# Patient Record
Sex: Male | Born: 1940
Health system: Southern US, Community
[De-identification: ages and names within clinical notes are randomized; demographics above are authoritative.]

## PROBLEM LIST (undated history)

## (undated) DIAGNOSIS — I341 Nonrheumatic mitral (valve) prolapse: Secondary | ICD-10-CM

## (undated) DIAGNOSIS — Z87898 Personal history of other specified conditions: Secondary | ICD-10-CM

## (undated) DIAGNOSIS — F32A Depression, unspecified: Secondary | ICD-10-CM

## (undated) DIAGNOSIS — L732 Hidradenitis suppurativa: Secondary | ICD-10-CM

## (undated) DIAGNOSIS — Z973 Presence of spectacles and contact lenses: Secondary | ICD-10-CM

## (undated) DIAGNOSIS — K219 Gastro-esophageal reflux disease without esophagitis: Secondary | ICD-10-CM

## (undated) DIAGNOSIS — Z87438 Personal history of other diseases of male genital organs: Secondary | ICD-10-CM

## (undated) DIAGNOSIS — H269 Unspecified cataract: Secondary | ICD-10-CM

## (undated) DIAGNOSIS — D126 Benign neoplasm of colon, unspecified: Secondary | ICD-10-CM

## (undated) DIAGNOSIS — K589 Irritable bowel syndrome without diarrhea: Secondary | ICD-10-CM

## (undated) DIAGNOSIS — J189 Pneumonia, unspecified organism: Secondary | ICD-10-CM

## (undated) DIAGNOSIS — Z972 Presence of dental prosthetic device (complete) (partial): Secondary | ICD-10-CM

## (undated) DIAGNOSIS — I872 Venous insufficiency (chronic) (peripheral): Secondary | ICD-10-CM

## (undated) DIAGNOSIS — I050 Rheumatic mitral stenosis: Secondary | ICD-10-CM

## (undated) DIAGNOSIS — C801 Malignant (primary) neoplasm, unspecified: Secondary | ICD-10-CM

## (undated) DIAGNOSIS — L24A9 Irritant contact dermatitis due friction or contact with other specified body fluids: Secondary | ICD-10-CM

## (undated) DIAGNOSIS — E119 Type 2 diabetes mellitus without complications: Secondary | ICD-10-CM

## (undated) DIAGNOSIS — E669 Obesity, unspecified: Secondary | ICD-10-CM

## (undated) DIAGNOSIS — G4730 Sleep apnea, unspecified: Secondary | ICD-10-CM

## (undated) DIAGNOSIS — K5730 Diverticulosis of large intestine without perforation or abscess without bleeding: Secondary | ICD-10-CM

## (undated) DIAGNOSIS — R519 Headache, unspecified: Secondary | ICD-10-CM

## (undated) DIAGNOSIS — M869 Osteomyelitis, unspecified: Secondary | ICD-10-CM

## (undated) DIAGNOSIS — M1990 Unspecified osteoarthritis, unspecified site: Secondary | ICD-10-CM

## (undated) DIAGNOSIS — F419 Anxiety disorder, unspecified: Secondary | ICD-10-CM

## (undated) DIAGNOSIS — R413 Other amnesia: Secondary | ICD-10-CM

## (undated) DIAGNOSIS — F329 Major depressive disorder, single episode, unspecified: Secondary | ICD-10-CM

## (undated) DIAGNOSIS — J45909 Unspecified asthma, uncomplicated: Secondary | ICD-10-CM

## (undated) DIAGNOSIS — T148XXA Other injury of unspecified body region, initial encounter: Secondary | ICD-10-CM

## (undated) DIAGNOSIS — H409 Unspecified glaucoma: Secondary | ICD-10-CM

## (undated) DIAGNOSIS — E785 Hyperlipidemia, unspecified: Secondary | ICD-10-CM

## (undated) DIAGNOSIS — J449 Chronic obstructive pulmonary disease, unspecified: Secondary | ICD-10-CM

## (undated) HISTORY — DX: Chronic obstructive pulmonary disease, unspecified: J44.9

## (undated) HISTORY — DX: Anxiety disorder, unspecified: F41.9

## (undated) HISTORY — DX: Unspecified asthma, uncomplicated: J45.909

## (undated) HISTORY — DX: Diverticulosis of large intestine without perforation or abscess without bleeding: K57.30

## (undated) HISTORY — DX: Gastro-esophageal reflux disease without esophagitis: K21.9

## (undated) HISTORY — DX: Benign neoplasm of colon, unspecified: D12.6

## (undated) HISTORY — PX: CIRCUMCISION: SHX1350

## (undated) HISTORY — PX: COLON SURGERY: SHX602

## (undated) HISTORY — PX: TONSILLECTOMY: SUR1361

## (undated) HISTORY — DX: Other amnesia: R41.3

## (undated) HISTORY — DX: Hidradenitis suppurativa: L73.2

## (undated) HISTORY — DX: Hyperlipidemia, unspecified: E78.5

## (undated) HISTORY — DX: Venous insufficiency (chronic) (peripheral): I87.2

## (undated) HISTORY — DX: Unspecified glaucoma: H40.9

## (undated) HISTORY — DX: Personal history of other diseases of male genital organs: Z87.438

## (undated) HISTORY — PX: MULTIPLE TOOTH EXTRACTIONS: SHX2053

## (undated) HISTORY — DX: Personal history of other specified conditions: Z87.898

## (undated) HISTORY — DX: Irritable bowel syndrome, unspecified: K58.9

## (undated) HISTORY — PX: KNEE ARTHROSCOPY: SHX127

## (undated) HISTORY — DX: Type 2 diabetes mellitus without complications: E11.9

## (undated) HISTORY — DX: Unspecified osteoarthritis, unspecified site: M19.90

## (undated) HISTORY — DX: Obesity, unspecified: E66.9

---
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
## 2001-04-20 ENCOUNTER — Encounter: Payer: Self-pay | Admitting: Orthopedic Surgery

## 2001-04-20 ENCOUNTER — Ambulatory Visit (HOSPITAL_COMMUNITY): Admission: RE | Admit: 2001-04-20 | Discharge: 2001-04-20 | Payer: Self-pay | Admitting: Orthopedic Surgery

## 2003-11-29 ENCOUNTER — Emergency Department (HOSPITAL_COMMUNITY): Admission: AD | Admit: 2003-11-29 | Discharge: 2003-11-29 | Payer: Self-pay | Admitting: Family Medicine

## 2004-11-10 ENCOUNTER — Ambulatory Visit: Payer: Self-pay | Admitting: Pulmonary Disease

## 2004-12-07 ENCOUNTER — Ambulatory Visit: Payer: Self-pay | Admitting: Professional

## 2004-12-08 ENCOUNTER — Ambulatory Visit: Payer: Self-pay | Admitting: Pulmonary Disease

## 2004-12-14 ENCOUNTER — Ambulatory Visit: Payer: Self-pay | Admitting: Professional

## 2004-12-21 ENCOUNTER — Ambulatory Visit: Payer: Self-pay | Admitting: Professional

## 2004-12-28 ENCOUNTER — Ambulatory Visit: Payer: Self-pay | Admitting: Professional

## 2004-12-30 ENCOUNTER — Ambulatory Visit: Payer: Self-pay | Admitting: Gastroenterology

## 2005-01-04 ENCOUNTER — Ambulatory Visit: Payer: Self-pay | Admitting: Professional

## 2005-01-04 ENCOUNTER — Encounter: Admission: RE | Admit: 2005-01-04 | Discharge: 2005-04-04 | Payer: Self-pay | Admitting: Pulmonary Disease

## 2005-01-11 ENCOUNTER — Ambulatory Visit: Payer: Self-pay | Admitting: Professional

## 2005-01-13 ENCOUNTER — Ambulatory Visit: Payer: Self-pay | Admitting: Gastroenterology

## 2005-01-18 ENCOUNTER — Ambulatory Visit: Payer: Self-pay | Admitting: Professional

## 2005-01-25 ENCOUNTER — Ambulatory Visit: Payer: Self-pay | Admitting: Professional

## 2005-02-02 ENCOUNTER — Ambulatory Visit: Payer: Self-pay | Admitting: Pulmonary Disease

## 2005-02-08 ENCOUNTER — Ambulatory Visit: Payer: Self-pay | Admitting: Professional

## 2005-02-22 ENCOUNTER — Ambulatory Visit: Payer: Self-pay | Admitting: Professional

## 2005-03-08 ENCOUNTER — Ambulatory Visit: Payer: Self-pay | Admitting: Professional

## 2005-04-29 ENCOUNTER — Encounter: Admission: RE | Admit: 2005-04-29 | Discharge: 2005-07-28 | Payer: Self-pay | Admitting: Pulmonary Disease

## 2005-06-03 ENCOUNTER — Ambulatory Visit: Payer: Self-pay | Admitting: Pulmonary Disease

## 2005-09-06 ENCOUNTER — Ambulatory Visit: Payer: Self-pay | Admitting: Pulmonary Disease

## 2005-09-08 ENCOUNTER — Encounter: Admission: RE | Admit: 2005-09-08 | Discharge: 2005-09-08 | Payer: Self-pay | Admitting: Family Medicine

## 2005-12-16 ENCOUNTER — Ambulatory Visit: Payer: Self-pay | Admitting: Pulmonary Disease

## 2005-12-20 ENCOUNTER — Ambulatory Visit: Payer: Self-pay | Admitting: Pulmonary Disease

## 2006-06-10 ENCOUNTER — Ambulatory Visit: Payer: Self-pay | Admitting: Pulmonary Disease

## 2006-11-09 ENCOUNTER — Ambulatory Visit: Payer: Self-pay | Admitting: Pulmonary Disease

## 2007-08-02 ENCOUNTER — Ambulatory Visit: Payer: Self-pay | Admitting: Pulmonary Disease

## 2007-08-10 ENCOUNTER — Ambulatory Visit: Payer: Self-pay | Admitting: Pulmonary Disease

## 2007-08-10 LAB — CONVERTED CEMR LAB
ALT: 38 units/L (ref 0–53)
Alkaline Phosphatase: 36 units/L — ABNORMAL LOW (ref 39–117)
BUN: 14 mg/dL (ref 6–23)
Basophils Relative: 0.5 % (ref 0.0–1.0)
Bilirubin, Direct: 0.2 mg/dL (ref 0.0–0.3)
Calcium: 9.5 mg/dL (ref 8.4–10.5)
Cholesterol: 145 mg/dL (ref 0–200)
Eosinophils Absolute: 0.2 10*3/uL (ref 0.0–0.6)
Eosinophils Relative: 7.7 % — ABNORMAL HIGH (ref 0.0–5.0)
GFR calc Af Amer: 86 mL/min
GFR calc non Af Amer: 71 mL/min
HCT: 39.6 % (ref 39.0–52.0)
Hemoglobin: 13.6 g/dL (ref 13.0–17.0)
Hgb A1c MFr Bld: 6 % (ref 4.6–6.0)
LDL Cholesterol: 88 mg/dL (ref 0–99)
Microalb Creat Ratio: 8.1 mg/g (ref 0.0–30.0)
Microalb, Ur: 1 mg/dL (ref 0.0–1.9)
Neutro Abs: 2.2 10*3/uL (ref 1.4–7.7)
Neutrophils Relative %: 57.6 % (ref 43.0–77.0)
RBC: 4.19 M/uL — ABNORMAL LOW (ref 4.22–5.81)
RDW: 11.5 % (ref 11.5–14.6)
TSH: 1.06 microintl units/mL (ref 0.35–5.50)
Total CHOL/HDL Ratio: 3.1
WBC: 3.7 10*3/uL — ABNORMAL LOW (ref 4.5–10.5)

## 2007-10-24 ENCOUNTER — Ambulatory Visit: Payer: Self-pay | Admitting: Pulmonary Disease

## 2008-01-26 DIAGNOSIS — Z87898 Personal history of other specified conditions: Secondary | ICD-10-CM | POA: Insufficient documentation

## 2008-01-26 DIAGNOSIS — I70209 Unspecified atherosclerosis of native arteries of extremities, unspecified extremity: Secondary | ICD-10-CM

## 2008-01-26 DIAGNOSIS — E785 Hyperlipidemia, unspecified: Secondary | ICD-10-CM | POA: Insufficient documentation

## 2008-01-26 DIAGNOSIS — K589 Irritable bowel syndrome without diarrhea: Secondary | ICD-10-CM

## 2008-01-26 DIAGNOSIS — E1151 Type 2 diabetes mellitus with diabetic peripheral angiopathy without gangrene: Secondary | ICD-10-CM

## 2008-01-26 DIAGNOSIS — I059 Rheumatic mitral valve disease, unspecified: Secondary | ICD-10-CM

## 2008-01-26 DIAGNOSIS — D126 Benign neoplasm of colon, unspecified: Secondary | ICD-10-CM | POA: Insufficient documentation

## 2008-01-26 DIAGNOSIS — I872 Venous insufficiency (chronic) (peripheral): Secondary | ICD-10-CM | POA: Insufficient documentation

## 2008-01-26 DIAGNOSIS — K219 Gastro-esophageal reflux disease without esophagitis: Secondary | ICD-10-CM

## 2008-02-20 ENCOUNTER — Ambulatory Visit: Payer: Self-pay | Admitting: Pulmonary Disease

## 2008-02-20 DIAGNOSIS — M1990 Unspecified osteoarthritis, unspecified site: Secondary | ICD-10-CM | POA: Insufficient documentation

## 2008-02-20 DIAGNOSIS — J329 Chronic sinusitis, unspecified: Secondary | ICD-10-CM | POA: Insufficient documentation

## 2008-02-20 DIAGNOSIS — K5730 Diverticulosis of large intestine without perforation or abscess without bleeding: Secondary | ICD-10-CM | POA: Insufficient documentation

## 2008-04-26 ENCOUNTER — Telehealth: Payer: Self-pay | Admitting: Pulmonary Disease

## 2008-05-21 ENCOUNTER — Emergency Department (HOSPITAL_COMMUNITY): Admission: EM | Admit: 2008-05-21 | Discharge: 2008-05-21 | Payer: Self-pay | Admitting: Emergency Medicine

## 2008-05-23 ENCOUNTER — Telehealth (INDEPENDENT_AMBULATORY_CARE_PROVIDER_SITE_OTHER): Payer: Self-pay | Admitting: *Deleted

## 2008-05-24 ENCOUNTER — Ambulatory Visit: Payer: Self-pay | Admitting: Pulmonary Disease

## 2008-06-14 ENCOUNTER — Ambulatory Visit: Payer: Self-pay | Admitting: Pulmonary Disease

## 2008-08-06 ENCOUNTER — Ambulatory Visit: Payer: Self-pay | Admitting: Pulmonary Disease

## 2008-08-07 ENCOUNTER — Ambulatory Visit: Payer: Self-pay | Admitting: Pulmonary Disease

## 2008-08-07 ENCOUNTER — Encounter: Admission: RE | Admit: 2008-08-07 | Discharge: 2008-08-07 | Payer: Self-pay | Admitting: Pulmonary Disease

## 2008-08-09 ENCOUNTER — Telehealth (INDEPENDENT_AMBULATORY_CARE_PROVIDER_SITE_OTHER): Payer: Self-pay | Admitting: *Deleted

## 2008-08-13 LAB — CONVERTED CEMR LAB
ALT: 38 units/L (ref 0–53)
AST: 27 units/L (ref 0–37)
Albumin: 3.9 g/dL (ref 3.5–5.2)
BUN: 16 mg/dL (ref 6–23)
Basophils Absolute: 0 10*3/uL (ref 0.0–0.1)
Basophils Relative: 0.6 % (ref 0.0–3.0)
CO2: 30 meq/L (ref 19–32)
Calcium: 9.4 mg/dL (ref 8.4–10.5)
Chloride: 108 meq/L (ref 96–112)
Cholesterol: 144 mg/dL (ref 0–200)
Creatinine, Ser: 1.1 mg/dL (ref 0.4–1.5)
Eosinophils Relative: 8.4 % — ABNORMAL HIGH (ref 0.0–5.0)
Glucose, Bld: 144 mg/dL — ABNORMAL HIGH (ref 70–99)
Hemoglobin: 13.9 g/dL (ref 13.0–17.0)
Hgb A1c MFr Bld: 6.1 % — ABNORMAL HIGH (ref 4.6–6.0)
LDL Cholesterol: 83 mg/dL (ref 0–99)
Lymphocytes Relative: 28.5 % (ref 12.0–46.0)
MCHC: 35.6 g/dL (ref 30.0–36.0)
Neutro Abs: 2.3 10*3/uL (ref 1.4–7.7)
PSA: 0.33 ng/mL (ref 0.10–4.00)
RBC: 4.19 M/uL — ABNORMAL LOW (ref 4.22–5.81)
Total Bilirubin: 1.2 mg/dL (ref 0.3–1.2)
Total Protein: 7.2 g/dL (ref 6.0–8.3)
WBC: 4.3 10*3/uL — ABNORMAL LOW (ref 4.5–10.5)

## 2008-09-09 ENCOUNTER — Encounter: Payer: Self-pay | Admitting: Pulmonary Disease

## 2008-09-13 ENCOUNTER — Encounter: Payer: Self-pay | Admitting: Pulmonary Disease

## 2008-10-23 ENCOUNTER — Telehealth (INDEPENDENT_AMBULATORY_CARE_PROVIDER_SITE_OTHER): Payer: Self-pay | Admitting: *Deleted

## 2008-12-04 ENCOUNTER — Telehealth (INDEPENDENT_AMBULATORY_CARE_PROVIDER_SITE_OTHER): Payer: Self-pay | Admitting: *Deleted

## 2008-12-04 ENCOUNTER — Encounter: Payer: Self-pay | Admitting: Pulmonary Disease

## 2009-01-27 ENCOUNTER — Telehealth (INDEPENDENT_AMBULATORY_CARE_PROVIDER_SITE_OTHER): Payer: Self-pay | Admitting: *Deleted

## 2009-01-28 ENCOUNTER — Ambulatory Visit (HOSPITAL_COMMUNITY): Admission: RE | Admit: 2009-01-28 | Discharge: 2009-01-28 | Payer: Self-pay | Admitting: Pulmonary Disease

## 2009-01-28 ENCOUNTER — Ambulatory Visit: Payer: Self-pay | Admitting: Internal Medicine

## 2009-01-28 DIAGNOSIS — L03119 Cellulitis of unspecified part of limb: Secondary | ICD-10-CM

## 2009-01-28 DIAGNOSIS — L02419 Cutaneous abscess of limb, unspecified: Secondary | ICD-10-CM

## 2009-01-29 ENCOUNTER — Encounter: Payer: Self-pay | Admitting: Adult Health

## 2009-01-29 LAB — CONVERTED CEMR LAB
BUN: 11 mg/dL (ref 6–23)
Creatinine, Ser: 1.2 mg/dL (ref 0.4–1.5)
GFR calc non Af Amer: 77.7 mL/min (ref >60)
Glucose, Bld: 147 mg/dL — ABNORMAL HIGH (ref 70–99)
Hgb A1c MFr Bld: 6.9 % — ABNORMAL HIGH (ref 4.6–6.5)

## 2009-02-03 ENCOUNTER — Ambulatory Visit: Payer: Self-pay | Admitting: Pulmonary Disease

## 2009-02-09 DIAGNOSIS — J189 Pneumonia, unspecified organism: Secondary | ICD-10-CM | POA: Insufficient documentation

## 2009-02-09 DIAGNOSIS — N62 Hypertrophy of breast: Secondary | ICD-10-CM

## 2009-02-10 ENCOUNTER — Telehealth: Payer: Self-pay | Admitting: Pulmonary Disease

## 2009-02-25 ENCOUNTER — Encounter (HOSPITAL_BASED_OUTPATIENT_CLINIC_OR_DEPARTMENT_OTHER): Admission: RE | Admit: 2009-02-25 | Discharge: 2009-05-14 | Payer: Self-pay | Admitting: Internal Medicine

## 2009-03-24 ENCOUNTER — Emergency Department (HOSPITAL_COMMUNITY): Admission: EM | Admit: 2009-03-24 | Discharge: 2009-03-24 | Payer: Self-pay | Admitting: Family Medicine

## 2009-04-10 ENCOUNTER — Ambulatory Visit: Payer: Self-pay | Admitting: *Deleted

## 2009-04-16 ENCOUNTER — Ambulatory Visit (HOSPITAL_COMMUNITY): Admission: RE | Admit: 2009-04-16 | Discharge: 2009-04-16 | Payer: Self-pay | Admitting: Internal Medicine

## 2009-05-01 ENCOUNTER — Telehealth (INDEPENDENT_AMBULATORY_CARE_PROVIDER_SITE_OTHER): Payer: Self-pay | Admitting: *Deleted

## 2009-05-15 ENCOUNTER — Encounter (HOSPITAL_BASED_OUTPATIENT_CLINIC_OR_DEPARTMENT_OTHER): Admission: RE | Admit: 2009-05-15 | Discharge: 2009-06-17 | Payer: Self-pay | Admitting: Internal Medicine

## 2009-05-28 ENCOUNTER — Ambulatory Visit: Payer: Self-pay | Admitting: Vascular Surgery

## 2009-05-29 ENCOUNTER — Ambulatory Visit (HOSPITAL_COMMUNITY): Admission: RE | Admit: 2009-05-29 | Discharge: 2009-05-29 | Payer: Self-pay | Admitting: Internal Medicine

## 2009-06-11 ENCOUNTER — Telehealth: Payer: Self-pay | Admitting: Pulmonary Disease

## 2009-06-11 ENCOUNTER — Encounter: Payer: Self-pay | Admitting: Pulmonary Disease

## 2009-06-12 ENCOUNTER — Ambulatory Visit: Payer: Self-pay | Admitting: Pulmonary Disease

## 2009-06-15 HISTORY — PX: LASER ABLATION: SHX1947

## 2009-06-15 HISTORY — PX: TOE AMPUTATION: SHX809

## 2009-06-16 ENCOUNTER — Ambulatory Visit (HOSPITAL_COMMUNITY): Admission: RE | Admit: 2009-06-16 | Discharge: 2009-06-17 | Payer: Self-pay | Admitting: Orthopedic Surgery

## 2009-06-18 ENCOUNTER — Encounter (HOSPITAL_BASED_OUTPATIENT_CLINIC_OR_DEPARTMENT_OTHER): Admission: RE | Admit: 2009-06-18 | Discharge: 2009-09-16 | Payer: Self-pay | Admitting: Internal Medicine

## 2009-06-18 ENCOUNTER — Encounter: Payer: Self-pay | Admitting: Pulmonary Disease

## 2009-07-02 ENCOUNTER — Ambulatory Visit: Payer: Self-pay | Admitting: Vascular Surgery

## 2009-07-07 ENCOUNTER — Telehealth: Payer: Self-pay | Admitting: Pulmonary Disease

## 2009-07-09 ENCOUNTER — Ambulatory Visit: Payer: Self-pay | Admitting: Vascular Surgery

## 2009-07-10 ENCOUNTER — Ambulatory Visit (HOSPITAL_BASED_OUTPATIENT_CLINIC_OR_DEPARTMENT_OTHER): Admission: RE | Admit: 2009-07-10 | Discharge: 2009-07-10 | Payer: Self-pay | Admitting: Urology

## 2009-07-11 ENCOUNTER — Encounter: Payer: Self-pay | Admitting: Pulmonary Disease

## 2009-08-04 ENCOUNTER — Ambulatory Visit: Payer: Self-pay | Admitting: Pulmonary Disease

## 2009-08-05 LAB — CONVERTED CEMR LAB
AST: 25 units/L (ref 0–37)
Albumin: 3.9 g/dL (ref 3.5–5.2)
Alkaline Phosphatase: 46 units/L (ref 39–117)
Bilirubin, Direct: 0.2 mg/dL (ref 0.0–0.3)
CO2: 31 meq/L (ref 19–32)
Calcium: 9.3 mg/dL (ref 8.4–10.5)
GFR calc non Af Amer: 85.77 mL/min (ref >60)
Glucose, Bld: 154 mg/dL — ABNORMAL HIGH (ref 70–99)
Potassium: 4.4 meq/L (ref 3.5–5.1)
Sodium: 141 meq/L (ref 135–145)
Total CHOL/HDL Ratio: 3
Total Protein: 8.2 g/dL (ref 6.0–8.3)

## 2009-08-18 ENCOUNTER — Encounter: Payer: Self-pay | Admitting: Pulmonary Disease

## 2009-09-16 ENCOUNTER — Encounter: Payer: Self-pay | Admitting: Pulmonary Disease

## 2009-09-30 ENCOUNTER — Encounter: Payer: Self-pay | Admitting: Pulmonary Disease

## 2009-12-23 ENCOUNTER — Encounter (INDEPENDENT_AMBULATORY_CARE_PROVIDER_SITE_OTHER): Payer: Self-pay | Admitting: *Deleted

## 2010-01-15 ENCOUNTER — Encounter: Payer: Self-pay | Admitting: Pulmonary Disease

## 2010-01-26 ENCOUNTER — Encounter: Payer: Self-pay | Admitting: Pulmonary Disease

## 2010-01-27 ENCOUNTER — Telehealth: Payer: Self-pay | Admitting: Pulmonary Disease

## 2010-01-28 ENCOUNTER — Telehealth (INDEPENDENT_AMBULATORY_CARE_PROVIDER_SITE_OTHER): Payer: Self-pay | Admitting: *Deleted

## 2010-02-09 ENCOUNTER — Telehealth: Payer: Self-pay | Admitting: Pulmonary Disease

## 2010-02-10 ENCOUNTER — Encounter: Payer: Self-pay | Admitting: Pulmonary Disease

## 2010-02-11 ENCOUNTER — Ambulatory Visit: Payer: Self-pay | Admitting: Pulmonary Disease

## 2010-02-11 ENCOUNTER — Encounter (INDEPENDENT_AMBULATORY_CARE_PROVIDER_SITE_OTHER): Payer: Self-pay | Admitting: *Deleted

## 2010-02-18 LAB — CONVERTED CEMR LAB
AST: 27 units/L (ref 0–37)
Albumin: 4 g/dL (ref 3.5–5.2)
Alkaline Phosphatase: 44 units/L (ref 39–117)
BUN: 12 mg/dL (ref 6–23)
Basophils Absolute: 0 10*3/uL (ref 0.0–0.1)
CO2: 28 meq/L (ref 19–32)
Eosinophils Relative: 6.2 % — ABNORMAL HIGH (ref 0.0–5.0)
GFR calc non Af Amer: 107.95 mL/min (ref >60)
Glucose, Bld: 192 mg/dL — ABNORMAL HIGH (ref 70–99)
Hgb A1c MFr Bld: 7.5 % — ABNORMAL HIGH (ref 4.6–6.5)
Lymphocytes Relative: 24.5 % (ref 12.0–46.0)
Monocytes Relative: 8.5 % (ref 3.0–12.0)
Neutrophils Relative %: 59.9 % (ref 43.0–77.0)
Platelets: 149 10*3/uL — ABNORMAL LOW (ref 150.0–400.0)
Potassium: 3.9 meq/L (ref 3.5–5.1)
RDW: 11.7 % (ref 11.5–14.6)
Sodium: 140 meq/L (ref 135–145)
TSH: 1.31 microintl units/mL (ref 0.35–5.50)
Total Protein: 8 g/dL (ref 6.0–8.3)
WBC: 4.8 10*3/uL (ref 4.5–10.5)

## 2010-03-23 ENCOUNTER — Encounter (INDEPENDENT_AMBULATORY_CARE_PROVIDER_SITE_OTHER): Payer: Self-pay

## 2010-03-24 ENCOUNTER — Ambulatory Visit: Payer: Self-pay | Admitting: Gastroenterology

## 2010-03-24 ENCOUNTER — Encounter (INDEPENDENT_AMBULATORY_CARE_PROVIDER_SITE_OTHER): Payer: Self-pay

## 2010-04-07 ENCOUNTER — Ambulatory Visit: Payer: Self-pay | Admitting: Gastroenterology

## 2010-04-07 LAB — HM COLONOSCOPY

## 2010-04-09 ENCOUNTER — Encounter: Payer: Self-pay | Admitting: Gastroenterology

## 2010-04-30 ENCOUNTER — Telehealth (INDEPENDENT_AMBULATORY_CARE_PROVIDER_SITE_OTHER): Payer: Self-pay | Admitting: *Deleted

## 2010-06-18 ENCOUNTER — Encounter: Payer: Self-pay | Admitting: Pulmonary Disease

## 2010-07-23 ENCOUNTER — Telehealth: Payer: Self-pay | Admitting: Pulmonary Disease

## 2010-08-11 ENCOUNTER — Ambulatory Visit: Payer: Self-pay | Admitting: Pulmonary Disease

## 2010-08-16 LAB — CONVERTED CEMR LAB
AST: 27 units/L (ref 0–37)
Alkaline Phosphatase: 36 units/L — ABNORMAL LOW (ref 39–117)
BUN: 13 mg/dL (ref 6–23)
Bilirubin, Direct: 0.2 mg/dL (ref 0.0–0.3)
Chloride: 104 meq/L (ref 96–112)
GFR calc non Af Amer: 107.79 mL/min (ref >60)
Hgb A1c MFr Bld: 5.8 % (ref 4.6–6.5)
Potassium: 4.4 meq/L (ref 3.5–5.1)
Sodium: 140 meq/L (ref 135–145)
Total Bilirubin: 1.2 mg/dL (ref 0.3–1.2)

## 2010-08-19 ENCOUNTER — Encounter: Payer: Self-pay | Admitting: Pulmonary Disease

## 2010-08-19 ENCOUNTER — Ambulatory Visit: Payer: Self-pay

## 2010-08-19 ENCOUNTER — Ambulatory Visit: Payer: Self-pay | Admitting: Cardiovascular Disease

## 2010-08-19 ENCOUNTER — Ambulatory Visit (HOSPITAL_COMMUNITY): Admission: RE | Admit: 2010-08-19 | Discharge: 2010-08-19 | Payer: Self-pay | Admitting: Pulmonary Disease

## 2010-08-26 ENCOUNTER — Encounter: Payer: Self-pay | Admitting: Pulmonary Disease

## 2010-09-17 ENCOUNTER — Telehealth (INDEPENDENT_AMBULATORY_CARE_PROVIDER_SITE_OTHER): Payer: Self-pay | Admitting: *Deleted

## 2010-09-21 ENCOUNTER — Encounter: Payer: Self-pay | Admitting: Pulmonary Disease

## 2010-12-06 ENCOUNTER — Encounter: Payer: Self-pay | Admitting: Pulmonary Disease

## 2010-12-15 NOTE — Letter (Signed)
Summary: Previsit letter  [facility]  [address] [place], [place] [address]   Phone: [phone]  Fax: [phone]       02/11/2010 MRN: [id number]  [facility] [address] [place], [place]  [address]  Dear [name],  Welcome to the Gastroenterology Division at [facility].    You are scheduled to see a nurse for your pre-procedure visit on 03-24-10 at 11:00a.m. on the 3rd floor at [facility], [address]  We ask that you try to arrive at our office 15 minutes prior to your appointment time to allow for check-in.  Your nurse visit will consist of discussing your medical and surgical history, your immediate family medical history, and your medications.    Please bring a complete list of all your medications or, if you prefer, bring the medication bottles and we will list them.  We will need to be aware of both prescribed and over the counter drugs.  We will need to know exact dosage information as well.  If you are on blood thinners (Coumadin, Plavix, Aggrenox, Ticlid, etc.) please call our office today/prior to your appointment, as we need to consult with your physician about holding your medication.   Please be prepared to read and sign documents such as consent forms, a financial agreement, and acknowledgement forms.  If necessary, and with your consent, a friend or relative is welcome to sit-in on the nurse visit with you.  Please bring your insurance card so that we may make a copy of it.  If your insurance requires a referral to see a specialist, please bring your referral form from your primary care physician.  No co-pay is required for this nurse visit.     If you cannot keep your appointment, please call [phone] to cancel or reschedule prior to your appointment date.  This allows [place] the opportunity to schedule an appointment for another patient in need of care.    Thank you for choosing [facility] for your medical needs.   We appreciate the opportunity to care for you.  Please visit [place] at our website  to learn more about our practice.                     Sincerely.                                                                                                                   The Gastroenterology Division

## 2010-12-15 NOTE — Assessment & Plan Note (Signed)
Summary: rov ///kp   CC:  6 month ROV & review of mult medical problems....  History of Present Illness: 70 y/o BM here for a follow up visit... he has mult med problems as noted below...   ~  Mar10:  he was seen w/ cellulitis left leg recently and cult= MRSA... XRays of left Tib/Fib were neg... treated w/ Keflex & Doxy w/ improvement, but wound edges are undermined and he will need wound center attention... we will give him a Tetanus shot and refer to wound clinic.  ~  Aug10:  4+months of intensive Rx thru wound care clinic w/ hyperbaric O2 Rx etc (notes reviewed from [facility])... right foot second toe osteomyelitis- amputation by Volney Presser 8/2... and s/p laser ablation of left greater saphenous vein by DrEarly 8/25... also had phimosis w/ circ 8/26 by DrTannenbaum...  ~  Sep10:  we reviewed all of his surgeries in Aug- he notes much improved now... awaiting special DM shoes... feeling well now- no new complaints or concerns...   ~  February 11, 2010:  he reports a good 51mo- had f/u DrTannenbaum- s/p circ, BPH, ED, norm PSA;  and DrShapiro- no retinopathy...  still having trouble w/ diet, exercise, and weight...  BS not well controlled (BS=192, A1c=7.5) and we decided to add Glimep 1mg /d...    Current Problem List:  Hx of SINUSITIS (ICD-473.9)  Hx Pneumonia - seen in ER 7/09 w/ faint RLL infiltrate that cleared serially w/ antibiotic Rx...  MITRAL VALVE PROLAPSE (ICD-424.0) - on ASA 81mg /d... he denies CP, palpit, dyspnea... exam w/ MR murmur... we discussed f/u 2DEcho.  ~  2DEcho 5/01 showed mild ant leaflet MVP, mild MR, norm LVF...  VENOUS INSUFFICIENCY (ICD-459.81) - he knows to elim salt, elevate legs, wear TED's...  ~  neg ven dopplers in 1996...   ~  he saw DrLawson in 2000 for a ven stasis ulcer on his leg... rec for TED's, no salt, elevation, etc...   ~  he had normal ABI's in 10/06...  ~  3/10:  left leg wound w/ cellulitis & +MRSA... Rx'd and refer back to wound care clinic.  ~   7/10:  sched for right second toe amputation due to osteomyelitis despite hyperbaric O2 etc...  ~  8/10: s/p right second toe amp for osteomyelitis Lestine Box), and s/p laser ablation of left greater saphenous vein (DrEarly).  HYPERCHOLESTEROLEMIA (ICD-272.0) - changed to SIMVASTATIN 80mg /d last yr... due for f/u FLP.  ~  FLP 9/08 showed TChol 145, TG 54, HDL 46, LDL 88  ~  FLP 9/09 showed TChol 144, Tg 65, HDL 48, LDL 83  ~  FLP 9/10 showed TChol 146, TG 52, HDL 51, LDL 85  DIABETES MELLITUS (ICD-250.00) - on METFORMIN 500mg  Bid + diet...  ~  labs 9/08 showed BS=123, HgA1c=6.0.Marland Kitchen. urine microalb was neg...  ~  labs 9/09 showed BS= 144, HgA1c= 6.1.Marland Kitchen. rec> same med, better diet.  ~  labs 3/10 showed BS= 147, HgA1c= 6.9.Marland Kitchen. rec> incr the MetformBid, but he never did.  ~  7/10: he states the wound care clinic increased his glucose intake due to Hyperbaric O2 Rx... we increased his Metform500 Bid.  ~  labs 9/10 on Metform500Bid showed BS= 154, A1c= 6.4  ~  labs 3/11 on Metform500Bid showed BS= 192, A1c= 7.5.Marland Kitchen. rec> add Glimep 1mg /d.  OBESITY (ICD-278.00) - he is very large at 6\' 8"  tall and >300# for the last 10+ yrs... peak = 340#, currently 333#... he drinks beer daily... discussed diet +  exercise program (and no beer)...  ~  he had transient right gynecomastia 9/09, no discharge, neg mammogram & resolved on it's own...  GERD (ICD-530.81)  DIVERTICULOSIS OF COLON (ICD-562.10) / IRRITABLE BOWEL SYNDROME (ICD-564.1) & COLONIC POLYPS (ICD-211.3) -  - last colonoscopy was 3/06 by DrStark showing divertics, & 2-25mm polyps (adenomas)... f/u planned 73yrs, due now...  BENIGN PROSTATIC HYPERTROPHY, HX OF (ICD-V13.8) - eval by DrTannenbaum 7/08 and Rx w/ Cialis... he has tried the PEP shots... + fam hx prostate ca in his father...  ~  8/10:  DrTannenbaum did circ for phimosis, pt states that PSA check was OK...  DEGENERATIVE JOINT DISEASE (ICD-715.90) - he uses OTC meds Prn, and was rec to start Vit  supplements by DrTannenbaum- takes MVI, VitC, VitD... he has seen DrHilts in the past...  Hx of HIDRADENITIS (ICD-705.83)  Health Maintenance - up to date on colon & prostate screens... PNEUMOVAX 9/09... TETANUS booster 3/10... gets yearly Flu vaccines in Fall.   Allergies (verified): No Known Drug Allergies  Comments:  Nurse/Medical Assistant: The patient's medications and allergies were reviewed with the patient and were updated in the Medication and Allergy Lists.  Past History:  Past Medical History:  Hx of SINUSITIS (ICD-473.9) Hx of PNEUMONIA (ICD-486) MITRAL VALVE PROLAPSE (ICD-424.0) VENOUS INSUFFICIENCY (ICD-459.81) HYPERCHOLESTEROLEMIA (ICD-272.0) DIABETES MELLITUS (ICD-250.00) OBESITY (ICD-278.00) GERD (ICD-530.81) DIVERTICULOSIS OF COLON (ICD-562.10) IRRITABLE BOWEL SYNDROME (ICD-564.1) COLONIC POLYPS (ICD-211.3) BENIGN PROSTATIC HYPERTROPHY, HX OF (ICD-V13.8) Hx of GYNECOMASTIA, UNILATERAL (ICD-611.1) DEGENERATIVE JOINT DISEASE (ICD-715.90) Hx of HIDRADENITIS (ICD-705.83)  Past Surgical History: S/P knee arthroscopy S/P right foot second toe amputation by DrBednarz 8/10 S/P laser ablation of left greater saphenous vein by DrEarly 8/10 S/P circ for phimosis 8/10 by DrTannenbaum  Family History: Reviewed history from 06/12/2009 and no changes required. mother alive age 79 father deceased age 12 from stroke, DM  hx of prostate cancer 1 sibling deceased age 55 from brain tumor  Social History: Reviewed history from 06/12/2009 and no changes required. married 2 children quit smoking 1973  smoked for 12 years no caffeine use alcohol use---beer  Review of Systems      See HPI       The patient complains of dyspnea on exertion, peripheral edema, and difficulty walking.  The patient denies anorexia, fever, weight loss, weight gain, vision loss, decreased hearing, hoarseness, chest pain, syncope, prolonged cough, headaches, hemoptysis, abdominal pain,  melena, hematochezia, severe indigestion/heartburn, hematuria, incontinence, muscle weakness, suspicious skin lesions, transient blindness, depression, unusual weight change, abnormal bleeding, enlarged lymph nodes, and angioedema.    Vital Signs:  Patient profile:   71 year old male Height:      80 inches Weight:      333.25 pounds BMI:     36.74 O2 Sat:      96 % on Room air Temp:     96.7 degrees F oral Pulse rate:   75 / minute BP sitting:   110 / 72  (right arm) Cuff size:   regular  Vitals Entered By: Randell Loop CMA (February 11, 2010 10:45 AM)  O2 Sat at Rest %:  96 O2 Flow:  Room air CC: 6 month ROV & review of mult medical problems... Is Patient Diabetic? Yes Pain Assessment Patient in pain? no      Comments meds updated today   Physical Exam  Additional Exam:  WD, Overweight, very large 70 y/o BM in NAD... GENERAL:  Alert & oriented; pleasant & cooperative. HEENT:  [place]/AT, EOM-wnl, PERRLA, EACs-clear, TMs-wnl,  NOSE-clear, THROAT-clear & wnl. NECK:  Supple w/ fairROM; no JVD; normal carotid impulses w/o bruits; no thyromegaly or nodules palpated; no lymphadenopathy. CHEST:  Clear to P & A; without wheezes/ rales/ or rhonchi heard... HEART:  Regular Rhythm; gr 1/6 SEM without rubs or gallops detected... ABDOMEN:  Soft & nontender; normal bowel sounds; no organomegaly or masses detected. EXT: without deformities, mod arthritic changes; wearing TEDs +varicose veins/ +venous insuffic/ chr 1+ edema... NEURO:  CN's intact;  no focal neuro deficits...    MISC. Report  Procedure date:  02/11/2010  Findings:      BMP (METABOL)   Sodium                    140 mEq/L                   135-145   Potassium                 3.9 mEq/L                   3.5-5.1   Chloride                  103 mEq/L                   96-112   Carbon Dioxide            28 mEq/L                    19-32   Glucose              [H]  192 mg/dL                   16-10   BUN                       12  mg/dL                    9-60   Creatinine                0.9 mg/dL                   4.5-4.0   Calcium                   9.6 mg/dL                   9.8-11.9   GFR                       107.95 mL/min               >60  Hepatic/Liver Function Panel (HEPATIC)   Total Bilirubin           0.9 mg/dL                   1.4-7.8   Direct Bilirubin          0.1 mg/dL                   2.9-5.6   Alkaline Phosphatase      44 U/L                      39-117   AST  27 U/L                      0-37   ALT                       32 U/L                      0-53   Total Protein             8.0 g/dL                    1.6-1.0   Albumin                   4.0 g/dL                    9.6-0.4  CBC Platelet w/Diff (CBCD)   White Cell Count          4.8 K/uL                    4.5-10.5   Red Cell Count            4.27 Mil/uL                 4.22-5.81   Hemoglobin                13.4 g/dL                   54.0-98.1   Hematocrit                40.9 %                      39.0-52.0   MCV                       95.6 fl                     78.0-100.0   Platelet Count       [L]  149.0 K/uL                  150.0-400.0   Neutrophil %              59.9 %                      43.0-77.0   Lymphocyte %              24.5 %                      12.0-46.0   Monocyte %                8.5 %                       3.0-12.0   Eosinophils%         [H]  6.2 %                       0.0-5.0   Basophils %               0.9 %                       0.0-3.0  TSH (  TSH)   FastTSH                   1.31 uIU/mL                 0.35-5.50  Hemoglobin A1C (A1C)   Hemoglobin A1C       [H]  7.5 %      Impression & Recommendations:  Problem # 1:  CELLULITIS, LEG, LEFT (ICD-682.6) He has chr ven insuffic, hx cellulitis and osteomyelitis>>> s/p right 2nd toe amputation/ hyperbaric Rx/ etc...  Problem # 2:  DIABETES MELLITUS (ICD-250.00) A1c is up to 7.5 & we decided to add Glimep 1mg  Qam... His updated medication  list for this problem includes:    Adult Aspirin Low Strength 81 Mg Tbdp (Aspirin) .Marland Kitchen... Take 1 tablet by mouth once a day    Metformin Hcl 500 Mg Tabs (Metformin hcl) .Marland Kitchen... Take 1 tab by mouth two times a day    Glimepiride 1 Mg Tabs (Glimepiride) .Marland Kitchen... Take 1 tablet by mouth every morning  Orders: TLB-Hepatic/Liver Function Pnl (80076-HEPATIC) TLB-CBC Platelet - w/Differential (85025-CBCD) TLB-TSH (Thyroid Stimulating Hormone) (84443-TSH) TLB-A1C / Hgb A1C (Glycohemoglobin) (83036-A1C)  Problem # 3:  HYPERCHOLESTEROLEMIA (ICD-272.0) Not fasting today- but FLPs have looked OK, needs better diet & get weight down. His updated medication list for this problem includes:    Simvastatin 80 Mg Tabs (Simvastatin) .Marland Kitchen... Take 1 tab by mouth at bedtime  Problem # 4:  OBESITY (ICD-278.00) We discussed diet/ exercise/ weight reduction strategies...  Problem # 5:  GERD (ICD-530.81) GI is stable-  continue same meds.  Problem # 6:  BENIGN PROSTATIC HYPERTROPHY, HX OF (ICD-V13.8) GU stable-  followed by DrTannenbaum...  Problem # 7:  OTHER MEDICAL PROBLEMS AS NOTED>>>  Complete Medication List: 1)  Adult Aspirin Low Strength 81 Mg Tbdp (Aspirin) .... Take 1 tablet by mouth once a day 2)  Simvastatin 80 Mg Tabs (Simvastatin) .... Take 1 tab by mouth at bedtime 3)  Metformin Hcl 500 Mg Tabs (Metformin hcl) .... Take 1 tab by mouth two times a day 4)  Calcium 500 Mg Tabs (Calcium) .... Take 1 tablet by mouth once a day 5)  Fluocinonide-e 0.05 % Crea (Fluocinonide emulsified base) .... Apply to affected area as needed 6)  Vitamin D 400 Unit Tabs (Cholecalciferol) .... Take 1 tablet by mouth once a day 7)  Glimepiride 1 Mg Tabs (Glimepiride) .... Take 1 tablet by mouth every morning  Other Orders: Prescription Created Electronically [phone]) Gastroenterology Referral (GI) TLB-BMP (Basic Metabolic Panel-BMET) (80048-METABOL)  Patient Instructions: 1)  Today we updated your med list- see  below.... 2)  We refilled your meds for 2011... 3)  Today we also did your non-fasting blood work...  please call the "phone tree" in a few days for your lab results.Marland KitchenMarland Kitchen 4)  Good luck w/ the new diet & exercise program... 5)  Call for any problems.Marland KitchenMarland Kitchen 6)  Please schedule a follow-up appointment in 6 months. Prescriptions: FLUOCINONIDE-E 0.05 % CREA (FLUOCINONIDE EMULSIFIED BASE) apply to affected area as needed  #60gm x prn   Entered and Authorized by:   Michele Mcalpine MD   Signed by:   Michele Mcalpine MD on 02/11/2010   Method used:   Print then Give to Patient   RxID:   [id number] METFORMIN HCL 500 MG  TABS (METFORMIN HCL) take 1 tab by mouth two times a day  #180 x 4   Entered and Authorized by:   Lorin Picket  Elayne Snare MD   Signed by:   Michele Mcalpine MD on 02/11/2010   Method used:   Print then Give to Patient   RxID:   [id number] SIMVASTATIN 80 MG  TABS (SIMVASTATIN) take 1 tab by mouth at bedtime  #90 x 4   Entered and Authorized by:   Michele Mcalpine MD   Signed by:   Michele Mcalpine MD on 02/11/2010   Method used:   Print then Give to Patient   RxID:   [id number]    Immunization History:  Influenza Immunization History:    Influenza:  historical (08/26/2009)

## 2010-12-15 NOTE — Miscellaneous (Signed)
Summary: Lec previsit  Clinical Lists Changes  Medications: Added new medication of MOVIPREP 100 GM  SOLR (PEG-KCL-NACL-NASULF-NA ASC-C) As per prep instructions. - Signed Rx of MOVIPREP 100 GM  SOLR (PEG-KCL-NACL-NASULF-NA ASC-C) As per prep instructions.;  #1 x 0;  Signed;  Entered by: Ulis Rias RN;  Authorized by: Meryl Dare MD [facility];  Method used: Electronically to [facility] [address] [phone]*, [address] [place], [place]  [address], Ph: [phone], Fax: [phone] Observations: Added new observation of NKA: T (03/24/2010 10:46)    Prescriptions: MOVIPREP 100 GM  SOLR (PEG-KCL-NACL-NASULF-NA ASC-C) As per prep instructions.  #1 x 0   Entered by:   Ulis Rias RN   Authorized by:   Meryl Dare MD [facility]   Signed by:   Ulis Rias RN on 03/24/2010   Method used:   Electronically to        [facility] [address] [phone]* (retail)       [address]       [place], [place]  [address]       Ph: [phone]       Fax: [phone]   RxID:   [id number]

## 2010-12-15 NOTE — Medication Information (Signed)
Summary: Metformin/[facility]  Metformin/[facility]   Imported By: Sherian Rein 02/16/2010 11:02:15  _____________________________________________________________________  External Attachment:    Type:   Image     Comment:   External Document

## 2010-12-15 NOTE — Procedures (Signed)
Summary: Colonoscopy  Patient: Jason [name] Note: All result statuses are Final unless otherwise noted.  Tests: (1) Colonoscopy (COL)   COL Colonoscopy           DONE     [facility]     [address]     [place], [place]  [address]           COLONOSCOPY PROCEDURE REPORT           PATIENT:  [name], [name]  MR#:  [id number]     BIRTHDATE:  05-07-1941, 69 yrs. old  GENDER:  male     ENDOSCOPIST:  Judie Petit T. Russella Dar, MD, [facility]           PROCEDURE DATE:  04/07/2010     PROCEDURE:  Colonoscopy with biopsy     ASA CLASS:  Class II     INDICATIONS:  1) follow-up of polyp  2) surveillance and high-risk     screening, adenomatous polyp, 01/2005.     MEDICATIONS:   Fentanyl 75 mcg IV, Versed 6 mg IV     DESCRIPTION OF PROCEDURE:   After the risks benefits and     alternatives of the procedure were thoroughly explained, informed     consent was obtained.  Digital rectal exam was performed and     revealed no abnormalities.   The LB [id number] endoscope     was introduced through the anus and advanced to the cecum, which     was identified by both the appendix and ileocecal valve, without     limitations.  The quality of the prep was good, using MoviPrep.     The instrument was then slowly withdrawn as the colon was fully     examined.     <<PROCEDUREIMAGES>>     FINDINGS:  Mild diverticulosis was found in the sigmoid colon.  A     sessile polyp was found in the descending colon. It was 4 mm in     size. The polyp was removed using cold biopsy forceps.  A normal     appearing cecum, ileocecal valve, and appendiceal orifice were     identified. The ascending, hepatic flexure, transverse, splenic     flexure, and rectum appeared unremarkable. Retroflexed views in     the rectum revealed no abnormalities. The time to cecum =  3.25     minutes. The scope was then withdrawn (time =  10.75  min) from the     patient and the procedure completed.     COMPLICATIONS:  None         ENDOSCOPIC IMPRESSION:     1) Mild diverticulosis in the sigmoid colon     2) 4 mm sessile polyp in the descending colon     RECOMMENDATIONS:     1) Await pathology results     2) High fiber diet with liberal fluid intake.     3) Repeat Colonoscopy in 5 years pending pathology review           Jackelin Correia T. Russella Dar, MD, [facility]           CC: Michele Mcalpine, MD           n.     Rosalie DoctorVenita Lick. Elleni Mozingo at 04/07/2010 08:58 AM           Rayford Halsted, [id number]  Note: An exclamation mark (!) indicates a result that was not dispersed into the flowsheet. Document Creation Date:  04/07/2010 8:59 AM _______________________________________________________________________  (1) Order result status: Final Collection or observation date-time: 04/07/2010 08:54 Requested date-time:  Receipt date-time:  Reported date-time:  Referring Physician:   Ordering Physician: Claudette Head [phone]) Specimen Source:  Source: Launa Grill Order Number: [id number] Lab site:   Appended Document: Colonoscopy     Procedures Next Due Date:    Colonoscopy: 03/2015

## 2010-12-15 NOTE — Letter (Signed)
Summary: Colonoscopy Letter  [facility]  [address] [place], [place] [address]   Phone: [phone]  Fax: [phone]      December 23, 2009 MRN: [id number]   Jason [name] [address] [place], [place]  [address]   Dear Jason [name],   According to your medical record, it is time for you to schedule a Colonoscopy. The American Cancer Society recommends this procedure as a method to detect early colon cancer. Patients with a family history of colon cancer, or a personal history of colon polyps or inflammatory bowel disease are at increased risk.  This letter has beeen generated based on the recommendations made at the time of your procedure. If you feel that in your particular situation this may no longer apply, please contact our office.  Please call our office at [phone] to schedule this appointment or to update your records at your earliest convenience.  Thank you for cooperating with [place] to provide you with the very best care possible.   Sincerely,  Judie Petit T. Russella Dar, M.D.  [facility] [phone]

## 2010-12-15 NOTE — Progress Notes (Signed)
Summary: samples  Phone Note Call from Patient Call back at [phone] cell   Caller: Patient Call For: nadel Reason for Call: Talk to Nurse Summary of Call: pt out of his diabetic test strips - wants to know if we have any samples he can get?   Initial call taken by: Eugene Gavia,  July 23, 2010 9:43 AM  Follow-up for Phone Call        no samples of glucose test strips in the office.  called spoke with patient, advised of no samples.  pt requests a refill, with a greater quantity than what he has been getting stating that he sometimes check his BS more than once daily.  onetouch test strips and lancets sent [facility] per pt's request.  patient also states that he is taking simvastatin 80mg , and was informed that he should no longer take the 80mg .  would like to know if he may cut this in half.  please advise, thanks! Follow-up by: Boone Master CNA/MA,  July 23, 2010 10:23 AM  Additional Follow-up for Phone Call Additional follow up Details #1::        per SN---medicare will only cover for non insulin dependant pts to check BS once daily.  the rx that was sent to [facility] has been cancelled for #100 and changed to #30 for the test strips.  it is ok for him to decrease the simvastatin 80mg  to 1/2 tab daily.  this has been changed in the med list.  lmomtcb for pt Randell Loop CMA  July 23, 2010 3:31 PM     New/Updated Medications: SIMVASTATIN 80 MG  TABS (SIMVASTATIN) take 1/2  tab by mouth at bedtime ONETOUCH ULTRA BLUE  STRP (GLUCOSE BLOOD) use as directed to check blood sugar ONETOUCH LANCETS  MISC (LANCETS) use as directed to check blood sugar Prescriptions: ONETOUCH ULTRA BLUE  STRP (GLUCOSE BLOOD) use as directed to check blood sugar  #30 x 11   Entered by:   Randell Loop CMA   Authorized by:   Michele Mcalpine MD   Signed by:   Randell Loop CMA on 07/23/2010   Method used:   [facility] [id number]       [address]       [place], [place]  [address]       Ph: [phone] or [phone]       Fax: [phone]   RxID:   [id number] [facility] LANCETS  MISC (LANCETS) use as directed to check blood sugar  #100 x PRN   Entered by:   Boone Master CNA/MA   Authorized by:   Michele Mcalpine MD   Signed by:   Boone Master CNA/MA on 07/23/2010   Method used:   [facility]        [id number]       [address]       [place], [place]  [address]       Ph: [phone] or [phone]       Fax: [phone]   RxID:   [id number] ONETOUCH ULTRA BLUE  STRP (GLUCOSE BLOOD) use as directed to check blood sugar  #100 x PRN   Entered by:   Boone Master CNA/MA   Authorized by:   Michele Mcalpine MD   Signed by:  Boone Master CNA/MA on 07/23/2010   Method used:   Electronically to        [facility] [address] [phone]* (retail)       [address]       [place], [place]  [address]       Ph: [phone]       Fax: [phone]   RxID:   [id number]  rx for test strips that were sent to the [facility] [address] has been [facility] [address]  called and spoke with pt and he is aware of rx for #30 and that it is ok to take the 1/2 tab of simvastatin 80mg . Randell Loop CMA  July 23, 2010 3:38 PM

## 2010-12-15 NOTE — Progress Notes (Signed)
Summary: note    Phone Note Call from Patient Call back at [phone]   Caller: Patient Call For: nadel Reason for Call: Talk to Nurse Summary of Call: want to go thru a study program of Metformin thru [employer] ?  -  It's a study for people taking 500mg  a day of Metformin.  Need a statement from SN that it's ok. Initial call taken by: Eugene Gavia,  January 27, 2010 1:59 PM  Follow-up for Phone Call        [facility].  Just need to know if he needs a written letter vs typed letter. Does he want to pick it up or [place] mail it to him or to the study program?  Arman Filter LPN  January 27, 2010 2:19 PM   Pt states he wants to participate in a study by comapny named [employer] for pt on Metformin. He needs a letter of clearance from SN stating that it is ok for pt to participate in study. Pt will pick up letter once ready. please advise. Carron Curie CMA  January 27, 2010 2:27 PM   Additional Follow-up for Phone Call Additional follow up Details #1::        per SN---SN will not write the letter until he knows exactly what the study is.  he will need to bring the info for SN to review. thanks Randell Loop CMA  January 27, 2010 4:02 PM     Additional Follow-up for Phone Call Additional follow up Details #2::    [email or web address].  Aundra Millet Reynolds LPN  January 27, 2010 4:06 PM   pt advised need information on study. he staets he will have info packet dropped off here for SN to review. Carron Curie CMA  January 27, 2010 4:11 PM

## 2010-12-15 NOTE — Letter (Signed)
Summary: [facility]  [facility]   Imported By: Lester [place] 01/26/2010 09:30:11  _____________________________________________________________________  External Attachment:    Type:   Image     Comment:   External Document

## 2010-12-15 NOTE — Letter (Signed)
Summary: Patient Notice- Polyp Results  [facility]  [address] [place], [place] [address]   Phone: [phone]  Fax: [phone]        Apr 09, 2010 MRN: [id number]    JADIEN LEHIGH [address] [place], [place]  [address]    Dear Mr. GODSEY,  I am pleased to inform you that the colon polyp(s) removed during your recent colonoscopy was (were) found to be benign (no cancer detected) upon pathologic examination.  I recommend you have a repeat colonoscopy examination in 5 years to look for recurrent polyps, as having colon polyps increases your risk for having recurrent polyps or even colon cancer in the future.  Should you develop new or worsening symptoms of abdominal pain, bowel habit changes or bleeding from the rectum or bowels, please schedule an evaluation with either your primary care physician or with me.  Continue treatment plan as outlined the day of your exam.  Please call us if you are having persistent problems or have questions about your condition that have not been fully answered at this time.  Sincerely,  Meryl Dare MD [facility]  This letter has been electronically signed by your physician.  Appended Document: Patient Notice- Polyp Results letter mailed.

## 2010-12-15 NOTE — Letter (Signed)
Summary: Diabetic Instructions  [facility]  [address] [place], [place] [address]   Phone: [phone]  Fax: [phone]    SUEO CULLEN [birth year] MRN: [id number]   _  _   ORAL DIABETIC MEDICATION INSTRUCTIONS  The day before your procedure:   Take your diabetic pill as you do normally  The day of your procedure:   Do not take your diabetic pill    We will check your blood sugar levels during the admission process and again in Recovery before discharging you home  ________________________________________________________________________  _  _   INSULIN (LONG ACTING) MEDICATION INSTRUCTIONS (Lantus, NPH, 70/30, Humulin, Novolin-N)   The day before your procedure:   Take  your regular evening dose    The day of your procedure:   Do not take your morning dose    _  _   INSULIN (SHORT ACTING) MEDICATION INSTRUCTIONS (Regular, Humulog, Novolog)   The day before your procedure:   Do not take your evening dose   The day of your procedure:   Do not take your morning dose   _  _   INSULIN PUMP MEDICATION INSTRUCTIONS  We will contact the physician managing your diabetic care for written dosage instructions for the day before your procedure and the day of your procedure.  Once we have received the instructions, we will contact you.

## 2010-12-15 NOTE — Medication Information (Signed)
Summary: [facility]   Imported By: Lester [place] 09/25/2010 07:58:38  _____________________________________________________________________  External Attachment:    Type:   Image     Comment:   External Document

## 2010-12-15 NOTE — Progress Notes (Signed)
Summary: fax coming/ supplies  Phone Note From Pharmacy   Caller: mary w/ [facility] Call For: nadel  Summary of Call: caller is faxing a request for diabetics shoes for pt. mary# [phone] Initial call taken by: Tivis Ringer, CNA,  September 17, 2010 8:25 AM  Follow-up for Phone Call        OK- noted by triage.  Vernie Murders  September 17, 2010 9:28 AM

## 2010-12-15 NOTE — Medication Information (Signed)
Summary: Simvastatin/[facility]  Simvastatin/[facility]   Imported By: Sherian Rein 06/23/2010 14:21:00  _____________________________________________________________________  External Attachment:    Type:   Image     Comment:   External Document

## 2010-12-15 NOTE — Progress Notes (Signed)
Summary: study  Phone Note Call from Patient   Caller: Patient Call For: nadel Summary of Call: pt states he does not have info for the study but it can be obtain online. Initial call taken by: Rickard Patience,  January 28, 2010 9:36 AM  Follow-up for Phone Call        pt states he was not given any paperwork just web address for info about the study--the web address is [email or web address]  Philipp Deputy [facility]  January 28, 2010 10:28 AM   Additional Follow-up for Phone Call Additional follow up Details #1::        per SN---he will need some type of form and info to look over before he can say yes to this study.  if pt can get anything like this he will then need an ov with SN to discuss.  SN can not give his ok for this study any other way.  thanks Randell Loop CMA  January 28, 2010 12:20 PM     Additional Follow-up for Phone Call Additional follow up Details #2::    pt aware he will have to get some kind of paperwork so sn is aware of exactly what this program is and how it works--pt fine with this and will contact us once he gets this info Follow-up by: Philipp Deputy CMA,  January 28, 2010 1:41 PM

## 2010-12-15 NOTE — Letter (Signed)
Summary: Jason [name]  [facility]   Imported By: Sherian Rein 02/04/2010 11:32:51  _____________________________________________________________________  External Attachment:    Type:   Image     Comment:   External Document

## 2010-12-15 NOTE — Medication Information (Signed)
Summary: Di[facility]lies / [facility]  [facility] / [facility]   Imported By: Lennie Odor 09/03/2010 14:43:35  _____________________________________________________________________  External Attachment:    Type:   Image     Comment:   External Document

## 2010-12-15 NOTE — Progress Notes (Signed)
Summary: rx refills- out  Phone Note Call from Patient   Caller: Patient Call For: nadel Summary of Call: pt contacted his mailorder for refills of simvistatin and metformin. was told his rx's had run out. needs asap- is out of metformin now. [facility] . call pt on cell [phone] Initial call taken by: Tivis Ringer, CNA,  February 09, 2010 9:54 AM  Follow-up for Phone Call        refill sent. pt advised he needs to schedule follow-up appt per last ov note. Pt transferred to schedulers to set up appt. Carron Curie CMA  February 09, 2010 11:42 AM     Prescriptions: METFORMIN HCL 500 MG  TABS (METFORMIN HCL) take 1 tab by mouth two times a day  #180 x 1   Entered by:   Carron Curie CMA   Authorized by:   Michele Mcalpine MD   Signed by:   Carron Curie CMA on 02/09/2010   Method used:   Faxed to ...       [facility] ([facility]order)       [address] [place], [place]         Ph: [phone]       Fax: [phone]   RxID:   [id number] SIMVASTATIN 80 MG  TABS (SIMVASTATIN) take 1 tab by mouth at bedtime  #90 x 1   Entered by:   Carron Curie CMA   Authorized by:   Michele Mcalpine MD   Signed by:   Carron Curie CMA on 02/09/2010   Method used:   Faxed to ...       [facility] (mail-order)       [address]       [place], [place]         Ph: [phone]       Fax: [phone]   RxID:   [id number]

## 2010-12-15 NOTE — Letter (Signed)
Summary: [facility] Instructions  [facility]  [address] [place], [place] [address]   Phone: [phone]  Fax: [phone]       Jason [name]    06-Oct-1941    MRN: [id number]        Procedure Day /Date:  Tuesday 04/07/2010     Arrival Time: 7:30 am      Procedure Time: 8:30 am     Location of Procedure:                    _x _  [facility] (4th Floor)                        PREPARATION FOR COLONOSCOPY WITH MOVIPREP   Starting 5 days prior to your procedure Thursday 5/19 do not eat nuts, seeds, popcorn, corn, beans, peas,  salads, or any raw vegetables.  Do not take any fiber supplements (e.g. Metamucil, Citrucel, and Benefiber).  THE DAY BEFORE YOUR PROCEDURE         DATE: Monday 5/23  1.  Drink clear liquids the entire day-NO SOLID FOOD  2.  Do not drink anything colored red or purple.  Avoid juices with pulp.  No orange juice.  3.  Drink at least 64 oz. (8 glasses) of fluid/clear liquids during the day to prevent dehydration and help the prep work efficiently.  CLEAR LIQUIDS INCLUDE: Water Jello Ice Popsicles Tea (sugar ok, no milk/cream) Powdered fruit flavored drinks Coffee (sugar ok, no milk/cream) Gatorade Juice: apple, white grape, white cranberry  Lemonade Clear bullion, consomm, broth Carbonated beverages (any kind) Strained chicken noodle soup Hard Candy                             4.  In the morning, mix first dose of MoviPrep solution:    Empty 1 Pouch A and 1 Pouch B into the disposable container    Add lukewarm drinking water to the top line of the container. Mix to dissolve    Refrigerate (mixed solution should be used within 24 hrs)  5.  Begin drinking the prep at 5:00 p.m. The MoviPrep container is divided by 4 marks.   Every 15 minutes drink the solution down to the next mark (approximately 8 oz) until the full liter is complete.   6.  Follow completed prep with 16 oz of clear liquid of your choice (Nothing  red or purple).  Continue to drink clear liquids until bedtime.  7.  Before going to bed, mix second dose of MoviPrep solution:    Empty 1 Pouch A and 1 Pouch B into the disposable container    Add lukewarm drinking water to the top line of the container. Mix to dissolve    Refrigerate  THE DAY OF YOUR PROCEDURE      DATE: Tuesday 5/24  Beginning at 3:30 a.m. (5 hours before procedure):         1. Every 15 minutes, drink the solution down to the next mark (approx 8 oz) until the full liter is complete.  2. Follow completed prep with 16 oz. of clear liquid of your choice.    3. You may drink clear liquids until 6:30 am (2 HOURS BEFORE PROCEDURE).   MEDICATION INSTRUCTIONS  Unless otherwise instructed, you should take regular prescription medications with a small sip of water   as early as possible the morning of  your procedure.         OTHER INSTRUCTIONS  You will need a responsible adult at least 69 years of age to accompany you and drive you home.   This person must remain in the waiting room during your procedure.  Wear loose fitting clothing that is easily removed.  Leave jewelry and other valuables at home.  However, you may wish to bring a book to read or  an iPod/MP3 player to listen to music as you wait for your procedure to start.  Remove all body piercing jewelry and leave at home.  Total time from sign-in until discharge is approximately 2-3 hours.  You should go home directly after your procedure and rest.  You can resume normal activities the  day after your procedure.  The day of your procedure you should not:   Drive   Make legal decisions   Operate machinery   Drink alcohol   Return to work  You will receive specific instructions about eating, activities and medications before you leave.     The above instructions have been reviewed and explained to me by   Ulis Rias RN  Mar 24, 2010 11:22 AM    I fully understand and can verbalize  these instructions _____________________________ Date _________

## 2010-12-15 NOTE — Assessment & Plan Note (Signed)
Summary: 6 months/apc   CC:  6 month ROV & review of mult medical problems....  History of Present Illness: 70 y/o BM here for a follow up visit... he has mult med problems as noted below...   ~  Mar10:  he was seen w/ cellulitis left leg recently and cult= MRSA... XRays of left Tib/Fib were neg... treated w/ Keflex & Doxy w/ improvement, but wound edges are undermined and he will need wound center attention... we will give him a Tetanus shot and refer to wound clinic.  ~  Aug10:  4+months of intensive Rx thru wound care clinic w/ hyperbaric O2 Rx etc (notes reviewed from [facility])... right foot second toe osteomyelitis- amputation by DrBednarz... and s/p laser ablation of left greater saphenous vein by DrEarly... also had phimosis w/ circ 8/10 by DrTannenbaum...  ~  Sep10:  we reviewed all of his surgeries in Aug- he notes much improved now... awaiting special DM shoes... feeling well now- no new complaints or concerns...   ~  February 11, 2010:  he reports a good 79mo- had f/u DrTannenbaum- s/p circ, BPH, ED, norm PSA;  and DrShapiro- no retinopathy...  still having trouble w/ diet, exercise, and weight...  BS not well controlled (BS=192, A1c=7.5) and we decided to add Glimep 1mg /d...   ~  August 11, 2010:  Glimep1mg  has helped w/ BS=124, A1c=5.8 now (we will decr to 1/2 tab)... he has also lost 13# on diet (down to 319# now)... generally stable w/o CP, palpit, ch in SOB, edema, etc... he had colonoscopy 5/11 DrStark w/ divertics & 4mm polyp= tubular adenoma- f/u planned 84yrs.    Current Problem List:  Hx of SINUSITIS (ICD-473.9)  Hx Pneumonia - seen in ER 7/09 w/ faint RLL infiltrate that cleared serially w/ antibiotic Rx...  MITRAL VALVE PROLAPSE (ICD-424.0) - on ASA 81mg /d... he denies CP, palpit, dyspnea... exam w/ MR murmur...  ~  2DEcho 5/01 showed mild ant leaflet MVP, mild MR, norm LVF.  ~  f/u 2DEcho 10/11 = pending...  VENOUS INSUFFICIENCY (ICD-459.81) - he knows to elim salt,  elevate legs, wear TED's...  ~  neg ven dopplers in 1996...   ~  he saw DrLawson in 2000 for a ven stasis ulcer on his leg... rec for TED's, no salt, elevation, etc...   ~  he had normal ABI's in 10/06...  ~  3/10:  left leg wound w/ cellulitis & +MRSA... Rx'd and refer back to wound care clinic.  ~  7/10:  sched for right second toe amputation due to osteomyelitis despite hyperbaric O2 etc...  ~  8/10: s/p right second toe amp for osteomyelitis Lestine Box), and s/p laser ablation of left greater saphenous vein (DrEarly).  HYPERCHOLESTEROLEMIA (ICD-272.0) - changed to SIMVASTATIN 80mg /d last yr... due for f/u FLP.  ~  FLP 9/08 showed TChol 145, TG 54, HDL 46, LDL 88  ~  FLP 9/09 showed TChol 144, Tg 65, HDL 48, LDL 83  ~  FLP 9/10 showed TChol 146, TG 52, HDL 51, LDL 85  DIABETES MELLITUS (ICD-250.00) - on METFORMIN 500mg  Bid + GLIMEPIRIDE 1mg /d, and diet efforts...  ~  labs 9/08 showed BS=123, HgA1c=6.0.Marland Kitchen. urine microalb was neg...  ~  labs 9/09 showed BS= 144, HgA1c= 6.1.Marland Kitchen. rec> same med, better diet.  ~  labs 3/10 showed BS= 147, HgA1c= 6.9.Marland Kitchen. rec> incr the MetformBid, but he never did.  ~  7/10: he states the wound care clinic increased his glucose intake due to Hyperbaric O2 Rx... we  increased his Metform500 Bid.  ~  labs 9/10 on Metform500Bid showed BS= 154, A1c= 6.4  ~  labs 3/11 on Metform500Bid showed BS= 192, A1c= 7.5.Marland Kitchen. rec> add Glimep 1mg /d.  ~  labs 9/11 on Metform500Bid+Glim1 showed BS= 124, A1c= 5.8.Marland Kitchen. rec> decr Glimep1mg  to 1/2 tab daily...  OBESITY (ICD-278.00) - he is very large at 6\' 8"  tall and >300# for the last 10+ yrs... peak = 340#... he drinks beer daily... discussed diet + exercise program (and no beer)...  ~  he had transient right gynecomastia 9/09, no discharge, neg mammogram & resolved on it's own...  ~  weight 3/11 = 333#  ~  weight 9/11 = 319#  GERD (ICD-530.81)  DIVERTICULOSIS OF COLON (ICD-562.10) / IRRITABLE BOWEL SYNDROME (ICD-564.1) & COLONIC POLYPS  (ICD-211.3)  ~  colonoscopy 3/06 by DrStark showing divertics & 2-67mm polyps (adenomas)...  ~  colonoscopy 9/11 by DrStark showed divertics & 4mm polyp= tubular adenoma... f/u planned 50yrs.  BENIGN PROSTATIC HYPERTROPHY, HX OF (ICD-V13.8) - eval by DrTannenbaum 7/08 and Rx w/ Cialis... he has tried the PEP shots... + fam hx prostate ca in his father...  ~  8/10:  DrTannenbaum did circ for phimosis, pt states that PSA check was OK...  DEGENERATIVE JOINT DISEASE (ICD-715.90) - he uses OTC meds Prn, and was rec to start Vit supplements by DrTannenbaum- takes MVI, VitC, VitD... he has seen DrHilts in the past...  Hx of HIDRADENITIS (ICD-705.83)  Health Maintenance - up to date on colon & prostate screens... PNEUMOVAX 9/09... TETANUS booster 3/10... gets yearly Flu vaccines in Fall.  [id number]  Preventive Screening-Counseling & Management  Alcohol-Tobacco     Smoking Status: quit     Packs/Day: 1ppd     Year Started: 1958     Year Quit: 09-06-1972     Pack years: 86yrs, 1ppd  Allergies (verified): No Known Drug Allergies  Comments:  Nurse/Medical Assistant: The patient's medications and allergies were reviewed with the patient and were updated in the Medication and Allergy Lists.  Past History:  Past Medical History: Hx of SINUSITIS (ICD-473.9) Hx of PNEUMONIA (ICD-486) MITRAL VALVE PROLAPSE (ICD-424.0) VENOUS INSUFFICIENCY (ICD-459.81) HYPERCHOLESTEROLEMIA (ICD-272.0) DIABETES MELLITUS (ICD-250.00) OBESITY (ICD-278.00) GERD (ICD-530.81) DIVERTICULOSIS OF COLON (ICD-562.10) IRRITABLE BOWEL SYNDROME (ICD-564.1) COLONIC POLYPS (ICD-211.3) BENIGN PROSTATIC HYPERTROPHY, HX OF (ICD-V13.8) Hx of GYNECOMASTIA, UNILATERAL (ICD-611.1) DEGENERATIVE JOINT DISEASE (ICD-715.90) Hx of HIDRADENITIS (ICD-705.83)  Past Surgical History: S/P knee arthroscopy S/P right foot second toe amputation by DrBednarz 8/10 S/P laser ablation of left greater saphenous vein by DrEarly 8/10 S/P circ  for phimosis 8/10 by DrTannenbaum  Family History: Reviewed history from 06/12/2009 and no changes required. mother alive age 52 father deceased age 82 from stroke, DM  hx of prostate cancer 1 sibling deceased age 24 from brain tumor  Social History: Reviewed history from 06/12/2009 and no changes required. married 2 children quit smoking 1973  smoked for 12 years no caffeine use alcohol use---beer  Review of Systems      See HPI  The patient denies anorexia, fever, weight loss, weight gain, vision loss, decreased hearing, hoarseness, chest pain, syncope, dyspnea on exertion, peripheral edema, prolonged cough, headaches, hemoptysis, abdominal pain, melena, hematochezia, severe indigestion/heartburn, hematuria, incontinence, muscle weakness, suspicious skin lesions, transient blindness, difficulty walking, depression, unusual weight change, abnormal bleeding, enlarged lymph nodes, and angioedema.    Vital Signs:  Patient profile:   70 year old male Height:      80 inches Weight:      319.25 pounds  BMI:     35.20 O2 Sat:      94 % on Room air Temp:     97.0 degrees F oral Pulse rate:   73 / minute BP sitting:   122 / 60  (right arm) Cuff size:   large  Vitals Entered By: Randell Loop CMA (August 11, 2010 10:06 AM)  O2 Sat at Rest %:  94 O2 Flow:  Room air CC: 6 month ROV & review of mult medical problems... Is Patient Diabetic? Yes Pain Assessment Patient in pain? no      Comments meds updated today with pt   Physical Exam  Additional Exam:  WD, Overweight, very large 70 y/o BM in NAD... GENERAL:  Alert & oriented; pleasant & cooperative. HEENT:  [place]/AT, EOM-wnl, PERRLA, EACs-clear, TMs-wnl, NOSE-clear, THROAT-clear & wnl. NECK:  Supple w/ fairROM; no JVD; normal carotid impulses w/o bruits; no thyromegaly or nodules palpated; no lymphadenopathy. CHEST:  Clear to P & A; without wheezes/ rales/ or rhonchi heard... HEART:  Regular Rhythm; gr 1/6 SEM without rubs or  gallops detected... ABDOMEN:  Soft & nontender; normal bowel sounds; no organomegaly or masses detected. EXT: without deformities, mod arthritic changes; wearing TEDs +varicose veins/ +venous insuffic/ chr 1+ edema... NEURO:  CN's intact;  no focal neuro deficits...    MISC. Report  Procedure date:  08/11/2010  Findings:      BMP (METABOL)   Sodium                    140 mEq/L                   135-145   Potassium                 4.4 mEq/L                   3.5-5.1   Chloride                  104 mEq/L                   96-112   Carbon Dioxide            30 mEq/L                    19-32   Glucose              [H]  124 mg/dL                   01-09   BUN                       13 mg/dL                    3-23   Creatinine                0.9 mg/dL                   5.5-7.3   Calcium                   9.2 mg/dL                   2.2-02.5   GFR                       107.79 mL/min               >  60  Hepatic/Liver Function Panel (HEPATIC)   Total Bilirubin           1.2 mg/dL                   1.6-1.0   Direct Bilirubin          0.2 mg/dL                   9.6-0.4   Alkaline Phosphatase [L]  36 U/L                      39-117   AST                       27 U/L                      0-37   ALT                       33 U/L                      0-53   Total Protein             7.5 g/dL                    5.4-0.9   Albumin                   4.2 g/dL                    8.1-1.9  Hemoglobin A1C (A1C)   Hemoglobin A1C            5.8 %                       4.6-6.5   Impression & Recommendations:  Problem # 1:  DIABETES MELLITUS (ICD-250.00) Improved w/ the Glimep added... now w/ A1c= 5.8 so we will decr the Glimep 1mg  to 1/2 tab daily. His updated medication list for this problem includes:    Adult Aspirin Low Strength 81 Mg Tbdp (Aspirin) .Marland Kitchen... Take 1 tablet by mouth once a day    Metformin Hcl 500 Mg Tabs (Metformin hcl) .Marland Kitchen... Take 1 tab by mouth two times a day    Glimepiride 1 Mg Tabs  (Glimepiride) .Marland Kitchen... Take 1/2 tablet by mouth every morning  Orders: TLB-BMP (Basic Metabolic Panel-BMET) (80048-METABOL) TLB-Hepatic/Liver Function Pnl (80076-HEPATIC) TLB-A1C / Hgb A1C (Glycohemoglobin) (83036-A1C)  Problem # 2:  VENOUS INSUFFICIENCY (ICD-459.81) Stable>  he knows to elim salt, elevate legs, wear support hose...  Problem # 3:  HYPERCHOLESTEROLEMIA (ICD-272.0) Stable on the Simva80 & tol well... His updated medication list for this problem includes:    Simvastatin 80 Mg Tabs (Simvastatin) .Marland Kitchen... Take 1/2  tab by mouth at bedtime  Problem # 4:  OBESITY (ICD-278.00) Wt down 12# on diet Rx... keep up the good work..  Problem # 5:  DEGENERATIVE JOINT DISEASE (ICD-715.90) Aware> we discussed exercise, offered PT, continue OTC anti-inflamm Rx... His updated medication list for this problem includes:    Adult Aspirin Low Strength 81 Mg Tbdp (Aspirin) .Marland Kitchen... Take 1 tablet by mouth once a day  Problem # 6:  OTHER MEDICAL PROBLEMS AS NOTED>>> We will arrange for a follow up 2DEcho...  Complete Medication List: 1)  Adult Aspirin Low Strength 81 Mg Tbdp (Aspirin) .Marland KitchenMarland KitchenMarland Kitchen  Take 1 tablet by mouth once a day 2)  Simvastatin 80 Mg Tabs (Simvastatin) .... Take 1/2  tab by mouth at bedtime 3)  Metformin Hcl 500 Mg Tabs (Metformin hcl) .... Take 1 tab by mouth two times a day 4)  Glimepiride 1 Mg Tabs (Glimepiride) .... Take 1/2 tablet by mouth every morning 5)  Calcium 500 Mg Tabs (Calcium) .... Take 1 tablet by mouth once a day 6)  Fluocinonide-e 0.05 % Crea (Fluocinonide emulsified base) .... Apply to affected area as needed 7)  Vitamin D 400 Unit Tabs (Cholecalciferol) .... Take 1 tablet by mouth once a day 8)  Onetouch Ultra Blue Strp (Glucose blood) .... Use as directed to check blood sugar 9)  Onetouch Lancets Misc (Lancets) .... Use as directed to check blood sugar  Other Orders: 2 D Echo (2 D Echo)  Patient Instructions: 1)  Today we updated your med list- see below.... 2)   We refilled the meds you requested for mail order... 3)  Today we did your follow up non-fasting blood work... please call the "phone tree" in a few days for your lab results.Marland KitchenMarland Kitchen 4)  Keep up the good work w/ weight reduction!!! 5)  Today we gave you the 2011 Flu vaccine... 6)  We will arrange for a [facility]ogram at [facility]... we will call you w/ these results when avail.Marland KitchenMarland Kitchen 7)  Call for any questions... 8)  Please schedule a follow-up appointment in 6 months. Prescriptions: GLIMEPIRIDE 1 MG TABS (GLIMEPIRIDE) Take 1 tablet by mouth every morning  #90 x 4   Entered and Authorized by:   Michele Mcalpine MD   Signed by:   Michele Mcalpine MD on 08/11/2010   Method used:   Print then Give to Patient   RxID:   [id number] METFORMIN HCL 500 MG  TABS (METFORMIN HCL) take 1 tab by mouth two times a day  #180 x 4   Entered and Authorized by:   Michele Mcalpine MD   Signed by:   Michele Mcalpine MD on 08/11/2010   Method used:   Print then Give to Patient   RxID:   [id number] SIMVASTATIN 80 MG  TABS (SIMVASTATIN) take 1/2  tab by mouth at bedtime  #90 x 4   Entered and Authorized by:   Michele Mcalpine MD   Signed by:   Michele Mcalpine MD on 08/11/2010   Method used:   Print then Give to Patient   RxID:   [id number]

## 2010-12-15 NOTE — Progress Notes (Signed)
Summary: appt  Phone Note Call from Patient Call back at Home Phone [phone] Call back at [phone] cell   Caller: Patient Call For: nadel Reason for Call: Talk to Nurse Complaint: Cough/Sore throat Summary of Call: ? re: use of Simvastatin 80mg  - heard it damages muscles.  Have a cold, cough, congestion, ? pna.  No fever but weak, no energy.  Would like to be seen, will see TP. Initial call taken by: Eugene Gavia,  April 30, 2010 8:26 AM  Follow-up for Phone Call        called and spoke with pt.  pt c/o a "cold" x 2 1/2 weeks.  Symptoms include sneezing, coughing up clear sputum with occ streaks of green, and hoarseness.  Pt denied nasal congestion, sore throat, fever, body aches, chills or increased sob.    Pt also c/o "no energy"  Pt unsure if this is d/t the cold or from being on Simvastatin. Pt states he saw on the TV where Simvastatin can cause muscle break down and pt concerned about this.    NO appts avail today with TP or SN, Please advise.  Thanks.  Aundra Millet Reynolds LPN  April 30, 2010 9:07 AM  NKDA  Additional Follow-up for Phone Call Additional follow up Details #1::        per sn ok for a z-pak for cold symptoms and mucinex 600mg  2 by mouth twice a day  for the simvastatin tell him we will change this to crestor 20mg  1 by mouth once daily #30.    Philipp Deputy [facility]  April 30, 2010 5:34 PM     Additional Follow-up for Phone Call Additional follow up Details #2::    spoke to pt and he does not want to change to crestor at this time he wants to decrease dose of simvastatin to 40mg  (he is currently on 80mg ) to see if the symptoms get better he wants to do this until he has f/u appt with sn in september--per sn this is fine but we will need to recheck chol. in september to see if he can stay on this Follow-up by: Philipp Deputy CMA,  April 30, 2010 5:45 PM  New/Updated Medications: ZITHROMAX Z-PAK 250 MG TABS (AZITHROMYCIN) take as directed Prescriptions: ZITHROMAX Z-PAK 250 MG  TABS (AZITHROMYCIN) take as directed  #1 x 0   Entered by:   Philipp Deputy CMA   Authorized by:   Michele Mcalpine MD   Signed by:   Philipp Deputy CMA on 04/30/2010   Method used:   Electronically to        [facility] [phone]* (retail)       [address]       [place], [place]  [address]       Ph: [phone]       Fax: [phone]   RxID:   [id number]

## 2011-01-20 ENCOUNTER — Encounter: Payer: Self-pay | Admitting: Pulmonary Disease

## 2011-02-01 LAB — GLUCOSE, CAPILLARY: Glucose-Capillary: 137 mg/dL — ABNORMAL HIGH (ref 70–99)

## 2011-02-02 NOTE — Letter (Signed)
Summary: [facility]   Imported By: Sherian Rein 01/28/2011 10:14:00  _____________________________________________________________________  External Attachment:    Type:   Image     Comment:   External Document

## 2011-02-09 ENCOUNTER — Encounter: Payer: Self-pay | Admitting: *Deleted

## 2011-02-10 ENCOUNTER — Other Ambulatory Visit (INDEPENDENT_AMBULATORY_CARE_PROVIDER_SITE_OTHER): Payer: Medicare Other

## 2011-02-10 ENCOUNTER — Telehealth: Payer: Self-pay | Admitting: *Deleted

## 2011-02-10 ENCOUNTER — Ambulatory Visit (INDEPENDENT_AMBULATORY_CARE_PROVIDER_SITE_OTHER): Payer: Medicare Other | Admitting: Pulmonary Disease

## 2011-02-10 ENCOUNTER — Encounter: Payer: Self-pay | Admitting: Pulmonary Disease

## 2011-02-10 DIAGNOSIS — K5730 Diverticulosis of large intestine without perforation or abscess without bleeding: Secondary | ICD-10-CM

## 2011-02-10 DIAGNOSIS — E119 Type 2 diabetes mellitus without complications: Secondary | ICD-10-CM

## 2011-02-10 DIAGNOSIS — I059 Rheumatic mitral valve disease, unspecified: Secondary | ICD-10-CM

## 2011-02-10 DIAGNOSIS — E669 Obesity, unspecified: Secondary | ICD-10-CM

## 2011-02-10 DIAGNOSIS — M1990 Unspecified osteoarthritis, unspecified site: Secondary | ICD-10-CM

## 2011-02-10 DIAGNOSIS — E7800 Pure hypercholesterolemia, unspecified: Secondary | ICD-10-CM

## 2011-02-10 DIAGNOSIS — L03119 Cellulitis of unspecified part of limb: Secondary | ICD-10-CM

## 2011-02-10 DIAGNOSIS — Z87898 Personal history of other specified conditions: Secondary | ICD-10-CM

## 2011-02-10 DIAGNOSIS — I872 Venous insufficiency (chronic) (peripheral): Secondary | ICD-10-CM

## 2011-02-10 DIAGNOSIS — L02419 Cutaneous abscess of limb, unspecified: Secondary | ICD-10-CM

## 2011-02-10 LAB — HEPATIC FUNCTION PANEL
AST: 27 U/L (ref 0–37)
Albumin: 4.1 g/dL (ref 3.5–5.2)
Alkaline Phosphatase: 35 U/L — ABNORMAL LOW (ref 39–117)
Total Protein: 7.4 g/dL (ref 6.0–8.3)

## 2011-02-10 LAB — HEMOGLOBIN A1C: Hgb A1c MFr Bld: 5.9 % (ref 4.6–6.5)

## 2011-02-10 LAB — CBC WITH DIFFERENTIAL/PLATELET
Basophils Absolute: 0 10*3/uL (ref 0.0–0.1)
Lymphocytes Relative: 26.7 % (ref 12.0–46.0)
Monocytes Relative: 8.2 % (ref 3.0–12.0)
Platelets: 165 10*3/uL (ref 150.0–400.0)
RDW: 12.3 % (ref 11.5–14.6)

## 2011-02-10 LAB — BASIC METABOLIC PANEL
BUN: 13 mg/dL (ref 6–23)
CO2: 30 mEq/L (ref 19–32)
Calcium: 9.4 mg/dL (ref 8.4–10.5)
Glucose, Bld: 114 mg/dL — ABNORMAL HIGH (ref 70–99)
Potassium: 4.4 mEq/L (ref 3.5–5.1)
Sodium: 138 mEq/L (ref 135–145)

## 2011-02-10 LAB — TSH: TSH: 0.99 u[IU]/mL (ref 0.35–5.50)

## 2011-02-10 LAB — LIPID PANEL
Cholesterol: 141 mg/dL (ref 0–200)
HDL: 49.5 mg/dL (ref >39.00)

## 2011-02-10 MED ORDER — GLIMEPIRIDE 1 MG PO TABS: ORAL_TABLET | ORAL | Status: DC

## 2011-02-10 MED ORDER — SIMVASTATIN 40 MG PO TABS: 40.0000 mg | ORAL_TABLET | Freq: Every evening | ORAL | Status: DC

## 2011-02-10 MED ORDER — METFORMIN HCL 500 MG PO TABS: 500.0000 mg | ORAL_TABLET | Freq: Two times a day (BID) | ORAL | Status: DC

## 2011-02-10 MED ORDER — GLIMEPIRIDE 1 MG PO TABS: 1.0000 mg | ORAL_TABLET | Freq: Every day | ORAL | Status: DC

## 2011-02-10 NOTE — Assessment & Plan Note (Signed)
As noted in Overview> pt never decr the glimep 1mg  to 1/2 tab Qam as rec after 9/11 OV;  Blood work similar today even w/ the 9# wt gain;  Therefore rec diet/ exercise/ get wt down/ and decr the glimepiride 1mg  to 1/2 tab daily.

## 2011-02-10 NOTE — Patient Instructions (Signed)
Today we updated your med list & refilled 90d supplies as requested... We also did your Fasting blood work (except for the banana) today... Please call the "phone tree" in a few days for your results>> PHONE TREE: dial [phone] & when prompted enter your pt number followed by the # symbol> [phone]  Good luck w/ the new job, diet & exercise program... Work on weight reduction... Call for any questions... Let's plan a follow up visit in 6 months.Marland KitchenMarland Kitchen

## 2011-02-10 NOTE — Progress Notes (Signed)
Subjective:    Patient ID: Jason [name], male    DOB: [birth year], 70 y.o.   MRN: [id number]  HPI 70 y/o BM here for a follow up visit... he has mult med problems including:  MVP;  Chr Ven Insuffic;  Hyperchol;  DM;  Obesity;  GERD/ Divertics/ IBS/ Colon Polyps;  BPH;  DJD...  ~  August 11, 2010:  Glimep1mg  has helped w/ BS=124, A1c=5.8 now (we will decr to 1/2 tab)... he has also lost 13# on diet (down to 319# now)... generally stable w/o CP, palpit, ch in SOB, edema, etc... he had colonoscopy 5/11 Jason [name] w/ divertics & 4mm polyp= tubular adenoma- f/u planned 45yrs.  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    Chol>  On Simva40 (taking 1/2 of the 80) and FLP shows TChol 141, TG 55, HDL 50, LDL 81;  Looks good- keep same dose + better diet & get wt down!    DM>  On Metformin 500Bid & Glimep 1mg /d;  BS at home are OK per pt & denies low sugar events;  Labs today shows BS 114, A1c 5.9 (despite his wt gain) & rec that he decr the Glimep 1mg  to 1/2 tab daily...    Obesity>  Unfortunately weight is UP 9# to 328# & we reviewed the need for wt reduction- diet + exercise program...    GI>  Denies abd pain, N/V/D/C/ or blood seen;  Hasn't needed any OTC acid suppressors etc...    GU>  He saw Jason [name] et al 3/12 for f/u BPH, ED, & PSA recheck;  Voiding satis, given Cialis & PSA= 0.47 (he had 58cc PVR- not on meds at present)...    DJD/ LBP>  C/o LBP w/ walking, no radiation;  He declines further eval at this point, prefers exerc program, heat, aleve Prn...    Health Maintenance:  up to date on colon & prostate screens... PNEUMOVAX 9/09... TETANUS booster 3/10... gets yearly Flu vaccines in Fall.   Review of Systems         See HPI  The patient denies anorexia, fever, weight loss, weight gain, vision loss, decreased hearing, hoarseness, chest pain, syncope, dyspnea on exertion, peripheral edema, prolonged cough, headaches, hemoptysis, abdominal pain, melena, hematochezia, severe indigestion/heartburn, hematuria, incontinence, muscle weakness, suspicious skin lesions, transient blindness, difficulty walking, depression, unusual weight change, abnormal bleeding, enlarged lymph nodes, and angioedema.     Objective:   Physical Exam     WD, Overweight, very large 70 y/o BM in NAD... GENERAL:  Alert & oriented; pleasant & cooperative. HEENT:  Jason [name]/AT, EOM-wnl, PERRLA, EACs-clear, TMs-wnl, NOSE-clear, THROAT-clear & wnl. NECK:  Supple w/ fairROM; no JVD; normal carotid impulses w/o bruits; no thyromegaly or nodules palpated; no lymphadenopathy. CHEST:  Clear to P & A; without wheezes/ rales/ or rhonchi heard... HEART:  Regular Rhythm; gr 1/6 SEM without rubs or gallops detected... ABDOMEN:  Soft & nontender;  normal bowel sounds; no organomegaly or masses detected. EXT: without deformities, mod arthritic changes; wearing TEDs +varicose veins/ +venous insuffic/ chr 1+ edema... NEURO:  CN's intact;  no focal neuro deficits... Derm:  Mild stasis changes in LEs w/o cellulitis etc...   Assessment & Plan:

## 2011-02-10 NOTE — Assessment & Plan Note (Signed)
On Simva40 (currently 1/2 of the 80mg  tabs) & FLP today looks good;  Continue same med but needs better diet/ exerc/ wt loss/ etc..Marland Kitchen

## 2011-02-10 NOTE — Assessment & Plan Note (Signed)
We again reviewed diet + exercise therapy rec to lose weight.Marland KitchenMarland Kitchen

## 2011-02-10 NOTE — Assessment & Plan Note (Signed)
He remains stable & denies CP, palpit, SOB, dizzy, ch in edema, etc..Marland Kitchen

## 2011-02-10 NOTE — Assessment & Plan Note (Signed)
Pt c/o some LBP> he has seen DrHilts in the past;  He declines back XRays at this time or Ortho referral, prefers to Rx w/ rest, heat Advil OTC, & he will start "therapy" in a gym w/ exercises & wt reduction program..Marland Kitchen

## 2011-02-10 NOTE — Assessment & Plan Note (Signed)
Stable, w/o recurrent cellulitis etc..Marland Kitchen

## 2011-02-10 NOTE — Telephone Encounter (Signed)
Called and spoke with pt about his lab results per SN----lip looks good on the simvastatin so we will keep this the same--needs much better diet and get weight down for his sugars---keep the metformin the same but decrease the glimepiride to 1/2 tab every morning.  All other labs looked good and med list has been updated today.

## 2011-02-10 NOTE — Assessment & Plan Note (Signed)
Recent Urology eval was neg> PSA normal, he had sl +PVR but no meds yet.Jason KitchenMarland [name]

## 2011-02-10 NOTE — Assessment & Plan Note (Signed)
He has GERD, Divertics, IBS, Polyps>  All stable & quiet;  Uses OTC Pepcid Prn & LOC when needed.Marland KitchenMarland Kitchen

## 2011-02-18 ENCOUNTER — Telehealth: Payer: Self-pay | Admitting: Pulmonary Disease

## 2011-02-18 NOTE — Telephone Encounter (Signed)
Spoke with pharmacist at [facility], Jonny Ruiz [phone]). Gave clarification on Amaryl 1 mg, 1/2 po every morning, # 45 w/ 3 refills. Pt is aware this has been done and RX should be sent out today.

## 2011-02-20 LAB — BASIC METABOLIC PANEL
BUN: 14 mg/dL (ref 6–23)
CO2: 27 mEq/L (ref 19–32)
Calcium: 9.3 mg/dL (ref 8.4–10.5)
Chloride: 107 mEq/L (ref 96–112)
Creatinine, Ser: 1.14 mg/dL (ref 0.4–1.5)
Glucose, Bld: 136 mg/dL — ABNORMAL HIGH (ref 70–99)

## 2011-02-20 LAB — CBC
MCHC: 34 g/dL (ref 30.0–36.0)
MCV: 94.2 fL (ref 78.0–100.0)
Platelets: 191 10*3/uL (ref 150–400)
RBC: 4.07 MIL/uL — ABNORMAL LOW (ref 4.22–5.81)
RDW: 12.7 % (ref 11.5–15.5)

## 2011-02-20 LAB — GLUCOSE, CAPILLARY
Glucose-Capillary: 128 mg/dL — ABNORMAL HIGH (ref 70–99)
Glucose-Capillary: 158 mg/dL — ABNORMAL HIGH (ref 70–99)

## 2011-02-20 LAB — POCT I-STAT 4, (NA,K, GLUC, HGB,HCT)
Potassium: 3.9 mEq/L (ref 3.5–5.1)
Sodium: 140 mEq/L (ref 135–145)

## 2011-02-21 LAB — GLUCOSE, CAPILLARY
Glucose-Capillary: 146 mg/dL — ABNORMAL HIGH (ref 70–99)
Glucose-Capillary: 166 mg/dL — ABNORMAL HIGH (ref 70–99)
Glucose-Capillary: 234 mg/dL — ABNORMAL HIGH (ref 70–99)
Glucose-Capillary: 149 mg/dL — ABNORMAL HIGH (ref 70–99)
Glucose-Capillary: 156 mg/dL — ABNORMAL HIGH (ref 70–99)
Glucose-Capillary: 158 mg/dL — ABNORMAL HIGH (ref 70–99)
Glucose-Capillary: 196 mg/dL — ABNORMAL HIGH (ref 70–99)
Glucose-Capillary: 208 mg/dL — ABNORMAL HIGH (ref 70–99)
Glucose-Capillary: 226 mg/dL — ABNORMAL HIGH (ref 70–99)

## 2011-02-22 LAB — GLUCOSE, CAPILLARY
Glucose-Capillary: 127 mg/dL — ABNORMAL HIGH (ref 70–99)
Glucose-Capillary: 130 mg/dL — ABNORMAL HIGH (ref 70–99)
Glucose-Capillary: 136 mg/dL — ABNORMAL HIGH (ref 70–99)
Glucose-Capillary: 151 mg/dL — ABNORMAL HIGH (ref 70–99)
Glucose-Capillary: 167 mg/dL — ABNORMAL HIGH (ref 70–99)
Glucose-Capillary: 145 mg/dL — ABNORMAL HIGH (ref 70–99)
Glucose-Capillary: 254 mg/dL — ABNORMAL HIGH (ref 70–99)

## 2011-02-23 LAB — CBC
MCV: 94 fL (ref 78.0–100.0)
RBC: 4.15 MIL/uL — ABNORMAL LOW (ref 4.22–5.81)
WBC: 5.2 10*3/uL (ref 4.0–10.5)

## 2011-02-23 LAB — PROTIME-INR: INR: 1.1 (ref 0.00–1.49)

## 2011-02-23 LAB — DIFFERENTIAL
Eosinophils Absolute: 0.2 10*3/uL (ref 0.0–0.7)
Lymphs Abs: 1 10*3/uL (ref 0.7–4.0)
Monocytes Relative: 9 % (ref 3–12)
Neutro Abs: 3.5 10*3/uL (ref 1.7–7.7)
Neutrophils Relative %: 67 % (ref 43–77)

## 2011-02-26 ENCOUNTER — Telehealth: Payer: Self-pay | Admitting: *Deleted

## 2011-02-26 NOTE — Telephone Encounter (Signed)
  [facility] faxed a request to verify instructions for Glimepride 1mg .  Faxed response per Dr Kriste Basque of Instructions are Take 1/2 tab PO QD at breakfast.  Faxed to [phone].

## 2011-03-30 NOTE — Assessment & Plan Note (Signed)
OFFICE VISIT   ALMON, WHITFORD  DOB:  1941-11-15                                       07/02/2009  CHART#:[id number]   The patient is here today for laser ablation of his left great saphenous  vein.  This was accomplished in our office with no immediate  complications.  He does have continued good healing of his venous stasis  ulcer and will see [place] again in 1 week with repeat duplex followup.   Larina Earthly, M.D.  Electronically Signed   TFE/MEDQ  D:  07/02/2009  T:  07/03/2009  Job:  [id number]

## 2011-03-30 NOTE — Assessment & Plan Note (Signed)
[facility]   NAME:  Jason [name], Jason [name]               ACCOUNT NO.:  [id number]   MEDICAL RECORD NO.:  [id number]      DATE OF BIRTH:  11/26/1940   PHYSICIAN:  Jonelle Sports. Sevier, M.D.  VISIT DATE:  06/11/2009                                   OFFICE VISIT   HISTORY:  This 70 year old black male was being followed for a stasis  ulceration on the left lower extremity when he arrived 1 day with a  fulminant draining abscess of the right second toe.  This in the phase  of known diabetes.  The wound was drained and evaluation showed that he  had evidence for osteomyelitis.  He was accordingly begun on antibiotics  as well as a course of hyperbaric oxygen therapy.  He is seen to be  responding, but repeat studies done near the end of his 30 series of  treatments showed that there is persistent active osteomyelitis.  Accordingly, he attends consultation later in the day with Dr. Leonides Grills regarding amputation of this toe, following which we will return  him to HBO in an effort to make sure that the wound heals and the flap  does well.   The patient today has no particular complaints, has noted some slight  drainage again from left foot wound.   PHYSICAL EXAMINATION:  Blood pressure 115/72, pulse 72, respirations 18,  and temperature 98.4.  Capillary blood glucose 165.  There are now 2  small draining wounds on the right second toe, 1 at the tip and 1  dorsolaterally.   The venous stasis lesion on the left anterior lower extremity measures  3.1 x 0.6 and is partially epithelialized with only several small areas  in the center of that have not re-epithelialized as yet.   IMPRESSION:  Persistent osteomyelitis, right second toe.  Venous stasis  ulceration, left lower extremity.   The patient's second toe will simply be wrapped as he is going directly  from here to the orthopedist office.   The venous stasis ulceration of the left pretibial area will be treated  with  an application of Silvadene cream, Adaptic dressing, and then a  Kerlix, Coban wrap.   Follow up visit will be here in 1 week.           ______________________________  Jonelle Sports. Cheryll Cockayne, M.D.     RES/MEDQ  D:  06/11/2009  T:  06/11/2009  Job:  [id number]

## 2011-03-30 NOTE — Assessment & Plan Note (Signed)
[facility]   NAME:  Jason [name], Jason [name]               ACCOUNT NO.:  [id number]   MEDICAL RECORD NO.:  [id number]      DATE OF BIRTH:  10-Feb-1941   PHYSICIAN:  Joanne Gavel, M.D.              VISIT DATE:                                   OFFICE VISIT   CHIEF COMPLAINT:  Venous ulcer.   HISTORY OF PRESENT ILLNESS:  This patient has a long history of venous  insufficiency and has had several venous ulcerations.  His first office  visit was last week.  He was seen by Dr. Cheryll Cockayne.  He was debrided,  treated with calcium alginate and Unna boot.  Today he returns.  The  ulcer is markedly smaller from 1.2 x 0.7 to 0.9 x 0.6.  The ulcer is  clean.  Surrounding area is without erythema.   IMPRESSION:  Improvement.   PLAN:  Continue same treatment, calcium alginate and Unna boot.  Return  in 1 week.      Joanne Gavel, M.D.  Electronically Signed     RA/MEDQ  D:  03/04/2009  T:  03/04/2009  Job:  [id number]

## 2011-03-30 NOTE — Assessment & Plan Note (Signed)
[facility]   NAME:  ENZIO, BUCHLER               ACCOUNT NO.:  [id number]   MEDICAL RECORD NO.:  [id number]      DATE OF BIRTH:  October 31, 1941   PHYSICIAN:  Joanne Gavel, M.D.         VISIT DATE:  03/12/2009                                   OFFICE VISIT   CHIEF COMPLAINT:  Venous ulcer.   HISTORY OF PRESENT ILLNESS:  This patient was treated last week with  Unna boot.  His wound last week was 1.2 x 0.7 x 0.9.  Today, the ulcer  was covered with scab.  The scab was removed and he is left with a less  than 1 mm wound.   PLAN:  Continue same treatment, to apply Unna boot, and calcium  alginate.   PLAN:  See in 7 days.  The wound will probably be healed.      Joanne Gavel, M.D.  Electronically Signed     RA/MEDQ  D:  03/12/2009  T:  03/13/2009  Job:  [id number]

## 2011-03-30 NOTE — Assessment & Plan Note (Signed)
[facility]   NAME:  Jason [name], Jason [name]               ACCOUNT NO.:  [id number]   MEDICAL RECORD NO.:  [id number]      DATE OF BIRTH:  1941/01/04   PHYSICIAN:  Jonelle Sports. Sevier, M.D.  VISIT DATE:  05/28/2009                                   OFFICE VISIT   HISTORY:  This 70 year old black male is being treated for osteomyelitis  secondary to a deep abscess of the second toe of the right foot.  These  things are, of course, in the phase of diabetes.  He was originally  drained and treated with antibiotics and placed on hyperbaric oxygen  therapy, and he is nearing the end of his first course of 30 treatments  of therapy.  The swelling in the toe has come down considerably.  There  is no longer any fluctuance.  He has had no fever or chills.  He does  still have some adenopathy.  He no longer notices any drainage from the  toe.  He is being evaluated today as a routine weekly wound assessment  in connection with his HBO therapy.   The wound today measures maximal 1.5 x 1.5 x 0.5 cm, has a kind of a  stellate nature to it, is fairly clean.  There is no purulence  expressible and no fluctuance that I can appreciate.  There is some  accumulated callus, and he had keratinized skin and so this is.   IMPRESSION:  His course appears to be satisfactory.   DISPOSITION:  I have today sharply selectively debrided the callus and  extra skin from the tip of this toe.   The patient will be sent for a repeat x-ray to see how this compares  with previous, now that he is near completion of his treatment.   He is to continue his HBO treatments until the completion of 30, at  which point another physician here will review his situation along with  his repeat x-ray and make a decision about further continuation or about  the need for long-term antibiotics or anything of that nature.           ______________________________  Jonelle Sports Cheryll Cockayne, M.D.     RES/MEDQ  D:  05/28/2009   T:  05/29/2009  Job:  [id number]

## 2011-03-30 NOTE — Assessment & Plan Note (Signed)
[facility]   NAME:  Jason [name], Jason [name]               ACCOUNT NO.:  [id number]   MEDICAL RECORD NO.:  [id number]      DATE OF BIRTH:  03-13-1941   PHYSICIAN:  Leonie Man, M.D.    VISIT DATE:  04/16/2009                                   OFFICE VISIT   PROBLEMS:  1. Bilateral venous stasis disease.  2. New ulceration of the tip of the second toe, right foot.   HISTORY:  Jason [name] is a 70 year old man with bilateral venous stasis  disease that was referred for Vein and Vascular Surgery consultation  with the intention for him undergoing saphenous vein ablation for his  venous stasis disease.  The patient was seen recently by Dr. Liliane Bade  who noted that the patient's ABIs were normal with indices greater than  1.0 and with normal wave forms.  His recommendations were for continued  [employer] treatment to this ulcer, healing to be followed by  consideration for venous ablation therapy.  On today's visit, the  patient also notes that he has been having some drainage from the second  toe of his right foot.   PHYSICAL EXAMINATION:  VITAL SIGNS:  The patient is afebrile.  Temperature 98.6, pulse 76, respirations 16, blood pressure 129/69, and  capillary blood glucose 153.  The venous stasis ulcer on the lateral aspect of the left foot is clean  and not requiring any additional debridement today and this will be  treated with an [employer] to a Kerlix and Coban.  On the right toe, the  nail at the tip of the toe is debrided.  There is an ulcer at the tip of  the toe which on probing goes to bone.  The toe itself is swollen and  insensate.   TREATMENT AND PLAN:  This was debrided cleanly and dressed with Aquacel  silver alginate gauze dressing followed by a toe sock.  The patient is  subsequently referred for x-ray of his foot which shows suspicion for  osteomyelitis of the right second toe with loss of distal phalanx and  focal demineralization at the distal aspect  of the phalanx.  Further  treatment, we will start him on Cipro and Flagyl pending confirming  cultures which were taken today.  We will also go ahead and set this  patient up to undergo hyperbaric oxygen therapy.      Leonie Man, M.D.  Electronically Signed     PB/MEDQ  D:  04/16/2009  T:  04/17/2009  Job:  [id number]

## 2011-03-30 NOTE — Procedures (Signed)
LOWER EXTREMITY VENOUS REFLUX EXAM   INDICATION:  Bilateral leg varicose veins with pain and ulcer.   EXAM:  Using color-flow imaging and pulse Doppler spectral analysis, the  right and left common femoral vein, superficial femoral vein, popliteal,  posterior tibial, greater and lesser saphenous veins are evaluated.  There is evidence suggesting deep venous insufficiency in the right and  left lower extremity with very mild reflux in the left lower extremity.   The left and right saphenofemoral junction is not competent.  The right  and left greater saphenous vein is not competent with the caliber as  described below.   The right and left proximal short saphenous vein demonstrate competency.   GSV Diameter (used if found to be incompetent only)                                            Right    Left  Proximal Greater Saphenous Vein           0.48 cm  0.43 cm  Proximal-to-mid-thigh                     0.48 cm  0.43 cm  Mid thigh                                 0.46 cm  0.43 cm  Mid-distal thigh                          0.46 cm  0.43 cm  Distal thigh                              0.50 cm  0.42 cm  Knee                                      0.46 cm  0.45 cm   IMPRESSION:  1. Right and left greater saphenous vein reflux is identified with the      caliber ranging from on the right 0.46 to 1.07 cm and on the left      is 0.45 to 1.00 on the left.  2. The right and left greater saphenous vein is not aneurysmal.  3. The right and left greater saphenous vein is not tortuous.  4. The deep venous system is not competent, with very mild in the left      leg.  5. The right and left lesser saphenous vein is competent.  6. No evidence of deep vein thrombosis noted in bilateral legs.        ___________________________________________  Larina Earthly, M.D.   MG/MEDQ  D:  05/28/2009  T:  05/29/2009  Job:  [id number]

## 2011-03-30 NOTE — Procedures (Signed)
DUPLEX DEEP VENOUS EXAM - LOWER EXTREMITY   INDICATION:  Follow up left greater saphenous vein ablation   HISTORY:  Edema:  Yes  Trauma/Surgery:  Yes  Pain:  No  PE:  No  Previous DVT:  No  Anticoagulants:  Other:   DUPLEX EXAM:                CFV   SFV   PopV  PTV    GSV                R  L  R  L  R  L  R   L  R  L  Thrombosis    o  o     o     o      o     +  Spontaneous   +  +     +     +      +     0  Phasic        +  +     +     +      +     0  Augmentation  +  +     +     +      +     0  Compressible  +  +     +     +      +     0  Competent     +  +     +     +      +     0   Legend:  + - yes  o - no  p - partial  D - decreased   IMPRESSION:  1. No evidence of deep venous thrombosis noted in the left leg.  2. The left greater saphenous vein appears ablated from the      saphenofemoral junction to below knee level.    _____________________________  Larina Earthly, M.D.   MG/MEDQ  D:  07/09/2009  T:  07/09/2009  Job:  [id number]

## 2011-03-30 NOTE — Assessment & Plan Note (Signed)
[facility]   NAME:  Jason [name], Jason [name]               ACCOUNT NO.:  [id number]   MEDICAL RECORD NO.:  [id number]      DATE OF BIRTH:  Feb 20, 1941   PHYSICIAN:  Joanne Gavel, M.D.         VISIT DATE:  03/26/2009                                   OFFICE VISIT   CHIEF COMPLAINT:  Bleeding venous ulcer.   HISTORY OF PRESENT ILLNESS:  This patient was treated with Unna boot for  a medial malleolus venous ulcer of the left leg.  This was completely  healed several weeks ago.  Starting 4 nights ago, he had an episode of  marked bleeding from the lateral malleolus of the left leg.  He went to  urgent care and he was treated with a wrap pressure, which stopped the  bleeding.   PHYSICAL EXAMINATION:  On examination now, there is a superficial 1 x 1  cm ulcer near the medial malleolus.  We are probably looking at a  thrombosed vein in the middle of this wound.   PLAN:  Dressed with [employer].  See in 7 days.  The patient is warned  about bleeding and the necessity of elevation and pressure if bleeding  should occur.      Joanne Gavel, M.D.  Electronically Signed     RA/MEDQ  D:  03/26/2009  T:  03/27/2009  Job:  [id number]

## 2011-03-30 NOTE — Assessment & Plan Note (Signed)
OFFICE VISIT   Jason [name], Jason [name]  DOB:  January 12, 1941                                       05/28/2009  CHART#:[id number]   The patient presents today for evaluation of venous hypertension.  He  had initially seen Dr. Liliane Bade in our office in May of 2010.  He, at  that time, was being treated for a left lower extremity venous ulcer in  the wound center at [facility].  He had been appropriately treated with  compression and also Unna boot.  He had 1 prior episode of left leg  venous ulcers in the past which were also successfully treated  eventually with compression and Unna boot treatment.  He now has,  fortunately, completely healed all of his ulcerations on his left leg.  He was found to have a normal arterial study at the time of his initial  evaluation.  It was discussed that he may be a candidate for other  treatment once he resolved his ulcers.  Today he underwent a noninvasive  vascular laboratory study and this did reveal reflux throughout his  great saphenous vein bilaterally.  He has very mild reflux in the left  deep system.  I discussed options with [name].  Since he does have  reflux throughout his great saphenous vein on the left and with his  episodes of recurrent ulceration despite compression therapy I have  recommended that we proceed with laser ablation and stab phlebectomy.  I  discussed the procedure is an outpatient procedure in our office.  I  explained that the goal of treatment was to reduce the recurrence and  duration of  venous ulcerations should they occur.  He understands and  wishes to proceed once we have assured insurance coverage.   Larina Earthly, M.D.  Electronically Signed   TFE/MEDQ  D:  05/28/2009  T:  05/29/2009  Job:  [id number]   cc:   Lonzo Cloud. Kriste Basque, MD

## 2011-03-30 NOTE — Assessment & Plan Note (Signed)
[facility]   NAME:  Jason [name], Jason [name]               ACCOUNT NO.:  [id number]   MEDICAL RECORD NO.:  [id number]      DATE OF BIRTH:  1941/04/17   PHYSICIAN:  Joanne Gavel, M.D.         VISIT DATE:  05/07/2009                                   OFFICE VISIT   HISTORY OF PRESENT ILLNESS:  This is a 70 year old male treated for a  ulcer of the left lower extremity who presented with a badly infected  right second toe.  This was drained.  The patient has been on HBO.  At  present, both wounds are much smaller, healing clean.  No need for  debridement.  Continue HBO.  Continue treatment with Silverlon and dry  dressings, and toe sock.  See doctor in 7 days.      Joanne Gavel, M.D.  Electronically Signed     RA/MEDQ  D:  05/07/2009  T:  05/07/2009  Job:  [id number]

## 2011-03-30 NOTE — Assessment & Plan Note (Signed)
[facility]   NAME:  Jason [name], Jason [name]               ACCOUNT NO.:  [id number]   MEDICAL RECORD NO.:  [id number]      DATE OF BIRTH:  [birth year]   PHYSICIAN:  Lenon Curt. Chilton Si, M.D.   VISIT DATE:  06/06/2009                                   OFFICE VISIT   HISTORY:  A 70 year old male with ulceration at the right foot second  toe who has undergone hyperbaric oxygen therapy and now says that there  are plans to amputate this toe due to nonhealing active osteomyelitis  was noted on right foot x-ray May 29, 2009.  Surgeon has not actually  scheduled him for this procedure.  However, he says he has been told by  the surgeon's office at [facility] that this  scheduling will be done next week.   Over the last week, there has been a significant odor developed about  the right toe.   There also was a problem with the left leg with an abrasion/skin tear  when he tripped over a flower pot on June 02, 2009.  This was wrapped in  Coban.  There also appears to be a little odor coming from this area.  Wound itself is doing okay and does not hurt any worse.   EXAM:  Capillary glucose was 165 this morning.  Left anterior leg has a  3.5 x 5.1 x 0.3 cm abrasion/skin tear.  The right second toe was  swollen, nontender, and exuding a foul smelling material from the tip  where the ulcer is.  The measurement of the second toe was 0.7 x 0.8 x  0.5 cm.   TREATMENT:  We did a tissue culture of the toe again.  We then applied  Puracol Ag and a toe sock, and the left leg had Silvadene cream,  Adaptic, Kerlix, and Coban.  The patient was started on Septra DS 1  twice daily for 10 days.  He is to return on Wednesday, June 11, 2009.   ICD-9 CODE:  707.15 ulcer of the left second toe.  707.10 ulceration of the left shin.  250.80 diabetes mellitus with ulcer.      Lenon Curt Chilton Si, M.D.  Electronically Signed     AGG/MEDQ  D:  06/06/2009  T:  06/07/2009  Job:   [id number]

## 2011-03-30 NOTE — Assessment & Plan Note (Signed)
[facility]   NAME:  Jason [name], [name]               ACCOUNT NO.:  [id number]   MEDICAL RECORD NO.:  [id number]      DATE OF BIRTH:  08-07-1941   PHYSICIAN:  Jonelle Sports. Sevier, M.D.  VISIT DATE:  04/09/2009                                   OFFICE VISIT   HISTORY:  This 70 year old black male had been followed long time for a  medial stasis ulcer of the left lower extremity.  This had healed  several weeks ago and he was placed in compression hose.  Apparently,  subsequently and without known trauma, he developed a spontaneous bleed  from a small vessel on the lateral aspect of the distal lower extremity  and required suture placement to stem the bleeding.  He has been treated  since with Unna wraps and will be seen tomorrow by the vascular surgeons  to see about possible venous ablation.   PHYSICAL EXAMINATION:  VITAL SIGNS:  Blood pressure 117/67, pulse 76,  respirations 20, temperature 98.6, and glucose 136.  The ulcer now  measures 2.0 x 0.9 x 0.1 on the distal lateral left lower extremity and  has some loose skin at its margins and a small amount of slough in its  base.   IMPRESSION:  Venous stasis ulceration, left lower extremity, improved.   DISPOSITION:  The wound is selectively debrided of the small amount of  slough and loose skin at the wound margins.   He is treated with an application of a sponge pad and placed in a  Profore wrap.  With a realization, this will be removed when he is seen  and evaluated by the vascular surgeons tomorrow.   He will return here after that visit to be rewrapped and in that visit  will be given 7-10 days subsequent to that for reevaluation here.           ______________________________  Jonelle Sports. Cheryll Cockayne, M.D.     RES/MEDQ  D:  04/09/2009  T:  04/09/2009  Job:  [id number]

## 2011-03-30 NOTE — Assessment & Plan Note (Signed)
[facility]   NAME:  Jason [name], Jason [name]               ACCOUNT NO.:  [id number]   MEDICAL RECORD NO.:  [id number]      DATE OF BIRTH:  December 16, 1940   PHYSICIAN:  Jonelle Sports. Sevier, M.D.  VISIT DATE:  03/19/2009                                   OFFICE VISIT   HISTORY:  This 70 year old black male has been seen for several weeks  for the management of a venous stasis ulceration in the medial  supramalleolar area of the left lower extremity.  His management has  included Unna wraps and he has shown rapid response.   He arrives today thinking that he is probably essentially healed and  advises that he has on hand his prescription for compression stockings  but has not yet obtained them.   PHYSICAL EXAMINATION:  VITAL SIGNS:  Blood pressure 122/79, pulse 81,  respirations 20, temperature 98.8.  EXTREMITIES:  The left lower extremity still shows some degree of  chronic edema and chronic venous stasis changes, but indeed, the ulcer  is completely healed and slightly encrusted but that will be left  undisturbed.   IMPRESSION:  Healing stasis ulceration, left lower extremity.   DISPOSITION:  1. The wound requires no direct attention today.  2. Significantly detail discussion was held with the patient about the      use of compression stockings to include obtaining his promise that      he will go directly from this facility this morning to pick up the      appropriate stockings and to begin wearing them immediately.  It is      discussed that he should wear them at any time he is on his feet,      in other words, throughout the day and that he may take them off      overnight and then maybe laundered and it would be ready for wear      again the following morning.  3. He is advised of the nature of the problem and the likelihood of      return of further ulcerations, particularly if he is not compliant.  4. He is further advised to avoid trauma to that area as with the    contralateral heel, etc.  5. The patient seems to understand all these cautions and instructions      and accordingly will be released from this clinic to return on a      p.r.n. basis only.           ______________________________  Jonelle Sports Cheryll Cockayne, M.D.     RES/MEDQ  D:  03/19/2009  T:  03/19/2009  Job:  [id number]

## 2011-03-30 NOTE — Assessment & Plan Note (Signed)
OFFICE VISIT   Jason [name], Jason [name]  DOB:  [birth year]                                       07/09/2009  ZOXWR#:[id number]   The patient presents today for follow-up of his laser ablation of left  great saphenous vein.  He did quite well following the procedure and has  had mild discomfort associated with this.   On physical exam, he does not have any skin breakdown.  He does have his  usual chronic stasis changes.   He underwent duplex today, and this shows no evidence of deep venous  thrombosis and no injury to the deep system.  He does have an ablated  saphenous vein from the knee up to the saphenofemoral junction.   I am quite pleased with his initial result, as is the patient.  We will  see him again on an as-needed basis.   Larina Earthly, M.D.  Electronically Signed   TFE/MEDQ  D:  07/09/2009  T:  07/10/2009  Job:  [id number]

## 2011-03-30 NOTE — Assessment & Plan Note (Signed)
[facility]   NAME:  Jason [name], Jason [name]               ACCOUNT NO.:  [id number]   MEDICAL RECORD NO.:  [id number]      DATE OF BIRTH:  11-10-1941   PHYSICIAN:  Joanne Gavel, M.D.         VISIT DATE:  04/02/2009                                   OFFICE VISIT   HISTORY OF PRESENT ILLNESS:  This patient has a venous ulcer 1.0 x 1.5 x  0.1 on the left lower extremity.  Last week, it was treated with an [employer].  Prior to treatment, he had an episode where he had to go to the  emergency room for spontaneous bleeding from this ulceration.   TREATMENT:  Using local anesthesia, the wound is debrided.  A large  vessel was uncovered with a great deal of bleeding.  Since I was looking  directly into the mouth of the vessel.  I figure-of-eight sutured it  with a 3-0 chromic and continued with the debridement of the base of the  wound.  Hemostasis is good at termination, and the patient will be  treated with Unna boot.  I believe will need a vascular surgeon to  decide whether or not venous surgery is indicated.      Joanne Gavel, M.D.  Electronically Signed     RA/MEDQ  D:  04/02/2009  T:  04/02/2009  Job:  [id number]

## 2011-03-30 NOTE — Assessment & Plan Note (Signed)
[facility]   NAME:  XXAVIER, NOON               ACCOUNT NO.:  [id number]   MEDICAL RECORD NO.:  [id number]      DATE OF BIRTH:  04-26-1941   PHYSICIAN:  Joanne Gavel, M.D.         VISIT DATE:  05/21/2009                                   OFFICE VISIT   HISTORY OF PRESENT ILLNESS:  A 69 year old male had drainage of a large  infection of the right second toe and started on HBO therapy.  After  debridement the wound is now clean.   PHYSICAL EXAMINATION:  VITAL SIGNS:  Temperature 98, pulse 64,  respirations 18, and blood pressure 177/74.   The right second toe wound is 0.9 x 0.7 x 0.6, it is quite clean.  Arterial Dopplers and ABIs were normal.   PLAN:  Continue treatment with puracolag, digital wrap and see in 7  days.  Continue HBO.      Joanne Gavel, M.D.  Electronically Signed     RA/MEDQ  D:  05/21/2009  T:  05/21/2009  Job:  [id number]

## 2011-03-30 NOTE — Assessment & Plan Note (Signed)
[facility]   NAME:  Jason [name], Jason [name]               ACCOUNT NO.:  [id number]   MEDICAL RECORD NO.:  [id number]      DATE OF BIRTH:  1941/05/10   PHYSICIAN:  Jonelle Sports. Sevier, M.D.  VISIT DATE:  02/26/2009                                   OFFICE VISIT   HISTORY:  This 70 year old black male was seen for the first time on  referral from Dr. Alroy Dust for treatment of a stasis ulceration on  the medial aspect of his left leg.   Apparently, he has had trouble with chronic edema in his lower  extremities with venous insufficiency for a number of years, which he  attributes to playing basketball years ago on concrete floors and things  of that nature.  At any rate in the past, he has been treated generally  with Unna wraps and has healed these.   His most recent wound appear approximately 2 weeks ago and Dr. Kriste Basque has  referred him here for our further evaluation and treatment.   The patient reports that he is indeed supposed to be wearing compression  hose, but that he has not done so for a long period of time and that the  ones he has are completely worn out.   His past medical history is largely unremarkable.  It is notable for  type 2 diabetes, mitral valve prolapse, and very little else.  He has  had nothing in the way of recent surgeries.   PERSONAL HISTORY:  The patient is married, lives alone, and is totally  independent.  He quit smoking approximately 12 years ago.  He drinks an  occasional beer.  Uses no recreational drugs.  He is retired from  [employer] and stays quite active doing chores for his family and  neighbors.   He has no known medicinal allergies.  His regular medications include  metformin 500 mg b.i.d., simvastatin 40 mg daily, baby aspirin one  daily, and calcium 600 mg daily.   PHYSICAL EXAMINATION:  Blood pressure is 129/77, pulse 73, respirations  18, temperature 98.2.  His fasting blood glucose this morning by his own  report was  135 mg/dL.   He is extremely large and well-developed black male who appears younger  than his stated age and he is in certainly in no distress.  His lower  extremities show chronic edema bilaterally from the knees downward with  stasis changes.  His pulses in his feet are quite palpable and bounding.  He has ABI of 1.2 on the right and 1.31 on the left.   On the medial supramalleolar area of that left lower extremity, there is  a small ulcer measuring 1.2 x 0.7 x 0.2 cm with rather dry base and lot  of crusting around the margins of the wound.   In addition, there are several other areas of heaped up keratinized  crusts on that extremity.   IMPRESSION:  Venous stasis ulceration, left lower extremity.   DISPOSITION:  The wound is debrided by removing the adjacent crusts and  the wound margins and also the drive base of the ulcer with some  bleeding and with a resultant nice granular base.   It will be dressed with an application of calcium alginate and that  extremity placed in an Unna wrap, which sits off on these other  hardened areas to allow [place] to address them in the subsequent visits.   He is advised to keep the dressing clean and dry, to make no changes at  home, and he is to be seen here again in 1 week.           ______________________________  Jonelle Sports. Cheryll Cockayne, M.D.     RES/MEDQ  D:  02/26/2009  T:  02/27/2009  Job:  [id number]

## 2011-03-30 NOTE — Consult Note (Signed)
VASCULAR SURGERY CONSULTATION   Jason [name], Jason [name]  DOB:  Nov 28, 1940                                       04/10/2009  EAVWU#:[id number]   REFERRING PHYSICIAN:  Joanne Gavel, M.D.   PRIMARY CARE PHYSICIAN:  Lonzo Cloud. Kriste Basque, MD.   REFERRAL DIAGNOSIS:  Left lower extremity ulcer.   HISTORY:  The patient is a 70 year old male with a history of ulceration  of his left lower extremity cared for by Dr. Wiliam Ke at the wound care  center.  He is referred today for evaluation of his arterial system.  He  has a remote history of tobacco use.  Discontinued tobacco in 1973.  He  does have type 2 diabetes and a history of hyperlipidemia.  Strong  family history of diabetes.  He did have two ulcers on his left lower  extremity, these have been treated with Unna boot and one has now healed  with a small ulcer still remaining laterally.  No history of deep venous  thrombosis.   PAST MEDICAL HISTORY:  1. Type 2 diabetes.  2. Hypertension.   SOCIAL HISTORY:  The patient is married with two grown children.  He is  retired from [employer].  He is now working part-time at the [place] [employer].  Does not smoke.  Discontinued tobacco use in 1973.  No regular alcohol  use.   REVIEW OF SYSTEMS:  Refer to patient encounter form.  The patient does  note some intermittent constipation and diarrhea.  A history of some  wheezing and bronchitis.  Some urinary frequency.  Chronic leg pain.  Lower extremity edema.  History of dizziness.   PHYSICAL EXAMINATION:  General:  A well-appearing African American male  70 years of age.  Alert and oriented.  No distress.  Vital signs:  BP  113/74, pulse 73 per minute, respirations 18 per minute.  Lower  extremities:  2+ dorsalis pedis and posterior tibial pulses bilaterally.  1+ edema of the right lower extremity, 2+ left lower extremity.  Superficial varicosities noted.  Brawny edema and pigmentation.  Small  ulcer laterally in left calf.  No erythema.   INVESTIGATIONS:  Lower extremity arterial Dopplers reveal normal flow  with ankle brachial indices greater than 1.0 bilaterally and triphasic  arterial waveforms.   IMPRESSION:  1. Chronic venous insufficiency with left lower extremity ulceration.  2. Type 2 diabetes.  3. Hyperlipidemia.   RECOMMENDATIONS:  Continue Radio broadcast assistant treatment.  No evidence of  peripheral vascular disease.  Once wounds are healed refer to vein  clinic for possible venous Doppler and ablation if superficial  incompetence is present.   Balinda Quails, M.D.  Electronically Signed  PGH/MEDQ  D:  04/10/2009  T:  04/11/2009  Job:  [id number]   cc:   Joanne Gavel, M.D.  Lonzo Cloud. Kriste Basque, MD

## 2011-03-30 NOTE — Op Note (Signed)
NAME:  Jason [name], ZOLL               ACCOUNT NO.:  [id number]   MEDICAL RECORD NO.:  [id number]          PATIENT TYPE:  AMB   LOCATION:  DAY                          FACILITY:  [facility]   PHYSICIAN:  Leonides Grills, M.D.     DATE OF BIRTH:  [birth year]   DATE OF PROCEDURE:  06/16/2009  DATE OF DISCHARGE:                               OPERATIVE REPORT   PREOPERATIVE DIAGNOSIS:  Right second toe osteomyelitis.   POSTOPERATIVE DIAGNOSIS:  Right second toe osteomyelitis.   OPERATION:  Amputation right second toe through MTP joint.   ANESTHESIA:  General.   SURGEON:  Leonides Grills, MD.   ASSISTANT:  Rexene Edison, PA-C.   ESTIMATED BLOOD LOSS:  Minimal.   TOURNIQUET:  None.   COMPLICATIONS:  None.   DISPOSITION:  Stable to PR.   INDICATIONS:  This is a 70 year old male who has osteomyelitis of his  right second toe.  He was consented for the above procedure.  All risks  of infection, nerve vessel injury, persistent infection, deep infection  may require more proximal amputation, I and D.  All explained questions  were encouraged and answered.   OPERATIVE NOTE:  The patient was brought to the operating room and  placed in the supine position.  After adequate general endotracheal  anesthesia was administered as well as Ancef gram IV piggyback, the  right lower extremity was then prepped and draped in a sterile manner  over no tourniquet, no tourniquet was used.  The right lower extremity  was prepped and draped in a sterile manner.  We performed a dorsal based  racket-shaped incision over the dorsal aspect of the right second toe.  Dissection was carried down directly to bone.  Full-thickness flaps were  developed.  Soft tissue was elevated off the base of the proximal  phalanx and the MTP joint was entered.  This  was disarticulated at this point, hemostasis was obtained.  The area was  copiously irrigated with normal saline.  Subcu was closed with 3-0  Vicryl, skin was closed with 4-0  nylon.  A sterile dressing was applied.  Hard sole shoes applied.  Patient was stable to the PR.      Leonides Grills, M.D.  Electronically Signed     PB/MEDQ  D:  06/16/2009  T:  06/16/2009  Job:  [id number]

## 2011-03-30 NOTE — Op Note (Signed)
NAME:  Jason [name], Jason [name]               ACCOUNT NO.:  [id number]   MEDICAL RECORD NO.:  [id number]          PATIENT TYPE:  AMB   LOCATION:  [facility]                         FACILITY:  [facility]   PHYSICIAN:  Sigmund I. Patsi Sears, M.D.DATE OF BIRTH:  July 10, 1941   DATE OF PROCEDURE:  07/10/2009  DATE OF DISCHARGE:                               OPERATIVE REPORT   PREOPERATIVE DIAGNOSIS:  Diabetic phimosis.   POSTOPERATIVE DIAGNOSIS:  Diabetic phimosis.   OPERATION:  Circumcision.   SURGEON:  Sigmund I. Patsi Sears, M.D.   ANESTHESIA:  General endotracheal.   PREPARATION:  After appropriate pre-anesthesia, the patient was brought  to the operating room, placed on the operating room in dorsal supine  position, where general endotracheal anesthesia was introduced.  He  remained in the supine position, where the penis was prepped with  Betadine solution and draped in the usual fashion.   HISTORY:  Mr. Charter is a 70 year old diabetic, with several month history  of burning of the foreskin, and difficulty in retracting the skin in  order to keep it clean.  Exam showed the patient had significant  phimosis, but no balanitis.  He is now for circumcision.   PROCEDURE:  Outline of circumcision is accomplished in the periglandular  and pericoronal areas.  10 mL of Marcaine 0.25 plain is injected at the  base of the penis, along the corpora cavernosa tract.  Following this,  circumcising incisions are made in the pericoronal and periglandular  markings, and the foreskin is removed with the use of the  electrosurgical unit.  Bleeding is electrocoagulated.  The patient had a  frenular attachment at the 6 o'clock position, which was clamped, cut,  and sutured with 4-0 Monocryl suture.   Four separate quadrants of 4-0 Monocryl suture were then accomplished,  each quadrant was closed with interrupted 4-0 Monocryl suture.  Dermabond was placed on the wound.  The patient was given IV Toradol,  awakened, and  taken to recovery room in good condition.      Sigmund I. Patsi Sears, M.D.  Electronically Signed     SIT/MEDQ  D:  07/10/2009  T:  07/10/2009  Job:  [id number]

## 2011-03-30 NOTE — Assessment & Plan Note (Signed)
[facility]   NAME:  Jason [name], Jason [name]               ACCOUNT NO.:  [id number]   MEDICAL RECORD NO.:  [id number]      DATE OF BIRTH:  [birth year]   PHYSICIAN:  Jonelle Sports. Sevier, M.D.  VISIT DATE:  05/14/2009                                   OFFICE VISIT   HISTORY:  This 71 year old black male is being followed for venous  stasis ulceration on the lateral aspect of the left lower extremity and  a deep abscess generating a Wagner stage III wound of the right second  toe.  That has been adequately drained, some bone removed, and he has  completed 3 weeks of antibiotic therapy.  He has been on the hyperbaric  oxygen, I believe approximately 17 treatments to this point.  This is  his weekly evaluation.  He has no particular complaints relative to that  toe and namely no increased pain, no odor, drainage, and no fever or  systemic symptoms.   PHYSICAL EXAMINATION:  Vital signs are recorded on the HBO sheet with  today's treatment are not repeated here.  The patient is in no distress.  The wound on the second toe now measures 1.4 x 1.5 cm on the surface and  probes to approximately 0.8 cm in depth with probable still penetration  to bone.  This is a little difficult to tell and I am not interested to  open wound anymore widely for certainty and that he is showing such  tremendous progress with the HBO.   The foot of the toe does remain swollen and distorted in shape but it is  not fluctuant or tender.  There is no surrounding erythema.   IMPRESSION:  1. Satisfactory course with a favorable response to hyperbaric oxygen      treatments.  2. Healed venous stasis ulceration, lateral aspect, left distal lower      extremity.   DISPOSITION:  The wound will be treated with an application of Puracol  AG additional wrap and he is to continue in a compression stocking on  that side.  He will have this changed with each HBO treatment which will  be continued 5 days weekly until  further notice and the next wound  evaluation by the physician will be 7 days hence unless indicated  sooner.           ______________________________  Jonelle Sports Cheryll Cockayne, M.D.     RES/MEDQ  D:  05/14/2009  T:  05/14/2009  Job:  [id number]

## 2011-03-30 NOTE — Assessment & Plan Note (Signed)
[facility]   NAME:  Jason [name], Jason [name]               ACCOUNT NO.:  [id number]   MEDICAL RECORD NO.:  [id number]      DATE OF BIRTH:  1941/03/29   PHYSICIAN:  Leonie Man, M.D.    VISIT DATE:  06/02/2009                                   OFFICE VISIT   PROBLEMS:  1. Diabetic foot ulcer with osteomyelitis of the second toe of the      right foot, status post hyperbaric oxygen treatments x 29 courses.  2. New wound left anterior lower extremity in this patient with venous      stasis disease.   [name] is a 70 year old gentleman currently being treated with  hyperbaric oxygen therapy for osteomyelitis and diabetic foot ulcer of  the tip of the second toe of his right foot.  The patient underwent  debridement previously and then treated with hyperbaric oxygen therapy.  His most recent x-ray done May 29, 2009, showed destructive process of  the distal and middle phalanges on the right second toe with soft tissue  swelling consistent with continued active osteomyelitis.  [name] is  not having any pain due to his diabetic neuropathy.  There is very  minimal drainage and in fact the wound looks quite good on observation  at this point.   Additionally, the patient tripped over a flower pot this morning and  developing a new wound on the anterior left leg.   PHYSICAL EXAMINATION:  Temperature 91, pulse 71, respirations 19, blood  pressure is 123/85.  Capillary blood glucose is 185.  The right second  toe wound appears on the outside clean without drainage; however, on  probing into the wound, this wound does continue to probe down to the  bone; and with the findings of the x-ray, this is quite convincing that  there is ongoing osteomyelitis in this toe.  We will continue him with  Puracol Ag and a toe sock and continue him on ciprofloxacin and Flagyl  for an additional week.  For his new partial-thickness venous leg ulcer,  which is what is now seen on his right  leg, we will treat this with  Promogran, Kerlix, and a Coban dressing to reduce edema.  I will bring  him back on Friday of this week to place him in an [employer].   DISPOSITION:  We will refer him now back to Orthopedic Surgery for  consideration of second toe amputation; and then following this, we will  bring him back for an additional ten hyperbaric oxygen treatments to  maintain the flap closure.      Leonie Man, M.D.  Electronically Signed     PB/MEDQ  D:  06/02/2009  T:  06/02/2009  Job:  [id number]   cc:   [facility]

## 2011-03-30 NOTE — Assessment & Plan Note (Signed)
[facility]   NAME:  Jason [name], Jason [name]               ACCOUNT NO.:  [id number]   MEDICAL RECORD NO.:  [id number]      DATE OF BIRTH:  1941/01/23   PHYSICIAN:  Jonelle Sports. Sevier, M.D.  VISIT DATE:  06/18/2009                                   OFFICE VISIT   HISTORY:  This 70 year old black male is seen today for followup of  stasis ulceration on the mid pretibial area of the left lower extremity  and also diabetic ulcer with osteomyelitis of the right second toe,  which we were unable to stabilize with antibiotics and HBO therapy, and  he is now status post ray amputation of that digit by Dr. Lestine Box  several days ago.   The patient reports that he is not having any particular pain and is  otherwise stable and satisfactory to his way of thinking.   Blood pressure 115/68, pulse 74, respirations 18, and temperature 98.5.  Capillary blood glucose 158 mg/dL.  The ulcer on the left pretibial area  is completely healed.  There is some dry scaly skin there which is  cautiously removed, but it certainly feels to be completely healed.   The right second toe is dressed and this will not be disturbed as he is  still under the immediate postoperative care of Dr. Lestine Box.   IMPRESSION:  1. Status post ray amputation, right second toe.  2. Stasis ulceration, left pretibial area, resolved.   DISPOSITION:  The patient will continue under the care of Dr. Lestine Box  until such time as he chooses to release him back to our followup here.   With respect to the left lower extremity, the patient has compression  stockings at home, we will return to these, and we will reappoint  himself here only if he has new difficulties.  Certainly, if he does  come back for ongoing wound care of the right foot, we can keep an eye  on his left leg as well.  Accordingly, he is given p.r.n. return to this  facility, and he promises to contact us as need be.           ______________________________  Jonelle Sports. Cheryll Cockayne, M.D.     RES/MEDQ  D:  06/18/2009  T:  06/18/2009  Job:  [id number]

## 2011-03-30 NOTE — Assessment & Plan Note (Signed)
[facility]   NAME:  Jason [name], Jason [name] NO.:  [id number]   MEDICAL RECORD NO.:  [id number]      DATE OF BIRTH:  1940-11-22   PHYSICIAN:  Joanne Gavel, M.D.         VISIT DATE:  04/30/2009                                   OFFICE VISIT   HISTORY OF PRESENT ILLNESS:  This 70 year old black male had been  treated for venous stasis of the left lower extremity and is essentially  healed there.  At his last visit, he had drainage of a serious infection  of the right second toe.  He has just had an HBO therapy.  Physical  examination of the right second toe reveals a great deal of extraneous  dead tissue including thick callus and dead tissue in the base of the  wound.  With no anesthesia, the callus was cleared and the base of the  wound is sharply dissected with scissor.  Hemostasis is good with simple  pressure.   TREATMENT:  Continue doxycycline.  Continue HBO.  I will get an ABI and  possible arterial Dopplers of the right leg as I could not feel the  pulse.      Joanne Gavel, M.D.  Electronically Signed     RA/MEDQ  D:  04/30/2009  T:  05/01/2009  Job:  [id number]

## 2011-03-30 NOTE — Assessment & Plan Note (Signed)
[facility]   NAME:  [name], [name]               ACCOUNT NO.:  [id number]   MEDICAL RECORD NO.:  [id number]      DATE OF BIRTH:  06/11/1941   PHYSICIAN:  Jonelle Sports. Sevier, M.D.  VISIT DATE:  04/24/2009                                   OFFICE VISIT   HISTORY:  This 70 year old black male had been followed for a stasis  ulcer on the left lower extremity, which had followed a spontaneous  venous rupture in that area on the distal lateral aspect of the leg.  This has been making satisfactory progress.   Unbeknownst to this clinic, he had been self treating a problem in the  tip of his right second toe and finally revealed that to the physician  here 1 week ago when he had obvious significant infection in that toe  requiring I&D and with x-ray evidence of bony involvement.  He was  started immediately on antibiotic therapy and HBO therapy.   He is seen today after HBO therapy for wound evaluation.   The examination vital signs are recorded on his hyperbaric oxygen  treatment record and are not repeated here.  They were satisfactory.   The venous wound on the left lower extremity is 0.9 x 0.7 x 0.1 cm with  some surrounding crust, but looks really quite satisfactory.   The right second toe is extremely swollen not particularly tensely so  that with clearly fluctuant area on the medial aspect of the toe and  also at the toe tip.   IMPRESSION:  Persisting deep infection with osteomyelitis, right second  toe, and resolving venous ulceration left lower extremity.   DISPOSITION:  The wound on the left lower extremity is dressed with  Silverlon pad and a Kerlix Coban wrap.  The right second toe is incised  and drained out 6-8 inches of bloody purulent material, which is  recultured, although his previous culture done last week had grown not  surprisingly staph aureus, which was MRSA.  A fair amount of dead skin  has to be removed from the distal aspect of that toe.   Following  cleansing and irrigation, the wound was then packed with silver alginate  and the toe placed in a bulky dressing.   The patient is to continue his doxycycline at present dose and to  continue his HBO therapy with the dressing to be changed daily with HBO  therapy.           ______________________________  Jonelle Sports. Cheryll Cockayne, M.D.     RES/MEDQ  D:  04/23/2009  T:  04/24/2009  Job:  [id number]

## 2011-07-16 ENCOUNTER — Inpatient Hospital Stay (HOSPITAL_COMMUNITY)
Admission: EM | Admit: 2011-07-16 | Discharge: 2011-08-07 | DRG: 336 | Disposition: A | Discharge status: Home / Self Care | Payer: Medicare Other | Attending: General Surgery | Admitting: General Surgery

## 2011-07-16 DIAGNOSIS — E785 Hyperlipidemia, unspecified: Secondary | ICD-10-CM | POA: Diagnosis present

## 2011-07-16 DIAGNOSIS — J90 Pleural effusion, not elsewhere classified: Secondary | ICD-10-CM | POA: Diagnosis not present

## 2011-07-16 DIAGNOSIS — R197 Diarrhea, unspecified: Secondary | ICD-10-CM | POA: Diagnosis not present

## 2011-07-16 DIAGNOSIS — K560 Paralytic ileus: Secondary | ICD-10-CM | POA: Diagnosis not present

## 2011-07-16 DIAGNOSIS — Z79899 Other long term (current) drug therapy: Secondary | ICD-10-CM

## 2011-07-16 DIAGNOSIS — K219 Gastro-esophageal reflux disease without esophagitis: Secondary | ICD-10-CM | POA: Diagnosis present

## 2011-07-16 DIAGNOSIS — R0989 Other specified symptoms and signs involving the circulatory and respiratory systems: Secondary | ICD-10-CM | POA: Diagnosis not present

## 2011-07-16 DIAGNOSIS — E8779 Other fluid overload: Secondary | ICD-10-CM | POA: Diagnosis not present

## 2011-07-16 DIAGNOSIS — Z6835 Body mass index (BMI) 35.0-35.9, adult: Secondary | ICD-10-CM

## 2011-07-16 DIAGNOSIS — R062 Wheezing: Secondary | ICD-10-CM | POA: Diagnosis present

## 2011-07-16 DIAGNOSIS — I059 Rheumatic mitral valve disease, unspecified: Secondary | ICD-10-CM | POA: Diagnosis present

## 2011-07-16 DIAGNOSIS — S98139A Complete traumatic amputation of one unspecified lesser toe, initial encounter: Secondary | ICD-10-CM

## 2011-07-16 DIAGNOSIS — K565 Intestinal adhesions [bands], unspecified as to partial versus complete obstruction: Principal | ICD-10-CM | POA: Diagnosis present

## 2011-07-16 DIAGNOSIS — Z87898 Personal history of other specified conditions: Secondary | ICD-10-CM

## 2011-07-16 DIAGNOSIS — I872 Venous insufficiency (chronic) (peripheral): Secondary | ICD-10-CM | POA: Diagnosis present

## 2011-07-16 DIAGNOSIS — E669 Obesity, unspecified: Secondary | ICD-10-CM | POA: Diagnosis present

## 2011-07-16 DIAGNOSIS — Y838 Other surgical procedures as the cause of abnormal reaction of the patient, or of later complication, without mention of misadventure at the time of the procedure: Secondary | ICD-10-CM | POA: Diagnosis not present

## 2011-07-16 DIAGNOSIS — Y9210 Unspecified residential institution as the place of occurrence of the external cause: Secondary | ICD-10-CM | POA: Diagnosis not present

## 2011-07-16 DIAGNOSIS — E119 Type 2 diabetes mellitus without complications: Secondary | ICD-10-CM | POA: Diagnosis present

## 2011-07-16 DIAGNOSIS — J988 Other specified respiratory disorders: Secondary | ICD-10-CM | POA: Diagnosis not present

## 2011-07-16 DIAGNOSIS — R0609 Other forms of dyspnea: Secondary | ICD-10-CM | POA: Diagnosis not present

## 2011-07-17 ENCOUNTER — Emergency Department (HOSPITAL_COMMUNITY): Payer: Medicare Other

## 2011-07-17 ENCOUNTER — Inpatient Hospital Stay (HOSPITAL_COMMUNITY): Payer: Medicare Other

## 2011-07-17 DIAGNOSIS — E119 Type 2 diabetes mellitus without complications: Secondary | ICD-10-CM

## 2011-07-17 DIAGNOSIS — K56609 Unspecified intestinal obstruction, unspecified as to partial versus complete obstruction: Secondary | ICD-10-CM

## 2011-07-17 DIAGNOSIS — R112 Nausea with vomiting, unspecified: Secondary | ICD-10-CM

## 2011-07-17 LAB — COMPREHENSIVE METABOLIC PANEL
BUN: 15 mg/dL (ref 6–23)
CO2: 29 mEq/L (ref 19–32)
Calcium: 10 mg/dL (ref 8.4–10.5)
Chloride: 98 mEq/L (ref 96–112)
Creatinine, Ser: 1.22 mg/dL (ref 0.50–1.35)
GFR calc Af Amer: >60 mL/min (ref >60)
GFR calc non Af Amer: 59 mL/min — ABNORMAL LOW (ref >60)
Total Bilirubin: 1 mg/dL (ref 0.3–1.2)

## 2011-07-17 LAB — DIFFERENTIAL
Lymphocytes Relative: 7 % — ABNORMAL LOW (ref 12–46)
Lymphs Abs: 0.6 10*3/uL — ABNORMAL LOW (ref 0.7–4.0)
Neutrophils Relative %: 85 % — ABNORMAL HIGH (ref 43–77)

## 2011-07-17 LAB — GLUCOSE, CAPILLARY
Glucose-Capillary: 152 mg/dL — ABNORMAL HIGH (ref 70–99)
Glucose-Capillary: 140 mg/dL — ABNORMAL HIGH (ref 70–99)

## 2011-07-17 LAB — CBC
HCT: 40.3 % (ref 39.0–52.0)
MCV: 93.5 fL (ref 78.0–100.0)
RBC: 4.31 MIL/uL (ref 4.22–5.81)
WBC: 8.5 10*3/uL (ref 4.0–10.5)

## 2011-07-17 MED ORDER — IOHEXOL 300 MG/ML  SOLN
100.0000 mL | Freq: Once | INTRAMUSCULAR | Status: AC | PRN
  Administered 2011-07-17: 100 mL via INTRAVENOUS

## 2011-07-18 ENCOUNTER — Inpatient Hospital Stay (HOSPITAL_COMMUNITY): Payer: Medicare Other

## 2011-07-18 LAB — CBC
HCT: 42.2 % (ref 39.0–52.0)
Hemoglobin: 13.6 g/dL (ref 13.0–17.0)
MCH: 31.3 pg (ref 26.0–34.0)
MCHC: 32.2 g/dL (ref 30.0–36.0)
MCV: 97 fL (ref 78.0–100.0)

## 2011-07-18 LAB — BASIC METABOLIC PANEL
BUN: 14 mg/dL (ref 6–23)
Creatinine, Ser: 1.15 mg/dL (ref 0.50–1.35)
GFR calc non Af Amer: >60 mL/min (ref >60)
Glucose, Bld: 127 mg/dL — ABNORMAL HIGH (ref 70–99)
Potassium: 4.2 mEq/L (ref 3.5–5.1)

## 2011-07-18 LAB — GLUCOSE, CAPILLARY
Glucose-Capillary: 111 mg/dL — ABNORMAL HIGH (ref 70–99)
Glucose-Capillary: 107 mg/dL — ABNORMAL HIGH (ref 70–99)

## 2011-07-19 ENCOUNTER — Inpatient Hospital Stay (HOSPITAL_COMMUNITY): Payer: Medicare Other

## 2011-07-19 LAB — GLUCOSE, CAPILLARY
Glucose-Capillary: 83 mg/dL (ref 70–99)
Glucose-Capillary: 90 mg/dL (ref 70–99)

## 2011-07-20 ENCOUNTER — Inpatient Hospital Stay (HOSPITAL_COMMUNITY): Payer: Medicare Other

## 2011-07-20 HISTORY — PX: LAPAROTOMY: SHX154

## 2011-07-20 LAB — GLUCOSE, CAPILLARY
Glucose-Capillary: 83 mg/dL (ref 70–99)
Glucose-Capillary: 93 mg/dL (ref 70–99)
Glucose-Capillary: 95 mg/dL (ref 70–99)
Glucose-Capillary: 98 mg/dL (ref 70–99)

## 2011-07-21 ENCOUNTER — Inpatient Hospital Stay (HOSPITAL_COMMUNITY): Payer: Medicare Other

## 2011-07-21 DIAGNOSIS — K565 Intestinal adhesions [bands], unspecified as to partial versus complete obstruction: Secondary | ICD-10-CM

## 2011-07-21 LAB — GLUCOSE, CAPILLARY
Glucose-Capillary: 106 mg/dL — ABNORMAL HIGH (ref 70–99)
Glucose-Capillary: 95 mg/dL (ref 70–99)
Glucose-Capillary: 116 mg/dL — ABNORMAL HIGH (ref 70–99)

## 2011-07-21 LAB — SURGICAL PCR SCREEN
MRSA, PCR: POSITIVE — AB
Staphylococcus aureus: POSITIVE — AB

## 2011-07-22 LAB — GLUCOSE, CAPILLARY
Glucose-Capillary: 128 mg/dL — ABNORMAL HIGH (ref 70–99)
Glucose-Capillary: 141 mg/dL — ABNORMAL HIGH (ref 70–99)
Glucose-Capillary: 118 mg/dL — ABNORMAL HIGH (ref 70–99)
Glucose-Capillary: 118 mg/dL — ABNORMAL HIGH (ref 70–99)

## 2011-07-22 LAB — CBC
HCT: 42.2 % (ref 39.0–52.0)
MCV: 97.5 fL (ref 78.0–100.0)
RBC: 4.33 MIL/uL (ref 4.22–5.81)
WBC: 7.3 10*3/uL (ref 4.0–10.5)

## 2011-07-22 LAB — BASIC METABOLIC PANEL
BUN: 10 mg/dL (ref 6–23)
CO2: 22 mEq/L (ref 19–32)
Chloride: 107 mEq/L (ref 96–112)
Creatinine, Ser: 0.97 mg/dL (ref 0.50–1.35)
Potassium: 4.7 mEq/L (ref 3.5–5.1)

## 2011-07-23 LAB — GLUCOSE, CAPILLARY
Glucose-Capillary: 118 mg/dL — ABNORMAL HIGH (ref 70–99)
Glucose-Capillary: 118 mg/dL — ABNORMAL HIGH (ref 70–99)
Glucose-Capillary: 121 mg/dL — ABNORMAL HIGH (ref 70–99)

## 2011-07-23 NOTE — Op Note (Signed)
[name] Kitchen  BURLON, CENTRELLA NO.:  [id number]  MEDICAL RECORD NO.:  [id number]  LOCATION:  1522                         FACILITY:  [facility]  PHYSICIAN:  Abigail Miyamoto, M.D. DATE OF BIRTH:  [birth year]  DATE OF PROCEDURE:  07/21/2011 DATE OF DISCHARGE:                              OPERATIVE REPORT   PREOPERATIVE DIAGNOSIS:  Small bowel obstruction.  POSTOPERATIVE DIAGNOSIS:  Small bowel obstruction.  PROCEDURE:  Exploratory laparotomy with lysis of adhesions.  SURGEON:  Abigail Miyamoto, M.D.  ASSISTANT:  Brayton El, PA-C  ANESTHESIA:  General endotracheal anesthesia.  ESTIMATED BLOOD LOSS:  Minimal.  INDICATIONS:  This is a 70-year gentleman who presents with small bowel obstruction.  He failed conservative management, so the decision made to proceed with operating room.  FINDINGS:  The patient found to have a Ladd band, creating a stricture and obstruction in the distal small bowel just proximal to the ileocecal valve.  After excision of the band, the obstruction was relieved. Resection was not needed.  PROCEDURE IN DETAIL:  The patient was brought to the operative room, identified as Jason [name].  He was placed on supine on the operating table and general anesthesia was induced.  The abdomen was then prepped and draped in usual sterile fashion.  Using a #10 blade, midline incision was then created.  This taken down the fascia with electrocautery.  The fascia, peritoneum were then opened entire length of the incision.  Upon entering the abdomen, the patient had bile-tinged appearing ascites.  He had massively dilated small bowel.  I was able to eviscerate the small bowel.  This caused lysis of a large Ladd band was coming across from the mesentery, causing a near complete obstruction of the distal ileum just proximal to the ileocecal valve.  Once the adhesion was lysed, the obstruction was relieved.  I examined the area with band then and although  there was a very slight stricture of the bowel, the bowel was viable and still had a quite large lumen.  I milked a large amount of contents through this area and saw no evidence of leak.  I again examined the rest of the entire length of the small bowel and stomach and found no other evidence of any kind of obstruction or injury.  I then milked over a liter half of these small bowel contents up into the stomach and through the nasogastric tube.  I then milked some of the contents down through the ileocecal valve and the colon as well.  This helped to partially decompress the small bowel.  I then thoroughly irrigated the abdomen with several liters normal saline. Again, no evidence of bowel injury or bowel leak was identified.  At this point, the patient's midline fascia was then reapproximated with running looped #1 PDS suture. The skin was then irrigated, closed with skin staples.  The patient tolerated the procedure well.  All counts were correct at the end of procedure.  The patient was then extubated in operating room and taken in stable condition to recovery room.     Abigail Miyamoto, M.D.     DB/MEDQ  D:  07/21/2011  T:  07/21/2011  Job:  [id number]  Electronically Signed by Abigail Miyamoto M.D. on 07/23/2011 12:37:18 PM

## 2011-07-23 NOTE — H&P (Signed)
[name] Kitchen  [name], [name] NO.:  [id number]  MEDICAL RECORD NO.:  [id number]  LOCATION:                                 FACILITY:  PHYSICIAN:  Sharlet Salina T. Donielle Radziewicz, M.D.DATE OF BIRTH:  1941/08/27  DATE OF ADMISSION:  07/17/2011 DATE OF DISCHARGE:                             HISTORY & PHYSICAL   CHIEF COMPLAINT:  Abdominal pain and distention, nausea, vomiting.  HISTORY OF PRESENT ILLNESS:  [name] is a 70 year old male who presents to [facility] with a 1-week history of gradually worsening abdominal symptoms.  He complains of progressive abdominal distention and pressure-like pain which is diffuse.  The pain is moderate intensity and constant.  Over the last several days, he has been nauseated and vomiting a clear liquid.  No hematemesis.  No bilious vomiting.  The patient has taken laxatives in an effort to relieve his symptoms and has had a few small bowel movements.  He has no fever or chills.  He has no history of any similar symptoms or chronic GI complaints.  He has had no previous abdominal surgery.  No melena or hematochezia.  PAST MEDICAL HISTORY:  He is followed for adult-onset diabetes mellitus, history of rheumatic fever, mitral valve prolapse, elevated cholesterol.  SURGERIES:  Significant only for amputation second toe of his right foot and remote tonsillectomy.  MEDICATIONS:  Metformin and simvastatin.  ALLERGIES:  No known drug allergies.  SOCIAL HISTORY:  He is married.  He is retired from [employer], but works on [employer].  He does not smoke cigarettes.  Drinks occasional alcohol.  FAMILY HISTORY:  Noncontributory.  REVIEW OF SYSTEMS:  GENERAL:  No fever, chills, weight change, malaise. HEENT:  Denies vision, hearing, swallowing problems.  RESPIRATORY:  No shortness of breath, cough, wheezing.  CARDIAC:  No recent palpitations, chest pain  ABDOMEN/GI:  As above.  GU:  He has noted decreased urination past  24 hours.  MUSCULOSKELETAL:  Some chronic joint pain and some swelling in the lower extremities.  NEUROLOGIC:  He has numbness of his feet secondary to diabetes.  PHYSICAL EXAM:  VITAL SIGNS:  Temperature is 99.6, heart rate 80, blood pressure 119/76, respirations 18. GENERAL:  He is a mildly obese African American male who appears uncomfortable. SKIN:  Warm and dry.  No rash or infection. HEENT:  No palpable mass or thyromegaly.  Oropharynx clear.  Muddy sclerae.  Pupils equal, round, reactive. LYMPH NODES:  No cervical, subclavicular, or inguinal nodes palpable. LUNGS:  Clear without wheezing or increased work of breathing. CARDIOVASCULAR:  Regular rate and rhythm.  There is a 2/6 systolic murmur.  1+ bilateral lower extremity edema.  Pedal pulses are palpable. ABDOMEN:  Very distended.  Tympanitic.  Bowel sounds are hypoactive. There is moderate diffuse tenderness, but no guarding or peritoneal signs.  No palpable hernias.  No discernible masses or organomegaly. EXTREMITIES:  Some chronic venous stasis changes lower extremities and 1+ edema bilaterally.  Healed second toe amputation on the right.  Pedal pulses palpable. NEUROLOGIC:  He is alert and fully oriented.  Decreased sensation in his feet.  LABORATORY:  Electrolytes:  BUN, creatinine normal.  Glucose 182.  LFTs normal.  White count 8.5, hemoglobin 13.7.  Flat and upright abdominal x-ray shows dilated loops of small bowel up to 5 cm consistent with mechanical small bowel obstruction.  ASSESSMENT/PLAN:  A 70 year old male with no previous abdominal surgery who presents with symptoms and x-ray findings compatible with small bowel obstruction.  Etiology is unclear.  The patient is being admitted. We will place an NG tube and start IV fluids.  He is going to have a CT scan of the abdomen and pelvis with contrast for further evaluation.     Jason [name]. Jason [name], M.D.     Tory Emerald  D:  07/17/2011  T:  07/17/2011  Job:   [id number]  Electronically Signed by Glenna Fellows M.D. on 07/23/2011 05:56:57 PM

## 2011-07-24 LAB — GLUCOSE, CAPILLARY
Glucose-Capillary: 104 mg/dL — ABNORMAL HIGH (ref 70–99)
Glucose-Capillary: 108 mg/dL — ABNORMAL HIGH (ref 70–99)
Glucose-Capillary: 110 mg/dL — ABNORMAL HIGH (ref 70–99)
Glucose-Capillary: 111 mg/dL — ABNORMAL HIGH (ref 70–99)

## 2011-07-25 LAB — GLUCOSE, CAPILLARY
Glucose-Capillary: 89 mg/dL (ref 70–99)
Glucose-Capillary: 101 mg/dL — ABNORMAL HIGH (ref 70–99)
Glucose-Capillary: 96 mg/dL (ref 70–99)

## 2011-07-26 LAB — GLUCOSE, CAPILLARY
Glucose-Capillary: 127 mg/dL — ABNORMAL HIGH (ref 70–99)
Glucose-Capillary: 130 mg/dL — ABNORMAL HIGH (ref 70–99)
Glucose-Capillary: 104 mg/dL — ABNORMAL HIGH (ref 70–99)
Glucose-Capillary: 114 mg/dL — ABNORMAL HIGH (ref 70–99)

## 2011-07-27 ENCOUNTER — Inpatient Hospital Stay (HOSPITAL_COMMUNITY): Payer: Medicare Other

## 2011-07-27 LAB — GLUCOSE, CAPILLARY

## 2011-07-27 LAB — CBC
Hemoglobin: 11.7 g/dL — ABNORMAL LOW (ref 13.0–17.0)
MCHC: 32.2 g/dL (ref 30.0–36.0)
Platelets: 217 10*3/uL (ref 150–400)
RDW: 12.8 % (ref 11.5–15.5)

## 2011-07-27 LAB — COMPREHENSIVE METABOLIC PANEL
ALT: 80 U/L — ABNORMAL HIGH (ref 0–53)
AST: 67 U/L — ABNORMAL HIGH (ref 0–37)
Albumin: 2.7 g/dL — ABNORMAL LOW (ref 3.5–5.2)
Alkaline Phosphatase: 55 U/L (ref 39–117)
Potassium: 3.9 mEq/L (ref 3.5–5.1)
Sodium: 137 mEq/L (ref 135–145)
Total Protein: 6.8 g/dL (ref 6.0–8.3)

## 2011-07-28 LAB — GLUCOSE, CAPILLARY
Glucose-Capillary: 101 mg/dL — ABNORMAL HIGH (ref 70–99)
Glucose-Capillary: 107 mg/dL — ABNORMAL HIGH (ref 70–99)
Glucose-Capillary: 109 mg/dL — ABNORMAL HIGH (ref 70–99)
Glucose-Capillary: 98 mg/dL (ref 70–99)

## 2011-07-29 LAB — BASIC METABOLIC PANEL
Calcium: 8.8 mg/dL (ref 8.4–10.5)
Creatinine, Ser: 0.75 mg/dL (ref 0.50–1.35)
GFR calc Af Amer: >60 mL/min (ref >60)

## 2011-07-29 LAB — CBC
MCH: 31.5 pg (ref 26.0–34.0)
MCHC: 32.8 g/dL (ref 30.0–36.0)
MCV: 96.2 fL (ref 78.0–100.0)
Platelets: 232 10*3/uL (ref 150–400)
RDW: 12.8 % (ref 11.5–15.5)

## 2011-07-29 LAB — MAGNESIUM: Magnesium: 1.9 mg/dL (ref 1.5–2.5)

## 2011-07-30 LAB — GLUCOSE, CAPILLARY: Glucose-Capillary: 119 mg/dL — ABNORMAL HIGH (ref 70–99)

## 2011-07-30 LAB — CLOSTRIDIUM DIFFICILE BY PCR: Toxigenic C. Difficile by PCR: NEGATIVE

## 2011-07-31 ENCOUNTER — Inpatient Hospital Stay (HOSPITAL_COMMUNITY): Payer: Medicare Other

## 2011-07-31 LAB — BASIC METABOLIC PANEL
CO2: 29 mEq/L (ref 19–32)
Calcium: 8.4 mg/dL (ref 8.4–10.5)
Glucose, Bld: 100 mg/dL — ABNORMAL HIGH (ref 70–99)
Sodium: 134 mEq/L — ABNORMAL LOW (ref 135–145)

## 2011-07-31 LAB — CBC
Hemoglobin: 11.6 g/dL — ABNORMAL LOW (ref 13.0–17.0)
MCH: 31.1 pg (ref 26.0–34.0)
MCV: 94.9 fL (ref 78.0–100.0)
RBC: 3.73 MIL/uL — ABNORMAL LOW (ref 4.22–5.81)

## 2011-07-31 LAB — GLUCOSE, CAPILLARY
Glucose-Capillary: 107 mg/dL — ABNORMAL HIGH (ref 70–99)
Glucose-Capillary: 113 mg/dL — ABNORMAL HIGH (ref 70–99)
Glucose-Capillary: 101 mg/dL — ABNORMAL HIGH (ref 70–99)

## 2011-08-01 ENCOUNTER — Inpatient Hospital Stay (HOSPITAL_COMMUNITY): Payer: Medicare Other

## 2011-08-01 LAB — GLUCOSE, CAPILLARY
Glucose-Capillary: 102 mg/dL — ABNORMAL HIGH (ref 70–99)
Glucose-Capillary: 129 mg/dL — ABNORMAL HIGH (ref 70–99)

## 2011-08-01 LAB — BASIC METABOLIC PANEL
Calcium: 8.7 mg/dL (ref 8.4–10.5)
Chloride: 101 mEq/L (ref 96–112)
Creatinine, Ser: 0.78 mg/dL (ref 0.50–1.35)
GFR calc Af Amer: >60 mL/min (ref >60)
GFR calc non Af Amer: >60 mL/min (ref >60)

## 2011-08-02 ENCOUNTER — Inpatient Hospital Stay (HOSPITAL_COMMUNITY): Payer: Medicare Other

## 2011-08-02 LAB — GLUCOSE, CAPILLARY
Glucose-Capillary: 84 mg/dL (ref 70–99)
Glucose-Capillary: 135 mg/dL — ABNORMAL HIGH (ref 70–99)
Glucose-Capillary: 118 mg/dL — ABNORMAL HIGH (ref 70–99)

## 2011-08-02 LAB — BASIC METABOLIC PANEL
BUN: 5 mg/dL — ABNORMAL LOW (ref 6–23)
CO2: 28 mEq/L (ref 19–32)
Chloride: 101 mEq/L (ref 96–112)
Creatinine, Ser: 0.77 mg/dL (ref 0.50–1.35)
Glucose, Bld: 94 mg/dL (ref 70–99)

## 2011-08-03 ENCOUNTER — Inpatient Hospital Stay (HOSPITAL_COMMUNITY): Payer: Medicare Other

## 2011-08-03 DIAGNOSIS — K219 Gastro-esophageal reflux disease without esophagitis: Secondary | ICD-10-CM

## 2011-08-03 DIAGNOSIS — R061 Stridor: Secondary | ICD-10-CM

## 2011-08-03 LAB — GLUCOSE, CAPILLARY
Glucose-Capillary: 113 mg/dL — ABNORMAL HIGH (ref 70–99)
Glucose-Capillary: 109 mg/dL — ABNORMAL HIGH (ref 70–99)

## 2011-08-04 ENCOUNTER — Inpatient Hospital Stay (HOSPITAL_COMMUNITY): Payer: Medicare Other

## 2011-08-04 LAB — GLUCOSE, CAPILLARY
Glucose-Capillary: 120 mg/dL — ABNORMAL HIGH (ref 70–99)
Glucose-Capillary: 98 mg/dL (ref 70–99)
Glucose-Capillary: 156 mg/dL — ABNORMAL HIGH (ref 70–99)

## 2011-08-05 LAB — GLUCOSE, CAPILLARY
Glucose-Capillary: 132 mg/dL — ABNORMAL HIGH (ref 70–99)
Glucose-Capillary: 164 mg/dL — ABNORMAL HIGH (ref 70–99)
Glucose-Capillary: 136 mg/dL — ABNORMAL HIGH (ref 70–99)
Glucose-Capillary: 143 mg/dL — ABNORMAL HIGH (ref 70–99)

## 2011-08-06 LAB — GLUCOSE, CAPILLARY
Glucose-Capillary: 126 mg/dL — ABNORMAL HIGH (ref 70–99)
Glucose-Capillary: 121 mg/dL — ABNORMAL HIGH (ref 70–99)
Glucose-Capillary: 160 mg/dL — ABNORMAL HIGH (ref 70–99)
Glucose-Capillary: 138 mg/dL — ABNORMAL HIGH (ref 70–99)

## 2011-08-07 LAB — GLUCOSE, CAPILLARY: Glucose-Capillary: 157 mg/dL — ABNORMAL HIGH (ref 70–99)

## 2011-08-13 ENCOUNTER — Ambulatory Visit: Payer: Medicare Other | Admitting: Pulmonary Disease

## 2011-08-17 ENCOUNTER — Ambulatory Visit (INDEPENDENT_AMBULATORY_CARE_PROVIDER_SITE_OTHER): Payer: Medicare Other | Admitting: Surgery

## 2011-08-17 ENCOUNTER — Encounter (INDEPENDENT_AMBULATORY_CARE_PROVIDER_SITE_OTHER): Payer: Self-pay | Admitting: Surgery

## 2011-08-17 VITALS — BP 124/76 | HR 88 | Temp 96.9°F | Resp 18 | Ht >= 80 in | Wt 298.4 lb

## 2011-08-17 DIAGNOSIS — Z09 Encounter for follow-up examination after completed treatment for conditions other than malignant neoplasm: Secondary | ICD-10-CM

## 2011-08-17 DIAGNOSIS — K56609 Unspecified intestinal obstruction, unspecified as to partial versus complete obstruction: Secondary | ICD-10-CM

## 2011-08-17 NOTE — Progress Notes (Signed)
Subjective:     Patient ID: Jason [name], male   DOB: [birth year], 70 y.o.   MRN: [id number]  HPI He is here for a postoperative visit status post lysis of adhesions for a bowel obstruction on September 5. He is doing well and has no complaints. He has a small open wound which he is treating with dry dressings. He is eating well and moving his bowels well  Review of Systems     Objective:   Physical Exam On exam, his incision is totally healed except for a very small area with good granulation tissue. There was no purulence. I treated this area with silver nitrate.    Assessment:     Patient status post lysis of adhesions for bowel    Plan:     He will continue his local wound care. He will refrain from heavy lifting for 2 more weeks. I will see him back in 2-3 weeks

## 2011-08-21 NOTE — Consult Note (Addendum)
[name] Kitchen  DREVIN, ORTNER NO.:  [id number]  MEDICAL RECORD NO.:  [id number]  LOCATION:  1522                         FACILITY:  [facility]  PHYSICIAN:  Angelia Mould. Derrell Lolling, M.D.DATE OF BIRTH:  06/04/1941  DATE OF CONSULTATION: DATE OF DISCHARGE:  08/06/2011                                CONSULTATION   HISTORY OF PRESENT ILLNESS:  Mr. Claunch is a 70 year old African American gentleman who presented with abdominal pain, distention, and nausea and vomiting.  He states that his symptoms were gradually worsening and presented himself to the [facility] Emergency Department.  He had been trying to take some laxative since he has had some few bowel movements, but it was not having any effect.  His presentation to the emergency department prompted significant workup including plain films which showed evidence of dilated loops of small bowel, consistent with at least partial if not complete small bowel obstruction.  He had an underlying history of diabetes mellitus, history of mitral valve prolapse, hyperlipidemia.  He had previously not had any abdominal surgery.  After being evaluated by Dr. Johna Sheriff, the patient was admitted for attempted conservative management for partial bowel obstruction.  SUMMARY OF HOSPITAL COURSE:  The patient was admitted on July 17, 2011, by Dr. Glenna Fellows.  He had an NG tube placed and was admitted under bowel rest.  The patient really never made any significant progress, had serial x-rays which showed persistent evidence of high-grade bowel obstruction without evidence of free air.  After several days of conservative management, the decision was made to take the patient to the operating room.  He underwent exploratory laparotomy with lysis of adhesion, consistent with possibly a Lap-Band down the right lower quadrant by Dr. Magnus Ivan on July 21, 2011.  He was then transferred back to the floor in stable condition.  Postoperatively,  the patient had significant postoperative ileus with very slow return of function of his bowel.  He then continued to have serial x-rays which did show continued evidence of ileus and persistent bowel obstruction. However, he slowly began to have return of function of his bowels.  He became less nauseated and started passing some flatus.  His belly softened and he was actually started on liquid diet, gradually advanced to regular diet.  He finally had bowel movement with the aid of several doses of MiraLax and quite a bit of activity, but was feeling significantly better.  Of note, the patient did develop some cough and he had aggressive pulmonary toilet and several cough agents use.  Chest x-rays showed evidence of pleural effusion on the right with residual atelectatic changes.  His most recent x-ray on September 19th showed interval decrease in the small right-sided pleural effusion with stable bibasilar atelectasis changes.  No evidence of infiltrate or pneumonia was present on the final films.  His x-ray still showed evidence consistent with bowel obstruction or ileus, but were seen to be improving.  Again after the patient was proven to make clinical progress, tolerating regular diet, and having positive bowel function, it was determined he was stable for discharge.  DISCHARGE DIAGNOSES: 1. Small bowel obstruction secondary to the Lab-Band. 2. Postoperative ileus - resolved. 3.  Diabetes mellitus - stable. 4. Obesity.  DISCHARGE MEDICATIONS: 1. DuoNeb 2 puffs q.6 h. p.r.n. wheezing. 2. Ensure 1 can 3 times daily with each meal. 3. Flora-Q 1 capsule daily. 4. Hycodan cough syrup for equivalent 1 teaspoon q.6 h. p.r.n. 5. Percocet 1-2 tablets q.4 h. p.r.n. needed for pain. 6. Aspirin 81 mg daily. 7. Calcium once daily. 8. Amaryl 1 mg half tablet daily. 9. Metformin 500 mg 1 tablet twice daily. 10.MiraLax daily as needed for bowel movements. 11.Simvastatin 40 mg  daily. 12.Vitamin C once daily.  He is given discharge instructions to follow up with Dr. Magnus Ivan in approximately 2 weeks' time.  He can call Dr. Kriste Basque for questions or concerns regarding any residual respiratory complaints.     Brayton El, PA-C   ______________________________ Angelia Mould. Derrell Lolling, M.D.    KB/MEDQ  D:  08/06/2011  T:  08/06/2011  Job:  [id number]  Electronically Signed by Brayton El  on 08/11/2011 03:51:15 PM Electronically Signed by Claud Kelp M.D. on 08/21/2011 04:00:27 PM

## 2011-08-25 ENCOUNTER — Telehealth: Payer: Self-pay | Admitting: Pulmonary Disease

## 2011-08-25 NOTE — Telephone Encounter (Signed)
I spoke with pt wife and she states pt needs a hfu next w/ sn. I advised her he did not have any openings. Pt is coming in to see TP 09/02/11 at 4:15 for HFU

## 2011-08-30 ENCOUNTER — Encounter: Payer: Self-pay | Admitting: Pulmonary Disease

## 2011-08-30 ENCOUNTER — Ambulatory Visit (INDEPENDENT_AMBULATORY_CARE_PROVIDER_SITE_OTHER): Payer: Medicare Other | Admitting: Pulmonary Disease

## 2011-08-30 ENCOUNTER — Telehealth (INDEPENDENT_AMBULATORY_CARE_PROVIDER_SITE_OTHER): Payer: Self-pay | Admitting: Surgery

## 2011-08-30 DIAGNOSIS — Z23 Encounter for immunization: Secondary | ICD-10-CM

## 2011-08-30 DIAGNOSIS — K5730 Diverticulosis of large intestine without perforation or abscess without bleeding: Secondary | ICD-10-CM

## 2011-08-30 DIAGNOSIS — M1990 Unspecified osteoarthritis, unspecified site: Secondary | ICD-10-CM

## 2011-08-30 DIAGNOSIS — K219 Gastro-esophageal reflux disease without esophagitis: Secondary | ICD-10-CM

## 2011-08-30 DIAGNOSIS — E7800 Pure hypercholesterolemia, unspecified: Secondary | ICD-10-CM

## 2011-08-30 DIAGNOSIS — E119 Type 2 diabetes mellitus without complications: Secondary | ICD-10-CM

## 2011-08-30 DIAGNOSIS — I059 Rheumatic mitral valve disease, unspecified: Secondary | ICD-10-CM

## 2011-08-30 DIAGNOSIS — E669 Obesity, unspecified: Secondary | ICD-10-CM

## 2011-08-30 DIAGNOSIS — K56609 Unspecified intestinal obstruction, unspecified as to partial versus complete obstruction: Secondary | ICD-10-CM

## 2011-08-30 DIAGNOSIS — D126 Benign neoplasm of colon, unspecified: Secondary | ICD-10-CM

## 2011-08-30 DIAGNOSIS — I872 Venous insufficiency (chronic) (peripheral): Secondary | ICD-10-CM

## 2011-08-30 DIAGNOSIS — Z87898 Personal history of other specified conditions: Secondary | ICD-10-CM

## 2011-08-30 NOTE — Patient Instructions (Signed)
Today we updated your med list in EPIC...    Continue your current meds the same...  We reviewed your recent [place] records, [place], & blood work...  For the cough:    You may use the OTC DELSYM cough syrup, and the MUCINEX tabs as needed...  We gave you the 2012 FLU vaccine today...  Call for any problems...  Let's plan a follow up visit in 6 months w/ FASTING blood work at that time.Marland KitchenMarland Kitchen

## 2011-08-30 NOTE — Progress Notes (Signed)
Subjective:    Patient ID: Jason [name], male    DOB: 1941/06/23, 70 y.o.   MRN: [id number]  HPI  70 y/o BM here for a follow up visit... he has mult med problems including:  MVP;  Chr Ven Insuffic;  Hyperchol;  DM;  Obesity;  GERD/ Divertics/ IBS/ Colon Polyps;  BPH;  DJD...  ~  August 11, 2010:  Glimep1mg  has helped w/ BS=124, A1c=5.8 now (we will decr to 1/2 tab)... he has also lost 13# on diet (down to 319# now)... generally stable w/o CP, palpit, ch in SOB, edema, etc... he had colonoscopy 5/11 DrStark w/ divertics & 4mm polyp= tubular adenoma- f/u planned 72yrs.  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    CV>  On ASA daily & 2DEcho 10/11 showed mild LVH, norm LVF w/ EF= 55-60%, ant leaflet MVP seen w/ MR, mod LAdil;  Post-op SOB is improved, denies CP, palpit, edema...    Chol>  On Simva40 (taking 1/2 of the 80) and last FLP shows TChol 141, TG 55, HDL 50, LDL 81;  Looks good- keep same dose + better diet & get wt down!    DM>  On Metformin 500Bid & Glimep 1mg - 1/2Qam;  BS at home are OK per pt & denies low sugar events;  Hosp labs 121-157 and we reviewed diet recommendations...    Obesity>  weight is down 17# to 310# today; we reviewed the need to continue the wt reducting diet + exercise program...    GI>  Adm 9/12 w/ SBO & had ELap w/ lysis of adhesions;  Now  improved w/o abd pain, N/V/D/C/ or blood seen...    GU>  He saw DrTannenbaum et al 3/12 for f/u BPH, ED, & PSA recheck;  Voiding satis, given Cialis & PSA= 0.47 (he had 58cc PVR- not on meds at present)...    DJD/ LBP>  C/o LBP w/ walking, no radiation;  He declines further eval at this point, prefers exerc program, heat, aleve Prn...             Problem List:  Hx of SINUSITIS (ICD-473.9)  Hx Pneumonia - seen in ER 7/09 w/ faint RLL infiltrate that cleared serially w/ antibiotic Rx...  MITRAL VALVE PROLAPSE (ICD-424.0) - on ASA 81mg /d... he denies CP, palpit, dyspnea... exam w/ MR murmur... ~  2DEcho 5/01 showed mild ant leaflet MVP, mild MR, norm LVF. ~  f/u 2DEcho 10/11 = pending...  VENOUS INSUFFICIENCY (ICD-459.81) - he knows to elim salt, elevate legs, wear TED's... ~  neg ven dopplers in 1996...  ~  he saw DrLawson in 2000 for a ven stasis ulcer on his leg... rec for TED's, no salt,  elevation, etc...  ~  he had normal ABI's in 10/06... ~  3/10:  left leg wound w/ cellulitis & +MRSA... Rx'd and refer back to wound care clinic. ~  7/10:  sched for right second toe amputation due to osteomyelitis despite hyperbaric O2 etc... ~  8/10: s/p right second toe amp for osteomyelitis Lestine Box), and s/p laser ablation of left greater saphenous vein (DrEarly).  HYPERCHOLESTEROLEMIA (ICD-272.0) - changed to SIMVASTATIN 80mg /d last yr... due for f/u FLP. ~  FLP 9/08 showed TChol 145, TG 54, HDL 46, LDL 88 ~  FLP 9/09 showed TChol 144, Tg 65, HDL 48, LDL 83 ~  FLP 9/10 showed TChol 146, TG 52, HDL 51, LDL 85  DIABETES MELLITUS (ICD-250.00) - on METFORMIN 500mg  Bid + GLIMEPIRIDE 1mg /d, and diet efforts... ~  labs 9/08 showed BS=123, HgA1c=6.0.Marland Kitchen. urine microalb was neg... ~  labs 9/09 showed BS= 144, HgA1c= 6.1.Marland Kitchen. rec> same med, better diet. ~  labs 3/10 showed BS= 147, HgA1c= 6.9.Marland Kitchen. rec> incr the MetformBid, but he never did. ~  7/10: he states the wound care clinic increased his glucose intake  due to Hyperbaric O2 Rx... we increased his Metform500 Bid. ~  labs 9/10 on Metform500Bid showed BS= 154, A1c= 6.4 ~  labs 3/11 on Metform500Bid showed BS= 192, A1c= 7.5.Marland Kitchen. rec> add Glimep 1mg /d. ~  labs 9/11 on Metform500Bid+Glim1 showed BS= 124, A1c= 5.8.Marland Kitchen. rec> decr Glimep1mg  to 1/2 tab daily...  OBESITY (ICD-278.00) - he is very large at 6\' 8"  tall and >300# for the last 10+ yrs... peak = 340#... he drinks beer daily... discussed diet + exercise program (and no beer)... ~  he had transient right gynecomastia 9/09, no discharge, neg mammogram & resolved on it's own... ~  weight 3/11 = 333# ~  weight 9/11 = 319#  GERD (ICD-530.81)  DIVERTICULOSIS OF COLON (ICD-562.10) / IRRITABLE BOWEL SYNDROME (ICD-564.1) & COLONIC POLYPS (ICD-211.3) ~  colonoscopy 3/06 by DrStark showing divertics & 2-48mm polyps (adenomas)... ~  colonoscopy 9/11 by DrStark showed divertics & 4mm polyp= tubular adenoma... f/u planned 42yrs.  BENIGN PROSTATIC HYPERTROPHY, HX OF (ICD-V13.8) - eval by DrTannenbaum 7/08 and Rx w/ Cialis... he has tried the PEP shots... + fam hx prostate ca in his father... ~  8/10:  DrTannenbaum did circ for phimosis, pt states that PSA check was OK...  DEGENERATIVE JOINT DISEASE (ICD-715.90) - he uses OTC meds Prn, and was rec to start Vit supplements by DrTannenbaum- takes MVI, VitC, VitD... he has seen DrHilts in the past...  Hx of HIDRADENITIS (ICD-705.83)  Health Maintenance - up to date on colon & prostate screens... PNEUMOVAX 9/09... TETANUS booster 3/10... gets yearly Flu vaccines in Fall.   Past Surgical History  Procedure Date  . Knee arthroscopy   . Toe amputation 06/2009    Right foot second toe -Dr Lestine Box  . Laser ablation 06/2009    Left Greater Saphenous vein -Dr Arbie Cookey  . Circumcision 06/2100    For Phimosis - Dr Marcello Fennel  . Laparotomy 07/20/11  . Colon surgery     Outpatient Encounter Prescriptions as of 08/30/2011  Medication Sig Dispense Refill  . aspirin 81 MG  tablet Take 81 mg by mouth daily.        . Calcium Carbonate (CALCIUM 500 PO) Take 1 tablet by mouth daily.        . cholecalciferol (VITAMIN D-400) 400 UNITS TABS Take 400 Units by mouth daily.        . fluocinonide (LIDEX) 0.05 % cream  Apply to affected area as needed       . glimepiride (AMARYL) 1 MG tablet Take 1/2 tablet by mouth every morning  30 tablet  6  . metFORMIN (GLUCOPHAGE) 500 MG tablet Take 1 tablet (500 mg total) by mouth 2 (two) times daily with a meal.  180 tablet  3  . simvastatin (ZOCOR) 40 MG tablet Take 1 tablet (40 mg total) by mouth every evening.  90 tablet  3  . Blood Glucose Monitoring Suppl (ONE TOUCH ULTRA SYSTEM KIT) W/DEVICE KIT Use as directed to check blood sugar         No Known Allergies   Current Medications, Allergies, Past Medical History, Past Surgical History, Family History, and Social History were reviewed in [employer] record.    Review of Systems         See HPI - all other systems neg except as noted...  The patient denies anorexia, fever, weight loss, weight gain, vision loss, decreased hearing, hoarseness, chest pain, syncope, dyspnea on exertion, peripheral edema, prolonged cough, headaches, hemoptysis, abdominal pain, melena, hematochezia, severe indigestion/heartburn, hematuria, incontinence, muscle weakness, suspicious skin lesions, transient blindness, difficulty walking, depression, unusual weight change, abnormal bleeding, enlarged lymph nodes, and angioedema.     Objective:   Physical Exam     WD, Overweight, very large 70 y/o BM in NAD... GENERAL:  Alert & oriented; pleasant & cooperative. HEENT:  [place]/AT, EOM-wnl, PERRLA, EACs-clear, TMs-wnl, NOSE-clear, THROAT-clear & wnl. NECK:  Supple w/ fairROM; no JVD; normal carotid impulses w/o bruits; no thyromegaly or nodules palpated; no lymphadenopathy. CHEST:  Clear to P & A; without wheezes/ rales/ or rhonchi heard... HEART:  Regular Rhythm; gr 1/6 SEM without  rubs or gallops detected... ABDOMEN:  Soft & nontender; normal bowel sounds; no organomegaly or masses detected. EXT: without deformities, mod arthritic changes; wearing TEDs +varicose veins/ +venous insuffic/ chr 1+ edema... NEURO:  CN's intact;  no focal neuro deficits... Derm:  Mild stasis changes in LEs w/o cellulitis etc...   Assessment & Plan:   Hx Sinusitis/ Hx Pneumonia>  During the 9/12 hosp for SBO he developed an ileus & pleural effusion which resolved serially...  MVP>  Mild MVP on 2DEcho & he denies CP, palpit, ch in SOB, edmea, etc...  Ven Insuffic>  He knows to elim sodium, elevate legs, wear support hose, etc...  Chol>  On Simva40 + diet; needs ret for FLP soon...  DM>  Controlled on Metformin & Glimepiride; wt reduction has helped & he is encouraged to continue diet + exercise efforts...  Obesity>  As noted, wt down to 310# after his hosp for SBO; we discussed diet & exercise...  GI> s/p SBO w/ surg, Hx GERD/ Divertics/ IBS/ Polyps> GI per DrStark & post-op DrBlackman...  GU> BPH followed by DrTannenbaum...  DJD> on OTC analgesics as needed.Marland KitchenMarland Kitchen

## 2011-08-31 ENCOUNTER — Ambulatory Visit (INDEPENDENT_AMBULATORY_CARE_PROVIDER_SITE_OTHER): Payer: Medicare Other | Admitting: Surgery

## 2011-08-31 ENCOUNTER — Encounter (INDEPENDENT_AMBULATORY_CARE_PROVIDER_SITE_OTHER): Payer: Self-pay | Admitting: Surgery

## 2011-08-31 VITALS — BP 119/83 | HR 82 | Temp 98.3°F | Resp 17 | Ht >= 80 in | Wt 309.0 lb

## 2011-08-31 DIAGNOSIS — Z09 Encounter for follow-up examination after completed treatment for conditions other than malignant neoplasm: Secondary | ICD-10-CM

## 2011-08-31 NOTE — Progress Notes (Signed)
Subjective:     Patient ID: Jason [name], male   DOB: Mar 28, 1941, 70 y.o.   MRN: [id number]  HPI He is here for another postoperative visit. He is doing well. He has no complaints. He has returned to work.  Review of Systems     Objective:   Physical Exam    On exam, his incision is well-healed. Assessment:     Patient status post exploratory laparotomy for small bowel traction    Plan:     He may return normal activity. He will followup as needed

## 2011-09-02 ENCOUNTER — Inpatient Hospital Stay: Payer: Medicare Other | Admitting: Adult Health

## 2011-09-05 ENCOUNTER — Encounter: Payer: Self-pay | Admitting: Pulmonary Disease

## 2011-09-13 ENCOUNTER — Encounter: Payer: Self-pay | Admitting: Pulmonary Disease

## 2011-11-25 ENCOUNTER — Ambulatory Visit (INDEPENDENT_AMBULATORY_CARE_PROVIDER_SITE_OTHER): Payer: Medicare Other | Admitting: Pulmonary Disease

## 2011-11-25 ENCOUNTER — Encounter: Payer: Self-pay | Admitting: Pulmonary Disease

## 2011-11-25 DIAGNOSIS — E119 Type 2 diabetes mellitus without complications: Secondary | ICD-10-CM

## 2011-11-25 DIAGNOSIS — E7800 Pure hypercholesterolemia, unspecified: Secondary | ICD-10-CM

## 2011-11-25 DIAGNOSIS — K589 Irritable bowel syndrome without diarrhea: Secondary | ICD-10-CM

## 2011-11-25 DIAGNOSIS — K5730 Diverticulosis of large intestine without perforation or abscess without bleeding: Secondary | ICD-10-CM

## 2011-11-25 DIAGNOSIS — Z87898 Personal history of other specified conditions: Secondary | ICD-10-CM

## 2011-11-25 DIAGNOSIS — K219 Gastro-esophageal reflux disease without esophagitis: Secondary | ICD-10-CM

## 2011-11-25 DIAGNOSIS — I872 Venous insufficiency (chronic) (peripheral): Secondary | ICD-10-CM

## 2011-11-25 DIAGNOSIS — D126 Benign neoplasm of colon, unspecified: Secondary | ICD-10-CM

## 2011-11-25 DIAGNOSIS — I059 Rheumatic mitral valve disease, unspecified: Secondary | ICD-10-CM

## 2011-11-25 DIAGNOSIS — E669 Obesity, unspecified: Secondary | ICD-10-CM

## 2011-11-25 DIAGNOSIS — M1990 Unspecified osteoarthritis, unspecified site: Secondary | ICD-10-CM

## 2011-11-25 NOTE — Progress Notes (Signed)
Subjective:    Patient ID: Jason [name], male    DOB: [birth year], 71 y.o.   MRN: [id number]  HPI 71 y/o BM here for a follow up visit... 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     ~  August 30, 2011:  70mo ROV & post HOSP visit> Adm 9/12 for SBO & required ELap w/ lysis of adhesions by DrBlackman, CCS; post-op problems included ileus, cough, pleural effusion, & minor wound complic managed as outpt & now back to baseline...  ~  November 25, 2011:  32mo ROV & he reports stable, just ate too much over the holidays and his wt is up 19# to 329# today; we reviewed diet, exercise & wt reduction strategies "I'm going to work on it"... He is not fasting today & will ret next week for full fasting labs>>    MVP>  On ASA daily & 2DEcho 10/11 showed mild LVH, norm LVF w/ EF= 55-60%, ant leaflet MVP seen w/ MR, mod LAdil; he denies CP, palpit, SOB, edema...    Ven Insuffic>  Hx VV, VI, stasis changes w/ ulcer/ cellulitis in past; s/p laser to left GSV by DrEarly & s/p right 2nd toe amp for osteomyelitis by Volney Presser 2010.    Chol>  On Simva40 (taking 1/2 of the 80) and last FLP 3/12 showed TChol 141, TG 55, HDL 50, LDL 81... keep same dose + better diet & get wt down!    DM>  On Metformin 500Bid & Glimep 1mg - 1/2Qam; BS at home are sl up w/ wt gain per pt; f/u BS/ A1c ==> pending.    Obesity>  weight is back up 19# to 329# today; we reviewed the critical need for wt reducting diet + exercise program...    GI>  Adm 9/12 w/ SBO & had ELap w/ lysis of adhesions;  Now improved & back to  baseline w/o abd pain, N/V/D/C/ or blood seen...    GU>  He saw DrTannenbaum et al 3/12 for f/u BPH, ED, & PSA recheck;  Voiding satis, given Cialis & PSA= 0.47 (he had 58cc PVR- not on meds at present)...    DJD/ LBP>  C/o LBP w/ walking, no radiation;  He declines further eval at this point, prefers exerc program, heat, aleve Prn...    Anxiety>  He related difficulty caring for family esp elderly mother who is quite demanding & conflict w/ his daughter/ her 2 kids/ etc...          Problem List:  Hx of SINUSITIS (ICD-473.9)  Hx Pneumonia - seen in ER 7/09 w/ faint RLL infiltrate that cleared serially w/ antibiotic Rx...  MITRAL VALVE PROLAPSE (ICD-424.0) - on ASA 81mg /d... he denies CP, palpit, dyspnea... exam w/ MR murmur... ~  2DEcho 5/01 showed mild ant leaflet MVP, mild MR, norm LVF. ~  f/u 2DEcho 10/11 showed mild LVH, norm LVF w/ EF= 55-60%, ant leaflet MVP seen w/ MR,  mod LAdil  VENOUS INSUFFICIENCY (ICD-459.81) - he knows to elim salt, elevate legs, wear TED's... ~  neg ven dopplers in 1996...  ~  he saw DrLawson in 2000 for a ven stasis ulcer on his leg... rec for TED's, no salt, elevation, etc...  ~  he had normal ABI's in 10/06... ~  3/10:  left leg wound w/ cellulitis & +MRSA... Rx'd and refer back to wound care clinic. ~  7/10:  sched for right second toe amputation due to osteomyelitis despite hyperbaric O2 etc... ~  8/10: s/p right second toe amp for osteomyelitis Lestine Box), and s/p laser ablation of left greater saphenous vein (DrEarly).  HYPERCHOLESTEROLEMIA (ICD-272.0) - changed to SIMVASTATIN 80mg - 1/2 tab Qhs... ~  FLP 9/08 showed TChol 145, TG 54, HDL 46, LDL 88 ~  FLP 9/09 showed TChol 144, Tg 65, HDL 48, LDL 83 ~  FLP 9/10 showed TChol 146, TG 52, HDL 51, LDL 85 ~  FLP 3/12 showed TChol 141, TG 55, HDL 50, LDL 81... keep same dose + better diet & get wt down! ~  FLP 1/13 on Simva40 ==> pending  DIABETES MELLITUS (ICD-250.00) - on METFORMIN 500mg  Bid +  GLIMEPIRIDE 1mg - 1/2 tab, and diet efforts... ~  labs 9/08 showed BS=123, HgA1c=6.0.Marland Kitchen. urine microalb was neg... ~  labs 9/09 showed BS= 144, HgA1c= 6.1.Marland Kitchen. rec> same med, better diet. ~  labs 3/10 showed BS= 147, HgA1c= 6.9.Marland Kitchen. rec> incr the MetformBid, but he never did. ~  7/10: he states the wound care clinic increased his glucose intake due to Hyperbaric O2 Rx... we increased his Metform500 Bid. ~  labs 9/10 on Metform500Bid showed BS= 154, A1c= 6.4 ~  labs 3/11 on Metform500Bid showed BS= 192, A1c= 7.5.Marland Kitchen. rec> add Glimep 1mg /d. ~  labs 9/11 on Metform500Bid+Glim1 showed BS= 124, A1c= 5.8.Marland Kitchen. rec> decr Glimep1mg  to 1/2 tab daily (he never did). ~  Labs 3/12 on Metform500Bid+Glim1 showed BS= 114, A1c= 5.9.Marland KitchenMarland Kitchen Rec> decr Glim1mg - to 1/2 tab. ~  Labs in [facility] 9/12 SBO showed BS betw 78 - 164 ~  Labs 1/13 on Metform500Bid+Glim0.5 showed ==> pending  OBESITY (ICD-278.00) - he is very large at 6\' 8"  tall and >300# for the last 10+ yrs... peak = 340#... he drinks beer daily... discussed diet + exercise program (and no beer)... ~  he had transient right gynecomastia 9/09, no discharge, neg mammogram & resolved on it's own... ~  weight 3/11 = 333# ~  weight 9/11 = 319# ~  Weight 3/12 = 327# ~  Weight 10/12 = 310#  post hosp for SBO... ~  Weight 1/13 = 329#  GERD (ICD-530.81)  DIVERTICULOSIS OF COLON (ICD-562.10) / IRRITABLE BOWEL SYNDROME (ICD-564.1) & COLONIC POLYPS (ICD-211.3) ~  colonoscopy 3/06 by DrStark showing divertics & 2-63mm polyps (adenomas)... ~  colonoscopy 9/11 by DrStark showed divertics & 4mm polyp= tubular adenoma... f/u planned 75yrs.  BENIGN PROSTATIC HYPERTROPHY & ED - eval by DrTannenbaum 7/08 and Rx w/ Cialis; he has tried the PEP shots; + fam hx prostate ca in his father... ~  8/10:  DrTannenbaum did circ for phimosis, pt states that PSA check was OK... ~  3/12:  f/u BPH, ED, & PSA recheck;  Voiding satis, given Cialis & PSA= 0.47 (he had 58cc PVR- not on meds at present). ~   1/13:  Labs here showed PSA= pending (copy to DrTannenbaum)  DEGENERATIVE JOINT DISEASE (ICD-715.90) - he uses OTC meds Prn, and was rec to start Vit supplements by DrTannenbaum- takes MVI,  VitC, VitD... he has seen DrHilts in the past...  Hx of HIDRADENITIS (ICD-705.83)  ANXIETY>  He related difficulty caring for family esp elderly mother who is quite demanding & conflict w/ his daughter/ her 2 kids/ etc...  Health Maintenance - up to date on colon & prostate screens... PNEUMOVAX 9/09... TETANUS booster 3/10... gets yearly Flu vaccines in Fall.   Past Surgical History  Procedure Date  . Knee arthroscopy   . Toe amputation 06/2009    Right foot second toe -Dr Lestine Box  . Laser ablation 06/2009    Left Greater Saphenous vein -Dr Arbie Cookey  . Circumcision 06/2100    For Phimosis - Dr Marcello Fennel  . Laparotomy 07/20/11  . Colon surgery     Outpatient Encounter Prescriptions as of 11/25/2011  Medication Sig Dispense Refill  . aspirin 81 MG tablet Take 81 mg by mouth daily.        . Blood Glucose Monitoring Suppl (ONE TOUCH ULTRA SYSTEM KIT) W/DEVICE KIT Use as directed to check blood sugar       . Calcium Carbonate (CALCIUM 500 PO) Take 1 tablet by mouth daily.        . fluocinonide (LIDEX) 0.05 % cream Apply to affected area as needed       . glimepiride (AMARYL) 1 MG tablet Take 1/2 tablet by mouth every morning  30 tablet  6  . metFORMIN (GLUCOPHAGE) 500 MG tablet Take 1 tablet (500 mg total) by mouth 2 (two) times daily with a meal.  180 tablet  3  . simvastatin (ZOCOR) 40 MG tablet Take 1 tablet (40 mg total) by mouth every evening.  90 tablet  3  . vitamin C (ASCORBIC ACID) 500 MG tablet Take 500 mg by mouth daily.      Marland Kitchen DISCONTD: cholecalciferol (VITAMIN D-400) 400 UNITS TABS Take 400 Units by mouth daily.          No Known Allergies   Current Medications, Allergies, Past Medical History, Past Surgical History, Family History, and Social History were reviewed in [employer] record.    Review of Systems         See HPI - all other systems neg except as noted...  The patient denies anorexia, fever, weight loss, weight gain, vision loss, decreased hearing, hoarseness, chest pain, syncope, dyspnea on exertion, peripheral edema, prolonged cough, headaches, hemoptysis, abdominal pain, melena, hematochezia, severe indigestion/heartburn, hematuria, incontinence, muscle weakness, suspicious skin lesions, transient blindness, difficulty walking, depression, unusual weight change, abnormal bleeding, enlarged lymph nodes, and angioedema.     Objective:   Physical Exam     WD, Overweight, very large 71 y/o BM in NAD... GENERAL:  Alert & oriented; pleasant & cooperative. HEENT:  [place]/AT, EOM-wnl, PERRLA, EACs-clear, TMs-wnl, NOSE-clear, THROAT-clear & wnl. NECK:  Supple w/ fairROM; no JVD; normal carotid impulses w/o bruits; no thyromegaly or nodules palpated; no lymphadenopathy. CHEST:  Clear to P & A; without wheezes/ rales/ or rhonchi heard... HEART:  Regular Rhythm; gr 1/6 SEM without rubs or gallops detected... ABDOMEN:  Soft & nontender; normal bowel sounds; no organomegaly or masses detected. EXT: without deformities, mod arthritic changes; wearing TEDs +varicose veins/ +venous insuffic/ chr 1+ edema... NEURO:  CN's intact;  no focal neuro deficits... Derm:  Mild stasis changes in LEs w/o cellulitis etc...  RADIOLOGY DATA:  Reviewed in the EPIC EMR & discussed w/ the patient...  LABORATORY DATA:  Reviewed in the EPIC EMR & discussed w/ the patient...    >>he  is not fasting today & will ret next week for FASTING blood work...   Assessment & Plan:   Hx Sinusitis/ Hx Pneumonia>  During the 9/12 hosp for SBO he developed an ileus & pleural effusion which resolved serially...  MVP>  Mild MVP on 2DEcho & he denies CP, palpit, ch in SOB, edmea, etc...  Ven Insuffic>  He knows to elim sodium, elevate legs, wear support hose, etc...  Chol>  On  Simva40 + diet; needs ret for FLP soon...  DM>  Controlled on Metformin & Glimepiride; wt reduction helped but he put it right back on!!!  Obesity>  As noted, wt down to 310# after his hosp for SBO; then right back up to 329# after the holidays...  GI> s/p SBO w/ surg, Hx GERD/ Divertics/ IBS/ Polyps> GI per DrStark & post-op DrBlackman...  GU> BPH & ED followed by DrTannenbaum...  DJD> on OTC analgesics as needed...  ANXIETY>  We discussed coping strategies for the family stress...   Patient's Medications  New Prescriptions   No medications on file  Previous Medications   ASPIRIN 81 MG TABLET    Take 81 mg by mouth daily.     BLOOD GLUCOSE MONITORING SUPPL (ONE TOUCH ULTRA SYSTEM KIT) W/DEVICE KIT    Use as directed to check blood sugar    CALCIUM CARBONATE (CALCIUM 500 PO)    Take 1 tablet by mouth daily.     FLUOCINONIDE (LIDEX) 0.05 % CREAM    Apply to affected area as needed    GLIMEPIRIDE (AMARYL) 1 MG TABLET    Take 1/2 tablet by mouth every morning   METFORMIN (GLUCOPHAGE) 500 MG TABLET    Take 1 tablet (500 mg total) by mouth 2 (two) times daily with a meal.   SIMVASTATIN (ZOCOR) 40 MG TABLET    Take 1 tablet (40 mg total) by mouth every evening.   VITAMIN C (ASCORBIC ACID) 500 MG TABLET    Take 500 mg by mouth daily.  Modified Medications   No medications on file  Discontinued Medications   CHOLECALCIFEROL (VITAMIN D-400) 400 UNITS TABS    Take 400 Units by mouth daily.

## 2011-11-25 NOTE — Patient Instructions (Signed)
Today we updated your med list in our EPIC system...    Continue your current medications the same...  Please return to our lab one morning next week for your follow up fasting blood work...    Please call the [facility]ONE [facility]REE in a few days for your results...    Dial [id number] & when prompted enter your patient number followed by the # symbol...    Your patient number is:  [id number]#  Be sure to take some time to care for [facility]!!!  Call if I can be of assistance in any way...  Let's plan a routine follow up visit in 6 months, sooner if needed for problems.Marland KitchenMarland Kitchen

## 2011-12-02 ENCOUNTER — Other Ambulatory Visit (INDEPENDENT_AMBULATORY_CARE_PROVIDER_SITE_OTHER): Payer: Medicare Other

## 2011-12-02 DIAGNOSIS — E7800 Pure hypercholesterolemia, unspecified: Secondary | ICD-10-CM

## 2011-12-02 DIAGNOSIS — Z125 Encounter for screening for malignant neoplasm of prostate: Secondary | ICD-10-CM

## 2011-12-02 DIAGNOSIS — E119 Type 2 diabetes mellitus without complications: Secondary | ICD-10-CM

## 2011-12-02 DIAGNOSIS — Z87898 Personal history of other specified conditions: Secondary | ICD-10-CM

## 2011-12-02 LAB — CBC WITH DIFFERENTIAL/PLATELET
Basophils Relative: 0.3 % (ref 0.0–3.0)
Eosinophils Absolute: 0.2 10*3/uL (ref 0.0–0.7)
Eosinophils Relative: 5.5 % — ABNORMAL HIGH (ref 0.0–5.0)
Lymphocytes Relative: 28.3 % (ref 12.0–46.0)
MCV: 95.3 fl (ref 78.0–100.0)
Monocytes Absolute: 0.4 10*3/uL (ref 0.1–1.0)
Neutrophils Relative %: 55.6 % (ref 43.0–77.0)
Platelets: 136 10*3/uL — ABNORMAL LOW (ref 150.0–400.0)
RBC: 3.86 Mil/uL — ABNORMAL LOW (ref 4.22–5.81)
WBC: 4.1 10*3/uL — ABNORMAL LOW (ref 4.5–10.5)

## 2011-12-02 LAB — BASIC METABOLIC PANEL
BUN: 16 mg/dL (ref 6–23)
CO2: 27 mEq/L (ref 19–32)
Chloride: 102 mEq/L (ref 96–112)
Creatinine, Ser: 1.1 mg/dL (ref 0.4–1.5)
Glucose, Bld: 138 mg/dL — ABNORMAL HIGH (ref 70–99)
Potassium: 4.3 mEq/L (ref 3.5–5.1)

## 2011-12-02 LAB — LIPID PANEL
Cholesterol: 134 mg/dL (ref 0–200)
Triglycerides: 48 mg/dL (ref 0.0–149.0)

## 2011-12-02 LAB — HEPATIC FUNCTION PANEL
ALT: 30 U/L (ref 0–53)
AST: 23 U/L (ref 0–37)
Total Bilirubin: 1.2 mg/dL (ref 0.3–1.2)
Total Protein: 7.1 g/dL (ref 6.0–8.3)

## 2011-12-02 LAB — TSH: TSH: 1.16 u[IU]/mL (ref 0.35–5.50)

## 2011-12-02 LAB — PSA: PSA: 0.39 ng/mL (ref 0.10–4.00)

## 2011-12-02 LAB — HEMOGLOBIN A1C: Hgb A1c MFr Bld: 6.2 % (ref 4.6–6.5)

## 2011-12-04 LAB — URINE CULTURE: Organism ID, Bacteria: NO GROWTH

## 2012-02-28 ENCOUNTER — Ambulatory Visit: Payer: Medicare Other | Admitting: Pulmonary Disease

## 2012-03-14 ENCOUNTER — Other Ambulatory Visit: Payer: Self-pay | Admitting: Pulmonary Disease

## 2012-03-14 MED ORDER — SIMVASTATIN 40 MG PO TABS: 40.0000 mg | ORAL_TABLET | Freq: Every evening | ORAL | Status: DC

## 2012-03-14 MED ORDER — METFORMIN HCL 500 MG PO TABS: 500.0000 mg | ORAL_TABLET | Freq: Two times a day (BID) | ORAL | Status: DC

## 2012-03-14 MED ORDER — GLIMEPIRIDE 1 MG PO TABS: ORAL_TABLET | ORAL | Status: DC

## 2012-04-07 ENCOUNTER — Telehealth: Payer: Self-pay | Admitting: Pulmonary Disease

## 2012-04-11 NOTE — Telephone Encounter (Signed)
Form has been placed on SN cart for date to be fixed and will fax back once this is completed.

## 2012-04-11 NOTE — Telephone Encounter (Signed)
Please advise status of form thanks 

## 2012-04-12 NOTE — Telephone Encounter (Signed)
Form has been corrected by SN and faxed back today.  Placed in the scan folder.

## 2012-05-22 ENCOUNTER — Telehealth: Payer: Self-pay | Admitting: Pulmonary Disease

## 2012-05-23 NOTE — Telephone Encounter (Signed)
Document has been signed by SN and the form along with last ov note has been faxed to [phone] per pts request.  Unable to leave a message on the pts VM.  Will try back later.

## 2012-05-23 NOTE — Telephone Encounter (Signed)
I spoke with pt and is aware of this. Nothing further was needed 

## 2012-05-25 ENCOUNTER — Ambulatory Visit (INDEPENDENT_AMBULATORY_CARE_PROVIDER_SITE_OTHER): Payer: Medicare Other | Admitting: Pulmonary Disease

## 2012-05-25 ENCOUNTER — Other Ambulatory Visit (INDEPENDENT_AMBULATORY_CARE_PROVIDER_SITE_OTHER): Payer: Medicare Other

## 2012-05-25 ENCOUNTER — Encounter: Payer: Self-pay | Admitting: Pulmonary Disease

## 2012-05-25 VITALS — BP 118/64 | HR 74 | Temp 97.8°F | Ht >= 80 in | Wt 323.2 lb

## 2012-05-25 DIAGNOSIS — K5730 Diverticulosis of large intestine without perforation or abscess without bleeding: Secondary | ICD-10-CM

## 2012-05-25 DIAGNOSIS — E119 Type 2 diabetes mellitus without complications: Secondary | ICD-10-CM

## 2012-05-25 DIAGNOSIS — E7800 Pure hypercholesterolemia, unspecified: Secondary | ICD-10-CM

## 2012-05-25 DIAGNOSIS — M1990 Unspecified osteoarthritis, unspecified site: Secondary | ICD-10-CM

## 2012-05-25 DIAGNOSIS — I059 Rheumatic mitral valve disease, unspecified: Secondary | ICD-10-CM

## 2012-05-25 DIAGNOSIS — Z87898 Personal history of other specified conditions: Secondary | ICD-10-CM

## 2012-05-25 DIAGNOSIS — E669 Obesity, unspecified: Secondary | ICD-10-CM

## 2012-05-25 DIAGNOSIS — K219 Gastro-esophageal reflux disease without esophagitis: Secondary | ICD-10-CM

## 2012-05-25 DIAGNOSIS — I872 Venous insufficiency (chronic) (peripheral): Secondary | ICD-10-CM

## 2012-05-25 LAB — BASIC METABOLIC PANEL
Calcium: 9.1 mg/dL (ref 8.4–10.5)
Creatinine, Ser: 1 mg/dL (ref 0.4–1.5)
GFR: 96.06 mL/min (ref >60.00)
Glucose, Bld: 116 mg/dL — ABNORMAL HIGH (ref 70–99)
Sodium: 138 mEq/L (ref 135–145)

## 2012-05-25 MED ORDER — CLOTRIMAZOLE-BETAMETHASONE 1-0.05 % EX CREA: TOPICAL_CREAM | Freq: Two times a day (BID) | CUTANEOUS | Status: DC

## 2012-05-25 NOTE — Progress Notes (Signed)
Subjective:    Patient ID: Jason [name], male    DOB: [birth year], 71 y.o.   MRN: [id number]  HPI 71 y/o BM here for a follow up visit... 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     ~  August 30, 2011:  40mo ROV & post HOSP visit> Adm 9/12 for SBO & required ELap w/ lysis of adhesions by DrBlackman, CCS; post-op problems included ileus, cough, pleural effusion, & minor wound complic managed as outpt & now back to baseline...  ~  November 25, 2011:  28mo ROV & he reports stable, just ate too much over the holidays and his wt is up 19# to 329# today; we reviewed diet, exercise & wt reduction strategies "I'm going to work on it"... He is not fasting today & will ret next week for [name] fasting labs>>    MVP>  On ASA daily & 2DEcho 10/11 showed mild LVH, norm LVF w/ EF= 55-60%, ant leaflet MVP seen w/ MR, mod LAdil; he denies CP, palpit, SOB, edema...    Ven Insuffic>  Hx VV, VI, stasis changes w/ ulcer/ cellulitis in past; s/p laser to left GSV by DrEarly & s/p right 2nd toe amp for osteomyelitis by Volney Presser 2010.    Chol>  On Simva40 (taking 1/2 of the 80) and last FLP 3/12 showed TChol 141, TG 55, HDL 50, LDL 81... keep same dose + better diet & get wt down!    DM>  On Metformin 500Bid & Glimep 1mg - 1/2Qam; BS at home are sl up w/ wt gain per pt; f/u BS/ A1c ==> pending.    Obesity>  weight is back up 19# to 329# today; we reviewed the critical need for wt reducting diet + exercise program...    GI>  Adm 9/12 w/ SBO & had ELap w/ lysis of adhesions;  Now improved & back to  baseline w/o abd pain, N/V/D/C/ or blood seen...    GU>  He saw DrTannenbaum et al 3/12 for f/u BPH, ED, & PSA recheck;  Voiding satis, given Cialis & PSA= 0.47 (he had 58cc PVR- not on meds at present)...    DJD/ LBP>  C/o LBP w/ walking, no radiation;  He declines further eval at this point, prefers exerc program, heat, aleve Prn...    Anxiety>  He related difficulty caring for family esp elderly mother who is quite demanding & conflict w/ his daughter/ her 2 kids/ etc...  ~  May 25, 2012:  40mo ROV & Lamichael has had a time w/ his right foot- great toe sore (ulcer under the big toe per pt) treated by DrTuckman, Podiatry but we don't have any notes from them & we will call for records (recall hx prev right 2nd toe amputation 2010 by Volney Presser for osteomyelitis);  His stress level is somewhat diminished w/ kids moved out & some quiet returned;  No new complaints per pt...    We  reviewed prob list, meds, xrays and labs> see below for updates>> LABS 7/13:  [place]- ok x BS=116 A1c=7.1          Problem List:  Hx of SINUSITIS (ICD-473.9)  Hx Pneumonia - seen in ER 7/09 w/ faint RLL infiltrate that cleared serially w/ antibiotic Rx...  MITRAL VALVE PROLAPSE (ICD-424.0) - on ASA 81mg /d... he denies CP, palpit, dyspnea... exam w/ MR murmur... ~  2DEcho 5/01 showed mild ant leaflet MVP, mild MR, norm LVF. ~  f/u 2DEcho 10/11 showed mild LVH, norm LVF w/ EF= 55-60%, ant leaflet MVP seen w/ MR, mod LAdil ~  EKG 9/12 showed NSR, rate80, NSSTTWA ~  7/13:  BP= 118/64 on diet Rx alone & reminded to elim sodium, get wt down...  VENOUS INSUFFICIENCY (ICD-459.81) - he knows to elim salt, elevate legs, wear TED's... ~  neg ven dopplers in 1996...  ~  he saw DrLawson in 2000 for a ven stasis ulcer on his leg... rec for TED's, no salt, elevation, etc...  ~  he had normal ABI's in 10/06... ~  3/10:  left leg wound w/ cellulitis & +MRSA... Rx'd and refer back to wound care clinic. ~  7/10:  sched for right  second toe amputation due to osteomyelitis despite hyperbaric O2 etc... ~  8/10: s/p right second toe amp for osteomyelitis Lestine Box), and s/p laser ablation of left greater saphenous vein (DrEarly). ~  7/13:  He tells me that DrTuckman, Podiatrist has been treating his right great toe (ulcer on plantar surface)- we will call for notes.  HYPERCHOLESTEROLEMIA (ICD-272.0) - changed to SIMVASTATIN 80mg - 1/2 tab Qhs... ~  FLP 9/08 showed TChol 145, TG 54, HDL 46, LDL 88 ~  FLP 9/09 showed TChol 144, Tg 65, HDL 48, LDL 83 ~  FLP 9/10 showed TChol 146, TG 52, HDL 51, LDL 85 ~  FLP 3/12 showed TChol 141, TG 55, HDL 50, LDL 81... keep same dose + better diet & get wt down! ~  FLP 1/13 on Simva40 showed TChol 134, TG 48, HDL 48, LDL 77... Continue same.  DIABETES MELLITUS (ICD-250.00) - on METFORMIN 500mg  Bid + GLIMEPIRIDE 1mg - 1/2 tab, and diet efforts... ~  labs 9/08 showed BS=123, HgA1c=6.0.Marland Kitchen. urine microalb was neg... ~  labs 9/09 showed BS= 144, HgA1c= 6.1.Marland Kitchen. rec> same med, better diet. ~  labs 3/10 showed BS= 147, HgA1c= 6.9.Marland Kitchen. rec> incr the MetformBid, but he never did. ~  7/10: he states the wound care clinic increased his glucose intake due to Hyperbaric O2 Rx... we increased his Metform500 Bid. ~  labs 9/10 on Metform500Bid showed BS= 154, A1c= 6.4 ~  labs 3/11 on Metform500Bid showed BS= 192, A1c= 7.5.Marland Kitchen. rec> add Glimep 1mg /d. ~  labs 9/11 on Metform500Bid+Glim1 showed BS= 124, A1c= 5.8.Marland Kitchen. rec> decr Glimep1mg  to 1/2 tab daily (he never did). ~  Labs 3/12 on Metform500Bid+Glim1 showed BS= 114, A1c= 5.9.Marland KitchenMarland Kitchen Rec> decr Glim1mg - to 1/2 tab. ~  Labs in [facility] 9/12 SBO showed BS betw 78 - 164 ~  Labs 1/13 on Metform500Bid+Glim0.5 showed BS= 138, A1c= 6.2.Marland KitchenMarland Kitchen Rec> continue same, get wt down. ~  Labs 7/13 on Metform500Bid+Glim0.5 showed BS= 116, A1c= 7.1.Marland KitchenMarland Kitchen Not as good, same meds, better diet.  OBESITY (ICD-278.00) - he is very large at 6\' 8"  tall and >300# for the last 10+ yrs... peak = 340#... he  drinks beer daily... discussed diet + exercise program (and no beer)... ~  he had transient right gynecomastia 9/09, no discharge, neg mammogram &  resolved on it's own... ~  weight 3/11 = 333# ~  weight 9/11 = 319# ~  Weight 3/12 = 327# ~  Weight 10/12 = 310#  post hosp for SBO... ~  Weight 1/13 = 329# ~  Weight 7/13 = 323#  GERD (ICD-530.81)  DIVERTICULOSIS OF COLON (ICD-562.10) / IRRITABLE BOWEL SYNDROME (ICD-564.1) & COLONIC POLYPS (ICD-211.3) ~  colonoscopy 3/06 by DrStark showing divertics & 2-39mm polyps (adenomas)... ~  colonoscopy 9/11 by DrStark showed divertics & 4mm polyp= tubular adenoma... f/u planned 65yrs.  BENIGN PROSTATIC HYPERTROPHY & ED - eval by DrTannenbaum 7/08 and Rx w/ Cialis; he has tried the PEP shots; + fam hx prostate ca in his father... ~  8/10:  DrTannenbaum did circ for phimosis, pt states that PSA check was OK... ~  3/12:  f/u BPH, ED, & PSA recheck;  Voiding satis, given Cialis & PSA= 0.47 (he had 58cc PVR- not on meds at present). ~  1/13:  Labs here showed PSA= 0.39 (copy to DrTannenbaum)  DEGENERATIVE JOINT DISEASE (ICD-715.90) - he uses OTC meds Prn, and was rec to start Vit supplements by DrTannenbaum- takes MVI, VitC, VitD... he has seen DrHilts in the past...  Hx of HIDRADENITIS (ICD-705.83)  ANXIETY>  He related difficulty caring for family esp elderly mother who is quite demanding & conflict w/ his daughter/ her 2 kids/ etc...  Health Maintenance - up to date on colon & prostate screens... PNEUMOVAX 9/09... TETANUS booster 3/10... gets yearly Flu vaccines in Fall.   Past Surgical History  Procedure Date  . Knee arthroscopy   . Toe amputation 06/2009    Right foot second toe -Dr Lestine Box  . Laser ablation 06/2009    Left Greater Saphenous vein -Dr Arbie Cookey  . Circumcision 06/2100    For Phimosis - Dr Marcello Fennel  . Laparotomy 07/20/11  . Colon surgery     Outpatient Encounter Prescriptions as of 05/25/2012  Medication Sig Dispense Refill  .  aspirin 81 MG tablet Take 81 mg by mouth daily.        . Blood Glucose Monitoring Suppl (ONE TOUCH ULTRA SYSTEM KIT) W/DEVICE KIT Use as directed to check blood sugar       . fluocinonide (LIDEX) 0.05 % cream Apply to affected area as needed       . glimepiride (AMARYL) 1 MG tablet Take 1/2 tablet by mouth every morning  45 tablet  3  . metFORMIN (GLUCOPHAGE) 500 MG tablet Take 1 tablet (500 mg total) by mouth 2 (two) times daily with a meal.  180 tablet  3  . simvastatin (ZOCOR) 40 MG tablet Take 1 tablet (40 mg total) by mouth every evening.  90 tablet  3  . vitamin C (ASCORBIC ACID) 500 MG tablet Take 500 mg by mouth daily.      Marland Kitchen DISCONTD: Calcium Carbonate (CALCIUM 500 PO) Take 1 tablet by mouth daily.          No Known Allergies   Current Medications, Allergies, Past Medical History, Past Surgical History, Family History, and Social History were reviewed in [employer] record.    Review of Systems         See HPI - all other systems neg except as noted...  The patient denies anorexia, fever, weight loss, weight gain, vision loss, decreased hearing, hoarseness, chest pain, syncope, dyspnea on exertion, peripheral edema, prolonged cough, headaches, hemoptysis, abdominal pain, melena, hematochezia, severe indigestion/heartburn, hematuria, incontinence, muscle weakness, suspicious skin lesions, transient blindness, difficulty  walking, depression, unusual weight change, abnormal bleeding, enlarged lymph nodes, and angioedema.     Objective:   Physical Exam     WD, Overweight, very large 71 y/o BM in NAD... GENERAL:  Alert & oriented; pleasant & cooperative. HEENT:  [place]/AT, EOM-wnl, PERRLA, EACs-clear, TMs-wnl, NOSE-clear, THROAT-clear & wnl. NECK:  Supple w/ fairROM; no JVD; normal carotid impulses w/o bruits; no thyromegaly or nodules palpated; no lymphadenopathy. CHEST:  Clear to P & A; without wheezes/ rales/ or rhonchi heard... HEART:  Regular Rhythm; gr 1/6  SEM without rubs or gallops detected... ABDOMEN:  Soft & nontender; normal bowel sounds; no organomegaly or masses detected. EXT: without deformities, mod arthritic changes; wearing TEDs +varicose veins/ +venous insuffic/ chr 1+ edema... NEURO:  CN's intact;  no focal neuro deficits... Derm:  Mild stasis changes in LEs w/o cellulitis etc...  RADIOLOGY DATA:  Reviewed in the EPIC EMR & discussed w/ the patient...  LABORATORY DATA:  Reviewed in the EPIC EMR & discussed w/ the patient...   Assessment & Plan:   Hx Sinusitis/ Hx Pneumonia>  During the 9/12 hosp for SBO he developed an ileus & pleural effusion which resolved serially...  MVP>  Mild MVP on 2DEcho & he denies CP, palpit, ch in SOB, edmea, etc...  Ven Insuffic>  He had vein surg from DrEarly; knows to elim sodium, elevate legs, wear support hose, etc...  Chol>  On Simva40 + diet;  FLP looks good...  DM>  Controlled on Metformin & Glimepiride; he needs better diet, incr exercise, get wt down...    Diabetic foot ulcer> he lost right 2nd toe to osteomyelitis in 2010; now w/ ulcer on plantar aspect great toe> under the care of Podiatry...  Obesity>  As noted, wt down to 310# after his hosp for SBO; then right back up to 329# after the holidays...  GI> s/p SBO w/ surg, Hx GERD/ Divertics/ IBS/ Polyps> GI per DrStark & post-op DrBlackman...  GU> BPH & ED followed by DrTannenbaum...  DJD> on OTC analgesics as needed...  ANXIETY>  We discussed coping strategies for the family stress...   Patient's Medications  New Prescriptions   CLOTRIMAZOLE-BETAMETHASONE (LOTRISONE) CREAM    Apply topically 2 (two) times daily. To rash  Previous Medications   ASPIRIN 81 MG TABLET    Take 81 mg by mouth daily.     BLOOD GLUCOSE MONITORING SUPPL (ONE TOUCH ULTRA SYSTEM KIT) W/DEVICE KIT    Use as directed to check blood sugar    FLUOCINONIDE (LIDEX) 0.05 % CREAM    Apply to affected area as needed    GLIMEPIRIDE (AMARYL) 1 MG TABLET    Take  1/2 tablet by mouth every morning   METFORMIN (GLUCOPHAGE) 500 MG TABLET    Take 1 tablet (500 mg total) by mouth 2 (two) times daily with a meal.   SIMVASTATIN (ZOCOR) 40 MG TABLET    Take 1 tablet (40 mg total) by mouth every evening.   VITAMIN C (ASCORBIC ACID) 500 MG TABLET    Take 500 mg by mouth daily.  Modified Medications   No medications on file  Discontinued Medications   CALCIUM CARBONATE (CALCIUM 500 PO)    Take 1 tablet by mouth daily.

## 2012-05-25 NOTE — Patient Instructions (Addendum)
Today we updated your med list in our EPIC system...    Continue your current medications the same...  Today we rechecked your Diabetic labs...    We will call you w/ the results when avail...  Let's get on track w/ our low carb, no sweets diet & work on weight reduction...  Call for any questions...  Let's plan a follow up visit in 6 months.Marland KitchenMarland Kitchen

## 2012-05-26 ENCOUNTER — Telehealth: Payer: Self-pay | Admitting: Pulmonary Disease

## 2012-05-26 NOTE — Telephone Encounter (Signed)
Please notify patient> DM labs showed BS=116, A1c=7.1> not as good as previous... Rec> same meds & better diet, get wt down...  ATC # listed was not able to leave VM bc it was full WCB.

## 2012-05-26 NOTE — Telephone Encounter (Signed)
Jason [name] has already spoken with spouse

## 2012-05-29 ENCOUNTER — Telehealth: Payer: Self-pay | Admitting: Pulmonary Disease

## 2012-05-29 NOTE — Telephone Encounter (Signed)
Attempted to call the pt and unable to reach the pt.  Not able to leave a message.  Will try back later.

## 2012-05-30 NOTE — Telephone Encounter (Signed)
ATC pt. Message stated mailbox is full. WCB. Carron Curie, CMA

## 2012-05-30 NOTE — Telephone Encounter (Signed)
Returning call.

## 2012-05-30 NOTE — Telephone Encounter (Signed)
Pt states [employer] was going to fax back the form that had previously been filled out by SN. He states the incorrect date was listed on the form. Leigh, have you seen this form?

## 2012-05-31 NOTE — Telephone Encounter (Signed)
i have not seen any papers that have been sent back on this patient.  thanks

## 2012-05-31 NOTE — Telephone Encounter (Signed)
Pt aware that we have not seen additional forms from [employer] per Leigh. The pt states that this was initaited by [facility] and he will contact them to see if they have a phone number for [employer]. Pt states he will call [employer] himself to find out what is needed and ask that they either call our office or will call us back with the number.   Per Leigh, the original form was sent for scanning but has not made it into the chart.

## 2012-06-05 ENCOUNTER — Telehealth: Payer: Self-pay | Admitting: Pulmonary Disease

## 2012-06-05 NOTE — Telephone Encounter (Signed)
SN has this paperwork on his cart to fill out. i spoke with pt and is aware of this. Will print off message for SN to go with paperwork so he is aware of this.

## 2012-06-06 NOTE — Telephone Encounter (Signed)
Paperwork faxed and pt is aware. Carron Curie, CMA

## 2012-06-21 ENCOUNTER — Telehealth: Payer: Self-pay | Admitting: Pulmonary Disease

## 2012-06-21 DIAGNOSIS — E119 Type 2 diabetes mellitus without complications: Secondary | ICD-10-CM

## 2012-06-21 NOTE — Telephone Encounter (Signed)
Pt came into the office today and stated that he was seen by his foot doctor today and was told that he had a low grade temp.  i spoke with the pt and no temp at this time.  He stated that the doctor will not send him to the wound clinic to help heal up his last surgery where he had a toe removed.  He stated that they told him today that this area is draining but is clear and does not seem to be infected.  Pt stated that his doctor is wanting to clear this up but informed the pt today that he may end up having to remove another toe.  Pt is refusing this to be done and is now requesting a 2nd opinion to be set up by SN.  Per SN---ok to send in referral for the pt to see Dr. Lajoyce Corners for 2nd opinion on his foot.  This order has been placed and pt is aware that we will contact him with this appt.

## 2012-07-25 ENCOUNTER — Telehealth: Payer: Self-pay | Admitting: Pulmonary Disease

## 2012-07-25 NOTE — Telephone Encounter (Signed)
Per SN---increase glimepiride from 1 mg to  2 mg by mouth every morning, continue the metformin 500 mg  Bid and stay away from sugars and sweets and increase water intake.  thanks

## 2012-07-25 NOTE — Telephone Encounter (Signed)
lmomtcb  

## 2012-07-25 NOTE — Telephone Encounter (Signed)
Spoke with pt. He states having elevated BS the past 2 days- yesterday am FBS was 203 and it was 315 this am. States that he went out of town over the w/e and ate some things he knows he should not have. He also had been being treated at the wound ctr for sore on his toe, it's now oozing pus and he has called Dr Lajoyce Corners for recs regarding this. Still awaiting his call back. Wants SN's recs in the meantime. Currently taking metformin 500 bid and glimipiride 1 mg daily. Please advise thanks! No Known Allergies

## 2012-07-26 MED ORDER — GLIMEPIRIDE 2 MG PO TABS: 2.0000 mg | ORAL_TABLET | Freq: Every day | ORAL | Status: DC

## 2012-07-26 NOTE — Telephone Encounter (Signed)
Spoke with pt and notified of recs per SN. Pt verbalized understanding and states nothing further needed.  

## 2012-08-02 ENCOUNTER — Other Ambulatory Visit (HOSPITAL_COMMUNITY): Payer: Self-pay | Admitting: Orthopedic Surgery

## 2012-08-04 ENCOUNTER — Encounter (HOSPITAL_COMMUNITY): Payer: Self-pay | Admitting: Pharmacy Technician

## 2012-08-07 ENCOUNTER — Encounter (HOSPITAL_COMMUNITY)
Admission: RE | Admit: 2012-08-07 | Discharge: 2012-08-07 | Disposition: A | Discharge status: Home / Self Care | Payer: Medicare Other | Source: Ambulatory Visit | Attending: Orthopedic Surgery | Admitting: Orthopedic Surgery

## 2012-08-07 ENCOUNTER — Ambulatory Visit (HOSPITAL_COMMUNITY)
Admission: RE | Admit: 2012-08-07 | Discharge: 2012-08-07 | Disposition: A | Discharge status: Home / Self Care | Payer: Medicare Other | Source: Ambulatory Visit | Attending: Orthopedic Surgery | Admitting: Orthopedic Surgery

## 2012-08-07 ENCOUNTER — Encounter (HOSPITAL_COMMUNITY): Payer: Self-pay

## 2012-08-07 DIAGNOSIS — Z01818 Encounter for other preprocedural examination: Secondary | ICD-10-CM | POA: Insufficient documentation

## 2012-08-07 DIAGNOSIS — Z01812 Encounter for preprocedural laboratory examination: Secondary | ICD-10-CM | POA: Insufficient documentation

## 2012-08-07 DIAGNOSIS — Z01810 Encounter for preprocedural cardiovascular examination: Secondary | ICD-10-CM | POA: Insufficient documentation

## 2012-08-07 HISTORY — DX: Sleep apnea, unspecified: G47.30

## 2012-08-07 LAB — CBC
Platelets: 201 10*3/uL (ref 150–400)
RBC: 3.81 MIL/uL — ABNORMAL LOW (ref 4.22–5.81)
RDW: 12.3 % (ref 11.5–15.5)
WBC: 6.3 10*3/uL (ref 4.0–10.5)

## 2012-08-07 LAB — COMPREHENSIVE METABOLIC PANEL
ALT: 18 U/L (ref 0–53)
AST: 19 U/L (ref 0–37)
Albumin: 3.6 g/dL (ref 3.5–5.2)
CO2: 27 mEq/L (ref 19–32)
Chloride: 104 mEq/L (ref 96–112)
Creatinine, Ser: 0.93 mg/dL (ref 0.50–1.35)
GFR calc non Af Amer: 83 mL/min — ABNORMAL LOW (ref >90)
Sodium: 140 mEq/L (ref 135–145)
Total Bilirubin: 0.4 mg/dL (ref 0.3–1.2)

## 2012-08-07 LAB — APTT: aPTT: 35 seconds (ref 24–37)

## 2012-08-07 LAB — SURGICAL PCR SCREEN: MRSA, PCR: NEGATIVE

## 2012-08-07 LAB — PROTIME-INR: INR: 1.11 (ref 0.00–1.49)

## 2012-08-07 MED ORDER — DEXTROSE 5 % IV SOLN: 3.0000 g | INTRAVENOUS | Status: DC

## 2012-08-07 NOTE — Pre-Procedure Instructions (Signed)
20 MAREK NGHIEM  08/07/2012   Your procedure is scheduled on:  08/09/2012  Report to [facility] at 1000 AM.  Call this number if you have problems the morning of surgery: [phone]   Remember:   Do not eat food DO NOT DRINK :After Midnight.    Take these medicines the morning of surgery with A SIP OF WATER: VIBRA TABS     Do not wear jewelry, make-up or nail polish.  Do not wear lotions, powders, or perfumes. You may wear deodorant.  Do not shave 48 hours prior to surgery. Men may shave face and neck.  Do not bring valuables to the hospital.  Contacts, dentures or bridgework may not be worn into surgery.  Leave suitcase in the car. After surgery it may be brought to your room.  For patients admitted to the hospital, checkout time is 11:00 AM the day of discharge.   Patients discharged the day of surgery will not be allowed to drive home.  Name and phone number of your driver: SON MARCUS  Special Instructions: Shower using CHG 2 nights before surgery and the night before surgery.  If you shower the day of surgery use CHG.  Use special wash - you have one bottle of CHG for all showers.  You should use approximately 1/3 of the bottle for each shower.   Please read over the following fact sheets that you were given: Pain Booklet, Coughing and Deep Breathing, Lab Information, MRSA Information and Surgical Site Infection Prevention

## 2012-08-09 ENCOUNTER — Ambulatory Visit (HOSPITAL_COMMUNITY): Payer: Medicare Other | Admitting: Anesthesiology

## 2012-08-09 ENCOUNTER — Ambulatory Visit (HOSPITAL_COMMUNITY)
Admission: RE | Admit: 2012-08-09 | Discharge: 2012-08-09 | Disposition: A | Discharge status: Home / Self Care | Payer: Medicare Other | Source: Ambulatory Visit | Attending: Orthopedic Surgery | Admitting: Orthopedic Surgery

## 2012-08-09 ENCOUNTER — Encounter (HOSPITAL_COMMUNITY)
Admission: RE | Disposition: A | Discharge status: Home / Self Care | Payer: Self-pay | Source: Ambulatory Visit | Attending: Orthopedic Surgery

## 2012-08-09 ENCOUNTER — Encounter (HOSPITAL_COMMUNITY): Payer: Self-pay | Admitting: *Deleted

## 2012-08-09 ENCOUNTER — Encounter (HOSPITAL_COMMUNITY): Payer: Self-pay | Admitting: Anesthesiology

## 2012-08-09 DIAGNOSIS — M9080 Osteopathy in diseases classified elsewhere, unspecified site: Secondary | ICD-10-CM | POA: Insufficient documentation

## 2012-08-09 DIAGNOSIS — E119 Type 2 diabetes mellitus without complications: Secondary | ICD-10-CM

## 2012-08-09 DIAGNOSIS — E1169 Type 2 diabetes mellitus with other specified complication: Secondary | ICD-10-CM | POA: Insufficient documentation

## 2012-08-09 DIAGNOSIS — K219 Gastro-esophageal reflux disease without esophagitis: Secondary | ICD-10-CM | POA: Insufficient documentation

## 2012-08-09 DIAGNOSIS — E669 Obesity, unspecified: Secondary | ICD-10-CM | POA: Insufficient documentation

## 2012-08-09 DIAGNOSIS — M869 Osteomyelitis, unspecified: Secondary | ICD-10-CM | POA: Insufficient documentation

## 2012-08-09 DIAGNOSIS — M86179 Other acute osteomyelitis, unspecified ankle and foot: Secondary | ICD-10-CM

## 2012-08-09 DIAGNOSIS — G4730 Sleep apnea, unspecified: Secondary | ICD-10-CM | POA: Insufficient documentation

## 2012-08-09 HISTORY — PX: AMPUTATION: SHX166

## 2012-08-09 LAB — GLUCOSE, CAPILLARY
Glucose-Capillary: 118 mg/dL — ABNORMAL HIGH (ref 70–99)
Glucose-Capillary: 88 mg/dL (ref 70–99)

## 2012-08-09 SURGERY — AMPUTATION DIGIT
Anesthesia: General | Site: Foot | Laterality: Right | Wound class: Dirty or Infected

## 2012-08-09 MED ORDER — OXYCODONE HCL 5 MG PO TABS
5.0000 mg | ORAL_TABLET | Freq: Once | ORAL | Status: DC | PRN
Start: 2012-08-09 — End: 2012-08-09

## 2012-08-09 MED ORDER — HYDROCODONE-ACETAMINOPHEN 5-500 MG PO TABS: 1.0000 | ORAL_TABLET | Freq: Four times a day (QID) | ORAL | Status: DC | PRN

## 2012-08-09 MED ORDER — MIDAZOLAM HCL 5 MG/5ML IJ SOLN
INTRAMUSCULAR | Status: DC | PRN
  Administered 2012-08-09: 2 mg via INTRAVENOUS

## 2012-08-09 MED ORDER — DEXTROSE 5 % IV SOLN
3.0000 g | Freq: Once | INTRAVENOUS | Status: AC
  Administered 2012-08-09: 2 g via INTRAVENOUS
  Filled 2012-08-09 (×2): qty 3000

## 2012-08-09 MED ORDER — LACTATED RINGERS IV SOLN
INTRAVENOUS | Status: DC | PRN
  Administered 2012-08-09: 12:00:00 via INTRAVENOUS

## 2012-08-09 MED ORDER — ONDANSETRON HCL 4 MG/2ML IJ SOLN
INTRAMUSCULAR | Status: DC | PRN
  Administered 2012-08-09: 4 mg via INTRAVENOUS

## 2012-08-09 MED ORDER — 0.9 % SODIUM CHLORIDE (POUR BTL) OPTIME
TOPICAL | Status: DC | PRN
  Administered 2012-08-09: 1000 mL

## 2012-08-09 MED ORDER — CEFAZOLIN SODIUM-DEXTROSE 2-3 GM-% IV SOLR
INTRAVENOUS | Status: AC
  Filled 2012-08-09: qty 50

## 2012-08-09 MED ORDER — OXYCODONE HCL 5 MG/5ML PO SOLN: 5.0000 mg | Freq: Once | ORAL | Status: DC | PRN

## 2012-08-09 MED ORDER — HYDROMORPHONE HCL PF 1 MG/ML IJ SOLN: 0.2500 mg | INTRAMUSCULAR | Status: DC | PRN

## 2012-08-09 MED ORDER — FENTANYL CITRATE 0.05 MG/ML IJ SOLN
INTRAMUSCULAR | Status: DC | PRN
  Administered 2012-08-09: 150 ug via INTRAVENOUS

## 2012-08-09 MED ORDER — SUCCINYLCHOLINE CHLORIDE 20 MG/ML IJ SOLN
INTRAMUSCULAR | Status: DC | PRN
  Administered 2012-08-09: 140 mg via INTRAVENOUS

## 2012-08-09 MED ORDER — PROPOFOL 10 MG/ML IV BOLUS
INTRAVENOUS | Status: DC | PRN
  Administered 2012-08-09: 200 mg via INTRAVENOUS

## 2012-08-09 MED ORDER — MEPERIDINE HCL 25 MG/ML IJ SOLN: 6.2500 mg | INTRAMUSCULAR | Status: DC | PRN

## 2012-08-09 MED ORDER — ONDANSETRON HCL 4 MG/2ML IJ SOLN: 4.0000 mg | Freq: Once | INTRAMUSCULAR | Status: DC | PRN

## 2012-08-09 SURGICAL SUPPLY — 49 items
BANDAGE GAUZE 4  KLING STR (GAUZE/BANDAGES/DRESSINGS) IMPLANT
BANDAGE GAUZE ELAST BULKY 4 IN (GAUZE/BANDAGES/DRESSINGS) ×2 IMPLANT
BLADE AVERAGE 25X9 (BLADE) IMPLANT
BLADE MINI RND TIP GREEN BEAV (BLADE) IMPLANT
BNDG COHESIVE 1X5 TAN STRL LF (GAUZE/BANDAGES/DRESSINGS) IMPLANT
BNDG COHESIVE 6X5 TAN STRL LF (GAUZE/BANDAGES/DRESSINGS) ×4 IMPLANT
BNDG ESMARK 4X9 LF (GAUZE/BANDAGES/DRESSINGS) ×2 IMPLANT
BNDG GAUZE STRTCH 6 (GAUZE/BANDAGES/DRESSINGS) ×2 IMPLANT
CLOTH BEACON ORANGE TIMEOUT ST (SAFETY) ×2 IMPLANT
CORDS BIPOLAR (ELECTRODE) ×2 IMPLANT
COVER SURGICAL LIGHT HANDLE (MISCELLANEOUS) ×2 IMPLANT
CUFF TOURNIQUET SINGLE 18IN (TOURNIQUET CUFF) IMPLANT
CUFF TOURNIQUET SINGLE 24IN (TOURNIQUET CUFF) IMPLANT
CUFF TOURNIQUET SINGLE 34IN LL (TOURNIQUET CUFF) IMPLANT
CUFF TOURNIQUET SINGLE 44IN (TOURNIQUET CUFF) IMPLANT
DRAPE U-SHAPE 47X51 STRL (DRAPES) ×2 IMPLANT
DRSG ADAPTIC 3X8 NADH LF (GAUZE/BANDAGES/DRESSINGS) ×4 IMPLANT
DRSG PAD ABDOMINAL 8X10 ST (GAUZE/BANDAGES/DRESSINGS) ×2 IMPLANT
DURAPREP 26ML APPLICATOR (WOUND CARE) ×2 IMPLANT
ELECT REM PT RETURN 9FT ADLT (ELECTROSURGICAL) ×2
ELECTRODE REM PT RTRN 9FT ADLT (ELECTROSURGICAL) ×1 IMPLANT
GAUZE SPONGE 2X2 8PLY STRL LF (GAUZE/BANDAGES/DRESSINGS) IMPLANT
GLOVE BIOGEL PI IND STRL 9 (GLOVE) ×1 IMPLANT
GLOVE BIOGEL PI INDICATOR 9 (GLOVE) ×1
GLOVE SURG ORTHO 9.0 STRL STRW (GLOVE) ×2 IMPLANT
GLOVE SURG SS PI 7.5 STRL IVOR (GLOVE) ×2 IMPLANT
GOWN PREVENTION PLUS XLARGE (GOWN DISPOSABLE) ×2 IMPLANT
GOWN SRG XL XLNG 56XLVL 4 (GOWN DISPOSABLE) ×1 IMPLANT
GOWN STRL NON-REIN XL XLG LVL4 (GOWN DISPOSABLE) ×1
KIT BASIN OR (CUSTOM PROCEDURE TRAY) ×2 IMPLANT
KIT ROOM TURNOVER OR (KITS) ×2 IMPLANT
MANIFOLD NEPTUNE II (INSTRUMENTS) ×2 IMPLANT
NEEDLE HYPO 25GX1X1/2 BEV (NEEDLE) IMPLANT
NS IRRIG 1000ML POUR BTL (IV SOLUTION) ×2 IMPLANT
PACK ORTHO EXTREMITY (CUSTOM PROCEDURE TRAY) ×2 IMPLANT
PAD ARMBOARD 7.5X6 YLW CONV (MISCELLANEOUS) ×4 IMPLANT
PAD CAST 4YDX4 CTTN HI CHSV (CAST SUPPLIES) ×1 IMPLANT
PADDING CAST COTTON 4X4 STRL (CAST SUPPLIES) ×1
SPECIMEN JAR SMALL (MISCELLANEOUS) ×2 IMPLANT
SPONGE GAUZE 2X2 STER 10/PKG (GAUZE/BANDAGES/DRESSINGS)
SPONGE GAUZE 4X4 12PLY (GAUZE/BANDAGES/DRESSINGS) ×4 IMPLANT
SUCTION FRAZIER TIP 10 FR DISP (SUCTIONS) ×2 IMPLANT
SUT ETHILON 2 0 PSLX (SUTURE) ×2 IMPLANT
SUT VIC AB 2-0 FS1 27 (SUTURE) ×2 IMPLANT
SYR CONTROL 10ML LL (SYRINGE) IMPLANT
TOWEL OR 17X24 6PK STRL BLUE (TOWEL DISPOSABLE) ×2 IMPLANT
TOWEL OR 17X26 10 PK STRL BLUE (TOWEL DISPOSABLE) ×2 IMPLANT
TUBE CONNECTING 12X1/4 (SUCTIONS) ×2 IMPLANT
WATER STERILE IRR 1000ML POUR (IV SOLUTION) ×2 IMPLANT

## 2012-08-09 NOTE — Anesthesia Postprocedure Evaluation (Signed)
Anesthesia Post Note  Patient: Jason [name]  Procedure(s) Performed: Procedure(s) (LRB): AMPUTATION DIGIT (Right)  Anesthesia type: general  Patient location: PACU  Post pain: Pain level controlled  Post assessment: Patient's Cardiovascular Status Stable  Last Vitals:  Filed Vitals:   08/09/12 1013  BP: 134/75  Pulse: 67  Temp: 36.3 C  Resp: 18    Post vital signs: Reviewed and stable  Level of consciousness: sedated  Complications: No apparent anesthesia complications

## 2012-08-09 NOTE — Transfer of Care (Signed)
Immediate Anesthesia Transfer of Care Note  Patient: Jason [name]  Procedure(s) Performed: Procedure(s) (LRB) with comments: AMPUTATION DIGIT (Right) - Amputation right Great Toe MTP Joint  Patient Location: PACU  Anesthesia Type: General  Level of Consciousness: awake, alert  and oriented  Airway & Oxygen Therapy: Patient Spontanous Breathing and Patient connected to nasal cannula oxygen  Post-op Assessment: Report given to PACU RN, Post -op Vital signs reviewed and stable and Patient moving all extremities X 4  Post vital signs: Reviewed and stable  Complications: No apparent anesthesia complications

## 2012-08-09 NOTE — H&P (Signed)
Jason [name] is an 71 y.o. male.   Chief Complaint: Osteomyelitis ulceration right great toe HPI: Patient is a 71 year old gentleman who presents after failure of conservative treatment for osteomyelitis of the right great toe.  Past Medical History  Diagnosis Date  . Unspecified sinusitis (chronic)   . Pneumonia, organism unspecified   . Mitral valve disorders   . Unspecified venous (peripheral) insufficiency   . Type II or unspecified type diabetes mellitus without mention of complication, not stated as uncontrolled   . Obesity, unspecified   . Esophageal reflux   . Diverticulosis of colon (without mention of hemorrhage)   . Irritable bowel syndrome   . Benign neoplasm of colon   . History of BPH   . History of gynecomastia     Unilateral  . DJD (degenerative joint disease)   . Hidradenitis   . Sleep apnea     NEVER HAD STUDY ...     Past Surgical History  Procedure Date  . Knee arthroscopy   . Toe amputation 06/2009    Right foot second toe -Dr Lestine Box  . Laser ablation 06/2009    Left Greater Saphenous vein -Dr Arbie Cookey  . Circumcision 06/2100    For Phimosis - Dr Marcello Fennel  . Laparotomy 07/20/11  . Colon surgery   . Tonsillectomy     Family History  Problem Relation Age of Onset  . Diabetes Father   . Cancer Father     Prostate  . Stroke Father    Social History:  reports that he quit smoking about 40 years ago. His smoking use included Cigarettes. He has never used smokeless tobacco. He reports that he drinks about 5.4 ounces of alcohol per week. He reports that he does not use illicit drugs.  Allergies: No Known Allergies  No prescriptions prior to admission    Results for orders placed during the hospital encounter of 08/07/12 (from the past 48 hour(s))  SURGICAL PCR SCREEN     Status: Normal   Collection Time   08/07/12  3:48 PM      Component Value Range Comment   MRSA, PCR NEGATIVE  NEGATIVE    Staphylococcus aureus NEGATIVE  NEGATIVE   APTT      Status: Normal   Collection Time   08/07/12  3:58 PM      Component Value Range Comment   aPTT 35  24 - 37 seconds   CBC     Status: Abnormal   Collection Time   08/07/12  3:58 PM      Component Value Range Comment   WBC 6.3  4.0 - 10.5 K/uL    RBC 3.81 (*) 4.22 - 5.81 MIL/uL    Hemoglobin 12.0 (*) 13.0 - 17.0 g/dL    HCT 16.1 (*) 09.6 - 52.0 %    MCV 94.2  78.0 - 100.0 fL    MCH 31.5  26.0 - 34.0 pg    MCHC 33.4  30.0 - 36.0 g/dL    RDW 04.5  40.9 - 81.1 %    Platelets 201  150 - 400 K/uL   COMPREHENSIVE METABOLIC PANEL     Status: Abnormal   Collection Time   08/07/12  3:58 PM      Component Value Range Comment   Sodium 140  135 - 145 mEq/L    Potassium 4.4  3.5 - 5.1 mEq/L    Chloride 104  96 - 112 mEq/L    CO2 27  19 - 32 mEq/L  Glucose, Bld 222 (*) 70 - 99 mg/dL    BUN 12  6 - 23 mg/dL    Creatinine, Ser 1.61  0.50 - 1.35 mg/dL    Calcium 9.6  8.4 - 09.6 mg/dL    Total Protein 7.8  6.0 - 8.3 g/dL    Albumin 3.6  3.5 - 5.2 g/dL    AST 19  0 - 37 U/L    ALT 18  0 - 53 U/L    Alkaline Phosphatase 47  39 - 117 U/L    Total Bilirubin 0.4  0.3 - 1.2 mg/dL    GFR calc non Af Amer 83 (*) >90 mL/min    GFR calc Af Amer >90  >90 mL/min   PROTIME-INR     Status: Normal   Collection Time   08/07/12  3:58 PM      Component Value Range Comment   Prothrombin Time 14.2  11.6 - 15.2 seconds    INR 1.11  0.00 - 1.49    Dg Chest 2 View  08/07/2012  *RADIOLOGY REPORT*  Clinical Data: Preop orthopedic surgery  CHEST - 2 VIEW  Comparison: 07/31/2011  Findings: Mild left basilar scarring/atelectasis.  Lungs are otherwise clear.  No pleural effusion or pneumothorax.  Cardiomediastinal silhouette is within normal limits.  Mild degenerative changes of the visualized thoracolumbar spine.  IMPRESSION: No evidence of acute cardiopulmonary disease.   Original Report Authenticated By: Charline Bills, M.D.     Review of Systems  All other systems reviewed and are negative.    There were no  vitals taken for this visit. Physical Exam on examination patient has ulceration and osteomyelitis of right great toe. He does have diminished pulses she does have diabetic insensate neuropathy.  Assessment/Plan Assessment: Diabetic insensate neuropathy with [name] grade 3 ulcer and osteomyelitis of the right great toe.  Plan. Will plan for right great toe amputation at the MTP joint. Risks and benefits were discussed including infection neurovascular injury nonhealing of the incision need for higher level amputation. Patient states he understands and wished to proceed at this time.  [name] V 08/09/2012, 6:19 AM

## 2012-08-09 NOTE — Op Note (Signed)
OPERATIVE REPORT  DATE OF SURGERY: 08/09/2012  PATIENT:  Jason [name],  71 y.o. male  PRE-OPERATIVE DIAGNOSIS:  Right Great Toe Osteomyelitis  POST-OPERATIVE DIAGNOSIS:  Right Great Toe Osteomyelitis  PROCEDURE:  Procedure(s): AMPUTATION DIGIT  SURGEON:  Surgeon(s): Nadara Mustard, MD  ANESTHESIA:   general  EBL:  Minimal ML  SPECIMEN:  No Specimen  TOURNIQUET:  * No tourniquets in log *  PROCEDURE DETAILS: Patient is a 71 year old gentleman diabetic insensate neuropathy with Wagner grade 3 ulceration osteomyelitis of the right great toe. Patient has failed conservative treatment and presents at this time for amputation through the MTP joint. Risks and benefits were discussed including infection neurovascular injury nonhealing of the wound. Patient states he understands and wished to proceed at this time. Description of procedure patient was brought to the operating room and underwent a general anesthetic. After adequate levels of anesthesia were obtained patient's right lower extremity was prepped using DuraPrep draped into a sterile field. A fishmouth incision was made distal to the MTP joint. The toe was amputated through the MTP joint. Hemostasis was obtained the wound was irrigated there was no signs of any deep infection at the amputation site. The incision was closed using 2-0 nylon. The wound is covered with Adaptic orthopedic sponges AB dressing Kerlix and Coban. Patient is extubated taken to the PACU in stable condition discharge to home prescription for Vicodin for pain.  PLAN OF CARE: Discharge to home after PACU  PATIENT DISPOSITION:  PACU - hemodynamically stable.   Nadara Mustard, MD 08/09/2012 12:55 PM

## 2012-08-09 NOTE — Progress Notes (Signed)
Fitted for post-op shoe and DME crutches for home.

## 2012-08-09 NOTE — Anesthesia Preprocedure Evaluation (Addendum)
Anesthesia Evaluation  Patient identified by MRN, date of birth, ID band Patient awake    Reviewed: Allergy & Precautions, H&P , NPO status , Patient's Chart, lab work & pertinent test results, reviewed documented beta blocker date and time , Unable to perform ROS - Chart review only  Airway Mallampati: II TM Distance: >3 FB Neck ROM: full    Dental  (+) Dental Advidsory Given, Edentulous Upper and Poor Dentition   Pulmonary sleep apnea ,          Cardiovascular     Neuro/Psych    GI/Hepatic GERD-  Controlled and Medicated,  Endo/Other  diabetes, Well Controlled, Type 2  Renal/GU      Musculoskeletal   Abdominal   Peds  Hematology   Anesthesia Other Findings   Reproductive/Obstetrics                           Anesthesia Physical Anesthesia Plan  ASA: III  Anesthesia Plan: General   Post-op Pain Management:    Induction: Intravenous  Airway Management Planned: LMA  Additional Equipment:   Intra-op Plan:   Post-operative Plan: Extubation in OR  Informed Consent: I have reviewed the patients History and Physical, chart, labs and discussed the procedure including the risks, benefits and alternatives for the proposed anesthesia with the patient or authorized representative who has indicated his/her understanding and acceptance.   Dental Advisory Given  Plan Discussed with: CRNA and Surgeon  Anesthesia Plan Comments:        Anesthesia Quick Evaluation

## 2012-08-09 NOTE — Anesthesia Procedure Notes (Signed)
Procedure Name: LMA Insertion and Intubation Date/Time: 08/09/2012 12:23 PM Performed by: Carmela Rima Pre-anesthesia Checklist: Patient identified, Timeout performed, Emergency Drugs available, Suction available and Patient being monitored Patient Re-evaluated:Patient Re-evaluated prior to inductionOxygen Delivery Method: Circle system utilized Preoxygenation: Pre-oxygenation with 100% oxygen Intubation Type: IV induction and Rapid sequence Ventilation: Mask ventilation without difficulty LMA: LMA with gastric port inserted LMA Size: 5.0 Laryngoscope Size: Mac and 4 Grade View: Grade II Tube type: Oral Tube size: 7.5 mm Number of attempts: 2 Placement Confirmation: ETT inserted through vocal cords under direct vision,  breath sounds checked- equal and bilateral and positive ETCO2 (LMA failure.  ETT placed without diff.  RSI.  Easy mask) Secured at: 24 cm Tube secured with: Tape Dental Injury: Teeth and Oropharynx as per pre-operative assessment

## 2012-08-09 NOTE — Progress Notes (Signed)
Orthopedic Tech Progress Note Patient Details:  Jason [name] 04-21-1941 [id number]  Ortho Devices Type of Ortho Device: Crutches;Postop boot Ortho Device/Splint Location: right post op shoe Ortho Device/Splint Interventions: Application   Cammer, Mickie Bail 08/09/2012, 2:08 PM

## 2012-08-10 ENCOUNTER — Encounter (HOSPITAL_COMMUNITY): Payer: Self-pay | Admitting: Orthopedic Surgery

## 2012-08-21 ENCOUNTER — Ambulatory Visit (INDEPENDENT_AMBULATORY_CARE_PROVIDER_SITE_OTHER): Payer: Medicare Other

## 2012-08-21 ENCOUNTER — Telehealth: Payer: Self-pay | Admitting: Pulmonary Disease

## 2012-08-21 DIAGNOSIS — Z23 Encounter for immunization: Secondary | ICD-10-CM

## 2012-08-21 NOTE — Telephone Encounter (Signed)
Called, spoke with pt's spouse, Malachi Bonds.  Was advised pt is not home.  She will ask he call back.

## 2012-08-21 NOTE — Telephone Encounter (Signed)
Pt is returning triage's call.  Holly D Pryor ° °

## 2012-08-21 NOTE — Telephone Encounter (Signed)
Attempted to leave message but mailbox was full.  Will try back later.

## 2012-08-22 DIAGNOSIS — Z23 Encounter for immunization: Secondary | ICD-10-CM

## 2012-08-22 NOTE — Telephone Encounter (Signed)
[email or web address].Karlos Scadden, CMA  

## 2012-08-23 NOTE — Telephone Encounter (Signed)
Spoke with pt's spouse. She states that since increasing his glimipiride to 1 bid his BS readings have been lower. She states the lowest was 99. I advised that this is okay, but she wants to check and make sure that SN wants him to continue taking 1 bid. Please advise, thanks!

## 2012-08-23 NOTE — Telephone Encounter (Signed)
Pt returned triage's call.  Pt states he wants to verbally give [place] permission to speak w/ his spouse for his medical care if he is not available to take a call.     Antionette Fairy

## 2012-08-23 NOTE — Telephone Encounter (Signed)
LMTCB

## 2012-08-23 NOTE — Telephone Encounter (Signed)
Per SN---  pts meds should be  Metformin 500 mg  1 po bid and the glimepiride 2 mg  1 every am.  Called and spoke with pts wife and she is aware of this and wanted to make sure that the pt is taking the correct dose of each meds. Nothing further is needed.

## 2012-10-16 ENCOUNTER — Other Ambulatory Visit (HOSPITAL_COMMUNITY): Payer: Self-pay | Admitting: Orthopedic Surgery

## 2012-10-16 ENCOUNTER — Encounter (HOSPITAL_COMMUNITY): Payer: Self-pay | Admitting: *Deleted

## 2012-10-17 ENCOUNTER — Encounter (HOSPITAL_COMMUNITY): Payer: Self-pay | Admitting: Anesthesiology

## 2012-10-17 ENCOUNTER — Encounter (HOSPITAL_COMMUNITY)
Admission: RE | Disposition: A | Discharge status: Home / Self Care | Payer: Self-pay | Source: Ambulatory Visit | Attending: Orthopedic Surgery

## 2012-10-17 ENCOUNTER — Ambulatory Visit (HOSPITAL_COMMUNITY): Payer: Medicare Other | Admitting: Anesthesiology

## 2012-10-17 ENCOUNTER — Ambulatory Visit (HOSPITAL_COMMUNITY)
Admission: RE | Admit: 2012-10-17 | Discharge: 2012-10-17 | Disposition: A | Discharge status: Home / Self Care | Payer: Medicare Other | Source: Ambulatory Visit | Attending: Orthopedic Surgery | Admitting: Orthopedic Surgery

## 2012-10-17 ENCOUNTER — Encounter (HOSPITAL_COMMUNITY): Payer: Self-pay | Admitting: *Deleted

## 2012-10-17 DIAGNOSIS — M869 Osteomyelitis, unspecified: Secondary | ICD-10-CM | POA: Insufficient documentation

## 2012-10-17 DIAGNOSIS — L97509 Non-pressure chronic ulcer of other part of unspecified foot with unspecified severity: Secondary | ICD-10-CM | POA: Insufficient documentation

## 2012-10-17 DIAGNOSIS — E1169 Type 2 diabetes mellitus with other specified complication: Secondary | ICD-10-CM | POA: Insufficient documentation

## 2012-10-17 DIAGNOSIS — M9080 Osteopathy in diseases classified elsewhere, unspecified site: Secondary | ICD-10-CM | POA: Insufficient documentation

## 2012-10-17 DIAGNOSIS — E1142 Type 2 diabetes mellitus with diabetic polyneuropathy: Secondary | ICD-10-CM | POA: Insufficient documentation

## 2012-10-17 DIAGNOSIS — E1149 Type 2 diabetes mellitus with other diabetic neurological complication: Secondary | ICD-10-CM | POA: Insufficient documentation

## 2012-10-17 HISTORY — PX: AMPUTATION: SHX166

## 2012-10-17 HISTORY — DX: Nonrheumatic mitral (valve) prolapse: I34.1

## 2012-10-17 HISTORY — DX: Major depressive disorder, single episode, unspecified: F32.9

## 2012-10-17 HISTORY — DX: Depression, unspecified: F32.A

## 2012-10-17 LAB — APTT: aPTT: 33 seconds (ref 24–37)

## 2012-10-17 LAB — CBC
Hemoglobin: 13.8 g/dL (ref 13.0–17.0)
MCH: 31.1 pg (ref 26.0–34.0)
Platelets: 165 10*3/uL (ref 150–400)
RBC: 4.44 MIL/uL (ref 4.22–5.81)
WBC: 5.2 10*3/uL (ref 4.0–10.5)

## 2012-10-17 LAB — COMPREHENSIVE METABOLIC PANEL
ALT: 17 U/L (ref 0–53)
AST: 17 U/L (ref 0–37)
Alkaline Phosphatase: 49 U/L (ref 39–117)
CO2: 29 mEq/L (ref 19–32)
Chloride: 101 mEq/L (ref 96–112)
GFR calc Af Amer: >90 mL/min (ref >90)
GFR calc non Af Amer: 84 mL/min — ABNORMAL LOW (ref >90)
Glucose, Bld: 182 mg/dL — ABNORMAL HIGH (ref 70–99)
Potassium: 4.3 mEq/L (ref 3.5–5.1)
Sodium: 136 mEq/L (ref 135–145)

## 2012-10-17 LAB — GLUCOSE, CAPILLARY: Glucose-Capillary: 141 mg/dL — ABNORMAL HIGH (ref 70–99)

## 2012-10-17 SURGERY — AMPUTATION DIGIT
Anesthesia: Monitor Anesthesia Care | Site: Foot | Laterality: Right | Wound class: Dirty or Infected

## 2012-10-17 MED ORDER — ONDANSETRON HCL 4 MG/2ML IJ SOLN: 4.0000 mg | Freq: Once | INTRAMUSCULAR | Status: DC | PRN

## 2012-10-17 MED ORDER — CHLORHEXIDINE GLUCONATE 4 % EX LIQD: 60.0000 mL | Freq: Once | CUTANEOUS | Status: DC

## 2012-10-17 MED ORDER — FENTANYL CITRATE 0.05 MG/ML IJ SOLN
INTRAMUSCULAR | Status: DC | PRN
  Administered 2012-10-17: 50 ug via INTRAVENOUS

## 2012-10-17 MED ORDER — MIDAZOLAM HCL 2 MG/2ML IJ SOLN
1.0000 mg | INTRAMUSCULAR | Status: DC | PRN
  Administered 2012-10-17: 2 mg via INTRAVENOUS

## 2012-10-17 MED ORDER — MUPIROCIN 2 % EX OINT
TOPICAL_OINTMENT | Freq: Two times a day (BID) | CUTANEOUS | Status: DC
  Administered 2012-10-17: 1 via NASAL
  Filled 2012-10-17: qty 22

## 2012-10-17 MED ORDER — FENTANYL CITRATE 0.05 MG/ML IJ SOLN
INTRAMUSCULAR | Status: AC
  Filled 2012-10-17: qty 2

## 2012-10-17 MED ORDER — 0.9 % SODIUM CHLORIDE (POUR BTL) OPTIME
TOPICAL | Status: DC | PRN
  Administered 2012-10-17: 1000 mL

## 2012-10-17 MED ORDER — PROPOFOL INFUSION 10 MG/ML OPTIME
INTRAVENOUS | Status: DC | PRN
  Administered 2012-10-17: 100 ug/kg/min via INTRAVENOUS

## 2012-10-17 MED ORDER — LACTATED RINGERS IV SOLN
INTRAVENOUS | Status: DC | PRN
  Administered 2012-10-17: 10:00:00 via INTRAVENOUS

## 2012-10-17 MED ORDER — LACTATED RINGERS IV SOLN
INTRAVENOUS | Status: DC
  Administered 2012-10-17: 10:00:00 via INTRAVENOUS

## 2012-10-17 MED ORDER — DEXTROSE 5 % IV SOLN
3.0000 g | INTRAVENOUS | Status: AC
  Administered 2012-10-17: 3 g via INTRAVENOUS
  Filled 2012-10-17: qty 3000

## 2012-10-17 MED ORDER — HYDROCODONE-ACETAMINOPHEN 5-500 MG PO TABS: 1.0000 | ORAL_TABLET | Freq: Four times a day (QID) | ORAL | Status: DC | PRN

## 2012-10-17 MED ORDER — HYDROMORPHONE HCL PF 1 MG/ML IJ SOLN: 0.2500 mg | INTRAMUSCULAR | Status: DC | PRN

## 2012-10-17 MED ORDER — MUPIROCIN 2 % EX OINT
TOPICAL_OINTMENT | CUTANEOUS | Status: AC
  Administered 2012-10-17: 1 via NASAL
  Filled 2012-10-17: qty 22

## 2012-10-17 SURGICAL SUPPLY — 48 items
BANDAGE GAUZE 4  KLING STR (GAUZE/BANDAGES/DRESSINGS) IMPLANT
BANDAGE GAUZE ELAST BULKY 4 IN (GAUZE/BANDAGES/DRESSINGS) ×4 IMPLANT
BLADE AVERAGE 25X9 (BLADE) IMPLANT
BLADE MINI RND TIP GREEN BEAV (BLADE) IMPLANT
BNDG COHESIVE 1X5 TAN STRL LF (GAUZE/BANDAGES/DRESSINGS) IMPLANT
BNDG COHESIVE 6X5 TAN STRL LF (GAUZE/BANDAGES/DRESSINGS) ×4 IMPLANT
BNDG ESMARK 4X9 LF (GAUZE/BANDAGES/DRESSINGS) IMPLANT
BNDG GAUZE STRTCH 6 (GAUZE/BANDAGES/DRESSINGS) IMPLANT
CLOTH BEACON ORANGE TIMEOUT ST (SAFETY) ×2 IMPLANT
CORDS BIPOLAR (ELECTRODE) IMPLANT
COVER SURGICAL LIGHT HANDLE (MISCELLANEOUS) ×2 IMPLANT
CUFF TOURNIQUET SINGLE 18IN (TOURNIQUET CUFF) IMPLANT
CUFF TOURNIQUET SINGLE 24IN (TOURNIQUET CUFF) IMPLANT
CUFF TOURNIQUET SINGLE 34IN LL (TOURNIQUET CUFF) IMPLANT
CUFF TOURNIQUET SINGLE 44IN (TOURNIQUET CUFF) IMPLANT
DRAPE U-SHAPE 47X51 STRL (DRAPES) ×2 IMPLANT
DRSG ADAPTIC 3X8 NADH LF (GAUZE/BANDAGES/DRESSINGS) ×4 IMPLANT
DRSG PAD ABDOMINAL 8X10 ST (GAUZE/BANDAGES/DRESSINGS) ×2 IMPLANT
DURAPREP 26ML APPLICATOR (WOUND CARE) ×2 IMPLANT
ELECT REM PT RETURN 9FT ADLT (ELECTROSURGICAL) ×2
ELECTRODE REM PT RTRN 9FT ADLT (ELECTROSURGICAL) ×1 IMPLANT
GAUZE SPONGE 2X2 8PLY STRL LF (GAUZE/BANDAGES/DRESSINGS) IMPLANT
GLOVE BIOGEL PI IND STRL 9 (GLOVE) ×1 IMPLANT
GLOVE BIOGEL PI INDICATOR 9 (GLOVE) ×1
GLOVE SURG ORTHO 9.0 STRL STRW (GLOVE) ×2 IMPLANT
GOWN PREVENTION PLUS XLARGE (GOWN DISPOSABLE) ×2 IMPLANT
GOWN SRG XL XLNG 56XLVL 4 (GOWN DISPOSABLE) ×1 IMPLANT
GOWN STRL NON-REIN XL XLG LVL4 (GOWN DISPOSABLE) ×1
KIT BASIN OR (CUSTOM PROCEDURE TRAY) ×2 IMPLANT
KIT ROOM TURNOVER OR (KITS) ×2 IMPLANT
MANIFOLD NEPTUNE II (INSTRUMENTS) IMPLANT
NEEDLE HYPO 25GX1X1/2 BEV (NEEDLE) IMPLANT
NS IRRIG 1000ML POUR BTL (IV SOLUTION) ×2 IMPLANT
PACK ORTHO EXTREMITY (CUSTOM PROCEDURE TRAY) ×2 IMPLANT
PAD ARMBOARD 7.5X6 YLW CONV (MISCELLANEOUS) ×4 IMPLANT
PAD CAST 4YDX4 CTTN HI CHSV (CAST SUPPLIES) IMPLANT
PADDING CAST COTTON 4X4 STRL (CAST SUPPLIES)
SPECIMEN JAR SMALL (MISCELLANEOUS) ×2 IMPLANT
SPONGE GAUZE 2X2 STER 10/PKG (GAUZE/BANDAGES/DRESSINGS)
SPONGE GAUZE 4X4 12PLY (GAUZE/BANDAGES/DRESSINGS) ×4 IMPLANT
SUCTION FRAZIER TIP 10 FR DISP (SUCTIONS) IMPLANT
SUT ETHILON 2 0 PSLX (SUTURE) ×2 IMPLANT
SUT VIC AB 2-0 FS1 27 (SUTURE) IMPLANT
SYR CONTROL 10ML LL (SYRINGE) IMPLANT
TOWEL OR 17X24 6PK STRL BLUE (TOWEL DISPOSABLE) ×2 IMPLANT
TOWEL OR 17X26 10 PK STRL BLUE (TOWEL DISPOSABLE) ×2 IMPLANT
TUBE CONNECTING 12X1/4 (SUCTIONS) IMPLANT
WATER STERILE IRR 1000ML POUR (IV SOLUTION) IMPLANT

## 2012-10-17 NOTE — Transfer of Care (Signed)
Immediate Anesthesia Transfer of Care Note  Patient: Jason [name]  Procedure(s) Performed: Procedure(s) (LRB) with comments: AMPUTATION DIGIT (Right) - Right Foot 3rd Toe Amputation at Metatarsophalangeal Joint  Patient Location: PACU  Anesthesia Type:MAC  Level of Consciousness: awake, alert  and oriented  Airway & Oxygen Therapy: Patient Spontanous Breathing and Patient connected to face mask oxygen  Post-op Assessment: Report given to PACU RN  Post vital signs: Reviewed and stable  Complications: No apparent anesthesia complications

## 2012-10-17 NOTE — Preoperative (Signed)
Beta Blockers   Reason not to administer Beta Blockers:Not Applicable 

## 2012-10-17 NOTE — Anesthesia Postprocedure Evaluation (Signed)
  Anesthesia Post-op Note  Patient: Jason [name]  Procedure(s) Performed: Procedure(s) (LRB) with comments: AMPUTATION DIGIT (Right) - Right Foot 3rd Toe Amputation at Metatarsophalangeal Joint  Patient Location: PACU  Anesthesia Type:MAC and Regional  Level of Consciousness: awake, alert , oriented and patient cooperative  Airway and Oxygen Therapy: Patient Spontanous Breathing  Post-op Pain: none  Post-op Assessment: Post-op Vital signs reviewed, Patient's Cardiovascular Status Stable, Respiratory Function Stable, Patent Airway, No signs of Nausea or vomiting and Pain level controlled  Post-op Vital Signs: stable  Complications: No apparent anesthesia complications

## 2012-10-17 NOTE — H&P (Signed)
Jason [name] is an 71 y.o. male.   Chief Complaint: Ulceration osteomyelitis right foot third toe HPI: Patient is a 71 year old gentleman type II diabetic peripheral vascular disease who presents with chronic infection right foot third toe  Past Medical History  Diagnosis Date  . Unspecified sinusitis (chronic)   . Mitral valve disorders   . Unspecified venous (peripheral) insufficiency   . Type II or unspecified type diabetes mellitus without mention of complication, not stated as uncontrolled   . Obesity, unspecified   . Esophageal reflux   . Diverticulosis of colon (without mention of hemorrhage)   . Irritable bowel syndrome   . Benign neoplasm of colon   . History of BPH   . History of gynecomastia     Unilateral  . DJD (degenerative joint disease)   . Hidradenitis   . MVP (mitral valve prolapse)   . Pneumonia, organism unspecified 2009    Past Surgical History  Procedure Date  . Knee arthroscopy   . Toe amputation 06/2009    Right foot second toe -Dr Lestine Box  . Laser ablation 06/2009    Left Greater Saphenous vein -Dr Arbie Cookey  . Circumcision 06/2100    For Phimosis - Dr Marcello Fennel  . Laparotomy 07/20/11  . Colon surgery   . Tonsillectomy   . Amputation 08/09/2012    Procedure: AMPUTATION DIGIT;  Surgeon: Nadara Mustard, MD;  Location: [facility] OR;  Service: Orthopedics;  Laterality: Right;  Amputation right Great Toe MTP Joint    Family History  Problem Relation Age of Onset  . Diabetes Father   . Cancer Father     Prostate  . Stroke Father    Social History:  reports that he quit smoking about 40 years ago. His smoking use included Cigarettes. He has never used smokeless tobacco. He reports that he drinks about 4.2 ounces of alcohol per week. He reports that he does not use illicit drugs.  Allergies: No Known Allergies  No prescriptions prior to admission    No results found for this or any previous visit (from the past 48 hour(s)). No results found.  Review of  Systems  All other systems reviewed and are negative.    Height 6\' 8"  (2.032 m), weight 142.883 kg (315 lb). Physical Exam  On examination patient has palpable pulses radiographs shows destructive bony changes there is ulceration and swelling to the third toe right foot Assessment/Plan Ulceration osteomyelitis right foot third toe.  Plan: Will plan for third toe amputation at the MTP joint. Risks and benefits were discussed including infection neurovascular injury nonhealing of the wound potential for additional surgery. Patient states he understands and wished to proceed at this time.  Akirra Lacerda V 10/17/2012, 6:05 AM

## 2012-10-17 NOTE — Progress Notes (Signed)
Orthopedic Tech Progress Note Patient Details:  Jason [name] 1941/04/04 [id number]  Ortho Devices Type of Ortho Device: Postop shoe/boot Ortho Device/Splint Location: right LE Ortho Device/Splint Interventions: Application   Seona Clemenson T 10/17/2012, 12:23 PM

## 2012-10-17 NOTE — Anesthesia Preprocedure Evaluation (Addendum)
Anesthesia Evaluation  Patient identified by MRN, date of birth, ID band Patient awake    Reviewed: Allergy & Precautions, H&P , NPO status , Patient's Chart, lab work & pertinent test results, reviewed documented beta blocker date and time   Airway Mallampati: I TM Distance: >3 FB Neck ROM: Full    Dental  (+) Edentulous Upper and Dental Advisory Given   Pulmonary sleep apnea ,          Cardiovascular + Valvular Problems/Murmurs MVP Rhythm:regular Rate:Normal     Neuro/Psych Depression    GI/Hepatic GERD-  ,  Endo/Other  diabetes, Oral Hypoglycemic Agents  Renal/GU      Musculoskeletal   Abdominal   Peds  Hematology   Anesthesia Other Findings   Reproductive/Obstetrics                          Anesthesia Physical Anesthesia Plan  ASA: III  Anesthesia Plan: MAC and Regional   Post-op Pain Management:    Induction: Intravenous  Airway Management Planned: Mask  Additional Equipment:   Intra-op Plan:   Post-operative Plan:   Informed Consent: I have reviewed the patients History and Physical, chart, labs and discussed the procedure including the risks, benefits and alternatives for the proposed anesthesia with the patient or authorized representative who has indicated his/her understanding and acceptance.   Dental advisory given  Plan Discussed with: Anesthesiologist and Surgeon  Anesthesia Plan Comments:        Anesthesia Quick Evaluation

## 2012-10-17 NOTE — Addendum Note (Signed)
Addendum  created 10/17/12 1507 by Theodosia Quay, CRNA   Modules edited:Charges VN

## 2012-10-17 NOTE — Op Note (Signed)
OPERATIVE REPORT  DATE OF SURGERY: 10/17/2012  PATIENT:  Jason [name],  71 y.o. male  PRE-OPERATIVE DIAGNOSIS:  osteomyelitis Right Foot 3rd toe  POST-OPERATIVE DIAGNOSIS:  osteomyelitis Right Foot 3rd toe  PROCEDURE:  Procedure(s): AMPUTATION DIGIT right foot third toe  SURGEON:  Surgeon(s): Nadara Mustard, MD  ANESTHESIA:   regional  EBL:  Minimal ML  SPECIMEN:  Source of Specimen:  Third toe right foot  TOURNIQUET:  * No tourniquets in log *  PROCEDURE DETAILS: Patient is a 71 year old gentleman diabetic neuropathy with osteomyelitis ulceration and abscess third toe right foot. Patient failed conservative treatment and presents at this time for amputation. Risks and benefits were discussed including infection neurovascular injury nonhealing of the wound need for additional surgery. Patient states he understands was pursued this time. Should procedure patient brought to the operating room after undergoing a ankle block. After adequate levels and anesthesia obtained patient's right lower extremity was prepped using Hibiclens) scrubbed dried prepped with DuraPrep and draped into a sterile field. A fishmouth incision was made distal to the MTP joint right foot third toe. The toe was amputated through the MTP joint. Wound was irrigated with normal saline hemostasis was obtained. The incision was closed using 2-0 nylon. The wound was covered with Adaptic orthopedic sponges AB dressing Kerlix and Coban. Patient was taken to the PACU in stable condition.  PLAN OF CARE: Discharge to home after PACU  PATIENT DISPOSITION:  PACU - hemodynamically stable.   Nadara Mustard, MD 10/17/2012 10:57 AM

## 2012-10-17 NOTE — Anesthesia Postprocedure Evaluation (Signed)
  Anesthesia Post-op Note  Patient: Jason [name]  Procedure(s) Performed: Procedure(s) (LRB) with comments: AMPUTATION DIGIT (Right) - Right Foot 3rd Toe Amputation at Metatarsophalangeal Joint  Patient Location: PACU  Anesthesia Type:MAC and Regional  Level of Consciousness: awake, oriented and patient cooperative  Airway and Oxygen Therapy: Patient Spontanous Breathing  Post-op Pain: none  Post-op Assessment: Post-op Vital signs reviewed, Patient's Cardiovascular Status Stable, Respiratory Function Stable, Patent Airway, No signs of Nausea or vomiting and Pain level controlled  Post-op Vital Signs: stable  Complications: No apparent anesthesia complications

## 2012-10-17 NOTE — Anesthesia Procedure Notes (Signed)
Anesthesia Regional Block:  Ankle block  Pre-Anesthetic Checklist: ,, timeout performed, Correct Patient, Correct Site, Correct Laterality, Correct Procedure, Correct Position, site marked, Risks and benefits discussed,  Surgical consent,  Pre-op evaluation,  At surgeon's request and post-op pain management  Laterality: Right  Prep: Maximum Sterile Barrier Precautions used, chloraprep and alcohol swabs       Needles:  Injection technique: Single-shot      Needle Gauge: 25 and 25 G  Needle insertion depth: 8 cm   Additional Needles:  Procedures: nerve stimulator Ankle block Narrative:  Start time: 10/17/2012 10:05 AM End time: 10/17/2012 10:10 AM Injection made incrementally with aspirations every 5 mL.  Performed by: Personally  Anesthesiologist: Maren Beach MD  Additional Notes: Pt accepts procedure and risks. 40cc ( 20cc 2% Lidocaine and 20cc 0.5% Marcaine ) for Ant, Post Tibial and Sural N. Pt tolerated  Well.

## 2012-10-18 ENCOUNTER — Encounter (HOSPITAL_COMMUNITY): Payer: Self-pay | Admitting: Orthopedic Surgery

## 2012-11-27 ENCOUNTER — Ambulatory Visit (INDEPENDENT_AMBULATORY_CARE_PROVIDER_SITE_OTHER): Payer: Medicare Other | Admitting: Pulmonary Disease

## 2012-11-27 ENCOUNTER — Encounter: Payer: Self-pay | Admitting: Pulmonary Disease

## 2012-11-27 VITALS — BP 128/72 | HR 74 | Temp 97.8°F | Ht >= 80 in | Wt 325.6 lb

## 2012-11-27 DIAGNOSIS — D126 Benign neoplasm of colon, unspecified: Secondary | ICD-10-CM

## 2012-11-27 DIAGNOSIS — M1990 Unspecified osteoarthritis, unspecified site: Secondary | ICD-10-CM

## 2012-11-27 DIAGNOSIS — E7800 Pure hypercholesterolemia, unspecified: Secondary | ICD-10-CM

## 2012-11-27 DIAGNOSIS — I872 Venous insufficiency (chronic) (peripheral): Secondary | ICD-10-CM

## 2012-11-27 DIAGNOSIS — Z87898 Personal history of other specified conditions: Secondary | ICD-10-CM

## 2012-11-27 DIAGNOSIS — E119 Type 2 diabetes mellitus without complications: Secondary | ICD-10-CM

## 2012-11-27 DIAGNOSIS — K219 Gastro-esophageal reflux disease without esophagitis: Secondary | ICD-10-CM

## 2012-11-27 DIAGNOSIS — I059 Rheumatic mitral valve disease, unspecified: Secondary | ICD-10-CM

## 2012-11-27 DIAGNOSIS — K5730 Diverticulosis of large intestine without perforation or abscess without bleeding: Secondary | ICD-10-CM

## 2012-11-27 DIAGNOSIS — E669 Obesity, unspecified: Secondary | ICD-10-CM

## 2012-11-27 DIAGNOSIS — K589 Irritable bowel syndrome without diarrhea: Secondary | ICD-10-CM

## 2012-11-27 NOTE — Progress Notes (Signed)
Subjective:    Patient ID: Jason [name], male    DOB: 1941/03/18, 73 y.o.   MRN: [id number]  HPI 72 y/o BM here for a follow up visit... he has mult med problems as listed below>>  ~  August 30, 2011:  32mo ROV & post HOSP visit> Adm 9/12 for SBO & required ELap w/ lysis of adhesions by DrBlackman, [facility]; post-op problems included ileus, cough, pleural effusion, & minor wound complic managed as outpt & now back to baseline...  ~  November 25, 2011:  93mo ROV & he reports stable, just ate too much over the holidays and his wt is up 19# to 329# today; we reviewed diet, exercise & wt reduction strategies "I'm going to work on it"... He is not fasting today & will ret next week for full fasting labs>>    MVP>  On ASA daily & 2DEcho 10/11 showed mild LVH, norm LVF w/ EF= 55-60%, ant leaflet MVP seen w/ MR, mod LAdil; he denies CP, palpit, SOB, edema...    Ven Insuffic>  Hx VV, VI, stasis changes w/ ulcer/ cellulitis in past; s/p laser to left GSV by DrEarly & s/p right 2nd toe amp for osteomyelitis by Volney Presser 2010.    Chol>  On Simva40 (taking 1/2 of the 80) and last FLP 3/12 showed TChol 141, TG 55, HDL 50, LDL 81... keep same dose + better diet & get wt down!    DM>  On Metformin 500Bid & Glimep 1mg - 1/2Qam; BS at home are sl up w/ wt gain per pt; f/u BS/ A1c ==> pending.    Obesity>  weight is back up 19# to 329# today; we reviewed the critical need for wt reducting diet + exercise program...    GI>  Adm 9/12 w/ SBO & had ELap w/ lysis of adhesions;  Now improved & back to baseline w/o abd pain, N/V/D/C/ or blood seen...    GU>  He saw DrTannenbaum et al 3/12 for f/u BPH, ED, & PSA recheck;  Voiding satis, given Cialis & PSA= 0.47 (he had 58cc PVR- not on meds at present)...    DJD/ LBP>  C/o LBP w/ walking, no radiation;  He declines further eval at this point, prefers exerc program, heat, aleve Prn...    Anxiety>  He related difficulty caring for family esp elderly mother who is quite demanding &  conflict w/ his daughter/ her 2 kids/ etc...  ~  May 25, 2012:  32mo ROV & Jason [name] has had a time w/ his right foot- great toe sore (ulcer under the big toe per pt) treated by DrTuckman, Podiatry but we don't have any notes from them & we will call for records (recall hx prev right 2nd toe amputation 2010 by Volney Presser for osteomyelitis);  His stress level is somewhat diminished w/ kids moved out & some quiet returned;  No new complaints per pt...    We reviewed prob list, meds, xrays and labs> see below for updates>> LABS 7/13:  Chems- ok x BS=116 A1c=7.1  ~  November 27, 2012:  32mo ROV & Jason [name] has a few aches & pains, requests refill of his Vicodin, otherw stable;  We reviewed the following medical problems during today's office visit >>     MVP>  On ASA daily & 2DEcho 10/11 showed mild LVH, norm LVF w/ EF= 55-60%, ant leaflet MVP seen w/ MR, mod LAdil; he denies CP, palpit, SOB, edema...    Ven Insuffic>  Hx VV, VI, stasis  changes w/ ulcer/ cellulitis in past; s/p laser to left GSV by DrEarly & s/p right 2nd toe amp for osteomyelitis by Volney Presser 2010.    Chol>  On Simva40 (taking 1/2 of the 80) and FLP shows TChol 136, TG 109, HDL 44, LDL 71... keep same dose + better diet & get wt down!    DM>  On Metformin 500Bid & Glimep 1mg Qam; BS at home are improved & Labs here show BS= 146, A1c= 7.2; he desperately needs to get the wt down...    Obesity>  weight is essentially unchanged at 326# today; we reviewed the critical need for wt reducting diet + exercise program...    GI>  Adm 9/12 w/ SBO & had ELap w/ lysis of adhesions;  Now back to baseline w/o abd pain, N/V/D/C/ or blood seen...    GU>  He saw DrTannenbaum et al 3/12 (we don't have more recent notes) for f/u BPH, ED, & PSA recheck;  Voiding satis, given Cialis & PSA= 0.47 (he had 58cc PVR- not on meds at present); PSA checks here have been stable at 0.38...    DJD/ LBP>  Hx LBP w/ walking, no radiation;  Hx right foot problems w/ amp 1st 3  toes now for osteomyelitis Lestine Box, Podiatrist, [place])...    Anxiety>  He related difficulty caring for family esp elderly mother who is quite demanding & conflict w/ his daughter/ her 2 kids/ etc... We reviewed prob list, meds, xrays and labs> see below for updates >> he had the 2015 Flu vaccine 10/13, up to date on others; requests refills... LABS 1/14:  FLP- at goals on Simva40;  Chems- ok x BS=146, A1c=7.2;  TSH=1.19, PSA=0.38          Problem List:  Hx of SINUSITIS (ICD-473.9)  Hx Pneumonia - seen in ER 7/09 w/ faint RLL infiltrate that cleared serially w/ antibiotic Rx...  MITRAL VALVE PROLAPSE (ICD-424.0) - on ASA 81mg /d... he denies CP, palpit, dyspnea... exam w/ MR murmur... ~  2DEcho 5/01 showed mild ant leaflet MVP, mild MR, norm LVF. ~  f/u 2DEcho 10/11 showed mild LVH, norm LVF w/ EF= 55-60%, ant leaflet MVP seen w/ MR, mod LAdil ~  EKG 9/12 showed NSR, rate80, NSSTTWA ~  7/13:  BP= 118/64 on diet Rx alone & reminded to elim sodium, get wt down... ~  1/14:  BP= 128/72 & he remains stoic & largely asymptomatic...  VENOUS INSUFFICIENCY (ICD-459.81) - he knows to elim salt, elevate legs, wear TED's... ~  neg ven dopplers in 1996...  ~  he saw DrLawson in 2000 for a ven stasis ulcer on his leg... rec for TED's, no salt, elevation, etc...  ~  he had normal ABI's in 10/06... ~  3/10:  left leg wound w/ cellulitis & +MRSA... Rx'd and refer back to wound care clinic. ~  7/10:  sched for right second toe amputation due to osteomyelitis despite hyperbaric O2 etc... ~  8/10: s/p right second toe amp for osteomyelitis Lestine Box), and s/p laser ablation of left greater saphenous vein (DrEarly). ~  7/13:  He tells me that DrTuckman, Podiatrist has been treating his right great toe (ulcer on plantar surface)- we will call for notes. ~  He subsequently had 2 more toes amputated by DrDuda; subseq well healed & doing satis...  HYPERCHOLESTEROLEMIA (ICD-272.0) - changed to SIMVASTATIN 80mg - 1/2  tab Qhs... ~  FLP 9/08 showed TChol 145, TG 54, HDL 46, LDL 88 ~  FLP 9/09 showed TChol 144,  Tg 65, HDL 48, LDL 83 ~  FLP 9/10 showed TChol 146, TG 52, HDL 51, LDL 85 ~  FLP 3/12 showed TChol 141, TG 55, HDL 50, LDL 81... keep same dose + better diet & get wt down! ~  FLP 1/13 on Simva40 showed TChol 134, TG 48, HDL 48, LDL 77... Continue same. ~  FLP 1/14 on Simva40 showed TChol 136, TG 109, HDL 44, LDL 71  DIABETES MELLITUS (ICD-250.00) - on METFORMIN 500mg  Bid + GLIMEPIRIDE 1mg /d, and diet efforts... ~  labs 9/08 showed BS=123, HgA1c=6.0.Marland Kitchen. urine microalb was neg... ~  labs 9/09 showed BS= 144, HgA1c= 6.1.Marland Kitchen. rec> same med, better diet. ~  labs 3/10 showed BS= 147, HgA1c= 6.9.Marland Kitchen. rec> incr the MetformBid, but he never did. ~  7/10: he states the wound care clinic increased his glucose intake due to Hyperbaric O2 Rx... we increased his Metform500 Bid. ~  labs 9/10 on Metform500Bid showed BS= 154, A1c= 6.4 ~  labs 3/11 on Metform500Bid showed BS= 192, A1c= 7.5.Marland Kitchen. rec> add Glimep 1mg /d. ~  labs 9/11 on Metform500Bid+Glim1 showed BS= 124, A1c= 5.8.Marland Kitchen. rec> decr Glimep1mg  to 1/2 tab daily (he never did). ~  Labs 3/12 on Metform500Bid+Glim1 showed BS= 114, A1c= 5.9.Marland KitchenMarland Kitchen Rec> decr Glim1mg - to 1/2 tab. ~  Labs in [facility] 9/12 SBO showed BS betw 78 - 164 ~  Labs 1/13 on Metform500Bid+Glim0.5 showed BS= 138, A1c= 6.2.Marland KitchenMarland Kitchen Rec> continue same, get wt down. ~  Labs 7/13 on Metform500Bid+Glim0.5 showed BS= 116, A1c= 7.1.Marland KitchenMarland Kitchen Not as good, same meds, better diet. ~  Labs 1/14 on Metform500Bid+Glim0.5 showed BS= 146, A1c= 7.2.Marland KitchenMarland Kitchen Rec to incr Glim1mg Qam...  OBESITY (ICD-278.00) - he is very large at 6\' 8"  tall and >300# for the last 10+ yrs... peak = 340#... he drinks beer daily... discussed diet + exercise program (and no beer)... ~  he had transient right gynecomastia 9/09, no discharge, neg mammogram & resolved on it's own... ~  weight 3/11 = 333# ~  weight 9/11 = 319# ~  Weight 3/12 = 327# ~  Weight 10/12 = 310#   post hosp for SBO... ~  Weight 1/13 = 329# ~  Weight 7/13 = 323# ~  Weight 1/14 = 326#  GERD (ICD-530.81) >> not currently taking a PPI med...  DIVERTICULOSIS OF COLON (ICD-562.10) / IRRITABLE BOWEL SYNDROME (ICD-564.1) & COLONIC POLYPS (ICD-211.3) ~  colonoscopy 3/06 by DrStark showing divertics & 2-82mm polyps (adenomas)... ~  colonoscopy 9/11 by DrStark showed divertics & 4mm polyp= tubular adenoma... f/u planned 38yrs.  BENIGN PROSTATIC HYPERTROPHY & ED - eval by DrTannenbaum 7/08 and Rx w/ Cialis; he has tried the PEP shots; + fam hx prostate ca in his father... ~  8/10:  DrTannenbaum did circ for phimosis, pt states that PSA check was OK... ~  3/12:  f/u BPH, ED, & PSA recheck;  Voiding satis, given Cialis & PSA= 0.47 (he had 58cc PVR- not on meds at present). ~  1/13:  Labs here showed PSA= 0.39 (copy to DrTannenbaum) ~  1/14:  Labs showed PSA= 0.38  DEGENERATIVE JOINT DISEASE (ICD-715.90) - he uses OTC meds Prn, and was rec to start Vit supplements by DrTannenbaum- takes MVI, VitC, VitD... he has seen DrHilts in the past... ~  He has had the 1st 3 toes of right foot amputated...  Hx of HIDRADENITIS (ICD-705.83)  ANXIETY>  He related difficulty caring for family esp elderly mother who is quite demanding & conflict w/ his daughter/ her 2 kids/ etc...  Health  Maintenance - up to date on colon & prostate screens... PNEUMOVAX 9/09... TETANUS booster 3/10... gets yearly Flu vaccines in Fall.   Past Surgical History  Procedure Date  . Knee arthroscopy   . Toe amputation 06/2009    Right foot second toe -Dr Lestine Box  . Laser ablation 06/2009    Left Greater Saphenous vein -Dr Arbie Cookey  . Circumcision 06/2100    For Phimosis - Dr Marcello Fennel  . Laparotomy 07/20/11  . Colon surgery   . Tonsillectomy   . Amputation 08/09/2012    Procedure: AMPUTATION DIGIT;  Surgeon: Nadara Mustard, MD;  Location: [facility] OR;  Service: Orthopedics;  Laterality: Right;  Amputation right Great Toe MTP Joint  .  Amputation 10/17/2012    Procedure: AMPUTATION DIGIT;  Surgeon: Nadara Mustard, MD;  Location: [facility] OR;  Service: Orthopedics;  Laterality: Right;  Right Foot 3rd Toe Amputation at Metatarsophalangeal Joint    Outpatient Encounter Prescriptions as of 11/27/2012  Medication Sig Dispense Refill  . aspirin EC 81 MG tablet Take 81 mg by mouth daily.      Marland Kitchen glimepiride (AMARYL) 2 MG tablet Take 2 mg by mouth daily before breakfast.      . metFORMIN (GLUCOPHAGE) 500 MG tablet Take 500 mg by mouth 2 (two) times daily with a meal.      . simvastatin (ZOCOR) 40 MG tablet Take 40 mg by mouth every evening.      . vitamin C (ASCORBIC ACID) 500 MG tablet Take 500 mg by mouth daily.      . [DISCONTINUED] doxycycline (VIBRA-TABS) 100 MG tablet Take 100 mg by mouth 2 (two) times daily.      . [DISCONTINUED] HYDROcodone-acetaminophen (VICODIN) 5-500 MG per tablet Take 1 tablet by mouth every 6 (six) hours as needed for pain.  60 tablet  0  . [DISCONTINUED] HYDROcodone-acetaminophen (VICODIN) 5-500 MG per tablet Take 1 tablet by mouth every 6 (six) hours as needed for pain.  30 tablet  0    No Known Allergies   Current Medications, Allergies, Past Medical History, Past Surgical History, Family History, and Social History were reviewed in [employer] record.    Review of Systems         See HPI - all other systems neg except as noted...  The patient denies anorexia, fever, weight loss, weight gain, vision loss, decreased hearing, hoarseness, chest pain, syncope, dyspnea on exertion, peripheral edema, prolonged cough, headaches, hemoptysis, abdominal pain, melena, hematochezia, severe indigestion/heartburn, hematuria, incontinence, muscle weakness, suspicious skin lesions, transient blindness, difficulty walking, depression, unusual weight change, abnormal bleeding, enlarged lymph nodes, and angioedema.     Objective:   Physical Exam     WD, Overweight, very large 72 y/o BM in  NAD... GENERAL:  Alert & oriented; pleasant & cooperative. HEENT:  [place]/AT, EOM-wnl, PERRLA, EACs-clear, TMs-wnl, NOSE-clear, THROAT-clear & wnl. NECK:  Supple w/ fairROM; no JVD; normal carotid impulses w/o bruits; no thyromegaly or nodules palpated; no lymphadenopathy. CHEST:  Clear to P & A; without wheezes/ rales/ or rhonchi heard... HEART:  Regular Rhythm; gr 1/6 SEM without rubs or gallops detected... ABDOMEN:  Soft & nontender; normal bowel sounds; no organomegaly or masses detected. EXT: without deformities, mod arthritic changes; wearing TEDs +varicose veins/ +venous insuffic/ chr 1+ edema... NEURO:  CN's intact;  no focal neuro deficits... Derm:  Mild stasis changes in LEs w/o cellulitis etc...  RADIOLOGY DATA:  Reviewed in the EPIC EMR & discussed w/ the patient...  LABORATORY DATA:  Reviewed in the [facility] EMR & discussed w/ the patient...   Assessment & Plan:    Hx Sinusitis/ Hx Pneumonia>  During the 9/12 hosp for SBO he developed an ileus & pleural effusion which resolved serially...  MVP>  Mild MVP on 2DEcho & he denies CP, palpit, ch in SOB, edmea, etc...  Ven Insuffic>  He had vein surg from DrEarly; knows to elim sodium, elevate legs, wear support hose, etc...  Chol>  On Simva40 + diet;  FLP looks good...  DM>  Controlled on Metformin & Glimepiride; he needs better diet, incr exercise, get wt down...    Diabetic foot ulcer> he lost right 2nd toe to osteomyelitis in 2010; now w/ ulcer on plantar aspect great toe> under the care of Podiatry=> DrDuda had to amputate 2 toes 9/13...  Obesity>  As noted, wt down to 310# after his hosp for SBO; then right back up to 329# after the holidays...  GI> s/p SBO w/ surg, Hx GERD/ Divertics/ IBS/ Polyps> GI per DrStark & post-op DrBlackman...  GU> BPH & ED followed by DrTannenbaum...  DJD> on OTC analgesics as needed...  ANXIETY>  We discussed coping strategies for the family stress...   Patient's Medications  New  Prescriptions   GLIMEPIRIDE (AMARYL) 1 MG TABLET    Take 1 tablet (1 mg total) by mouth daily before breakfast.  Previous Medications   ASPIRIN EC 81 MG TABLET    Take 81 mg by mouth daily.   METFORMIN (GLUCOPHAGE) 500 MG TABLET    Take 500 mg by mouth 2 (two) times daily with a meal.   SIMVASTATIN (ZOCOR) 40 MG TABLET    Take 40 mg by mouth every evening.   VITAMIN C (ASCORBIC ACID) 500 MG TABLET    Take 500 mg by mouth daily.  Modified Medications   No medications on file  Discontinued Medications   DOXYCYCLINE (VIBRA-TABS) 100 MG TABLET    Take 100 mg by mouth 2 (two) times daily.   GLIMEPIRIDE (AMARYL) 2 MG TABLET    Take 2 mg by mouth daily before breakfast.   HYDROCODONE-ACETAMINOPHEN (VICODIN) 5-500 MG PER TABLET    Take 1 tablet by mouth every 6 (six) hours as needed for pain.   HYDROCODONE-ACETAMINOPHEN (VICODIN) 5-500 MG PER TABLET    Take 1 tablet by mouth every 6 (six) hours as needed for pain.

## 2012-11-27 NOTE — Patient Instructions (Addendum)
Today we updated your med list in our EPIC system...    Continue your current medications the same...  Please return to our lab one morning soon for your FASTING labs...    We will contact you w/ the results when avail...  Call for any problems...  Let's continue our 6 month f/u visits.Marland KitchenMarland Kitchen

## 2012-11-29 ENCOUNTER — Other Ambulatory Visit (INDEPENDENT_AMBULATORY_CARE_PROVIDER_SITE_OTHER): Payer: Medicare Other

## 2012-11-29 DIAGNOSIS — Z87898 Personal history of other specified conditions: Secondary | ICD-10-CM

## 2012-11-29 DIAGNOSIS — E119 Type 2 diabetes mellitus without complications: Secondary | ICD-10-CM

## 2012-11-29 DIAGNOSIS — E669 Obesity, unspecified: Secondary | ICD-10-CM

## 2012-11-29 DIAGNOSIS — I059 Rheumatic mitral valve disease, unspecified: Secondary | ICD-10-CM

## 2012-11-29 DIAGNOSIS — E7800 Pure hypercholesterolemia, unspecified: Secondary | ICD-10-CM

## 2012-11-29 LAB — BASIC METABOLIC PANEL
CO2: 30 mEq/L (ref 19–32)
Chloride: 101 mEq/L (ref 96–112)
Glucose, Bld: 146 mg/dL — ABNORMAL HIGH (ref 70–99)
Potassium: 4.2 mEq/L (ref 3.5–5.1)
Sodium: 137 mEq/L (ref 135–145)

## 2012-11-29 LAB — HEMOGLOBIN A1C: Hgb A1c MFr Bld: 7.2 % — ABNORMAL HIGH (ref 4.6–6.5)

## 2012-11-29 LAB — PSA: PSA: 0.38 ng/mL (ref 0.10–4.00)

## 2012-11-29 LAB — LIPID PANEL: Total CHOL/HDL Ratio: 3

## 2012-12-14 ENCOUNTER — Telehealth: Payer: Self-pay | Admitting: Pulmonary Disease

## 2012-12-14 MED ORDER — GLIMEPIRIDE 1 MG PO TABS: 1.0000 mg | ORAL_TABLET | Freq: Every day | ORAL | Status: DC

## 2012-12-14 NOTE — Telephone Encounter (Signed)
I spoke with pt and is aware of SN recs. He voiced his understanding. Rx has been sent to the pharmacy. Nothing further was needed

## 2012-12-14 NOTE — Telephone Encounter (Signed)
I spoke with pt. He stated he is wanting to change his glimepiride back to 1 tablet daily. According to EMR he is suppose to be taking glimepiride 2 mg daily. Per pt he has not been doing so. He has been taking glimepiride 1 mg 1/2 tablet daily. He reports he has been taking it like this for some time now.   2nd issue He is wanting to know if SN would take on his mother as a new pt. He stated SN used to see her a while a ago the her isurance changed so that's why she had to change PCP's. If not then who would SN rec she see. Please advise thanks Last OV 11/27/12 05/28/13

## 2012-12-14 NOTE — Telephone Encounter (Signed)
Per SN---  1.  Ok to change the rx to the glimepiride 1 mg 1 daily and this can be sent in to the pharmacy.    2.  Not able to take on his mother as a new pt.  We are overbooked.  Thanks

## 2013-03-12 ENCOUNTER — Other Ambulatory Visit: Payer: Self-pay | Admitting: Pulmonary Disease

## 2013-05-28 ENCOUNTER — Ambulatory Visit: Payer: Medicare Other | Admitting: Pulmonary Disease

## 2013-06-27 ENCOUNTER — Ambulatory Visit: Payer: Medicare Other | Admitting: Pulmonary Disease

## 2013-07-10 ENCOUNTER — Other Ambulatory Visit (INDEPENDENT_AMBULATORY_CARE_PROVIDER_SITE_OTHER): Payer: Medicare Other

## 2013-07-10 ENCOUNTER — Ambulatory Visit (INDEPENDENT_AMBULATORY_CARE_PROVIDER_SITE_OTHER): Payer: Medicare Other | Admitting: Pulmonary Disease

## 2013-07-10 ENCOUNTER — Encounter: Payer: Self-pay | Admitting: Pulmonary Disease

## 2013-07-10 VITALS — BP 122/60 | HR 65 | Temp 98.1°F | Ht >= 80 in | Wt 314.4 lb

## 2013-07-10 DIAGNOSIS — D126 Benign neoplasm of colon, unspecified: Secondary | ICD-10-CM

## 2013-07-10 DIAGNOSIS — I059 Rheumatic mitral valve disease, unspecified: Secondary | ICD-10-CM

## 2013-07-10 DIAGNOSIS — E7800 Pure hypercholesterolemia, unspecified: Secondary | ICD-10-CM

## 2013-07-10 DIAGNOSIS — I872 Venous insufficiency (chronic) (peripheral): Secondary | ICD-10-CM

## 2013-07-10 DIAGNOSIS — M1990 Unspecified osteoarthritis, unspecified site: Secondary | ICD-10-CM

## 2013-07-10 DIAGNOSIS — E669 Obesity, unspecified: Secondary | ICD-10-CM

## 2013-07-10 DIAGNOSIS — E119 Type 2 diabetes mellitus without complications: Secondary | ICD-10-CM

## 2013-07-10 DIAGNOSIS — K5730 Diverticulosis of large intestine without perforation or abscess without bleeding: Secondary | ICD-10-CM

## 2013-07-10 DIAGNOSIS — K219 Gastro-esophageal reflux disease without esophagitis: Secondary | ICD-10-CM

## 2013-07-10 DIAGNOSIS — J45909 Unspecified asthma, uncomplicated: Secondary | ICD-10-CM

## 2013-07-10 DIAGNOSIS — K589 Irritable bowel syndrome without diarrhea: Secondary | ICD-10-CM

## 2013-07-10 LAB — BASIC METABOLIC PANEL
BUN: 15 mg/dL (ref 6–23)
Calcium: 9.3 mg/dL (ref 8.4–10.5)
Chloride: 103 mEq/L (ref 96–112)
GFR: 65.26 mL/min (ref >60.00)
Glucose, Bld: 74 mg/dL (ref 70–99)
Sodium: 138 mEq/L (ref 135–145)

## 2013-07-10 NOTE — Progress Notes (Signed)
Subjective:    Patient ID: Jason [name], male    DOB: [birth year], 72 y.o.   MRN: [id number]  HPI 72 y/o BM here for a follow up visit... he has mult med problems as listed below>>  ~  August 30, 2011:  14mo ROV & post HOSP visit> Adm 9/12 for SBO & required ELap w/ lysis of adhesions by DrBlackman, [facility]; post-op problems included ileus, cough, pleural effusion, & minor wound complic managed as outpt & now back to baseline...  ~  November 25, 2011:  31mo ROV & he reports stable, just ate too much over the holidays and his wt is up 19# to 329# today; we reviewed diet, exercise & wt reduction strategies "I'm going to work on it"... He is not fasting today & will ret next week for full fasting labs>>    MVP>  On ASA daily & 2DEcho 10/11 showed mild LVH, norm LVF w/ EF= 55-60%, ant leaflet MVP seen w/ MR, mod LAdil; he denies CP, palpit, SOB, edema...    Ven Insuffic>  Hx VV, VI, stasis changes w/ ulcer/ cellulitis in past; s/p laser to left GSV by DrEarly & s/p right 2nd toe amp for osteomyelitis by Volney Presser 2010.    Chol>  On Simva40 (taking 1/2 of the 80) and last FLP 3/12 showed TChol 141, TG 55, HDL 50, LDL 81... keep same dose + better diet & get wt down!    DM>  On Metformin 500Bid & Glimep 1mg - 1/2Qam; BS at home are sl up w/ wt gain per pt; f/u BS/ A1c ==> pending.    Obesity>  weight is back up 19# to 329# today; we reviewed the critical need for wt reducting diet + exercise program...    GI>  Adm 9/12 w/ SBO & had ELap w/ lysis of adhesions;  Now improved & back to baseline w/o abd pain, N/V/D/C/ or blood seen...    GU>  He saw DrTannenbaum et al 3/12 for f/u BPH, ED, & PSA recheck;  Voiding satis, given Cialis & PSA= 0.47 (he had 58cc PVR- not on meds at present)...    DJD/ LBP>  C/o LBP w/ walking, no radiation;  He declines further eval at this point, prefers exerc program, heat, aleve Prn...    Anxiety>  He related difficulty caring for family esp elderly mother who is quite demanding &  conflict w/ his daughter/ her 2 kids/ etc...  ~  May 25, 2012:  14mo ROV & Jason [name] has had a time w/ his right foot- great toe sore (ulcer under the big toe per pt) treated by DrTuckman, Podiatry but we don't have any notes from them & we will call for records (recall hx prev right 2nd toe amputation 2010 by Volney Presser for osteomyelitis);  His stress level is somewhat diminished w/ kids moved out & some quiet returned;  No new complaints per pt...    We reviewed prob list, meds, xrays and labs> see below for updates>> LABS 7/13:  Chems- ok x BS=116 A1c=7.1  ~  November 27, 2012:  14mo ROV & Jason [name] has a few aches & pains, requests refill of his Vicodin, otherw stable;  We reviewed the following medical problems during today's office visit >>     MVP>  On ASA daily & 2DEcho 10/11 showed mild LVH, norm LVF w/ EF= 55-60%, ant leaflet MVP seen w/ MR, mod LAdil; he denies CP, palpit, SOB, edema...    Ven Insuffic>  Hx VV, VI, stasis  changes w/ ulcer/ cellulitis in past; s/p laser to left GSV by DrEarly & s/p right 2nd toe amp for osteomyelitis by Volney Presser 2010.    Chol>  On Simva40 (taking 1/2 of the 80) and FLP shows TChol 136, TG 109, HDL 44, LDL 71... keep same dose + better diet & get wt down!    DM>  On Metformin 500Bid & Glimep 1mg Qam; BS at home are improved & Labs here show BS= 146, A1c= 7.2; he desperately needs to get the wt down...    Obesity>  weight is essentially unchanged at 326# today; we reviewed the critical need for wt reducting diet + exercise program...    GI>  Adm 9/12 w/ SBO & had ELap w/ lysis of adhesions;  Now back to baseline w/o abd pain, N/V/D/C/ or blood seen...    GU>  He saw DrTannenbaum et al 3/12 (we don't have more recent notes) for f/u BPH, ED, & PSA recheck;  Voiding satis, given Cialis & PSA= 0.47 (he had 58cc PVR- not on meds at present); PSA checks here have been stable at 0.38...    DJD/ LBP>  Hx LBP w/ walking, no radiation;  Hx right foot problems w/ amp 1st 3  toes now for osteomyelitis Lestine Box, Podiatrist, [place])...    Anxiety>  He related difficulty caring for family esp elderly mother who is quite demanding & conflict w/ his daughter/ her 2 kids/ etc... We reviewed prob list, meds, xrays and labs> see below for updates >> he had the 2015 Flu vaccine 10/13, up to date on others; requests refills... LABS 1/14:  FLP- at goals on Simva40;  Chems- ok x BS=146, A1c=7.2;  TSH=1.19, PSA=0.38   ~  July 10, 2013:  27mo ROV & Jason [name] has a rash on his arm- pruritic, was working in his yard, & c/w mild poison ivy- improved w/ Calamine & OTC meds... We reviewed the following medical problems during today's office visit >>`    MVP>  On ASA daily & 2DEcho 10/11 showed mild LVH, norm LVF w/ EF= 55-60%, ant leaflet MVP seen w/ MR, mod LAdil; he denies CP, palpit, SOB, edema...    Ven Insuffic>  Hx VV, VI, stasis changes w/ ulcer/ cellulitis in past; s/p laser to left GSV by DrEarly & s/p right 2nd toe amp for osteomyelitis by Volney Presser 2010.    Chol>  On Simva40 and FLP 1/14 shows TChol 136, TG 109, HDL 44, LDL 71... keep same dose + better diet & get wt down!    DM>  On Metformin 500Bid & Glimep1mg Qam; BS at home are improved & Labs here 8/14 showed BS= 74, A1c= 6.4; encouraged to lose wt...    Obesity>  weight is down to 314# today; we reviewed the critical need for wt reducting diet + exercise program...    GI>  Adm 9/12 w/ SBO & had ELap w/ lysis of adhesions;  Now back to baseline w/o abd pain, N/V/D/C/ or blood seen...    GU>  He saw DrTannenbaum et al 3/12 (we don't have more recent notes) for f/u BPH, ED, & PSA recheck;  Voiding satis, given Cialis & PSA= 0.47 (he had 58cc PVR- not on meds at present); PSA checks here have been stable at 0.38...    DJD/ LBP>  Hx LBP w/ walking, no radiation;  Hx right foot problems w/ amp 1st 3 toes now for osteomyelitis Lestine Box, Podiatrist, [place])...    Anxiety>  He related difficulty caring for family esp  elderly mother who is  quite demanding & conflict w/ his daughter/ her 2 kids/ etc... We reviewed prob list, meds, xrays and labs> see below for updates >>  ~  LABS 8/14:  FLP- ok w/ BS=74, A1c=6.4, Cr=1.4.Marland KitchenMarland Kitchen            Problem List:  Hx of SINUSITIS (ICD-473.9)  Hx Pneumonia - seen in ER 7/09 w/ faint RLL infiltrate that cleared serially w/ antibiotic Rx... ~  CXR 9/13 showed norm heart size, clear lungs w/ mild left basilar scarring 7 mild degen changes in Tspine...  MITRAL VALVE PROLAPSE (ICD-424.0) - on ASA 81mg /d... he denies CP, palpit, dyspnea... exam w/ MR murmur... ~  2DEcho 5/01 showed mild ant leaflet MVP, mild MR, norm LVF. ~  f/u 2DEcho 10/11 showed mild LVH, norm LVF w/ EF= 55-60%, ant leaflet MVP seen w/ MR, mod LAdil ~  EKG 9/12 showed NSR, rate80, NSSTTWA ~  7/13:  BP= 118/64 on diet Rx alone & reminded to elim sodium, get wt down... ~  EKG 9/13 showed NSR, rate 79, NSSTTWA... ~  1/14:  BP= 128/72 & he remains stoic & largely asymptomatic... ~  8/14:  BP= 122/60 & he denies CP, palpit, SOB, etc...  VENOUS INSUFFICIENCY (ICD-459.81) - he knows to elim salt, elevate legs, wear TED's... ~  neg ven dopplers in 1996...  ~  he saw DrLawson in 2000 for a ven stasis ulcer on his leg... rec for TED's, no salt, elevation, etc...  ~  he had normal ABI's in 10/06... ~  3/10:  left leg wound w/ cellulitis & +MRSA... Rx'd and refer back to wound care clinic. ~  7/10:  sched for right second toe amputation due to osteomyelitis despite hyperbaric O2 etc... ~  8/10: s/p right second toe amp for osteomyelitis Lestine Box), and s/p laser ablation of left greater saphenous vein (DrEarly). ~  7/13:  He tells me that DrTuckman, Podiatrist has been treating his right great toe (ulcer on plantar surface)- we will call for notes. ~  He subsequently had 2 more toes amputated by DrDuda; subseq well healed & doing satis...  HYPERCHOLESTEROLEMIA (ICD-272.0) - changed to SIMVASTATIN 40mg  Qhs... ~  FLP 9/08 showed TChol  145, TG 54, HDL 46, LDL 88 ~  FLP 9/09 showed TChol 144, Tg 65, HDL 48, LDL 83 ~  FLP 9/10 showed TChol 146, TG 52, HDL 51, LDL 85 ~  FLP 3/12 showed TChol 141, TG 55, HDL 50, LDL 81... keep same dose + better diet & get wt down! ~  FLP 1/13 on Simva40 showed TChol 134, TG 48, HDL 48, LDL 77... Continue same. ~  FLP 1/14 on Simva40 showed TChol 136, TG 109, HDL 44, LDL 71  DIABETES MELLITUS (ICD-250.00) - on METFORMIN 500mg  Bid + GLIMEPIRIDE 1mg /d, and diet efforts... ~  labs 9/08 showed BS=123, HgA1c=6.0.Marland Kitchen. urine microalb was neg... ~  labs 9/09 showed BS= 144, HgA1c= 6.1.Marland Kitchen. rec> same med, better diet. ~  labs 3/10 showed BS= 147, HgA1c= 6.9.Marland Kitchen. rec> incr the MetformBid, but he never did. ~  7/10: he states the wound care clinic increased his glucose intake due to Hyperbaric O2 Rx... we increased his Metform500 Bid. ~  labs 9/10 on Metform500Bid showed BS= 154, A1c= 6.4 ~  labs 3/11 on Metform500Bid showed BS= 192, A1c= 7.5.Marland Kitchen. rec> add Glimep 1mg /d. ~  labs 9/11 on Metform500Bid+Glim1 showed BS= 124, A1c= 5.8.Marland Kitchen. rec> decr Glimep1mg  to 1/2 tab daily (he never did). ~  Labs 3/12 on  Metform500Bid+Glim1 showed BS= 114, A1c= 5.9.Marland KitchenMarland Kitchen Rec> decr Glim1mg - to 1/2 tab. ~  Labs in [facility] 9/12 SBO showed BS betw 78 - 164 ~  Labs 1/13 on Metform500Bid+Glim0.5 showed BS= 138, A1c= 6.2.Marland KitchenMarland Kitchen Rec> continue same, get wt down. ~  Labs 7/13 on Metform500Bid+Glim0.5 showed BS= 116, A1c= 7.1.Marland KitchenMarland Kitchen Not as good, same meds, better diet. ~  Labs 1/14 on Metform500Bid+Glim0.5 showed BS= 146, A1c= 7.2.Marland KitchenMarland Kitchen Rec to incr Glim1mg Qam. ~  Labs 8/14 on Metform500Bid+Glim1 showed BS= 74, A1c= 6.4.Marland KitchenMarland Kitchen Continue same.  OBESITY (ICD-278.00) - he is very large at 6\' 8"  tall and >300# for the last 10+ yrs... peak = 340#... he drinks beer daily... discussed diet + exercise program (and no beer)... ~  he had transient right gynecomastia 9/09, no discharge, neg mammogram & resolved on it's own... ~  weight 3/11 = 333# ~  weight 9/11 = 319# ~   Weight 3/12 = 327# ~  Weight 10/12 = 310#  post hosp for SBO... ~  Weight 1/13 = 329# ~  Weight 7/13 = 323# ~  Weight 1/14 = 326# ~  Weight 8/14 = 314#  GERD (ICD-530.81) >> not currently taking a PPI med...  DIVERTICULOSIS OF COLON (ICD-562.10) / IRRITABLE BOWEL SYNDROME (ICD-564.1) & COLONIC POLYPS (ICD-211.3) ~  colonoscopy 3/06 by DrStark showing divertics & 2-34mm polyps (adenomas)... ~  colonoscopy 9/11 by DrStark showed divertics & 4mm polyp= tubular adenoma... f/u planned 83yrs.  BENIGN PROSTATIC HYPERTROPHY & ED - eval by DrTannenbaum 7/08 and Rx w/ Cialis; he has tried the PEP shots; + fam hx prostate ca in his father... ~  8/10:  DrTannenbaum did circ for phimosis, pt states that PSA check was OK... ~  3/12:  f/u BPH, ED, & PSA recheck;  Voiding satis, given Cialis & PSA= 0.47 (he had 58cc PVR- not on meds at present). ~  1/13:  Labs here showed PSA= 0.39 (copy to DrTannenbaum) ~  1/14:  Labs showed PSA= 0.38  DEGENERATIVE JOINT DISEASE (ICD-715.90) - he uses OTC meds Prn, and was rec to start Vit supplements by DrTannenbaum- takes MVI, VitC, VitD... he has seen DrHilts in the past... ~  He has had the 1st 3 toes of right foot amputated...  Hx of HIDRADENITIS (ICD-705.83)  ANXIETY>  He related difficulty caring for family esp elderly mother who is quite demanding & conflict w/ his daughter/ her 2 kids/ etc...  Health Maintenance - up to date on colon & prostate screens... PNEUMOVAX 9/09... TETANUS booster 3/10... gets yearly Flu vaccines in Fall.   Past Surgical History  Procedure Laterality Date  . Knee arthroscopy    . Toe amputation  06/2009    Right foot second toe -Dr Lestine Box  . Laser ablation  06/2009    Left Greater Saphenous vein -Dr Arbie Cookey  . Circumcision  06/2100    For Phimosis - Dr Marcello Fennel  . Laparotomy  07/20/11  . Colon surgery    . Tonsillectomy    . Amputation  08/09/2012    Procedure: AMPUTATION DIGIT;  Surgeon: Nadara Mustard, MD;  Location: [facility] OR;   Service: Orthopedics;  Laterality: Right;  Amputation right Great Toe MTP Joint  . Amputation  10/17/2012    Procedure: AMPUTATION DIGIT;  Surgeon: Nadara Mustard, MD;  Location: [facility] OR;  Service: Orthopedics;  Laterality: Right;  Right Foot 3rd Toe Amputation at Metatarsophalangeal Joint    Outpatient Encounter Prescriptions as of 07/10/2013  Medication Sig Dispense Refill  . aspirin EC 81 MG tablet  Take 81 mg by mouth daily.      Marland Kitchen glimepiride (AMARYL) 1 MG tablet Take 1 tablet (1 mg total) by mouth daily before breakfast.  90 tablet  3  . metFORMIN (GLUCOPHAGE) 500 MG tablet Take 1 tablet by mouth  twice a day with meals  180 tablet  3  . simvastatin (ZOCOR) 40 MG tablet Take 1 tablet by mouth  every evening  90 tablet  3  . vitamin C (ASCORBIC ACID) 500 MG tablet Take 500 mg by mouth daily.       No facility-administered encounter medications on file as of 07/10/2013.    No Known Allergies   Current Medications, Allergies, Past Medical History, Past Surgical History, Family History, and Social History were reviewed in [employer] record.    Review of Systems         See HPI - all other systems neg except as noted...  The patient denies anorexia, fever, weight loss, weight gain, vision loss, decreased hearing, hoarseness, chest pain, syncope, dyspnea on exertion, peripheral edema, prolonged cough, headaches, hemoptysis, abdominal pain, melena, hematochezia, severe indigestion/heartburn, hematuria, incontinence, muscle weakness, suspicious skin lesions, transient blindness, difficulty walking, depression, unusual weight change, abnormal bleeding, enlarged lymph nodes, and angioedema.     Objective:   Physical Exam     WD, Overweight, very large 72 y/o BM in NAD... GENERAL:  Alert & oriented; pleasant & cooperative. HEENT:  [place]/AT, EOM-wnl, PERRLA, EACs-clear, TMs-wnl, NOSE-clear, THROAT-clear & wnl. NECK:  Supple w/ fairROM; no JVD; normal carotid impulses w/o  bruits; no thyromegaly or nodules palpated; no lymphadenopathy. CHEST:  Clear to P & A; without wheezes/ rales/ or rhonchi heard... HEART:  Regular Rhythm; gr 1/6 SEM without rubs or gallops detected... ABDOMEN:  Soft & nontender; normal bowel sounds; no organomegaly or masses detected. EXT: without deformities, mod arthritic changes; wearing TEDs +varicose veins/ +venous insuffic/ chr 1+ edema... NEURO:  CN's intact;  no focal neuro deficits... Derm:  Mild stasis changes in LEs w/o cellulitis etc...  RADIOLOGY DATA:  Reviewed in the EPIC EMR & discussed w/ the patient...  LABORATORY DATA:  Reviewed in the EPIC EMR & discussed w/ the patient...   Assessment & Plan:    Hx Sinusitis/ Hx Pneumonia>  During the 9/12 hosp for SBO he developed an ileus & pleural effusion which resolved serially...  MVP>  Mild MVP on 2DEcho & he denies CP, palpit, ch in SOB, edmea, etc...  Ven Insuffic>  He had vein surg from DrEarly; knows to elim sodium, elevate legs, wear support hose, etc...  Chol>  On Simva40 + diet;  FLP looks good...  DM>  Controlled on Metformin & Glimepiride; he needs better diet, incr exercise, get wt down...    Diabetic foot ulcer> he lost right 2nd toe to osteomyelitis in 2010; now w/ ulcer on plantar aspect great toe> under the care of Podiatry=> DrDuda had to amputate 2 toes 9/13...  Obesity>  As noted, wt down to 310# after his hosp for SBO; then right back up to 329# after the holidays...  GI> s/p SBO w/ surg, Hx GERD/ Divertics/ IBS/ Polyps> GI per DrStark & post-op DrBlackman...  GU> BPH & ED followed by DrTannenbaum...  DJD> on OTC analgesics as needed...  ANXIETY>  We discussed coping strategies for the family stress...   Patient's Medications  New Prescriptions   No medications on file  Previous Medications   ASPIRIN EC 81 MG TABLET    Take 81 mg  by mouth daily.   METFORMIN (GLUCOPHAGE) 500 MG TABLET    Take 1 tablet by mouth  twice a day with meals    SIMVASTATIN (ZOCOR) 40 MG TABLET    Take 1 tablet by mouth  every evening   VITAMIN C (ASCORBIC ACID) 500 MG TABLET    Take 500 mg by mouth daily.  Modified Medications   No medications on file  Discontinued Medications   GLIMEPIRIDE (AMARYL) 1 MG TABLET    Take 1 tablet (1 mg total) by mouth daily before breakfast.

## 2013-07-10 NOTE — Patient Instructions (Addendum)
Today we updated your med list in our EPIC system...    Continue your current medications the same...  Today we checked your DM labs...    We will contact you w/ the results when available...   Keep up the good work w/ diet, exercise, wt reduction...  Call for any questions...  Let's plan a follow up visit in 18mo (with FASTING blood work at that time), sooner if needed for problems.Marland KitchenMarland Kitchen

## 2013-07-15 ENCOUNTER — Encounter: Payer: Self-pay | Admitting: Pulmonary Disease

## 2013-08-20 ENCOUNTER — Telehealth: Payer: Self-pay | Admitting: Pulmonary Disease

## 2013-08-20 ENCOUNTER — Ambulatory Visit (INDEPENDENT_AMBULATORY_CARE_PROVIDER_SITE_OTHER): Payer: Medicare Other

## 2013-08-20 DIAGNOSIS — Z23 Encounter for immunization: Secondary | ICD-10-CM

## 2013-08-20 NOTE — Telephone Encounter (Signed)
I cal[facility] care pharmacy and advised to refax this to [phone]. She will do so and nothing further needed

## 2013-08-21 ENCOUNTER — Ambulatory Visit: Payer: Medicare Other

## 2013-08-21 ENCOUNTER — Telehealth: Payer: Self-pay | Admitting: Pulmonary Disease

## 2013-08-21 NOTE — Telephone Encounter (Signed)
Leigh please advise if this was received thanks

## 2013-08-22 NOTE — Telephone Encounter (Signed)
Called and lmomtcb for the pt to see if he is wanting to get his DM supplies through [facility].  Pt did want to use this pharmacy and i will fax the form back. Nothing further is needed.

## 2013-12-13 ENCOUNTER — Telehealth: Payer: Self-pay | Admitting: Pulmonary Disease

## 2013-12-13 ENCOUNTER — Other Ambulatory Visit: Payer: Self-pay | Admitting: Pulmonary Disease

## 2013-12-13 MED ORDER — BD SWAB SINGLE USE REGULAR PADS: MEDICATED_PAD | Status: DC

## 2013-12-13 MED ORDER — SIMVASTATIN 40 MG PO TABS: ORAL_TABLET | ORAL | Status: DC

## 2013-12-13 MED ORDER — METFORMIN HCL 500 MG PO TABS: ORAL_TABLET | ORAL | Status: DC

## 2013-12-13 MED ORDER — PRODIGY AUTOCODE BLOOD GLUCOSE DEVI: Status: DC

## 2013-12-13 NOTE — Telephone Encounter (Signed)
I called and made pt aware we do not have any samples at this time. Nothing further needed

## 2014-01-09 ENCOUNTER — Ambulatory Visit: Payer: Medicare Other | Admitting: Pulmonary Disease

## 2014-02-18 ENCOUNTER — Ambulatory Visit (INDEPENDENT_AMBULATORY_CARE_PROVIDER_SITE_OTHER): Payer: Medicare HMO | Admitting: Internal Medicine

## 2014-02-18 ENCOUNTER — Other Ambulatory Visit (INDEPENDENT_AMBULATORY_CARE_PROVIDER_SITE_OTHER): Payer: Commercial Managed Care - HMO

## 2014-02-18 ENCOUNTER — Ambulatory Visit: Payer: Medicare Other | Admitting: Pulmonary Disease

## 2014-02-18 ENCOUNTER — Encounter: Payer: Self-pay | Admitting: Internal Medicine

## 2014-02-18 VITALS — BP 118/64 | HR 74 | Temp 98.5°F | Resp 16 | Ht >= 80 in | Wt 311.2 lb

## 2014-02-18 DIAGNOSIS — Z23 Encounter for immunization: Secondary | ICD-10-CM

## 2014-02-18 DIAGNOSIS — E7800 Pure hypercholesterolemia, unspecified: Secondary | ICD-10-CM

## 2014-02-18 DIAGNOSIS — E1165 Type 2 diabetes mellitus with hyperglycemia: Principal | ICD-10-CM

## 2014-02-18 DIAGNOSIS — IMO0001 Reserved for inherently not codable concepts without codable children: Secondary | ICD-10-CM

## 2014-02-18 DIAGNOSIS — N400 Enlarged prostate without lower urinary tract symptoms: Secondary | ICD-10-CM | POA: Insufficient documentation

## 2014-02-18 DIAGNOSIS — Z0000 Encounter for general adult medical examination without abnormal findings: Secondary | ICD-10-CM | POA: Insufficient documentation

## 2014-02-18 DIAGNOSIS — F17200 Nicotine dependence, unspecified, uncomplicated: Secondary | ICD-10-CM

## 2014-02-18 DIAGNOSIS — I739 Peripheral vascular disease, unspecified: Secondary | ICD-10-CM | POA: Insufficient documentation

## 2014-02-18 DIAGNOSIS — Z720 Tobacco use: Secondary | ICD-10-CM

## 2014-02-18 LAB — FECAL OCCULT BLOOD, GUAIAC: Fecal Occult Blood: NEGATIVE

## 2014-02-18 LAB — CBC WITH DIFFERENTIAL/PLATELET
Basophils Absolute: 0 10*3/uL (ref 0.0–0.1)
Basophils Relative: 0.6 % (ref 0.0–3.0)
EOS PCT: 8.6 % — AB (ref 0.0–5.0)
Eosinophils Absolute: 0.4 10*3/uL (ref 0.0–0.7)
HCT: 39.8 % (ref 39.0–52.0)
HEMOGLOBIN: 13.5 g/dL (ref 13.0–17.0)
LYMPHS PCT: 28.4 % (ref 12.0–46.0)
Lymphs Abs: 1.3 10*3/uL (ref 0.7–4.0)
MCHC: 33.8 g/dL (ref 30.0–36.0)
MCV: 94.5 fl (ref 78.0–100.0)
MONOS PCT: 8.7 % (ref 3.0–12.0)
Monocytes Absolute: 0.4 10*3/uL (ref 0.1–1.0)
NEUTROS ABS: 2.4 10*3/uL (ref 1.4–7.7)
NEUTROS PCT: 53.7 % (ref 43.0–77.0)
Platelets: 169 10*3/uL (ref 150.0–400.0)
RBC: 4.22 Mil/uL (ref 4.22–5.81)
RDW: 12.4 % (ref 11.5–14.6)
WBC: 4.5 10*3/uL (ref 4.5–10.5)

## 2014-02-18 LAB — LIPID PANEL
CHOLESTEROL: 134 mg/dL (ref 0–200)
HDL: 41.9 mg/dL (ref >39.00)
LDL CALC: 76 mg/dL (ref 0–99)
TRIGLYCERIDES: 79 mg/dL (ref 0.0–149.0)
Total CHOL/HDL Ratio: 3
VLDL: 15.8 mg/dL (ref 0.0–40.0)

## 2014-02-18 LAB — URINALYSIS, ROUTINE W REFLEX MICROSCOPIC
Bilirubin Urine: NEGATIVE
Hgb urine dipstick: NEGATIVE
Ketones, ur: NEGATIVE
LEUKOCYTES UA: NEGATIVE
Nitrite: NEGATIVE
PH: 6 (ref 5.0–8.0)
Specific Gravity, Urine: 1.025 (ref 1.000–1.030)
Urine Glucose: 250 — AB
Urobilinogen, UA: 1 (ref 0.0–1.0)

## 2014-02-18 LAB — COMPREHENSIVE METABOLIC PANEL
ALT: 28 U/L (ref 0–53)
AST: 21 U/L (ref 0–37)
Albumin: 3.9 g/dL (ref 3.5–5.2)
Alkaline Phosphatase: 37 U/L — ABNORMAL LOW (ref 39–117)
BUN: 8 mg/dL (ref 6–23)
CALCIUM: 9.2 mg/dL (ref 8.4–10.5)
CHLORIDE: 100 meq/L (ref 96–112)
CO2: 28 mEq/L (ref 19–32)
CREATININE: 0.9 mg/dL (ref 0.4–1.5)
GFR: 106.7 mL/min (ref >60.00)
GLUCOSE: 155 mg/dL — AB (ref 70–99)
Potassium: 4.2 mEq/L (ref 3.5–5.1)
Sodium: 137 mEq/L (ref 135–145)
Total Bilirubin: 1.1 mg/dL (ref 0.3–1.2)
Total Protein: 7 g/dL (ref 6.0–8.3)

## 2014-02-18 LAB — TSH: TSH: 0.91 u[IU]/mL (ref 0.35–5.50)

## 2014-02-18 LAB — GLUCOSE, POCT (MANUAL RESULT ENTRY): POC Glucose: 143 mg/dl — AB (ref 70–99)

## 2014-02-18 LAB — HEMOGLOBIN A1C: Hgb A1c MFr Bld: 8.1 % — ABNORMAL HIGH (ref 4.6–6.5)

## 2014-02-18 LAB — PSA: PSA: 0.49 ng/mL (ref 0.10–4.00)

## 2014-02-18 NOTE — Addendum Note (Signed)
Addended by: Estell Harpin T on: 02/18/2014 03:25 PM   Modules accepted: Orders

## 2014-02-18 NOTE — Assessment & Plan Note (Signed)
He is doing well on zocor FLP CMP TSH today

## 2014-02-18 NOTE — Patient Instructions (Signed)
Health Maintenance, Males A healthy lifestyle and preventative care can promote health and wellness.  Maintain regular health, dental, and eye exams.  Eat a healthy diet. Foods like vegetables, fruits, whole grains, low-fat dairy products, and lean protein foods contain the nutrients you need and are low in calories. Decrease your intake of foods high in solid fats, added sugars, and salt. Get information about a proper diet from your health care provider, if necessary.  Regular physical exercise is one of the most important things you can do for your health. Most adults should get at least 150 minutes of moderate-intensity exercise (any activity that increases your heart rate and causes you to sweat) each week. In addition, most adults need muscle-strengthening exercises on 2 or more days a week.   Maintain a healthy weight. The body mass index (BMI) is a screening tool to identify possible weight problems. It provides an estimate of body fat based on height and weight. Your health care provider can find your BMI and can help you achieve or maintain a healthy weight. For males 20 years and older:  A BMI below 18.5 is considered underweight.  A BMI of 18.5 to 24.9 is normal.  A BMI of 25 to 29.9 is considered overweight.  A BMI of 30 and above is considered obese.  Maintain normal blood lipids and cholesterol by exercising and minimizing your intake of saturated fat. Eat a balanced diet with plenty of fruits and vegetables. Blood tests for lipids and cholesterol should begin at age 20 and be repeated every 5 years. If your lipid or cholesterol levels are high, you are over 50, or you are at high risk for heart disease, you may need your cholesterol levels checked more frequently.Ongoing high lipid and cholesterol levels should be treated with medicines, if diet and exercise are not working.  If you smoke, find out from your health care provider how to quit. If you do not use tobacco, do not  start.  Lung cancer screening is recommended for adults aged 55 80 years who are at high risk for developing lung cancer because of a history of smoking. A yearly low-dose CT scan of the lungs is recommended for people who have at least a 30-pack-year history of smoking and are a current smoker or have quit within the past 15 years. A pack year of smoking is smoking an average of 1 pack of cigarettes a day for 1 year (for example, a 30-pack-year history of smoking could mean smoking 1 pack a day for 30 years or 2 packs a day for 15 years). Yearly screening should continue until the smoker has stopped smoking for at least 15 years. Yearly screening should be stopped for people who develop a health problem that would prevent them from having lung cancer treatment.  If you choose to drink alcohol, do not have more than 2 drinks per day. One drink is considered to be 12 oz (360 mL) of beer, 5 oz (150 mL) of wine, or 1.5 oz (45 mL) of liquor.  Avoid use of street drugs. Do not share needles with anyone. Ask for help if you need support or instructions about stopping the use of drugs.  High blood pressure causes heart disease and increases the risk of stroke. Blood pressure should be checked at least every 1 2 years. Ongoing high blood pressure should be treated with medicines if weight loss and exercise are not effective.  If you are 45 72 years old, ask your health   care provider if you should take aspirin to prevent heart disease.  Diabetes screening involves taking a blood sample to check your fasting blood sugar level. This should be done once every 3 years after age 45, if you are at a normal weight and without risk factors for diabetes. Testing should be considered at a younger age or be carried out more frequently if you are overweight and have at least 1 risk factor for diabetes.  Colorectal cancer can be detected and often prevented. Most routine colorectal cancer screening begins at the age of 50  and continues through age 75. However, your health care provider may recommend screening at an earlier age if you have risk factors for colon cancer. On a yearly basis, your health care provider may provide home test kits to check for hidden blood in the stool. A small camera at the end of a tube may be used to directly examine the colon (sigmoidoscopy or colonoscopy) to detect the earliest forms of colorectal cancer. Talk to your health care provider about this at age 50, when routine screening begins. A direct exam of the colon should be repeated every 5 10 years through age 75, unless early forms of pre-cancerous polyps or small growths are found.  People who are at an increased risk for hepatitis B should be screened for this virus. You are considered at high risk for hepatitis B if:  You were born in a country where hepatitis B occurs often. Talk with your health care provider about which countries are considered high-risk.  Your parents were born in a high-risk country and you have not received a shot to protect against hepatitis B (hepatitis B vaccine).  You have HIV or AIDS.  You use needles to inject street drugs.  You live with, or have sex with, someone who has hepatitis B.  You are a man who has sex with other men (MSM).  You get hemodialysis treatment.  You take certain medicines for conditions like cancer, organ transplantation, and autoimmune conditions.  Hepatitis C blood testing is recommended for all people born from 1945 through 1965 and any individual with known risk factors for hepatitis C.  Healthy men should no longer receive prostate-specific antigen (PSA) blood tests as part of routine cancer screening. Talk to your health care provider about prostate cancer screening.  Testicular cancer screening is not recommended for adolescents or adult males who have no symptoms. Screening includes self-exam, a health care provider exam, and other screening tests. Consult with  your health care provider about any symptoms you have or any concerns you have about testicular cancer.  Practice safe sex. Use condoms and avoid high-risk sexual practices to reduce the spread of sexually transmitted infections (STIs).  Use sunscreen. Apply sunscreen liberally and repeatedly throughout the day. You should seek shade when your shadow is shorter than you. Protect yourself by wearing long sleeves, pants, a wide-brimmed hat, and sunglasses year round, whenever you are outdoors.  Tell your health care provider of new moles or changes in moles, especially if there is a change in shape or color. Also tell your provider if a mole is larger than the size of a pencil eraser.  A one-time screening for abdominal aortic aneurysm (AAA) and surgical repair of large AAAs by ultrasound is recommended for men aged 65 75 years who are current or former smokers.  Stay current with your vaccines (immunizations). Document Released: 04/29/2008 Document Revised: 08/22/2013 Document Reviewed: 03/29/2011 [employer] Patient Information 2014 [employer].   Type 2 Diabetes Mellitus, Adult Type 2 diabetes mellitus, often simply referred to as type 2 diabetes, is a long-lasting (chronic) disease. In type 2 diabetes, the pancreas does not make enough insulin (a hormone), the cells are less responsive to the insulin that is made (insulin resistance), or both. Normally, insulin moves sugars from food into the tissue cells. The tissue cells use the sugars for energy. The lack of insulin or the lack of normal response to insulin causes excess sugars to build up in the blood instead of going into the tissue cells. As a result, high blood sugar (hyperglycemia) develops. The effect of high sugar (glucose) levels can cause many complications. Type 2 diabetes was also previously called adult-onset diabetes but it can occur at any age.  RISK FACTORS  A person is predisposed to developing type 2 diabetes if someone in  the family has the disease and also has one or more of the following primary risk factors:  Overweight.  An inactive lifestyle.  A history of consistently eating high-calorie foods. Maintaining a normal weight and regular physical activity can reduce the chance of developing type 2 diabetes. SYMPTOMS  A person with type 2 diabetes may not show symptoms initially. The symptoms of type 2 diabetes appear slowly. The symptoms include:  Increased thirst (polydipsia).  Increased urination (polyuria).  Increased urination during the night (nocturia).  Weight loss. This weight loss may be rapid.  Frequent, recurring infections.  Tiredness (fatigue).  Weakness.  Vision changes, such as blurred vision.  Fruity smell to your breath.  Abdominal pain.  Nausea or vomiting.  Cuts or bruises which are slow to heal.  Tingling or numbness in the hands or feet. DIAGNOSIS Type 2 diabetes is frequently not diagnosed until complications of diabetes are present. Type 2 diabetes is diagnosed when symptoms or complications are present and when blood glucose levels are increased. Your blood glucose level may be checked by one or more of the following blood tests:  A fasting blood glucose test. You will not be allowed to eat for at least 8 hours before a blood sample is taken.  A random blood glucose test. Your blood glucose is checked at any time of the day regardless of when you ate.  A hemoglobin A1c blood glucose test. A hemoglobin A1c test provides information about blood glucose control over the previous 3 months.  An oral glucose tolerance test (OGTT). Your blood glucose is measured after you have not eaten (fasted) for 2 hours and then after you drink a glucose-containing beverage. TREATMENT   You may need to take insulin or diabetes medicine daily to keep blood glucose levels in the desired range.  You will need to match insulin dosing with exercise and healthy food choices. The  treatment goal is to maintain the before meal blood sugar (preprandial glucose) level at 70 130 mg/dL. HOME CARE INSTRUCTIONS   Have your hemoglobin A1c level checked twice a year.  Perform daily blood glucose monitoring as directed by your caregiver.  Monitor urine ketones when you are ill and as directed by your caregiver.  Take your diabetes medicine or insulin as directed by your caregiver to maintain your blood glucose levels in the desired range.  Never run out of diabetes medicine or insulin. It is needed every day.  Adjust insulin based on your intake of carbohydrates. Carbohydrates can raise blood glucose levels but need to be included in your diet. Carbohydrates provide vitamins, minerals, and fiber which are an essential part   of a healthy diet. Carbohydrates are found in fruits, vegetables, whole grains, dairy products, legumes, and foods containing added sugars.    Eat healthy foods. Alternate 3 meals with 3 snacks.  Lose weight if overweight.  Carry a medical alert card or wear your medical alert jewelry.  Carry a 15 gram carbohydrate snack with you at all times to treat low blood glucose (hypoglycemia). Some examples of 15 gram carbohydrate snacks include:  Glucose tablets, 3 or 4   Glucose gel, 15 gram tube  Raisins, 2 tablespoons (24 grams)  Jelly beans, 6  Animal crackers, 8  Regular pop, 4 ounces (120 mL)  Gummy treats, 9  Recognize hypoglycemia. Hypoglycemia occurs with blood glucose levels of 70 mg/dL and below. The risk for hypoglycemia increases when fasting or skipping meals, during or after intense exercise, and during sleep. Hypoglycemia symptoms can include:  Tremors or shakes.  Decreased ability to concentrate.  Sweating.  Increased heart rate.  Headache.  Dry mouth.  Hunger.  Irritability.  Anxiety.  Restless sleep.  Altered speech or coordination.  Confusion.  Treat hypoglycemia promptly. If you are alert and able to  safely swallow, follow the 15:15 rule:  Take 15 20 grams of rapid-acting glucose or carbohydrate. Rapid-acting options include glucose gel, glucose tablets, or 4 ounces (120 mL) of fruit juice, regular soda, or low fat milk.  Check your blood glucose level 15 minutes after taking the glucose.  Take 15 20 grams more of glucose if the repeat blood glucose level is still 70 mg/dL or below.  Eat a meal or snack within 1 hour once blood glucose levels return to normal.    Be alert to polyuria and polydipsia which are early signs of hyperglycemia. An early awareness of hyperglycemia allows for prompt treatment. Treat hyperglycemia as directed by your caregiver.  Engage in at least 150 minutes of moderate-intensity physical activity a week, spread over at least 3 days of the week or as directed by your caregiver. In addition, you should engage in resistance exercise at least 2 times a week or as directed by your caregiver.  Adjust your medicine and food intake as needed if you start a new exercise or sport.  Follow your sick day plan at any time you are unable to eat or drink as usual.  Avoid tobacco use.  Limit alcohol intake to no more than 1 drink per day for nonpregnant women and 2 drinks per day for men. You should drink alcohol only when you are also eating food. Talk with your caregiver whether alcohol is safe for you. Tell your caregiver if you drink alcohol several times a week.  Follow up with your caregiver regularly.  Schedule an eye exam soon after the diagnosis of type 2 diabetes and then annually.  Perform daily skin and foot care. Examine your skin and feet daily for cuts, bruises, redness, nail problems, bleeding, blisters, or sores. A foot exam by a caregiver should be done annually.  Brush your teeth and gums at least twice a day and floss at least once a day. Follow up with your dentist regularly.  Share your diabetes management plan with your workplace or  school.  Stay up-to-date with immunizations.  Learn to manage stress.  Obtain ongoing diabetes education and support as needed.  Participate in, or seek rehabilitation as needed to maintain or improve independence and quality of life. Request a physical or occupational therapy referral if you are having foot or hand numbness or difficulties with   grooming, dressing, eating, or physical activity. SEEK MEDICAL CARE IF:   You are unable to eat food or drink fluids for more than 6 hours.  You have nausea and vomiting for more than 6 hours.  Your blood glucose level is over 240 mg/dL.  There is a change in mental status.  You develop an additional serious illness.  You have diarrhea for more than 6 hours.  You have been sick or have had a fever for a couple of days and are not getting better.  You have pain during any physical activity.  SEEK IMMEDIATE MEDICAL CARE IF:  You have difficulty breathing.  You have moderate to large ketone levels. MAKE SURE YOU:  Understand these instructions.  Will watch your condition.  Will get help right away if you are not doing well or get worse. Document Released: 11/01/2005 Document Revised: 07/26/2012 Document Reviewed: 05/30/2012 [employer] Patient Information 2014 [employer].  

## 2014-02-18 NOTE — Progress Notes (Signed)
Pre visit review using our clinic review tool, if applicable. No additional management support is needed unless otherwise documented below in the visit note. 

## 2014-02-18 NOTE — Assessment & Plan Note (Signed)
He does not report any claudications but I think he needs an updated set of ABI's done to see how severe this is

## 2014-02-18 NOTE — Assessment & Plan Note (Addendum)

## 2014-02-18 NOTE — Assessment & Plan Note (Signed)
I will recheck his A1C and will monitor his renal function 

## 2014-02-18 NOTE — Assessment & Plan Note (Signed)
PSA today

## 2014-02-18 NOTE — Progress Notes (Signed)
Subjective:    Patient ID: Jason [name], male    DOB: 1940/12/15, 73 y.o.   MRN: [id number]  Diabetes He presents for his follow-up diabetic visit. He has type 2 diabetes mellitus. His disease course has been stable. There are no hypoglycemic associated symptoms. Pertinent negatives for hypoglycemia include no dizziness or tremors. Associated symptoms include polydipsia, polyphagia and polyuria. Pertinent negatives for diabetes include no blurred vision, no chest pain, no fatigue, no foot paresthesias, no foot ulcerations, no visual change, no weakness and no weight loss. There are no hypoglycemic complications. Symptoms are stable. Diabetic complications include PVD. Current diabetic treatment includes oral agent (dual therapy). He is compliant with treatment most of the time. His weight is increasing steadily. He is following a generally healthy diet. When asked about meal planning, he reported none. He has not had a previous visit with a dietician. He never participates in exercise. There is no change in his home blood glucose trend. An ACE inhibitor/angiotensin II receptor blocker is not being taken. He does not see a podiatrist.Eye exam is not current.      Review of Systems  Constitutional: Negative.  Negative for fever, chills, weight loss, diaphoresis and fatigue.  HENT: Negative.   Eyes: Negative.  Negative for blurred vision.  Respiratory: Negative.  Negative for apnea, cough, choking, chest tightness, shortness of breath, wheezing and stridor.   Cardiovascular: Negative.  Negative for chest pain, palpitations and leg swelling.  Gastrointestinal: Negative.  Negative for nausea, vomiting, abdominal pain, diarrhea, constipation and blood in stool.  Endocrine: Positive for polydipsia, polyphagia and polyuria.  Genitourinary: Negative.   Musculoskeletal: Negative.  Negative for arthralgias, back pain, joint swelling, myalgias and neck stiffness.  Skin: Negative.   Allergic/Immunologic:  Negative.   Neurological: Negative.  Negative for dizziness, tremors, weakness and light-headedness.  Hematological: Negative.  Negative for adenopathy. Does not bruise/bleed easily.  Psychiatric/Behavioral: Negative.        Objective:   Physical Exam  Vitals reviewed. Constitutional: He is oriented to person, place, and time. He appears well-developed and well-nourished. No distress.  HENT:  Head: Normocephalic and atraumatic.  Mouth/Throat: Oropharynx is clear and moist. No oropharyngeal exudate.  Eyes: Conjunctivae are normal. Right eye exhibits no discharge. Left eye exhibits no discharge. No scleral icterus.  Neck: Normal range of motion. Neck supple. No JVD present. No tracheal deviation present. No thyromegaly present.  Cardiovascular: Normal rate, regular rhythm and intact distal pulses.  Exam reveals no gallop and no friction rub.   No murmur heard. Pulmonary/Chest: Effort normal and breath sounds normal. No stridor. No respiratory distress. He has no wheezes. He has no rales. He exhibits no tenderness.  Abdominal: Soft. Bowel sounds are normal. He exhibits no distension and no mass. There is no tenderness. There is no rebound and no guarding. Hernia confirmed negative in the right inguinal area and confirmed negative in the left inguinal area.  Genitourinary: Prostate normal, testes normal and penis normal. Rectal exam shows no external hemorrhoid, no internal hemorrhoid and no tenderness. Prostate is not enlarged and not tender. Right testis shows no mass, no swelling and no tenderness. Right testis is descended. Left testis shows no mass, no swelling and no tenderness. Left testis is descended. Circumcised. No penile erythema or penile tenderness. No discharge found.  Musculoskeletal: Normal range of motion. He exhibits no edema and no tenderness.  Lymphadenopathy:    He has no cervical adenopathy.       Right: No inguinal adenopathy present.  Left: No inguinal adenopathy  present.  Neurological: He is oriented to person, place, and time.  Skin: Skin is warm and dry. No rash noted. He is not diaphoretic. No erythema. No pallor.  Psychiatric: He has a normal mood and affect. His behavior is normal. Judgment and thought content normal.     Lab Results  Component Value Date   WBC 5.2 10/17/2012   HGB 13.8 10/17/2012   HCT 41.0 10/17/2012   PLT 165 10/17/2012   GLUCOSE 74 07/10/2013   CHOL 136 11/29/2012   TRIG 109.0 11/29/2012   HDL 43.50 11/29/2012   LDLCALC 71 11/29/2012   ALT 17 10/17/2012   AST 17 10/17/2012   NA 138 07/10/2013   K 4.1 07/10/2013   CL 103 07/10/2013   CREATININE 1.4 07/10/2013   BUN 15 07/10/2013   CO2 28 07/10/2013   TSH 1.19 11/29/2012   PSA 0.38 11/29/2012   INR 1.05 10/17/2012   HGBA1C 6.1 07/10/2013   MICROALBUR 1.0 08/10/2007       Assessment & Plan:

## 2014-02-19 ENCOUNTER — Encounter: Payer: Self-pay | Admitting: Internal Medicine

## 2014-02-19 NOTE — Addendum Note (Signed)
Addended by: [name] on: 02/19/2014 07:28 AM   Modules accepted: Medications

## 2014-02-21 ENCOUNTER — Encounter (HOSPITAL_COMMUNITY): Payer: Medicare HMO

## 2014-02-28 ENCOUNTER — Ambulatory Visit (HOSPITAL_COMMUNITY): Payer: Medicare HMO | Attending: Cardiology | Admitting: Cardiology

## 2014-02-28 DIAGNOSIS — I798 Other disorders of arteries, arterioles and capillaries in diseases classified elsewhere: Secondary | ICD-10-CM | POA: Diagnosis not present

## 2014-02-28 DIAGNOSIS — E1159 Type 2 diabetes mellitus with other circulatory complications: Secondary | ICD-10-CM

## 2014-02-28 DIAGNOSIS — I739 Peripheral vascular disease, unspecified: Secondary | ICD-10-CM

## 2014-02-28 DIAGNOSIS — M1990 Unspecified osteoarthritis, unspecified site: Secondary | ICD-10-CM

## 2014-02-28 LAB — HM DIABETES EYE EXAM

## 2014-02-28 NOTE — Progress Notes (Signed)
Lower arterial Doppler and ABI's performed

## 2014-05-27 ENCOUNTER — Telehealth: Payer: Self-pay | Admitting: *Deleted

## 2014-05-27 NOTE — Telephone Encounter (Signed)
Left msg on triage requesting md to call something in for his cold sxs. Called pt back inform him must bee seen before md can rx something. Made appt for tomorrow @ 11:00...Jason [name]

## 2014-05-28 ENCOUNTER — Ambulatory Visit (INDEPENDENT_AMBULATORY_CARE_PROVIDER_SITE_OTHER): Payer: Medicare HMO | Admitting: Internal Medicine

## 2014-05-28 ENCOUNTER — Encounter: Payer: Self-pay | Admitting: Internal Medicine

## 2014-05-28 ENCOUNTER — Ambulatory Visit (INDEPENDENT_AMBULATORY_CARE_PROVIDER_SITE_OTHER)
Admission: RE | Admit: 2014-05-28 | Discharge: 2014-05-28 | Disposition: A | Discharge status: Home / Self Care | Payer: Medicare HMO | Source: Ambulatory Visit | Attending: Internal Medicine | Admitting: Internal Medicine

## 2014-05-28 VITALS — BP 150/58 | HR 74 | Temp 98.1°F | Resp 16 | Ht >= 80 in | Wt 302.8 lb

## 2014-05-28 DIAGNOSIS — J189 Pneumonia, unspecified organism: Secondary | ICD-10-CM | POA: Insufficient documentation

## 2014-05-28 DIAGNOSIS — R059 Cough, unspecified: Secondary | ICD-10-CM | POA: Insufficient documentation

## 2014-05-28 DIAGNOSIS — R05 Cough: Secondary | ICD-10-CM | POA: Insufficient documentation

## 2014-05-28 MED ORDER — HYDROCODONE-HOMATROPINE 5-1.5 MG/5ML PO SYRP: 5.0000 mL | ORAL_SOLUTION | Freq: Three times a day (TID) | ORAL | Status: DC | PRN

## 2014-05-28 MED ORDER — AZITHROMYCIN 500 MG PO TABS: 500.0000 mg | ORAL_TABLET | Freq: Every day | ORAL | Status: DC

## 2014-05-28 NOTE — Progress Notes (Signed)
Subjective:    Patient ID: Jason [name], male    DOB: 1941-06-17, 73 y.o.   MRN: [id number]  Cough This is a new problem. The current episode started in the past 7 days. The problem has been gradually worsening. The problem occurs every few hours. The cough is productive of purulent sputum. Associated symptoms include chills, a fever, nasal congestion and postnasal drip. Pertinent negatives include no chest pain, ear congestion, ear pain, headaches, heartburn, hemoptysis, myalgias, rash, rhinorrhea, sore throat, shortness of breath, sweats, weight loss or wheezing. He has tried OTC cough suppressant for the symptoms. The treatment provided mild relief. His past medical history is significant for pneumonia. There is no history of asthma, bronchiectasis, bronchitis, COPD or environmental allergies.      Review of Systems  Constitutional: Positive for fever and chills. Negative for weight loss, diaphoresis, activity change, appetite change, fatigue and unexpected weight change.  HENT: Positive for congestion and postnasal drip. Negative for ear pain, facial swelling, rhinorrhea, sinus pressure, sore throat, trouble swallowing and voice change.   Eyes: Negative.   Respiratory: Positive for cough. Negative for apnea, hemoptysis, choking, chest tightness, shortness of breath, wheezing and stridor.   Cardiovascular: Negative.  Negative for chest pain, palpitations and leg swelling.  Gastrointestinal: Negative.  Negative for heartburn, nausea, vomiting, abdominal pain, diarrhea, constipation and blood in stool.  Endocrine: Negative.   Genitourinary: Negative.   Musculoskeletal: Negative.  Negative for myalgias.  Skin: Negative.  Negative for rash.  Allergic/Immunologic: Negative.  Negative for environmental allergies.  Neurological: Negative.  Negative for dizziness, tremors, syncope, light-headedness, numbness and headaches.  Hematological: Negative.  Negative for adenopathy. Does not  bruise/bleed easily.  Psychiatric/Behavioral: Negative.        Objective:   Physical Exam  Vitals reviewed. Constitutional: He is oriented to person, place, and time. He appears well-developed and well-nourished.  Non-toxic appearance. He does not have a sickly appearance. He does not appear ill. No distress.  HENT:  Head: Normocephalic and atraumatic.  Mouth/Throat: Oropharynx is clear and moist. No oropharyngeal exudate.  Eyes: Conjunctivae are normal. Right eye exhibits no discharge. Left eye exhibits no discharge. No scleral icterus.  Neck: Normal range of motion. Neck supple. No JVD present. No tracheal deviation present. No thyromegaly present.  Cardiovascular: Normal rate, regular rhythm, normal heart sounds and intact distal pulses.  Exam reveals no gallop and no friction rub.   No murmur heard. Pulmonary/Chest: Effort normal. No accessory muscle usage or stridor. Not tachypneic. No respiratory distress. He has no decreased breath sounds. He has no wheezes. He has no rhonchi. He has rales in the right lower field and the left lower field. He exhibits no tenderness.  Abdominal: Soft. Bowel sounds are normal. He exhibits no distension and no mass. There is no tenderness. There is no rebound and no guarding.  Musculoskeletal: He exhibits no edema and no tenderness.  Lymphadenopathy:    He has no cervical adenopathy.  Neurological: He is oriented to person, place, and time.  Skin: Skin is warm and dry. No rash noted. He is not diaphoretic. No erythema. No pallor.  Psychiatric: He has a normal mood and affect. His behavior is normal. Judgment and thought content normal.     Lab Results  Component Value Date   WBC 4.5 02/18/2014   HGB 13.5 02/18/2014   HCT 39.8 02/18/2014   PLT 169.0 02/18/2014   GLUCOSE 155* 02/18/2014   CHOL 134 02/18/2014   TRIG 79.0 02/18/2014  HDL 41.90 02/18/2014   LDLCALC 76 02/18/2014   ALT 28 02/18/2014   AST 21 02/18/2014   NA 137 02/18/2014   K 4.2 02/18/2014   CL 100  02/18/2014   CREATININE 0.9 02/18/2014   BUN 8 02/18/2014   CO2 28 02/18/2014   TSH 0.91 02/18/2014   PSA 0.49 02/18/2014   INR 1.05 10/17/2012   HGBA1C 8.1* 02/18/2014   MICROALBUR 1.0 08/10/2007       Assessment & Plan:

## 2014-05-28 NOTE — Patient Instructions (Signed)
Cough, Adult  A cough is a reflex that helps clear your throat and airways. It can help heal the body or may be a reaction to an irritated airway. A cough may only last 2 or 3 weeks (acute) or may last more than 8 weeks (chronic).  CAUSES Acute cough:  Viral or bacterial infections. Chronic cough:  Infections.  Allergies.  Asthma.  Post-nasal drip.  Smoking.  Heartburn or acid reflux.  Some medicines.  Chronic lung problems (COPD).  Cancer. SYMPTOMS   Cough.  Fever.  Chest pain.  Increased breathing rate.  High-pitched whistling sound when breathing (wheezing).  Colored mucus that you cough up (sputum). TREATMENT   A bacterial cough may be treated with antibiotic medicine.  A viral cough must run its course and will not respond to antibiotics.  Your caregiver may recommend other treatments if you have a chronic cough. HOME CARE INSTRUCTIONS   Only take over-the-counter or prescription medicines for pain, discomfort, or fever as directed by your caregiver. Use cough suppressants only as directed by your caregiver.  Use a cold steam vaporizer or humidifier in your bedroom or home to help loosen secretions.  Sleep in a semi-upright position if your cough is worse at night.  Rest as needed.  Stop smoking if you smoke. SEEK IMMEDIATE MEDICAL CARE IF:   You have pus in your sputum.  Your cough starts to worsen.  You cannot control your cough with suppressants and are losing sleep.  You begin coughing up blood.  You have difficulty breathing.  You develop pain which is getting worse or is uncontrolled with medicine.  You have a fever. MAKE SURE YOU:   Understand these instructions.  Will watch your condition.  Will get help right away if you are not doing well or get worse. Document Released: 04/30/2011 Document Revised: 01/24/2012 Document Reviewed: 04/30/2011 ExitCare Patient Information 2015 [employer]. This information is not intended  to replace advice given to you by your health care provider. Make sure you discuss any questions you have with your health care provider.  

## 2014-05-28 NOTE — Assessment & Plan Note (Signed)
Will treat the infection with zithromax and will control the cough with hycodan 

## 2014-05-28 NOTE — Assessment & Plan Note (Signed)
Chest xray is normal

## 2014-05-28 NOTE — Progress Notes (Signed)
Pre visit review using our clinic review tool, if applicable. No additional management support is needed unless otherwise documented below in the visit note. 

## 2014-06-27 ENCOUNTER — Telehealth: Payer: Self-pay | Admitting: *Deleted

## 2014-06-27 DIAGNOSIS — E1159 Type 2 diabetes mellitus with other circulatory complications: Secondary | ICD-10-CM

## 2014-06-27 MED ORDER — GLIMEPIRIDE 2 MG PO TABS: 2.0000 mg | ORAL_TABLET | Freq: Every day | ORAL | Status: DC

## 2014-06-27 NOTE — Telephone Encounter (Signed)
Pt stated he was trying to get his glimepiride refilled, but pharmacy stated it was decline. Inform pt on current med list there is no glimepiride. Need to clarify if pt suppose to be taking. Pls advise...Jason [name]

## 2014-06-27 NOTE — Telephone Encounter (Signed)
I reordered it.

## 2014-06-27 NOTE — Telephone Encounter (Signed)
Notified pt spoke with wife gave md response. Also [facility] since [facility]...Jason [name]

## 2014-08-07 ENCOUNTER — Telehealth: Payer: Self-pay | Admitting: Internal Medicine

## 2014-08-07 NOTE — Telephone Encounter (Signed)
Patient states glucose read over 600 at 9:05 this morning.  At 9:15 it read 177.  Please advise.

## 2014-08-07 NOTE — Telephone Encounter (Signed)
Called spoke with patient who stated that glucose is back to 177. I advised to keep check and follow up appt scheduled.

## 2014-08-09 ENCOUNTER — Ambulatory Visit (INDEPENDENT_AMBULATORY_CARE_PROVIDER_SITE_OTHER): Payer: Commercial Managed Care - HMO | Admitting: Internal Medicine

## 2014-08-09 ENCOUNTER — Encounter: Payer: Self-pay | Admitting: Internal Medicine

## 2014-08-09 ENCOUNTER — Other Ambulatory Visit (INDEPENDENT_AMBULATORY_CARE_PROVIDER_SITE_OTHER): Payer: Commercial Managed Care - HMO

## 2014-08-09 VITALS — BP 110/64 | HR 73 | Temp 97.8°F | Resp 16 | Ht >= 80 in | Wt 312.0 lb

## 2014-08-09 DIAGNOSIS — E1159 Type 2 diabetes mellitus with other circulatory complications: Secondary | ICD-10-CM

## 2014-08-09 DIAGNOSIS — K589 Irritable bowel syndrome without diarrhea: Secondary | ICD-10-CM

## 2014-08-09 DIAGNOSIS — I739 Peripheral vascular disease, unspecified: Secondary | ICD-10-CM

## 2014-08-09 DIAGNOSIS — K219 Gastro-esophageal reflux disease without esophagitis: Secondary | ICD-10-CM

## 2014-08-09 DIAGNOSIS — Z23 Encounter for immunization: Secondary | ICD-10-CM

## 2014-08-09 LAB — HM DIABETES FOOT EXAM

## 2014-08-09 LAB — BASIC METABOLIC PANEL
BUN: 10 mg/dL (ref 6–23)
CHLORIDE: 101 meq/L (ref 96–112)
CO2: 28 mEq/L (ref 19–32)
Calcium: 9.1 mg/dL (ref 8.4–10.5)
Creatinine, Ser: 0.9 mg/dL (ref 0.4–1.5)
GFR: 106.55 mL/min (ref >60.00)
Glucose, Bld: 167 mg/dL — ABNORMAL HIGH (ref 70–99)
POTASSIUM: 4 meq/L (ref 3.5–5.1)
Sodium: 135 mEq/L (ref 135–145)

## 2014-08-09 LAB — HEMOGLOBIN A1C: Hgb A1c MFr Bld: 7.8 % — ABNORMAL HIGH (ref 4.6–6.5)

## 2014-08-09 MED ORDER — RAMIPRIL 5 MG PO CAPS: 5.0000 mg | ORAL_CAPSULE | Freq: Every day | ORAL | Status: DC

## 2014-08-09 MED ORDER — CANAGLIFLOZIN-METFORMIN HCL 150-1000 MG PO TABS: 1.0000 | ORAL_TABLET | Freq: Two times a day (BID) | ORAL | Status: DC

## 2014-08-09 NOTE — Progress Notes (Signed)
Subjective:    Patient ID: Jason [name], male    DOB: 18-Jan-1941, 73 y.o.   MRN: [id number]  Diabetes He presents for his follow-up diabetic visit. He has type 2 diabetes mellitus. His disease course has been worsening. There are no hypoglycemic associated symptoms. Pertinent negatives for hypoglycemia include no dizziness or tremors. Associated symptoms include polydipsia and polyphagia. Pertinent negatives for diabetes include no blurred vision, no chest pain, no fatigue, no foot paresthesias, no foot ulcerations, no polyuria, no visual change, no weakness and no weight loss. Symptoms are worsening. Diabetic complications include PVD. Current diabetic treatment includes oral agent (dual therapy). He is compliant with treatment most of the time. His weight is increasing steadily. He is following a generally unhealthy diet. When asked about meal planning, he reported none. He participates in exercise intermittently. There is no change in his home blood glucose trend. An ACE inhibitor/angiotensin II receptor blocker is contraindicated. He does not see a podiatrist.Eye exam is current.      Review of Systems  Constitutional: Negative.  Negative for fever, chills, weight loss, diaphoresis, appetite change and fatigue.  HENT: Negative.   Eyes: Negative.  Negative for blurred vision.  Respiratory: Negative.  Negative for cough, choking, chest tightness, shortness of breath, wheezing and stridor.   Cardiovascular: Negative.  Negative for chest pain, palpitations and leg swelling.  Gastrointestinal: Negative.  Negative for nausea, vomiting, abdominal pain, diarrhea, constipation and blood in stool.  Endocrine: Positive for polydipsia and polyphagia. Negative for polyuria.  Genitourinary: Negative.   Musculoskeletal: Negative.  Negative for arthralgias, back pain, joint swelling and myalgias.  Skin: Negative.   Allergic/Immunologic: Negative.   Neurological: Negative.  Negative for dizziness,  tremors, syncope, facial asymmetry, weakness, light-headedness and numbness.  Hematological: Negative.  Negative for adenopathy. Does not bruise/bleed easily.  Psychiatric/Behavioral: Negative.        Objective:   Physical Exam  Vitals reviewed. Constitutional: He is oriented to person, place, and time. He appears well-developed and well-nourished. No distress.  HENT:  Head: Normocephalic and atraumatic.  Mouth/Throat: Oropharynx is clear and moist. No oropharyngeal exudate.  Eyes: Conjunctivae are normal. Right eye exhibits no discharge. Left eye exhibits no discharge. No scleral icterus.  Neck: Normal range of motion. Neck supple. No JVD present. No tracheal deviation present. No thyromegaly present.  Cardiovascular: Normal rate, regular rhythm, S1 normal and S2 normal.  Exam reveals no gallop, no S3, no S4 and no friction rub.   Murmur heard.  Decrescendo systolic murmur is present with a grade of 1/6   No diastolic murmur is present  Pulmonary/Chest: Effort normal and breath sounds normal. No stridor. No respiratory distress. He has no wheezes. He has no rales. He exhibits no tenderness.  Abdominal: Soft. Bowel sounds are normal. He exhibits no distension and no mass. There is no tenderness. There is no rebound and no guarding.  Musculoskeletal: Normal range of motion. He exhibits no edema and no tenderness.  Lymphadenopathy:    He has no cervical adenopathy.  Neurological: He is oriented to person, place, and time.  Skin: Skin is warm and dry. No rash noted. He is not diaphoretic. No erythema. No pallor.    Lab Results  Component Value Date   WBC 4.5 02/18/2014   HGB 13.5 02/18/2014   HCT 39.8 02/18/2014   PLT 169.0 02/18/2014   GLUCOSE 155* 02/18/2014   CHOL 134 02/18/2014   TRIG 79.0 02/18/2014   HDL 41.90 02/18/2014   LDLCALC 76 02/18/2014  ALT 28 02/18/2014   AST 21 02/18/2014   NA 137 02/18/2014   K 4.2 02/18/2014   CL 100 02/18/2014   CREATININE 0.9 02/18/2014   BUN 8 02/18/2014   CO2 28  02/18/2014   TSH 0.91 02/18/2014   PSA 0.49 02/18/2014   INR 1.05 10/17/2012   HGBA1C 8.1* 02/18/2014   MICROALBUR 1.0 08/10/2007        Assessment & Plan:

## 2014-08-09 NOTE — Patient Instructions (Signed)

## 2014-08-09 NOTE — Assessment & Plan Note (Signed)
I think he would benefit from being on an ACEI Will start ramipril

## 2014-08-09 NOTE — Assessment & Plan Note (Signed)
His blood sugars are not well controlled Will add invokana to the SU and metformin

## 2014-08-09 NOTE — Progress Notes (Signed)
Pre visit review using our clinic review tool, if applicable. No additional management support is needed unless otherwise documented below in the visit note. 

## 2014-09-02 ENCOUNTER — Telehealth: Payer: Self-pay | Admitting: Internal Medicine

## 2014-09-02 MED ORDER — PRODIGY LANCETS 28G MISC: Status: DC

## 2014-09-02 MED ORDER — PRODIGY AUTOCODE BLOOD GLUCOSE DEVI: Status: DC

## 2014-09-02 MED ORDER — GLUCOSE BLOOD VI STRP: ORAL_STRIP | Status: DC

## 2014-09-02 NOTE — Telephone Encounter (Signed)
Called [facility] for [place] RTC...Johny Chess

## 2014-09-02 NOTE — Telephone Encounter (Signed)
Need verbal order for pt's test strips - Lancet and meter.

## 2014-09-02 NOTE — Telephone Encounter (Signed)
Received call back from [place] pt is requesting to have his glucose monitor/supplies with [facility]. Requesting authorization. Spoke with pharmacist chris ok order for monitor & supplies...Johny Chess

## 2014-11-11 ENCOUNTER — Other Ambulatory Visit: Payer: Self-pay | Admitting: Internal Medicine

## 2014-11-11 ENCOUNTER — Other Ambulatory Visit: Payer: Self-pay | Admitting: Pulmonary Disease

## 2014-11-12 ENCOUNTER — Telehealth: Payer: Self-pay | Admitting: Internal Medicine

## 2014-11-12 NOTE — Telephone Encounter (Signed)
Patient is requesting scripts for zocor, metformin, amaryl to be sent to [place].

## 2014-11-13 ENCOUNTER — Telehealth: Payer: Self-pay | Admitting: *Deleted

## 2014-11-13 DIAGNOSIS — E119 Type 2 diabetes mellitus without complications: Secondary | ICD-10-CM

## 2014-11-13 MED ORDER — SIMVASTATIN 40 MG PO TABS: ORAL_TABLET | ORAL | Status: DC

## 2014-11-13 MED ORDER — CANAGLIFLOZIN-METFORMIN HCL 150-1000 MG PO TABS: 1.0000 | ORAL_TABLET | Freq: Two times a day (BID) | ORAL | Status: DC

## 2014-11-14 ENCOUNTER — Telehealth: Payer: Self-pay | Admitting: Internal Medicine

## 2014-11-14 MED ORDER — ASPIRIN EC 81 MG PO TBEC: 81.0000 mg | DELAYED_RELEASE_TABLET | Freq: Every day | ORAL | Status: DC

## 2014-11-14 MED ORDER — PRODIGY LANCETS 28G MISC: Status: DC

## 2014-11-14 MED ORDER — GLUCOSE BLOOD VI STRP: ORAL_STRIP | Status: DC

## 2014-11-14 MED ORDER — VITAMIN C 500 MG PO TABS: 500.0000 mg | ORAL_TABLET | Freq: Every day | ORAL | Status: DC

## 2014-11-14 NOTE — Telephone Encounter (Signed)
Notified pt refills sent to [facility]!...Jason [name]

## 2014-11-14 NOTE — Telephone Encounter (Signed)
Pt called in requesting refills on his:  **Vit C **Lancets **Test Strips  **81 Mg Asprin   Sent to: [facility] [place], [address] [phone]

## 2014-11-15 MED ORDER — GLIMEPIRIDE 2 MG PO TABS: 2.0000 mg | ORAL_TABLET | Freq: Every day | ORAL | Status: DC

## 2014-11-15 NOTE — Addendum Note (Signed)
Addended by: [name] on: 11/15/2014 11:49 AM   Modules accepted: Orders

## 2014-11-15 NOTE — Telephone Encounter (Signed)
done

## 2014-11-18 ENCOUNTER — Telehealth: Payer: Self-pay | Admitting: Internal Medicine

## 2014-11-18 ENCOUNTER — Other Ambulatory Visit: Payer: Self-pay | Admitting: *Deleted

## 2014-11-18 DIAGNOSIS — E119 Type 2 diabetes mellitus without complications: Secondary | ICD-10-CM

## 2014-11-18 DIAGNOSIS — E785 Hyperlipidemia, unspecified: Secondary | ICD-10-CM

## 2014-11-18 DIAGNOSIS — E1151 Type 2 diabetes mellitus with diabetic peripheral angiopathy without gangrene: Secondary | ICD-10-CM

## 2014-11-18 DIAGNOSIS — I739 Peripheral vascular disease, unspecified: Secondary | ICD-10-CM

## 2014-11-18 DIAGNOSIS — I70209 Unspecified atherosclerosis of native arteries of extremities, unspecified extremity: Principal | ICD-10-CM

## 2014-11-18 MED ORDER — CANAGLIFLOZIN-METFORMIN HCL 150-1000 MG PO TABS: 1.0000 | ORAL_TABLET | Freq: Two times a day (BID) | ORAL | Status: DC

## 2014-11-18 MED ORDER — SIMVASTATIN 40 MG PO TABS: ORAL_TABLET | ORAL | Status: DC

## 2014-11-18 MED ORDER — SIMVASTATIN 40 MG PO TABS
ORAL_TABLET | ORAL | Status: DC
Start: 2014-11-18 — End: 2015-12-15

## 2014-11-18 NOTE — Telephone Encounter (Signed)
Patient states that he is requesting to go back on metformin b/c it works better.  Looks like script was sent to [place] along with simvastatin. Patient states he spoke with pharmacy this morning and they do not have the script for either.

## 2014-11-18 NOTE — Telephone Encounter (Signed)
Called patient to notify that RX's have been sent to local [place]  Per Dr Ronnald Ramp

## 2014-11-18 NOTE — Telephone Encounter (Signed)
Rx's were sent again to local and mail order pharmacies

## 2014-11-22 ENCOUNTER — Telehealth: Payer: Self-pay | Admitting: Internal Medicine

## 2014-11-22 DIAGNOSIS — E119 Type 2 diabetes mellitus without complications: Secondary | ICD-10-CM

## 2014-11-22 MED ORDER — CANAGLIFLOZIN-METFORMIN HCL 150-1000 MG PO TABS: 1.0000 | ORAL_TABLET | Freq: Two times a day (BID) | ORAL | Status: DC

## 2014-11-22 NOTE — Telephone Encounter (Signed)
Notified patient's wife [facility] [address]

## 2014-11-22 NOTE — Telephone Encounter (Signed)
Patient states wrong med was sent to pharmacy.  He states [facility]  Patient is requesting a phone call in regards.

## 2014-11-22 NOTE — Telephone Encounter (Signed)
invokamet has metformin in it, this is his new diabetes medication

## 2014-11-22 NOTE — Telephone Encounter (Signed)
done

## 2014-11-22 NOTE — Telephone Encounter (Signed)
Patient is waiting on meformin to arrive from mail order pharmacy.  He is currently out.  He is requesting a small [facility] [address] to get [facility] through.

## 2015-02-17 ENCOUNTER — Encounter: Payer: Self-pay | Admitting: Gastroenterology

## 2015-02-26 ENCOUNTER — Other Ambulatory Visit: Payer: Self-pay | Admitting: Pulmonary Disease

## 2015-03-03 ENCOUNTER — Telehealth: Payer: Self-pay | Admitting: Internal Medicine

## 2015-03-03 ENCOUNTER — Other Ambulatory Visit: Payer: Self-pay

## 2015-03-03 DIAGNOSIS — E1151 Type 2 diabetes mellitus with diabetic peripheral angiopathy without gangrene: Secondary | ICD-10-CM

## 2015-03-03 DIAGNOSIS — I70209 Unspecified atherosclerosis of native arteries of extremities, unspecified extremity: Principal | ICD-10-CM

## 2015-03-03 MED ORDER — METFORMIN HCL 500 MG PO TABS: 500.0000 mg | ORAL_TABLET | Freq: Two times a day (BID) | ORAL | Status: DC

## 2015-03-03 NOTE — Telephone Encounter (Signed)
Patient is requesting a temporary RX for metformin. He states that he is in need of about 5 days worth while the new script is mailed.

## 2015-03-03 NOTE — Telephone Encounter (Signed)
Called pt verified medication. On med list he suppose to be taking Canagliflozin-metformin [id number] mg. Pt stated he started med but med was dropping his BS too fast and he was not feeling right. He stop and started bck taking the Metformin 500 twice a day. He is wanting rx for metformin sent to mail order & 5 day supply to local.../lmb

## 2015-03-03 NOTE — Telephone Encounter (Signed)
done

## 2015-03-03 NOTE — Telephone Encounter (Signed)
Notified pt md sent to both pharmacies...Jason [name]

## 2015-03-26 ENCOUNTER — Telehealth: Payer: Self-pay | Admitting: Internal Medicine

## 2015-03-26 NOTE — Telephone Encounter (Signed)
Patient advises that his blood testing meter and test strips are not functioning properly. He is requesting for new ones to be ordered. Please contact patient.  i verified that pharmacy is [facility]

## 2015-03-27 MED ORDER — GLUCOSE BLOOD VI STRP: ORAL_STRIP | Status: DC

## 2015-03-27 NOTE — Telephone Encounter (Signed)
Spoke with patient who will stop by the office to pick up a new onetouch meter and I will send additional strips to his mail order

## 2015-04-18 DIAGNOSIS — E119 Type 2 diabetes mellitus without complications: Secondary | ICD-10-CM | POA: Diagnosis not present

## 2015-04-28 ENCOUNTER — Encounter: Payer: Self-pay | Admitting: Gastroenterology

## 2015-05-02 ENCOUNTER — Encounter: Payer: Self-pay | Admitting: Gastroenterology

## 2015-05-20 ENCOUNTER — Ambulatory Visit (AMBULATORY_SURGERY_CENTER): Payer: Self-pay

## 2015-05-20 VITALS — Ht >= 80 in | Wt 307.4 lb

## 2015-05-20 DIAGNOSIS — Z860100 Personal history of colon polyps, unspecified: Secondary | ICD-10-CM

## 2015-05-20 MED ORDER — SUPREP BOWEL PREP KIT 17.5-3.13-1.6 GM/177ML PO SOLN: 1.0000 | Freq: Once | ORAL | Status: DC

## 2015-05-20 NOTE — Progress Notes (Signed)
No allergies to eggs or soy No diet/weight loss meds No home oxygen No past problems with anesthesia  No computer for emmi

## 2015-06-03 ENCOUNTER — Encounter: Payer: Self-pay | Admitting: Gastroenterology

## 2015-06-03 ENCOUNTER — Ambulatory Visit (AMBULATORY_SURGERY_CENTER): Payer: Commercial Managed Care - HMO | Admitting: Gastroenterology

## 2015-06-03 VITALS — BP 109/69 | HR 60 | Temp 98.4°F | Resp 17 | Ht >= 80 in | Wt 307.0 lb

## 2015-06-03 DIAGNOSIS — K635 Polyp of colon: Secondary | ICD-10-CM

## 2015-06-03 DIAGNOSIS — D123 Benign neoplasm of transverse colon: Secondary | ICD-10-CM

## 2015-06-03 DIAGNOSIS — D124 Benign neoplasm of descending colon: Secondary | ICD-10-CM

## 2015-06-03 DIAGNOSIS — D122 Benign neoplasm of ascending colon: Secondary | ICD-10-CM

## 2015-06-03 DIAGNOSIS — Z86010 Personal history of colonic polyps: Secondary | ICD-10-CM

## 2015-06-03 LAB — GLUCOSE, CAPILLARY
GLUCOSE-CAPILLARY: 149 mg/dL — AB (ref 65–99)
GLUCOSE-CAPILLARY: 136 mg/dL — AB (ref 65–99)

## 2015-06-03 MED ORDER — SODIUM CHLORIDE 0.9 % IV SOLN: 500.0000 mL | INTRAVENOUS | Status: DC

## 2015-06-03 NOTE — Patient Instructions (Signed)
YOU HAD AN ENDOSCOPIC PROCEDURE TODAY AT [facility]:   Refer to the procedure report that was given to you for any specific questions about what was found during the examination.  If the procedure report does not answer your questions, please call your gastroenterologist to clarify.  If you requested that your care partner not be given the details of your procedure findings, then the procedure report has been included in a sealed envelope for you to review at your convenience later.  YOU SHOULD EXPECT: Some feelings of bloating in the abdomen. Passage of more gas than usual.  Walking can help get rid of the air that was put into your GI tract during the procedure and reduce the bloating. If you had a lower endoscopy (such as a colonoscopy or flexible sigmoidoscopy) you may notice spotting of blood in your stool or on the toilet paper. If you underwent a bowel prep for your procedure, you may not have a normal bowel movement for a few days.  Please Note:  You might notice some irritation and congestion in your nose or some drainage.  This is from the oxygen used during your procedure.  There is no need for concern and it should clear up in a day or so.  SYMPTOMS TO REPORT IMMEDIATELY:   Following lower endoscopy (colonoscopy or flexible sigmoidoscopy):  Excessive amounts of blood in the stool  Significant tenderness or worsening of abdominal pains  Swelling of the abdomen that is new, acute  Fever of 100F or higher   For urgent or emergent issues, a gastroenterologist can be reached at any hour by calling [phone].   DIET: Your first meal following the procedure should be a small meal and then it is ok to progress to your normal diet. Heavy or fried foods are harder to digest and may make you feel nauseous or bloated.  Likewise, meals heavy in dairy and vegetables can increase bloating.  Drink plenty of fluids but you should avoid alcoholic beverages for 24 hours. Try to  increase the fiber and water in your diet.  ACTIVITY:  You should plan to take it easy for the rest of today and you should NOT DRIVE or use heavy machinery until tomorrow (because of the sedation medicines used during the test).    FOLLOW UP: Our staff will call the number listed on your records the next business day following your procedure to check on you and address any questions or concerns that you may have regarding the information given to you following your procedure. If we do not reach you, we will leave a message.  However, if you are feeling well and you are not experiencing any problems, there is no need to return our call.  We will assume that you have returned to your regular daily activities without incident.  Read all of the handouts given to you by your recovery room nurse. If any biopsies were taken you will be contacted by phone or by letter within the next 1-3 weeks.  Please call us at [phone] if you have not heard about the biopsies in 3 weeks.    SIGNATURES/CONFIDENTIALITY: You and/or your care partner have signed paperwork which will be entered into your electronic medical record.  These signatures attest to the fact that that the information above on your After Visit Summary has been reviewed and is understood.  Full responsibility of the confidentiality of this discharge information lies with you and/or your care-partner.

## 2015-06-03 NOTE — Progress Notes (Signed)
Called to room to assist during endoscopic procedure.  Patient ID and intended procedure confirmed with present staff. Received instructions for my participation in the procedure from the performing physician.  

## 2015-06-03 NOTE — Op Note (Signed)
[facility]  [address] [place], [id number]   COLONOSCOPY PROCEDURE REPORT  PATIENT: Cedar, Ditullio  MR#: [id number] BIRTHDATE: 1941/01/08 , 74  yrs. old GENDER: male ENDOSCOPIST: Ladene Artist, MD, [facility] PROCEDURE DATE:  06/03/2015 PROCEDURE:   Colonoscopy, surveillance , Colonoscopy with biopsy, and Colonoscopy with snare polypectomy First Screening Colonoscopy - Avg.  risk and is 50 yrs.  old or older - No.  Prior Negative Screening - Now for repeat screening. N/A  History of Adenoma - Now for follow-up colonoscopy & has been > or = to 3 yrs.  Yes hx of adenoma.  Has been 3 or more years since last colonoscopy.  Polyps removed today? Yes ASA CLASS:   Class III INDICATIONS:Surveillance due to prior colonic neoplasia and PH Colon Adenoma. MEDICATIONS: Monitored anesthesia care and Propofol 160 mg IV DESCRIPTION OF PROCEDURE:   After the risks benefits and alternatives of the procedure were thoroughly explained, informed consent was obtained.  The digital rectal exam revealed no abnormalities of the rectum.   The LB [id number]  endoscope was introduced through the anus and advanced to the cecum, which was identified by both the appendix and ileocecal valve. No adverse events experienced.   The quality of the prep was good.  (Suprep was used)  The instrument was then slowly withdrawn as the colon was fully examined. Estimated blood loss is zero unless otherwise noted in this procedure report.  COLON FINDINGS: Four sessile polyps measuring 5-6 mm in size were found in the transverse colon (3) and ascending colon (1). Polypectomies were performed with a cold snare.  The resection was complete, the polyp tissue was completely retrieved and sent to histology.   A sessile polyp measuring 4 mm in size was found in the descending colon.  A polypectomy was performed with cold forceps.  The resection was complete, the polyp tissue was completely retrieved and  sent to histology.  There was mild diverticulosis noted in the sigmoid colon.   The examination was otherwise normal.   Retroflexed views revealed internal Grade I hemorrhoids. The time to cecum = 1.4 Withdrawal time = 13.3   The scope was withdrawn and the procedure completed. COMPLICATIONS: There were no immediate complications.  ENDOSCOPIC IMPRESSION: 1.   Four sessile polyps in the transverse colon and ascending colon; polypectomies performed with a cold snare 2.   Sessile polyp in the descending colon; polypectomy performed with cold forceps 3.   Mild diverticulosis in the sigmoid colon 4.   Grade l internal hemorrhoids  RECOMMENDATIONS: 1.  Await pathology results 2.  High fiber diet with liberal fluid intake. 3.  Repeat colonoscopy in 5 years if 3 or 4 polyps adenomatous; otherwise 5 years  eSigned:  Ladene Artist, MD, [facility] 06/03/2015 10:43 AM

## 2015-06-03 NOTE — Progress Notes (Signed)
A/ox3 pleased with MAC, report to Suzanne RN 

## 2015-06-04 ENCOUNTER — Telehealth: Payer: Self-pay | Admitting: *Deleted

## 2015-06-04 NOTE — Telephone Encounter (Signed)
  Follow up Call-  Call back number 06/03/2015  Post procedure Call Back phone  # [phone] hm  Permission to leave phone message Yes     Patient questions:  Do you have a fever, pain , or abdominal swelling? No. Pain Score  0 *  Have you tolerated food without any problems? Yes.    Have you been able to return to your normal activities? Yes.    Do you have any questions about your discharge instructions: Diet   No. Medications  No. Follow up visit  No.  Do you have questions or concerns about your Care? No.  Actions: * If pain score is 4 or above: No action needed, pain <4.  C/o sneezing and nose running, explained this may be from the [place]2 [place], encouraged patient to call us back if needed. He understands.

## 2015-06-10 ENCOUNTER — Encounter: Payer: Self-pay | Admitting: Gastroenterology

## 2015-06-10 LAB — HM COLONOSCOPY

## 2015-07-28 ENCOUNTER — Telehealth: Payer: Self-pay | Admitting: Internal Medicine

## 2015-07-28 DIAGNOSIS — H5213 Myopia, bilateral: Secondary | ICD-10-CM | POA: Diagnosis not present

## 2015-07-28 DIAGNOSIS — H5210 Myopia, unspecified eye: Secondary | ICD-10-CM | POA: Diagnosis not present

## 2015-07-28 LAB — HM DIABETES EYE EXAM

## 2015-07-28 NOTE — Telephone Encounter (Signed)
Rec'd from [facility] forward 2 pages to [place]

## 2015-08-01 ENCOUNTER — Encounter: Payer: Self-pay | Admitting: Internal Medicine

## 2015-09-01 ENCOUNTER — Other Ambulatory Visit: Payer: Self-pay | Admitting: Internal Medicine

## 2015-09-24 ENCOUNTER — Encounter: Payer: Self-pay | Admitting: Internal Medicine

## 2015-09-24 ENCOUNTER — Ambulatory Visit (INDEPENDENT_AMBULATORY_CARE_PROVIDER_SITE_OTHER)
Admission: RE | Admit: 2015-09-24 | Discharge: 2015-09-24 | Disposition: A | Discharge status: Home / Self Care | Payer: Commercial Managed Care - HMO | Source: Ambulatory Visit | Attending: Internal Medicine | Admitting: Internal Medicine

## 2015-09-24 ENCOUNTER — Ambulatory Visit (INDEPENDENT_AMBULATORY_CARE_PROVIDER_SITE_OTHER): Payer: Commercial Managed Care - HMO | Admitting: Internal Medicine

## 2015-09-24 ENCOUNTER — Other Ambulatory Visit (INDEPENDENT_AMBULATORY_CARE_PROVIDER_SITE_OTHER): Payer: Commercial Managed Care - HMO

## 2015-09-24 VITALS — BP 122/76 | HR 73 | Temp 97.6°F | Resp 20 | Ht >= 80 in | Wt 314.1 lb

## 2015-09-24 DIAGNOSIS — Z720 Tobacco use: Secondary | ICD-10-CM | POA: Diagnosis not present

## 2015-09-24 DIAGNOSIS — E785 Hyperlipidemia, unspecified: Secondary | ICD-10-CM | POA: Diagnosis not present

## 2015-09-24 DIAGNOSIS — N400 Enlarged prostate without lower urinary tract symptoms: Secondary | ICD-10-CM

## 2015-09-24 DIAGNOSIS — E1151 Type 2 diabetes mellitus with diabetic peripheral angiopathy without gangrene: Secondary | ICD-10-CM

## 2015-09-24 DIAGNOSIS — I70209 Unspecified atherosclerosis of native arteries of extremities, unspecified extremity: Secondary | ICD-10-CM | POA: Diagnosis not present

## 2015-09-24 DIAGNOSIS — E1159 Type 2 diabetes mellitus with other circulatory complications: Secondary | ICD-10-CM

## 2015-09-24 DIAGNOSIS — I739 Peripheral vascular disease, unspecified: Secondary | ICD-10-CM

## 2015-09-24 DIAGNOSIS — J449 Chronic obstructive pulmonary disease, unspecified: Secondary | ICD-10-CM

## 2015-09-24 DIAGNOSIS — R059 Cough, unspecified: Secondary | ICD-10-CM | POA: Insufficient documentation

## 2015-09-24 DIAGNOSIS — R05 Cough: Secondary | ICD-10-CM | POA: Diagnosis not present

## 2015-09-24 DIAGNOSIS — Z0000 Encounter for general adult medical examination without abnormal findings: Secondary | ICD-10-CM | POA: Diagnosis not present

## 2015-09-24 LAB — FECAL OCCULT BLOOD, GUAIAC: Fecal Occult Blood: NEGATIVE

## 2015-09-24 LAB — COMPREHENSIVE METABOLIC PANEL
ALT: 24 U/L (ref 0–53)
AST: 20 U/L (ref 0–37)
Albumin: 4.2 g/dL (ref 3.5–5.2)
Alkaline Phosphatase: 39 U/L (ref 39–117)
BILIRUBIN TOTAL: 0.8 mg/dL (ref 0.2–1.2)
BUN: 14 mg/dL (ref 6–23)
CHLORIDE: 103 meq/L (ref 96–112)
CO2: 29 meq/L (ref 19–32)
CREATININE: 0.92 mg/dL (ref 0.40–1.50)
Calcium: 9.3 mg/dL (ref 8.4–10.5)
GFR: 103.56 mL/min (ref >60.00)
GLUCOSE: 149 mg/dL — AB (ref 70–99)
Potassium: 4.3 mEq/L (ref 3.5–5.1)
SODIUM: 139 meq/L (ref 135–145)
Total Protein: 7 g/dL (ref 6.0–8.3)

## 2015-09-24 LAB — URINALYSIS, ROUTINE W REFLEX MICROSCOPIC
Bilirubin Urine: NEGATIVE
HGB URINE DIPSTICK: NEGATIVE
Ketones, ur: NEGATIVE
Leukocytes, UA: NEGATIVE
Nitrite: NEGATIVE
SPECIFIC GRAVITY, URINE: 1.02 (ref 1.000–1.030)
Total Protein, Urine: NEGATIVE
URINE GLUCOSE: 100 — AB
UROBILINOGEN UA: 2 — AB (ref 0.0–1.0)
pH: 7 (ref 5.0–8.0)

## 2015-09-24 LAB — CBC WITH DIFFERENTIAL/PLATELET
BASOS ABS: 0 10*3/uL (ref 0.0–0.1)
BASOS PCT: 0.3 % (ref 0.0–3.0)
Eosinophils Absolute: 0.4 10*3/uL (ref 0.0–0.7)
Eosinophils Relative: 7.5 % — ABNORMAL HIGH (ref 0.0–5.0)
HCT: 43.2 % (ref 39.0–52.0)
Hemoglobin: 14.3 g/dL (ref 13.0–17.0)
LYMPHS ABS: 0.9 10*3/uL (ref 0.7–4.0)
Lymphocytes Relative: 17.7 % (ref 12.0–46.0)
MCHC: 33.1 g/dL (ref 30.0–36.0)
MCV: 95.3 fl (ref 78.0–100.0)
MONOS PCT: 7.8 % (ref 3.0–12.0)
Monocytes Absolute: 0.4 10*3/uL (ref 0.1–1.0)
NEUTROS ABS: 3.6 10*3/uL (ref 1.4–7.7)
NEUTROS PCT: 66.7 % (ref 43.0–77.0)
PLATELETS: 156 10*3/uL (ref 150.0–400.0)
RBC: 4.54 Mil/uL (ref 4.22–5.81)
RDW: 12.7 % (ref 11.5–15.5)
WBC: 5.4 10*3/uL (ref 4.0–10.5)

## 2015-09-24 LAB — LIPID PANEL
CHOL/HDL RATIO: 3
Cholesterol: 134 mg/dL (ref 0–200)
HDL: 41.7 mg/dL (ref >39.00)
LDL CALC: 78 mg/dL (ref 0–99)
NONHDL: 91.97
Triglycerides: 69 mg/dL (ref 0.0–149.0)
VLDL: 13.8 mg/dL (ref 0.0–40.0)

## 2015-09-24 LAB — MICROALBUMIN / CREATININE URINE RATIO
CREATININE, U: 142.5 mg/dL
MICROALB UR: 2.3 mg/dL — AB (ref 0.0–1.9)
MICROALB/CREAT RATIO: 1.6 mg/g (ref 0.0–30.0)

## 2015-09-24 LAB — HEMOGLOBIN A1C: HEMOGLOBIN A1C: 6.3 % (ref 4.6–6.5)

## 2015-09-24 LAB — PSA: PSA: 0.44 ng/mL (ref 0.10–4.00)

## 2015-09-24 LAB — TSH: TSH: 1.14 u[IU]/mL (ref 0.35–4.50)

## 2015-09-24 NOTE — Patient Instructions (Signed)

## 2015-09-24 NOTE — Progress Notes (Signed)
Pre visit review using our clinic review tool, if applicable. No additional management support is needed unless otherwise documented below in the visit note. 

## 2015-09-25 ENCOUNTER — Encounter: Payer: Self-pay | Admitting: Internal Medicine

## 2015-09-25 NOTE — Progress Notes (Signed)
Subjective:  Patient ID: Jason [name], male    DOB: [birth year]  Age: 74 y.o. MRN: [id number]  CC: Cough; Hypertension; Hyperlipidemia; Diabetes; and Annual Exam   HPI DOUG MASLO presents for a physical but he also complains of a cough for several months. It is rarely productive and when it is it is a clear phlegm. He does have a history of tobacco abuse but says he quit smoking a few days ago. He also complains of frequent urination.  Outpatient Prescriptions Prior to Visit  Medication Sig Dispense Refill  . Alcohol Swabs (B-D SINGLE USE SWABS REGULAR) PADS Use once daily 100 each 3  . aspirin EC 81 MG tablet Take 1 tablet (81 mg total) by mouth daily. 90 tablet 3  . Blood Glucose Monitoring Suppl (PRODIGY AUTOCODE BLOOD GLUCOSE) DEVI Check blood sugar once daily 1 each 0  . glimepiride (AMARYL) 2 MG tablet Take 1 tablet (2 mg total) by mouth daily with breakfast. 90 tablet 1  . glucose blood (ONETOUCH VERIO) test strip Text twice daily Dx:E11.59 200 each 3  . metFORMIN (GLUCOPHAGE) 500 MG tablet Take 1 tablet (500 mg total) by mouth 2 (two) times daily with a meal. 60 tablet 1  . PRODIGY LANCETS 28G MISC Use to help check BS twice a day Dx 11.59 180 each 3  . simvastatin (ZOCOR) 40 MG tablet Take 1 tablet by mouth  every evening 90 tablet 3  . vitamin C (ASCORBIC ACID) 500 MG tablet Take 1 tablet (500 mg total) by mouth daily. 90 tablet 3   No facility-administered medications prior to visit.    ROS Review of Systems  Constitutional: Negative for fever, chills, diaphoresis, appetite change and fatigue.  HENT: Negative.  Negative for congestion, facial swelling, sore throat, tinnitus, trouble swallowing and voice change.   Eyes: Negative.   Respiratory: Positive for cough and wheezing. Negative for apnea, choking, chest tightness, shortness of breath and stridor.   Cardiovascular: Negative.  Negative for chest pain, palpitations and leg swelling.  Gastrointestinal: Negative.   Negative for nausea, vomiting, abdominal pain, diarrhea and constipation.  Endocrine: Positive for polyuria. Negative for polydipsia and polyphagia.  Genitourinary: Positive for frequency. Negative for dysuria, urgency, hematuria, decreased urine volume, penile swelling, enuresis and difficulty urinating.  Musculoskeletal: Negative.  Negative for myalgias, back pain, arthralgias and neck pain.  Skin: Negative.  Negative for rash.  Allergic/Immunologic: Negative.   Neurological: Negative.  Negative for dizziness.  Hematological: Negative.  Negative for adenopathy. Does not bruise/bleed easily.  Psychiatric/Behavioral: Negative.     Objective:  BP 122/76 mmHg  Pulse 73  Temp(Src) 97.6 F (36.4 C) (Oral)  Resp 20  Ht 6\' 8"  (2.032 m)  Wt 314 lb 2 oz (142.486 kg)  BMI 34.51 kg/m2  SpO2 97%  BP Readings from Last 3 Encounters:  09/24/15 122/76  06/03/15 109/69  08/09/14 110/64    Wt Readings from Last 3 Encounters:  09/24/15 314 lb 2 oz (142.486 kg)  06/03/15 307 lb (139.254 kg)  05/20/15 307 lb 6.4 oz (139.436 kg)    Physical Exam  Constitutional: He is oriented to person, place, and time. He appears well-developed and well-nourished. No distress.  HENT:  Mouth/Throat: Oropharynx is clear and moist. No oropharyngeal exudate.  Eyes: Conjunctivae are normal. Right eye exhibits no discharge. Left eye exhibits no discharge. No scleral icterus.  Neck: Normal range of motion. Neck supple. No JVD present. No tracheal deviation present. No thyromegaly present.  Cardiovascular: Normal rate,  regular rhythm, normal heart sounds and intact distal pulses.  Exam reveals no gallop and no friction rub.   No murmur heard. Pulmonary/Chest: Effort normal and breath sounds normal. No stridor. No respiratory distress. He has no wheezes. He has no rales. He exhibits no tenderness.  Abdominal: Soft. Bowel sounds are normal. He exhibits no distension and no mass. There is no tenderness. There is no  rebound and no guarding. Hernia confirmed negative in the right inguinal area and confirmed negative in the left inguinal area.  Genitourinary: Rectum normal, testes normal and penis normal. Rectal exam shows no external hemorrhoid, no internal hemorrhoid, no fissure, no mass, no tenderness and anal tone normal. Guaiac negative stool. Prostate is enlarged (1+ smooth symm BPH). Prostate is not tender. Right testis shows no mass, no swelling and no tenderness. Right testis is descended. Left testis shows no mass, no swelling and no tenderness. Left testis is descended. Circumcised. No penile erythema or penile tenderness. No discharge found.  Musculoskeletal: Normal range of motion. He exhibits no edema or tenderness.  Lymphadenopathy:    He has no cervical adenopathy.       Right: No inguinal adenopathy present.       Left: No inguinal adenopathy present.  Neurological: He is oriented to person, place, and time.  Skin: Skin is warm and dry. No rash noted. He is not diaphoretic. No erythema. No pallor.  Vitals reviewed.   Lab Results  Component Value Date   WBC 5.4 09/24/2015   HGB 14.3 09/24/2015   HCT 43.2 09/24/2015   PLT 156.0 09/24/2015   GLUCOSE 149* 09/24/2015   CHOL 134 09/24/2015   TRIG 69.0 09/24/2015   HDL 41.70 09/24/2015   LDLCALC 78 09/24/2015   ALT 24 09/24/2015   AST 20 09/24/2015   NA 139 09/24/2015   K 4.3 09/24/2015   CL 103 09/24/2015   CREATININE 0.92 09/24/2015   BUN 14 09/24/2015   CO2 29 09/24/2015   TSH 1.14 09/24/2015   PSA 0.44 09/24/2015   INR 1.05 10/17/2012   HGBA1C 6.3 09/24/2015   MICROALBUR 2.3* 09/24/2015    Dg Chest 2 View  09/24/2015  CLINICAL DATA:  Routine physical exam, former smoker, history of asthma EXAM: CHEST  2 VIEW COMPARISON:  Chest x-ray of 05/28/2014 FINDINGS: Linear atelectasis or scarring remains at the lung base on the lateral view. No focal infiltrate or effusion is seen. Mediastinal and hilar contours are unchanged. The heart  is within normal limits in size. No acute bony abnormality is seen. IMPRESSION: No active cardiopulmonary disease. Electronically Signed   By: Ivar Drape M.D.   On: 09/24/2015 10:44    Assessment & Plan:   Panav was seen today for cough, hypertension, hyperlipidemia, diabetes and annual exam.  Diagnoses and all orders for this visit:  Cough- his exam and chest x-ray are normal, I think he is related smoking-related lung disease and of asked him to see pulmonary for further evaluation. -     Ambulatory referral to Pulmonology -     DG Chest 2 View; Future -     Ambulatory Referral for Lung Cancer Scre  Hyperlipidemia with target LDL less than 70- he has achieved his LDL goal and is doing well on the statin. -     Lipid panel; Future -     Comprehensive metabolic panel; Future -     CBC with Differential/Platelet; Future -     TSH; Future  Tobacco abuse -  Ambulatory Referral for Lung Cancer Scre  PAD (peripheral artery disease) ([place])- he has not recently had any claudication, will continue the statin, risk factor modification and an aspirin a day. -     Lipid panel; Future  Diabetes mellitus type 2 with atherosclerosis of arteries of extremities ([place])- his blood sugars are well-controlled, lites and renal function are stable. -     Lipid panel; Future -     Hemoglobin A1c; Future -     Microalbumin / creatinine urine ratio; Future  BPH (benign prostatic hypertrophy)- he does not have any signs or symptoms that need to be treated and his PSA is not suspicious for prostate cancer. -     Urinalysis, Routine w reflex microscopic (not at [facility]); Future -     PSA; Future  Obstructive chronic bronchitis without exacerbation ([place])- I've asked him to see pulmonary for further evaluation of this. -     Ambulatory Referral for Lung Cancer Scre   I am having Mr. Dutko maintain his B-D SINGLE USE SWABS REGULAR, [employer] Blood Glucose, vitamin C, PRODIGY LANCETS 28G, aspirin EC,  glimepiride, simvastatin, metFORMIN, and glucose blood.  No orders of the defined types were placed in this encounter.     Follow-up: Return in about 4 months (around 01/22/2016).  Scarlette Calico, MD

## 2015-10-01 ENCOUNTER — Other Ambulatory Visit: Payer: Self-pay | Admitting: Internal Medicine

## 2015-10-08 ENCOUNTER — Encounter: Payer: Self-pay | Admitting: Pulmonary Disease

## 2015-10-08 ENCOUNTER — Ambulatory Visit (INDEPENDENT_AMBULATORY_CARE_PROVIDER_SITE_OTHER): Payer: Commercial Managed Care - HMO | Admitting: Pulmonary Disease

## 2015-10-08 VITALS — BP 114/60 | HR 69 | Temp 98.1°F | Wt 308.4 lb

## 2015-10-08 DIAGNOSIS — J410 Simple chronic bronchitis: Secondary | ICD-10-CM | POA: Diagnosis not present

## 2015-10-08 DIAGNOSIS — R05 Cough: Secondary | ICD-10-CM

## 2015-10-08 DIAGNOSIS — Z720 Tobacco use: Secondary | ICD-10-CM | POA: Diagnosis not present

## 2015-10-08 DIAGNOSIS — R059 Cough, unspecified: Secondary | ICD-10-CM

## 2015-10-08 MED ORDER — ALBUTEROL SULFATE HFA 108 (90 BASE) MCG/ACT IN AERS: 1.0000 | INHALATION_SPRAY | Freq: Four times a day (QID) | RESPIRATORY_TRACT | Status: DC | PRN

## 2015-10-08 NOTE — Patient Instructions (Signed)
Mr. Vale-- it was great seeing you again...  Today we checked a few simple tests of lung function- Spirometry & an ambulatory oxygen saturation test...  You have already taken the MOST IMPORTANT step inimproving your lung health-- you quit smoking: CONGRATS!!!    In addition I am prescribing an inhaler to help w/ the wheezing you hear- use the PROAIR-HFA 1-2 sprays every 6H as needed for wheezing...  OK to continue the [facility] 600mg  tabs - 2 tabs twice daily w/ fluids for the thick phlegm...  Call for any questions...  Let's plan a follow up visit in 2-3 months, sooner if needed for problems.Marland KitchenMarland Kitchen

## 2015-10-08 NOTE — Progress Notes (Signed)
Subjective:    Patient ID: Jason [name], male    DOB: 1941-11-15, 74 y.o.   MRN: [id number]  HPI 74 y/o BM here for a follow up visit... he has mult med problems as listed below>> ~  SEE PREV EPIC NOTES FOR OLDER DATA >>   ~  Adm 07/2011 for SBO & required ELap w/ lysis of adhesions by DrBlackman, CCS; post-op problems included ileus, cough, pleural effusion, & minor wound complic managed as outpt & now back to baseline...  LABS 7/13:  Chems- ok x BS=116, A1c=7.1  LABS 1/14:  FLP- at goals on Simva40;  Chems- ok x BS=146, A1c=7.2;  TSH=1.19, PSA=0.38   ~  July 10, 2013:  58moROV & Jason [name] has a rash on his arm- pruritic, was working in his yard, & c/w mild poison ivy- improved w/ Calamine & OTC meds... We reviewed the following medical problems during today's office visit >>`    MVP>  On ASA daily & 2DEcho 10/11 showed mild LVH, norm LVF w/ EF= 55-60%, ant leaflet MVP seen w/ MR, mod LAdil; he denies CP, palpit, SOB, edema...    Ven Insuffic>  Hx VV, VI, stasis changes w/ ulcer/ cellulitis in past; s/p laser to left GSV by DrEarly & s/p right 2nd toe amp for osteomyelitis by DrBednarz 2010.    Chol>  On Simva40 and FLP 1/14 shows TChol 136, TG 109, HDL 44, LDL 71... keep same dose + better diet & get wt down!    DM>  On Metformin 500Bid & Glimep'1mg'$ Qam; BS at home are improved & Labs here 8/14 showed BS= 74, A1c= 6.4; encouraged to lose wt...    Obesity>  weight is down to 314# today; we reviewed the critical need for wt reducting diet + exercise program...    GI>  Adm 9/12 w/ SBO & had ELap w/ lysis of adhesions;  Now back to baseline w/o abd pain, N/V/D/C/ or blood seen...    GU>  He saw DrTannenbaum et al 3/12 (we don't have more recent notes) for f/u BPH, ED, & PSA recheck;  Voiding satis, given Cialis & PSA= 0.47 (he had 58cc PVR- not on meds at present); PSA checks here have been stable at 0.38...    DJD/ LBP>  Hx LBP w/ walking, no radiation;  Hx right foot problems w/ amp 1st 3 toes  now for osteomyelitis ([facility] Cord Podiatrist, DRay...    Anxiety>  He related difficulty caring for family esp elderly mother who is quite demanding & conflict w/ his daughter/ her 2 kids/ etc... We reviewed prob list, meds, xrays and labs> see below for updates >>   LABS 8/14:  FLP- ok w/ BS=74, A1c=6.4, Cr=1.4..Marland Kitchen   ~  October 08, 2015:      268yrOV & asked to check pt by DrTJones due to cough> Jason [name] has only a minimal smoking hx- started in his late teens, smoked for only 12 yrs up to 1/2 ppd at the max, and quit in 1973 (age31); he worked at [employer] 1971 to 2005 & was exposed to 2nd hand smoke;  He has had an occas bronchitic infection, and one bout of pneumonia in 2009, but no recurrent infections, no hx TB or other toxic exposures (no known asbestos etc);  He tells me that he has been under a lot of stress- caring for wife, elderly uncle who lives w/ them, and his 9148/o mother w/ cancer now on hospice;  Due to the stress he  started smoking again 06/2014 up to 1ppd but had his last cigarette ~2wks ago- says he has quit for good now;  He is c/o cough, mild chest congestion, small amt beige sputum w/o hemoptysis; he notes occas wheezing but denies SOB, CP, palpit, ch in edema- he is still able to mow yard, walking, does chores/ shopping etc w/o giving out. W/o SOB, etc;  He notes that mucinex helps the congestion...    EXAM shows Afeb, VSS, O2sat=97% on RA;  HEENT- neg, mallampati2;  Chest- clear, sl decr BS at bases, congested cough, w/o w/r/r;  Heart- RR gr1/6SEM w/o r/g;  Abd- obese, soft, neg;  Ext- +VI otherw neg w/o c/c/e...   Last CXR 09/24/15 showed norm heart size, linear atx vs scarring at left base, otherw clear, NAD...   Spirometry 10/08/15 showed FVC=3.51 (69%), FEV1=2.68 (71%), %1sec=76%, mid-flows reduced at 64% predicted... This is suggestive of mild restrictive ventilatory defect w/ superimposed mild small airways dis...   Ambulatory oxygen saturation test 10/08/15> O2sat=97%  on RA at rest w/ pulse=72/min; he ambulated 3 laps in the office w/ lowest O2sat=92% w/ pulse=99/min...   LABS 09/2015>  Chems- wnl x BS=149, A1c=6.3;  CBC- wnl w/ Hg=14.3, WBC=5.4 & 8%eos;  TSH=1.14;  PSA=0.44 IMP/PLAN>>  Jason [name] has a min smoking hx but restarted ~51yr ago, then quit again 2 wks ago; c/o cough & congestion w/ some intermittent wheezing c/w chr bronchitis; his Spirometry show some mild restriction suspected and superimposed small airways dis;  Now that he has quit smoking he does not want to get on a regular inhaler if not absolutely needed, so we decided to treat w/ PROAIR-HFA 1-2 sp Q6H prn + MUCINEX 600mg - 1-2Bid w/ fluids;  If he does not improve rapidly/ resolve- then we could prescribe an ICS/LABA or use parenteral therapy if needed... We discussed an ROV in 2-23mo, sooner if needed.           Problem List:     His PCP is DrTJones, [facility]...   Hx of SINUSITIS (ICD-473.9)  Hx Pneumonia - seen in ER 7/09 w/ faint RLL infiltrate that cleared serially w/ antibiotic Rx... ~  CXR 9/13 showed norm heart size, clear lungs w/ mild left basilar scarring 7 mild degen changes in Tspine...  MITRAL VALVE PROLAPSE (ICD-424.0) - on ASA 81mg /d... he denies CP, palpit, dyspnea... exam w/ MR murmur... ~  2DEcho 5/01 showed mild ant leaflet MVP, mild MR, norm LVF. ~  f/u 2DEcho 10/11 showed mild LVH, norm LVF w/ EF= 55-60%, ant leaflet MVP seen w/ MR, mod LAdil ~  EKG 9/12 showed NSR, rate80, NSSTTWA ~  7/13:  BP= 118/64 on diet Rx alone & reminded to elim sodium, get wt down... ~  EKG 9/13 showed NSR, rate 79, NSSTTWA... ~  1/14:  BP= 128/72 & he remains stoic & largely asymptomatic... ~  8/14:  BP= 122/60 & he denies CP, palpit, SOB, etc... ~  2DEcho 10/11 showed mild LVH, norm LVF w/ EF=55-60%, +MVP (ant leaflet), mild MR, mod LA dil, AoV wnl, norm RV  VENOUS INSUFFICIENCY (ICD-459.81) - he knows to elim salt, elevate legs, wear TED's... ~  neg ven dopplers in 1996...  ~  he saw  DrLawson in 2000 for a ven stasis ulcer on his leg... rec for TED's, no salt, elevation, etc...  ~  he had normal ABI's in 10/06... ~  3/10:  left leg wound w/ cellulitis & +MRSA... Rx'd and refer back to wound care clinic. ~  7/10:  sched for right second toe amputation due to osteomyelitis despite hyperbaric O2 etc... ~  8/10: s/p right second toe amp for osteomyelitis Beola Cord), and s/p laser ablation of left greater saphenous vein (DrEarly). ~  7/13:  He tells me that DrTuckman, Podiatrist has been treating his right great toe (ulcer on plantar surface)- we will call for notes. ~  He subsequently had 2 more toes amputated by DrDuda; subseq well healed & doing satis...  HYPERCHOLESTEROLEMIA (ICD-272.0) - changed to SIMVASTATIN 40mg  Qhs... ~  FLP 9/08 showed TChol 145, TG 54, HDL 46, LDL 88 ~  FLP 9/09 showed TChol 144, Tg 65, HDL 48, LDL 83 ~  FLP 9/10 showed TChol 146, TG 52, HDL 51, LDL 85 ~  FLP 3/12 showed TChol 141, TG 55, HDL 50, LDL 81... keep same dose + better diet & get wt down! ~  [place] 1/13 on Simva40 showed TChol 134, TG 48, HDL 48, LDL 77... Continue same. ~  [place] 1/14 on Simva40 showed TChol 136, TG 109, HDL 44, LDL 71  DIABETES MELLITUS (ICD-250.00) - on METFORMIN 500mg  Bid + GLIMEPIRIDE 1mg /d, and diet efforts... ~  labs 9/08 showed BS=123, HgA1c=6.0.Marland Kitchen. urine microalb was neg... ~  labs 9/09 showed BS= 144, HgA1c= 6.1.Marland Kitchen. rec> same med, better diet. ~  labs 3/10 showed BS= 147, HgA1c= 6.9.Marland Kitchen. rec> incr the MetformBid, but he never did. ~  7/10: he states the wound care clinic increased his glucose intake due to Hyperbaric O2 Rx... we increased his Metform500 Bid. ~  labs 9/10 on Metform500Bid showed BS= 154, A1c= 6.4 ~  labs 3/11 on Metform500Bid showed BS= 192, A1c= 7.5.Marland Kitchen. rec> add Glimep 1mg /d. ~  labs 9/11 on Metform500Bid+Glim1 showed BS= 124, A1c= 5.8.Marland Kitchen. rec> decr Glimep1mg  to 1/2 tab daily (he never did). ~  Labs 3/12 on Metform500Bid+Glim1 showed BS= 114, A1c= 5.9.Marland KitchenMarland Kitchen  Rec> decr Glim1mg - to 1/2 tab. ~  Labs in [facility] 9/12 SBO showed BS betw 78 - 164 ~  Labs 1/13 on Metform500Bid+Glim0.5 showed BS= 138, A1c= 6.2.Marland KitchenMarland Kitchen Rec> continue same, get wt down. ~  Labs 7/13 on Metform500Bid+Glim0.5 showed BS= 116, A1c= 7.1.Marland KitchenMarland Kitchen Not as good, same meds, better diet. ~  Labs 1/14 on Metform500Bid+Glim0.5 showed BS= 146, A1c= 7.2.Marland KitchenMarland Kitchen Rec to incr Glim1mg Qam. ~  Labs 8/14 on Metform500Bid+Glim1 showed BS= 74, A1c= 6.4.Marland KitchenMarland Kitchen Continue same.  OBESITY (ICD-278.00) - he is very large at 6\' 8"  tall and >300# for the last 10+ yrs... peak = 340#... he drinks beer daily... discussed diet + exercise program (and no beer)... ~  he had transient right gynecomastia 9/09, no discharge, neg mammogram & resolved on it's own... ~  weight 3/11 = 333# ~  weight 9/11 = 319# ~  Weight 3/12 = 327# ~  Weight 10/12 = 310#  post hosp for SBO... ~  Weight 1/13 = 329# ~  Weight 7/13 = 323# ~  Weight 1/14 = 326# ~  Weight 8/14 = 314#  GERD (ICD-530.81) >> not currently taking a PPI med...  DIVERTICULOSIS OF COLON (ICD-562.10) / IRRITABLE BOWEL SYNDROME (ICD-564.1) & COLONIC POLYPS (ICD-211.3) ~  colonoscopy 3/06 by DrStark showing divertics & 2-19mm polyps (adenomas)... ~  colonoscopy 9/11 by DrStark showed divertics & 2mm polyp= tubular adenoma... f/u planned 29yrs. ~  colonoscopy 7/16 by DrStark showed mild diertics, int hems, 5 polyps 4-66mm size & Bx= tubular adenomas and one serrated polyp, no atypia...  BENIGN PROSTATIC HYPERTROPHY & ED - eval by DrTannenbaum 7/08 and Rx w/ Cialis; he  has tried the PEP shots; + fam hx prostate ca in his father... ~  8/10:  DrTannenbaum did circ for phimosis, pt states that PSA check was OK... ~  3/12:  f/u BPH, ED, & PSA recheck;  Voiding satis, given Cialis & PSA= 0.47 (he had 58cc PVR- not on meds at present). ~  1/13:  Labs here showed PSA= 0.39 (copy to DrTannenbaum) ~  1/14:  Labs showed PSA= 0.38  DEGENERATIVE JOINT DISEASE (ICD-715.90) - he uses OTC meds Prn, and  was rec to start Vit supplements by DrTannenbaum- takes MVI, VitC, VitD... he has seen DrHilts in the past... ~  He has had the 1st 3 toes of right foot amputated...  Hx of HIDRADENITIS (ICD-705.83)  ANXIETY>  He related difficulty caring for family esp elderly mother who is quite demanding & conflict w/ his daughter/ her 2 kids/ etc...  Health Maintenance - up to date on colon & prostate screens... PNEUMOVAX 9/09... TETANUS booster 3/10... gets yearly Flu vaccines in Fall.   Past Surgical History  Procedure Laterality Date  . Knee arthroscopy    . Toe amputation  06/2009    x3Right foot second toe -Dr Beola Cord  . Laser ablation  06/2009    Left Greater Saphenous vein -Dr Donnetta Hutching  . Circumcision  06/2100    For Phimosis - Dr Hartley Barefoot  . Laparotomy  07/20/11  . Colon surgery    . Tonsillectomy    . Amputation  08/09/2012    Procedure: AMPUTATION DIGIT;  Surgeon: Newt Minion, MD;  Location: [place];  Service: Orthopedics;  Laterality: Right;  Amputation right Great Toe MTP Joint  . Amputation  10/17/2012    Procedure: AMPUTATION DIGIT;  Surgeon: Newt Minion, MD;  Location: [place];  Service: Orthopedics;  Laterality: Right;  Right Foot 3rd Toe Amputation at Metatarsophalangeal Joint    Outpatient Encounter Prescriptions as of 10/08/2015  Medication Sig  . Alcohol Swabs (B-D SINGLE USE SWABS REGULAR) PADS Use once daily  . aspirin EC 81 MG tablet Take 1 tablet (81 mg total) by mouth daily.  . Blood Glucose Monitoring Suppl (PRODIGY AUTOCODE BLOOD GLUCOSE) DEVI Check blood sugar once daily  . glimepiride (AMARYL) 2 MG tablet Take 1 tablet (2 mg total) by mouth daily with breakfast.  . glucose blood (ONETOUCH VERIO) test strip Text twice daily Dx:E11.59  . metFORMIN (GLUCOPHAGE) 500 MG tablet TAKE 1 TABLET (500 MG TOTAL) BY MOUTH 2 (TWO) TIMES DAILY WITH A MEAL.  Marland Kitchen PRODIGY LANCETS 28G MISC Use to help check BS twice a day Dx 11.59  . simvastatin (ZOCOR) 40 MG tablet Take 1 tablet by mouth   every evening  . vitamin C (ASCORBIC ACID) 500 MG tablet Take 1 tablet (500 mg total) by mouth daily.    No Known Allergies   Current Medications, Allergies, Past Medical History, Past Surgical History, Family History, and Social History were reviewed in [employer] record.    Review of Systems         See HPI - all other systems neg except as noted> c/o recent cough, chest congestion The patient denies anorexia, fever, weight loss, weight gain, vision loss, decreased hearing, hoarseness, chest pain, syncope, dyspnea on exertion, peripheral edema, headaches, hemoptysis, abdominal pain, melena, hematochezia, severe indigestion/heartburn, hematuria, incontinence, muscle weakness, suspicious skin lesions, transient blindness, difficulty walking, depression, unusual weight change, abnormal bleeding, enlarged lymph nodes, and angioedema.     Objective:   Physical Exam  WD, Overweight, very large 74 y/o BM in NAD... GENERAL:  Alert & oriented; pleasant & cooperative. HEENT:  [place]/AT, EOM-wnl, PERRLA, EACs-clear, TMs-wnl, NOSE-clear, THROAT-clear & wnl (mallamapti2) NECK:  Supple w/ fairROM; no JVD; normal carotid impulses w/o bruits; no thyromegaly or nodules palpated; no lymphadenopathy. CHEST:  Clear to P & A; without wheezes/ rales/ or rhonchi heard; sl congested cough... HEART:  Regular Rhythm; gr 1/6 SEM without rubs or gallops detected... ABDOMEN:  Soft & nontender; normal bowel sounds; no organomegaly or masses detected. EXT: without deformities, mod arthritic changes; wearing TEDs +varicose veins/ +venous insuffic/ chr 1+ edema... NEURO:  CN's intact;  no focal neuro deficits... Derm:  Mild stasis changes in LEs w/o cellulitis etc...  RADIOLOGY DATA:  Reviewed in the EPIC EMR & discussed w/ the patient...  LABORATORY DATA:  Reviewed in the EPIC EMR & discussed w/ the patient...   Assessment & Plan:       Cough, chest congestion, intermittent wheezing>  he restarted smoking, then quit again; eval is c/w mild pulm restriction & small airway dis => suspect mild chr bronchitis;  He does not want to start regular inhalers so we discussed diet, exercise, wt reduction strategies, and the use of PROAIR HFA prn + MUCINEX 1200mg  Bid w/ fluids...      Hx Sinusitis/ Hx Pneumonia>  During the 9/12 hosp for SBO he developed an ileus & pleural effusion which resolved serially...   MEDICAL ISSUES per DrTJones>>     MVP>  Mild MVP on 2DEcho & he denies CP, palpit, ch in SOB, edmea, etc...    Ven Insuffic>  He had vein surg from DrEarly; knows to elim sodium, elevate legs, wear support hose, etc...    Chol>  On Simva40 + diet;  FLP looks good...    DM>  Controlled on Metformin & Glimepiride; he needs better diet, incr exercise, get wt down...       Diabetic foot ulcer> he lost right 2nd toe to osteomyelitis in 2010; now w/ ulcer on plantar aspect great toe> under the care of Podiatry=> DrDuda had to amputate 2 toes 9/13...    Obesity>  As noted, wt down to 310# after his hosp for SBO; then right back up to 329# after the holidays...    GI> s/p SBO w/ surg, Hx GERD/ Divertics/ IBS/ Polyps> GI per DrStark & post-op DrBlackman...    GU> BPH & ED followed by DrTannenbaum...    DJD> on OTC analgesics as needed...    ANXIETY>  We discussed coping strategies for the family stress...   Patient's Medications  New Prescriptions   ALBUTEROL (PROVENTIL HFA;VENTOLIN HFA) 108 (90 BASE) MCG/ACT INHALER    Inhale 1-2 puffs into the lungs every 6 (six) hours as needed for wheezing or shortness of breath.  Previous Medications   ALCOHOL SWABS (B-D SINGLE USE SWABS REGULAR) PADS    Use once daily   ASPIRIN EC 81 MG TABLET    Take 1 tablet (81 mg total) by mouth daily.   BLOOD GLUCOSE MONITORING SUPPL (PRODIGY AUTOCODE BLOOD GLUCOSE) DEVI    Check blood sugar once daily   GLIMEPIRIDE (AMARYL) 2 MG TABLET    Take 1 tablet (2 mg total) by mouth daily with breakfast.   GLUCOSE  BLOOD (ONETOUCH VERIO) TEST STRIP    Text twice daily Dx:E11.59   METFORMIN (GLUCOPHAGE) 500 MG TABLET    TAKE 1 TABLET (500 MG TOTAL) BY MOUTH 2 (TWO) TIMES DAILY WITH A MEAL.   PRODIGY  LANCETS 28G MISC    Use to help check BS twice a day Dx 11.59   SIMVASTATIN (ZOCOR) 40 MG TABLET    Take 1 tablet by mouth  every evening   VITAMIN C (ASCORBIC ACID) 500 MG TABLET    Take 1 tablet (500 mg total) by mouth daily.  Modified Medications   No medications on file  Discontinued Medications   No medications on file

## 2015-10-10 ENCOUNTER — Other Ambulatory Visit: Payer: Self-pay | Admitting: *Deleted

## 2015-10-25 DIAGNOSIS — E785 Hyperlipidemia, unspecified: Secondary | ICD-10-CM | POA: Diagnosis not present

## 2015-10-25 DIAGNOSIS — I700 Atherosclerosis of aorta: Secondary | ICD-10-CM | POA: Diagnosis not present

## 2015-10-25 DIAGNOSIS — Z6835 Body mass index (BMI) 35.0-35.9, adult: Secondary | ICD-10-CM | POA: Diagnosis not present

## 2015-10-25 DIAGNOSIS — Z0000 Encounter for general adult medical examination without abnormal findings: Secondary | ICD-10-CM | POA: Diagnosis not present

## 2015-10-25 DIAGNOSIS — E1169 Type 2 diabetes mellitus with other specified complication: Secondary | ICD-10-CM | POA: Diagnosis not present

## 2015-11-01 ENCOUNTER — Emergency Department (HOSPITAL_COMMUNITY): Payer: Commercial Managed Care - HMO

## 2015-11-01 ENCOUNTER — Encounter (HOSPITAL_COMMUNITY): Payer: Self-pay | Admitting: Emergency Medicine

## 2015-11-01 ENCOUNTER — Emergency Department (HOSPITAL_COMMUNITY)
Admission: EM | Admit: 2015-11-01 | Discharge: 2015-11-01 | Disposition: A | Discharge status: Home / Self Care | Payer: Commercial Managed Care - HMO | Attending: Emergency Medicine | Admitting: Emergency Medicine

## 2015-11-01 DIAGNOSIS — Z8739 Personal history of other diseases of the musculoskeletal system and connective tissue: Secondary | ICD-10-CM | POA: Diagnosis not present

## 2015-11-01 DIAGNOSIS — R011 Cardiac murmur, unspecified: Secondary | ICD-10-CM | POA: Diagnosis not present

## 2015-11-01 DIAGNOSIS — Z87898 Personal history of other specified conditions: Secondary | ICD-10-CM | POA: Diagnosis not present

## 2015-11-01 DIAGNOSIS — Z8719 Personal history of other diseases of the digestive system: Secondary | ICD-10-CM | POA: Diagnosis not present

## 2015-11-01 DIAGNOSIS — R05 Cough: Secondary | ICD-10-CM | POA: Diagnosis not present

## 2015-11-01 DIAGNOSIS — Z8701 Personal history of pneumonia (recurrent): Secondary | ICD-10-CM | POA: Diagnosis not present

## 2015-11-01 DIAGNOSIS — Z79899 Other long term (current) drug therapy: Secondary | ICD-10-CM | POA: Insufficient documentation

## 2015-11-01 DIAGNOSIS — J209 Acute bronchitis, unspecified: Secondary | ICD-10-CM | POA: Diagnosis not present

## 2015-11-01 DIAGNOSIS — E119 Type 2 diabetes mellitus without complications: Secondary | ICD-10-CM | POA: Insufficient documentation

## 2015-11-01 DIAGNOSIS — F17210 Nicotine dependence, cigarettes, uncomplicated: Secondary | ICD-10-CM | POA: Insufficient documentation

## 2015-11-01 DIAGNOSIS — Z8669 Personal history of other diseases of the nervous system and sense organs: Secondary | ICD-10-CM | POA: Diagnosis not present

## 2015-11-01 DIAGNOSIS — Z8679 Personal history of other diseases of the circulatory system: Secondary | ICD-10-CM | POA: Insufficient documentation

## 2015-11-01 DIAGNOSIS — E669 Obesity, unspecified: Secondary | ICD-10-CM | POA: Insufficient documentation

## 2015-11-01 DIAGNOSIS — J40 Bronchitis, not specified as acute or chronic: Secondary | ICD-10-CM

## 2015-11-01 DIAGNOSIS — Z86018 Personal history of other benign neoplasm: Secondary | ICD-10-CM | POA: Diagnosis not present

## 2015-11-01 DIAGNOSIS — Z8659 Personal history of other mental and behavioral disorders: Secondary | ICD-10-CM | POA: Insufficient documentation

## 2015-11-01 DIAGNOSIS — Z87438 Personal history of other diseases of male genital organs: Secondary | ICD-10-CM | POA: Insufficient documentation

## 2015-11-01 DIAGNOSIS — Z7982 Long term (current) use of aspirin: Secondary | ICD-10-CM | POA: Diagnosis not present

## 2015-11-01 DIAGNOSIS — J45909 Unspecified asthma, uncomplicated: Secondary | ICD-10-CM | POA: Diagnosis not present

## 2015-11-01 DIAGNOSIS — Z872 Personal history of diseases of the skin and subcutaneous tissue: Secondary | ICD-10-CM | POA: Diagnosis not present

## 2015-11-01 MED ORDER — AZITHROMYCIN 250 MG PO TABS: ORAL_TABLET | ORAL | Status: DC

## 2015-11-01 MED ORDER — HYDROCOD POLST-CPM POLST ER 10-8 MG/5ML PO SUER
5.0000 mL | Freq: Two times a day (BID) | ORAL | Status: DC | PRN
Start: 2015-11-01 — End: 2015-11-16

## 2015-11-01 NOTE — ED Provider Notes (Signed)
CSN: [id number]     Arrival date & time 11/01/15  [id number] History   First MD Initiated Contact with Patient 11/01/15 [phone]     Chief Complaint  Patient presents with  . Cough     (Consider location/radiation/quality/duration/timing/severity/associated sxs/prior Treatment) Patient is a 74 y.o. male presenting with cough. The history is provided by the patient (Patient complains of cough and congestion for a week. Mild yellow sputum production).  Cough Cough characteristics:  Productive Severity:  Moderate Onset quality:  Sudden Timing:  Constant Progression:  Unchanged Chronicity:  New Context: not animal exposure   Associated symptoms: no chest pain, no eye discharge, no headaches and no rash     Past Medical History  Diagnosis Date  . Unspecified sinusitis (chronic)   . Mitral valve disorders   . Unspecified venous (peripheral) insufficiency   . Type II or unspecified type diabetes mellitus without mention of complication, not stated as uncontrolled   . Obesity, unspecified   . Esophageal reflux   . Diverticulosis of colon (without mention of hemorrhage)   . Irritable bowel syndrome   . Benign neoplasm of colon   . History of BPH   . History of gynecomastia     Unilateral  . DJD (degenerative joint disease)   . Hidradenitis   . MVP (mitral valve prolapse)   . Pneumonia, organism unspecified 2009  . Depression   . Sleep apnea     does not use  cpap or bipap .. no study  ever  . Heart murmur 20 yrs at least.     mitral valve prolapse.   . Allergy   . Asthma     in past, no inhaler now  . Glaucoma   . Hyperlipidemia    Past Surgical History  Procedure Laterality Date  . Knee arthroscopy    . Toe amputation  06/2009    x3Right foot second toe -Dr Beola Cord  . Laser ablation  06/2009    Left Greater Saphenous vein -Dr Donnetta Hutching  . Circumcision  06/2100    For Phimosis - Dr Hartley Barefoot  . Laparotomy  07/20/11  . Colon surgery    . Tonsillectomy    . Amputation  08/09/2012   Procedure: AMPUTATION DIGIT;  Surgeon: Newt Minion, MD;  Location: [place];  Service: Orthopedics;  Laterality: Right;  Amputation right Great Toe MTP Joint  . Amputation  10/17/2012    Procedure: AMPUTATION DIGIT;  Surgeon: Newt Minion, MD;  Location: [place];  Service: Orthopedics;  Laterality: Right;  Right Foot 3rd Toe Amputation at Metatarsophalangeal Joint   Family History  Problem Relation Age of Onset  . Diabetes Father   . Cancer Father     Prostate  . Stroke Father   . Prostate cancer Father   . Colon cancer Neg Hx   . Esophageal cancer Neg Hx   . Rectal cancer Neg Hx   . Stomach cancer Neg Hx    Social History  Substance Use Topics  . Smoking status: Current Every Day Smoker -- 0.50 packs/day for 40 years    Types: Cigarettes  . Smokeless tobacco: Never Used  . Alcohol Use: 4.2 oz/week    7 Cans of beer per week     Comment: not every week     Review of Systems  Constitutional: Negative for appetite change and fatigue.  HENT: Negative for congestion, ear discharge and sinus pressure.   Eyes: Negative for discharge.  Respiratory: Positive for cough.   Cardiovascular:  Negative for chest pain.  Gastrointestinal: Negative for abdominal pain and diarrhea.  Genitourinary: Negative for frequency and hematuria.  Musculoskeletal: Negative for back pain.  Skin: Negative for rash.  Neurological: Negative for seizures and headaches.  Psychiatric/Behavioral: Negative for hallucinations.      Allergies  Review of patient's allergies indicates no known allergies.  Home Medications   Prior to Admission medications   Medication Sig Start Date End Date Taking? Authorizing Provider  albuterol (PROVENTIL HFA;VENTOLIN HFA) 108 (90 BASE) MCG/ACT inhaler Inhale 1-2 puffs into the lungs every 6 (six) hours as needed for wheezing or shortness of breath. 10/08/15  Yes Noralee Space, MD  aspirin EC 81 MG tablet Take 1 tablet (81 mg total) by mouth daily. 11/14/14  Yes [name],  MD  glimepiride (AMARYL) 2 MG tablet Take 1 tablet (2 mg total) by mouth daily with breakfast. 11/15/14  Yes [name], MD  metFORMIN (GLUCOPHAGE) 500 MG tablet TAKE 1 TABLET (500 MG TOTAL) BY MOUTH 2 (TWO) TIMES DAILY WITH A MEAL. 10/01/15  Yes [name], MD  simvastatin (ZOCOR) 40 MG tablet Take 1 tablet by mouth  every evening 11/18/14  Yes [name], MD  vitamin C (ASCORBIC ACID) 500 MG tablet Take 1 tablet (500 mg total) by mouth daily. 11/14/14  Yes [name], MD  azithromycin (ZITHROMAX Z-PAK) 250 MG tablet 2 po day one, then 1 daily x 4 days 11/01/15   Milton Ferguson, MD  chlorpheniramine-HYDROcodone [facility]) 10-8 MG/5ML SUER Take 5 mLs by mouth every 12 (twelve) hours as needed for cough. 11/01/15   Milton Ferguson, MD   BP 144/70 mmHg  Pulse 78  Temp(Src) 98.4 F (36.9 C) (Oral)  Resp 16  SpO2 95% Physical Exam  Constitutional: He is oriented to person, place, and time. He appears well-developed.  HENT:  Head: Normocephalic.  Eyes: Conjunctivae and EOM are normal. No scleral icterus.  Neck: Neck supple. No thyromegaly present.  Cardiovascular: Normal rate and regular rhythm.  Exam reveals no gallop and no friction rub.   No murmur heard. Pulmonary/Chest: No stridor. He has no wheezes. He has no rales. He exhibits no tenderness.  Abdominal: He exhibits no distension. There is no tenderness. There is no rebound.  Musculoskeletal: Normal range of motion. He exhibits no edema.  Lymphadenopathy:    He has no cervical adenopathy.  Neurological: He is oriented to person, place, and time. He exhibits normal muscle tone. Coordination normal.  Skin: No rash noted. No erythema.  Psychiatric: He has a normal mood and affect. His behavior is normal.    ED Course  Procedures (including critical care time) Labs Review Labs Reviewed - No data to display  Imaging Review Dg Chest 2 View  11/01/2015  CLINICAL DATA:  Cough for 3 weeks. Coughing up yellow  sputum. No chest pain. EXAM: CHEST  2 VIEW COMPARISON:  Chest radiograph 09/24/2015; 05/28/2014. FINDINGS: Stable cardiac and mediastinal contours. No large area of pulmonary consolidation. No pleural effusion or pneumothorax. Regional skeleton is unremarkable. IMPRESSION: No active cardiopulmonary disease. Electronically Signed   By: Lovey Newcomer M.D.   On: 11/01/2015 07:47   I have personally reviewed and evaluated these images and lab results as part of my medical decision-making.   EKG Interpretation None      MDM   Final diagnoses:  Bronchitis    Persistent bronchitis we will treat with Z-Pak and Tussionex.  The chart was scribed for me under my  direct supervision.  I personally performed the history, physical, and medical decision making and all procedures in the evaluation of this patient.Milton Ferguson, MD 11/01/15 [phone]

## 2015-11-01 NOTE — Discharge Instructions (Signed)
Follow up with your md if not improving. °

## 2015-11-01 NOTE — ED Notes (Signed)
Patient here with complaints of cough x3 weeks. Productive yellow sputum. Denies chest pain. Past smoker x1 month.

## 2015-11-01 NOTE — ED Notes (Signed)
Patient transported to X-ray 

## 2015-11-05 ENCOUNTER — Emergency Department (HOSPITAL_COMMUNITY): Payer: Commercial Managed Care - HMO

## 2015-11-05 ENCOUNTER — Encounter: Payer: Self-pay | Admitting: Adult Health

## 2015-11-05 ENCOUNTER — Encounter (HOSPITAL_COMMUNITY): Payer: Self-pay | Admitting: *Deleted

## 2015-11-05 ENCOUNTER — Ambulatory Visit (INDEPENDENT_AMBULATORY_CARE_PROVIDER_SITE_OTHER): Payer: Commercial Managed Care - HMO | Admitting: Adult Health

## 2015-11-05 ENCOUNTER — Inpatient Hospital Stay (HOSPITAL_COMMUNITY)
Admission: EM | Admit: 2015-11-05 | Discharge: 2015-11-07 | DRG: 190 | Disposition: A | Discharge status: Home / Self Care | Payer: Commercial Managed Care - HMO | Attending: Family Medicine | Admitting: Family Medicine

## 2015-11-05 VITALS — BP 120/66 | HR 80 | Temp 98.3°F | Ht >= 80 in | Wt 314.0 lb

## 2015-11-05 DIAGNOSIS — E785 Hyperlipidemia, unspecified: Secondary | ICD-10-CM

## 2015-11-05 DIAGNOSIS — J189 Pneumonia, unspecified organism: Secondary | ICD-10-CM | POA: Diagnosis present

## 2015-11-05 DIAGNOSIS — Z79899 Other long term (current) drug therapy: Secondary | ICD-10-CM | POA: Diagnosis not present

## 2015-11-05 DIAGNOSIS — J9601 Acute respiratory failure with hypoxia: Secondary | ICD-10-CM | POA: Diagnosis not present

## 2015-11-05 DIAGNOSIS — J440 Chronic obstructive pulmonary disease with acute lower respiratory infection: Secondary | ICD-10-CM | POA: Diagnosis not present

## 2015-11-05 DIAGNOSIS — I70209 Unspecified atherosclerosis of native arteries of extremities, unspecified extremity: Secondary | ICD-10-CM

## 2015-11-05 DIAGNOSIS — I872 Venous insufficiency (chronic) (peripheral): Secondary | ICD-10-CM | POA: Diagnosis not present

## 2015-11-05 DIAGNOSIS — E119 Type 2 diabetes mellitus without complications: Secondary | ICD-10-CM | POA: Diagnosis not present

## 2015-11-05 DIAGNOSIS — I7090 Unspecified atherosclerosis: Secondary | ICD-10-CM | POA: Diagnosis not present

## 2015-11-05 DIAGNOSIS — J449 Chronic obstructive pulmonary disease, unspecified: Secondary | ICD-10-CM

## 2015-11-05 DIAGNOSIS — H409 Unspecified glaucoma: Secondary | ICD-10-CM | POA: Diagnosis present

## 2015-11-05 DIAGNOSIS — E669 Obesity, unspecified: Secondary | ICD-10-CM | POA: Diagnosis present

## 2015-11-05 DIAGNOSIS — I341 Nonrheumatic mitral (valve) prolapse: Secondary | ICD-10-CM | POA: Diagnosis present

## 2015-11-05 DIAGNOSIS — J181 Lobar pneumonia, unspecified organism: Secondary | ICD-10-CM

## 2015-11-05 DIAGNOSIS — Z87891 Personal history of nicotine dependence: Secondary | ICD-10-CM

## 2015-11-05 DIAGNOSIS — J441 Chronic obstructive pulmonary disease with (acute) exacerbation: Secondary | ICD-10-CM | POA: Diagnosis present

## 2015-11-05 DIAGNOSIS — E784 Other hyperlipidemia: Secondary | ICD-10-CM | POA: Diagnosis not present

## 2015-11-05 DIAGNOSIS — K5730 Diverticulosis of large intestine without perforation or abscess without bleeding: Secondary | ICD-10-CM | POA: Diagnosis not present

## 2015-11-05 DIAGNOSIS — Z89421 Acquired absence of other right toe(s): Secondary | ICD-10-CM | POA: Diagnosis not present

## 2015-11-05 DIAGNOSIS — Z6833 Body mass index (BMI) 33.0-33.9, adult: Secondary | ICD-10-CM | POA: Diagnosis not present

## 2015-11-05 DIAGNOSIS — R05 Cough: Secondary | ICD-10-CM | POA: Diagnosis not present

## 2015-11-05 DIAGNOSIS — K219 Gastro-esophageal reflux disease without esophagitis: Secondary | ICD-10-CM | POA: Diagnosis present

## 2015-11-05 DIAGNOSIS — M1990 Unspecified osteoarthritis, unspecified site: Secondary | ICD-10-CM | POA: Diagnosis present

## 2015-11-05 DIAGNOSIS — Z7984 Long term (current) use of oral hypoglycemic drugs: Secondary | ICD-10-CM | POA: Diagnosis not present

## 2015-11-05 DIAGNOSIS — Z7982 Long term (current) use of aspirin: Secondary | ICD-10-CM

## 2015-11-05 DIAGNOSIS — E1151 Type 2 diabetes mellitus with diabetic peripheral angiopathy without gangrene: Secondary | ICD-10-CM

## 2015-11-05 LAB — COMPREHENSIVE METABOLIC PANEL
ALK PHOS: 44 U/L (ref 38–126)
ALT: 30 U/L (ref 17–63)
ANION GAP: 8 (ref 5–15)
AST: 22 U/L (ref 15–41)
Albumin: 4.3 g/dL (ref 3.5–5.0)
BUN: 10 mg/dL (ref 6–20)
CALCIUM: 9 mg/dL (ref 8.9–10.3)
CO2: 32 mmol/L (ref 22–32)
Chloride: 101 mmol/L (ref 101–111)
Creatinine, Ser: 0.82 mg/dL (ref 0.61–1.24)
GFR calc non Af Amer: >60 mL/min (ref >60)
Glucose, Bld: 167 mg/dL — ABNORMAL HIGH (ref 65–99)
Potassium: 4.3 mmol/L (ref 3.5–5.1)
SODIUM: 141 mmol/L (ref 135–145)
Total Bilirubin: 0.9 mg/dL (ref 0.3–1.2)
Total Protein: 7.9 g/dL (ref 6.5–8.1)

## 2015-11-05 LAB — CBC WITH DIFFERENTIAL/PLATELET
Basophils Absolute: 0.1 10*3/uL (ref 0.0–0.1)
Basophils Relative: 1 %
EOS ABS: 1 10*3/uL — AB (ref 0.0–0.7)
EOS PCT: 15 %
HCT: 43.5 % (ref 39.0–52.0)
HEMOGLOBIN: 14.2 g/dL (ref 13.0–17.0)
LYMPHS ABS: 0.6 10*3/uL — AB (ref 0.7–4.0)
Lymphocytes Relative: 9 %
MCH: 31.2 pg (ref 26.0–34.0)
MCHC: 32.6 g/dL (ref 30.0–36.0)
MCV: 95.6 fL (ref 78.0–100.0)
MONO ABS: 0.4 10*3/uL (ref 0.1–1.0)
MONOS PCT: 6 %
Neutro Abs: 4.9 10*3/uL (ref 1.7–7.7)
Neutrophils Relative %: 71 %
PLATELETS: 146 10*3/uL — AB (ref 150–400)
RBC: 4.55 MIL/uL (ref 4.22–5.81)
RDW: 12.1 % (ref 11.5–15.5)
WBC: 6.9 10*3/uL (ref 4.0–10.5)

## 2015-11-05 LAB — I-STAT CG4 LACTIC ACID, ED
LACTIC ACID, VENOUS: 1.5 mmol/L (ref 0.5–2.0)
Lactic Acid, Venous: 1.2 mmol/L (ref 0.5–2.0)

## 2015-11-05 LAB — GLUCOSE, CAPILLARY: Glucose-Capillary: 219 mg/dL — ABNORMAL HIGH (ref 65–99)

## 2015-11-05 MED ORDER — ALBUTEROL SULFATE (2.5 MG/3ML) 0.083% IN NEBU
3.0000 mL | INHALATION_SOLUTION | Freq: Four times a day (QID) | RESPIRATORY_TRACT | Status: DC | PRN
  Administered 2015-11-06: 3 mL via RESPIRATORY_TRACT
  Filled 2015-11-05: qty 3

## 2015-11-05 MED ORDER — METFORMIN HCL 500 MG PO TABS
500.0000 mg | ORAL_TABLET | Freq: Two times a day (BID) | ORAL | Status: DC
  Administered 2015-11-05 – 2015-11-07 (×4): 500 mg via ORAL
  Filled 2015-11-05 (×5): qty 1

## 2015-11-05 MED ORDER — METHYLPREDNISOLONE SODIUM SUCC 125 MG IJ SOLR
80.0000 mg | Freq: Two times a day (BID) | INTRAMUSCULAR | Status: DC
  Administered 2015-11-06: 80 mg via INTRAVENOUS
  Filled 2015-11-05 (×2): qty 1.28

## 2015-11-05 MED ORDER — LEVOFLOXACIN IN D5W 750 MG/150ML IV SOLN
750.0000 mg | Freq: Once | INTRAVENOUS | Status: AC
  Administered 2015-11-05: 750 mg via INTRAVENOUS
  Filled 2015-11-05: qty 150

## 2015-11-05 MED ORDER — ALBUTEROL (5 MG/ML) CONTINUOUS INHALATION SOLN
10.0000 mg/h | INHALATION_SOLUTION | RESPIRATORY_TRACT | Status: AC
  Administered 2015-11-05: 10 mg/h via RESPIRATORY_TRACT
  Filled 2015-11-05: qty 20

## 2015-11-05 MED ORDER — METHYLPREDNISOLONE SODIUM SUCC 125 MG IJ SOLR
125.0000 mg | Freq: Once | INTRAMUSCULAR | Status: AC
  Administered 2015-11-05: 125 mg via INTRAVENOUS
  Filled 2015-11-05: qty 2

## 2015-11-05 MED ORDER — ENOXAPARIN SODIUM 40 MG/0.4ML ~~LOC~~ SOLN
40.0000 mg | SUBCUTANEOUS | Status: DC
  Administered 2015-11-05 – 2015-11-06 (×2): 40 mg via SUBCUTANEOUS
  Filled 2015-11-05 (×3): qty 0.4

## 2015-11-05 MED ORDER — HYDROCOD POLST-CPM POLST ER 10-8 MG/5ML PO SUER
5.0000 mL | Freq: Two times a day (BID) | ORAL | Status: DC | PRN
  Administered 2015-11-07: 5 mL via ORAL
  Filled 2015-11-05: qty 5

## 2015-11-05 MED ORDER — IPRATROPIUM BROMIDE 0.02 % IN SOLN
0.5000 mg | RESPIRATORY_TRACT | Status: AC
  Administered 2015-11-05: 0.5 mg via RESPIRATORY_TRACT
  Filled 2015-11-05: qty 2.5

## 2015-11-05 MED ORDER — LEVOFLOXACIN 750 MG PO TABS
750.0000 mg | ORAL_TABLET | ORAL | Status: DC
  Administered 2015-11-06 – 2015-11-07 (×2): 750 mg via ORAL
  Filled 2015-11-05 (×2): qty 1

## 2015-11-05 NOTE — ED Notes (Signed)
Pt was at pulmonologist today and sent to ED. Pt reports 2 weeks of productive cough with thick yellow mucus, wheezing, shortness of breath, chest tightness. Denies pain. Audible wheezing heard.   md at bedside

## 2015-11-05 NOTE — ED Notes (Signed)
Spoke with Wells Guiles NS on 4E. 20 min start at 1456. Adela Lank RN aware

## 2015-11-05 NOTE — Assessment & Plan Note (Signed)
Slow to resolve exacerbation  Case discussed with Dr. Lenna Gilford  , recommended to go to ER for evaluation and consideration for admission via [place] .   Plan  Send to ER .

## 2015-11-05 NOTE — Progress Notes (Signed)
Subjective:    Patient ID: Jason [name], male    DOB: 09-21-1941, 75 y.o.   MRN: [id number]  HPI 74 yo male former smoker with history of bronchitis, sinusitis, diabetes, hyperlipidemia  11/05/2015 Acute OV  Patient presented for an acute office visit. Patient complains of 2 weeks of productive cough with thick yellow mucus, wheezing, shortness of breath, chest tightness. Patient was seen in the emergency room on December 17 with acute bronchitis. Chest x-ray showed no sign of pneumonia.patient was given a Z-Pak without any improvement. Patient says that he has continued to get worse with progressively worsening shortness of breath, wheezing and fatigue. he says he's not been able to eat for the last 24 hours.. He denies any hemoptysis, orthopnea, PND, or increased leg swelling. Patient says he is very weak in the office.     Past Medical History  Diagnosis Date  . Unspecified sinusitis (chronic)   . Mitral valve disorders   . Unspecified venous (peripheral) insufficiency   . Type II or unspecified type diabetes mellitus without mention of complication, not stated as uncontrolled   . Obesity, unspecified   . Esophageal reflux   . Diverticulosis of colon (without mention of hemorrhage)   . Irritable bowel syndrome   . Benign neoplasm of colon   . History of BPH   . History of gynecomastia     Unilateral  . DJD (degenerative joint disease)   . Hidradenitis   . MVP (mitral valve prolapse)   . Pneumonia, organism unspecified 2009  . Depression   . Sleep apnea     does not use  cpap or bipap .. no study  ever  . Heart murmur 20 yrs at least.     mitral valve prolapse.   . Allergy   . Asthma     in past, no inhaler now  . Glaucoma   . Hyperlipidemia    Current Outpatient Prescriptions on File Prior to Visit  Medication Sig Dispense Refill  . albuterol (PROVENTIL HFA;VENTOLIN HFA) 108 (90 BASE) MCG/ACT inhaler Inhale 1-2 puffs into the lungs every 6 (six) hours as needed  for wheezing or shortness of breath. 3 Inhaler 2  . aspirin EC 81 MG tablet Take 1 tablet (81 mg total) by mouth daily. 90 tablet 3  . chlorpheniramine-HYDROcodone (TUSSIONEX PENNKINETIC ER) 10-8 MG/5ML SUER Take 5 mLs by mouth every 12 (twelve) hours as needed for cough. 115 mL 0  . glimepiride (AMARYL) 2 MG tablet Take 1 tablet (2 mg total) by mouth daily with breakfast. 90 tablet 1  . metFORMIN (GLUCOPHAGE) 500 MG tablet TAKE 1 TABLET (500 MG TOTAL) BY MOUTH 2 (TWO) TIMES DAILY WITH A MEAL. 180 tablet 1  . simvastatin (ZOCOR) 40 MG tablet Take 1 tablet by mouth  every evening 90 tablet 3  . vitamin C (ASCORBIC ACID) 500 MG tablet Take 1 tablet (500 mg total) by mouth daily. 90 tablet 3   No current facility-administered medications on file prior to visit.      Review of Systems Constitutional:   No  weight loss, night sweats,  Fevers, chills,  +fatigue, or  lassitude.  HEENT:   No headaches,  Difficulty swallowing,  Tooth/dental problems, or  Sore throat,                No sneezing, itching, ear ache,  +nasal congestion, post nasal drip,   CV:  No chest pain,  Orthopnea, PND, swelling in lower extremities, anasarca, dizziness, palpitations, syncope.  GI  No heartburn, indigestion, abdominal pain, nausea, vomiting, diarrhea, change in bowel habits, loss of appetite, bloody stools.   Resp:   No chest wall deformity  Skin: no rash or lesions.  GU: no dysuria, change in color of urine, no urgency or frequency.  No flank pain, no hematuria   MS:  No joint pain or swelling.  No decreased range of motion.  No back pain.  Psych:  No change in mood or affect. No depression or anxiety.  No memory loss.         Objective:   Physical Exam Filed Vitals:   11/05/15 1120  BP: 120/66  Pulse: 80  Temp: 98.3 F (36.8 C)  TempSrc: Oral  Height: 6\' 8"  (2.032 m)  Weight: 314 lb (142.429 kg)  SpO2: 93%     GEN: A/Ox3; pleasant , obese ill appearing in wc   HEENT:  [place]/AT,   EACs-clear, TMs-wnl, NOSE-clear, THROAT-clear, no lesions, no postnasal drip or exudate noted.   NECK:  Supple w/ fair ROM; no JVD; normal carotid impulses w/o bruits; no thyromegaly or nodules palpated; no lymphadenopathy.  RESP  Exp wheezing , +psuedowheeizng no accessory muscle use, no dullness to percussion  CARD:  RRR, no m/r/g  , tr -1 +  peripheral edema, pulses intact, no cyanosis or clubbing.  GI:   Soft & nt; nml bowel sounds; no organomegaly or masses detected.  Musco: Warm bil, no deformities or joint swelling noted.   Neuro: alert, no focal deficits noted.    Skin: Warm, no lesions or rashes        Assessment & Plan:

## 2015-11-05 NOTE — ED Notes (Signed)
No IV pump available. ED secretary called portable for Iv pumps and channels.

## 2015-11-05 NOTE — Progress Notes (Addendum)
PHARMACY NOTE -  levofloxacin  Pharmacy has been assisting with dosing of levofloxacin for CAP. Dosage remains stable at levofloxacin 750 mg daily x 5 days  and need for further dosage adjustment appears unlikely at present.    Will sign off at this time.  Please reconsult if a change in clinical status warrants re-evaluation of dosage.  Royetta Asal, PharmD, BCPS Pager [phone] 11/05/2015 5:15 PM

## 2015-11-05 NOTE — ED Provider Notes (Signed)
CSN: [id number]     Arrival date & time 11/05/15  1218 History   First MD Initiated Contact with Patient 11/05/15 1229     Chief Complaint  Patient presents with  . Shortness of Breath     (Consider location/radiation/quality/duration/timing/severity/associated sxs/prior Treatment) HPI Comments: The patient is a 74 year old male, he has recently diagnosed COPD, he presents from the pulmonary clinic with severe shortness of breath, ongoing wheezing, productive cough, some hemoptysis, has been going on for 2 weeks. There is sputum production, no fevers chills or swelling of the legs. This is gradually worsening, nothing seems to make this better or worse, was sent from the clinic for admission due to severe respiratory distress  Patient is a 74 y.o. male presenting with shortness of breath. The history is provided by the patient.  Shortness of Breath   Past Medical History  Diagnosis Date  . Unspecified sinusitis (chronic)   . Mitral valve disorders   . Unspecified venous (peripheral) insufficiency   . Type II or unspecified type diabetes mellitus without mention of complication, not stated as uncontrolled   . Obesity, unspecified   . Esophageal reflux   . Diverticulosis of colon (without mention of hemorrhage)   . Irritable bowel syndrome   . Benign neoplasm of colon   . History of BPH   . History of gynecomastia     Unilateral  . DJD (degenerative joint disease)   . Hidradenitis   . MVP (mitral valve prolapse)   . Pneumonia, organism unspecified 2009  . Depression   . Sleep apnea     does not use  cpap or bipap .. no study  ever  . Heart murmur 20 yrs at least.     mitral valve prolapse.   . Allergy   . Asthma     in past, no inhaler now  . Glaucoma   . Hyperlipidemia    Past Surgical History  Procedure Laterality Date  . Knee arthroscopy    . Toe amputation  06/2009    x3Right foot second toe -Dr Beola Cord  . Laser ablation  06/2009    Left Greater Saphenous vein -Dr  Donnetta Hutching  . Circumcision  06/2100    For Phimosis - Dr Hartley Barefoot  . Laparotomy  07/20/11  . Colon surgery    . Tonsillectomy    . Amputation  08/09/2012    Procedure: AMPUTATION DIGIT;  Surgeon: Newt Minion, MD;  Location: [place];  Service: Orthopedics;  Laterality: Right;  Amputation right Great Toe MTP Joint  . Amputation  10/17/2012    Procedure: AMPUTATION DIGIT;  Surgeon: Newt Minion, MD;  Location: [place];  Service: Orthopedics;  Laterality: Right;  Right Foot 3rd Toe Amputation at Metatarsophalangeal Joint   Family History  Problem Relation Age of Onset  . Diabetes Father   . Cancer Father     Prostate  . Stroke Father   . Prostate cancer Father   . Colon cancer Neg Hx   . Esophageal cancer Neg Hx   . Rectal cancer Neg Hx   . Stomach cancer Neg Hx    Social History  Substance Use Topics  . Smoking status: Former Smoker -- 0.50 packs/day for 40 years    Types: Cigarettes    Quit date: 09/05/2015  . Smokeless tobacco: Never Used  . Alcohol Use: 4.2 oz/week    7 Cans of beer per week     Comment: not every week     Review of Systems  Respiratory: Positive for shortness of breath.   All other systems reviewed and are negative.     Allergies  Review of patient's allergies indicates no known allergies.  Home Medications   Prior to Admission medications   Medication Sig Start Date End Date Taking? Authorizing Provider  albuterol (PROVENTIL HFA;VENTOLIN HFA) 108 (90 BASE) MCG/ACT inhaler Inhale 1-2 puffs into the lungs every 6 (six) hours as needed for wheezing or shortness of breath. 10/08/15   Noralee Space, MD  aspirin EC 81 MG tablet Take 1 tablet (81 mg total) by mouth daily. 11/14/14   [name], MD  chlorpheniramine-HYDROcodone [facility] PENNKINETIC ER) 10-8 MG/5ML SUER Take 5 mLs by mouth every 12 (twelve) hours as needed for cough. 11/01/15   Milton Ferguson, MD  glimepiride (AMARYL) 2 MG tablet Take 1 tablet (2 mg total) by mouth daily with breakfast. 11/15/14    [name], MD  metFORMIN (GLUCOPHAGE) 500 MG tablet TAKE 1 TABLET (500 MG TOTAL) BY MOUTH 2 (TWO) TIMES DAILY WITH A MEAL. 10/01/15   [name], MD  simvastatin (ZOCOR) 40 MG tablet Take 1 tablet by mouth  every evening 11/18/14   [name], MD  vitamin C (ASCORBIC ACID) 500 MG tablet Take 1 tablet (500 mg total) by mouth daily. 11/14/14   [name], MD   BP 153/78 mmHg  Pulse 102  Temp(Src) 98.3 F (36.8 C) (Oral)  Resp 28  SpO2 95% Physical Exam  Constitutional: He appears well-developed and well-nourished. He appears distressed.  HENT:  Head: Normocephalic and atraumatic.  Mouth/Throat: Oropharynx is clear and moist. No oropharyngeal exudate.  Eyes: Conjunctivae and EOM are normal. Pupils are equal, round, and reactive to light. Right eye exhibits no discharge. Left eye exhibits no discharge. No scleral icterus.  Neck: Normal range of motion. Neck supple. No JVD present. No thyromegaly present.  Cardiovascular: Regular rhythm, normal heart sounds and intact distal pulses.  Exam reveals no gallop and no friction rub.   No murmur heard. Tachycardia to 102 bpm  Pulmonary/Chest: He is in respiratory distress. He has wheezes. He has no rales.  Abdominal: Soft. Bowel sounds are normal. He exhibits no distension and no mass. There is no tenderness.  Musculoskeletal: Normal range of motion. He exhibits no edema or tenderness.  Lymphadenopathy:    He has no cervical adenopathy.  Neurological: He is alert. Coordination normal.  Skin: Skin is warm and dry. No rash noted. No erythema.  Psychiatric: He has a normal mood and affect. His behavior is normal.  Nursing note and vitals reviewed.   ED Course  Procedures (including critical care time) Labs Review Labs Reviewed  CULTURE, BLOOD (ROUTINE X 2)  CULTURE, BLOOD (ROUTINE X 2)  CBC WITH DIFFERENTIAL/PLATELET  COMPREHENSIVE METABOLIC PANEL  I-STAT CG4 LACTIC ACID, ED    Imaging Review No results found. I have  personally reviewed and evaluated these images and lab results as part of my medical decision-making.   EKG Interpretation None      MDM   Final diagnoses:  PNA (pneumonia)    The patient has accessory muscle use, increased work of breathing, speaks in shortened sentences, hypoxia, on 2 L he is at 88%. We'll obtain stat chest x-ray, labs, continuous nebulizer and Solu-Medrol, he will need admission to the hospital. Pneumonia versus worsening COPD, less likely pulmonary embolism given the prodrome of infectious symptoms.  PNA present on xray, meds given, pt to be admitted - d/w hospitalist who gve  request for holding orders.  Meds given in ED:  Medications  albuterol (PROVENTIL,VENTOLIN) solution continuous neb (not administered)  ipratropium (ATROVENT) nebulizer solution 0.5 mg (not administered)  methylPREDNISolone sodium succinate (SOLU-MEDROL) 125 mg/2 mL injection 125 mg (not administered)    New Prescriptions   No medications on file        Noemi Chapel, MD 11/05/15 2150

## 2015-11-05 NOTE — H&P (Signed)
[facility] History and Physical  Jason [name] [id number] DOB: 14-Apr-1941 DOA: 11/05/2015  Referring physician: ED PCP: Scarlette Calico, MD  Specialists: none  Chief Complaint: cough SOB  74 y/o ? SBO s/p Adhesiolysis 07/2011 Chr Venous insufficiency with normal ABIs 2006 Mitral valve prolapse Hyperlipidemia Medication controlled type 2 diabetes mellitus Morbid obesity Degenerative joint disease Prior smoker Diverticulosis last colonoscopy 7/16 mild diverticulitis internal hemorrhoids and polyps showing tubular adenomas Hidradenitis  Recent visit to emergency room 12/17 for bronchitis treated with Z-Pak 7 Tussionex went to visit pulmonology and was sent to the emergency room by them given his frailty and increased work of breathing  Noted to have sats in the 80s initially and a new oxygen requirement Chest x-ray revealed pneumonia possibly on the left side which is new for him  Otherwise labs are normal  Patient himself has been feeling well overall and quit smoking after about 30 years in October He had recently restarted 2015 and had quit before since 1973 when his son was born  No rash, no ill contacts, + + sputum green No chest pain No blurred vision No double vision No unilateral weakness No sick contacts No diarrhea No constipation Falls Very active  Completed [employer] did multiple odd jobs Married since 1972 Occasional drinker only  Father used to drink heavily Mother is ill and is at age 80 and hospice is being called in  No known drug allergies   Past Medical History  Diagnosis Date  . Unspecified sinusitis (chronic)   . Mitral valve disorders   . Unspecified venous (peripheral) insufficiency   . Type II or unspecified type diabetes mellitus without mention of complication, not stated as uncontrolled   . Obesity, unspecified   . Esophageal reflux   . Diverticulosis of colon (without mention of hemorrhage)   . Irritable bowel  syndrome   . Benign neoplasm of colon   . History of BPH   . History of gynecomastia     Unilateral  . DJD (degenerative joint disease)   . Hidradenitis   . MVP (mitral valve prolapse)   . Pneumonia, organism unspecified 2009  . Depression   . Sleep apnea     does not use  cpap or bipap .. no study  ever  . Heart murmur 20 yrs at least.     mitral valve prolapse.   . Allergy   . Asthma     in past, no inhaler now  . Glaucoma   . Hyperlipidemia    Past Surgical History  Procedure Laterality Date  . Knee arthroscopy    . Toe amputation  06/2009    x3Right foot second toe -Dr Beola Cord  . Laser ablation  06/2009    Left Greater Saphenous vein -Dr Donnetta Hutching  . Circumcision  06/2100    For Phimosis - Dr Hartley Barefoot  . Laparotomy  07/20/11  . Colon surgery    . Tonsillectomy    . Amputation  08/09/2012    Procedure: AMPUTATION DIGIT;  Surgeon: Newt Minion, MD;  Location: [place];  Service: Orthopedics;  Laterality: Right;  Amputation right Great Toe MTP Joint  . Amputation  10/17/2012    Procedure: AMPUTATION DIGIT;  Surgeon: Newt Minion, MD;  Location: [place];  Service: Orthopedics;  Laterality: Right;  Right Foot 3rd Toe Amputation at Metatarsophalangeal Joint   Social History:  Social History   Social History Narrative    No Known Allergies  Family History  Problem Relation Age of Onset  . Diabetes Father   . Cancer Father     Prostate  . Stroke Father   . Prostate cancer Father   . Colon cancer Neg Hx   . Esophageal cancer Neg Hx   . Rectal cancer Neg Hx   . Stomach cancer Neg Hx     Prior to Admission medications   Medication Sig Start Date End Date Taking? Authorizing Provider  albuterol (PROVENTIL HFA;VENTOLIN HFA) 108 (90 BASE) MCG/ACT inhaler Inhale 1-2 puffs into the lungs every 6 (six) hours as needed for wheezing or shortness of breath. 10/08/15  Yes Noralee Space, MD  aspirin EC 81 MG tablet Take 1 tablet (81 mg total) by mouth daily. 11/14/14  Yes [name], MD  chlorpheniramine-HYDROcodone [facility] PENNKINETIC ER) 10-8 MG/5ML SUER Take 5 mLs by mouth every 12 (twelve) hours as needed for cough. 11/01/15  Yes Milton Ferguson, MD  glimepiride (AMARYL) 2 MG tablet Take 1 tablet (2 mg total) by mouth daily with breakfast. 11/15/14  Yes [name], MD  metFORMIN (GLUCOPHAGE) 500 MG tablet TAKE 1 TABLET (500 MG TOTAL) BY MOUTH 2 (TWO) TIMES DAILY WITH A MEAL. 10/01/15  Yes [name], MD  simvastatin (ZOCOR) 40 MG tablet Take 1 tablet by mouth  every evening 11/18/14  Yes [name], MD  vitamin C (ASCORBIC ACID) 500 MG tablet Take 1 tablet (500 mg total) by mouth daily. 11/14/14  Yes [name], MD   Physical Exam: Filed Vitals:   11/05/15 1233 11/05/15 1234 11/05/15 1235 11/05/15 1309  BP:      Pulse:   102   Temp:      TempSrc:      Resp:   28   SpO2: 95% 88% 95% 95%   EOMI NCAT no Mallampati, throat appears red Sinuses are nontender No JVD No bruit S1-S2 no murmur rub or gallop Wheezy throughout Slightly diminished lung sounds on the right side no egophony Trachea midline Trace lower extremity edema but does have stockings on Abdomen soft nontender nondistended Neurologically intact      Labs on Admission:  Basic Metabolic Panel:  Recent Labs Lab 11/05/15 1254  NA 141  K 4.3  CL 101  CO2 32  GLUCOSE 167*  BUN 10  CREATININE 0.82  CALCIUM 9.0   Liver Function Tests:  Recent Labs Lab 11/05/15 1254  AST 22  ALT 30  ALKPHOS 44  BILITOT 0.9  PROT 7.9  ALBUMIN 4.3   No results for input(s): LIPASE, AMYLASE in the last 168 hours. No results for input(s): AMMONIA in the last 168 hours. CBC:  Recent Labs Lab 11/05/15 1254  WBC 6.9  NEUTROABS 4.9  HGB 14.2  HCT 43.5  MCV 95.6  PLT 146*   Cardiac Enzymes: No results for input(s): CKTOTAL, CKMB, CKMBINDEX, TROPONINI in the last 168 hours.  BNP (last 3 results) No results for input(s): BNP in the last 8760 hours.  ProBNP (last 3  results) No results for input(s): PROBNP in the last 8760 hours.  CBG: No results for input(s): GLUCAP in the last 168 hours.  Radiological Exams on Admission: Dg Chest Port 1 View  11/05/2015  CLINICAL DATA:  Two weeks productive cough, wheezing, shortness of breath, chest tightness. EXAM: PORTABLE CHEST 1 VIEW COMPARISON:  11/01/2015 FINDINGS: Heart is normal size. Increased density at the left lung base could reflect area of pneumonia. Right lung is clear. No effusions. No acute bony  abnormality. IMPRESSION: Left basilar airspace opacity could reflect area of pneumonia. Electronically Signed   By: Rolm Baptise M.D.   On: 11/05/2015 13:11    EKG: Independently reviewed. nsr  Assessment/Plan   Acute hypoxic respiratory failure possibly secondary to below Keep on telemetry and oxygen for now Desats screen in the a.m.  Community-acquired pneumonia Probably secondary to bronchitis. Would still check for flu. Cover with Levaquin 750 daily Chest x-ray two-view a.m. to confirm presence Lactic acid is not elevated and as patient is able to eat and drink would allow diet Stable for telemetry  Acute exacerbation COPD Usually does not use nebulization has had a continuous neb Place on when necessary nebulizations every 4 when necessary DuoNeb, burst with steroids 80 mg IV twice a day tomorrow and hopeful to taper to orals eventually  Type 2 diabetes mellitus on oral medications Hold off on Amaryl for now Okay to continue metformin 500 twice a day Sliding scale if over 200 blood sugar  Mitral valve prolapse-stable at this time  Chronic venous insufficiency continue TED hose Will still need to have Lovenox  History diverticulosis-currently stable  Hyperlipidemia-hold statin and aspirin for  Full code 45 minutes  Verneita Griffes, MD [facility] [phone]

## 2015-11-05 NOTE — Patient Instructions (Addendum)
Go to ER

## 2015-11-06 ENCOUNTER — Inpatient Hospital Stay (HOSPITAL_COMMUNITY): Payer: Commercial Managed Care - HMO

## 2015-11-06 LAB — GLUCOSE, CAPILLARY
GLUCOSE-CAPILLARY: 254 mg/dL — AB (ref 65–99)
Glucose-Capillary: 281 mg/dL — ABNORMAL HIGH (ref 65–99)
Glucose-Capillary: 284 mg/dL — ABNORMAL HIGH (ref 65–99)

## 2015-11-06 LAB — INFLUENZA PANEL BY PCR (TYPE A & B)
H1N1FLUPCR: NOT DETECTED
INFLAPCR: NEGATIVE
INFLBPCR: NEGATIVE

## 2015-11-06 MED ORDER — INSULIN ASPART 100 UNIT/ML ~~LOC~~ SOLN
0.0000 [IU] | Freq: Three times a day (TID) | SUBCUTANEOUS | Status: DC
  Administered 2015-11-06 (×2): 8 [IU] via SUBCUTANEOUS
  Administered 2015-11-07: 3 [IU] via SUBCUTANEOUS

## 2015-11-06 MED ORDER — AEROCHAMBER PLUS FLO-VU LARGE MISC
1.0000 | Freq: Once | Status: AC
  Administered 2015-11-06: 1
  Filled 2015-11-06: qty 1

## 2015-11-06 MED ORDER — PREDNISONE 50 MG PO TABS
60.0000 mg | ORAL_TABLET | Freq: Every day | ORAL | Status: DC
  Administered 2015-11-06 – 2015-11-07 (×2): 60 mg via ORAL
  Filled 2015-11-06 (×2): qty 1

## 2015-11-06 MED ORDER — ALBUTEROL SULFATE HFA 108 (90 BASE) MCG/ACT IN AERS
2.0000 | INHALATION_SPRAY | RESPIRATORY_TRACT | Status: DC
  Administered 2015-11-06 – 2015-11-07 (×6): 2 via RESPIRATORY_TRACT
  Filled 2015-11-06: qty 6.7

## 2015-11-06 MED ORDER — INSULIN ASPART 100 UNIT/ML ~~LOC~~ SOLN
3.0000 [IU] | Freq: Three times a day (TID) | SUBCUTANEOUS | Status: DC
  Administered 2015-11-06 – 2015-11-07 (×3): 3 [IU] via SUBCUTANEOUS

## 2015-11-06 NOTE — Evaluation (Signed)
Physical Therapy Evaluation-1x [name] MRN: [id number] DOB: 05-08-1941 Today's Date: 11/06/2015   History of Present Illness  74 yo male admitted with Pna. Hx of obesity, DM, sleep apnea, DJD, R foot toe(s) amputated.   Clinical Impression  On eval, pt was Mod Ind with mobility-walked ~250 feet. O2 sats 94% on RA. No acute PT needs. Will sign off.     Follow Up Recommendations No PT follow up    Equipment Recommendations  None recommended by PT    Recommendations for Other Services       Precautions / Restrictions Precautions Precautions: None Restrictions Weight Bearing Restrictions: No      Mobility  Bed Mobility Overal bed mobility: Independent             General bed mobility comments: oob in recliner  Transfers Overall transfer level: Needs assistance   Transfers: Sit to/from Stand;Stand Pivot Transfers Sit to Stand: Independent Stand pivot transfers: Independent          Ambulation/Gait Ambulation/Gait assistance: Modified independent (Device/Increase time) Ambulation Distance (Feet): 250 Feet Assistive device: None Gait Pattern/deviations: Step-through pattern;Decreased stride length     General Gait Details: O2 sats 94% on RA  Stairs            Wheelchair Mobility    Modified Rankin (Stroke Patients Only)       Balance                                             Pertinent Vitals/Pain Pain Assessment: No/denies pain    Home Living Family/patient expects to be discharged to:: Private residence Living Arrangements: Spouse/significant other;Other relatives Available Help at Discharge: Family Type of Home: House                Prior Function Level of Independence: Independent               Hand Dominance        Extremity/Trunk Assessment   Upper Extremity Assessment: Defer to OT evaluation           Lower Extremity Assessment: Overall WFL for tasks  assessed      Cervical / Trunk Assessment: Normal  Communication   Communication: No difficulties  Cognition Arousal/Alertness: Awake/alert Behavior During Therapy: WFL for tasks assessed/performed Overall Cognitive Status: Within Functional Limits for tasks assessed                      General Comments      Exercises        Assessment/Plan    PT Assessment Patent does not need any further PT services  PT Diagnosis     PT Problem List    PT Treatment Interventions     PT Goals (Current goals can be found in the Care Plan section) Acute Rehab PT Goals Patient Stated Goal: home soon PT Goal Formulation: All assessment and education complete, DC therapy    Frequency     Barriers to discharge        Co-evaluation               End of Session   Activity Tolerance: Patient tolerated treatment well Patient left: in chair;with call bell/phone within reach           Time: 1450-1459 PT Time Calculation (min) (ACUTE ONLY): 9 min  Charges:   PT Evaluation $Initial PT Evaluation Tier I: 1 Procedure     PT G Codes:        Weston Anna, MPT Pager: [phone]

## 2015-11-06 NOTE — Progress Notes (Signed)
Jason [name] [id number] DOB: [birth year] DOA: 11/05/2015 PCP: Scarlette Calico, MD  Brief narrative: 74 y/o ? SBO s/p Adhesiolysis 07/2011 Chr Venous insufficiency with normal ABIs 2006 Mitral valve prolapse Hyperlipidemia Medication controlled type 2 diabetes mellitus Morbid obesity Degenerative joint disease Prior smoker Diverticulosis last colonoscopy 7/16 mild diverticulitis internal hemorrhoids and polyps showing tubular adenomas Hidradenitis  Recent visit to emergency room 12/17 for bronchitis treated with Z-Pak 7 Tussionex   Found on work-up to have CAP  Past medical history-As per Problem list Chart reviewed as below-   Consultants:  none  Procedures:  none  Antibiotics:  Levaquin 750 daily 12/21   Subjective  Doing fair Not completely back to normal Tolerating diet She denies nausea, vomiting, chest pain Some sputum   Objective    Interim History:   Telemetry:    Objective: Filed Vitals:   11/05/15 1530 11/05/15 1638 11/05/15 2046 11/06/15 0455  BP: 134/77 154/73 176/71 145/64  Pulse: 96 95 97 83  Temp:  98.6 F (37 C) 98.5 F (36.9 C) 98 F (36.7 C)  TempSrc:  Oral Oral Oral  Resp: 17 20 20 20   Height:  6\' 8"  (2.032 m)    Weight:  137.2 kg (302 lb 7.5 oz)    SpO2: 97% 98% 97% 97%    Intake/Output Summary (Last 24 hours) at 11/06/15 1013 Last data filed at 11/06/15 0700  Gross per 24 hour  Intake    840 ml  Output      0 ml  Net    840 ml    Exam:  General: eomi ncat  Cardiovascular: s1 s 2no m/r/g Respiratory: Still wheeze bilaterally Abdomen: Obese nontender nondistended Skin venous stasis or extremities but no swelling Neuro neurologically grossly intact F2  Data Reviewed: Basic Metabolic Panel:  Recent Labs Lab 11/05/15 1254  NA 141  K 4.3  CL 101  CO2 32  GLUCOSE 167*  BUN 10  CREATININE 0.82  CALCIUM 9.0   Liver Function Tests:  Recent Labs Lab 11/05/15 1254  AST 22  ALT 30  ALKPHOS 44    BILITOT 0.9  PROT 7.9  ALBUMIN 4.3   No results for input(s): LIPASE, AMYLASE in the last 168 hours. No results for input(s): AMMONIA in the last 168 hours. CBC:  Recent Labs Lab 11/05/15 1254  WBC 6.9  NEUTROABS 4.9  HGB 14.2  HCT 43.5  MCV 95.6  PLT 146*   Cardiac Enzymes: No results for input(s): CKTOTAL, CKMB, CKMBINDEX, TROPONINI in the last 168 hours. BNP: Invalid input(s): POCBNP CBG:  Recent Labs Lab 11/05/15 1649  GLUCAP 219*    Recent Results (from the past 240 hour(s))  Blood culture (routine x 2)     Status: None (Preliminary result)   Collection Time: 11/05/15 12:54 PM  Result Value Ref Range Status   Specimen Description BLOOD RIGHT ANTECUBITAL  Final   Special Requests   Final    BOTTLES DRAWN AEROBIC AND ANAEROBIC 5CC Performed at [facility]    Culture PENDING  Incomplete   Report Status PENDING  Incomplete  Blood culture (routine x 2)     Status: None (Preliminary result)   Collection Time: 11/05/15  1:35 PM  Result Value Ref Range Status   Specimen Description BLOOD LEFT ANTECUBITAL  Final   Special Requests   Final    BOTTLES DRAWN AEROBIC AND ANAEROBIC 5CC Performed at [facility]    Culture PENDING  Incomplete   Report Status PENDING  Incomplete     Studies:              All Imaging reviewed and is as per above notation   Scheduled Meds: . enoxaparin (LOVENOX) injection  40 mg Subcutaneous Q24H  . levofloxacin  750 mg Oral Q24H  . metFORMIN  500 mg Oral BID WC  . methylPREDNISolone (SOLU-MEDROL) injection  80 mg Intravenous Q12H   Continuous Infusions:    Assessment/Plan:  Acute hypoxic respiratory failure possibly secondary to below Keep on telemetry and oxygen for now Desats screen  ambulate  Community-acquired pneumonia Probably secondary to bronchitis. Would still check for flu. Cover with Levaquin 750 daily Chest x-ray two-view a.m.official report pending but my read shows PNA Lactic acid is not  elevated and as patient is able to eat and drink would allow diet Stable for telemetry  Acute exacerbation COPD Usually does not use nebulization has had a continuous neb Place on when necessary nebulizations every 4 when necessary DuoNeb,changed steroids 80 mg IV twice a day to PO prednisone 60 qd 12/22 Get spacer and inhlaer eduaction  Type 2 diabetes mellitus on oral medications Hold off on Amaryl for now Okay to continue metformin 500 twice a day Sliding scale started 12/22  Mitral valve prolapse-stable at this time  Chronic venous insufficiency continue TED hose Will still need to have Lovenox  History diverticulosis-currently stable  Hyperlipidemia-hold statin and aspirin for  Improved conisder d/c in am if all stable  Verneita Griffes, MD  [facility] Pager [phone] 11/06/2015, 10:13 AM    LOS: 1 day

## 2015-11-06 NOTE — Progress Notes (Signed)
Initial Nutrition Assessment  DOCUMENTATION CODES:   Obesity unspecified  INTERVENTION:  - Continue Carb Modified diet - RD will continue to monitor for needs  NUTRITION DIAGNOSIS:   Inadequate oral intake related to acute illness as evidenced by per patient/family report.  GOAL:   Patient will meet greater than or equal to 90% of their needs  MONITOR:   PO intake, Weight trends, Labs, I & O's  REASON FOR ASSESSMENT:   Consult Assessment of nutrition requirement/status  ASSESSMENT:   74 year old male, he has recently diagnosed COPD, he presents from the pulmonary clinic with severe shortness of breath, ongoing wheezing, productive cough, some hemoptysis, has been going on for 2 weeks. There is sputum production, no fevers chills or swelling of the legs. This is gradually worsening, nothing seems to make this better or worse, was sent from the clinic for admission due to severe respiratory distress  Pt seen for consult. BMI indicates obesity. Per chart review, pt has eaten 100% of meals since admission. Pt reports that appetite has greatly improved and that at baseline he has a very good appetite. He states that he cooks often and also has several family members who cook. He denies chewing/swallowing issues or any discomfort with eating now or PTA. Pt states that for the 2 weeks PTA he was not feeling well and had decreased intakes during this 2 week time frame.   Pt states that his weight was 327 lbs approximately 2 years ago but that a more recent UBW is ~314 lbs. Per chart review, pt's weight has fluctuated (302-314 lbs) over the past 5 months. Most recently, he has lost 6 lbs (2% body weight) in the past 1 month which is not significant for time frame. No muscle or fat wasting noted.  Pt likely to meet needs now that appetite has returned. Medications reviewed. Labs reviewed.   Diet Order:  Diet Carb Modified Fluid consistency:: Thin; Room service appropriate?: Yes  Skin:   Reviewed, no issues  Last BM:  12/20  Height:   Ht Readings from Last 1 Encounters:  11/05/15 6\' 8"  (2.032 m)    Weight:   Wt Readings from Last 1 Encounters:  11/05/15 302 lb 7.5 oz (137.2 kg)    Ideal Body Weight:  102.73 kg (kg)  BMI:  Body mass index is 33.23 kg/(m^2).  Estimated Nutritional Needs:   Kcal:  2200-2400  Protein:  120-130 grsm  Fluid:  >/= 2.2 L/day  EDUCATION NEEDS:   No education needs identified at this time     Jarome Matin, RD, LDN Inpatient Clinical Dietitian Pager # [phone] After hours/weekend pager # [phone]

## 2015-11-06 NOTE — Evaluation (Signed)
  Occupational Therapy Evaluation [name] MRN: [id number] DOB: 10/19/1941 Today's Date: 24-Nov-2015    History of Present Illness 74 y/o m admitted w/PNA,   Clinical Impression   Pt at baseline with ADL activity. Discussed energy conservation strategies.  Pt has no concerns about performing ADL activity upon DC    Follow Up Recommendations  No OT follow up    Equipment Recommendations  None recommended by OT       Precautions / Restrictions Precautions Precautions: None      Mobility Bed Mobility Overal bed mobility: Independent                Transfers Overall transfer level: Needs assistance   Transfers: Sit to/from Stand;Stand Pivot Transfers Sit to Stand: Independent Stand pivot transfers: Independent                 ADL Overall ADL's : At baseline                                                       Pertinent Vitals/Pain Pain Assessment: No/denies pain     Hand Dominance     Extremity/Trunk Assessment Upper Extremity Assessment Upper Extremity Assessment: Overall WFL for tasks assessed           Communication Communication Communication: No difficulties   Cognition Arousal/Alertness: Awake/alert Behavior During Therapy: WFL for tasks assessed/performed Overall Cognitive Status: Within Functional Limits for tasks assessed                                Home Living Family/patient expects to be discharged to:: Private residence Living Arrangements: Spouse/significant other;Other relatives Available Help at Discharge: Family Type of Home: House             Bathroom Shower/Tub: Teacher, early years/pre: Handicapped height                Prior Functioning/Environment Level of Independence: Independent                                       End of Session Nurse Communication: Mobility status  Activity Tolerance: Patient tolerated  treatment well Patient left: in chair;with call bell/phone within reach   Time: 1145-1202 OT Time Calculation (min): 17 min Charges:  OT General Charges $OT Visit: 1 Procedure OT Evaluation $Initial OT Evaluation Tier I: 1 Procedure G-Codes:    Betsy Pries Nov 24, 2015, 12:27 PM

## 2015-11-06 NOTE — Care Management Note (Signed)
Case Management Note  Patient Details  Name: Jason [name] MRN: [id number] Date of Birth: [birth year]  Subjective/Objective:   74 y/o m admitted w/PNA, New COPD.From home.[facility] referral-COPD.                  Action/Plan:d/c plan home.   Expected Discharge Date:   (unknown)               Expected Discharge Plan:  Home/Self Care  In-House Referral:     Discharge planning Services  CM Consult  Post Acute Care Choice:    Choice offered to:     DME Arranged:    DME Agency:     HH Arranged:    HH Agency:     Status of Service:  In process, will continue to follow  Medicare Important Message Given:    Date Medicare IM Given:    Medicare IM give by:    Date Additional Medicare IM Given:    Additional Medicare Important Message give by:     If discussed at [place] of Stay Meetings, dates discussed:    Additional Comments:  Dessa Phi, RN 11/06/2015, 10:55 AM

## 2015-11-07 ENCOUNTER — Other Ambulatory Visit: Payer: Self-pay | Admitting: *Deleted

## 2015-11-07 LAB — COMPREHENSIVE METABOLIC PANEL
ALBUMIN: 4 g/dL (ref 3.5–5.0)
ALT: 27 U/L (ref 17–63)
ANION GAP: 10 (ref 5–15)
AST: 23 U/L (ref 15–41)
Alkaline Phosphatase: 46 U/L (ref 38–126)
BUN: 19 mg/dL (ref 6–20)
CALCIUM: 9.4 mg/dL (ref 8.9–10.3)
CHLORIDE: 100 mmol/L — AB (ref 101–111)
CO2: 28 mmol/L (ref 22–32)
Creatinine, Ser: 0.97 mg/dL (ref 0.61–1.24)
GFR calc non Af Amer: >60 mL/min (ref >60)
GLUCOSE: 222 mg/dL — AB (ref 65–99)
POTASSIUM: 3.9 mmol/L (ref 3.5–5.1)
SODIUM: 138 mmol/L (ref 135–145)
Total Bilirubin: 0.8 mg/dL (ref 0.3–1.2)
Total Protein: 7.3 g/dL (ref 6.5–8.1)

## 2015-11-07 LAB — GLUCOSE, CAPILLARY: Glucose-Capillary: 174 mg/dL — ABNORMAL HIGH (ref 65–99)

## 2015-11-07 MED ORDER — LEVOFLOXACIN 750 MG PO TABS: 750.0000 mg | ORAL_TABLET | ORAL | Status: DC

## 2015-11-07 MED ORDER — PREDNISONE 20 MG PO TABS: 40.0000 mg | ORAL_TABLET | Freq: Every day | ORAL | Status: DC

## 2015-11-07 NOTE — Care Management Note (Signed)
Case Management Note  Patient Details  Name: Jason [name] MRN: [id number] Date of Birth: 1941/01/18  Subjective/Objective:                    Action/Plan:d/c home no needs or orders.   Expected Discharge Date:   (unknown)               Expected Discharge Plan:  Home/Self Care  In-House Referral:  Clinical Social Work  Discharge planning Services  CM Consult  Post Acute Care Choice:    Choice offered to:     DME Arranged:    DME Agency:     HH Arranged:    [place] Agency:     Status of Service:  Completed, signed off  Medicare Important Message Given:    Date Medicare IM Given:    Medicare IM give by:    Date Additional Medicare IM Given:    Additional Medicare Important Message give by:     If discussed at [place] of Stay Meetings, dates discussed:    Additional Comments:  Dessa Phi, RN 11/07/2015, 10:25 AM

## 2015-11-07 NOTE — Discharge Summary (Signed)
Physician Discharge Summary  Jason [name] [id number] DOB: Apr 19, 1941 DOA: 11/05/2015  PCP: Scarlette Calico, MD  Admit date: 11/05/2015 Discharge date: 11/07/2015  Time spent: 25 minutes  Recommendations for Outpatient Follow-up:  1. Prednisone burst 40 mg given to complete 12/27 2. Levaquin 750 to complete 12/25 3. Patient instructed to take an extra dose of Amaryl 1 mg if blood sugars above 300 4. Follow-up as an outpatient with primary care physician    Discharge Diagnoses:  Active Problems:   PNA (pneumonia)   Discharge Condition: Improved  Diet recommendation: Diabetic heart healthy  Filed Weights   11/05/15 1638  Weight: 137.2 kg (302 lb 7.5 oz)    History of present illness:  74 y/o ? SBO s/p Adhesiolysis 07/2011 Chr Venous insufficiency with normal ABIs 2006 Mitral valve prolapse Hyperlipidemia Medication controlled type 2 diabetes mellitus Morbid obesity Degenerative joint disease Prior smoker Diverticulosis last colonoscopy 7/16 mild diverticulitis internal hemorrhoids and polyps showing tubular adenomas Hidradenitis  Recent visit to emergency room 12/17 for bronchitis treated with Z-Pak 7 Tussionex   Found on work-up to have CAP   Hospital Course:  Acute hypoxic respiratory failure possibly secondary to below Keep on telemetry and oxygen for now Desats screen ambulateShowed that patient was at 100% on ambulation did not require oxygen  Community-acquired pneumonia Probably secondary to bronchitis. Would still check for flu. Cover with Levaquin 750 daily Chest x-ray two-view a.m.official report pending but my read shows PNA Stabilized during hospital stay   Acute exacerbation COPD Usually does not use nebulization has had a continuous neb Place on when necessary nebulizations every 4 when necessary DuoNeb,changed steroids 80 mg IV twice a day to PO prednisone 60 qd 12/22 Get spacer and inhaler eduaction in the hospital and relabel albuterol for  home use  Type 2 diabetes mellitus on oral medications Hold off on AmWhile hospitalized blood sugars in the 250 rang continue metformin 500 twice a day Sliding scale started 12/22 patient taken extra dose of Amaryl 1 mg if needed as an outpatient blood sugars over 250   Mitral valve prolapse-stable at this time  Chronic venous insufficiency continue TED hose Will still need to have Lovenox  History diverticulosis-currently stable  Hyperlipidemia-hold statin and aspirin for     Discharge Exam: Filed Vitals:   11/06/15 2218 11/07/15 0500  BP: 110/65 136/63  Pulse: 80 87  Temp: 97.4 F (36.3 C) 98.6 F (37 C)  Resp: 18 20     Alert pleasant oriented no apparent distress  CardiovasculaS1-S2 no murmur rub or gallop Respiratory-inically clear mild wheeze   Discharge Instructions   Discharge Instructions    AMB Referral to [place] Management    Complete by:  As directed   Epic referral for [facility]. Please assign to [place] for COPD disease and symptom management. Consents signed. Cell  Number [phone]; home number [phone]; Please call Marthenia Rolling, MSN-Ed, [facility] [phone] with questions. Likely discharge soon. Thanks.  Reason for consult:  Please assign to [facility] RNCM  Diagnoses of:  COPD/ Pneumonia  Expected date of contact:  1-3 days (reserved for hospital discharges)          Current Discharge Medication List    CONTINUE these medications which have NOT CHANGED   Details  albuterol (PROVENTIL HFA;VENTOLIN HFA) 108 (90 BASE) MCG/ACT inhaler Inhale 1-2 puffs into the lungs every 6 (six) hours as needed for wheezing or shortness of breath. Qty: 3 Inhaler, Refills: 2    aspirin  EC 81 MG tablet Take 1 tablet (81 mg total) by mouth daily. Qty: 90 tablet, Refills: 3    chlorpheniramine-HYDROcodone (TUSSIONEX PENNKINETIC ER) 10-8 MG/5ML SUER Take 5 mLs by mouth every 12 (twelve) hours as needed for cough. Qty: 115 mL, Refills:  0    glimepiride (AMARYL) 2 MG tablet Take 1 tablet (2 mg total) by mouth daily with breakfast. Qty: 90 tablet, Refills: 1    metFORMIN (GLUCOPHAGE) 500 MG tablet TAKE 1 TABLET (500 MG TOTAL) BY MOUTH 2 (TWO) TIMES DAILY WITH A MEAL. Qty: 180 tablet, Refills: 1    simvastatin (ZOCOR) 40 MG tablet Take 1 tablet by mouth  every evening Qty: 90 tablet, Refills: 3   Associated Diagnoses: Diabetes mellitus type 2 with atherosclerosis of arteries of extremities (HCC); PAD (peripheral artery disease) ([place]); Hyperlipidemia with target LDL less than 70    vitamin C (ASCORBIC ACID) 500 MG tablet Take 1 tablet (500 mg total) by mouth daily. Qty: 90 tablet, Refills: 3       No Known Allergies    The results of significant diagnostics from this hospitalization (including imaging, microbiology, ancillary and laboratory) are listed below for reference.    Significant Diagnostic Studies: Dg Chest 2 View  11/06/2015  CLINICAL DATA:  Pneumonia EXAM: CHEST  2 VIEW COMPARISON:  10/26/2015 FINDINGS: Chronic scarring at the left lung base. Superimposed left lower lobe pneumonia is not excluded. No pleural effusion or pneumothorax. Mild cardiomegaly. Visualized osseous structures are within normal limits. IMPRESSION: Chronic left lower lobe scarring. Superimposed left lower lobe pneumonia is not excluded. Electronically Signed   By: Julian Hy M.D.   On: 11/06/2015 11:32   Dg Chest 2 View  11/01/2015  CLINICAL DATA:  Cough for 3 weeks. Coughing up yellow sputum. No chest pain. EXAM: CHEST  2 VIEW COMPARISON:  Chest radiograph 09/24/2015; 05/28/2014. FINDINGS: Stable cardiac and mediastinal contours. No large area of pulmonary consolidation. No pleural effusion or pneumothorax. Regional skeleton is unremarkable. IMPRESSION: No active cardiopulmonary disease. Electronically Signed   By: Lovey Newcomer M.D.   On: 11/01/2015 07:47   Dg Chest Port 1 View  11/05/2015  CLINICAL DATA:  Two weeks productive  cough, wheezing, shortness of breath, chest tightness. EXAM: PORTABLE CHEST 1 VIEW COMPARISON:  11/01/2015 FINDINGS: Heart is normal size. Increased density at the left lung base could reflect area of pneumonia. Right lung is clear. No effusions. No acute bony abnormality. IMPRESSION: Left basilar airspace opacity could reflect area of pneumonia. Electronically Signed   By: Rolm Baptise M.D.   On: 11/05/2015 13:11    Microbiology: Recent Results (from the past 240 hour(s))  Blood culture (routine x 2)     Status: None (Preliminary result)   Collection Time: 11/05/15 12:54 PM  Result Value Ref Range Status   Specimen Description BLOOD RIGHT ANTECUBITAL  Final   Special Requests BOTTLES DRAWN AEROBIC AND ANAEROBIC 5CC  Final   Culture   Final    NO GROWTH 1 DAY Performed at [facility]    Report Status PENDING  Incomplete  Blood culture (routine x 2)     Status: None (Preliminary result)   Collection Time: 11/05/15  1:35 PM  Result Value Ref Range Status   Specimen Description BLOOD LEFT ANTECUBITAL  Final   Special Requests BOTTLES DRAWN AEROBIC AND ANAEROBIC 5CC  Final   Culture   Final    NO GROWTH 1 DAY Performed at [facility]    Report Status PENDING  Incomplete     Labs: Basic Metabolic Panel:  Recent Labs Lab 11/05/15 1254 11/07/15 0441  NA 141 138  K 4.3 3.9  CL 101 100*  CO2 32 28  GLUCOSE 167* 222*  BUN 10 19  CREATININE 0.82 0.97  CALCIUM 9.0 9.4   Liver Function Tests:  Recent Labs Lab 11/05/15 1254 11/07/15 0441  AST 22 23  ALT 30 27  ALKPHOS 44 46  BILITOT 0.9 0.8  PROT 7.9 7.3  ALBUMIN 4.3 4.0   No results for input(s): LIPASE, AMYLASE in the last 168 hours. No results for input(s): AMMONIA in the last 168 hours. CBC:  Recent Labs Lab 11/05/15 1254  WBC 6.9  NEUTROABS 4.9  HGB 14.2  HCT 43.5  MCV 95.6  PLT 146*   Cardiac Enzymes: No results for input(s): CKTOTAL, CKMB, CKMBINDEX, TROPONINI in the last 168  hours. BNP: BNP (last 3 results) No results for input(s): BNP in the last 8760 hours.  ProBNP (last 3 results) No results for input(s): PROBNP in the last 8760 hours.  CBG:  Recent Labs Lab 11/05/15 1649 11/06/15 1422 11/06/15 1647 11/06/15 2212 11/07/15 0747  GLUCAP 219* 281* 284* 254* 174*       Signed:  Nita Sells MD  FACP  [facility] 11/07/2015, 9:32 AM

## 2015-11-07 NOTE — Consult Note (Signed)
   [facility] CM Inpatient Consult   11/07/2015  Jason [name] Mar 07, 1941 [id number]   EPIC referral received for [facility] Care Management services. Spoke with Jason [name] at bedside to discuss, explain, and offer [place] Management. Written consent obtained. Explained that he will receive post hospital transition of care calls and will be evaluated for monthly home visits. He is agreeable to this. Endorses he could use the additional support and reinforcement regarding COPD management. He also endorses he has DM. Confirmed best contact information and [facility] Care Management packet left with him at bedside along with contact information. Appreciative of visit. Will request to be assigned to [facility].    Marthenia Rolling, MSN-Ed, RN,BSN [facility] Liaison [phone]

## 2015-11-10 LAB — CULTURE, BLOOD (ROUTINE X 2)
CULTURE: NO GROWTH
Culture: NO GROWTH

## 2015-11-11 ENCOUNTER — Other Ambulatory Visit: Payer: Self-pay | Admitting: *Deleted

## 2015-11-11 NOTE — Patient Outreach (Signed)
[facility]) Care Management  11/11/2015  JAVAD KIO [birth year] [id number]   Transition of care (week 1)  RN introduce [facility] services and purpose of today's call. Spouse indicated pt's mother is ill and currently with Hospice. States the family including the pt are with her now. States the pt is doing well however an occasional wheeze but resolved quickly with rest. States pt currently having issues with his diabetes but his breathing is "fine". RN offered to call back and wife indicated do to the ill state his mother was in suggested RN call back on Thursday. RN will follow up again this week on the requested day Thursday and further inquire on pt's discharge orders and COPD status. Will offer community home visits at this time for further involvement with [facility] if applicable. Transition of care template incomplete due to pt unavailability. Will update accordingly.  Raina Mina, RN Care Management Coordinator [place] [facility] Main Office [phone]

## 2015-11-13 ENCOUNTER — Other Ambulatory Visit: Payer: Self-pay | Admitting: *Deleted

## 2015-11-13 NOTE — Patient Outreach (Signed)
[facility]) Care Management  11/13/2015  Jason [name] December 25, 1940 [id number]  Transition of care (Week 1)  RN attempted once again to reach pt however unsuccessful. Will continue outreach attempts for [facility] services.   Raina Mina, RN Care Management Coordinator [place] [facility] Main Office [phone]

## 2015-11-14 ENCOUNTER — Other Ambulatory Visit: Payer: Self-pay | Admitting: *Deleted

## 2015-11-14 NOTE — Patient Outreach (Signed)
[facility]) Care Management  11/14/2015  Jason [name] 01-24-1941 [id number]  Transition of care (week 1)  RN spoke with pt today and introduced the [facility] program and services. Pt reports his awareness of his recent hospitalization and Rn was able to complete the transition of care template. Pt states he has all his discharge medications however another medication was recently called into his pharmacy and he has yet to obtain this medication. Pt also not aware if he has a follow up appointment with his provider but will inquire with this wife and confirm with provider. RN briefly discussed COPD action plan as pt knowledge was limited and further extended a hom visit to further educate pt in managing his care. Pt receptive and a schedule home visit was arranged. Will follow up accordingly and initiate pt into a program for service.   Raina Mina, RN Care Management Coordinator [place] [facility] Main Office [phone]

## 2015-11-16 ENCOUNTER — Encounter (HOSPITAL_COMMUNITY): Payer: Self-pay

## 2015-11-16 ENCOUNTER — Inpatient Hospital Stay (HOSPITAL_COMMUNITY)
Admission: EM | Admit: 2015-11-16 | Discharge: 2015-11-19 | DRG: 190 | Disposition: A | Discharge status: Home / Self Care | Payer: Commercial Managed Care - HMO | Attending: Internal Medicine | Admitting: Internal Medicine

## 2015-11-16 ENCOUNTER — Emergency Department (HOSPITAL_COMMUNITY): Payer: Commercial Managed Care - HMO

## 2015-11-16 DIAGNOSIS — I70209 Unspecified atherosclerosis of native arteries of extremities, unspecified extremity: Secondary | ICD-10-CM | POA: Diagnosis not present

## 2015-11-16 DIAGNOSIS — J45909 Unspecified asthma, uncomplicated: Secondary | ICD-10-CM | POA: Diagnosis present

## 2015-11-16 DIAGNOSIS — Z79899 Other long term (current) drug therapy: Secondary | ICD-10-CM | POA: Diagnosis not present

## 2015-11-16 DIAGNOSIS — M1990 Unspecified osteoarthritis, unspecified site: Secondary | ICD-10-CM | POA: Diagnosis present

## 2015-11-16 DIAGNOSIS — R079 Chest pain, unspecified: Secondary | ICD-10-CM

## 2015-11-16 DIAGNOSIS — E119 Type 2 diabetes mellitus without complications: Secondary | ICD-10-CM | POA: Diagnosis not present

## 2015-11-16 DIAGNOSIS — K589 Irritable bowel syndrome without diarrhea: Secondary | ICD-10-CM | POA: Diagnosis present

## 2015-11-16 DIAGNOSIS — Z7982 Long term (current) use of aspirin: Secondary | ICD-10-CM | POA: Diagnosis not present

## 2015-11-16 DIAGNOSIS — Z7984 Long term (current) use of oral hypoglycemic drugs: Secondary | ICD-10-CM

## 2015-11-16 DIAGNOSIS — T380X5A Adverse effect of glucocorticoids and synthetic analogues, initial encounter: Secondary | ICD-10-CM | POA: Diagnosis not present

## 2015-11-16 DIAGNOSIS — E1151 Type 2 diabetes mellitus with diabetic peripheral angiopathy without gangrene: Secondary | ICD-10-CM | POA: Diagnosis present

## 2015-11-16 DIAGNOSIS — E785 Hyperlipidemia, unspecified: Secondary | ICD-10-CM | POA: Diagnosis not present

## 2015-11-16 DIAGNOSIS — J9811 Atelectasis: Secondary | ICD-10-CM | POA: Diagnosis not present

## 2015-11-16 DIAGNOSIS — R05 Cough: Secondary | ICD-10-CM | POA: Diagnosis not present

## 2015-11-16 DIAGNOSIS — F329 Major depressive disorder, single episode, unspecified: Secondary | ICD-10-CM | POA: Diagnosis not present

## 2015-11-16 DIAGNOSIS — K219 Gastro-esophageal reflux disease without esophagitis: Secondary | ICD-10-CM | POA: Diagnosis present

## 2015-11-16 DIAGNOSIS — R0602 Shortness of breath: Secondary | ICD-10-CM | POA: Diagnosis not present

## 2015-11-16 DIAGNOSIS — G4733 Obstructive sleep apnea (adult) (pediatric): Secondary | ICD-10-CM | POA: Diagnosis present

## 2015-11-16 DIAGNOSIS — E1169 Type 2 diabetes mellitus with other specified complication: Secondary | ICD-10-CM | POA: Diagnosis not present

## 2015-11-16 DIAGNOSIS — Z8042 Family history of malignant neoplasm of prostate: Secondary | ICD-10-CM | POA: Diagnosis not present

## 2015-11-16 DIAGNOSIS — Z720 Tobacco use: Secondary | ICD-10-CM

## 2015-11-16 DIAGNOSIS — Z823 Family history of stroke: Secondary | ICD-10-CM | POA: Diagnosis not present

## 2015-11-16 DIAGNOSIS — I872 Venous insufficiency (chronic) (peripheral): Secondary | ICD-10-CM | POA: Diagnosis present

## 2015-11-16 DIAGNOSIS — F17210 Nicotine dependence, cigarettes, uncomplicated: Secondary | ICD-10-CM | POA: Diagnosis not present

## 2015-11-16 DIAGNOSIS — E1159 Type 2 diabetes mellitus with other circulatory complications: Secondary | ICD-10-CM | POA: Diagnosis not present

## 2015-11-16 DIAGNOSIS — Z833 Family history of diabetes mellitus: Secondary | ICD-10-CM | POA: Diagnosis not present

## 2015-11-16 DIAGNOSIS — J9601 Acute respiratory failure with hypoxia: Secondary | ICD-10-CM | POA: Diagnosis not present

## 2015-11-16 DIAGNOSIS — J441 Chronic obstructive pulmonary disease with (acute) exacerbation: Secondary | ICD-10-CM | POA: Diagnosis not present

## 2015-11-16 DIAGNOSIS — I341 Nonrheumatic mitral (valve) prolapse: Secondary | ICD-10-CM | POA: Diagnosis present

## 2015-11-16 DIAGNOSIS — D72829 Elevated white blood cell count, unspecified: Secondary | ICD-10-CM | POA: Diagnosis not present

## 2015-11-16 DIAGNOSIS — Z8701 Personal history of pneumonia (recurrent): Secondary | ICD-10-CM

## 2015-11-16 LAB — GLUCOSE, CAPILLARY
GLUCOSE-CAPILLARY: 286 mg/dL — AB (ref 65–99)
Glucose-Capillary: 314 mg/dL — ABNORMAL HIGH (ref 65–99)

## 2015-11-16 LAB — CBC
HCT: 42.9 % (ref 39.0–52.0)
Hemoglobin: 14 g/dL (ref 13.0–17.0)
MCH: 31.1 pg (ref 26.0–34.0)
MCHC: 32.6 g/dL (ref 30.0–36.0)
MCV: 95.3 fL (ref 78.0–100.0)
PLATELETS: 161 10*3/uL (ref 150–400)
RBC: 4.5 MIL/uL (ref 4.22–5.81)
RDW: 12 % (ref 11.5–15.5)
WBC: 7.4 10*3/uL (ref 4.0–10.5)

## 2015-11-16 LAB — COMPREHENSIVE METABOLIC PANEL
ALBUMIN: 4.3 g/dL (ref 3.5–5.0)
ALT: 33 U/L (ref 17–63)
ANION GAP: 11 (ref 5–15)
AST: 18 U/L (ref 15–41)
Alkaline Phosphatase: 48 U/L (ref 38–126)
BUN: 8 mg/dL (ref 6–20)
CO2: 27 mmol/L (ref 22–32)
Calcium: 8.7 mg/dL — ABNORMAL LOW (ref 8.9–10.3)
Chloride: 103 mmol/L (ref 101–111)
Creatinine, Ser: 0.77 mg/dL (ref 0.61–1.24)
GFR calc non Af Amer: >60 mL/min (ref >60)
GLUCOSE: 254 mg/dL — AB (ref 65–99)
POTASSIUM: 3.9 mmol/L (ref 3.5–5.1)
SODIUM: 141 mmol/L (ref 135–145)
Total Bilirubin: 0.7 mg/dL (ref 0.3–1.2)
Total Protein: 7.2 g/dL (ref 6.5–8.1)

## 2015-11-16 LAB — I-STAT TROPONIN, ED: Troponin i, poc: 0 ng/mL (ref 0.00–0.08)

## 2015-11-16 LAB — BRAIN NATRIURETIC PEPTIDE: B NATRIURETIC PEPTIDE 5: 46.1 pg/mL (ref 0.0–100.0)

## 2015-11-16 MED ORDER — INSULIN ASPART 100 UNIT/ML ~~LOC~~ SOLN
0.0000 [IU] | Freq: Three times a day (TID) | SUBCUTANEOUS | Status: DC
  Administered 2015-11-16 – 2015-11-17 (×4): 11 [IU] via SUBCUTANEOUS
  Administered 2015-11-17: 8 [IU] via SUBCUTANEOUS

## 2015-11-16 MED ORDER — METHYLPREDNISOLONE SODIUM SUCC 125 MG IJ SOLR
125.0000 mg | Freq: Once | INTRAMUSCULAR | Status: AC
  Administered 2015-11-16: 125 mg via INTRAVENOUS
  Filled 2015-11-16: qty 2

## 2015-11-16 MED ORDER — SODIUM CHLORIDE 0.9 % IV SOLN
INTRAVENOUS | Status: AC
  Administered 2015-11-16 – 2015-11-17 (×2): via INTRAVENOUS

## 2015-11-16 MED ORDER — ENOXAPARIN SODIUM 80 MG/0.8ML ~~LOC~~ SOLN
70.0000 mg | SUBCUTANEOUS | Status: DC
  Administered 2015-11-16 – 2015-11-18 (×3): 70 mg via SUBCUTANEOUS
  Filled 2015-11-16 (×4): qty 0.8

## 2015-11-16 MED ORDER — ASPIRIN EC 81 MG PO TBEC
81.0000 mg | DELAYED_RELEASE_TABLET | Freq: Every day | ORAL | Status: DC
  Administered 2015-11-16 – 2015-11-19 (×4): 81 mg via ORAL
  Filled 2015-11-16 (×4): qty 1

## 2015-11-16 MED ORDER — ACETAMINOPHEN 325 MG PO TABS: 650.0000 mg | ORAL_TABLET | Freq: Four times a day (QID) | ORAL | Status: DC | PRN

## 2015-11-16 MED ORDER — IPRATROPIUM-ALBUTEROL 0.5-2.5 (3) MG/3ML IN SOLN
3.0000 mL | Freq: Four times a day (QID) | RESPIRATORY_TRACT | Status: DC
Start: 2015-11-16 — End: 2015-11-17
  Administered 2015-11-16 (×2): 3 mL via RESPIRATORY_TRACT
  Filled 2015-11-16 (×2): qty 3

## 2015-11-16 MED ORDER — INSULIN ASPART 100 UNIT/ML ~~LOC~~ SOLN
0.0000 [IU] | Freq: Every day | SUBCUTANEOUS | Status: DC
  Administered 2015-11-16: 3 [IU] via SUBCUTANEOUS
  Administered 2015-11-17: 4 [IU] via SUBCUTANEOUS

## 2015-11-16 MED ORDER — METHYLPREDNISOLONE SODIUM SUCC 125 MG IJ SOLR
60.0000 mg | Freq: Four times a day (QID) | INTRAMUSCULAR | Status: DC
  Administered 2015-11-16 – 2015-11-17 (×4): 60 mg via INTRAVENOUS
  Filled 2015-11-16 (×8): qty 0.96

## 2015-11-16 MED ORDER — ALBUTEROL SULFATE (2.5 MG/3ML) 0.083% IN NEBU
5.0000 mg | INHALATION_SOLUTION | Freq: Once | RESPIRATORY_TRACT | Status: AC
  Administered 2015-11-16: 5 mg via RESPIRATORY_TRACT
  Filled 2015-11-16: qty 6

## 2015-11-16 MED ORDER — BUDESONIDE 0.25 MG/2ML IN SUSP
0.2500 mg | Freq: Two times a day (BID) | RESPIRATORY_TRACT | Status: DC
  Administered 2015-11-17 – 2015-11-18 (×3): 0.25 mg via RESPIRATORY_TRACT
  Filled 2015-11-16 (×5): qty 2

## 2015-11-16 MED ORDER — ONDANSETRON HCL 4 MG/2ML IJ SOLN: 4.0000 mg | Freq: Four times a day (QID) | INTRAMUSCULAR | Status: DC | PRN

## 2015-11-16 MED ORDER — ALBUTEROL SULFATE (2.5 MG/3ML) 0.083% IN NEBU: 2.5000 mg | INHALATION_SOLUTION | RESPIRATORY_TRACT | Status: DC | PRN

## 2015-11-16 MED ORDER — ONDANSETRON HCL 4 MG PO TABS
4.0000 mg | ORAL_TABLET | Freq: Four times a day (QID) | ORAL | Status: DC | PRN
Start: 2015-11-16 — End: 2015-11-19
  Administered 2015-11-18: 4 mg via ORAL
  Filled 2015-11-16: qty 1

## 2015-11-16 MED ORDER — ALBUTEROL (5 MG/ML) CONTINUOUS INHALATION SOLN
10.0000 mg/h | INHALATION_SOLUTION | Freq: Once | RESPIRATORY_TRACT | Status: AC
  Administered 2015-11-16: 10 mg/h via RESPIRATORY_TRACT
  Filled 2015-11-16: qty 20

## 2015-11-16 MED ORDER — SIMVASTATIN 40 MG PO TABS
40.0000 mg | ORAL_TABLET | Freq: Every day | ORAL | Status: DC
  Administered 2015-11-16 – 2015-11-18 (×3): 40 mg via ORAL
  Filled 2015-11-16 (×5): qty 1

## 2015-11-16 MED ORDER — ALBUTEROL SULFATE (2.5 MG/3ML) 0.083% IN NEBU: 2.5000 mg | INHALATION_SOLUTION | Freq: Four times a day (QID) | RESPIRATORY_TRACT | Status: DC

## 2015-11-16 MED ORDER — IPRATROPIUM BROMIDE 0.02 % IN SOLN: 0.5000 mg | Freq: Four times a day (QID) | RESPIRATORY_TRACT | Status: DC

## 2015-11-16 MED ORDER — ACETAMINOPHEN 650 MG RE SUPP: 650.0000 mg | Freq: Four times a day (QID) | RECTAL | Status: DC | PRN

## 2015-11-16 NOTE — ED Notes (Signed)
MD at bedside. 

## 2015-11-16 NOTE — ED Notes (Signed)
Pt O2 sat in mid/low 80s with ambulation on room air.

## 2015-11-16 NOTE — ED Provider Notes (Signed)
CSN: [id number]     Arrival date & time 11/16/15  [id number] History   First MD Initiated Contact with Patient 11/16/15 [phone]     Chief Complaint  Patient presents with  . Shortness of Breath    HPI Pt presents to the ED with shortness of breath.  He has been diagnosed with COPD.  He was admitted to the hospital on the 21st for PNA.  He was discharted but in the last couple of days he started coughing and feeling short of breath again.  The sx are worse with acitivity.  No fever or chest pain.  No leg swelling.  Slight headache. He does not use oxygen at home. He has been using his breathing treatments with only slight improvement. Past Medical History  Diagnosis Date  . Unspecified sinusitis (chronic)   . Mitral valve disorders   . Unspecified venous (peripheral) insufficiency   . Type II or unspecified type diabetes mellitus without mention of complication, not stated as uncontrolled   . Obesity, unspecified   . Esophageal reflux   . Diverticulosis of colon (without mention of hemorrhage)   . Irritable bowel syndrome   . Benign neoplasm of colon   . History of BPH   . History of gynecomastia     Unilateral  . DJD (degenerative joint disease)   . Hidradenitis   . MVP (mitral valve prolapse)   . Pneumonia, organism unspecified 2009  . Depression   . Sleep apnea     does not use  cpap or bipap .. no study  ever  . Heart murmur 20 yrs at least.     mitral valve prolapse.   . Allergy   . Asthma     in past, no inhaler now  . Glaucoma   . Hyperlipidemia    Past Surgical History  Procedure Laterality Date  . Knee arthroscopy    . Toe amputation  06/2009    x3Right foot second toe -Dr Beola Cord  . Laser ablation  06/2009    Left Greater Saphenous vein -Dr Donnetta Hutching  . Circumcision  06/2100    For Phimosis - Dr Hartley Barefoot  . Laparotomy  07/20/11  . Colon surgery    . Tonsillectomy    . Amputation  08/09/2012    Procedure: AMPUTATION DIGIT;  Surgeon: Newt Minion, MD;  Location: [facility];   Service: Orthopedics;  Laterality: Right;  Amputation right Great Toe MTP Joint  . Amputation  10/17/2012    Procedure: AMPUTATION DIGIT;  Surgeon: Newt Minion, MD;  Location: [facility];  Service: Orthopedics;  Laterality: Right;  Right Foot 3rd Toe Amputation at Metatarsophalangeal Joint   Family History  Problem Relation Age of Onset  . Diabetes Father   . Cancer Father     Prostate  . Stroke Father   . Prostate cancer Father   . Colon cancer Neg Hx   . Esophageal cancer Neg Hx   . Rectal cancer Neg Hx   . Stomach cancer Neg Hx    Social History  Substance Use Topics  . Smoking status: Former Smoker -- 0.50 packs/day for 40 years    Types: Cigarettes    Quit date: 09/05/2015  . Smokeless tobacco: Never Used  . Alcohol Use: 4.2 oz/week    7 Cans of beer per week     Comment: not every week     Review of Systems  All other systems reviewed and are negative.     Allergies  Review of  patient's allergies indicates no known allergies.  Home Medications   Prior to Admission medications   Medication Sig Start Date End Date Taking? Authorizing Provider  albuterol (PROVENTIL HFA;VENTOLIN HFA) 108 (90 BASE) MCG/ACT inhaler Inhale 1-2 puffs into the lungs every 6 (six) hours as needed for wheezing or shortness of breath. 10/08/15  Yes Noralee Space, MD  aspirin EC 81 MG tablet Take 1 tablet (81 mg total) by mouth daily. 11/14/14  Yes [name], MD  glimepiride (AMARYL) 2 MG tablet Take 1 tablet (2 mg total) by mouth daily with breakfast. 11/15/14  Yes [name], MD  metFORMIN (GLUCOPHAGE) 500 MG tablet TAKE 1 TABLET (500 MG TOTAL) BY MOUTH 2 (TWO) TIMES DAILY WITH A MEAL. 10/01/15  Yes [name], MD  simvastatin (ZOCOR) 40 MG tablet Take 1 tablet by mouth  every evening 11/18/14  Yes [name], MD  vitamin C (ASCORBIC ACID) 500 MG tablet Take 1 tablet (500 mg total) by mouth daily. 11/14/14  Yes [name], MD  levofloxacin (LEVAQUIN) 750 MG tablet Take 1 tablet  (750 mg total) by mouth daily. Patient not taking: Reported on 11/16/2015 11/07/15   Nita Sells, MD   BP 129/56 mmHg  Pulse 86  Temp(Src) 98.4 F (36.9 C) (Oral)  Resp 25  SpO2 91% Physical Exam  Constitutional: He appears well-developed and well-nourished. No distress.  HENT:  Head: Normocephalic and atraumatic.  Right Ear: External ear normal.  Left Ear: External ear normal.  Eyes: Conjunctivae are normal. Right eye exhibits no discharge. Left eye exhibits no discharge. No scleral icterus.  Neck: Neck supple. No tracheal deviation present.  Cardiovascular: Normal rate, regular rhythm and intact distal pulses.   Pulmonary/Chest: Effort normal. No stridor. Tachypnea noted. No respiratory distress. He has wheezes. He has no rales.  Abdominal: Soft. Bowel sounds are normal. He exhibits no distension. There is no tenderness. There is no rebound and no guarding.  Musculoskeletal: He exhibits edema (trace below the knee). He exhibits no tenderness.  Neurological: He is alert. He has normal strength. No cranial nerve deficit (no facial droop, extraocular movements intact, no slurred speech) or sensory deficit. He exhibits normal muscle tone. He displays no seizure activity. Coordination normal.  Skin: Skin is warm and dry. No rash noted.  Psychiatric: He has a normal mood and affect.  Nursing note and vitals reviewed.   ED Course  Procedures (including critical care time) Labs Review Labs Reviewed  COMPREHENSIVE METABOLIC PANEL - Abnormal; Notable for the following:    Glucose, Bld 254 (*)    Calcium 8.7 (*)    All other components within normal limits  CBC  BRAIN NATRIURETIC PEPTIDE  I-STAT TROPOININ, ED    Imaging Review Dg Chest 2 View  11/16/2015  CLINICAL DATA:  Shortness of breath, cough. EXAM: CHEST  2 VIEW COMPARISON:  November 06, 2015. FINDINGS: Stable left basilar scarring is noted. No acute pulmonary disease is noted. Bony thorax is unremarkable. No pneumothorax  or pleural effusion is noted. IMPRESSION: Stable left basilar scarring. No acute cardiopulmonary abnormality seen. Electronically Signed   By: Marijo Conception, M.D.   On: 11/16/2015 08:21   I have personally reviewed and evaluated these images and lab results as part of my medical decision-making.   EKG Interpretation   Date/Time:  Sunday November 16 2015 07:04:56 EST Ventricular Rate:  85 PR Interval:  263 QRS Duration: 92 QT Interval:  373 QTC Calculation: 443 R Axis:  65 Text Interpretation:  Sinus rhythm Prolonged PR interval Borderline T wave  abnormalities No significant change since last tracing Confirmed by Zymeir Salminen   MD-J, Lashaye Fisk ([id number]) on 11/16/2015 7:23:07 AM     Medications  albuterol (PROVENTIL) (2.5 MG/3ML) 0.083% nebulizer solution 5 mg (5 mg Nebulization Given 11/16/15 0655)  methylPREDNISolone sodium succinate (SOLU-MEDROL) 125 mg/2 mL injection 125 mg (125 mg Intravenous Given 11/16/15 0735)  albuterol (PROVENTIL,VENTOLIN) solution continuous neb (10 mg/hr Nebulization Given 11/16/15 0745)    MDM   Final diagnoses:  COPD exacerbation ([place])    0942  CXR without PNA.  No evidence of heart failure. patient's symptoms have improved with treatment. He is still tachypnea, however and wheezing on exam. Patient feels like he is closer to his baseline. I will have him walk around the emergency room to see how he is feeling and to help decide whether he needs to be admitted for his copd exacerbation vs outpatient treatment.  1015 patient walked around the emergency department. His oxygen saturation dropped into the mid to low 80s. He became short of breath. Patient is now comfortable again at the bedside. He is on nasal cannula oxygen and his oxygen saturation is in the 90s.  Because of his exertional hypoxia, I will consult the medical service for admission and further treatment of his COPD exacerbation.    Dorie Rank, MD 11/16/15 1036

## 2015-11-16 NOTE — H&P (Signed)
[facility] History and Physical  Jason [name] [id number] DOB: November 30, 1940 DOA: 11/16/2015   PCP: Scarlette Calico, MD   Chief Complaint: Shortness of breath for 3 days  HPI: Jason [name] is a 75 y.o. male with past medical history of diabetes, COPD, tobacco abuse, mitral valve prolapse, OSA, who was hospitalized recently for community-acquired pneumonia. He was discharged on December 23. He completed the course of his antibiotics. Few days ago, patient started developing worsening shortness of breath. He started wheezing. He denies having smoked cigarettes in the last few days. Symptoms were not getting better, so he decided to come to the hospital. He has been wheezing. Denies any chest pain but has been having some chest tightness as a result of his wheezing and shortness of breath. Had nausea but denies any vomiting. No fever, no chills. After receiving nebulizer treatments in the emergency department. He is feeling slightly better. When he was ambulating his oxygen saturations dropped into the 80s. Subsequently, he was hospitalized for further management.  Home Medications: Prior to Admission medications   Medication Sig Start Date End Date Taking? Authorizing Provider  albuterol (PROVENTIL HFA;VENTOLIN HFA) 108 (90 BASE) MCG/ACT inhaler Inhale 1-2 puffs into the lungs every 6 (six) hours as needed for wheezing or shortness of breath. 10/08/15  Yes Noralee Space, MD  aspirin EC 81 MG tablet Take 1 tablet (81 mg total) by mouth daily. 11/14/14  Yes [name], MD  glimepiride (AMARYL) 2 MG tablet Take 1 tablet (2 mg total) by mouth daily with breakfast. 11/15/14  Yes [name], MD  metFORMIN (GLUCOPHAGE) 500 MG tablet TAKE 1 TABLET (500 MG TOTAL) BY MOUTH 2 (TWO) TIMES DAILY WITH A MEAL. 10/01/15  Yes [name], MD  simvastatin (ZOCOR) 40 MG tablet Take 1 tablet by mouth  every evening 11/18/14  Yes [name], MD  vitamin C (ASCORBIC ACID) 500 MG tablet Take 1 tablet  (500 mg total) by mouth daily. 11/14/14  Yes [name], MD  levofloxacin (LEVAQUIN) 750 MG tablet Take 1 tablet (750 mg total) by mouth daily. Patient not taking: Reported on 11/16/2015 11/07/15   Nita Sells, MD    Allergies: No Known Allergies  Past Medical History: Past Medical History  Diagnosis Date  . Unspecified sinusitis (chronic)   . Mitral valve disorders   . Unspecified venous (peripheral) insufficiency   . Type II or unspecified type diabetes mellitus without mention of complication, not stated as uncontrolled   . Obesity, unspecified   . Esophageal reflux   . Diverticulosis of colon (without mention of hemorrhage)   . Irritable bowel syndrome   . Benign neoplasm of colon   . History of BPH   . History of gynecomastia     Unilateral  . DJD (degenerative joint disease)   . Hidradenitis   . MVP (mitral valve prolapse)   . Pneumonia, organism unspecified 2009  . Depression   . Sleep apnea     does not use  cpap or bipap .. no study  ever  . Heart murmur 20 yrs at least.     mitral valve prolapse.   . Allergy   . Asthma     in past, no inhaler now  . Glaucoma   . Hyperlipidemia     Past Surgical History  Procedure Laterality Date  . Knee arthroscopy    . Toe amputation  06/2009    x3Right foot second toe -Dr Beola Cord  . Laser  ablation  06/2009    Left Greater Saphenous vein -Dr Donnetta Hutching  . Circumcision  06/2100    For Phimosis - Dr Hartley Barefoot  . Laparotomy  07/20/11  . Colon surgery    . Tonsillectomy    . Amputation  08/09/2012    Procedure: AMPUTATION DIGIT;  Surgeon: Newt Minion, MD;  Location: [facility];  Service: Orthopedics;  Laterality: Right;  Amputation right Great Toe MTP Joint  . Amputation  10/17/2012    Procedure: AMPUTATION DIGIT;  Surgeon: Newt Minion, MD;  Location: [facility];  Service: Orthopedics;  Laterality: Right;  Right Foot 3rd Toe Amputation at Metatarsophalangeal Joint    Social History: Patient lives with his family in [place].  He said he hasn't smoked in the last 4 weeks. Denies any alcohol use recently. No illicit drug use. Usually independent with daily activities.  Family History:  Family History  Problem Relation Age of Onset  . Diabetes Father   . Cancer Father     Prostate  . Stroke Father   . Prostate cancer Father   . Colon cancer Neg Hx   . Esophageal cancer Neg Hx   . Rectal cancer Neg Hx   . Stomach cancer Neg Hx      Review of Systems - History obtained from the patient General ROS: positive for  - fatigue Psychological ROS: negative Ophthalmic ROS: negative ENT ROS: negative Allergy and Immunology ROS: negative Hematological and Lymphatic ROS: negative Endocrine ROS: negative Respiratory ROS: as in hpi Cardiovascular ROS: as in hpi Gastrointestinal ROS: no abdominal pain, change in bowel habits, or black or bloody stools Genito-Urinary ROS: no dysuria, trouble voiding, or hematuria Musculoskeletal ROS: negative Neurological ROS: no TIA or stroke symptoms Dermatological ROS: negative  Physical Examination  Filed Vitals:   11/16/15 1030 11/16/15 1130 11/16/15 1223 11/16/15 1329  BP: 144/77 138/70 137/67 147/52  Pulse: 94 87 89 86  Temp:   98.4 F (36.9 C) 97.9 F (36.6 C)  TempSrc:   Oral Oral  Resp: '16 26 21 21  ' Height:   '6\' 8"'  (2.032 m)   Weight:   142.429 kg (314 lb)   SpO2: 94% 96% 95% 97%    BP 147/52 mmHg  Pulse 86  Temp(Src) 97.9 F (36.6 C) (Oral)  Resp 21  Ht '6\' 8"'  (2.032 m)  Wt 142.429 kg (314 lb)  BMI 34.49 kg/m2  SpO2 97%  General appearance: alert, cooperative, appears stated age and no distress Head: Normocephalic, without obvious abnormality, atraumatic Eyes: conjunctivae/corneas clear. PERRL, EOM's intact.  Throat: lips, mucosa, and tongue normal; teeth and gums normal Neck: no adenopathy, no carotid bruit, no JVD, supple, symmetrical, trachea midline and thyroid not enlarged, symmetric, no tenderness/mass/nodules Resp: Diffuse wheezing heard  bilaterally. No definite rhonchi. Few crackles at the bases. These diminish with deep breaths. Cardio: regular rate and rhythm, S1, S2 normal, no murmur, click, rub or gallop GI: soft, non-tender; bowel sounds normal; no masses,  no organomegaly Extremities: extremities normal, atraumatic, no cyanosis or edema Pulses: 2+ and symmetric Skin: Skin color, texture, turgor normal. No rashes or lesions Lymph nodes: Cervical, supraclavicular, and axillary nodes normal. Neurologic: Awake and alert. Oriented 3. No focal neurological deficits.  Laboratory Data: Results for orders placed or performed during the hospital encounter of 11/16/15 (from the past 48 hour(s))  CBC     Status: None   Collection Time: 11/16/15  7:06 AM  Result Value Ref Range   WBC 7.4 4.0 - 10.5 K/uL  RBC 4.50 4.22 - 5.81 MIL/uL   Hemoglobin 14.0 13.0 - 17.0 g/dL   HCT 42.9 39.0 - 52.0 %   MCV 95.3 78.0 - 100.0 fL   MCH 31.1 26.0 - 34.0 pg   MCHC 32.6 30.0 - 36.0 g/dL   RDW 12.0 11.5 - 15.5 %   Platelets 161 150 - 400 K/uL  Comprehensive metabolic panel     Status: Abnormal   Collection Time: 11/16/15  7:06 AM  Result Value Ref Range   Sodium 141 135 - 145 mmol/L   Potassium 3.9 3.5 - 5.1 mmol/L   Chloride 103 101 - 111 mmol/L   CO2 27 22 - 32 mmol/L   Glucose, Bld 254 (H) 65 - 99 mg/dL   BUN 8 6 - 20 mg/dL   Creatinine, Ser 0.77 0.61 - 1.24 mg/dL   Calcium 8.7 (L) 8.9 - 10.3 mg/dL   Total Protein 7.2 6.5 - 8.1 g/dL   Albumin 4.3 3.5 - 5.0 g/dL   AST 18 15 - 41 U/L   ALT 33 17 - 63 U/L   Alkaline Phosphatase 48 38 - 126 U/L   Total Bilirubin 0.7 0.3 - 1.2 mg/dL   GFR calc non Af Amer >60 >60 mL/min   GFR calc Af Amer >60 >60 mL/min    Comment: (NOTE) The eGFR has been calculated using the CKD EPI equation. This calculation has not been validated in all clinical situations. eGFR's persistently <60 mL/min signify possible Chronic Kidney Disease.    Anion gap 11 5 - 15  I-stat troponin, ED (not at [facility])     Status: None   Collection Time: 11/16/15  7:09 AM  Result Value Ref Range   Troponin i, poc 0.00 0.00 - 0.08 ng/mL   Comment 3            Comment: Due to the release kinetics of cTnI, a negative result within the first hours of the onset of symptoms does not rule out myocardial infarction with certainty. If myocardial infarction is still suspected, repeat the test at appropriate intervals.   Brain natriuretic peptide     Status: None   Collection Time: 11/16/15  7:27 AM  Result Value Ref Range   B Natriuretic Peptide 46.1 0.0 - 100.0 pg/mL  Glucose, capillary     Status: Abnormal   Collection Time: 11/16/15  1:35 PM  Result Value Ref Range   Glucose-Capillary 314 (H) 65 - 99 mg/dL    Radiology Reports: Dg Chest 2 View  11/16/2015  CLINICAL DATA:  Shortness of breath, cough. EXAM: CHEST  2 VIEW COMPARISON:  November 06, 2015. FINDINGS: Stable left basilar scarring is noted. No acute pulmonary disease is noted. Bony thorax is unremarkable. No pneumothorax or pleural effusion is noted. IMPRESSION: Stable left basilar scarring. No acute cardiopulmonary abnormality seen. Electronically Signed   By: Marijo Conception, M.D.   On: 11/16/2015 08:21    My interpretation of Electrocardiogram: Sinus rhythm with first degree AV block. Normal axis. No concerning ST or T-wave changes.  Problem List  Principal Problem:   Acute exacerbation of chronic obstructive pulmonary disease (COPD) ([place]) Active Problems:   Diabetes mellitus type 2 with atherosclerosis of arteries of extremities ([facility])   COPD exacerbation ([facility])   Assessment: This is a 75 year old African-American male with a past medical history of COPD who was recently hospitalized for pneumonia presents with worsening shortness of breath and wheezing over the last few days. Chest x-ray does not  show any infiltrates. He appears to have COPD exacerbation. His oxygen saturations dropped with ambulation. He does not use home oxygen. BNP  is normal.  Plan: #1 Acute COPD exacerbation with exertional hypoxia and acute respiratory failure: He will be hospitalized for further management. He'll be given nebulizer treatments, steroids. No need for antibiotics as he recently completed a course. Oxygen will be continued. Will need to check room air saturations prior to discharge, once his respiratory status has improved. During previous hospitalization Influenza was checked and was negative.  #2 Diabetes mellitus type 2 with atherosclerosis of the arteries of extremities: Continue with sliding scale coverage. Check CBGs. Hold his oral agents.  DVT Prophylaxis: Lovenox Code Status: Full code Family Communication: Discussed with the patient  Disposition Plan: Admit to MedSurg   Further management decisions will depend on results of further testing and patient's response to treatment.   [facility]  [facility] Pager [phone]  If 7PM-7AM, please contact night-coverage [email or web address] Password [email or web address]  11/16/2015, 2:35 PM

## 2015-11-16 NOTE — ED Notes (Signed)
Pt states he became short of breath a few days ago, no relief with inhaler or breathing treatments, pt has had recent pneumonia

## 2015-11-17 LAB — BASIC METABOLIC PANEL
Anion gap: 9 (ref 5–15)
BUN: 15 mg/dL (ref 6–20)
CALCIUM: 9 mg/dL (ref 8.9–10.3)
CHLORIDE: 102 mmol/L (ref 101–111)
CO2: 26 mmol/L (ref 22–32)
CREATININE: 0.82 mg/dL (ref 0.61–1.24)
GFR calc Af Amer: >60 mL/min (ref >60)
GFR calc non Af Amer: >60 mL/min (ref >60)
Glucose, Bld: 312 mg/dL — ABNORMAL HIGH (ref 65–99)
Potassium: 4.7 mmol/L (ref 3.5–5.1)
SODIUM: 137 mmol/L (ref 135–145)

## 2015-11-17 LAB — CBC
HEMATOCRIT: 40.8 % (ref 39.0–52.0)
Hemoglobin: 13.6 g/dL (ref 13.0–17.0)
MCH: 31.3 pg (ref 26.0–34.0)
MCHC: 33.3 g/dL (ref 30.0–36.0)
MCV: 93.8 fL (ref 78.0–100.0)
Platelets: 184 10*3/uL (ref 150–400)
RBC: 4.35 MIL/uL (ref 4.22–5.81)
RDW: 11.7 % (ref 11.5–15.5)
WBC: 16 10*3/uL — AB (ref 4.0–10.5)

## 2015-11-17 LAB — GLUCOSE, CAPILLARY
GLUCOSE-CAPILLARY: 251 mg/dL — AB (ref 65–99)
GLUCOSE-CAPILLARY: 345 mg/dL — AB (ref 65–99)
Glucose-Capillary: 330 mg/dL — ABNORMAL HIGH (ref 65–99)
Glucose-Capillary: 333 mg/dL — ABNORMAL HIGH (ref 65–99)

## 2015-11-17 MED ORDER — GUAIFENESIN ER 600 MG PO TB12
600.0000 mg | ORAL_TABLET | Freq: Two times a day (BID) | ORAL | Status: DC
  Administered 2015-11-17 – 2015-11-19 (×5): 600 mg via ORAL
  Filled 2015-11-17 (×6): qty 1

## 2015-11-17 MED ORDER — METHYLPREDNISOLONE SODIUM SUCC 125 MG IJ SOLR
60.0000 mg | Freq: Two times a day (BID) | INTRAMUSCULAR | Status: DC
  Administered 2015-11-17 – 2015-11-18 (×2): 60 mg via INTRAVENOUS
  Filled 2015-11-17 (×4): qty 0.96

## 2015-11-17 MED ORDER — IPRATROPIUM-ALBUTEROL 0.5-2.5 (3) MG/3ML IN SOLN
3.0000 mL | Freq: Three times a day (TID) | RESPIRATORY_TRACT | Status: DC
  Administered 2015-11-17 (×3): 3 mL via RESPIRATORY_TRACT
  Filled 2015-11-17 (×3): qty 3

## 2015-11-17 NOTE — Progress Notes (Signed)
RT placed patient on CPAP. RT filled humidifier with water, and bled 2L O2  Into circuit. Pt is resting comfortably with O2 Sat of 95%.

## 2015-11-17 NOTE — Progress Notes (Signed)
Pt placed on CPAP qhs.  Machine plugged into red outlet, humidifier filled with sterile water, and 2Lpm O2 bled into circuit.  Machine set on automode 5-20 cm H2O.  Pt resting stable and comfortable.

## 2015-11-17 NOTE — Progress Notes (Signed)
[facility] PROGRESS NOTE  Jason [name] [id number] DOB: [birth year] DOA: 11/16/2015  PCP: Scarlette Calico, MD  Brief HPI: 75 year old African-American male with a past medical history of diabetes, COPD, tobacco abuse with recent hospitalization for community-acquired pneumonia, presented with shortness of breath and wheezing. He was hospitalized for further management of COPD exacerbation.  Past medical history:  Past Medical History  Diagnosis Date  . Unspecified sinusitis (chronic)   . Mitral valve disorders   . Unspecified venous (peripheral) insufficiency   . Type II or unspecified type diabetes mellitus without mention of complication, not stated as uncontrolled   . Obesity, unspecified   . Esophageal reflux   . Diverticulosis of colon (without mention of hemorrhage)   . Irritable bowel syndrome   . Benign neoplasm of colon   . History of BPH   . History of gynecomastia     Unilateral  . DJD (degenerative joint disease)   . Hidradenitis   . MVP (mitral valve prolapse)   . Pneumonia, organism unspecified 2009  . Depression   . Sleep apnea     does not use  cpap or bipap .. no study  ever  . Heart murmur 20 yrs at least.     mitral valve prolapse.   . Allergy   . Asthma     in past, no inhaler now  . Glaucoma   . Hyperlipidemia     Consultants: None  Procedures: None  Antibiotics: None  Subjective: Patient feels better this morning. Wheezing is improved. He is breathing better. No chest pain. No nausea, vomiting.  Objective: Vital Signs  Filed Vitals:   11/16/15 2115 11/16/15 2219 11/17/15 0510 11/17/15 0847  BP: 116/60  133/63   Pulse: 89  82   Temp: 98.1 F (36.7 C)  98.1 F (36.7 C)   TempSrc: Oral  Oral   Resp: 18  18   Height:      Weight:      SpO2: 94% 96% 94% 94%    Intake/Output Summary (Last 24 hours) at 11/17/15 1137 Last data filed at 11/17/15 0900  Gross per 24 hour  Intake   1315 ml  Output   1100 ml  Net    215 ml    Filed Weights   11/16/15 1223  Weight: 142.429 kg (314 lb)    General appearance: alert, cooperative, appears stated age and no distress Resp: Improved air entry bilaterally. Wheezing is present but much less compared to yesterday. No crackles. Cardio: regular rate and rhythm, S1, S2 normal, no murmur, click, rub or gallop GI: soft, non-tender; bowel sounds normal; no masses,  no organomegaly Extremities: extremities normal, atraumatic, no cyanosis or edema Neurologic: Alert and oriented X 3, normal strength and tone. Normal symmetric reflexes. Normal coordination and gait  Lab Results:  Basic Metabolic Panel:  Recent Labs Lab 11/16/15 0706 11/17/15 0610  NA 141 137  K 3.9 4.7  CL 103 102  CO2 27 26  GLUCOSE 254* 312*  BUN 8 15  CREATININE 0.77 0.82  CALCIUM 8.7* 9.0   Liver Function Tests:  Recent Labs Lab 11/16/15 0706  AST 18  ALT 33  ALKPHOS 48  BILITOT 0.7  PROT 7.2  ALBUMIN 4.3   No results for input(s): LIPASE, AMYLASE in the last 168 hours. No results for input(s): AMMONIA in the last 168 hours. CBC:  Recent Labs Lab 11/16/15 0706 11/17/15 0610  WBC 7.4 16.0*  HGB 14.0 13.6  HCT 42.9 40.8  MCV 95.3 93.8  PLT 161 184   Cardiac Enzymes: No results for input(s): CKTOTAL, CKMB, CKMBINDEX, TROPONINI in the last 168 hours. BNP (last 3 results)  Recent Labs  11/16/15 0727  BNP 46.1    ProBNP (last 3 results) No results for input(s): PROBNP in the last 8760 hours.  CBG:  Recent Labs Lab 11/16/15 1335 11/16/15 1736 11/16/15 2126 11/17/15 0722  GLUCAP 314* 330* 286* 251*    No results found for this or any previous visit (from the past 240 hour(s)).    Studies/Results: Dg Chest 2 View  11/16/2015  CLINICAL DATA:  Shortness of breath, cough. EXAM: CHEST  2 VIEW COMPARISON:  November 06, 2015. FINDINGS: Stable left basilar scarring is noted. No acute pulmonary disease is noted. Bony thorax is unremarkable. No pneumothorax or pleural  effusion is noted. IMPRESSION: Stable left basilar scarring. No acute cardiopulmonary abnormality seen. Electronically Signed   By: Marijo Conception, M.D.   On: 11/16/2015 08:21    Medications:  Scheduled: . aspirin EC  81 mg Oral Daily  . budesonide (PULMICORT) nebulizer solution  0.25 mg Nebulization BID  . enoxaparin (LOVENOX) injection  70 mg Subcutaneous Q24H  . guaiFENesin  600 mg Oral BID  . insulin aspart  0-15 Units Subcutaneous TID WC  . insulin aspart  0-5 Units Subcutaneous QHS  . ipratropium-albuterol  3 mL Nebulization TID  . methylPREDNISolone (SOLU-MEDROL) injection  60 mg Intravenous Q12H  . simvastatin  40 mg Oral q1800   Continuous:  [id number] **OR** acetaminophen, albuterol, ondansetron **OR** ondansetron (ZOFRAN) IV  Assessment/Plan:  Principal Problem:   Acute exacerbation of chronic obstructive pulmonary disease (COPD) (HCC) Active Problems:   Diabetes mellitus type 2 with atherosclerosis of arteries of extremities (HCC)   COPD exacerbation (HCC)    Acute COPD exacerbation with exertional hypoxia and acute respiratory failure Patient is significantly improved. Taper systemic steroids. Continue nebulizer treatments. Continue nebulized steroid as well. Ambulate and check room air saturations. During previous hospitalization Influenza was checked and was negative. He recently completed a course of antibiotics for community-acquired pneumonia. No infiltrates noted on chest x-ray during this admission. Elevation in WBC is due to steroids.  Diabetes mellitus type 2 with atherosclerosis of the arteries of extremities Continue with sliding scale coverage. Check CBGs. Holding his oral agents. CBGs noted to be significantly elevated. This is due to steroids. We are tapering down steroids today. Continue to monitor.   DVT Prophylaxis: Lovenox    Code Status: Full code  Family Communication: Discussed with the patient  Disposition Plan: Patient is improving.  Changes to medications as above. Anticipate discharge tomorrow. We'll need to check room air saturations with ambulation.    LOS: 1 day   [place] [facility] Pager [phone] 11/17/2015, 11:37 AM  If 7PM-7AM, please contact night-coverage at [email or web address], password [facility]

## 2015-11-17 NOTE — Progress Notes (Signed)
Pt ambulated 720 feet and maintained O2 sat of 97% on room air.

## 2015-11-18 ENCOUNTER — Other Ambulatory Visit: Payer: Self-pay | Admitting: *Deleted

## 2015-11-18 ENCOUNTER — Inpatient Hospital Stay (HOSPITAL_COMMUNITY): Payer: Commercial Managed Care - HMO

## 2015-11-18 DIAGNOSIS — R079 Chest pain, unspecified: Secondary | ICD-10-CM

## 2015-11-18 LAB — CBC
HEMATOCRIT: 43.1 % (ref 39.0–52.0)
HEMOGLOBIN: 14.1 g/dL (ref 13.0–17.0)
MCH: 31.6 pg (ref 26.0–34.0)
MCHC: 32.7 g/dL (ref 30.0–36.0)
MCV: 96.6 fL (ref 78.0–100.0)
Platelets: 232 10*3/uL (ref 150–400)
RBC: 4.46 MIL/uL (ref 4.22–5.81)
RDW: 12 % (ref 11.5–15.5)
WBC: 16.6 10*3/uL — ABNORMAL HIGH (ref 4.0–10.5)

## 2015-11-18 LAB — COMPREHENSIVE METABOLIC PANEL
ALBUMIN: 3.9 g/dL (ref 3.5–5.0)
ALK PHOS: 44 U/L (ref 38–126)
ALT: 37 U/L (ref 17–63)
AST: 21 U/L (ref 15–41)
Anion gap: 8 (ref 5–15)
BILIRUBIN TOTAL: 1 mg/dL (ref 0.3–1.2)
BUN: 17 mg/dL (ref 6–20)
CALCIUM: 9.4 mg/dL (ref 8.9–10.3)
CO2: 31 mmol/L (ref 22–32)
CREATININE: 1.12 mg/dL (ref 0.61–1.24)
Chloride: 102 mmol/L (ref 101–111)
GFR calc Af Amer: >60 mL/min (ref >60)
GFR calc non Af Amer: >60 mL/min (ref >60)
GLUCOSE: 225 mg/dL — AB (ref 65–99)
Potassium: 4.7 mmol/L (ref 3.5–5.1)
SODIUM: 141 mmol/L (ref 135–145)
Total Protein: 7.3 g/dL (ref 6.5–8.1)

## 2015-11-18 LAB — GLUCOSE, CAPILLARY
GLUCOSE-CAPILLARY: 208 mg/dL — AB (ref 65–99)
GLUCOSE-CAPILLARY: 282 mg/dL — AB (ref 65–99)
GLUCOSE-CAPILLARY: 283 mg/dL — AB (ref 65–99)
Glucose-Capillary: 233 mg/dL — ABNORMAL HIGH (ref 65–99)
Glucose-Capillary: 303 mg/dL — ABNORMAL HIGH (ref 65–99)
Glucose-Capillary: 335 mg/dL — ABNORMAL HIGH (ref 65–99)
Glucose-Capillary: 400 mg/dL — ABNORMAL HIGH (ref 65–99)

## 2015-11-18 LAB — TROPONIN I
Troponin I: <0.03 ng/mL (ref <0.031)
Troponin I: <0.03 ng/mL (ref <0.031)

## 2015-11-18 MED ORDER — ALBUTEROL SULFATE (2.5 MG/3ML) 0.083% IN NEBU: 2.5000 mg | INHALATION_SOLUTION | RESPIRATORY_TRACT | Status: DC | PRN

## 2015-11-18 MED ORDER — GI COCKTAIL ~~LOC~~
30.0000 mL | Freq: Once | ORAL | Status: AC
  Administered 2015-11-18: 30 mL via ORAL
  Filled 2015-11-18: qty 30

## 2015-11-18 MED ORDER — ONDANSETRON 4 MG PO TBDP: 4.0000 mg | ORAL_TABLET | Freq: Three times a day (TID) | ORAL | Status: DC | PRN

## 2015-11-18 MED ORDER — BUDESONIDE-FORMOTEROL FUMARATE 80-4.5 MCG/ACT IN AERO: 2.0000 | INHALATION_SPRAY | Freq: Every day | RESPIRATORY_TRACT | Status: DC

## 2015-11-18 MED ORDER — MORPHINE SULFATE (PF) 2 MG/ML IV SOLN
1.0000 mg | Freq: Once | INTRAVENOUS | Status: AC
  Administered 2015-11-18: 1 mg via INTRAVENOUS
  Filled 2015-11-18: qty 1

## 2015-11-18 MED ORDER — PANTOPRAZOLE SODIUM 40 MG PO TBEC
40.0000 mg | DELAYED_RELEASE_TABLET | Freq: Two times a day (BID) | ORAL | Status: DC
  Administered 2015-11-18 – 2015-11-19 (×2): 40 mg via ORAL
  Filled 2015-11-18 (×3): qty 1

## 2015-11-18 MED ORDER — PREDNISONE 20 MG PO TABS: ORAL_TABLET | ORAL | Status: DC

## 2015-11-18 MED ORDER — PANTOPRAZOLE SODIUM 40 MG IV SOLR
40.0000 mg | Freq: Once | INTRAVENOUS | Status: AC
  Administered 2015-11-18: 40 mg via INTRAVENOUS
  Filled 2015-11-18: qty 40

## 2015-11-18 MED ORDER — INSULIN ASPART 100 UNIT/ML ~~LOC~~ SOLN
0.0000 [IU] | SUBCUTANEOUS | Status: DC
  Administered 2015-11-18: 15 [IU] via SUBCUTANEOUS
  Administered 2015-11-18: 8 [IU] via SUBCUTANEOUS
  Administered 2015-11-18: 5 [IU] via SUBCUTANEOUS
  Administered 2015-11-18: 15 [IU] via SUBCUTANEOUS
  Administered 2015-11-18: 8 [IU] via SUBCUTANEOUS
  Administered 2015-11-19 (×2): 3 [IU] via SUBCUTANEOUS
  Administered 2015-11-19: 5 [IU] via SUBCUTANEOUS

## 2015-11-18 MED ORDER — PREDNISONE 10 MG PO TABS
60.0000 mg | ORAL_TABLET | Freq: Every day | ORAL | Status: DC
  Administered 2015-11-19: 60 mg via ORAL
  Filled 2015-11-18: qty 1

## 2015-11-18 NOTE — Progress Notes (Signed)
[facility] PROGRESS NOTE  Jason [name] [id number] DOB: 12-Oct-1941 DOA: 11/16/2015  PCP: Scarlette Calico, MD  Brief HPI: 75 year old African-American male with a past medical history of diabetes, COPD, tobacco abuse with recent hospitalization for community-acquired pneumonia, presented with shortness of breath and wheezing. He was hospitalized for further management of COPD exacerbation. Discharge had to be cancelled due to chest pain.  Past medical history:  Past Medical History  Diagnosis Date  . Unspecified sinusitis (chronic)   . Mitral valve disorders   . Unspecified venous (peripheral) insufficiency   . Type II or unspecified type diabetes mellitus without mention of complication, not stated as uncontrolled   . Obesity, unspecified   . Esophageal reflux   . Diverticulosis of colon (without mention of hemorrhage)   . Irritable bowel syndrome   . Benign neoplasm of colon   . History of BPH   . History of gynecomastia     Unilateral  . DJD (degenerative joint disease)   . Hidradenitis   . MVP (mitral valve prolapse)   . Pneumonia, organism unspecified 2009  . Depression   . Sleep apnea     does not use  cpap or bipap .. no study  ever  . Heart murmur 20 yrs at least.     mitral valve prolapse.   . Allergy   . Asthma     in past, no inhaler now  . Glaucoma   . Hyperlipidemia     Consultants: None  Procedures: None  Antibiotics: None  Subjective: During initial assessment, patient reported some nausea overnight. No vomiting. He was feeling better from breathing standpoint.   I was subsequently called by the nurse when she was getting ready to discharge and the patient started complaining of left-sided chest pain. This was a dull achy pain on the left side with no radiation. 5/5 in intensity. Patient was examined at bedside. He was given medications including GI cocktail and Protonix. He does report a history of acid reflux. With these measures the  patient's symptoms resolved.   Objective: Vital Signs  Filed Vitals:   11/17/15 2047 11/17/15 2203 11/18/15 0513 11/18/15 0949  BP:  125/58 133/66 137/80  Pulse:  84 78 68  Temp:  98.6 F (37 C) 98.5 F (36.9 C)   TempSrc:  Oral Oral   Resp:  16 16 16   Height:      Weight:      SpO2: 94% 95% 95% 96%    Intake/Output Summary (Last 24 hours) at 11/18/15 1129 Last data filed at 11/18/15 0902  Gross per 24 hour  Intake   1080 ml  Output      0 ml  Net   1080 ml   Filed Weights   11/16/15 1223  Weight: 142.429 kg (314 lb)    General appearance: alert, cooperative, appears stated age and no distress Resp: Lungs are auscultation bilaterally. No wheezing is present at this time. No crackles. Cardio: regular rate and rhythm, S1, S2 normal, no murmur, click, rub or gallop GI: soft, non-tender; bowel sounds normal; no masses,  no organomegaly Extremities: extremities normal, atraumatic, no cyanosis or edema Neurologic: Alert and oriented X 3, normal strength and tone. Normal symmetric reflexes. Normal coordination and gait  Lab Results:  Basic Metabolic Panel:  Recent Labs Lab 11/16/15 0706 11/17/15 0610 11/18/15 1016  NA 141 137 141  K 3.9 4.7 4.7  CL 103 102 102  CO2 27 26 31   GLUCOSE 254* 312* 225*  BUN 8 15 17   CREATININE 0.77 0.82 1.12  CALCIUM 8.7* 9.0 9.4   Liver Function Tests:  Recent Labs Lab 11/16/15 0706 11/18/15 1016  AST 18 21  ALT 33 37  ALKPHOS 48 44  BILITOT 0.7 1.0  PROT 7.2 7.3  ALBUMIN 4.3 3.9   CBC:  Recent Labs Lab 11/16/15 0706 11/17/15 0610 11/18/15 1016  WBC 7.4 16.0* 16.6*  HGB 14.0 13.6 14.1  HCT 42.9 40.8 43.1  MCV 95.3 93.8 96.6  PLT 161 184 232   Cardiac Enzymes:  Recent Labs Lab 11/18/15 1016  TROPONINI <0.03   BNP (last 3 results)  Recent Labs  11/16/15 0727  BNP 46.1    CBG:  Recent Labs Lab 11/17/15 1141 11/17/15 1725 11/18/15 0039 11/18/15 0518 11/18/15 0752  GLUCAP 333* 345* 335* 282*  283*    No results found for this or any previous visit (from the past 240 hour(s)).    Studies/Results: No results found.  Medications:  Scheduled: . aspirin EC  81 mg Oral Daily  . budesonide (PULMICORT) nebulizer solution  0.25 mg Nebulization BID  . enoxaparin (LOVENOX) injection  70 mg Subcutaneous Q24H  . guaiFENesin  600 mg Oral BID  . insulin aspart  0-15 Units Subcutaneous Q4H  . methylPREDNISolone (SOLU-MEDROL) injection  60 mg Intravenous Q12H  . simvastatin  40 mg Oral q1800   Continuous:  [id number] **OR** acetaminophen, albuterol, ondansetron **OR** ondansetron (ZOFRAN) IV  Assessment/Plan:  Principal Problem:   Acute exacerbation of chronic obstructive pulmonary disease (COPD) (HCC) Active Problems:   Diabetes mellitus type 2 with atherosclerosis of arteries of extremities (HCC)   COPD exacerbation (HCC)    Chest pain EKG does not show any acute ischemic changes. Sinus rhythm is present. Patient's symptoms resolved with morphine, PPI, GI cocktail. He does give a history of acid reflux. He was experiencing nausea this morning. Hence, GI etiology is thought to be the main reason for his symptoms. Chest x-ray is pending. First troponin is normal. Continue to cycle troponins. Discussed with patient. Continue to monitor closely.  Acute COPD exacerbation with exertional hypoxia and acute respiratory failure Patient is significantly improved. Taper systemic steroids. Continue nebulizer treatments as needed. Continue nebulized steroid as well. Room air saturations are normal with ambulation. During previous hospitalization Influenza was checked and was negative. He recently completed a course of antibiotics for community-acquired pneumonia. No infiltrates noted on chest x-ray during this admission. Elevation in WBC is due to steroids.  Diabetes mellitus type 2 with atherosclerosis of the arteries of extremities CBGs are poorly controlled due to steroids. Should  improve as steroids are tapered down. Continue with sliding scale coverage. Check CBGs. Holding his oral agents.   DVT Prophylaxis: Lovenox    Code Status: Full code  Family Communication: Discussed with the patient  Disposition Plan: Plan was for discharge today, however, patient developed chest pain as discussed above. Anticipate discharge tomorrow.    LOS: 2 days   [place] [facility] Pager [phone] 11/18/2015, 11:29 AM  If 7PM-7AM, please contact night-coverage at [email or web address], password [facility]

## 2015-11-18 NOTE — Patient Outreach (Signed)
[employer] [facility]) Care Management  11/18/2015  OSAZE KLEINFELDT [birth year] [id number]   Transition of care (week 2). Note pt currently inpt status   RN spoke with pt today who indicates he is doing well and reports his recent event resulting in a hospitalization related to symptoms reported of a "trace of COPD" by pt. Pt states his mother recently passed away and he is recovering from that incident very well recalling good memories of her in his pass. States he is currently in the hospital and will be released tomorrow home due to his cardiac history pt will stay one addition night tonight. Pt verified the initial home visit that has been scheduled for Friday will remain with this [facility] nurse with no changes. RN will review the discharge orders at this time and restart the Transition of care program from the pending discharge date. RN will continue to follow this pt for [facility] service.  Raina Mina, RN Care Management Coordinator [place] Office [phone]

## 2015-11-18 NOTE — Progress Notes (Signed)
After discharge instructions were given, nurse returned to take IV out. Pt in significant distress. Rated chest pain below left nipple line at a 6/10. Vitals 137/80, P 68, RR 16, O2 sat 96%. EKG abnormal. MD notified with new orders to admit to telemetry, obtain STAT labs and morphine, gi cocktail, protonix. Just gave report to [facility] and pt to transfer to room 1420.

## 2015-11-18 NOTE — Progress Notes (Signed)
Discharge instructions gone over with pt with all questions answered. Pt voices no needs prior to discharge.

## 2015-11-18 NOTE — Progress Notes (Signed)
Patient refused CPAP for tonight 

## 2015-11-19 ENCOUNTER — Other Ambulatory Visit: Payer: Self-pay | Admitting: *Deleted

## 2015-11-19 DIAGNOSIS — I70209 Unspecified atherosclerosis of native arteries of extremities, unspecified extremity: Secondary | ICD-10-CM

## 2015-11-19 DIAGNOSIS — E1169 Type 2 diabetes mellitus with other specified complication: Secondary | ICD-10-CM

## 2015-11-19 DIAGNOSIS — J441 Chronic obstructive pulmonary disease with (acute) exacerbation: Principal | ICD-10-CM

## 2015-11-19 DIAGNOSIS — E1159 Type 2 diabetes mellitus with other circulatory complications: Secondary | ICD-10-CM

## 2015-11-19 DIAGNOSIS — E785 Hyperlipidemia, unspecified: Secondary | ICD-10-CM

## 2015-11-19 LAB — GLUCOSE, CAPILLARY
GLUCOSE-CAPILLARY: 164 mg/dL — AB (ref 65–99)
Glucose-Capillary: 192 mg/dL — ABNORMAL HIGH (ref 65–99)

## 2015-11-19 MED ORDER — PANTOPRAZOLE SODIUM 40 MG PO TBEC: 40.0000 mg | DELAYED_RELEASE_TABLET | Freq: Every day | ORAL | Status: DC

## 2015-11-19 NOTE — Patient Outreach (Signed)
[facility]) Care Management  11/19/2015  JSON GERSH 1941/11/05 [id number]  Initial Transition of care (week 1)  RN spoke with pt today and reintroduced the [facility] program and services. Review pt's initial goals and plan of care and adjusted accordingly based upon pt's being receptive to the COPD action plan and awareness if symptoms should occur. Note the initial home visit has been scheduled for this week as RN will completed the needed assessments.   Raina Mina, RN Care Management Coordinator [place] Office [phone]

## 2015-11-19 NOTE — Discharge Instructions (Signed)
Chronic Obstructive Pulmonary Disease Chronic obstructive pulmonary disease (COPD) is a common lung condition in which airflow from the lungs is limited. COPD is a general term that can be used to describe many different lung problems that limit airflow, including both chronic bronchitis and emphysema. If you have COPD, your lung function will probably never return to normal, but there are measures you can take to improve lung function and make yourself feel better. CAUSES   Smoking (common).  Exposure to secondhand smoke.  Genetic problems.  Chronic inflammatory lung diseases or recurrent infections. SYMPTOMS  Shortness of breath, especially with physical activity.  Deep, persistent (chronic) cough with a large amount of thick mucus.  Wheezing.  Rapid breaths (tachypnea).  Gray or bluish discoloration (cyanosis) of the skin, especially in your fingers, toes, or lips.  Fatigue.  Weight loss.  Frequent infections or episodes when breathing symptoms become much worse (exacerbations).  Chest tightness. DIAGNOSIS Your health care provider will take a medical history and perform a physical examination to diagnose COPD. Additional tests for COPD may include:  Lung (pulmonary) function tests.  Chest X-ray.  CT scan.  Blood tests. TREATMENT  Treatment for COPD may include:  Inhaler and nebulizer medicines. These help manage the symptoms of COPD and make your breathing more comfortable.  Supplemental oxygen. Supplemental oxygen is only helpful if you have a low oxygen level in your blood.  Exercise and physical activity. These are beneficial for nearly all people with COPD.  Lung surgery or transplant.  Nutrition therapy to gain weight, if you are underweight.  Pulmonary rehabilitation. This may involve working with a team of health care providers and specialists, such as respiratory, occupational, and physical therapists. HOME CARE INSTRUCTIONS  Take all medicines  (inhaled or pills) as directed by your health care provider.  Avoid over-the-counter medicines or cough syrups that dry up your airway (such as antihistamines) and slow down the elimination of secretions unless instructed otherwise by your health care provider.  If you are a smoker, the most important thing that you can do is stop smoking. Continuing to smoke will cause further lung damage and breathing trouble. Ask your health care provider for help with quitting smoking. He or she can direct you to community resources or hospitals that provide support.  Avoid exposure to irritants such as smoke, chemicals, and fumes that aggravate your breathing.  Use oxygen therapy and pulmonary rehabilitation if directed by your health care provider. If you require home oxygen therapy, ask your health care provider whether you should purchase a pulse oximeter to measure your oxygen level at home.  Avoid contact with individuals who have a contagious illness.  Avoid extreme temperature and humidity changes.  Eat healthy foods. Eating smaller, more frequent meals and resting before meals may help you maintain your strength.  Stay active, but balance activity with periods of rest. Exercise and physical activity will help you maintain your ability to do things you want to do.  Preventing infection and hospitalization is very important when you have COPD. Make sure to receive all the vaccines your health care provider recommends, especially the pneumococcal and influenza vaccines. Ask your health care provider whether you need a pneumonia vaccine.  Learn and use relaxation techniques to manage stress.  Learn and use controlled breathing techniques as directed by your health care provider. Controlled breathing techniques include:  Pursed lip breathing. Start by breathing in (inhaling) through your nose for 1 second. Then, purse your lips as if you were   going to whistle and breathe out (exhale) through the  pursed lips for 2 seconds.  Diaphragmatic breathing. Start by putting one hand on your abdomen just above your waist. Inhale slowly through your nose. The hand on your abdomen should move out. Then purse your lips and exhale slowly. You should be able to feel the hand on your abdomen moving in as you exhale.  Learn and use controlled coughing to clear mucus from your lungs. Controlled coughing is a series of short, progressive coughs. The steps of controlled coughing are: 1. Lean your head slightly forward. 2. Breathe in deeply using diaphragmatic breathing. 3. Try to hold your breath for 3 seconds. 4. Keep your mouth slightly open while coughing twice. 5. Spit any mucus out into a tissue. 6. Rest and repeat the steps once or twice as needed. SEEK MEDICAL CARE IF:  You are coughing up more mucus than usual.  There is a change in the color or thickness of your mucus.  Your breathing is more labored than usual.  Your breathing is faster than usual. SEEK IMMEDIATE MEDICAL CARE IF:  You have shortness of breath while you are resting.  You have shortness of breath that prevents you from:  Being able to talk.  Performing your usual physical activities.  You have chest pain lasting longer than 5 minutes.  Your skin color is more cyanotic than usual.  You measure low oxygen saturations for longer than 5 minutes with a pulse oximeter. MAKE SURE YOU:  Understand these instructions.  Will watch your condition.  Will get help right away if you are not doing well or get worse.   This information is not intended to replace advice given to you by your health care provider. Make sure you discuss any questions you have with your health care provider.   Document Released: 08/11/2005 Document Revised: 11/22/2014 Document Reviewed: 06/28/2013 Elsevier Interactive Patient Education 2016 [employer]  

## 2015-11-19 NOTE — Discharge Summary (Signed)
Physician Discharge Summary  Jason [name] [id number] DOB: 04-23-1941 DOA: 11/16/2015  PCP: Scarlette Calico, MD  Admit date: 11/16/2015 Discharge date: 11/19/2015  Recommendations for Outpatient Follow-up:  1. Please follow up with PCP in 1 week to make sure symptoms are controlled   Discharge Diagnoses:  Principal Problem:   Acute exacerbation of chronic obstructive pulmonary disease (COPD) ([place]) Active Problems:   Diabetes mellitus type 2 with atherosclerosis of arteries of extremities (HCC)   COPD exacerbation ([place])    Discharge Condition: stable   Diet recommendation: as tolerated   History of present illness:  75 y.o. male with past medical history of diabetes, COPD, tobacco abuse, mitral valve prolapse, OSA, recent hospitalization for community acquired pneumonia discharged 11/07/2015. He presented to [facility] 11/16/2015 with shortness of breath and wheezing but has not smoked cigarettes in past few days prior to this admission. He has received nebulizer treatments in emergency department but had still some chest tightness and was admitted for further evaluation.   Hospital Course:  Principal Problem:   Acute exacerbation of chronic obstructive pulmonary disease (COPD) ([place]) - Patient is medically stable. Respiratory status is stable. - Patient will continue taking nebulizer treatments as prescribed - Patient does not need oxygen at home.  Active Problems:   Diabetes mellitus type 2 with atherosclerosis of arteries of extremities ([place]) - Continue home medications, metformin and Amaryl    Dyslipidemia associated with diabetes mellitus type 2 - Continue simvastatin on discharge    Signed:  Leisa Lenz, MD  [facility] 11/19/2015, 10:40 AM  Pager #: [phone]  Time spent in minutes: less than 30 minutes   Discharge Exam: Filed Vitals:   11/18/15 2109 11/19/15 0405  BP: 131/59 115/63  Pulse: 77 75  Temp: 98.3 F (36.8 C) 97.8 F (36.6 C)   Resp: 18 19   Filed Vitals:   11/18/15 1149 11/18/15 2109 11/18/15 2138 11/19/15 0405  BP: 137/71 131/59  115/63  Pulse: 75 77  75  Temp:  98.3 F (36.8 C)  97.8 F (36.6 C)  TempSrc:  Oral  Oral  Resp: 16 18  19   Height:      Weight:      SpO2: 96% 96% 98% 94%    General: Pt is alert, follows commands appropriately, not in acute distress Cardiovascular: Regular rate and rhythm, S1/S2 appreciated  Respiratory: Clear to auscultation bilaterally, no wheezing, no crackles, no rhonchi Abdominal: Soft, non tender, non distended, bowel sounds +, no guarding Extremities: no edema, no cyanosis, pulses palpable bilaterally DP and PT Neuro: Grossly nonfocal  Discharge Instructions  Discharge Instructions    Call MD for:  difficulty breathing, headache or visual disturbances    Complete by:  As directed      Call MD for:  difficulty breathing, headache or visual disturbances    Complete by:  As directed      Call MD for:  extreme fatigue    Complete by:  As directed      Call MD for:  persistant dizziness or light-headedness    Complete by:  As directed      Call MD for:  persistant dizziness or light-headedness    Complete by:  As directed      Call MD for:  persistant nausea and vomiting    Complete by:  As directed      Call MD for:  persistant nausea and vomiting    Complete by:  As directed  Call MD for:  severe uncontrolled pain    Complete by:  As directed      Call MD for:  severe uncontrolled pain    Complete by:  As directed      Call MD for:  temperature >100.4    Complete by:  As directed      Diet - low sodium heart healthy    Complete by:  As directed      Diet Carb Modified    Complete by:  As directed      Discharge instructions    Complete by:  As directed   Please talk to your doctor about sleep study to diagnose sleep apnea. Your blood sugars should start improving as your steroid (prednisone) dose is reduced. Please have your doctor check your blood  sugar level.  You were cared for by a hospitalist during your hospital stay. If you have any questions about your discharge medications or the care you received while you were in the hospital after you are discharged, you can call the unit and asked to speak with the hospitalist on call if the hospitalist that took care of you is not available. Once you are discharged, your primary care physician will handle any further medical issues. Please note that NO REFILLS for any discharge medications will be authorized once you are discharged, as it is imperative that you return to your primary care physician (or establish a relationship with a primary care physician if you do not have one) for your aftercare needs so that they can reassess your need for medications and monitor your lab values. If you do not have a primary care physician, you can call [phone] for a physician referral.     Increase activity slowly    Complete by:  As directed      Increase activity slowly    Complete by:  As directed             Medication List    STOP taking these medications        levofloxacin 750 MG tablet  Commonly known as:  LEVAQUIN      TAKE these medications        albuterol 108 (90 Base) MCG/ACT inhaler  Commonly known as:  PROVENTIL HFA;VENTOLIN HFA  Inhale 1-2 puffs into the lungs every 6 (six) hours as needed for wheezing or shortness of breath.     albuterol (2.5 MG/3ML) 0.083% nebulizer solution  Commonly known as:  PROVENTIL  Take 3 mLs (2.5 mg total) by nebulization every 4 (four) hours as needed for wheezing.     aspirin EC 81 MG tablet  Take 1 tablet (81 mg total) by mouth daily.     budesonide-formoterol 80-4.5 MCG/ACT inhaler  Commonly known as:  SYMBICORT  Inhale 2 puffs into the lungs daily.     glimepiride 2 MG tablet  Commonly known as:  AMARYL  Take 1 tablet (2 mg total) by mouth daily with breakfast.     metFORMIN 500 MG tablet  Commonly known as:  GLUCOPHAGE  TAKE 1  TABLET (500 MG TOTAL) BY MOUTH 2 (TWO) TIMES DAILY WITH A MEAL.     ondansetron 4 MG disintegrating tablet  Commonly known as:  ZOFRAN ODT  Take 1 tablet (4 mg total) by mouth every 8 (eight) hours as needed for nausea or vomiting.     pantoprazole 40 MG tablet  Commonly known as:  PROTONIX  Take 1 tablet (40 mg total) by mouth  daily.     predniSONE 20 MG tablet  Commonly known as:  DELTASONE  Take 3 tablets once daily for 3 days, then take 2 tablets once daily for 3 days, then take one tablet once daily for 3 days, then STOP.     simvastatin 40 MG tablet  Commonly known as:  ZOCOR  Take 1 tablet by mouth  every evening     vitamin C 500 MG tablet  Commonly known as:  ASCORBIC ACID  Take 1 tablet (500 mg total) by mouth daily.           Follow-up Information    Follow up with Scarlette Calico, MD. Schedule an appointment as soon as possible for a visit in 1 week.   Specialty:  Internal Medicine   Why:  post hospitalization follow up   Contact information:   [address] [address] [phone]        The results of significant diagnostics from this hospitalization (including imaging, microbiology, ancillary and laboratory) are listed below for reference.    Significant Diagnostic Studies: Dg Chest 2 View  11/16/2015  CLINICAL DATA:  Shortness of breath, cough. EXAM: CHEST  2 VIEW COMPARISON:  November 06, 2015. FINDINGS: Stable left basilar scarring is noted. No acute pulmonary disease is noted. Bony thorax is unremarkable. No pneumothorax or pleural effusion is noted. IMPRESSION: Stable left basilar scarring. No acute cardiopulmonary abnormality seen. Electronically Signed   By: Marijo Conception, M.D.   On: 11/16/2015 08:21   Dg Chest 2 View  11/06/2015  CLINICAL DATA:  Pneumonia EXAM: CHEST  2 VIEW COMPARISON:  10/26/2015 FINDINGS: Chronic scarring at the left lung base. Superimposed left lower lobe pneumonia is not excluded. No pleural effusion or  pneumothorax. Mild cardiomegaly. Visualized osseous structures are within normal limits. IMPRESSION: Chronic left lower lobe scarring. Superimposed left lower lobe pneumonia is not excluded. Electronically Signed   By: Julian Hy M.D.   On: 11/06/2015 11:32   Dg Chest 2 View  11/01/2015  CLINICAL DATA:  Cough for 3 weeks. Coughing up yellow sputum. No chest pain. EXAM: CHEST  2 VIEW COMPARISON:  Chest radiograph 09/24/2015; 05/28/2014. FINDINGS: Stable cardiac and mediastinal contours. No large area of pulmonary consolidation. No pleural effusion or pneumothorax. Regional skeleton is unremarkable. IMPRESSION: No active cardiopulmonary disease. Electronically Signed   By: Lovey Newcomer M.D.   On: 11/01/2015 07:47   Dg Chest Port 1 View  11/18/2015  CLINICAL DATA:  Chest pain.  History of asthma EXAM: PORTABLE CHEST 1 VIEW COMPARISON:  November 16, 2015 FINDINGS: There is stable scarring in the left base. There is mild bibasilar atelectasis. The lungs elsewhere are clear. Heart size and pulmonary vascularity are normal. No adenopathy. No bone lesions. No pneumothorax. IMPRESSION: Scarring left base. Mild bibasilar atelectasis. No frank edema or consolidation. No change in cardiac silhouette. Electronically Signed   By: Lowella Grip III M.D.   On: 11/18/2015 13:01   Dg Chest Port 1 View  11/05/2015  CLINICAL DATA:  Two weeks productive cough, wheezing, shortness of breath, chest tightness. EXAM: PORTABLE CHEST 1 VIEW COMPARISON:  11/01/2015 FINDINGS: Heart is normal size. Increased density at the left lung base could reflect area of pneumonia. Right lung is clear. No effusions. No acute bony abnormality. IMPRESSION: Left basilar airspace opacity could reflect area of pneumonia. Electronically Signed   By: Rolm Baptise M.D.   On: 11/05/2015 13:11    Microbiology: No results found for  this or any previous visit (from the past 240 hour(s)).   Labs: Basic Metabolic Panel:  Recent Labs Lab  11/16/15 0706 11/17/15 0610 11/18/15 1016  NA 141 137 141  K 3.9 4.7 4.7  CL 103 102 102  CO2 27 26 31   GLUCOSE 254* 312* 225*  BUN 8 15 17   CREATININE 0.77 0.82 1.12  CALCIUM 8.7* 9.0 9.4   Liver Function Tests:  Recent Labs Lab 11/16/15 0706 11/18/15 1016  AST 18 21  ALT 33 37  ALKPHOS 48 44  BILITOT 0.7 1.0  PROT 7.2 7.3  ALBUMIN 4.3 3.9   No results for input(s): LIPASE, AMYLASE in the last 168 hours. No results for input(s): AMMONIA in the last 168 hours. CBC:  Recent Labs Lab 11/16/15 0706 11/17/15 0610 11/18/15 1016  WBC 7.4 16.0* 16.6*  HGB 14.0 13.6 14.1  HCT 42.9 40.8 43.1  MCV 95.3 93.8 96.6  PLT 161 184 232   Cardiac Enzymes:  Recent Labs Lab 11/18/15 1016 11/18/15 1556  TROPONINI <0.03 <0.03   BNP: BNP (last 3 results)  Recent Labs  11/16/15 0727  BNP 46.1    ProBNP (last 3 results) No results for input(s): PROBNP in the last 8760 hours.  CBG:  Recent Labs Lab 11/18/15 1657 11/18/15 2107 11/18/15 2356 11/19/15 0423 11/19/15 0746  GLUCAP 400* 303* 208* 192* 164*

## 2015-11-19 NOTE — Progress Notes (Signed)
This RN was informed by Lab that patient was seen picking at the bottom of his right foot. Blood was seen coming from bottom of foot. This RN assessed foot, cleansed it with NS, and covered it with a dry gauze. Will continue to monitor closely.

## 2015-11-19 NOTE — Care Management Important Message (Signed)
Important Message  Patient Details  Name: [name] MRN: [id number] Date of Birth: 04/09/1941   Medicare Important Message Given:  Yes    Camillo Flaming 11/19/2015, 10:26 AMImportant Message  Patient Details  Name: [name] MRN: [id number] Date of Birth: May 02, 1941   Medicare Important Message Given:  Yes    Camillo Flaming 11/19/2015, 10:26 AM

## 2015-11-20 ENCOUNTER — Telehealth: Payer: Self-pay | Admitting: *Deleted

## 2015-11-20 DIAGNOSIS — J441 Chronic obstructive pulmonary disease with (acute) exacerbation: Secondary | ICD-10-CM | POA: Diagnosis not present

## 2015-11-20 NOTE — Telephone Encounter (Signed)
Transition Care Management Follow-up Telephone Call   Date discharged? 11/19/15   How have you been since you were released from the hospital? Spoke with patient wife she states he is doing ok   Do you understand why you were in the hospital? YES   Do you understand the discharge instructions? YES   Where were you discharged to? Home   Items Reviewed:  Medications reviewed: YES  Allergies reviewed: YES  Dietary changes reviewed: YES  Referrals reviewed: No referral needed   Functional Questionnaire:   Activities of Daily Living (ADLs):   She states he are independent in the following: ambulation, bathing and hygiene, feeding, continence, grooming, toileting and dressing States he doesn't need assistant    Any transportation issues/concerns?: NO   Any patient concerns? NO   Confirmed importance and date/time of follow-up visits scheduled YES, appt 11/24/14  Provider Appointment booked with Dr. Ronnald Ramp   Confirmed with patient if condition begins to worsen call PCP or go to the ER.  Patient was given the office number and encouraged to call back with question or concerns.  : YES

## 2015-11-21 ENCOUNTER — Other Ambulatory Visit: Payer: Self-pay | Admitting: *Deleted

## 2015-11-21 VITALS — BP 118/60 | HR 81 | Resp 20 | Ht >= 80 in | Wt 314.4 lb

## 2015-11-21 DIAGNOSIS — J441 Chronic obstructive pulmonary disease with (acute) exacerbation: Secondary | ICD-10-CM

## 2015-11-21 NOTE — Patient Outreach (Addendum)
[facility]) Care Management   11/21/2015  Jason [name] 1941/04/15 [id number]  Jason [name] is an 75 y.o. male [facility] introduce as pt remains receptive to enrollment. Subjective:   COPD: Pt reports his limited knowledge on his new medical condition (December diagnosed) but not sure of how to manage his COPD but receptive to the provided education and tools for daily monitoring provided to pt today.  Pt verbalized an understanding once educated on his medical condition and willing to preform daily monitoring in better managing his medical condition. Pt verified Dr. Lenna Gilford as his pulmonologist and has a post-op hospital office visit with Dr. Ronnald Ramp next week. Pt also has  Incentive spirometry but not sure of the purpose and correct use of the device for exercising his lungs. MEDICATION: Pt states one of his discharge medication he was not able to fill due to the expense of the medication ($98 Symbicort). Therefore pt has not administered this medication since prescribed. Pt denies any difficulty breathing with no SOB or distressful breathing.  Pt has verified all other COPD medication (inhaler and nebulizer) continues to be taken accordingly to his providers recommendations with no problems.  Pt has Dr. Lenna Gilford has his pulmonologist however no upcoming appointment has been arranged to request assist with his Symbicort and pt usually goes through mail order with [facility] for his medications however this medication was called into his local pharmarcy to start as soon as possible. Although this medication is to expensive at this time pt indicated he would continue to purchase this medication and take as instructed by his providers if assist was offered at this time.  RESPIRATORY: Pt states he has an incentive spiromerty however does not feel his is using the device properly. Wife states pt is a mouth breather and needs to learn how to breath through his nose. Pt states he can "feel the difference"  after correct technique and demonstration showed on how to use the device and perform purse lip breathing.  Pt also indicates he is currently on Prednisone and that has caused his "blood sugars" to increase reported one read over 400 but states this was much improved after rest, toast and hydration. Pt inquired is there another medications that his doctor needs him to take to reduce his CBG.  Objective:   Review of Systems  Constitutional: Negative.   HENT: Negative.   Eyes: Negative.   Respiratory: Positive for cough. Negative for sputum production.        Productive white mucus  Cardiovascular: Negative.   Gastrointestinal: Negative.   Genitourinary: Negative.   Musculoskeletal: Negative.   Skin: Negative.   Neurological: Negative.   Endo/Heme/Allergies: Negative.   Psychiatric/Behavioral: Negative.     Physical Exam  Constitutional: He is oriented to person, place, and time. He appears well-developed and well-nourished.  HENT:  Right Ear: External ear normal.  Left Ear: External ear normal.  Eyes: EOM are normal.  Neck: Normal range of motion.  Cardiovascular: Normal rate.   Respiratory: Effort normal and breath sounds normal.  GI: Soft. Bowel sounds are normal.  Musculoskeletal: Normal range of motion.  Neurological: He is alert and oriented to person, place, and time.  Skin: Skin is warm and dry.  Psychiatric: He has a normal mood and affect. His behavior is normal. Judgment and thought content normal.    Current Medications:   Current Outpatient Prescriptions  Medication Sig Dispense Refill  . albuterol (PROVENTIL HFA;VENTOLIN HFA) 108 (90 BASE) MCG/ACT inhaler Inhale 1-2  puffs into the lungs every 6 (six) hours as needed for wheezing or shortness of breath. 3 Inhaler 2  . albuterol (PROVENTIL) (2.5 MG/3ML) 0.083% nebulizer solution Take 3 mLs (2.5 mg total) by nebulization every 4 (four) hours as needed for wheezing. 75 mL 12  . aspirin EC 81 MG tablet Take 1 tablet  (81 mg total) by mouth daily. 90 tablet 3  . glimepiride (AMARYL) 2 MG tablet Take 1 tablet (2 mg total) by mouth daily with breakfast. 90 tablet 1  . metFORMIN (GLUCOPHAGE) 500 MG tablet TAKE 1 TABLET (500 MG TOTAL) BY MOUTH 2 (TWO) TIMES DAILY WITH A MEAL. 180 tablet 1  . ondansetron (ZOFRAN ODT) 4 MG disintegrating tablet Take 1 tablet (4 mg total) by mouth every 8 (eight) hours as needed for nausea or vomiting. 20 tablet 0  . pantoprazole (PROTONIX) 40 MG tablet Take 1 tablet (40 mg total) by mouth daily. 30 tablet 0  . predniSONE (DELTASONE) 20 MG tablet Take 3 tablets once daily for 3 days, then take 2 tablets once daily for 3 days, then take one tablet once daily for 3 days, then STOP. 18 tablet 0  . simvastatin (ZOCOR) 40 MG tablet Take 1 tablet by mouth  every evening 90 tablet 3  . vitamin C (ASCORBIC ACID) 500 MG tablet Take 1 tablet (500 mg total) by mouth daily. 90 tablet 3  . budesonide-formoterol (SYMBICORT) 80-4.5 MCG/ACT inhaler Inhale 2 puffs into the lungs daily. (Patient not taking: Reported on 11/21/2015) 1 Inhaler 12   No current facility-administered medications for this visit.    Functional Status:   In your present state of health, do you have any difficulty performing the following activities: 11/16/2015 11/05/2015  Hearing? N N  Vision? N N  Difficulty concentrating or making decisions? N N  Walking or climbing stairs? Y Y  Dressing or bathing? N N  Doing errands, shopping? N N    Fall/Depression Screening:    PHQ 2/9 Scores 11/21/2015 09/25/2015 09/24/2015 09/24/2015 07/10/2013  PHQ - 2 Score 0 - 2 0 4  PHQ- 9 Score - - 9 - 7  Exception Documentation - Medical reason - - -   Filed Vitals:   11/21/15 1431  BP: 118/60  Pulse: 81  Resp: 20     Assessment:  [facility] enrollment and introduction to the [facility] program Case management related to COPD Non-adherence related to medication (Symbicort) Use of respiratory device (incentive spirometry)  Plan:  Will introduce  [facility] services, confirm consent and enter into both transition of care and COPD program at this time.  Physical assessment completed with no acute symptom noted today. Will began teachings on COPD action plan and provide education information related to pt's medical condition via EMMI program (COPD: What you can do, COPD: When to get help and COPD). Will stress the importance of daily monitoring as a prevention measure of early signs or symptoms and what to do if acute symptoms are encountered. COPD action plan discuss along with a plan of care and reachable goals as pt has agree with the changes to improve his overall health related issues. Will review how pt is also managing his diabetes while on Prednisone and improvement need regarding his dietary habits to improve his glucose levels ( pt with understanding). Verified pt is aware if he experiences additional elevation of his CBGs to contact his providers ASAP for intervention.  Will assist pt with obtaining his Symbicort to prevent possible readmission due to distressful breathing.  RN will contact Deanne Coffer, PhD [employer] for assistance in obtaining this medication to prevent possible readmission. Dawn Pettus has confirmed to assist pt and will meet the pt at his local pharmacy after hours due to the length of time for the medications to be available to the pt. Note pt very grateful and RN has confirmed pt will be able to afford this ongoing medication due to no available generics availability for this medications.   Will teach pt the correct use of his incentive spirometry and other breathing techniques to increase his energy with pursed lip breathing. Pt encouraged to use the device properly at least 3 times daily to increase his oxygenation and purse-lip breathing technique.  Plan of care discussed  In detail along with reachable goals and pt has agreed to improve his overall health for improving his management of care.   Raina Mina, RN Care  Management Coordinator [place] Office [phone]

## 2015-11-24 ENCOUNTER — Other Ambulatory Visit: Payer: Self-pay

## 2015-11-24 NOTE — Patient Outreach (Signed)
This is a late entry for 11/21/15.   [name] is a 75 year old male who was referred to me by Raina Mina, RN for medication assistance.  He was recently hospitalized for COPD exacerbation.  He informed Lattie Haw he was unable to afford the cost of Spiriva.  Due to his high risk of rehospitalization, he meets the qualifications for use of the Pharmacy Emergency Fund.  I met him at the [facility] at [place] and purchase the Spiriva.  I reviewed how to use it with him and asked it he had any questions.  He denied any questions at that time. He stated he will be able to afford the Spiriva after this fill.  If additional pharmacy issues arise with Mr. Calandra, I am happy to assist.  Otherwise, I will close him to pharmacy.   Deanne Coffer, PharmD, [place] [phone]

## 2015-11-25 ENCOUNTER — Other Ambulatory Visit (INDEPENDENT_AMBULATORY_CARE_PROVIDER_SITE_OTHER): Payer: Commercial Managed Care - HMO

## 2015-11-25 ENCOUNTER — Encounter: Payer: Self-pay | Admitting: Internal Medicine

## 2015-11-25 ENCOUNTER — Ambulatory Visit (INDEPENDENT_AMBULATORY_CARE_PROVIDER_SITE_OTHER): Payer: Commercial Managed Care - HMO | Admitting: Internal Medicine

## 2015-11-25 VITALS — BP 138/82 | HR 76 | Temp 97.6°F | Resp 20 | Ht >= 80 in | Wt 299.8 lb

## 2015-11-25 DIAGNOSIS — J449 Chronic obstructive pulmonary disease, unspecified: Secondary | ICD-10-CM | POA: Diagnosis not present

## 2015-11-25 DIAGNOSIS — I70209 Unspecified atherosclerosis of native arteries of extremities, unspecified extremity: Secondary | ICD-10-CM

## 2015-11-25 DIAGNOSIS — E1151 Type 2 diabetes mellitus with diabetic peripheral angiopathy without gangrene: Secondary | ICD-10-CM

## 2015-11-25 DIAGNOSIS — L602 Onychogryphosis: Secondary | ICD-10-CM | POA: Insufficient documentation

## 2015-11-25 DIAGNOSIS — J441 Chronic obstructive pulmonary disease with (acute) exacerbation: Secondary | ICD-10-CM | POA: Diagnosis not present

## 2015-11-25 DIAGNOSIS — E1159 Type 2 diabetes mellitus with other circulatory complications: Secondary | ICD-10-CM

## 2015-11-25 DIAGNOSIS — L609 Nail disorder, unspecified: Secondary | ICD-10-CM

## 2015-11-25 LAB — CBC WITH DIFFERENTIAL/PLATELET
BASOS PCT: 1.2 % (ref 0.0–3.0)
Basophils Absolute: 0.1 10*3/uL (ref 0.0–0.1)
EOS ABS: 0 10*3/uL (ref 0.0–0.7)
Eosinophils Relative: 0.2 % (ref 0.0–5.0)
HEMATOCRIT: 42.2 % (ref 39.0–52.0)
Hemoglobin: 14 g/dL (ref 13.0–17.0)
LYMPHS ABS: 0.8 10*3/uL (ref 0.7–4.0)
LYMPHS PCT: 11.2 % — AB (ref 12.0–46.0)
MCHC: 33.1 g/dL (ref 30.0–36.0)
MCV: 94.3 fl (ref 78.0–100.0)
Monocytes Absolute: 0.5 10*3/uL (ref 0.1–1.0)
Monocytes Relative: 6.4 % (ref 3.0–12.0)
NEUTROS ABS: 5.8 10*3/uL (ref 1.4–7.7)
NEUTROS PCT: 81 % — AB (ref 43.0–77.0)
PLATELETS: 178 10*3/uL (ref 150.0–400.0)
RBC: 4.48 Mil/uL (ref 4.22–5.81)
RDW: 12.3 % (ref 11.5–15.5)
WBC: 7.1 10*3/uL (ref 4.0–10.5)

## 2015-11-25 LAB — BASIC METABOLIC PANEL
BUN: 17 mg/dL (ref 6–23)
CO2: 30 mEq/L (ref 19–32)
Calcium: 9 mg/dL (ref 8.4–10.5)
Chloride: 104 mEq/L (ref 96–112)
Creatinine, Ser: 0.99 mg/dL (ref 0.40–1.50)
GFR: 95.11 mL/min (ref >60.00)
Glucose, Bld: 280 mg/dL — ABNORMAL HIGH (ref 70–99)
POTASSIUM: 4.4 meq/L (ref 3.5–5.1)
SODIUM: 140 meq/L (ref 135–145)

## 2015-11-25 LAB — HEMOGLOBIN A1C: Hgb A1c MFr Bld: 8.5 % — ABNORMAL HIGH (ref 4.6–6.5)

## 2015-11-25 NOTE — Patient Instructions (Signed)

## 2015-11-25 NOTE — Progress Notes (Signed)
Subjective:  Patient ID: Jason [name], male    DOB: February 22, 1941  Age: 75 y.o. MRN: [id number]  CC: COPD and Diabetes   Admit date: 11/16/2015 Discharge date: 11/19/2015  Recommendations for Outpatient Follow-up:  1. Please follow up with PCP in 1 week to make sure symptoms are controlled  Discharge Diagnoses:  Principal Problem:  Acute exacerbation of chronic obstructive pulmonary disease (COPD) ([place]) Active Problems:  Diabetes mellitus type 2 with atherosclerosis of arteries of extremities (HCC)  COPD exacerbation ([place])   HPI Jason [name] presents for follow-up after recent admission for exacerbation of COPD. He has a rare cough that is nonproductive. Otherwise he feels well and offers no complaints. He is concerned that the steroids that he's been taking for the last week have raised his blood sugars.  Outpatient Prescriptions Prior to Visit  Medication Sig Dispense Refill  . albuterol (PROVENTIL HFA;VENTOLIN HFA) 108 (90 BASE) MCG/ACT inhaler Inhale 1-2 puffs into the lungs every 6 (six) hours as needed for wheezing or shortness of breath. 3 Inhaler 2  . albuterol (PROVENTIL) (2.5 MG/3ML) 0.083% nebulizer solution Take 3 mLs (2.5 mg total) by nebulization every 4 (four) hours as needed for wheezing. 75 mL 12  . aspirin EC 81 MG tablet Take 1 tablet (81 mg total) by mouth daily. 90 tablet 3  . budesonide-formoterol (SYMBICORT) 80-4.5 MCG/ACT inhaler Inhale 2 puffs into the lungs daily. 1 Inhaler 12  . glimepiride (AMARYL) 2 MG tablet Take 1 tablet (2 mg total) by mouth daily with breakfast. 90 tablet 1  . metFORMIN (GLUCOPHAGE) 500 MG tablet TAKE 1 TABLET (500 MG TOTAL) BY MOUTH 2 (TWO) TIMES DAILY WITH A MEAL. 180 tablet 1  . ondansetron (ZOFRAN ODT) 4 MG disintegrating tablet Take 1 tablet (4 mg total) by mouth every 8 (eight) hours as needed for nausea or vomiting. 20 tablet 0  . pantoprazole (PROTONIX) 40 MG tablet Take 1 tablet (40 mg total) by mouth daily. 30 tablet 0    . simvastatin (ZOCOR) 40 MG tablet Take 1 tablet by mouth  every evening 90 tablet 3  . vitamin C (ASCORBIC ACID) 500 MG tablet Take 1 tablet (500 mg total) by mouth daily. 90 tablet 3  . predniSONE (DELTASONE) 20 MG tablet Take 3 tablets once daily for 3 days, then take 2 tablets once daily for 3 days, then take one tablet once daily for 3 days, then STOP. 18 tablet 0   No facility-administered medications prior to visit.    ROS Review of Systems  Constitutional: Negative.  Negative for fever, chills, diaphoresis, appetite change and fatigue.  HENT: Negative.  Negative for congestion, facial swelling, sinus pressure, sore throat and trouble swallowing.   Eyes: Negative.   Respiratory: Positive for cough. Negative for apnea, choking, chest tightness, shortness of breath, wheezing and stridor.   Cardiovascular: Negative.  Negative for chest pain, palpitations and leg swelling.  Gastrointestinal: Negative.  Negative for nausea, vomiting, abdominal pain, diarrhea, constipation and blood in stool.  Endocrine: Negative.  Negative for polydipsia, polyphagia and polyuria.  Genitourinary: Negative.  Negative for difficulty urinating.  Musculoskeletal: Negative.  Negative for myalgias, back pain, joint swelling and arthralgias.  Skin: Negative.  Negative for color change and rash.  Allergic/Immunologic: Negative.   Neurological: Negative.  Negative for dizziness and light-headedness.  Hematological: Negative.  Negative for adenopathy. Does not bruise/bleed easily.  Psychiatric/Behavioral: Negative.     Objective:  BP 138/82 mmHg  Pulse 76  Temp(Src) 97.6 F (36.4  C) (Oral)  Resp 20  Ht 6\' 8"  (2.032 m)  Wt 299 lb 12 oz (135.966 kg)  BMI 32.93 kg/m2  SpO2 96%  BP Readings from Last 3 Encounters:  11/25/15 138/82  11/21/15 118/60  11/19/15 115/63    Wt Readings from Last 3 Encounters:  11/25/15 299 lb 12 oz (135.966 kg)  11/21/15 314 lb 6.4 oz (142.611 kg)  11/16/15 314 lb (142.429  kg)    Physical Exam  Constitutional: He is oriented to person, place, and time. No distress.  HENT:  Head: Normocephalic and atraumatic.  Mouth/Throat: Oropharynx is clear and moist. No oropharyngeal exudate.  Eyes: Conjunctivae are normal. Right eye exhibits no discharge. Left eye exhibits no discharge. No scleral icterus.  Neck: Normal range of motion. Neck supple. No JVD present. No tracheal deviation present. No thyromegaly present.  Cardiovascular: Normal rate, regular rhythm, normal heart sounds and intact distal pulses.  Exam reveals no gallop and no friction rub.   No murmur heard. Pulmonary/Chest: Effort normal and breath sounds normal. No stridor. No respiratory distress. He has no wheezes. He has no rales. He exhibits no tenderness.  Abdominal: Soft. Bowel sounds are normal. He exhibits no distension and no mass. There is no tenderness. There is no rebound and no guarding.  Musculoskeletal: Normal range of motion. He exhibits no edema or tenderness.  Lymphadenopathy:    He has no cervical adenopathy.  Neurological: He is oriented to person, place, and time.  Skin: Skin is warm and dry. No rash noted. He is not diaphoretic. No erythema. No pallor.  Psychiatric: He has a normal mood and affect. His behavior is normal. Judgment and thought content normal.  Vitals reviewed.   Lab Results  Component Value Date   WBC 7.1 11/25/2015   HGB 14.0 11/25/2015   HCT 42.2 11/25/2015   PLT 178.0 11/25/2015   GLUCOSE 280* 11/25/2015   CHOL 134 09/24/2015   TRIG 69.0 09/24/2015   HDL 41.70 09/24/2015   LDLCALC 78 09/24/2015   ALT 37 11/18/2015   AST 21 11/18/2015   NA 140 11/25/2015   K 4.4 11/25/2015   CL 104 11/25/2015   CREATININE 0.99 11/25/2015   BUN 17 11/25/2015   CO2 30 11/25/2015   TSH 1.14 09/24/2015   PSA 0.44 09/24/2015   INR 1.05 10/17/2012   HGBA1C 8.5* 11/25/2015   MICROALBUR 2.3* 09/24/2015    Dg Chest 2 View  11/16/2015  CLINICAL DATA:  Shortness of  breath, cough. EXAM: CHEST  2 VIEW COMPARISON:  November 06, 2015. FINDINGS: Stable left basilar scarring is noted. No acute pulmonary disease is noted. Bony thorax is unremarkable. No pneumothorax or pleural effusion is noted. IMPRESSION: Stable left basilar scarring. No acute cardiopulmonary abnormality seen. Electronically Signed   By: Marijo Conception, M.D.   On: 11/16/2015 08:21    Assessment & Plan:   Natthan was seen today for copd and diabetes.  Diagnoses and all orders for this visit:  Diabetes mellitus type 2 with atherosclerosis of arteries of extremities ([place])- his A1c is up to 8.5%, he is not willing to make any changes in his diabetic therapy at this time. He wants to stop the steroids and to recheck his A1c in the next few weeks. -     Basic metabolic panel; Future -     CBC with Differential/Platelet; Future -     Hemoglobin A1c; Future -     Ambulatory referral to Podiatry  Obstructive chronic bronchitis without exacerbation (  [place])- he is doing well on the current therapy, will continue. -     CBC with Differential/Platelet; Future  Overgrown toenails -     Ambulatory referral to Podiatry  I have discontinued Mr. Oakley predniSONE. I am also having him maintain his vitamin C, aspirin EC, glimepiride, simvastatin, metFORMIN, albuterol, ondansetron, budesonide-formoterol, albuterol, and pantoprazole.  No orders of the defined types were placed in this encounter.     Follow-up: Return in about 4 months (around 03/24/2016).  Scarlette Calico, MD

## 2015-11-28 ENCOUNTER — Other Ambulatory Visit: Payer: Self-pay | Admitting: *Deleted

## 2015-11-28 NOTE — Patient Outreach (Addendum)
[place] [facility]) Care Management  11/28/2015  ALEJANDO KOPPER 22-Jun-1941 [id number]  Transition of care (week 2)  RN attempted outreach call to pt today however unsuccessful. Will continue outreach attempts for ongoing transition of care contact. Note pt has enrolled and received medication assistance via [facility] as a preventive measure and enforce pt's adherence to his ongoing medication regimen.   Raina Mina, RN Care Management Coordinator [place] Office [phone]

## 2015-12-01 ENCOUNTER — Telehealth: Payer: Self-pay | Admitting: Internal Medicine

## 2015-12-01 NOTE — Telephone Encounter (Signed)
Pt was told his blood sugar would go back to normal after he was off the steroids and it has not. He's wondering what the next step would be. Best number to reach him is [phone]

## 2015-12-03 NOTE — Telephone Encounter (Signed)
Spoke with pt. Sugars are down will call back with any further problems

## 2015-12-05 ENCOUNTER — Other Ambulatory Visit: Payer: Self-pay | Admitting: *Deleted

## 2015-12-05 NOTE — Patient Outreach (Signed)
[place] [facility]) Care Management  12/05/2015  Jason [name] 01-05-1941 [id number]   Transition of care (week 3)  RN spoke with pt today concerning his ongoing management of care. Pt reports his recently visited his primary and will continue his Prednisone therapy. Pt reports he is "doing well" with no encountered issues. RN reiterated on the COPD action plan and verified pt remains in the GREEN zone with no encountered issues. Continues to states he and his wife are caring for his uncle however has spoken with a Education officer, museum who will be assisting with placing his uncle into a nursing home soon. Pt remains stress due to the passing of his mother and increase care for his uncle however continues to manager his care. Reports his is attending all appointments and taking all the recommended medications with no delays or issues. Pt receptive to a follow up call next week for ongoing transition of care. Note scheduled home visit for next month scheduled.  Raina Mina, RN Care Management Coordinator [place] Office [phone]

## 2015-12-11 ENCOUNTER — Other Ambulatory Visit: Payer: Self-pay | Admitting: *Deleted

## 2015-12-11 NOTE — Patient Outreach (Signed)
[place] [facility]) Care Management  12/11/2015  TYONE HUEY [birth year] [id number]  Transition of care (week 4)  RN attempted outreach call today however unsuccessful. RN able to leave a HIPAA approved voice message requesting a call back for services.    Raina Mina, RN Care Management Coordinator [place] Office [phone]

## 2015-12-11 NOTE — Patient Outreach (Signed)
[place] [facility]) Care Management  12/11/2015  Jason [name] 1941-04-25 [id number]  Transition of care (week 4)  RN spoke with pt today who indicates he is doing well however issues with "sleeping". Pt feels he has sleep apnea and will request a consult for a sleep study. States this was presented to him on his last hospitalization and he wishes to pursue it further. Pt states he doing week in managing his COPD with no reported issues. Verified he is using his inhalers, and nebulizer as recommended (GREEN zone COPD action plan) with no problems.  Pt able to recite some of his upcoming appointments as RN revealed the appointments noted in EPIC for pt's adherence in attending all appointments. RN also encouraged pt to request a sleep study sooner if needed duetot his lack of sleep. Rn reminded pt of the scheduled upcoming appointment with this RN for ongoing [facility] services.   Raina Mina, RN Care Management Coordinator [place] Office [phone]

## 2015-12-15 ENCOUNTER — Ambulatory Visit: Payer: Commercial Managed Care - HMO | Admitting: Internal Medicine

## 2015-12-15 ENCOUNTER — Other Ambulatory Visit: Payer: Self-pay | Admitting: Internal Medicine

## 2015-12-17 ENCOUNTER — Other Ambulatory Visit: Payer: Self-pay | Admitting: Internal Medicine

## 2015-12-18 ENCOUNTER — Ambulatory Visit (INDEPENDENT_AMBULATORY_CARE_PROVIDER_SITE_OTHER): Payer: Commercial Managed Care - HMO | Admitting: Internal Medicine

## 2015-12-18 ENCOUNTER — Encounter: Payer: Self-pay | Admitting: Internal Medicine

## 2015-12-18 VITALS — BP 98/50 | HR 85 | Temp 98.5°F | Resp 16 | Ht >= 80 in | Wt 307.0 lb

## 2015-12-18 DIAGNOSIS — G4733 Obstructive sleep apnea (adult) (pediatric): Secondary | ICD-10-CM | POA: Diagnosis not present

## 2015-12-18 NOTE — Patient Instructions (Signed)
Sleep Apnea  Sleep apnea is a sleep disorder characterized by abnormal pauses in breathing while you sleep. When your breathing pauses, the level of oxygen in your blood decreases. This causes you to move out of deep sleep and into light sleep. As a result, your quality of sleep is poor, and the system that carries your blood throughout your body (cardiovascular system) experiences stress. If sleep apnea remains untreated, the following conditions can develop:  High blood pressure (hypertension).  Coronary artery disease.  Inability to achieve or maintain an erection (impotence).  Impairment of your thought process (cognitive dysfunction). There are three types of sleep apnea: 1. Obstructive sleep apnea--Pauses in breathing during sleep because of a blocked airway. 2. Central sleep apnea--Pauses in breathing during sleep because the area of the brain that controls your breathing does not send the correct signals to the muscles that control breathing. 3. Mixed sleep apnea--A combination of both obstructive and central sleep apnea. RISK FACTORS The following risk factors can increase your risk of developing sleep apnea:  Being overweight.  Smoking.  Having narrow passages in your nose and throat.  Being of older age.  Being male.  Alcohol use.  Sedative and tranquilizer use.  Ethnicity. Among individuals younger than 35 years, African Americans are at increased risk of sleep apnea. SYMPTOMS   Difficulty staying asleep.  Daytime sleepiness and fatigue.  Loss of energy.  Irritability.  Loud, heavy snoring.  Morning headaches.  Trouble concentrating.  Forgetfulness.  Decreased interest in sex.  Unexplained sleepiness. DIAGNOSIS  In order to diagnose sleep apnea, your caregiver will perform a physical examination. A sleep study done in the comfort of your own home may be appropriate if you are otherwise healthy. Your caregiver may also recommend that you spend the  night in a sleep lab. In the sleep lab, several monitors record information about your heart, lungs, and brain while you sleep. Your leg and arm movements and blood oxygen level are also recorded. TREATMENT The following actions may help to resolve mild sleep apnea:  Sleeping on your side.   Using a decongestant if you have nasal congestion.   Avoiding the use of depressants, including alcohol, sedatives, and narcotics.   Losing weight and modifying your diet if you are overweight. There also are devices and treatments to help open your airway:  Oral appliances. These are custom-made mouthpieces that shift your lower jaw forward and slightly open your bite. This opens your airway.  Devices that create positive airway pressure. This positive pressure "splints" your airway open to help you breathe better during sleep. The following devices create positive airway pressure:  Continuous positive airway pressure (CPAP) device. The CPAP device creates a continuous level of air pressure with an air pump. The air is delivered to your airway through a mask while you sleep. This continuous pressure keeps your airway open.  Nasal expiratory positive airway pressure (EPAP) device. The EPAP device creates positive air pressure as you exhale. The device consists of single-use valves, which are inserted into each nostril and held in place by adhesive. The valves create very little resistance when you inhale but create much more resistance when you exhale. That increased resistance creates the positive airway pressure. This positive pressure while you exhale keeps your airway open, making it easier to breath when you inhale again.  Bilevel positive airway pressure (BPAP) device. The BPAP device is used mainly in patients with central sleep apnea. This device is similar to the CPAP device because   it also uses an air pump to deliver continuous air pressure through a mask. However, with the BPAP machine, the  pressure is set at two different levels. The pressure when you exhale is lower than the pressure when you inhale.  Surgery. Typically, surgery is only done if you cannot comply with less invasive treatments or if the less invasive treatments do not improve your condition. Surgery involves removing excess tissue in your airway to create a wider passage way.   This information is not intended to replace advice given to you by your health care provider. Make sure you discuss any questions you have with your health care provider.   Document Released: 10/22/2002 Document Revised: 11/22/2014 Document Reviewed: 03/09/2012 Elsevier Interactive Patient Education 2016 Elsevier Inc.   

## 2015-12-18 NOTE — Progress Notes (Signed)
Pre visit review using our clinic review tool, if applicable. No additional management support is needed unless otherwise documented below in the visit note. 

## 2015-12-19 ENCOUNTER — Emergency Department (HOSPITAL_COMMUNITY)
Admission: EM | Admit: 2015-12-19 | Discharge: 2015-12-19 | Disposition: A | Discharge status: Home / Self Care | Payer: Commercial Managed Care - HMO | Attending: Emergency Medicine | Admitting: Emergency Medicine

## 2015-12-19 ENCOUNTER — Encounter (HOSPITAL_COMMUNITY): Payer: Self-pay

## 2015-12-19 DIAGNOSIS — Z8701 Personal history of pneumonia (recurrent): Secondary | ICD-10-CM | POA: Insufficient documentation

## 2015-12-19 DIAGNOSIS — E669 Obesity, unspecified: Secondary | ICD-10-CM | POA: Diagnosis not present

## 2015-12-19 DIAGNOSIS — Z7984 Long term (current) use of oral hypoglycemic drugs: Secondary | ICD-10-CM | POA: Diagnosis not present

## 2015-12-19 DIAGNOSIS — E1165 Type 2 diabetes mellitus with hyperglycemia: Secondary | ICD-10-CM | POA: Diagnosis not present

## 2015-12-19 DIAGNOSIS — G4730 Sleep apnea, unspecified: Secondary | ICD-10-CM | POA: Diagnosis not present

## 2015-12-19 DIAGNOSIS — M069 Rheumatoid arthritis, unspecified: Secondary | ICD-10-CM | POA: Insufficient documentation

## 2015-12-19 DIAGNOSIS — E785 Hyperlipidemia, unspecified: Secondary | ICD-10-CM | POA: Diagnosis not present

## 2015-12-19 DIAGNOSIS — Z8679 Personal history of other diseases of the circulatory system: Secondary | ICD-10-CM | POA: Insufficient documentation

## 2015-12-19 DIAGNOSIS — J449 Chronic obstructive pulmonary disease, unspecified: Secondary | ICD-10-CM | POA: Diagnosis not present

## 2015-12-19 DIAGNOSIS — Z7951 Long term (current) use of inhaled steroids: Secondary | ICD-10-CM | POA: Diagnosis not present

## 2015-12-19 DIAGNOSIS — Z79899 Other long term (current) drug therapy: Secondary | ICD-10-CM | POA: Diagnosis not present

## 2015-12-19 DIAGNOSIS — Z87891 Personal history of nicotine dependence: Secondary | ICD-10-CM | POA: Diagnosis not present

## 2015-12-19 DIAGNOSIS — Z872 Personal history of diseases of the skin and subcutaneous tissue: Secondary | ICD-10-CM | POA: Insufficient documentation

## 2015-12-19 DIAGNOSIS — D649 Anemia, unspecified: Secondary | ICD-10-CM | POA: Insufficient documentation

## 2015-12-19 DIAGNOSIS — Z7982 Long term (current) use of aspirin: Secondary | ICD-10-CM | POA: Diagnosis not present

## 2015-12-19 DIAGNOSIS — Z9981 Dependence on supplemental oxygen: Secondary | ICD-10-CM | POA: Insufficient documentation

## 2015-12-19 DIAGNOSIS — R011 Cardiac murmur, unspecified: Secondary | ICD-10-CM | POA: Insufficient documentation

## 2015-12-19 DIAGNOSIS — Z8719 Personal history of other diseases of the digestive system: Secondary | ICD-10-CM | POA: Insufficient documentation

## 2015-12-19 DIAGNOSIS — Z87438 Personal history of other diseases of male genital organs: Secondary | ICD-10-CM | POA: Insufficient documentation

## 2015-12-19 DIAGNOSIS — Z8659 Personal history of other mental and behavioral disorders: Secondary | ICD-10-CM | POA: Diagnosis not present

## 2015-12-19 DIAGNOSIS — Z86018 Personal history of other benign neoplasm: Secondary | ICD-10-CM | POA: Diagnosis not present

## 2015-12-19 LAB — BASIC METABOLIC PANEL
Anion gap: 12 (ref 5–15)
BUN: 11 mg/dL (ref 6–20)
CALCIUM: 9.6 mg/dL (ref 8.9–10.3)
CO2: 24 mmol/L (ref 22–32)
CREATININE: 0.72 mg/dL (ref 0.61–1.24)
Chloride: 106 mmol/L (ref 101–111)
GFR calc Af Amer: >60 mL/min (ref >60)
GFR calc non Af Amer: >60 mL/min (ref >60)
GLUCOSE: 147 mg/dL — AB (ref 65–99)
POTASSIUM: 3.9 mmol/L (ref 3.5–5.1)
Sodium: 142 mmol/L (ref 135–145)

## 2015-12-19 LAB — CBC
HCT: 36.2 % — ABNORMAL LOW (ref 39.0–52.0)
Hemoglobin: 11.9 g/dL — ABNORMAL LOW (ref 13.0–17.0)
MCH: 31.2 pg (ref 26.0–34.0)
MCHC: 32.9 g/dL (ref 30.0–36.0)
MCV: 94.8 fL (ref 78.0–100.0)
PLATELETS: 189 10*3/uL (ref 150–400)
RBC: 3.82 MIL/uL — ABNORMAL LOW (ref 4.22–5.81)
RDW: 12.7 % (ref 11.5–15.5)
WBC: 5.2 10*3/uL (ref 4.0–10.5)

## 2015-12-19 LAB — URINALYSIS, ROUTINE W REFLEX MICROSCOPIC
BILIRUBIN URINE: NEGATIVE
Glucose, UA: NEGATIVE mg/dL
HGB URINE DIPSTICK: NEGATIVE
Ketones, ur: NEGATIVE mg/dL
Leukocytes, UA: NEGATIVE
Nitrite: NEGATIVE
PROTEIN: NEGATIVE mg/dL
Specific Gravity, Urine: 1.025 (ref 1.005–1.030)
pH: 6.5 (ref 5.0–8.0)

## 2015-12-19 LAB — CBG MONITORING, ED: GLUCOSE-CAPILLARY: 143 mg/dL — AB (ref 65–99)

## 2015-12-19 NOTE — ED Notes (Signed)
Pt was recently on prednisone for pneumonia.  CBG had elevated at that time.  Pt stopped prednisone in last week.  Pt cbg last night was in 130's.  Today over 500.  No symptoms.  Pt is type II diabetic and on metformin.  No missed dosing.

## 2015-12-19 NOTE — Discharge Instructions (Signed)
Anemia, Nonspecific Anemia is a condition in which the concentration of red blood cells or hemoglobin in the blood is below normal. Hemoglobin is a substance in red blood cells that carries oxygen to the tissues of the body. Anemia results in not enough oxygen reaching these tissues.  CAUSES  Common causes of anemia include:   Excessive bleeding. Bleeding may be internal or external. This includes excessive bleeding from periods (in women) or from the intestine.   Poor nutrition.   Chronic kidney, thyroid, and liver disease.  Bone marrow disorders that decrease red blood cell production.  Cancer and treatments for cancer.  HIV, AIDS, and their treatments.  Spleen problems that increase red blood cell destruction.  Blood disorders.  Excess destruction of red blood cells due to infection, medicines, and autoimmune disorders. SIGNS AND SYMPTOMS   Minor weakness.   Dizziness.   Headache.  Palpitations.   Shortness of breath, especially with exercise.   Paleness.  Cold sensitivity.  Indigestion.  Nausea.  Difficulty sleeping.  Difficulty concentrating. Symptoms may occur suddenly or they may develop slowly.  DIAGNOSIS  Additional blood tests are often needed. These help your health care provider determine the best treatment. Your health care provider will check your stool for blood and look for other causes of blood loss.  TREATMENT  Treatment varies depending on the cause of the anemia. Treatment can include:   Supplements of iron, vitamin 123456, or folic acid.   Hormone medicines.   A blood transfusion. This may be needed if blood loss is severe.   Hospitalization. This may be needed if there is significant continual blood loss.   Dietary changes.  Spleen removal. HOME CARE INSTRUCTIONS Keep all follow-up appointments. It often takes many weeks to correct anemia, and having your health care provider check on your condition and your response to  treatment is very important. SEEK IMMEDIATE MEDICAL CARE IF:   You develop extreme weakness, shortness of breath, or chest pain.   You become dizzy or have trouble concentrating.  You develop heavy vaginal bleeding.   You develop a rash.   You have bloody or black, tarry stools.   You faint.   You vomit up blood.   You vomit repeatedly.   You have abdominal pain.  You have a fever or persistent symptoms for more than 2-3 days.   You have a fever and your symptoms suddenly get worse.   You are dehydrated.  MAKE SURE YOU:  Understand these instructions.  Will watch your condition.  Will get help right away if you are not doing well or get worse.   This information is not intended to replace advice given to you by your health care provider. Make sure you discuss any questions you have with your health care provider.   Document Released: 12/09/2004 Document Revised: 07/04/2013 Document Reviewed: 04/27/2013 Elsevier Interactive Patient Education 2016 [employer].  Diabetes Mellitus and Food It is important for you to manage your blood sugar (glucose) level. Your blood glucose level can be greatly affected by what you eat. Eating healthier foods in the appropriate amounts throughout the day at about the same time each day will help you control your blood glucose level. It can also help slow or prevent worsening of your diabetes mellitus. Healthy eating may even help you improve the level of your blood pressure and reach or maintain a healthy weight.  General recommendations for healthful eating and cooking habits include:  Eating meals and snacks regularly. Avoid going  long periods of time without eating to lose weight.  Eating a diet that consists mainly of plant-based foods, such as fruits, vegetables, nuts, legumes, and whole grains.  Using low-heat cooking methods, such as baking, instead of high-heat cooking methods, such as deep frying. Work with your  dietitian to make sure you understand how to use the Nutrition Facts information on food labels. HOW CAN FOOD AFFECT ME? Carbohydrates Carbohydrates affect your blood glucose level more than any other type of food. Your dietitian will help you determine how many carbohydrates to eat at each meal and teach you how to count carbohydrates. Counting carbohydrates is important to keep your blood glucose at a healthy level, especially if you are using insulin or taking certain medicines for diabetes mellitus. Alcohol Alcohol can cause sudden decreases in blood glucose (hypoglycemia), especially if you use insulin or take certain medicines for diabetes mellitus. Hypoglycemia can be a life-threatening condition. Symptoms of hypoglycemia (sleepiness, dizziness, and disorientation) are similar to symptoms of having too much alcohol.  If your health care provider has given you approval to drink alcohol, do so in moderation and use the following guidelines:  Women should not have more than one drink per day, and men should not have more than two drinks per day. One drink is equal to:  12 oz of beer.  5 oz of wine.  1 oz of hard liquor.  Do not drink on an empty stomach.  Keep yourself hydrated. Have water, diet soda, or unsweetened iced tea.  Regular soda, juice, and other mixers might contain a lot of carbohydrates and should be counted. WHAT FOODS ARE NOT RECOMMENDED? As you make food choices, it is important to remember that all foods are not the same. Some foods have fewer nutrients per serving than other foods, even though they might have the same number of calories or carbohydrates. It is difficult to get your body what it needs when you eat foods with fewer nutrients. Examples of foods that you should avoid that are high in calories and carbohydrates but low in nutrients include:  Trans fats (most processed foods list trans fats on the Nutrition Facts label).  Regular  soda.  Juice.  Candy.  Sweets, such as cake, pie, doughnuts, and cookies.  Fried foods. WHAT FOODS CAN I EAT? Eat nutrient-rich foods, which will nourish your body and keep you healthy. The food you should eat also will depend on several factors, including:  The calories you need.  The medicines you take.  Your weight.  Your blood glucose level.  Your blood pressure level.  Your cholesterol level. You should eat a variety of foods, including:  Protein.  Lean cuts of meat.  Proteins low in saturated fats, such as fish, egg whites, and beans. Avoid processed meats.  Fruits and vegetables.  Fruits and vegetables that may help control blood glucose levels, such as apples, mangoes, and yams.  Dairy products.  Choose fat-free or low-fat dairy products, such as milk, yogurt, and cheese.  Grains, bread, pasta, and rice.  Choose whole grain products, such as multigrain bread, whole oats, and brown rice. These foods may help control blood pressure.  Fats.  Foods containing healthful fats, such as nuts, avocado, olive oil, canola oil, and fish. DOES EVERYONE WITH DIABETES MELLITUS HAVE THE SAME MEAL PLAN? Because every person with diabetes mellitus is different, there is not one meal plan that works for everyone. It is very important that you meet with a dietitian who will help  you create a meal plan that is just right for you.   This information is not intended to replace advice given to you by your health care provider. Make sure you discuss any questions you have with your health care provider.   Document Released: 07/29/2005 Document Revised: 11/22/2014 Document Reviewed: 09/28/2013 Elsevier Interactive Patient Education [employer].

## 2015-12-19 NOTE — ED Provider Notes (Signed)
Medical screening examination/treatment/procedure(s) were conducted as a shared visit with non-physician practitioner(s) and myself.  I personally evaluated the patient during the encounter.   EKG Interpretation None      75 year old male with history of type 2 diabetes on oral anti-glycemic medications who presents with concern for hyperglycemia. This morning took routine CBG, which was 580. Took it again and it was 410. States that he has otherwise felt as if he is in his usual state of health. Recently finished prednisone for pneumonia one week ago, and during this time did have a large fluctuations of his blood sugar. His had cough associated with his COPD, but denies any fevers, chills, chest pain, difficulty breathing, nausea or vomiting, abdominal pain, diarrhea, or new urinary complaints. Decided to come to ED for evaluation given hyperglycemia. On arrival is well-appearing and in no acute distress. Stable vital signs. His point of care glucose here is 140, do not believe that he requires any acute intervention. We'll check basic blood work, and if unremarkable be cleared for discharge home. I may be in the setting of his recent steroid use with episodes of hyperglycemia, but this appears to be appropriately responding to his oral anti-glycemic medications.  Will continue to follow-up with PCP regarding medication changes as needed.  Forde Dandy, MD 12/19/15 [phone]

## 2015-12-19 NOTE — ED Provider Notes (Signed)
CSN: [id number]     Arrival date & time 12/19/15  1021 History   First MD Initiated Contact with Patient 12/19/15 1026     Chief Complaint  Patient presents with  . Hyperglycemia     (Consider location/radiation/quality/duration/timing/severity/associated sxs/prior Treatment) HPI   Jason [name] is a 75 year old male with history of type 2 diabetes, venous insufficiency, COPD, who presents to the emergency room with concerns of hyperglycemia.  His fasting sugar this morning was 502, and he was already bringing in his father for evaluation after a fall this morning, so he decided to check in and be evaluated.  Upon arrival to the ER the patient's blood sugar is 143 after taking his morning dose of glymepiride.  Patient states that he does not normally have high sugars.  He states that he has had increased stress recently, taking care of his father. He also notes recent steroids.  Chart was reviewed with the patient and found that a steroid taper was finished on January 13 after an admission for COPD.  He admits to eating poorly lately with late night treats such as chocolate shakes.  He states that his father has been more difficult to take her off, getting up early mornings and late night and is causing him to eat more irradically than usual.  He denies any recent fever, infection or pain. He saw his primary care provider yesterday, for routine follow-up care. He does have a complete appointment with his pulmonologist and with [facility].  He has urinary urgency which is unchanged from its baseline. He denies increased urinary frequency, nocturia, polydipsia, dehydration, lightheadedness, weight loss, weakness.  He states that since he was discharged from the hospital he has gained weight, and has otherwise been feeling very well.  He states he may have been developing a slight cold. He denies productive cough, wheeze, shortness of breath.  Past Medical History  Diagnosis Date  . Unspecified  sinusitis (chronic)   . Mitral valve disorders   . Unspecified venous (peripheral) insufficiency   . Type II or unspecified type diabetes mellitus without mention of complication, not stated as uncontrolled   . Obesity, unspecified   . Esophageal reflux   . Diverticulosis of colon (without mention of hemorrhage)   . Irritable bowel syndrome   . Benign neoplasm of colon   . History of BPH   . History of gynecomastia     Unilateral  . DJD (degenerative joint disease)   . Hidradenitis   . MVP (mitral valve prolapse)   . Pneumonia, organism unspecified 2009  . Depression   . Sleep apnea     does not use  cpap or bipap .. no study  ever  . Heart murmur 20 yrs at least.     mitral valve prolapse.   . Allergy   . Asthma     in past, no inhaler now  . Glaucoma   . Hyperlipidemia   . COPD (chronic obstructive pulmonary disease) ([place])   . Anxiety   . Memory loss   . Depression   . Rheumatoid arthritis ([place])   . Poor circulation    Past Surgical History  Procedure Laterality Date  . Knee arthroscopy    . Toe amputation  06/2009    x3Right foot second toe -Dr Beola Cord  . Laser ablation  06/2009    Left Greater Saphenous vein -Dr Donnetta Hutching  . Circumcision  06/2100    For Phimosis - Dr Hartley Barefoot  . Laparotomy  07/20/11  .  Colon surgery    . Tonsillectomy    . Amputation  08/09/2012    Procedure: AMPUTATION DIGIT;  Surgeon: Newt Minion, MD;  Location: [facility];  Service: Orthopedics;  Laterality: Right;  Amputation right Great Toe MTP Joint  . Amputation  10/17/2012    Procedure: AMPUTATION DIGIT;  Surgeon: Newt Minion, MD;  Location: [facility];  Service: Orthopedics;  Laterality: Right;  Right Foot 3rd Toe Amputation at Metatarsophalangeal Joint   Family History  Problem Relation Age of Onset  . Diabetes Father   . Cancer Father     Prostate  . Stroke Father   . Prostate cancer Father   . Colon cancer Neg Hx   . Esophageal cancer Neg Hx   . Rectal cancer Neg Hx   . Stomach cancer Neg  Hx    Social History  Substance Use Topics  . Smoking status: Former Smoker -- 0.50 packs/day for 40 years    Types: Cigarettes    Quit date: 09/05/2015  . Smokeless tobacco: Never Used  . Alcohol Use: 4.2 oz/week    7 Cans of beer per week     Comment: not every week     Review of Systems  All other systems reviewed and are negative.     Allergies  Review of patient's allergies indicates no known allergies.  Home Medications   Prior to Admission medications   Medication Sig Start Date End Date Taking? Authorizing Provider  albuterol (PROVENTIL HFA;VENTOLIN HFA) 108 (90 BASE) MCG/ACT inhaler Inhale 1-2 puffs into the lungs every 6 (six) hours as needed for wheezing or shortness of breath. 10/08/15  Yes Noralee Space, MD  albuterol (PROVENTIL) (2.5 MG/3ML) 0.083% nebulizer solution Take 3 mLs (2.5 mg total) by nebulization every 4 (four) hours as needed for wheezing. 11/18/15  Yes Bonnielee Haff, MD  aspirin EC 81 MG tablet Take 1 tablet (81 mg total) by mouth daily. 11/14/14  Yes [name], MD  budesonide-formoterol (SYMBICORT) 80-4.5 MCG/ACT inhaler Inhale 2 puffs into the lungs daily. 11/18/15  Yes Bonnielee Haff, MD  glimepiride (AMARYL) 2 MG tablet TAKE 1 TABLET EVERY DAY WITH BREAKFAST 12/15/15  Yes [name], MD  metFORMIN (GLUCOPHAGE) 500 MG tablet TAKE 1 TABLET TWICE DAILY WITH MEALS 12/17/15  Yes [name], MD  pantoprazole (PROTONIX) 40 MG tablet Take 1 tablet (40 mg total) by mouth daily. Patient not taking: Reported on 12/23/2015 11/19/15  Yes Robbie Lis, MD  simvastatin (ZOCOR) 40 MG tablet TAKE 1 TABLET EVERY EVENING 12/15/15  Yes [name], MD  vitamin C (ASCORBIC ACID) 500 MG tablet Take 1 tablet (500 mg total) by mouth daily. 11/14/14  Yes [name], MD   BP 130/66 mmHg  Pulse 75  Temp(Src) 97.6 F (36.4 C) (Oral)  Resp 16  SpO2 95% Physical Exam  Constitutional: He is oriented to person, place, and time. Vital signs are normal. He appears  well-developed and well-nourished. He is cooperative.  Non-toxic appearance. No distress.  HENT:  Head: Normocephalic and atraumatic. Head is without right periorbital erythema and without left periorbital erythema.  Right Ear: Hearing, tympanic membrane, external ear and ear canal normal. No drainage, swelling or tenderness. No mastoid tenderness. Tympanic membrane is not injected, not perforated and not erythematous. No middle ear effusion.  Left Ear: Hearing, tympanic membrane, external ear and ear canal normal. No drainage, swelling or tenderness. No mastoid tenderness. Tympanic membrane is not injected, not perforated and not  erythematous.  No middle ear effusion.  Nose: Nose normal. No mucosal edema, rhinorrhea or sinus tenderness. Right sinus exhibits no maxillary sinus tenderness and no frontal sinus tenderness. Left sinus exhibits no maxillary sinus tenderness and no frontal sinus tenderness.  Mouth/Throat: Uvula is midline and oropharynx is clear and moist. Mucous membranes are not pale, not dry and not cyanotic. No oral lesions. No trismus in the jaw. No dental abscesses, uvula swelling or lacerations. No oropharyngeal exudate, posterior oropharyngeal edema, posterior oropharyngeal erythema or tonsillar abscesses.     Eyes: Conjunctivae, EOM and lids are normal. Pupils are equal, round, and reactive to light. Right eye exhibits no chemosis and no discharge. Left eye exhibits no chemosis and no discharge. No scleral icterus.  Neck: Trachea normal, normal range of motion, full passive range of motion without pain and phonation normal. Neck supple. No JVD present. No tracheal tenderness present. No rigidity. No tracheal deviation, no edema, no erythema and normal range of motion present. No thyroid mass and no thyromegaly present.  Cardiovascular: Normal rate, regular rhythm, normal heart sounds and intact distal pulses.  Exam reveals no gallop and no friction rub.   No murmur  heard. Pulmonary/Chest: Effort normal and breath sounds normal. No accessory muscle usage or stridor. No tachypnea. No respiratory distress. He has no decreased breath sounds. He has no wheezes. He has no rhonchi. He has no rales. Chest wall is not dull to percussion. He exhibits no tenderness, no deformity and no retraction.  Abdominal: Soft. Bowel sounds are normal. He exhibits no distension and no mass. There is no tenderness. There is no rebound and no guarding.  Musculoskeletal: Normal range of motion. He exhibits no edema or tenderness.  Amputation of his right first through third toes, 2 cm callus under pad of right foot, no erythema  Lymphadenopathy:    He has no cervical adenopathy.  Neurological: He is alert and oriented to person, place, and time. He has normal reflexes. No cranial nerve deficit. He exhibits normal muscle tone. Coordination normal.  Skin: Skin is warm and dry. No rash noted. He is not diaphoretic. No cyanosis or erythema. No pallor. Nails show no clubbing.  Diffusely dry skin, increasing hyperpigmentation down lower extremities bilaterally, with nonpitting edema  Psychiatric: He has a normal mood and affect. His speech is normal and behavior is normal. Judgment and thought content normal. Cognition and memory are normal.  Nursing note and vitals reviewed.   ED Course  Procedures (including critical care time) Labs Review Labs Reviewed  BASIC METABOLIC PANEL - Abnormal; Notable for the following:    Glucose, Bld 147 (*)    All other components within normal limits  CBC - Abnormal; Notable for the following:    RBC 3.82 (*)    Hemoglobin 11.9 (*)    HCT 36.2 (*)    All other components within normal limits  URINALYSIS, ROUTINE W REFLEX MICROSCOPIC (NOT AT [facility]) - Abnormal; Notable for the following:    Color, Urine AMBER (*)    All other components within normal limits  CBG MONITORING, ED - Abnormal; Notable for the following:    Glucose-Capillary 143 (*)     All other components within normal limits    Imaging Review No results found. I have personally reviewed and evaluated these images and lab results as part of my medical decision-making.   EKG Interpretation None      MDM   Pt was concerned about elevated blood sugar this morning prior to  taking his medication.  He appears well in the ER, CBG here is 143.  Pt has recent steroid use and states increased recent stressors.    Will obtain basic labs and UA.  Labs reveal mild anemia of 11.9, pt refuses hemoccult testing.  Drop in hemoglobin from most recent labs, Hb 14 11/25/15.  Pt denies hematochezia, melena, hemoptysis, pallor, CP, SOB, palpitations.  Other labs unremarkable.  Feel patient is stable to follow up on an outpatient basis with his PCP regarding blood sugars and follow H/H.  Pt was encouraged to do this within one week.  Return precautions reviewed at length.  Pt was discharged in good condition, he was ambulatory, well appearing.      Final diagnoses:  Type 2 diabetes mellitus with hyperglycemia, without long-term current use of insulin (HCC)  Anemia, unspecified anemia type      Delsa Grana, PA-C 12/29/15 [id number]  Forde Dandy, MD 12/30/15 1453

## 2015-12-21 DIAGNOSIS — J441 Chronic obstructive pulmonary disease with (acute) exacerbation: Secondary | ICD-10-CM | POA: Diagnosis not present

## 2015-12-21 NOTE — Progress Notes (Signed)
Subjective:  Patient ID: Jason [name], male    DOB: [birth year]  Age: 75 y.o. MRN: [id number]  CC: Hypertension; Hypothyroidism; and Diabetes   HPI ARGLE MICA presents for he was to have a sleep study. His wife complains of heavy snoring and apnea.  Outpatient Prescriptions Prior to Visit  Medication Sig Dispense Refill  . albuterol (PROVENTIL HFA;VENTOLIN HFA) 108 (90 BASE) MCG/ACT inhaler Inhale 1-2 puffs into the lungs every 6 (six) hours as needed for wheezing or shortness of breath. 3 Inhaler 2  . albuterol (PROVENTIL) (2.5 MG/3ML) 0.083% nebulizer solution Take 3 mLs (2.5 mg total) by nebulization every 4 (four) hours as needed for wheezing. 75 mL 12  . aspirin EC 81 MG tablet Take 1 tablet (81 mg total) by mouth daily. 90 tablet 3  . budesonide-formoterol (SYMBICORT) 80-4.5 MCG/ACT inhaler Inhale 2 puffs into the lungs daily. 1 Inhaler 12  . glimepiride (AMARYL) 2 MG tablet TAKE 1 TABLET EVERY DAY WITH BREAKFAST 90 tablet 3  . metFORMIN (GLUCOPHAGE) 500 MG tablet TAKE 1 TABLET TWICE DAILY WITH MEALS 180 tablet 1  . pantoprazole (PROTONIX) 40 MG tablet Take 1 tablet (40 mg total) by mouth daily. 30 tablet 0  . simvastatin (ZOCOR) 40 MG tablet TAKE 1 TABLET EVERY EVENING 90 tablet 3  . vitamin C (ASCORBIC ACID) 500 MG tablet Take 1 tablet (500 mg total) by mouth daily. 90 tablet 3  . ondansetron (ZOFRAN ODT) 4 MG disintegrating tablet Take 1 tablet (4 mg total) by mouth every 8 (eight) hours as needed for nausea or vomiting. 20 tablet 0   No facility-administered medications prior to visit.    ROS Review of Systems  Constitutional: Positive for fatigue. Negative for fever, chills, diaphoresis and appetite change.  HENT: Negative.   Eyes: Negative.  Negative for visual disturbance.  Respiratory: Positive for apnea. Negative for cough, choking, chest tightness, shortness of breath and stridor.   Cardiovascular: Negative.  Negative for chest pain, palpitations and leg  swelling.  Gastrointestinal: Negative.  Negative for nausea, vomiting, abdominal pain, diarrhea and constipation.  Endocrine: Negative.  Negative for polydipsia, polyphagia and polyuria.  Genitourinary: Negative.   Musculoskeletal: Negative.  Negative for myalgias, back pain and joint swelling.  Skin: Negative.  Negative for color change and rash.  Allergic/Immunologic: Negative.   Neurological: Negative.  Negative for dizziness, tremors, weakness, light-headedness and numbness.  Hematological: Negative.  Negative for adenopathy. Does not bruise/bleed easily.  Psychiatric/Behavioral: Negative.     Objective:  BP 98/50 mmHg  Pulse 85  Temp(Src) 98.5 F (36.9 C) (Oral)  Resp 16  Ht 6\' 8"  (2.032 m)  Wt 307 lb (139.254 kg)  BMI 33.73 kg/m2  SpO2 95%  BP Readings from Last 3 Encounters:  12/19/15 130/66  12/18/15 98/50  11/25/15 138/82    Wt Readings from Last 3 Encounters:  12/18/15 307 lb (139.254 kg)  11/25/15 299 lb 12 oz (135.966 kg)  11/21/15 314 lb 6.4 oz (142.611 kg)    Physical Exam  Constitutional: He is oriented to person, place, and time. No distress.  HENT:  Head: Normocephalic and atraumatic.  Mouth/Throat: Oropharynx is clear and moist. No oropharyngeal exudate.  Eyes: Conjunctivae are normal. Right eye exhibits no discharge. Left eye exhibits no discharge. No scleral icterus.  Neck: Normal range of motion. Neck supple. No JVD present. No tracheal deviation present. No thyromegaly present.  Cardiovascular: Normal rate, regular rhythm, normal heart sounds and intact distal pulses.  Exam reveals no gallop  and no friction rub.   No murmur heard. Pulmonary/Chest: Effort normal and breath sounds normal. No stridor. No respiratory distress. He has no wheezes. He has no rales. He exhibits no tenderness.  Abdominal: Soft. Bowel sounds are normal. He exhibits no distension and no mass. There is no tenderness. There is no rebound and no guarding.  Musculoskeletal: Normal  range of motion. He exhibits no edema or tenderness.  Lymphadenopathy:    He has no cervical adenopathy.  Neurological: He is oriented to person, place, and time.  Skin: Skin is warm and dry. No rash noted. He is not diaphoretic. No erythema. No pallor.  Psychiatric: He has a normal mood and affect. His behavior is normal. Judgment normal.  Vitals reviewed.   Lab Results  Component Value Date   WBC 5.2 12/19/2015   HGB 11.9* 12/19/2015   HCT 36.2* 12/19/2015   PLT 189 12/19/2015   GLUCOSE 147* 12/19/2015   CHOL 134 09/24/2015   TRIG 69.0 09/24/2015   HDL 41.70 09/24/2015   LDLCALC 78 09/24/2015   ALT 37 11/18/2015   AST 21 11/18/2015   NA 142 12/19/2015   K 3.9 12/19/2015   CL 106 12/19/2015   CREATININE 0.72 12/19/2015   BUN 11 12/19/2015   CO2 24 12/19/2015   TSH 1.14 09/24/2015   PSA 0.44 09/24/2015   INR 1.05 10/17/2012   HGBA1C 8.5* 11/25/2015   MICROALBUR 2.3* 09/24/2015    No results found.  Assessment & Plan:   Krishawn was seen today for hypertension, hypothyroidism and diabetes.  Diagnoses and all orders for this visit:  OSA (obstructive sleep apnea) -     Ambulatory referral to Pulmonology   I have discontinued Mr. Trantham's ondansetron. I am also having him maintain his vitamin C, aspirin EC, albuterol, budesonide-formoterol, albuterol, pantoprazole, glimepiride, simvastatin, and metFORMIN.  No orders of the defined types were placed in this encounter.     Follow-up: Return in about 4 months (around 04/16/2016).  Scarlette Calico, MD

## 2015-12-22 ENCOUNTER — Encounter: Payer: Self-pay | Admitting: Podiatry

## 2015-12-22 ENCOUNTER — Ambulatory Visit (INDEPENDENT_AMBULATORY_CARE_PROVIDER_SITE_OTHER): Payer: Commercial Managed Care - HMO | Admitting: Podiatry

## 2015-12-22 VITALS — BP 124/76 | HR 79 | Resp 16

## 2015-12-22 DIAGNOSIS — M79674 Pain in right toe(s): Secondary | ICD-10-CM | POA: Diagnosis not present

## 2015-12-22 DIAGNOSIS — M79675 Pain in left toe(s): Secondary | ICD-10-CM | POA: Diagnosis not present

## 2015-12-22 DIAGNOSIS — L84 Corns and callosities: Secondary | ICD-10-CM

## 2015-12-22 DIAGNOSIS — M2040 Other hammer toe(s) (acquired), unspecified foot: Secondary | ICD-10-CM | POA: Diagnosis not present

## 2015-12-22 DIAGNOSIS — E1149 Type 2 diabetes mellitus with other diabetic neurological complication: Secondary | ICD-10-CM | POA: Diagnosis not present

## 2015-12-22 DIAGNOSIS — B351 Tinea unguium: Secondary | ICD-10-CM

## 2015-12-22 NOTE — Patient Instructions (Signed)
Diabetes and Foot Care Diabetes may cause you to have problems because of poor blood supply (circulation) to your feet and legs. This may cause the skin on your feet to become thinner, break easier, and heal more slowly. Your skin may become dry, and the skin may peel and crack. You may also have nerve damage in your legs and feet causing decreased feeling in them. You may not notice minor injuries to your feet that could lead to infections or more serious problems. Taking care of your feet is one of the most important things you can do for yourself.  HOME CARE INSTRUCTIONS  Wear shoes at all times, even in the house. Do not go barefoot. Bare feet are easily injured.  Check your feet daily for blisters, cuts, and redness. If you cannot see the bottom of your feet, use a mirror or ask someone for help.  Wash your feet with warm water (do not use hot water) and mild soap. Then pat your feet and the areas between your toes until they are completely dry. Do not soak your feet as this can dry your skin.  Apply a moisturizing lotion or petroleum jelly (that does not contain alcohol and is unscented) to the skin on your feet and to dry, brittle toenails. Do not apply lotion between your toes.  Trim your toenails straight across. Do not dig under them or around the cuticle. File the edges of your nails with an emery board or nail file.  Do not cut corns or calluses or try to remove them with medicine.  Wear clean socks or stockings every day. Make sure they are not too tight. Do not wear knee-high stockings since they may decrease blood flow to your legs.  Wear shoes that fit properly and have enough cushioning. To break in new shoes, wear them for just a few hours a day. This prevents you from injuring your feet. Always look in your shoes before you put them on to be sure there are no objects inside.  Do not cross your legs. This may decrease the blood flow to your feet.  If you find a minor scrape,  cut, or break in the skin on your feet, keep it and the skin around it clean and dry. These areas may be cleansed with mild soap and water. Do not cleanse the area with peroxide, alcohol, or iodine.  When you remove an adhesive bandage, be sure not to damage the skin around it.  If you have a wound, look at it several times a day to make sure it is healing.  Do not use heating pads or hot water bottles. They may burn your skin. If you have lost feeling in your feet or legs, you may not know it is happening until it is too late.  Make sure your health care provider performs a complete foot exam at least annually or more often if you have foot problems. Report any cuts, sores, or bruises to your health care provider immediately. SEEK MEDICAL CARE IF:   You have an injury that is not healing.  You have cuts or breaks in the skin.  You have an ingrown nail.  You notice redness on your legs or feet.  You feel burning or tingling in your legs or feet.  You have pain or cramps in your legs and feet.  Your legs or feet are numb.  Your feet always feel cold. SEEK IMMEDIATE MEDICAL CARE IF:   There is increasing redness,   swelling, or pain in or around a wound.  There is a red line that goes up your leg.  Pus is coming from a wound.  You develop a fever or as directed by your health care provider.  You notice a bad smell coming from an ulcer or wound.   This information is not intended to replace advice given to you by your health care provider. Make sure you discuss any questions you have with your health care provider.   Document Released: 10/29/2000 Document Revised: 07/04/2013 Document Reviewed: 04/10/2013 Elsevier Interactive Patient Education 2016 [employer]  

## 2015-12-22 NOTE — Progress Notes (Signed)
   Subjective:    Patient ID: Jason [name], male    DOB: 07/16/1941, 75 y.o.   MRN: [id number]  HPI   75 year old male presents the office today for consented thick, painful, elongated toenails which she cannot trim himself. He does state that he did take off the left second digit toenail himself and there is been some dried blood underneath the area. He also has a callus on the bottom of his right foot that he appeared only shaves off himself. He is diabetic and last A1c was 8.5. His blood sugar this morning was 125. Denies any claudication symptoms. No other complaints at this time.   Review of Systems  HENT: Positive for sinus pressure.   All other systems reviewed and are negative.      Objective:   Physical Exam General: AAO x3, NAD  Dermatological:  Nails are hypertrophic, dystrophic, brittle, discolored, elongated 9. There is tenderness to nails 1-5 bilaterally except for the left second digit toenail which was not present today. There is no swelling erythema or drainage. The nail bed of left second toe this area dried blood how there is no open sore or drainage or other signs of infection. There is a hyperkeratotic lesion right foot submetatarsal 2 as well as the distal left third toe. Upon debridement there is no underlying ulceration, drainage or other signs of infection.  Vascular: Dorsalis Pedis artery and Posterior Tibial artery pedal pulses are palpable bilaterally with immedate capillary fill time. Pedal hair growth present. No varicosities and no lower extremity edema present bilateral. There is no pain with calf compression, swelling, warmth, erythema.   Neruologic:  Sensation appears to be slightly decreased with Derrel Nip monofilament.  Musculoskeletal:  Hammertoes are present.. No pain, crepitus, or limitation noted with foot and ankle range of motion bilateral. Muscular strength 5/5 in all groups tested bilateral.  Gait: Unassisted, Nonantalgic.        Assessment & Plan:   75 year old male with symptomatic onychomycosis, pre-ulcerative lesions 2 -Treatment options discussed including all alternatives, risks, and complications -Nails sharply debrided 9 without complications or bleeding. -Hyperkeratotic lesions 2 sharply debrided without complications or bleeding. -Encouraged him not to try to trim the calluses are remove his toenails himself. This can lead to further ulcerations and infections. -Discussed daily foot inspection. -Follow-up in 3 months or sooner if any problems arise. In the meantime, encouraged to call the office with any questions, concerns, change in symptoms.  Follow-up sooner if is not healing in the left second toe at next 2-3 weeks.  Celesta Gentile, DPM

## 2015-12-23 ENCOUNTER — Other Ambulatory Visit: Payer: Self-pay | Admitting: *Deleted

## 2015-12-23 NOTE — Patient Outreach (Signed)
[facility]) Care Management   12/23/2015  Jason [name] [birth year] [id number]  Jason [name] is an 75 y.o. 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 Pt states due to this task he neglected some of his care.  Pt states he is in the process of placing this pt but has yet to choose a facility due to ongoing paperwork and awaiting pt's doctors to assist with this process.  COPD: Pt sates he is aware of the COPD action zone discussed on last visit and has been using his nebulizer an inhalers as recommended with enough supplies and refills. Pt states he is in the GREEN zone today and breathing with no distress with no symptoms mentioned.  DM: Pt discussed issues with is last admission related to an increase CBG of 502. 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  Pt indicates he is more aware how his diabetes is affected with added stressors to his life and will attempt to manage his diabetes more closely. Pt reports his attempts to eat healthier and he has seen improved readings however continues to states he is not regulated with some readings in the 200s (tools and information provided).   RESOURCES: Pt states he is working on paperwork for placing his uncle into a nursing facility. Also states he is trying to obtain [employer] benefits for his uncle (resoruces provided).  Objective:   Review of Systems  Constitutional: Negative.   HENT: Positive for congestion.        Right lower lobe with no fevers or chills (asymptomatic).  Eyes: Negative.   Respiratory: Positive for cough and sputum production.        Pt reports now and then however no fevers or chills associated.    Cardiovascular: Negative.   Gastrointestinal: Negative.   Genitourinary: Negative.   Musculoskeletal: Negative.   Skin:  Negative.   Neurological: Negative.   Endo/Heme/Allergies: Negative.   Psychiatric/Behavioral: Negative.     Physical Exam  Constitutional: He is oriented to person, place, and time. He appears well-developed and well-nourished.  HENT:  Right Ear: External ear normal.  Left Ear: External ear normal.  Eyes: EOM are normal.  Neck: Normal range of motion.  Cardiovascular: Normal heart sounds.   Respiratory: Effort normal.  Slight crackles to lower right lower (productive cough)  GI: Soft. Bowel sounds are normal.  Musculoskeletal: Normal range of motion.  Neurological: He is alert and oriented to person, place, and time.  Skin: Skin is warm and dry.  Psychiatric: He has a normal mood and affect. His behavior is normal. Judgment and thought content normal.    Current Medications:   Current Outpatient Prescriptions  Medication Sig Dispense Refill  . albuterol (PROVENTIL HFA;VENTOLIN HFA) 108 (90 BASE) MCG/ACT inhaler Inhale 1-2 puffs into the lungs every 6 (six) hours as needed for wheezing or shortness of breath. 3 Inhaler 2  . albuterol (PROVENTIL) (2.5 MG/3ML) 0.083% nebulizer solution Take 3 mLs (2.5 mg total) by nebulization every 4 (four) hours as needed for wheezing. 75 mL 12  . aspirin EC 81 MG tablet Take 1 tablet (81 mg total) by mouth daily. 90 tablet 3  . budesonide-formoterol (SYMBICORT) 80-4.5 MCG/ACT inhaler Inhale 2 puffs into the lungs daily. 1 Inhaler  12  . glimepiride (AMARYL) 2 MG tablet TAKE 1 TABLET EVERY DAY WITH BREAKFAST 90 tablet 3  . metFORMIN (GLUCOPHAGE) 500 MG tablet TAKE 1 TABLET TWICE DAILY WITH MEALS 180 tablet 1  . simvastatin (ZOCOR) 40 MG tablet TAKE 1 TABLET EVERY EVENING 90 tablet 3  . vitamin C (ASCORBIC ACID) 500 MG tablet Take 1 tablet (500 mg total) by mouth daily. 90 tablet 3  . pantoprazole (PROTONIX) 40 MG tablet Take 1 tablet (40 mg total) by mouth daily. (Patient not taking: Reported on 12/23/2015) 30 tablet 0   No current  facility-administered medications for this visit.    Functional Status:   In your present state of health, do you have any difficulty performing the following activities: 11/16/2015 11/05/2015  Hearing? N N  Vision? N N  Difficulty concentrating or making decisions? N N  Walking or climbing stairs? Y Y  Dressing or bathing? N N  Doing errands, shopping? N N    Fall/Depression Screening:    PHQ 2/9 Scores 11/21/2015 09/25/2015 09/24/2015 09/24/2015 07/10/2013  PHQ - 2 Score 0 - 2 0 4  PHQ- 9 Score - - 9 - 7  Exception Documentation - Medical reason - - -   Filed Vitals:   12/23/15 1123  BP: 118/58  Pulse: 74  Resp: 20    Assessment:   Stress related to caregiver to uncle Case management related to COPD Diabetes education related to recent ED (read 61) Resources in the community related to respite case and caregiver to family member   Plan:  Physical assessment completed with noted issues and concerns. Pt continues to be the primary caregiver for his uncle and this has resulted in fatigue for this pt and he is not able to care for himself as well as before. Will discussed LOC facility and encouraged pt to visit with his uncle for possible placement (lock down facilities) needed due to pt's memory status.  Strongly encouraged pt to seek assistance from other family members to reduce his stress as he continues to manage his medical issues. Will review and inquire on pt's management of care related to her COPD as reported pt remains in the GREEN zone however has an ocassional productive cough with that has been green in the past. Pt states her provider is aware but no fevers, chills or related symptoms have been encountered with this discoloration and productive cough. Pt also states when he has a coughing spell he uses his Symbicort and it is usually resolved. Verified no GI upset or issues with his appetite and pt is without distressful breathing. Pt reports use of his ongoing nebulizer and  inhalers with no problems.  Will also educate on provided needed information concerning his diabetes management. Discussed the importance of managing his stress and how this can affect his overall health related to managing his diabetes. Verified no additional high readings however pt remains under stress until he is able to place his uncle into a facility. Stongely encouraged pt to seek assistance from family in caring for his uncle due to the stress this has on his medical condition. Review the signs and symptoms of hyperglycemia and prevention measures with health eating habits and seek medical assistance early to prevent elevated reading and DKA. Pt with an understanding. RN also provided monitoring tools and educational printed material to assist in this managment of care. Will also discussed area resources for possible placement for his uncle for a locked down unit due to pt's memory issues.  RN provided information and contact numbers for help information. Plan of care discussed with goal that were adjusted accordingly. Will verified pt understanding of the above information discussed and reschedule the next home visit.   Raina Mina, RN Care Management Coordinator [facility] [phone]

## 2015-12-26 ENCOUNTER — Encounter: Payer: Self-pay | Admitting: Podiatry

## 2015-12-31 ENCOUNTER — Ambulatory Visit: Payer: Commercial Managed Care - HMO | Admitting: Pulmonary Disease

## 2016-01-01 ENCOUNTER — Encounter: Payer: Self-pay | Admitting: Pulmonary Disease

## 2016-01-01 ENCOUNTER — Ambulatory Visit (INDEPENDENT_AMBULATORY_CARE_PROVIDER_SITE_OTHER): Payer: Commercial Managed Care - HMO | Admitting: Pulmonary Disease

## 2016-01-01 VITALS — BP 118/62 | HR 75 | Temp 98.3°F | Ht >= 80 in | Wt 311.0 lb

## 2016-01-01 DIAGNOSIS — J4530 Mild persistent asthma, uncomplicated: Secondary | ICD-10-CM | POA: Diagnosis not present

## 2016-01-01 DIAGNOSIS — J45909 Unspecified asthma, uncomplicated: Secondary | ICD-10-CM | POA: Insufficient documentation

## 2016-01-01 DIAGNOSIS — I059 Rheumatic mitral valve disease, unspecified: Secondary | ICD-10-CM

## 2016-01-01 DIAGNOSIS — J4489 Other specified chronic obstructive pulmonary disease: Secondary | ICD-10-CM

## 2016-01-01 DIAGNOSIS — E785 Hyperlipidemia, unspecified: Secondary | ICD-10-CM

## 2016-01-01 DIAGNOSIS — Z87891 Personal history of nicotine dependence: Secondary | ICD-10-CM

## 2016-01-01 DIAGNOSIS — J449 Chronic obstructive pulmonary disease, unspecified: Secondary | ICD-10-CM | POA: Diagnosis not present

## 2016-01-01 DIAGNOSIS — E1165 Type 2 diabetes mellitus with hyperglycemia: Secondary | ICD-10-CM

## 2016-01-01 DIAGNOSIS — IMO0002 Reserved for concepts with insufficient information to code with codable children: Secondary | ICD-10-CM

## 2016-01-01 DIAGNOSIS — I739 Peripheral vascular disease, unspecified: Secondary | ICD-10-CM

## 2016-01-01 DIAGNOSIS — E118 Type 2 diabetes mellitus with unspecified complications: Secondary | ICD-10-CM

## 2016-01-01 DIAGNOSIS — E669 Obesity, unspecified: Secondary | ICD-10-CM

## 2016-01-01 DIAGNOSIS — G4733 Obstructive sleep apnea (adult) (pediatric): Secondary | ICD-10-CM

## 2016-01-01 MED ORDER — BUDESONIDE-FORMOTEROL FUMARATE 80-4.5 MCG/ACT IN AERO: 2.0000 | INHALATION_SPRAY | Freq: Two times a day (BID) | RESPIRATORY_TRACT | Status: DC

## 2016-01-01 NOTE — Progress Notes (Addendum)
Subjective:    Patient ID: Jason [name], male    DOB: 1941-11-15, 75 y.o.   MRN: [id number]  HPI 75 y/o BM here for a follow up visit... he has mult med problems as listed below>> ~  SEE PREV EPIC NOTES FOR OLDER DATA >>   ~  Adm 07/2011 for SBO & required ELap w/ lysis of adhesions by DrBlackman, [facility]; post-op problems included ileus, cough, pleural effusion, & minor wound complic managed as outpt & now back to baseline...  LABS 7/13:  Chems- ok x BS=116, A1c=7.1  LABS 1/14:  FLP- at goals on Simva40;  Chems- ok x BS=146, A1c=7.2;  TSH=1.19, PSA=0.38   ~  July 10, 2013:  58moROV & Jason [name] has a rash on his arm- pruritic, was working in his yard, & c/w mild poison ivy- improved w/ Calamine & OTC meds... We reviewed the following medical problems during today's office visit >>`    MVP>  On ASA daily & 2DEcho 10/11 showed mild LVH, norm LVF w/ EF= 55-60%, ant leaflet MVP seen w/ MR, mod LAdil; he denies CP, palpit, SOB, edema...    Ven Insuffic>  Hx VV, VI, stasis changes w/ ulcer/ cellulitis in past; s/p laser to left GSV by DrEarly & s/p right 2nd toe amp for osteomyelitis by DrBednarz 2010.    Chol>  On Simva40 and FLP 1/14 shows TChol 136, TG 109, HDL 44, LDL 71... keep same dose + better diet & get wt down!    DM>  On Metformin 500Bid & Glimep'1mg'$ Qam; BS at home are improved & Labs here 8/14 showed BS= 74, A1c= 6.4; encouraged to lose wt...    Obesity>  weight is down to 314# today; we reviewed the critical need for wt reducting diet + exercise program...    GI>  Adm 9/12 w/ SBO & had ELap w/ lysis of adhesions;  Now back to baseline w/o abd pain, N/V/D/C/ or blood seen...    GU>  He saw DrTannenbaum et al 3/12 (we don't have more recent notes) for f/u BPH, ED, & PSA recheck;  Voiding satis, given Cialis & PSA= 0.47 (he had 58cc PVR- not on meds at present); PSA checks here have been stable at 0.38...    DJD/ LBP>  Hx LBP w/ walking, no radiation;  Hx right foot problems w/ amp 1st 3 toes  now for osteomyelitis ([facility]iatrist, [place]...    Anxiety>  He related difficulty caring for family esp elderly mother who is quite demanding & conflict w/ his daughter/ her 2 kids/ etc... We reviewed prob list, meds, xrays and labs> see below for updates >>   LABS 8/14:  FLP- ok w/ BS=74, A1c=6.4, Cr=1.4..Marland Kitchen   ~  October 08, 2015:      89yrOV & asked to check pt by DrTJones due to cough> Jason [name] has only a minimal smoking hx- started in his late teens, smoked for only 12 yrs up to 1/2 ppd at the max, and quit in 1973 (age31); he worked at [employer] 1971 to 2005 & was exposed to 2nd hand smoke;  He has had an occas bronchitic infection, and one bout of pneumonia in 2009, but no recurrent infections, no hx TB or other toxic exposures (no known asbestos etc);  He tells me that he has been under a lot of stress- caring for wife, elderly uncle who lives w/ them, and his 9148/o mother w/ cancer now on hospice;  Due to the stress he  started smoking again 06/2014 up to 1ppd but had his last cigarette ~2wks ago- says he has quit for good now;  He is c/o cough, mild chest congestion, small amt beige sputum w/o hemoptysis; he notes occas wheezing but denies SOB, CP, palpit, ch in edema- he is still able to mow yard, walking, does chores/ shopping etc w/o giving out. W/o SOB, etc;  He notes that mucinex helps the congestion...    EXAM shows Afeb, VSS, O2sat=97% on RA;  HEENT- neg, mallampati2;  Chest- clear, sl decr BS at bases, congested cough, w/o w/r/r;  Heart- RR gr1/6SEM w/o r/g;  Abd- obese, soft, neg;  Ext- +VI otherw neg w/o c/c/e...   Last CXR 09/24/15 showed norm heart size, linear atx vs scarring at left base, otherw clear, NAD...   Spirometry 10/08/15 showed FVC=3.51 (69%), FEV1=2.68 (71%), %1sec=76%, mid-flows reduced at 64% predicted... This is suggestive of mild restrictive ventilatory defect w/ superimposed mild small airways dis...   Ambulatory oxygen saturation test 10/08/15> O2sat=97%  on RA at rest w/ pulse=72/min; he ambulated 3 laps in the office w/ lowest O2sat=92% w/ pulse=99/min...   LABS 09/2015>  Chems- wnl x BS=149, A1c=6.3;  CBC- wnl w/ Hg=14.3, WBC=5.4 & 8%eos;  TSH=1.14;  PSA=0.44 IMP/PLAN>>  Jason [name] has a min smoking hx but restarted ~47yr ago, then quit again 2 wks ago; c/o cough & congestion w/ some intermittent wheezing c/w chr bronchitis; his Spirometry show some mild restriction suspected and superimposed small airways dis;  Now that he has quit smoking he does not want to get on a regular inhaler if not absolutely needed, so we decided to treat w/ PROAIR-HFA 1-2 sp Q6H prn + MUCINEX 600mg - 1-2Bid w/ fluids;  If he does not improve rapidly/ resolve- then we could prescribe an ICS/LABA or use parenteral therapy if needed... We discussed an ROV in 2-71mo, sooner if needed.  ~  January 01, 2016:  48mo ROV w/ SN>   Since Jason [name] was here last he has had 2 Hospitalizations:    1st Hosp was 12/21 - 11/07/15 by [facility]>  He had just been to the ER w/ bronchitis & treated w/ ZPak & Tussionex;  Presented w/ 2wk hx cough, thick yellow sput, wheezing, SOB, chest tightness;  Found to have CAP-nos, CXR showed mild cardiomeg, chr LLL scarring & superimposed LLL infiltrate; Flu panel- neg, CBC was wnl w/ 7K WBC; Chems- wnl x BS17-220 range;  Treated w/ Levaquin, NEBS, & Solumedrol=> Pred taper...    2nd Hosp was 1/1 - 11/19/15 by [facility]>  Presented to Er w/ recurrent wheezing, chest congestion, SOB & Adm w/ a "COPD exac" & O2sat said to be ibn the mid-80's on RA w/ ambulation in ER; No cultures done; CXR showed stable left basilar scarring, NAD;  LABS showed elev WBC=7K on Adm 7 incr to 16K w/ Solumedrol rx, Chems- wnl x BS up to 400 w/ steroids, BNP=46, A1c=8.5;  He was treated w/ more Levaquin, Solumedrol=> Pred taper, NEBS, Mucinex, etc...    Since disch he has followed up w/ DrJones x2, [facility], & went back to the ER 12/22/15 w/ hyperglycemia;  We reviewed the following medical problems during  today's office visit >>     Restrictive lung disease> due to obesity, chest wall factors, & anxiety; we reviewed diet, exercise, wt reduction strategies + offered anxiolytic rx but he declines...     Hx asthmatic bronchitis/ pneumonia> on Symbicort 2spQd, NEBS w/ Albut vs HFA inhaler prn; notes sl cough, denies dyspnea;  rec to incr Symbicort80-2spBid & use the NEBS Bid for now...    Ex-smoker> he has a min smoking hx ~10 pack-yrs total & quit in 0947; he inexplicably took it up again in 2016 but then quit again later that yr...     R/O OSA>  Pt says that his wife notes apneas while he sleeps but not c/o snoring; he has very poor sleep hygiene due to elderly uncle living w/ them; he rests poorly he says but often wakes refreshed; does note sleep pressure during the day & naps 3-4days per wk; his ESS= 18/24 and we will order a split night sleep test...     MVP>  On ASA daily & 2DEcho 10/11 showed mild LVH, norm LVF w/ EF= 55-60%, ant leaflet MVP seen w/ MR, mod LAdil; he denies CP, palpit, SOB, ch in edema...    Ven Insuffic>  Hx VV, VI, stasis changes w/ ulcer/ cellulitis in past; s/p laser to left GSV by DrEarly & s/p right 2nd toe amp for osteomyelitis by DrBednarz 2010.    Chol>  On Simva40 and FLP 11/16 shows TChol 134, TG 69, HDL 42, LDL 78... keep same dose + better diet & get wt down!    DM>  On Metformin 500Bid & Glimep24mQam; BS at home are improved & Labs here 1/17 showed BS= 150-300, A1c= 8.5; encouraged to get on diet, get wt down, f/u w/ PCP...    Obesity>  weight is down to 311# today; we reviewed the critical need for wt reducting diet + exercise program...    GI>  Adm 9/12 w/ SBO & had ELap w/ lysis of adhesions;  Now back to baseline w/o abd pain, N/V/D/C/ or blood seen...    GU>  He saw DrTannenbaum et al 3/12 (we don't have more recent notes) for f/u BPH, ED, & PSA recheck;  Voiding satis, prev on Cialis & PSAs have been good...    DJD/ LBP>  Hx LBP w/ walking, no radiation;  Hx right  foot problems w/ amp 1st 3 toes now for osteomyelitis ([facility]...    Anxiety>  He related difficulty caring for family esp elderly mother who is quite demanding, then an uncle, & conflict w/ his daughter/ her 2 kids/ etc... EXAM shows Afeb, VSS, O2sat=97% on RA;  HEENT- neg, mallamapti1;  Chest decr BS, clear, Gr2/6sys murmur w/o r/g;  Abd- obeses soft, neg;  Ext- VI, stasis changes, chr edema bilat; Neuro- neuropathy w/o focal motor abd...  CXR 11/2015 showed mild cardiomeg, chr LLL scarring & superimposed LLL infiltrate=> improved   Spirometry 01/01/16> FVC=3.76 (74%), FEV1=2.84 (75%), %1sec=76, mid-flows sl reduced at 64% predicted;  This is c/w mild restrictive ventilatory defect & superimposed small airways dis...  Ambulatory oximetry 01/01/16> O2sat=97% on RA at rest w/ pulse=72/min;  He ambulated 3 laps in office w/ lowest O2sat=90% w/ heart rate 100/min IMP/PLAN>>  As above- he has a min smoking hx and evid of Restrictive lung dis at baseline; recent bronchitis=> [place] inflamm [place] getting back to baseline 7 rec that he use the Symbicort80-2spBid for the airway inflamm & to keep him off Pred;  OK to use NEBS, rescue inhaler as needed;  We will order a split night sleep test & a f/u 2DEcho...  ADDENDUM> Sleep Study 01/09/16> severe OSA w/ AHI=59.4/hr, REM AHI=68.2/hr, Min O2sat=77%, Mean=92%, Time <88%=11.456m, NSR- no arrhythmias, no signif PLMS;  Split night study showed good control on CPAP=13 (few central  apneas noted);  REC> large [employer] full face Simplus mask, Res Med S10 machine Air/Auto w/ heated humidity, climate controlled tubing, Auto CPAP 5-15, enroll in [employer]View;  He needs ROV w/ download in 6-8 weeks... ADDENDUM> 2DEcho 01/14/16> mild conc LVH, norm sys function & no regional wall motion abnormalities, AoV mod thickened & calcif leaflets w/ restricted mobility, MV leaflets mod thicked & calcif w/ reduced separation c/w mild-mod  stenosis & mild MR, mod LA dil (68mm), RV norm w/ norm sys function/ RA normal/ PAsys wnl; we will refer to CARDS...            Problem List:     His PCP is DrTJones, [place]...   Hx of SINUSITIS (ICD-473.9)  Hx Pneumonia - seen in ER 7/09 w/ faint RLL infiltrate that cleared serially w/ antibiotic Rx... ~  CXR 9/13 showed norm heart size, clear lungs w/ mild left basilar scarring & mild degen changes in Tspine... ~  [facility] 10/2015 w/ ?LLL infiltrate/ scarring; treated w/ Levaquin, Pred, Mucinex, etc...   MITRAL VALVE PROLAPSE (ICD-424.0) - on ASA 81mg /d... he denies CP, palpit, dyspnea... exam w/ MR murmur... ~  2DEcho 5/01 showed mild ant leaflet MVP, mild MR, norm LVF. ~  f/u 2DEcho 10/11 showed mild LVH, norm LVF w/ EF= 55-60%, ant leaflet MVP seen w/ MR, mod LAdil ~  EKG 9/12 showed NSR, rate80, NSSTTWA ~  EKG 9/13 showed NSR, rate 79, NSSTTWA... ~  EKG 11/2015 showed NSR w/ PACs, rate71, 1st degree AVB, incr voltage (r/o LVH), otherw wnl...  VENOUS INSUFFICIENCY (ICD-459.81) - he knows to elim salt, elevate legs, wear TED's... ~  neg ven dopplers in 1996...  ~  he saw DrLawson in 2000 for a ven stasis ulcer on his leg... rec for TED's, no salt, elevation, etc...  ~  he had normal ABI's in 10/06... ~  3/10:  left leg wound w/ cellulitis & +MRSA... Rx'd and refer back to wound care clinic. ~  7/10:  sched for right second toe amputation due to osteomyelitis despite hyperbaric O2 etc... ~  8/10: s/p right second toe amp for osteomyelitis [facility]), and s/p laser ablation of left greater saphenous vein (DrEarly). ~  7/13:  He tells me that DrTuckman, Podiatrist has been treating his right great toe (ulcer on plantar surface)- we will call for notes. ~  He subsequently had 2 more toes amputated by DrDuda; subseq well healed & doing satis... ~  Chronic VI & stasis changes w/ chr edema...  HYPERCHOLESTEROLEMIA (ICD-272.0) - on SIMVASTATIN 40mg  Qhs...  DIABETES MELLITUS (ICD-250.00) - on  METFORMIN 500mg  Bid + GLIMEPIRIDE 2mg /d, and diet efforts...  OBESITY (ICD-278.00) - he is very large at 6\' 8"  tall and >300# for the last 10+ yrs... peak = 340#... he drinks beer daily... discussed diet + exercise program (and no beer)... ~  he had transient right gynecomastia 9/09, no discharge, neg mammogram & resolved on it's own...  GERD (ICD-530.81) >> not currently taking a PPI med...  DIVERTICULOSIS OF COLON (ICD-562.10) / IRRITABLE BOWEL SYNDROME (ICD-564.1) & COLONIC POLYPS (ICD-211.3) ~  colonoscopy 3/06 by DrStark showing divertics & 2-59mm polyps (adenomas)... ~  colonoscopy 9/11 by DrStark showed divertics & 52mm polyp= tubular adenoma... f/u planned 60yrs. ~  colonoscopy 7/16 by DrStark showed mild diertics, int hems, 5 polyps 4-65mm size & Bx= tubular adenomas and one serrated polyp, no atypia...  BENIGN PROSTATIC HYPERTROPHY & ED - eval by DrTannenbaum 7/08 and Rx w/ Cialis; he has tried the PEP  shots; + fam hx prostate ca in his father... ~  8/10:  DrTannenbaum did circ for phimosis, pt states that PSA check was OK... ~  3/12:  f/u BPH, ED, & PSA recheck;  Voiding satis, given Cialis & PSA= 0.47 (he had 58cc PVR- not on meds at present).  DEGENERATIVE JOINT DISEASE (ICD-715.90) - he uses OTC meds Prn, and was rec to start Vit supplements by DrTannenbaum- takes MVI, VitC, VitD... he has seen DrHilts in the past... ~  He has had the 1st 3 toes of right foot amputated...  Hx of HIDRADENITIS (ICD-705.83)  ANXIETY>  He related difficulty caring for family esp elderly mother who is quite demanding & conflict w/ his daughter/ her 2 kids/ etc...  Health Maintenance - up to date on colon & prostate screens... PNEUMOVAX 9/09... TETANUS booster 3/10... gets yearly Flu vaccines in Fall.   Past Surgical History  Procedure Laterality Date  . Knee arthroscopy    . Toe amputation  06/2009    x3Right foot second toe -Dr Beola Cord  . Laser ablation  06/2009    Left Greater Saphenous vein -Dr  Donnetta Hutching  . Circumcision  06/2100    For Phimosis - Dr Hartley Barefoot  . Laparotomy  07/20/11  . Colon surgery    . Tonsillectomy    . Amputation  08/09/2012    Procedure: AMPUTATION DIGIT;  Surgeon: Newt Minion, MD;  Location: [facility];  Service: Orthopedics;  Laterality: Right;  Amputation right Great Toe MTP Joint  . Amputation  10/17/2012    Procedure: AMPUTATION DIGIT;  Surgeon: Newt Minion, MD;  Location: [facility];  Service: Orthopedics;  Laterality: Right;  Right Foot 3rd Toe Amputation at Metatarsophalangeal Joint    Outpatient Encounter Prescriptions as of 01/01/2016  Medication Sig  . albuterol (PROVENTIL HFA;VENTOLIN HFA) 108 (90 BASE) MCG/ACT inhaler Inhale 1-2 puffs into the lungs every 6 (six) hours as needed for wheezing or shortness of breath.  Marland Kitchen albuterol (PROVENTIL) (2.5 MG/3ML) 0.083% nebulizer solution Take 3 mLs (2.5 mg total) by nebulization every 4 (four) hours as needed for wheezing.  Marland Kitchen aspirin EC 81 MG tablet Take 1 tablet (81 mg total) by mouth daily.  . budesonide-formoterol (SYMBICORT) 80-4.5 MCG/ACT inhaler Inhale 2 puffs into the lungs 2 (two) times daily.  Marland Kitchen glimepiride (AMARYL) 2 MG tablet TAKE 1 TABLET EVERY DAY WITH BREAKFAST  . metFORMIN (GLUCOPHAGE) 500 MG tablet TAKE 1 TABLET TWICE DAILY WITH MEALS  . simvastatin (ZOCOR) 40 MG tablet TAKE 1 TABLET EVERY EVENING  . vitamin C (ASCORBIC ACID) 500 MG tablet Take 1 tablet (500 mg total) by mouth daily.  . [DISCONTINUED] budesonide-formoterol (SYMBICORT) 80-4.5 MCG/ACT inhaler Inhale 2 puffs into the lungs daily.  . [DISCONTINUED] pantoprazole (PROTONIX) 40 MG tablet Take 1 tablet (40 mg total) by mouth daily. (Patient not taking: Reported on 12/23/2015)   No facility-administered encounter medications on file as of 01/01/2016.    No Known Allergies   Current Medications, Allergies, Past Medical History, Past Surgical History, Family History, and Social History were reviewed in [employer] record.     Review of Systems         See HPI - all other systems neg except as noted> c/o recent cough, chest congestion The patient denies anorexia, fever, weight loss, weight gain, vision loss, decreased hearing, hoarseness, chest pain, syncope, dyspnea on exertion, peripheral edema, headaches, hemoptysis, abdominal pain, melena, hematochezia, severe indigestion/heartburn, hematuria, incontinence, muscle weakness, suspicious skin lesions, transient blindness, difficulty  walking, depression, unusual weight change, abnormal bleeding, enlarged lymph nodes, and angioedema.     Objective:   Physical Exam     WD, Overweight, very large 75 y/o BM in NAD... GENERAL:  Alert & oriented; pleasant & cooperative. HEENT:  [place]/AT, EOM-wnl, PERRLA, EACs-clear, TMs-wnl, NOSE-clear, THROAT-clear & wnl (mallamapti2) NECK:  Supple w/ fairROM; no JVD; normal carotid impulses w/o bruits; no thyromegaly or nodules palpated; no lymphadenopathy. CHEST:  Clear to P & A; without wheezes/ rales/ or rhonchi heard, no consolidation... HEART:  Regular Rhythm; gr 1-2/6 SEM without rubs or gallops detected... ABDOMEN:  Soft & nontender; normal bowel sounds; no organomegaly or masses detected. EXT: without deformities, mod arthritic changes; wearing TEDs +varicose veins/ +venous insuffic/ chr edema... NEURO:  CN's intact;  no focal neuro deficits... Derm:  Mild stasis changes in LEs w/o cellulitis etc...  RADIOLOGY DATA:  Reviewed in the EPIC EMR & discussed w/ the patient...  LABORATORY DATA:  Reviewed in the EPIC EMR & discussed w/ the patient...   Assessment & Plan:       1) Restrictive lung disease> due to obesity, chest wall factors, & anxiety; we reviewed diet, exercise, wt reduction strategies + offered anxiolytic rx but he declines.Marland KitchenMarland Kitchen     2) Hx asthmatic bronchitis/ pneumonia> on Symbicort 2spQd, NEBS w/ Albut vs HFA inhaler prn; notes sl cough, denies dyspnea; rec to incr Symbicort80-2spBid & use the NEBS Bid for  now...    3) Ex-smoker> he has a min smoking hx ~10 pack-yrs total & quit in 0000000; he inexplicably took it up again in 2016 but then quit again later that yr...     4) R/O OSA>  Pt says that his wife notes apneas while he sleeps but not c/o snoring; he has very poor sleep hygiene due to elderly uncle living w/ them; he rests poorly he says but often wakes refreshed; does note sleep pressure during the day & naps 3-4days per wk; his ESS= 18/24 and we will order a split night sleep test...   MEDICAL ISSUES per DrTJones>>     MVP>  Mild MVP on 2DEcho & he denies CP, palpit, ch in SOB, edmea, etc...    Ven Insuffic>  He had vein surg from DrEarly; knows to elim sodium, elevate legs, wear support hose, etc...    Chol>  On Simva40 + diet;  FLP looks good...    DM>  Controlled on Metformin & Glimepiride; he needs better diet, incr exercise, get wt down...       Diabetic foot ulcer> he lost right 2nd toe to osteomyelitis in 2010; now w/ ulcer on plantar aspect great toe> under the care of Podiatry=> DrDuda had to amputate 2 toes 9/13...    Obesity>  As noted, wt down to 310# after his hosp for SBO; then right back up to 329# after the holidays...    GI> s/p SBO w/ surg, Hx GERD/ Divertics/ IBS/ Polyps> GI per DrStark & post-op DrBlackman...    GU> BPH & ED followed by DrTannenbaum...    DJD> on OTC analgesics as needed...    ANXIETY>  We discussed coping strategies for the family stress...  PLAN>>  As above- he has a min smoking hx and evid of Restrictive lung dis at baseline; recent bronchitis=> airway inflamm & pneumonia (nos) treated in [place]; now getting back to baseline 7 rec that he use the Symbicort80-2spBid for the airway inflamm & to keep him off Pred;  OK to use NEBS, rescue inhaler  as needed;  We will order a split night sleep test & a f/u 2DEcho...   Patient's Medications  New Prescriptions   No medications on file  Previous Medications   ALBUTEROL (PROVENTIL HFA;VENTOLIN HFA) 108 (90 BASE)  MCG/ACT INHALER    Inhale 1-2 puffs into the lungs every 6 (six) hours as needed for wheezing or shortness of breath.   ALBUTEROL (PROVENTIL) (2.5 MG/3ML) 0.083% NEBULIZER SOLUTION    Take 3 mLs (2.5 mg total) by nebulization every 4 (four) hours as needed for wheezing.   ASPIRIN EC 81 MG TABLET    Take 1 tablet (81 mg total) by mouth daily.   GLIMEPIRIDE (AMARYL) 2 MG TABLET    TAKE 1 TABLET EVERY DAY WITH BREAKFAST   METFORMIN (GLUCOPHAGE) 500 MG TABLET    TAKE 1 TABLET TWICE DAILY WITH MEALS   SIMVASTATIN (ZOCOR) 40 MG TABLET    TAKE 1 TABLET EVERY EVENING   VITAMIN C (ASCORBIC ACID) 500 MG TABLET    Take 1 tablet (500 mg total) by mouth daily.  Modified Medications   Modified Medication Previous Medication   BUDESONIDE-FORMOTEROL (SYMBICORT) 80-4.5 MCG/ACT INHALER budesonide-formoterol (SYMBICORT) 80-4.5 MCG/ACT inhaler      Inhale 2 puffs into the lungs 2 (two) times daily.    Inhale 2 puffs into the lungs daily.  Discontinued Medications   PANTOPRAZOLE (PROTONIX) 40 MG TABLET    Take 1 tablet (40 mg total) by mouth daily.

## 2016-01-01 NOTE — Patient Instructions (Signed)
Today we updated your med list in our EPIC system...     We decided to make an adjustment in your meds>     Increase the SYMBICORT-80 to 2 sprays twice daily...  OK to continue the NEBULIZER & the rescue inhaler as you are doing...   We will arrange for a SLEEP STUDY and a follow up 2DEchocardiogram...    We will contact you w/ the results when available...   Call for any questions...  Let's plan a follow up visit in 6-8weeks, sooner if needed for problems.Marland KitchenMarland Kitchen

## 2016-01-09 ENCOUNTER — Ambulatory Visit (HOSPITAL_BASED_OUTPATIENT_CLINIC_OR_DEPARTMENT_OTHER): Payer: Commercial Managed Care - HMO | Attending: Pulmonary Disease

## 2016-01-09 VITALS — Ht >= 80 in | Wt 311.0 lb

## 2016-01-09 DIAGNOSIS — G4737 Central sleep apnea in conditions classified elsewhere: Secondary | ICD-10-CM | POA: Diagnosis not present

## 2016-01-09 DIAGNOSIS — G4733 Obstructive sleep apnea (adult) (pediatric): Secondary | ICD-10-CM | POA: Diagnosis not present

## 2016-01-09 DIAGNOSIS — G4731 Primary central sleep apnea: Secondary | ICD-10-CM

## 2016-01-12 DIAGNOSIS — G4739 Other sleep apnea: Secondary | ICD-10-CM | POA: Diagnosis not present

## 2016-01-12 DIAGNOSIS — G4737 Central sleep apnea in conditions classified elsewhere: Secondary | ICD-10-CM | POA: Diagnosis not present

## 2016-01-12 DIAGNOSIS — G4731 Primary central sleep apnea: Secondary | ICD-10-CM | POA: Insufficient documentation

## 2016-01-12 DIAGNOSIS — G4733 Obstructive sleep apnea (adult) (pediatric): Secondary | ICD-10-CM | POA: Diagnosis not present

## 2016-01-12 NOTE — Progress Notes (Signed)
Patient Name: Jason [name], Jason [name] Study Date: 01/09/2016 Gender: Male D.O.B: 03/28/1941 Age (years): 74 Referring Provider: Scott Nadel Height (inches): 78 Interpreting Physician:   MD, ABSM Weight (lbs): 311 RPSGT: Dubili, Fred BMI: 36 MRN: [id number] Neck Size: 18.50  CLINICAL INFORMATION Sleep Study Type: Split Night CPAP Indication for sleep study: Fatigue, OSA, Snoring Epworth Sleepiness Score:  SLEEP STUDY TECHNIQUE As per the AASM Manual for the Scoring of Sleep and Associated Events v2.3 (April 2016) with a hypopnea requiring 4% desaturations. The channels recorded and monitored were frontal, central and occipital EEG, electrooculogram (EOG), submentalis EMG (chin), nasal and oral airflow, thoracic and abdominal wall motion, anterior tibialis EMG, snore microphone, electrocardiogram, and pulse oximetry. Continuous positive airway pressure (CPAP) was initiated when the patient met split night criteria and was titrated according to treat sleep-disordered breathing.  MEDICATIONS Medications taken by the patient : reviewed in electronic medical record. Medications administered by patient during sleep study : No sleep medicine administered.  RESPIRATORY PARAMETERS Diagnostic Total AHI (/hr): 59.4 RDI (/hr): 59.4 OA Index (/hr): 33.2 CA Index (/hr): 0.6 REM AHI (/hr): 68.2 NREM AHI (/hr): 58.3 Supine AHI (/hr): N/A Non-supine AHI (/hr): N/A Min O2 Sat (%): 77.00 Mean O2 (%): 92.11 Time below 88% (min): 11.4     During the titration portion of the study he was started on CPAP 5 cm H2O and increased to 13 cm H2O.  He developed central apneas as he was transitioned to CPAP, and these gradually improved once he was on higher CPAP settings but did not completely resolve.    SLEEP ARCHITECTURE The recording time for the entire night was 431.0 minutes. During a baseline period of 197.5 minutes, the patient slept for 187.7 minutes in REM and nonREM, yielding a sleep efficiency of  95.1%. Sleep onset after lights out was 0.2 minutes with a REM latency of 78.0 minutes. The patient spent 4.53% of the night in stage N1 sleep, 83.75% in stage N2 sleep, 0.00% in stage N3 and 11.72% in REM. During the titration period of 226.9 minutes, the patient slept for 84.7 minutes in REM and nonREM, yielding a sleep efficiency of 37.3%. Sleep onset after CPAP initiation was 15.6 minutes with a REM latency of 174.5 minutes. The patient spent 6.49% of the night in stage N1 sleep, 54.56% in stage N2 sleep, 0.00% in stage N3 and 38.95% in REM.  CARDIAC DATA The 2 lead EKG demonstrated sinus rhythm. The mean heart rate was 76.07 beats per minute. Other EKG findings include: None.  LEG MOVEMENT DATA The total Periodic Limb Movements of Sleep (PLMS) were 8. The PLMS index was 1.76 .  IMPRESSIONS This study showed severe obstructive sleep apnea with an AHI of 59.4 and SaO2 low of 77%.  He did develop central apneas while being titrated on CPAP.  These improved, but not completely resolve, once he was on CPAP 13 cm H2O.  DIAGNOSIS - Complex Sleep Apnea (327.27 [G47.37 ICD-10])  RECOMMENDATIONS I would recommend starting him on auto CPAP with pressure range of 5 to 15 cm H2O.  If he does not adequately adjust to outpatient CPAP therapy, then he might need full night in lab titration study to assess whether he would need BiPAP in place of CPAP.  He was fitted with a Large size [employer] Full Face Mask Simplus mask.    , MD, ABSM Diplomate, American Board of Sleep Medicine 01/12/2016, 3:39 PM  NPI: [id number]  

## 2016-01-14 ENCOUNTER — Ambulatory Visit (HOSPITAL_COMMUNITY): Payer: Commercial Managed Care - HMO | Attending: Cardiology

## 2016-01-14 DIAGNOSIS — E119 Type 2 diabetes mellitus without complications: Secondary | ICD-10-CM | POA: Insufficient documentation

## 2016-01-14 DIAGNOSIS — E785 Hyperlipidemia, unspecified: Secondary | ICD-10-CM | POA: Diagnosis not present

## 2016-01-14 DIAGNOSIS — E669 Obesity, unspecified: Secondary | ICD-10-CM | POA: Diagnosis not present

## 2016-01-14 DIAGNOSIS — I517 Cardiomegaly: Secondary | ICD-10-CM | POA: Diagnosis not present

## 2016-01-14 DIAGNOSIS — Z6834 Body mass index (BMI) 34.0-34.9, adult: Secondary | ICD-10-CM | POA: Diagnosis not present

## 2016-01-14 DIAGNOSIS — I059 Rheumatic mitral valve disease, unspecified: Secondary | ICD-10-CM | POA: Insufficient documentation

## 2016-01-14 DIAGNOSIS — I071 Rheumatic tricuspid insufficiency: Secondary | ICD-10-CM | POA: Insufficient documentation

## 2016-01-14 DIAGNOSIS — I358 Other nonrheumatic aortic valve disorders: Secondary | ICD-10-CM | POA: Diagnosis not present

## 2016-01-14 DIAGNOSIS — Z87891 Personal history of nicotine dependence: Secondary | ICD-10-CM | POA: Diagnosis not present

## 2016-01-14 DIAGNOSIS — I340 Nonrheumatic mitral (valve) insufficiency: Secondary | ICD-10-CM | POA: Insufficient documentation

## 2016-01-15 ENCOUNTER — Other Ambulatory Visit: Payer: Self-pay | Admitting: *Deleted

## 2016-01-15 ENCOUNTER — Encounter: Payer: Self-pay | Admitting: *Deleted

## 2016-01-15 NOTE — Addendum Note (Signed)
Addended by: Raina Mina D on: 01/15/2016 04:50 PM   Modules accepted: Medications

## 2016-01-15 NOTE — Patient Outreach (Addendum)
[name]) Care Management   01/15/2016  Jason [name] [birth year] [id number]  Jason [name] is an 75 y.o. male  Subjective:  COPD: Pt reports he visited her provider and underwent a pulmonary function test which pt has been informed that he is not active with COPD at this time.  Pt able to recite signs and symptoms COPD and what to do if acute symptoms should occur.  Pt able to verified he is in the GREEN zone with no signs or symptoms. Pt verified his will be starting a CPAP after a positive diagnosis awaiting the machine to with all other supplies delivered to pt's home.   DM:  Pt reports he is managing his diabetes and will start eating a healthier diet limits on carbohydrate consumption. Pt indicates he is aware that he needs to lose weight and increase his exercise to better manage his diabetes.  Pt receptive to goals discussed today with all goals met for case closure. Pt declined health coaching for ongoing [facility] services.  Objective:   Review of Systems  Constitutional: Negative.   HENT: Negative.   Eyes: Negative.   Respiratory: Positive for cough.        Allergies  Cardiovascular: Negative.   Gastrointestinal: Negative.   Genitourinary: Negative.   Musculoskeletal: Negative.   Skin: Negative.   Neurological: Negative.   Endo/Heme/Allergies: Negative.   Psychiatric/Behavioral: Negative.     Physical Exam  Constitutional: He is oriented to person, place, and time. He appears well-developed and well-nourished.  HENT:  Right Ear: External ear normal.  Left Ear: External ear normal.  Eyes: EOM are normal.  Neck: Normal range of motion.  Cardiovascular: Normal heart sounds.   Respiratory: Effort normal and breath sounds normal.  GI: Soft. Bowel sounds are normal.  Musculoskeletal: Normal range of motion.  Neurological: He is alert and oriented to person, place, and time.  Skin: Skin is warm and dry.  Psychiatric: He has a normal mood and affect. His  behavior is normal. Judgment and thought content normal.    Current Medications:   Current Outpatient Prescriptions  Medication Sig Dispense Refill  . albuterol (PROVENTIL HFA;VENTOLIN HFA) 108 (90 BASE) MCG/ACT inhaler Inhale 1-2 puffs into the lungs every 6 (six) hours as needed for wheezing or shortness of breath. 3 Inhaler 2  . albuterol (PROVENTIL) (2.5 MG/3ML) 0.083% nebulizer solution Take 3 mLs (2.5 mg total) by nebulization every 4 (four) hours as needed for wheezing. 75 mL 12  . aspirin EC 81 MG tablet Take 1 tablet (81 mg total) by mouth daily. 90 tablet 3  . budesonide-formoterol (SYMBICORT) 80-4.5 MCG/ACT inhaler Inhale 2 puffs into the lungs 2 (two) times daily. 3 Inhaler 3  . glimepiride (AMARYL) 2 MG tablet TAKE 1 TABLET EVERY DAY WITH BREAKFAST 90 tablet 3  . metFORMIN (GLUCOPHAGE) 500 MG tablet TAKE 1 TABLET TWICE DAILY WITH MEALS 180 tablet 1  . simvastatin (ZOCOR) 40 MG tablet TAKE 1 TABLET EVERY EVENING 90 tablet 3  . vitamin C (ASCORBIC ACID) 500 MG tablet Take 1 tablet (500 mg total) by mouth daily. 90 tablet 3   No current facility-administered medications for this visit.    Functional Status:   In your present state of health, do you have any difficulty performing the following activities: 01/15/2016 11/16/2015  Hearing? N N  Vision? N N  Difficulty concentrating or making decisions? N N  Walking or climbing stairs? N Y  Dressing or bathing? N N  Doing errands,  shopping? N N  Preparing Food and eating ? N -  Using the Toilet? N -  In the past six months, have you accidently leaked urine? N -  Do you have problems with loss of bowel control? N -  Managing your Medications? N -  Managing your Finances? N -  Housekeeping or managing your Housekeeping? N -    Fall/Depression Screening:    PHQ 2/9 Scores 01/15/2016 11/21/2015 09/25/2015 09/24/2015 09/24/2015 07/10/2013  PHQ - 2 Score 0 0 - 2 0 4  PHQ- 9 Score - - - 9 - 7  Exception Documentation - - Medical reason - -  -   Filed Vitals:   01/15/16 1600  BP: 122/64  Pulse: 74  Resp: 20    Assessment:  Follow up ongoing case management related to COPD Follow up diabetes management    Plan:  Will completed physical assessment with no acute findings today. Will verified pt with understanding of COPD action plan and remain in the GREEN zone. Verified pt is able to recite some of the signs and symptoms related to COPD and what to do if acute. Verified pt is able to manage his COPD with clearance from his provider as pt continues to use her inhalers and takes his COPD medications at recommended. Will briefly discussed CPAP and usage of this device for increasing his oxygenation.  Will also verify pt continue to manage his diabetes and aware of the affects of elevate CBG if not managed. Will discuss last A1C at 8.5 in January and encouraged healthy eating habits.  Verified pt's CBG (200 ranges) however improving.  Plan of care will e discussed and verified goals have meet met. Will discussed option for health coach however pt feels he is able to manage her medical condition and declined health coaching for ongoing telephonic services. Case will be closed with goals met and pt remains receptive. Pt aware if services are needed in the future or he has an admission [facility] will once again become involved if he remains with the coverage insurance and provider.   Jason Mina, RN Care Management Coordinator [place] Office [phone]

## 2016-01-16 ENCOUNTER — Other Ambulatory Visit: Payer: Self-pay | Admitting: Pulmonary Disease

## 2016-01-16 DIAGNOSIS — G4733 Obstructive sleep apnea (adult) (pediatric): Secondary | ICD-10-CM

## 2016-01-16 DIAGNOSIS — I38 Endocarditis, valve unspecified: Secondary | ICD-10-CM

## 2016-01-16 NOTE — Progress Notes (Signed)
Quick Note:  Referral order placed. Nothing further needed at this time. ______

## 2016-01-18 DIAGNOSIS — J441 Chronic obstructive pulmonary disease with (acute) exacerbation: Secondary | ICD-10-CM | POA: Diagnosis not present

## 2016-01-23 ENCOUNTER — Ambulatory Visit (INDEPENDENT_AMBULATORY_CARE_PROVIDER_SITE_OTHER): Payer: Commercial Managed Care - HMO | Admitting: Cardiology

## 2016-01-23 ENCOUNTER — Encounter: Payer: Self-pay | Admitting: Cardiology

## 2016-01-23 VITALS — BP 120/64 | HR 82 | Ht >= 80 in | Wt 309.5 lb

## 2016-01-23 DIAGNOSIS — I059 Rheumatic mitral valve disease, unspecified: Secondary | ICD-10-CM

## 2016-01-23 NOTE — Patient Instructions (Signed)
Dr Percival Spanish recommends that you continue on your current medications as directed. Please refer to the Current Medication list given to you today.  Dr Percival Spanish recommends that you schedule a follow-up appointment in 1 year. You will receive a reminder letter in the mail two months in advance. If you don't receive a letter, please call our office to schedule the follow-up appointment.  If you need a refill on your cardiac medications before your next appointment, please call your pharmacy.

## 2016-01-23 NOTE — Progress Notes (Signed)
Cardiology Office Note   Date:  01/23/2016   ID:  Jason [name], DOB 1941-02-15, MRN [id number]  PCP:  Scarlette Calico, MD  Cardiologist:   Minus Breeding, MD   Chief Complaint  Patient presents with  . Mitral valve disease     History of Present Illness: Jason [name] is a 75 y.o. male who presents for evaluation of mitral valve disease. He has a history of rheumatic fever at age 71. In 2011 I see an echo that suggested mitral valve prolapse. He does have a murmur and more recently had another echocardiogram. This doesn't suggest prolapse but does suggest mild mitral stenosis. He was referred for follow-up. He actually does quite well. He is active. He has to take care of his mother's uncle and until recently his mother died at 78. He does all the chores of daily living. He denies any chest pressure, neck or arm discomfort. He has no palpitations, presyncope or syncope. He has no shortness of breath, PND or orthopnea. He has had no weight gain or edema.    Past Medical History  Diagnosis Date  . Unspecified venous (peripheral) insufficiency   . Type II or unspecified type diabetes mellitus without mention of complication, not stated as uncontrolled   . Obesity, unspecified   . Esophageal reflux   . Diverticulosis of colon (without mention of hemorrhage)   . Irritable bowel syndrome   . Benign neoplasm of colon   . History of BPH   . History of gynecomastia     Unilateral  . DJD (degenerative joint disease)   . Hidradenitis   . MVP (mitral valve prolapse)   . Depression   . Sleep apnea     does not use  cpap or bipap .. no study  ever  . Asthma     in past, no inhaler now  . Glaucoma   . Hyperlipidemia   . Anxiety   . Memory loss     Past Surgical History  Procedure Laterality Date  . Knee arthroscopy    . Toe amputation  06/2009    x3Right foot second toe -Dr Beola Cord  . Laser ablation  06/2009    Left Greater Saphenous vein -Dr Donnetta Hutching  . Circumcision  06/2100   For Phimosis - Dr Hartley Barefoot  . Laparotomy  07/20/11  . Colon surgery    . Tonsillectomy    . Amputation  08/09/2012    Procedure: AMPUTATION DIGIT;  Surgeon: Newt Minion, MD;  Location: [place];  Service: Orthopedics;  Laterality: Right;  Amputation right Great Toe MTP Joint  . Amputation  10/17/2012    Procedure: AMPUTATION DIGIT;  Surgeon: Newt Minion, MD;  Location: [place];  Service: Orthopedics;  Laterality: Right;  Right Foot 3rd Toe Amputation at Metatarsophalangeal Joint     Current Outpatient Prescriptions  Medication Sig Dispense Refill  . albuterol (PROVENTIL HFA;VENTOLIN HFA) 108 (90 BASE) MCG/ACT inhaler Inhale 1-2 puffs into the lungs every 6 (six) hours as needed for wheezing or shortness of breath. 3 Inhaler 2  . albuterol (PROVENTIL) (2.5 MG/3ML) 0.083% nebulizer solution Take 3 mLs (2.5 mg total) by nebulization every 4 (four) hours as needed for wheezing. 75 mL 12  . aspirin EC 81 MG tablet Take 1 tablet (81 mg total) by mouth daily. 90 tablet 3  . budesonide-formoterol (SYMBICORT) 80-4.5 MCG/ACT inhaler Inhale 2 puffs into the lungs 2 (two) times daily. 3 Inhaler 3  . glimepiride (AMARYL) 2 MG tablet  TAKE 1 TABLET EVERY DAY WITH BREAKFAST 90 tablet 3  . metFORMIN (GLUCOPHAGE) 500 MG tablet TAKE 1 TABLET TWICE DAILY WITH MEALS 180 tablet 1  . simvastatin (ZOCOR) 40 MG tablet TAKE 1 TABLET EVERY EVENING 90 tablet 3  . vitamin C (ASCORBIC ACID) 500 MG tablet Take 1 tablet (500 mg total) by mouth daily. 90 tablet 3   No current facility-administered medications for this visit.    Allergies:   Review of patient's allergies indicates no known allergies.    Social History:  The patient  reports that he quit smoking about 4 months ago. His smoking use included Cigarettes. He has a 2.5 pack-year smoking history. He has never used smokeless tobacco. He reports that he drinks about 4.2 oz of alcohol per week. He reports that he does not use illicit drugs.   Family History:  The  patient's family history includes Cancer in his father; Diabetes in his father; Prostate cancer in his father; Stroke in his father. There is no history of Colon cancer, Esophageal cancer, Rectal cancer, or Stomach cancer.    ROS:  Please see the history of present illness.   Otherwise, review of systems are positive for none.   All other systems are reviewed and negative.    PHYSICAL EXAM: VS:  BP 120/64 mmHg  Pulse 82  Ht 6\' 8"  (2.032 m)  Wt 309 lb 8 oz (140.388 kg)  BMI 34.00 kg/m2 , BMI Body mass index is 34 kg/(m^2). GENERAL:  Well appearing HEENT:  Pupils equal round and reactive, fundi not visualized, oral mucosa unremarkable NECK:  No jugular venous distention, waveform within normal limits, carotid upstroke brisk and symmetric, no bruits, no thyromegaly LYMPHATICS:  No cervical, inguinal adenopathy LUNGS:  Clear to auscultation bilaterally BACK:  No CVA tenderness CHEST:  Unremarkable HEART:  PMI not displaced or sustained,S1 and S2 within normal limits, no S3, no S4, no clicks, no rubs, no murmurs ABD:  Flat, positive bowel sounds normal in frequency in pitch, no bruits, no rebound, no guarding, no midline pulsatile mass, no hepatomegaly, no splenomegaly EXT:  2 plus pulses throughout, no edema, no cyanosis no clubbing SKIN:  No rashes no nodules NEURO:  Cranial nerves II through XII grossly intact, motor grossly intact throughout PSYCH:  Cognitively intact, oriented to person place and time    EKG:  EKG is ordered today. The ekg ordered today demonstrates sinus rhythm, rate 82, axis within normal limits, intervals within normal limits, no acute ST-T wave.   Recent Labs: 09/24/2015: TSH 1.14 11/16/2015: B Natriuretic Peptide 46.1 11/18/2015: ALT 37 12/19/2015: BUN 11; Creatinine, Ser 0.72; Hemoglobin 11.9*; Platelets 189; Potassium 3.9; Sodium 142   Lab Results  Component Value Date   HGBA1C 8.5* 11/25/2015    Lipid Panel    Component Value Date/Time   CHOL 134  09/24/2015 0930   TRIG 69.0 09/24/2015 0930   HDL 41.70 09/24/2015 0930   CHOLHDL 3 09/24/2015 0930   VLDL 13.8 09/24/2015 0930   LDLCALC 78 09/24/2015 0930      Wt Readings from Last 3 Encounters:  01/23/16 309 lb 8 oz (140.388 kg)  01/15/16 311 lb (141.069 kg)  01/09/16 311 lb (141.069 kg)      Other studies Reviewed: Additional studies/ records that were reviewed today include: echo. Review of the above records demonstrates:  Please see elsewhere in the note.     ASSESSMENT AND PLAN:  MS:  The patient does have evidence of mitral stenosis related to  a history of rheumatic fever. This is mild. He's not having any symptoms.  There is some mild mitral regurgitation.  At this point no change in therapy is planned.  DM:  I did review with him that his hemoglobin A1c was 8.5 is better diet control.  OVERWEIGHT:  The patient understands the need to lose weight with diet and exercise. We have discussed specific strategies for this.    Current medicines are reviewed at length with the patient today.  The patient does not have concerns regarding medicines.  The following changes have been made:  no change  Labs/ tests ordered today include:   Orders Placed This Encounter  Procedures  . EKG 12-Lead     Disposition:   FU with me in one year.     Signed, Minus Breeding, MD  01/23/2016 1:11 PM    [place]

## 2016-01-28 DIAGNOSIS — G4733 Obstructive sleep apnea (adult) (pediatric): Secondary | ICD-10-CM | POA: Diagnosis not present

## 2016-01-28 DIAGNOSIS — J441 Chronic obstructive pulmonary disease with (acute) exacerbation: Secondary | ICD-10-CM | POA: Diagnosis not present

## 2016-01-29 ENCOUNTER — Telehealth: Payer: Self-pay | Admitting: Internal Medicine

## 2016-01-29 NOTE — Telephone Encounter (Signed)
Patient requesting test strips for [facility] is [facility] on [address]

## 2016-01-30 MED ORDER — GLUCOSE BLOOD VI STRP: ORAL_STRIP | Status: DC

## 2016-01-30 NOTE — Telephone Encounter (Signed)
Done

## 2016-02-03 ENCOUNTER — Ambulatory Visit: Payer: Commercial Managed Care - HMO | Admitting: Cardiology

## 2016-02-05 ENCOUNTER — Other Ambulatory Visit: Payer: Self-pay

## 2016-02-05 ENCOUNTER — Telehealth: Payer: Self-pay

## 2016-02-05 MED ORDER — GLUCOSE BLOOD VI STRP: ORAL_STRIP | Status: DC

## 2016-02-05 MED ORDER — LANCETS MISC: Status: DC

## 2016-02-05 MED ORDER — BLOOD GLUCOSE MONITOR KIT: PACK | Status: DC

## 2016-02-05 NOTE — Telephone Encounter (Signed)
rx for glucose meter kit and supplies faxed to [employer]

## 2016-02-10 ENCOUNTER — Ambulatory Visit: Payer: Commercial Managed Care - HMO | Admitting: *Deleted

## 2016-02-10 DIAGNOSIS — E1149 Type 2 diabetes mellitus with other diabetic neurological complication: Secondary | ICD-10-CM

## 2016-02-11 NOTE — Progress Notes (Signed)
Patient ID: Jason [name], male   DOB: 10-27-1941, 75 y.o.   MRN: [id number] Patient presents to be scanned and measured for diabetic shoes and inserts.

## 2016-02-12 ENCOUNTER — Encounter: Payer: Self-pay | Admitting: Pulmonary Disease

## 2016-02-12 ENCOUNTER — Telehealth: Payer: Self-pay | Admitting: Internal Medicine

## 2016-02-12 ENCOUNTER — Ambulatory Visit (INDEPENDENT_AMBULATORY_CARE_PROVIDER_SITE_OTHER): Payer: Commercial Managed Care - HMO | Admitting: Pulmonary Disease

## 2016-02-12 VITALS — BP 118/70 | HR 67 | Temp 97.6°F | Ht >= 80 in | Wt 309.6 lb

## 2016-02-12 DIAGNOSIS — I739 Peripheral vascular disease, unspecified: Secondary | ICD-10-CM

## 2016-02-12 DIAGNOSIS — E118 Type 2 diabetes mellitus with unspecified complications: Secondary | ICD-10-CM

## 2016-02-12 DIAGNOSIS — IMO0002 Reserved for concepts with insufficient information to code with codable children: Secondary | ICD-10-CM

## 2016-02-12 DIAGNOSIS — J4530 Mild persistent asthma, uncomplicated: Secondary | ICD-10-CM

## 2016-02-12 DIAGNOSIS — I059 Rheumatic mitral valve disease, unspecified: Secondary | ICD-10-CM | POA: Diagnosis not present

## 2016-02-12 DIAGNOSIS — G4733 Obstructive sleep apnea (adult) (pediatric): Secondary | ICD-10-CM

## 2016-02-12 DIAGNOSIS — E1165 Type 2 diabetes mellitus with hyperglycemia: Secondary | ICD-10-CM

## 2016-02-12 NOTE — Progress Notes (Signed)
Subjective:    Patient ID: Jason [name], male    DOB: 1941-11-15, 75 y.o.   MRN: [id number]  HPI 75 y/o BM here for a follow up visit... he has mult med problems as listed below>> ~  SEE PREV EPIC NOTES FOR OLDER DATA >>   ~  Adm 07/2011 for SBO & required ELap w/ lysis of adhesions by DrBlackman, CCS; post-op problems included ileus, cough, pleural effusion, & minor wound complic managed as outpt & now back to baseline...  LABS 7/13:  Chems- ok x BS=116, A1c=7.1  LABS 1/14:  FLP- at goals on Simva40;  Chems- ok x BS=146, A1c=7.2;  TSH=1.19, PSA=0.38   ~  July 10, 2013:  58moROV & Jason [name] has a rash on his arm- pruritic, was working in his yard, & c/w mild poison ivy- improved w/ Calamine & OTC meds... We reviewed the following medical problems during today's office visit >>`    MVP>  On ASA daily & 2DEcho 10/11 showed mild LVH, norm LVF w/ EF= 55-60%, ant leaflet MVP seen w/ MR, mod LAdil; he denies CP, palpit, SOB, edema...    Ven Insuffic>  Hx VV, VI, stasis changes w/ ulcer/ cellulitis in past; s/p laser to left GSV by DrEarly & s/p right 2nd toe amp for osteomyelitis by DrBednarz 2010.    Chol>  On Simva40 and FLP 1/14 shows TChol 136, TG 109, HDL 44, LDL 71... keep same dose + better diet & get wt down!    DM>  On Metformin 500Bid & Glimep'1mg'$ Qam; BS at home are improved & Labs here 8/14 showed BS= 74, A1c= 6.4; encouraged to lose wt...    Obesity>  weight is down to 314# today; we reviewed the critical need for wt reducting diet + exercise program...    GI>  Adm 9/12 w/ SBO & had ELap w/ lysis of adhesions;  Now back to baseline w/o abd pain, N/V/D/C/ or blood seen...    GU>  He saw DrTannenbaum et al 3/12 (we don't have more recent notes) for f/u BPH, ED, & PSA recheck;  Voiding satis, given Cialis & PSA= 0.47 (he had 58cc PVR- not on meds at present); PSA checks here have been stable at 0.38...    DJD/ LBP>  Hx LBP w/ walking, no radiation;  Hx right foot problems w/ amp 1st 3 toes  now for osteomyelitis ([facility], [place]...    Anxiety>  He related difficulty caring for family esp elderly mother who is quite demanding & conflict w/ his daughter/ her 2 kids/ etc... We reviewed prob list, meds, xrays and labs> see below for updates >>   LABS 8/14:  FLP- ok w/ BS=74, A1c=6.4, Cr=1.4..Marland Kitchen   ~  October 08, 2015:      89yrOV & asked to check pt by DrTJones due to cough> Jason [name] has only a minimal smoking hx- started in his late teens, smoked for only 12 yrs up to 1/2 ppd at the max, and quit in 1973 (age31); he worked at [employer] 1971 to 2005 & was exposed to 2nd hand smoke;  He has had an occas bronchitic infection, and one bout of pneumonia in 2009, but no recurrent infections, no hx TB or other toxic exposures (no known asbestos etc);  He tells me that he has been under a lot of stress- caring for wife, elderly uncle who lives w/ them, and his 9148/o mother w/ cancer now on hospice;  Due to the stress he  started smoking again 06/2014 up to 1ppd but had his last cigarette ~2wks ago- says he has quit for good now;  He is c/o cough, mild chest congestion, small amt beige sputum w/o hemoptysis; he notes occas wheezing but denies SOB, CP, palpit, ch in edema- he is still able to mow yard, walking, does chores/ shopping etc w/o giving out. W/o SOB, etc;  He notes that mucinex helps the congestion...    EXAM shows Afeb, VSS, O2sat=97% on RA;  HEENT- neg, mallampati2;  Chest- clear, sl decr BS at bases, congested cough, w/o w/r/r;  Heart- RR gr1/6SEM w/o r/g;  Abd- obese, soft, neg;  Ext- +VI otherw neg w/o c/c/e...   Last CXR 09/24/15 showed norm heart size, linear atx vs scarring at left base, otherw clear, NAD...   Spirometry 10/08/15 showed FVC=3.51 (69%), FEV1=2.68 (71%), %1sec=76%, mid-flows reduced at 64% predicted... This is suggestive of mild restrictive ventilatory defect w/ superimposed mild small airways dis...   Ambulatory oxygen saturation test 10/08/15> O2sat=97%  on RA at rest w/ pulse=72/min; he ambulated 3 laps in the office w/ lowest O2sat=92% w/ pulse=99/min...   LABS 09/2015>  Chems- wnl x BS=149, A1c=6.3;  CBC- wnl w/ Hg=14.3, WBC=5.4 & 8%eos;  TSH=1.14;  PSA=0.44 IMP/PLAN>>  Jason [name] has a min smoking hx but restarted ~80yrago, then quit again 2 wks ago; c/o cough & congestion w/ some intermittent wheezing c/w chr bronchitis; his Spirometry show some mild restriction suspected and superimposed small airways dis;  Now that he has quit smoking he does not want to get on a regular inhaler if not absolutely needed, so we decided to treat w/ PROAIR-HFA 1-2 sp Q6H prn + MUCINEX 6031m 1-2Bid w/ fluids;  If he does not improve rapidly/ resolve- then we could prescribe an ICS/LABA or use parenteral therapy if needed... We discussed an ROV in 2-14m5moooner if needed.  ~  January 01, 2016:  14mo51mo w/ SN>   Since Jason [name] was here last he has had 2 Hospitalizations:    1st Hosp was 12/21 - 11/07/15 by [facility]>  He had just been to the ER w/ bronchitis & treated w/ ZPak & Tussionex;  Presented w/ 2wk hx cough, thick yellow sput, wheezing, SOB, chest tightness;  Found to have CAP-nos, CXR showed mild cardiomeg, chr LLL scarring & superimposed LLL infiltrate; Flu panel- neg, CBC was wnl w/ 7K WBC; Chems- wnl x BS17-220 range;  Treated w/ Levaquin, NEBS, & Solumedrol=> Pred taper...    2nd Hosp was 1/1 - 11/19/15 by [facility]>  Presented to Er w/ recurrent wheezing, chest congestion, SOB & Adm w/ a "COPD exac" & O2sat said to be in the mid-80's on RA w/ ambulation in ER; No cultures done; CXR showed stable left basilar scarring, NAD;  LABS showed elev WBC=7K on Adm 7 incr to 16K w/ Solumedrol rx, Chems- wnl x BS up to 400 w/ steroids, BNP=46, A1c=8.5;  He was treated w/ more Levaquin, Solumedrol=> Pred taper, NEBS, Mucinex, etc...    Since disch he has followed up w/ DrJones x2, [facility], & went back to the ER 12/22/15 w/ hyperglycemia;  We reviewed the following medical problems during  today's office visit >>     Restrictive lung disease> due to obesity, chest wall factors, & anxiety; we reviewed diet, exercise, wt reduction strategies + offered anxiolytic rx but he declines...     Hx asthmatic bronchitis/ pneumonia> on Symbicort 2spQd, NEBS w/ Albut vs HFA inhaler prn; notes sl cough, denies dyspnea;  rec to incr Symbicort80-2spBid & use the NEBS Bid for now...    Ex-smoker> he has a min smoking hx ~10 pack-yrs total & quit in 0947; he inexplicably took it up again in 2016 but then quit again later that yr...     R/O OSA>  Pt says that his wife notes apneas while he sleeps but not c/o snoring; he has very poor sleep hygiene due to elderly uncle living w/ them; he rests poorly he says but often wakes refreshed; does note sleep pressure during the day & naps 3-4days per wk; his ESS= 18/24 and we will order a split night sleep test...     MVP>  On ASA daily & 2DEcho 10/11 showed mild LVH, norm LVF w/ EF= 55-60%, ant leaflet MVP seen w/ MR, mod LAdil; he denies CP, palpit, SOB, ch in edema...    Ven Insuffic>  Hx VV, VI, stasis changes w/ ulcer/ cellulitis in past; s/p laser to left GSV by DrEarly & s/p right 2nd toe amp for osteomyelitis by DrBednarz 2010.    Chol>  On Simva40 and FLP 11/16 shows TChol 134, TG 69, HDL 42, LDL 78... keep same dose + better diet & get wt down!    DM>  On Metformin 500Bid & Glimep24mQam; BS at home are improved & Labs here 1/17 showed BS= 150-300, A1c= 8.5; encouraged to get on diet, get wt down, f/u w/ PCP...    Obesity>  weight is down to 311# today; we reviewed the critical need for wt reducting diet + exercise program...    GI>  Adm 9/12 w/ SBO & had ELap w/ lysis of adhesions;  Now back to baseline w/o abd pain, N/V/D/C/ or blood seen...    GU>  He saw DrTannenbaum et al 3/12 (we don't have more recent notes) for f/u BPH, ED, & PSA recheck;  Voiding satis, prev on Cialis & PSAs have been good...    DJD/ LBP>  Hx LBP w/ walking, no radiation;  Hx right  foot problems w/ amp 1st 3 toes now for osteomyelitis ([facility], [place]...    Anxiety>  He related difficulty caring for family esp elderly mother who is quite demanding, then an uncle, & conflict w/ his daughter/ her 2 kids/ etc... EXAM shows Afeb, VSS, O2sat=97% on RA;  HEENT- neg, mallamapti1;  Chest decr BS, clear, Gr2/6sys murmur w/o r/g;  Abd- obeses soft, neg;  Ext- VI, stasis changes, chr edema bilat; Neuro- neuropathy w/o focal motor abd...  CXR 11/2015 showed mild cardiomeg, chr LLL scarring & superimposed LLL infiltrate=> improved   Spirometry 01/01/16> FVC=3.76 (74%), FEV1=2.84 (75%), %1sec=76, mid-flows sl reduced at 64% predicted;  This is c/w mild restrictive ventilatory defect & superimposed small airways dis...  Ambulatory oximetry 01/01/16> O2sat=97% on RA at rest w/ pulse=72/min;  He ambulated 3 laps in office w/ lowest O2sat=90% w/ heart rate 100/min IMP/PLAN>>  As above- he has a min smoking hx and evid of Restrictive lung dis at baseline; recent bronchitis=> [place] inflamm [place] getting back to baseline 7 rec that he use the Symbicort80-2spBid for the airway inflamm & to keep him off Pred;  OK to use NEBS, rescue inhaler as needed;  We will order a split night sleep test & a f/u 2DEcho...  ADDENDUM> Sleep Study 01/09/16> severe OSA w/ AHI=59.4/hr, REM AHI=68.2/hr, Min O2sat=77%, Mean=92%, Time <88%=11.456m, NSR- no arrhythmias, no signif PLMS;  Split night study showed good control on CPAP=13 (few central  apneas noted);  REC> large [employer] full face Simplus mask, Res Med S10 machine Air/Auto w/ heated humidity, climate controlled tubing, Auto CPAP 5-15, enroll in AirView;  He needs ROV w/ download in 6-8 weeks... ADDENDUM> 2DEcho 01/14/16> mild conc LVH, norm sys function & no regional wall motion abnormalities, AoV mod thickened & calcif leaflets w/ restricted mobility, MV leaflets mod thicked & calcif w/ reduced separation c/w mild-mod  stenosis & mild MR, mod LA dil (15m), RV norm w/ norm sys function/ RA normal/ PAsys wnl; we will refer to [facility]...   ~  February 12, 2016:  6wk ROV & JMardellreports that his breathing is improved on SYMBICORT160-2spBid & Albut via NEB or HFA prn;  In addition he had his sleep study 01/09/16 w/ severe OSA as above & we set him up w/ CPAP Auto 5-15 and he has been using it for the last 2wks now w/ download indicating that he's using it ~8H/night w/ good control & AHI down to 5/hr;  States he's resting OK but still wakes tired, no change in daytime alertness, still naps on occas, he fells he's improving...     He saw [facility], DrHochrein, 01/23/16> Miral valve dis (mild MS) and hx rheumatic fever as a child; he is relatively asymptomatic- no change in meds, encouraged diet, exercise, wt reduction; f/u planned 118yr. EXAM shows Afeb, VSS, O2sat=97% on RA;  HEENT- neg, mallamapti1;  Chest decr BS, clear, Gr2/6sys murmur w/o r/g;  Abd- obeses soft, neg;  Ext- VI, stasis changes, chr edema bilat; Neuro- neuropathy w/o focal motor abd... IMP/PLAN>>  We discussed his situation as the care giver for his family (elderly great uncle, and his wife's care needs);  Continue Symbicort, albuterol rescue prn, CPAP nightly, plus his Simva40/ Metform/ Glimep...  We plan ROV 58m39mo a full download of CPAP data...            Problem List:     His PCP is DrTJones, [place]...   Obstructive Sleep Apnea >>  ~  See Sleep Study 01/09/16 w/ AHI=59 and split night test showed good control on CPAP=13;  started on CPAP auto 5-15 and tolerating well so far...  AB/ Pulmonary Restriction/ Hx Pneumonia >> seen in ER 7/09 w/ faint RLL infiltrate that cleared serially w/ antibiotic Rx... ~  CXR 9/13 showed norm heart size, clear lungs w/ mild left basilar scarring & mild degen changes in Tspine... ~  [facility] ?[facility] infiltr[facility]/ scarring; treated w/ Levaquin, Pred, Mucinex, etc...  ~  AB resolved and stable on SYMBICORT 160-2spbid + prn  albuterol NEBS or MDI...  MITRAL VALVE DISEASE >> on ASA '81mg'$ /d... he denies CP, palpit, dyspnea... exam w/ MR murmur... ~  2DEcho 5/01 showed mild ant leaflet MVP, mild MR, norm LVF. ~  f/u 2DEcho 10/11 showed mild LVH, norm LVF w/ EF= 55-60%, ant leaflet MVP seen w/ MR, mod LAdil ~  EKG 9/12 showed NSR, rate80, NSSTTWA ~  EKG 9/13 showed NSR, rate 79, NSSTTWA... ~  EKG 11/2015 showed NSR w/ PACs, rate71, 1st degree AVB, incr voltage (r/o LVH), otherw wnl... ~  2DEcho 01/14/16 showed mild conc LVH & mild MS, he has hx rheumatic fever at age 78..78 ~  [facility] eval by DrHochrein 01/2016> mild MS, he is asymptomatic & they plan f/u 49yr54yr VENOUS INSUFFICIENCY (ICD-459.81) - he knows to elim salt, elevate legs, wear TED's... ~  neg ven dopplers in 1996...  ~  he saw DrLawson in 2000 for a  ven stasis ulcer on his leg... rec for TED's, no salt, elevation, etc...  ~  he had normal ABI's in 10/06... ~  3/10:  left leg wound w/ cellulitis & +MRSA... Rx'd and refer back to wound care clinic. ~  7/10:  sched for right second toe amputation due to osteomyelitis despite hyperbaric O2 etc... ~  8/10: s/p right second toe amp for osteomyelitis [facility]), and s/p laser ablation of left greater saphenous vein (DrEarly). ~  7/13:  He tells me that DrTuckman, Podiatrist has been treating his right great toe (ulcer on plantar surface)- we will call for notes. ~  He subsequently had 2 more toes amputated by DrDuda; subseq well healed & doing satis... ~  Chronic VI & stasis changes w/ chr edema...  HYPERCHOLESTEROLEMIA (ICD-272.0) - on SIMVASTATIN '40mg'$  Qhs...  DIABETES MELLITUS (ICD-250.00) - on METFORMIN '500mg'$  Bid + GLIMEPIRIDE '2mg'$ /d, and diet efforts...  OBESITY (ICD-278.00) - he is very large at '6\' 8"'$  tall and >300# for the last 10+ yrs... peak = 340#... he drinks beer daily... discussed diet + exercise program (and no beer)... ~  he had transient right gynecomastia 9/09, no discharge, neg mammogram & resolved on  it's own...  GERD (ICD-530.81) >> not currently taking a PPI med...  DIVERTICULOSIS OF COLON (ICD-562.10) / IRRITABLE BOWEL SYNDROME (ICD-564.1) & COLONIC POLYPS (ICD-211.3) ~  colonoscopy 3/06 by DrStark showing divertics & 2-80m polyps (adenomas)... ~  colonoscopy 9/11 by DrStark showed divertics & 461mpolyp= tubular adenoma... f/u planned 5y11yr~  colonoscopy 7/16 by DrStark showed mild diertics, int hems, 5 polyps 4-6mm60mze & Bx= tubular adenomas and one serrated polyp, no atypia...  BENIGN PROSTATIC HYPERTROPHY & ED - eval by DrTannenbaum 7/08 and Rx w/ Cialis; he has tried the PEP shots; + fam hx prostate ca in his father... ~  8/10:  DrTannenbaum did circ for phimosis, pt states that PSA check was OK... ~  3/12:  f/u BPH, ED, & PSA recheck;  Voiding satis, given Cialis & PSA= 0.47 (he had 58cc PVR- not on meds at present).  DEGENERATIVE JOINT DISEASE (ICD-715.90) - he uses OTC meds Prn, and was rec to start Vit supplements by DrTannenbaum- takes MVI, VitC, VitD... he has seen DrHilts in the past... ~  He has had the 1st 3 toes of right foot amputated...  Hx of HIDRADENITIS (ICD-705.83)  ANXIETY>  He related difficulty caring for family esp elderly mother who is quite demanding & conflict w/ his daughter/ her 2 kids/ etc...  Health Maintenance - up to date on colon & prostate screens... PNEUMOVAX 9/09... TETANUS booster 3/10... gets yearly Flu vaccines in Fall.   Past Surgical History  Procedure Laterality Date  . Knee arthroscopy    . Toe amputation  06/2009    x3Right foot second toe -Dr BednBeola CordLaser ablation  06/2009    Left Greater Saphenous vein -Dr EarlDonnetta HutchingCircumcision  06/2100    For Phimosis - Dr TannHartley BarefootLaparotomy  07/20/11  . Colon surgery    . Tonsillectomy    . Amputation  08/09/2012    Procedure: AMPUTATION DIGIT;  Surgeon: MarcNewt Minion;  [facility] [place] [place];  Laterality: Right;  Amputation right Great Toe MTP Joint  . Amputation   10/17/2012    Procedure: AMPUTATION DIGIT;  Surgeon: MarcNewt Minion;  [facility] [place];  Laterality: Right;  Right Foot 3rd Toe Amputation at Metatarsophalangeal Joint  Outpatient Encounter Prescriptions as of 02/12/2016  Medication Sig  . albuterol (PROVENTIL HFA;VENTOLIN HFA) 108 (90 BASE) MCG/ACT inhaler Inhale 1-2 puffs into the lungs every 6 (six) hours as needed for wheezing or shortness of breath.  Marland Kitchen albuterol (PROVENTIL) (2.5 MG/3ML) 0.083% nebulizer solution Take 3 mLs (2.5 mg total) by nebulization every 4 (four) hours as needed for wheezing.  Marland Kitchen aspirin EC 81 MG tablet Take 1 tablet (81 mg total) by mouth daily.  . blood glucose meter kit and supplies KIT Use to test blood sugar up to 3 times daily. DX E11.9  . budesonide-formoterol (SYMBICORT) 80-4.5 MCG/ACT inhaler Inhale 2 puffs into the lungs 2 (two) times daily.  Marland Kitchen glimepiride (AMARYL) 2 MG tablet TAKE 1 TABLET EVERY DAY WITH BREAKFAST  . glucose blood (COOL BLOOD GLUCOSE TEST STRIPS) test strip Use to test blood sugar up to 3 times daily. DX E11.9  . Lancets MISC Use to test blood sugar up to 3 times daily. DX E11.9  . metFORMIN (GLUCOPHAGE) 500 MG tablet TAKE 1 TABLET TWICE DAILY WITH MEALS  . simvastatin (ZOCOR) 40 MG tablet TAKE 1 TABLET EVERY EVENING  . vitamin C (ASCORBIC ACID) 500 MG tablet Take 1 tablet (500 mg total) by mouth daily.   No facility-administered encounter medications on file as of 02/12/2016.    No Known Allergies   Current Medications, Allergies, Past Medical History, Past Surgical History, Family History, and Social History were reviewed in [employer] record.    Review of Systems         See HPI - all other systems neg except as noted> c/o recent cough, chest congestion The patient denies anorexia, fever, weight loss, weight gain, vision loss, decreased hearing, hoarseness, chest pain, syncope, dyspnea on exertion, peripheral edema, headaches,  hemoptysis, abdominal pain, melena, hematochezia, severe indigestion/heartburn, hematuria, incontinence, muscle weakness, suspicious skin lesions, transient blindness, difficulty walking, depression, unusual weight change, abnormal bleeding, enlarged lymph nodes, and angioedema.     Objective:   Physical Exam     WD, Overweight, very large 75 y/o BM in NAD... GENERAL:  Alert & oriented; pleasant & cooperative. HEENT:  [place]/AT, EOM-wnl, PERRLA, EACs-clear, TMs-wnl, NOSE-clear, THROAT-clear & wnl (mallamapti2) NECK:  Supple w/ fairROM; no JVD; normal carotid impulses w/o bruits; no thyromegaly or nodules palpated; no lymphadenopathy. CHEST:  Clear to P & A; without wheezes/ rales/ or rhonchi heard, no consolidation... HEART:  Regular Rhythm; gr 1-2/6 SEM without rubs or gallops detected... ABDOMEN:  Soft & nontender; normal bowel sounds; no organomegaly or masses detected. EXT: without deformities, mod arthritic changes; wearing TEDs +varicose veins/ +venous insuffic/ chr edema... NEURO:  CN's intact;  no focal neuro deficits... Derm:  Mild stasis changes in LEs w/o cellulitis etc...  RADIOLOGY DATA:  Reviewed in the EPIC EMR & discussed w/ the patient...  LABORATORY DATA:  Reviewed in the EPIC EMR & discussed w/ the patient...   Assessment & Plan:       1) Restrictive lung disease> due to obesity, chest wall factors, & anxiety; we reviewed diet, exercise, wt reduction strategies + offered anxiolytic rx but he declines.Marland KitchenMarland Kitchen     2) Hx asthmatic bronchitis/ pneumonia> on Symbicort 2spQd, NEBS w/ Albut vs HFA inhaler prn; notes sl cough, denies dyspnea; rec to incr Symbicort80-2spBid & use the NEBS Bid for now...    3) Ex-smoker> he has a min smoking hx ~10 pack-yrs total & quit in 1973; he inexplicably took it up again in 2016 but then quit  again later that yr...     4) R/O OSA>  Pt says that his wife notes apneas while he sleeps but not c/o snoring; he has very poor sleep hygiene due to elderly  uncle living w/ them; he rests poorly he says but often wakes refreshed; does note sleep pressure during the day & naps 3-4days per wk; his ESS= 18/24 and we will order a split night sleep test...   MEDICAL ISSUES per DrTJones>>     HxMVP=> 2DEcho w/ mild MS>  Mild MVP on 2DEcho 2011 & he denies CP, palpit, ch in SOB, edema, etc; repeat Echo 2017 w/ mild MS & he was seen by DrHochrein for [facility].    Ven Insuffic>  He had vein surg from DrEarly; knows to elim sodium, elevate legs, wear support hose, etc...    Chol>  On Simva40 + diet;  FLP looks good...    DM>  Controlled on Metformin & Glimepiride; he needs better diet, incr exercise, get wt down...       Diabetic foot ulcer> he lost right 2nd toe to osteomyelitis in 2010; now w/ ulcer on plantar aspect great toe> under the care of Podiatry=> DrDuda had to amputate 2 toes 9/13...    Obesity>  As noted, wt down to 310# after his hosp for SBO; then right back up to 329# after the holidays...    GI> s/p SBO w/ surg, Hx GERD/ Divertics/ IBS/ Polyps> GI per DrStark & post-op DrBlackman...    GU> BPH & ED followed by DrTannenbaum...    DJD> on OTC analgesics as needed...    ANXIETY>  We discussed coping strategies for the family stress...  PLAN>>   01/01/16>  As above- he has a min smoking hx and evid of Restrictive lung dis at baseline; recent bronchitis=> airway inflamm & pneumonia (nos) treated in [place]; now getting back to baseline 7 rec that he use the Symbicort80-2spBid for the airway inflamm & to keep him off Pred;  OK to use NEBS, rescue inhaler as needed;  We will order a split night sleep test & a f/u 2DEcho... 3/30>  We discussed his situation as the care giver for his family (elderly great uncle, and his wife's care needs);  Continue Symbicort, albuterol rescue prn, CPAP nightly, plus his Simva40/ Metform/ Glimep...  We plan ROV 92mow/ a full download of CPAP data   Patient's Medications  New Prescriptions   No medications on file  Previous  Medications   ALBUTEROL (PROVENTIL HFA;VENTOLIN HFA) 108 (90 BASE) MCG/ACT INHALER    Inhale 1-2 puffs into the lungs every 6 (six) hours as needed for wheezing or shortness of breath.   ALBUTEROL (PROVENTIL) (2.5 MG/3ML) 0.083% NEBULIZER SOLUTION    Take 3 mLs (2.5 mg total) by nebulization every 4 (four) hours as needed for wheezing.   ASPIRIN EC 81 MG TABLET    Take 1 tablet (81 mg total) by mouth daily.   BLOOD GLUCOSE METER KIT AND SUPPLIES KIT    Use to test blood sugar up to 3 times daily. DX E11.9   BUDESONIDE-FORMOTEROL (SYMBICORT) 80-4.5 MCG/ACT INHALER    Inhale 2 puffs into the lungs 2 (two) times daily.   GLIMEPIRIDE (AMARYL) 2 MG TABLET    TAKE 1 TABLET EVERY DAY WITH BREAKFAST   GLUCOSE BLOOD (COOL BLOOD GLUCOSE TEST STRIPS) TEST STRIP    Use to test blood sugar up to 3 times daily. DX E11.9   LANCETS MISC    Use to  test blood sugar up to 3 times daily. DX E11.9   METFORMIN (GLUCOPHAGE) 500 MG TABLET    TAKE 1 TABLET TWICE DAILY WITH MEALS   SIMVASTATIN (ZOCOR) 40 MG TABLET    TAKE 1 TABLET EVERY EVENING   VITAMIN C (ASCORBIC ACID) 500 MG TABLET    Take 1 tablet (500 mg total) by mouth daily.  Modified Medications   No medications on file  Discontinued Medications   No medications on file

## 2016-02-12 NOTE — Telephone Encounter (Signed)
Pt called back to let you know the test strips he has is True Metrix and the lancets are True Plus by Theora Gianotti

## 2016-02-12 NOTE — Patient Instructions (Signed)
Today we updated your med list in our EPIC system...    Continue your current medications the same...  Continue to use your CPAP nightly and call for any trouble...  Let's plan a follow up visit in 36mo, sooner if needed for problems.Marland KitchenMarland Kitchen

## 2016-02-13 MED ORDER — TRUE METRIX METER W/DEVICE KIT: PACK | Status: DC

## 2016-02-13 NOTE — Telephone Encounter (Signed)
Kit sent. Pt would like to be informed when you have some symbicort samples in

## 2016-02-18 DIAGNOSIS — J441 Chronic obstructive pulmonary disease with (acute) exacerbation: Secondary | ICD-10-CM | POA: Diagnosis not present

## 2016-02-25 ENCOUNTER — Encounter (HOSPITAL_BASED_OUTPATIENT_CLINIC_OR_DEPARTMENT_OTHER): Payer: Commercial Managed Care - HMO

## 2016-02-28 DIAGNOSIS — J441 Chronic obstructive pulmonary disease with (acute) exacerbation: Secondary | ICD-10-CM | POA: Diagnosis not present

## 2016-02-28 DIAGNOSIS — G4733 Obstructive sleep apnea (adult) (pediatric): Secondary | ICD-10-CM | POA: Diagnosis not present

## 2016-03-03 ENCOUNTER — Other Ambulatory Visit: Payer: Self-pay | Admitting: Internal Medicine

## 2016-03-03 DIAGNOSIS — J4540 Moderate persistent asthma, uncomplicated: Secondary | ICD-10-CM

## 2016-03-03 MED ORDER — FLUTICASONE FUROATE-VILANTEROL 200-25 MCG/INH IN AEPB: 1.0000 | INHALATION_SPRAY | Freq: Every day | RESPIRATORY_TRACT | Status: DC

## 2016-03-03 NOTE — Telephone Encounter (Signed)
Change to breo One puff each day Samples and free card in bag Rx sent to pharmacy

## 2016-03-03 NOTE — Telephone Encounter (Signed)
Symbicort is not in the office yet. Pt is open for a sample of what we have in the office and he will come in and pick it up today.

## 2016-03-19 DIAGNOSIS — J441 Chronic obstructive pulmonary disease with (acute) exacerbation: Secondary | ICD-10-CM | POA: Diagnosis not present

## 2016-03-29 DIAGNOSIS — G4733 Obstructive sleep apnea (adult) (pediatric): Secondary | ICD-10-CM | POA: Diagnosis not present

## 2016-03-29 DIAGNOSIS — J441 Chronic obstructive pulmonary disease with (acute) exacerbation: Secondary | ICD-10-CM | POA: Diagnosis not present

## 2016-03-31 ENCOUNTER — Ambulatory Visit (INDEPENDENT_AMBULATORY_CARE_PROVIDER_SITE_OTHER): Payer: Commercial Managed Care - HMO | Admitting: Podiatry

## 2016-03-31 ENCOUNTER — Encounter: Payer: Self-pay | Admitting: Podiatry

## 2016-03-31 DIAGNOSIS — M2041 Other hammer toe(s) (acquired), right foot: Secondary | ICD-10-CM

## 2016-03-31 DIAGNOSIS — E1149 Type 2 diabetes mellitus with other diabetic neurological complication: Secondary | ICD-10-CM

## 2016-03-31 DIAGNOSIS — B351 Tinea unguium: Secondary | ICD-10-CM

## 2016-03-31 DIAGNOSIS — M79675 Pain in left toe(s): Secondary | ICD-10-CM | POA: Diagnosis not present

## 2016-03-31 DIAGNOSIS — L84 Corns and callosities: Secondary | ICD-10-CM

## 2016-03-31 DIAGNOSIS — M2042 Other hammer toe(s) (acquired), left foot: Secondary | ICD-10-CM | POA: Diagnosis not present

## 2016-03-31 DIAGNOSIS — M2040 Other hammer toe(s) (acquired), unspecified foot: Secondary | ICD-10-CM

## 2016-03-31 DIAGNOSIS — M79674 Pain in right toe(s): Secondary | ICD-10-CM

## 2016-03-31 DIAGNOSIS — Z89421 Acquired absence of other right toe(s): Secondary | ICD-10-CM

## 2016-03-31 NOTE — Patient Instructions (Signed)

## 2016-03-31 NOTE — Progress Notes (Signed)
Patient ID: Jason [name], male   DOB: 12-Mar-1941, 75 y.o.   MRN: [id number]    Subjective: This patient presents today complaining of thickened and brittle toenails on the right and left feet and request toenail debridement. Also, patient presents to pick up diabetic shoes with custom insoles  Objective: Vascular: DP and PT pulses palpable bilaterally Capillary refill fill media bilaterally  Neurological: Sensation to 10 g monofilament wire diminished bilaterally  Dermatological: No open skin lesions bilaterally Large plantar callus third fourth MPJ right Toenails 1-5 left and 4-5 right are brittle, thickened deformed and tender to direct palpation  Musculoskeletal: Amputated first through third toes right with well-healed incision Hammer toes  Assessment: History amputation right foot Hammertoe Mycotic toenails Patent toes associated with mycotic toenails Pre-ulcerative callus Satisfactory fit of diabetic shoes 2 and custom insoles 6  Plan: Debridement toenails 7 mechanical a and electrically without any bleeding  Debride plantar callus right without any bleeding  Dispensed diabetic shoes, shoes are tried on for good fit.  Patient received 1 Pair Apex X521M Boss Runner Blue in men's size 15 extra wide and 3 pairs custom molded diabetic inserts with toe filler for Right 1st, 2nd & 3rd toes.  Verbal and written break in and wear instructions given.  Patient will follow up for scheduled routine care          .

## 2016-04-07 ENCOUNTER — Encounter: Payer: Self-pay | Admitting: Pulmonary Disease

## 2016-04-13 ENCOUNTER — Ambulatory Visit (INDEPENDENT_AMBULATORY_CARE_PROVIDER_SITE_OTHER): Payer: Commercial Managed Care - HMO | Admitting: Pulmonary Disease

## 2016-04-13 ENCOUNTER — Encounter: Payer: Self-pay | Admitting: Pulmonary Disease

## 2016-04-13 VITALS — BP 128/66 | HR 61 | Temp 98.4°F | Ht >= 80 in | Wt 312.0 lb

## 2016-04-13 DIAGNOSIS — J4530 Mild persistent asthma, uncomplicated: Secondary | ICD-10-CM | POA: Diagnosis not present

## 2016-04-13 DIAGNOSIS — I739 Peripheral vascular disease, unspecified: Secondary | ICD-10-CM | POA: Diagnosis not present

## 2016-04-13 DIAGNOSIS — E1165 Type 2 diabetes mellitus with hyperglycemia: Secondary | ICD-10-CM

## 2016-04-13 DIAGNOSIS — E118 Type 2 diabetes mellitus with unspecified complications: Secondary | ICD-10-CM

## 2016-04-13 DIAGNOSIS — I059 Rheumatic mitral valve disease, unspecified: Secondary | ICD-10-CM | POA: Diagnosis not present

## 2016-04-13 DIAGNOSIS — IMO0002 Reserved for concepts with insufficient information to code with codable children: Secondary | ICD-10-CM

## 2016-04-13 DIAGNOSIS — G4733 Obstructive sleep apnea (adult) (pediatric): Secondary | ICD-10-CM | POA: Diagnosis not present

## 2016-04-13 NOTE — Progress Notes (Signed)
Subjective:    Patient ID: Jason [name], male    DOB: Aug 24, 1941, 75 y.o.   MRN: [id number]  HPI 75 y/o BM here for a follow up visit... he has mult med problems as listed below>> ~  SEE PREV EPIC NOTES FOR OLDER DATA >>   ~  Adm 07/2011 for SBO & required ELap w/ lysis of adhesions by DrBlackman, CCS; post-op problems included ileus, cough, pleural effusion, & minor wound complic managed as outpt & now back to baseline...  LABS 7/13:  Chems- ok x BS=116, A1c=7.1  LABS 1/14:  FLP- at goals on Simva40;  Chems- ok x BS=146, A1c=7.2;  TSH=1.19, PSA=0.38   ~  July 10, 2013:  72moROV & Jason [name] has a rash on his arm- pruritic, was working in his yard, & c/w mild poison ivy- improved w/ Calamine & OTC meds... We reviewed the following medical problems during today's office visit >>`    MVP>  On ASA daily & 2DEcho 10/11 showed mild LVH, norm LVF w/ EF= 55-60%, ant leaflet MVP seen w/ MR, mod LAdil; he denies CP, palpit, SOB, edema...    Ven Insuffic>  Hx VV, VI, stasis changes w/ ulcer/ cellulitis in past; s/p laser to left GSV by DrEarly & s/p right 2nd toe amp for osteomyelitis by DrBednarz 2010.    Chol>  On Simva40 and FLP 1/14 shows TChol 136, TG 109, HDL 44, LDL 71... keep same dose + better diet & get wt down!    DM>  On Metformin 500Bid & Glimep188mam; BS at home are improved & Labs here 8/14 showed BS= 74, A1c= 6.4; encouraged to lose wt...    Obesity>  weight is down to 314# today; we reviewed the critical need for wt reducting diet + exercise program...    GI>  Adm 9/12 w/ SBO & had ELap w/ lysis of adhesions;  Now back to baseline w/o abd pain, N/V/D/C/ or blood seen...    GU>  He saw DrTannenbaum et al 3/12 (we don't have more recent notes) for f/u BPH, ED, & PSA recheck;  Voiding satis, given Cialis & PSA= 0.47 (he had 58cc PVR- not on meds at present); PSA checks here have been stable at 0.38...    DJD/ LBP>  Hx LBP w/ walking, no radiation;  Hx right foot problems w/ amp 1st 3 toes  now for osteomyelitis ([facility] Cord[facility]iatrist, [place]..    Anxiety>  He related difficulty caring for family esp elderly mother who is quite demanding & conflict w/ his daughter/ her 2 kids/ etc... We reviewed prob list, meds, xrays and labs> see below for updates >>   LABS 8/14:  FLP- ok w/ BS=74, A1c=6.4, Cr=1.4...Marland Kitchen  ~  October 08, 2015:      2y60yrV & asked to check pt by DrTJones due to cough> Jason [name] has only a minimal smoking hx- started in his late teens, smoked for only 12 yrs up to 1/2 ppd at the max, and quit in 1973 (age31); he worked at [employer] 1971 to 2005 & was exposed to 2nd hand smoke;  He has had an occas bronchitic infection, and one bout of pneumonia in 2009, but no recurrent infections, no hx TB or other toxic exposures (no known asbestos etc);  He tells me that he has been under a lot of stress- caring for wife, elderly uncle who lives w/ them, and his 91 2o mother w/ cancer now on hospice;  Due to the stress he  started smoking again 06/2014 up to 1ppd but had his last cigarette ~2wks ago- says he has quit for good now;  He is c/o cough, mild chest congestion, small amt beige sputum w/o hemoptysis; he notes occas wheezing but denies SOB, CP, palpit, ch in edema- he is still able to mow yard, walking, does chores/ shopping etc w/o giving out. W/o SOB, etc;  He notes that mucinex helps the congestion...    EXAM shows Afeb, VSS, O2sat=97% on RA;  HEENT- neg, mallampati2;  Chest- clear, sl decr BS at bases, congested cough, w/o w/r/r;  Heart- RR gr1/6SEM w/o r/g;  Abd- obese, soft, neg;  Ext- +VI otherw neg w/o c/c/e...   Last CXR 09/24/15 showed norm heart size, linear atx vs scarring at left base, otherw clear, NAD...   Spirometry 10/08/15 showed FVC=3.51 (69%), FEV1=2.68 (71%), %1sec=76%, mid-flows reduced at 64% predicted... This is suggestive of mild restrictive ventilatory defect w/ superimposed mild small airways dis...   Ambulatory oxygen saturation test 10/08/15> O2sat=97%  on RA at rest w/ pulse=72/min; he ambulated 3 laps in the office w/ lowest O2sat=92% w/ pulse=99/min...   LABS 09/2015>  Chems- wnl x BS=149, A1c=6.3;  CBC- wnl w/ Hg=14.3, WBC=5.4 & 8%eos;  TSH=1.14;  PSA=0.44 IMP/PLAN>>  Jason [name] has a min smoking hx but restarted ~80yrago, then quit again 2 wks ago; c/o cough & congestion w/ some intermittent wheezing c/w chr bronchitis; his Spirometry show some mild restriction suspected and superimposed small airways dis;  Now that he has quit smoking he does not want to get on a regular inhaler if not absolutely needed, so we decided to treat w/ PROAIR-HFA 1-2 sp Q6H prn + MUCINEX 6031m 1-2Bid w/ fluids;  If he does not improve rapidly/ resolve- then we could prescribe an ICS/LABA or use parenteral therapy if needed... We discussed an ROV in 2-14m5moooner if needed.  ~  January 01, 2016:  14mo51mo w/ SN>   Since Jason [name] was here last he has had 2 Hospitalizations:    1st Hosp was 12/21 - 11/07/15 by [facility]>  He had just been to the ER w/ bronchitis & treated w/ ZPak & Tussionex;  Presented w/ 2wk hx cough, thick yellow sput, wheezing, SOB, chest tightness;  Found to have CAP-nos, CXR showed mild cardiomeg, chr LLL scarring & superimposed LLL infiltrate; Flu panel- neg, CBC was wnl w/ 7K WBC; Chems- wnl x BS17-220 range;  Treated w/ Levaquin, NEBS, & Solumedrol=> Pred taper...    2nd Hosp was 1/1 - 11/19/15 by [facility]>  Presented to Er w/ recurrent wheezing, chest congestion, SOB & Adm w/ a "COPD exac" & O2sat said to be in the mid-80's on RA w/ ambulation in ER; No cultures done; CXR showed stable left basilar scarring, NAD;  LABS showed elev WBC=7K on Adm 7 incr to 16K w/ Solumedrol rx, Chems- wnl x BS up to 400 w/ steroids, BNP=46, A1c=8.5;  He was treated w/ more Levaquin, Solumedrol=> Pred taper, NEBS, Mucinex, etc...    Since disch he has followed up w/ DrJones x2, [facility], & went back to the ER 12/22/15 w/ hyperglycemia;  We reviewed the following medical problems during  today's office visit >>     Restrictive lung disease> due to obesity, chest wall factors, & anxiety; we reviewed diet, exercise, wt reduction strategies + offered anxiolytic rx but he declines...     Hx asthmatic bronchitis/ pneumonia> on Symbicort 2spQd, NEBS w/ Albut vs HFA inhaler prn; notes sl cough, denies dyspnea;  rec to incr Symbicort80-2spBid & use the NEBS Bid for now...    Ex-smoker> he has a min smoking hx ~10 pack-yrs total & quit in 0947; he inexplicably took it up again in 2016 but then quit again later that yr...     R/O OSA>  Pt says that his wife notes apneas while he sleeps but not c/o snoring; he has very poor sleep hygiene due to elderly uncle living w/ them; he rests poorly he says but often wakes refreshed; does note sleep pressure during the day & naps 3-4days per wk; his ESS= 18/24 and we will order a split night sleep test...     MVP>  On ASA daily & 2DEcho 10/11 showed mild LVH, norm LVF w/ EF= 55-60%, ant leaflet MVP seen w/ MR, mod LAdil; he denies CP, palpit, SOB, ch in edema...    Ven Insuffic>  Hx VV, VI, stasis changes w/ ulcer/ cellulitis in past; s/p laser to left GSV by DrEarly & s/p right 2nd toe amp for osteomyelitis by DrBednarz 2010.    Chol>  On Simva40 and FLP 11/16 shows TChol 134, TG 69, HDL 42, LDL 78... keep same dose + better diet & get wt down!    DM>  On Metformin 500Bid & Glimep24mQam; BS at home are improved & Labs here 1/17 showed BS= 150-300, A1c= 8.5; encouraged to get on diet, get wt down, f/u w/ PCP...    Obesity>  weight is down to 311# today; we reviewed the critical need for wt reducting diet + exercise program...    GI>  Adm 9/12 w/ SBO & had ELap w/ lysis of adhesions;  Now back to baseline w/o abd pain, N/V/D/C/ or blood seen...    GU>  He saw DrTannenbaum et al 3/12 (we don't have more recent notes) for f/u BPH, ED, & PSA recheck;  Voiding satis, prev on Cialis & PSAs have been good...    DJD/ LBP>  Hx LBP w/ walking, no radiation;  Hx right  foot problems w/ amp 1st 3 toes now for osteomyelitis ([facility]iatrist[place]...    Anxiety>  He related difficulty caring for family esp elderly mother who is quite demanding, then an uncle, & conflict w/ his daughter/ her 2 kids/ etc... EXAM shows Afeb, VSS, O2sat=97% on RA;  HEENT- neg, mallamapti1;  Chest decr BS, clear, Gr2/6sys murmur w/o r/g;  Abd- obeses soft, neg;  Ext- VI, stasis changes, chr edema bilat; Neuro- neuropathy w/o focal motor abd...  CXR 11/2015 showed mild cardiomeg, chr LLL scarring & superimposed LLL infiltrate=> improved   Spirometry 01/01/16> FVC=3.76 (74%), FEV1=2.84 (75%), %1sec=76, mid-flows sl reduced at 64% predicted;  This is c/w mild restrictive ventilatory defect & superimposed small airways dis...  Ambulatory oximetry 01/01/16> O2sat=97% on RA at rest w/ pulse=72/min;  He ambulated 3 laps in office w/ lowest O2sat=90% w/ heart rate 100/min IMP/PLAN>>  As above- he has a min smoking hx and evid of Restrictive lung dis at baseline; recent bronchitis=> airway inflamm [place] getting back to baseline 7 rec that he use the Symbicort80-2spBid for the airway inflamm & to keep him off Pred;  OK to use NEBS, rescue inhaler as needed;  We will order a split night sleep test & a f/u 2DEcho...  ADDENDUM> Sleep Study 01/09/16> severe OSA w/ AHI=59.4/hr, REM AHI=68.2/hr, Min O2sat=77%, Mean=92%, Time <88%=11.456m, NSR- no arrhythmias, no signif PLMS;  Split night study showed good control on CPAP=13 (few central  apneas noted);  REC> large [employer] full face Simplus mask, Res Med S10 machine Air/Auto w/ heated humidity, climate controlled tubing, Auto CPAP 5-15, enroll in [employer];  He needs ROV w/ download in 6-8 weeks... ADDENDUM> 2DEcho 01/14/16> mild conc LVH, norm sys function & no regional wall motion abnormalities, AoV mod thickened & calcif leaflets w/ restricted mobility, MV leaflets mod thicked & calcif w/ reduced separation c/w mild-mod  stenosis & mild MR, mod LA dil (66m), RV norm w/ norm sys function/ RA normal/ PAsys wnl; we will refer to [facility]...   ~  February 12, 2016:  6wk ROV & JElzyreports that his breathing is improved on SYMBICORT160-2spBid & Albut via NEB or HFA prn;  In addition he had his sleep study 01/09/16 w/ severe OSA as above & we set him up w/ CPAP Auto 5-15 and he has been using it for the last 2wks now w/ download indicating that he's using it ~8H/night w/ good control & AHI down to 5/hr;  States he's resting OK but still wakes tired, no change in daytime alertness, still naps on occas, he fells he's improving...     He saw CAR[facility], DrHochrein, 01/23/16> Miral valve dis (mild MS) and hx rheumatic fever as a child; he is relatively asymptomatic- no change in meds, encouraged diet, exercise, wt reduction; f/u planned 191yr. EXAM shows Afeb, VSS, O2sat=97% on RA;  HEENT- neg, mallamapti1;  Chest decr BS, clear, Gr2/6sys murmur w/o r/g;  Abd- obeses soft, neg;  Ext- VI, stasis changes, chr edema bilat; Neuro- neuropathy w/o focal motor abd... IMP/PLAN>>  We discussed his situation as the care giver for his family (elderly great uncle, and his wife's care needs);  Continue Symbicort, albuterol rescue prn, CPAP nightly, plus his Simva40/ Metform/ Glimep...  We plan ROV 70m8mo a full download of CPAP data...   ~  Apr 13, 2016:  70mo29mo & pulmonary f/u visit>   JennVirgietes thathe is doing well- using CPAP every night, all night long, wakes 1-2x & able to replace his mask & get back to sleep, mask fit is ok, no pressure issues & tells me wife is pleased since he is not snoring now; he note less daytime sleepiness & no prob driving... Similarly he denies cough, sput, hemoptysis, SOB, CP, etc... Insurance changed his symbicort to [employer] CPAP download> 3/26 - 04/07/16 showed good compliance (60/60 days, 8.5H per night), Autoset 5-15 w/ median press=8 & max=14, AHI=5, he has a mod intermittent leak IMP/PLAN>>      Restrictive  lung disease> due to obesity, chest wall factors, & anxiety; we reviewed diet, exercise, wt reduction strategies + offered anxiolytic rx but he declines...     Hx asthmatic bronchitis/ pneumonia> on BreoQd, NEBS w/ Albut vs HFA inhaler prn; notes sl cough, denies dyspnea/ CP/ etc...    Ex-smoker> he has a min smoking hx ~10 pack-yrs total & quit in [id number] inexplicably took it up again in 2016 but then quit again later that yr...     OSA>  wife noted apneas & some snoring; he has very poor sleep hygiene due to elderly uncle living w/ them; he rests poorly he says but often wakes refreshed; does note sleep pressure during the day & naps 3-4days per wk; his ESS= 18/24 and PSG 12/2015 showing severe OSA w/ AHI=59.4/hr, REM AHI=68.2/hr, Min O2sat=77%, Mean=92%, Time <88%=11.4min34mSR- no arrhythmias, no signif PLMS => placed on CPAP Auto 5-15 w/ excellenmt compliance & good response w/  clinical improvement...           Problem List:     His PCP is DrTJones, [place]...   Obstructive Sleep Apnea >>  ~  See Sleep Study 01/09/16 w/ AHI=59 and split night test showed good control on CPAP=13;  started on CPAP auto 5-15 and tolerating well so far... 5/17>  CPAP download showed excellent compliance & marked improveent in his OSA...  AB/ Pulmonary Restriction/ Hx Pneumonia >> seen in ER 7/09 w/ faint RLL infiltrate that cleared serially w/ antibiotic Rx... ~  CXR 9/13 showed norm heart size, clear lungs w/ mild left basilar scarring & mild degen changes in Tspine... ~  [facility] 10/2015 w/ ?LLL infiltrate/ scarring; treated w/ Levaquin, Pred, Mucinex, etc...  ~  AB resolved and stable on SYMBICORT 160-2spbid + prn albuterol NEBS or MDI...  MITRAL VALVE DISEASE >> on ASA 46m/d... he denies CP, palpit, dyspnea... exam w/ MR murmur... ~  2DEcho 5/01 showed mild ant leaflet MVP, mild MR, norm LVF. ~  f/u 2DEcho 10/11 showed mild LVH, norm LVF w/ EF= 55-60%, ant leaflet MVP seen w/ MR, mod LAdil ~  EKG 9/12 showed  NSR, rate80, NSSTTWA ~  EKG 9/13 showed NSR, rate 79, NSSTTWA... ~  EKG 11/2015 showed NSR w/ PACs, rate71, 1st degree AVB, incr voltage (r/o LVH), otherw wnl... ~  2DEcho 01/14/16 showed mild conc LVH & mild MS, he has hx rheumatic fever at age 75.. ~  CARDS eval by DrHochrein 01/2016> mild MS, he is asymptomatic & they plan f/u 156yr.  VENOUS INSUFFICIENCY (ICD-459.81) - he knows to elim salt, elevate legs, wear TED's... ~  neg ven dopplers in 1996...  ~  he saw DrLawson in 2000 for a ven stasis ulcer on his leg... rec for TED's, no salt, elevation, etc...  ~  he had normal ABI's in 10/06... ~  3/10:  left leg wound w/ cellulitis & +MRSA... Rx'd and refer back to wound care clinic. ~  7/10:  sched for right second toe amputation due to osteomyelitis despite hyperbaric O2 etc... ~  8/10: s/p right second toe amp for osteomyelitis (BBeola Cord and s/p laser ablation of left greater saphenous vein (DrEarly). ~  7/13:  He tells me that DrTuckman, Podiatrist has been treating his right great toe (ulcer on plantar surface)- we will call for notes. ~  He subsequently had 2 more toes amputated by DrDuda; subseq well healed & doing satis... ~  Chronic VI & stasis changes w/ chr edema...  HYPERCHOLESTEROLEMIA (ICD-272.0) - on SIMVASTATIN 4059mhs...  DIABETES MELLITUS (ICD-250.00) - on METFORMIN 500m70md + GLIMEPIRIDE 2mg/48mand diet efforts...  OBESITY (ICD-278.00) - he is very large at _0  tall and >300# for the last 10+ yrs... peak = 340#... he drinks beer daily... discussed diet + exercise program (and no beer)... ~  he had transient right gynecomastia 9/09, no discharge, neg mammogram & resolved on it's own...  GERD (ICD-530.81) >> not currently taking a PPI med...  DIVERTICULOSIS OF COLON (ICD-562.10) / IRRITABLE BOWEL SYNDROME (ICD-564.1) & COLONIC POLYPS (ICD-211.3) ~  colonoscopy 3/06 by DrStark showing divertics & 2-3mm p38mps (adenomas)... ~  colonoscopy 9/11 by DrStark showed divertics &  4mm po98m= tubular adenoma... f/u planned 3yrs. ~21yrlonoscopy 7/16 by DrStark showed mild diertics, int hems, 5 polyps 4-6mm size58mBx= tubular adenomas and one serrated polyp, no atypia...  BENIGN PROSTATIC HYPERTROPHY & ED - eval by DrTannenbaum 7/08 and Rx w/ Cialis; he has tried  the PEP shots; + fam hx prostate ca in his father... ~  8/10:  DrTannenbaum did circ for phimosis, pt states that PSA check was OK... ~  3/12:  f/u BPH, ED, & PSA recheck;  Voiding satis, given Cialis & PSA= 0.47 (he had 58cc PVR- not on meds at present).  DEGENERATIVE JOINT DISEASE (ICD-715.90) - he uses OTC meds Prn, and was rec to start Vit supplements by DrTannenbaum- takes MVI, VitC, VitD... he has seen DrHilts in the past... ~  He has had the 1st 3 toes of right foot amputated...  Hx of HIDRADENITIS (ICD-705.83)  ANXIETY>  He related difficulty caring for family esp elderly mother who is quite demanding & conflict w/ his daughter/ her 2 kids/ etc...  Health Maintenance - up to date on colon & prostate screens... PNEUMOVAX 9/09... TETANUS booster 3/10... gets yearly Flu vaccines in Fall.   Past Surgical History  Procedure Laterality Date  . Knee arthroscopy    . Toe amputation  06/2009    x3Right foot second toe -Dr Beola Cord  . Laser ablation  06/2009    Left Greater Saphenous vein -Dr Donnetta Hutching  . Circumcision  06/2100    For Phimosis - Dr Hartley Barefoot  . Laparotomy  07/20/11  . Colon surgery    . Tonsillectomy    . Amputation  08/09/2012    Procedure: AMPUTATION DIGIT;  Surgeon: Newt Minion, MD;  Location: [facility];  Service: Orthopedics;  Laterality: Right;  Amputation right Great Toe MTP Joint  . Amputation  10/17/2012    Procedure: AMPUTATION DIGIT;  Surgeon: Newt Minion, MD;  Location: [facility];  Service: Orthopedics;  Laterality: Right;  Right Foot 3rd Toe Amputation at Metatarsophalangeal Joint    Outpatient Encounter Prescriptions as of 04/13/2016  Medication Sig  . albuterol (PROVENTIL HFA;VENTOLIN HFA)  108 (90 BASE) MCG/ACT inhaler Inhale 1-2 puffs into the lungs every 6 (six) hours as needed for wheezing or shortness of breath.  Marland Kitchen albuterol (PROVENTIL) (2.5 MG/3ML) 0.083% nebulizer solution Take 3 mLs (2.5 mg total) by nebulization every 4 (four) hours as needed for wheezing.  Marland Kitchen aspirin EC 81 MG tablet Take 1 tablet (81 mg total) by mouth daily.  . blood glucose meter kit and supplies KIT Use to test blood sugar up to 3 times daily. DX E11.9  . Blood Glucose Monitoring Suppl (TRUE METRIX METER) w/Device KIT Use to check blood sugar DX E11.8  . fluticasone furoate-vilanterol (BREO ELLIPTA) 200-25 MCG/INH AEPB Inhale 1 puff into the lungs daily.  Marland Kitchen glimepiride (AMARYL) 2 MG tablet TAKE 1 TABLET EVERY DAY WITH BREAKFAST  . glucose blood (COOL BLOOD GLUCOSE TEST STRIPS) test strip Use to test blood sugar up to 3 times daily. DX E11.9  . Lancets MISC Use to test blood sugar up to 3 times daily. DX E11.9  . metFORMIN (GLUCOPHAGE) 500 MG tablet TAKE 1 TABLET TWICE DAILY WITH MEALS  . simvastatin (ZOCOR) 40 MG tablet TAKE 1 TABLET EVERY EVENING  . vitamin C (ASCORBIC ACID) 500 MG tablet Take 1 tablet (500 mg total) by mouth daily.   No facility-administered encounter medications on file as of 04/13/2016.    No Known Allergies   Current Medications, Allergies, Past Medical History, Past Surgical History, Family History, and Social History were reviewed in [employer] record.    Review of Systems         See HPI - all other systems neg except as noted> c/o recent cough, chest congestion  The patient denies anorexia, fever, weight loss, weight gain, vision loss, decreased hearing, hoarseness, chest pain, syncope, dyspnea on exertion, peripheral edema, headaches, hemoptysis, abdominal pain, melena, hematochezia, severe indigestion/heartburn, hematuria, incontinence, muscle weakness, suspicious skin lesions, transient blindness, difficulty walking, depression, unusual weight  change, abnormal bleeding, enlarged lymph nodes, and angioedema.     Objective:   Physical Exam     WD, Overweight, very large 75 y/o BM in NAD... GENERAL:  Alert & oriented; pleasant & cooperative. HEENT:  [place]/AT, EOM-wnl, PERRLA, EACs-clear, TMs-wnl, NOSE-clear, THROAT-clear & wnl (mallamapti2) NECK:  Supple w/ fairROM; no JVD; normal carotid impulses w/o bruits; no thyromegaly or nodules palpated; no lymphadenopathy. CHEST:  Clear to P & A; without wheezes/ rales/ or rhonchi heard, no consolidation... HEART:  Regular Rhythm; gr 1-2/6 SEM without rubs or gallops detected... ABDOMEN:  Soft & nontender; normal bowel sounds; no organomegaly or masses detected. EXT: without deformities, mod arthritic changes; wearing TEDs +varicose veins/ +venous insuffic/ chr edema... NEURO:  CN's intact;  no focal neuro deficits... Derm:  Mild stasis changes in LEs w/o cellulitis etc...  RADIOLOGY DATA:  Reviewed in the EPIC EMR & discussed w/ the patient...  LABORATORY DATA:  Reviewed in the EPIC EMR & discussed w/ the patient...   Assessment & Plan:       1) Restrictive lung disease> due to obesity, chest wall factors, & anxiety; we reviewed diet, exercise, wt reduction strategies + offered anxiolytic rx but he declines.Marland KitchenMarland Kitchen     2) Hx asthmatic bronchitis/ pneumonia> on Symbicort 2spQd, NEBS w/ Albut vs HFA inhaler prn; notes sl cough, denies dyspnea; rec to incr Symbicort80-2spBid & use the NEBS Bid for now...    3) Ex-smoker> he has a min smoking hx ~10 pack-yrs total & quit in 7619; he inexplicably took it up again in 2016 but then quit again later that yr...     4) OSA>  wife noted apneas & some snoring; he has very poor sleep hygiene due to elderly uncle living w/ them; he rests poorly he says but often wakes refreshed; does note sleep pressure during the day & naps 3-4days per wk; his ESS= 18/24 and PSG 12/2015 showing severe OSA w/ AHI=59.4/hr, REM AHI=68.2/hr, Min O2sat=77%, Mean=92%, Time  <88%=11.50mn, NSR- no arrhythmias, no signif PLMS => placed on CPAP Auto 5-15 w/ excellenmt compliance & good response w/ clinical improvement...   MEDICAL ISSUES per DrTJones>>     HxMVP=> 2DEcho w/ mild MS>  Mild MVP on 2DEcho 2011 & he denies CP, palpit, ch in SOB, edema, etc; repeat Echo 2017 w/ mild MS & he was seen by DrHochrein for CARDS.    Ven Insuffic>  He had vein surg from DrEarly; knows to elim sodium, elevate legs, wear support hose, etc...    Chol>  On Simva40 + diet;  FLP looks good...    DM>  Controlled on Metformin & Glimepiride; he needs better diet, incr exercise, get wt down...       Diabetic foot ulcer> he lost right 2nd toe to osteomyelitis in 2010; now w/ ulcer on plantar aspect great toe> under the care of Podiatry=> DrDuda had to amputate 2 toes 9/13...    Obesity>  As noted, wt down to 310# after his hosp for SBO; then right back up to 329# after the holidays...    GI> s/p SBO w/ surg, Hx GERD/ Divertics/ IBS/ Polyps> GI per DrStark & post-op DrBlackman...    GU> BPH & ED followed by DrTannenbaum..Marland KitchenMarland Kitchen  DJD> on OTC analgesics as needed...    ANXIETY>  We discussed coping strategies for the family stress...  PLAN>>   01/01/16>  As above- he has a min smoking hx and evid of Restrictive lung dis at baseline; recent bronchitis=> airway inflamm & pneumonia (nos) treated in [place]; now getting back to baseline 7 rec that he use the Symbicort80-2spBid for the airway inflamm & to keep him off Pred;  OK to use NEBS, rescue inhaler as needed;  We will order a split night sleep test & a f/u 2DEcho... 3/30>  We discussed his situation as the care giver for his family (elderly great uncle, and his wife's care needs);  Continue Symbicort, albuterol rescue prn, CPAP nightly, plus his Simva40/ Metform/ Glimep...  We plan ROV 18mow/ a full download of CPAP data   Patient's Medications  New Prescriptions   No medications on file  Previous Medications   ALBUTEROL (PROVENTIL HFA;VENTOLIN  HFA) 108 (90 BASE) MCG/ACT INHALER    Inhale 1-2 puffs into the lungs every 6 (six) hours as needed for wheezing or shortness of breath.   ALBUTEROL (PROVENTIL) (2.5 MG/3ML) 0.083% NEBULIZER SOLUTION    Take 3 mLs (2.5 mg total) by nebulization every 4 (four) hours as needed for wheezing.   ASPIRIN EC 81 MG TABLET    Take 1 tablet (81 mg total) by mouth daily.   BLOOD GLUCOSE METER KIT AND SUPPLIES KIT    Use to test blood sugar up to 3 times daily. DX E11.9   BLOOD GLUCOSE MONITORING SUPPL (TRUE METRIX METER) W/DEVICE KIT    Use to check blood sugar DX E11.8   FLUTICASONE FUROATE-VILANTEROL (BREO ELLIPTA) 200-25 MCG/INH AEPB    Inhale 1 puff into the lungs daily.   GLIMEPIRIDE (AMARYL) 2 MG TABLET    TAKE 1 TABLET EVERY DAY WITH BREAKFAST   GLUCOSE BLOOD (COOL BLOOD GLUCOSE TEST STRIPS) TEST STRIP    Use to test blood sugar up to 3 times daily. DX E11.9   LANCETS MISC    Use to test blood sugar up to 3 times daily. DX E11.9   METFORMIN (GLUCOPHAGE) 500 MG TABLET    TAKE 1 TABLET TWICE DAILY WITH MEALS   SIMVASTATIN (ZOCOR) 40 MG TABLET    TAKE 1 TABLET EVERY EVENING   VITAMIN C (ASCORBIC ACID) 500 MG TABLET    Take 1 tablet (500 mg total) by mouth daily.  Modified Medications   No medications on file  Discontinued Medications   No medications on file

## 2016-04-13 NOTE — Patient Instructions (Signed)
Today we updated your med list in our EPIC system...    Continue your current medications the same...  Let's get on track w/ our diet 7 exercise program...  Call for any questions...  Let's plan a follow up visit in 36mo, sooner if needed for breathing problems.Marland KitchenMarland Kitchen

## 2016-04-19 DIAGNOSIS — J441 Chronic obstructive pulmonary disease with (acute) exacerbation: Secondary | ICD-10-CM | POA: Diagnosis not present

## 2016-04-28 ENCOUNTER — Encounter: Payer: Self-pay | Admitting: Internal Medicine

## 2016-04-28 ENCOUNTER — Other Ambulatory Visit (INDEPENDENT_AMBULATORY_CARE_PROVIDER_SITE_OTHER): Payer: Commercial Managed Care - HMO

## 2016-04-28 ENCOUNTER — Ambulatory Visit (INDEPENDENT_AMBULATORY_CARE_PROVIDER_SITE_OTHER): Payer: Commercial Managed Care - HMO | Admitting: Internal Medicine

## 2016-04-28 ENCOUNTER — Ambulatory Visit: Payer: Commercial Managed Care - HMO | Admitting: Internal Medicine

## 2016-04-28 VITALS — Ht >= 80 in

## 2016-04-28 DIAGNOSIS — E1165 Type 2 diabetes mellitus with hyperglycemia: Secondary | ICD-10-CM | POA: Diagnosis not present

## 2016-04-28 DIAGNOSIS — D539 Nutritional anemia, unspecified: Secondary | ICD-10-CM | POA: Diagnosis not present

## 2016-04-28 DIAGNOSIS — E118 Type 2 diabetes mellitus with unspecified complications: Secondary | ICD-10-CM

## 2016-04-28 DIAGNOSIS — IMO0002 Reserved for concepts with insufficient information to code with codable children: Secondary | ICD-10-CM

## 2016-04-28 LAB — POCT GLYCOSYLATED HEMOGLOBIN (HGB A1C): Hemoglobin A1C: 7.2

## 2016-04-28 LAB — RETICULOCYTES
ABS Retic: 80280 cells/uL (ref 25000–90000)
RBC.: 4.46 MIL/uL (ref 4.20–5.80)
Retic Ct Pct: 1.8 %

## 2016-04-28 LAB — FERRITIN: FERRITIN: 49.6 ng/mL (ref 22.0–322.0)

## 2016-04-28 LAB — CBC WITH DIFFERENTIAL/PLATELET
Basophils Absolute: 0 10*3/uL (ref 0.0–0.1)
Basophils Relative: 0.5 % (ref 0.0–3.0)
EOS PCT: 11.1 % — AB (ref 0.0–5.0)
Eosinophils Absolute: 0.5 10*3/uL (ref 0.0–0.7)
HCT: 40.6 % (ref 39.0–52.0)
Hemoglobin: 13.7 g/dL (ref 13.0–17.0)
LYMPHS ABS: 1 10*3/uL (ref 0.7–4.0)
Lymphocytes Relative: 23 % (ref 12.0–46.0)
MCHC: 33.6 g/dL (ref 30.0–36.0)
MCV: 92.3 fl (ref 78.0–100.0)
MONO ABS: 0.4 10*3/uL (ref 0.1–1.0)
Monocytes Relative: 8.2 % (ref 3.0–12.0)
NEUTROS ABS: 2.5 10*3/uL (ref 1.4–7.7)
NEUTROS PCT: 57.2 % (ref 43.0–77.0)
PLATELETS: 172 10*3/uL (ref 150.0–400.0)
RBC: 4.4 Mil/uL (ref 4.22–5.81)
RDW: 12.5 % (ref 11.5–15.5)
WBC: 4.4 10*3/uL (ref 4.0–10.5)

## 2016-04-28 LAB — BASIC METABOLIC PANEL
BUN: 13 mg/dL (ref 6–23)
CHLORIDE: 102 meq/L (ref 96–112)
CO2: 29 meq/L (ref 19–32)
CREATININE: 0.86 mg/dL (ref 0.40–1.50)
Calcium: 8.9 mg/dL (ref 8.4–10.5)
GFR: 111.76 mL/min (ref >60.00)
GLUCOSE: 187 mg/dL — AB (ref 70–99)
Potassium: 4 mEq/L (ref 3.5–5.1)
Sodium: 138 mEq/L (ref 135–145)

## 2016-04-28 LAB — IBC PANEL
IRON: 116 ug/dL (ref 42–165)
Saturation Ratios: 33.5 % (ref 20.0–50.0)
Transferrin: 247 mg/dL (ref 212.0–360.0)

## 2016-04-28 LAB — FOLATE

## 2016-04-28 LAB — VITAMIN B12: Vitamin B-12: 401 pg/mL (ref 211–911)

## 2016-04-28 NOTE — Patient Instructions (Signed)
Anemia, Nonspecific Anemia is a condition in which the concentration of red blood cells or hemoglobin in the blood is below normal. Hemoglobin is a substance in red blood cells that carries oxygen to the tissues of the body. Anemia results in not enough oxygen reaching these tissues.  CAUSES  Common causes of anemia include:   Excessive bleeding. Bleeding may be internal or external. This includes excessive bleeding from periods (in women) or from the intestine.   Poor nutrition.   Chronic kidney, thyroid, and liver disease.  Bone marrow disorders that decrease red blood cell production.  Cancer and treatments for cancer.  HIV, AIDS, and their treatments.  Spleen problems that increase red blood cell destruction.  Blood disorders.  Excess destruction of red blood cells due to infection, medicines, and autoimmune disorders. SIGNS AND SYMPTOMS   Minor weakness.   Dizziness.   Headache.  Palpitations.   Shortness of breath, especially with exercise.   Paleness.  Cold sensitivity.  Indigestion.  Nausea.  Difficulty sleeping.  Difficulty concentrating. Symptoms may occur suddenly or they may develop slowly.  DIAGNOSIS  Additional blood tests are often needed. These help your health care provider determine the best treatment. Your health care provider will check your stool for blood and look for other causes of blood loss.  TREATMENT  Treatment varies depending on the cause of the anemia. Treatment can include:   Supplements of iron, vitamin B12, or folic acid.   Hormone medicines.   A blood transfusion. This may be needed if blood loss is severe.   Hospitalization. This may be needed if there is significant continual blood loss.   Dietary changes.  Spleen removal. HOME CARE INSTRUCTIONS Keep all follow-up appointments. It often takes many weeks to correct anemia, and having your health care provider check on your condition and your response to  treatment is very important. SEEK IMMEDIATE MEDICAL CARE IF:   You develop extreme weakness, shortness of breath, or chest pain.   You become dizzy or have trouble concentrating.  You develop heavy vaginal bleeding.   You develop a rash.   You have bloody or black, tarry stools.   You faint.   You vomit up blood.   You vomit repeatedly.   You have abdominal pain.  You have a fever or persistent symptoms for more than 2-3 days.   You have a fever and your symptoms suddenly get worse.   You are dehydrated.  MAKE SURE YOU:  Understand these instructions.  Will watch your condition.  Will get help right away if you are not doing well or get worse.   This information is not intended to replace advice given to you by your health care provider. Make sure you discuss any questions you have with your health care provider.   Document Released: 12/09/2004 Document Revised: 07/04/2013 Document Reviewed: 04/27/2013 Elsevier Interactive Patient Education 2016 [employer]  

## 2016-04-28 NOTE — Progress Notes (Signed)
Pre visit review using our clinic review tool, if applicable. No additional management support is needed unless otherwise documented below in the visit note. 

## 2016-04-28 NOTE — Progress Notes (Signed)
Subjective:  Patient ID: Jason [name], male    DOB: 1941-05-18  Age: 75 y.o. MRN: [id number]  CC: Anemia and Diabetes   HPI Jason [name] presents for a follow-up on anemia, type 2 diabetes mellitus, and asthma. He was seen about 4 months ago and found to have a normochromic/normocytic anemia. He is not aware of any sources of blood loss and he complains of fatigue but he denies paresthesias, easy bruising, or bleeding complications. His wheezing is well-controlled with the combination of a LABA/ICS combination. He denies any recent episodes of cough/wheezing/shortness of breath.  Outpatient Prescriptions Prior to Visit  Medication Sig Dispense Refill  . albuterol (PROVENTIL HFA;VENTOLIN HFA) 108 (90 BASE) MCG/ACT inhaler Inhale 1-2 puffs into the lungs every 6 (six) hours as needed for wheezing or shortness of breath. 3 Inhaler 2  . albuterol (PROVENTIL) (2.5 MG/3ML) 0.083% nebulizer solution Take 3 mLs (2.5 mg total) by nebulization every 4 (four) hours as needed for wheezing. 75 mL 12  . aspirin EC 81 MG tablet Take 1 tablet (81 mg total) by mouth daily. 90 tablet 3  . blood glucose meter kit and supplies KIT Use to test blood sugar up to 3 times daily. DX E11.9 1 each 0  . Blood Glucose Monitoring Suppl (TRUE METRIX METER) w/Device KIT Use to check blood sugar DX E11.8 1 kit 0  . fluticasone furoate-vilanterol (BREO ELLIPTA) 200-25 MCG/INH AEPB Inhale 1 puff into the lungs daily. 30 each 11  . glimepiride (AMARYL) 2 MG tablet TAKE 1 TABLET EVERY DAY WITH BREAKFAST 90 tablet 3  . glucose blood (COOL BLOOD GLUCOSE TEST STRIPS) test strip Use to test blood sugar up to 3 times daily. DX E11.9 300 each 3  . Lancets MISC Use to test blood sugar up to 3 times daily. DX E11.9 300 each 3  . metFORMIN (GLUCOPHAGE) 500 MG tablet TAKE 1 TABLET TWICE DAILY WITH MEALS 180 tablet 1  . simvastatin (ZOCOR) 40 MG tablet TAKE 1 TABLET EVERY EVENING 90 tablet 3  . vitamin C (ASCORBIC ACID) 500 MG tablet  Take 1 tablet (500 mg total) by mouth daily. 90 tablet 3   No facility-administered medications prior to visit.    ROS Review of Systems  Constitutional: Positive for fatigue.  HENT: Negative.  Negative for nosebleeds, sinus pressure, sore throat and trouble swallowing.   Eyes: Negative.   Respiratory: Negative.  Negative for cough, choking, chest tightness, shortness of breath and stridor.   Cardiovascular: Negative.  Negative for chest pain, palpitations and leg swelling.  Gastrointestinal: Negative.  Negative for nausea, vomiting, abdominal pain, diarrhea, constipation and blood in stool.  Endocrine: Negative.   Genitourinary: Negative.   Musculoskeletal: Positive for back pain. Negative for myalgias, joint swelling, arthralgias and neck pain.  Skin: Negative.  Negative for color change and rash.  Allergic/Immunologic: Negative.   Neurological: Negative.  Negative for dizziness, tremors, weakness, light-headedness, numbness and headaches.  Hematological: Negative.  Negative for adenopathy. Does not bruise/bleed easily.  Psychiatric/Behavioral: Negative.     Objective:  Ht '6\' 8"'  (2.032 m)  BP Readings from Last 3 Encounters:  04/13/16 128/66  02/12/16 118/70  01/23/16 120/64    Wt Readings from Last 3 Encounters:  04/13/16 312 lb (141.522 kg)  02/12/16 309 lb 9.6 oz (140.434 kg)  01/23/16 309 lb 8 oz (140.388 kg)    Physical Exam  Constitutional: He is oriented to person, place, and time. He appears well-developed and well-nourished. No distress.  HENT:  Head: Normocephalic and atraumatic.  Mouth/Throat: Oropharynx is clear and moist. No oropharyngeal exudate.  Eyes: Conjunctivae are normal. Right eye exhibits no discharge. Left eye exhibits no discharge. No scleral icterus.  Neck: Normal range of motion. Neck supple. No JVD present. No tracheal deviation present. No thyromegaly present.  Cardiovascular: Normal rate, regular rhythm, normal heart sounds and intact distal  pulses.  Exam reveals no gallop and no friction rub.   No murmur heard. Pulmonary/Chest: Effort normal and breath sounds normal. No stridor. No respiratory distress. He has no wheezes. He has no rales. He exhibits no tenderness.  Abdominal: Soft. Bowel sounds are normal. He exhibits no distension and no mass. There is no tenderness. There is no rebound and no guarding.  Musculoskeletal: Normal range of motion. He exhibits no edema or tenderness.  Lymphadenopathy:    He has no cervical adenopathy.  Neurological: He is oriented to person, place, and time.  Skin: Skin is warm and dry. No rash noted. He is not diaphoretic. No erythema. No pallor.  Vitals reviewed.   Lab Results  Component Value Date   WBC 4.4 04/28/2016   HGB 13.7 04/28/2016   HCT 40.6 04/28/2016   PLT 172.0 04/28/2016   GLUCOSE 187* 04/28/2016   CHOL 134 09/24/2015   TRIG 69.0 09/24/2015   HDL 41.70 09/24/2015   LDLCALC 78 09/24/2015   ALT 37 11/18/2015   AST 21 11/18/2015   NA 138 04/28/2016   K 4.0 04/28/2016   CL 102 04/28/2016   CREATININE 0.86 04/28/2016   BUN 13 04/28/2016   CO2 29 04/28/2016   TSH 1.14 09/24/2015   PSA 0.44 09/24/2015   INR 1.05 10/17/2012   HGBA1C 7.2 04/28/2016   MICROALBUR 2.3* 09/24/2015    No results found.  Assessment & Plan:   Jason [name] was seen today for anemia and diabetes.  Diagnoses and all orders for this visit:  Deficiency anemia- his H&H are normal now, all of his vitamin levels are normal, will continue to monitor for a recurrence of anemia. -     CBC with Differential/Platelet; Future -     Vitamin B1; Future -     Reticulocytes; Future -     Vitamin B12; Future -     IBC panel; Future -     Folate; Future -     Ferritin; Future  Uncontrolled type 2 diabetes mellitus with complication, without long-term current use of insulin ([place])- His A1c is 7.2% today, improvement noted, he was praised for eating better control of his blood sugars. -     Basic metabolic  panel; Future   I am having Jason [name] maintain his vitamin C, aspirin EC, albuterol, albuterol, glimepiride, simvastatin, metFORMIN, blood glucose meter kit and supplies, Lancets, glucose blood, TRUE METRIX METER, and fluticasone furoate-vilanterol.  No orders of the defined types were placed in this encounter.     Follow-up: Return in about 6 months (around 10/28/2016).  Scarlette Calico, MD

## 2016-04-29 DIAGNOSIS — J441 Chronic obstructive pulmonary disease with (acute) exacerbation: Secondary | ICD-10-CM | POA: Diagnosis not present

## 2016-04-29 DIAGNOSIS — G4733 Obstructive sleep apnea (adult) (pediatric): Secondary | ICD-10-CM | POA: Diagnosis not present

## 2016-05-05 LAB — VITAMIN B1: Vitamin B1 (Thiamine): 9 nmol/L (ref 8–30)

## 2016-05-07 ENCOUNTER — Telehealth: Payer: Self-pay | Admitting: Pulmonary Disease

## 2016-05-07 NOTE — Telephone Encounter (Signed)
Called spoke with Jason [name]. She states that she received the ov notes from 04/13/16 and nothing further is need at this time.   Called spoke with pt. Informed him of the above. He voiced understanding and had no further questions.

## 2016-05-10 DIAGNOSIS — L97511 Non-pressure chronic ulcer of other part of right foot limited to breakdown of skin: Secondary | ICD-10-CM | POA: Diagnosis not present

## 2016-05-19 DIAGNOSIS — J441 Chronic obstructive pulmonary disease with (acute) exacerbation: Secondary | ICD-10-CM | POA: Diagnosis not present

## 2016-05-24 DIAGNOSIS — L97511 Non-pressure chronic ulcer of other part of right foot limited to breakdown of skin: Secondary | ICD-10-CM | POA: Diagnosis not present

## 2016-05-29 DIAGNOSIS — G4733 Obstructive sleep apnea (adult) (pediatric): Secondary | ICD-10-CM | POA: Diagnosis not present

## 2016-05-29 DIAGNOSIS — J441 Chronic obstructive pulmonary disease with (acute) exacerbation: Secondary | ICD-10-CM | POA: Diagnosis not present

## 2016-06-10 ENCOUNTER — Other Ambulatory Visit: Payer: Self-pay | Admitting: Internal Medicine

## 2016-06-19 DIAGNOSIS — J441 Chronic obstructive pulmonary disease with (acute) exacerbation: Secondary | ICD-10-CM | POA: Diagnosis not present

## 2016-06-29 DIAGNOSIS — J441 Chronic obstructive pulmonary disease with (acute) exacerbation: Secondary | ICD-10-CM | POA: Diagnosis not present

## 2016-06-29 DIAGNOSIS — G4733 Obstructive sleep apnea (adult) (pediatric): Secondary | ICD-10-CM | POA: Diagnosis not present

## 2016-07-20 DIAGNOSIS — J441 Chronic obstructive pulmonary disease with (acute) exacerbation: Secondary | ICD-10-CM | POA: Diagnosis not present

## 2016-07-30 DIAGNOSIS — G4733 Obstructive sleep apnea (adult) (pediatric): Secondary | ICD-10-CM | POA: Diagnosis not present

## 2016-07-30 DIAGNOSIS — J441 Chronic obstructive pulmonary disease with (acute) exacerbation: Secondary | ICD-10-CM | POA: Diagnosis not present

## 2016-08-19 DIAGNOSIS — J441 Chronic obstructive pulmonary disease with (acute) exacerbation: Secondary | ICD-10-CM | POA: Diagnosis not present

## 2016-08-29 DIAGNOSIS — J441 Chronic obstructive pulmonary disease with (acute) exacerbation: Secondary | ICD-10-CM | POA: Diagnosis not present

## 2016-08-29 DIAGNOSIS — G4733 Obstructive sleep apnea (adult) (pediatric): Secondary | ICD-10-CM | POA: Diagnosis not present

## 2016-09-19 DIAGNOSIS — J441 Chronic obstructive pulmonary disease with (acute) exacerbation: Secondary | ICD-10-CM | POA: Diagnosis not present

## 2016-09-29 DIAGNOSIS — J441 Chronic obstructive pulmonary disease with (acute) exacerbation: Secondary | ICD-10-CM | POA: Diagnosis not present

## 2016-09-29 DIAGNOSIS — G4733 Obstructive sleep apnea (adult) (pediatric): Secondary | ICD-10-CM | POA: Diagnosis not present

## 2016-10-06 ENCOUNTER — Other Ambulatory Visit: Payer: Self-pay

## 2016-10-06 MED ORDER — GLIMEPIRIDE 2 MG PO TABS: 2.0000 mg | ORAL_TABLET | Freq: Every day | ORAL | 0 refills | Status: DC

## 2016-10-06 MED ORDER — METFORMIN HCL 500 MG PO TABS: 500.0000 mg | ORAL_TABLET | Freq: Two times a day (BID) | ORAL | 0 refills | Status: DC

## 2016-10-14 ENCOUNTER — Encounter: Payer: Self-pay | Admitting: Pulmonary Disease

## 2016-10-14 ENCOUNTER — Ambulatory Visit (INDEPENDENT_AMBULATORY_CARE_PROVIDER_SITE_OTHER)
Admission: RE | Admit: 2016-10-14 | Discharge: 2016-10-14 | Disposition: A | Discharge status: Home / Self Care | Payer: Commercial Managed Care - HMO | Source: Ambulatory Visit | Attending: Pulmonary Disease | Admitting: Pulmonary Disease

## 2016-10-14 ENCOUNTER — Ambulatory Visit (INDEPENDENT_AMBULATORY_CARE_PROVIDER_SITE_OTHER): Payer: Commercial Managed Care - HMO | Admitting: Pulmonary Disease

## 2016-10-14 VITALS — BP 116/64 | HR 65 | Temp 97.2°F | Ht >= 80 in | Wt 308.4 lb

## 2016-10-14 DIAGNOSIS — J4540 Moderate persistent asthma, uncomplicated: Secondary | ICD-10-CM | POA: Diagnosis not present

## 2016-10-14 DIAGNOSIS — Z794 Long term (current) use of insulin: Secondary | ICD-10-CM

## 2016-10-14 DIAGNOSIS — G4733 Obstructive sleep apnea (adult) (pediatric): Secondary | ICD-10-CM | POA: Diagnosis not present

## 2016-10-14 DIAGNOSIS — I739 Peripheral vascular disease, unspecified: Secondary | ICD-10-CM

## 2016-10-14 DIAGNOSIS — I059 Rheumatic mitral valve disease, unspecified: Secondary | ICD-10-CM | POA: Diagnosis not present

## 2016-10-14 DIAGNOSIS — E785 Hyperlipidemia, unspecified: Secondary | ICD-10-CM

## 2016-10-14 DIAGNOSIS — J189 Pneumonia, unspecified organism: Secondary | ICD-10-CM | POA: Diagnosis not present

## 2016-10-14 DIAGNOSIS — E6609 Other obesity due to excess calories: Secondary | ICD-10-CM

## 2016-10-14 DIAGNOSIS — E118 Type 2 diabetes mellitus with unspecified complications: Secondary | ICD-10-CM

## 2016-10-14 DIAGNOSIS — IMO0002 Reserved for concepts with insufficient information to code with codable children: Secondary | ICD-10-CM

## 2016-10-14 DIAGNOSIS — Z6834 Body mass index (BMI) 34.0-34.9, adult: Secondary | ICD-10-CM

## 2016-10-14 DIAGNOSIS — E1165 Type 2 diabetes mellitus with hyperglycemia: Secondary | ICD-10-CM

## 2016-10-14 MED ORDER — FLUTICASONE FUROATE-VILANTEROL 100-25 MCG/INH IN AEPB: 1.0000 | INHALATION_SPRAY | Freq: Every day | RESPIRATORY_TRACT | 0 refills | Status: DC

## 2016-10-14 NOTE — Patient Instructions (Signed)
Today we updated your med list in our EPIC system...    Continue your current medications the same...  Today we did a follow up CXR...    We will contact you w/ the results when available...   Remember to keep current on your mask & tubing changes for the CPAP thru [facility]...  Stay as active as possible...    Count calories and try to get your weight down further- no sugars, sweets, low CARB...  Call for any questions...  Let's plan a follow up visit in 52mo, sooner if needed for problems.Marland KitchenMarland Kitchen

## 2016-10-15 DIAGNOSIS — G4733 Obstructive sleep apnea (adult) (pediatric): Secondary | ICD-10-CM | POA: Diagnosis not present

## 2016-10-15 DIAGNOSIS — J441 Chronic obstructive pulmonary disease with (acute) exacerbation: Secondary | ICD-10-CM | POA: Diagnosis not present

## 2016-10-19 DIAGNOSIS — J441 Chronic obstructive pulmonary disease with (acute) exacerbation: Secondary | ICD-10-CM | POA: Diagnosis not present

## 2016-10-29 DIAGNOSIS — G4733 Obstructive sleep apnea (adult) (pediatric): Secondary | ICD-10-CM | POA: Diagnosis not present

## 2016-10-29 DIAGNOSIS — J441 Chronic obstructive pulmonary disease with (acute) exacerbation: Secondary | ICD-10-CM | POA: Diagnosis not present

## 2016-11-03 ENCOUNTER — Telehealth: Payer: Self-pay | Admitting: Pulmonary Disease

## 2016-11-03 NOTE — Telephone Encounter (Signed)
Please notify patient>   CXR is ok- stable- NAD... Mild scarring at left base, unchanged, otherw clear...  Spoke with pt and notified of results per Dr. Lenna Gilford. Pt verbalized understanding and denied any questions.

## 2016-11-11 NOTE — Progress Notes (Signed)
Subjective:    Patient ID: Jason [name], male    DOB: Aug 24, 1941, 75 y.o.   MRN: [id number]  HPI 75 y/o BM here for a follow up visit... he has mult med problems as listed below>> ~  SEE PREV EPIC NOTES FOR OLDER DATA >>   ~  Adm 07/2011 for SBO & required ELap w/ lysis of adhesions by DrBlackman, CCS; post-op problems included ileus, cough, pleural effusion, & minor wound complic managed as outpt & now back to baseline...  LABS 7/13:  Chems- ok x BS=116, A1c=7.1  LABS 1/14:  FLP- at goals on Simva40;  Chems- ok x BS=146, A1c=7.2;  TSH=1.19, PSA=0.38   ~  July 10, 2013:  72moROV & Jason [name] has a rash on his arm- pruritic, was working in his yard, & c/w mild poison ivy- improved w/ Calamine & OTC meds... We reviewed the following medical problems during today's office visit >>`    MVP>  On ASA daily & 2DEcho 10/11 showed mild LVH, norm LVF w/ EF= 55-60%, ant leaflet MVP seen w/ MR, mod LAdil; he denies CP, palpit, SOB, edema...    Ven Insuffic>  Hx VV, VI, stasis changes w/ ulcer/ cellulitis in past; s/p laser to left GSV by DrEarly & s/p right 2nd toe amp for osteomyelitis by DrBednarz 2010.    Chol>  On Simva40 and FLP 1/14 shows TChol 136, TG 109, HDL 44, LDL 71... keep same dose + better diet & get wt down!    DM>  On Metformin 500Bid & Glimep188mam; BS at home are improved & Labs here 8/14 showed BS= 74, A1c= 6.4; encouraged to lose wt...    Obesity>  weight is down to 314# today; we reviewed the critical need for wt reducting diet + exercise program...    GI>  Adm 9/12 w/ SBO & had ELap w/ lysis of adhesions;  Now back to baseline w/o abd pain, N/V/D/C/ or blood seen...    GU>  He saw DrTannenbaum et al 3/12 (we don't have more recent notes) for f/u BPH, ED, & PSA recheck;  Voiding satis, given Cialis & PSA= 0.47 (he had 58cc PVR- not on meds at present); PSA checks here have been stable at 0.38...    DJD/ LBP>  Hx LBP w/ walking, no radiation;  Hx right foot problems w/ amp 1st 3 toes  now for osteomyelitis (B[facility] CordPodiatrist, [place]..    Anxiety>  He related difficulty caring for family esp elderly mother who is quite demanding & conflict w/ his daughter/ her 2 kids/ etc... We reviewed prob list, meds, xrays and labs> see below for updates >>   LABS 8/14:  FLP- ok w/ BS=74, A1c=6.4, Cr=1.4...Marland Kitchen  ~  October 08, 2015:      2y60yrV & asked to check pt by DrTJones due to cough> Jason [name] has only a minimal smoking hx- started in his late teens, smoked for only 12 yrs up to 1/2 ppd at the max, and quit in 1973 (age31); he worked at [employer] 1971 to 2005 & was exposed to 2nd hand smoke;  He has had an occas bronchitic infection, and one bout of pneumonia in 2009, but no recurrent infections, no hx TB or other toxic exposures (no known asbestos etc);  He tells me that he has been under a lot of stress- caring for wife, elderly uncle who lives w/ them, and his 91 2o mother w/ cancer now on hospice;  Due to the stress he  started smoking again 06/2014 up to 1ppd but had his last cigarette ~2wks ago- says he has quit for good now;  He is c/o cough, mild chest congestion, small amt beige sputum w/o hemoptysis; he notes occas wheezing but denies SOB, CP, palpit, ch in edema- he is still able to mow yard, walking, does chores/ shopping etc w/o giving out. W/o SOB, etc;  He notes that mucinex helps the congestion...    EXAM shows Afeb, VSS, O2sat=97% on RA;  HEENT- neg, mallampati2;  Chest- clear, sl decr BS at bases, congested cough, w/o w/r/r;  Heart- RR gr1/6SEM w/o r/g;  Abd- obese, soft, neg;  Ext- +VI otherw neg w/o c/c/e...   Last CXR 09/24/15 showed norm heart size, linear atx vs scarring at left base, otherw clear, NAD...   Spirometry 10/08/15 showed FVC=3.51 (69%), FEV1=2.68 (71%), %1sec=76%, mid-flows reduced at 64% predicted... This is suggestive of mild restrictive ventilatory defect w/ superimposed mild small airways dis...   Ambulatory oxygen saturation test 10/08/15> O2sat=97%  on RA at rest w/ pulse=72/min; he ambulated 3 laps in the office w/ lowest O2sat=92% w/ pulse=99/min...   LABS 09/2015>  Chems- wnl x BS=149, A1c=6.3;  CBC- wnl w/ Hg=14.3, WBC=5.4 & 8%eos;  TSH=1.14;  PSA=0.44 IMP/PLAN>>  Jason [name] has a min smoking hx but restarted ~80yrago, then quit again 2 wks ago; c/o cough & congestion w/ some intermittent wheezing c/w chr bronchitis; his Spirometry show some mild restriction suspected and superimposed small airways dis;  Now that he has quit smoking he does not want to get on a regular inhaler if not absolutely needed, so we decided to treat w/ PROAIR-HFA 1-2 sp Q6H prn + MUCINEX 6031m 1-2Bid w/ fluids;  If he does not improve rapidly/ resolve- then we could prescribe an ICS/LABA or use parenteral therapy if needed... We discussed an ROV in 2-14m5moooner if needed.  ~  January 01, 2016:  14mo51mo w/ SN>   Since Jason [name] was here last he has had 2 Hospitalizations:    1st Hosp was 12/21 - 11/07/15 by [facility]>  He had just been to the ER w/ bronchitis & treated w/ ZPak & Tussionex;  Presented w/ 2wk hx cough, thick yellow sput, wheezing, SOB, chest tightness;  Found to have CAP-nos, CXR showed mild cardiomeg, chr LLL scarring & superimposed LLL infiltrate; Flu panel- neg, CBC was wnl w/ 7K WBC; Chems- wnl x BS17-220 range;  Treated w/ Levaquin, NEBS, & Solumedrol=> Pred taper...    2nd Hosp was 1/1 - 11/19/15 by [facility]>  Presented to Er w/ recurrent wheezing, chest congestion, SOB & Adm w/ a "COPD exac" & O2sat said to be in the mid-80's on RA w/ ambulation in ER; No cultures done; CXR showed stable left basilar scarring, NAD;  LABS showed elev WBC=7K on Adm 7 incr to 16K w/ Solumedrol rx, Chems- wnl x BS up to 400 w/ steroids, BNP=46, A1c=8.5;  He was treated w/ more Levaquin, Solumedrol=> Pred taper, NEBS, Mucinex, etc...    Since disch he has followed up w/ DrJones x2, [facility], & went back to the ER 12/22/15 w/ hyperglycemia;  We reviewed the following medical problems during  today's office visit >>     Restrictive lung disease> due to obesity, chest wall factors, & anxiety; we reviewed diet, exercise, wt reduction strategies + offered anxiolytic rx but he declines...     Hx asthmatic bronchitis/ pneumonia> on Symbicort 2spQd, NEBS w/ Albut vs HFA inhaler prn; notes sl cough, denies dyspnea;  rec to incr Symbicort80-2spBid & use the NEBS Bid for now...    Ex-smoker> he has a min smoking hx ~10 pack-yrs total & quit in 0947; he inexplicably took it up again in 2016 but then quit again later that yr...     R/O OSA>  Pt says that his wife notes apneas while he sleeps but not c/o snoring; he has very poor sleep hygiene due to elderly uncle living w/ them; he rests poorly he says but often wakes refreshed; does note sleep pressure during the day & naps 3-4days per wk; his ESS= 18/24 and we will order a split night sleep test...     MVP>  On ASA daily & 2DEcho 10/11 showed mild LVH, norm LVF w/ EF= 55-60%, ant leaflet MVP seen w/ MR, mod LAdil; he denies CP, palpit, SOB, ch in edema...    Ven Insuffic>  Hx VV, VI, stasis changes w/ ulcer/ cellulitis in past; s/p laser to left GSV by DrEarly & s/p right 2nd toe amp for osteomyelitis by DrBednarz 2010.    Chol>  On Simva40 and FLP 11/16 shows TChol 134, TG 69, HDL 42, LDL 78... keep same dose + better diet & get wt down!    DM>  On Metformin 500Bid & Glimep24mQam; BS at home are improved & Labs here 1/17 showed BS= 150-300, A1c= 8.5; encouraged to get on diet, get wt down, f/u w/ PCP...    Obesity>  weight is down to 311# today; we reviewed the critical need for wt reducting diet + exercise program...    GI>  Adm 9/12 w/ SBO & had ELap w/ lysis of adhesions;  Now back to baseline w/o abd pain, N/V/D/C/ or blood seen...    GU>  He saw DrTannenbaum et al 3/12 (we don't have more recent notes) for f/u BPH, ED, & PSA recheck;  Voiding satis, prev on Cialis & PSAs have been good...    DJD/ LBP>  Hx LBP w/ walking, no radiation;  Hx right  foot problems w/ amp 1st 3 toes now for osteomyelitis ([facility]iat[facility]...    Anxiety>  He related difficulty caring for family esp elderly mother who is quite demanding, then an uncle, & conflict w/ his daughter/ her 2 kids/ etc... EXAM shows Afeb, VSS, O2sat=97% on RA;  HEENT- neg, mallamapti1;  Chest decr BS, clear, Gr2/6sys murmur w/o r/g;  Abd- obeses soft, neg;  Ext- VI, stasis changes, chr edema bilat; Neuro- neuropathy w/o focal motor abd...  CXR 11/2015 showed mild cardiomeg, chr LLL scarring & superimposed LLL infiltrate=> improved   Spirometry 01/01/16> FVC=3.76 (74%), FEV1=2.84 (75%), %1sec=76, mid-flows sl reduced at 64% predicted;  This is c/w mild restrictive ventilatory defect & superimposed small airways dis...  Ambulatory oximetry 01/01/16> O2sat=97% on RA at rest w/ pulse=72/min;  He ambulated 3 laps in office w/ lowest O2sat=90% w/ heart rate 100/min IMP/PLAN>>  As above- he has a min smoking hx and evid of Restrictive lung dis at baseline; recent bronchitis=> airway inflamm & [place]nos[place] getting back to baseline 7 rec that he use the Symbicort80-2spBid for the airway inflamm & to keep him off Pred;  OK to use NEBS, rescue inhaler as needed;  We will order a split night sleep test & a f/u 2DEcho...  ADDENDUM> Sleep Study 01/09/16> severe OSA w/ AHI=59.4/hr, REM AHI=68.2/hr, Min O2sat=77%, Mean=92%, Time <88%=11.456m, NSR- no arrhythmias, no signif PLMS;  Split night study showed good control on CPAP=13 (few central  apneas noted);  REC> large [employer] full face Simplus mask, Res Med S10 machine Air/Auto w/ heated humidity, climate controlled tubing, Auto CPAP 5-15, enroll in [employer];  He needs ROV w/ download in 6-8 weeks... ADDENDUM> 2DEcho 01/14/16> mild conc LVH, norm sys function & no regional wall motion abnormalities, AoV mod thickened & calcif leaflets w/ restricted mobility, MV leaflets mod thicked & calcif w/ reduced separation c/w mild-mod  stenosis & mild MR, mod LA dil (67m), RV norm w/ norm sys function/ RA normal/ PAsys wnl; we will refer to CARDS...   ~  February 12, 2016:  6wk ROV & JGiovannyreports that his breathing is improved on SYMBICORT160-2spBid & Albut via NEB or HFA prn;  In addition he had his sleep study 01/09/16 w/ severe OSA as above & we set him up w/ CPAP Auto 5-15 and he has been using it for the last 2wks now w/ download indicating that he's using it ~8H/night w/ good control & AHI down to 5/hr;  States he's resting OK but still wakes tired, no change in daytime alertness, still naps on occas, he fells he's improving...     He saw CARDS, DrHochrein, 01/23/16> Miral valve dis (mild MS) and hx rheumatic fever as a child; he is relatively asymptomatic- no change in meds, encouraged diet, exercise, wt reduction; f/u planned 114yr. EXAM shows Afeb, VSS, O2sat=97% on RA;  HEENT- neg, mallamapti1;  Chest decr BS, clear, Gr2/6sys murmur w/o r/g;  Abd- obeses soft, neg;  Ext- VI, stasis changes, chr edema bilat; Neuro- neuropathy w/o focal motor abd... IMP/PLAN>>  We discussed his situation as the care giver for his family (elderly great uncle, and his wife's care needs);  Continue Symbicort, albuterol rescue prn, CPAP nightly, plus his Simva40/ Metform/ Glimep...  We plan ROV 61m28mo a full download of CPAP data...   ~  Apr 13, 2016:  61mo41mo & pulmonary f/u visit>   JennKhaliqtes thathe is doing well- using CPAP every night, all night long, wakes 1-2x & able to replace his mask & get back to sleep, mask fit is ok, no pressure issues & tells me wife is pleased since he is not snoring now; he note less daytime sleepiness & no prob driving... Similarly he denies cough, sput, hemoptysis, SOB, CP, etc... Insurance changed his symbicort to [employer] CPAP download> 3/26 - 04/07/16 showed good compliance (60/60 days, 8.5H per night), Autoset 5-15 w/ median press=8 & max=14, AHI=5, he has a mod intermittent leak IMP/PLAN>>  See prob list>>   OSA>  wife noted apneas & some snoring; he has very poor sleep hygiene due to elderly uncle living w/ them; he rests poorly he says but often wakes refreshed; does note sleep pressure during the day & naps 3-4days per wk; his ESS= 18/24 and PSG 12/2015 showing severe OSA w/ AHI=59.4/hr, REM AHI=68.2/hr, Min O2sat=77%, Mean=92%, Time <88%=11.4min361mSR- no arrhythmias, no signif PLMS => placed on CPAP Auto 5-15 w/ excellenmt compliance & good response w/ clinical improvement...  ~  October 14, 2016:  47mo R261mo pulmonary follow up vsit>  JenninDaikits quite a bit of stress at home which we discussed & offered anxiolytic medication but he again declines; support given;  We reviewed the following medical problems during today's office visit >>     Restrictive lung disease> due to obesity, chest wall factors, & anxiety; we reviewed diet, exercise, wt reduction strategies + offered anxiolytic rx but he declines...     Hx  asthmatic bronchitis/ pneumonia> on BreoQd, NEBS w/ Albut vs HFA inhaler prn (hasn't needed); notes sl cough, denies dyspnea/ CP/ etc...    Ex-smoker> he has a min smoking hx ~10 pack-yrs total & quit in 6599; he inexplicably took it up again in 2016 but then quit again later that yr...     OSA>  wife noted apneas & some snoring; he has very poor sleep hygiene due to elderly uncle living w/ them; he rests poorly he says but often wakes refreshed; does note sleep pressure during the day & naps 3-4days per wk; his ESS= 18/24 and PSG 12/2015 showing severe OSA w/ AHI=59.4/hr, REM AHI=68.2/hr, Min O2sat=77%, Mean=92%, Time <88%=11.40mn, NSR- no arrhythmias, no signif PLMS => placed on CPAP Auto 5-15 w/ excellenmt compliance & good response w/ clinical improvement...    CARDIAC issues>  Followed by DrHochrein w/ valvular heart dis- calf & restricted AoV & mild-mod MS/MS (remote hx rheum fever), not requiring meds, just on ASA81    MEDICAL issues>  VI, HL, DM, Obesity, hx SBO w/ Elap, BPH/ ED, DJD/  LBP, anxiety-- followed by DrTJones on ASA, Simva40, Metform500-2/d & Glimep2; Last FLP 11/16 at goals and last A1c 6/17 was 7.2..Marland KitchenMarland KitchenEXAM shows Afeb, VSS, Wt=309#, O2sat=99% on RA;  HEENT- neg, mallamapti1;  Chest decr BS, clear, Gr2/6sys murmur w/o r/g;  Abd- obeses soft, neg;  Ext- VI, stasis changes, chr edema bilat; Neuro- neuropathy w/o focal ch.  CXR 10/14/16>  Norm heart size, no adenopathy, clear lungs x mild scarring at left lung base, no effusion...  CPAP download> 10/31-11/29/17> excellent compliance (30/30 days, 8+hrs/night), on Auto5-15 w/ pressuremedian=8 & max=14, AHI=3, intermit leak... IMP/PLAN>>  Pt is stable on his CPAP & Breo; stress at home w/ wife- pt declines anxiolytic therapy; discussed diet/ exercise/ wt reduction strategies...            Problem List:     His PCP is DrTJones, [place]...   Obstructive Sleep Apnea >>  ~  See Sleep Study 01/09/16 w/ AHI=59 and split night test showed good control on CPAP=13;  started on CPAP auto 5-15 and tolerating well so far... 5/17>  CPAP download showed excellent compliance & marked improveent in his OSA...  AB/ Pulmonary Restriction/ Hx Pneumonia >> seen in ER 7/09 w/ faint RLL infiltrate that cleared serially w/ antibiotic Rx... ~  CXR 9/13 showed norm heart size, clear lungs w/ mild left basilar scarring & mild degen changes in Tspine... ~  [facility]/ scarring; treated w/ Levaquin, Pred, Mucinex, etc...  ~  AB resolved and stable on SYMBICORT 160-2spbid + prn albuterol NEBS or MDI...  MITRAL VALVE DISEASE >> on ASA 861md... he denies CP, palpit, dyspnea... exam w/ MR murmur... ~  2DEcho 5/01 showed mild ant leaflet MVP, mild MR, norm LVF. ~  f/u 2DEcho 10/11 showed mild LVH, norm LVF w/ EF= 55-60%, ant leaflet MVP seen w/ MR, mod LAdil ~  EKG 9/12 showed NSR, rate80, NSSTTWA ~  EKG 9/13 showed NSR, rate 79, NSSTTWA... ~  EKG 11/2015 showed NSR w/ PACs, rate71, 1st degree AVB, incr voltage (r/o LVH),  otherw wnl... ~  2DEcho 01/14/16 showed mild conc LVH & mild MS, he has hx rheumatic fever at age [age]. ~  [facility] eval by DrHochrein 01/2016> mild MS, he is asymptomatic & they plan f/u 1y56yr  VENOUS INSUFFICIENCY (ICD-459.81) - he knows to elim salt, elevate legs, wear TED's... ~  neg ven dopplers in 1996...  ~  he saw [facility] for a ven stasis ulcer on his leg... rec for TED's, no salt, elevation, etc...  ~  he had normal ABI's in 10/06... ~  3/10:  left leg wound w/ cellulitis & +MRSA... Rx'd and refer back to wound care clinic. ~  7/10:  sched for right second toe amputation due to osteomyelitis despite hyperbaric O2 etc... ~  8/10: s/p right second toe amp for osteomyelitis [facility] Cord), and s/p laser ablation of left greater saphenous vein (DrEarly). ~  7/13:  He tells me that DrTuckman, Podiatrist has been treating his right great toe (ulcer on plantar surface)- we will call for notes. ~  He subsequently had 2 more toes amputated by DrDuda; subseq well healed & doing satis... ~  Chronic VI & stasis changes w/ chr edema...  HYPERCHOLESTEROLEMIA (ICD-272.0) - on SIMVASTATIN 67m Qhs...  DIABETES MELLITUS (ICD-250.00) - on METFORMIN 5069mBid + GLIMEPIRIDE 86m27m, and diet efforts...  OBESITY (ICD-278.00) - he is very large at 6' 8" tall and >300# for the last 10+ yrs... peak = 340#... he drinks beer daily... discussed diet + exercise program (and no beer)... ~  he had transient right gynecomastia 9/09, no discharge, neg mammogram & resolved on it's own...  GERD (ICD-530.81) >> not currently taking a PPI med...  DIVERTICULOSIS OF COLON (ICD-562.10) / IRRITABLE BOWEL SYNDROME (ICD-564.1) & COLONIC POLYPS (ICD-211.3) ~  colonoscopy 3/06 by DrStark showing divertics & 2-3mm63mlyps (adenomas)... ~  colonoscopy 9/11 by DrStark showed divertics & 4mm 51myp= tubular adenoma... f/u planned 5yrs.67yrcolonoscopy 7/16 by DrStark showed mild diertics, int hems, 5 polyps 4-6mm si34m& Bx= tubular  adenomas and one serrated polyp, no atypia...  BENIGN PROSTATIC HYPERTROPHY & ED - eval by DrTannenbaum 7/08 and Rx w/ Cialis; he has tried the PEP shots; + fam hx prostate ca in his father... ~  8/10:  DrTannenbaum did circ for phimosis, pt states that PSA check was OK... ~  3/12:  f/u BPH, ED, & PSA recheck;  Voiding satis, given Cialis & PSA= 0.47 (he had 58cc PVR- not on meds at present).  DEGENERATIVE JOINT DISEASE (ICD-715.90) - he uses OTC meds Prn, and was rec to start Vit supplements by DrTannenbaum- takes MVI, VitC, VitD... he has seen DrHilts in the past... ~  He has had the 1st 3 toes of right foot amputated...  Hx of HIDRADENITIS (ICD-705.83)  ANXIETY>  He related difficulty caring for family esp elderly mother who is quite demanding & conflict w/ his daughter/ her 2 kids/ etc...  Health Maintenance - up to date on colon & prostate screens... PNEUMOVAX 9/09... TETANUS booster 3/10... gets yearly Flu vaccines in Fall.   Past Surgical History:  Procedure Laterality Date  . AMPUTATION  08/09/2012   Procedure: AMPUTATION DIGIT;  Surgeon: Marcus Newt MinionLocation: [facility] OR; [place]: Orthopedics;  Laterality: Right;  Amputation right Great Toe MTP Joint  . AMPUTATION  10/17/2012   Procedure: AMPUTATION DIGIT;  Surgeon: Marcus Newt MinionLocation: [facility] OR; [place]: Orthopedics;  Laterality: Right;  Right Foot 3rd Toe Amputation at Metatarsophalangeal Joint  . CIRCUMCISION  06/2100   For Phimosis - Dr TannebaHartley BarefootON SURGERY    . KNEE ARTHROSCOPY    . LAPAROTOMY  07/20/11  . LASER ABLATION  06/2009   Left Greater Saphenous vein -Dr Early  Donnetta Hutching AMPUTATION  06/2009   x3Right foot second toe -Dr BednarzBeola CordSILLECTOMY  Outpatient Encounter Prescriptions as of 10/14/2016  Medication Sig  . albuterol (PROVENTIL HFA;VENTOLIN HFA) 108 (90 BASE) MCG/ACT inhaler Inhale 1-2 puffs into the lungs every 6 (six) hours as needed for wheezing or shortness of breath.  Marland Kitchen albuterol  (PROVENTIL) (2.5 MG/3ML) 0.083% nebulizer solution Take 3 mLs (2.5 mg total) by nebulization every 4 (four) hours as needed for wheezing.  Marland Kitchen aspirin EC 81 MG tablet Take 1 tablet (81 mg total) by mouth daily.  . blood glucose meter kit and supplies KIT Use to test blood sugar up to 3 times daily. DX E11.9  . Blood Glucose Monitoring Suppl (TRUE METRIX METER) w/Device KIT Use to check blood sugar DX E11.8  . fluticasone furoate-vilanterol (BREO ELLIPTA) 200-25 MCG/INH AEPB Inhale 1 puff into the lungs daily.  Marland Kitchen glimepiride (AMARYL) 2 MG tablet Take 1 tablet (2 mg total) by mouth daily with breakfast.  . glucose blood (COOL BLOOD GLUCOSE TEST STRIPS) test strip Use to test blood sugar up to 3 times daily. DX E11.9  . metFORMIN (GLUCOPHAGE) 500 MG tablet Take 1 tablet (500 mg total) by mouth 2 (two) times daily with a meal.  . simvastatin (ZOCOR) 40 MG tablet TAKE 1 TABLET EVERY EVENING  . TRUEPLUS LANCETS 33G MISC USE  TO TEST BLOOD SUGAR  UP  TO THREE TIMES DAILY  . vitamin C (ASCORBIC ACID) 500 MG tablet Take 1 tablet (500 mg total) by mouth daily.  . fluticasone furoate-vilanterol (BREO ELLIPTA) 100-25 MCG/INH AEPB Inhale 1 puff into the lungs daily.   No facility-administered encounter medications on file as of 10/14/2016.     No Known Allergies   Current Medications, Allergies, Past Medical History, Past Surgical History, Family History, and Social History were reviewed in [employer] record.    Review of Systems         See HPI - all other systems neg except as noted> states no  recent cough, chest congestion, etc... The patient denies anorexia, fever, weight loss, weight gain, vision loss, decreased hearing, hoarseness, chest pain, syncope, dyspnea on exertion, peripheral edema, headaches, hemoptysis, abdominal pain, melena, hematochezia, severe indigestion/heartburn, hematuria, incontinence, muscle weakness, suspicious skin lesions, transient blindness,  difficulty walking, depression, unusual weight change, abnormal bleeding, enlarged lymph nodes, and angioedema.     Objective:   Physical Exam     WD, Overweight, very large 75 y/o BM in NAD... GENERAL:  Alert & oriented; pleasant & cooperative. HEENT:  [place]/AT, EOM-wnl, PERRLA, EACs-clear, TMs-wnl, NOSE-clear, THROAT-clear & wnl (mallamapti2) NECK:  Supple w/ fairROM; no JVD; normal carotid impulses w/o bruits; no thyromegaly or nodules palpated; no lymphadenopathy. CHEST:  Clear to P & A; without wheezes/ rales/ or rhonchi heard, no consolidation... HEART:  Regular Rhythm; gr 1-2/6 SEM without rubs or gallops detected... ABDOMEN:  Soft & nontender; normal bowel sounds; no organomegaly or masses detected. EXT: without deformities, mod arthritic changes; wearing TEDs +varicose veins/ +venous insuffic/ chr edema... NEURO:  CN's intact;  no focal neuro deficits... Derm:  Mild stasis changes in LEs w/o cellulitis etc...  RADIOLOGY DATA:  Reviewed in the EPIC EMR & discussed w/ the patient...  LABORATORY DATA:  Reviewed in the EPIC EMR & discussed w/ the patient...   Assessment & Plan:       1) Restrictive lung disease> due to obesity, chest wall factors, & anxiety; we reviewed diet, exercise, wt reduction strategies + offered anxiolytic rx but he declines.Marland KitchenMarland Kitchen     2) Hx asthmatic bronchitis/ pneumonia> on Symbicort  2spQd, NEBS w/ Albut vs HFA inhaler prn; notes sl cough, denies dyspnea; rec to incr Symbicort80-2spBid & use the NEBS Bid for now...    3) Ex-smoker> he has a min smoking hx ~10 pack-yrs total & quit in 8144; he inexplicably took it up again in 2016 but then quit again later that yr...     4) OSA>  wife noted apneas & some snoring; he has very poor sleep hygiene due to elderly uncle living w/ them; he rests poorly he says but often wakes refreshed; does note sleep pressure during the day & naps 3-4days per wk; his ESS= 18/24 and PSG 12/2015 showing severe OSA w/ AHI=59.4/hr, REM  AHI=68.2/hr, Min O2sat=77%, Mean=92%, Time <88%=11.47mn, NSR- no arrhythmias, no signif PLMS => placed on CPAP Auto 5-15 w/ excellenmt compliance & good response w/ clinical improvement...   MEDICAL ISSUES per DrTJones>>     HxMVP=> 2DEcho w/ mild MS>  Mild MVP on 2DEcho 2011 & he denies CP, palpit, ch in SOB, edema, etc; repeat Echo 2017 w/ mild MS & he was seen by DrHochrein for CARDS.    Ven Insuffic>  He had vein surg from DrEarly; knows to elim sodium, elevate legs, wear support hose, etc...    Chol>  On Simva40 + diet;  FLP looks good...    DM>  Controlled on Metformin & Glimepiride; he needs better diet, incr exercise, get wt down...       Diabetic foot ulcer> he lost right 2nd toe to osteomyelitis in 2010; now w/ ulcer on plantar aspect great toe> under the care of Podiatry=> DrDuda had to amputate 2 toes 9/13...    Obesity>  As noted, wt down to 310# after his hosp for SBO; then right back up to 329# after the holidays...    GI> s/p SBO w/ surg, Hx GERD/ Divertics/ IBS/ Polyps> GI per DrStark & post-op DrBlackman...    GU> BPH & ED followed by DrTannenbaum...    DJD> on OTC analgesics as needed...    ANXIETY>  We discussed coping strategies for the family stress...  PLAN>>   01/01/16>   As above- he has a min smoking hx and evid of Restrictive lung dis at baseline; recent bronchitis=> airway inflamm & pneumonia (nos) [place] now getting back to baseline 7 rec that he use the Symbicort80-2spBid for the airway inflamm & to keep him off Pred;  OK to use NEBS, rescue inhaler as needed;  We will order a split night sleep test & a f/u 2DEcho... 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  We plan ROV 271mo/ a full download of CPAP data 10/14/16>   Pt is stable on his CPAP & Breo; stress at home w/ wife- pt declines  anxiolytic therapy; discussed diet/ exercise/ wt reduction strategies...    Patient's Medications  New Prescriptions   FLUTICASONE FUROATE-VILANTEROL (BREO ELLIPTA) 100-25 MCG/INH AEPB    Inhale 1 puff into the lungs daily.  Previous Medications   ALBUTEROL (PROVENTIL HFA;VENTOLIN HFA) 108 (90 BASE) MCG/ACT INHALER    Inhale 1-2 puffs into the lungs every 6 (six) hours as needed for wheezing or shortness of breath.   ALBUTEROL (PROVENTIL) (2.5 MG/3ML) 0.083% NEBULIZER SOLUTION    Take 3 mLs (2.5 mg total) by nebulization every 4 (four) hours as needed for wheezing.  ASPIRIN EC 81 MG TABLET    Take 1 tablet (81 mg total) by mouth daily.   BLOOD GLUCOSE METER KIT AND SUPPLIES KIT    Use to test blood sugar up to 3 times daily. DX E11.9   BLOOD GLUCOSE MONITORING SUPPL (TRUE METRIX METER) W/DEVICE KIT    Use to check blood sugar DX E11.8   FLUTICASONE FUROATE-VILANTEROL (BREO ELLIPTA) 200-25 MCG/INH AEPB    Inhale 1 puff into the lungs daily.   GLIMEPIRIDE (AMARYL) 2 MG TABLET    Take 1 tablet (2 mg total) by mouth daily with breakfast.   GLUCOSE BLOOD (COOL BLOOD GLUCOSE TEST STRIPS) TEST STRIP    Use to test blood sugar up to 3 times daily. DX E11.9   METFORMIN (GLUCOPHAGE) 500 MG TABLET    Take 1 tablet (500 mg total) by mouth 2 (two) times daily with a meal.   SIMVASTATIN (ZOCOR) 40 MG TABLET    TAKE 1 TABLET EVERY EVENING   TRUEPLUS LANCETS 33G MISC    USE  TO TEST BLOOD SUGAR  UP  TO THREE TIMES DAILY   VITAMIN C (ASCORBIC ACID) 500 MG TABLET    Take 1 tablet (500 mg total) by mouth daily.  Modified Medications   No medications on file  Discontinued Medications   No medications on file

## 2016-11-19 DIAGNOSIS — J441 Chronic obstructive pulmonary disease with (acute) exacerbation: Secondary | ICD-10-CM | POA: Diagnosis not present

## 2016-11-29 DIAGNOSIS — J441 Chronic obstructive pulmonary disease with (acute) exacerbation: Secondary | ICD-10-CM | POA: Diagnosis not present

## 2016-11-29 DIAGNOSIS — G4733 Obstructive sleep apnea (adult) (pediatric): Secondary | ICD-10-CM | POA: Diagnosis not present

## 2016-12-20 DIAGNOSIS — J441 Chronic obstructive pulmonary disease with (acute) exacerbation: Secondary | ICD-10-CM | POA: Diagnosis not present

## 2016-12-30 DIAGNOSIS — G4733 Obstructive sleep apnea (adult) (pediatric): Secondary | ICD-10-CM | POA: Diagnosis not present

## 2016-12-30 DIAGNOSIS — J441 Chronic obstructive pulmonary disease with (acute) exacerbation: Secondary | ICD-10-CM | POA: Diagnosis not present

## 2017-01-10 ENCOUNTER — Other Ambulatory Visit: Payer: Self-pay | Admitting: Internal Medicine

## 2017-01-17 ENCOUNTER — Other Ambulatory Visit (INDEPENDENT_AMBULATORY_CARE_PROVIDER_SITE_OTHER): Payer: Medicare HMO

## 2017-01-17 ENCOUNTER — Ambulatory Visit (INDEPENDENT_AMBULATORY_CARE_PROVIDER_SITE_OTHER): Payer: Medicare HMO | Admitting: Internal Medicine

## 2017-01-17 ENCOUNTER — Encounter: Payer: Self-pay | Admitting: Internal Medicine

## 2017-01-17 VITALS — BP 110/68 | HR 85 | Temp 98.6°F | Resp 16 | Ht >= 80 in | Wt 309.8 lb

## 2017-01-17 DIAGNOSIS — I70209 Unspecified atherosclerosis of native arteries of extremities, unspecified extremity: Secondary | ICD-10-CM | POA: Diagnosis not present

## 2017-01-17 DIAGNOSIS — IMO0002 Reserved for concepts with insufficient information to code with codable children: Secondary | ICD-10-CM

## 2017-01-17 DIAGNOSIS — E785 Hyperlipidemia, unspecified: Secondary | ICD-10-CM

## 2017-01-17 DIAGNOSIS — R319 Hematuria, unspecified: Secondary | ICD-10-CM

## 2017-01-17 DIAGNOSIS — E118 Type 2 diabetes mellitus with unspecified complications: Secondary | ICD-10-CM | POA: Diagnosis not present

## 2017-01-17 DIAGNOSIS — L602 Onychogryphosis: Secondary | ICD-10-CM

## 2017-01-17 DIAGNOSIS — E1151 Type 2 diabetes mellitus with diabetic peripheral angiopathy without gangrene: Secondary | ICD-10-CM | POA: Diagnosis not present

## 2017-01-17 DIAGNOSIS — E1165 Type 2 diabetes mellitus with hyperglycemia: Secondary | ICD-10-CM | POA: Diagnosis not present

## 2017-01-17 DIAGNOSIS — J441 Chronic obstructive pulmonary disease with (acute) exacerbation: Secondary | ICD-10-CM | POA: Diagnosis not present

## 2017-01-17 DIAGNOSIS — L97519 Non-pressure chronic ulcer of other part of right foot with unspecified severity: Secondary | ICD-10-CM

## 2017-01-17 LAB — CBC WITH DIFFERENTIAL/PLATELET
BASOS ABS: 0 10*3/uL (ref 0.0–0.1)
Basophils Relative: 0.9 % (ref 0.0–3.0)
EOS PCT: 11.6 % — AB (ref 0.0–5.0)
Eosinophils Absolute: 0.5 10*3/uL (ref 0.0–0.7)
HEMATOCRIT: 41.9 % (ref 39.0–52.0)
HEMOGLOBIN: 13.9 g/dL (ref 13.0–17.0)
LYMPHS PCT: 23.9 % (ref 12.0–46.0)
Lymphs Abs: 1 10*3/uL (ref 0.7–4.0)
MCHC: 33.1 g/dL (ref 30.0–36.0)
MCV: 94.6 fl (ref 78.0–100.0)
MONOS PCT: 8.5 % (ref 3.0–12.0)
Monocytes Absolute: 0.4 10*3/uL (ref 0.1–1.0)
NEUTROS PCT: 55.1 % (ref 43.0–77.0)
Neutro Abs: 2.4 10*3/uL (ref 1.4–7.7)
Platelets: 150 10*3/uL (ref 150.0–400.0)
RBC: 4.43 Mil/uL (ref 4.22–5.81)
RDW: 12.1 % (ref 11.5–15.5)
WBC: 4.4 10*3/uL (ref 4.0–10.5)

## 2017-01-17 LAB — URINALYSIS, ROUTINE W REFLEX MICROSCOPIC
BILIRUBIN URINE: NEGATIVE
KETONES UR: NEGATIVE
Leukocytes, UA: NEGATIVE
NITRITE: NEGATIVE
PH: 5.5 (ref 5.0–8.0)
Specific Gravity, Urine: 1.025 (ref 1.000–1.030)
Total Protein, Urine: NEGATIVE
Urine Glucose: NEGATIVE
Urobilinogen, UA: 1 (ref 0.0–1.0)

## 2017-01-17 LAB — COMPREHENSIVE METABOLIC PANEL
ALBUMIN: 4.2 g/dL (ref 3.5–5.2)
ALK PHOS: 42 U/L (ref 39–117)
ALT: 17 U/L (ref 0–53)
AST: 14 U/L (ref 0–37)
BILIRUBIN TOTAL: 0.8 mg/dL (ref 0.2–1.2)
BUN: 12 mg/dL (ref 6–23)
CALCIUM: 9 mg/dL (ref 8.4–10.5)
CO2: 29 mEq/L (ref 19–32)
Chloride: 105 mEq/L (ref 96–112)
Creatinine, Ser: 0.87 mg/dL (ref 0.40–1.50)
GFR: 110.06 mL/min (ref >60.00)
GLUCOSE: 202 mg/dL — AB (ref 70–99)
POTASSIUM: 4.3 meq/L (ref 3.5–5.1)
Sodium: 140 mEq/L (ref 135–145)
TOTAL PROTEIN: 7 g/dL (ref 6.0–8.3)

## 2017-01-17 LAB — TSH: TSH: 1.31 u[IU]/mL (ref 0.35–4.50)

## 2017-01-17 LAB — LIPID PANEL
Cholesterol: 136 mg/dL (ref 0–200)
HDL: 45.8 mg/dL (ref >39.00)
LDL Cholesterol: 79 mg/dL (ref 0–99)
NONHDL: 90.27
TRIGLYCERIDES: 57 mg/dL (ref 0.0–149.0)
Total CHOL/HDL Ratio: 3
VLDL: 11.4 mg/dL (ref 0.0–40.0)

## 2017-01-17 LAB — MICROALBUMIN / CREATININE URINE RATIO
CREATININE, U: 159.1 mg/dL
Microalb Creat Ratio: 1.8 mg/g (ref 0.0–30.0)
Microalb, Ur: 2.9 mg/dL — ABNORMAL HIGH (ref 0.0–1.9)

## 2017-01-17 LAB — HEMOGLOBIN A1C: Hgb A1c MFr Bld: 7.4 % — ABNORMAL HIGH (ref 4.6–6.5)

## 2017-01-17 NOTE — Patient Instructions (Signed)

## 2017-01-17 NOTE — Progress Notes (Signed)
Subjective:  Patient ID: Jason [name], male    DOB: 05-Dec-1940  Age: 76 y.o. MRN: [id number]  CC: Medication Refill (DM and thyroid); Diabetes; and Hyperlipidemia   HPI Jason [name] presents for f/up on multiple medical concerns. He has concerns about his feet. There is an ulcer on the plantar side of the right foot and he recently traumatized his left great toe trying to clip his toenails. He doesn't know if his blood sugars are well-controlled because he doesn't check them. Fortunately, he has had no symptoms recently.  Outpatient Medications Prior to Visit  Medication Sig Dispense Refill  . albuterol (PROVENTIL HFA;VENTOLIN HFA) 108 (90 BASE) MCG/ACT inhaler Inhale 1-2 puffs into the lungs every 6 (six) hours as needed for wheezing or shortness of breath. 3 Inhaler 2  . albuterol (PROVENTIL) (2.5 MG/3ML) 0.083% nebulizer solution Take 3 mLs (2.5 mg total) by nebulization every 4 (four) hours as needed for wheezing. 75 mL 12  . aspirin EC 81 MG tablet Take 1 tablet (81 mg total) by mouth daily. 90 tablet 3  . blood glucose meter kit and supplies KIT Use to test blood sugar up to 3 times daily. DX E11.9 1 each 0  . Blood Glucose Monitoring Suppl (TRUE METRIX METER) w/Device KIT Use to check blood sugar DX E11.8 1 kit 0  . fluticasone furoate-vilanterol (BREO ELLIPTA) 100-25 MCG/INH AEPB Inhale 1 puff into the lungs daily. 2 each 0  . glucose blood (COOL BLOOD GLUCOSE TEST STRIPS) test strip Use to test blood sugar up to 3 times daily. DX E11.9 300 each 3  . metFORMIN (GLUCOPHAGE) 500 MG tablet Take 1 tablet (500 mg total) by mouth 2 (two) times daily with a meal. 180 tablet 0  . simvastatin (ZOCOR) 40 MG tablet TAKE 1 TABLET EVERY EVENING 90 tablet 3  . TRUEPLUS LANCETS 33G MISC USE  TO TEST BLOOD SUGAR  UP  TO THREE TIMES DAILY 300 each 3  . vitamin C (ASCORBIC ACID) 500 MG tablet Take 1 tablet (500 mg total) by mouth daily. 90 tablet 3  . fluticasone furoate-vilanterol (BREO ELLIPTA)  200-25 MCG/INH AEPB Inhale 1 puff into the lungs daily. 30 each 11  . glimepiride (AMARYL) 2 MG tablet Take 1 tablet (2 mg total) by mouth daily with breakfast. 90 tablet 0   No facility-administered medications prior to visit.     ROS Review of Systems  Constitutional: Negative for activity change, appetite change, diaphoresis, fatigue and unexpected weight change.  HENT: Negative.  Negative for sinus pressure.   Eyes: Negative.   Respiratory: Negative.  Negative for cough, chest tightness, shortness of breath and wheezing.   Cardiovascular: Negative.  Negative for chest pain, palpitations and leg swelling.  Gastrointestinal: Negative for abdominal pain, constipation, diarrhea, nausea and vomiting.  Endocrine: Negative for polydipsia, polyphagia and polyuria.  Genitourinary: Negative.  Negative for decreased urine volume, difficulty urinating, dysuria, flank pain, frequency, hematuria, testicular pain and urgency.  Musculoskeletal: Negative.  Negative for back pain, myalgias and neck pain.  Skin: Negative.  Negative for color change and rash.  Allergic/Immunologic: Negative.   Neurological: Negative.  Negative for dizziness.  Hematological: Negative.  Negative for adenopathy. Does not bruise/bleed easily.  Psychiatric/Behavioral: Negative.     Objective:  BP 110/68   Pulse 85   Temp 98.6 F (37 C) (Oral)   Resp 16   Ht '6\' 8"'  (2.032 m)   Wt (!) 309 lb 12 oz (140.5 kg)   SpO2 98%  BMI 34.03 kg/m   BP Readings from Last 3 Encounters:  01/17/17 110/68  10/14/16 116/64  04/13/16 128/66    Wt Readings from Last 3 Encounters:  01/17/17 (!) 309 lb 12 oz (140.5 kg)  10/14/16 (!) 308 lb 6 oz (139.9 kg)  04/13/16 (!) 312 lb (141.5 kg)    Physical Exam  Constitutional: He is oriented to person, place, and time. No distress.  HENT:  Mouth/Throat: Oropharynx is clear and moist. No oropharyngeal exudate.  Eyes: Conjunctivae are normal. Right eye exhibits no discharge. Left eye  exhibits no discharge. No scleral icterus.  Neck: Normal range of motion. Neck supple. No JVD present. No tracheal deviation present. No thyromegaly present.  Cardiovascular: Normal rate, regular rhythm, normal heart sounds and intact distal pulses.  Exam reveals no gallop and no friction rub.   No murmur heard. Pulmonary/Chest: Effort normal and breath sounds normal. No stridor. No respiratory distress. He has no wheezes. He has no rales. He exhibits no tenderness.  Abdominal: Soft. Bowel sounds are normal. He exhibits no distension and no mass. There is no tenderness. There is no rebound and no guarding.  Musculoskeletal: Normal range of motion. He exhibits no edema, tenderness or deformity.  Lymphadenopathy:    He has no cervical adenopathy.  Neurological: He is oriented to person, place, and time.  Skin: Skin is warm and dry. No rash noted. He is not diaphoretic. No erythema. No pallor.  Vitals reviewed.   Lab Results  Component Value Date   WBC 4.4 01/17/2017   HGB 13.9 01/17/2017   HCT 41.9 01/17/2017   PLT 150.0 01/17/2017   GLUCOSE 202 (H) 01/17/2017   CHOL 136 01/17/2017   TRIG 57.0 01/17/2017   HDL 45.80 01/17/2017   LDLCALC 79 01/17/2017   ALT 17 01/17/2017   AST 14 01/17/2017   NA 140 01/17/2017   K 4.3 01/17/2017   CL 105 01/17/2017   CREATININE 0.87 01/17/2017   BUN 12 01/17/2017   CO2 29 01/17/2017   TSH 1.31 01/17/2017   PSA 0.44 09/24/2015   INR 1.05 10/17/2012   HGBA1C 7.4 (H) 01/17/2017   MICROALBUR 2.9 (H) 01/17/2017    Dg Chest 2 View  Result Date: 10/14/2016 CLINICAL DATA:  Routine. History of pneumonia and asthma. Ex smoker. EXAM: CHEST  2 VIEW COMPARISON:  11/18/2015 FINDINGS: Cardiac silhouette is normal in size and configuration. No mediastinal or hilar masses or convincing adenopathy. Mild scarring at the left lung base, stable. Lungs are otherwise clear. No pleural effusion or pneumothorax. Skeletal structures are intact. IMPRESSION: No active  cardiopulmonary disease. Electronically Signed   By: Lajean Manes M.D.   On: 10/14/2016 11:39    Assessment & Plan:   Jason [name] was seen today for medication refill, diabetes and hyperlipidemia.  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  Uncontrolled type 2 diabetes mellitus with complication, without long-term current use  of insulin ([place]) -     Comprehensive metabolic panel; Future -     CBC with Differential/Platelet; Future -     Hemoglobin A1c; Future -     Urinalysis, Routine w reflex microscopic; Future -     Microalbumin / creatinine urine ratio; Future -     Ambulatory referral to Ophthalmology -     glimepiride (AMARYL) 2 MG tablet; Take 1 tablet (2 mg total) by mouth daily with breakfast.  Hyperlipidemia with target LDL less than 70- he has achieved his LDL goal is doing well on the statin. -     Lipid panel; Future -     TSH; Future  Foot ulcer, right, with unspecified severity ([place]) -     Ambulatory referral to [place] toenails -     Ambulatory referral to [facility] -     Ambulatory referral to Podiatry  Hematuria, unspecified type- he has new onset, microscopic and painless hematuria. Will order a scan to screen for masses/stones in the kidneys and bladder. -     CT RENAL STONE STUDY; Future   I am having Mr. Heidenreich maintain his vitamin C, aspirin EC, albuterol, albuterol, simvastatin, blood glucose meter kit and  supplies, glucose blood, TRUE METRIX METER, TRUEPLUS LANCETS 33G, metFORMIN, fluticasone furoate-vilanterol, and glimepiride.  Meds ordered this encounter  Medications  . glimepiride (AMARYL) 2 MG tablet    Sig: Take 1 tablet (2 mg total) by mouth daily with breakfast.    Dispense:  90 tablet    Refill:  1     Follow-up: Return in about 3 months (around 04/19/2017).  Scarlette Calico, MD

## 2017-01-17 NOTE — Progress Notes (Signed)
Pre-visit discussion using our clinic review tool. No additional management support is needed unless otherwise documented below in the visit note.  

## 2017-01-18 ENCOUNTER — Other Ambulatory Visit: Payer: Self-pay | Admitting: Internal Medicine

## 2017-01-18 MED ORDER — GLIMEPIRIDE 2 MG PO TABS: 2.0000 mg | ORAL_TABLET | Freq: Every day | ORAL | 1 refills | Status: DC

## 2017-01-20 ENCOUNTER — Ambulatory Visit (INDEPENDENT_AMBULATORY_CARE_PROVIDER_SITE_OTHER)
Admission: RE | Admit: 2017-01-20 | Discharge: 2017-01-20 | Disposition: A | Discharge status: Home / Self Care | Payer: Medicare HMO | Source: Ambulatory Visit | Attending: Internal Medicine | Admitting: Internal Medicine

## 2017-01-20 DIAGNOSIS — R319 Hematuria, unspecified: Secondary | ICD-10-CM

## 2017-01-20 DIAGNOSIS — N200 Calculus of kidney: Secondary | ICD-10-CM | POA: Diagnosis not present

## 2017-01-26 ENCOUNTER — Ambulatory Visit (INDEPENDENT_AMBULATORY_CARE_PROVIDER_SITE_OTHER): Payer: Medicare HMO | Admitting: Podiatry

## 2017-01-26 DIAGNOSIS — B351 Tinea unguium: Secondary | ICD-10-CM | POA: Diagnosis not present

## 2017-01-26 DIAGNOSIS — M79609 Pain in unspecified limb: Secondary | ICD-10-CM

## 2017-01-26 DIAGNOSIS — L84 Corns and callosities: Secondary | ICD-10-CM

## 2017-01-26 DIAGNOSIS — Q828 Other specified congenital malformations of skin: Secondary | ICD-10-CM

## 2017-01-26 DIAGNOSIS — E0843 Diabetes mellitus due to underlying condition with diabetic autonomic (poly)neuropathy: Secondary | ICD-10-CM | POA: Diagnosis not present

## 2017-01-26 DIAGNOSIS — L603 Nail dystrophy: Secondary | ICD-10-CM

## 2017-01-26 DIAGNOSIS — L608 Other nail disorders: Secondary | ICD-10-CM | POA: Diagnosis not present

## 2017-01-26 NOTE — Progress Notes (Signed)
   SUBJECTIVE Patient with a history of diabetes mellitus presents to office today complaining of a painful callus lesion to the plantar aspect of the right foot as well as history of bleeding left great toenail. Patient states that he trimmed his own nail he tried to trim the cuticle had some bleeding. Patient states that he trims his own nails and zone calluses and there are no new he does get a little bit of bleeding. Patient has a history of toe amputations to the right foot.  OBJECTIVE General Patient is awake, alert, and oriented x 3 and in no acute distress. Derm Skin is dry and supple bilateral. Negative open lesions or macerations. Remaining integument unremarkable. Nails are tender, long, thickened and dystrophic with subungual debris, consistent with onychomycosis, left great toenail. No signs of infection noted. Vasc  DP and PT pedal pulses palpable bilaterally. Temperature gradient within normal limits.  Neuro Epicritic and protective threshold sensation diminished bilaterally.  Musculoskeletal Exam No symptomatic pedal deformities noted bilateral. Muscular strength within normal limits.  ASSESSMENT 1. Diabetes Mellitus w/ peripheral neuropathy 2. Onychomycosis of nail due to dermatophyte bilateral 3. Painful callus lesion plantar aspect of the right foot.  PLAN OF CARE 1. Patient evaluated today. 2. Instructed to maintain good pedal hygiene and foot care. Stressed importance of controlling blood sugar.  3. Mechanical debridement of toenails to the left foot was performed using a nail nipper. Filed with dremel without incident.  4. Excisional debridement of pre-ulcerative callus lesion was performed using a chisel blade without incident or bleeding. 5. Return to clinic in 3 mos.     Edrick Kins, DPM [facility]  Dr. Edrick Kins, [address]                                        [place], [place] [address]                Office [phone]   Fax [phone]

## 2017-01-27 ENCOUNTER — Other Ambulatory Visit: Payer: Self-pay | Admitting: Internal Medicine

## 2017-01-27 DIAGNOSIS — J441 Chronic obstructive pulmonary disease with (acute) exacerbation: Secondary | ICD-10-CM | POA: Diagnosis not present

## 2017-01-27 DIAGNOSIS — G4733 Obstructive sleep apnea (adult) (pediatric): Secondary | ICD-10-CM | POA: Diagnosis not present

## 2017-01-28 ENCOUNTER — Telehealth: Payer: Self-pay | Admitting: *Deleted

## 2017-01-28 MED ORDER — METFORMIN HCL 500 MG PO TABS: 500.0000 mg | ORAL_TABLET | Freq: Two times a day (BID) | ORAL | 0 refills | Status: DC

## 2017-01-28 MED ORDER — SIMVASTATIN 40 MG PO TABS: 40.0000 mg | ORAL_TABLET | Freq: Every evening | ORAL | 0 refills | Status: DC

## 2017-01-28 NOTE — Telephone Encounter (Signed)
Rec'd call pt states it will be a week before he get his metformin & simvastatin from mail order wanting to know does MD have samples. Inform pt we do not carry generic samples, but can send him a week supply to [facility]. Sent electronically...Johny Chess

## 2017-01-31 ENCOUNTER — Encounter (HOSPITAL_BASED_OUTPATIENT_CLINIC_OR_DEPARTMENT_OTHER): Payer: Medicare HMO | Attending: Internal Medicine

## 2017-01-31 DIAGNOSIS — L97521 Non-pressure chronic ulcer of other part of left foot limited to breakdown of skin: Secondary | ICD-10-CM | POA: Diagnosis not present

## 2017-01-31 DIAGNOSIS — G4730 Sleep apnea, unspecified: Secondary | ICD-10-CM | POA: Insufficient documentation

## 2017-01-31 DIAGNOSIS — E1142 Type 2 diabetes mellitus with diabetic polyneuropathy: Secondary | ICD-10-CM | POA: Insufficient documentation

## 2017-01-31 DIAGNOSIS — E11621 Type 2 diabetes mellitus with foot ulcer: Secondary | ICD-10-CM | POA: Diagnosis not present

## 2017-01-31 DIAGNOSIS — L97522 Non-pressure chronic ulcer of other part of left foot with fat layer exposed: Secondary | ICD-10-CM | POA: Diagnosis not present

## 2017-02-07 DIAGNOSIS — E1142 Type 2 diabetes mellitus with diabetic polyneuropathy: Secondary | ICD-10-CM | POA: Diagnosis not present

## 2017-02-07 DIAGNOSIS — S91102A Unspecified open wound of left great toe without damage to nail, initial encounter: Secondary | ICD-10-CM | POA: Diagnosis not present

## 2017-02-07 DIAGNOSIS — L97521 Non-pressure chronic ulcer of other part of left foot limited to breakdown of skin: Secondary | ICD-10-CM | POA: Diagnosis not present

## 2017-02-07 DIAGNOSIS — E11621 Type 2 diabetes mellitus with foot ulcer: Secondary | ICD-10-CM | POA: Diagnosis not present

## 2017-02-07 DIAGNOSIS — E119 Type 2 diabetes mellitus without complications: Secondary | ICD-10-CM | POA: Diagnosis not present

## 2017-02-07 DIAGNOSIS — G4730 Sleep apnea, unspecified: Secondary | ICD-10-CM | POA: Diagnosis not present

## 2017-02-17 DIAGNOSIS — J441 Chronic obstructive pulmonary disease with (acute) exacerbation: Secondary | ICD-10-CM | POA: Diagnosis not present

## 2017-02-27 DIAGNOSIS — J441 Chronic obstructive pulmonary disease with (acute) exacerbation: Secondary | ICD-10-CM | POA: Diagnosis not present

## 2017-02-27 DIAGNOSIS — G4733 Obstructive sleep apnea (adult) (pediatric): Secondary | ICD-10-CM | POA: Diagnosis not present

## 2017-02-28 DIAGNOSIS — H5213 Myopia, bilateral: Secondary | ICD-10-CM | POA: Diagnosis not present

## 2017-02-28 DIAGNOSIS — H52203 Unspecified astigmatism, bilateral: Secondary | ICD-10-CM | POA: Diagnosis not present

## 2017-02-28 DIAGNOSIS — E119 Type 2 diabetes mellitus without complications: Secondary | ICD-10-CM | POA: Diagnosis not present

## 2017-02-28 DIAGNOSIS — H524 Presbyopia: Secondary | ICD-10-CM | POA: Diagnosis not present

## 2017-02-28 DIAGNOSIS — H2513 Age-related nuclear cataract, bilateral: Secondary | ICD-10-CM | POA: Diagnosis not present

## 2017-02-28 LAB — HM DIABETES EYE EXAM

## 2017-03-19 DIAGNOSIS — J441 Chronic obstructive pulmonary disease with (acute) exacerbation: Secondary | ICD-10-CM | POA: Diagnosis not present

## 2017-03-29 DIAGNOSIS — J441 Chronic obstructive pulmonary disease with (acute) exacerbation: Secondary | ICD-10-CM | POA: Diagnosis not present

## 2017-03-29 DIAGNOSIS — G4733 Obstructive sleep apnea (adult) (pediatric): Secondary | ICD-10-CM | POA: Diagnosis not present

## 2017-04-07 ENCOUNTER — Ambulatory Visit (INDEPENDENT_AMBULATORY_CARE_PROVIDER_SITE_OTHER): Payer: Medicare HMO | Admitting: *Deleted

## 2017-04-07 VITALS — BP 138/62 | HR 66 | Resp 20 | Ht >= 80 in | Wt 303.0 lb

## 2017-04-07 DIAGNOSIS — Z0000 Encounter for general adult medical examination without abnormal findings: Secondary | ICD-10-CM

## 2017-04-07 NOTE — Progress Notes (Addendum)
Subjective:   Jason [name] is a 76 y.o. male who presents for Medicare Annual/Subsequent preventive examination.  Review of Systems:  No ROS.  Medicare Wellness Visit.  Cardiac Risk Factors include: advanced age (>36mn, >>5women);diabetes mellitus;dyslipidemia;hypertension;male gender Sleep patterns: has frequent nighttime awakenings, gets up 1-2 times nightly to void and sleeps 5-6 hours nightly.  Patient reports long-term insomnia issues, discussed recommended sleep tips and stress reduction tips, education was attached to patient's AVS.  Home Safety/Smoke Alarms:  Feels safe in home. Smoke alarms in place.   Living environment; residence and Firearm Safety: 1-story house/ trailer, no firearms. Lives with wife Seat Belt Safety/Bike Helmet: Wears seat belt.   Counseling:   Eye Exam- appointment yearly Dental- Resources provided  Male:   CCS-  Last 06/03/15, polyps, recall 5 years   PSA-  Lab Results  Component Value Date   PSA 0.44 09/24/2015   PSA 0.49 02/18/2014   PSA 0.38 11/29/2012       Objective:    Vitals: BP 138/62   Pulse 66   Resp 20   Ht '6\' 8"'  (2.032 m)   Wt (!) 303 lb (137.4 kg)   SpO2 98%   BMI 33.29 kg/m   Body mass index is 33.29 kg/m.  Tobacco History  Smoking Status  . Former Smoker  . Packs/day: 0.50  . Years: 5.00  . Types: Cigarettes  . Quit date: 09/05/2015  Smokeless Tobacco  . Never Used    Comment: He quit in 1973.  Smoked for one year between 2015 and 2015     Counseling given: Not Answered   Past Medical History:  Diagnosis Date  . Anxiety   . Asthma    in past, no inhaler now  . Benign neoplasm of colon   . Depression   . Diverticulosis of colon (without mention of hemorrhage)   . DJD (degenerative joint disease)   . Esophageal reflux   . Glaucoma   . Hidradenitis   . History of BPH   . History of gynecomastia    Unilateral  . Hyperlipidemia   . Irritable bowel syndrome   . Memory loss   . MVP (mitral valve  prolapse)   . Obesity, unspecified   . Sleep apnea    does not use  cpap or bipap .. no study  ever  . Type II or unspecified type diabetes mellitus without mention of complication, not stated as uncontrolled   . Unspecified venous (peripheral) insufficiency    Past Surgical History:  Procedure Laterality Date  . AMPUTATION  08/09/2012   Procedure: AMPUTATION DIGIT;  Surgeon: MNewt Minion MD;  [facility] [facility]  Service: Orthopedics;  Laterality: Right;  Amputation right Great Toe MTP Joint  . AMPUTATION  10/17/2012   Procedure: AMPUTATION DIGIT;  Surgeon: MNewt Minion MD;  [facility]  Service: Orthopedics;  Laterality: Right;  Right Foot 3rd Toe Amputation at Metatarsophalangeal Joint  . CIRCUMCISION  06/2100   For Phimosis - Dr THartley Barefoot . COLON SURGERY    . KNEE ARTHROSCOPY    . LAPAROTOMY  07/20/11  . LASER ABLATION  06/2009   Left Greater Saphenous vein -Dr EDonnetta Hutching . TOE AMPUTATION  06/2009   x3Right foot second toe -Dr BBeola Cord . TONSILLECTOMY     Family History  Problem Relation Age of Onset  . Diabetes Father   . Cancer Father        Prostate  . Stroke Father   .  Prostate cancer Father   . Colon cancer Neg Hx   . Esophageal cancer Neg Hx   . Rectal cancer Neg Hx   . Stomach cancer Neg Hx    History  Sexual Activity  . Sexual activity: Not Currently    Outpatient Encounter Prescriptions as of 04/07/2017  Medication Sig  . albuterol (PROVENTIL HFA;VENTOLIN HFA) 108 (90 BASE) MCG/ACT inhaler Inhale 1-2 puffs into the lungs every 6 (six) hours as needed for wheezing or shortness of breath.  Marland Kitchen albuterol (PROVENTIL) (2.5 MG/3ML) 0.083% nebulizer solution Take 3 mLs (2.5 mg total) by nebulization every 4 (four) hours as needed for wheezing.  Marland Kitchen aspirin EC 81 MG tablet Take 1 tablet (81 mg total) by mouth daily.  . blood glucose meter kit and supplies KIT Use to test blood sugar up to 3 times daily. DX E11.9  . Blood Glucose Monitoring Suppl (TRUE METRIX METER) w/Device  KIT Use to check blood sugar DX E11.8  . fluticasone furoate-vilanterol (BREO ELLIPTA) 100-25 MCG/INH AEPB Inhale 1 puff into the lungs daily.  Marland Kitchen glimepiride (AMARYL) 2 MG tablet Take 1 tablet (2 mg total) by mouth daily with breakfast.  . glucose blood (COOL BLOOD GLUCOSE TEST STRIPS) test strip Use to test blood sugar up to 3 times daily. DX E11.9  . metFORMIN (GLUCOPHAGE) 500 MG tablet Take 1 tablet (500 mg total) by mouth 2 (two) times daily with a meal.  . simvastatin (ZOCOR) 40 MG tablet Take 1 tablet (40 mg total) by mouth every evening.  . TRUEPLUS LANCETS 33G MISC USE  TO TEST BLOOD SUGAR  UP  TO THREE TIMES DAILY  . vitamin C (ASCORBIC ACID) 500 MG tablet Take 1 tablet (500 mg total) by mouth daily.   No facility-administered encounter medications on file as of 04/07/2017.     Activities of Daily Living In your present state of health, do you have any difficulty performing the following activities: 04/07/2017  Hearing? N  Vision? N  Difficulty concentrating or making decisions? N  Walking or climbing stairs? N  Dressing or bathing? N  Doing errands, shopping? N  Preparing Food and eating ? N  Using the Toilet? N  In the past six months, have you accidently leaked urine? N  Do you have problems with loss of bowel control? N  Managing your Medications? N  Managing your Finances? N  Housekeeping or managing your Housekeeping? N  Some recent data might be hidden    Patient Care Team: [name], MD as PCP - General (Internal Medicine)   Assessment:    Physical assessment deferred to PCP.  Exercise Activities and Dietary recommendations Current Exercise Habits: Home exercise routine, Type of exercise: walking, Time (Minutes): 30, Frequency (Times/Week): 3, Weekly Exercise (Minutes/Week): 90, Intensity: Mild, Exercise limited by: None identified  Diet (meal preparation, eat out, water intake, caffeinated beverages, dairy products, fruits and vegetables): in general, a  "healthy" diet  , well balanced eats a variety of fruits and vegetables daily, limits salt, fat/cholesterol, sugar, caffeine, drinks 6-8 glasses of water daily.  Reviewed heart healthy and diabetes diet with patient, he is going to attend diabetes education seminar in June.   Goals    . 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       Fall Risk Fall Risk  04/07/2017 01/15/2016 11/21/2015 09/25/2015 09/24/2015  Falls in the past year? No No No No No   Depression Screen PHQ 2/9 Scores 04/07/2017 01/15/2016 11/21/2015 09/25/2015  PHQ - 2 Score 2 0 0 -  PHQ- 9 Score 5 - - -  Exception Documentation - - - Medical reason    Cognitive Function       Ad8 score reviewed for issues:  Issues making decisions: 0  Less interest in hobbies / activities:0  Repeats questions, stories (family complaining):0  Trouble using ordinary gadgets (microwave, computer, phone):0  Forgets the month or year: 0  Mismanaging finances:0   Remembering appts:0  Daily problems with thinking and/or memory:0 Ad8 score is=0     Immunization History  Administered Date(s) Administered  . Influenza Split 08/30/2011, 08/22/2012, 09/10/2015  . Influenza Whole 10/24/2007, 08/26/2009  . Influenza,inj,Quad PF,36+ Mos 08/20/2013, 08/09/2014, 08/23/2016  . Influenza-Unspecified 09/12/2015  . Pneumococcal Conjugate-13 02/18/2014  . Pneumococcal Polysaccharide-23 08/06/2008  . Td 02/03/2009  . Zoster 08/09/2014   Screening Tests Health Maintenance  Topic Date Due  . INFLUENZA VACCINE  06/15/2017  . HEMOGLOBIN A1C  07/20/2017  . FOOT EXAM  01/17/2018  . URINE MICROALBUMIN  01/17/2018  . OPHTHALMOLOGY EXAM  02/28/2018  . COLONOSCOPY  06/09/2018  . TETANUS/TDAP  02/04/2019  . PNA vac Low Risk Adult  Completed      Plan:    Continue to eat heart healthy  diet (full of fruits, vegetables, whole grains, lean protein, water--limit salt, fat, and sugar intake) and increase physical activity as tolerated.  Continue doing brain stimulating activities (puzzles, reading, adult coloring books, staying active) to keep memory sharp.    I have personally reviewed and noted the following in the patient's chart:   . Medical and social history . Use of alcohol, tobacco or illicit drugs  . Current medications and supplements . Functional ability and status . Nutritional status . Physical activity . Advanced directives . List of other physicians . Vitals  . Screenings to include cognitive, depression, and falls . Referrals and appointments  In addition, I have reviewed and discussed with patient certain preventive protocols, quality metrics, and best practice recommendations. A written personalized care plan for preventive services as well as general preventive health recommendations were provided to patient.     Michiel Cowboy, RN  04/07/2017  Medical screening examination/treatment/procedure(s) were performed by non-physician practitioner and as supervising physician I was immediately available for consultation/collaboration. I agree with above. Scarlette Calico, MD

## 2017-04-07 NOTE — Progress Notes (Signed)
Pre visit review using our clinic review tool, if applicable. No additional management support is needed unless otherwise documented below in the visit note. 

## 2017-04-07 NOTE — Patient Instructions (Signed)
Continue to eat heart healthy diet (full of fruits, vegetables, whole grains, lean protein, water--limit salt, fat, and sugar intake) and increase physical activity as tolerated.  Continue doing brain stimulating activities (puzzles, reading, adult coloring books, staying active) to keep memory sharp.   [name] , Thank you for taking time to come for your Medicare Wellness Visit. I appreciate your ongoing commitment to your health goals. Please review the following plan we discussed and let me know if I can assist you in the future.   These are the goals we discussed: Goals    . 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        This is a list of the screening recommended for you and due dates:  Health Maintenance  Topic Date Due  . Flu Shot  06/15/2017  . Hemoglobin A1C  07/20/2017  . Complete foot exam   01/17/2018  . Urine Protein Check  01/17/2018  . Eye exam for diabetics  02/28/2018  . Colon Cancer Screening  06/09/2018  . Tetanus Vaccine  02/04/2019  . Pneumonia vaccines  Completed     Stress and Stress Management Stress is a normal reaction to life events. It is what you feel when life demands more than you are used to or more than you can handle. Some stress can be useful. For example, the stress reaction can help you catch the last bus of the day, study for a test, or meet a deadline at work. But stress that occurs too often or for too long can cause problems. It can affect your emotional health and interfere with relationships and normal daily activities. Too much stress can weaken your immune system and increase your risk for physical illness. If you already have a medical problem, stress can make it worse. What are the causes? All sorts of life events may cause stress. An event that causes stress for one person may not  be stressful for another person. Major life events commonly cause stress. These may be positive or negative. Examples include losing your job, moving into a new home, getting married, having a baby, or losing a loved one. Less obvious life events may also cause stress, especially if they occur day after day or in combination. Examples include working long hours, driving in traffic, caring for children, being in debt, or being in a difficult relationship. What are the signs or symptoms? Stress may cause emotional symptoms including, the following:  Anxiety. This is feeling worried, afraid, on edge, overwhelmed, or out of control.  Anger. This is feeling irritated or impatient.  Depression. This is feeling sad, down, helpless, or guilty.  Difficulty focusing, remembering, or making decisions. Stress may cause physical symptoms, including the following:  Aches and pains. These may affect your head, neck, back, stomach, or other areas of your body.  Tight muscles or clenched jaw.  Low energy or trouble sleeping. Stress may cause unhealthy behaviors, including the following:  Eating to feel better (overeating) or skipping meals.  Sleeping too little, too much, or both.  Working too much or putting off tasks (procrastination).  Smoking, drinking alcohol, or using drugs to feel better. How is this diagnosed? Stress is diagnosed through an assessment by your health care provider.  Your health care provider will ask questions about your symptoms and any stressful life events.Your health care provider will also ask about your medical history and may order blood tests or other tests. Certain medical conditions and medicine can cause physical symptoms similar to stress. Mental illness can cause emotional symptoms and unhealthy behaviors similar to stress. Your health care provider may refer you to a mental health professional for further evaluation. How is this treated? Stress management is the  recommended treatment for stress.The goals of stress management are reducing stressful life events and coping with stress in healthy ways. Techniques for reducing stressful life events include the following:  Stress identification. Self-monitor for stress and identify what causes stress for you. These skills may help you to avoid some stressful events.  Time management. Set your priorities, keep a calendar of events, and learn to say "no." These tools can help you avoid making too many commitments. Techniques for coping with stress include the following:  Rethinking the problem. Try to think realistically about stressful events rather than ignoring them or overreacting. Try to find the positives in a stressful situation rather than focusing on the negatives.  Exercise. Physical exercise can release both physical and emotional tension. The key is to find a form of exercise you enjoy and do it regularly.  Relaxation techniques. These relax the body and mind. Examples include yoga, meditation, tai chi, biofeedback, deep breathing, progressive muscle relaxation, listening to music, being out in nature, journaling, and other hobbies. Again, the key is to find one or more that you enjoy and can do regularly.  Healthy lifestyle. Eat a balanced diet, get plenty of sleep, and do not smoke. Avoid using alcohol or drugs to relax.  Strong support network. Spend time with family, friends, or other people you enjoy being around.Express your feelings and talk things over with someone you trust. Counseling or talktherapy with a mental health professional may be helpful if you are having difficulty managing stress on your own. Medicine is typically not recommended for the treatment of stress.Talk to your health care provider if you think you need medicine for symptoms of stress. Follow these instructions at home:  Keep all follow-up visits as directed by your health care provider.  Take all medicines as  directed by your health care provider. Contact a health care provider if:  Your symptoms get worse or you start having new symptoms.  You feel overwhelmed by your problems and can no longer manage them on your own. Get help right away if:  You feel like hurting yourself or someone else. This information is not intended to replace advice given to you by your health care provider. Make sure you discuss any questions you have with your health care provider. Document Released: 04/27/2001 Document Revised: 04/08/2016 Document Reviewed: 06/26/2013 Elsevier Interactive Patient Education  2017 [place]. Insomnia Insomnia is a sleep disorder that makes it difficult to fall asleep or to stay asleep. Insomnia can cause tiredness (fatigue), low energy, difficulty concentrating, mood swings, and poor performance at work or school. There are three different ways to classify insomnia:  Difficulty falling asleep.  Difficulty staying asleep.  Waking up too early in the morning. Any type of insomnia can be long-term (chronic) or short-term (acute). Both are common. Short-term insomnia usually lasts for three months or less. Chronic insomnia occurs at least three times a week for longer than three months. What are the causes? Insomnia may be caused by another condition, situation, or substance, such  as:  Anxiety.  Certain medicines.  Gastroesophageal reflux disease (GERD) or other gastrointestinal conditions.  Asthma or other breathing conditions.  Restless legs syndrome, sleep apnea, or other sleep disorders.  Chronic pain.  Menopause. This may include hot flashes.  Stroke.  Abuse of alcohol, tobacco, or illegal drugs.  Depression.  Caffeine.  Neurological disorders, such as Alzheimer disease.  An overactive thyroid (hyperthyroidism). The cause of insomnia may not be known. What increases the risk? Risk factors for insomnia include:  Gender. Women are more commonly affected  than men.  Age. Insomnia is more common as you get older.  Stress. This may involve your professional or personal life.  Income. Insomnia is more common in people with lower income.  Lack of exercise.  Irregular work schedule or night shifts.  Traveling between different time zones. What are the signs or symptoms? If you have insomnia, trouble falling asleep or trouble staying asleep is the main symptom. This may lead to other symptoms, such as:  Feeling fatigued.  Feeling nervous about going to sleep.  Not feeling rested in the morning.  Having trouble concentrating.  Feeling irritable, anxious, or depressed. How is this treated? Treatment for insomnia depends on the cause. If your insomnia is caused by an underlying condition, treatment will focus on addressing the condition. Treatment may also include:  Medicines to help you sleep.  Counseling or therapy.  Lifestyle adjustments. Follow these instructions at home:  Take medicines only as directed by your health care provider.  Keep regular sleeping and waking hours. Avoid naps.  Keep a sleep diary to help you and your health care provider figure out what could be causing your insomnia. Include:  When you sleep.  When you wake up during the night.  How well you sleep.  How rested you feel the next day.  Any side effects of medicines you are taking.  What you eat and drink.  Make your bedroom a comfortable place where it is easy to fall asleep:  Put up shades or special blackout curtains to block light from outside.  Use a white noise machine to block noise.  Keep the temperature cool.  Exercise regularly as directed by your health care provider. Avoid exercising right before bedtime.  Use relaxation techniques to manage stress. Ask your health care provider to suggest some techniques that may work well for you. These may include:  Breathing exercises.  Routines to release muscle  tension.  Visualizing peaceful scenes.  Cut back on alcohol, caffeinated beverages, and cigarettes, especially close to bedtime. These can disrupt your sleep.  Do not overeat or eat spicy foods right before bedtime. This can lead to digestive discomfort that can make it hard for you to sleep.  Limit screen use before bedtime. This includes:  Watching TV.  Using your smartphone, tablet, and computer.  Stick to a routine. This can help you fall asleep faster. Try to do a quiet activity, brush your teeth, and go to bed at the same time each night.  Get out of bed if you are still awake after 15 minutes of trying to sleep. Keep the lights down, but try reading or doing a quiet activity. When you feel sleepy, go back to bed.  Make sure that you drive carefully. Avoid driving if you feel very sleepy.  Keep all follow-up appointments as directed by your health care provider. This is important. Contact a health care provider if:  You are tired throughout the day or have trouble in  your daily routine due to sleepiness.  You continue to have sleep problems or your sleep problems get worse. Get help right away if:  You have serious thoughts about hurting yourself or someone else. This information is not intended to replace advice given to you by your health care provider. Make sure you discuss any questions you have with your health care provider. Document Released: 10/29/2000 Document Revised: 04/02/2016 Document Reviewed: 08/02/2014 Elsevier Interactive Patient Education  2017 [employer].

## 2017-04-19 DIAGNOSIS — J441 Chronic obstructive pulmonary disease with (acute) exacerbation: Secondary | ICD-10-CM | POA: Diagnosis not present

## 2017-04-25 ENCOUNTER — Encounter (HOSPITAL_BASED_OUTPATIENT_CLINIC_OR_DEPARTMENT_OTHER): Payer: Medicare HMO

## 2017-05-02 ENCOUNTER — Ambulatory Visit: Payer: Medicare HMO | Admitting: Podiatry

## 2017-05-09 ENCOUNTER — Ambulatory Visit (INDEPENDENT_AMBULATORY_CARE_PROVIDER_SITE_OTHER): Payer: Medicare HMO | Admitting: Podiatry

## 2017-05-09 DIAGNOSIS — M79676 Pain in unspecified toe(s): Secondary | ICD-10-CM

## 2017-05-09 DIAGNOSIS — E0842 Diabetes mellitus due to underlying condition with diabetic polyneuropathy: Secondary | ICD-10-CM | POA: Diagnosis not present

## 2017-05-09 DIAGNOSIS — L84 Corns and callosities: Secondary | ICD-10-CM

## 2017-05-09 DIAGNOSIS — B351 Tinea unguium: Secondary | ICD-10-CM

## 2017-05-10 ENCOUNTER — Other Ambulatory Visit: Payer: Self-pay | Admitting: Internal Medicine

## 2017-05-11 NOTE — Progress Notes (Signed)
   SUBJECTIVE Patient with a history of diabetes mellitus presents to office today complaining of elongated, thickened nails. Pain while ambulating in shoes. Patient is unable to trim their own nails. He is also here for follow-up evaluation of a painful callus to the right foot. He also reports a possible corn the lateral left fifth toe. He is also interested in discussing removal of the right great toenail. He is here for further evaluation and treatment.  OBJECTIVE General Patient is awake, alert, and oriented x 3 and in no acute distress. Derm Skin is dry and supple bilateral. Negative open lesions or macerations. Remaining integument unremarkable. Nails are tender, long, thickened and dystrophic with subungual debris, consistent with onychomycosis, 1-5 bilateral. No signs of infection noted. Vasc  DP and PT pedal pulses palpable bilaterally. Temperature gradient within normal limits.  Neuro Epicritic and protective threshold sensation diminished bilaterally.  Musculoskeletal Exam History of multiple toe amputations right foot. No symptomatic pedal deformities noted bilateral. Muscular strength within normal limits.  ASSESSMENT 1. Diabetes Mellitus w/ peripheral neuropathy 2. Onychomycosis of nail due to dermatophyte bilateral 3. Pain in foot bilateral 4. Pre-ulcerative calluses bilaterally  PLAN OF CARE 1. Patient evaluated today. 2. Instructed to maintain good pedal hygiene and foot care. Stressed importance of controlling blood sugar.  3. Mechanical debridement of nails 1-5 bilaterally performed using a nail nipper. Filed with dremel without incident.  4. Excisional debridement of keratotic lesion using a chisel blade was performed without incident.  5. Treated area(s) with Salinocaine and dressed with light dressing. 6. Return to clinic when necessary.      Edrick Kins, DPM [facility]  Dr. Edrick Kins, [address]                                         [place], [place] [address]                Office [phone]  Fax [phone]

## 2017-05-17 ENCOUNTER — Other Ambulatory Visit (INDEPENDENT_AMBULATORY_CARE_PROVIDER_SITE_OTHER): Payer: Medicare HMO

## 2017-05-17 ENCOUNTER — Encounter: Payer: Self-pay | Admitting: Internal Medicine

## 2017-05-17 ENCOUNTER — Ambulatory Visit (INDEPENDENT_AMBULATORY_CARE_PROVIDER_SITE_OTHER): Payer: Medicare HMO | Admitting: Internal Medicine

## 2017-05-17 VITALS — BP 128/64 | HR 76 | Temp 97.7°F | Resp 16 | Ht >= 80 in | Wt 310.0 lb

## 2017-05-17 DIAGNOSIS — N400 Enlarged prostate without lower urinary tract symptoms: Secondary | ICD-10-CM

## 2017-05-17 DIAGNOSIS — Z6835 Body mass index (BMI) 35.0-35.9, adult: Secondary | ICD-10-CM | POA: Diagnosis not present

## 2017-05-17 DIAGNOSIS — E1165 Type 2 diabetes mellitus with hyperglycemia: Secondary | ICD-10-CM

## 2017-05-17 DIAGNOSIS — R319 Hematuria, unspecified: Secondary | ICD-10-CM | POA: Diagnosis not present

## 2017-05-17 DIAGNOSIS — E118 Type 2 diabetes mellitus with unspecified complications: Secondary | ICD-10-CM

## 2017-05-17 DIAGNOSIS — IMO0002 Reserved for concepts with insufficient information to code with codable children: Secondary | ICD-10-CM

## 2017-05-17 LAB — HEMOGLOBIN A1C: Hgb A1c MFr Bld: 7.3 % — ABNORMAL HIGH (ref 4.6–6.5)

## 2017-05-17 LAB — URINALYSIS, ROUTINE W REFLEX MICROSCOPIC
BILIRUBIN URINE: NEGATIVE
HGB URINE DIPSTICK: NEGATIVE
KETONES UR: NEGATIVE
LEUKOCYTES UA: NEGATIVE
Nitrite: NEGATIVE
RBC / HPF: NONE SEEN (ref 0–?)
Specific Gravity, Urine: 1.025 (ref 1.000–1.030)
Urine Glucose: 100 — AB
Urobilinogen, UA: >=8 — AB (ref 0.0–1.0)
pH: 6 (ref 5.0–8.0)

## 2017-05-17 LAB — PSA: PSA: 0.35 ng/mL (ref 0.10–4.00)

## 2017-05-17 LAB — BASIC METABOLIC PANEL
BUN: 11 mg/dL (ref 6–23)
CHLORIDE: 102 meq/L (ref 96–112)
CO2: 31 mEq/L (ref 19–32)
Calcium: 9.2 mg/dL (ref 8.4–10.5)
Creatinine, Ser: 1.06 mg/dL (ref 0.40–1.50)
GFR: 87.55 mL/min (ref >60.00)
GLUCOSE: 224 mg/dL — AB (ref 70–99)
POTASSIUM: 4 meq/L (ref 3.5–5.1)
Sodium: 139 mEq/L (ref 135–145)

## 2017-05-17 NOTE — Progress Notes (Signed)
Subjective:  Patient ID: Jason [name], male    DOB: 03/11/1941  Age: 76 y.o. MRN: [id number]  CC: Diabetes   HPI Jason [name] presents for f/up on DM2- He is frustrated that he has gained weight recently but he feels like his blood sugars are adequately well-controlled as he has had no episodes of polyuria, polydipsia, or polyphagia.  Outpatient Medications Prior to Visit  Medication Sig Dispense Refill  . aspirin EC 81 MG tablet Take 1 tablet (81 mg total) by mouth daily. 90 tablet 3  . blood glucose meter kit and supplies KIT Use to test blood sugar up to 3 times daily. DX E11.9 1 each 0  . Blood Glucose Monitoring Suppl (TRUE METRIX METER) w/Device KIT Use to check blood sugar DX E11.8 1 kit 0  . glimepiride (AMARYL) 2 MG tablet Take 1 tablet (2 mg total) by mouth daily with breakfast. 90 tablet 1  . metFORMIN (GLUCOPHAGE) 500 MG tablet Take 1 tablet (500 mg total) by mouth 2 (two) times daily with a meal. 14 tablet 0  . simvastatin (ZOCOR) 40 MG tablet Take 1 tablet (40 mg total) by mouth every evening. 7 tablet 0  . TRUE METRIX BLOOD GLUCOSE TEST test strip USE  TO TEST BLOOD SUGAR  UP  TO THREE TIMES DAILY 300 each 3  . TRUEPLUS LANCETS 33G MISC USE  TO TEST BLOOD SUGAR  UP  TO THREE TIMES DAILY 300 each 3  . albuterol (PROVENTIL HFA;VENTOLIN HFA) 108 (90 BASE) MCG/ACT inhaler Inhale 1-2 puffs into the lungs every 6 (six) hours as needed for wheezing or shortness of breath. 3 Inhaler 2  . albuterol (PROVENTIL) (2.5 MG/3ML) 0.083% nebulizer solution Take 3 mLs (2.5 mg total) by nebulization every 4 (four) hours as needed for wheezing. 75 mL 12  . fluticasone furoate-vilanterol (BREO ELLIPTA) 100-25 MCG/INH AEPB Inhale 1 puff into the lungs daily. 2 each 0  . vitamin C (ASCORBIC ACID) 500 MG tablet Take 1 tablet (500 mg total) by mouth daily. 90 tablet 3   No facility-administered medications prior to visit.     ROS Review of Systems  Constitutional: Positive for unexpected  weight change. Negative for appetite change, diaphoresis and fatigue.  HENT: Negative.   Eyes: Negative.  Negative for visual disturbance.  Respiratory: Negative.  Negative for cough, chest tightness, shortness of breath and wheezing.   Cardiovascular: Negative.  Negative for chest pain, palpitations and leg swelling.  Gastrointestinal: Negative for abdominal pain, constipation, diarrhea, nausea and vomiting.  Endocrine: Negative.  Negative for cold intolerance, heat intolerance, polydipsia, polyphagia and polyuria.  Genitourinary: Negative.  Negative for difficulty urinating.  Musculoskeletal: Negative.  Negative for back pain and neck pain.  Skin: Negative.  Negative for color change and rash.  Allergic/Immunologic: Negative.   Neurological: Negative.   Hematological: Negative.  Negative for adenopathy. Does not bruise/bleed easily.  Psychiatric/Behavioral: Negative.     Objective:  BP 128/64 (BP Location: Left Arm, Patient Position: Sitting, Cuff Size: Large)   Pulse 76   Temp 97.7 F (36.5 C) (Oral)   Resp 16   Ht '6\' 8"'  (2.032 m)   Wt (!) 310 lb (140.6 kg)   SpO2 98%   BMI 34.06 kg/m   BP Readings from Last 3 Encounters:  05/17/17 128/64  04/07/17 138/62  01/17/17 110/68    Wt Readings from Last 3 Encounters:  05/17/17 (!) 310 lb (140.6 kg)  04/07/17 (!) 303 lb (137.4 kg)  01/17/17 (!) 309  lb 12 oz (140.5 kg)    Physical Exam  Constitutional: He is oriented to person, place, and time. No distress.  HENT:  Mouth/Throat: Oropharynx is clear and moist. No oropharyngeal exudate.  Eyes: Conjunctivae are normal. Right eye exhibits no discharge. Left eye exhibits no discharge. No scleral icterus.  Neck: Normal range of motion. Neck supple. No JVD present. No thyromegaly present.  Cardiovascular: Normal rate.  Exam reveals no gallop and no friction rub.   Murmur (1/6 SEM) heard. Pulmonary/Chest: Effort normal and breath sounds normal. No respiratory distress. He has no  wheezes. He has no rales. He exhibits no tenderness.  Abdominal: Soft. Bowel sounds are normal. He exhibits no distension and no mass. There is no tenderness. There is no rebound and no guarding.  Musculoskeletal: Normal range of motion. He exhibits no edema, tenderness or deformity.  Neurological: He is alert and oriented to person, place, and time.  Skin: Skin is warm and dry. No rash noted. He is not diaphoretic. No erythema. No pallor.  Vitals reviewed.   Lab Results  Component Value Date   WBC 4.4 01/17/2017   HGB 13.9 01/17/2017   HCT 41.9 01/17/2017   PLT 150.0 01/17/2017   GLUCOSE 224 (H) 05/17/2017   CHOL 136 01/17/2017   TRIG 57.0 01/17/2017   HDL 45.80 01/17/2017   LDLCALC 79 01/17/2017   ALT 17 01/17/2017   AST 14 01/17/2017   NA 139 05/17/2017   K 4.0 05/17/2017   CL 102 05/17/2017   CREATININE 1.06 05/17/2017   BUN 11 05/17/2017   CO2 31 05/17/2017   TSH 1.31 01/17/2017   PSA 0.35 05/17/2017   INR 1.05 10/17/2012   HGBA1C 7.3 (H) 05/17/2017   MICROALBUR 2.9 (H) 01/17/2017    Ct Renal Stone Study  Result Date: 01/20/2017 CLINICAL DATA:  Microscopic hematuria. EXAM: CT ABDOMEN AND PELVIS WITHOUT CONTRAST TECHNIQUE: Multidetector CT imaging of the abdomen and pelvis was performed following the standard protocol without IV contrast. COMPARISON:  07/17/2011 FINDINGS: Lower chest: Bronchial wall thickening noted lung bases. Coronary artery calcification is noted. Hepatobiliary: Areas of geographic fatty deposition noted within the liver parenchyma. Layering tiny calcified gallstones are evident. No intrahepatic or extrahepatic biliary dilation. Pancreas: No focal mass lesion. No dilatation of the main duct. No intraparenchymal cyst. No peripancreatic edema. Spleen: No splenomegaly. No focal mass lesion. Adrenals/Urinary Tract: No adrenal nodule or mass. 2 mm nonobstructing stone identified interpolar right kidney and a 1 mm nonobstructing stone is identified in the lower  pole right kidney. 2 mm nonobstructing stone identified upper pole left kidney with a 1 mm interpolar left renal stone. 7.2 cm lesion upper pole left kidney was 6.5 cm on the study from over 5 years ago and averages water attenuation today, compatible with a cyst. A second water density lesion in the interpolar left kidney measures 3.1 cm today compared to 1.8 cm previously. No hydronephrosis in either kidney. No hydroureter. No ureteral or bladder stones. Stomach/Bowel: Stomach is nondistended. No gastric wall thickening. No evidence of outlet obstruction. Duodenum is normally positioned as is the ligament of Treitz. No small bowel wall thickening. No small bowel dilatation. The terminal ileum is normal. The appendix is normal. Diverticular changes are noted in the left colon without evidence of diverticulitis. Vascular/Lymphatic: There is abdominal aortic atherosclerosis without aneurysm. There is no gastrohepatic or hepatoduodenal ligament lymphadenopathy. No intraperitoneal or retroperitoneal lymphadenopathy. No pelvic sidewall lymphadenopathy. Upper normal lymph nodes are identified in the groin region bilaterally. Reproductive:  The prostate gland and seminal vesicles have normal imaging features. Other: No intraperitoneal free fluid. Musculoskeletal: Bone windows reveal no worrisome lytic or sclerotic osseous lesions. Compression deformity at the L4 vertebral body is new since the prior study but age indeterminate on today's study. IMPRESSION: 1. Bilateral nephrolithiasis without hydroureteronephrosis. No ureteral or bladder stones. 2. Left renal cysts, progressed only minimally in the more than 5 year interval since prior study. 3.  Abdominal Aortic Atherosclerois (ICD10-170.0) Electronically Signed   By: [name] M.D.   On: 01/20/2017 16:23    Assessment & Plan:   Corry was seen today for diabetes.  Diagnoses and all orders for this visit:  Uncontrolled type 2 diabetes mellitus with  complication, without long-term current use of insulin ([place])- his blood sugars are adequately well-controlled -     Basic metabolic panel; Future -     Hemoglobin A1c; Future  Benign prostatic hyperplasia without lower urinary tract symptoms- he has no symptoms that need to be treated and his PSA is not rising so I am not concerned about prostate cancer. -     PSA; Future  Hematuria, unspecified type- this has resolved -     Urinalysis, Routine w reflex microscopic; Future  Class 2 severe obesity due to excess calories with serious comorbidity and body mass index (BMI) of 35.0 to 35.9 in adult [facility])- he agrees to work on his lifestyle modifications to lose weight.   I have discontinued Mr. Bossard vitamin C, albuterol, albuterol, and fluticasone furoate-vilanterol. I am also having him maintain his aspirin EC, blood glucose meter kit and supplies, TRUE METRIX METER, TRUEPLUS LANCETS 33G, glimepiride, metFORMIN, simvastatin, and TRUE METRIX BLOOD GLUCOSE TEST.  No orders of the defined types were placed in this encounter.    Follow-up: Return in about 4 months (around 09/17/2017).  Scarlette Calico, MD

## 2017-05-17 NOTE — Patient Instructions (Signed)

## 2017-05-19 DIAGNOSIS — J441 Chronic obstructive pulmonary disease with (acute) exacerbation: Secondary | ICD-10-CM | POA: Diagnosis not present

## 2017-06-19 DIAGNOSIS — J441 Chronic obstructive pulmonary disease with (acute) exacerbation: Secondary | ICD-10-CM | POA: Diagnosis not present

## 2017-07-12 ENCOUNTER — Encounter: Payer: Self-pay | Admitting: Internal Medicine

## 2017-07-12 ENCOUNTER — Ambulatory Visit (INDEPENDENT_AMBULATORY_CARE_PROVIDER_SITE_OTHER): Payer: Medicare HMO | Admitting: Internal Medicine

## 2017-07-12 ENCOUNTER — Ambulatory Visit (INDEPENDENT_AMBULATORY_CARE_PROVIDER_SITE_OTHER)
Admission: RE | Admit: 2017-07-12 | Discharge: 2017-07-12 | Disposition: A | Discharge status: Home / Self Care | Payer: Medicare HMO | Source: Ambulatory Visit | Attending: Internal Medicine | Admitting: Internal Medicine

## 2017-07-12 VITALS — BP 116/56 | HR 59 | Temp 97.9°F | Resp 16 | Ht >= 80 in | Wt 312.5 lb

## 2017-07-12 DIAGNOSIS — L089 Local infection of the skin and subcutaneous tissue, unspecified: Secondary | ICD-10-CM | POA: Insufficient documentation

## 2017-07-12 DIAGNOSIS — E11628 Type 2 diabetes mellitus with other skin complications: Secondary | ICD-10-CM | POA: Diagnosis not present

## 2017-07-12 DIAGNOSIS — Z23 Encounter for immunization: Secondary | ICD-10-CM | POA: Diagnosis not present

## 2017-07-12 DIAGNOSIS — M79671 Pain in right foot: Secondary | ICD-10-CM | POA: Diagnosis not present

## 2017-07-12 MED ORDER — AMOXICILLIN-POT CLAVULANATE 875-125 MG PO TABS: 1.0000 | ORAL_TABLET | Freq: Two times a day (BID) | ORAL | 0 refills | Status: AC

## 2017-07-12 NOTE — Patient Instructions (Signed)

## 2017-07-12 NOTE — Progress Notes (Signed)
Subjective:  Patient ID: Jason [name], male    DOB: 10-19-1941  Age: 76 y.o. MRN: [id number]  CC: Foot Pain   HPI Jason [name] presents for concerns about his right foot - he has a callous on the bottom of his right foot, his first 3 toes have been amputated, from the amputated sites he has noticed some clear drainage and a few spots of blood over the last 4 days.  Outpatient Medications Prior to Visit  Medication Sig Dispense Refill  . aspirin EC 81 MG tablet Take 1 tablet (81 mg total) by mouth daily. 90 tablet 3  . blood glucose meter kit and supplies KIT Use to test blood sugar up to 3 times daily. DX E11.9 1 each 0  . Blood Glucose Monitoring Suppl (TRUE METRIX METER) w/Device KIT Use to check blood sugar DX E11.8 1 kit 0  . glimepiride (AMARYL) 2 MG tablet Take 1 tablet (2 mg total) by mouth daily with breakfast. 90 tablet 1  . metFORMIN (GLUCOPHAGE) 500 MG tablet Take 1 tablet (500 mg total) by mouth 2 (two) times daily with a meal. 14 tablet 0  . simvastatin (ZOCOR) 40 MG tablet Take 1 tablet (40 mg total) by mouth every evening. 7 tablet 0  . TRUE METRIX BLOOD GLUCOSE TEST test strip USE  TO TEST BLOOD SUGAR  UP  TO THREE TIMES DAILY 300 each 3  . TRUEPLUS LANCETS 33G MISC USE  TO TEST BLOOD SUGAR  UP  TO THREE TIMES DAILY 300 each 3   No facility-administered medications prior to visit.     ROS Review of Systems  Constitutional: Negative for chills, fatigue and fever.  HENT: Negative.   Eyes: Negative.   Respiratory: Negative.  Negative for cough, chest tightness, shortness of breath and wheezing.   Cardiovascular: Negative for chest pain, palpitations and leg swelling.  Gastrointestinal: Negative for abdominal pain, constipation, diarrhea, nausea and vomiting.  Endocrine: Negative.   Genitourinary: Negative.   Musculoskeletal: Negative.  Negative for arthralgias.  Skin: Positive for wound. Negative for color change.  Allergic/Immunologic: Negative.     Neurological: Negative.   Hematological: Negative for adenopathy. Does not bruise/bleed easily.  Psychiatric/Behavioral: Negative.     Objective:  BP (!) 116/56 (BP Location: Left Arm, Patient Position: Sitting, Cuff Size: Large)   Pulse (!) 59   Temp 97.9 F (36.6 C) (Oral)   Resp 16   Ht 6' 8" (2.032 m)   Wt (!) 312 lb 8 oz (141.7 kg)   SpO2 98%   BMI 34.33 kg/m   BP Readings from Last 3 Encounters:  07/12/17 (!) 116/56  05/17/17 128/64  04/07/17 138/62    Wt Readings from Last 3 Encounters:  07/12/17 (!) 312 lb 8 oz (141.7 kg)  05/17/17 (!) 310 lb (140.6 kg)  04/07/17 (!) 303 lb (137.4 kg)    Physical Exam  Musculoskeletal:       Right foot: There is swelling and deformity. There is normal range of motion, no tenderness, no bony tenderness, normal capillary refill and no crepitus.  There is a large callus on the plantar side of the right foot over the second, third, and fourth metatarsal pads. At the amputated site there is squishy subcutaneous abnormality but there is no wound, erythema, exudate, induration, fluctuance, or erythema.    Lab Results  Component Value Date   WBC 4.4 01/17/2017   HGB 13.9 01/17/2017   HCT 41.9 01/17/2017   PLT 150.0 01/17/2017  GLUCOSE 224 (H) 05/17/2017   CHOL 136 01/17/2017   TRIG 57.0 01/17/2017   HDL 45.80 01/17/2017   LDLCALC 79 01/17/2017   ALT 17 01/17/2017   AST 14 01/17/2017   NA 139 05/17/2017   K 4.0 05/17/2017   CL 102 05/17/2017   CREATININE 1.06 05/17/2017   BUN 11 05/17/2017   CO2 31 05/17/2017   TSH 1.31 01/17/2017   PSA 0.35 05/17/2017   INR 1.05 10/17/2012   HGBA1C 7.3 (H) 05/17/2017   MICROALBUR 2.9 (H) 01/17/2017    Ct Renal Stone Study  Result Date: 01/20/2017 CLINICAL DATA:  Microscopic hematuria. EXAM: CT ABDOMEN AND PELVIS WITHOUT CONTRAST TECHNIQUE: Multidetector CT imaging of the abdomen and pelvis was performed following the standard protocol without IV contrast. COMPARISON:  07/17/2011  FINDINGS: Lower chest: Bronchial wall thickening noted lung bases. Coronary artery calcification is noted. Hepatobiliary: Areas of geographic fatty deposition noted within the liver parenchyma. Layering tiny calcified gallstones are evident. No intrahepatic or extrahepatic biliary dilation. Pancreas: No focal mass lesion. No dilatation of the main duct. No intraparenchymal cyst. No peripancreatic edema. Spleen: No splenomegaly. No focal mass lesion. Adrenals/Urinary Tract: No adrenal nodule or mass. 2 mm nonobstructing stone identified interpolar right kidney and a 1 mm nonobstructing stone is identified in the lower pole right kidney. 2 mm nonobstructing stone identified upper pole left kidney with a 1 mm interpolar left renal stone. 7.2 cm lesion upper pole left kidney was 6.5 cm on the study from over 5 years ago and averages water attenuation today, compatible with a cyst. A second water density lesion in the interpolar left kidney measures 3.1 cm today compared to 1.8 cm previously. No hydronephrosis in either kidney. No hydroureter. No ureteral or bladder stones. Stomach/Bowel: Stomach is nondistended. No gastric wall thickening. No evidence of outlet obstruction. Duodenum is normally positioned as is the ligament of Treitz. No small bowel wall thickening. No small bowel dilatation. The terminal ileum is normal. The appendix is normal. Diverticular changes are noted in the left colon without evidence of diverticulitis. Vascular/Lymphatic: There is abdominal aortic atherosclerosis without aneurysm. There is no gastrohepatic or hepatoduodenal ligament lymphadenopathy. No intraperitoneal or retroperitoneal lymphadenopathy. No pelvic sidewall lymphadenopathy. Upper normal lymph nodes are identified in the groin region bilaterally. Reproductive: The prostate gland and seminal vesicles have normal imaging features. Other: No intraperitoneal free fluid. Musculoskeletal: Bone windows reveal no worrisome lytic or  sclerotic osseous lesions. Compression deformity at the L4 vertebral body is new since the prior study but age indeterminate on today's study. IMPRESSION: 1. Bilateral nephrolithiasis without hydroureteronephrosis. No ureteral or bladder stones. 2. Left renal cysts, progressed only minimally in the more than 5 year interval since prior study. 3.  Abdominal Aortic Atherosclerois (ICD10-170.0) Electronically Signed   By: [name] M.D.   On: 01/20/2017 16:23    Assessment & Plan:   Terrall was seen today for foot pain.  Diagnoses and all orders for this visit:  Need for influenza vaccination -     Flu vaccine HIGH DOSE PF (Fluzone High dose)  Diabetic infection of right foot ([place])- his plain film is negative for subcutaneous air or evidence of osteomyelitis. I'm concerned that the callous and associated areas of the foot may have a subtle infection so will empirically treat with Augmentin. I've also asked him to follow-up with his podiatrist to evaluate and treat the callus. -     DG Foot Complete Right; Future -     Ambulatory referral to  Podiatry -     amoxicillin-clavulanate (AUGMENTIN) 875-125 MG tablet; Take 1 tablet by mouth 2 (two) times daily.   I am having Mr. Oshima start on amoxicillin-clavulanate. I am also having him maintain his aspirin EC, blood glucose meter kit and supplies, TRUE METRIX METER, TRUEPLUS LANCETS 33G, glimepiride, metFORMIN, simvastatin, and TRUE METRIX BLOOD GLUCOSE TEST.  Meds ordered this encounter  Medications  . amoxicillin-clavulanate (AUGMENTIN) 875-125 MG tablet    Sig: Take 1 tablet by mouth 2 (two) times daily.    Dispense:  20 tablet    Refill:  0     Follow-up: Return in about 1 week (around 07/19/2017).  Scarlette Calico, MD

## 2017-07-19 ENCOUNTER — Encounter: Payer: Self-pay | Admitting: Internal Medicine

## 2017-07-19 ENCOUNTER — Ambulatory Visit (INDEPENDENT_AMBULATORY_CARE_PROVIDER_SITE_OTHER): Payer: Medicare HMO | Admitting: Internal Medicine

## 2017-07-19 VITALS — BP 124/54 | HR 67 | Temp 98.3°F | Resp 16 | Ht >= 80 in | Wt 308.0 lb

## 2017-07-19 DIAGNOSIS — E1151 Type 2 diabetes mellitus with diabetic peripheral angiopathy without gangrene: Secondary | ICD-10-CM

## 2017-07-19 DIAGNOSIS — L97519 Non-pressure chronic ulcer of other part of right foot with unspecified severity: Secondary | ICD-10-CM

## 2017-07-19 DIAGNOSIS — I70209 Unspecified atherosclerosis of native arteries of extremities, unspecified extremity: Secondary | ICD-10-CM

## 2017-07-19 NOTE — Patient Instructions (Signed)

## 2017-07-19 NOTE — Progress Notes (Signed)
Subjective:  Patient ID: Jason [name], male    DOB: [birth year]  Age: 76 y.o. MRN: [id number]  CC: Recurrent Skin Infections   HPI Jason [name] presents for a recheck of his right foot. He reports of decrease of pain, redness, and swelling since I last saw him. He has not seen his foot doctor yet. He is taking the Augmentin with no side effects.  Outpatient Medications Prior to Visit  Medication Sig Dispense Refill  . amoxicillin-clavulanate (AUGMENTIN) 875-125 MG tablet Take 1 tablet by mouth 2 (two) times daily. 20 tablet 0  . aspirin EC 81 MG tablet Take 1 tablet (81 mg total) by mouth daily. 90 tablet 3  . blood glucose meter kit and supplies KIT Use to test blood sugar up to 3 times daily. DX E11.9 1 each 0  . Blood Glucose Monitoring Suppl (TRUE METRIX METER) w/Device KIT Use to check blood sugar DX E11.8 1 kit 0  . glimepiride (AMARYL) 2 MG tablet Take 1 tablet (2 mg total) by mouth daily with breakfast. 90 tablet 1  . metFORMIN (GLUCOPHAGE) 500 MG tablet Take 1 tablet (500 mg total) by mouth 2 (two) times daily with a meal. 14 tablet 0  . simvastatin (ZOCOR) 40 MG tablet Take 1 tablet (40 mg total) by mouth every evening. 7 tablet 0  . TRUE METRIX BLOOD GLUCOSE TEST test strip USE  TO TEST BLOOD SUGAR  UP  TO THREE TIMES DAILY 300 each 3  . TRUEPLUS LANCETS 33G MISC USE  TO TEST BLOOD SUGAR  UP  TO THREE TIMES DAILY 300 each 3   No facility-administered medications prior to visit.     ROS Review of Systems  Constitutional: Negative for chills, fatigue and fever.  HENT: Negative.   Eyes: Negative.   Respiratory: Negative.  Negative for cough, shortness of breath and wheezing.   Cardiovascular: Negative for chest pain, palpitations and leg swelling.  Gastrointestinal: Negative for abdominal pain, constipation, diarrhea, nausea and vomiting.  Endocrine: Negative.   Genitourinary: Negative.  Negative for difficulty urinating.  Musculoskeletal: Negative.   Skin: Negative.   Negative for color change, pallor and wound.  Allergic/Immunologic: Negative.   Neurological: Negative.  Negative for dizziness and weakness.  Hematological: Negative.  Negative for adenopathy. Does not bruise/bleed easily.  Psychiatric/Behavioral: Negative.     Objective:  BP (!) 124/54 (BP Location: Left Arm, Patient Position: Sitting, Cuff Size: Large)   Pulse 67   Temp 98.3 F (36.8 C) (Oral)   Resp 16   Ht _0  (2.032 m)   Wt (!) 308 lb (139.7 kg)   SpO2 98%   BMI 33.84 kg/m   BP Readings from Last 3 Encounters:  07/19/17 (!) 124/54  07/12/17 (!) 116/56  05/17/17 128/64    Wt Readings from Last 3 Encounters:  07/19/17 (!) 308 lb (139.7 kg)  07/12/17 (!) 312 lb 8 oz (141.7 kg)  05/17/17 (!) 310 lb (140.6 kg)    Physical Exam  Constitutional: He is oriented to person, place, and time. No distress.  HENT:  Mouth/Throat: Oropharynx is clear and moist. No oropharyngeal exudate.  Eyes: Conjunctivae are normal. Right eye exhibits no discharge. Left eye exhibits no discharge. No scleral icterus.  Neck: Normal range of motion. Neck supple. No JVD present. No thyromegaly present.  Cardiovascular: Normal rate, regular rhythm and intact distal pulses.  Exam reveals no gallop and no friction rub.   No murmur heard. Pulmonary/Chest: Effort normal and breath sounds normal.  No respiratory distress. He has no wheezes. He has no rales. He exhibits no tenderness.  Abdominal: Soft. Bowel sounds are normal. He exhibits no distension and no mass. There is no tenderness. There is no rebound and no guarding.  Musculoskeletal: Normal range of motion. He exhibits no edema or tenderness.       Right foot: There is deformity (prior amputations). There is normal range of motion, no tenderness, no bony tenderness, no swelling, normal capillary refill, no crepitus and no laceration.  Right foot shows callus formation on the plantar side and areas of PIPA distally but there is no evidence of ulcer  or wound infection.  Lymphadenopathy:    He has no cervical adenopathy.  Neurological: He is alert and oriented to person, place, and time.  Skin: Skin is warm and dry. No rash noted. He is not diaphoretic. No erythema. No pallor.  Vitals reviewed.   Lab Results  Component Value Date   WBC 4.4 01/17/2017   HGB 13.9 01/17/2017   HCT 41.9 01/17/2017   PLT 150.0 01/17/2017   GLUCOSE 224 (H) 05/17/2017   CHOL 136 01/17/2017   TRIG 57.0 01/17/2017   HDL 45.80 01/17/2017   LDLCALC 79 01/17/2017   ALT 17 01/17/2017   AST 14 01/17/2017   NA 139 05/17/2017   K 4.0 05/17/2017   CL 102 05/17/2017   CREATININE 1.06 05/17/2017   BUN 11 05/17/2017   CO2 31 05/17/2017   TSH 1.31 01/17/2017   PSA 0.35 05/17/2017   INR 1.05 10/17/2012   HGBA1C 7.3 (H) 05/17/2017   MICROALBUR 2.9 (H) 01/17/2017    Dg Foot Complete Right  Result Date: 07/12/2017 CLINICAL DATA:  Right foot and toe pain. Diabetic infection of the right foot. EXAM: RIGHT FOOT COMPLETE - 3+ VIEW COMPARISON:  05/29/2009 FINDINGS: The patient has had prior amputation of the first, second and third toes. Old healed fractures of the distal third and fourth metatarsals. There are no fractures and there is no dislocation. No bone destruction to suggest osteomyelitis. Arthritic changes of the midfoot with a flatfoot deformity. IMPRESSION: No acute abnormality. Specifically, no radiographic evidence suggestive of osteomyelitis. Electronically Signed   By: Jason [name] M.D.   On: 07/12/2017 08:58    Assessment & Plan:   Jason [name] was seen today for recurrent skin infections.  Diagnoses and all orders for this visit:  Diabetes mellitus type 2 with atherosclerosis of arteries of extremities ([place])- no complications noted today.  Foot ulcer, right, with unspecified severity ([place])- improvement noted with a course of Augmentin. I have encouraged him to see his podiatrist to see if anything can be done about the callus on his foot.   I  am having Jason [name] maintain his aspirin EC, blood glucose meter kit and supplies, TRUE METRIX METER, TRUEPLUS LANCETS 33G, glimepiride, metFORMIN, simvastatin, TRUE METRIX BLOOD GLUCOSE TEST, and amoxicillin-clavulanate.  No orders of the defined types were placed in this encounter.    Follow-up: Return in about 3 months (around 10/18/2017).  Scarlette Calico, MD

## 2017-07-20 ENCOUNTER — Encounter: Payer: Self-pay | Admitting: Internal Medicine

## 2017-07-20 DIAGNOSIS — J441 Chronic obstructive pulmonary disease with (acute) exacerbation: Secondary | ICD-10-CM | POA: Diagnosis not present

## 2017-07-27 ENCOUNTER — Ambulatory Visit (INDEPENDENT_AMBULATORY_CARE_PROVIDER_SITE_OTHER): Payer: Medicare HMO | Admitting: Podiatry

## 2017-07-27 DIAGNOSIS — B351 Tinea unguium: Secondary | ICD-10-CM | POA: Diagnosis not present

## 2017-07-27 DIAGNOSIS — L84 Corns and callosities: Secondary | ICD-10-CM | POA: Diagnosis not present

## 2017-07-27 DIAGNOSIS — E0842 Diabetes mellitus due to underlying condition with diabetic polyneuropathy: Secondary | ICD-10-CM

## 2017-07-27 DIAGNOSIS — M79676 Pain in unspecified toe(s): Secondary | ICD-10-CM | POA: Diagnosis not present

## 2017-07-29 NOTE — Progress Notes (Signed)
   Subjective: Patient with diabetes mellitus presents to the office today for chief complaint of a painful callus lesion to the right forefoot that appeared 3 weeks ago. He reports associated drainage from the area. He was seen by his PCP and has completed a course of Augmentin. He also complains of elongated, thickened nails which causes pain while ambulating in shoes. He is unable to trim his own nails. Patient presents today for further treatment and evaluation.   Past Medical History:  Diagnosis Date  . Anxiety   . Asthma    in past, no inhaler now  . Benign neoplasm of colon   . Depression   . Diverticulosis of colon (without mention of hemorrhage)   . DJD (degenerative joint disease)   . Esophageal reflux   . Glaucoma   . Hidradenitis   . History of BPH   . History of gynecomastia    Unilateral  . Hyperlipidemia   . Irritable bowel syndrome   . Memory loss   . MVP (mitral valve prolapse)   . Obesity, unspecified   . Sleep apnea    does not use  cpap or bipap .. no study  ever  . Type II or unspecified type diabetes mellitus without mention of complication, not stated as uncontrolled   . Unspecified venous (peripheral) insufficiency      Objective:  Physical Exam General: Alert and oriented x3 in no acute distress  Dermatology: Hyperkeratotic lesion present on the right forefoot. Pain on palpation with a central nucleated core noted. Nails are tender, long, thickened and dystrophic with subungual debris, consistent with onychomycosis, 1-5 bilateral. No signs of infection noted. Skin is warm, dry and supple bilateral lower extremities. Negative for open lesions or macerations.  Vascular: Palpable pedal pulses bilaterally. No edema or erythema noted. Capillary refill within normal limits.  Neurological: Epicritic and protective threshold diminished bilaterally.   Musculoskeletal Exam: Pain on palpation at the keratotic lesion noted. Range of motion within normal limits  bilateral. Muscle strength 5/5 in all groups bilateral.  Assessment: #1 Diabetes mellitus w/ peripheral neuropathy #2 Pre-ulcerative callus lesion right plantar forefoot #3 Onychomycosis of nail due to dermatophyte bilateral #4 Pain in feet   Plan of Care:  #1 Patient evaluated. #2 Excisional debridement of keratotic lesion using a chisel blade was performed without incident.  #3 Dressed with light dressing. #4 Mechanical debridement of nails 1-5 bilaterally performed using a nail nipper. Filed with dremel without incident.  #5 Patient is to return to the clinic in 3 months.    Edrick Kins, DPM [facility]  Dr. Edrick Kins, [address]                                        [place], [place] [address]                Office [phone]  Fax [phone]

## 2017-08-02 ENCOUNTER — Other Ambulatory Visit: Payer: Self-pay | Admitting: Internal Medicine

## 2017-08-06 IMAGING — CR DG CHEST 2V
2 series · 2 of 2 positions shown · non-contrast
Comparison: Chest radiograph 09/24/2015; 05/28/2014.

CLINICAL DATA: Cough for 3 weeks. Coughing up yellow sputum. No
chest pain.

EXAM:
CHEST  2 VIEW

[w chest pa]
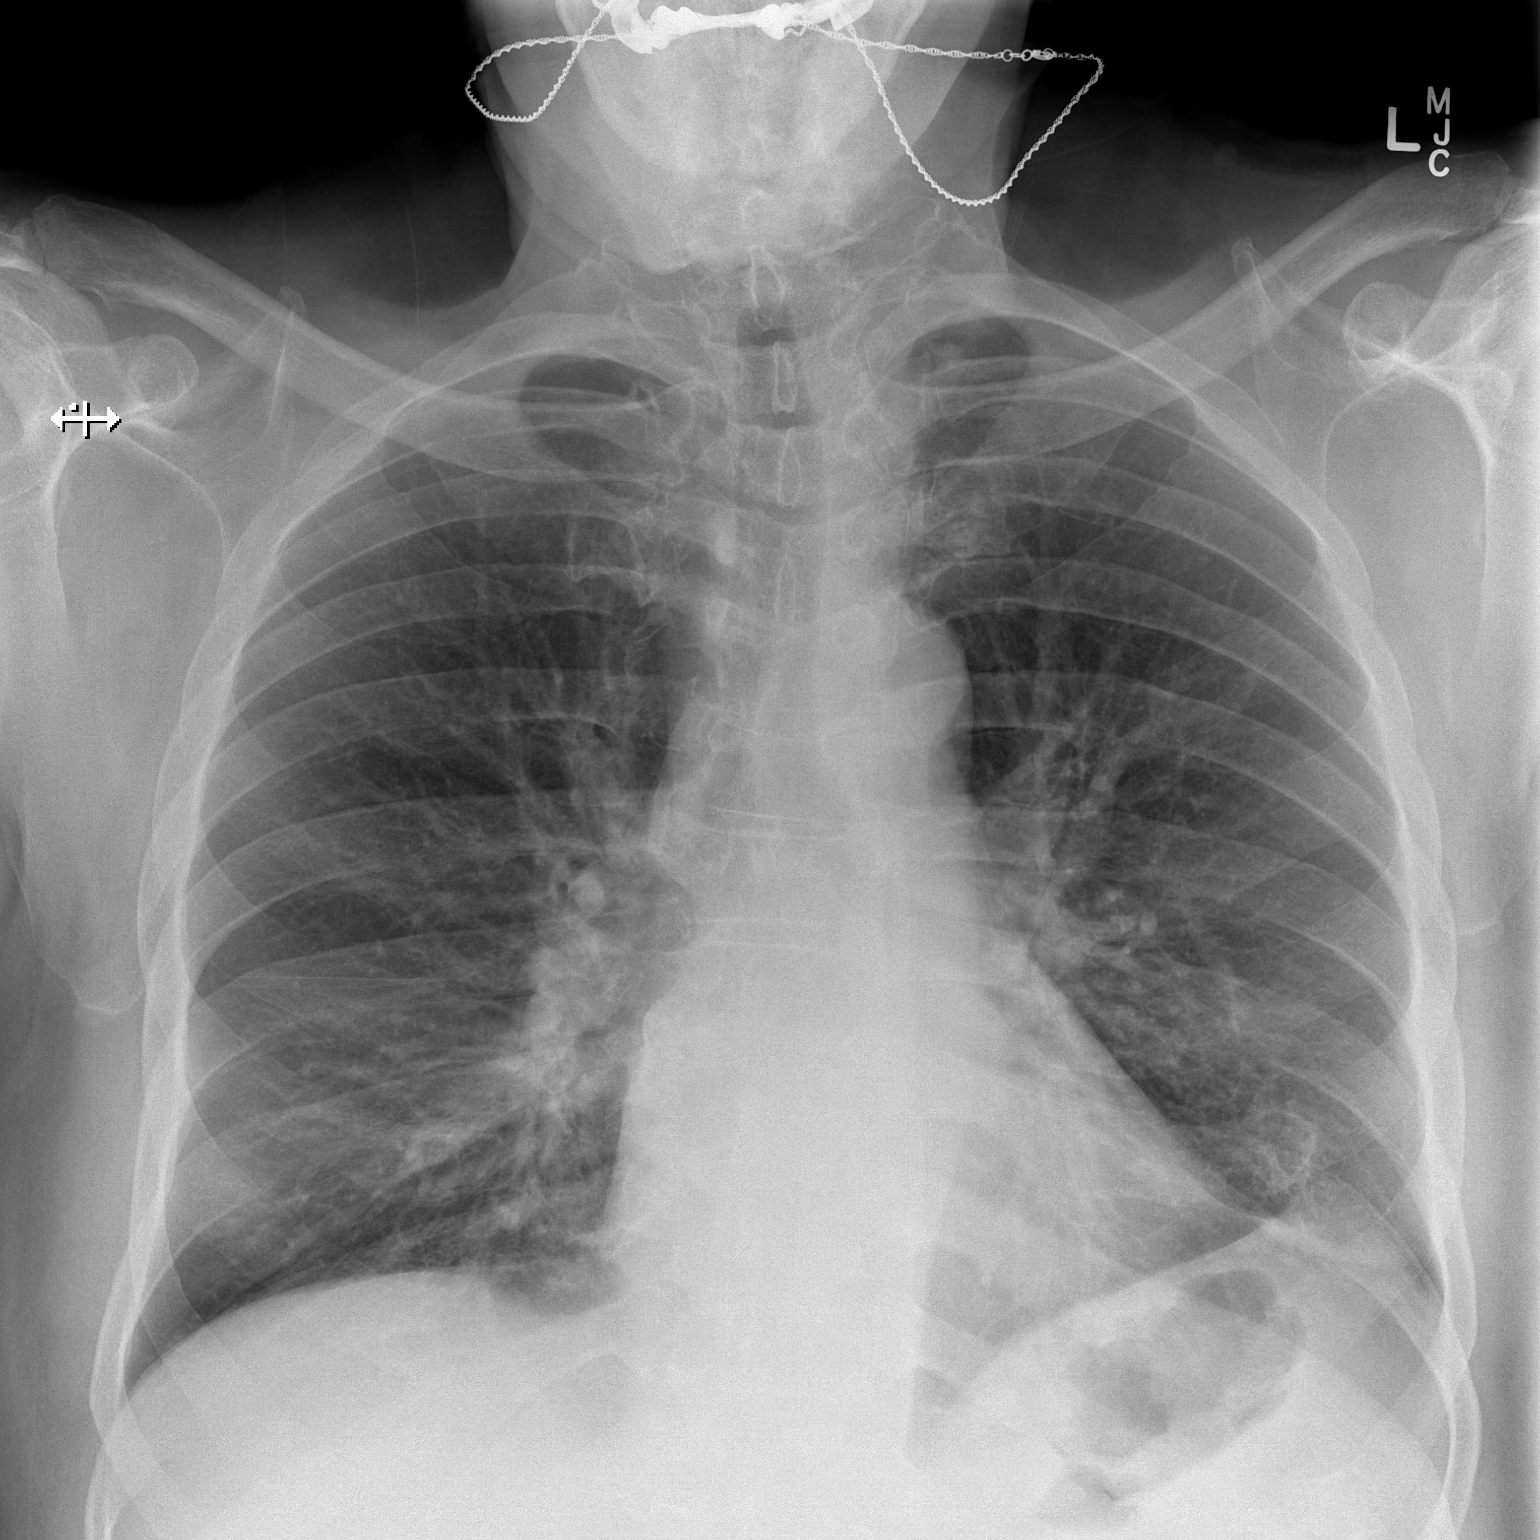

[w chest lat]
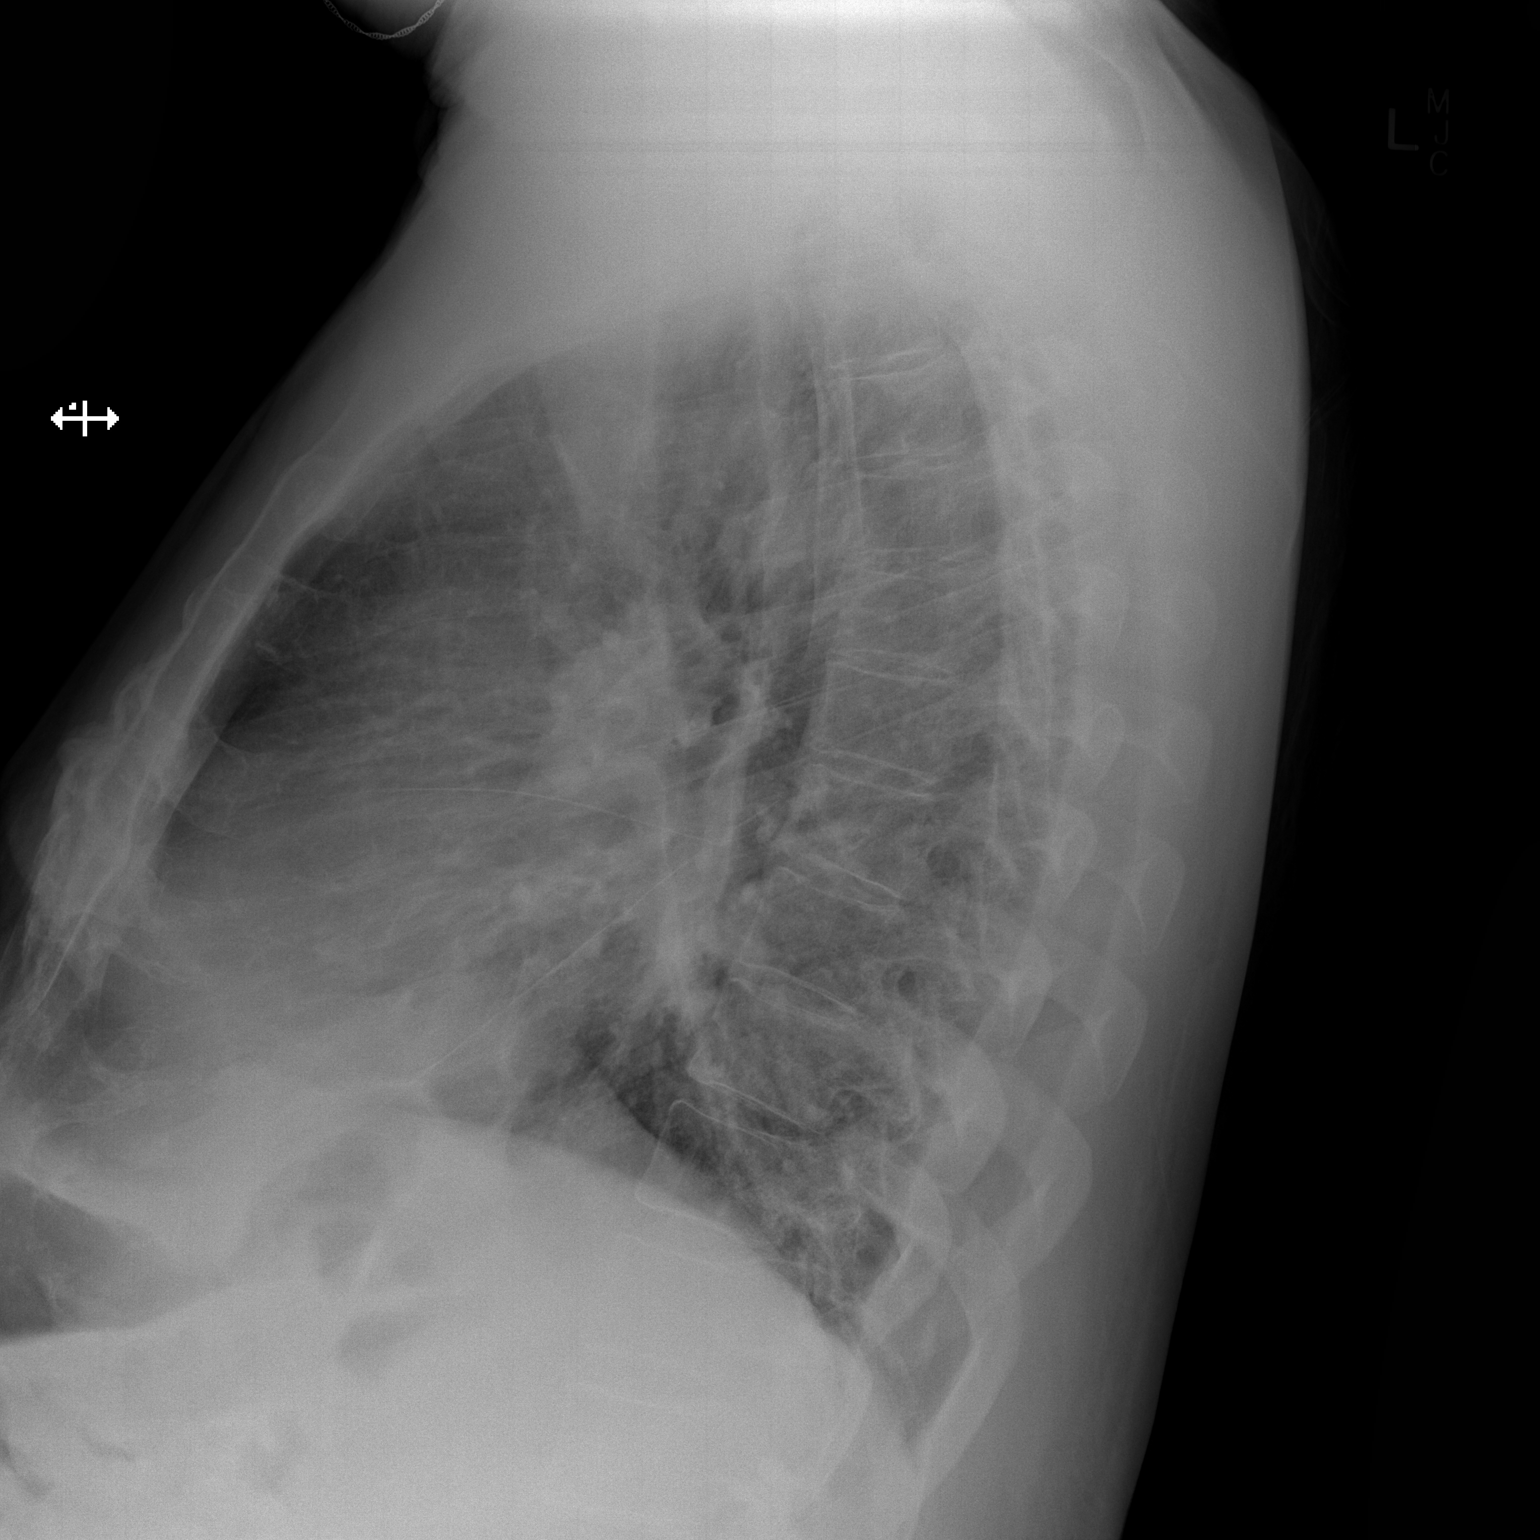

[2 of 2 positions shown; findings below may reference images not displayed]

FINDINGS: Stable cardiac and mediastinal contours. No large area of pulmonary
consolidation. No pleural effusion or pneumothorax. Regional
skeleton is unremarkable.
IMPRESSION: No active cardiopulmonary disease.

## 2017-08-10 ENCOUNTER — Ambulatory Visit: Payer: Medicare HMO | Admitting: Podiatry

## 2017-08-19 DIAGNOSIS — J441 Chronic obstructive pulmonary disease with (acute) exacerbation: Secondary | ICD-10-CM | POA: Diagnosis not present

## 2017-09-19 DIAGNOSIS — J441 Chronic obstructive pulmonary disease with (acute) exacerbation: Secondary | ICD-10-CM | POA: Diagnosis not present

## 2017-10-04 ENCOUNTER — Other Ambulatory Visit: Payer: Self-pay | Admitting: Internal Medicine

## 2017-10-04 ENCOUNTER — Ambulatory Visit: Payer: Medicare HMO | Admitting: Internal Medicine

## 2017-10-04 DIAGNOSIS — IMO0002 Reserved for concepts with insufficient information to code with codable children: Secondary | ICD-10-CM

## 2017-10-04 DIAGNOSIS — I70209 Unspecified atherosclerosis of native arteries of extremities, unspecified extremity: Principal | ICD-10-CM

## 2017-10-04 DIAGNOSIS — E1151 Type 2 diabetes mellitus with diabetic peripheral angiopathy without gangrene: Secondary | ICD-10-CM

## 2017-10-04 DIAGNOSIS — E118 Type 2 diabetes mellitus with unspecified complications: Secondary | ICD-10-CM

## 2017-10-04 DIAGNOSIS — E1165 Type 2 diabetes mellitus with hyperglycemia: Secondary | ICD-10-CM

## 2017-10-05 ENCOUNTER — Ambulatory Visit: Payer: Medicare HMO | Admitting: Podiatry

## 2017-10-05 ENCOUNTER — Encounter: Payer: Self-pay | Admitting: Podiatry

## 2017-10-05 DIAGNOSIS — E0842 Diabetes mellitus due to underlying condition with diabetic polyneuropathy: Secondary | ICD-10-CM

## 2017-10-05 DIAGNOSIS — L989 Disorder of the skin and subcutaneous tissue, unspecified: Secondary | ICD-10-CM | POA: Diagnosis not present

## 2017-10-10 ENCOUNTER — Ambulatory Visit: Payer: Medicare HMO | Admitting: Internal Medicine

## 2017-10-10 ENCOUNTER — Encounter: Payer: Self-pay | Admitting: Internal Medicine

## 2017-10-10 DIAGNOSIS — E1151 Type 2 diabetes mellitus with diabetic peripheral angiopathy without gangrene: Secondary | ICD-10-CM | POA: Diagnosis not present

## 2017-10-10 DIAGNOSIS — IMO0002 Reserved for concepts with insufficient information to code with codable children: Secondary | ICD-10-CM

## 2017-10-10 DIAGNOSIS — E118 Type 2 diabetes mellitus with unspecified complications: Secondary | ICD-10-CM | POA: Diagnosis not present

## 2017-10-10 DIAGNOSIS — I70209 Unspecified atherosclerosis of native arteries of extremities, unspecified extremity: Secondary | ICD-10-CM

## 2017-10-10 DIAGNOSIS — E1165 Type 2 diabetes mellitus with hyperglycemia: Secondary | ICD-10-CM

## 2017-10-10 LAB — POCT GLYCOSYLATED HEMOGLOBIN (HGB A1C): Hemoglobin A1C: 8.3

## 2017-10-10 MED ORDER — GLIMEPIRIDE 2 MG PO TABS: 2.0000 mg | ORAL_TABLET | Freq: Every day | ORAL | 1 refills | Status: DC

## 2017-10-10 MED ORDER — METFORMIN HCL 500 MG PO TABS: 500.0000 mg | ORAL_TABLET | Freq: Two times a day (BID) | ORAL | 1 refills | Status: DC

## 2017-10-10 MED ORDER — SIMVASTATIN 40 MG PO TABS: 40.0000 mg | ORAL_TABLET | Freq: Every evening | ORAL | 1 refills | Status: DC

## 2017-10-10 NOTE — Patient Instructions (Signed)

## 2017-10-10 NOTE — Progress Notes (Signed)
Subjective:  Patient ID: Jason [name], male    DOB: 15-Dec-1940  Age: 76 y.o. MRN: [id number]  CC: Diabetes   HPI Jason [name] presents for f/up -since I last saw him he has not been adhering to his dietary or lifestyle modifications and is gained weight.  He also thinks his blood sugars have gone up as he has had some episodes of polyuria and polyphagia.  He otherwise feels well and denies chest pain, shortness of breath, or fatigue.  Outpatient Medications Prior to Visit  Medication Sig Dispense Refill  . aspirin EC 81 MG tablet Take 1 tablet (81 mg total) by mouth daily. 90 tablet 3  . blood glucose meter kit and supplies KIT Use to test blood sugar up to 3 times daily. DX E11.9 1 each 0  . Blood Glucose Monitoring Suppl (TRUE METRIX METER) w/Device KIT Use to check blood sugar DX E11.8 1 kit 0  . TRUE METRIX BLOOD GLUCOSE TEST test strip USE  TO TEST BLOOD SUGAR  UP  TO THREE TIMES DAILY 300 each 3  . TRUEPLUS LANCETS 33G MISC USE  TO TEST BLOOD SUGAR  UP  TO THREE TIMES DAILY 300 each 3  . glimepiride (AMARYL) 2 MG tablet TAKE 1 TABLET (2 MG TOTAL) BY MOUTH DAILY WITH BREAKFAST. 90 tablet 1  . metFORMIN (GLUCOPHAGE) 500 MG tablet TAKE 1 TABLET TWICE DAILY WITH MEALS 180 tablet 1  . simvastatin (ZOCOR) 40 MG tablet Take 1 tablet (40 mg total) by mouth every evening. 7 tablet 0   No facility-administered medications prior to visit.     ROS Review of Systems  Constitutional: Negative for diaphoresis and fatigue.  HENT: Negative.   Eyes: Negative.  Negative for visual disturbance.  Respiratory: Negative.  Negative for cough, chest tightness, shortness of breath and wheezing.   Cardiovascular: Negative.  Negative for chest pain, palpitations and leg swelling.  Gastrointestinal: Negative.  Negative for abdominal pain, blood in stool, constipation, diarrhea and vomiting.  Endocrine: Positive for polyphagia and polyuria. Negative for polydipsia.  Genitourinary: Negative.  Negative  for difficulty urinating.  Musculoskeletal: Negative.  Negative for arthralgias and myalgias.  Skin: Negative.  Negative for color change and rash.  Allergic/Immunologic: Negative.   Neurological: Negative.  Negative for dizziness, weakness and light-headedness.  Hematological: Negative for adenopathy. Does not bruise/bleed easily.  Psychiatric/Behavioral: Negative.     Objective:  BP 130/60 (BP Location: Left Arm, Patient Position: Sitting, Cuff Size: Large)   Pulse 67   Temp 98.5 F (36.9 C) (Oral)   Ht _0  (2.032 m)   Wt (!) 312 lb 12 oz (141.9 kg)   SpO2 97%   BMI 34.36 kg/m   BP Readings from Last 3 Encounters:  10/10/17 130/60  07/19/17 (!) 124/54  07/12/17 (!) 116/56    Wt Readings from Last 3 Encounters:  10/10/17 (!) 312 lb 12 oz (141.9 kg)  07/19/17 (!) 308 lb (139.7 kg)  07/12/17 (!) 312 lb 8 oz (141.7 kg)    Physical Exam  Constitutional: He is oriented to person, place, and time. No distress.  HENT:  Mouth/Throat: Oropharynx is clear and moist. No oropharyngeal exudate.  Eyes: Conjunctivae are normal.  Neck: Normal range of motion. Neck supple.  Cardiovascular: Normal rate and regular rhythm. Exam reveals no gallop.  No murmur heard. Pulmonary/Chest: Effort normal and breath sounds normal. He has no wheezes. He has no rales.  Abdominal: Soft. Bowel sounds are normal. He exhibits no distension and  no mass. There is no tenderness. There is no guarding.  Musculoskeletal: Normal range of motion. He exhibits no edema or tenderness.  Neurological: He is alert and oriented to person, place, and time.  Skin: Skin is warm. He is not diaphoretic.  Vitals reviewed.   Lab Results  Component Value Date   WBC 4.4 01/17/2017   HGB 13.9 01/17/2017   HCT 41.9 01/17/2017   PLT 150.0 01/17/2017   GLUCOSE 224 (H) 05/17/2017   CHOL 136 01/17/2017   TRIG 57.0 01/17/2017   HDL 45.80 01/17/2017   LDLCALC 79 01/17/2017   ALT 17 01/17/2017   AST 14 01/17/2017   NA  139 05/17/2017   K 4.0 05/17/2017   CL 102 05/17/2017   CREATININE 1.06 05/17/2017   BUN 11 05/17/2017   CO2 31 05/17/2017   TSH 1.31 01/17/2017   PSA 0.35 05/17/2017   INR 1.05 10/17/2012   HGBA1C 8.3 10/10/2017   MICROALBUR 2.9 (H) 01/17/2017    Dg Foot Complete Right  Result Date: 07/12/2017 CLINICAL DATA:  Right foot and toe pain. Diabetic infection of the right foot. EXAM: RIGHT FOOT COMPLETE - 3+ VIEW COMPARISON:  05/29/2009 FINDINGS: The patient has had prior amputation of the first, second and third toes. Old healed fractures of the distal third and fourth metatarsals. There are no fractures and there is no dislocation. No bone destruction to suggest osteomyelitis. Arthritic changes of the midfoot with a flatfoot deformity. IMPRESSION: No acute abnormality. Specifically, no radiographic evidence suggestive of osteomyelitis. Electronically Signed   By: Lorriane Shire M.D.   On: 07/12/2017 08:58    Assessment & Plan:   [place] was seen today for diabetes.  Diagnoses and all orders for this visit:  Diabetes mellitus type 2 with atherosclerosis of arteries of extremities ([place])- as below -     glimepiride (AMARYL) 2 MG tablet; Take 1 tablet (2 mg total) by mouth daily with breakfast. -     POCT glycosylated hemoglobin (Hb A1C)  Uncontrolled type 2 diabetes mellitus with complication, without long-term current use of insulin ([place])- His A1c is up to 8.3%.  I recommended that he add another medication or increase the dose of metformin but he is not willing to do that.  He wants to control his blood sugars with better lifestyle modifications.  For now will continue the same dose of metformin and the sulfonylurea. -     glimepiride (AMARYL) 2 MG tablet; Take 1 tablet (2 mg total) by mouth daily with breakfast.  Other orders -     simvastatin (ZOCOR) 40 MG tablet; Take 1 tablet (40 mg total) by mouth every evening. -     metFORMIN (GLUCOPHAGE) 500 MG tablet; Take 1 tablet (500 mg  total) by mouth 2 (two) times daily with a meal.   I have changed Creig Hines S. Rattan's metFORMIN. I am also having him maintain his aspirin EC, blood glucose meter kit and supplies, TRUE METRIX METER, TRUEPLUS LANCETS 33G, TRUE METRIX BLOOD GLUCOSE TEST, glimepiride, and simvastatin.  Meds ordered this encounter  Medications  . glimepiride (AMARYL) 2 MG tablet    Sig: Take 1 tablet (2 mg total) by mouth daily with breakfast.    Dispense:  90 tablet    Refill:  1  . simvastatin (ZOCOR) 40 MG tablet    Sig: Take 1 tablet (40 mg total) by mouth every evening.    Dispense:  90 tablet    Refill:  1    Week supply until  mail service comes in  . metFORMIN (GLUCOPHAGE) 500 MG tablet    Sig: Take 1 tablet (500 mg total) by mouth 2 (two) times daily with a meal.    Dispense:  180 tablet    Refill:  1     Follow-up: Return in about 4 months (around 02/07/2018).  Scarlette Calico, MD

## 2017-10-11 NOTE — Progress Notes (Signed)
   Subjective: Patient with diabetes mellitus presents to the office today for follow up evaluation of a pre-ulcerative callus to the plantar aspect of the right foot. He states he feels as if the callus is "loose" and reports associated bleeding from the area for the past week. There are no modifying factors noted. He has not done anything to treat the area. Patient presents today for further treatment and evaluation.   Past Medical History:  Diagnosis Date  . Anxiety   . Asthma    in past, no inhaler now  . Benign neoplasm of colon   . Depression   . Diverticulosis of colon (without mention of hemorrhage)   . DJD (degenerative joint disease)   . Esophageal reflux   . Glaucoma   . Hidradenitis   . History of BPH   . History of gynecomastia    Unilateral  . Hyperlipidemia   . Irritable bowel syndrome   . Memory loss   . MVP (mitral valve prolapse)   . Obesity, unspecified   . Sleep apnea    does not use  cpap or bipap .. no study  ever  . Type II or unspecified type diabetes mellitus without mention of complication, not stated as uncontrolled   . Unspecified venous (peripheral) insufficiency      Objective:  Physical Exam General: Alert and oriented x3 in no acute distress  Dermatology: Hyperkeratotic lesion present on the plantar aspect of the right foot. Pain on palpation with a central nucleated core noted.  Skin is warm, dry and supple bilateral lower extremities. Negative for open lesions or macerations.  Vascular: Palpable pedal pulses bilaterally. No edema or erythema noted. Capillary refill within normal limits.  Neurological: Epicritic and protective threshold diminished bilaterally.   Musculoskeletal Exam: Pain on palpation at the keratotic lesion noted. Range of motion within normal limits bilateral. Muscle strength 5/5 in all groups bilateral.  Assessment: #1 Diabetes mellitus w/ peripheral neuropathy #2 Pre-ulcerative callus lesion right plantar foot     Plan of Care:  #1 Patient evaluated #2 Excisional debridement of keratotic lesion using a chisel blade was performed without incident.  #3 Dressed area with light dressing. #4 Discussed possibility of future metatarsal head resection to alleviate pressure of the callus lesion.  #5 Patient is to return to the clinic in 8 weeks.    Edrick Kins, DPM [facility]  Dr. Edrick Kins, [place]                                        [place], [place] [address]                Office [phone]  Fax [phone]

## 2017-10-19 DIAGNOSIS — J441 Chronic obstructive pulmonary disease with (acute) exacerbation: Secondary | ICD-10-CM | POA: Diagnosis not present

## 2017-10-26 ENCOUNTER — Ambulatory Visit: Payer: Medicare HMO | Admitting: Podiatry

## 2017-10-26 DIAGNOSIS — L97512 Non-pressure chronic ulcer of other part of right foot with fat layer exposed: Secondary | ICD-10-CM | POA: Diagnosis not present

## 2017-10-26 DIAGNOSIS — E0842 Diabetes mellitus due to underlying condition with diabetic polyneuropathy: Secondary | ICD-10-CM | POA: Diagnosis not present

## 2017-10-26 DIAGNOSIS — I70235 Atherosclerosis of native arteries of right leg with ulceration of other part of foot: Secondary | ICD-10-CM | POA: Diagnosis not present

## 2017-10-26 MED ORDER — SULFAMETHOXAZOLE-TRIMETHOPRIM 800-160 MG PO TABS: 1.0000 | ORAL_TABLET | Freq: Two times a day (BID) | ORAL | 0 refills | Status: DC

## 2017-10-27 ENCOUNTER — Telehealth: Payer: Self-pay | Admitting: *Deleted

## 2017-10-27 NOTE — Telephone Encounter (Signed)
-----   Message from Edrick Kins, DPM sent at 10/26/2017 11:50 AM EST ----- Regarding: dressing supplies Could we please order this patient some dressing supplies times 90 days -4 x 4 gauze -Aquacel Ag -4" kerlex/kling -4" coban or ace  Dx: Diabetic foot ulcer right.   Thanks, Dr. Amalia Hailey

## 2017-10-28 DIAGNOSIS — L97512 Non-pressure chronic ulcer of other part of right foot with fat layer exposed: Secondary | ICD-10-CM | POA: Diagnosis not present

## 2017-10-28 DIAGNOSIS — E0842 Diabetes mellitus due to underlying condition with diabetic polyneuropathy: Secondary | ICD-10-CM | POA: Diagnosis not present

## 2017-10-28 DIAGNOSIS — I70235 Atherosclerosis of native arteries of right leg with ulceration of other part of foot: Secondary | ICD-10-CM | POA: Diagnosis not present

## 2017-10-28 NOTE — Telephone Encounter (Signed)
Faxed required form, clinicals and demographics to [facility].

## 2017-10-28 NOTE — Telephone Encounter (Signed)
LOV completed with wound measurements.  Thanks, Dr. Amalia Hailey

## 2017-10-28 NOTE — Progress Notes (Signed)
   Subjective:  Patient with a history of diabetes mellitus presents today for follow-up evaluation regarding a pre-ulcerative lesion to the plantar aspect of the right foot secondary to diabetes mellitus.  Last visit on 10/05/2017 the lesion was a pre-ulcerative lesion with callus formation.  There is no open wound present.  He has been wearing his diabetic shoes.  He presents today for further treatment and evaluation  Objective/Physical Exam General: The patient is alert and oriented x3 in no acute distress.  Dermatology:  Wound #1 noted to the plantar aspect of the right foot measuring approximately 4.0 x 1.5 x 0.3 cm (LxWxD).   To the noted ulceration(s), there is no eschar. There is a moderate amount of slough, fibrin, and necrotic tissue noted. Granulation tissue and wound base is red. There is a minimal amount of serosanguineous drainage noted. There is no exposed bone muscle-tendon ligament or joint. There is no malodor. Periwound integrity is intact. Skin is warm, dry and supple bilateral lower extremities.  Vascular: Palpable pedal pulses bilaterally. No edema or erythema noted. Capillary refill within normal limits.  Neurological: Epicritic and protective threshold absent bilaterally.   Musculoskeletal Exam: Range of motion within normal limits to all pedal and ankle joints bilateral. Muscle strength 5/5 in all groups bilateral.   Assessment: #1  Ulcer right foot secondary to diabetes mellitus #2 diabetes mellitus w/ peripheral neuropathy   Plan of Care:  #1 Patient was evaluated. #2 medically necessary excisional debridement including muscle and deep fascial tissue was performed using a tissue nipper and a chisel blade. Excisional debridement of all the necrotic nonviable tissue down to healthy bleeding viable tissue was performed with post-debridement measurements same as pre-. #3 the wound was cleansed and dry sterile dressing applied. #4  Prescription for Bactrim DS times  2 weeks #5 today postoperative shoe was dispensed.  Patient should be weightbearing in the postoperative shoe  #6 orders for home health dressing supplies were ordered  #7 patient is to return to clinic in 3 weeks.   Edrick Kins, DPM [facility]  Dr. Edrick Kins, [place]                                        [place], [place] [address]                Office [phone]  Fax [phone]

## 2017-11-16 ENCOUNTER — Ambulatory Visit (INDEPENDENT_AMBULATORY_CARE_PROVIDER_SITE_OTHER): Payer: Medicare HMO | Admitting: Podiatry

## 2017-11-16 ENCOUNTER — Encounter: Payer: Self-pay | Admitting: Podiatry

## 2017-11-16 DIAGNOSIS — L989 Disorder of the skin and subcutaneous tissue, unspecified: Secondary | ICD-10-CM

## 2017-11-16 DIAGNOSIS — D2371 Other benign neoplasm of skin of right lower limb, including hip: Secondary | ICD-10-CM

## 2017-11-16 DIAGNOSIS — E0842 Diabetes mellitus due to underlying condition with diabetic polyneuropathy: Secondary | ICD-10-CM

## 2017-11-16 DIAGNOSIS — D239 Other benign neoplasm of skin, unspecified: Secondary | ICD-10-CM | POA: Diagnosis not present

## 2017-11-16 DIAGNOSIS — I70235 Atherosclerosis of native arteries of right leg with ulceration of other part of foot: Secondary | ICD-10-CM

## 2017-11-16 NOTE — Patient Instructions (Signed)
Pre-Operative Instructions  Congratulations, you have decided to take an important step towards improving your quality of life.  You can be assured that the doctors and staff at [facility] will be with you every step of the way.  Here are some important things you should know:  1. Plan to be at the surgery center/hospital at least 1 (one) hour prior to your scheduled time, unless otherwise directed by the surgical center/hospital staff.  You must have a responsible adult accompany you, remain during the surgery and drive you home.  Make sure you have directions to the surgical center/hospital to ensure you arrive on time. 2. If you are having surgery at [facility] or [facility], you will need a copy of your medical history and physical form from your family physician within one month prior to the date of surgery. We will give you a form for your primary physician to complete.  3. We make every effort to accommodate the date you request for surgery.  However, there are times where surgery dates or times have to be moved.  We will contact you as soon as possible if a change in schedule is required.   4. No aspirin/ibuprofen for one week before surgery.  If you are on aspirin, any non-steroidal anti-inflammatory medications (Mobic, Aleve, Ibuprofen) should not be taken seven (7) days prior to your surgery.  You make take Tylenol for pain prior to surgery.  5. Medications - If you are taking daily heart and blood pressure medications, seizure, reflux, allergy, asthma, anxiety, pain or diabetes medications, make sure you notify the surgery center/hospital before the day of surgery so they can tell you which medications you should take or avoid the day of surgery. 6. No food or drink after midnight the night before surgery unless directed otherwise by surgical center/hospital staff. 7. No alcoholic beverages 24-hours prior to surgery.  No smoking 24-hours prior or 24-hours after  surgery. 8. Wear loose pants or shorts. They should be loose enough to fit over bandages, boots, and casts. 9. Don't wear slip-on shoes. Sneakers are preferred. 10. Bring your boot with you to the surgery center/hospital.  Also bring crutches or a walker if your physician has prescribed it for you.  If you do not have this equipment, it will be provided for you after surgery. 11. If you have not been contacted by the surgery center/hospital by the day before your surgery, call to confirm the date and time of your surgery. 12. Leave-time from work may vary depending on the type of surgery you have.  Appropriate arrangements should be made prior to surgery with your employer. 13. Prescriptions will be provided immediately following surgery by your doctor.  Fill these as soon as possible after surgery and take the medication as directed. Pain medications will not be refilled on weekends and must be approved by the doctor. 14. Remove nail polish on the operative foot and avoid getting pedicures prior to surgery. 15. Wash the night before surgery.  The night before surgery wash the foot and leg well with water and the antibacterial soap provided. Be sure to pay special attention to beneath the toenails and in between the toes.  Wash for at least three (3) minutes. Rinse thoroughly with water and dry well with a towel.  Perform this wash unless told not to do so by your physician.  Enclosed: 1 Ice pack (please put in freezer the night before surgery)   1 Hibiclens skin cleaner     Pre-op instructions  If you have any questions regarding the instructions, please do not hesitate to call our office.  [place]: [address], , [place] [address] -- [phone]  [place]: [address] [place], [place] [address] -- [phone]  [place]: [address]  [place], [place] [address] -- [phone]  [place]: [address], [address], [place], [place] [address] -- [phone]  Website:  [email or web address] 

## 2017-11-18 ENCOUNTER — Telehealth: Payer: Self-pay | Admitting: Podiatry

## 2017-11-18 NOTE — Telephone Encounter (Signed)
Called pt to see if he wanted to cancel his upcoming surgery after an e-mail I got from [facility] at [facility]. Pt stated he did not want to have surgery right now and may have it later. I asked if he wanted me to cancel his surgery for now and that he could call us when he is ready. Pt stated that he would appreciate me doing that. I told him I would notify Dr. Amalia Hailey and the surgical center.

## 2017-11-19 NOTE — Progress Notes (Signed)
   Subjective: 77 year old male with past medical history of T2DM presenting today for follow-up evaluation of a pre-ulcerative lesion to the subsecond metatarsal head of the right foot.  He reports associated pain of the area.  He reports completing his course of Bactrim DS.  Patient states that the pain is ongoing and is affecting their ability to ambulate without pain. Patient presents today for further treatment and evaluation.   Past Medical History:  Diagnosis Date  . Anxiety   . Asthma    in past, no inhaler now  . Benign neoplasm of colon   . Depression   . Diverticulosis of colon (without mention of hemorrhage)   . DJD (degenerative joint disease)   . Esophageal reflux   . Glaucoma   . Hidradenitis   . History of BPH   . History of gynecomastia    Unilateral  . Hyperlipidemia   . Irritable bowel syndrome   . Memory loss   . MVP (mitral valve prolapse)   . Obesity, unspecified   . Sleep apnea    does not use  cpap or bipap .. no study  ever  . Type II or unspecified type diabetes mellitus without mention of complication, not stated as uncontrolled   . Unspecified venous (peripheral) insufficiency      Objective:  Physical Exam General: Alert and oriented x3 in no acute distress  Dermatology: Hyperkeratotic lesion present on the subsecond metatarsal head of the right foot. Pain on palpation with a central nucleated core noted.  Skin is warm, dry and supple bilateral lower extremities. Negative for open lesions or macerations.  Vascular: Palpable pedal pulses bilaterally. No edema or erythema noted. Capillary refill within normal limits.  Neurological: Epicritic and protective threshold diminished bilaterally.   Musculoskeletal Exam: Pain on palpation at the keratotic lesion noted. Range of motion within normal limits bilateral. Muscle strength 5/5 in all groups bilateral.  Radiographic exam: Previous x-rays taken 07/12/17 demonstrate elongated metatarsals 2, 3 of the  right foot contributing to the pre-ulcerative callus lesions.  Assessment: #1 Diabetes mellitus w/ peripheral neuropathy #2 Pre-ulcerative callus lesion subsecond metatarsal head of the right foot  Plan of Care:  #1 Patient evaluated.  X-rays reviewed. #2 Excisional debridement of keratotic lesion using a chisel blade was performed without incident.  #3 Dressed area with light dressing. #4 Today we discussed the conservative versus surgical management of the presenting pathology. The patient opts for surgical management. All possible complications and details of the procedure were explained. All patient questions were answered. No guarantees were expressed or implied. #5 Authorization for surgery was initiated today. Surgery will consist of excision of second and third metatarsal head of the right foot.  Patient will require prophylactic antibiotic Bactrim postop. #6 return to clinic 1 week postop.    Edrick Kins, DPM [facility]  Dr. Edrick Kins, [place]                                        [place], [place] [address]                Office [phone]  Fax [phone]

## 2017-12-23 ENCOUNTER — Ambulatory Visit (INDEPENDENT_AMBULATORY_CARE_PROVIDER_SITE_OTHER): Payer: Medicare HMO

## 2017-12-23 ENCOUNTER — Ambulatory Visit: Payer: Medicare HMO | Admitting: Podiatry

## 2017-12-23 ENCOUNTER — Encounter: Payer: Self-pay | Admitting: Podiatry

## 2017-12-23 DIAGNOSIS — L97512 Non-pressure chronic ulcer of other part of right foot with fat layer exposed: Secondary | ICD-10-CM

## 2017-12-23 NOTE — Progress Notes (Signed)
  Subjective:  Patient ID: Jason [name], male    DOB: 22-Aug-1941,  MRN: [id number]  Chief Complaint  Patient presents with  . Foot Ulcer    Plantar forefoot right   "I noticed on Wednesday that my foot was red, swollen and had an odor"   77 y.o. male returns for wound care.  Noticed on Wednesday that his foot was red swollen and had no denies nausea vomiting fever chills.   Objective:  There were no vitals filed for this visit. General AA&O x3. Normal mood and affect.  Vascular Foot warm to touch.  Neurologic Sensation grossly diminished.  Dermatologic (Wound) Wound Location: R 2nd/3rd metatarsal area. Wound Measurement: 6cm x 4 cm x 0.2 cm post-debridement. Wound Base: Mixed Granular/Fibrotic Peri-wound: Calloused Exudate: None: wound tissue dry  No   Orthopedic: No pain to palpation either foot. R Hallux, 2nd, 3rd toe amputation noted.   Assessment & Plan:  Patient was evaluated and treated and all questions answered.  Ulcer R Forefoot -Debridement as below. -Dressed with medihoney, DSD. -X-rays taken and reviewed.  No osseous erosions.  No apparent change from priors -Keep follow-up with Dr. Amalia Hailey  Procedure: Excisional Debridement of Wound Rationale: Removal of non-viable soft tissue from the wound to promote healing.  Anesthesia: none Pre-Debridement Wound Measurements: 0.5 x 0.3 Post-Debridement Wound Measurements: 6 cm x 4 cm x 0.2 Type of Debridement: Excisional Tissue Removed: Non-viable soft tissue Depth of Debridement: subq Instrumentation: 3-0 mm dermal curette Technique: Sharp excisional debridement to bleeding, viable wound base.  Dressing: Dry, sterile, compression dressing. Disposition: Patient tolerated procedure well. Patient to return in 1 week for follow-up.  No Follow-up on file.

## 2017-12-26 ENCOUNTER — Ambulatory Visit (INDEPENDENT_AMBULATORY_CARE_PROVIDER_SITE_OTHER): Payer: Medicare HMO | Admitting: Podiatry

## 2017-12-26 ENCOUNTER — Encounter: Payer: Self-pay | Admitting: Podiatry

## 2017-12-26 DIAGNOSIS — L97512 Non-pressure chronic ulcer of other part of right foot with fat layer exposed: Secondary | ICD-10-CM

## 2017-12-26 DIAGNOSIS — I70235 Atherosclerosis of native arteries of right leg with ulceration of other part of foot: Secondary | ICD-10-CM

## 2017-12-26 DIAGNOSIS — E0842 Diabetes mellitus due to underlying condition with diabetic polyneuropathy: Secondary | ICD-10-CM

## 2017-12-28 NOTE — Progress Notes (Signed)
   Subjective:  77 year old male with PMHx of T2DM presenting today with a chief complaint of an ulceration to the right foot. She reports associated drainage from the wound. She reports seeing Dr. March Rummage on 12/23/17 and had an X-Ray of the foot. At that time, the patient was experiencing significant pain of the wound. Dr. March Rummage debrided and cleansed the ulcer which helped alleviate the pain. Patient is here for further evaluation and treatment.    Past Medical History:  Diagnosis Date  . Anxiety   . Asthma    in past, no inhaler now  . Benign neoplasm of colon   . Depression   . Diverticulosis of colon (without mention of hemorrhage)   . DJD (degenerative joint disease)   . Esophageal reflux   . Glaucoma   . Hidradenitis   . History of BPH   . History of gynecomastia    Unilateral  . Hyperlipidemia   . Irritable bowel syndrome   . Memory loss   . MVP (mitral valve prolapse)   . Obesity, unspecified   . Sleep apnea    does not use  cpap or bipap .. no study  ever  . Type II or unspecified type diabetes mellitus without mention of complication, not stated as uncontrolled   . Unspecified venous (peripheral) insufficiency     Objective/Physical Exam General: The patient is alert and oriented x3 in no acute distress.  Dermatology:  Wound #1 noted to the right foot measuring 1.0 x 0.5 x 0.1 cm (LxWxD).   To the noted ulceration(s), there is no eschar. There is a moderate amount of slough, fibrin, and necrotic tissue noted. Granulation tissue and wound base is red. There is a minimal amount of serosanguineous drainage noted. There is no exposed bone muscle-tendon ligament or joint. There is no malodor. Periwound integrity is intact. Skin is warm, dry and supple bilateral lower extremities.  Vascular: Palpable pedal pulses bilaterally. No edema or erythema noted. Capillary refill within normal limits.  Neurological: Epicritic and protective threshold absent bilaterally.    Musculoskeletal Exam: Range of motion within normal limits to all pedal and ankle joints bilateral. Muscle strength 5/5 in all groups bilateral.   Assessment: #1 ulceration of the right foot secondary to diabetes mellitus #2 diabetes mellitus w/ peripheral neuropathy   Plan of Care:  #1 Patient was evaluated. #2 medically necessary excisional debridement including subcutaneous tissue was performed using a tissue nipper and a chisel blade. Excisional debridement of all the necrotic nonviable tissue down to healthy bleeding viable tissue was performed with post-debridement measurements same as pre-. #3 the wound was cleansed and dry sterile dressing applied. #4 Continue using Betadine and applying dry sterile dressing daily.  #5 Continue wearing DM shoes. #6 Return to clinic in 4 weeks.    Edrick Kins, DPM [facility]  Dr. Edrick Kins, [place]                                        [place], [place] [address]                Office [phone]  Fax [phone]

## 2018-01-23 ENCOUNTER — Ambulatory Visit: Payer: Medicare HMO | Admitting: Podiatry

## 2018-01-23 DIAGNOSIS — I70235 Atherosclerosis of native arteries of right leg with ulceration of other part of foot: Secondary | ICD-10-CM

## 2018-01-23 DIAGNOSIS — E0842 Diabetes mellitus due to underlying condition with diabetic polyneuropathy: Secondary | ICD-10-CM

## 2018-01-23 DIAGNOSIS — L97512 Non-pressure chronic ulcer of other part of right foot with fat layer exposed: Secondary | ICD-10-CM

## 2018-01-24 NOTE — Progress Notes (Signed)
   Subjective:  77 year old male with PMHx of T2DM presenting today for follow up evaluation of an ulceration to the right foot. He reports a callus that has formed over the wound. He reports an increase in driving recently which may have exacerbated the problem. Patient is here for further evaluation and treatment.    Past Medical History:  Diagnosis Date  . Anxiety   . Asthma    in past, no inhaler now  . Benign neoplasm of colon   . Depression   . Diverticulosis of colon (without mention of hemorrhage)   . DJD (degenerative joint disease)   . Esophageal reflux   . Glaucoma   . Hidradenitis   . History of BPH   . History of gynecomastia    Unilateral  . Hyperlipidemia   . Irritable bowel syndrome   . Memory loss   . MVP (mitral valve prolapse)   . Obesity, unspecified   . Sleep apnea    does not use  cpap or bipap .. no study  ever  . Type II or unspecified type diabetes mellitus without mention of complication, not stated as uncontrolled   . Unspecified venous (peripheral) insufficiency     Objective/Physical Exam General: The patient is alert and oriented x3 in no acute distress.  Dermatology:  Wound #1 noted to the right foot measuring 0.5 x 0.5 x 0.2 cm (LxWxD).   To the noted ulceration(s), there is no eschar. There is a moderate amount of slough, fibrin, and necrotic tissue noted. Granulation tissue and wound base is red. There is a minimal amount of serosanguineous drainage noted. There is no exposed bone muscle-tendon ligament or joint. There is no malodor. Periwound integrity is intact. Skin is warm, dry and supple bilateral lower extremities.  Vascular: Palpable pedal pulses bilaterally. No edema or erythema noted. Capillary refill within normal limits.  Neurological: Epicritic and protective threshold absent bilaterally.   Musculoskeletal Exam: Range of motion within normal limits to all pedal and ankle joints bilateral. Muscle strength 5/5 in all groups  bilateral.   Assessment: #1 ulceration of the right foot secondary to diabetes mellitus #2 diabetes mellitus w/ peripheral neuropathy   Plan of Care:  #1 Patient was evaluated. #2 medically necessary excisional debridement including subcutaneous tissue was performed using a tissue nipper and a chisel blade. Excisional debridement of all the necrotic nonviable tissue down to healthy bleeding viable tissue was performed with post-debridement measurements same as pre-. #3 the wound was cleansed and dry sterile dressing applied. #4 Continue using Betadine and applying dry sterile dressing daily.  #5 Continue wearing DM shoes. Patient scheduled to get new DM shoes soon. #6 Offloading met pads dispensed.  #7 Return to clinic in 4 weeks.    Edrick Kins, DPM [facility]  Dr. Edrick Kins, [place]                                        [place], [place] [address]                Office [phone]  Fax [phone]

## 2018-02-07 ENCOUNTER — Ambulatory Visit (INDEPENDENT_AMBULATORY_CARE_PROVIDER_SITE_OTHER): Payer: Medicare HMO | Admitting: Internal Medicine

## 2018-02-07 ENCOUNTER — Other Ambulatory Visit (INDEPENDENT_AMBULATORY_CARE_PROVIDER_SITE_OTHER): Payer: Medicare HMO

## 2018-02-07 ENCOUNTER — Encounter: Payer: Self-pay | Admitting: Internal Medicine

## 2018-02-07 VITALS — BP 130/64 | HR 67 | Temp 97.3°F | Resp 16 | Ht >= 80 in | Wt 309.1 lb

## 2018-02-07 DIAGNOSIS — IMO0002 Reserved for concepts with insufficient information to code with codable children: Secondary | ICD-10-CM

## 2018-02-07 DIAGNOSIS — E118 Type 2 diabetes mellitus with unspecified complications: Secondary | ICD-10-CM

## 2018-02-07 DIAGNOSIS — E1165 Type 2 diabetes mellitus with hyperglycemia: Secondary | ICD-10-CM

## 2018-02-07 DIAGNOSIS — R3129 Other microscopic hematuria: Secondary | ICD-10-CM

## 2018-02-07 DIAGNOSIS — I739 Peripheral vascular disease, unspecified: Secondary | ICD-10-CM

## 2018-02-07 DIAGNOSIS — E785 Hyperlipidemia, unspecified: Secondary | ICD-10-CM

## 2018-02-07 LAB — URINALYSIS, ROUTINE W REFLEX MICROSCOPIC
Bilirubin Urine: NEGATIVE
HGB URINE DIPSTICK: NEGATIVE
Ketones, ur: NEGATIVE
Leukocytes, UA: NEGATIVE
NITRITE: NEGATIVE
PH: 6 (ref 5.0–8.0)
RBC / HPF: NONE SEEN (ref 0–?)
Specific Gravity, Urine: 1.025 (ref 1.000–1.030)
TOTAL PROTEIN, URINE-UPE24: NEGATIVE
Urine Glucose: NEGATIVE
Urobilinogen, UA: 2 — AB (ref 0.0–1.0)

## 2018-02-07 LAB — POCT GLYCOSYLATED HEMOGLOBIN (HGB A1C): Hemoglobin A1C: 8.9

## 2018-02-07 LAB — COMPREHENSIVE METABOLIC PANEL
ALK PHOS: 44 U/L (ref 39–117)
ALT: 14 U/L (ref 0–53)
AST: 12 U/L (ref 0–37)
Albumin: 3.9 g/dL (ref 3.5–5.2)
BILIRUBIN TOTAL: 1.1 mg/dL (ref 0.2–1.2)
BUN: 10 mg/dL (ref 6–23)
CALCIUM: 8.9 mg/dL (ref 8.4–10.5)
CO2: 30 mEq/L (ref 19–32)
Chloride: 102 mEq/L (ref 96–112)
Creatinine, Ser: 0.79 mg/dL (ref 0.40–1.50)
GFR: 122.67 mL/min (ref >60.00)
Glucose, Bld: 208 mg/dL — ABNORMAL HIGH (ref 70–99)
Potassium: 3.9 mEq/L (ref 3.5–5.1)
Sodium: 137 mEq/L (ref 135–145)
TOTAL PROTEIN: 7.2 g/dL (ref 6.0–8.3)

## 2018-02-07 LAB — LIPID PANEL
CHOLESTEROL: 121 mg/dL (ref 0–200)
HDL: 47.5 mg/dL (ref >39.00)
LDL Cholesterol: 60 mg/dL (ref 0–99)
NonHDL: 73.44
TRIGLYCERIDES: 66 mg/dL (ref 0.0–149.0)
Total CHOL/HDL Ratio: 3
VLDL: 13.2 mg/dL (ref 0.0–40.0)

## 2018-02-07 LAB — MICROALBUMIN / CREATININE URINE RATIO
CREATININE, U: 133.4 mg/dL
MICROALB/CREAT RATIO: 3.5 mg/g (ref 0.0–30.0)
Microalb, Ur: 4.7 mg/dL — ABNORMAL HIGH (ref 0.0–1.9)

## 2018-02-07 MED ORDER — ERTUGLIFLOZIN L-PYROGLUTAMICAC 5 MG PO TABS: 1.0000 | ORAL_TABLET | Freq: Every day | ORAL | 1 refills | Status: DC

## 2018-02-07 NOTE — Patient Instructions (Signed)

## 2018-02-07 NOTE — Progress Notes (Signed)
Subjective:  Patient ID: CLIFORD [name], male    DOB: [birth year]  Age: 77 y.o. MRN: [id number]  CC: Diabetes   HPI [name] presents for f/up - He has not been adherent to his diet lately and is concerned that his blood sugars are up.  He is testing at home is consistently above 200.  Outpatient Medications Prior to Visit  Medication Sig Dispense Refill  . aspirin EC 81 MG tablet Take 1 tablet (81 mg total) by mouth daily. 90 tablet 3  . blood glucose meter kit and supplies KIT Use to test blood sugar up to 3 times daily. DX E11.9 1 each 0  . Blood Glucose Monitoring Suppl (TRUE METRIX METER) w/Device KIT Use to check blood sugar DX E11.8 1 kit 0  . glimepiride (AMARYL) 2 MG tablet Take 1 tablet (2 mg total) by mouth daily with breakfast. 90 tablet 1  . metFORMIN (GLUCOPHAGE) 500 MG tablet Take 1 tablet (500 mg total) by mouth 2 (two) times daily with a meal. 180 tablet 1  . simvastatin (ZOCOR) 40 MG tablet Take 1 tablet (40 mg total) by mouth every evening. 90 tablet 1  . TRUE METRIX BLOOD GLUCOSE TEST test strip USE  TO TEST BLOOD SUGAR  UP  TO THREE TIMES DAILY 300 each 3  . TRUEPLUS LANCETS 33G MISC USE  TO TEST BLOOD SUGAR  UP  TO THREE TIMES DAILY 300 each 3  . sulfamethoxazole-trimethoprim (BACTRIM DS,SEPTRA DS) 800-160 MG tablet Take 1 tablet by mouth 2 (two) times daily. (Patient not taking: Reported on 02/07/2018) 20 tablet 0   No facility-administered medications prior to visit.     ROS Review of Systems  Constitutional: Negative.  Negative for chills, diaphoresis, fatigue and unexpected weight change.  HENT: Negative.  Negative for sore throat and trouble swallowing.   Eyes: Negative for visual disturbance.  Respiratory: Negative for cough, chest tightness, shortness of breath and wheezing.   Cardiovascular: Negative.  Negative for chest pain, palpitations and leg swelling.  Gastrointestinal: Negative for abdominal pain, diarrhea, nausea and vomiting.  Endocrine:  Positive for polydipsia and polyphagia. Negative for polyuria.  Genitourinary: Negative.  Negative for decreased urine volume, difficulty urinating and urgency.  Musculoskeletal: Negative.  Negative for arthralgias, myalgias and neck pain.  Skin: Negative.  Negative for color change and pallor.  Neurological: Negative.  Negative for dizziness, weakness, light-headedness and numbness.  Hematological: Negative for adenopathy. Does not bruise/bleed easily.  Psychiatric/Behavioral: Negative.     Objective:  BP 130/64 (BP Location: Left Arm, Patient Position: Sitting, Cuff Size: Large)   Pulse 67   Temp (!) 97.3 F (36.3 C) (Oral)   Resp 16   Ht '6\' 8"'  (2.032 m)   Wt (!) 309 lb 1.9 oz (140.2 kg)   SpO2 95%   BMI 33.96 kg/m   BP Readings from Last 3 Encounters:  02/07/18 130/64  10/10/17 130/60  07/19/17 (!) 124/54    Wt Readings from Last 3 Encounters:  02/07/18 (!) 309 lb 1.9 oz (140.2 kg)  10/10/17 (!) 312 lb 12 oz (141.9 kg)  07/19/17 (!) 308 lb (139.7 kg)    Physical Exam  Constitutional: He is oriented to person, place, and time. No distress.  HENT:  Mouth/Throat: Oropharynx is clear and moist. No oropharyngeal exudate.  Eyes: Left eye exhibits no discharge. No scleral icterus.  Neck: Normal range of motion. Neck supple. No JVD present. No thyromegaly present.  Cardiovascular: Normal rate and regular rhythm. Exam  reveals no gallop and no friction rub.  Murmur (1/6 SEM) heard. Pulmonary/Chest: Breath sounds normal. No respiratory distress. He has no wheezes. He has no rales.  Abdominal: Soft. Bowel sounds are normal. He exhibits no distension and no mass. There is no tenderness. There is no guarding.  Musculoskeletal: Normal range of motion. He exhibits edema (trace edema BLE). He exhibits no tenderness or deformity.  Lymphadenopathy:    He has no cervical adenopathy.  Neurological: He is alert and oriented to person, place, and time.  Skin: Skin is warm and dry. No rash  noted. He is not diaphoretic. No erythema. No pallor.  Vitals reviewed.   Lab Results  Component Value Date   WBC 4.4 01/17/2017   HGB 13.9 01/17/2017   HCT 41.9 01/17/2017   PLT 150.0 01/17/2017   GLUCOSE 208 (H) 02/07/2018   CHOL 121 02/07/2018   TRIG 66.0 02/07/2018   HDL 47.50 02/07/2018   LDLCALC 60 02/07/2018   ALT 14 02/07/2018   AST 12 02/07/2018   NA 137 02/07/2018   K 3.9 02/07/2018   CL 102 02/07/2018   CREATININE 0.79 02/07/2018   BUN 10 02/07/2018   CO2 30 02/07/2018   TSH 1.31 01/17/2017   PSA 0.35 05/17/2017   INR 1.05 10/17/2012   HGBA1C 8.9 02/07/2018   MICROALBUR 4.7 (H) 02/07/2018    Dg Foot Complete Right  Result Date: 07/12/2017 CLINICAL DATA:  Right foot and toe pain. Diabetic infection of the right foot. EXAM: RIGHT FOOT COMPLETE - 3+ VIEW COMPARISON:  05/29/2009 FINDINGS: The patient has had prior amputation of the first, second and third toes. Old healed fractures of the distal third and fourth metatarsals. There are no fractures and there is no dislocation. No bone destruction to suggest osteomyelitis. Arthritic changes of the midfoot with a flatfoot deformity. IMPRESSION: No acute abnormality. Specifically, no radiographic evidence suggestive of osteomyelitis. Electronically Signed   By: Lorriane Shire M.D.   On: 07/12/2017 08:58    Assessment & Plan:   [name] was seen today for diabetes.  Diagnoses and all orders for this visit:  Diabetes mellitus type 2, uncontrolled, with complications ([place])- His A1c is up to 8.9%.  He is not willing to add an insulin product.  He will continue the SU and metformin.  Will also add an SGLT-2 inhibitor to lower his blood sugars. -     Comprehensive metabolic panel; Future -     Urinalysis, Routine w reflex microscopic; Future -     Cancel: Hemoglobin A1c; Future -     Microalbumin / creatinine urine ratio; Future -     Ertugliflozin L-PyroglutamicAc (STEGLATRO) 5 MG TABS; Take 1 tablet by mouth daily. -      POCT glycosylated hemoglobin (Hb A1C) -     Consult to [place] Management -     Amb Referral to Nutrition and Diabetic E  PAD (peripheral artery disease) ([place])- He is not symptomatic with respect to this.  Will continue aggressive risk factor modification. -     Lipid panel; Future  Hyperlipidemia with target LDL less than 70- He has achieved his LDL goal and is doing well on the statin. -     Lipid panel; Future  Other microscopic hematuria- This has resolved.  There is no blood in his urine today. -     Urinalysis, Routine w reflex microscopic; Future   I am having Erling Conte. Langan start on [place] L-PyroglutamicAc. I am also having him maintain his  aspirin EC, blood glucose meter kit and supplies, TRUE METRIX METER, TRUEPLUS LANCETS 33G, TRUE METRIX BLOOD GLUCOSE TEST, glimepiride, simvastatin, metFORMIN, and sulfamethoxazole-trimethoprim.  Meds ordered this encounter  Medications  . Ertugliflozin L-PyroglutamicAc (STEGLATRO) 5 MG TABS    Sig: Take 1 tablet by mouth daily.    Dispense:  90 tablet    Refill:  1     Follow-up: Return in about 4 months (around 06/09/2018).  Scarlette Calico, MD

## 2018-02-09 ENCOUNTER — Other Ambulatory Visit: Payer: Self-pay

## 2018-02-09 NOTE — Patient Outreach (Signed)
[place] [facility]) Care Management  02/09/2018  DERREN SUYDAM 29-Apr-1941 [id number]   Telephone call to patient for MD referral screen.  No answer.  HIPAA compliant voice message left.   Plan: RN CM will send letter and attempt again within 4 business days.  Jone Baseman, RN, MSN [facility] Care Management Care Management Coordinator Direct Line [phone] Toll Free: [phone]  Fax: [phone]

## 2018-02-14 ENCOUNTER — Other Ambulatory Visit: Payer: Self-pay

## 2018-02-14 NOTE — Patient Outreach (Signed)
[place] [facility]) Care Management  02/14/2018  Jason [name] [birth year] [id number]   2nd telephone call to patient for physician referral. Spoke with patient. He is able to verify HIPAA. Patient reports he is doing ok.  Discussed with patient the reason for the call.  Patient reports that his sugars had been up in the 200's prior to his physician appointment and his A1c was up too. He states that Dr. Ronnald Ramp gave him a different medication Steglatro.  He states that his sugars have been better as he had gotten off hs diet due to stresses in his life but he states he is doing much better with his sugars being in the 168-117 range.  Discussed with patient disease management for diabetes. Patient declines that he needs that right now but would like to know how to get in contact if he needs further education and support.  Advised patient that I would send letter and brochure for future reference. He verbalized understanding.  Plan: RN CM will send letter and brochure for future reference.   RN CM will close case.   Jone Baseman, RN, MSN [facility] Care Management Care Management Coordinator Direct Line [phone] Toll Free: [phone]  Fax: [phone]

## 2018-02-17 NOTE — Addendum Note (Signed)
Addended by: Aviva Signs M on: 02/17/2018 02:44 PM   Modules accepted: Orders

## 2018-02-20 ENCOUNTER — Ambulatory Visit (INDEPENDENT_AMBULATORY_CARE_PROVIDER_SITE_OTHER): Payer: Medicare HMO | Admitting: Podiatry

## 2018-02-20 ENCOUNTER — Encounter: Payer: Self-pay | Admitting: Podiatry

## 2018-02-20 DIAGNOSIS — B351 Tinea unguium: Secondary | ICD-10-CM | POA: Diagnosis not present

## 2018-02-20 DIAGNOSIS — E0842 Diabetes mellitus due to underlying condition with diabetic polyneuropathy: Secondary | ICD-10-CM | POA: Diagnosis not present

## 2018-02-20 DIAGNOSIS — M79676 Pain in unspecified toe(s): Secondary | ICD-10-CM | POA: Diagnosis not present

## 2018-02-20 DIAGNOSIS — I70235 Atherosclerosis of native arteries of right leg with ulceration of other part of foot: Secondary | ICD-10-CM

## 2018-02-20 DIAGNOSIS — L97512 Non-pressure chronic ulcer of other part of right foot with fat layer exposed: Secondary | ICD-10-CM | POA: Diagnosis not present

## 2018-02-22 NOTE — Progress Notes (Addendum)
Subjective:  77 year old male with PMHx of T2DM presenting today for follow up evaluation of an ulceration to the right foot. He states there is now a callus formed over the wound. He has been applying betadine to the area for treatment.  He also complains of elongated, thickened nails of the bilateral feet. He is unable to trim his own nails. Patient is here for further evaluation and treatment.   Past Medical History:  Diagnosis Date  . Anxiety   . Asthma    in past, no inhaler now  . Benign neoplasm of colon   . Depression   . Diverticulosis of colon (without mention of hemorrhage)   . DJD (degenerative joint disease)   . Esophageal reflux   . Glaucoma   . Hidradenitis   . History of BPH   . History of gynecomastia    Unilateral  . Hyperlipidemia   . Irritable bowel syndrome   . Memory loss   . MVP (mitral valve prolapse)   . Obesity, unspecified   . Sleep apnea    does not use  cpap or bipap .. no study  ever  . Type II or unspecified type diabetes mellitus without mention of complication, not stated as uncontrolled   . Unspecified venous (peripheral) insufficiency     Objective/Physical Exam General: The patient is alert and oriented x3 in no acute distress.  Dermatology:  Wound #1 noted to the right foot measuring 0.4 x 0.4 x 0.2 cm (LxWxD).   To the noted ulceration(s), there is no eschar. There is a moderate amount of slough, fibrin, and necrotic tissue noted. Granulation tissue and wound base is red. There is a minimal amount of serosanguineous drainage noted. There is no exposed bone muscle-tendon ligament or joint. There is no malodor. Periwound integrity is intact. Nails are tender, long, thickened and dystrophic with subungual debris, consistent with onychomycosis, 1-5 left, 4 and 5 right. No signs of infection.  Skin is warm, dry and supple bilateral lower extremities.  Vascular: Palpable pedal pulses bilaterally. No edema or erythema noted. Capillary refill  within normal limits.  Neurological: Epicritic and protective threshold absent bilaterally.   Musculoskeletal Exam: Range of motion within normal limits to all pedal and ankle joints bilateral. Muscle strength 5/5 in all groups bilateral.   Assessment: #1 ulceration of the right foot secondary to diabetes mellitus #2 Onychomycosis of nail due to dermatophyte bilateral, 1-5 left, 4-5 right #3 h/o toe amputations 1-3 right  #4 diabetes mellitus w/ peripheral neuropathy   Plan of Care:  #1 Patient was evaluated. #2 medically necessary excisional debridement including subcutaneous tissue was performed using a tissue nipper and a chisel blade. Excisional debridement of all the necrotic nonviable tissue down to healthy bleeding viable tissue was performed with post-debridement measurements same as pre-. #3 the wound was cleansed and dry sterile dressing applied. #4 Continue using Betadine and applying dry sterile dressing daily.  #5 Mechanical debridement of nails 1-5 of the left foot and 4-5 of the right foot performed using a nail nipper. Filed with dremel without incident.  #6 Return to clinic in 4 weeks.    Edrick Kins, DPM [facility]  Dr. Edrick Kins, DPM    [address]  [place], [place] [address]                Office [phone]  Fax [phone]

## 2018-03-02 DIAGNOSIS — E119 Type 2 diabetes mellitus without complications: Secondary | ICD-10-CM | POA: Diagnosis not present

## 2018-03-02 DIAGNOSIS — H524 Presbyopia: Secondary | ICD-10-CM | POA: Diagnosis not present

## 2018-03-02 DIAGNOSIS — H52203 Unspecified astigmatism, bilateral: Secondary | ICD-10-CM | POA: Diagnosis not present

## 2018-03-02 DIAGNOSIS — H5213 Myopia, bilateral: Secondary | ICD-10-CM | POA: Diagnosis not present

## 2018-03-02 DIAGNOSIS — Z7984 Long term (current) use of oral hypoglycemic drugs: Secondary | ICD-10-CM | POA: Diagnosis not present

## 2018-03-02 DIAGNOSIS — H2513 Age-related nuclear cataract, bilateral: Secondary | ICD-10-CM | POA: Diagnosis not present

## 2018-03-02 LAB — HM DIABETES EYE EXAM

## 2018-03-09 ENCOUNTER — Encounter: Payer: Self-pay | Admitting: Internal Medicine

## 2018-03-20 ENCOUNTER — Ambulatory Visit: Payer: Medicare HMO | Admitting: Podiatry

## 2018-03-20 ENCOUNTER — Encounter: Payer: Self-pay | Admitting: Podiatry

## 2018-03-20 DIAGNOSIS — I70235 Atherosclerosis of native arteries of right leg with ulceration of other part of foot: Secondary | ICD-10-CM | POA: Diagnosis not present

## 2018-03-20 DIAGNOSIS — E0842 Diabetes mellitus due to underlying condition with diabetic polyneuropathy: Secondary | ICD-10-CM | POA: Diagnosis not present

## 2018-03-20 DIAGNOSIS — L97512 Non-pressure chronic ulcer of other part of right foot with fat layer exposed: Secondary | ICD-10-CM

## 2018-03-20 MED ORDER — SULFAMETHOXAZOLE-TRIMETHOPRIM 800-160 MG PO TABS: 1.0000 | ORAL_TABLET | Freq: Two times a day (BID) | ORAL | 0 refills | Status: DC

## 2018-03-21 ENCOUNTER — Ambulatory Visit (INDEPENDENT_AMBULATORY_CARE_PROVIDER_SITE_OTHER): Payer: Medicare HMO | Admitting: Family

## 2018-03-21 ENCOUNTER — Encounter: Payer: Self-pay | Admitting: Family

## 2018-03-21 ENCOUNTER — Other Ambulatory Visit (INDEPENDENT_AMBULATORY_CARE_PROVIDER_SITE_OTHER): Payer: Medicare HMO

## 2018-03-21 VITALS — BP 110/60 | HR 69 | Temp 97.7°F | Ht >= 80 in | Wt 293.0 lb

## 2018-03-21 DIAGNOSIS — R300 Dysuria: Secondary | ICD-10-CM

## 2018-03-21 DIAGNOSIS — R319 Hematuria, unspecified: Secondary | ICD-10-CM

## 2018-03-21 LAB — CBC WITH DIFFERENTIAL/PLATELET
BASOS PCT: 0.8 % (ref 0.0–3.0)
Basophils Absolute: 0 10*3/uL (ref 0.0–0.1)
EOS ABS: 0.3 10*3/uL (ref 0.0–0.7)
EOS PCT: 6.8 % — AB (ref 0.0–5.0)
HCT: 45.2 % (ref 39.0–52.0)
Hemoglobin: 15.3 g/dL (ref 13.0–17.0)
LYMPHS ABS: 0.8 10*3/uL (ref 0.7–4.0)
Lymphocytes Relative: 16.7 % (ref 12.0–46.0)
MCHC: 33.9 g/dL (ref 30.0–36.0)
MCV: 95.8 fl (ref 78.0–100.0)
Monocytes Absolute: 0.4 10*3/uL (ref 0.1–1.0)
Monocytes Relative: 7.5 % (ref 3.0–12.0)
NEUTROS PCT: 68.2 % (ref 43.0–77.0)
Neutro Abs: 3.4 10*3/uL (ref 1.4–7.7)
PLATELETS: 174 10*3/uL (ref 150.0–400.0)
RBC: 4.71 Mil/uL (ref 4.22–5.81)
RDW: 13.1 % (ref 11.5–15.5)
WBC: 5 10*3/uL (ref 4.0–10.5)

## 2018-03-21 LAB — POC URINALSYSI DIPSTICK (AUTOMATED)
Bilirubin, UA: NEGATIVE
Blood, UA: NEGATIVE
Glucose, UA: 2
Ketones, UA: NEGATIVE
LEUKOCYTES UA: NEGATIVE
NITRITE UA: NEGATIVE
PROTEIN UA: NEGATIVE
SPEC GRAV UA: 1.025 (ref 1.010–1.025)
UROBILINOGEN UA: 0.2 U/dL
pH, UA: 6 (ref 5.0–8.0)

## 2018-03-21 LAB — COMPREHENSIVE METABOLIC PANEL
ALT: 16 U/L (ref 0–53)
AST: 14 U/L (ref 0–37)
Albumin: 4.4 g/dL (ref 3.5–5.2)
Alkaline Phosphatase: 42 U/L (ref 39–117)
BUN: 10 mg/dL (ref 6–23)
CHLORIDE: 103 meq/L (ref 96–112)
CO2: 28 mEq/L (ref 19–32)
CREATININE: 1.01 mg/dL (ref 0.40–1.50)
Calcium: 9.4 mg/dL (ref 8.4–10.5)
GFR: 92.36 mL/min (ref >60.00)
GLUCOSE: 136 mg/dL — AB (ref 70–99)
POTASSIUM: 4.4 meq/L (ref 3.5–5.1)
SODIUM: 141 meq/L (ref 135–145)
TOTAL PROTEIN: 7.7 g/dL (ref 6.0–8.3)
Total Bilirubin: 0.8 mg/dL (ref 0.2–1.2)

## 2018-03-21 LAB — PSA: PSA: 0.63 ng/mL (ref 0.10–4.00)

## 2018-03-21 NOTE — Progress Notes (Signed)
Jason [name] is a 77 y.o. male with the following history as recorded in EpicCare:  Patient Active Problem List   Diagnosis Date Noted  . Diabetic infection of right foot ([place]) 07/12/2017  . Foot ulcer, right, with unspecified severity ([place]) 01/17/2017  . Hematuria 01/17/2017  . Complex sleep apnea syndrome 01/12/2016  . Ex-smoker 01/01/2016  . Diabetes mellitus type 2, uncontrolled, with complications ([place]) 72/53/6644  . OSA (obstructive sleep apnea) 12/18/2015  . Benign prostatic hyperplasia 02/18/2014  . Routine general medical examination at a health care facility 02/18/2014  . PAD (peripheral artery disease) ([place]) 02/18/2014  . DEGENERATIVE JOINT DISEASE 02/20/2008  . Diabetes mellitus type 2 with atherosclerosis of arteries of extremities ([place]) 01/26/2008  . Hyperlipidemia with target LDL less than 70 01/26/2008  . Obesity 01/26/2008  . Mitral valve disorder 01/26/2008  . GERD 01/26/2008  . Irritable bowel syndrome 01/26/2008    Current Outpatient Medications  Medication Sig Dispense Refill  . aspirin EC 81 MG tablet Take 1 tablet (81 mg total) by mouth daily. 90 tablet 3  . blood glucose meter kit and supplies KIT Use to test blood sugar up to 3 times daily. DX E11.9 1 each 0  . Blood Glucose Monitoring Suppl (TRUE METRIX METER) w/Device KIT Use to check blood sugar DX E11.8 1 kit 0  . Ertugliflozin L-PyroglutamicAc (STEGLATRO) 5 MG TABS Take 1 tablet by mouth daily. 90 tablet 1  . Ertugliflozin L-PyroglutamicAc 5 MG TABS Take by mouth.    Mar[place] Kitchen glimepiride (AMARYL) 2 MG tablet Take 1 tablet (2 mg total) by mouth daily with breakfast. 90 tablet 1  . metFORMIN (GLUCOPHAGE) 500 MG tablet Take 1 tablet (500 mg total) by mouth 2 (two) times daily with a meal. 180 tablet 1  . simvastatin (ZOCOR) 40 MG tablet Take 1 tablet (40 mg total) by mouth every evening. 90 tablet 1  . sulfamethoxazole-trimethoprim (BACTRIM DS,SEPTRA DS) 800-160 MG tablet Take 1 tablet by mouth 2 (two) times  daily. 28 tablet 0  . TRUE METRIX BLOOD GLUCOSE TEST test strip USE  TO TEST BLOOD SUGAR  UP  TO THREE TIMES DAILY 300 each 3  . TRUEPLUS LANCETS 33G MISC USE  TO TEST BLOOD SUGAR  UP  TO THREE TIMES DAILY 300 each 3   No current facility-administered medications for this visit.     Allergies: Patient has no known allergies.  Past Medical History:  Diagnosis Date  . Anxiety   . Asthma    in past, no inhaler now  . Benign neoplasm of colon   . Depression   . Diverticulosis of colon (without mention of hemorrhage)   . DJD (degenerative joint disease)   . Esophageal reflux   . Glaucoma   . Hidradenitis   . History of BPH   . History of gynecomastia    Unilateral  . Hyperlipidemia   . Irritable bowel syndrome   . Memory loss   . MVP (mitral valve prolapse)   . Obesity, unspecified   . Sleep apnea    does not use  cpap or bipap .. no study  ever  . Type II or unspecified type diabetes mellitus without mention of complication, not stated as uncontrolled   . Unspecified venous (peripheral) insufficiency     Past Surgical History:  Procedure Laterality Date  . AMPUTATION  08/09/2012   Procedure: AMPUTATION DIGIT;  Surgeon: Newt Minion, MD;  Location: [facility];  Service: Orthopedics;  Laterality: Right;  Amputation right  Great Toe MTP Joint  . AMPUTATION  10/17/2012   Procedure: AMPUTATION DIGIT;  Surgeon: Newt Minion, MD;  Location: [facility];  Service: Orthopedics;  Laterality: Right;  Right Foot 3rd Toe Amputation at Metatarsophalangeal Joint  . CIRCUMCISION  06/2100   For Phimosis - Dr Hartley Barefoot  . COLON SURGERY    . KNEE ARTHROSCOPY    . LAPAROTOMY  07/20/11  . LASER ABLATION  06/2009   Left Greater Saphenous vein -Dr Donnetta Hutching  . TOE AMPUTATION  06/2009   x3Right foot second toe -Dr Beola Cord  . TONSILLECTOMY      Family History  Problem Relation Age of Onset  . Diabetes Father   . Cancer Father        Prostate  . Stroke Father   . Prostate cancer Father   . Colon cancer Neg Hx    . Esophageal cancer Neg Hx   . Rectal cancer Neg Hx   . Stomach cancer Neg Hx     Social History   Tobacco Use  . Smoking status: Former Smoker    Packs/day: 0.50    Years: 5.00    Pack years: 2.50    Types: Cigarettes    Last attempt to quit: 09/05/2015    Years since quitting: 2.5  . Smokeless tobacco: Never Used  . Tobacco comment: He quit in 1973.  Smoked for one year between 2015 and 2015  Substance Use Topics  . Alcohol use: Yes    Alcohol/week: 4.2 oz    Types: 7 Cans of beer per week    Comment: not every week     Subjective:  Patient notes that on Saturday he passed "a large" blood clot while urinating; denies any pain with urination; no fever or back pain; no sense of urgency; has not seen further episode of blood; denies any prior history of kidney stones; no back pain or pelvic pain;    Objective:  Vitals:   03/21/18 0917  BP: 110/60  Pulse: 69  Temp: 97.7 F (36.5 C)  TempSrc: Oral  SpO2: 97%  Weight: 293 lb 0.6 oz (132.9 kg)  Height: '6\' 8"'  (2.032 m)    General: Well developed, well nourished, in no acute distress  Skin : Warm and dry.  Head: Normocephalic and atraumatic  Eyes: Sclera and conjunctiva clear; pupils round and reactive to light; extraocular movements intact  Ears: External normal; canals clear; tympanic membranes normal  Oropharynx: Pink, supple. No suspicious lesions  Neck: Supple without thyromegaly, adenopathy  Lungs: Respirations unlabored; clear to auscultation bilaterally without wheeze, rales, rhonchi  CVS exam: normal rate and regular rhythm.  Abdomen: Soft; nontender; nondistended; normoactive bowel sounds; no masses or hepatosplenomegaly  Musculoskeletal: No deformities; no active joint inflammation  Extremities: No edema, cyanosis, clubbing  Vessels: Symmetric bilaterally  Neurologic: Alert and oriented; speech intact; face symmetrical; moves all extremities well; CNII-XII intact without focal deficit  Assessment:  1. Dysuria    2. Hematuria, unspecified type     Plan:  ? If patient passed a small kidney stone; Check U/A today; check CBC, CMP, PSA and renal stone CT; follow-up to be determined;   No follow-ups on file.  Orders Placed This Encounter  Procedures  . CT RENAL STONE STUDY    Standing Status:   Future    Standing Expiration Date:   06/22/2019    Order Specific Question:   Preferred imaging location?    Answer:   [facility]  Order Specific Question:   Radiology Contrast Protocol - do NOT remove file path    Answer:   [email or web address]  . CBC w/Diff    Standing Status:   Future    Number of Occurrences:   1    Standing Expiration Date:   03/21/2019  . Comp Met (CMET)    Standing Status:   Future    Number of Occurrences:   1    Standing Expiration Date:   03/21/2019  . PSA    Standing Status:   Future    Number of Occurrences:   1    Standing Expiration Date:   03/21/2019  . POCT Urinalysis Dipstick (Automated)    Requested Prescriptions    No prescriptions requested or ordered in this encounter

## 2018-03-22 NOTE — Progress Notes (Addendum)
   Subjective:  77 year old male with PMHx of T2DM presenting today for follow up evaluation of an ulceration to the right foot. He reports some drainage from the wound with associated malodor. There are no modifying factors noted. Patient is here for further evaluation and treatment.   Past Medical History:  Diagnosis Date  . Anxiety   . Asthma    in past, no inhaler now  . Benign neoplasm of colon   . Depression   . Diverticulosis of colon (without mention of hemorrhage)   . DJD (degenerative joint disease)   . Esophageal reflux   . Glaucoma   . Hidradenitis   . History of BPH   . History of gynecomastia    Unilateral  . Hyperlipidemia   . Irritable bowel syndrome   . Memory loss   . MVP (mitral valve prolapse)   . Obesity, unspecified   . Sleep apnea    does not use  cpap or bipap .. no study  ever  . Type II or unspecified type diabetes mellitus without mention of complication, not stated as uncontrolled   . Unspecified venous (peripheral) insufficiency     Objective/Physical Exam General: The patient is alert and oriented x3 in no acute distress.  Dermatology:  Wound #1 noted to the right foot measuring 1.5 x 1.5 x 0.7 cm (LxWxD).   To the noted ulceration(s), there is no eschar. There is a moderate amount of slough, fibrin, and necrotic tissue noted. Granulation tissue and wound base is red. There is a minimal amount of serosanguineous drainage noted. There is no exposed bone muscle-tendon ligament or joint. There is malodor noted. Periwound integrity is intact. Skin is warm, dry and supple bilateral lower extremities.  Vascular: Palpable pedal pulses bilaterally. No edema or erythema noted. Capillary refill within normal limits.  Neurological: Epicritic and protective threshold absent bilaterally.   Musculoskeletal Exam: Range of motion within normal limits to all pedal and ankle joints bilateral. Muscle strength 5/5 in all groups bilateral.   Assessment: #1  ulceration of the right foot secondary to diabetes mellitus #2 h/o toe amputations 1-3 right  #3 diabetes mellitus w/ peripheral neuropathy   Plan of Care:  #1 Patient was evaluated. #2 Medically necessary excisional debridement including muscle and deep fascial tissue was performed using a tissue nipper and a chisel blade. Excisional debridement of all the necrotic nonviable tissue down to healthy bleeding viable tissue was performed with post-debridement measurements same as pre-. #3 The wound was cleansed and dry sterile dressing applied.  #4 Recommended Betadine daily with a dry sterile dressing.  #5 Culture taken from wound.  #6 Prescription for Bactrim DS provided to patient.  #7 Post op shoe dispensed. Weightbearing as tolerated.  #8 Return to clinic in 2 weeks.     Edrick Kins, DPM [facility]  Dr. Edrick Kins, [address]                                        [place], [place] [address]                Office [phone]  Fax [phone]

## 2018-03-23 LAB — WOUND CULTURE
MICRO NUMBER:: 90549599
SPECIMEN QUALITY:: ADEQUATE

## 2018-03-31 ENCOUNTER — Ambulatory Visit (INDEPENDENT_AMBULATORY_CARE_PROVIDER_SITE_OTHER)
Admission: RE | Admit: 2018-03-31 | Discharge: 2018-03-31 | Disposition: A | Discharge status: Home / Self Care | Payer: Medicare HMO | Source: Ambulatory Visit | Attending: Family | Admitting: Family

## 2018-03-31 DIAGNOSIS — N200 Calculus of kidney: Secondary | ICD-10-CM | POA: Diagnosis not present

## 2018-03-31 DIAGNOSIS — R319 Hematuria, unspecified: Secondary | ICD-10-CM | POA: Diagnosis not present

## 2018-04-03 ENCOUNTER — Ambulatory Visit: Payer: Medicare HMO | Admitting: Podiatry

## 2018-04-03 DIAGNOSIS — L97512 Non-pressure chronic ulcer of other part of right foot with fat layer exposed: Secondary | ICD-10-CM

## 2018-04-03 DIAGNOSIS — E0842 Diabetes mellitus due to underlying condition with diabetic polyneuropathy: Secondary | ICD-10-CM

## 2018-04-03 DIAGNOSIS — I70235 Atherosclerosis of native arteries of right leg with ulceration of other part of foot: Secondary | ICD-10-CM

## 2018-04-04 ENCOUNTER — Other Ambulatory Visit: Payer: Self-pay | Admitting: Internal Medicine

## 2018-04-04 ENCOUNTER — Other Ambulatory Visit: Payer: Self-pay | Admitting: Family

## 2018-04-04 DIAGNOSIS — K8020 Calculus of gallbladder without cholecystitis without obstruction: Secondary | ICD-10-CM

## 2018-04-04 DIAGNOSIS — R319 Hematuria, unspecified: Secondary | ICD-10-CM

## 2018-04-05 NOTE — Progress Notes (Addendum)
   Subjective:  77 year old male with PMHx of T2DM presenting today for follow up evaluation of an ulceration to the right foot. He states the wound looks unchanged. He denies any new complaints. There are no modifying factors noted. Patient is here for further evaluation and treatment.   Past Medical History:  Diagnosis Date  . Anxiety   . Asthma    in past, no inhaler now  . Benign neoplasm of colon   . Depression   . Diverticulosis of colon (without mention of hemorrhage)   . DJD (degenerative joint disease)   . Esophageal reflux   . Glaucoma   . Hidradenitis   . History of BPH   . History of gynecomastia    Unilateral  . Hyperlipidemia   . Irritable bowel syndrome   . Memory loss   . MVP (mitral valve prolapse)   . Obesity, unspecified   . Sleep apnea    does not use  cpap or bipap .. no study  ever  . Type II or unspecified type diabetes mellitus without mention of complication, not stated as uncontrolled   . Unspecified venous (peripheral) insufficiency     Objective/Physical Exam General: The patient is alert and oriented x3 in no acute distress.  Dermatology:  Wound #1 noted to the right foot measuring 0.8 x 0.6 x 0.2 cm (LxWxD).   To the noted ulceration(s), there is no eschar. There is a moderate amount of slough, fibrin, and necrotic tissue noted. Granulation tissue and wound base is red. There is a minimal amount of serosanguineous drainage noted. There is no exposed bone muscle-tendon ligament or joint. There is malodor noted. Periwound integrity is intact. Skin is warm, dry and supple bilateral lower extremities.  Vascular: Palpable pedal pulses bilaterally. No edema or erythema noted. Capillary refill within normal limits.  Neurological: Epicritic and protective threshold absent bilaterally.   Musculoskeletal Exam: Range of motion within normal limits to all pedal and ankle joints bilateral. Muscle strength 5/5 in all groups bilateral.   Assessment: #1  ulceration of the right foot secondary to diabetes mellitus #2 h/o toe amputations 1-3 right  #3 diabetes mellitus w/ peripheral neuropathy   Plan of Care:  #1 Patient was evaluated. #2 Medically necessary excisional debridement including subcutaneous tissue was performed using a tissue nipper and a chisel blade. Excisional debridement of all the necrotic nonviable tissue down to healthy bleeding viable tissue was performed with post-debridement measurements same as pre-. #3 The wound was cleansed and dry sterile dressing applied. #4 Continue using Betadine daily with dry sterile dressing.  #5 Continue using post op shoe.  #6 Return to clinic in 3 weeks.    Edrick Kins, DPM [facility]  Dr. Edrick Kins, [place]                                        [place], [place] [address]                Office [phone]  Fax [phone]

## 2018-04-06 ENCOUNTER — Other Ambulatory Visit: Payer: Self-pay | Admitting: Internal Medicine

## 2018-04-06 DIAGNOSIS — E118 Type 2 diabetes mellitus with unspecified complications: Secondary | ICD-10-CM

## 2018-04-06 DIAGNOSIS — IMO0002 Reserved for concepts with insufficient information to code with codable children: Secondary | ICD-10-CM

## 2018-04-06 DIAGNOSIS — I70209 Unspecified atherosclerosis of native arteries of extremities, unspecified extremity: Principal | ICD-10-CM

## 2018-04-06 DIAGNOSIS — E1165 Type 2 diabetes mellitus with hyperglycemia: Secondary | ICD-10-CM

## 2018-04-06 DIAGNOSIS — E1151 Type 2 diabetes mellitus with diabetic peripheral angiopathy without gangrene: Secondary | ICD-10-CM

## 2018-04-07 ENCOUNTER — Encounter: Payer: Self-pay | Admitting: Gastroenterology

## 2018-04-13 ENCOUNTER — Ambulatory Visit (INDEPENDENT_AMBULATORY_CARE_PROVIDER_SITE_OTHER): Payer: Medicare HMO | Admitting: *Deleted

## 2018-04-13 VITALS — BP 116/62 | HR 67 | Resp 18 | Ht >= 80 in | Wt 297.0 lb

## 2018-04-13 DIAGNOSIS — E118 Type 2 diabetes mellitus with unspecified complications: Secondary | ICD-10-CM

## 2018-04-13 DIAGNOSIS — Z0000 Encounter for general adult medical examination without abnormal findings: Secondary | ICD-10-CM | POA: Diagnosis not present

## 2018-04-13 DIAGNOSIS — IMO0002 Reserved for concepts with insufficient information to code with codable children: Secondary | ICD-10-CM

## 2018-04-13 DIAGNOSIS — E1165 Type 2 diabetes mellitus with hyperglycemia: Secondary | ICD-10-CM

## 2018-04-13 MED ORDER — ERTUGLIFLOZIN L-PYROGLUTAMICAC 5 MG PO TABS: 1.0000 | ORAL_TABLET | Freq: Every day | ORAL | 1 refills | Status: DC

## 2018-04-13 NOTE — Patient Instructions (Addendum)
[email or web address] or down load app on smart phone  Aunt Berenice Primas website lists multiple social resources for individuals such as: food, health, money, house hold goods, transit, medical supplies, job training and legal services.  Continue doing brain stimulating activities (puzzles, reading, adult coloring books, staying active) to keep memory sharp.   Continue to eat heart healthy diet (full of fruits, vegetables, whole grains, lean protein, water--limit salt, fat, and sugar intake) and increase physical activity as tolerated.   [name] , Thank you for taking time to come for your Medicare Wellness Visit. I appreciate your ongoing commitment to your health goals. Please review the following plan we discussed and let me know if I can assist you in the future.   These are the goals we discussed: Goals    . Patient Stated     Maintain current health status, continue to worship God, enjoy family and life. Spend time with my grandchildren and travel to [place] to visit family and friends.       This is a list of the screening recommended for you and due dates:  Health Maintenance  Topic Date Due  . Colon Cancer Screening  06/09/2018  . Flu Shot  06/15/2018  . Hemoglobin A1C  08/10/2018  . Tetanus Vaccine  02/04/2019  . Complete foot exam   02/08/2019  . Urine Protein Check  02/08/2019  . Eye exam for diabetics  03/03/2019  . Pneumonia vaccines  Completed   It is important to avoid accidents which may result in broken bones.  Here are a few ideas on how to make your home safer so you will be less likely to trip or fall.  1. Use nonskid mats or non slip strips in your shower or tub, on your bathroom floor and around sinks.  If you know that you have spilled water, wipe it up! 2. In the bathroom, it is important to have properly installed grab bars on the walls or on the edge of the tub.  Towel racks are NOT strong enough for you to hold onto or to pull on for support. 3. Stairs and  hallways should have enough light.  Add lamps or night lights if you need ore light. 4. It is good to have handrails on both sides of the stairs if possible.  Always fix broken handrails right away. 5. It is important to see the edges of steps.  Paint the edges of outdoor steps white so you can see them better.  Put colored tape on the edge of inside steps. 6. Throw-rugs are dangerous because they can slide.  Removing the rugs is the best idea, but if they must stay, add adhesive carpet tape to prevent slipping. 7. Do not keep things on stairs or in the halls.  Remove small furniture that blocks the halls as it may cause you to trip.  Keep telephone and electrical cords out of the way where you walk. 8. Always were sturdy, rubber-soled shoes for good support.  Never wear just socks, especially on the stairs.  Socks may cause you to slip or fall.  Do not wear full-length housecoats as you can easily trip on the bottom.  9. Place the things you use the most on the shelves that are the easiest to reach.  If you use a stepstool, make sure it is in good condition.  If you feel unsteady, DO NOT climb, ask for help. 10. If a health professional advises you to use a cane or walker, do  not be ashamed.  These items can keep you from falling and breaking your bones.  Health Maintenance, Male A healthy lifestyle and preventive care is important for your health and wellness. Ask your health care provider about what schedule of regular examinations is right for you. What should I know about weight and diet? Eat a Healthy Diet Eat plenty of vegetables, fruits, whole grains, low-fat dairy products, and lean protein. Do not eat a lot of foods high in solid fats, added sugars, or salt.  Maintain a Healthy Weight Regular exercise can help you achieve or maintain a healthy weight. You should: Do at least 150 minutes of exercise each week. The exercise should increase your heart rate and make you sweat  (moderate-intensity exercise). Do strength-training exercises at least twice a week.  Watch Your Levels of Cholesterol and Blood Lipids Have your blood tested for lipids and cholesterol every 5 years starting at 76 years of age. If you are at high risk for heart disease, you should start having your blood tested when you are 76 years old. You may need to have your cholesterol levels checked more often if: Your lipid or cholesterol levels are high. You are older than 76 years of age. You are at high risk for heart disease.  What should I know about cancer screening? Many types of cancers can be detected early and may often be prevented. Lung Cancer You should be screened every year for lung cancer if: You are a current smoker who has smoked for at least 30 years. You are a former smoker who has quit within the past 15 years. Talk to your health care provider about your screening options, when you should start screening, and how often you should be screened.  Colorectal Cancer Routine colorectal cancer screening usually begins at 76 years of age and should be repeated every 5-10 years until you are 76 years old. You may need to be screened more often if early forms of precancerous polyps or small growths are found. Your health care provider may recommend screening at an earlier age if you have risk factors for colon cancer. Your health care provider may recommend using home test kits to check for hidden blood in the stool. A small camera at the end of a tube can be used to examine your colon (sigmoidoscopy or colonoscopy). This checks for the earliest forms of colorectal cancer.  Prostate and Testicular Cancer Depending on your age and overall health, your health care provider may do certain tests to screen for prostate and testicular cancer. Talk to your health care provider about any symptoms or concerns you have about testicular or prostate cancer.  Skin Cancer Check your skin from head  to toe regularly. Tell your health care provider about any new moles or changes in moles, especially if: There is a change in a mole's size, shape, or color. You have a mole that is larger than a pencil eraser. Always use sunscreen. Apply sunscreen liberally and repeat throughout the day. Protect yourself by wearing long sleeves, pants, a wide-brimmed hat, and sunglasses when outside.  What should I know about heart disease, diabetes, and high blood pressure? If you are 64-69 years of age, have your blood pressure checked every 3-5 years. If you are 86 years of age or older, have your blood pressure checked every year. You should have your blood pressure measured twice-once when you are at a hospital or clinic, and once when you are not at a hospital or  clinic. Record the average of the two measurements. To check your blood pressure when you are not at a hospital or clinic, you can use: An automated blood pressure machine at a pharmacy. A home blood pressure monitor. Talk to your health care provider about your target blood pressure. If you are between 50-42 years old, ask your health care provider if you should take aspirin to prevent heart disease. Have regular diabetes screenings by checking your fasting blood sugar level. If you are at a normal weight and have a low risk for diabetes, have this test once every three years after the age of 56. If you are overweight and have a high risk for diabetes, consider being tested at a younger age or more often. A one-time screening for abdominal aortic aneurysm (AAA) by ultrasound is recommended for men aged 25-75 years who are current or former smokers. What should I know about preventing infection? Hepatitis B If you have a higher risk for hepatitis B, you should be screened for this virus. Talk with your health care provider to find out if you are at risk for hepatitis B infection. Hepatitis C Blood testing is recommended for: Everyone born from  [birth year] through 1964. Anyone with known risk factors for hepatitis C.  Sexually Transmitted Diseases (STDs) You should be screened each year for STDs including gonorrhea and chlamydia if: You are sexually active and are younger than 76 years of age. You are older than 76 years of age and your health care provider tells you that you are at risk for this type of infection. Your sexual activity has changed since you were last screened and you are at an increased risk for chlamydia or gonorrhea. Ask your health care provider if you are at risk. Talk with your health care provider about whether you are at high risk of being infected with HIV. Your health care provider may recommend a prescription medicine to help prevent HIV infection.  What else can I do? Schedule regular health, dental, and eye exams. Stay current with your vaccines (immunizations). Do not use any tobacco products, such as cigarettes, chewing tobacco, and e-cigarettes. If you need help quitting, ask your health care provider. Limit alcohol intake to no more than 2 drinks per day. One drink equals 12 ounces of beer, 5 ounces of Dura Mccormack, or 1 ounces of hard liquor. Do not use street drugs. Do not share needles. Ask your health care provider for help if you need support or information about quitting drugs. Tell your health care provider if you often feel depressed. Tell your health care provider if you have ever been abused or do not feel safe at home. This information is not intended to replace advice given to you by your health care provider. Make sure you discuss any questions you have with your health care provider. Document Released: 04/29/2008 Document Revised: 06/30/2016 Document Reviewed: 08/05/2015 Elsevier Interactive Patient Education  [employer]. 11.

## 2018-04-13 NOTE — Progress Notes (Addendum)
Subjective:   Jason [name] is a 77 y.o. male who presents for Medicare Annual/Subsequent preventive examination.  Review of Systems:  No ROS.  Medicare Wellness Visit. Additional risk factors are reflected in the social history.  Cardiac Risk Factors include: diabetes mellitus;dyslipidemia;male gender;advanced age (>69mn, >>44women) Sleep patterns: does not get up to void, gets up 1-2 times nightly to void and sleeps 7 hours nightly.    Home Safety/Smoke Alarms: Feels safe in home. Smoke alarms in place.  Living environment; residence and Firearm Safety: 1-story house/ trailer, no firearms Lives with wife, no needs for DME, good support system. Seat Belt Safety/Bike Helmet: Wears seat belt.   PSA-  Lab Results  Component Value Date   PSA 0.63 03/21/2018   PSA 0.35 05/17/2017   PSA 0.44 09/24/2015       Objective:    Vitals: BP 116/62   Pulse 67   Resp 18   Ht 6' 8" (2.032 m)   Wt 297 lb (134.7 kg)   SpO2 97%   BMI 32.63 kg/m   Body mass index is 32.63 kg/m.  Advanced Directives 04/13/2018 04/07/2017 01/09/2016 12/19/2015 11/21/2015 11/16/2015 11/16/2015  Does Patient Have a Medical Advance Directive? No No Yes No No No No  Type of Advance Directive - - Living will - - - -  Does patient want to make changes to medical advance directive? - - No - Patient declined - - - -  Copy of [place] Chart? - - No - copy requested - - - -  Would patient like information on creating a medical advance directive? Yes (ED - Information included in AVS) Yes (ED - Information included in AVS) - No - patient declined information Yes - Educational materials given No - patient declined information No - patient declined information  Pre-existing out of facility DNR order (yellow form or pink MOST form) - - - - - - -    Tobacco Social History   Tobacco Use  Smoking Status Former Smoker  . Packs/day: 0.50  . Years: 5.00  . Pack years: 2.50  . Types: Cigarettes  . Last  attempt to quit: 09/05/2015  . Years since quitting: 2.6  Smokeless Tobacco Never Used  Tobacco Comment   He quit in 1973.  Smoked for one year between 2015 and 2015     Counseling given: Not Answered Comment: He quit in 1973.  Smoked for one year between 2015 and 2015  Past Medical History:  Diagnosis Date  . Anxiety   . Asthma    in past, no inhaler now  . Benign neoplasm of colon   . Depression   . Diverticulosis of colon (without mention of hemorrhage)   . DJD (degenerative joint disease)   . Esophageal reflux   . Glaucoma   . Hidradenitis   . History of BPH   . History of gynecomastia    Unilateral  . Hyperlipidemia   . Irritable bowel syndrome   . Memory loss   . MVP (mitral valve prolapse)   . Obesity, unspecified   . Sleep apnea    does not use  cpap or bipap .. no study  ever  . Type II or unspecified type diabetes mellitus without mention of complication, not stated as uncontrolled   . Unspecified venous (peripheral) insufficiency    Past Surgical History:  Procedure Laterality Date  . AMPUTATION  08/09/2012   Procedure: AMPUTATION DIGIT;  Surgeon: MNewt Minion MD;  Location: [place];  Service: Orthopedics;  Laterality: Right;  Amputation right Great Toe MTP Joint  . AMPUTATION  10/17/2012   Procedure: AMPUTATION DIGIT;  Surgeon: Newt Minion, MD;  Location: [place];  Service: Orthopedics;  Laterality: Right;  Right Foot 3rd Toe Amputation at Metatarsophalangeal Joint  . CIRCUMCISION  06/2100   For Phimosis - Dr Hartley Barefoot  . COLON SURGERY    . KNEE ARTHROSCOPY    . LAPAROTOMY  07/20/11  . LASER ABLATION  06/2009   Left Greater Saphenous vein -Dr Donnetta Hutching  . TOE AMPUTATION  06/2009   x3Right foot second toe -Dr Beola Cord  . TONSILLECTOMY     Family History  Problem Relation Age of Onset  . Diabetes Father   . Cancer Father        Prostate  . Stroke Father   . Prostate cancer Father   . Colon cancer Neg Hx   . Esophageal cancer Neg Hx   . Rectal cancer Neg Hx     . Stomach cancer Neg Hx    Social History   Socioeconomic History  . Marital status: Married    Spouse name: Peter Congo  . Number of children: 2  . Years of education: Not on file  . Highest education level: Not on file  Occupational History  . Not on file  Social Needs  . Financial resource strain: Somewhat hard  . Food insecurity:    Worry: Never true    Inability: Never true  . Transportation needs:    Medical: No    Non-medical: No  Tobacco Use  . Smoking status: Former Smoker    Packs/day: 0.50    Years: 5.00    Pack years: 2.50    Types: Cigarettes    Last attempt to quit: 09/05/2015    Years since quitting: 2.6  . Smokeless tobacco: Never Used  . Tobacco comment: He quit in 1973.  Smoked for one year between 2015 and 2015  Substance and Sexual Activity  . Alcohol use: Yes    Alcohol/week: 4.2 oz    Types: 7 Cans of beer per week    Comment: not every week   . Drug use: No  . Sexual activity: Not Currently  Lifestyle  . Physical activity:    Days per week: 3 days    Minutes per session: 50 min  . Stress: Very much  Relationships  . Social connections:    Talks on phone: More than three times a week    Gets together: More than three times a week    Attends religious service: More than 4 times per year    Active member of club or organization: Yes    Attends meetings of clubs or organizations: More than 4 times per year    Relationship status: Married  Other Topics Concern  . Not on file  Social History Narrative  . Not on file    Outpatient Encounter Medications as of 04/13/2018  Medication Sig  . aspirin EC 81 MG tablet Take 1 tablet (81 mg total) by mouth daily.  . blood glucose meter kit and supplies KIT Use to test blood sugar up to 3 times daily. DX E11.9  . Blood Glucose Monitoring Suppl (TRUE METRIX METER) w/Device KIT Use to check blood sugar DX E11.8  . Ertugliflozin L-PyroglutamicAc (STEGLATRO) 5 MG TABS Take 1 tablet by mouth daily.  Marland Kitchen  glimepiride (AMARYL) 2 MG tablet TAKE 1 TABLET EVERY DAY WITH BREAKFAST  . metFORMIN (GLUCOPHAGE)  500 MG tablet TAKE 1 TABLET TWICE DAILY WITH MEALS  . simvastatin (ZOCOR) 40 MG tablet TAKE 1 TABLET EVERY EVENING  . TRUE METRIX BLOOD GLUCOSE TEST test strip USE  TO TEST BLOOD SUGAR  UP  TO THREE TIMES DAILY  . TRUEPLUS LANCETS 33G MISC USE  TO TEST BLOOD SUGAR  UP  TO THREE TIMES DAILY  . [DISCONTINUED] Ertugliflozin L-PyroglutamicAc (STEGLATRO) 5 MG TABS Take 1 tablet by mouth daily.  . [DISCONTINUED] Ertugliflozin L-PyroglutamicAc 5 MG TABS Take by mouth.  . [DISCONTINUED] sulfamethoxazole-trimethoprim (BACTRIM DS,SEPTRA DS) 800-160 MG tablet Take 1 tablet by mouth 2 (two) times daily. (Patient not taking: Reported on 04/13/2018)   No facility-administered encounter medications on file as of 04/13/2018.     Activities of Daily Living In your present state of health, do you have any difficulty performing the following activities: 04/13/2018  Hearing? N  Vision? N  Difficulty concentrating or making decisions? N  Walking or climbing stairs? N  Dressing or bathing? N  Doing errands, shopping? N  Preparing Food and eating ? N  Using the Toilet? N  In the past six months, have you accidently leaked urine? N  Do you have problems with loss of bowel control? N  Managing your Medications? N  Managing your Finances? N  Housekeeping or managing your Housekeeping? N  Some recent data might be hidden    Patient Care Team: [name], MD as PCP - General (Internal Medicine)   Assessment:   This is a routine wellness examination for [place]. Physical assessment deferred to PCP.   Exercise Activities and Dietary recommendations Current Exercise Habits: The patient does not participate in regular exercise at present, Exercise limited by: None identified  Diet (meal preparation, eat out, water intake, caffeinated beverages, dairy products, fruits and vegetables): in general, a "healthy"  diet     Reviewed heart healthy and diabetic diet, encouraged patient to increase daily water intake. Relevant patient education assigned to patient using Emmi.   Goals    . Patient Stated     Maintain current health status, continue to worship God, enjoy family and life. Spend time with my grandchildren and travel to [place] to visit family and friends.       Fall Risk Fall Risk  04/13/2018 04/07/2017 01/15/2016 11/21/2015 09/25/2015  Falls in the past year? Yes No No No No  Number falls in past yr: 1 - - - -  Injury with Fall? No - - - -  Follow up Falls prevention discussed - - - -    Depression Screen PHQ 2/9 Scores 04/13/2018 02/07/2018 04/07/2017 01/15/2016  PHQ - 2 Score _0 0  PHQ- 9 Score _1 -  Exception Documentation - - - -    Cognitive Function MMSE - Mini Mental State Exam 04/13/2018  Orientation to time 5  Orientation to Place 5  Registration 3  Attention/ Calculation 4  Recall 1  Language- name 2 objects 2  Language- repeat 1  Language- follow 3 step command 3  Language- read & follow direction 1  Write a sentence 1  Copy design 1  Total score 27        Immunization History  Administered Date(s) Administered  . Influenza Split 08/30/2011, 08/22/2012, 09/10/2015  . Influenza Whole 10/24/2007, 08/26/2009  . Influenza, High Dose Seasonal PF 07/12/2017  . Influenza,inj,Quad PF,6+ Mos 08/20/2013, 08/09/2014, 08/23/2016  . Influenza-Unspecified 09/12/2015  . Pneumococcal Conjugate-13 02/18/2014  . Pneumococcal Polysaccharide-23 08/06/2008  .  Td 02/03/2009  . Zoster 08/09/2014   Screening Tests Health Maintenance  Topic Date Due  . COLONOSCOPY  06/09/2018  . INFLUENZA VACCINE  06/15/2018  . HEMOGLOBIN A1C  08/10/2018  . TETANUS/TDAP  02/04/2019  . FOOT EXAM  02/08/2019  . URINE MICROALBUMIN  02/08/2019  . OPHTHALMOLOGY EXAM  03/03/2019  . PNA vac Low Risk Adult  Completed      Plan:    Continue doing brain stimulating activities (puzzles,  reading, adult coloring books, staying active) to keep memory sharp.   Continue to eat heart healthy diet (full of fruits, vegetables, whole grains, lean protein, water--limit salt, fat, and sugar intake) and increase physical activity as tolerated.  I have personally reviewed and noted the following in the patient's chart:   . Medical and social history . Use of alcohol, tobacco or illicit drugs  . Current medications and supplements . Functional ability and status . Nutritional status . Physical activity . Advanced directives . List of other physicians . Vitals . Screenings to include cognitive, depression, and falls . Referrals and appointments  In addition, I have reviewed and discussed with patient certain preventive protocols, quality metrics, and best practice recommendations. A written personalized care plan for preventive services as well as general preventive health recommendations were provided to patient.     Michiel Cowboy, RN  04/13/2018  Medical screening examination/treatment/procedure(s) were performed by non-physician practitioner and as supervising physician I was immediately available for consultation/collaboration. I agree with above. Scarlette Calico, MD

## 2018-04-24 ENCOUNTER — Ambulatory Visit: Payer: Medicare HMO | Admitting: Podiatry

## 2018-04-24 DIAGNOSIS — I70235 Atherosclerosis of native arteries of right leg with ulceration of other part of foot: Secondary | ICD-10-CM

## 2018-04-24 DIAGNOSIS — E0842 Diabetes mellitus due to underlying condition with diabetic polyneuropathy: Secondary | ICD-10-CM

## 2018-04-24 DIAGNOSIS — L97512 Non-pressure chronic ulcer of other part of right foot with fat layer exposed: Secondary | ICD-10-CM | POA: Diagnosis not present

## 2018-04-25 NOTE — Progress Notes (Addendum)
   Subjective:  77 year old male with PMHx of T2DM presenting today for follow up evaluation of an ulceration to the right foot. He states the wound looks better. There are no modifying factors noted. Patient is here for further evaluation and treatment.   Past Medical History:  Diagnosis Date  . Anxiety   . Asthma    in past, no inhaler now  . Benign neoplasm of colon   . Depression   . Diverticulosis of colon (without mention of hemorrhage)   . DJD (degenerative joint disease)   . Esophageal reflux   . Glaucoma   . Hidradenitis   . History of BPH   . History of gynecomastia    Unilateral  . Hyperlipidemia   . Irritable bowel syndrome   . Memory loss   . MVP (mitral valve prolapse)   . Obesity, unspecified   . Sleep apnea    does not use  cpap or bipap .. no study  ever  . Type II or unspecified type diabetes mellitus without mention of complication, not stated as uncontrolled   . Unspecified venous (peripheral) insufficiency     Objective/Physical Exam General: The patient is alert and oriented x3 in no acute distress.  Dermatology:  Wound #1 noted to the right foot measuring 1.0 x 1.0 x 0.3 cm (LxWxD).   To the noted ulceration(s), there is no eschar. There is a moderate amount of slough, fibrin, and necrotic tissue noted. Granulation tissue and wound base is red. There is a minimal amount of serosanguineous drainage noted. There is no exposed bone muscle-tendon ligament or joint. There is malodor noted. Periwound integrity is intact. Skin is warm, dry and supple bilateral lower extremities.  Vascular: Palpable pedal pulses bilaterally. No edema or erythema noted. Capillary refill within normal limits.  Neurological: Epicritic and protective threshold absent bilaterally.   Musculoskeletal Exam: Range of motion within normal limits to all pedal and ankle joints bilateral. Muscle strength 5/5 in all groups bilateral.   Assessment: #1 ulceration of the right foot  secondary to diabetes mellitus #2 h/o toe amputations 1-3 right  #3 diabetes mellitus w/ peripheral neuropathy   Plan of Care:  #1 Patient was evaluated. #2 Medically necessary excisional debridement including subcutaneous tissue was performed using a tissue nipper and a chisel blade. Excisional debridement of all the necrotic nonviable tissue down to healthy bleeding viable tissue was performed with post-debridement measurements same as pre-. #3 The wound was cleansed and dry sterile dressing applied. #4 Continue using Betadine daily with dry sterile dressing.  #5 Continue wearing post op shoe.  #6 Return to clinic in 3 weeks.    Edrick Kins, DPM [facility]  Dr. Edrick Kins, [place]                                        [place], [place] [address]                Office [phone]  Fax [phone]

## 2018-04-29 DIAGNOSIS — E1165 Type 2 diabetes mellitus with hyperglycemia: Secondary | ICD-10-CM | POA: Diagnosis not present

## 2018-05-01 ENCOUNTER — Telehealth: Payer: Self-pay | Admitting: Internal Medicine

## 2018-05-01 NOTE — Telephone Encounter (Signed)
Called and spoke with patient's wife today and info given from [facility]. Patient's wife voiced understanding and will pass info along to patient.

## 2018-05-01 NOTE — Telephone Encounter (Signed)
Copied from [place] [id number]. Topic: Quick Communication - See Telephone Encounter >> May 01, 2018  3:32 PM Bea Graff, NT wrote: CRM for notification. See Telephone encounter for: 05/01/18. Pt states he was called today to set up for gallbladder surgery and he states he is unaware of why. He did see Jodi Mourning for his gallbladder but surgery was not mentioned per patient. Please advise.

## 2018-05-15 ENCOUNTER — Encounter: Payer: Medicare HMO | Admitting: Podiatry

## 2018-05-15 ENCOUNTER — Encounter: Payer: Self-pay | Admitting: Podiatry

## 2018-05-15 DIAGNOSIS — K8020 Calculus of gallbladder without cholecystitis without obstruction: Secondary | ICD-10-CM | POA: Diagnosis not present

## 2018-05-15 NOTE — Progress Notes (Signed)
This encounter was created in error - please disregard.

## 2018-05-24 ENCOUNTER — Ambulatory Visit (INDEPENDENT_AMBULATORY_CARE_PROVIDER_SITE_OTHER): Payer: Medicare HMO | Admitting: Podiatry

## 2018-05-24 DIAGNOSIS — L97512 Non-pressure chronic ulcer of other part of right foot with fat layer exposed: Secondary | ICD-10-CM

## 2018-05-24 DIAGNOSIS — I70235 Atherosclerosis of native arteries of right leg with ulceration of other part of foot: Secondary | ICD-10-CM | POA: Diagnosis not present

## 2018-05-24 DIAGNOSIS — E0842 Diabetes mellitus due to underlying condition with diabetic polyneuropathy: Secondary | ICD-10-CM

## 2018-05-28 NOTE — Progress Notes (Addendum)
   Subjective:  77 year old male with PMHx of T2DM presenting today for follow up evaluation of an ulceration to the right foot. He states he is doing well overall and the wound is healing. There are no modifying factors noted. He has been using betadine daily with a dressing and wearing the post op shoe as directed. Patient is here for further evaluation and treatment.   Past Medical History:  Diagnosis Date  . Anxiety   . Asthma    in past, no inhaler now  . Benign neoplasm of colon   . Depression   . Diverticulosis of colon (without mention of hemorrhage)   . DJD (degenerative joint disease)   . Esophageal reflux   . Glaucoma   . Hidradenitis   . History of BPH   . History of gynecomastia    Unilateral  . Hyperlipidemia   . Irritable bowel syndrome   . Memory loss   . MVP (mitral valve prolapse)   . Obesity, unspecified   . Sleep apnea    does not use  cpap or bipap .. no study  ever  . Type II or unspecified type diabetes mellitus without mention of complication, not stated as uncontrolled   . Unspecified venous (peripheral) insufficiency     Objective/Physical Exam General: The patient is alert and oriented x3 in no acute distress.  Dermatology:  Wound #1 noted to the right foot measuring 1.0 x 1.0 x 0.2 cm (LxWxD).   To the noted ulceration(s), there is no eschar. There is a moderate amount of slough, fibrin, and necrotic tissue noted. Granulation tissue and wound base is red. There is a minimal amount of serosanguineous drainage noted. There is no exposed bone muscle-tendon ligament or joint. There is malodor noted. Periwound integrity is intact. Skin is warm, dry and supple bilateral lower extremities.  Vascular: Palpable pedal pulses bilaterally. No edema or erythema noted. Capillary refill within normal limits.  Neurological: Epicritic and protective threshold absent bilaterally.   Musculoskeletal Exam: Range of motion within normal limits to all pedal and ankle  joints bilateral. Muscle strength 5/5 in all groups bilateral.   Assessment: #1 ulceration of the right foot secondary to diabetes mellitus #2 h/o toe amputations 1-3 right  #3 diabetes mellitus w/ peripheral neuropathy   Plan of Care:  #1 Patient was evaluated. #2 Medically necessary excisional debridement including subcutaneous tissue was performed using a tissue nipper and a chisel blade. Excisional debridement of all the necrotic nonviable tissue down to healthy bleeding viable tissue was performed with post-debridement measurements same as pre-. #3 The wound was cleansed and dry sterile dressing applied. #4 Continue using Betadine daily with dry sterile dressing.  #5 Continue wearing post op shoe.  #6 We will proceed with surgery when patient is ready.  #7 Return to clinic in 4 weeks.    Edrick Kins, DPM [facility]  Dr. Edrick Kins, [place]                                        [place], [place] [address]                Office [phone]  Fax [phone]

## 2018-06-09 ENCOUNTER — Other Ambulatory Visit: Payer: Self-pay

## 2018-06-09 ENCOUNTER — Encounter (HOSPITAL_COMMUNITY): Payer: Self-pay

## 2018-06-09 ENCOUNTER — Emergency Department (HOSPITAL_COMMUNITY): Payer: Medicare HMO

## 2018-06-09 ENCOUNTER — Emergency Department (HOSPITAL_COMMUNITY)
Admission: EM | Admit: 2018-06-09 | Discharge: 2018-06-09 | Disposition: A | Discharge status: Home / Self Care | Payer: Medicare HMO | Attending: Emergency Medicine | Admitting: Emergency Medicine

## 2018-06-09 DIAGNOSIS — Z89429 Acquired absence of other toe(s), unspecified side: Secondary | ICD-10-CM | POA: Insufficient documentation

## 2018-06-09 DIAGNOSIS — K8020 Calculus of gallbladder without cholecystitis without obstruction: Secondary | ICD-10-CM | POA: Diagnosis not present

## 2018-06-09 DIAGNOSIS — E119 Type 2 diabetes mellitus without complications: Secondary | ICD-10-CM | POA: Diagnosis not present

## 2018-06-09 DIAGNOSIS — Z7984 Long term (current) use of oral hypoglycemic drugs: Secondary | ICD-10-CM | POA: Diagnosis not present

## 2018-06-09 DIAGNOSIS — Z87891 Personal history of nicotine dependence: Secondary | ICD-10-CM | POA: Diagnosis not present

## 2018-06-09 DIAGNOSIS — R140 Abdominal distension (gaseous): Secondary | ICD-10-CM | POA: Diagnosis not present

## 2018-06-09 DIAGNOSIS — Z79899 Other long term (current) drug therapy: Secondary | ICD-10-CM | POA: Insufficient documentation

## 2018-06-09 DIAGNOSIS — K5900 Constipation, unspecified: Secondary | ICD-10-CM | POA: Insufficient documentation

## 2018-06-09 DIAGNOSIS — R1011 Right upper quadrant pain: Secondary | ICD-10-CM | POA: Diagnosis not present

## 2018-06-09 DIAGNOSIS — R945 Abnormal results of liver function studies: Secondary | ICD-10-CM | POA: Diagnosis not present

## 2018-06-09 DIAGNOSIS — R7989 Other specified abnormal findings of blood chemistry: Secondary | ICD-10-CM

## 2018-06-09 DIAGNOSIS — R52 Pain, unspecified: Secondary | ICD-10-CM

## 2018-06-09 LAB — COMPREHENSIVE METABOLIC PANEL
ALK PHOS: 92 U/L (ref 38–126)
ALT: 541 U/L — AB (ref 0–44)
ANION GAP: 10 (ref 5–15)
AST: 983 U/L — ABNORMAL HIGH (ref 15–41)
Albumin: 4 g/dL (ref 3.5–5.0)
BILIRUBIN TOTAL: 2.5 mg/dL — AB (ref 0.3–1.2)
BUN: 13 mg/dL (ref 8–23)
CALCIUM: 9.1 mg/dL (ref 8.9–10.3)
CO2: 31 mmol/L (ref 22–32)
Chloride: 101 mmol/L (ref 98–111)
Creatinine, Ser: 0.87 mg/dL (ref 0.61–1.24)
Glucose, Bld: 274 mg/dL — ABNORMAL HIGH (ref 70–99)
Potassium: 3.9 mmol/L (ref 3.5–5.1)
Sodium: 142 mmol/L (ref 135–145)
TOTAL PROTEIN: 7.6 g/dL (ref 6.5–8.1)

## 2018-06-09 LAB — CBC
HCT: 41.8 % (ref 39.0–52.0)
HEMOGLOBIN: 14 g/dL (ref 13.0–17.0)
MCH: 32 pg (ref 26.0–34.0)
MCHC: 33.5 g/dL (ref 30.0–36.0)
MCV: 95.4 fL (ref 78.0–100.0)
Platelets: 144 10*3/uL — ABNORMAL LOW (ref 150–400)
RBC: 4.38 MIL/uL (ref 4.22–5.81)
RDW: 12.1 % (ref 11.5–15.5)
WBC: 10.6 10*3/uL — ABNORMAL HIGH (ref 4.0–10.5)

## 2018-06-09 LAB — LIPASE, BLOOD: Lipase: 25 U/L (ref 11–51)

## 2018-06-09 MED ORDER — POLYETHYLENE GLYCOL 3350 17 G PO PACK: 17.0000 g | PACK | Freq: Every day | ORAL | 0 refills | Status: DC

## 2018-06-09 MED ORDER — ONDANSETRON HCL 4 MG/2ML IJ SOLN
4.0000 mg | Freq: Once | INTRAMUSCULAR | Status: AC
  Administered 2018-06-09: 4 mg via INTRAVENOUS
  Filled 2018-06-09: qty 2

## 2018-06-09 MED ORDER — MAGNESIUM CITRATE PO SOLN
1.0000 | Freq: Once | ORAL | Status: AC
Start: 2018-06-09 — End: 2018-06-09
  Administered 2018-06-09: 1 via ORAL
  Filled 2018-06-09: qty 296

## 2018-06-09 MED ORDER — ONDANSETRON 4 MG PO TBDP: 4.0000 mg | ORAL_TABLET | Freq: Three times a day (TID) | ORAL | 0 refills | Status: DC | PRN

## 2018-06-09 MED ORDER — SODIUM CHLORIDE 0.9 % IV BOLUS
500.0000 mL | Freq: Once | INTRAVENOUS | Status: AC
  Administered 2018-06-09: 500 mL via INTRAVENOUS

## 2018-06-09 MED ORDER — IPRATROPIUM-ALBUTEROL 0.5-2.5 (3) MG/3ML IN SOLN
3.0000 mL | Freq: Once | RESPIRATORY_TRACT | Status: AC
  Administered 2018-06-09: 3 mL via RESPIRATORY_TRACT
  Filled 2018-06-09: qty 3

## 2018-06-09 NOTE — ED Notes (Signed)
URINE REQUEST MADE 

## 2018-06-09 NOTE — ED Notes (Signed)
Pt took back to room. Pt requesting to use restroom. Pt to restroom via [place]. Pt provided with urine cup for sample. Robert NT made aware of pt in restroom and needs to be taken back to room 9 when finished. NT acknowledged.

## 2018-06-09 NOTE — ED Triage Notes (Addendum)
Pt arrives via POV from home. Pt reports constipation for 3 days. Pt reports he is unable to have BM for 3 days. Pt endorses N/V. Pt endorses diffuse abd pain.

## 2018-06-09 NOTE — ED Provider Notes (Signed)
[facility] DEPT Provider Note   CSN: [id number] Arrival date & time: 06/09/18  1139     History   Chief Complaint Chief Complaint  Patient presents with  . Constipation    HPI Jason [name] is a 77 y.o. male.  Patient complains of constipation nausea and some abdominal discomfort to 3 days  The history is provided by the patient. No language interpreter was used.  Constipation   This is a new problem. The current episode started more than 2 days ago. The stool is described as formed. Associated symptoms include abdominal pain. There is no fiber in the patient's diet. He does not exercise regularly. There has not been adequate water intake. He has tried nothing for the symptoms. The treatment provided no relief. Risk factors include obesity. His past medical history is significant for endocrine disease.    Past Medical History:  Diagnosis Date  . Anxiety   . Asthma    in past, no inhaler now  . Benign neoplasm of colon   . Depression   . Diverticulosis of colon (without mention of hemorrhage)   . DJD (degenerative joint disease)   . Esophageal reflux   . Glaucoma   . Hidradenitis   . History of BPH   . History of gynecomastia    Unilateral  . Hyperlipidemia   . Irritable bowel syndrome   . Memory loss   . MVP (mitral valve prolapse)   . Obesity, unspecified   . Sleep apnea    does not use  cpap or bipap .. no study  ever  . Type II or unspecified type diabetes mellitus without mention of complication, not stated as uncontrolled   . Unspecified venous (peripheral) insufficiency     Patient Active Problem List   Diagnosis Date Noted  . Diabetic infection of right foot ([place]) 07/12/2017  . Foot ulcer, right, with unspecified severity ([place]) 01/17/2017  . Hematuria 01/17/2017  . Complex sleep apnea syndrome 01/12/2016  . Ex-smoker 01/01/2016  . Diabetes mellitus type 2, uncontrolled, with complications ([place]) 59/74/1638  . OSA  (obstructive sleep apnea) 12/18/2015  . Benign prostatic hyperplasia 02/18/2014  . Routine general medical examination at a health care facility 02/18/2014  . PAD (peripheral artery disease) ([place]) 02/18/2014  . DEGENERATIVE JOINT DISEASE 02/20/2008  . Diabetes mellitus type 2 with atherosclerosis of arteries of extremities ([place]) 01/26/2008  . Hyperlipidemia with target LDL less than 70 01/26/2008  . Obesity 01/26/2008  . Mitral valve disorder 01/26/2008  . GERD 01/26/2008  . Irritable bowel syndrome 01/26/2008    Past Surgical History:  Procedure Laterality Date  . AMPUTATION  08/09/2012   Procedure: AMPUTATION DIGIT;  Surgeon: Newt Minion, MD;  Location: [facility];  Service: Orthopedics;  Laterality: Right;  Amputation right Great Toe MTP Joint  . AMPUTATION  10/17/2012   Procedure: AMPUTATION DIGIT;  Surgeon: Newt Minion, MD;  Location: [facility];  Service: Orthopedics;  Laterality: Right;  Right Foot 3rd Toe Amputation at Metatarsophalangeal Joint  . CIRCUMCISION  06/2100   For Phimosis - Dr Hartley Barefoot  . COLON SURGERY    . KNEE ARTHROSCOPY    . LAPAROTOMY  07/20/11  . LASER ABLATION  06/2009   Left Greater Saphenous vein -Dr Donnetta Hutching  . TOE AMPUTATION  06/2009   x3Right foot second toe -Dr Beola Cord  . TONSILLECTOMY          Home Medications    Prior to Admission medications   Medication Sig  Start Date End Date Taking? Authorizing Provider  Ascorbic Acid (VITAMIN C PO) Take 1 tablet by mouth daily.   Yes [provider]  aspirin EC 81 MG tablet Take 1 tablet (81 mg total) by mouth daily. 11/14/14  Yes [name], MD  glimepiride (AMARYL) 2 MG tablet TAKE 1 TABLET EVERY DAY WITH BREAKFAST 04/06/18  Yes [name], MD  metFORMIN (GLUCOPHAGE) 500 MG tablet TAKE 1 TABLET TWICE DAILY WITH MEALS 04/04/18  Yes [name], MD  simvastatin (ZOCOR) 40 MG tablet TAKE 1 TABLET EVERY EVENING 04/04/18  Yes [name], MD  blood glucose meter kit and supplies KIT Use to test  blood sugar up to 3 times daily. DX E11.9 02/05/16   [name], MD  Blood Glucose Monitoring Suppl (TRUE METRIX METER) w/Device KIT Use to check blood sugar DX E11.8 02/13/16   [name], MD  Ertugliflozin L-PyroglutamicAc (STEGLATRO) 5 MG TABS Take 1 tablet by mouth daily. Patient not taking: Reported on 06/09/2018 04/13/18   [name], MD  TRUE METRIX BLOOD GLUCOSE TEST test strip USE  TO TEST BLOOD SUGAR  UP  TO THREE TIMES DAILY 05/10/17   [name], MD  TRUEPLUS LANCETS 33G MISC USE  TO TEST BLOOD SUGAR  UP  TO THREE TIMES DAILY 06/10/16   [name], MD    Family History Family History  Problem Relation Age of Onset  . Diabetes Father   . Cancer Father        Prostate  . Stroke Father   . Prostate cancer Father   . Colon cancer Neg Hx   . Esophageal cancer Neg Hx   . Rectal cancer Neg Hx   . Stomach cancer Neg Hx     Social History Social History   Tobacco Use  . Smoking status: Former Smoker    Packs/day: 0.50    Years: 5.00    Pack years: 2.50    Types: Cigarettes    Last attempt to quit: 09/05/2015    Years since quitting: 2.7  . Smokeless tobacco: Never Used  . Tobacco comment: He quit in 1973.  Smoked for one year between 2015 and 2015  Substance Use Topics  . Alcohol use: Yes    Alcohol/week: 4.2 oz    Types: 7 Cans of beer per week    Comment: not every week   . Drug use: No     Allergies   Patient has no known allergies.   Review of Systems Review of Systems  Constitutional: Negative for appetite change and fatigue.  HENT: Negative for congestion, ear discharge and sinus pressure.   Eyes: Negative for discharge.  Respiratory: Negative for cough.   Cardiovascular: Negative for chest pain.  Gastrointestinal: Positive for abdominal pain and constipation. Negative for diarrhea.  Genitourinary: Negative for frequency and hematuria.  Musculoskeletal: Negative for back pain.  Skin: Negative for rash.  Neurological: Negative for  seizures and headaches.  Psychiatric/Behavioral: Negative for hallucinations.     Physical Exam Updated Vital Signs BP (!) 166/62 (BP Location: Right Arm)   Pulse 73   Temp 97.6 F (36.4 C) (Oral)   Resp 16   Ht '6\' 8"'  (2.032 m)   Wt (!) 136.5 kg (301 lb)   SpO2 99%   BMI 33.07 kg/m   Physical Exam  Constitutional: He is oriented to person, place, and time. He appears well-developed.  HENT:  Head: Normocephalic.  Eyes: Conjunctivae and EOM  are normal. No scleral icterus.  Neck: Neck supple. No thyromegaly present.  Cardiovascular: Normal rate and regular rhythm. Exam reveals no gallop and no friction rub.  No murmur heard. Pulmonary/Chest: No stridor. He has no wheezes. He has no rales. He exhibits no tenderness.  Abdominal: He exhibits no distension. There is tenderness. There is no rebound.  Mild right upper quadrant tenderness  Musculoskeletal: Normal range of motion. He exhibits no edema.  Lymphadenopathy:    He has no cervical adenopathy.  Neurological: He is oriented to person, place, and time. He exhibits normal muscle tone. Coordination normal.  Skin: No rash noted. No erythema.  Psychiatric: He has a normal mood and affect. His behavior is normal.     ED Treatments / Results  Labs (all labs ordered are listed, but only abnormal results are displayed) Labs Reviewed  COMPREHENSIVE METABOLIC PANEL - Abnormal; Notable for the following components:      Result Value   Glucose, Bld 274 (*)    AST 983 (*)    ALT 541 (*)    Total Bilirubin 2.5 (*)    All other components within normal limits  CBC - Abnormal; Notable for the following components:   WBC 10.6 (*)    Platelets 144 (*)    All other components within normal limits  LIPASE, BLOOD    EKG None  Radiology Ct Abdomen Pelvis Wo Contrast  Result Date: 06/09/2018 CLINICAL DATA:  77 year old male with abdominal distention and constipation for 3 days. EXAM: CT ABDOMEN AND PELVIS WITHOUT CONTRAST  TECHNIQUE: Multidetector CT imaging of the abdomen and pelvis was performed following the standard protocol without IV contrast. COMPARISON:  CT Abdomen and Pelvis 03/31/2018 and earlier. FINDINGS: Lower chest: Lower lung volumes with bilateral lung base atelectasis superimposed on suspected chronic scarring. No pleural effusion or lung base consolidation. No cardiomegaly or pericardial effusion. There is an 8-9 millimeter nonspecific subpleural pulmonary nodule in the left mid lung which abuts an area of curvilinear scarring on series 6, image 7. Hepatobiliary: Chronic cholelithiasis. The gallbladder wall today appears indistinct (series 2, image 38) and the gallbladder is more dilated. Negative noncontrast liver. No CBD enlargement. Pancreas: Negative. Spleen: Negative. Adrenals/Urinary Tract: Normal adrenal glands. Chronic bilateral renal cysts have mildly enlarged since 2012. Punctate bilateral nephrolithiasis. No hydronephrosis and normal ureters. Nonspecific hyperdense fluid within the urinary bladder which otherwise appears normal. Stomach/Bowel: Mild low-density retained stool in the rectum similar to the prior study. Diverticulosis in the descending and sigmoid colon without active inflammation. Retained stool in the transverse and right colon also appears similar to the study in May. Normal appendix. Negative terminal ileum. No dilated small bowel.  Negative stomach and duodenum. No abdominal free air, free fluid. Vascular/Lymphatic: Vascular patency is not evaluated in the absence of IV contrast. Mild Calcified aortic atherosclerosis. Chronic bilateral inguinal lymph node enlargement. More distal nodes are included today and individually measure up to 23 millimeters short axis appearing abnormally lobulated (series 2, image 110). The inguinal in suprapubic region nodes which have been included previously appears stable. Likewise, iliac chains nodes are stable and appear more normal. No other  lymphadenopathy. Reproductive: Negative. Other: No pelvic free fluid. Musculoskeletal: Stable visualized osseous structures. IMPRESSION: 1. Chronic cholelithiasis with increased gallbladder size and indistinct appearance of the gallbladder wall suspicious for Acute Cholecystitis. Right Upper Quadrant Ultrasound would evaluate further. 2. Nonspecific bilateral inguinal lymphadenopathy. The largest nodes are up to 23 mm short axis and appear abnormally lobulated. This patient carries a  diagnosis of hidradenitis. The largest nodes would be amenable to ultrasound guided needle biopsy if necessary. 3. Small 8-9 millimeter subpleural left mid lung nodule. Non-contrast chest CT at 6-12 months is recommended. If the nodule is stable at time of repeat CT, then future CT at 18-24 months (from today's scan) is considered optional for low-risk patients, but is recommended for high-risk patients. This recommendation follows the consensus statement: Guidelines for Management of Incidental Pulmonary Nodules Detected on CT Images: From the Fleischner Society 2017; Radiology 2017; 284:228-243. 4. No bowel obstruction. Similar volume of retained stool in the colon to that seen in May. 5. Nonspecific hyperdensity of urine within the bladder. Chronic nephrolithiasis, but no acute obstructive uropathy. Correlate with urinalysis. Electronically Signed   By: Genevie Ann M.D.   On: 06/09/2018 14:36    Procedures Procedures (including critical care time)  Medications Ordered in ED Medications  sodium chloride 0.9 % bolus 500 mL (500 mLs Intravenous New Bag/Given 06/09/18 1346)  ondansetron (ZOFRAN) injection 4 mg (4 mg Intravenous Given 06/09/18 1346)     Initial Impression / Assessment and Plan / ED Course  I have reviewed the triage vital signs and the nursing notes.  Pertinent labs & imaging results that were available during my care of the patient were reviewed by me and considered in my medical decision making (see chart for  details).     Patient with elevated liver enzymes mild abdominal discomfort constipation and CT scan suggesting possible cholecystitis.  Patient will get an ultrasound of his gallbladder  Final Clinical Impressions(s) / ED Diagnoses   Final diagnoses:  None    ED Discharge Orders    None       Milton Ferguson, MD 06/09/18 1559

## 2018-06-09 NOTE — ED Provider Notes (Signed)
Pt signed out by Dr. Roderic Palau pending results of GB US.  IMPRESSION: 1. Distended gallbladder with small dependent stones and mild wall thickening. Findings would support early acute cholecystitis in the proper clinical setting. Consider further assessment with a HIDA scan to assess for patency of the cystic duct. 2. No other abnormalities.  Pt's abdomen reexamined and his pain is gone.  The pt d/w Dr. Rosendo Gros (surgery) who feels that pt can go home if pain is better.  Pt wants to go home.  Family in the room and are ok with the plan.  He is told that it's important to f/u as an outpatient.  Pt more worried about the constipation.  He will be d/c home with mg ci bottle to go and rx for miralax and zofran.   Isla Pence, MD 06/09/18 1746

## 2018-06-09 NOTE — ED Notes (Signed)
Pt is alert and verbally responsive. Pt denies pain at this time. Pt spouse is at bedside.

## 2018-06-12 ENCOUNTER — Encounter: Payer: Self-pay | Admitting: Internal Medicine

## 2018-06-12 ENCOUNTER — Telehealth: Payer: Self-pay

## 2018-06-12 ENCOUNTER — Ambulatory Visit (INDEPENDENT_AMBULATORY_CARE_PROVIDER_SITE_OTHER): Payer: Medicare HMO | Admitting: Internal Medicine

## 2018-06-12 ENCOUNTER — Other Ambulatory Visit (INDEPENDENT_AMBULATORY_CARE_PROVIDER_SITE_OTHER): Payer: Medicare HMO

## 2018-06-12 VITALS — BP 120/70 | HR 75 | Temp 98.2°F | Ht >= 80 in | Wt 304.2 lb

## 2018-06-12 DIAGNOSIS — I70209 Unspecified atherosclerosis of native arteries of extremities, unspecified extremity: Secondary | ICD-10-CM | POA: Diagnosis not present

## 2018-06-12 DIAGNOSIS — K8000 Calculus of gallbladder with acute cholecystitis without obstruction: Secondary | ICD-10-CM | POA: Insufficient documentation

## 2018-06-12 DIAGNOSIS — K5904 Chronic idiopathic constipation: Secondary | ICD-10-CM | POA: Diagnosis not present

## 2018-06-12 DIAGNOSIS — E1151 Type 2 diabetes mellitus with diabetic peripheral angiopathy without gangrene: Secondary | ICD-10-CM | POA: Diagnosis not present

## 2018-06-12 LAB — COMPREHENSIVE METABOLIC PANEL
ALBUMIN: 3.8 g/dL (ref 3.5–5.2)
ALT: 353 U/L — AB (ref 0–53)
AST: 73 U/L — AB (ref 0–37)
Alkaline Phosphatase: 111 U/L (ref 39–117)
BILIRUBIN TOTAL: 7.3 mg/dL — AB (ref 0.2–1.2)
BUN: 19 mg/dL (ref 6–23)
CALCIUM: 9.2 mg/dL (ref 8.4–10.5)
CO2: 30 meq/L (ref 19–32)
CREATININE: 1.37 mg/dL (ref 0.40–1.50)
Chloride: 100 mEq/L (ref 96–112)
GFR: 64.93 mL/min (ref >60.00)
GLUCOSE: 181 mg/dL — AB (ref 70–99)
Potassium: 4 mEq/L (ref 3.5–5.1)
Sodium: 138 mEq/L (ref 135–145)
Total Protein: 7.2 g/dL (ref 6.0–8.3)

## 2018-06-12 LAB — CBC WITH DIFFERENTIAL/PLATELET
BASOS PCT: 0.4 % (ref 0.0–3.0)
Basophils Absolute: 0 10*3/uL (ref 0.0–0.1)
EOS ABS: 0.2 10*3/uL (ref 0.0–0.7)
Eosinophils Relative: 3 % (ref 0.0–5.0)
HCT: 36.3 % — ABNORMAL LOW (ref 39.0–52.0)
Hemoglobin: 12.3 g/dL — ABNORMAL LOW (ref 13.0–17.0)
Lymphocytes Relative: 7.6 % — ABNORMAL LOW (ref 12.0–46.0)
Lymphs Abs: 0.4 10*3/uL — ABNORMAL LOW (ref 0.7–4.0)
MCHC: 33.9 g/dL (ref 30.0–36.0)
MCV: 95.4 fl (ref 78.0–100.0)
MONO ABS: 0.7 10*3/uL (ref 0.1–1.0)
Monocytes Relative: 12.1 % — ABNORMAL HIGH (ref 3.0–12.0)
NEUTROS ABS: 4.2 10*3/uL (ref 1.4–7.7)
Neutrophils Relative %: 76.9 % (ref 43.0–77.0)
PLATELETS: 122 10*3/uL — AB (ref 150.0–400.0)
RBC: 3.81 Mil/uL — ABNORMAL LOW (ref 4.22–5.81)
RDW: 12.6 % (ref 11.5–15.5)
WBC: 5.5 10*3/uL (ref 4.0–10.5)

## 2018-06-12 LAB — HEMOGLOBIN A1C: Hgb A1c MFr Bld: 7.4 % — ABNORMAL HIGH (ref 4.6–6.5)

## 2018-06-12 LAB — TSH: TSH: 1.66 u[IU]/mL (ref 0.35–4.50)

## 2018-06-12 MED ORDER — LINACLOTIDE 145 MCG PO CAPS: 145.0000 ug | ORAL_CAPSULE | Freq: Every day | ORAL | 1 refills | Status: DC

## 2018-06-12 NOTE — Telephone Encounter (Signed)
yes

## 2018-06-12 NOTE — Telephone Encounter (Signed)
Pt seen for constipation on Friday and is requesting to be seen today. Do you want to work in?

## 2018-06-12 NOTE — Progress Notes (Signed)
Subjective:  Patient ID: Jason [name], male    DOB: 01/11/1941  Age: 77 y.o. MRN: [id number]  CC: Constipation   HPI Jason [name] presents for f/up - He was recently seen in the ED for abdominal pain and constipation.  An ultrasound revealed that he had cholelithiasis with cholecystitis.  His white cell count was mildly elevated and his LFTs were significantly elevated. He tells me the abdominal pain has resolved and he denies nausea or vomiting.  He had a low-grade fever and chills a few days ago but he tells me that has resolved.  He has maintained a good appetite.  He complains of chronic constipation that worsened about 10 days ago.  He is tried to control the symptoms with magnesium and a stool softener but has not achieved any relief from the symptoms.  Outpatient Medications Prior to Visit  Medication Sig Dispense Refill  . Ascorbic Acid (VITAMIN C PO) Take 1 tablet by mouth daily.    Marland Kitchen aspirin EC 81 MG tablet Take 1 tablet (81 mg total) by mouth daily. 90 tablet 3  . blood glucose meter kit and supplies KIT Use to test blood sugar up to 3 times daily. DX E11.9 1 each 0  . Blood Glucose Monitoring Suppl (TRUE METRIX METER) w/Device KIT Use to check blood sugar DX E11.8 1 kit 0  . glimepiride (AMARYL) 2 MG tablet TAKE 1 TABLET EVERY DAY WITH BREAKFAST 90 tablet 1  . metFORMIN (GLUCOPHAGE) 500 MG tablet TAKE 1 TABLET TWICE DAILY WITH MEALS 180 tablet 1  . simvastatin (ZOCOR) 40 MG tablet TAKE 1 TABLET EVERY EVENING 90 tablet 1  . TRUE METRIX BLOOD GLUCOSE TEST test strip USE  TO TEST BLOOD SUGAR  UP  TO THREE TIMES DAILY 300 each 3  . TRUEPLUS LANCETS 33G MISC USE  TO TEST BLOOD SUGAR  UP  TO THREE TIMES DAILY 300 each 3  . ondansetron (ZOFRAN ODT) 4 MG disintegrating tablet Take 1 tablet (4 mg total) by mouth every 8 (eight) hours as needed. 10 tablet 0  . polyethylene glycol (MIRALAX) packet Take 17 g by mouth daily. 14 each 0  . Ertugliflozin L-PyroglutamicAc (STEGLATRO) 5 MG  TABS Take 1 tablet by mouth daily. (Patient not taking: Reported on 06/09/2018) 90 tablet 1   No facility-administered medications prior to visit.     ROS Review of Systems  Constitutional: Negative for chills, fatigue and fever.  HENT: Negative.   Eyes: Negative for visual disturbance.  Respiratory: Negative for cough, chest tightness, shortness of breath and wheezing.   Cardiovascular: Negative for chest pain, palpitations and leg swelling.  Gastrointestinal: Positive for constipation. Negative for abdominal pain, diarrhea, nausea and vomiting.  Endocrine: Negative.   Genitourinary: Negative.  Negative for difficulty urinating, dysuria, hematuria and urgency.  Musculoskeletal: Negative.  Negative for arthralgias and neck pain.  Skin: Negative.   Neurological: Negative.  Negative for weakness and headaches.  Hematological: Negative for adenopathy. Does not bruise/bleed easily.  Psychiatric/Behavioral: Negative.     Objective:  BP 120/70 (BP Location: Left Arm, Patient Position: Sitting, Cuff Size: Large)   Pulse 75   Temp 98.2 F (36.8 C) (Oral)   Ht 6' 8" (2.032 m)   Wt (!) 304 lb 4 oz (138 kg)   SpO2 95%   BMI 33.42 kg/m   BP Readings from Last 3 Encounters:  06/12/18 120/70  06/09/18 (!) 111/56  04/13/18 116/62    Wt Readings from Last 3 Encounters:  06/12/18 (!) 304 lb 4 oz (138 kg)  06/09/18 (!) 301 lb (136.5 kg)  04/13/18 297 lb (134.7 kg)    Physical Exam  Constitutional: He is oriented to person, place, and time. No distress.  HENT:  Mouth/Throat: Oropharynx is clear and moist. No oropharyngeal exudate.  Eyes: Conjunctivae are normal. No scleral icterus.  Neck: Normal range of motion. Neck supple. No JVD present. No thyromegaly present.  Cardiovascular: Normal rate, regular rhythm and normal heart sounds. Exam reveals no gallop.  No murmur heard. Pulmonary/Chest: Effort normal and breath sounds normal. He has no wheezes. He has no rales.  Abdominal: Soft.  Normal appearance. He exhibits no mass. Bowel sounds are decreased. There is no hepatosplenomegaly. There is no tenderness. No hernia.  Musculoskeletal: Normal range of motion. He exhibits no edema, tenderness or deformity.  Lymphadenopathy:    He has no cervical adenopathy.  Neurological: He is alert and oriented to person, place, and time.  Skin: Skin is warm and dry. No rash noted. He is not diaphoretic.  Vitals reviewed.   Lab Results  Component Value Date   WBC 5.5 06/12/2018   HGB 12.3 (L) 06/12/2018   HCT 36.3 (L) 06/12/2018   PLT 122.0 (L) 06/12/2018   GLUCOSE 181 (H) 06/12/2018   CHOL 121 02/07/2018   TRIG 66.0 02/07/2018   HDL 47.50 02/07/2018   LDLCALC 60 02/07/2018   ALT 353 (H) 06/12/2018   AST 73 (H) 06/12/2018   NA 138 06/12/2018   K 4.0 06/12/2018   CL 100 06/12/2018   CREATININE 1.37 06/12/2018   BUN 19 06/12/2018   CO2 30 06/12/2018   TSH 1.66 06/12/2018   PSA 0.63 03/21/2018   INR 1.05 10/17/2012   HGBA1C 7.4 (H) 06/12/2018   MICROALBUR 4.7 (H) 02/07/2018    Ct Abdomen Pelvis Wo Contrast  Result Date: 06/09/2018 CLINICAL DATA:  77 year old male with abdominal distention and constipation for 3 days. EXAM: CT ABDOMEN AND PELVIS WITHOUT CONTRAST TECHNIQUE: Multidetector CT imaging of the abdomen and pelvis was performed following the standard protocol without IV contrast. COMPARISON:  CT Abdomen and Pelvis 03/31/2018 and earlier. FINDINGS: Lower chest: Lower lung volumes with bilateral lung base atelectasis superimposed on suspected chronic scarring. No pleural effusion or lung base consolidation. No cardiomegaly or pericardial effusion. There is an 8-9 millimeter nonspecific subpleural pulmonary nodule in the left mid lung which abuts an area of curvilinear scarring on series 6, image 7. Hepatobiliary: Chronic cholelithiasis. The gallbladder wall today appears indistinct (series 2, image 38) and the gallbladder is more dilated. Negative noncontrast liver. No CBD  enlargement. Pancreas: Negative. Spleen: Negative. Adrenals/Urinary Tract: Normal adrenal glands. Chronic bilateral renal cysts have mildly enlarged since 2012. Punctate bilateral nephrolithiasis. No hydronephrosis and normal ureters. Nonspecific hyperdense fluid within the urinary bladder which otherwise appears normal. Stomach/Bowel: Mild low-density retained stool in the rectum similar to the prior study. Diverticulosis in the descending and sigmoid colon without active inflammation. Retained stool in the transverse and right colon also appears similar to the study in May. Normal appendix. Negative terminal ileum. No dilated small bowel.  Negative stomach and duodenum. No abdominal free air, free fluid. Vascular/Lymphatic: Vascular patency is not evaluated in the absence of IV contrast. Mild Calcified aortic atherosclerosis. Chronic bilateral inguinal lymph node enlargement. More distal nodes are included today and individually measure up to 23 millimeters short axis appearing abnormally lobulated (series 2, image 110). The inguinal in suprapubic region nodes which have been included previously appears stable. Likewise,  iliac chains nodes are stable and appear more normal. No other lymphadenopathy. Reproductive: Negative. Other: No pelvic free fluid. Musculoskeletal: Stable visualized osseous structures. IMPRESSION: 1. Chronic cholelithiasis with increased gallbladder size and indistinct appearance of the gallbladder wall suspicious for Acute Cholecystitis. Right Upper Quadrant Ultrasound would evaluate further. 2. Nonspecific bilateral inguinal lymphadenopathy. The largest nodes are up to 23 mm short axis and appear abnormally lobulated. This patient carries a diagnosis of hidradenitis. The largest nodes would be amenable to ultrasound guided needle biopsy if necessary. 3. Small 8-9 millimeter subpleural left mid lung nodule. Non-contrast chest CT at 6-12 months is recommended. If the nodule is stable at time of  repeat CT, then future CT at 18-24 months (from today's scan) is considered optional for low-risk patients, but is recommended for high-risk patients. This recommendation follows the consensus statement: Guidelines for Management of Incidental Pulmonary Nodules Detected on CT Images: From the Fleischner Society 2017; Radiology 2017; 284:228-243. 4. No bowel obstruction. Similar volume of retained stool in the colon to that seen in May. 5. Nonspecific hyperdensity of urine within the bladder. Chronic nephrolithiasis, but no acute obstructive uropathy. Correlate with urinalysis. Electronically Signed   By: Genevie Ann M.D.   On: 06/09/2018 14:36   US Abdomen Limited Ruq  Result Date: 06/09/2018 CLINICAL DATA:  Right upper quadrant pain for 3 days. EXAM: ULTRASOUND ABDOMEN LIMITED RIGHT UPPER QUADRANT COMPARISON:  CT, 06/09/2018. FINDINGS: Gallbladder: Small dependent gallstones. Wall is mildly thickened measuring up to 4.5 mm. No pericholecystic fluid. No sonographic Murphy's sign. Common bile duct: Diameter: 3 mm Liver: No focal lesion identified. Within normal limits in parenchymal echogenicity. Portal vein is patent on color Doppler imaging with normal direction of blood flow towards the liver. IMPRESSION: 1. Distended gallbladder with small dependent stones and mild wall thickening. Findings would support early acute cholecystitis in the proper clinical setting. Consider further assessment with a HIDA scan to assess for patency of the cystic duct. 2. No other abnormalities. Electronically Signed   By: Lajean Manes M.D.   On: 06/09/2018 16:37    Assessment & Plan:   Massey was seen today for constipation.  Diagnoses and all orders for this visit:  Gallstones and inflammation of gallbladder without obstruction- His white cell count is normal now and his LFTs are improving.  I have asked him to see general surgery to consider undergoing a cholecystectomy. -     Ambulatory referral to General Surgery -      CBC with Differential/Platelet; Future -     Comprehensive metabolic panel; Future  Diabetes mellitus type 2 with atherosclerosis of arteries of extremities ([place])- His A1c is at 7.4%.  His blood sugars are adequately well controlled. -     CBC with Differential/Platelet; Future -     Hemoglobin A1c; Future  Chronic idiopathic constipation- Lab work is negative for any secondary causes.  I have asked him to try Linzess to relieve the symptoms. -     Comprehensive metabolic panel; Future -     TSH; Future -     linaclotide (LINZESS) 145 MCG CAPS capsule; Take 1 capsule (145 mcg total) by mouth daily before breakfast.   I have discontinued Erling Conte. Devaux's polyethylene glycol and ondansetron. I am also having him start on linaclotide. Additionally, I am having him maintain his aspirin EC, blood glucose meter kit and supplies, TRUE METRIX METER, TRUEPLUS LANCETS 33G, TRUE METRIX BLOOD GLUCOSE TEST, metFORMIN, simvastatin, glimepiride, Ertugliflozin L-PyroglutamicAc, and Ascorbic Acid (VITAMIN C PO).  Meds ordered this encounter  Medications  . linaclotide (LINZESS) 145 MCG CAPS capsule    Sig: Take 1 capsule (145 mcg total) by mouth daily before breakfast.    Dispense:  90 capsule    Refill:  1     Follow-up: Return in about 1 month (around 07/10/2018).  Scarlette Calico, MD

## 2018-06-12 NOTE — Patient Instructions (Signed)

## 2018-06-12 NOTE — Telephone Encounter (Signed)
Patient scheduled today at 2 pm

## 2018-06-12 NOTE — Telephone Encounter (Signed)
Copied from [place] [id number]. Topic: Appointment Scheduling - Scheduling Inquiry for Clinic >> Jun 12, 2018  7:39 AM Synthia Innocent wrote: Reason for CRM: Patient was seen in ER on Friday for gallstones. Requesting to be seen by Dr Ronnald Ramp asap. Able to work in?

## 2018-06-13 ENCOUNTER — Other Ambulatory Visit: Payer: Self-pay

## 2018-06-13 ENCOUNTER — Other Ambulatory Visit: Payer: Self-pay | Admitting: Urology

## 2018-06-13 ENCOUNTER — Inpatient Hospital Stay (HOSPITAL_BASED_OUTPATIENT_CLINIC_OR_DEPARTMENT_OTHER)
Admission: EM | Admit: 2018-06-13 | Discharge: 2018-06-15 | DRG: 419 | Disposition: A | Discharge status: Home / Self Care | Payer: Medicare HMO | Attending: Internal Medicine | Admitting: Internal Medicine

## 2018-06-13 ENCOUNTER — Encounter (HOSPITAL_BASED_OUTPATIENT_CLINIC_OR_DEPARTMENT_OTHER): Payer: Self-pay | Admitting: *Deleted

## 2018-06-13 ENCOUNTER — Emergency Department (HOSPITAL_BASED_OUTPATIENT_CLINIC_OR_DEPARTMENT_OTHER): Payer: Medicare HMO

## 2018-06-13 ENCOUNTER — Telehealth: Payer: Self-pay

## 2018-06-13 DIAGNOSIS — K5904 Chronic idiopathic constipation: Secondary | ICD-10-CM | POA: Diagnosis present

## 2018-06-13 DIAGNOSIS — K581 Irritable bowel syndrome with constipation: Secondary | ICD-10-CM | POA: Diagnosis present

## 2018-06-13 DIAGNOSIS — Z7982 Long term (current) use of aspirin: Secondary | ICD-10-CM | POA: Diagnosis not present

## 2018-06-13 DIAGNOSIS — Z79899 Other long term (current) drug therapy: Secondary | ICD-10-CM | POA: Diagnosis not present

## 2018-06-13 DIAGNOSIS — G4733 Obstructive sleep apnea (adult) (pediatric): Secondary | ICD-10-CM | POA: Diagnosis present

## 2018-06-13 DIAGNOSIS — F329 Major depressive disorder, single episode, unspecified: Secondary | ICD-10-CM | POA: Diagnosis present

## 2018-06-13 DIAGNOSIS — Z833 Family history of diabetes mellitus: Secondary | ICD-10-CM

## 2018-06-13 DIAGNOSIS — E1151 Type 2 diabetes mellitus with diabetic peripheral angiopathy without gangrene: Secondary | ICD-10-CM | POA: Diagnosis present

## 2018-06-13 DIAGNOSIS — F419 Anxiety disorder, unspecified: Secondary | ICD-10-CM | POA: Diagnosis not present

## 2018-06-13 DIAGNOSIS — G4730 Sleep apnea, unspecified: Secondary | ICD-10-CM | POA: Diagnosis not present

## 2018-06-13 DIAGNOSIS — N400 Enlarged prostate without lower urinary tract symptoms: Secondary | ICD-10-CM | POA: Diagnosis present

## 2018-06-13 DIAGNOSIS — Z7984 Long term (current) use of oral hypoglycemic drugs: Secondary | ICD-10-CM | POA: Diagnosis not present

## 2018-06-13 DIAGNOSIS — K8066 Calculus of gallbladder and bile duct with acute and chronic cholecystitis without obstruction: Secondary | ICD-10-CM | POA: Diagnosis not present

## 2018-06-13 DIAGNOSIS — Z6833 Body mass index (BMI) 33.0-33.9, adult: Secondary | ICD-10-CM | POA: Diagnosis not present

## 2018-06-13 DIAGNOSIS — K8050 Calculus of bile duct without cholangitis or cholecystitis without obstruction: Secondary | ICD-10-CM | POA: Diagnosis not present

## 2018-06-13 DIAGNOSIS — H409 Unspecified glaucoma: Secondary | ICD-10-CM | POA: Diagnosis not present

## 2018-06-13 DIAGNOSIS — Z87891 Personal history of nicotine dependence: Secondary | ICD-10-CM | POA: Diagnosis not present

## 2018-06-13 DIAGNOSIS — R7989 Other specified abnormal findings of blood chemistry: Secondary | ICD-10-CM | POA: Diagnosis present

## 2018-06-13 DIAGNOSIS — Z89421 Acquired absence of other right toe(s): Secondary | ICD-10-CM | POA: Diagnosis not present

## 2018-06-13 DIAGNOSIS — J45909 Unspecified asthma, uncomplicated: Secondary | ICD-10-CM | POA: Diagnosis not present

## 2018-06-13 DIAGNOSIS — K810 Acute cholecystitis: Secondary | ICD-10-CM | POA: Diagnosis present

## 2018-06-13 DIAGNOSIS — I872 Venous insufficiency (chronic) (peripheral): Secondary | ICD-10-CM | POA: Diagnosis present

## 2018-06-13 DIAGNOSIS — K219 Gastro-esophageal reflux disease without esophagitis: Secondary | ICD-10-CM | POA: Diagnosis present

## 2018-06-13 DIAGNOSIS — I739 Peripheral vascular disease, unspecified: Secondary | ICD-10-CM | POA: Diagnosis present

## 2018-06-13 DIAGNOSIS — R17 Unspecified jaundice: Secondary | ICD-10-CM | POA: Diagnosis present

## 2018-06-13 DIAGNOSIS — Z419 Encounter for procedure for purposes other than remedying health state, unspecified: Secondary | ICD-10-CM

## 2018-06-13 DIAGNOSIS — K8010 Calculus of gallbladder with chronic cholecystitis without obstruction: Secondary | ICD-10-CM | POA: Diagnosis not present

## 2018-06-13 DIAGNOSIS — R310 Gross hematuria: Secondary | ICD-10-CM | POA: Diagnosis not present

## 2018-06-13 DIAGNOSIS — I341 Nonrheumatic mitral (valve) prolapse: Secondary | ICD-10-CM | POA: Diagnosis present

## 2018-06-13 DIAGNOSIS — I70209 Unspecified atherosclerosis of native arteries of extremities, unspecified extremity: Secondary | ICD-10-CM

## 2018-06-13 DIAGNOSIS — E785 Hyperlipidemia, unspecified: Secondary | ICD-10-CM | POA: Diagnosis not present

## 2018-06-13 DIAGNOSIS — K8020 Calculus of gallbladder without cholecystitis without obstruction: Secondary | ICD-10-CM | POA: Diagnosis not present

## 2018-06-13 DIAGNOSIS — E669 Obesity, unspecified: Secondary | ICD-10-CM | POA: Diagnosis present

## 2018-06-13 LAB — CBC WITH DIFFERENTIAL/PLATELET
Basophils Absolute: 0 10*3/uL (ref 0.0–0.1)
Basophils Relative: 1 %
EOS ABS: 0.3 10*3/uL (ref 0.0–0.7)
Eosinophils Relative: 6 %
HCT: 35.3 % — ABNORMAL LOW (ref 39.0–52.0)
Hemoglobin: 11.7 g/dL — ABNORMAL LOW (ref 13.0–17.0)
LYMPHS ABS: 0.6 10*3/uL — AB (ref 0.7–4.0)
Lymphocytes Relative: 14 %
MCH: 31.7 pg (ref 26.0–34.0)
MCHC: 33.1 g/dL (ref 30.0–36.0)
MCV: 95.7 fL (ref 78.0–100.0)
MONOS PCT: 16 %
Monocytes Absolute: 0.6 10*3/uL (ref 0.1–1.0)
Neutro Abs: 2.6 10*3/uL (ref 1.7–7.7)
Neutrophils Relative %: 63 %
PLATELETS: 132 10*3/uL — AB (ref 150–400)
RBC: 3.69 MIL/uL — ABNORMAL LOW (ref 4.22–5.81)
RDW: 11.9 % (ref 11.5–15.5)
WBC: 4 10*3/uL (ref 4.0–10.5)

## 2018-06-13 LAB — COMPREHENSIVE METABOLIC PANEL
ALT: 240 U/L — ABNORMAL HIGH (ref 0–44)
ANION GAP: 8 (ref 5–15)
AST: 47 U/L — ABNORMAL HIGH (ref 15–41)
Albumin: 3 g/dL — ABNORMAL LOW (ref 3.5–5.0)
Alkaline Phosphatase: 101 U/L (ref 38–126)
BUN: 15 mg/dL (ref 8–23)
CALCIUM: 8.5 mg/dL — AB (ref 8.9–10.3)
CHLORIDE: 102 mmol/L (ref 98–111)
CO2: 28 mmol/L (ref 22–32)
Creatinine, Ser: 1.09 mg/dL (ref 0.61–1.24)
GFR calc non Af Amer: >60 mL/min (ref >60)
Glucose, Bld: 183 mg/dL — ABNORMAL HIGH (ref 70–99)
POTASSIUM: 3.6 mmol/L (ref 3.5–5.1)
SODIUM: 138 mmol/L (ref 135–145)
Total Bilirubin: 3.8 mg/dL — ABNORMAL HIGH (ref 0.3–1.2)
Total Protein: 7.1 g/dL (ref 6.5–8.1)

## 2018-06-13 LAB — LIPASE, BLOOD: Lipase: 32 U/L (ref 11–51)

## 2018-06-13 MED ORDER — MORPHINE SULFATE (PF) 2 MG/ML IV SOLN: 2.0000 mg | INTRAVENOUS | Status: DC | PRN

## 2018-06-13 MED ORDER — INSULIN ASPART 100 UNIT/ML ~~LOC~~ SOLN
0.0000 [IU] | SUBCUTANEOUS | Status: DC
  Administered 2018-06-14: 2 [IU] via SUBCUTANEOUS
  Administered 2018-06-14: 9 [IU] via SUBCUTANEOUS
  Administered 2018-06-14 (×2): 5 [IU] via SUBCUTANEOUS
  Administered 2018-06-15 (×2): 3 [IU] via SUBCUTANEOUS

## 2018-06-13 MED ORDER — DOCUSATE SODIUM 100 MG PO CAPS
100.0000 mg | ORAL_CAPSULE | Freq: Two times a day (BID) | ORAL | Status: DC
  Administered 2018-06-14 – 2018-06-15 (×2): 100 mg via ORAL
  Filled 2018-06-13 (×2): qty 1

## 2018-06-13 MED ORDER — ACETAMINOPHEN 325 MG PO TABS: 650.0000 mg | ORAL_TABLET | Freq: Four times a day (QID) | ORAL | Status: DC | PRN

## 2018-06-13 MED ORDER — ACETAMINOPHEN 650 MG RE SUPP: 650.0000 mg | Freq: Four times a day (QID) | RECTAL | Status: DC | PRN

## 2018-06-13 MED ORDER — ENOXAPARIN SODIUM 60 MG/0.6ML ~~LOC~~ SOLN
60.0000 mg | Freq: Every day | SUBCUTANEOUS | Status: DC
  Filled 2018-06-13: qty 0.6

## 2018-06-13 MED ORDER — ONDANSETRON HCL 4 MG/2ML IJ SOLN: 4.0000 mg | Freq: Four times a day (QID) | INTRAMUSCULAR | Status: DC | PRN

## 2018-06-13 MED ORDER — SODIUM CHLORIDE 0.9 % IV SOLN
2.0000 g | INTRAVENOUS | Status: DC
  Administered 2018-06-14: 2 g via INTRAVENOUS
  Filled 2018-06-13: qty 2
  Filled 2018-06-13: qty 20

## 2018-06-13 MED ORDER — ONDANSETRON HCL 4 MG PO TABS: 4.0000 mg | ORAL_TABLET | Freq: Four times a day (QID) | ORAL | Status: DC | PRN

## 2018-06-13 MED ORDER — OXYCODONE HCL 5 MG PO TABS: 5.0000 mg | ORAL_TABLET | ORAL | Status: DC | PRN

## 2018-06-13 MED ORDER — SIMVASTATIN 20 MG PO TABS
40.0000 mg | ORAL_TABLET | Freq: Every evening | ORAL | Status: DC
  Administered 2018-06-14: 40 mg via ORAL
  Filled 2018-06-13: qty 2

## 2018-06-13 MED ORDER — INSULIN ASPART 100 UNIT/ML ~~LOC~~ SOLN: 0.0000 [IU] | Freq: Three times a day (TID) | SUBCUTANEOUS | Status: DC

## 2018-06-13 MED ORDER — METRONIDAZOLE IN NACL 5-0.79 MG/ML-% IV SOLN
500.0000 mg | Freq: Three times a day (TID) | INTRAVENOUS | Status: DC
  Administered 2018-06-14 (×2): 500 mg via INTRAVENOUS
  Filled 2018-06-13 (×3): qty 100

## 2018-06-13 MED ORDER — LINACLOTIDE 145 MCG PO CAPS
145.0000 ug | ORAL_CAPSULE | Freq: Every day | ORAL | Status: DC
  Administered 2018-06-15: 145 ug via ORAL
  Filled 2018-06-13 (×2): qty 1

## 2018-06-13 MED ORDER — METFORMIN HCL 500 MG PO TABS: 500.0000 mg | ORAL_TABLET | Freq: Two times a day (BID) | ORAL | Status: DC

## 2018-06-13 NOTE — Consult Note (Signed)
Reason for Consult: Cholelithiasis, abnormal LFTs   Jason [name] is an 77 y.o. male.  HPI: Patient is a 77 year old male with multiple medical problems, history of laparotomy for small bowel obstruction with Ladd's band in 2012, admitted with new diagnosis of cholelithiasis and abnormal LFTs.  Patient has a history of chronic constipation.  About 4 days ago he was feeling very constipated with some abdominal distention and uncomfortable pressure diffusely that he said did not really reach the level of pain.  He presented to the emergency room I believe at [facility] and CT scan was obtained.  This was significant only for gallstones and a questionably thickened gallbladder wall.  He had some moderately elevated LFTs at that time.  EDP reports that he consulted with a surgeon from our practice who recommended outpatient follow-up.  He was not tender or signs of infection at that time.  Subsequently the patient saw his family physician yesterday and repeat LFTs were worse with bilirubin up to 7.  Lab work returned today and he was instructed to go back to the emergency room in [facility] for evaluation.  He denies any abdominal pain currently.  He had a bowel movement a couple of days ago and is feeling better in this regard.  His wife said he had some subjective fever a couple of days ago.  He noticed his urine was very dark.  He also had an appointment at [facility] yesterday and reports he had a cystoscopy showing a "cyst" in his bladder and resection was recommended and scheduled for August.  He has no nausea or vomiting.  Really no complaints at all currently.   Past Medical History:  Diagnosis Date  . Anxiety   . Asthma    in past, no inhaler now  . Benign neoplasm of colon   . Depression   . Diverticulosis of colon (without mention of hemorrhage)   . DJD (degenerative joint disease)   . Esophageal reflux   . Glaucoma   . Hidradenitis   . History of BPH   . History of gynecomastia     Unilateral  . Hyperlipidemia   . Irritable bowel syndrome   . Memory loss   . MVP (mitral valve prolapse)   . Obesity, unspecified   . Sleep apnea    does not use  cpap or bipap .. no study  ever  . Type II or unspecified type diabetes mellitus without mention of complication, not stated as uncontrolled   . Unspecified venous (peripheral) insufficiency     Past Surgical History:  Procedure Laterality Date  . AMPUTATION  08/09/2012   Procedure: AMPUTATION DIGIT;  Surgeon: Newt Minion, MD;  Location: [place];  Service: Orthopedics;  Laterality: Right;  Amputation right Great Toe MTP Joint  . AMPUTATION  10/17/2012   Procedure: AMPUTATION DIGIT;  Surgeon: Newt Minion, MD;  Location: [place];  Service: Orthopedics;  Laterality: Right;  Right Foot 3rd Toe Amputation at Metatarsophalangeal Joint  . CIRCUMCISION  06/2100   For Phimosis - Dr Hartley Barefoot  . COLON SURGERY    . KNEE ARTHROSCOPY    . LAPAROTOMY  07/20/11  . LASER ABLATION  06/2009   Left Greater Saphenous vein -Dr Donnetta Hutching  . TOE AMPUTATION  06/2009   x3Right foot second toe -Dr Beola Cord  . TONSILLECTOMY      Family History  Problem Relation Age of Onset  . Diabetes Father   . Cancer Father  Prostate  . Stroke Father   . Prostate cancer Father   . Colon cancer Neg Hx   . Esophageal cancer Neg Hx   . Rectal cancer Neg Hx   . Stomach cancer Neg Hx     Social History:  reports that he quit smoking about 2 years ago. His smoking use included cigarettes. He has a 2.50 pack-year smoking history. He has never used smokeless tobacco. He reports that he drinks about 4.2 oz of alcohol per week. He reports that he does not use drugs.  Allergies: No Known Allergies  Current Facility-Administered Medications  Medication Dose Route Frequency Provider Last Rate Last Dose  . acetaminophen (TYLENOL) tablet 650 mg  650 mg Oral Q6H PRN Emokpae, Ejiroghene E, MD       Or  . acetaminophen (TYLENOL) suppository 650 mg  650 mg Rectal Q6H  PRN Emokpae, Ejiroghene E, MD      . cefTRIAXone (ROCEPHIN) 2 g in sodium chloride 0.9 % 100 mL IVPB  2 g Intravenous Q24H Emokpae, Ejiroghene E, MD      . docusate sodium (COLACE) capsule 100 mg  100 mg Oral BID Emokpae, Ejiroghene E, MD      . Derrill Memo ON 06/14/2018] insulin aspart (novoLOG) injection 0-9 Units  0-9 Units Subcutaneous Q4H Emokpae, Ejiroghene E, MD      . Derrill Memo ON 06/14/2018] linaclotide (LINZESS) capsule 145 mcg  145 mcg Oral QAC breakfast Emokpae, Ejiroghene E, MD      . metroNIDAZOLE (FLAGYL) IVPB 500 mg  500 mg Intravenous Q8H Emokpae, Ejiroghene E, MD      . morphine 2 MG/ML injection 2 mg  2 mg Intravenous Q4H PRN Emokpae, Ejiroghene E, MD      . ondansetron (ZOFRAN) tablet 4 mg  4 mg Oral Q6H PRN Emokpae, Ejiroghene E, MD       Or  . ondansetron (ZOFRAN) injection 4 mg  4 mg Intravenous Q6H PRN Emokpae, Ejiroghene E, MD      . simvastatin (ZOCOR) tablet 40 mg  40 mg Oral QPM Emokpae, Ejiroghene E, MD         Results for orders placed or performed during the hospital encounter of 06/13/18 (from the past 48 hour(s))  CBC with Differential     Status: Abnormal   Collection Time: 06/13/18  6:36 PM  Result Value Ref Range   WBC 4.0 4.0 - 10.5 K/uL   RBC 3.69 (L) 4.22 - 5.81 MIL/uL   Hemoglobin 11.7 (L) 13.0 - 17.0 g/dL   HCT 35.3 (L) 39.0 - 52.0 %   MCV 95.7 78.0 - 100.0 fL   MCH 31.7 26.0 - 34.0 pg   MCHC 33.1 30.0 - 36.0 g/dL   RDW 11.9 11.5 - 15.5 %   Platelets 132 (L) 150 - 400 K/uL   Neutrophils Relative % 63 %   Neutro Abs 2.6 1.7 - 7.7 K/uL   Lymphocytes Relative 14 %   Lymphs Abs 0.6 (L) 0.7 - 4.0 K/uL   Monocytes Relative 16 %   Monocytes Absolute 0.6 0.1 - 1.0 K/uL   Eosinophils Relative 6 %   Eosinophils Absolute 0.3 0.0 - 0.7 K/uL   Basophils Relative 1 %   Basophils Absolute 0.0 0.0 - 0.1 K/uL    Comment: Performed at [facility], [address] [place], [place] [address]  Comprehensive metabolic panel     Status: Abnormal   Collection  Time: 06/13/18  6:36 PM  Result Value Ref Range  Sodium 138 135 - 145 mmol/L   Potassium 3.6 3.5 - 5.1 mmol/L   Chloride 102 98 - 111 mmol/L   CO2 28 22 - 32 mmol/L   Glucose, Bld 183 (H) 70 - 99 mg/dL   BUN 15 8 - 23 mg/dL   Creatinine, Ser 1.09 0.61 - 1.24 mg/dL   Calcium 8.5 (L) 8.9 - 10.3 mg/dL   Total Protein 7.1 6.5 - 8.1 g/dL   Albumin 3.0 (L) 3.5 - 5.0 g/dL   AST 47 (H) 15 - 41 U/L   ALT 240 (H) 0 - 44 U/L   Alkaline Phosphatase 101 38 - 126 U/L   Total Bilirubin 3.8 (H) 0.3 - 1.2 mg/dL   GFR calc non Af Amer >60 >60 mL/min   GFR calc Af Amer >60 >60 mL/min    Comment: (NOTE) The eGFR has been calculated using the CKD EPI equation. This calculation has not been validated in all clinical situations. eGFR's persistently <60 mL/min signify possible Chronic Kidney Disease.    Anion gap 8 5 - 15    Comment: Performed at [facility], [address] [place], [place] [address]  Lipase, blood     Status: None   Collection Time: 06/13/18  6:36 PM  Result Value Ref Range   Lipase 32 11 - 51 U/L    Comment: Performed at [facility], [address] [place], [place] [address]    US Abdomen Limited Ruq  Result Date: 06/13/2018 CLINICAL DATA:  Elevated LFTs and bilirubin EXAM: ULTRASOUND ABDOMEN LIMITED RIGHT UPPER QUADRANT COMPARISON:  06/09/2018 FINDINGS: Gallbladder: Thickened gallbladder wall. Gallbladder appears distended. Dependent sludge with dependent tiny calculi 2 mm diameter. Minimal pericholecystic fluid. No sonographic Murphy sign. Acute cholecystitis questioned. Common bile duct: Diameter: 5 mm diameter, normal Liver: Normal appearance. Portal vein is patent on color Doppler imaging with normal direction of blood flow towards the liver. No ascites. IMPRESSION: Sludge and tiny calculi in gallbladder with associated gallbladder wall thickening and minimal pericholecystic fluid. Question of acute cholecystitis is raised. Consider follow-up radionuclide  hepatobiliary imaging if clinically indicated. Electronically Signed   By: Lavonia Dana M.D.   On: 06/13/2018 18:40    Review of Systems  Constitutional: Positive for fever. Negative for chills.  Respiratory: Negative for cough, shortness of breath and wheezing. Hemoptysis: .cmed.   Cardiovascular: Positive for leg swelling. Negative for chest pain.  Gastrointestinal: Positive for abdominal pain and constipation. Negative for blood in stool, diarrhea, nausea and vomiting.  Genitourinary:       Dark urine   Blood pressure 135/75, pulse 70, temperature 98.6 F (37 C), temperature source Oral, resp. rate 18, height '6\' 8"'  (2.032 m), weight (!) 137.9 kg (304 lb), SpO2 99 %. Physical Exam General: Alert, moderately obese African-American male, in no distress Skin: Warm and dry without rash or infection. HEENT: No palpable masses or thyromegaly. Sclera nonicteric. Pupils equal round and reactive.  Lymph nodes: No cervical, supraclavicular,nodes palpable. Lungs: Breath sounds clear and equal without increased work of breathing Cardiovascular: Regular rate and rhythm without murmur.  2+ lower extremity edema with compression hose in place. Peripheral pulses intact. Abdomen: Nondistended.  Well-healed midline incision.  Soft and nontender deep palpation. No masses palpable. No organomegaly. No palpable hernias. Extremities: 2+ bilateral lower extremity edema.  Pression hose in place which I did not remove. Neurologic: Alert and fully oriented.  Affect normal.  No gross motor deficits.  Assessment/Plan: 77 year old male with multiple  medical problems recently presented with abdominal bloating and pressure and constipation.  This was relieved with a bowel movement.  He did have a CT scan showing cholelithiasis and possibly thickened gallbladder wall.  He has had elevated LFTs with transaminases in the 100s and bilirubin 7.  These are now improved.  He is pain-free.  It is not clear that he was ever  symptomatic from his gallbladder.  Ultrasound is of concern for acute cholecystitis.  This could be occult in a elderly diabetic male.  I would recommend proceeding with HIDA to rule out acute cholecystitis.  If this is positive he may need urgent cholecystectomy.  If negative he may well still benefit from cholecystectomy due to likely passed common bile duct stone.  Patient currently covered with antibiotics which I believe is appropriate.  He is being admitted by the hospitalist service and we will follow.  Darene Lamer Fenix Rorke 06/13/2018, 10:44 PM

## 2018-06-13 NOTE — ED Triage Notes (Signed)
Abnormal labs but he does not know what test was abnormal. He thinks he needs to have his gallbladder removed.

## 2018-06-13 NOTE — Plan of Care (Signed)
Care plan initiated.

## 2018-06-13 NOTE — ED Notes (Signed)
Pt. Reports there is no pain in his abd. He came to the ED due to his Dr. Rockey Situ him to come.

## 2018-06-13 NOTE — H&P (Signed)
History and Physical  Jason [name] BJY:[id number] DOB: October 21, 1941 DOA: 06/13/2018   PCP: Jason Lima, MD   Patient coming from: Home with family via [facility] ER   Chief Complaint: abnormal labs   HPI: Jason [name] is a 77 y.o. male with medical history significant for type 2 DM who was recently evaluated in the ED and noted to have constipation, the treatment of which has resolved his abdominal pain. During the workup of his pain, however, he was noted to have cholelithiasis with cholecystitis on abdominal US and have abnormal LFTs as well. He has slightly elevated Bilirubin, and on follow up with his PCP yesterday, while he continues to feel well with no abdominal pain, nausea, bloating or constipation, his bilirubin has actually increased so he was told today to come to the ER for evaluation.    ED Course: In the ER at [facility], he was noted to have elevated bilirubin of 3.8 down from 7. Case with discussed with on call surgery who recommended further workup with HIDA scan since his LFTs have not normalized.   Review of Systems: Please see HPI for pertinent positives and negatives. A complete 10 system review of systems are otherwise negative.  Past Medical History:  Diagnosis Date  . Anxiety   . Asthma    in past, no inhaler now  . Benign neoplasm of colon   . Depression   . Diverticulosis of colon (without mention of hemorrhage)   . DJD (degenerative joint disease)   . Esophageal reflux   . Glaucoma   . Hidradenitis   . History of BPH   . History of gynecomastia    Unilateral  . Hyperlipidemia   . Irritable bowel syndrome   . Memory loss   . MVP (mitral valve prolapse)   . Obesity, unspecified   . Sleep apnea    does not use  cpap or bipap .. no study  ever  . Type II or unspecified type diabetes mellitus without mention of complication, not stated as uncontrolled   . Unspecified venous (peripheral) insufficiency    Past Surgical History:  Procedure  Laterality Date  . AMPUTATION  08/09/2012   Procedure: AMPUTATION DIGIT;  Surgeon: Newt Minion, MD;  Location: [place];  Service: Orthopedics;  Laterality: Right;  Amputation right Great Toe MTP Joint  . AMPUTATION  10/17/2012   Procedure: AMPUTATION DIGIT;  Surgeon: Newt Minion, MD;  Location: [place];  Service: Orthopedics;  Laterality: Right;  Right Foot 3rd Toe Amputation at Metatarsophalangeal Joint  . CIRCUMCISION  06/2100   For Phimosis - Dr Hartley Barefoot  . COLON SURGERY    . KNEE ARTHROSCOPY    . LAPAROTOMY  07/20/11  . LASER ABLATION  06/2009   Left Greater Saphenous vein -Dr Donnetta Hutching  . TOE AMPUTATION  06/2009   x3Right foot second toe -Dr Beola Cord  . TONSILLECTOMY      Social History:  reports that he quit smoking about 2 years ago. His smoking use included cigarettes. He has a 2.50 pack-year smoking history. He has never used smokeless tobacco. He reports that he drinks about 4.2 oz of alcohol per week. He reports that he does not use drugs.   No Known Allergies  Family History  Problem Relation Age of Onset  . Diabetes Father   . Cancer Father        Prostate  . Stroke Father   . Prostate cancer Father   . Colon cancer Neg Hx   .  Esophageal cancer Neg Hx   . Rectal cancer Neg Hx   . Stomach cancer Neg Hx      Prior to Admission medications   Medication Sig Start Date End Date Taking? Authorizing Provider  Ascorbic Acid (VITAMIN C PO) Take 1 tablet by mouth daily.   Yes [provider]  aspirin EC 81 MG tablet Take 1 tablet (81 mg total) by mouth daily. 11/14/14  Yes Jones, Thomas L, MD  glimepiride (AMARYL) 2 MG tablet TAKE 1 TABLET EVERY DAY WITH BREAKFAST 04/06/18  Yes Jones, Thomas L, MD  linaclotide (LINZESS) 145 MCG CAPS capsule Take 1 capsule (145 mcg total) by mouth daily before breakfast. 06/12/18  Yes Jones, Thomas L, MD  metFORMIN (GLUCOPHAGE) 500 MG tablet TAKE 1 TABLET TWICE DAILY WITH MEALS 04/04/18  Yes Jones, Thomas L, MD  simvastatin (ZOCOR) 40 MG tablet  TAKE 1 TABLET EVERY EVENING 04/04/18  Yes Jones, Thomas L, MD  blood glucose meter kit and supplies KIT Use to test blood sugar up to 3 times daily. DX E11.9 02/05/16   Jones, Thomas L, MD  Blood Glucose Monitoring Suppl (TRUE METRIX METER) w/Device KIT Use to check blood sugar DX E11.8 02/13/16   Jones, Thomas L, MD  TRUE METRIX BLOOD GLUCOSE TEST test strip USE  TO TEST BLOOD SUGAR  UP  TO THREE TIMES DAILY 05/10/17   Jones, Thomas L, MD  TRUEPLUS LANCETS 33G MISC USE  TO TEST BLOOD SUGAR  UP  TO THREE TIMES DAILY 06/10/16   Jones, Thomas L, MD    Physical Exam: BP 135/75 (BP Location: Right Arm)   Pulse 70   Temp 98.6 F (37 C) (Oral)   Resp 18   Ht 6' 8" (2.032 m)   Wt (!) 137.9 kg (304 lb)   SpO2 99%   BMI 33.40 kg/m   General:  Alert, oriented, calm, in no acute distress  Eyes: EOMI, clear conjuctivae, white sclerea Neck: supple, no masses, trachea mildline  Cardiovascular: RRR, no murmurs or rubs, no peripheral edema  Respiratory: clear to auscultation bilaterally, no wheezes, no crackles  Abdomen: soft, nontender, nondistended, normal bowel tones heard  Skin: dry, no rashes  Musculoskeletal: no joint effusions, normal range of motion  Psychiatric: appropriate affect, normal speech  Neurologic: extraocular muscles intact, clear speech, moving all extremities with intact sensorium            Labs on Admission:  Basic Metabolic Panel: Recent Labs  Lab 06/09/18 1313 06/12/18 1508 06/13/18 1836  NA 142 138 138  K 3.9 4.0 3.6  CL 101 100 102  CO2 31 30 28  GLUCOSE 274* 181* 183*  BUN 13 19 15  CREATININE 0.87 1.37 1.09  CALCIUM 9.1 9.2 8.5*   Liver Function Tests: Recent Labs  Lab 06/09/18 1313 06/12/18 1508 06/13/18 1836  AST 983* 73* 47*  ALT 541* 353* 240*  ALKPHOS 92 111 101  BILITOT 2.5* 7.3* 3.8*  PROT 7.6 7.2 7.1  ALBUMIN 4.0 3.8 3.0*   Recent Labs  Lab 06/09/18 1313 06/13/18 1836  LIPASE 25 32   No results for input(s): AMMONIA in the last 168  hours. CBC: Recent Labs  Lab 06/09/18 1313 06/12/18 1508 06/13/18 1836  WBC 10.6* 5.5 4.0  NEUTROABS  --  4.2 2.6  HGB 14.0 12.3* 11.7*  HCT 41.8 36.3* 35.3*  MCV 95.4 95.4 95.7  PLT 144* 122.0* 132*   Cardiac Enzymes: No results for input(s): CKTOTAL, CKMB, CKMBINDEX, TROPONINI in the   last 168 hours.  BNP (last 3 results) No results for input(s): BNP in the last 8760 hours.  ProBNP (last 3 results) No results for input(s): PROBNP in the last 8760 hours.  CBG: No results for input(s): GLUCAP in the last 168 hours.  Radiological Exams on Admission: Us Abdomen Limited Ruq  Result Date: 06/13/2018 CLINICAL DATA:  Elevated LFTs and bilirubin EXAM: ULTRASOUND ABDOMEN LIMITED RIGHT UPPER QUADRANT COMPARISON:  06/09/2018 FINDINGS: Gallbladder: Thickened gallbladder wall. Gallbladder appears distended. Dependent sludge with dependent tiny calculi 2 mm diameter. Minimal pericholecystic fluid. No sonographic Murphy sign. Acute cholecystitis questioned. Common bile duct: Diameter: 5 mm diameter, normal Liver: Normal appearance. Portal vein is patent on color Doppler imaging with normal direction of blood flow towards the liver. No ascites. IMPRESSION: Sludge and tiny calculi in gallbladder with associated gallbladder wall thickening and minimal pericholecystic fluid. Question of acute cholecystitis is raised. Consider follow-up radionuclide hepatobiliary imaging if clinically indicated. Electronically Signed   By: Mark  Boles M.D.   On: 06/13/2018 18:40   Assessment/Plan Present on Admission: . Cholelithiasis . Acute cholecystitis . Diabetes mellitus type 2 with atherosclerosis of arteries of extremities (HCC) . GERD . Benign prostatic hyperplasia . PAD (peripheral artery disease) (HCC) . Chronic idiopathic constipation  Pleasant 77 with year old AA male with history of DM and chronic constipation admitted for definitive workup and treatment of elevated bilirubin related to  cholelithiasis and suspected cholecystitis. - observation admission - HIDA scan in AM - pain and nausea control PRN - has been seen in consultation by general surgery, with recommendations pending   Diabetes mellitus type 2 with atherosclerosis of arteries of extremities (HCC) - check A1c - diabetic diet when eating - SSI  Elevated LFTs - likely due to cholelithiasis and possible intermittent obstruction.  - check acute hepatits panel - trend LFTs   GERD - PO PPI   Benign prostatic hyperplasia   PAD (peripheral artery disease) (HCC)   Chronic idiopathic constipation   Cholelithiasis   Acute cholecystitis  DVT prophylaxis: Lovenox   Code Status: FULL   Family Communication: Multiple family members at bedside this evening.   Disposition Plan: Home at DC.   Consults called: Surgery   Admission status: Observation   Time spent: 39 minutes  Mir Mohammed Ikramullah MD [facility] Pager [phone]  If 7PM-7AM, please contact night-coverage [email or web address] Password [email or web address]  06/13/2018, 10:45 PM   

## 2018-06-13 NOTE — Telephone Encounter (Signed)
Jason [name] with [facility] called and stated that Dr. Malachi Paradise reviewed the labs for patient (ED labs and recent labs with PCP) and stated that the bilirubin count has increased significantly and pt should go to ED.  I contacted pt and informed of same. Pt stated understanding and stated that he would go as soon as he could.

## 2018-06-13 NOTE — H&P (Deleted)
History and Physical    Jason [name] FHQ:[id number] DOB: June 11, 1941 DOA: 06/13/2018  PCP: [name], MD   Patient coming from: Home   Chief Complaint: Abdominal Pain, abnormal labs  HPI: Jason [name] is a 77 y.o. male with medical history significant for BPH, DM, OSA, who resented to the [facility] ED as recommended by general surgery with abnormal labs.  Patient was initially in the ED- 06/09/18, with elevated liver enzymes, right upper quadrant ultrasound concerning for early acute cholecystitis.  Abdominal pain improved, general surgery was consulted recommended patient can be discharged home as abdominal pain has improved, follow-up as outpatient. Patient was discharged to follow-up with his PCP, who he saw earlier today, blood work drawn showed improved AST ALT both increased bilirubin from 2.5 >7.3.  Patient was told to follow-up with general surgery. Dr. Malachi Paradise, general surgeon with [facility] reviewed the labs and recommended that patient be sent to the ED as his bilirubin had increased. Today patient reports abdominal pain is improved, endorses nausea without vomiting.  No fever or chills. last bowel movement was this morning, more consistency.  Reports he has been constipated for several days.   ED Course: Stable vitals.  Liver enzymes show improved ALT and AST, bilirubin repeated in ED now 3.8.  Normal ALP.  RUQ ultrasound-sludge and tiny calculi in gallbladder with associated gallbladder wall thickening and minimal pericholecystic fluid, question acute cystitis.  HIDA scan recommended.  Review of Systems: As per HPI otherwise 10 point review of systems negative.  Past Medical History:  Diagnosis Date  . Anxiety   . Asthma    in past, no inhaler now  . Benign neoplasm of colon   . Depression   . Diverticulosis of colon (without mention of hemorrhage)   . DJD (degenerative joint disease)   . Esophageal reflux   . Glaucoma   . Hidradenitis   . History of BPH   .  History of gynecomastia    Unilateral  . Hyperlipidemia   . Irritable bowel syndrome   . Memory loss   . MVP (mitral valve prolapse)   . Obesity, unspecified   . Sleep apnea    does not use  cpap or bipap .. no study  ever  . Type II or unspecified type diabetes mellitus without mention of complication, not stated as uncontrolled   . Unspecified venous (peripheral) insufficiency     Past Surgical History:  Procedure Laterality Date  . AMPUTATION  08/09/2012   Procedure: AMPUTATION DIGIT;  Surgeon: Newt Minion, MD;  Location: [place];  Service: Orthopedics;  Laterality: Right;  Amputation right Great Toe MTP Joint  . AMPUTATION  10/17/2012   Procedure: AMPUTATION DIGIT;  Surgeon: Newt Minion, MD;  Location: [place];  Service: Orthopedics;  Laterality: Right;  Right Foot 3rd Toe Amputation at Metatarsophalangeal Joint  . CIRCUMCISION  06/2100   For Phimosis - Dr Hartley Barefoot  . COLON SURGERY    . KNEE ARTHROSCOPY    . LAPAROTOMY  07/20/11  . LASER ABLATION  06/2009   Left Greater Saphenous vein -Dr Donnetta Hutching  . TOE AMPUTATION  06/2009   x3Right foot second toe -Dr Beola Cord  . TONSILLECTOMY       reports that he quit smoking about 2 years ago. His smoking use included cigarettes. He has a 2.50 pack-year smoking history. He has never used smokeless tobacco. He reports that he drinks about 4.2 oz of alcohol per week. He reports that he does  not use drugs.  No Known Allergies  Family History  Problem Relation Age of Onset  . Diabetes Father   . Cancer Father        Prostate  . Stroke Father   . Prostate cancer Father   . Colon cancer Neg Hx   . Esophageal cancer Neg Hx   . Rectal cancer Neg Hx   . Stomach cancer Neg Hx      Prior to Admission medications   Medication Sig Start Date End Date Taking? Authorizing Provider  Ascorbic Acid (VITAMIN C PO) Take 1 tablet by mouth daily.   Yes [provider]  aspirin EC 81 MG tablet Take 1 tablet (81 mg total) by mouth daily. 11/14/14   Yes [name], MD  glimepiride (AMARYL) 2 MG tablet TAKE 1 TABLET EVERY DAY WITH BREAKFAST 04/06/18  Yes [name], MD  linaclotide [facility]) 145 MCG CAPS capsule Take 1 capsule (145 mcg total) by mouth daily before breakfast. 06/12/18  Yes [name], MD  metFORMIN (GLUCOPHAGE) 500 MG tablet TAKE 1 TABLET TWICE DAILY WITH MEALS 04/04/18  Yes [name], MD  simvastatin (ZOCOR) 40 MG tablet TAKE 1 TABLET EVERY EVENING 04/04/18  Yes [name], MD  blood glucose meter kit and supplies KIT Use to test blood sugar up to 3 times daily. DX E11.9 02/05/16   [name], MD  Blood Glucose Monitoring Suppl (TRUE METRIX METER) w/Device KIT Use to check blood sugar DX E11.8 02/13/16   [name], MD  TRUE METRIX BLOOD GLUCOSE TEST test strip USE  TO TEST BLOOD SUGAR  UP  TO THREE TIMES DAILY 05/10/17   [name], MD  TRUEPLUS LANCETS 33G MISC USE  TO TEST BLOOD SUGAR  UP  TO THREE TIMES DAILY 06/10/16   [name], MD    Physical Exam: Vitals:   06/13/18 1723 06/13/18 1725 06/13/18 1941  BP:  137/73 (!) 141/78  Pulse:  68 70  Resp:  20 18  Temp:  98.3 F (36.8 C)   TempSrc:  Oral   SpO2:  99% 98%  Weight: (!) 137.9 kg (304 lb)    Height: '6\' 8"'$  (2.032 m)      Constitutional: NAD, calm, comfortable Vitals:   06/13/18 1723 06/13/18 1725 06/13/18 1941  BP:  137/73 (!) 141/78  Pulse:  68 70  Resp:  20 18  Temp:  98.3 F (36.8 C)   TempSrc:  Oral   SpO2:  99% 98%  Weight: (!) 137.9 kg (304 lb)    Height: '6\' 8"'$  (2.032 m)     Eyes: PERRL, lids and conjunctivae normal ENMT: Mucous membranes are moist. Posterior pharynx clear of any exudate or lesions. Neck: normal, supple, no masses, no thyromegaly Respiratory: clear to auscultation bilaterally, no wheezing, no crackles. Normal respiratory effort. No accessory muscle use.  Cardiovascular: Regular rate and rhythm, no murmurs / rubs / gallops.  Trace pitting pedal edema worse on right-chronic. 2+ pedal  pulses.  Abdomen: no tenderness, no masses palpated. No hepatosplenomegaly. Bowel sounds positive.  Musculoskeletal: no clubbing / cyanosis. No joint deformity upper and lower extremities. Good ROM, no contractures. Normal muscle tone.  Skin: no rashes, lesions, ulcers. No induration Neurologic: CN 2-12 grossly intact.  Strength 5/5 in all 4.  Psychiatric: Normal judgment and insight. Alert and oriented x 3. Normal mood.   Labs on Admission: I have personally reviewed following labs and imaging studies  CBC: Recent  Labs  Lab 06/09/18 1313 06/12/18 1508 06/13/18 1836  WBC 10.6* 5.5 4.0  NEUTROABS  --  4.2 2.6  HGB 14.0 12.3* 11.7*  HCT 41.8 36.3* 35.3*  MCV 95.4 95.4 95.7  PLT 144* 122.0* 834*   Basic Metabolic Panel: Recent Labs  Lab 06/09/18 1313 06/12/18 1508 06/13/18 1836  NA 142 138 138  K 3.9 4.0 3.6  CL 101 100 102  CO2 _0 GLUCOSE 274* 181* 183*  BUN _1 CREATININE 0.87 1.37 1.09  CALCIUM 9.1 9.2 8.5*   GFR: Estimated Creatinine Clearance: 92 mL/min (by C-G formula based on SCr of 1.09 mg/dL). Liver Function Tests: Recent Labs  Lab 06/09/18 1313 06/12/18 1508 06/13/18 1836  AST 983* 73* 47*  ALT 541* 353* 240*  ALKPHOS 92 111 101  BILITOT 2.5* 7.3* 3.8*  PROT 7.6 7.2 7.1  ALBUMIN 4.0 3.8 3.0*   Recent Labs  Lab 06/09/18 1313 06/13/18 1836  LIPASE 25 32   HbA1C: Recent Labs    06/12/18 1508  HGBA1C 7.4*   Thyroid Function Tests: Recent Labs    06/12/18 1508  TSH 1.66   Urine analysis:    Component Value Date/Time   COLORURINE YELLOW 02/07/2018 1026   APPEARANCEUR CLEAR 02/07/2018 1026   LABSPEC 1.025 02/07/2018 1026   PHURINE 6.0 02/07/2018 1026   GLUCOSEU NEGATIVE 02/07/2018 1026   HGBUR NEGATIVE 02/07/2018 1026   BILIRUBINUR neg 03/21/2018 0940   KETONESUR NEGATIVE 02/07/2018 1026   PROTEINUR neg 03/21/2018 0940   PROTEINUR NEGATIVE 12/19/2015 1206   UROBILINOGEN 0.2 03/21/2018 0940   UROBILINOGEN 2.0 (A)  02/07/2018 1026   NITRITE neg 03/21/2018 0940   NITRITE NEGATIVE 02/07/2018 1026   LEUKOCYTESUR Negative 03/21/2018 0940    Radiological Exams on Admission: US Abdomen Limited Ruq  Result Date: 06/13/2018 CLINICAL DATA:  Elevated LFTs and bilirubin EXAM: ULTRASOUND ABDOMEN LIMITED RIGHT UPPER QUADRANT COMPARISON:  06/09/2018 FINDINGS: Gallbladder: Thickened gallbladder wall. Gallbladder appears distended. Dependent sludge with dependent tiny calculi 2 mm diameter. Minimal pericholecystic fluid. No sonographic Murphy sign. Acute cholecystitis questioned. Common bile duct: Diameter: 5 mm diameter, normal Liver: Normal appearance. Portal vein is patent on color Doppler imaging with normal direction of blood flow towards the liver. No ascites. IMPRESSION: Sludge and tiny calculi in gallbladder with associated gallbladder wall thickening and minimal pericholecystic fluid. Question of acute cholecystitis is raised. Consider follow-up radionuclide hepatobiliary imaging if clinically indicated. Electronically Signed   By: Lavonia Dana M.D.   On: 06/13/2018 18:40    EKG: None   Assessment/Plan Active Problems:   Diabetes mellitus type 2 with atherosclerosis of arteries of extremities (HCC)   GERD   Benign prostatic hyperplasia   PAD (peripheral artery disease) (HCC)   Chronic idiopathic constipation   Cholelithiasis   Acute cholecystitis   Acute cholecystitis- per RUQ [place], HIDA scan recommended.  Liver enzymes now improving evaluated by general surgeon Dr. Excell Seltzer in-patient. -General surgery recs appreciated-recommends HIDA rule out acute cholecystitis, if positive may need urgent cholecystectomy, if negative may still benefit from cholecystectomy due to likely passed common bile duct stone, continue antibiotics. -N.p.o. Midnight -HIDA scan ordered - Will Start antibiotics IV ceftriaxone and IV metronidazole -Zofran - CMP a.m  DM- 06/12/18- Hemoglobin A1c- 7.4.  - SSI -Hold home metformin,  glimeperide  OSA- CPAP.  DVT prophylaxis: Scds for now, pending surgery eval Code Status: Full Family Communication: None at bedside Disposition Plan: per rounding Consults called: Gen Surg Admission status: Inpt,  obs   Bethena Roys MD [facility] Pager [phone] From 6PM-2AM.  Otherwise please contact night-coverage [email or web address] Password [email or web address]  06/13/2018, 10:12 PM

## 2018-06-13 NOTE — Progress Notes (Signed)
Report received from [facility] RN/[facility], pt arrived via [facility] via stretcher and walked to bed without difficulty, oriented to room and call bell and instructed to call for assist as needed, pt verbalized understanding.

## 2018-06-13 NOTE — ED Provider Notes (Signed)
Emergency Department Provider Note   I have reviewed the triage vital signs and the nursing notes.   HISTORY  Chief Complaint Abnormal Lab   HPI Jason [name] is a 77 y.o. male who was seen in the emergency room 4 days ago for constipation and overall abdominal pain does seem to have improved.  He was found to have gallstones, wall thickening concerning for possible acute cholecystitis however to having abdominal pain it sounds like surgery was consulted over the phone and stated his pain was improved he could go home and follow-up with his primary doctor and/or surgery for further management.  At that time his bilirubin was 2 something and he had elevated liver enzymes as well.  Patient had labs drawn yesterday with his primary doctor.  These show a bilirubin of 7.8 with improved AST and ALT.  Patient states his constipation is improved and has no abdominal pain this time.  No nausea or vomiting, diarrhea or constipation.  Significant other does think that his eyes look more yellow than normal.  His primary doctor called surgery, Dr. Brantley Stage, who reviewed the labs and advised to come back to the emergency room. No other associated or modifying symptoms.    Past Medical History:  Diagnosis Date  . Anxiety   . Asthma    in past, no inhaler now  . Benign neoplasm of colon   . Depression   . Diverticulosis of colon (without mention of hemorrhage)   . DJD (degenerative joint disease)   . Esophageal reflux   . Glaucoma   . Hidradenitis   . History of BPH   . History of gynecomastia    Unilateral  . Hyperlipidemia   . Irritable bowel syndrome   . Memory loss   . MVP (mitral valve prolapse)   . Obesity, unspecified   . Sleep apnea    does not use  cpap or bipap .. no study  ever  . Type II or unspecified type diabetes mellitus without mention of complication, not stated as uncontrolled   . Unspecified venous (peripheral) insufficiency     Patient Active Problem List   Diagnosis Date Noted  . Cholelithiasis 06/13/2018  . Acute cholecystitis 06/13/2018  . Gallstones and inflammation of gallbladder without obstruction 06/12/2018  . Chronic idiopathic constipation 06/12/2018  . Diabetic infection of right foot ([place]) 07/12/2017  . Foot ulcer, right, with unspecified severity ([place]) 01/17/2017  . Complex sleep apnea syndrome 01/12/2016  . Ex-smoker 01/01/2016  . Diabetes mellitus type 2, uncontrolled, with complications ([place]) 00/92/3300  . OSA (obstructive sleep apnea) 12/18/2015  . Benign prostatic hyperplasia 02/18/2014  . Routine general medical examination at a health care facility 02/18/2014  . PAD (peripheral artery disease) ([place]) 02/18/2014  . DEGENERATIVE JOINT DISEASE 02/20/2008  . Diabetes mellitus type 2 with atherosclerosis of arteries of extremities ([place]) 01/26/2008  . Hyperlipidemia with target LDL less than 70 01/26/2008  . Obesity 01/26/2008  . Mitral valve disorder 01/26/2008  . GERD 01/26/2008  . Irritable bowel syndrome 01/26/2008    Past Surgical History:  Procedure Laterality Date  . AMPUTATION  08/09/2012   Procedure: AMPUTATION DIGIT;  Surgeon: Newt Minion, MD;  Location: [facility];  Service: Orthopedics;  Laterality: Right;  Amputation right Great Toe MTP Joint  . AMPUTATION  10/17/2012   Procedure: AMPUTATION DIGIT;  Surgeon: Newt Minion, MD;  Location: [facility];  Service: Orthopedics;  Laterality: Right;  Right Foot 3rd Toe Amputation at Metatarsophalangeal Joint  .  CIRCUMCISION  06/2100   For Phimosis - Dr Hartley Barefoot  . COLON SURGERY    . KNEE ARTHROSCOPY    . LAPAROTOMY  07/20/11  . LASER ABLATION  06/2009   Left Greater Saphenous vein -Dr Donnetta Hutching  . TOE AMPUTATION  06/2009   x3Right foot second toe -Dr Beola Cord  . TONSILLECTOMY        Allergies Patient has no known allergies.  Family History  Problem Relation Age of Onset  . Diabetes Father   . Cancer Father        Prostate  . Stroke Father   . Prostate cancer Father     . Colon cancer Neg Hx   . Esophageal cancer Neg Hx   . Rectal cancer Neg Hx   . Stomach cancer Neg Hx     Social History Social History   Tobacco Use  . Smoking status: Former Smoker    Packs/day: 0.50    Years: 5.00    Pack years: 2.50    Types: Cigarettes    Last attempt to quit: 09/05/2015    Years since quitting: 2.7  . Smokeless tobacco: Never Used  . Tobacco comment: He quit in 1973.  Smoked for one year between 2015 and 2015  Substance Use Topics  . Alcohol use: Yes    Alcohol/week: 4.2 oz    Types: 7 Cans of beer per week    Comment: not every week   . Drug use: No    Review of Systems  All other systems negative except as documented in the HPI. All pertinent positives and negatives as reviewed in the HPI. ____________________________________________   PHYSICAL EXAM:  VITAL SIGNS: ED Triage Vitals  Enc Vitals Group     BP 06/13/18 1725 137/73     Pulse Rate 06/13/18 1725 68     Resp 06/13/18 1725 20     Temp 06/13/18 1725 98.3 F (36.8 C)     Temp Source 06/13/18 1725 Oral     SpO2 06/13/18 1725 99 %     Weight 06/13/18 1723 (!) 304 lb (137.9 kg)     Height 06/13/18 1723 6\' 8"  (2.032 m)    Constitutional: Alert and oriented. Well appearing and in no acute distress. Eyes: Conjunctivae are normal. PERRL. EOMI. Head: Atraumatic. Nose: No congestion/rhinnorhea. Mouth/Throat: Mucous membranes are moist.  Oropharynx non-erythematous. Neck: No stridor.  No meningeal signs.   Cardiovascular: Normal rate, regular rhythm. Good peripheral circulation. Grossly normal heart sounds.   Respiratory: Normal respiratory effort.  No retractions. Lungs CTAB. Gastrointestinal: Soft and nontender. No guarding, rigidity, peritonitis. Normal bowel sounds. No distention.  Musculoskeletal: No lower extremity tenderness nor edema. No gross deformities of extremities. Neurologic:  Normal speech and language. No gross focal neurologic deficits are appreciated.  Skin:  Skin is  warm, dry and intact. No rash noted.   ____________________________________________   LABS (all labs ordered are listed, but only abnormal results are displayed)  Labs Reviewed  CBC WITH DIFFERENTIAL/PLATELET - Abnormal; Notable for the following components:      Result Value   RBC 3.69 (*)    Hemoglobin 11.7 (*)    HCT 35.3 (*)    Platelets 132 (*)    Lymphs Abs 0.6 (*)    All other components within normal limits  COMPREHENSIVE METABOLIC PANEL - Abnormal; Notable for the following components:   Glucose, Bld 183 (*)    Calcium 8.5 (*)    Albumin 3.0 (*)    AST 47 (*)  ALT 240 (*)    Total Bilirubin 3.8 (*)    All other components within normal limits  LIPASE, BLOOD  HEMOGLOBIN A1C  COMPREHENSIVE METABOLIC PANEL  CBC  HEPATITIS PANEL, ACUTE   ____________________________________________  RADIOLOGY  US Abdomen Limited Ruq  Result Date: 06/13/2018 CLINICAL DATA:  Elevated LFTs and bilirubin EXAM: ULTRASOUND ABDOMEN LIMITED RIGHT UPPER QUADRANT COMPARISON:  06/09/2018 FINDINGS: Gallbladder: Thickened gallbladder wall. Gallbladder appears distended. Dependent sludge with dependent tiny calculi 2 mm diameter. Minimal pericholecystic fluid. No sonographic Murphy sign. Acute cholecystitis questioned. Common bile duct: Diameter: 5 mm diameter, normal Liver: Normal appearance. Portal vein is patent on color Doppler imaging with normal direction of blood flow towards the liver. No ascites. IMPRESSION: Sludge and tiny calculi in gallbladder with associated gallbladder wall thickening and minimal pericholecystic fluid. Question of acute cholecystitis is raised. Consider follow-up radionuclide hepatobiliary imaging if clinically indicated. Electronically Signed   By: Lavonia Dana M.D.   On: 06/13/2018 18:40    ____________________________________________   PROCEDURES  Procedure(s) performed:   Procedures   ____________________________________________   INITIAL IMPRESSION /  ASSESSMENT AND PLAN / ED COURSE  Absolutely benign abdomen.  We will recheck labs and ultrasound and consult surgery for their recommendations on further management.  Work-up as above.  Patient still pain-free.  Discussed with surgery recommends medicine admission with HIDA scan and surgery consultation.  Dr. Excell Seltzer will see the patient at [facility] long.  Discussed with the patient stable for transfer to [facility] long for admission.     Pertinent labs & imaging results that were available during my care of the patient were reviewed by me and considered in my medical decision making (see chart for details).  ____________________________________________  FINAL CLINICAL IMPRESSION(S) / ED DIAGNOSES  Final diagnoses:  Elevated bilirubin     MEDICATIONS GIVEN DURING THIS VISIT:  Medications  linaclotide (LINZESS) capsule 145 mcg (has no administration in time range)  simvastatin (ZOCOR) tablet 40 mg (has no administration in time range)  acetaminophen (TYLENOL) tablet 650 mg (has no administration in time range)    Or  acetaminophen (TYLENOL) suppository 650 mg (has no administration in time range)  docusate sodium (COLACE) capsule 100 mg (has no administration in time range)  ondansetron (ZOFRAN) tablet 4 mg (has no administration in time range)    Or  ondansetron (ZOFRAN) injection 4 mg (has no administration in time range)  morphine 2 MG/ML injection 2 mg (has no administration in time range)  insulin aspart (novoLOG) injection 0-9 Units (has no administration in time range)  cefTRIAXone (ROCEPHIN) 2 g in sodium chloride 0.9 % 100 mL IVPB (has no administration in time range)  metroNIDAZOLE (FLAGYL) IVPB 500 mg (has no administration in time range)     NEW OUTPATIENT MEDICATIONS STARTED DURING THIS VISIT:  Current Discharge Medication List      Note:  This note was prepared with assistance of Dragon voice recognition software. Occasional wrong-word or sound-a-like  substitutions may have occurred due to the inherent limitations of voice recognition software.  Merrily Pew, MD 06/13/18 [id number]

## 2018-06-13 NOTE — Progress Notes (Signed)
Lovenox per Pharmacy for DVT Prophylaxis    Pharmacy has been consulted from dosing enoxaparin (lovenox) in this patient for DVT prophylaxis.  The pharmacist has reviewed pertinent labs (Hgb _11.7__; PLT_35.3__), patient weight (_138__kg) and renal function (CrCl_>90__mL/min) and decided that enoxaparin _60_mg SQ Q24Hrs is appropriate for this patient.  The pharmacy department will sign off at this time.  Please reconsult pharmacy if status changes or for further issues.  Thank you  Cyndia Diver PharmD, BCPS  06/13/2018, 10:04 PM

## 2018-06-14 ENCOUNTER — Inpatient Hospital Stay (HOSPITAL_COMMUNITY): Payer: Medicare HMO | Admitting: Anesthesiology

## 2018-06-14 ENCOUNTER — Inpatient Hospital Stay (HOSPITAL_COMMUNITY): Payer: Medicare HMO

## 2018-06-14 ENCOUNTER — Other Ambulatory Visit: Payer: Self-pay

## 2018-06-14 ENCOUNTER — Encounter (HOSPITAL_COMMUNITY)
Admission: EM | Disposition: A | Discharge status: Home / Self Care | Payer: Self-pay | Source: Home / Self Care | Attending: Internal Medicine

## 2018-06-14 ENCOUNTER — Encounter (HOSPITAL_COMMUNITY): Payer: Self-pay | Admitting: Certified Registered"

## 2018-06-14 DIAGNOSIS — K8050 Calculus of bile duct without cholangitis or cholecystitis without obstruction: Secondary | ICD-10-CM

## 2018-06-14 HISTORY — PX: LAPAROSCOPIC LYSIS OF ADHESIONS: SHX5905

## 2018-06-14 HISTORY — PX: CHOLECYSTECTOMY: SHX55

## 2018-06-14 LAB — COMPREHENSIVE METABOLIC PANEL
ALK PHOS: 94 U/L (ref 38–126)
ALT: 198 U/L — ABNORMAL HIGH (ref 0–44)
AST: 38 U/L (ref 15–41)
Albumin: 2.9 g/dL — ABNORMAL LOW (ref 3.5–5.0)
Anion gap: 8 (ref 5–15)
BUN: 15 mg/dL (ref 8–23)
CALCIUM: 8.6 mg/dL — AB (ref 8.9–10.3)
CO2: 30 mmol/L (ref 22–32)
CREATININE: 1.02 mg/dL (ref 0.61–1.24)
Chloride: 104 mmol/L (ref 98–111)
GFR calc non Af Amer: >60 mL/min (ref >60)
Glucose, Bld: 176 mg/dL — ABNORMAL HIGH (ref 70–99)
Potassium: 3.8 mmol/L (ref 3.5–5.1)
Sodium: 142 mmol/L (ref 135–145)
Total Bilirubin: 3.3 mg/dL — ABNORMAL HIGH (ref 0.3–1.2)
Total Protein: 6.6 g/dL (ref 6.5–8.1)

## 2018-06-14 LAB — CBC
HCT: 33.7 % — ABNORMAL LOW (ref 39.0–52.0)
Hemoglobin: 11.2 g/dL — ABNORMAL LOW (ref 13.0–17.0)
MCH: 32 pg (ref 26.0–34.0)
MCHC: 33.2 g/dL (ref 30.0–36.0)
MCV: 96.3 fL (ref 78.0–100.0)
PLATELETS: 146 10*3/uL — AB (ref 150–400)
RBC: 3.5 MIL/uL — ABNORMAL LOW (ref 4.22–5.81)
RDW: 12.4 % (ref 11.5–15.5)
WBC: 3.8 10*3/uL — AB (ref 4.0–10.5)

## 2018-06-14 LAB — GLUCOSE, CAPILLARY
GLUCOSE-CAPILLARY: 116 mg/dL — AB (ref 70–99)
GLUCOSE-CAPILLARY: 164 mg/dL — AB (ref 70–99)
GLUCOSE-CAPILLARY: 266 mg/dL — AB (ref 70–99)
GLUCOSE-CAPILLARY: 408 mg/dL — AB (ref 70–99)
Glucose-Capillary: 146 mg/dL — ABNORMAL HIGH (ref 70–99)
Glucose-Capillary: 262 mg/dL — ABNORMAL HIGH (ref 70–99)

## 2018-06-14 LAB — GLUCOSE, RANDOM: GLUCOSE: 366 mg/dL — AB (ref 70–99)

## 2018-06-14 SURGERY — LAPAROSCOPIC CHOLECYSTECTOMY WITH INTRAOPERATIVE CHOLANGIOGRAM
Anesthesia: General | Site: Abdomen

## 2018-06-14 MED ORDER — LACTATED RINGERS IR SOLN
Status: DC | PRN
  Administered 2018-06-14: 1000 mL

## 2018-06-14 MED ORDER — ONDANSETRON HCL 4 MG/2ML IJ SOLN
INTRAMUSCULAR | Status: DC | PRN
  Administered 2018-06-14: 4 mg via INTRAVENOUS

## 2018-06-14 MED ORDER — BUPIVACAINE-EPINEPHRINE (PF) 0.25% -1:200000 IJ SOLN
INTRAMUSCULAR | Status: AC
  Filled 2018-06-14: qty 30

## 2018-06-14 MED ORDER — IOPAMIDOL (ISOVUE-300) INJECTION 61%
INTRAVENOUS | Status: DC | PRN
  Administered 2018-06-14: 4 mL via INTRAVENOUS

## 2018-06-14 MED ORDER — LIDOCAINE 2% (20 MG/ML) 5 ML SYRINGE
INTRAMUSCULAR | Status: AC
  Filled 2018-06-14: qty 5

## 2018-06-14 MED ORDER — SUGAMMADEX SODIUM 500 MG/5ML IV SOLN
INTRAVENOUS | Status: DC | PRN
  Administered 2018-06-14: 300 mg via INTRAVENOUS

## 2018-06-14 MED ORDER — IOPAMIDOL (ISOVUE-300) INJECTION 61%
INTRAVENOUS | Status: AC
  Filled 2018-06-14: qty 50

## 2018-06-14 MED ORDER — BUPIVACAINE-EPINEPHRINE 0.25% -1:200000 IJ SOLN
INTRAMUSCULAR | Status: DC | PRN
  Administered 2018-06-14: 30 mL

## 2018-06-14 MED ORDER — ROCURONIUM BROMIDE 10 MG/ML (PF) SYRINGE
PREFILLED_SYRINGE | INTRAVENOUS | Status: AC
  Filled 2018-06-14: qty 10

## 2018-06-14 MED ORDER — PROPOFOL 10 MG/ML IV BOLUS
INTRAVENOUS | Status: AC
  Filled 2018-06-14: qty 20

## 2018-06-14 MED ORDER — FENTANYL CITRATE (PF) 100 MCG/2ML IJ SOLN
INTRAMUSCULAR | Status: DC | PRN
  Administered 2018-06-14: 50 ug via INTRAVENOUS
  Administered 2018-06-14: 100 ug via INTRAVENOUS
  Administered 2018-06-14 (×2): 50 ug via INTRAVENOUS

## 2018-06-14 MED ORDER — FENTANYL CITRATE (PF) 100 MCG/2ML IJ SOLN
25.0000 ug | INTRAMUSCULAR | Status: DC | PRN
  Administered 2018-06-14: 50 ug via INTRAVENOUS

## 2018-06-14 MED ORDER — PROPOFOL 10 MG/ML IV BOLUS
INTRAVENOUS | Status: DC | PRN
  Administered 2018-06-14: 200 mg via INTRAVENOUS

## 2018-06-14 MED ORDER — ONDANSETRON HCL 4 MG/2ML IJ SOLN: 4.0000 mg | Freq: Once | INTRAMUSCULAR | Status: DC | PRN

## 2018-06-14 MED ORDER — DEXAMETHASONE SODIUM PHOSPHATE 10 MG/ML IJ SOLN
INTRAMUSCULAR | Status: DC | PRN
  Administered 2018-06-14: 10 mg via INTRAVENOUS

## 2018-06-14 MED ORDER — SUGAMMADEX SODIUM 500 MG/5ML IV SOLN
INTRAVENOUS | Status: AC
  Filled 2018-06-14: qty 5

## 2018-06-14 MED ORDER — HYDROCODONE-ACETAMINOPHEN 5-325 MG PO TABS
1.0000 | ORAL_TABLET | ORAL | Status: DC | PRN
  Administered 2018-06-14 – 2018-06-15 (×3): 1 via ORAL
  Filled 2018-06-14 (×3): qty 1

## 2018-06-14 MED ORDER — LACTATED RINGERS IV SOLN
INTRAVENOUS | Status: DC
  Administered 2018-06-14 (×2): via INTRAVENOUS

## 2018-06-14 MED ORDER — SODIUM CHLORIDE 0.9 % IV SOLN
INTRAVENOUS | Status: DC | PRN
  Administered 2018-06-14: 500 mL via INTRAVENOUS

## 2018-06-14 MED ORDER — FENTANYL CITRATE (PF) 100 MCG/2ML IJ SOLN
INTRAMUSCULAR | Status: AC
  Filled 2018-06-14: qty 2

## 2018-06-14 MED ORDER — FENTANYL CITRATE (PF) 250 MCG/5ML IJ SOLN
INTRAMUSCULAR | Status: AC
  Filled 2018-06-14: qty 5

## 2018-06-14 MED ORDER — 0.9 % SODIUM CHLORIDE (POUR BTL) OPTIME
TOPICAL | Status: DC | PRN
  Administered 2018-06-14: 1000 mL

## 2018-06-14 MED ORDER — LIDOCAINE 2% (20 MG/ML) 5 ML SYRINGE
INTRAMUSCULAR | Status: DC | PRN
  Administered 2018-06-14: 100 mg via INTRAVENOUS

## 2018-06-14 MED ORDER — ROCURONIUM BROMIDE 10 MG/ML (PF) SYRINGE
PREFILLED_SYRINGE | INTRAVENOUS | Status: DC | PRN
  Administered 2018-06-14: 60 mg via INTRAVENOUS

## 2018-06-14 SURGICAL SUPPLY — 43 items
APPLIER CLIP ROT 10 11.4 M/L (STAPLE) ×4
BANDAGE ADH SHEER 1  50/CT (GAUZE/BANDAGES/DRESSINGS) ×16 IMPLANT
BENZOIN TINCTURE PRP APPL 2/3 (GAUZE/BANDAGES/DRESSINGS) ×4 IMPLANT
CABLE HIGH FREQUENCY MONO STRZ (ELECTRODE) ×4 IMPLANT
CATH CHOLANG 76X19 KUMAR (CATHETERS) ×4 IMPLANT
CHLORAPREP W/TINT 26ML (MISCELLANEOUS) ×4 IMPLANT
CLIP APPLIE ROT 10 11.4 M/L (STAPLE) ×2 IMPLANT
CLIP VESOLOCK LG 6/CT PURPLE (CLIP) IMPLANT
CLIP VESOLOCK MED LG 6/CT (CLIP) IMPLANT
CLOSURE WOUND 1/2 X4 (GAUZE/BANDAGES/DRESSINGS) ×1
COVER MAYO STAND STRL (DRAPES) ×4 IMPLANT
COVER SURGICAL LIGHT HANDLE (MISCELLANEOUS) ×4 IMPLANT
DECANTER SPIKE VIAL GLASS SM (MISCELLANEOUS) ×4 IMPLANT
DERMABOND ADVANCED (GAUZE/BANDAGES/DRESSINGS)
DERMABOND ADVANCED .7 DNX12 (GAUZE/BANDAGES/DRESSINGS) IMPLANT
DRAIN CHANNEL 19F RND (DRAIN) IMPLANT
DRAPE C-ARM 42X120 X-RAY (DRAPES) ×4 IMPLANT
EVACUATOR SILICONE 100CC (DRAIN) IMPLANT
GLOVE BIOGEL PI IND STRL 7.0 (GLOVE) ×2 IMPLANT
GLOVE BIOGEL PI INDICATOR 7.0 (GLOVE) ×2
GLOVE SURG SS PI 7.0 STRL IVOR (GLOVE) ×4 IMPLANT
GOWN STRL REUS W/TWL LRG LVL3 (GOWN DISPOSABLE) ×4 IMPLANT
GOWN STRL REUS W/TWL XL LVL3 (GOWN DISPOSABLE) ×8 IMPLANT
GRASPER SUT TROCAR 14GX15 (MISCELLANEOUS) IMPLANT
KIT BASIN OR (CUSTOM PROCEDURE TRAY) ×4 IMPLANT
POUCH RETRIEVAL ECOSAC 10 (ENDOMECHANICALS) ×2 IMPLANT
POUCH RETRIEVAL ECOSAC 10MM (ENDOMECHANICALS) ×2
SCISSORS LAP 5X35 DISP (ENDOMECHANICALS) ×4 IMPLANT
SET CHOLANGIOGRAPH MIX (MISCELLANEOUS) ×4 IMPLANT
SET IRRIG TUBING LAPAROSCOPIC (IRRIGATION / IRRIGATOR) ×4 IMPLANT
SHEARS HARMONIC ACE PLUS 36CM (ENDOMECHANICALS) IMPLANT
SLEEVE XCEL OPT CAN 5 100 (ENDOMECHANICALS) ×8 IMPLANT
STOPCOCK 4 WAY LG BORE MALE ST (IV SETS) ×4 IMPLANT
STRIP CLOSURE SKIN 1/2X4 (GAUZE/BANDAGES/DRESSINGS) ×3 IMPLANT
SUT ETHILON 2 0 PS N (SUTURE) IMPLANT
SUT MNCRL AB 4-0 PS2 18 (SUTURE) ×4 IMPLANT
SUT VICRYL 0 ENDOLOOP (SUTURE) IMPLANT
TOWEL OR 17X26 10 PK STRL BLUE (TOWEL DISPOSABLE) ×4 IMPLANT
TOWEL OR NON WOVEN STRL DISP B (DISPOSABLE) ×4 IMPLANT
TRAY LAPAROSCOPIC (CUSTOM PROCEDURE TRAY) ×4 IMPLANT
TROCAR BLADELESS OPT 5 100 (ENDOMECHANICALS) ×4 IMPLANT
TROCAR XCEL NON-BLD 11X100MML (ENDOMECHANICALS) ×4 IMPLANT
TUBING INSUF HEATED (TUBING) ×4 IMPLANT

## 2018-06-14 NOTE — Progress Notes (Signed)
Progress Note: General Surgery Service   Assessment/Plan: Patient Active Problem List   Diagnosis Date Noted  . Cholelithiasis 06/13/2018  . Acute cholecystitis 06/13/2018  . Gallstones and inflammation of gallbladder without obstruction 06/12/2018  . Chronic idiopathic constipation 06/12/2018  . Diabetic infection of right foot ([place]) 07/12/2017  . Foot ulcer, right, with unspecified severity ([place]) 01/17/2017  . Complex sleep apnea syndrome 01/12/2016  . Ex-smoker 01/01/2016  . Diabetes mellitus type 2, uncontrolled, with complications ([place]) 78/46/9629  . OSA (obstructive sleep apnea) 12/18/2015  . Benign prostatic hyperplasia 02/18/2014  . Routine general medical examination at a health care facility 02/18/2014  . PAD (peripheral artery disease) ([place]) 02/18/2014  . DEGENERATIVE JOINT DISEASE 02/20/2008  . Diabetes mellitus type 2 with atherosclerosis of arteries of extremities ([place]) 01/26/2008  . Hyperlipidemia with target LDL less than 70 01/26/2008  . Obesity 01/26/2008  . Mitral valve disorder 01/26/2008  . GERD 01/26/2008  . Irritable bowel syndrome 01/26/2008   Choledocholithiasis, bili downtrending, pain improved -I do not think a HIDA is a useful diagnostic and will cancel it -I think he has passed a stone as evidenced by his bili and pain increasing and decreasing together -We discussed the etiology of her pain, we discussed treatment options and recommended surgery. We discussed details of surgery including general anesthesia, laparoscopic approach, identification of cystic duct and common bile duct. Ligation of cystic duct and cystic artery. Possible need for intraoperative cholangiogram or open procedure. Possible risks of common bile duct injury, liver injury, cystic duct leak, bleeding, infection, post-cholecystectomy syndrome. The patient showed good understanding and all questions were answered -OR today for lap chole w IOC    LOS: 1 day  Chief  Complaint/Subjective: Pain improved, feels constipated  Objective: Vital signs in last 24 hours: Temp:  [98.3 F (36.8 C)-98.6 F (37 C)] 98.3 F (36.8 C) (07/31 0450) Pulse Rate:  [66-70] 66 (07/31 0450) Resp:  [18-20] 18 (07/31 0450) BP: (132-141)/(73-78) 132/74 (07/31 0450) SpO2:  [96 %-99 %] 96 % (07/31 0450) Weight:  [137.9 kg (304 lb)-138.1 kg (304 lb 6.4 oz)] 138.1 kg (304 lb 6.4 oz) (07/31 0524) Last BM Date: 06/13/18  Intake/Output from previous day: 07/30 0701 - 07/31 0700 In: 470 [P.O.:240; I.V.:30; IV Piggyback:200] Out: -  Intake/Output this shift: No intake/output data recorded.  Lungs: CTAB  Cardiovascular: RRR  Abd: soft, NT, ND, lower midline incision  Extremities: no edema  Neuro: AOx4  Lab Results: CBC  Recent Labs    06/13/18 1836 06/14/18 0541  WBC 4.0 3.8*  HGB 11.7* 11.2*  HCT 35.3* 33.7*  PLT 132* 146*   BMET Recent Labs    06/13/18 1836 06/14/18 0541  NA 138 142  K 3.6 3.8  CL 102 104  CO2 28 30  GLUCOSE 183* 176*  BUN 15 15  CREATININE 1.09 1.02  CALCIUM 8.5* 8.6*   PT/INR No results for input(s): LABPROT, INR in the last 72 hours. ABG No results for input(s): PHART, HCO3 in the last 72 hours.  Invalid input(s): PCO2, PO2  Studies/Results:  Anti-infectives: Anti-infectives (From admission, onward)   Start     Dose/Rate Route Frequency Ordered Stop   06/13/18 2230  cefTRIAXone (ROCEPHIN) 2 g in sodium chloride 0.9 % 100 mL IVPB     2 g 200 mL/hr over 30 Minutes Intravenous Every 24 hours 06/13/18 2213     06/13/18 2230  metroNIDAZOLE (FLAGYL) IVPB 500 mg     500 mg 100 mL/hr over 60  Minutes Intravenous Every 8 hours 06/13/18 2213        Medications: Scheduled Meds: . docusate sodium  100 mg Oral BID  . insulin aspart  0-9 Units Subcutaneous Q4H  . linaclotide  145 mcg Oral QAC breakfast  . simvastatin  40 mg Oral QPM   Continuous Infusions: . sodium chloride 500 mL (06/14/18 0055)  . cefTRIAXone  (ROCEPHIN)  IV 2 g (06/14/18 0057)  . metronidazole 500 mg (06/14/18 0200)   PRN Meds:.sodium chloride, acetaminophen **OR** acetaminophen, morphine injection, ondansetron **OR** ondansetron (ZOFRAN) IV  Mickeal Skinner, MD Pg# [phone] [facility]

## 2018-06-14 NOTE — Progress Notes (Signed)
PROGRESS NOTE    Jason [name]  JKK:[id number] DOB: 1941/04/04 DOA: 06/13/2018 PCP: [name], MD     Brief Narrative:  Jason [name] is a 77 y.o. male with medical history significant for type 2 DM who was recently evaluated in the ED and noted to have constipation, the treatment of which has resolved his abdominal pain. During the workup of his pain, however, he was noted to have cholelithiasis on abdominal US and have abnormal LFTs as well. He has slightly elevated bilirubin, and on follow up with his PCP yesterday, while he continues to feel well with no abdominal pain, nausea, bloating or constipation, his bilirubin has actually increased so he was told to come to the ER for evaluation. In the ER at [facility], he was noted to have elevated bilirubin of 3.8 down from 7. Case with discussed with on call surgery who recommended further workup with HIDA scan since his LFTs have not normalized.   New events last 24 hours / Subjective: No acute events overnight.  General surgery evaluated patient, plans for OR for cystectomy with IOC today. He has no abdominal pain.  Assessment & Plan:   Principal Problem:   Choledocholithiasis Active Problems:   Diabetes mellitus type 2 with atherosclerosis of arteries of extremities (H[facility])   GERD   Benign prostatic hyperplasia   PAD (peripheral artery disease) ([facility])   Chronic idiopathic constipation   Cholelithiasis  Choledocholithiasis -HIDA scan canceled this morning, patient to go to the OR with general surgery today  Elevated LFT -Secondary to above, trend LFT -Trending down   Type 2 diabetes -SSI   Chronic constipation -Continue linzess  HLD -Continue zocor     DVT prophylaxis: SCD Code Status: Full Family Communication: No family at bedside Disposition Plan: Pending improvement, surgery    Consultants:   General surgery   Procedures:   Lap chole 7/31 - Dr. Kieth Brightly   Antimicrobials:  Anti-infectives  (From admission, onward)   Start     Dose/Rate Route Frequency Ordered Stop   06/13/18 2230  [MAR Hold]  cefTRIAXone (ROCEPHIN) 2 g in sodium chloride 0.9 % 100 mL IVPB     (MAR Hold since Wed 06/14/2018 at 1001. Reason: Transfer to a Procedural area.)   2 g 200 mL/hr over 30 Minutes Intravenous Every 24 hours 06/13/18 2213     06/13/18 2230  [MAR Hold]  metroNIDAZOLE (FLAGYL) IVPB 500 mg     (MAR Hold since Wed 06/14/2018 at 1001. Reason: Transfer to a Procedural area.)   500 mg 100 mL/hr over 60 Minutes Intravenous Every 8 hours 06/13/18 2213          Objective: Vitals:   06/14/18 0450 06/14/18 0524 06/14/18 1010 06/14/18 1303  BP: 132/74  137/74 (!) 142/69  Pulse: 66  64 79  Resp: 18  18 (!) 22  Temp: 98.3 F (36.8 C)  98.5 F (36.9 C) 98.4 F (36.9 C)  TempSrc: Oral  Oral   SpO2: 96%  97% (!) 87%  Weight:  (!) 138.1 kg (304 lb 6.4 oz)    Height:  6\' 8"  (2.032 m)      Intake/Output Summary (Last 24 hours) at 06/14/2018 1313 Last data filed at 06/14/2018 1306 Gross per 24 hour  Intake 1670 ml  Output 300 ml  Net 1370 ml   Filed Weights   06/13/18 1723 06/14/18 0524  Weight: (!) 137.9 kg (304 lb) (!) 138.1 kg (304 lb 6.4 oz)    Examination:  General exam: Appears calm and comfortable  Respiratory system: Clear to auscultation. Respiratory effort normal. Cardiovascular system: S1 & S2 heard, RRR. No JVD, murmurs, rubs, gallops or clicks. No pedal edema. Gastrointestinal system: Abdomen is nondistended, soft and nontender. No organomegaly or masses felt. Normal bowel sounds heard. Central nervous system: Alert and oriented. No focal neurological deficits. Extremities: Symmetric 5 x 5 power. Skin: No rashes, lesions or ulcers Psychiatry: Judgement and insight appear normal. Mood & affect appropriate.   Data Reviewed: I have personally reviewed following labs and imaging studies  CBC: Recent Labs  Lab 06/09/18 1313 06/12/18 1508 06/13/18 1836 06/14/18 0541  WBC  10.6* 5.5 4.0 3.8*  NEUTROABS  --  4.2 2.6  --   HGB 14.0 12.3* 11.7* 11.2*  HCT 41.8 36.3* 35.3* 33.7*  MCV 95.4 95.4 95.7 96.3  PLT 144* 122.0* 132* 166*   Basic Metabolic Panel: Recent Labs  Lab 06/09/18 1313 06/12/18 1508 06/13/18 1836 06/14/18 0541  NA 142 138 138 142  K 3.9 4.0 3.6 3.8  CL 101 100 102 104  CO2 31 30 28 30   GLUCOSE 274* 181* 183* 176*  BUN 13 19 15 15   CREATININE 0.87 1.37 1.09 1.02  CALCIUM 9.1 9.2 8.5* 8.6*   GFR: Estimated Creatinine Clearance: 98.3 mL/min (by C-G formula based on SCr of 1.02 mg/dL). Liver Function Tests: Recent Labs  Lab 06/09/18 1313 06/12/18 1508 06/13/18 1836 06/14/18 0541  AST 983* 73* 47* 38  ALT 541* 353* 240* 198*  ALKPHOS 92 111 101 94  BILITOT 2.5* 7.3* 3.8* 3.3*  PROT 7.6 7.2 7.1 6.6  ALBUMIN 4.0 3.8 3.0* 2.9*   Recent Labs  Lab 06/09/18 1313 06/13/18 1836  LIPASE 25 32   No results for input(s): AMMONIA in the last 168 hours. Coagulation Profile: No results for input(s): INR, PROTIME in the last 168 hours. Cardiac Enzymes: No results for input(s): CKTOTAL, CKMB, CKMBINDEX, TROPONINI in the last 168 hours. BNP (last 3 results) No results for input(s): PROBNP in the last 8760 hours. HbA1C: Recent Labs    06/12/18 1508  HGBA1C 7.4*   CBG: Recent Labs  Lab 06/14/18 0001 06/14/18 0451 06/14/18 0932  GLUCAP 266* 164* 116*   Lipid Profile: No results for input(s): CHOL, HDL, LDLCALC, TRIG, CHOLHDL, LDLDIRECT in the last 72 hours. Thyroid Function Tests: Recent Labs    06/12/18 1508  TSH 1.66   Anemia Panel: No results for input(s): VITAMINB12, FOLATE, FERRITIN, TIBC, IRON, RETICCTPCT in the last 72 hours. Sepsis Labs: No results for input(s): PROCALCITON, LATICACIDVEN in the last 168 hours.  No results found for this or any previous visit (from the past 240 hour(s)).     Radiology Studies: US Abdomen Limited Ruq  Result Date: 06/13/2018 CLINICAL DATA:  Elevated LFTs and bilirubin EXAM:  ULTRASOUND ABDOMEN LIMITED RIGHT UPPER QUADRANT COMPARISON:  06/09/2018 FINDINGS: Gallbladder: Thickened gallbladder wall. Gallbladder appears distended. Dependent sludge with dependent tiny calculi 2 mm diameter. Minimal pericholecystic fluid. No sonographic Murphy sign. Acute cholecystitis questioned. Common bile duct: Diameter: 5 mm diameter, normal Liver: Normal appearance. Portal vein is patent on color Doppler imaging with normal direction of blood flow towards the liver. No ascites. IMPRESSION: Sludge and tiny calculi in gallbladder with associated gallbladder wall thickening and minimal pericholecystic fluid. Question of acute cholecystitis is raised. Consider follow-up radionuclide hepatobiliary imaging if clinically indicated. Electronically Signed   By: Lavonia Dana M.D.   On: 06/13/2018 18:40      Scheduled Meds: . [facility]  Hold] docusate sodium  100 mg Oral BID  . [MAR Hold] insulin aspart  0-9 Units Subcutaneous Q4H  . [MAR Hold] linaclotide  145 mcg Oral QAC breakfast  . [MAR Hold] simvastatin  40 mg Oral QPM   Continuous Infusions: . [MAR Hold] sodium chloride 500 mL (06/14/18 0055)  . [MAR Hold] cefTRIAXone (ROCEPHIN)  IV 2 g (06/14/18 0057)  . lactated ringers 75 mL/hr at 06/14/18 1013  . [MAR Hold] metronidazole 500 mg (06/14/18 0200)     LOS: 1 day    Time spent: 35 minutes   Dessa Phi, DO [facility] [email or web address] Password [email or web address] 06/14/2018, 1:13 PM

## 2018-06-14 NOTE — Anesthesia Procedure Notes (Signed)
Procedure Name: Intubation Date/Time: 06/14/2018 11:33 AM Performed by: Jamelyn Bovard D, CRNA Pre-anesthesia Checklist: Patient identified, Emergency Drugs available, Suction available and Patient being monitored Patient Re-evaluated:Patient Re-evaluated prior to induction Oxygen Delivery Method: Circle system utilized Preoxygenation: Pre-oxygenation with 100% oxygen Induction Type: IV induction Ventilation: Mask ventilation without difficulty Laryngoscope Size: Mac and 4 Grade View: Grade I Tube type: Oral Tube size: 7.5 mm Number of attempts: 1 Airway Equipment and Method: Stylet and Oral airway Placement Confirmation: ETT inserted through vocal cords under direct vision,  positive ETCO2 and breath sounds checked- equal and bilateral Secured at: 23 cm Tube secured with: Tape Dental Injury: Teeth and Oropharynx as per pre-operative assessment

## 2018-06-14 NOTE — Anesthesia Preprocedure Evaluation (Addendum)
Anesthesia Evaluation  Patient identified by MRN, date of birth, ID band Patient awake    Reviewed: Allergy & Precautions, NPO status , Patient's Chart, lab work & pertinent test results  Airway Mallampati: I  TM Distance: >3 FB Neck ROM: Full    Dental  (+) Edentulous Upper,    Pulmonary asthma , sleep apnea and Continuous Positive Airway Pressure Ventilation , former smoker,    Pulmonary exam normal breath sounds clear to auscultation       Cardiovascular + Peripheral Vascular Disease  Normal cardiovascular exam+ Valvular Problems/Murmurs MVP  Rhythm:Regular Rate:Normal     Neuro/Psych PSYCHIATRIC DISORDERS Anxiety Depression negative neurological ROS     GI/Hepatic Neg liver ROS, GERD  Controlled,Irritable bowel syndrome   Endo/Other  diabetes, Oral Hypoglycemic Agents  Renal/GU negative Renal ROS     Musculoskeletal negative musculoskeletal ROS (+)   Abdominal (+) + obese,   Peds  Hematology  (+) anemia , HLD Thrombocytopenia   Anesthesia Other Findings Choleductolithiasis  Reproductive/Obstetrics                            Anesthesia Physical Anesthesia Plan  ASA: III  Anesthesia Plan: General   Post-op Pain Management:    Induction: Intravenous  PONV Risk Score and Plan: 3 and Ondansetron, Dexamethasone and Treatment may vary due to age or medical condition  Airway Management Planned: Oral ETT  Additional Equipment:   Intra-op Plan:   Post-operative Plan: Extubation in OR  Informed Consent: I have reviewed the patients History and Physical, chart, labs and discussed the procedure including the risks, benefits and alternatives for the proposed anesthesia with the patient or authorized representative who has indicated his/her understanding and acceptance.   Dental advisory given  Plan Discussed with: CRNA  Anesthesia Plan Comments:        Anesthesia Quick  Evaluation

## 2018-06-14 NOTE — Progress Notes (Signed)
Order received for nocturnal CPAP. Patient declines at this time. He states he is supposed to wear one at home but does not. He is aware that if he should become more compliant, he may notify staff for further RT assistance.

## 2018-06-14 NOTE — Discharge Instructions (Signed)
[facility] ______[facility] °LAPAROSCOPIC SURGERY: POST OP INSTRUCTIONS °Always review your discharge instruction sheet given to you by the facility where your surgery was performed. °IF YOU HAVE DISABILITY OR FAMILY LEAVE FORMS, YOU MUST BRING THEM TO THE OFFICE FOR PROCESSING.   °DO NOT GIVE THEM TO YOUR DOCTOR. ° °1. A prescription for pain medication may be given to you upon discharge.  Take your pain medication as prescribed, if needed.  If narcotic pain medicine is not needed, then you may take acetaminophen (Tylenol) or ibuprofen (Advil) as needed. °2. Take your usually prescribed medications unless otherwise directed. °3. If you need a refill on your pain medication, please contact your pharmacy.  They will contact our office to request authorization. Prescriptions will not be filled after 5pm or on week-ends. °4. You should follow a light diet the first few days after arrival home, such as soup and crackers, etc.  Be sure to include lots of fluids daily. °5. Most patients will experience some swelling and bruising in the area of the incisions.  Ice packs will help.  Swelling and bruising can take several days to resolve.  °6. It is common to experience some constipation if taking pain medication after surgery.  Increasing fluid intake and taking a stool softener (such as Colace) will usually help or prevent this problem from occurring.  A mild laxative (Milk of Magnesia or Miralax) should be taken according to package instructions if there are no bowel movements after 48 hours. °7. Unless discharge instructions indicate otherwise, you may remove your bandages 24-48 hours after surgery, and you may shower at that time.  You may have steri-strips (small skin tapes) in place directly over the incision.  These strips should be left on the skin for 7-10 days.  If your surgeon used skin glue on the incision, you may shower in 24 hours.  The glue will flake off over the next 2-3 weeks.  Any sutures or  staples will be removed at the office during your follow-up visit. °8. ACTIVITIES:  You may resume regular (light) daily activities beginning the next day--such as daily self-care, walking, climbing stairs--gradually increasing activities as tolerated.  You may have sexual intercourse when it is comfortable.  Refrain from any heavy lifting or straining until approved by your doctor. °a. You may drive when you are no longer taking prescription pain medication, you can comfortably wear a seatbelt, and you can safely maneuver your car and apply brakes. °b. RETURN TO WORK:  __________________________________________________________ °9. You should see your doctor in the office for a follow-up appointment approximately 2-3 weeks after your surgery.  Make sure that you call for this appointment within a day or two after you arrive home to insure a convenient appointment time. °10. OTHER INSTRUCTIONS: __________________________________________________________________________________________________________________________ __________________________________________________________________________________________________________________________ °WHEN TO CALL YOUR DOCTOR: °1. Fever over 101.0 °2. Inability to urinate °3. Continued bleeding from incision. °4. Increased pain, redness, or drainage from the incision. °5. Increasing abdominal pain ° °The clinic staff is available to answer your questions during regular business hours.  Please don’t hesitate to call and ask to speak to one of the nurses for clinical concerns.  If you have a medical emergency, go to the nearest emergency room or call 911.  A surgeon from [facility] is always on call at the hospital. °[address], [address], [place], [place]  [address] ? [address] [address], [place],    [address] °[phone] ? [phone] ? FAX [phone] °Web site:   [email or web address] ° ° °Laparoscopic Cholecystectomy, Care After °This sheet  gives you information about how to care for yourself after your procedure. Your health care provider may also give you more specific instructions. If you have problems or questions, contact your health care provider. °What can I expect after the procedure? °After the procedure, it is common to have: °· Pain at your incision sites. You will be given medicines to control this pain. °· Mild nausea or vomiting. °· Bloating and possible shoulder pain from the air-like gas that was used during the procedure. ° °Follow these instructions at home: °Incision care ° °· Follow instructions from your health care provider about how to take care of your incisions. Make sure you: °? Wash your hands with soap and water before you change your bandage (dressing). If soap and water are not available, use hand sanitizer. °? Change your dressing as told by your health care provider. °? Leave stitches (sutures), skin glue, or adhesive strips in place. These skin closures may need to be in place for 2 weeks or longer. If adhesive strip edges start to loosen and curl up, you may trim the loose edges. Do not remove adhesive strips completely unless your health care provider tells you to do that. °· Do not take baths, swim, or use a hot tub until your health care provider approves. Ask your health care provider if you can take showers. You may only be allowed to take sponge baths for bathing. °· Check your incision area every day for signs of infection. Check for: °? More redness, swelling, or pain. °? More fluid or blood. °? Warmth. °? Pus or a bad smell. °Activity °· Do not drive or use heavy machinery while taking prescription pain medicine. °· Do not lift anything that is heavier than 10 lb (4.5 kg) until your health care provider approves. °· Do not play contact sports until your health care provider approves. °· Do not drive for 24 hours if you were given a medicine to help you relax (sedative). °· Rest as needed. Do not return to work  or school until your health care provider approves. °General instructions °· Take over-the-counter and prescription medicines only as told by your health care provider. °· To prevent or treat constipation while you are taking prescription pain medicine, your health care provider may recommend that you: °? Drink enough fluid to keep your urine clear or pale yellow. °? Take over-the-counter or prescription medicines. °? Eat foods that are high in fiber, such as fresh fruits and vegetables, whole grains, and beans. °? Limit foods that are high in fat and processed sugars, such as fried and sweet foods. °Contact a health care provider if: °· You develop a rash. °· You have more redness, swelling, or pain around your incisions. °· You have more fluid or blood coming from your incisions. °· Your incisions feel warm to the touch. °· You have pus or a bad smell coming from your incisions. °· You have a fever. °· One or more of your incisions breaks open. °Get help right away if: °· You have trouble breathing. °· You have chest pain. °· You have increasing pain in your shoulders. °· You faint or feel dizzy when you stand. °· You have severe pain in your abdomen. °· You have nausea or vomiting that lasts for more than one day. °· You have leg pain. °This information is not intended to replace advice given to you by your health care provider. Make sure you   discuss any questions you have with your health care provider. °Document Released: 11/01/2005 Document Revised: 05/22/2016 Document Reviewed: 04/19/2016 °Elsevier Interactive Patient Education © 2018 [employer] ° °

## 2018-06-14 NOTE — Progress Notes (Signed)
Pt taken off unit to OR in stable condition. Inform consent obtained, belongings removed and stored in room.

## 2018-06-14 NOTE — Transfer of Care (Signed)
Immediate Anesthesia Transfer of Care Note  Patient: Jason [name]  Procedure(s) Performed: LAPAROSCOPIC CHOLECYSTECTOMY WITH INTRAOPERATIVE CHOLANGIOGRAM (N/A Abdomen) LAPAROSCOPIC LYSIS OF ADHESIONS (Abdomen)  Patient Location: PACU  Anesthesia Type:General  Level of Consciousness: awake, alert  and oriented  Airway & Oxygen Therapy: Patient Spontanous Breathing and Patient connected to face mask oxygen  Post-op Assessment: Report given to RN and Post -op Vital signs reviewed and stable  Post vital signs: Reviewed and stable  Last Vitals:  Vitals Value Taken Time  BP 142/69 06/14/2018  1:03 PM  Temp    Pulse 74 06/14/2018  1:05 PM  Resp 22 06/14/2018  1:05 PM  SpO2 95 % 06/14/2018  1:05 PM  Vitals shown include unvalidated device data.  Last Pain:  Vitals:   06/14/18 1010  TempSrc: Oral  PainSc:          Complications: No apparent anesthesia complications

## 2018-06-14 NOTE — Op Note (Signed)
PATIENT:  Jason [name]  77 y.o. male  PRE-OPERATIVE DIAGNOSIS:  choleductolithiasis  POST-OPERATIVE DIAGNOSIS:  choleductolithiasis  PROCEDURE:  Procedure(s): LAPAROSCOPIC CHOLECYSTECTOMY WITH INTRAOPERATIVE CHOLANGIOGRAM LAPAROSCOPIC LYSIS OF ADHESIONS  SURGEON:  Surgeon(s): Quante Pettry, Arta Bruce, MD  ASSISTANT: none  ANESTHESIA:   local and general  Indications for procedure: Jason [name] is a 77 y.o. male with symptoms of Abdominal pain and Nausea and vomiting consistent with gallbladder disease, Confirmed by Ultrasound and CT.  Description of procedure: The patient was brought into the operative suite, placed supine. Anesthesia was administered with endotracheal tube. Patient was strapped in place and foot board was secured. All pressure points were offloaded by foam padding. The patient was prepped and draped in the usual sterile fashion.  A small incision was in the right subcostal area. A 41mm trocar was inserted into the peritoneal cavity with optical entry. Pneumoperitoneum was applied with high flow low pressure. On visualization there was a large amount of omentum and small intestine adhesed to the abdominal wall from previous laparotomy. 1 additional 29mm trocar was placed in the right lateral space and 10 minutes of lysis of adhesion was performed with sharp and blunt dissection to gain space for additional trocars. 1 57mm trocar to the right and superioer to the umbilicus was placed. A 51mm trocar was placed in the subxiphoid space. 30 ml marcaine was infused to the subxiphoid space and lateral upper right abdomen in the preperitoneal spaces. Next the patient was placed in reverse trendelenberg. The gallbladder had many adhesions to the omentum and colon. These were taken down with blunt dissection and occasional cautery.  The gallbladder was retracted cephalad and lateral. The peritoneum was reflected off the infundibulum working lateral to medial. "The cystic duct and cystic  artery were identified and further dissection revealed a critical view, due to concern for choledocholithiasis a cholangiogram was performed with ductotomy and cook catheter passed through a separate subcostal stab incision. The cholangiogram showed no filling defects and filling of right and left hepatic ducts and duodenum. The cystic duct and cystic artery were doubly clipped and ligated.   The gallbladder was removed off the liver bed with cautery. The Gallbladder was placed in a specimen bag. The gallbladder fossa was irrigated and hemostasis was applied with cautery. The gallbladder was removed via the 4mm trocar. The fascial defect was closed with interrupted 0 vicryl suture via laparoscopic trans-fascial suture passer. Pneumoperitoneum was removed, all trocar were removed. All incisions were closed with 4-0 monocryl subcuticular stitch. The patient woke from anesthesia and was brought to PACU in stable condition. All counts were correct  Findings: inflamed gallbladder, adhesions of omentum and small intestine to abdominal wall, normal ductal anatomy  Specimen: gallbladder  Blood loss: 70 ml  Local anesthesia: 30 ml marcaine  Complications: none  PLAN OF CARE: Admit to inpatient   PATIENT DISPOSITION:  PACU - hemodynamically stable.  Images:     Gurney Maxin, M.D. General, Bariatric, & Minimally Invasive Surgery [facility]

## 2018-06-14 NOTE — Anesthesia Postprocedure Evaluation (Signed)
Anesthesia Post Note  Patient: Jason [name]  Procedure(s) Performed: LAPAROSCOPIC CHOLECYSTECTOMY WITH INTRAOPERATIVE CHOLANGIOGRAM (N/A Abdomen) LAPAROSCOPIC LYSIS OF ADHESIONS (Abdomen)     Patient location during evaluation: PACU Anesthesia Type: General Level of consciousness: awake and alert Pain management: pain level controlled Vital Signs Assessment: post-procedure vital signs reviewed and stable Respiratory status: spontaneous breathing, nonlabored ventilation, respiratory function stable and patient connected to nasal cannula oxygen Cardiovascular status: blood pressure returned to baseline and stable Postop Assessment: no apparent nausea or vomiting Anesthetic complications: no    Last Vitals:  Vitals:   06/14/18 1415 06/14/18 1530  BP: (!) 148/69 (!) 146/70  Pulse: 68 69  Resp: 18 16  Temp: 36.6 C 36.9 C  SpO2: 100% 99%    Last Pain:  Vitals:   06/14/18 1530  TempSrc: Oral  PainSc:                  [name] Erikson Danzy

## 2018-06-14 NOTE — Plan of Care (Signed)
Pt alert and oriented, resting with soreness at incision sites.  RN will monitor.

## 2018-06-15 ENCOUNTER — Encounter (HOSPITAL_COMMUNITY): Payer: Self-pay | Admitting: General Surgery

## 2018-06-15 LAB — COMPREHENSIVE METABOLIC PANEL
ALBUMIN: 3.2 g/dL — AB (ref 3.5–5.0)
ALT: 181 U/L — AB (ref 0–44)
AST: 55 U/L — ABNORMAL HIGH (ref 15–41)
Alkaline Phosphatase: 92 U/L (ref 38–126)
Anion gap: 9 (ref 5–15)
BILIRUBIN TOTAL: 3 mg/dL — AB (ref 0.3–1.2)
BUN: 17 mg/dL (ref 8–23)
CALCIUM: 8.7 mg/dL — AB (ref 8.9–10.3)
CO2: 27 mmol/L (ref 22–32)
CREATININE: 0.95 mg/dL (ref 0.61–1.24)
Chloride: 103 mmol/L (ref 98–111)
GFR calc Af Amer: >60 mL/min (ref >60)
GFR calc non Af Amer: >60 mL/min (ref >60)
GLUCOSE: 224 mg/dL — AB (ref 70–99)
Potassium: 4.1 mmol/L (ref 3.5–5.1)
SODIUM: 139 mmol/L (ref 135–145)
Total Protein: 6.9 g/dL (ref 6.5–8.1)

## 2018-06-15 LAB — CBC
HEMATOCRIT: 35.9 % — AB (ref 39.0–52.0)
HEMOGLOBIN: 11.8 g/dL — AB (ref 13.0–17.0)
MCH: 31.5 pg (ref 26.0–34.0)
MCHC: 32.9 g/dL (ref 30.0–36.0)
MCV: 95.7 fL (ref 78.0–100.0)
Platelets: 177 10*3/uL (ref 150–400)
RBC: 3.75 MIL/uL — AB (ref 4.22–5.81)
RDW: 12.2 % (ref 11.5–15.5)
WBC: 8.4 10*3/uL (ref 4.0–10.5)

## 2018-06-15 LAB — HEPATITIS PANEL, ACUTE
HCV Ab: <0.1 s/co ratio (ref 0.0–0.9)
HEP A IGM: NEGATIVE
HEP B C IGM: NEGATIVE
Hepatitis B Surface Ag: NEGATIVE

## 2018-06-15 LAB — GLUCOSE, CAPILLARY
Glucose-Capillary: 174 mg/dL — ABNORMAL HIGH (ref 70–99)
Glucose-Capillary: 187 mg/dL — ABNORMAL HIGH (ref 70–99)
Glucose-Capillary: 217 mg/dL — ABNORMAL HIGH (ref 70–99)
Glucose-Capillary: 250 mg/dL — ABNORMAL HIGH (ref 70–99)

## 2018-06-15 MED ORDER — INSULIN ASPART 100 UNIT/ML ~~LOC~~ SOLN
0.0000 [IU] | Freq: Three times a day (TID) | SUBCUTANEOUS | Status: DC
  Administered 2018-06-15 (×2): 2 [IU] via SUBCUTANEOUS

## 2018-06-15 MED ORDER — DOCUSATE SODIUM 100 MG PO CAPS: 100.0000 mg | ORAL_CAPSULE | Freq: Two times a day (BID) | ORAL | 0 refills | Status: DC

## 2018-06-15 MED ORDER — HYDROCODONE-ACETAMINOPHEN 5-325 MG PO TABS: 1.0000 | ORAL_TABLET | Freq: Four times a day (QID) | ORAL | 0 refills | Status: DC | PRN

## 2018-06-15 MED ORDER — POLYETHYLENE GLYCOL 3350 17 G PO PACK: 17.0000 g | PACK | Freq: Every day | ORAL | 0 refills | Status: DC

## 2018-06-15 NOTE — Plan of Care (Signed)
Pt alert and oriented, ambulated in halls and tolerating diet.  Pain well controlled with PO pain meds.  Plan to d/c home after lunch per MD order.

## 2018-06-15 NOTE — Progress Notes (Signed)
CBG 408. Paged Dr. Marlou Starks.

## 2018-06-15 NOTE — Discharge Summary (Signed)
Physician Discharge Summary  Jason [name] MBT:[id number] DOB: 08-03-1941 DOA: 06/13/2018  PCP: [name], MD  Admit date: 06/13/2018 Discharge date: 06/15/2018  Admitted From: Home Disposition:  Home  Recommendations for Outpatient Follow-up:  1. Follow up with PCP in 1 week 2. Follow up with Dr. Kieth Brightly on 8/15 3. Please obtain LFT in 1 week   Discharge Condition: Stable CODE STATUS: Full  Diet recommendation: Carb modified diet   Brief/Interim Summary: Jason [name] Amosis a 77 y.o.malewith medical history significant fortype 2 DM who was recently evaluated in the ED and noted to have constipation, the treatment of which has resolved his abdominal pain. During the workup of his pain, however, he was noted to have cholelithiasis on abdominal US and have abnormal LFTs as well. He has slightly elevated bilirubin, and on follow up with his PCP yesterday, while he continues to feel well with no abdominal pain, nausea, bloating or constipation, his bilirubin has actually increased so he was told to come to the ER for evaluation.In the ER at [facility], he was noted to have elevated bilirubin of 3.8 down from 7.  General surgery was consulted, patient underwent laparoscopic cholecystectomy with [facility] 7/31.   Discharge Diagnoses:  Principal Problem:   Choledocholithiasis Active Problems:   Diabetes mellitus type 2 with atherosclerosis of arteries of extremities (HCC)   GERD   Benign prostatic hyperplasia   PAD (peripheral artery disease) (HCC)   Chronic idiopathic constipation   Cholelithiasis   Choledocholithiasis -General surgery was consulted, patient underwent laparoscopic cholecystectomy with [facility] 7/31  Elevated LFT -Secondary to above, trend LFT -Trending down   Type 2 diabetes -Resume home amaryl, metformin   Chronic constipation -Continue linzess  HLD -Hold zocor until LFT normalizes    Discharge Instructions  Discharge Instructions    Call MD  for:  difficulty breathing, headache or visual disturbances   Complete by:  As directed    Call MD for:  extreme fatigue   Complete by:  As directed    Call MD for:  hives   Complete by:  As directed    Call MD for:  persistant dizziness or light-headedness   Complete by:  As directed    Call MD for:  persistant nausea and vomiting   Complete by:  As directed    Call MD for:  redness, tenderness, or signs of infection (pain, swelling, redness, odor or green/yellow discharge around incision site)   Complete by:  As directed    Call MD for:  severe uncontrolled pain   Complete by:  As directed    Call MD for:  temperature >100.4   Complete by:  As directed    Diet Carb Modified   Complete by:  As directed    Discharge instructions   Complete by:  As directed    You were cared for by a hospitalist during your hospital stay. If you have any questions about your discharge medications or the care you received while you were in the hospital after you are discharged, you can call the unit and ask to speak with the hospitalist on call if the hospitalist that took care of you is not available. Once you are discharged, your primary care physician will handle any further medical issues. Please note that NO REFILLS for any discharge medications will be authorized once you are discharged, as it is imperative that you return to your primary care physician (or establish a relationship with a primary care physician if you  do not have one) for your aftercare needs so that they can reassess your need for medications and monitor your lab values.   Increase activity slowly   Complete by:  As directed      Allergies as of 06/15/2018   No Known Allergies     Medication List    STOP taking these medications   simvastatin 40 MG tablet Commonly known as:  ZOCOR     TAKE these medications   aspirin EC 81 MG tablet Take 1 tablet (81 mg total) by mouth daily.   blood glucose meter kit and supplies Kit Use  to test blood sugar up to 3 times daily. DX E11.9   docusate sodium 100 MG capsule Commonly known as:  COLACE Take 1 capsule (100 mg total) by mouth 2 (two) times daily.   glimepiride 2 MG tablet Commonly known as:  AMARYL TAKE 1 TABLET EVERY DAY WITH BREAKFAST   HYDROcodone-acetaminophen 5-325 MG tablet Commonly known as:  NORCO/VICODIN Take 1 tablet by mouth every 6 (six) hours as needed for moderate pain.   linaclotide 145 MCG Caps capsule Commonly known as:  LINZESS Take 1 capsule (145 mcg total) by mouth daily before breakfast.   metFORMIN 500 MG tablet Commonly known as:  GLUCOPHAGE TAKE 1 TABLET TWICE DAILY WITH MEALS   polyethylene glycol packet Commonly known as:  MIRALAX / GLYCOLAX Take 17 g by mouth daily.   TRUE METRIX BLOOD GLUCOSE TEST test strip Generic drug:  glucose blood USE  TO TEST BLOOD SUGAR  UP  TO THREE TIMES DAILY   TRUE METRIX METER w/Device Kit Use to check blood sugar DX E11.8   TRUEPLUS LANCETS 33G Misc USE  TO TEST BLOOD SUGAR  UP  TO THREE TIMES DAILY   VITAMIN C PO Take 1 tablet by mouth daily.      Follow-up Information    [facility], [place] Follow up on 06/29/2018.   Specialty:  General Surgery Why:  Your follow-up appointment is at 1:30 PM.  Be at the office 30 minutes early for check-in, bring photo ID and insurance information. Contact information: [address] [place] [address] [phone]        [name], MD. Schedule an appointment as soon as possible for a visit in 1 week(s).   Specialty:  Internal Medicine Why:  Call Dr. Ronnald Ramp for follow-up appointment and let him know you had surgery.  Medical management per Dr. Ronnald Ramp. Contact information: [address] [address] [phone]          No Known Allergies  Consultations:  General surgery    Procedures/Studies: Dg Cholangiogram Operative  Result Date: 06/14/2018 CLINICAL DATA:  77 year old male  undergoing cholecystectomy for acute cholecystitis EXAM: INTRAOPERATIVE CHOLANGIOGRAM TECHNIQUE: Cholangiographic images from the C-arm fluoroscopic device were submitted for interpretation post-operatively. Please see the procedural report for the amount of contrast and the fluoroscopy time utilized. COMPARISON:  Abdominal ultrasound 06/13/2018 FINDINGS: Cine clip and intraoperative saved image obtained during intraoperative cholangiogram at the time of laparoscopic cholecystectomy. The images demonstrate cannulation of the cystic duct remanent and opacification of the biliary tree. No evidence of biliary ductal dilatation, stenosis, stricture or choledocholithiasis. Contrast material flows freely through the ampulla and into the duodenum. IMPRESSION: Negative intraoperative cholangiogram. Electronically Signed   By: Jacqulynn Cadet M.D.   On: 06/14/2018 13:16   US Abdomen Limited Ruq  Result Date: 06/13/2018 CLINICAL DATA:  Elevated LFTs and bilirubin  EXAM: ULTRASOUND ABDOMEN LIMITED RIGHT UPPER QUADRANT COMPARISON:  06/09/2018 FINDINGS: Gallbladder: Thickened gallbladder wall. Gallbladder appears distended. Dependent sludge with dependent tiny calculi 2 mm diameter. Minimal pericholecystic fluid. No sonographic Murphy sign. Acute cholecystitis questioned. Common bile duct: Diameter: 5 mm diameter, normal Liver: Normal appearance. Portal vein is patent on color Doppler imaging with normal direction of blood flow towards the liver. No ascites. IMPRESSION: Sludge and tiny calculi in gallbladder with associated gallbladder wall thickening and minimal pericholecystic fluid. Question of acute cholecystitis is raised. Consider follow-up radionuclide hepatobiliary imaging if clinically indicated. Electronically Signed   By: Lavonia Dana M.D.   On: 06/13/2018 18:40     Discharge Exam: Vitals:   06/14/18 2040 06/15/18 0454  BP: 139/75 136/65  Pulse: 71 67  Resp: 19   Temp: 98.7 F (37.1 C) 98.7 F (37.1 C)   SpO2: 97% 98%    General: Pt is alert, awake, not in acute distress Cardiovascular: RRR, S1/S2 +, no rubs, no gallops Respiratory: CTA bilaterally, no wheezing, no rhonchi Abdominal: Soft, NT, ND, bowel sounds + Extremities: no edema, no cyanosis    The results of significant diagnostics from this hospitalization (including imaging, microbiology, ancillary and laboratory) are listed below for reference.     Microbiology: No results found for this or any previous visit (from the past 240 hour(s)).   Labs: BNP (last 3 results) No results for input(s): BNP in the last 8760 hours. Basic Metabolic Panel: Recent Labs  Lab 06/09/18 1313 06/12/18 1508 06/13/18 1836 06/14/18 0541 06/14/18 2135 06/15/18 0535  NA 142 138 138 142  --  139  K 3.9 4.0 3.6 3.8  --  4.1  CL 101 100 102 104  --  103  CO2 '31 30 28 30  ' --  27  GLUCOSE 274* 181* 183* 176* 366* 224*  BUN '13 19 15 15  ' --  17  CREATININE 0.87 1.37 1.09 1.02  --  0.95  CALCIUM 9.1 9.2 8.5* 8.6*  --  8.7*   Liver Function Tests: Recent Labs  Lab 06/09/18 1313 06/12/18 1508 06/13/18 1836 06/14/18 0541 06/15/18 0535  AST 983* 73* 47* 38 55*  ALT 541* 353* 240* 198* 181*  ALKPHOS 92 111 101 94 92  BILITOT 2.5* 7.3* 3.8* 3.3* 3.0*  PROT 7.6 7.2 7.1 6.6 6.9  ALBUMIN 4.0 3.8 3.0* 2.9* 3.2*   Recent Labs  Lab 06/09/18 1313 06/13/18 1836  LIPASE 25 32   No results for input(s): AMMONIA in the last 168 hours. CBC: Recent Labs  Lab 06/09/18 1313 06/12/18 1508 06/13/18 1836 06/14/18 0541 06/15/18 0535  WBC 10.6* 5.5 4.0 3.8* 8.4  NEUTROABS  --  4.2 2.6  --   --   HGB 14.0 12.3* 11.7* 11.2* 11.8*  HCT 41.8 36.3* 35.3* 33.7* 35.9*  MCV 95.4 95.4 95.7 96.3 95.7  PLT 144* 122.0* 132* 146* 177   Cardiac Enzymes: No results for input(s): CKTOTAL, CKMB, CKMBINDEX, TROPONINI in the last 168 hours. BNP: Invalid input(s): POCBNP CBG: Recent Labs  Lab 06/14/18 1618 06/14/18 2036 06/15/18 0041 06/15/18 0455  06/15/18 0746  GLUCAP 262* 408* 250* 217* 187*   D-Dimer No results for input(s): DDIMER in the last 72 hours. Hgb A1c Recent Labs    06/12/18 1508  HGBA1C 7.4*   Lipid Profile No results for input(s): CHOL, HDL, LDLCALC, TRIG, CHOLHDL, LDLDIRECT in the last 72 hours. Thyroid function studies Recent Labs    06/12/18 1508  TSH 1.66   Anemia work  up No results for input(s): VITAMINB12, FOLATE, FERRITIN, TIBC, IRON, RETICCTPCT in the last 72 hours. Urinalysis    Component Value Date/Time   COLORURINE YELLOW 02/07/2018 1026   APPEARANCEUR CLEAR 02/07/2018 1026   LABSPEC 1.025 02/07/2018 1026   PHURINE 6.0 02/07/2018 1026   GLUCOSEU NEGATIVE 02/07/2018 1026   HGBUR NEGATIVE 02/07/2018 1026   BILIRUBINUR neg 03/21/2018 0940   KETONESUR NEGATIVE 02/07/2018 1026   PROTEINUR neg 03/21/2018 0940   PROTEINUR NEGATIVE 12/19/2015 1206   UROBILINOGEN 0.2 03/21/2018 0940   UROBILINOGEN 2.0 (A) 02/07/2018 1026   NITRITE neg 03/21/2018 0940   NITRITE NEGATIVE 02/07/2018 1026   LEUKOCYTESUR Negative 03/21/2018 0940   Sepsis Labs Invalid input(s): PROCALCITONIN,  WBC,  LACTICIDVEN Microbiology No results found for this or any previous visit (from the past 240 hour(s)).   Patient was seen and examined on the day of discharge and was found to be in stable condition. Time coordinating discharge: 25 minutes including assessment and coordination of care, as well as examination of the patient.   SIGNED:  Dessa Phi, DO [facility] Pager [phone]  If 7PM-7AM, please contact night-coverage [email or web address] Password [facility] 06/15/2018, 11:33 AM

## 2018-06-15 NOTE — Progress Notes (Signed)
  Progress Note: General Surgery Service   Assessment/Plan: Patient Active Problem List   Diagnosis Date Noted  . Choledocholithiasis 06/14/2018  . Cholelithiasis 06/13/2018  . Gallstones and inflammation of gallbladder without obstruction 06/12/2018  . Chronic idiopathic constipation 06/12/2018  . Diabetic infection of right foot ([place]) 07/12/2017  . Foot ulcer, right, with unspecified severity ([place]) 01/17/2017  . Complex sleep apnea syndrome 01/12/2016  . Ex-smoker 01/01/2016  . Diabetes mellitus type 2, uncontrolled, with complications ([place]) 25/85/2778  . OSA (obstructive sleep apnea) 12/18/2015  . Benign prostatic hyperplasia 02/18/2014  . Routine general medical examination at a health care facility 02/18/2014  . PAD (peripheral artery disease) ([place]) 02/18/2014  . DEGENERATIVE JOINT DISEASE 02/20/2008  . Diabetes mellitus type 2 with atherosclerosis of arteries of extremities ([place]) 01/26/2008  . Hyperlipidemia with target LDL less than 70 01/26/2008  . Obesity 01/26/2008  . Mitral valve disorder 01/26/2008  . GERD 01/26/2008  . Irritable bowel syndrome 01/26/2008   s/p Procedure(s): LAPAROSCOPIC CHOLECYSTECTOMY WITH INTRAOPERATIVE CHOLANGIOGRAM LAPAROSCOPIC LYSIS OF ADHESIONS 06/14/2018 -doing well, ok to discharge from surgery standpoint    LOS: 2 days  Chief Complaint/Subjective: Some soreness, tolerating diet  Objective: Vital signs in last 24 hours: Temp:  [97.9 F (36.6 C)-99 F (37.2 C)] 98.7 F (37.1 C) (08/01 0454) Pulse Rate:  [66-79] 67 (08/01 0454) Resp:  [15-22] 19 (07/31 2040) BP: (136-149)/(64-79) 136/65 (08/01 0454) SpO2:  [87 %-100 %] 98 % (08/01 0454) Last BM Date: 06/13/18  Intake/Output from previous day: 07/31 0701 - 08/01 0700 In: 1960 [P.O.:560; I.V.:1400] Out: 300 [Urine:300] Intake/Output this shift: No intake/output data recorded. Abd: soft, NT, ND, incisions c/d/i  Extremities: no edema  Neuro: AOx4  Lab Results: CBC  Recent  Labs    06/14/18 0541 06/15/18 0535  WBC 3.8* 8.4  HGB 11.2* 11.8*  HCT 33.7* 35.9*  PLT 146* 177   BMET Recent Labs    06/14/18 0541 06/14/18 2135 06/15/18 0535  NA 142  --  139  K 3.8  --  4.1  CL 104  --  103  CO2 30  --  27  GLUCOSE 176* 366* 224*  BUN 15  --  17  CREATININE 1.02  --  0.95  CALCIUM 8.6*  --  8.7*   PT/INR No results for input(s): LABPROT, INR in the last 72 hours. ABG No results for input(s): PHART, HCO3 in the last 72 hours.  Invalid input(s): PCO2, PO2  Studies/Results:  Anti-infectives: Anti-infectives (From admission, onward)   Start     Dose/Rate Route Frequency Ordered Stop   06/13/18 2230  cefTRIAXone (ROCEPHIN) 2 g in sodium chloride 0.9 % 100 mL IVPB  Status:  Discontinued     2 g 200 mL/hr over 30 Minutes Intravenous Every 24 hours 06/13/18 2213 06/14/18 1425   06/13/18 2230  metroNIDAZOLE (FLAGYL) IVPB 500 mg  Status:  Discontinued     500 mg 100 mL/hr over 60 Minutes Intravenous Every 8 hours 06/13/18 2213 06/14/18 1603      Medications: Scheduled Meds: . docusate sodium  100 mg Oral BID  . insulin aspart  0-9 Units Subcutaneous TID WC  . linaclotide  145 mcg Oral QAC breakfast  . simvastatin  40 mg Oral QPM   Continuous Infusions: . sodium chloride 500 mL (06/14/18 0055)   PRN Meds:.sodium chloride, acetaminophen **OR** acetaminophen, HYDROcodone-acetaminophen, morphine injection, ondansetron **OR** ondansetron (ZOFRAN) IV  Mickeal Skinner, MD Pg# [phone] [facility]

## 2018-06-16 ENCOUNTER — Telehealth: Payer: Self-pay

## 2018-06-16 NOTE — Telephone Encounter (Signed)
Transition Care Management Follow-up Telephone Call   Date discharged? 06/15/2018   How have you been since you were released from the hospital? Pt is feeling much better.   Do you understand why you were in the hospital? Yes  Do you understand the discharge instructions? Yes  Where were you discharged to? Home  Items Reviewed:  Medications reviewed: Yes  Allergies reviewed: Yes  Dietary changes reviewed: Yes  Referrals reviewed: Yes  Functional Questionnaire:   Activities of Daily Living (ADLs):   States they are independent in the following: All ADL's except for some stiffness that he feels.  States they require assistance with the following: A little unsteady at times.    Any transportation issues/concerns?: No  Any patient concerns? Not at this time  Confirmed importance and date/time of follow-up visits scheduled: Yes  Provider Appointment booked with Dr. Ronnald Ramp on 07/04/2018 at 01:30pm  Confirmed with patient if condition begins to worsen call PCP or go to the ER.  Patient was given the office number and encouraged to call back with question or concerns: Yes

## 2018-06-21 ENCOUNTER — Other Ambulatory Visit (INDEPENDENT_AMBULATORY_CARE_PROVIDER_SITE_OTHER): Payer: Medicare HMO

## 2018-06-21 ENCOUNTER — Encounter: Payer: Self-pay | Admitting: Internal Medicine

## 2018-06-21 ENCOUNTER — Ambulatory Visit (INDEPENDENT_AMBULATORY_CARE_PROVIDER_SITE_OTHER): Payer: Medicare HMO | Admitting: Internal Medicine

## 2018-06-21 VITALS — BP 110/60 | HR 67 | Temp 98.2°F | Ht >= 80 in | Wt 298.0 lb

## 2018-06-21 DIAGNOSIS — G4452 New daily persistent headache (NDPH): Secondary | ICD-10-CM

## 2018-06-21 LAB — COMPREHENSIVE METABOLIC PANEL
ALBUMIN: 3.8 g/dL (ref 3.5–5.2)
ALT: 66 U/L — ABNORMAL HIGH (ref 0–53)
AST: 22 U/L (ref 0–37)
Alkaline Phosphatase: 76 U/L (ref 39–117)
BUN: 13 mg/dL (ref 6–23)
CHLORIDE: 100 meq/L (ref 96–112)
CO2: 33 mEq/L — ABNORMAL HIGH (ref 19–32)
Calcium: 9.4 mg/dL (ref 8.4–10.5)
Creatinine, Ser: 0.99 mg/dL (ref 0.40–1.50)
GFR: 94.45 mL/min (ref >60.00)
GLUCOSE: 220 mg/dL — AB (ref 70–99)
Potassium: 4.6 mEq/L (ref 3.5–5.1)
SODIUM: 136 meq/L (ref 135–145)
Total Bilirubin: 1.8 mg/dL — ABNORMAL HIGH (ref 0.2–1.2)
Total Protein: 7.2 g/dL (ref 6.0–8.3)

## 2018-06-21 LAB — CBC
HCT: 36.2 % — ABNORMAL LOW (ref 39.0–52.0)
Hemoglobin: 12.2 g/dL — ABNORMAL LOW (ref 13.0–17.0)
MCHC: 33.7 g/dL (ref 30.0–36.0)
MCV: 94.5 fl (ref 78.0–100.0)
PLATELETS: 239 10*3/uL (ref 150.0–400.0)
RBC: 3.83 Mil/uL — ABNORMAL LOW (ref 4.22–5.81)
RDW: 12.3 % (ref 11.5–15.5)
WBC: 6.5 10*3/uL (ref 4.0–10.5)

## 2018-06-21 LAB — SEDIMENTATION RATE: SED RATE: 39 mm/h — AB (ref 0–20)

## 2018-06-21 LAB — LIPASE: LIPASE: 24 U/L (ref 11.0–59.0)

## 2018-06-21 NOTE — Progress Notes (Signed)
   Subjective:    Patient ID: Jason [name], male    DOB: 1941/06/16, 77 y.o.   MRN: [id number]  HPI The patient is a 77 YO man coming in for headaches. They are going on daily for the last 2-3 days. Has tried hydrocodone yesterday which took away the headache and it has not returned. They checked his BP at home which was elevated to 190s/100 which is not usual for him. He normally does not take blood pressure medicine. Denies numbness, weakness, change, in speech, drooping face. Denies change in medications recently. Describes as pain at the top of the head and by his ears. No recent falls or injury. Has OSA, PAD, DM type 2 uncontrolled per problem list. Gallbladder removal on 06/14/18 and headaches started 2-3 days after the surgery. He has had higher blood sugars since being home after the surgery to 200s-300s. He is still taking metformin. Diet has been different.   Review of Systems  Constitutional: Negative.   HENT: Negative.   Eyes: Negative.   Respiratory: Negative for cough, chest tightness and shortness of breath.   Cardiovascular: Negative for chest pain, palpitations and leg swelling.  Gastrointestinal: Negative for abdominal distention, abdominal pain, constipation, diarrhea, nausea and vomiting.  Musculoskeletal: Negative.   Skin: Negative.   Neurological: Positive for headaches.  Psychiatric/Behavioral: Negative.       Objective:   Physical Exam  Constitutional: He is oriented to person, place, and time. He appears well-developed and well-nourished.  HENT:  Head: Normocephalic and atraumatic.  No temporal tenderness.  Eyes: EOM are normal.  Neck: Normal range of motion.  Cardiovascular: Normal rate and regular rhythm.  Pulmonary/Chest: Effort normal and breath sounds normal. No respiratory distress. He has no wheezes. He has no rales.  Abdominal: Soft. Bowel sounds are normal. He exhibits no distension. There is no tenderness. There is no rebound.  Musculoskeletal: He  exhibits no edema.  Neurological: He is alert and oriented to person, place, and time. Coordination normal.  Skin: Skin is warm and dry.  Psychiatric: He has a normal mood and affect.   Vitals:   06/21/18 1106  BP: 110/60  Pulse: 67  Temp: 98.2 F (36.8 C)  TempSrc: Oral  SpO2: 97%  Weight: 298 lb (135.2 kg)  Height: 6\' 8"  (2.032 m)      Assessment & Plan:

## 2018-06-21 NOTE — Patient Instructions (Signed)
We will check the blood work today and call you back about the results.   Let us know if the headache comes back and we can order a CT scan of the head.   Work on increasing water to help bring the sugars down.

## 2018-06-22 DIAGNOSIS — G4452 New daily persistent headache (NDPH): Secondary | ICD-10-CM | POA: Insufficient documentation

## 2018-06-22 NOTE — Assessment & Plan Note (Addendum)
Headache is currently not present. BP is currently normal. Suspect due to high blood pressure after surgery. No indication of stroke or symptoms of stroke today. Suspect high blood sugars could have been related and asked to drink more fluids. He declines temporary increase in metformin dosing to 2 pills BID. Checking ESR, CBC, CMP.

## 2018-06-26 ENCOUNTER — Ambulatory Visit: Payer: Medicare HMO | Admitting: Podiatry

## 2018-06-26 DIAGNOSIS — E0842 Diabetes mellitus due to underlying condition with diabetic polyneuropathy: Secondary | ICD-10-CM | POA: Diagnosis not present

## 2018-06-26 DIAGNOSIS — L97512 Non-pressure chronic ulcer of other part of right foot with fat layer exposed: Secondary | ICD-10-CM

## 2018-06-26 DIAGNOSIS — I70235 Atherosclerosis of native arteries of right leg with ulceration of other part of foot: Secondary | ICD-10-CM | POA: Diagnosis not present

## 2018-06-26 MED ORDER — SULFAMETHOXAZOLE-TRIMETHOPRIM 800-160 MG PO TABS: 1.0000 | ORAL_TABLET | Freq: Two times a day (BID) | ORAL | 0 refills | Status: DC

## 2018-06-26 NOTE — Patient Instructions (Signed)
Pre-Operative Instructions  Congratulations, you have decided to take an important step towards improving your quality of life.  You can be assured that the doctors and staff at [facility] will be with you every step of the way.  Here are some important things you should know:  1. Plan to be at the surgery center/hospital at least 1 (one) hour prior to your scheduled time, unless otherwise directed by the surgical center/hospital staff.  You must have a responsible adult accompany you, remain during the surgery and drive you home.  Make sure you have directions to the surgical center/hospital to ensure you arrive on time. 2. If you are having surgery at [facility] or [facility], you will need a copy of your medical history and physical form from your family physician within one month prior to the date of surgery. We will give you a form for your primary physician to complete.  3. We make every effort to accommodate the date you request for surgery.  However, there are times where surgery dates or times have to be moved.  We will contact you as soon as possible if a change in schedule is required.   4. No aspirin/ibuprofen for one week before surgery.  If you are on aspirin, any non-steroidal anti-inflammatory medications (Mobic, Aleve, Ibuprofen) should not be taken seven (7) days prior to your surgery.  You make take Tylenol for pain prior to surgery.  5. Medications - If you are taking daily heart and blood pressure medications, seizure, reflux, allergy, asthma, anxiety, pain or diabetes medications, make sure you notify the surgery center/hospital before the day of surgery so they can tell you which medications you should take or avoid the day of surgery. 6. No food or drink after midnight the night before surgery unless directed otherwise by surgical center/hospital staff. 7. No alcoholic beverages 24-hours prior to surgery.  No smoking 24-hours prior or 24-hours after  surgery. 8. Wear loose pants or shorts. They should be loose enough to fit over bandages, boots, and casts. 9. Don't wear slip-on shoes. Sneakers are preferred. 10. Bring your boot with you to the surgery center/hospital.  Also bring crutches or a walker if your physician has prescribed it for you.  If you do not have this equipment, it will be provided for you after surgery. 11. If you have not been contacted by the surgery center/hospital by the day before your surgery, call to confirm the date and time of your surgery. 12. Leave-time from work may vary depending on the type of surgery you have.  Appropriate arrangements should be made prior to surgery with your employer. 13. Prescriptions will be provided immediately following surgery by your doctor.  Fill these as soon as possible after surgery and take the medication as directed. Pain medications will not be refilled on weekends and must be approved by the doctor. 14. Remove nail polish on the operative foot and avoid getting pedicures prior to surgery. 15. Wash the night before surgery.  The night before surgery wash the foot and leg well with water and the antibacterial soap provided. Be sure to pay special attention to beneath the toenails and in between the toes.  Wash for at least three (3) minutes. Rinse thoroughly with water and dry well with a towel.  Perform this wash unless told not to do so by your physician.  Enclosed: 1 Ice pack (please put in freezer the night before surgery)   1 Hibiclens skin cleaner     Pre-op instructions  If you have any questions regarding the instructions, please do not hesitate to call our office.  [place]: [address], [place], [place] [address] -- [phone]  [place]: [address] [place], [place] [address] -- [phone]  [place]: [address]  [place], [place] [address] -- [phone]  [place]: [address], [address], [place], [place] [address] -- [phone]  Website:  [email or web address] 

## 2018-06-27 NOTE — Progress Notes (Addendum)
Subjective:  77 year old male with PMHx of T2DM presenting today for follow up evaluation of an ulceration to the right foot. He states the wound has not really changed since his previous visit. He has been applying Iodine as directed. He is interested in discussing surgery at this time. Patient is here for further evaluation and treatment.    Past Medical History:  Diagnosis Date  . Anxiety   . Asthma    in past, no inhaler now  . Benign neoplasm of colon   . Depression   . Diverticulosis of colon (without mention of hemorrhage)   . DJD (degenerative joint disease)   . Esophageal reflux   . Glaucoma   . Hidradenitis   . History of BPH   . History of gynecomastia    Unilateral  . Hyperlipidemia   . Irritable bowel syndrome   . Memory loss   . MVP (mitral valve prolapse)   . Obesity, unspecified   . Sleep apnea    does not use  cpap or bipap .. no study  ever  . Type II or unspecified type diabetes mellitus without mention of complication, not stated as uncontrolled   . Unspecified venous (peripheral) insufficiency     Objective/Physical Exam General: The patient is alert and oriented x3 in no acute distress.  Dermatology:  Wound #1 noted to the right foot measuring 1.0 x 1.0 x 0.5 cm (LxWxD).   To the noted ulceration(s), there is no eschar. There is a moderate amount of slough, fibrin, and necrotic tissue noted. Granulation tissue and wound base is red. There is a minimal amount of serosanguineous drainage noted. There is no exposed bone muscle-tendon ligament or joint. There is malodor noted. Periwound integrity is intact. Skin is warm, dry and supple bilateral lower extremities.  Vascular: Palpable pedal pulses bilaterally. No edema or erythema noted. Capillary refill within normal limits.  Neurological: Epicritic and protective threshold absent bilaterally.   Musculoskeletal Exam: Range of motion within normal limits to all pedal and ankle joints bilateral. Muscle  strength 5/5 in all groups bilateral.   Assessment: #1 ulceration of the right foot secondary to diabetes mellitus #2 h/o toe amputations 1-3 right  #3 diabetes mellitus w/ peripheral neuropathy   Plan of Care:  #1 Patient was evaluated. #2 Medically necessary excisional debridement including muscle and deep fascial tissue was performed using a tissue nipper and a chisel blade. Excisional debridement of all the necrotic nonviable tissue down to healthy bleeding viable tissue was performed with post-debridement measurements same as pre-. #3 The wound was cleansed and dry sterile dressing applied. #4 Culture taken from wound.  #5 Prescription for Bactrim DS provided to patient.  #6 Today we discussed the conservative versus surgical management of the presenting pathology. The patient opts for surgical management. All possible complications and details of the procedure were explained. All patient questions were answered. No guarantees were expressed or implied. #7 Authorization for surgery was initiated today. Surgery will consist of metatarsal head resection right foot; debridement of ulceration of right foot.  #8 Return to clinic one week post op.    Edrick Kins, DPM [facility]  Dr. Edrick Kins, [address]                                        [place], [place] [address]  Office [phone]  Fax [phone]

## 2018-06-28 NOTE — Patient Instructions (Addendum)
GERSHON SHORTEN  06/28/2018   Your procedure is scheduled XB:WIOMBT 07/07/2018  Report to [facility] Main  Entrance              Report to admitting at  0530 AM    Call this number if you have problems the morning of surgery [phone]    Remember: Do not eat food or drink liquids :After Midnight.  How to Manage Your Diabetes Before and After Surgery  Why is it important to control my blood sugar before and after surgery? . Improving blood sugar levels before and after surgery helps healing and can limit problems. . A way of improving blood sugar control is eating a healthy diet by: o  Eating less sugar and carbohydrates o  Increasing activity/exercise o  Talking with your doctor about reaching your blood sugar goals . High blood sugars (greater than 180 mg/dL) can raise your risk of infections and slow your recovery, so you will need to focus on controlling your diabetes during the weeks before surgery. . Make sure that the doctor who takes care of your diabetes knows about your planned surgery including the date and location.  How do I manage my blood sugar before surgery? . Check your blood sugar at least 4 times a day, starting 2 days before surgery, to make sure that the level is not too high or low. o Check your blood sugar the morning of your surgery when you wake up and every 2 hours until you get to the Short Stay unit. . If your blood sugar is less than 70 mg/dL, you will need to treat for low blood sugar: o Do not take insulin. o Treat a low blood sugar (less than 70 mg/dL) with  cup of clear juice (cranberry or apple), 4 glucose tablets, OR glucose gel. o Recheck blood sugar in 15 minutes after treatment (to make sure it is greater than 70 mg/dL). If your blood sugar is not greater than 70 mg/dL on recheck, call [phone] for further instructions. . Report your blood sugar to the short stay nurse when you get to Short Stay.  . If you are  admitted to the hospital after surgery: o Your blood sugar will be checked by the staff and you will probably be given insulin after surgery (instead of oral diabetes medicines) to make sure you have good blood sugar levels. o The goal for blood sugar control after surgery is 80-180 mg/dL.   WHAT DO I DO ABOUT MY DIABETES MEDICATION?       The day before surgery, take Glimepiride (Amaryl) morning dose.        The day before surgery ,take Metformin as usual.         The day before surgery, take Ertugliflozin-Pyroglutamic as usual.  . Do not take oral diabetes medicines (pills) the morning of surgery.        Take these medicines the morning of surgery with A SIP OF WATER: use Albuterol inhaler if needed and bring it with you to the hospital  DO NOT [place] may not have any metal on your body including hair pins and  piercings  Do not wear jewelry, make-up, lotions, powders or perfumes, deodorant                        Men may shave face and neck.   Do not bring valuables to the hospital. [place].  Contacts, dentures or bridgework may not be worn into surgery.  Leave suitcase in the car. After surgery it may be brought to your room.     Patients discharged the day of surgery will not be allowed to drive home.  Name and phone number of your driver:               Please read over the following fact sheets you were given: _____________________________________________________________________             [facility] - Preparing for Surgery Before surgery, you can play an important role.  Because skin is not sterile, your skin needs to be as free of germs as possible.  You can reduce the number of germs on your skin by washing with CHG (chlorahexidine gluconate) soap before surgery.  CHG is an antiseptic cleaner which kills germs and bonds with the  skin to continue killing germs even after washing. Please DO NOT use if you have an allergy to CHG or antibacterial soaps.  If your skin becomes reddened/irritated stop using the CHG and inform your nurse when you arrive at Short Stay. Do not shave (including legs and underarms) for at least 48 hours prior to the first CHG shower.  You may shave your face/neck. Please follow these instructions carefully:  1.  Shower with CHG Soap the night before surgery and the  morning of Surgery.  2.  If you choose to wash your hair, wash your hair first as usual with your  normal  shampoo.  3.  After you shampoo, rinse your hair and body thoroughly to remove the  shampoo.                           4.  Use CHG as you would any other liquid soap.  You can apply chg directly  to the skin and wash                       Gently with a scrungie or clean washcloth.  5.  Apply the CHG Soap to your body ONLY FROM THE NECK DOWN.   Do not use on face/ open                           Wound or open sores. Avoid contact with eyes, ears mouth and genitals (private parts).                       Wash face,  Genitals (private parts) with your normal soap.             6.  Wash thoroughly, paying special attention to the area where your surgery  will be performed.  7.  Thoroughly rinse your body with warm water from the neck down.  8.  DO NOT shower/wash with your normal soap after using and rinsing off  the CHG Soap.  9.  Pat yourself dry with a clean towel.            10.  Wear clean pajamas.            11.  Place clean sheets on your bed the night of your first shower and do not  sleep with pets. Day of Surgery : Do not apply any lotions/deodorants the morning of surgery.  Please wear clean clothes to the hospital/surgery center.  FAILURE TO FOLLOW THESE INSTRUCTIONS MAY RESULT IN THE CANCELLATION OF YOUR SURGERY PATIENT SIGNATURE_________________________________  NURSE  SIGNATURE__________________________________  ________________________________________________________________________

## 2018-06-30 ENCOUNTER — Encounter (HOSPITAL_COMMUNITY): Payer: Self-pay | Admitting: Emergency Medicine

## 2018-06-30 ENCOUNTER — Other Ambulatory Visit: Payer: Self-pay

## 2018-06-30 ENCOUNTER — Emergency Department (HOSPITAL_COMMUNITY)
Admission: EM | Admit: 2018-06-30 | Discharge: 2018-06-30 | Disposition: A | Discharge status: Home / Self Care | Payer: Medicare HMO | Attending: Emergency Medicine | Admitting: Emergency Medicine

## 2018-06-30 ENCOUNTER — Inpatient Hospital Stay (HOSPITAL_COMMUNITY)
Admission: RE | Admit: 2018-06-30 | Discharge: 2018-06-30 | Disposition: A | Discharge status: Home / Self Care | Payer: Medicare HMO | Source: Ambulatory Visit

## 2018-06-30 ENCOUNTER — Telehealth: Payer: Self-pay | Admitting: Podiatry

## 2018-06-30 DIAGNOSIS — Z79899 Other long term (current) drug therapy: Secondary | ICD-10-CM | POA: Diagnosis not present

## 2018-06-30 DIAGNOSIS — Z7984 Long term (current) use of oral hypoglycemic drugs: Secondary | ICD-10-CM | POA: Insufficient documentation

## 2018-06-30 DIAGNOSIS — Z87891 Personal history of nicotine dependence: Secondary | ICD-10-CM | POA: Insufficient documentation

## 2018-06-30 DIAGNOSIS — J45909 Unspecified asthma, uncomplicated: Secondary | ICD-10-CM | POA: Diagnosis not present

## 2018-06-30 DIAGNOSIS — Z7982 Long term (current) use of aspirin: Secondary | ICD-10-CM | POA: Diagnosis not present

## 2018-06-30 DIAGNOSIS — E119 Type 2 diabetes mellitus without complications: Secondary | ICD-10-CM | POA: Insufficient documentation

## 2018-06-30 DIAGNOSIS — R21 Rash and other nonspecific skin eruption: Secondary | ICD-10-CM | POA: Diagnosis not present

## 2018-06-30 MED ORDER — DIPHENHYDRAMINE HCL 25 MG PO CAPS
25.0000 mg | ORAL_CAPSULE | Freq: Once | ORAL | Status: AC
  Administered 2018-06-30: 25 mg via ORAL
  Filled 2018-06-30: qty 1

## 2018-06-30 MED ORDER — DEXAMETHASONE SODIUM PHOSPHATE 10 MG/ML IJ SOLN
10.0000 mg | Freq: Once | INTRAMUSCULAR | Status: AC
  Administered 2018-06-30: 10 mg via INTRAMUSCULAR
  Filled 2018-06-30: qty 1

## 2018-06-30 MED ORDER — FAMOTIDINE 20 MG PO TABS: 20.0000 mg | ORAL_TABLET | Freq: Two times a day (BID) | ORAL | 0 refills | Status: DC

## 2018-06-30 MED ORDER — DOXYCYCLINE HYCLATE 100 MG PO TABS: 100.0000 mg | ORAL_TABLET | Freq: Two times a day (BID) | ORAL | 0 refills | Status: DC

## 2018-06-30 MED ORDER — FAMOTIDINE 20 MG PO TABS
20.0000 mg | ORAL_TABLET | Freq: Once | ORAL | Status: AC
  Administered 2018-06-30: 20 mg via ORAL
  Filled 2018-06-30: qty 1

## 2018-06-30 NOTE — Telephone Encounter (Signed)
I'm having an reactions to the medication he prescribed to me? I'm breaking out in red patches. I need to know what to do other than stop taking the medications. Please call me back as soon as possible at [phone]. Thank you, have a blessed day. Looking for a follow up soon.

## 2018-06-30 NOTE — Discharge Instructions (Addendum)
Take Benadryl over-the-counter as directed.  Follow-up with your doctor.  Return here as needed.

## 2018-06-30 NOTE — ED Triage Notes (Signed)
Pt c/o bumps on bilat leg and left index finger for couple days that itch.

## 2018-06-30 NOTE — Telephone Encounter (Signed)
Spoke with patient's wife, informing her to tell patient to d/c Bactrim and that we would be back in touch with additional orders/ alternative medications.

## 2018-06-30 NOTE — ED Provider Notes (Signed)
[facility] DEPT Provider Note   CSN: [id number] Arrival date & time: 06/30/18  1028     History   Chief Complaint Chief Complaint  Patient presents with  . Rash    HPI Jason [name] is a 77 y.o. male.  HPI Patient presents to the emergency department with rash that started 3 days ago.  The patient states that he was recently placed on Bactrim and felt that that may be a cause.  The patient states he also did change detergents last week.  Patient has a rash to the arms chest abdomen and legs.  The patient denies chest pain, shortness of breath, headache,blurred vision, neck pain, fever, cough, weakness, numbness, dizziness, anorexia, edema, abdominal pain, nausea, vomiting, diarrhea, rash, back pain, dysuria, hematemesis, bloody stool, near syncope, or syncope. Past Medical History:  Diagnosis Date  . Anxiety   . Asthma    in past, no inhaler now  . Benign neoplasm of colon   . Depression   . Diverticulosis of colon (without mention of hemorrhage)   . DJD (degenerative joint disease)   . Esophageal reflux   . Glaucoma   . Hidradenitis   . History of BPH   . History of gynecomastia    Unilateral  . Hyperlipidemia   . Irritable bowel syndrome   . Memory loss   . MVP (mitral valve prolapse)   . Obesity, unspecified   . Sleep apnea    does not use  cpap or bipap .. no study  ever  . Type II or unspecified type diabetes mellitus without mention of complication, not stated as uncontrolled   . Unspecified venous (peripheral) insufficiency     Patient Active Problem List   Diagnosis Date Noted  . New daily persistent headache 06/22/2018  . Choledocholithiasis 06/14/2018  . Cholelithiasis 06/13/2018  . Gallstones and inflammation of gallbladder without obstruction 06/12/2018  . Chronic idiopathic constipation 06/12/2018  . Diabetic infection of right foot ([place]) 07/12/2017  . Foot ulcer, right, with unspecified severity ([place]) 01/17/2017  .  Complex sleep apnea syndrome 01/12/2016  . Ex-smoker 01/01/2016  . Diabetes mellitus type 2, uncontrolled, with complications ([place]) 62/70/3500  . OSA (obstructive sleep apnea) 12/18/2015  . Benign prostatic hyperplasia 02/18/2014  . Routine general medical examination at a health care facility 02/18/2014  . PAD (peripheral artery disease) ([place]) 02/18/2014  . DEGENERATIVE JOINT DISEASE 02/20/2008  . Diabetes mellitus type 2 with atherosclerosis of arteries of extremities ([place]) 01/26/2008  . Hyperlipidemia with target LDL less than 70 01/26/2008  . Obesity 01/26/2008  . Mitral valve disorder 01/26/2008  . GERD 01/26/2008  . Irritable bowel syndrome 01/26/2008    Past Surgical History:  Procedure Laterality Date  . AMPUTATION  08/09/2012   Procedure: AMPUTATION DIGIT;  Surgeon: Newt Minion, MD;  Location: [facility];  Service: Orthopedics;  Laterality: Right;  Amputation right Great Toe MTP Joint  . AMPUTATION  10/17/2012   Procedure: AMPUTATION DIGIT;  Surgeon: Newt Minion, MD;  Location: [facility];  Service: Orthopedics;  Laterality: Right;  Right Foot 3rd Toe Amputation at Metatarsophalangeal Joint  . CHOLECYSTECTOMY N/A 06/14/2018   Procedure: LAPAROSCOPIC CHOLECYSTECTOMY WITH INTRAOPERATIVE CHOLANGIOGRAM;  Surgeon: Kieth Brightly Arta Bruce, MD;  Location: [facility];  Service: General;  Laterality: N/A;  . CIRCUMCISION  06/2100   For Phimosis - Dr Hartley Barefoot  . COLON SURGERY    . KNEE ARTHROSCOPY    . LAPAROSCOPIC LYSIS OF ADHESIONS  06/14/2018   Procedure: LAPAROSCOPIC  LYSIS OF ADHESIONS;  Surgeon: Kinsinger, Arta Bruce, MD;  Location: [facility];  Service: General;;  . LAPAROTOMY  07/20/11  . LASER ABLATION  06/2009   Left Greater Saphenous vein -Dr Donnetta Hutching  . TOE AMPUTATION  06/2009   x3Right foot second toe -Dr Beola Cord  . TONSILLECTOMY          Home Medications    Prior to Admission medications   Medication Sig Start Date End Date Taking? Authorizing Provider  albuterol (PROVENTIL) (2.5  MG/3ML) 0.083% nebulizer solution Take 3 mLs (2.5 mg total) by nebulization every 4 (four) hours as needed for wheezing. 11/18/15  Yes [provider]  aspirin EC 81 MG tablet Take 1 tablet (81 mg total) by mouth daily. 11/14/14  Yes [name], MD  blood glucose meter kit and supplies KIT Use to test blood sugar up to 3 times daily. DX E11.9 02/05/16  Yes [name], MD  Blood Glucose Monitoring Suppl (TRUE METRIX METER) w/Device KIT Use to check blood sugar DX E11.8 02/13/16  Yes [name], MD  doxycycline (VIBRA-TABS) 100 MG tablet Take 1 tablet (100 mg total) by mouth 2 (two) times daily. 06/30/18  Yes Edrick Kins, DPM  glimepiride (AMARYL) 2 MG tablet TAKE 1 TABLET EVERY DAY WITH BREAKFAST Patient taking differently: Take 2 mg by mouth daily with breakfast.  04/06/18  Yes [name], MD  metFORMIN (GLUCOPHAGE) 500 MG tablet TAKE 1 TABLET TWICE DAILY WITH MEALS Patient taking differently: Take 1,000 mg by mouth 2 (two) times daily.  04/04/18  Yes [name], MD  simvastatin (ZOCOR) 40 MG tablet Take 40 mg by mouth every evening. 06/20/18  Yes [provider]  sulfamethoxazole-trimethoprim (BACTRIM DS,SEPTRA DS) 800-160 MG tablet Take 1 tablet by mouth 2 (two) times daily. 06/26/18  Yes Edrick Kins, DPM  TRUE METRIX BLOOD GLUCOSE TEST test strip USE  TO TEST BLOOD SUGAR  UP  TO THREE TIMES DAILY 05/10/17  Yes [name], MD  TRUEPLUS LANCETS 33G MISC USE  TO TEST BLOOD SUGAR  UP  TO THREE TIMES DAILY 06/10/16  Yes [name], MD  vitamin C (ASCORBIC ACID) 500 MG tablet Take 500 mg by mouth daily.  11/14/14  Yes [provider]  docusate sodium (COLACE) 100 MG capsule Take 1 capsule (100 mg total) by mouth 2 (two) times daily. Patient not taking: Reported on 06/26/2018 06/15/18   Dessa Phi, DO  HYDROcodone-acetaminophen (NORCO/VICODIN) 5-325 MG tablet Take 1 tablet by mouth every 6 (six) hours as needed for moderate pain. Patient not  taking: Reported on 06/30/2018 06/15/18   Kinsinger, Arta Bruce, MD  linaclotide [facility]) 145 MCG CAPS capsule Take 1 capsule (145 mcg total) by mouth daily before breakfast. Patient not taking: Reported on 06/26/2018 06/12/18   [name], MD  polyethylene glycol (MIRALAX / Floria Raveling) packet Take 17 g by mouth daily. Patient not taking: Reported on 06/26/2018 06/15/18   Dessa Phi, DO    Family History Family History  Problem Relation Age of Onset  . Diabetes Father   . Cancer Father        Prostate  . Stroke Father   . Prostate cancer Father   . Colon cancer Neg Hx   . Esophageal cancer Neg Hx   . Rectal cancer Neg Hx   . Stomach cancer Neg Hx     Social History Social History   Tobacco Use  . Smoking status: Former Smoker  Packs/day: 0.50    Years: 5.00    Pack years: 2.50    Types: Cigarettes    Last attempt to quit: 09/05/2015    Years since quitting: 2.8  . Smokeless tobacco: Never Used  . Tobacco comment: He quit in 1973.  Smoked for one year between 2015 and 2015  Substance Use Topics  . Alcohol use: Yes    Alcohol/week: 7.0 standard drinks    Types: 7 Cans of beer per week    Comment: not every week   . Drug use: No     Allergies   Bactrim [sulfamethoxazole-trimethoprim]   Review of Systems Review of Systems All other systems negative except as documented in the HPI. All pertinent positives and negatives as reviewed in the HPI.  Physical Exam Updated Vital Signs BP 113/64 (BP Location: Left Arm)   Pulse 71   Temp 98.2 F (36.8 C) (Oral)   Resp 18   SpO2 100%   Physical Exam  Constitutional: He is oriented to person, place, and time. He appears well-developed and well-nourished. No distress.  HENT:  Head: Normocephalic and atraumatic.  Eyes: Pupils are equal, round, and reactive to light.  Pulmonary/Chest: Effort normal.  Neurological: He is alert and oriented to person, place, and time.  Skin: Skin is warm and dry. Rash noted.  Patient  has multiple raised areas on both arms chest abdomen and legs that are red there is no vesicular component.  There is no sloughing of the skin and there is no skin breakdown.  Psychiatric: He has a normal mood and affect.  Nursing note and vitals reviewed.    ED Treatments / Results  Labs (all labs ordered are listed, but only abnormal results are displayed) Labs Reviewed - No data to display  EKG None  Radiology No results found.  Procedures Procedures (including critical care time)  Medications Ordered in ED Medications  diphenhydrAMINE (BENADRYL) capsule 25 mg (25 mg Oral Given 06/30/18 1330)  famotidine (PEPCID) tablet 20 mg (20 mg Oral Given 06/30/18 1330)     Initial Impression / Assessment and Plan / ED Course  I have reviewed the triage vital signs and the nursing notes.  Pertinent labs & imaging results that were available during my care of the patient were reviewed by me and considered in my medical decision making (see chart for details).    Patient will be treated for an allergic type rash and referred back to his primary doctor.  Told return here as needed.  The patient's vital signs remained stable.  I feel that this rash is not a significant or serious rash at this time.  Final Clinical Impressions(s) / ED Diagnoses   Final diagnoses:  None    ED Discharge Orders    None       Dalia Heading, PA-C 06/30/18 [place], [place], MD 06/30/18 1606

## 2018-06-30 NOTE — Telephone Encounter (Signed)
Prescription for doxycycline was sent over to patient's pharmacy.  He is to discontinue Bactrim due to medication reaction.

## 2018-07-04 ENCOUNTER — Encounter: Payer: Self-pay | Admitting: Internal Medicine

## 2018-07-04 ENCOUNTER — Ambulatory Visit (INDEPENDENT_AMBULATORY_CARE_PROVIDER_SITE_OTHER): Payer: Medicare HMO | Admitting: Internal Medicine

## 2018-07-04 VITALS — BP 120/60 | HR 75 | Temp 98.1°F | Ht >= 80 in | Wt 285.8 lb

## 2018-07-04 DIAGNOSIS — E1151 Type 2 diabetes mellitus with diabetic peripheral angiopathy without gangrene: Secondary | ICD-10-CM | POA: Diagnosis not present

## 2018-07-04 DIAGNOSIS — K2100 Gastro-esophageal reflux disease with esophagitis, without bleeding: Secondary | ICD-10-CM

## 2018-07-04 DIAGNOSIS — I70209 Unspecified atherosclerosis of native arteries of extremities, unspecified extremity: Secondary | ICD-10-CM | POA: Diagnosis not present

## 2018-07-04 DIAGNOSIS — K5904 Chronic idiopathic constipation: Secondary | ICD-10-CM | POA: Diagnosis not present

## 2018-07-04 DIAGNOSIS — C679 Malignant neoplasm of bladder, unspecified: Secondary | ICD-10-CM

## 2018-07-04 MED ORDER — DEXLANSOPRAZOLE 60 MG PO CPDR: 60.0000 mg | DELAYED_RELEASE_CAPSULE | Freq: Every day | ORAL | 1 refills | Status: DC

## 2018-07-04 MED ORDER — LINACLOTIDE 145 MCG PO CAPS: 145.0000 ug | ORAL_CAPSULE | Freq: Every day | ORAL | 1 refills | Status: DC

## 2018-07-04 NOTE — Progress Notes (Signed)
06/14/2018- noted in [facility]  06/12/2018-noted in Epic-HgA1C  06/09/2018- noted I Epic- CT abd./pelvis wo contrast  06/21/2018- noted in Epic-CBC,CMP,SED>RATE,Lipase  01/14/2016- noted in Epic-ECHO

## 2018-07-04 NOTE — Patient Instructions (Signed)
Gastroesophageal Reflux Disease, Adult Normally, food travels down the esophagus and stays in the stomach to be digested. However, when a person has gastroesophageal reflux disease (GERD), food and stomach acid move back up into the esophagus. When this happens, the esophagus becomes sore and inflamed. Over time, GERD can create small holes (ulcers) in the lining of the esophagus. What are the causes? This condition is caused by a problem with the muscle between the esophagus and the stomach (lower esophageal sphincter, or LES). Normally, the LES muscle closes after food passes through the esophagus to the stomach. When the LES is weakened or abnormal, it does not close properly, and that allows food and stomach acid to go back up into the esophagus. The LES can be weakened by certain dietary substances, medicines, and medical conditions, including:  Tobacco use.  Pregnancy.  Having a hiatal hernia.  Heavy alcohol use.  Certain foods and beverages, such as coffee, chocolate, onions, and peppermint.  What increases the risk? This condition is more likely to develop in:  People who have an increased body weight.  People who have connective tissue disorders.  People who use NSAID medicines.  What are the signs or symptoms? Symptoms of this condition include:  Heartburn.  Difficult or painful swallowing.  The feeling of having a lump in the throat.  Abitter taste in the mouth.  Bad breath.  Having a large amount of saliva.  Having an upset or bloated stomach.  Belching.  Chest pain.  Shortness of breath or wheezing.  Ongoing (chronic) cough or a night-time cough.  Wearing away of tooth enamel.  Weight loss.  Different conditions can cause chest pain. Make sure to see your health care provider if you experience chest pain. How is this diagnosed? Your health care provider will take a medical history and perform a physical exam. To determine if you have mild or severe  GERD, your health care provider may also monitor how you respond to treatment. You may also have other tests, including:  An endoscopy toexamine your stomach and esophagus with a small camera.  A test thatmeasures the acidity level in your esophagus.  A test thatmeasures how much pressure is on your esophagus.  A barium swallow or modified barium swallow to show the shape, size, and functioning of your esophagus.  How is this treated? The goal of treatment is to help relieve your symptoms and to prevent complications. Treatment for this condition may vary depending on how severe your symptoms are. Your health care provider may recommend:  Changes to your diet.  Medicine.  Surgery.  Follow these instructions at home: Diet  Follow a diet as recommended by your health care provider. This may involve avoiding foods and drinks such as: ? Coffee and tea (with or without caffeine). ? Drinks that containalcohol. ? Energy drinks and sports drinks. ? Carbonated drinks or sodas. ? Chocolate and cocoa. ? Peppermint and mint flavorings. ? Garlic and onions. ? Horseradish. ? Spicy and acidic foods, including peppers, chili powder, curry powder, vinegar, hot sauces, and barbecue sauce. ? Citrus fruit juices and citrus fruits, such as oranges, lemons, and limes. ? Tomato-based foods, such as red sauce, chili, salsa, and pizza with red sauce. ? Fried and fatty foods, such as donuts, french fries, potato chips, and high-fat dressings. ? High-fat meats, such as hot dogs and fatty cuts of red and white meats, such as rib eye steak, sausage, ham, and bacon. ? High-fat dairy items, such as whole milk,   butter, and cream cheese.  Eat small, frequent meals instead of large meals.  Avoid drinking large amounts of liquid with your meals.  Avoid eating meals during the 2-3 hours before bedtime.  Avoid lying down right after you eat.  Do not exercise right after you eat. General  instructions  Pay attention to any changes in your symptoms.  Take over-the-counter and prescription medicines only as told by your health care provider. Do not take aspirin, ibuprofen, or other NSAIDs unless your health care provider told you to do so.  Do not use any tobacco products, including cigarettes, chewing tobacco, and e-cigarettes. If you need help quitting, ask your health care provider.  Wear loose-fitting clothing. Do not wear anything tight around your waist that causes pressure on your abdomen.  Raise (elevate) the head of your bed 6 inches (15cm).  Try to reduce your stress, such as with yoga or meditation. If you need help reducing stress, ask your health care provider.  If you are overweight, reduce your weight to an amount that is healthy for you. Ask your health care provider for guidance about a safe weight loss goal.  Keep all follow-up visits as told by your health care provider. This is important. Contact a health care provider if:  You have new symptoms.  You have unexplained weight loss.  You have difficulty swallowing, or it hurts to swallow.  You have wheezing or a persistent cough.  Your symptoms do not improve with treatment.  You have a hoarse voice. Get help right away if:  You have pain in your arms, neck, jaw, teeth, or back.  You feel sweaty, dizzy, or light-headed.  You have chest pain or shortness of breath.  You vomit and your vomit looks like blood or coffee grounds.  You faint.  Your stool is bloody or black.  You cannot swallow, drink, or eat. This information is not intended to replace advice given to you by your health care provider. Make sure you discuss any questions you have with your health care provider. Document Released: 08/11/2005 Document Revised: 03/31/2016 Document Reviewed: 02/26/2015 Elsevier Interactive Patient Education  2018 [employer]  

## 2018-07-04 NOTE — Patient Instructions (Addendum)
Jason [name]  07/04/2018   Your procedure is scheduled on: Friday 07/07/2018  Report to [facility] Main  Entrance   Report to admitting at   0530 AM    Call this number if you have problems the morning of surgery [phone]   How to Manage Your Diabetes Before and After Surgery  Why is it important to control my blood sugar before and after surgery? . Improving blood sugar levels before and after surgery helps healing and can limit problems. . A way of improving blood sugar control is eating a healthy diet by: o  Eating less sugar and carbohydrates o  Increasing activity/exercise o  Talking with your doctor about reaching your blood sugar goals . High blood sugars (greater than 180 mg/dL) can raise your risk of infections and slow your recovery, so you will need to focus on controlling your diabetes during the weeks before surgery. . Make sure that the doctor who takes care of your diabetes knows about your planned surgery including the date and location.  How do I manage my blood sugar before surgery? . Check your blood sugar at least 4 times a day, starting 2 days before surgery, to make sure that the level is not too high or low. o Check your blood sugar the morning of your surgery when you wake up and every 2 hours until you get to the Short Stay unit. . If your blood sugar is less than 70 mg/dL, you will need to treat for low blood sugar: o Do not take insulin. o Treat a low blood sugar (less than 70 mg/dL) with  cup of clear juice (cranberry or apple), 4 glucose tablets, OR glucose gel. o Recheck blood sugar in 15 minutes after treatment (to make sure it is greater than 70 mg/dL). If your blood sugar is not greater than 70 mg/dL on recheck, call [phone] for further instructions. . Report your blood sugar to the short stay nurse when you get to Short Stay.  . If you are admitted to the hospital after surgery: o Your blood sugar will be checked  by the staff and you will probably be given insulin after surgery (instead of oral diabetes medicines) to make sure you have good blood sugar levels. o The goal for blood sugar control after surgery is 80-180 mg/dL.   WHAT DO I DO ABOUT MY DIABETES MEDICATION?          The day before surgery, Take Metformin as usual.        The day before surgery, Take Glimepiride as usual.        The day before surgery ,Take Ertugliflozin-Pyroglutamic Ac as usual.  . Do not take oral diabetes medicines (pills) the morning of surgery.  .       Remember: Do not eat food or drink liquids :After Midnight.     Take these medicines the morning of surgery with A SIP OF WATER: use Albuterol inhaler if needed and bring inhaler with you to the hospital.   DO NOT [place] may not have any metal on your body including hair pins and  piercings  Do not wear jewelry, make-up, lotions, powders or perfumes, deodorant                         Men may shave face and neck.   Do not bring valuables to the hospital. [place].  Contacts, dentures or bridgework may not be worn into surgery.  Leave suitcase in the car. After surgery it may be brought to your room.     Patients discharged the day of surgery will not be allowed to drive home.  Name and phone number of your driver:sister- Jason Griffins or son               Please read over the following fact sheets you were given: _____________________________________________________________________             [facility] - Preparing for Surgery Before surgery, you can play an important role.  Because skin is not sterile, your skin needs to be as free of germs as possible.  You can reduce the number of germs on your skin by washing with CHG (chlorahexidine gluconate) soap before surgery.  CHG is an antiseptic cleaner which kills germs and  bonds with the skin to continue killing germs even after washing. Please DO NOT use if you have an allergy to CHG or antibacterial soaps.  If your skin becomes reddened/irritated stop using the CHG and inform your nurse when you arrive at Short Stay. Do not shave (including legs and underarms) for at least 48 hours prior to the first CHG shower.  You may shave your face/neck. Please follow these instructions carefully:  1.  Shower with CHG Soap the night before surgery and the  morning of Surgery.  2.  If you choose to wash your hair, wash your hair first as usual with your  normal  shampoo.  3.  After you shampoo, rinse your hair and body thoroughly to remove the  shampoo.                           4.  Use CHG as you would any other liquid soap.  You can apply chg directly  to the skin and wash                       Gently with a scrungie or clean washcloth.  5.  Apply the CHG Soap to your body ONLY FROM THE NECK DOWN.   Do not use on face/ open                           Wound or open sores. Avoid contact with eyes, ears mouth and genitals (private parts).                       Wash face,  Genitals (private parts) with your normal soap.             6.  Wash thoroughly, paying special attention to the area where your surgery  will be performed.  7.  Thoroughly rinse your body with warm water from the neck down.  8.  DO NOT shower/wash with your normal soap after using and rinsing off  the CHG Soap.  9.  Pat yourself dry with a clean towel.            10.  Wear clean pajamas.            11.  Place clean sheets on your bed the night of your first shower and do not  sleep with pets. Day of Surgery : Do not apply any lotions/deodorants the morning of surgery.  Please wear clean clothes to the hospital/surgery center.  FAILURE TO FOLLOW THESE INSTRUCTIONS MAY RESULT IN THE CANCELLATION OF YOUR SURGERY PATIENT SIGNATURE_________________________________  NURSE  SIGNATURE__________________________________  ________________________________________________________________________

## 2018-07-04 NOTE — Progress Notes (Signed)
Subjective:  Patient ID: Jason [name], male    DOB: August 20, 1941  Age: 77 y.o. MRN: [id number]  CC: Gastroesophageal Reflux   HPI Jason [name] presents for f/up - He is recently s/p cholecystectomy and is doing much better.  He denies any recent episodes of abdominal pain, loss of appetite, nausea, vomiting, fever, or chills.  He continues to complain of constipation.  When I last saw him I prescribed Linzess but he tells me he is not taking it.  Instead he is using MiraLAX as needed.  He also complains of heartburn but he denies odynophagia or dysphagia.  He was seen in the ED about a week or 2 ago for a rash caused by a sulfa drug.  He says the rash and its associated symptoms have resolved.  Outpatient Medications Prior to Visit  Medication Sig Dispense Refill  . albuterol (PROVENTIL) (2.5 MG/3ML) 0.083% nebulizer solution Take 3 mLs (2.5 mg total) by nebulization every 4 (four) hours as needed for wheezing.    Marland Kitchen aspirin EC 81 MG tablet Take 1 tablet (81 mg total) by mouth daily. 90 tablet 3  . blood glucose meter kit and supplies KIT Use to test blood sugar up to 3 times daily. DX E11.9 1 each 0  . Blood Glucose Monitoring Suppl (TRUE METRIX METER) w/Device KIT Use to check blood sugar DX E11.8 1 kit 0  . docusate sodium (COLACE) 100 MG capsule Take 1 capsule (100 mg total) by mouth 2 (two) times daily. 10 capsule 0  . famotidine (PEPCID) 20 MG tablet Take 1 tablet (20 mg total) by mouth 2 (two) times daily. 10 tablet 0  . glimepiride (AMARYL) 2 MG tablet TAKE 1 TABLET EVERY DAY WITH BREAKFAST (Patient taking differently: Take 2 mg by mouth daily with breakfast. ) 90 tablet 1  . metFORMIN (GLUCOPHAGE) 500 MG tablet TAKE 1 TABLET TWICE DAILY WITH MEALS (Patient taking differently: Take 1,000 mg by mouth 2 (two) times daily. ) 180 tablet 1  . polyethylene glycol (MIRALAX / GLYCOLAX) packet Take 17 g by mouth daily. 14 each 0  . simvastatin (ZOCOR) 40 MG tablet Take 40 mg by mouth every  evening.    . TRUE METRIX BLOOD GLUCOSE TEST test strip USE  TO TEST BLOOD SUGAR  UP  TO THREE TIMES DAILY 300 each 3  . TRUEPLUS LANCETS 33G MISC USE  TO TEST BLOOD SUGAR  UP  TO THREE TIMES DAILY 300 each 3  . vitamin C (ASCORBIC ACID) 500 MG tablet Take 500 mg by mouth daily.     Marland Kitchen linaclotide (LINZESS) 145 MCG CAPS capsule Take 1 capsule (145 mcg total) by mouth daily before breakfast. 90 capsule 1  . doxycycline (VIBRA-TABS) 100 MG tablet Take 1 tablet (100 mg total) by mouth 2 (two) times daily. 20 tablet 0  . HYDROcodone-acetaminophen (NORCO/VICODIN) 5-325 MG tablet Take 1 tablet by mouth every 6 (six) hours as needed for moderate pain. (Patient not taking: Reported on 06/30/2018) 20 tablet 0  . sulfamethoxazole-trimethoprim (BACTRIM DS,SEPTRA DS) 800-160 MG tablet Take 1 tablet by mouth 2 (two) times daily. 28 tablet 0   No facility-administered medications prior to visit.     ROS Review of Systems  Constitutional: Negative for chills, diaphoresis, fatigue and fever.  HENT: Negative for trouble swallowing and voice change.   Respiratory: Negative for cough, chest tightness, shortness of breath and wheezing.   Cardiovascular: Negative for chest pain, palpitations and leg swelling.  Gastrointestinal: Positive for  constipation. Negative for abdominal distention, abdominal pain, blood in stool, diarrhea, nausea and vomiting.       ++ heartburn  Genitourinary: Negative.  Negative for difficulty urinating.  Musculoskeletal: Negative.  Negative for arthralgias and myalgias.  Skin: Negative.   Neurological: Negative.  Negative for dizziness, weakness and light-headedness.  Hematological: Negative for adenopathy. Does not bruise/bleed easily.  Psychiatric/Behavioral: Negative.     Objective:  BP 120/60 (BP Location: Left Arm, Patient Position: Sitting, Cuff Size: Large)   Pulse 75   Temp 98.1 F (36.7 C) (Oral)   Ht _0  (2.032 m)   Wt 285 lb 12 oz (129.6 kg)   SpO2 97%   BMI 31.39  kg/m   BP Readings from Last 3 Encounters:  07/04/18 120/60  06/30/18 113/64  06/21/18 110/60    Wt Readings from Last 3 Encounters:  07/04/18 285 lb 12 oz (129.6 kg)  06/21/18 298 lb (135.2 kg)  06/14/18 (!) 304 lb 6.4 oz (138.1 kg)    Physical Exam  Constitutional: He is oriented to person, place, and time. No distress.  HENT:  Mouth/Throat: Oropharynx is clear and moist. No oropharyngeal exudate.  Eyes: Conjunctivae are normal. No scleral icterus.  Neck: Normal range of motion. Neck supple. No JVD present. No thyromegaly present.  Cardiovascular: Normal rate, regular rhythm and normal heart sounds. Exam reveals no gallop and no friction rub.  No murmur heard. Pulmonary/Chest: Effort normal and breath sounds normal. He has no wheezes. He has no rhonchi. He has no rales.  Abdominal: Soft. Normal appearance. He exhibits no mass. Bowel sounds are decreased. There is no hepatosplenomegaly. There is no tenderness. No hernia.  Musculoskeletal: Normal range of motion. He exhibits no edema, tenderness or deformity.  Lymphadenopathy:    He has no cervical adenopathy.  Neurological: He is alert and oriented to person, place, and time.  Skin: Skin is warm and dry. No rash noted. He is not diaphoretic.  Vitals reviewed.   Lab Results  Component Value Date   WBC 6.5 06/21/2018   HGB 12.2 (L) 06/21/2018   HCT 36.2 (L) 06/21/2018   PLT 239.0 06/21/2018   GLUCOSE 220 (H) 06/21/2018   CHOL 121 02/07/2018   TRIG 66.0 02/07/2018   HDL 47.50 02/07/2018   LDLCALC 60 02/07/2018   ALT 66 (H) 06/21/2018   AST 22 06/21/2018   NA 136 06/21/2018   K 4.6 06/21/2018   CL 100 06/21/2018   CREATININE 0.99 06/21/2018   BUN 13 06/21/2018   CO2 33 (H) 06/21/2018   TSH 1.66 06/12/2018   PSA 0.63 03/21/2018   INR 1.05 10/17/2012   HGBA1C 7.4 (H) 06/12/2018   MICROALBUR 4.7 (H) 02/07/2018    No results found.  Assessment & Plan:   Denorris was seen today for gastroesophageal  reflux.  Diagnoses and all orders for this visit:  Gastroesophageal reflux disease with esophagitis -     dexlansoprazole (DEXILANT) 60 MG capsule; Take 1 capsule (60 mg total) by mouth daily.  Chronic idiopathic constipation -     linaclotide (LINZESS) 145 MCG CAPS capsule; Take 1 capsule (145 mcg total) by mouth daily before breakfast.  Diabetes mellitus type 2 with atherosclerosis of arteries of extremities (HCC)-his recent A1c was 7.4%.  His blood sugars are adequately well controlled.  Morbid obesity ([place])- He was praised for his recent success with weight loss.   I have discontinued Erling Conte. Nolley's HYDROcodone-acetaminophen, sulfamethoxazole-trimethoprim, and doxycycline. I am also having him start on dexlansoprazole. Additionally,  I am having him maintain his aspirin EC, blood glucose meter kit and supplies, TRUE METRIX METER, TRUEPLUS LANCETS 33G, TRUE METRIX BLOOD GLUCOSE TEST, metFORMIN, glimepiride, docusate sodium, polyethylene glycol, simvastatin, vitamin C, albuterol, famotidine, and linaclotide.  Meds ordered this encounter  Medications  . linaclotide (LINZESS) 145 MCG CAPS capsule    Sig: Take 1 capsule (145 mcg total) by mouth daily before breakfast.    Dispense:  90 capsule    Refill:  1  . dexlansoprazole (DEXILANT) 60 MG capsule    Sig: Take 1 capsule (60 mg total) by mouth daily.    Dispense:  90 capsule    Refill:  1     Follow-up: Return in about 3 months (around 10/04/2018).  Scarlette Calico, MD

## 2018-07-05 ENCOUNTER — Other Ambulatory Visit: Payer: Self-pay

## 2018-07-05 ENCOUNTER — Encounter (HOSPITAL_COMMUNITY)
Admission: RE | Admit: 2018-07-05 | Discharge: 2018-07-05 | Disposition: A | Discharge status: Home / Self Care | Payer: Medicare HMO | Source: Ambulatory Visit | Attending: Urology | Admitting: Urology

## 2018-07-05 ENCOUNTER — Encounter (HOSPITAL_COMMUNITY): Payer: Self-pay

## 2018-07-05 DIAGNOSIS — C672 Malignant neoplasm of lateral wall of bladder: Secondary | ICD-10-CM | POA: Diagnosis not present

## 2018-07-05 DIAGNOSIS — J45909 Unspecified asthma, uncomplicated: Secondary | ICD-10-CM | POA: Diagnosis not present

## 2018-07-05 DIAGNOSIS — K219 Gastro-esophageal reflux disease without esophagitis: Secondary | ICD-10-CM | POA: Diagnosis not present

## 2018-07-05 DIAGNOSIS — Z79899 Other long term (current) drug therapy: Secondary | ICD-10-CM | POA: Diagnosis not present

## 2018-07-05 DIAGNOSIS — E7800 Pure hypercholesterolemia, unspecified: Secondary | ICD-10-CM | POA: Diagnosis not present

## 2018-07-05 DIAGNOSIS — Z7982 Long term (current) use of aspirin: Secondary | ICD-10-CM | POA: Diagnosis not present

## 2018-07-05 DIAGNOSIS — Z87442 Personal history of urinary calculi: Secondary | ICD-10-CM | POA: Diagnosis not present

## 2018-07-05 DIAGNOSIS — E119 Type 2 diabetes mellitus without complications: Secondary | ICD-10-CM | POA: Diagnosis not present

## 2018-07-05 DIAGNOSIS — Z7984 Long term (current) use of oral hypoglycemic drugs: Secondary | ICD-10-CM | POA: Diagnosis not present

## 2018-07-05 DIAGNOSIS — G4730 Sleep apnea, unspecified: Secondary | ICD-10-CM | POA: Diagnosis not present

## 2018-07-05 LAB — GLUCOSE, CAPILLARY: Glucose-Capillary: 230 mg/dL — ABNORMAL HIGH (ref 70–99)

## 2018-07-05 LAB — BASIC METABOLIC PANEL
ANION GAP: 8 (ref 5–15)
BUN: 21 mg/dL (ref 8–23)
CO2: 27 mmol/L (ref 22–32)
Calcium: 9.4 mg/dL (ref 8.9–10.3)
Chloride: 103 mmol/L (ref 98–111)
Creatinine, Ser: 1.16 mg/dL (ref 0.61–1.24)
GFR calc Af Amer: >60 mL/min (ref >60)
GFR calc non Af Amer: 59 mL/min — ABNORMAL LOW (ref >60)
GLUCOSE: 233 mg/dL — AB (ref 70–99)
Potassium: 4.1 mmol/L (ref 3.5–5.1)
Sodium: 138 mmol/L (ref 135–145)

## 2018-07-07 ENCOUNTER — Ambulatory Visit (HOSPITAL_COMMUNITY): Payer: Medicare HMO | Admitting: Certified Registered Nurse Anesthetist

## 2018-07-07 ENCOUNTER — Ambulatory Visit (HOSPITAL_COMMUNITY): Payer: Medicare HMO

## 2018-07-07 ENCOUNTER — Encounter (HOSPITAL_COMMUNITY): Payer: Self-pay | Admitting: Certified Registered Nurse Anesthetist

## 2018-07-07 ENCOUNTER — Ambulatory Visit (HOSPITAL_COMMUNITY)
Admission: RE | Admit: 2018-07-07 | Discharge: 2018-07-07 | Disposition: A | Discharge status: Home / Self Care | Payer: Medicare HMO | Source: Ambulatory Visit | Attending: Urology | Admitting: Urology

## 2018-07-07 ENCOUNTER — Encounter (HOSPITAL_COMMUNITY)
Admission: RE | Disposition: A | Discharge status: Home / Self Care | Payer: Self-pay | Source: Ambulatory Visit | Attending: Urology

## 2018-07-07 DIAGNOSIS — E119 Type 2 diabetes mellitus without complications: Secondary | ICD-10-CM | POA: Insufficient documentation

## 2018-07-07 DIAGNOSIS — E7800 Pure hypercholesterolemia, unspecified: Secondary | ICD-10-CM | POA: Insufficient documentation

## 2018-07-07 DIAGNOSIS — E785 Hyperlipidemia, unspecified: Secondary | ICD-10-CM | POA: Diagnosis not present

## 2018-07-07 DIAGNOSIS — Z79899 Other long term (current) drug therapy: Secondary | ICD-10-CM | POA: Insufficient documentation

## 2018-07-07 DIAGNOSIS — G4730 Sleep apnea, unspecified: Secondary | ICD-10-CM | POA: Diagnosis not present

## 2018-07-07 DIAGNOSIS — E1169 Type 2 diabetes mellitus with other specified complication: Secondary | ICD-10-CM | POA: Diagnosis not present

## 2018-07-07 DIAGNOSIS — C675 Malignant neoplasm of bladder neck: Secondary | ICD-10-CM

## 2018-07-07 DIAGNOSIS — J45909 Unspecified asthma, uncomplicated: Secondary | ICD-10-CM | POA: Insufficient documentation

## 2018-07-07 DIAGNOSIS — C672 Malignant neoplasm of lateral wall of bladder: Secondary | ICD-10-CM | POA: Insufficient documentation

## 2018-07-07 DIAGNOSIS — D494 Neoplasm of unspecified behavior of bladder: Secondary | ICD-10-CM | POA: Diagnosis not present

## 2018-07-07 DIAGNOSIS — K219 Gastro-esophageal reflux disease without esophagitis: Secondary | ICD-10-CM | POA: Insufficient documentation

## 2018-07-07 DIAGNOSIS — C679 Malignant neoplasm of bladder, unspecified: Secondary | ICD-10-CM | POA: Diagnosis not present

## 2018-07-07 DIAGNOSIS — Z7984 Long term (current) use of oral hypoglycemic drugs: Secondary | ICD-10-CM | POA: Insufficient documentation

## 2018-07-07 DIAGNOSIS — Z7982 Long term (current) use of aspirin: Secondary | ICD-10-CM | POA: Diagnosis not present

## 2018-07-07 DIAGNOSIS — Z87442 Personal history of urinary calculi: Secondary | ICD-10-CM | POA: Insufficient documentation

## 2018-07-07 HISTORY — PX: CYSTOSCOPY W/ RETROGRADES: SHX1426

## 2018-07-07 HISTORY — PX: TRANSURETHRAL RESECTION OF BLADDER TUMOR: SHX2575

## 2018-07-07 LAB — GLUCOSE, CAPILLARY
Glucose-Capillary: 182 mg/dL — ABNORMAL HIGH (ref 70–99)
Glucose-Capillary: 178 mg/dL — ABNORMAL HIGH (ref 70–99)

## 2018-07-07 SURGERY — TURBT (TRANSURETHRAL RESECTION OF BLADDER TUMOR)
Anesthesia: General

## 2018-07-07 MED ORDER — SUGAMMADEX SODIUM 200 MG/2ML IV SOLN
INTRAVENOUS | Status: DC | PRN
  Administered 2018-07-07: 500 mg via INTRAVENOUS
  Administered 2018-07-07: 200 mg via INTRAVENOUS

## 2018-07-07 MED ORDER — METOCLOPRAMIDE HCL 5 MG/ML IJ SOLN: 10.0000 mg | Freq: Once | INTRAMUSCULAR | Status: DC | PRN

## 2018-07-07 MED ORDER — ONDANSETRON HCL 4 MG/2ML IJ SOLN
INTRAMUSCULAR | Status: AC
  Filled 2018-07-07: qty 2

## 2018-07-07 MED ORDER — SUCCINYLCHOLINE CHLORIDE 200 MG/10ML IV SOSY
PREFILLED_SYRINGE | INTRAVENOUS | Status: AC
  Filled 2018-07-07: qty 10

## 2018-07-07 MED ORDER — FENTANYL CITRATE (PF) 100 MCG/2ML IJ SOLN: 25.0000 ug | INTRAMUSCULAR | Status: DC | PRN

## 2018-07-07 MED ORDER — PHENYLEPHRINE 40 MCG/ML (10ML) SYRINGE FOR IV PUSH (FOR BLOOD PRESSURE SUPPORT)
PREFILLED_SYRINGE | INTRAVENOUS | Status: DC | PRN
  Administered 2018-07-07 (×2): 80 ug via INTRAVENOUS

## 2018-07-07 MED ORDER — DEXTROSE 5 % IV SOLN
3.0000 g | INTRAVENOUS | Status: DC
  Filled 2018-07-07: qty 3000

## 2018-07-07 MED ORDER — GEMCITABINE CHEMO FOR BLADDER INSTILLATION 2000 MG
2000.0000 mg | Freq: Once | INTRAVENOUS | Status: AC
  Administered 2018-07-07: 2000 mg via INTRAVESICAL
  Filled 2018-07-07: qty 2000

## 2018-07-07 MED ORDER — TRAMADOL HCL 50 MG PO TABS: 50.0000 mg | ORAL_TABLET | Freq: Four times a day (QID) | ORAL | 0 refills | Status: DC | PRN

## 2018-07-07 MED ORDER — DEXAMETHASONE SODIUM PHOSPHATE 10 MG/ML IJ SOLN
INTRAMUSCULAR | Status: AC
  Filled 2018-07-07: qty 1

## 2018-07-07 MED ORDER — FENTANYL CITRATE (PF) 100 MCG/2ML IJ SOLN
INTRAMUSCULAR | Status: DC | PRN
  Administered 2018-07-07 (×2): 50 ug via INTRAVENOUS

## 2018-07-07 MED ORDER — IOHEXOL 300 MG/ML  SOLN
INTRAMUSCULAR | Status: DC | PRN
  Administered 2018-07-07: 20 mL via URETHRAL

## 2018-07-07 MED ORDER — ONDANSETRON HCL 4 MG/2ML IJ SOLN
INTRAMUSCULAR | Status: DC | PRN
  Administered 2018-07-07: 4 mg via INTRAVENOUS

## 2018-07-07 MED ORDER — FENTANYL CITRATE (PF) 100 MCG/2ML IJ SOLN
INTRAMUSCULAR | Status: AC
  Filled 2018-07-07: qty 2

## 2018-07-07 MED ORDER — MIDAZOLAM HCL 5 MG/5ML IJ SOLN
INTRAMUSCULAR | Status: DC | PRN
  Administered 2018-07-07: 2 mg via INTRAVENOUS

## 2018-07-07 MED ORDER — CEFAZOLIN SODIUM-DEXTROSE 2-4 GM/100ML-% IV SOLN: 2.0000 g | INTRAVENOUS | Status: DC

## 2018-07-07 MED ORDER — DEXTROSE 5 % IV SOLN
3.0000 g | Freq: Once | INTRAVENOUS | Status: AC
  Administered 2018-07-07: 3 g via INTRAVENOUS
  Filled 2018-07-07: qty 3000

## 2018-07-07 MED ORDER — PROPOFOL 10 MG/ML IV BOLUS
INTRAVENOUS | Status: DC | PRN
  Administered 2018-07-07: 200 mg via INTRAVENOUS

## 2018-07-07 MED ORDER — SODIUM CHLORIDE 0.9 % IR SOLN
Status: DC | PRN
  Administered 2018-07-07: 6000 mL via INTRAVESICAL

## 2018-07-07 MED ORDER — SUGAMMADEX SODIUM 500 MG/5ML IV SOLN
INTRAVENOUS | Status: AC
  Filled 2018-07-07: qty 5

## 2018-07-07 MED ORDER — SUGAMMADEX SODIUM 200 MG/2ML IV SOLN
INTRAVENOUS | Status: AC
  Filled 2018-07-07: qty 2

## 2018-07-07 MED ORDER — LIDOCAINE HCL URETHRAL/MUCOSAL 2 % EX GEL
CUTANEOUS | Status: DC | PRN
  Administered 2018-07-07: 1 via URETHRAL

## 2018-07-07 MED ORDER — LIDOCAINE HCL URETHRAL/MUCOSAL 2 % EX GEL
CUTANEOUS | Status: AC
  Filled 2018-07-07: qty 5

## 2018-07-07 MED ORDER — PHENAZOPYRIDINE HCL 200 MG PO TABS: 200.0000 mg | ORAL_TABLET | Freq: Three times a day (TID) | ORAL | 0 refills | Status: DC | PRN

## 2018-07-07 MED ORDER — ROCURONIUM BROMIDE 10 MG/ML (PF) SYRINGE
PREFILLED_SYRINGE | INTRAVENOUS | Status: DC | PRN
  Administered 2018-07-07: 70 mg via INTRAVENOUS

## 2018-07-07 MED ORDER — MEPERIDINE HCL 50 MG/ML IJ SOLN: 6.2500 mg | INTRAMUSCULAR | Status: DC | PRN

## 2018-07-07 MED ORDER — PROPOFOL 10 MG/ML IV BOLUS
INTRAVENOUS | Status: AC
  Filled 2018-07-07: qty 40

## 2018-07-07 MED ORDER — MIDAZOLAM HCL 2 MG/2ML IJ SOLN
INTRAMUSCULAR | Status: AC
  Filled 2018-07-07: qty 2

## 2018-07-07 MED ORDER — LIDOCAINE 2% (20 MG/ML) 5 ML SYRINGE
INTRAMUSCULAR | Status: AC
  Filled 2018-07-07: qty 5

## 2018-07-07 MED ORDER — PHENYLEPHRINE 40 MCG/ML (10ML) SYRINGE FOR IV PUSH (FOR BLOOD PRESSURE SUPPORT)
PREFILLED_SYRINGE | INTRAVENOUS | Status: AC
  Filled 2018-07-07: qty 10

## 2018-07-07 MED ORDER — LACTATED RINGERS IV SOLN
INTRAVENOUS | Status: DC
  Administered 2018-07-07: 1000 mL via INTRAVENOUS

## 2018-07-07 MED ORDER — ROCURONIUM BROMIDE 10 MG/ML (PF) SYRINGE
PREFILLED_SYRINGE | INTRAVENOUS | Status: AC
  Filled 2018-07-07: qty 10

## 2018-07-07 MED ORDER — DEXAMETHASONE SODIUM PHOSPHATE 4 MG/ML IJ SOLN
INTRAMUSCULAR | Status: DC | PRN
  Administered 2018-07-07: 10 mg via INTRAVENOUS

## 2018-07-07 SURGICAL SUPPLY — 29 items
BAG URINE DRAINAGE (UROLOGICAL SUPPLIES) ×4 IMPLANT
BAG URO CATCHER STRL LF (MISCELLANEOUS) ×4 IMPLANT
BASKET ZERO TIP NITINOL 2.4FR (BASKET) IMPLANT
CATH FOLEY 2WAY SLVR 30CC 24FR (CATHETERS) IMPLANT
CATH URET 5FR 28IN OPEN ENDED (CATHETERS) ×4 IMPLANT
CLOTH BEACON ORANGE TIMEOUT ST (SAFETY) ×4 IMPLANT
ELECT REM PT RETURN 15FT ADLT (MISCELLANEOUS) IMPLANT
EVACUATOR MICROVAS BLADDER (UROLOGICAL SUPPLIES) IMPLANT
EXTRACTOR STONE 1.7FRX115CM (UROLOGICAL SUPPLIES) IMPLANT
GLOVE BIOGEL M 8.0 STRL (GLOVE) ×4 IMPLANT
GLOVE BIOGEL M STRL SZ7.5 (GLOVE) ×4 IMPLANT
GOWN STRL REUS W/ TWL XL LVL3 (GOWN DISPOSABLE) IMPLANT
GOWN STRL REUS W/TWL XL LVL3 (GOWN DISPOSABLE) ×4 IMPLANT
GUIDEWIRE ANG ZIPWIRE 038X150 (WIRE) IMPLANT
GUIDEWIRE STR DUAL SENSOR (WIRE) ×4 IMPLANT
LOOP CUT BIPOLAR 24F LRG (ELECTROSURGICAL) ×4 IMPLANT
MANIFOLD NEPTUNE II (INSTRUMENTS) ×4 IMPLANT
NDL SAFETY ECLIPSE 18X1.5 (NEEDLE) ×2 IMPLANT
NEEDLE HYPO 18GX1.5 SHARP (NEEDLE) ×2
PACK CYSTO (CUSTOM PROCEDURE TRAY) ×4 IMPLANT
SET ASPIRATION TUBING (TUBING) IMPLANT
SHEATH URETERAL 12FRX28CM (UROLOGICAL SUPPLIES) IMPLANT
SHEATH URETERAL 12FRX35CM (MISCELLANEOUS) IMPLANT
SYRINGE IRR TOOMEY STRL 70CC (SYRINGE) IMPLANT
TUBING CONNECTING 10 (TUBING) ×3 IMPLANT
TUBING CONNECTING 10' (TUBING) ×1
TUBING UROLOGY SET (TUBING) ×4 IMPLANT
WATER STERILE IRR 3000ML UROMA (IV SOLUTION) IMPLANT
WIRE COONS/BENSON .038X145CM (WIRE) IMPLANT

## 2018-07-07 NOTE — Anesthesia Preprocedure Evaluation (Signed)
Anesthesia Evaluation  Patient identified by MRN, date of birth, ID band Patient awake    Reviewed: Allergy & Precautions, NPO status , Patient's Chart, lab work & pertinent test results  Airway Mallampati: II  TM Distance: >3 FB Neck ROM: Full    Dental no notable dental hx. (+) Poor Dentition, Missing   Pulmonary asthma , former smoker,    Pulmonary exam normal breath sounds clear to auscultation       Cardiovascular negative cardio ROS Normal cardiovascular exam Rhythm:Regular Rate:Normal     Neuro/Psych negative neurological ROS  negative psych ROS   GI/Hepatic negative GI ROS, Neg liver ROS, GERD  Poorly Controlled,  Endo/Other  negative endocrine ROSdiabetes  Renal/GU negative Renal ROS  negative genitourinary   Musculoskeletal negative musculoskeletal ROS (+)   Abdominal   Peds negative pediatric ROS (+)  Hematology negative hematology ROS (+)   Anesthesia Other Findings   Reproductive/Obstetrics negative OB ROS                             Anesthesia Physical Anesthesia Plan  ASA: II  Anesthesia Plan: General   Post-op Pain Management:    Induction: Intravenous  PONV Risk Score and Plan: 2 and Ondansetron and Treatment may vary due to age or medical condition  Airway Management Planned: Oral ETT  Additional Equipment:   Intra-op Plan:   Post-operative Plan: Extubation in OR  Informed Consent: I have reviewed the patients History and Physical, chart, labs and discussed the procedure including the risks, benefits and alternatives for the proposed anesthesia with the patient or authorized representative who has indicated his/her understanding and acceptance.   Dental advisory given  Plan Discussed with: CRNA  Anesthesia Plan Comments:         Anesthesia Quick Evaluation

## 2018-07-07 NOTE — Anesthesia Procedure Notes (Signed)
Procedure Name: Intubation Date/Time: 07/07/2018 7:44 AM Performed by: Claudia Desanctis, CRNA Pre-anesthesia Checklist: Patient identified, Emergency Drugs available, Suction available and Patient being monitored Patient Re-evaluated:Patient Re-evaluated prior to induction Oxygen Delivery Method: Circle system utilized Preoxygenation: Pre-oxygenation with 100% oxygen Induction Type: IV induction Ventilation: Mask ventilation without difficulty Laryngoscope Size: Miller and 3 Grade View: Grade I Tube type: Oral Tube size: 8.0 mm Number of attempts: 1 Airway Equipment and Method: Stylet Placement Confirmation: ETT inserted through vocal cords under direct vision,  positive ETCO2 and breath sounds checked- equal and bilateral Secured at: 24 cm Tube secured with: Tape Dental Injury: Teeth and Oropharynx as per pre-operative assessment

## 2018-07-07 NOTE — Op Note (Signed)
Preoperative diagnosis:  1. Right lateral wall bladder wall tumor, 2cm in size  Postoperative diagnosis:  1. same   Procedure: 1. Transurethral resection of bladder tumor, 2 cm in size 2. Bilateral retrograde pyelograms with interpretation 3. Postoperative intravesical instillation of gemcitabine  Surgeon: Ardis Hughs, MD  Anesthesia: General  Complications: None  Intraoperative findings:  #1: The patient's right retrograde pyelogram was performed using 10 cc of Omnipaque contrast demonstrating no filling defects, normal caliber ureter and a normal-appearing collecting system with sharp calyces. #2: The left retrograde pyelogram demonstrated normal caliber ureter with no filling defects.  The collecting system was bifid, but there is no hydroureteronephrosis or blunting of the calyces.   #3: The patient had a 2 cm low-grade appearing tumor on the right lateral wall that was on a very narrow stalk.  EBL: Minimal  Specimens:  #1: Bladder tumor #2: Bladder tumor base  Indication: Jason [name] is a 77 y.o. patient with hematuria, and was noted to have a bladder tumor on subsequent work-up.  After reviewing the management options for treatment, he elected to proceed with the above surgical procedure(s). We have discussed the potential benefits and risks of the procedure, side effects of the proposed treatment, the likelihood of the patient achieving the goals of the procedure, and any potential problems that might occur during the procedure or recuperation. Informed consent has been obtained.  Description of procedure:  The patient was taken to the operating room and general anesthesia was induced.  The patient was placed in the dorsal lithotomy position, prepped and draped in the usual sterile fashion, and preoperative antibiotics were administered. A preoperative time-out was performed.   21 French 30 degrees cystoscope was gently passed to the patient's urethra and the  bladder revealed guidance.  3 and 6 degrees cystoscopic evaluation was performed and striking normal bladder mucosa with no significant abnormality noted except for the bladder tumor on the right lateral wall.  Patient did have a large median lobe and the ureteral orifice ease were somewhat posterior facing.  I was able to cannulate the right ureteral orifice with a 5 [place] open-ended ureteral catheter and performed retrograde pyelogram using Omnipaque contrast with the above findings.  The process was then repeated on the left side.  The cystoscope sheath was then gently removed and replaced with a 26 French resectoscope sheath using the visual obturator and 30 degree lens.  Once in the bladder I exchange the obturator for a resectoscope using the electrocautery element.  The tumor was then completely resected and removed.  I then took a second specimen at the bladder base.  I then inserted an 30 French Foley catheter after instilling with lidocaine jelly.  The patient was subsequently extubated return the PACU in stable condition.  In the PACU, 41ml of gemcitabine was instilled into the patient's bladder through an 21 French Foley catheter.  This was allowed to dwell for 60-90 minutes prior to emptying the Foley catheter and removing it.   Ardis Hughs, M.D.

## 2018-07-07 NOTE — H&P (Signed)
gross hematuria evaluation  HPI: Jason [name] is a 77 year-old male patient who is here for further evaluation of gross hematuria.Marland Kitchen  He first noticed the blood approximately 03/15/2018. He has noticed blood only once.   The patient has had an abdominal ct scan within the last year. He has not been told that he has blood in his urine prior to this episode.   The patient denies any progression of his voiding symptoms. The patient denies any new or recent onset of back/flank pain or suprapubic pain.   The patient does not have a history of recurrent UTIs. They have a history of kidney stones. He has not been exposed to occupational hazards that may increase their risks for developing cancer. The patient has no family history of GU malignancy. The patient is a former smoker.   CT scan performed May 2019: Bilateral punctate nonobstructing renal calculi. Bilateral simple renal cyst. Prostatic enlargement.   May 2019: PSA 0.63, BUN/Cr - 10/10.1     ALLERGIES: No Allergies    MEDICATIONS: Amaryl  Ascorbic Acid  Aspirin 81 MG TABS Oral  Calcium + D  Calcium 600 + D TABS Oral  Metformin Hcl 500 mg tablet Oral  Miralax 17 gram powder in packet  Simvastatin 80 mg tablet Oral  Vitamin C  Vitamin D 1000 UNIT CAPS Oral     GU PSH: Non-Newborn Circumcision - 2010      [place] Notes: Circumcision No Clamp/Device/Dorsal Slit Older Than 28 Days, Surgery Foot Amputation Toe At IP Joint, Knee Surgery, intestinal surgery   NON-GU PSH: Partial Amputate Toe - 2010    GU PMH: BPH w/LUTS, Benign prostatic hyperplasia with urinary obstruction - 2014 BPH w/o LUTS, Benign prostatic hypertrophy without lower urinary tract symptoms - 2014 ED due to arterial insufficiency, Erectile dysfunction due to arterial insufficiency - 2014 Encounter for Prostate Cancer screening, Prostate cancer screening - 2014 Personal Hx Oth male genital organs diseases, History of phimosis - 2014 Urinary Urgency, Urinary urgency -  2014      PMH Notes: Heart murmur  1898-11-15 00:00:00 - Note: Normal Routine History And Physical Senior Citizen [phone])  2007-06-07 10:54:42 - Note: Arthritis   NON-GU PMH: Anxiety, Anxiety - 2014 Cardiac murmur, unspecified, Murmurs - 2014 Diabetes Type 2, Diabetes mellitus - 2014 GERD, Esophageal reflux - 2014 Personal history of other endocrine, nutritional and metabolic disease, History of hypercholesterolemia - 2014 Personal history of other mental and behavioral disorders, History of depression - 2014 Sleep Apnea, Sleep apnea - 2014 Arthritis Asthma Depression Glaucoma Hypercholesterolemia    FAMILY HISTORY: 1 Daughter - Daughter 1 son - Son Death In The Family Father - Father Family Health Status Number - Runs In Family Hypertension - Father nephrolithiasis - Father Prostate Cancer - Father Stroke Syndrome - Father   SOCIAL HISTORY: Marital Status: Married Preferred Language: English; Ethnicity: Not Hispanic Or Latino; Race: Black or African American Current Smoking Status: Patient smokes. Smokes 1/2 pack per day.   Tobacco Use Assessment Completed: Used Tobacco in last 30 days? Does drink.  Drinks 1 caffeinated drink per day. Patient's occupation is/was Retired.     Notes: Tobacco Use, Alcohol Use, Marital History - Currently Married, Occupation:, Caffeine Use   REVIEW OF SYSTEMS:    GU Review Male:   Patient reports frequent urination, hard to postpone urination, get up at night to urinate, and trouble starting your stream. Patient denies burning/ pain with urination, leakage of urine, stream starts and stops, have to strain to  urinate , erection problems, and penile pain.  Gastrointestinal (Upper):   Patient reports nausea and indigestion/ heartburn. Patient denies vomiting.  Gastrointestinal (Lower):   Patient denies diarrhea and constipation.  Constitutional:   Patient denies fever, night sweats, weight loss, and fatigue.  Skin:   Patient denies skin rash/  lesion and itching.  Eyes:   Patient denies blurred vision and double vision.  Ears/ Nose/ Throat:   Patient denies sore throat and sinus problems.  Hematologic/Lymphatic:   Patient denies swollen glands and easy bruising.  Cardiovascular:   Patient reports leg swelling. Patient denies chest pains.  Respiratory:   Patient reports cough and shortness of breath.   Endocrine:   Patient denies excessive thirst.  Musculoskeletal:   Patient denies back pain and joint pain.  Neurological:   Patient reports headaches. Patient denies dizziness.  Psychologic:   Patient denies depression and anxiety.   VITAL SIGNS:      06/13/2018 09:05 AM  Weight 303 lb / 137.44 kg  Height 80 in / 203.2 cm  BP 147/73 mmHg  Pulse 75 /min  BMI 33.3 kg/m   MULTI-SYSTEM PHYSICAL EXAMINATION:    Constitutional: Well-nourished. No physical deformities. Normally developed. Good grooming.  Neck: Neck symmetrical, not swollen. Normal tracheal position.  Respiratory: No labored breathing, no use of accessory muscles.   Cardiovascular: Normal temperature, normal extremity pulses, no swelling, no varicosities.  Lymphatic: No enlargement of neck, axillae, groin.  Skin: No paleness, no jaundice, no cyanosis. No lesion, no ulcer, no rash.  Neurologic / Psychiatric: Oriented to time, oriented to place, oriented to person. No depression, no anxiety, no agitation.  Gastrointestinal: No mass, no tenderness, no rigidity, non obese abdomen.  Eyes: Normal conjunctivae. Normal eyelids.  Ears, Nose, Mouth, and Throat: Left ear no scars, no lesions, no masses. Right ear no scars, no lesions, no masses. Nose no scars, no lesions, no masses. Normal hearing. Normal lips.  Musculoskeletal: Normal gait and station of head and neck.     PAST DATA REVIEWED:  Source Of History:  Patient  Records Review:   Previous Doctor Records, Previous Patient Records, POC Tool  X-Ray Review: C.T. Abdomen/Pelvis: Reviewed Films. Discussed With Patient.      01/20/11 01/15/10 06/18/09 06/08/07  PSA  Total PSA 0.47  0.46  0.46  0.44     PROCEDURES:         Flexible Cystoscopy - 52000  Risks, benefits, and some of the potential complications of the procedure were discussed at length with the patient including infection, bleeding, voiding discomfort, urinary retention, fever, chills, sepsis, and others. All questions were answered. Informed consent was obtained. Sterile technique and intraurethral analgesia were used.  Meatus:  Normal size. Normal location. Normal condition.  Urethra:  No strictures.  External Sphincter:  Normal.  Verumontanum:  Normal.  Prostate:  Non-obstructing. No hyperplasia.  Bladder Neck:  Non-obstructing.  Ureteral Orifices:  Normal location. Normal size. Normal shape. Effluxed clear urine.  Bladder:  2 1/2 cm tumor right lateral wall/bladder neck. No trabeculation. Normal mucosa. No stones.      The lower urinary tract was carefully examined. The procedure was well-tolerated and without complications. Antibiotic instructions were given. Instructions were given to call the office immediately for bloody urine, difficulty urinating, urinary retention, painful or frequent urination, fever, chills, nausea, vomiting or other illness. The patient stated that he understood these instructions and would comply with them.         Urinalysis w/Scope - 81001 Dipstick Dipstick  Cont'd Micro  Color: Orange Bilirubin: 1+ WBC/hpf: 0 - 5/hpf  Appearance: Cloudy Ketones: Neg RBC/hpf: 10 - 20/hpf  Specific Gravity: 1.020 Blood: 3+ Bacteria: NS (Not Seen)  pH: 5.5 Protein: 1+ Cystals: NS (Not Seen)  Glucose: 1+ Urobilinogen: 1.0 Casts: NS (Not Seen)    Nitrites: Neg Trichomonas: Not Present    Leukocyte Esterase: Neg Mucous: Not Present      Epithelial Cells: 0 - 5/hpf      Yeast: NS (Not Seen)      Sperm: Not Present    Notes:      ASSESSMENT:      ICD-10 Details  1 GU:   Gross hematuria - R31.0   2   Bladder Cancer  Lateral - C67.2    PLAN:           Orders Labs Urine Culture          Document Letter(s):  Created for Patient: Clinical Summary         Notes:   The patient has a 2-3 cm bladder tumor on the right lateral wall just past the bladder neck. This is likely a low-grade lesion. There is no other findings on cystoscopy today within the bladder that I noted. He did have a soft bulbar urethral stricture. I sent the patient turned to be cultured today to ensure that he has no infection as well.   I recommended that we proceed to the operating room for transurethral resection of his bladder tumor and bilateral retrograde pyelograms. I went through the surgery with the patient detail including the risks and the benefits. The patient states that he is scheduled to see a general surgeon for cholecystectomy. If they would like to coordinate and get this done under one anesthesia, I'm happy to oblige.

## 2018-07-07 NOTE — Anesthesia Postprocedure Evaluation (Signed)
Anesthesia Post Note  Patient: ABASS MISENER  Procedure(s) Performed: TRANSURETHRAL RESECTION OF BLADDER TUMOR (TURBT) WITH POST OP INSTILLATION OF GEMCITABIN (N/A ) CYSTOSCOPY WITH BILATERAL RETROGRADE PYELOGRAM (Bilateral )     Patient location during evaluation: PACU Anesthesia Type: General Level of consciousness: awake and alert Pain management: pain level controlled Vital Signs Assessment: post-procedure vital signs reviewed and stable Respiratory status: spontaneous breathing, nonlabored ventilation, respiratory function stable and patient connected to nasal cannula oxygen Cardiovascular status: blood pressure returned to baseline and stable Postop Assessment: no apparent nausea or vomiting Anesthetic complications: no    Last Vitals:  Vitals:   07/07/18 0945 07/07/18 1030  BP: 134/74 134/61  Pulse: 70 74  Resp: (!) 23 20  Temp: 36.6 C 36.4 C  SpO2: 96% 99%    Last Pain:  Vitals:   07/07/18 1030  TempSrc:   PainSc: 0-No pain                 Montez Hageman

## 2018-07-07 NOTE — Transfer of Care (Signed)
Immediate Anesthesia Transfer of Care Note  Patient: Jason [name]  Procedure(s) Performed: TRANSURETHRAL RESECTION OF BLADDER TUMOR (TURBT) WITH POST OP INSTILLATION OF GEMCITABIN (N/A ) CYSTOSCOPY WITH BILATERAL RETROGRADE PYELOGRAM (Bilateral )  Patient Location: PACU  Anesthesia Type:General  Level of Consciousness: awake, alert , oriented and patient cooperative  Airway & Oxygen Therapy: Patient Spontanous Breathing and Patient connected to face mask  Post-op Assessment: Report given to RN and Post -op Vital signs reviewed and stable  Post vital signs: Reviewed and stable  Last Vitals:  Vitals Value Taken Time  BP 125/72 07/07/2018  8:30 AM  Temp    Pulse 75 07/07/2018  8:29 AM  Resp 24 07/07/2018  8:29 AM  SpO2 100 % 07/07/2018  8:29 AM  Vitals shown include unvalidated device data.  Last Pain:  Vitals:   07/07/18 0555  TempSrc:   PainSc: 0-No pain      Patients Stated Pain Goal: 4 (88/75/79 [id number])  Complications: No apparent anesthesia complications

## 2018-07-07 NOTE — Discharge Instructions (Signed)

## 2018-07-08 ENCOUNTER — Encounter (HOSPITAL_COMMUNITY): Payer: Self-pay | Admitting: Urology

## 2018-07-10 ENCOUNTER — Ambulatory Visit: Payer: Self-pay | Admitting: *Deleted

## 2018-07-10 ENCOUNTER — Telehealth: Payer: Self-pay | Admitting: Internal Medicine

## 2018-07-10 NOTE — Telephone Encounter (Signed)
True metrix blood test strips  refill Last Refill:05/10/17 # 300, prescription expired on 05/10/18 Last OV: 07/04/18 PCP: Dr. Scarlette Calico Pharmacy:  [facility]  Also lancets expired on 06/11/18  Pt has office visit coming up 07/11/18.

## 2018-07-10 NOTE — Telephone Encounter (Signed)
Copied from [place] [id number]. Topic: Quick Communication - See Telephone Encounter >> Jul 10, 2018  3:28 PM Hewitt Shorts wrote: Pt is needing a refill on his test strips True Metric -he test about 3 tiems a day  Also is there something he can do about the callisus that he has on his fingers from testing all the time   [facility] [place] number [phone]

## 2018-07-10 NOTE — Telephone Encounter (Signed)
Pt called with complaints of high glucose level yesterday 07/09/18); he says it was 421, 321, 315, and 215; today it was 174 prior to 0800, and 256 at 1500; the pt says he was metformin (500 mg) twice daily, glimepiride 2 mg daily with breakfast; the pt says that he ate vegetarian chili yesterday, and he may have missed some doses of his medication due to surgical procedures; the pt also reports that he is feeling sleepy; he says he took metformin 500 mg and glimepiride 2 mg this morning around 0800; the pt says his temperature this morning was 97.1; nurse triage recommendations made per protocol; the pt normally sees Dr Scarlette Calico but he has no availability per protocol guidelines; spoke with Sam regarding scheduling the pt; she informs me to schedule the pt with Jodi Mourning on 07/11/18 and she will inform the pt when to have follow up with Dr Ronnald Ramp; pt offered and accepted appointment with  Jodi Mourning, [facility], 07/11/18 at [place]; he verbalizes understanding; will route to office for notification of this upcoming appointment.   Reason for Disposition . [1] Caller has URGENT medication or insulin pump question AND [2] triager unable to answer question  Answer Assessment - Initial Assessment Questions 1. BLOOD GLUCOSE: "What is your blood glucose level?"      256 2. ONSET: "When did you check the blood glucose?"     07/10/18 at 1500 3. USUAL RANGE: "What is your glucose level usually?" (e.g., usual fasting morning value, usual evening value)     145-150 fasting am; inconsistent due to being off medications for surgical procedure; 215-315 since surgical procedure  4. KETONES: "Do you check for ketones (urine or blood test strips)?" If yes, ask: "What does the test show now?"      no 5. TYPE 1 or 2:  "Do you know what type of diabetes you have?"  (e.g., Type 1, Type 2, Gestational; doesn't know)      Type  6. INSULIN: "Do you take insulin?" "What type of insulin(s) do you use? What is the mode of  delivery? (syringe, pen (e.g., injection or  pump)?"      no 7. DIABETES PILLS: "Do you take any pills for your diabetes?" If yes, ask: "Have you missed taking any pills recently?"     Yes metformin and glimiperide 8. OTHER SYMPTOMS: "Do you have any symptoms?" (e.g., fever, frequent urination, difficulty breathing, dizziness, weakness, vomiting)     sleepiness 9. PREGNANCY: "Is there any chance you are pregnant?" "When was your last menstrual period?"     N/a  Protocols used: DIABETES - HIGH BLOOD SUGAR-A-AH

## 2018-07-11 ENCOUNTER — Ambulatory Visit (INDEPENDENT_AMBULATORY_CARE_PROVIDER_SITE_OTHER): Payer: Medicare HMO | Admitting: Family

## 2018-07-11 ENCOUNTER — Encounter: Payer: Self-pay | Admitting: Family

## 2018-07-11 ENCOUNTER — Telehealth: Payer: Self-pay | Admitting: *Deleted

## 2018-07-11 VITALS — BP 116/62 | HR 74 | Temp 98.0°F | Ht >= 80 in | Wt 289.1 lb

## 2018-07-11 DIAGNOSIS — E1165 Type 2 diabetes mellitus with hyperglycemia: Secondary | ICD-10-CM

## 2018-07-11 DIAGNOSIS — E118 Type 2 diabetes mellitus with unspecified complications: Secondary | ICD-10-CM | POA: Diagnosis not present

## 2018-07-11 DIAGNOSIS — IMO0002 Reserved for concepts with insufficient information to code with codable children: Secondary | ICD-10-CM

## 2018-07-11 DIAGNOSIS — C679 Malignant neoplasm of bladder, unspecified: Secondary | ICD-10-CM | POA: Insufficient documentation

## 2018-07-11 MED ORDER — METFORMIN HCL 500 MG PO TABS: 1000.0000 mg | ORAL_TABLET | Freq: Two times a day (BID) | ORAL | 1 refills | Status: DC

## 2018-07-11 MED ORDER — GLUCOSE BLOOD VI STRP: ORAL_STRIP | 3 refills | Status: DC

## 2018-07-11 MED ORDER — TRUEPLUS LANCETS 33G MISC: 3 refills | Status: DC

## 2018-07-11 NOTE — Progress Notes (Signed)
Jason [name] is a 77 y.o. male with the following history as recorded in EpicCare:  Patient Active Problem List   Diagnosis Date Noted  . Malignant neoplasm of urinary bladder ([place]) 07/11/2018  . Chronic idiopathic constipation 06/12/2018  . Diabetic infection of right foot ([place]) 07/12/2017  . Foot ulcer, right, with unspecified severity ([place]) 01/17/2017  . Complex sleep apnea syndrome 01/12/2016  . Diabetes mellitus type 2, uncontrolled, with complications ([place]) 48/11/6551  . OSA (obstructive sleep apnea) 12/18/2015  . Benign prostatic hyperplasia 02/18/2014  . Routine general medical examination at a health care facility 02/18/2014  . PAD (peripheral artery disease) ([name]) 02/18/2014  . DEGENERATIVE JOINT DISEASE 02/20/2008  . Diabetes mellitus type 2 with atherosclerosis of arteries of extremities ([place]) 01/26/2008  . Hyperlipidemia with target LDL less than 70 01/26/2008  . Morbid obesity ([place]) 01/26/2008  . Mitral valve disorder 01/26/2008  . GERD 01/26/2008  . Irritable bowel syndrome 01/26/2008    Current Outpatient Medications  Medication Sig Dispense Refill  . albuterol (PROVENTIL) (2.5 MG/3ML) 0.083% nebulizer solution Take 3 mLs (2.5 mg total) by nebulization every 4 (four) hours as needed for wheezing.    Marland Kitchen aspirin EC 81 MG tablet Take 1 tablet (81 mg total) by mouth daily. 90 tablet 3  . blood glucose meter kit and supplies KIT Use to test blood sugar up to 3 times daily. DX E11.9 1 each 0  . Blood Glucose Monitoring Suppl (TRUE METRIX METER) w/Device KIT Use to check blood sugar DX E11.8 1 kit 0  . dexlansoprazole (DEXILANT) 60 MG capsule Take 1 capsule (60 mg total) by mouth daily. 90 capsule 1  . docusate sodium (COLACE) 100 MG capsule Take 1 capsule (100 mg total) by mouth 2 (two) times daily. 10 capsule 0  . famotidine (PEPCID) 20 MG tablet Take 1 tablet (20 mg total) by mouth 2 (two) times daily. 10 tablet 0  . glimepiride (AMARYL) 2 MG tablet TAKE 1 TABLET EVERY  DAY WITH BREAKFAST (Patient taking differently: Take 2 mg by mouth daily with breakfast. ) 90 tablet 1  . linaclotide (LINZESS) 145 MCG CAPS capsule Take 1 capsule (145 mcg total) by mouth daily before breakfast. 90 capsule 1  . metFORMIN (GLUCOPHAGE) 500 MG tablet Take 2 tablets (1,000 mg total) by mouth 2 (two) times daily. 360 tablet 1  . phenazopyridine (PYRIDIUM) 200 MG tablet Take 1 tablet (200 mg total) by mouth 3 (three) times daily as needed for pain. 10 tablet 0  . polyethylene glycol (MIRALAX / GLYCOLAX) packet Take 17 g by mouth daily. 14 each 0  . simvastatin (ZOCOR) 40 MG tablet Take 40 mg by mouth every evening.    . traMADol (ULTRAM) 50 MG tablet Take 1-2 tablets (50-100 mg total) by mouth every 6 (six) hours as needed for moderate pain. 15 tablet 0  . TRUE METRIX BLOOD GLUCOSE TEST test strip USE  TO TEST BLOOD SUGAR  UP  TO THREE TIMES DAILY 300 each 3  . TRUEPLUS LANCETS 33G MISC USE  TO TEST BLOOD SUGAR  UP  TO THREE TIMES DAILY 300 each 3  . vitamin C (ASCORBIC ACID) 500 MG tablet Take 500 mg by mouth daily.      No current facility-administered medications for this visit.     Allergies: Bactrim [sulfamethoxazole-trimethoprim]  Past Medical History:  Diagnosis Date  . Anxiety   . Asthma    in past, no inhaler now  . Benign neoplasm of colon   .  Depression   . Diverticulosis of colon (without mention of hemorrhage)   . DJD (degenerative joint disease)   . Esophageal reflux   . Glaucoma   . Hidradenitis   . History of BPH   . History of gynecomastia    Unilateral  . Hyperlipidemia   . Irritable bowel syndrome   . Memory loss   . MVP (mitral valve prolapse)   . Obesity, unspecified   . Sleep apnea    does not use  cpap or bipap .. no study  ever  . Type II or unspecified type diabetes mellitus without mention of complication, not stated as uncontrolled   . Unspecified venous (peripheral) insufficiency     Past Surgical History:  Procedure Laterality Date  .  AMPUTATION  08/09/2012   Procedure: AMPUTATION DIGIT;  Surgeon: Newt Minion, MD;  Location: [facility];  Service: Orthopedics;  Laterality: Right;  Amputation right Great Toe MTP Joint  . AMPUTATION  10/17/2012   Procedure: AMPUTATION DIGIT;  Surgeon: Newt Minion, MD;  Location: [facility];  Service: Orthopedics;  Laterality: Right;  Right Foot 3rd Toe Amputation at Metatarsophalangeal Joint  . CHOLECYSTECTOMY N/A 06/14/2018   Procedure: LAPAROSCOPIC CHOLECYSTECTOMY WITH INTRAOPERATIVE CHOLANGIOGRAM;  Surgeon: Kieth Brightly Arta Bruce, MD;  Location: [facility];  Service: General;  Laterality: N/A;  . CIRCUMCISION  06/2100   For Phimosis - Dr Hartley Barefoot  . COLON SURGERY    . CYSTOSCOPY W/ RETROGRADES Bilateral 07/07/2018   Procedure: CYSTOSCOPY WITH BILATERAL RETROGRADE PYELOGRAM;  Surgeon: Ardis Hughs, MD;  Location: [facility];  Service: Urology;  Laterality: Bilateral;  . KNEE ARTHROSCOPY    . LAPAROSCOPIC LYSIS OF ADHESIONS  06/14/2018   Procedure: LAPAROSCOPIC LYSIS OF ADHESIONS;  Surgeon: Kieth Brightly Arta Bruce, MD;  Location: [facility];  Service: General;;  . LAPAROTOMY  07/20/11  . LASER ABLATION  06/2009   Left Greater Saphenous vein -Dr Donnetta Hutching  . TOE AMPUTATION  06/2009   x3Right foot second toe -Dr Beola Cord  . TONSILLECTOMY    . TRANSURETHRAL RESECTION OF BLADDER TUMOR N/A 07/07/2018   Procedure: TRANSURETHRAL RESECTION OF BLADDER TUMOR (TURBT) WITH POST OP INSTILLATION OF GEMCITABIN;  Surgeon: Ardis Hughs, MD;  Location: [facility];  Service: Urology;  Laterality: N/A;    Family History  Problem Relation Age of Onset  . Diabetes Father   . Cancer Father        Prostate  . Stroke Father   . Prostate cancer Father   . Colon cancer Neg Hx   . Esophageal cancer Neg Hx   . Rectal cancer Neg Hx   . Stomach cancer Neg Hx     Social History   Tobacco Use  . Smoking status: Former Smoker    Packs/day: 0.50    Years: 5.00    Pack years: 2.50    Types: Cigarettes    Last attempt to quit:  09/05/2015    Years since quitting: 2.8  . Smokeless tobacco: Never Used  . Tobacco comment: He quit in 1973.  Smoked for one year between 2015 and 2015  Substance Use Topics  . Alcohol use: Yes    Alcohol/week: 7.0 standard drinks    Types: 7 Cans of beer per week    Comment: not every week     Subjective:  Patient presents with concerns for elevated blood sugar for the past month; has had difficult summer- gallbladder was removed on June 14, 2018; then was found to have bladder cancer/ surgery in  the past week; scheduled to have a foot surgery later this week; admits that has had a hard time keeping with his medications with all the surgical procedures; is only taking 500 mg of Metformin twice a day/ Amaryl 2 mg daily; blood sugar this morning was at 170; has seen blood sugar as high as 480; Denies any chest pain, shortness of breath, blurred vision or headache.   Objective:  Vitals:   07/11/18 0849  BP: 116/62  Pulse: 74  Temp: 98 F (36.7 C)  TempSrc: Oral  SpO2: 97%  Weight: 289 lb 1.9 oz (131.1 kg)  Height: '6\' 8"'  (2.032 m)    General: Well developed, well nourished, in no acute distress  Skin : Warm and dry.  Head: Normocephalic and atraumatic  Lungs: Respirations unlabored; clear to auscultation bilaterally without wheeze, rales, rhonchi  CVS exam: normal rate and regular rhythm.  Neurologic: Alert and oriented; speech intact; face symmetrical; moves all extremities well; CNII-XII intact without focal deficit   Assessment:  1. Diabetes mellitus type 2, uncontrolled, with complications ([place])     Plan:  Patient did not understand he was supposed to be taking 1000 mg of Metformin bid- has only been taking 1000 mg daily; Increase to 1000 mg bid and continue Amaryl 2 mg for now; may need to add extra dose of Amaryl or consider adding another agent; he will call back with his response by the end of the week so medications can be adjusted as needed; Follow-up to be determined.    No follow-ups on file.  No orders of the defined types were placed in this encounter.   Requested Prescriptions   Signed Prescriptions Disp Refills  . metFORMIN (GLUCOPHAGE) 500 MG tablet 360 tablet 1    Sig: Take 2 tablets (1,000 mg total) by mouth 2 (two) times daily.

## 2018-07-11 NOTE — Patient Instructions (Addendum)
Please take 1000 mg of Metformin in the morning and 1000 mg in the evening; Please take 2 mg of Amaryl daily for now; Let me hear back from you on Friday as to how your blood sugar is responding to these changes.

## 2018-07-11 NOTE — Telephone Encounter (Signed)
Erx has been sent as requested  

## 2018-07-11 NOTE — Telephone Encounter (Signed)
I am calling regarding Jason [name].  He's scheduled for surgery on tomorrow.  Did Dr. Amalia Hailey do a wound culture on him?"  Dr. Amalia Hailey did a culture but we don't have the results yet.  I will call [facility] and see if they will send the results to [place].  "Please send it to my email."  I called [facility] and spoke to [facility].  I requested the results.  She said she would fax them to me in about 10 to 15 minutes.  Fax was received.  I faxed the information to Anne Ng at [facility].

## 2018-07-12 ENCOUNTER — Telehealth: Payer: Self-pay | Admitting: Internal Medicine

## 2018-07-12 ENCOUNTER — Telehealth: Payer: Self-pay | Admitting: *Deleted

## 2018-07-12 ENCOUNTER — Encounter: Payer: Medicare HMO | Admitting: Podiatry

## 2018-07-12 NOTE — Telephone Encounter (Signed)
Copied from [facility] [id number]. Topic: Quick Communication - See Telephone Encounter >> Jul 12, 2018  2:19 PM Ivar Drape wrote: CRM for notification. See Telephone encounter for: 07/12/18. The patient would like the provider to know that his foot surgery is being moved up to Thurs 07/13/18 with Dr. Daylene Katayama.

## 2018-07-12 NOTE — Telephone Encounter (Addendum)
"  This is Jason [name].  I got the report on the culture.  Thank you for that.  The patient is on Doxycycline.  I do see any drugs on his list that will help this bacteria that this patient has.  I don't know if Dr. Amalia Hailey has seen this report.  Make sure he sees this and maybe he wants to add something to the Doxycycline.  I just want to make sure he's aware."  I informed Dr. Amalia Hailey.  He said he would give Jason [name] a prescription after his surgery tomorrow.

## 2018-07-13 ENCOUNTER — Encounter: Payer: Self-pay | Admitting: Podiatry

## 2018-07-13 DIAGNOSIS — M25571 Pain in right ankle and joints of right foot: Secondary | ICD-10-CM | POA: Diagnosis not present

## 2018-07-13 DIAGNOSIS — M2041 Other hammer toe(s) (acquired), right foot: Secondary | ICD-10-CM | POA: Diagnosis not present

## 2018-07-13 DIAGNOSIS — E7800 Pure hypercholesterolemia, unspecified: Secondary | ICD-10-CM | POA: Diagnosis not present

## 2018-07-13 DIAGNOSIS — L97903 Non-pressure chronic ulcer of unspecified part of unspecified lower leg with necrosis of muscle: Secondary | ICD-10-CM | POA: Diagnosis not present

## 2018-07-13 DIAGNOSIS — E11621 Type 2 diabetes mellitus with foot ulcer: Secondary | ICD-10-CM | POA: Diagnosis not present

## 2018-07-13 DIAGNOSIS — L97512 Non-pressure chronic ulcer of other part of right foot with fat layer exposed: Secondary | ICD-10-CM | POA: Diagnosis not present

## 2018-07-13 DIAGNOSIS — M216X1 Other acquired deformities of right foot: Secondary | ICD-10-CM | POA: Diagnosis not present

## 2018-07-13 DIAGNOSIS — L97519 Non-pressure chronic ulcer of other part of right foot with unspecified severity: Secondary | ICD-10-CM | POA: Diagnosis not present

## 2018-07-13 DIAGNOSIS — M21541 Acquired clubfoot, right foot: Secondary | ICD-10-CM | POA: Diagnosis not present

## 2018-07-19 ENCOUNTER — Ambulatory Visit (INDEPENDENT_AMBULATORY_CARE_PROVIDER_SITE_OTHER): Payer: Medicare HMO

## 2018-07-19 ENCOUNTER — Ambulatory Visit (INDEPENDENT_AMBULATORY_CARE_PROVIDER_SITE_OTHER): Payer: Self-pay | Admitting: Podiatry

## 2018-07-19 DIAGNOSIS — L97512 Non-pressure chronic ulcer of other part of right foot with fat layer exposed: Secondary | ICD-10-CM

## 2018-07-19 DIAGNOSIS — Z9889 Other specified postprocedural states: Secondary | ICD-10-CM

## 2018-07-21 ENCOUNTER — Telehealth: Payer: Self-pay | Admitting: *Deleted

## 2018-07-21 ENCOUNTER — Ambulatory Visit (INDEPENDENT_AMBULATORY_CARE_PROVIDER_SITE_OTHER): Payer: Medicare HMO | Admitting: Podiatry

## 2018-07-21 DIAGNOSIS — L97512 Non-pressure chronic ulcer of other part of right foot with fat layer exposed: Secondary | ICD-10-CM

## 2018-07-21 DIAGNOSIS — Z9889 Other specified postprocedural states: Secondary | ICD-10-CM

## 2018-07-21 MED ORDER — CIPROFLOXACIN HCL 500 MG PO TABS: 500.0000 mg | ORAL_TABLET | Freq: Two times a day (BID) | ORAL | 0 refills | Status: DC

## 2018-07-21 NOTE — Progress Notes (Signed)
   Subjective:  Patient presents today status post 2nd and 3rd metatarsal head resection with ulcer debridement of the right foot. DOS: 07/13/18. He states he is doing well overall. He is taking his antibiotics as directed and has been using the post op shoe. There are no modifying factors noted. Patient is here for further evaluation and treatment.   Past Medical History:  Diagnosis Date  . Anxiety   . Asthma    in past, no inhaler now  . Benign neoplasm of colon   . Depression   . Diverticulosis of colon (without mention of hemorrhage)   . DJD (degenerative joint disease)   . Esophageal reflux   . Glaucoma   . Hidradenitis   . History of BPH   . History of gynecomastia    Unilateral  . Hyperlipidemia   . Irritable bowel syndrome   . Memory loss   . MVP (mitral valve prolapse)   . Obesity, unspecified   . Sleep apnea    does not use  cpap or bipap .. no study  ever  . Type II or unspecified type diabetes mellitus without mention of complication, not stated as uncontrolled   . Unspecified venous (peripheral) insufficiency       Objective/Physical Exam Neurovascular status intact.  Skin incisions appear to be well coapted with sutures and staples intact. No sign of infectious process noted. No dehiscence. No active bleeding noted. Moderate edema noted to the surgical extremity.  Wound #1 noted to the right plantar forefoot measuring 1.0 x 1.0 x 4.0 cm.   To the above-noted ulceration, there is no eschar. There is a moderate amount of slough, fibrin and necrotic tissue. Granulation tissue and wound base is red. There is no malodor. There is a minimal amount of serosanginous drainage noted. Periwound integrity is intact.   Radiographic Exam:  Osteotomies sites appear to be stable with routine healing.  Assessment: 1. s/p 2nd and 3rd metatarsal head resection of the right foot. DOS: 07/13/18. 2. Ulceration of the right plantar forefoot secondary to DM   Plan of Care:  1.  Patient was evaluated. X-rays reviewed 2. Dressing changed. New Iodoform packing applied. Keep clean, dry and intact for one week.  3. Continue taking oral antibiotics.  4. Continue weightbearing in post op shoe.  5. Return to clinic in one week.    Edrick Kins, DPM [facility]  Dr. Edrick Kins, [address]                                        [place], [place] [address]                Office [phone]  Fax [phone]

## 2018-07-21 NOTE — Telephone Encounter (Signed)
Dr. Jacqualyn Posey ordered Jason [name] 3 x week for pt - flush wound of right foot with saline, pack with 1/4 inch iodoform gauze packing, paint area around wound with betadine, apply DSD. Orders faxed to [facility] with note Dr.Evans is to follow pt after initiation, Dr. Jacqualyn Posey in the interim.

## 2018-07-22 DIAGNOSIS — Z6831 Body mass index (BMI) 31.0-31.9, adult: Secondary | ICD-10-CM | POA: Diagnosis not present

## 2018-07-22 DIAGNOSIS — F329 Major depressive disorder, single episode, unspecified: Secondary | ICD-10-CM | POA: Diagnosis not present

## 2018-07-22 DIAGNOSIS — E11621 Type 2 diabetes mellitus with foot ulcer: Secondary | ICD-10-CM | POA: Diagnosis not present

## 2018-07-22 DIAGNOSIS — L97512 Non-pressure chronic ulcer of other part of right foot with fat layer exposed: Secondary | ICD-10-CM | POA: Diagnosis not present

## 2018-07-22 DIAGNOSIS — Z7984 Long term (current) use of oral hypoglycemic drugs: Secondary | ICD-10-CM | POA: Diagnosis not present

## 2018-07-22 DIAGNOSIS — I70235 Atherosclerosis of native arteries of right leg with ulceration of other part of foot: Secondary | ICD-10-CM | POA: Diagnosis not present

## 2018-07-22 DIAGNOSIS — C679 Malignant neoplasm of bladder, unspecified: Secondary | ICD-10-CM | POA: Diagnosis not present

## 2018-07-22 DIAGNOSIS — E1151 Type 2 diabetes mellitus with diabetic peripheral angiopathy without gangrene: Secondary | ICD-10-CM | POA: Diagnosis not present

## 2018-07-22 NOTE — Progress Notes (Signed)
Subjective: Jason [name] is a 76 y.o. is seen today in office s/p right 2nd and 3rd metatarsal head resections preformed by Dr. Amalia Hailey.  He presents today as an add-on because there was bleeding and drainage coming from the wound.  He just recently saw Dr. Amalia Hailey on Wednesday and the dressing was changed in the past.  He states he was told to leave the dressing appointment for next week but due to the drainage she decided to come in.  Just recently finished his doxycycline.  Overall he feels well and he denies any systemic complaints such as fevers, chills, nausea, vomiting. No calf pain, chest pain, shortness of breath.   Objective: General: No acute distress, AAOx3  DP/PT pulses palpable 2/4, CRT < 3 sec to all digits.  Protective sensation intact. Motor function intact.  Right foot: Incision is well coapted without any evidence of dehiscence and staples are intact dorsally.  Wound continues in the plantar aspect of the foot which is unchanged in size with packing in place.  I removed this and I waited for some time there is no active bleeding identified however there was quite a bit of strikethrough on the bandage.  There is no surrounding erythema, ascending cellulitis, fluctuance, crepitus, malodor, drainage/purulence around the incision/wound. There is mildedema around the surgical site. There is no pain along the surgical site.  No other areas of tenderness to bilateral lower extremities.  No other open lesions or pre-ulcerative lesions.  No pain with calf compression, swelling, warmth, erythema.   Assessment and Plan:  Status post right foot surgery presenting for drainage  -Treatment options discussed including all alternatives, risks, and complications -Today evaluated there is no active bleeding identified.  The wound was irrigated with saline and repacked with iodoform packing followed by small amount of Betadine around the area and a dry sterile dressing.  We will continue with this and  also try to get home health consult for him for dressing changes.   -I reviewed his previous cultures.  I did change him to ciprofloxacin. -Continue elevation and limit weightbearing-continue weightbearing status per Dr. Amalia Hailey -Pain medication as needed. -Monitor for any clinical signs or symptoms of infection and DVT/PE and directed to call the office immediately should any occur or go to the ER. -Follow-up on Monday with Dr. Amalia Hailey for dressing change or sooner if any problems arise. In the meantime, encouraged to call the office with any questions, concerns, change in symptoms.   Celesta Gentile, DPM

## 2018-07-24 ENCOUNTER — Ambulatory Visit (INDEPENDENT_AMBULATORY_CARE_PROVIDER_SITE_OTHER): Payer: Medicare HMO | Admitting: Podiatry

## 2018-07-24 DIAGNOSIS — Z9889 Other specified postprocedural states: Secondary | ICD-10-CM

## 2018-07-24 DIAGNOSIS — E0842 Diabetes mellitus due to underlying condition with diabetic polyneuropathy: Secondary | ICD-10-CM

## 2018-07-24 DIAGNOSIS — L97512 Non-pressure chronic ulcer of other part of right foot with fat layer exposed: Secondary | ICD-10-CM

## 2018-07-26 ENCOUNTER — Encounter: Payer: Medicare HMO | Admitting: Podiatry

## 2018-07-26 DIAGNOSIS — L97512 Non-pressure chronic ulcer of other part of right foot with fat layer exposed: Secondary | ICD-10-CM | POA: Diagnosis not present

## 2018-07-26 DIAGNOSIS — I70235 Atherosclerosis of native arteries of right leg with ulceration of other part of foot: Secondary | ICD-10-CM | POA: Diagnosis not present

## 2018-07-26 DIAGNOSIS — C679 Malignant neoplasm of bladder, unspecified: Secondary | ICD-10-CM | POA: Diagnosis not present

## 2018-07-26 DIAGNOSIS — Z6831 Body mass index (BMI) 31.0-31.9, adult: Secondary | ICD-10-CM | POA: Diagnosis not present

## 2018-07-26 DIAGNOSIS — E1151 Type 2 diabetes mellitus with diabetic peripheral angiopathy without gangrene: Secondary | ICD-10-CM | POA: Diagnosis not present

## 2018-07-26 DIAGNOSIS — F329 Major depressive disorder, single episode, unspecified: Secondary | ICD-10-CM | POA: Diagnosis not present

## 2018-07-26 DIAGNOSIS — E11621 Type 2 diabetes mellitus with foot ulcer: Secondary | ICD-10-CM | POA: Diagnosis not present

## 2018-07-26 DIAGNOSIS — Z7984 Long term (current) use of oral hypoglycemic drugs: Secondary | ICD-10-CM | POA: Diagnosis not present

## 2018-07-26 NOTE — Progress Notes (Signed)
   Subjective:  Patient presents today status post metatarsal head resection digits 2 and 3 and debridement of an ulceration of the right foot. DOS: 07/13/18. He states he is doing well overall. He states home healthcare has been changing his dressings every two days. There are no complaints or modifying factors noted. He has been taking Clindamycin as directed as well as wearing post op shoe. Patient is here for further evaluation and treatment.    Past Medical History:  Diagnosis Date  . Anxiety   . Asthma    in past, no inhaler now  . Benign neoplasm of colon   . Depression   . Diverticulosis of colon (without mention of hemorrhage)   . DJD (degenerative joint disease)   . Esophageal reflux   . Glaucoma   . Hidradenitis   . History of BPH   . History of gynecomastia    Unilateral  . Hyperlipidemia   . Irritable bowel syndrome   . Memory loss   . MVP (mitral valve prolapse)   . Obesity, unspecified   . Sleep apnea    does not use  cpap or bipap .. no study  ever  . Type II or unspecified type diabetes mellitus without mention of complication, not stated as uncontrolled   . Unspecified venous (peripheral) insufficiency       Objective/Physical Exam Neurovascular status intact.  Skin incisions appear to be well coapted with sutures and staples intact. No sign of infectious process noted. No dehiscence. No active bleeding noted. Moderate edema noted to the surgical extremity.  Assessment: 1. s/p metatarsal head resection digits 2 and 3 and debridement of an ulceration of the right foot. DOS: 07/13/18 2. Ulceration of the right foot secondary to DM  Plan of Care:  1. Patient was evaluated.  2. Dressing changed.  3. Continue home healthcare dressing changes three times weekly.  4. Continue taking oral Clindamycin based on cultures.  5. Continue using post op shoe.  6. Return to clinic in one week.    Edrick Kins, DPM [facility]  Dr. Edrick Kins, [address]                                        [place], [place] [address]                Office [phone]  Fax [phone]

## 2018-07-28 DIAGNOSIS — R310 Gross hematuria: Secondary | ICD-10-CM | POA: Diagnosis not present

## 2018-07-28 DIAGNOSIS — C672 Malignant neoplasm of lateral wall of bladder: Secondary | ICD-10-CM | POA: Diagnosis not present

## 2018-07-28 DIAGNOSIS — I70235 Atherosclerosis of native arteries of right leg with ulceration of other part of foot: Secondary | ICD-10-CM | POA: Diagnosis not present

## 2018-07-28 DIAGNOSIS — E11621 Type 2 diabetes mellitus with foot ulcer: Secondary | ICD-10-CM | POA: Diagnosis not present

## 2018-07-28 DIAGNOSIS — F329 Major depressive disorder, single episode, unspecified: Secondary | ICD-10-CM | POA: Diagnosis not present

## 2018-07-28 DIAGNOSIS — L97512 Non-pressure chronic ulcer of other part of right foot with fat layer exposed: Secondary | ICD-10-CM | POA: Diagnosis not present

## 2018-07-28 DIAGNOSIS — Z7984 Long term (current) use of oral hypoglycemic drugs: Secondary | ICD-10-CM | POA: Diagnosis not present

## 2018-07-28 DIAGNOSIS — C679 Malignant neoplasm of bladder, unspecified: Secondary | ICD-10-CM | POA: Diagnosis not present

## 2018-07-28 DIAGNOSIS — E1151 Type 2 diabetes mellitus with diabetic peripheral angiopathy without gangrene: Secondary | ICD-10-CM | POA: Diagnosis not present

## 2018-07-28 DIAGNOSIS — Z6831 Body mass index (BMI) 31.0-31.9, adult: Secondary | ICD-10-CM | POA: Diagnosis not present

## 2018-07-31 ENCOUNTER — Encounter: Payer: Self-pay | Admitting: Sports Medicine

## 2018-07-31 ENCOUNTER — Ambulatory Visit (INDEPENDENT_AMBULATORY_CARE_PROVIDER_SITE_OTHER): Payer: Self-pay | Admitting: Sports Medicine

## 2018-07-31 DIAGNOSIS — L97512 Non-pressure chronic ulcer of other part of right foot with fat layer exposed: Secondary | ICD-10-CM

## 2018-07-31 DIAGNOSIS — E0842 Diabetes mellitus due to underlying condition with diabetic polyneuropathy: Secondary | ICD-10-CM

## 2018-07-31 DIAGNOSIS — Z9889 Other specified postprocedural states: Secondary | ICD-10-CM

## 2018-07-31 NOTE — Progress Notes (Signed)
Subjective: Jason [name] is a 77 y.o. male patient seen today in office for POV #1 (DOS 07-13-18), S/P Met head resections 2nd and 3rd with debridement of ulceration on the right foot.  Patient reports that he has home nursing assisting with dressing changes every other day and states that he has finished his Ciprofloxin antibiotic as of last night and is wondering if he will need more.  Patient admits that he does have some shooting pain from time to time in his right foot but denies fever, chills, night sweats, vomiting or any other constitutional symptoms at this time.  Patient does admit that he will be going for a chemo treatment on next week.No other issues noted.   Patient Active Problem List   Diagnosis Date Noted  . Malignant neoplasm of urinary bladder ([place]) 07/11/2018  . Chronic idiopathic constipation 06/12/2018  . Diabetic infection of right foot ([place]) 07/12/2017  . Foot ulcer, right, with unspecified severity ([place]) 01/17/2017  . Complex sleep apnea syndrome 01/12/2016  . Diabetes mellitus type 2, uncontrolled, with complications ([place]) 70/35/0093  . OSA (obstructive sleep apnea) 12/18/2015  . Benign prostatic hyperplasia 02/18/2014  . Routine general medical examination at a health care facility 02/18/2014  . PAD (peripheral artery disease) ([place]) 02/18/2014  . DEGENERATIVE JOINT DISEASE 02/20/2008  . Diabetes mellitus type 2 with atherosclerosis of arteries of extremities ([place]) 01/26/2008  . Hyperlipidemia with target LDL less than 70 01/26/2008  . Morbid obesity ([place]) 01/26/2008  . Mitral valve disorder 01/26/2008  . GERD 01/26/2008  . Irritable bowel syndrome 01/26/2008    Current Outpatient Medications on File Prior to Visit  Medication Sig Dispense Refill  . albuterol (PROVENTIL) (2.5 MG/3ML) 0.083% nebulizer solution Take 3 mLs (2.5 mg total) by nebulization every 4 (four) hours as needed for wheezing.    Marland Kitchen aspirin EC 81 MG tablet Take 1 tablet (81 mg total) by mouth  daily. 90 tablet 3  . blood glucose meter kit and supplies KIT Use to test blood sugar up to 3 times daily. DX E11.9 1 each 0  . Blood Glucose Monitoring Suppl (TRUE METRIX METER) w/Device KIT Use to check blood sugar DX E11.8 1 kit 0  . ciprofloxacin (CIPRO) 500 MG tablet Take 1 tablet (500 mg total) by mouth 2 (two) times daily. 20 tablet 0  . dexlansoprazole (DEXILANT) 60 MG capsule Take 1 capsule (60 mg total) by mouth daily. 90 capsule 1  . docusate sodium (COLACE) 100 MG capsule Take 1 capsule (100 mg total) by mouth 2 (two) times daily. 10 capsule 0  . famotidine (PEPCID) 20 MG tablet Take 1 tablet (20 mg total) by mouth 2 (two) times daily. 10 tablet 0  . glimepiride (AMARYL) 2 MG tablet TAKE 1 TABLET EVERY DAY WITH BREAKFAST (Patient taking differently: Take 2 mg by mouth daily with breakfast. ) 90 tablet 1  . glucose blood (TRUE METRIX BLOOD GLUCOSE TEST) test strip Use to test blood sugar twice daily. DX: E11.9 200 each 3  . linaclotide (LINZESS) 145 MCG CAPS capsule Take 1 capsule (145 mcg total) by mouth daily before breakfast. 90 capsule 1  . metFORMIN (GLUCOPHAGE) 500 MG tablet Take 2 tablets (1,000 mg total) by mouth 2 (two) times daily. 360 tablet 1  . phenazopyridine (PYRIDIUM) 200 MG tablet Take 1 tablet (200 mg total) by mouth 3 (three) times daily as needed for pain. 10 tablet 0  . polyethylene glycol (MIRALAX / GLYCOLAX) packet Take 17 g by mouth daily.  14 each 0  . simvastatin (ZOCOR) 40 MG tablet Take 40 mg by mouth every evening.    . traMADol (ULTRAM) 50 MG tablet Take 1-2 tablets (50-100 mg total) by mouth every 6 (six) hours as needed for moderate pain. 15 tablet 0  . TRUEPLUS LANCETS 33G MISC Use to test blood sugar twice daily. DX: E11.9 200 each 3  . vitamin C (ASCORBIC ACID) 500 MG tablet Take 500 mg by mouth daily.      No current facility-administered medications on file prior to visit.     Allergies  Allergen Reactions  . Bactrim  [Sulfamethoxazole-Trimethoprim] Rash    Objective: There were no vitals filed for this visit.  General: No acute distress, AAOx3  Right foot: Staples and sutures intact with no gapping or dehiscence at surgical site dorsally, there is a full-thickness ulceration at the plantar aspect of the right forefoot with a granular bloody draining base that measures 1 cm x 0.9 cm x 3 cm probes to soft tissue in the range to the dorsal wound with mild periwound maceration, mild swelling to right foot, no erythema, no warmth, no other signs of infection noted, Capillary fill time <5 seconds in all remaining digits, protective sensation diminished right foot. No pain or crepitation with range of motion right foot.  Amputation status of toes 1 through 3 on right.  No pain with calf compression.   Assessment and Plan:  Problem List Items Addressed This Visit    None    Visit Diagnoses    Ulcer of right foot with fat layer exposed ([place])    -  Primary   Relevant Orders   WOUND CULTURE (Completed)   Status post right foot surgery       Diabetes mellitus due to underlying condition with diabetic polyneuropathy, unspecified whether long term insulin use ([name])          -Patient seen and evaluated -Dressing change performed -Applied iodoform packing and dry sterile dressing to surgical site right foot secured with ACE wrap and stockinet  -Advised patient to make sure to keep dressings clean, dry, and intact to right t surgical site allowing nursing to change dressings as ordered using packing to site -Previous wound culture results reviewed. Advised patient at this time the wound does not appear acutely infected and does not require a refill on the ciprofloxacin antibiotic however did obtain a culture for surveillance and advised patient if comes back again positive will resume antibiotics -Advised patient to continue with post-op shoe on right foot. -Advised patient to limit activity to necessity  -Advised  patient to elevate as necessary and to monitor closely for any signs of infection if worsens return to office or report to ER -Patient to follow back up with Dr. Amalia Hailey on next week for continued postoperative wound care. In the meantime, patient to call office if any issues or problems arise.   Landis Martins, DPM

## 2018-08-02 DIAGNOSIS — C679 Malignant neoplasm of bladder, unspecified: Secondary | ICD-10-CM | POA: Diagnosis not present

## 2018-08-02 DIAGNOSIS — E11621 Type 2 diabetes mellitus with foot ulcer: Secondary | ICD-10-CM | POA: Diagnosis not present

## 2018-08-02 DIAGNOSIS — I70235 Atherosclerosis of native arteries of right leg with ulceration of other part of foot: Secondary | ICD-10-CM | POA: Diagnosis not present

## 2018-08-02 DIAGNOSIS — F329 Major depressive disorder, single episode, unspecified: Secondary | ICD-10-CM | POA: Diagnosis not present

## 2018-08-02 DIAGNOSIS — E1151 Type 2 diabetes mellitus with diabetic peripheral angiopathy without gangrene: Secondary | ICD-10-CM | POA: Diagnosis not present

## 2018-08-02 DIAGNOSIS — Z7984 Long term (current) use of oral hypoglycemic drugs: Secondary | ICD-10-CM | POA: Diagnosis not present

## 2018-08-02 DIAGNOSIS — Z6831 Body mass index (BMI) 31.0-31.9, adult: Secondary | ICD-10-CM | POA: Diagnosis not present

## 2018-08-02 DIAGNOSIS — L97512 Non-pressure chronic ulcer of other part of right foot with fat layer exposed: Secondary | ICD-10-CM | POA: Diagnosis not present

## 2018-08-03 LAB — WOUND CULTURE
MICRO NUMBER:: 91107719
SPECIMEN QUALITY: ADEQUATE

## 2018-08-04 ENCOUNTER — Telehealth: Payer: Self-pay | Admitting: *Deleted

## 2018-08-04 DIAGNOSIS — I70235 Atherosclerosis of native arteries of right leg with ulceration of other part of foot: Secondary | ICD-10-CM | POA: Diagnosis not present

## 2018-08-04 DIAGNOSIS — F329 Major depressive disorder, single episode, unspecified: Secondary | ICD-10-CM | POA: Diagnosis not present

## 2018-08-04 DIAGNOSIS — E1151 Type 2 diabetes mellitus with diabetic peripheral angiopathy without gangrene: Secondary | ICD-10-CM | POA: Diagnosis not present

## 2018-08-04 DIAGNOSIS — Z7984 Long term (current) use of oral hypoglycemic drugs: Secondary | ICD-10-CM | POA: Diagnosis not present

## 2018-08-04 DIAGNOSIS — E11621 Type 2 diabetes mellitus with foot ulcer: Secondary | ICD-10-CM | POA: Diagnosis not present

## 2018-08-04 DIAGNOSIS — Z6831 Body mass index (BMI) 31.0-31.9, adult: Secondary | ICD-10-CM | POA: Diagnosis not present

## 2018-08-04 DIAGNOSIS — C679 Malignant neoplasm of bladder, unspecified: Secondary | ICD-10-CM | POA: Diagnosis not present

## 2018-08-04 DIAGNOSIS — L97512 Non-pressure chronic ulcer of other part of right foot with fat layer exposed: Secondary | ICD-10-CM | POA: Diagnosis not present

## 2018-08-04 NOTE — Telephone Encounter (Signed)
-----   Message from Landis Martins, [place] sent at 08/03/2018 12:45 PM EDT ----- Let patient know, No growth no culture. No additional antibiotics needed at this time. -Dr. Cannon Kettle

## 2018-08-04 NOTE — Telephone Encounter (Signed)
Left message on pt's home phone infoarming of Dr. Leeanne Rio review of results and orders, and that I left them on his voicemail because I didn't want him to worry over the weekend.

## 2018-08-07 ENCOUNTER — Encounter: Payer: Medicare HMO | Admitting: Podiatry

## 2018-08-07 DIAGNOSIS — Z6831 Body mass index (BMI) 31.0-31.9, adult: Secondary | ICD-10-CM | POA: Diagnosis not present

## 2018-08-07 DIAGNOSIS — C672 Malignant neoplasm of lateral wall of bladder: Secondary | ICD-10-CM | POA: Diagnosis not present

## 2018-08-07 DIAGNOSIS — Z7984 Long term (current) use of oral hypoglycemic drugs: Secondary | ICD-10-CM | POA: Diagnosis not present

## 2018-08-07 DIAGNOSIS — C679 Malignant neoplasm of bladder, unspecified: Secondary | ICD-10-CM | POA: Diagnosis not present

## 2018-08-07 DIAGNOSIS — Z5111 Encounter for antineoplastic chemotherapy: Secondary | ICD-10-CM | POA: Diagnosis not present

## 2018-08-07 DIAGNOSIS — L97512 Non-pressure chronic ulcer of other part of right foot with fat layer exposed: Secondary | ICD-10-CM | POA: Diagnosis not present

## 2018-08-07 DIAGNOSIS — E11621 Type 2 diabetes mellitus with foot ulcer: Secondary | ICD-10-CM | POA: Diagnosis not present

## 2018-08-07 DIAGNOSIS — E1151 Type 2 diabetes mellitus with diabetic peripheral angiopathy without gangrene: Secondary | ICD-10-CM | POA: Diagnosis not present

## 2018-08-07 DIAGNOSIS — F329 Major depressive disorder, single episode, unspecified: Secondary | ICD-10-CM | POA: Diagnosis not present

## 2018-08-07 DIAGNOSIS — I70235 Atherosclerosis of native arteries of right leg with ulceration of other part of foot: Secondary | ICD-10-CM | POA: Diagnosis not present

## 2018-08-08 ENCOUNTER — Encounter: Payer: Medicare HMO | Admitting: Sports Medicine

## 2018-08-09 ENCOUNTER — Ambulatory Visit (INDEPENDENT_AMBULATORY_CARE_PROVIDER_SITE_OTHER): Payer: Medicare HMO | Admitting: Podiatry

## 2018-08-09 DIAGNOSIS — I70235 Atherosclerosis of native arteries of right leg with ulceration of other part of foot: Secondary | ICD-10-CM

## 2018-08-09 DIAGNOSIS — Z9889 Other specified postprocedural states: Secondary | ICD-10-CM

## 2018-08-09 DIAGNOSIS — L97512 Non-pressure chronic ulcer of other part of right foot with fat layer exposed: Secondary | ICD-10-CM

## 2018-08-09 DIAGNOSIS — E0842 Diabetes mellitus due to underlying condition with diabetic polyneuropathy: Secondary | ICD-10-CM | POA: Diagnosis not present

## 2018-08-09 NOTE — Progress Notes (Signed)
   Subjective:  Patient presents today status post 2nd and 3rd metatarsal head resections of the right foot. DOS: 07/13/18. He states he is doing well. He denies significant pain or modifying factors. He has been using the post op shoe as directed without issue. Patient is here for further evaluation and treatment.    Past Medical History:  Diagnosis Date  . Anxiety   . Asthma    in past, no inhaler now  . Benign neoplasm of colon   . Depression   . Diverticulosis of colon (without mention of hemorrhage)   . DJD (degenerative joint disease)   . Esophageal reflux   . Glaucoma   . Hidradenitis   . History of BPH   . History of gynecomastia    Unilateral  . Hyperlipidemia   . Irritable bowel syndrome   . Memory loss   . MVP (mitral valve prolapse)   . Obesity, unspecified   . Sleep apnea    does not use  cpap or bipap .. no study  ever  . Type II or unspecified type diabetes mellitus without mention of complication, not stated as uncontrolled   . Unspecified venous (peripheral) insufficiency       Objective/Physical Exam Neurovascular status intact.  Skin incisions appear to be well coapted with sutures and staples intact. No sign of infectious process noted. No dehiscence. No active bleeding noted. Moderate edema noted to the surgical extremity.  Ulceration of the right plantar forefoot measuring 0.5 x 0.2 x 0.3 cm.   To the above-noted ulceration, there is no eschar. There is a moderate amount of slough, fibrin and necrotic tissue. Granulation tissue and wound base is red. There is no malodor. There is a minimal amount of serosanginous drainage noted. Periwound integrity is intact.   Assessment: 1. s/p 2nd and 3rd metatarsal head resections of the right foot. DOS: 07/13/18 2. Ulceration of the right plantar forefoot secondary to diabetes mellitus    Plan of Care:  1. Patient was evaluated.  2. Medically necessary excisional debridement including subcutaneous tissue was  performed using a tissue nipper and a chisel blade. Excisional debridement of all the necrotic nonviable tissue down to healthy bleeding viable tissue was performed with post-debridement measurements same as pre-. 3. The wound was cleansed and dry sterile dressing applied. 4. Staples/sutures removed.  5. Continue home healthcare dressing changes.  6. Continue weightbearing in post op shoe.  7. Return to clinic in 2 weeks.    Edrick Kins, DPM [facility]  Dr. Edrick Kins, [address]                                        [place], [place] [address]                Office [phone]  Fax [phone]

## 2018-08-11 DIAGNOSIS — Z7984 Long term (current) use of oral hypoglycemic drugs: Secondary | ICD-10-CM | POA: Diagnosis not present

## 2018-08-11 DIAGNOSIS — E11621 Type 2 diabetes mellitus with foot ulcer: Secondary | ICD-10-CM | POA: Diagnosis not present

## 2018-08-11 DIAGNOSIS — Z6831 Body mass index (BMI) 31.0-31.9, adult: Secondary | ICD-10-CM | POA: Diagnosis not present

## 2018-08-11 DIAGNOSIS — I70235 Atherosclerosis of native arteries of right leg with ulceration of other part of foot: Secondary | ICD-10-CM | POA: Diagnosis not present

## 2018-08-11 DIAGNOSIS — E1151 Type 2 diabetes mellitus with diabetic peripheral angiopathy without gangrene: Secondary | ICD-10-CM | POA: Diagnosis not present

## 2018-08-11 DIAGNOSIS — C679 Malignant neoplasm of bladder, unspecified: Secondary | ICD-10-CM | POA: Diagnosis not present

## 2018-08-11 DIAGNOSIS — L97512 Non-pressure chronic ulcer of other part of right foot with fat layer exposed: Secondary | ICD-10-CM | POA: Diagnosis not present

## 2018-08-11 DIAGNOSIS — F329 Major depressive disorder, single episode, unspecified: Secondary | ICD-10-CM | POA: Diagnosis not present

## 2018-08-14 DIAGNOSIS — Z5111 Encounter for antineoplastic chemotherapy: Secondary | ICD-10-CM | POA: Diagnosis not present

## 2018-08-14 DIAGNOSIS — C672 Malignant neoplasm of lateral wall of bladder: Secondary | ICD-10-CM | POA: Diagnosis not present

## 2018-08-16 DIAGNOSIS — Z7984 Long term (current) use of oral hypoglycemic drugs: Secondary | ICD-10-CM | POA: Diagnosis not present

## 2018-08-16 DIAGNOSIS — C679 Malignant neoplasm of bladder, unspecified: Secondary | ICD-10-CM | POA: Diagnosis not present

## 2018-08-16 DIAGNOSIS — E11621 Type 2 diabetes mellitus with foot ulcer: Secondary | ICD-10-CM | POA: Diagnosis not present

## 2018-08-16 DIAGNOSIS — L97512 Non-pressure chronic ulcer of other part of right foot with fat layer exposed: Secondary | ICD-10-CM | POA: Diagnosis not present

## 2018-08-16 DIAGNOSIS — E1151 Type 2 diabetes mellitus with diabetic peripheral angiopathy without gangrene: Secondary | ICD-10-CM | POA: Diagnosis not present

## 2018-08-16 DIAGNOSIS — I70235 Atherosclerosis of native arteries of right leg with ulceration of other part of foot: Secondary | ICD-10-CM | POA: Diagnosis not present

## 2018-08-16 DIAGNOSIS — Z6831 Body mass index (BMI) 31.0-31.9, adult: Secondary | ICD-10-CM | POA: Diagnosis not present

## 2018-08-16 DIAGNOSIS — F329 Major depressive disorder, single episode, unspecified: Secondary | ICD-10-CM | POA: Diagnosis not present

## 2018-08-17 NOTE — Progress Notes (Signed)
DOS: 07-13-2018 RT; Metatarsal head resection, Excisional debridement of ulcer Dr. Amalia Hailey   [facility]

## 2018-08-21 DIAGNOSIS — F329 Major depressive disorder, single episode, unspecified: Secondary | ICD-10-CM | POA: Diagnosis not present

## 2018-08-21 DIAGNOSIS — I70235 Atherosclerosis of native arteries of right leg with ulceration of other part of foot: Secondary | ICD-10-CM | POA: Diagnosis not present

## 2018-08-21 DIAGNOSIS — E1151 Type 2 diabetes mellitus with diabetic peripheral angiopathy without gangrene: Secondary | ICD-10-CM | POA: Diagnosis not present

## 2018-08-21 DIAGNOSIS — C679 Malignant neoplasm of bladder, unspecified: Secondary | ICD-10-CM | POA: Diagnosis not present

## 2018-08-21 DIAGNOSIS — Z5111 Encounter for antineoplastic chemotherapy: Secondary | ICD-10-CM | POA: Diagnosis not present

## 2018-08-21 DIAGNOSIS — L97512 Non-pressure chronic ulcer of other part of right foot with fat layer exposed: Secondary | ICD-10-CM | POA: Diagnosis not present

## 2018-08-21 DIAGNOSIS — Z7984 Long term (current) use of oral hypoglycemic drugs: Secondary | ICD-10-CM | POA: Diagnosis not present

## 2018-08-21 DIAGNOSIS — E11621 Type 2 diabetes mellitus with foot ulcer: Secondary | ICD-10-CM | POA: Diagnosis not present

## 2018-08-21 DIAGNOSIS — Z6831 Body mass index (BMI) 31.0-31.9, adult: Secondary | ICD-10-CM | POA: Diagnosis not present

## 2018-08-21 DIAGNOSIS — C672 Malignant neoplasm of lateral wall of bladder: Secondary | ICD-10-CM | POA: Diagnosis not present

## 2018-08-22 ENCOUNTER — Other Ambulatory Visit: Payer: Self-pay | Admitting: Internal Medicine

## 2018-08-22 DIAGNOSIS — E1151 Type 2 diabetes mellitus with diabetic peripheral angiopathy without gangrene: Secondary | ICD-10-CM

## 2018-08-22 DIAGNOSIS — E118 Type 2 diabetes mellitus with unspecified complications: Secondary | ICD-10-CM

## 2018-08-22 DIAGNOSIS — I70209 Unspecified atherosclerosis of native arteries of extremities, unspecified extremity: Principal | ICD-10-CM

## 2018-08-22 DIAGNOSIS — E1165 Type 2 diabetes mellitus with hyperglycemia: Secondary | ICD-10-CM

## 2018-08-22 DIAGNOSIS — IMO0002 Reserved for concepts with insufficient information to code with codable children: Secondary | ICD-10-CM

## 2018-08-23 ENCOUNTER — Ambulatory Visit (INDEPENDENT_AMBULATORY_CARE_PROVIDER_SITE_OTHER): Payer: Medicare HMO | Admitting: Podiatry

## 2018-08-23 ENCOUNTER — Ambulatory Visit: Payer: Self-pay

## 2018-08-23 DIAGNOSIS — I70235 Atherosclerosis of native arteries of right leg with ulceration of other part of foot: Secondary | ICD-10-CM

## 2018-08-23 DIAGNOSIS — Z9889 Other specified postprocedural states: Secondary | ICD-10-CM

## 2018-08-23 DIAGNOSIS — E0842 Diabetes mellitus due to underlying condition with diabetic polyneuropathy: Secondary | ICD-10-CM

## 2018-08-28 DIAGNOSIS — Z5111 Encounter for antineoplastic chemotherapy: Secondary | ICD-10-CM | POA: Diagnosis not present

## 2018-08-28 DIAGNOSIS — C672 Malignant neoplasm of lateral wall of bladder: Secondary | ICD-10-CM | POA: Diagnosis not present

## 2018-08-28 NOTE — Progress Notes (Signed)
   Subjective:  Patient presents today status post 2nd and 3rd metatarsal head resections of the right foot. DOS: 07/13/18. He states he is doing well. He reports the incision looks well and healed. He denies any significant pain or modifying factors. Patient is here for further evaluation and treatment.    Past Medical History:  Diagnosis Date  . Anxiety   . Asthma    in past, no inhaler now  . Benign neoplasm of colon   . Depression   . Diverticulosis of colon (without mention of hemorrhage)   . DJD (degenerative joint disease)   . Esophageal reflux   . Glaucoma   . Hidradenitis   . History of BPH   . History of gynecomastia    Unilateral  . Hyperlipidemia   . Irritable bowel syndrome   . Memory loss   . MVP (mitral valve prolapse)   . Obesity, unspecified   . Sleep apnea    does not use  cpap or bipap .. no study  ever  . Type II or unspecified type diabetes mellitus without mention of complication, not stated as uncontrolled   . Unspecified venous (peripheral) insufficiency       Objective/Physical Exam Neurovascular status intact.  Skin incisions appear to be well coapted. No sign of infectious process noted. No dehiscence. No active bleeding noted. Moderate edema noted to the surgical extremity.  Wound noted to the right plantar forefoot has healed. Complete re-epithelialization has occurred. No drainage noted.   Assessment: 1. s/p 2nd and 3rd metatarsal head resections of the right foot. DOS: 07/13/18 2. Ulceration of the right plantar forefoot secondary to diabetes mellitus - healed    Plan of Care:  1. Patient was evaluated.  2. Light debridement of right plantar foot with a chisel blade.  3. Continue wearing DM shoes.  4. Return to clinic in 3 months.     Edrick Kins, DPM [facility]  Dr. Edrick Kins, [place]                                        [place], [place] [address]                Office [phone]  Fax  [phone]

## 2018-08-29 DIAGNOSIS — I70235 Atherosclerosis of native arteries of right leg with ulceration of other part of foot: Secondary | ICD-10-CM | POA: Diagnosis not present

## 2018-08-29 DIAGNOSIS — E1151 Type 2 diabetes mellitus with diabetic peripheral angiopathy without gangrene: Secondary | ICD-10-CM | POA: Diagnosis not present

## 2018-08-29 DIAGNOSIS — Z7984 Long term (current) use of oral hypoglycemic drugs: Secondary | ICD-10-CM | POA: Diagnosis not present

## 2018-08-29 DIAGNOSIS — C679 Malignant neoplasm of bladder, unspecified: Secondary | ICD-10-CM | POA: Diagnosis not present

## 2018-08-29 DIAGNOSIS — Z6831 Body mass index (BMI) 31.0-31.9, adult: Secondary | ICD-10-CM | POA: Diagnosis not present

## 2018-08-29 DIAGNOSIS — L97512 Non-pressure chronic ulcer of other part of right foot with fat layer exposed: Secondary | ICD-10-CM | POA: Diagnosis not present

## 2018-08-29 DIAGNOSIS — E11621 Type 2 diabetes mellitus with foot ulcer: Secondary | ICD-10-CM | POA: Diagnosis not present

## 2018-08-29 DIAGNOSIS — F329 Major depressive disorder, single episode, unspecified: Secondary | ICD-10-CM | POA: Diagnosis not present

## 2018-08-30 ENCOUNTER — Encounter: Payer: Self-pay | Admitting: Family

## 2018-08-30 ENCOUNTER — Ambulatory Visit (INDEPENDENT_AMBULATORY_CARE_PROVIDER_SITE_OTHER): Payer: Medicare HMO | Admitting: Family

## 2018-08-30 VITALS — BP 112/68 | HR 69 | Temp 97.7°F | Ht >= 80 in | Wt 293.0 lb

## 2018-08-30 DIAGNOSIS — H9313 Tinnitus, bilateral: Secondary | ICD-10-CM | POA: Diagnosis not present

## 2018-08-30 NOTE — Patient Instructions (Signed)
Tinnitus Tinnitus refers to hearing a sound when there is no actual source for that sound. This is often described as ringing in the ears. However, people with this condition may hear a variety of noises. A person may hear the sound in one ear or in both ears. The sounds of tinnitus can be soft, loud, or somewhere in between. Tinnitus can last for a few seconds or can be constant for days. It may go away without treatment and come back at various times. When tinnitus is constant or happens often, it can lead to other problems, such as trouble sleeping and trouble concentrating. Almost everyone experiences tinnitus at some point. Tinnitus that is long-lasting (chronic) or comes back often is a problem that may require medical attention. What are the causes? The cause of tinnitus is often not known. In some cases, it can result from other problems or conditions, including:  Exposure to loud noises from machinery, music, or other sources.  Hearing loss.  Ear or sinus infections.  Earwax buildup.  A foreign object in the ear.  Use of certain medicines.  Use of alcohol and caffeine.  High blood pressure.  Heart diseases.  Anemia.  Allergies.  Meniere disease.  Thyroid problems.  Tumors.  An enlarged part of a weakened blood vessel (aneurysm).  What are the signs or symptoms? The main symptom of tinnitus is hearing a sound when there is no source for that sound. It may sound like:  Buzzing.  Roaring.  Ringing.  Blowing air, similar to the sound heard when you listen to a seashell.  Hissing.  Whistling.  Sizzling.  Humming.  Running water.  A sustained musical note.  How is this diagnosed? Tinnitus is diagnosed based on your symptoms. Your health care provider will do a physical exam. A comprehensive hearing exam (audiologic exam) will be done if your tinnitus:  Affects only one ear (unilateral).  Causes hearing difficulties.  Lasts 6 months or  longer.  You may also need to see a health care provider who specializes in hearing disorders (audiologist). You may be asked to complete a questionnaire to determine the severity of your tinnitus. Tests may be done to help determine the cause and to rule out other conditions. These can include:  Imaging studies of your head and brain, such as: ? A CT scan. ? An MRI.  An imaging study of your blood vessels (angiogram).  How is this treated? Treating an underlying medical condition can sometimes make tinnitus go away. If your tinnitus continues, other treatments may include:  Medicines, such as certain antidepressants or sleeping aids.  Sound generators to mask the tinnitus. These include: ? Tabletop sound machines that play relaxing sounds to help you fall asleep. ? Wearable devices that fit in your ear and play sounds or music. ? A small device that uses headphones to deliver a signal embedded in music (acoustic neural stimulation). In time, this may change the pathways of your brain and make you less sensitive to tinnitus. This device is used for very severe cases when no other treatment is working.  Therapy and counseling to help you manage the stress of living with tinnitus.  Using hearing aids or cochlear implants, if your tinnitus is related to hearing loss.  Follow these instructions at home:  When possible, avoid being in loud places and being exposed to loud sounds.  Wear hearing protection, such as earplugs, when you are exposed to loud noises.  Do not take stimulants, such as nicotine,   alcohol, or caffeine.  Practice techniques for reducing stress, such as meditation, yoga, or deep breathing.  Use a white noise machine, a humidifier, or other devices to mask the sound of tinnitus.  Sleep with your head slightly raised. This may reduce the impact of tinnitus.  Try to get plenty of rest each night. Contact a health care provider if:  You have tinnitus in just one  ear.  Your tinnitus continues for 3 weeks or longer without stopping.  Home care measures are not helping.  You have tinnitus after a head injury.  You have tinnitus along with any of the following: ? Dizziness. ? Loss of balance. ? Nausea and vomiting. This information is not intended to replace advice given to you by your health care provider. Make sure you discuss any questions you have with your health care provider. Document Released: 11/01/2005 Document Revised: 07/04/2016 Document Reviewed: 04/03/2014 Elsevier Interactive Patient Education  2018 Elsevier Inc.  

## 2018-08-30 NOTE — Progress Notes (Signed)
Jason [name] is a 77 y.o. male with the following history as recorded in EpicCare:  Patient Active Problem List   Diagnosis Date Noted  . Malignant neoplasm of urinary bladder ([place]) 07/11/2018  . Chronic idiopathic constipation 06/12/2018  . Diabetic infection of right foot ([place]) 07/12/2017  . Foot ulcer, right, with unspecified severity ([place]) 01/17/2017  . Complex sleep apnea syndrome 01/12/2016  . Diabetes mellitus type 2, uncontrolled, with complications ([place]) 66/04/3015  . OSA (obstructive sleep apnea) 12/18/2015  . Benign prostatic hyperplasia 02/18/2014  . Routine general medical examination at a health care facility 02/18/2014  . PAD (peripheral artery disease) ([place]) 02/18/2014  . DEGENERATIVE JOINT DISEASE 02/20/2008  . Diabetes mellitus type 2 with atherosclerosis of arteries of extremities ([place]) 01/26/2008  . Hyperlipidemia with target LDL less than 70 01/26/2008  . Morbid obesity ([place]) 01/26/2008  . Mitral valve disorder 01/26/2008  . GERD 01/26/2008  . Irritable bowel syndrome 01/26/2008    Current Outpatient Medications  Medication Sig Dispense Refill  . albuterol (PROVENTIL) (2.5 MG/3ML) 0.083% nebulizer solution Take 3 mLs (2.5 mg total) by nebulization every 4 (four) hours as needed for wheezing.    Marland Kitchen aspirin EC 81 MG tablet Take 1 tablet (81 mg total) by mouth daily. 90 tablet 3  . blood glucose meter kit and supplies KIT Use to test blood sugar up to 3 times daily. DX E11.9 1 each 0  . Blood Glucose Monitoring Suppl (TRUE METRIX METER) w/Device KIT Use to check blood sugar DX E11.8 1 kit 0  . famotidine (PEPCID) 20 MG tablet Take 1 tablet (20 mg total) by mouth 2 (two) times daily. 10 tablet 0  . glimepiride (AMARYL) 2 MG tablet TAKE 1 TABLET EVERY DAY WITH BREAKFAST 90 tablet 1  . glucose blood (TRUE METRIX BLOOD GLUCOSE TEST) test strip Use to test blood sugar twice daily. DX: E11.9 200 each 3  . metFORMIN (GLUCOPHAGE) 500 MG tablet Take 2 tablets (1,000 mg  total) by mouth 2 (two) times daily. 360 tablet 1  . polyethylene glycol (MIRALAX / GLYCOLAX) packet Take 17 g by mouth daily. 14 each 0  . simvastatin (ZOCOR) 40 MG tablet TAKE 1 TABLET EVERY EVENING 90 tablet 1  . TRUEPLUS LANCETS 33G MISC Use to test blood sugar twice daily. DX: E11.9 200 each 3  . vitamin C (ASCORBIC ACID) 500 MG tablet Take 500 mg by mouth daily.     Marland Kitchen dexlansoprazole (DEXILANT) 60 MG capsule Take 1 capsule (60 mg total) by mouth daily. (Patient not taking: Reported on 08/30/2018) 90 capsule 1  . docusate sodium (COLACE) 100 MG capsule Take 1 capsule (100 mg total) by mouth 2 (two) times daily. (Patient not taking: Reported on 08/30/2018) 10 capsule 0  . linaclotide (LINZESS) 145 MCG CAPS capsule Take 1 capsule (145 mcg total) by mouth daily before breakfast. (Patient not taking: Reported on 08/30/2018) 90 capsule 1  . phenazopyridine (PYRIDIUM) 200 MG tablet Take 1 tablet (200 mg total) by mouth 3 (three) times daily as needed for pain. (Patient not taking: Reported on 08/30/2018) 10 tablet 0  . traMADol (ULTRAM) 50 MG tablet Take 1-2 tablets (50-100 mg total) by mouth every 6 (six) hours as needed for moderate pain. (Patient not taking: Reported on 08/30/2018) 15 tablet 0   No current facility-administered medications for this visit.     Allergies: Bactrim [sulfamethoxazole-trimethoprim]  Past Medical History:  Diagnosis Date  . Anxiety   . Asthma    in past,  no inhaler now  . Benign neoplasm of colon   . Depression   . Diverticulosis of colon (without mention of hemorrhage)   . DJD (degenerative joint disease)   . Esophageal reflux   . Glaucoma   . Hidradenitis   . History of BPH   . History of gynecomastia    Unilateral  . Hyperlipidemia   . Irritable bowel syndrome   . Memory loss   . MVP (mitral valve prolapse)   . Obesity, unspecified   . Sleep apnea    does not use  cpap or bipap .. no study  ever  . Type II or unspecified type diabetes mellitus  without mention of complication, not stated as uncontrolled   . Unspecified venous (peripheral) insufficiency     Past Surgical History:  Procedure Laterality Date  . AMPUTATION  08/09/2012   Procedure: AMPUTATION DIGIT;  Surgeon: Newt Minion, MD;  Location: [facility];  Service: Orthopedics;  Laterality: Right;  Amputation right Great Toe MTP Joint  . AMPUTATION  10/17/2012   Procedure: AMPUTATION DIGIT;  Surgeon: Newt Minion, MD;  Location: [facility];  Service: Orthopedics;  Laterality: Right;  Right Foot 3rd Toe Amputation at Metatarsophalangeal Joint  . CHOLECYSTECTOMY N/A 06/14/2018   Procedure: LAPAROSCOPIC CHOLECYSTECTOMY WITH INTRAOPERATIVE CHOLANGIOGRAM;  Surgeon: Kieth Brightly Arta Bruce, MD;  Location: [facility];  Service: General;  Laterality: N/A;  . CIRCUMCISION  06/2100   For Phimosis - Dr Hartley Barefoot  . COLON SURGERY    . CYSTOSCOPY W/ RETROGRADES Bilateral 07/07/2018   Procedure: CYSTOSCOPY WITH BILATERAL RETROGRADE PYELOGRAM;  Surgeon: Ardis Hughs, MD;  Location: [facility];  Service: Urology;  Laterality: Bilateral;  . KNEE ARTHROSCOPY    . LAPAROSCOPIC LYSIS OF ADHESIONS  06/14/2018   Procedure: LAPAROSCOPIC LYSIS OF ADHESIONS;  Surgeon: Kieth Brightly Arta Bruce, MD;  Location: [facility];  Service: General;;  . LAPAROTOMY  07/20/11  . LASER ABLATION  06/2009   Left Greater Saphenous vein -Dr Donnetta Hutching  . TOE AMPUTATION  06/2009   x3Right foot second toe -Dr Beola Cord  . TONSILLECTOMY    . TRANSURETHRAL RESECTION OF BLADDER TUMOR N/A 07/07/2018   Procedure: TRANSURETHRAL RESECTION OF BLADDER TUMOR (TURBT) WITH POST OP INSTILLATION OF GEMCITABIN;  Surgeon: Ardis Hughs, MD;  Location: [facility];  Service: Urology;  Laterality: N/A;    Family History  Problem Relation Age of Onset  . Diabetes Father   . Cancer Father        Prostate  . Stroke Father   . Prostate cancer Father   . Colon cancer Neg Hx   . Esophageal cancer Neg Hx   . Rectal cancer Neg Hx   . Stomach cancer Neg Hx      Social History   Tobacco Use  . Smoking status: Former Smoker    Packs/day: 0.50    Years: 5.00    Pack years: 2.50    Types: Cigarettes    Last attempt to quit: 09/05/2015    Years since quitting: 2.9  . Smokeless tobacco: Never Used  . Tobacco comment: He quit in 1973.  Smoked for one year between 2015 and 2015  Substance Use Topics  . Alcohol use: Yes    Alcohol/week: 7.0 standard drinks    Types: 7 Cans of beer per week    Comment: not every week     Subjective:  Patient presents with concerns for ringing in his ears since end of August/ early September; is now taking chemotherapy for  bladder cancer; does not remember if the ringing in his ears started before or after beginning the chemotherapy. Does feel that his hearing is down some recently as well; has 2 more weeks of chemotherapy and will have completed his entire course.   Plans to get his flu shot at a local pharmacy;     Objective:  Vitals:   08/30/18 1317  BP: 112/68  Pulse: 69  Temp: 97.7 F (36.5 C)  TempSrc: Oral  SpO2: 97%  Weight: 293 lb 0.6 oz (132.9 kg)  Height: _0  (2.032 m)    General: Well developed, well nourished, in no acute distress  Skin : Warm and dry.  Head: Normocephalic and atraumatic  Eyes: Sclera and conjunctiva clear; pupils round and reactive to light; extraocular movements intact  Ears: External normal; canals clear; tympanic membranes normal  Oropharynx: Pink, supple. No suspicious lesions  Neck: Supple without thyromegaly, adenopathy  Lungs: Respirations unlabored; clear to auscultation bilaterally without wheeze, rales, rhonchi  CVS exam: normal rate and regular rhythm.  Neurologic: Alert and oriented; speech intact; face symmetrical; moves all extremities well; CNII-XII intact without focal deficit   Assessment:  1. Tinnitus of both ears     Plan:  ? Short-term complication of current chemotherapy regimen; discussed having patient see ENT for hearing evaluation;  follow-up to be determined. Encouraged to schedule with his PCP for diabetes follow-up in December 2019.  No follow-ups on file.  Orders Placed This Encounter  Procedures  . Ambulatory referral to ENT    Referral Priority:   Routine    Referral Type:   Consultation    Referral Reason:   Specialty Services Required    Requested Specialty:   Otolaryngology    Number of Visits Requested:   1    Requested Prescriptions    No prescriptions requested or ordered in this encounter

## 2018-09-04 DIAGNOSIS — C672 Malignant neoplasm of lateral wall of bladder: Secondary | ICD-10-CM | POA: Diagnosis not present

## 2018-09-04 DIAGNOSIS — Z5111 Encounter for antineoplastic chemotherapy: Secondary | ICD-10-CM | POA: Diagnosis not present

## 2018-09-11 DIAGNOSIS — C672 Malignant neoplasm of lateral wall of bladder: Secondary | ICD-10-CM | POA: Diagnosis not present

## 2018-09-11 DIAGNOSIS — Z5111 Encounter for antineoplastic chemotherapy: Secondary | ICD-10-CM | POA: Diagnosis not present

## 2018-10-31 ENCOUNTER — Other Ambulatory Visit (INDEPENDENT_AMBULATORY_CARE_PROVIDER_SITE_OTHER): Payer: Medicare HMO

## 2018-10-31 ENCOUNTER — Ambulatory Visit (INDEPENDENT_AMBULATORY_CARE_PROVIDER_SITE_OTHER): Payer: Medicare HMO | Admitting: Internal Medicine

## 2018-10-31 ENCOUNTER — Encounter: Payer: Self-pay | Admitting: Internal Medicine

## 2018-10-31 VITALS — BP 124/58 | HR 77 | Temp 98.8°F | Ht >= 80 in | Wt 303.8 lb

## 2018-10-31 DIAGNOSIS — E1165 Type 2 diabetes mellitus with hyperglycemia: Secondary | ICD-10-CM | POA: Diagnosis not present

## 2018-10-31 DIAGNOSIS — IMO0002 Reserved for concepts with insufficient information to code with codable children: Secondary | ICD-10-CM

## 2018-10-31 DIAGNOSIS — E118 Type 2 diabetes mellitus with unspecified complications: Secondary | ICD-10-CM

## 2018-10-31 DIAGNOSIS — D539 Nutritional anemia, unspecified: Secondary | ICD-10-CM

## 2018-10-31 LAB — CBC WITH DIFFERENTIAL/PLATELET
Basophils Absolute: 0 10*3/uL (ref 0.0–0.1)
Basophils Relative: 0.7 % (ref 0.0–3.0)
Eosinophils Absolute: 0.4 10*3/uL (ref 0.0–0.7)
Eosinophils Relative: 8.6 % — ABNORMAL HIGH (ref 0.0–5.0)
HCT: 39.3 % (ref 39.0–52.0)
Hemoglobin: 13.4 g/dL (ref 13.0–17.0)
LYMPHS PCT: 22.2 % (ref 12.0–46.0)
Lymphs Abs: 1.1 10*3/uL (ref 0.7–4.0)
MCHC: 34 g/dL (ref 30.0–36.0)
MCV: 92.6 fl (ref 78.0–100.0)
Monocytes Absolute: 0.4 10*3/uL (ref 0.1–1.0)
Monocytes Relative: 7.2 % (ref 3.0–12.0)
NEUTROS ABS: 3.1 10*3/uL (ref 1.4–7.7)
NEUTROS PCT: 61.3 % (ref 43.0–77.0)
PLATELETS: 178 10*3/uL (ref 150.0–400.0)
RBC: 4.25 Mil/uL (ref 4.22–5.81)
RDW: 12.5 % (ref 11.5–15.5)
WBC: 5.1 10*3/uL (ref 4.0–10.5)

## 2018-10-31 LAB — HEMOGLOBIN A1C: Hgb A1c MFr Bld: 8.7 % — ABNORMAL HIGH (ref 4.6–6.5)

## 2018-10-31 LAB — BASIC METABOLIC PANEL
BUN: 18 mg/dL (ref 6–23)
CALCIUM: 9.7 mg/dL (ref 8.4–10.5)
CO2: 28 mEq/L (ref 19–32)
CREATININE: 1.23 mg/dL (ref 0.40–1.50)
Chloride: 102 mEq/L (ref 96–112)
GFR: 73.45 mL/min (ref >60.00)
GLUCOSE: 284 mg/dL — AB (ref 70–99)
Potassium: 4.6 mEq/L (ref 3.5–5.1)
Sodium: 138 mEq/L (ref 135–145)

## 2018-10-31 LAB — VITAMIN B12: Vitamin B-12: 360 pg/mL (ref 211–911)

## 2018-10-31 LAB — FERRITIN: Ferritin: 88.7 ng/mL (ref 22.0–322.0)

## 2018-10-31 LAB — IBC PANEL
Iron: 100 ug/dL (ref 42–165)
Saturation Ratios: 29.6 % (ref 20.0–50.0)
Transferrin: 241 mg/dL (ref 212.0–360.0)

## 2018-10-31 LAB — FOLATE: Folate: 19.8 ng/mL (ref >5.9)

## 2018-10-31 NOTE — Patient Instructions (Signed)
Anemia Anemia is a condition in which you do not have enough red blood cells or hemoglobin. Hemoglobin is a substance in red blood cells that carries oxygen. When you do not have enough red blood cells or hemoglobin (are anemic), your body cannot get enough oxygen and your organs may not work properly. As a result, you may feel very tired or have other problems. What are the causes? Common causes of anemia include:  Excessive bleeding. Anemia can be caused by excessive bleeding inside or outside the body, including bleeding from the intestine or from periods in women.  Poor nutrition.  Long-lasting (chronic) kidney, thyroid, and liver disease.  Bone marrow disorders.  Cancer and treatments for cancer.  HIV (human immunodeficiency virus) and AIDS (acquired immunodeficiency syndrome).  Treatments for HIV and AIDS.  Spleen problems.  Blood disorders.  Infections, medicines, and autoimmune disorders that destroy red blood cells.  What are the signs or symptoms? Symptoms of this condition include:  Minor weakness.  Dizziness.  Headache.  Feeling heartbeats that are irregular or faster than normal (palpitations).  Shortness of breath, especially with exercise.  Paleness.  Cold sensitivity.  Indigestion.  Nausea.  Difficulty sleeping.  Difficulty concentrating.  Symptoms may occur suddenly or develop slowly. If your anemia is mild, you may not have symptoms. How is this diagnosed? This condition is diagnosed based on:  Blood tests.  Your medical history.  A physical exam.  Bone marrow biopsy.  Your health care provider may also check your stool (feces) for blood and may do additional testing to look for the cause of your bleeding. You may also have other tests, including:  Imaging tests, such as a CT scan or MRI.  Endoscopy.  Colonoscopy.  How is this treated? Treatment for this condition depends on the cause. If you continue to lose a lot of blood,  you may need to be treated at a hospital. Treatment may include:  Taking supplements of iron, vitamin B12, or folic acid.  Taking a hormone medicine (erythropoietin) that can help to stimulate red blood cell growth.  Having a blood transfusion. This may be needed if you lose a lot of blood.  Making changes to your diet.  Having surgery to remove your spleen.  Follow these instructions at home:  Take over-the-counter and prescription medicines only as told by your health care provider.  Take supplements only as told by your health care provider.  Follow any diet instructions that you were given.  Keep all follow-up visits as told by your health care provider. This is important. Contact a health care provider if:  You develop new bleeding anywhere in the body. Get help right away if:  You are very weak.  You are short of breath.  You have pain in your abdomen or chest.  You are dizzy or feel faint.  You have trouble concentrating.  You have bloody or black, tarry stools.  You vomit repeatedly or you vomit up blood. Summary  Anemia is a condition in which you do not have enough red blood cells or enough of a substance in your red blood cells that carries oxygen (hemoglobin).  Symptoms may occur suddenly or develop slowly.  If your anemia is mild, you may not have symptoms.  This condition is diagnosed with blood tests as well as a medical history and physical exam. Other tests may be needed.  Treatment for this condition depends on the cause of the anemia. This information is not intended to replace advice   given to you by your health care provider. Make sure you discuss any questions you have with your health care provider. Document Released: 12/09/2004 Document Revised: 12/03/2016 Document Reviewed: 12/03/2016 Elsevier Interactive Patient Education  [employer].

## 2018-10-31 NOTE — Progress Notes (Signed)
Subjective:  Patient ID: Jason [name], male    DOB: 1941/09/09  Age: 77 y.o. MRN: [id number]  CC: Anemia and Diabetes   HPI AHMEER TUMAN presents for f/up -  He offers no complaints today.  He does not monitor his blood pressure or his blood sugar.  He is not certain if he has been compliant with his sulfonylurea and metformin recently.  He denies polys.  He had lab work done recently that showed he was anemic.  Outpatient Medications Prior to Visit  Medication Sig Dispense Refill  . albuterol (PROVENTIL) (2.5 MG/3ML) 0.083% nebulizer solution Take 3 mLs (2.5 mg total) by nebulization every 4 (four) hours as needed for wheezing.    Marland Kitchen aspirin EC 81 MG tablet Take 1 tablet (81 mg total) by mouth daily. 90 tablet 3  . blood glucose meter kit and supplies KIT Use to test blood sugar up to 3 times daily. DX E11.9 1 each 0  . Blood Glucose Monitoring Suppl (TRUE METRIX METER) w/Device KIT Use to check blood sugar DX E11.8 1 kit 0  . dexlansoprazole (DEXILANT) 60 MG capsule Take 1 capsule (60 mg total) by mouth daily. 90 capsule 1  . docusate sodium (COLACE) 100 MG capsule Take 1 capsule (100 mg total) by mouth 2 (two) times daily. 10 capsule 0  . famotidine (PEPCID) 20 MG tablet Take 1 tablet (20 mg total) by mouth 2 (two) times daily. 10 tablet 0  . glimepiride (AMARYL) 2 MG tablet TAKE 1 TABLET EVERY DAY WITH BREAKFAST 90 tablet 1  . glucose blood (TRUE METRIX BLOOD GLUCOSE TEST) test strip Use to test blood sugar twice daily. DX: E11.9 200 each 3  . linaclotide (LINZESS) 145 MCG CAPS capsule Take 1 capsule (145 mcg total) by mouth daily before breakfast. 90 capsule 1  . metFORMIN (GLUCOPHAGE) 500 MG tablet Take 2 tablets (1,000 mg total) by mouth 2 (two) times daily. 360 tablet 1  . phenazopyridine (PYRIDIUM) 200 MG tablet Take 1 tablet (200 mg total) by mouth 3 (three) times daily as needed for pain. 10 tablet 0  . polyethylene glycol (MIRALAX / GLYCOLAX) packet Take 17 g by mouth daily.  14 each 0  . simvastatin (ZOCOR) 40 MG tablet TAKE 1 TABLET EVERY EVENING 90 tablet 1  . traMADol (ULTRAM) 50 MG tablet Take 1-2 tablets (50-100 mg total) by mouth every 6 (six) hours as needed for moderate pain. 15 tablet 0  . TRUEPLUS LANCETS 33G MISC Use to test blood sugar twice daily. DX: E11.9 200 each 3  . vitamin C (ASCORBIC ACID) 500 MG tablet Take 500 mg by mouth daily.      No facility-administered medications prior to visit.     ROS Review of Systems  Constitutional: Negative for diaphoresis and fatigue.  HENT: Negative.  Negative for trouble swallowing.   Eyes: Negative for visual disturbance.  Respiratory: Negative for cough, chest tightness, shortness of breath and wheezing.   Cardiovascular: Negative for chest pain, palpitations and leg swelling.  Gastrointestinal: Negative for abdominal pain, constipation, diarrhea and nausea.  Endocrine: Negative.  Negative for polyphagia and polyuria.  Genitourinary: Negative.  Negative for difficulty urinating, dysuria and urgency.  Musculoskeletal: Negative.  Negative for arthralgias and myalgias.  Skin: Negative.  Negative for color change.  Neurological: Negative for dizziness, weakness and light-headedness.  Hematological: Negative for adenopathy. Does not bruise/bleed easily.  Psychiatric/Behavioral: Negative.     Objective:  BP (!) 124/58 (BP Location: Left Arm, Patient Position:  Sitting, Cuff Size: Large)   Pulse 77   Temp 98.8 F (37.1 C) (Oral)   Ht _0  (2.032 m)   Wt (!) 303 lb 12 oz (137.8 kg)   SpO2 96%   BMI 33.37 kg/m   BP Readings from Last 3 Encounters:  10/31/18 (!) 124/58  08/30/18 112/68  07/11/18 116/62    Wt Readings from Last 3 Encounters:  10/31/18 (!) 303 lb 12 oz (137.8 kg)  08/30/18 293 lb 0.6 oz (132.9 kg)  07/11/18 289 lb 1.9 oz (131.1 kg)    Physical Exam Vitals signs reviewed.  Constitutional:      Appearance: He is obese.  HENT:     Nose: Nose normal.     Mouth/Throat:      Mouth: Mucous membranes are moist.     Pharynx: No posterior oropharyngeal erythema.  Neck:     Musculoskeletal: Normal range of motion and neck supple.  Cardiovascular:     Rate and Rhythm: Normal rate and regular rhythm.     Pulses: Normal pulses.     Heart sounds: No murmur. No gallop.   Pulmonary:     Effort: Pulmonary effort is normal.     Breath sounds: Normal breath sounds. No stridor. No wheezing, rhonchi or rales.  Abdominal:     General: Abdomen is flat. Bowel sounds are normal.     Palpations: There is no hepatomegaly, splenomegaly or mass.     Tenderness: There is no abdominal tenderness.  Musculoskeletal: Normal range of motion.        General: No swelling, tenderness or signs of injury.  Skin:    General: Skin is warm and dry.     Coloration: Skin is not pale.  Neurological:     General: No focal deficit present.     Mental Status: He is alert and oriented to person, place, and time. Mental status is at baseline.     Lab Results  Component Value Date   WBC 5.1 10/31/2018   HGB 13.4 10/31/2018   HCT 39.3 10/31/2018   PLT 178.0 10/31/2018   GLUCOSE 284 (H) 10/31/2018   CHOL 121 02/07/2018   TRIG 66.0 02/07/2018   HDL 47.50 02/07/2018   LDLCALC 60 02/07/2018   ALT 66 (H) 06/21/2018   AST 22 06/21/2018   NA 138 10/31/2018   K 4.6 10/31/2018   CL 102 10/31/2018   CREATININE 1.23 10/31/2018   BUN 18 10/31/2018   CO2 28 10/31/2018   TSH 1.66 06/12/2018   PSA 0.63 03/21/2018   INR 1.05 10/17/2012   HGBA1C 8.7 (H) 10/31/2018   MICROALBUR 4.7 (H) 02/07/2018    No results found.  Assessment & Plan:   Jhett was seen today for anemia and diabetes.  Diagnoses and all orders for this visit:  Diabetes mellitus type 2, uncontrolled, with complications ([place])- His A1c is up to 8.7%.  His blood sugars are not well controlled.  It looks like he is not compliant with metformin or the sulfonylurea.  I have asked him to be compliant with both of these. -      Hemoglobin A1c; Future -     Basic metabolic panel; Future  Deficiency anemia- His H&H are normal now and his vitamin levels are all normal. -     Vitamin B12; Future -     CBC with Differential/Platelet; Future -     IBC panel; Future -     Folate; Future -     Ferritin; Future -  Vitamin B1; Future   I am having Erling Conte. Krzyzanowski maintain his aspirin EC, blood glucose meter kit and supplies, TRUE METRIX METER, docusate sodium, polyethylene glycol, vitamin C, albuterol, famotidine, linaclotide, dexlansoprazole, traMADol, phenazopyridine, metFORMIN, glucose blood, TRUEPLUS LANCETS 33G, simvastatin, and glimepiride.  No orders of the defined types were placed in this encounter.    Follow-up: Return in about 3 months (around 01/30/2019).  Scarlette Calico, MD

## 2018-11-04 LAB — VITAMIN B1: Vitamin B1 (Thiamine): 12 nmol/L (ref 8–30)

## 2018-11-22 ENCOUNTER — Other Ambulatory Visit: Payer: Self-pay | Admitting: Internal Medicine

## 2018-11-22 ENCOUNTER — Ambulatory Visit: Payer: Medicare HMO | Admitting: Podiatry

## 2018-11-22 DIAGNOSIS — I70209 Unspecified atherosclerosis of native arteries of extremities, unspecified extremity: Principal | ICD-10-CM

## 2018-11-22 DIAGNOSIS — E1151 Type 2 diabetes mellitus with diabetic peripheral angiopathy without gangrene: Secondary | ICD-10-CM

## 2018-11-23 ENCOUNTER — Telehealth: Payer: Self-pay | Admitting: Internal Medicine

## 2018-11-23 ENCOUNTER — Telehealth: Payer: Self-pay

## 2018-11-23 ENCOUNTER — Other Ambulatory Visit: Payer: Self-pay | Admitting: Internal Medicine

## 2018-11-23 DIAGNOSIS — E118 Type 2 diabetes mellitus with unspecified complications: Principal | ICD-10-CM

## 2018-11-23 DIAGNOSIS — IMO0002 Reserved for concepts with insufficient information to code with codable children: Secondary | ICD-10-CM

## 2018-11-23 DIAGNOSIS — E1151 Type 2 diabetes mellitus with diabetic peripheral angiopathy without gangrene: Secondary | ICD-10-CM

## 2018-11-23 DIAGNOSIS — E1165 Type 2 diabetes mellitus with hyperglycemia: Secondary | ICD-10-CM

## 2018-11-23 DIAGNOSIS — I70209 Unspecified atherosclerosis of native arteries of extremities, unspecified extremity: Secondary | ICD-10-CM

## 2018-11-23 MED ORDER — DAPAGLIFLOZIN PROPANEDIOL 5 MG PO TABS: 5.0000 mg | ORAL_TABLET | Freq: Every day | ORAL | 0 refills | Status: DC

## 2018-11-23 MED ORDER — GLIMEPIRIDE 2 MG PO TABS: 2.0000 mg | ORAL_TABLET | Freq: Every day | ORAL | 1 refills | Status: DC

## 2018-11-23 MED ORDER — SIMVASTATIN 40 MG PO TABS: 40.0000 mg | ORAL_TABLET | Freq: Every evening | ORAL | 1 refills | Status: DC

## 2018-11-23 MED ORDER — METFORMIN HCL 500 MG PO TABS: 1000.0000 mg | ORAL_TABLET | Freq: Two times a day (BID) | ORAL | 1 refills | Status: DC

## 2018-11-23 NOTE — Telephone Encounter (Signed)
Copied from [place] [id number]. Topic: General - Other >> Nov 23, 2018  1:56 PM Leward Quan A wrote: Reason for CRM: Patient wife called to say that they cannot afford the dapagliflozin propanediol (FARXIGA) 5 MG TABS tablet since it is $195 for a 3 month supply. Asking for something else that is covered by the insurance. And would like that sent to [facility] [place], [address] [phone] (Phone) [phone] (Fax)

## 2018-11-23 NOTE — Telephone Encounter (Signed)
Spoke to pt regarding adding a new medication (see lab note).   Pt requested refills of medication and informed me of a pharmacy change from [facility] to [facility]. This change has been completed.

## 2018-11-25 ENCOUNTER — Other Ambulatory Visit: Payer: Self-pay | Admitting: Internal Medicine

## 2018-11-25 DIAGNOSIS — E1165 Type 2 diabetes mellitus with hyperglycemia: Secondary | ICD-10-CM

## 2018-11-25 DIAGNOSIS — I70209 Unspecified atherosclerosis of native arteries of extremities, unspecified extremity: Principal | ICD-10-CM

## 2018-11-25 DIAGNOSIS — E1151 Type 2 diabetes mellitus with diabetic peripheral angiopathy without gangrene: Secondary | ICD-10-CM

## 2018-11-25 DIAGNOSIS — E118 Type 2 diabetes mellitus with unspecified complications: Secondary | ICD-10-CM

## 2018-11-25 DIAGNOSIS — IMO0002 Reserved for concepts with insufficient information to code with codable children: Secondary | ICD-10-CM

## 2018-11-25 MED ORDER — ERTUGLIFLOZIN L-PYROGLUTAMICAC 5 MG PO TABS: 1.0000 | ORAL_TABLET | Freq: Every day | ORAL | 1 refills | Status: DC

## 2018-11-27 NOTE — Telephone Encounter (Signed)
Ask his insurance company which SGLT-2 Inh they cover  [place]

## 2018-11-27 NOTE — Telephone Encounter (Signed)
Pt came into office and stated that he did not want to start on a new medication. Instead, he would like a referral to a nutritionist to help with changing his eating habits.  Referral has been entered.

## 2018-11-27 NOTE — Telephone Encounter (Signed)
Is there an alternative to the farxiga 5 mg?

## 2018-11-27 NOTE — Addendum Note (Signed)
Addended by: Aviva Signs M on: 11/27/2018 11:42 AM   Modules accepted: Orders

## 2018-11-28 DIAGNOSIS — R3915 Urgency of urination: Secondary | ICD-10-CM | POA: Diagnosis not present

## 2018-11-28 DIAGNOSIS — C672 Malignant neoplasm of lateral wall of bladder: Secondary | ICD-10-CM | POA: Diagnosis not present

## 2019-01-25 ENCOUNTER — Encounter: Payer: Medicare Other | Attending: Internal Medicine | Admitting: Registered"

## 2019-01-25 ENCOUNTER — Encounter: Payer: Self-pay | Admitting: Registered"

## 2019-01-25 ENCOUNTER — Other Ambulatory Visit: Payer: Self-pay

## 2019-01-25 DIAGNOSIS — E1151 Type 2 diabetes mellitus with diabetic peripheral angiopathy without gangrene: Secondary | ICD-10-CM | POA: Diagnosis not present

## 2019-01-25 DIAGNOSIS — I70209 Unspecified atherosclerosis of native arteries of extremities, unspecified extremity: Secondary | ICD-10-CM | POA: Diagnosis not present

## 2019-01-25 NOTE — Patient Instructions (Addendum)
Great plan to start doing more with your hobbies with pictures and making frames. Balanced option for breakfast: oatmeal with walnuts, grits and eggs. When eating fruit be sure to have protein with it. When you would like to have dessert, consider eating it closer to a meal and having fewer carbs in the meal. When checking your blood sugar in the morning continue using the target range of 80-130 If you interested in seeing how your doing after eating your target is less than 180. Within those numbers you would likely have an A1c of 7% or less. Consider keeping a diary of food eating around the time of having diarrhea and making an appointment with your doctor to discuss. Look into the list of dentists provided and consider going in for an exam.
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
## 2019-02-06 ENCOUNTER — Telehealth: Payer: Self-pay | Admitting: Internal Medicine

## 2019-02-06 DIAGNOSIS — E118 Type 2 diabetes mellitus with unspecified complications: Principal | ICD-10-CM

## 2019-02-06 DIAGNOSIS — E1165 Type 2 diabetes mellitus with hyperglycemia: Secondary | ICD-10-CM

## 2019-02-06 DIAGNOSIS — IMO0002 Reserved for concepts with insufficient information to code with codable children: Secondary | ICD-10-CM

## 2019-02-06 MED ORDER — TRUEPLUS LANCETS 33G MISC: 3 refills | Status: DC

## 2019-02-06 NOTE — Telephone Encounter (Signed)
Copied from [place] [id number]. Topic: Quick Communication - Rx Refill/Question >> Feb 06, 2019 10:03 AM Richardo Priest, NT wrote: Medication:  TRUEPLUS LANCETS 33G MISC  glucose blood (TRUE METRIX BLOOD GLUCOSE TEST) test strip  Has the patient contacted their pharmacy? Yes, patient no longer has any.   Preferred Pharmacy (with phone number or street name):  [facility] [phone] - [place], [address] AT [address] [facility] [address] [place] [phone] (Phone) [phone] (Fax)  Agent: Please be advised that RX refills may take up to 3 business days. We ask that you follow-up with your pharmacy.

## 2019-02-10 ENCOUNTER — Other Ambulatory Visit: Payer: Self-pay | Admitting: Internal Medicine

## 2019-02-10 MED ORDER — GLUCOSE BLOOD VI STRP: ORAL_STRIP | 3 refills | Status: DC

## 2019-02-13 ENCOUNTER — Other Ambulatory Visit: Payer: Self-pay

## 2019-02-13 ENCOUNTER — Telehealth: Payer: Self-pay | Admitting: Internal Medicine

## 2019-02-13 DIAGNOSIS — E1165 Type 2 diabetes mellitus with hyperglycemia: Secondary | ICD-10-CM

## 2019-02-13 DIAGNOSIS — E118 Type 2 diabetes mellitus with unspecified complications: Principal | ICD-10-CM

## 2019-02-13 DIAGNOSIS — IMO0002 Reserved for concepts with insufficient information to code with codable children: Secondary | ICD-10-CM

## 2019-02-13 MED ORDER — TRUEPLUS LANCETS 33G MISC: 3 refills | Status: DC

## 2019-02-13 MED ORDER — GLUCOSE BLOOD VI STRP: ORAL_STRIP | 3 refills | Status: DC

## 2019-02-13 NOTE — Telephone Encounter (Signed)
Patients wife called requesting that the Glucose blood True Metrix Blood test strips and the True plus Lancets be resubmitted to [facility] not [facility].  Thanks

## 2019-02-13 NOTE — Progress Notes (Signed)
Pt called and asked to resubmit refill for lancets and test strips to [facility]

## 2019-02-14 ENCOUNTER — Other Ambulatory Visit: Payer: Self-pay | Admitting: Internal Medicine

## 2019-02-14 DIAGNOSIS — E1151 Type 2 diabetes mellitus with diabetic peripheral angiopathy without gangrene: Secondary | ICD-10-CM

## 2019-02-14 DIAGNOSIS — I70209 Unspecified atherosclerosis of native arteries of extremities, unspecified extremity: Principal | ICD-10-CM

## 2019-02-14 MED ORDER — ONETOUCH VERIO W/DEVICE KIT: 1.0000 | PACK | Freq: Three times a day (TID) | 2 refills | Status: DC

## 2019-03-08 ENCOUNTER — Other Ambulatory Visit: Payer: Self-pay | Admitting: Internal Medicine

## 2019-03-08 DIAGNOSIS — IMO0002 Reserved for concepts with insufficient information to code with codable children: Secondary | ICD-10-CM

## 2019-03-08 DIAGNOSIS — I70209 Unspecified atherosclerosis of native arteries of extremities, unspecified extremity: Principal | ICD-10-CM

## 2019-03-08 DIAGNOSIS — E1165 Type 2 diabetes mellitus with hyperglycemia: Secondary | ICD-10-CM

## 2019-03-08 DIAGNOSIS — E1151 Type 2 diabetes mellitus with diabetic peripheral angiopathy without gangrene: Secondary | ICD-10-CM

## 2019-03-08 DIAGNOSIS — E118 Type 2 diabetes mellitus with unspecified complications: Secondary | ICD-10-CM

## 2019-03-08 MED ORDER — GLIMEPIRIDE 2 MG PO TABS: 2.0000 mg | ORAL_TABLET | Freq: Every day | ORAL | 0 refills | Status: DC

## 2019-03-08 MED ORDER — METFORMIN HCL 500 MG PO TABS: 1000.0000 mg | ORAL_TABLET | Freq: Two times a day (BID) | ORAL | 0 refills | Status: DC

## 2019-03-08 MED ORDER — SIMVASTATIN 40 MG PO TABS: 40.0000 mg | ORAL_TABLET | Freq: Every evening | ORAL | 1 refills | Status: DC

## 2019-03-08 NOTE — Telephone Encounter (Signed)
Approved 90 supply per protocol. OV due within 2 months. Requested Prescriptions  Pending Prescriptions Disp Refills  . metFORMIN (GLUCOPHAGE) 500 MG tablet 360 tablet 0    Sig: Take 2 tablets (1,000 mg total) by mouth 2 (two) times daily.     Endocrinology:  Diabetes - Biguanides Failed - 03/08/2019 11:03 AM      Failed - HBA1C is between 0 and 7.9 and within 180 days    Hgb A1c MFr Bld  Date Value Ref Range Status  10/31/2018 8.7 (H) 4.6 - 6.5 % Final    Comment:    Glycemic Control Guidelines for People with Diabetes:Non Diabetic:  <6%Goal of Therapy: <7%Additional Action Suggested:  >8%          Passed - Cr in normal range and within 360 days    Creatinine, Ser  Date Value Ref Range Status  10/31/2018 1.23 0.40 - 1.50 mg/dL Final         Passed - eGFR in normal range and within 360 days    GFR calc Af Amer  Date Value Ref Range Status  07/05/2018 >60 >60 mL/min Final    Comment:    (NOTE) The eGFR has been calculated using the CKD EPI equation. This calculation has not been validated in all clinical situations. eGFR's persistently <60 mL/min signify possible Chronic Kidney Disease.    GFR calc non Af Amer  Date Value Ref Range Status  07/05/2018 59 (L) >60 mL/min Final   GFR  Date Value Ref Range Status  10/31/2018 73.45 >60.00 mL/min Final         Passed - Valid encounter within last 6 months    Recent Outpatient Visits          4 months ago Diabetes mellitus type 2, uncontrolled, with complications ([place])   [place], MD   6 months ago Tinnitus of both [place], Marvis Repress, FNP   8 months ago Diabetes mellitus type 2, uncontrolled, with complications [facility])   [place], Marvis Repress, FNP   8 months ago Gastroesophageal reflux disease with esophagitis   [place], MD   8 months ago New daily  persistent headache   [place], MD      Future Appointments            In 1 month [place], [place]          . glimepiride (AMARYL) 2 MG tablet 90 tablet 0    Sig: Take 1 tablet (2 mg total) by mouth daily with breakfast.     Endocrinology:  Diabetes - Sulfonylureas Failed - 03/08/2019 11:03 AM      Failed - HBA1C is between 0 and 7.9 and within 180 days    Hgb A1c MFr Bld  Date Value Ref Range Status  10/31/2018 8.7 (H) 4.6 - 6.5 % Final    Comment:    Glycemic Control Guidelines for People with Diabetes:Non Diabetic:  <6%Goal of Therapy: <7%Additional Action Suggested:  >8%          Passed - Valid encounter within last 6 months    Recent Outpatient Visits          4 months ago Diabetes mellitus type 2, uncontrolled, with complications [facility])   [facility] [place], MD  6 months ago Tinnitus of both ears   [employer] [place], Marvis Repress, FNP   8 months ago Diabetes mellitus type 2, uncontrolled, with complications [facility])   [place], Marvis Repress, FNP   8 months ago Gastroesophageal reflux disease with esophagitis   [place], MD   8 months ago New daily persistent headache   [place], MD      Future Appointments            In 1 month [place], [place]          . simvastatin (ZOCOR) 40 MG tablet 90 tablet 1    Sig: Take 1 tablet (40 mg total) by mouth every evening.     Cardiovascular:  Antilipid - Statins Failed - 03/08/2019 11:03 AM      Failed - Total Cholesterol in normal range and within 360 days    Cholesterol  Date Value Ref Range Status  02/07/2018 121 0 - 200 mg/dL Final    Comment:    ATP III Classification       Desirable:  < 200 mg/dL               Borderline  High:  200 - 239 mg/dL          High:  > = 240 mg/dL         Failed - LDL in normal range and within 360 days    LDL Cholesterol  Date Value Ref Range Status  02/07/2018 60 0 - 99 mg/dL Final         Failed - HDL in normal range and within 360 days    HDL  Date Value Ref Range Status  02/07/2018 47.50 >39.00 mg/dL Final         Failed - Triglycerides in normal range and within 360 days    Triglycerides  Date Value Ref Range Status  02/07/2018 66.0 0.0 - 149.0 mg/dL Final    Comment:    Normal:  <150 mg/dLBorderline High:  150 - 199 mg/dL         Passed - Patient is not pregnant      Passed - Valid encounter within last 12 months    Recent Outpatient Visits          4 months ago Diabetes mellitus type 2, uncontrolled, with complications ([place])   [place], MD   6 months ago Tinnitus of both ears   [place], Marvis Repress, [place]   8 months ago Diabetes mellitus type 2, uncontrolled, with complications [facility])   [place], Marvis Repress, FNP   8 months ago Gastroesophageal reflux disease with esophagitis   [place], MD   8 months ago New daily persistent headache   [place], MD      Future Appointments            In 1 month [place], [facility]

## 2019-04-13 ENCOUNTER — Telehealth: Payer: Self-pay | Admitting: Internal Medicine

## 2019-04-13 NOTE — Telephone Encounter (Signed)
Patient is scheduled for in office appt on 6/3.  He states himself and his wife were tested for COVID 28 today at [facility].   Patient states he has not been around anyone diagnosed with COVID and he denied having all symptoms of COVID with the exception of cough.   Please follow up in regard on patient being able to keep in office appointment.

## 2019-04-17 NOTE — Telephone Encounter (Signed)
Spoke with patient today.  He was informed that his COVID testing came back negative.

## 2019-04-17 NOTE — Progress Notes (Addendum)
Subjective:   Jason [name] is a 78 y.o. male who presents for Medicare Annual/Subsequent preventive examination.  Review of Systems:  No ROS.  Medicare Wellness Virtual Visit.  Visual/audio telehealth visit, UTA vital signs.   See social history for additional risk factors.  Cardiac Risk Factors include: advanced age (>36mn, >>58women);diabetes mellitus;dyslipidemia;male gender;hypertension Sleep patterns: has interrupted sleep, gets up 2-3 times nightly to void and sleeps 5-6 hours nightly.    Home Safety/Smoke Alarms: Feels safe in home. Smoke alarms in place.  Living environment; residence and Firearm Safety: 1-story house/ trailer. Lives with wife, no needs for DME, good support system Seat Belt Safety/Bike Helmet: Wears seat belt.   PSA-  Lab Results  Component Value Date   PSA 0.63 03/21/2018   PSA 0.35 05/17/2017   PSA 0.44 09/24/2015       Objective:    Vitals: BP (!) 152/70   Pulse 76   Temp 98.5 F (36.9 C)   Resp 17   Ht '6\' 8"'  (2.032 m)   Wt (!) 309 lb (140.2 kg)   SpO2 99%   BMI 33.95 kg/m   Body mass index is 33.95 kg/m.  Advanced Directives 04/18/2019 01/25/2019 07/07/2018 07/05/2018 06/30/2018 06/14/2018 06/13/2018  Does Patient Have a Medical Advance Directive? No No No No No No No  Type of Advance Directive - - - - - - -  Does patient want to make changes to medical advance directive? - - - - - - -  Copy of [place]? - - - - - - -  Would patient like information on creating a medical advance directive? Yes (ED - Information included in AVS) No - Patient declined No - Patient declined No - Patient declined - No - Patient declined -  Pre-existing out of facility DNR order (yellow form or pink MOST form) - - - - - - -    Tobacco Social History   Tobacco Use  Smoking Status Current Some Day Smoker  . Packs/day: 0.10  . Years: 5.00  . Pack years: 0.50  . Types: Cigarettes  . Last attempt to quit: 09/05/2015  . Years  since quitting: 3.6  Smokeless Tobacco Never Used  Tobacco Comment   smokes a cigarette per day     Ready to quit: Not Answered Counseling given: Not Answered Comment: smokes a cigarette per day  Past Medical History:  Diagnosis Date  . Anxiety   . Asthma    in past, no inhaler now  . Benign neoplasm of colon   . Depression   . Diverticulosis of colon (without mention of hemorrhage)   . DJD (degenerative joint disease)   . Esophageal reflux   . Glaucoma   . Hidradenitis   . History of BPH   . History of gynecomastia    Unilateral  . Hyperlipidemia   . Irritable bowel syndrome   . Memory loss   . MVP (mitral valve prolapse)   . Obesity, unspecified   . Sleep apnea    does not use  cpap or bipap .. no study  ever  . Type II or unspecified type diabetes mellitus without mention of complication, not stated as uncontrolled   . Unspecified venous (peripheral) insufficiency    Past Surgical History:  Procedure Laterality Date  . AMPUTATION  08/09/2012   Procedure: AMPUTATION DIGIT;  Surgeon: MNewt Minion MD;  [facility]  Service: Orthopedics;  Laterality: Right;  Amputation right [place]  Toe MTP Joint  . AMPUTATION  10/17/2012   Procedure: AMPUTATION DIGIT;  Surgeon: Newt Minion, MD;  Location: [facility];  Service: Orthopedics;  Laterality: Right;  Right Foot 3rd Toe Amputation at Metatarsophalangeal Joint  . CHOLECYSTECTOMY N/A 06/14/2018   Procedure: LAPAROSCOPIC CHOLECYSTECTOMY WITH INTRAOPERATIVE CHOLANGIOGRAM;  Surgeon: Kieth Brightly Arta Bruce, MD;  Location: [facility];  Service: General;  Laterality: N/A;  . CIRCUMCISION  06/2100   For Phimosis - Dr Hartley Barefoot  . COLON SURGERY    . CYSTOSCOPY W/ RETROGRADES Bilateral 07/07/2018   Procedure: CYSTOSCOPY WITH BILATERAL RETROGRADE PYELOGRAM;  Surgeon: Ardis Hughs, MD;  Location: [facility];  Service: Urology;  Laterality: Bilateral;  . KNEE ARTHROSCOPY    . LAPAROSCOPIC LYSIS OF ADHESIONS  06/14/2018   Procedure: LAPAROSCOPIC  LYSIS OF ADHESIONS;  Surgeon: Kieth Brightly Arta Bruce, MD;  Location: [facility];  Service: General;;  . LAPAROTOMY  07/20/11  . LASER ABLATION  06/2009   Left Greater Saphenous vein -Dr Donnetta Hutching  . TOE AMPUTATION  06/2009   x3Right foot second toe -Dr Beola Cord  . TONSILLECTOMY    . TRANSURETHRAL RESECTION OF BLADDER TUMOR N/A 07/07/2018   Procedure: TRANSURETHRAL RESECTION OF BLADDER TUMOR (TURBT) WITH POST OP INSTILLATION OF GEMCITABIN;  Surgeon: Ardis Hughs, MD;  Location: [facility];  Service: Urology;  Laterality: N/A;   Family History  Problem Relation Age of Onset  . Diabetes Father   . Cancer Father        Prostate  . Stroke Father   . Prostate cancer Father   . Colon cancer Neg Hx   . Esophageal cancer Neg Hx   . Rectal cancer Neg Hx   . Stomach cancer Neg Hx    Social History   Socioeconomic History  . Marital status: Married    Spouse name: Peter Congo  . Number of children: 2  . Years of education: Not on file  . Highest education level: Not on file  Occupational History  . Occupation: retired  Scientific laboratory technician  . Financial resource strain: Hard  . Food insecurity:    Worry: Never true    Inability: Never true  . Transportation needs:    Medical: No    Non-medical: No  Tobacco Use  . Smoking status: Current Some Day Smoker    Packs/day: 0.10    Years: 5.00    Pack years: 0.50    Types: Cigarettes    Last attempt to quit: 09/05/2015    Years since quitting: 3.6  . Smokeless tobacco: Never Used  . Tobacco comment: smokes a cigarette per day  Substance and Sexual Activity  . Alcohol use: Yes    Alcohol/week: 7.0 standard drinks    Types: 7 Cans of beer per week    Comment: not every week   . Drug use: No  . Sexual activity: Not Currently  Lifestyle  . Physical activity:    Days per week: 3 days    Minutes per session: 30 min  . Stress: Rather much  Relationships  . Social connections:    Talks on phone: More than three times a week    Gets together: More than  three times a week    Attends religious service: More than 4 times per year    Active member of club or organization: Yes    Attends meetings of clubs or organizations: More than 4 times per year    Relationship status: Married  Other Topics Concern  . Not on file  Social History Narrative  . Not on file    Outpatient Encounter Medications as of 04/18/2019  Medication Sig  . aspirin EC 81 MG tablet Take 1 tablet (81 mg total) by mouth daily.  . Blood Glucose Monitoring Suppl (ONETOUCH VERIO) w/Device KIT 1 Act by Does not apply route 3 (three) times daily.  Marland Kitchen docusate sodium (COLACE) 100 MG capsule Take 1 capsule (100 mg total) by mouth 2 (two) times daily.  Marland Kitchen Ertugliflozin L-PyroglutamicAc (STEGLATRO) 5 MG TABS Take 1 tablet by mouth daily.  . famotidine (PEPCID) 20 MG tablet Take 1 tablet (20 mg total) by mouth 2 (two) times daily.  Marland Kitchen glimepiride (AMARYL) 2 MG tablet Take 1 tablet (2 mg total) by mouth daily with breakfast.  . glucose blood (TRUE METRIX BLOOD GLUCOSE TEST) test strip Use to test blood sugar twice daily. DX: E11.9  . metFORMIN (GLUCOPHAGE) 500 MG tablet Take 2 tablets (1,000 mg total) by mouth 2 (two) times daily.  . simvastatin (ZOCOR) 40 MG tablet Take 1 tablet (40 mg total) by mouth every evening.  . TRUEplus Lancets 33G MISC Use to test blood sugar twice daily. DX: E11.9  . [DISCONTINUED] phenazopyridine (PYRIDIUM) 200 MG tablet Take 1 tablet (200 mg total) by mouth 3 (three) times daily as needed for pain.  . [DISCONTINUED] albuterol (PROVENTIL) (2.5 MG/3ML) 0.083% nebulizer solution Take 3 mLs (2.5 mg total) by nebulization every 4 (four) hours as needed for wheezing.  . [DISCONTINUED] dexlansoprazole (DEXILANT) 60 MG capsule Take 1 capsule (60 mg total) by mouth daily. (Patient not taking: Reported on 04/18/2019)  . [DISCONTINUED] linaclotide (LINZESS) 145 MCG CAPS capsule Take 1 capsule (145 mcg total) by mouth daily before breakfast. (Patient not taking: Reported on  04/18/2019)  . [DISCONTINUED] polyethylene glycol (MIRALAX / GLYCOLAX) packet Take 17 g by mouth daily. (Patient not taking: Reported on 04/18/2019)   No facility-administered encounter medications on file as of 04/18/2019.     Activities of Daily Living In your present state of health, do you have any difficulty performing the following activities: 04/18/2019 07/05/2018  Hearing? N N  Vision? N N  Difficulty concentrating or making decisions? N N  Walking or climbing stairs? N N  Dressing or bathing? N N  Doing errands, shopping? N N  Preparing Food and eating ? N -  Using the Toilet? N -  In the past six months, have you accidently leaked urine? N -  Do you have problems with loss of bowel control? N -  Managing your Medications? N -  Managing your Finances? N -  Housekeeping or managing your Housekeeping? N -  Some recent data might be hidden    Patient Care Team: [name], MD as PCP - General (Internal Medicine)   Assessment:   This is a routine wellness examination for [place].Physical assessment deferred to PCP.  Exercise Activities and Dietary recommendations Current Exercise Habits: Home exercise routine, Type of exercise: walking, Time (Minutes): 30, Frequency (Times/Week): 3, Weekly Exercise (Minutes/Week): 90, Intensity: Mild, Exercise limited by: orthopedic condition(s)  Diet (meal preparation, eat out, water intake, caffeinated beverages, dairy products, fruits and vegetables): in general, a "healthy" diet     Reviewed heart healthy and diabetic diet. Encouraged patient to increase daily water and healthy fluid intake. Diet education was provided via handout.  Goals    . Patient Stated     Maintain current health status, continue to worship God, enjoy family and life. Spend time with my grandchildren and travel  to [place] to visit family and friends.    . 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        Fall Risk Fall Risk  04/18/2019 01/25/2019 04/13/2018 04/07/2017 01/15/2016  Falls in the past year? 1 1 Yes No No  Number falls in past yr: 0 0 1 - -  Injury with Fall? 1 1 No - -  Risk for fall due to : Impaired balance/gait;Impaired mobility Impaired balance/gait;Other (Comment) - - -  Risk for fall due to: Comment - states his center of gravity is off - - -  Follow up Falls prevention discussed - Falls prevention discussed - -    Depression Screen PHQ 2/9 Scores 04/18/2019 01/25/2019 04/13/2018 02/07/2018  PHQ - 2 Score '2 2 2 2  ' PHQ- 9 Score 5 - 5 10  Exception Documentation - - - -    Cognitive Function MMSE - Mini Mental State Exam 04/13/2018  Orientation to time 5  Orientation to Place 5  Registration 3  Attention/ Calculation 4  Recall 1  Language- name 2 objects 2  Language- repeat 1  Language- follow 3 step command 3  Language- read & follow direction 1  Write a sentence 1  Copy design 1  Total score 27        Immunization History  Administered Date(s) Administered  . Influenza Split 08/30/2011, 08/22/2012, 09/10/2015  . Influenza Whole 10/24/2007, 08/26/2009  . Influenza, High Dose Seasonal PF 07/12/2017  . Influenza,inj,Quad PF,6+ Mos 08/20/2013, 08/09/2014, 08/23/2016  . Influenza-Unspecified 09/12/2015, 10/18/2018  . Pneumococcal Conjugate-13 02/18/2014  . Pneumococcal Polysaccharide-23 08/06/2008  . Td 02/03/2009  . Zoster 08/09/2014   Screening Tests Health Maintenance  Topic Date Due  . COLONOSCOPY  06/09/2018  . TETANUS/TDAP  02/04/2019  . FOOT EXAM  02/08/2019  . URINE MICROALBUMIN  02/08/2019  . OPHTHALMOLOGY EXAM  03/03/2019  . HEMOGLOBIN A1C  05/02/2019  . INFLUENZA VACCINE  06/16/2019  . PNA vac Low Risk Adult  Completed       Plan:    Reviewed health maintenance screenings with patient today and relevant education, vaccines, and/or referrals were  provided.   Continue doing brain stimulating activities (puzzles, reading, adult coloring books, staying active) to keep memory sharp.   Continue to eat heart healthy diet (full of fruits, vegetables, whole grains, lean protein, water--limit salt, fat, and sugar intake) and increase physical activity as tolerated.   I have personally reviewed and noted the following in the patient's chart:   . Medical and social history . Use of alcohol, tobacco or illicit drugs  . Current medications and supplements . Functional ability and status . Nutritional status . Physical activity . Advanced directives . List of other physicians . Hospitalizations, surgeries, and ER visits in previous 12 months . Vitals . Screenings to include cognitive, depression, and falls . Referrals and appointments  In addition, I have reviewed and discussed with patient certain preventive protocols, quality metrics, and best practice recommendations. A written personalized care plan for preventive services as well as general preventive health recommendations were provided to patient.     Michiel Cowboy, RN  04/18/2019  Medical screening examination/treatment/procedure(s) were performed by non-physician practitioner and as supervising physician I was immediately available for consultation/collaboration. I agree with  above. Scarlette Calico, MD

## 2019-04-18 ENCOUNTER — Encounter: Payer: Self-pay | Admitting: Internal Medicine

## 2019-04-18 ENCOUNTER — Other Ambulatory Visit (INDEPENDENT_AMBULATORY_CARE_PROVIDER_SITE_OTHER): Payer: HMO

## 2019-04-18 ENCOUNTER — Ambulatory Visit (INDEPENDENT_AMBULATORY_CARE_PROVIDER_SITE_OTHER)
Admission: RE | Admit: 2019-04-18 | Discharge: 2019-04-18 | Disposition: A | Discharge status: Home / Self Care | Payer: HMO | Source: Ambulatory Visit | Attending: Internal Medicine | Admitting: Internal Medicine

## 2019-04-18 ENCOUNTER — Ambulatory Visit (INDEPENDENT_AMBULATORY_CARE_PROVIDER_SITE_OTHER): Payer: HMO | Admitting: *Deleted

## 2019-04-18 ENCOUNTER — Other Ambulatory Visit: Payer: Self-pay

## 2019-04-18 ENCOUNTER — Ambulatory Visit (INDEPENDENT_AMBULATORY_CARE_PROVIDER_SITE_OTHER): Payer: HMO | Admitting: Internal Medicine

## 2019-04-18 VITALS — BP 136/60 | HR 79 | Temp 98.5°F | Resp 16 | Ht >= 80 in | Wt 309.0 lb

## 2019-04-18 VITALS — BP 152/70 | HR 76 | Temp 98.5°F | Resp 17 | Ht >= 80 in | Wt 309.0 lb

## 2019-04-18 DIAGNOSIS — M25512 Pain in left shoulder: Secondary | ICD-10-CM | POA: Insufficient documentation

## 2019-04-18 DIAGNOSIS — E1151 Type 2 diabetes mellitus with diabetic peripheral angiopathy without gangrene: Secondary | ICD-10-CM

## 2019-04-18 DIAGNOSIS — IMO0002 Reserved for concepts with insufficient information to code with codable children: Secondary | ICD-10-CM

## 2019-04-18 DIAGNOSIS — E118 Type 2 diabetes mellitus with unspecified complications: Secondary | ICD-10-CM

## 2019-04-18 DIAGNOSIS — I70209 Unspecified atherosclerosis of native arteries of extremities, unspecified extremity: Secondary | ICD-10-CM

## 2019-04-18 DIAGNOSIS — E11621 Type 2 diabetes mellitus with foot ulcer: Secondary | ICD-10-CM

## 2019-04-18 DIAGNOSIS — E785 Hyperlipidemia, unspecified: Secondary | ICD-10-CM | POA: Diagnosis not present

## 2019-04-18 DIAGNOSIS — E1165 Type 2 diabetes mellitus with hyperglycemia: Secondary | ICD-10-CM

## 2019-04-18 DIAGNOSIS — K2100 Gastro-esophageal reflux disease with esophagitis, without bleeding: Secondary | ICD-10-CM

## 2019-04-18 DIAGNOSIS — Z1211 Encounter for screening for malignant neoplasm of colon: Secondary | ICD-10-CM

## 2019-04-18 DIAGNOSIS — N400 Enlarged prostate without lower urinary tract symptoms: Secondary | ICD-10-CM

## 2019-04-18 DIAGNOSIS — Z0000 Encounter for general adult medical examination without abnormal findings: Secondary | ICD-10-CM | POA: Diagnosis not present

## 2019-04-18 DIAGNOSIS — S91301A Unspecified open wound, right foot, initial encounter: Secondary | ICD-10-CM | POA: Diagnosis not present

## 2019-04-18 DIAGNOSIS — L97512 Non-pressure chronic ulcer of other part of right foot with fat layer exposed: Secondary | ICD-10-CM

## 2019-04-18 DIAGNOSIS — Z0001 Encounter for general adult medical examination with abnormal findings: Secondary | ICD-10-CM

## 2019-04-18 DIAGNOSIS — S4992XA Unspecified injury of left shoulder and upper arm, initial encounter: Secondary | ICD-10-CM | POA: Diagnosis not present

## 2019-04-18 LAB — CBC WITH DIFFERENTIAL/PLATELET
Basophils Absolute: 0 10*3/uL (ref 0.0–0.1)
Basophils Relative: 0.6 % (ref 0.0–3.0)
Eosinophils Absolute: 0.4 10*3/uL (ref 0.0–0.7)
Eosinophils Relative: 5.6 % — ABNORMAL HIGH (ref 0.0–5.0)
HCT: 39.2 % (ref 39.0–52.0)
Hemoglobin: 13.3 g/dL (ref 13.0–17.0)
Lymphocytes Relative: 20.3 % (ref 12.0–46.0)
Lymphs Abs: 1.3 10*3/uL (ref 0.7–4.0)
MCHC: 33.9 g/dL (ref 30.0–36.0)
MCV: 93.7 fl (ref 78.0–100.0)
Monocytes Absolute: 0.6 10*3/uL (ref 0.1–1.0)
Monocytes Relative: 9.8 % (ref 3.0–12.0)
Neutro Abs: 4 10*3/uL (ref 1.4–7.7)
Neutrophils Relative %: 63.7 % (ref 43.0–77.0)
Platelets: 178 10*3/uL (ref 150.0–400.0)
RBC: 4.18 Mil/uL — ABNORMAL LOW (ref 4.22–5.81)
RDW: 12.3 % (ref 11.5–15.5)
WBC: 6.3 10*3/uL (ref 4.0–10.5)

## 2019-04-18 LAB — URINALYSIS, ROUTINE W REFLEX MICROSCOPIC
Bilirubin Urine: NEGATIVE
Hgb urine dipstick: NEGATIVE
Ketones, ur: NEGATIVE
Leukocytes,Ua: NEGATIVE
Nitrite: NEGATIVE
RBC / HPF: NONE SEEN (ref 0–?)
Specific Gravity, Urine: >=1.03 — AB (ref 1.000–1.030)
Total Protein, Urine: 30 — AB
Urine Glucose: NEGATIVE
Urobilinogen, UA: 0.2 (ref 0.0–1.0)
pH: 5.5 (ref 5.0–8.0)

## 2019-04-18 LAB — COMPREHENSIVE METABOLIC PANEL
ALT: 13 U/L (ref 0–53)
AST: 12 U/L (ref 0–37)
Albumin: 4.1 g/dL (ref 3.5–5.2)
Alkaline Phosphatase: 43 U/L (ref 39–117)
BUN: 8 mg/dL (ref 6–23)
CO2: 29 mEq/L (ref 19–32)
Calcium: 9.2 mg/dL (ref 8.4–10.5)
Chloride: 102 mEq/L (ref 96–112)
Creatinine, Ser: 0.95 mg/dL (ref 0.40–1.50)
GFR: 93 mL/min (ref >60.00)
Glucose, Bld: 167 mg/dL — ABNORMAL HIGH (ref 70–99)
Potassium: 4.1 mEq/L (ref 3.5–5.1)
Sodium: 138 mEq/L (ref 135–145)
Total Bilirubin: 0.9 mg/dL (ref 0.2–1.2)
Total Protein: 7.1 g/dL (ref 6.0–8.3)

## 2019-04-18 LAB — MICROALBUMIN / CREATININE URINE RATIO
Creatinine,U: 196.1 mg/dL
Microalb Creat Ratio: 5.8 mg/g (ref 0.0–30.0)
Microalb, Ur: 11.3 mg/dL — ABNORMAL HIGH (ref 0.0–1.9)

## 2019-04-18 LAB — LIPID PANEL
Cholesterol: 137 mg/dL (ref 0–200)
HDL: 52.4 mg/dL (ref >39.00)
LDL Cholesterol: 68 mg/dL (ref 0–99)
NonHDL: 84.24
Total CHOL/HDL Ratio: 3
Triglycerides: 80 mg/dL (ref 0.0–149.0)
VLDL: 16 mg/dL (ref 0.0–40.0)

## 2019-04-18 LAB — TSH: TSH: 0.85 u[IU]/mL (ref 0.35–4.50)

## 2019-04-18 LAB — PSA: PSA: 1.13 ng/mL (ref 0.10–4.00)

## 2019-04-18 LAB — SARS CORONAVIRUS 2: SARS CoV2 AB IGG: NEGATIVE

## 2019-04-18 LAB — HEMOGLOBIN A1C: Hgb A1c MFr Bld: 9 % — ABNORMAL HIGH (ref 4.6–6.5)

## 2019-04-18 NOTE — Patient Instructions (Addendum)
Continue doing brain stimulating activities (puzzles, reading, adult coloring books, staying active) to keep memory sharp.   Continue to eat heart healthy diet (full of fruits, vegetables, whole grains, lean protein, water--limit salt, fat, and sugar intake) and increase physical activity as tolerated.   Jason [name] , Thank you for taking time to come for your Medicare Wellness Visit. I appreciate your ongoing commitment to your health goals. Please review the following plan we discussed and let me know if I can assist you in the future.   These are the goals we discussed: Goals    . Patient Stated     Maintain current health status, continue to worship God, enjoy family and life. Spend time with my grandchildren and travel to [place] to visit family and friends.    . 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        This is a list of the screening recommended for you and due dates:  Health Maintenance  Topic Date Due  . Colon Cancer Screening  06/09/2018  . Tetanus Vaccine  02/04/2019  . Complete foot exam   02/08/2019  . Urine Protein Check  02/08/2019  . Eye exam for diabetics  03/03/2019  . Hemoglobin A1C  05/02/2019  . Flu Shot  06/16/2019  . Pneumonia vaccines  Completed    The [place] is available Monday through Friday, 9:00 a.m. - 5:00 p.m. Call Center Specialists provide information and referral services to aging adults 65+ in [place]. If there are waiting lists for community services, or if services are not available, [facility] connects clients with [facility] volunteers, or other caring individuals in the community who provide services such as respite care, wheelchair ramp construction, friendly visits, or transportation assistance.  Call Center Specialists consider it an honor and privilege to pray with callers.  Contact the Call Center at [phone] or email [email or web address].  Housing, home repair, transportation, food, resources for caregivers, senior and disability centers, adult day care, long term care options. [email or web address].[email or web address] Can't find the help you need here? Dial 2-1-1 Or [phone], TTY # [phone] CONTACT  If you are looking for programs or services in your community, please dial 2-1-1 or use our online Search Tool.   Preventive Care 20 Years and Older, Male Preventive care refers to lifestyle choices and visits with your health care provider that can promote health and wellness. What does preventive care include?   A yearly physical exam. This is also called an annual well check.  Dental exams once or twice a year.  Routine eye exams. Ask your health care provider how often you should have your eyes checked.  Personal lifestyle choices, including: ? Daily care of your teeth and gums. ? Regular physical activity. ? Eating a healthy diet. ? Avoiding tobacco and drug use. ? Limiting alcohol use. ? Practicing safe sex. ? Taking low doses of aspirin every day. ? Taking vitamin and mineral supplements as recommended by your health care provider. What happens during an annual well check? The services and screenings done by your health care provider during your annual well check will depend on your age, overall health, lifestyle risk factors, and family history of disease. Counseling Your health care provider may ask you questions about your:  Alcohol use.  Tobacco use.  Drug use.  Emotional well-being.  Home and relationship well-being.  Sexual activity.  Eating habits.  History of falls.  Memory and ability to understand (cognition).  Work and work Statistician. Screening You may have the following tests or measurements:  Height, weight, and BMI.  Blood pressure.  Lipid and cholesterol levels. These may be checked every 5 years, or more  frequently if you are over 90 years old.  Skin check.  Lung cancer screening. You may have this screening every year starting at age 53 if you have a 30-pack-year history of smoking and currently smoke or have quit within the past 15 years.  Colorectal cancer screening. All adults should have this screening starting at age 68 and continuing until age 8. You will have tests every 1-10 years, depending on your results and the type of screening test. People at increased risk should start screening at an earlier age. Screening tests may include: ? Guaiac-based fecal occult blood testing. ? Fecal immunochemical test (FIT). ? Stool DNA test. ? Virtual colonoscopy. ? Sigmoidoscopy. During this test, a flexible tube with a tiny camera (sigmoidoscope) is used to examine your rectum and lower colon. The sigmoidoscope is inserted through your anus into your rectum and lower colon. ? Colonoscopy. During this test, a long, thin, flexible tube with a tiny camera (colonoscope) is used to examine your entire colon and rectum.  Prostate cancer screening. Recommendations will vary depending on your family history and other risks.  Hepatitis C blood test.  Hepatitis B blood test.  Sexually transmitted disease (STD) testing.  Diabetes screening. This is done by checking your blood sugar (glucose) after you have not eaten for a while (fasting). You may have this done every 1-3 years.  Abdominal aortic aneurysm (AAA) screening. You may need this if you are a current or former smoker.  Osteoporosis. You may be screened starting at age 85 if you are at high risk. Talk with your health care provider about your test results, treatment options, and if necessary, the need for more tests. Vaccines Your health care provider may recommend certain vaccines, such as:  Influenza vaccine. This is recommended every year.  Tetanus, diphtheria, and acellular pertussis (Tdap, Td) vaccine. You may need a Td booster every  10 years.  Varicella vaccine. You may need this if you have not been vaccinated.  Zoster vaccine. You may need this after age 66.  Measles, mumps, and rubella (MMR) vaccine. You may need at least one dose of MMR if you were born in 1957 or later. You may also need a second dose.  Pneumococcal 13-valent conjugate (PCV13) vaccine. One dose is recommended after age 45.  Pneumococcal polysaccharide (PPSV23) vaccine. One dose is recommended after age 81.  Meningococcal vaccine. You may need this if you have certain conditions.  Hepatitis A vaccine. You may need this if you have certain conditions or if you travel or work in places where you may be exposed to hepatitis A.  Hepatitis B vaccine. You may need this if you have certain conditions or if you travel or work in places where you may be exposed to hepatitis B.  Haemophilus influenzae type b (Hib) vaccine. You may need this if you have certain risk factors. Talk to your health care provider about which screenings and vaccines you need and how often you need them. This information is not intended to replace advice given to you by your health care provider. Make sure you discuss any questions you  have with your health care provider. Document Released: 11/28/2015 Document Revised: 12/22/2017 Document Reviewed: 09/02/2015 Elsevier Interactive Patient Education  2019 [employer].

## 2019-04-18 NOTE — Telephone Encounter (Signed)
I have abstracted the result.

## 2019-04-18 NOTE — Progress Notes (Signed)
Subjective:  Patient ID: Jason [name], male    DOB: November 23, 1940  Age: 78 y.o. MRN: [id number]  CC: Hyperlipidemia; Diabetes; and Annual Exam   HPI Jason [name] presents for f/up - He was putting his shoe on about a week ago and somehow injured the plantar side of his right fifth toe.  He now has a bleeding ulcer over the tip of the toe.  He does not have any sensation in his feet.  He also complains that about 6 weeks ago he was sitting in a chair, he lost his balance and fell out of the chair and landed on his left shoulder. Since then he has had L shoulder pain and a decrease in the range of motion.  He has not taken anything to control the symptoms.  Outpatient Medications Prior to Visit  Medication Sig Dispense Refill  . aspirin EC 81 MG tablet Take 1 tablet (81 mg total) by mouth daily. 90 tablet 3  . Blood Glucose Monitoring Suppl (ONETOUCH VERIO) w/Device KIT 1 Act by Does not apply route 3 (three) times daily. 1 kit 2  . docusate sodium (COLACE) 100 MG capsule Take 1 capsule (100 mg total) by mouth 2 (two) times daily. 10 capsule 0  . Ertugliflozin L-PyroglutamicAc (STEGLATRO) 5 MG TABS Take 1 tablet by mouth daily. 90 tablet 1  . famotidine (PEPCID) 20 MG tablet Take 1 tablet (20 mg total) by mouth 2 (two) times daily. 10 tablet 0  . glimepiride (AMARYL) 2 MG tablet Take 1 tablet (2 mg total) by mouth daily with breakfast. 90 tablet 0  . glucose blood (TRUE METRIX BLOOD GLUCOSE TEST) test strip Use to test blood sugar twice daily. DX: E11.9 200 each 3  . metFORMIN (GLUCOPHAGE) 500 MG tablet Take 2 tablets (1,000 mg total) by mouth 2 (two) times daily. 360 tablet 0  . simvastatin (ZOCOR) 40 MG tablet Take 1 tablet (40 mg total) by mouth every evening. 90 tablet 1  . TRUEplus Lancets 33G MISC Use to test blood sugar twice daily. DX: E11.9 200 each 3  . phenazopyridine (PYRIDIUM) 200 MG tablet Take 1 tablet (200 mg total) by mouth 3 (three) times daily as needed for pain. 10 tablet  0   No facility-administered medications prior to visit.     ROS Review of Systems  Constitutional: Negative for chills, fatigue and fever.  HENT: Negative.   Eyes: Negative for visual disturbance.  Respiratory: Negative for cough, chest tightness, shortness of breath and wheezing.   Cardiovascular: Negative for chest pain, palpitations and leg swelling.  Gastrointestinal: Negative for abdominal pain, diarrhea, nausea and vomiting.  Endocrine: Negative for polydipsia, polyphagia and polyuria.  Genitourinary: Negative.  Negative for difficulty urinating and dysuria.  Musculoskeletal: Positive for arthralgias. Negative for back pain and neck pain.  Skin: Positive for wound.  Neurological: Positive for numbness. Negative for dizziness, weakness and light-headedness.  Hematological: Negative for adenopathy. Does not bruise/bleed easily.  Psychiatric/Behavioral: Negative.     Objective:  BP 136/60 (BP Location: Left Arm, Patient Position: Sitting, Cuff Size: Large)   Pulse 79   Temp 98.5 F (36.9 C) (Oral)   Resp 16   Ht '6\' 8"'  (2.032 m)   Wt (!) 309 lb (140.2 kg)   SpO2 99%   BMI 33.95 kg/m   BP Readings from Last 3 Encounters:  04/18/19 136/60  04/18/19 (!) 152/70  10/31/18 (!) 124/58    Wt Readings from Last 3 Encounters:  04/18/19 (!) 309  lb (140.2 kg)  04/18/19 (!) 309 lb (140.2 kg)  10/31/18 (!) 303 lb 12 oz (137.8 kg)    Physical Exam Vitals signs reviewed.  Constitutional:      Appearance: He is obese. He is not ill-appearing or diaphoretic.  HENT:     Nose: Nose normal.     Mouth/Throat:     Mouth: Mucous membranes are moist.  Eyes:     General: No scleral icterus.    Conjunctiva/sclera: Conjunctivae normal.  Neck:     Musculoskeletal: Normal range of motion and neck supple. No neck rigidity.  Cardiovascular:     Rate and Rhythm: Normal rate and regular rhythm.     Heart sounds: No murmur. No gallop.   Pulmonary:     Breath sounds: No stridor. No  wheezing, rhonchi or rales.  Abdominal:     General: Abdomen is protuberant. Bowel sounds are normal.     Palpations: Abdomen is soft.     Tenderness: There is no abdominal tenderness.     Hernia: There is no hernia in the right inguinal area or left inguinal area.  Genitourinary:    Pubic Area: No rash.      Penis: Normal. No swelling or lesions.      Scrotum/Testes: Normal.        Right: Mass, tenderness or swelling not present.        Left: Mass, tenderness or swelling not present.     Epididymis:     Right: Normal. Not inflamed or enlarged.     Left: Normal. Not inflamed or enlarged.     Prostate: Enlarged (1+ BPH). Not tender and no nodules present.     Rectum: Normal. Guaiac result negative. No mass, tenderness, anal fissure, external hemorrhoid or internal hemorrhoid. Normal anal tone.  Musculoskeletal: Normal range of motion.        General: No swelling.     Right lower leg: Edema (trace) present.     Left lower leg: Edema (trace) present.  Feet:     Right foot:     Skin integrity: Ulcer and dry skin present. No erythema or warmth.     Left foot:     Skin integrity: Dry skin present. No ulcer, blister, erythema or warmth.     Comments: On the plantar side of the right 4th toe, distal phalanx, there is a 1/2 cm ulcer that is filled with blood.  There is no erythema, induration, fluctuance, or tenderness. Lymphadenopathy:     Cervical: No cervical adenopathy.     Lower Body: No right inguinal adenopathy. No left inguinal adenopathy.  Skin:    General: Skin is warm and dry.     Coloration: Skin is not pale.  Neurological:     General: No focal deficit present.     Mental Status: Mental status is at baseline.  Psychiatric:        Mood and Affect: Mood normal.     Lab Results  Component Value Date   WBC 6.3 04/18/2019   HGB 13.3 04/18/2019   HCT 39.2 04/18/2019   PLT 178.0 04/18/2019   GLUCOSE 167 (H) 04/18/2019   CHOL 137 04/18/2019   TRIG 80.0 04/18/2019   HDL  52.40 04/18/2019   LDLCALC 68 04/18/2019   ALT 13 04/18/2019   AST 12 04/18/2019   NA 138 04/18/2019   K 4.1 04/18/2019   CL 102 04/18/2019   CREATININE 0.95 04/18/2019   BUN 8 04/18/2019   CO2 29 04/18/2019  TSH 0.85 04/18/2019   PSA 1.13 04/18/2019   INR 1.05 10/17/2012   HGBA1C 9.0 (H) 04/18/2019   MICROALBUR 11.3 (H) 04/18/2019   Dg Shoulder Left  Result Date: 04/18/2019 CLINICAL DATA:  Fall out of chair.  Pain EXAM: LEFT SHOULDER - 2+ VIEW COMPARISON:  None. FINDINGS: Degenerative changes in the [facility] joint with joint space narrowing and spurring. Glenohumeral joint is maintained. No acute bony abnormality. Specifically, no fracture, subluxation, or dislocation. Soft tissues are intact. IMPRESSION: Moderate degenerative changes in the left AC joint. No acute bony abnormality. Electronically Signed   By: Rolm Baptise M.D.   On: 04/18/2019 11:00   Dg Foot Complete Right  Result Date: 04/18/2019 CLINICAL DATA:  Foot pain, open wound. EXAM: RIGHT FOOT COMPLETE - 3+ VIEW COMPARISON:  07/19/2018 FINDINGS: Prior amputation of the 1st through 3rd toes at the MTP joints. No acute bony abnormality. No fracture, subluxation or dislocation. No radiographic changes of osteomyelitis. No soft tissue gas. Degenerative changes in the hindfoot and midfoot. IMPRESSION: No acute bony abnormality. No radiographic evidence of acute osteomyelitis. Electronically Signed   By: Rolm Baptise M.D.   On: 04/18/2019 11:00     Assessment & Plan:   Nayson was seen today for hyperlipidemia, diabetes and annual exam.  Diagnoses and all orders for this visit:  Diabetes mellitus type 2 with atherosclerosis of arteries of extremities ([place])- This is being treated by podiatry and vascular surgery. -     Comprehensive metabolic panel; Future -     Hemoglobin A1c; Future -     Urinalysis, Routine w reflex microscopic; Future -     Microalbumin / creatinine urine ratio; Future -     HM Diabetes Foot Exam -      Ambulatory referral to Endocrinology -     Amb Referral to Nutrition and Diabetic E -     Consult to [facility] Care Management  Diabetes mellitus type 2, uncontrolled, with complications ([place])- His A1c is up to 9.0%.  His blood sugars are not adequately well controlled.  I have asked him to be more compliant with his medications and to follow-up with endocrinology and diabetic education. -     Comprehensive metabolic panel; Future -     Hemoglobin A1c; Future -     Urinalysis, Routine w reflex microscopic; Future -     Microalbumin / creatinine urine ratio; Future -     HM Diabetes Foot Exam -     Ambulatory referral to Endocrinology -     Amb Referral to Nutrition and Diabetic E -     Consult to [place] Management  Benign prostatic hyperplasia without lower urinary tract symptoms- His PSA is low which is reassuring that he does not have prostate cancer.  He has no symptoms that need to be treated. -     PSA; Future  Hyperlipidemia with target LDL less than 70- He has achieved his LDL goal and is doing well on the statin. -     Comprehensive metabolic panel; Future -     Lipid panel; Future -     TSH; Future  Routine general medical examination at a health care facility  Gastroesophageal reflux disease with esophagitis -     CBC with Differential/Platelet; Future  Acute pain of left shoulder- Plain films are negative for fracture but are positive for osteoarthritis.  I am concerned he may be developing a frozen shoulder so I have asked him to see sports medicine. -  DG Shoulder Left; Future -     Ambulatory referral to Sports Medicine  Diabetic ulcer of toe of right foot associated with type 2 diabetes mellitus, with fat layer exposed ([place])- Plain films are negative for foreign body, fracture, or osteomyelitis.  I have asked him to go back to see his foot doctor for evaluation and treatment. -     DG Foot Complete Right; Future -     Ambulatory referral to Podiatry -     HM Diabetes  Foot Exam   I have discontinued Creig Hines S. Wohl's phenazopyridine. I am also having him maintain his aspirin EC, docusate sodium, famotidine, Ertugliflozin L-PyroglutamicAc, TRUEplus Lancets 33G, glucose blood, OneTouch Verio, metFORMIN, glimepiride, and simvastatin.  No orders of the defined types were placed in this encounter.    Follow-up: Return in about 3 months (around 07/19/2019).  Scarlette Calico, MD

## 2019-04-18 NOTE — Patient Instructions (Signed)
Type 2 Diabetes Mellitus, Diagnosis, Adult Type 2 diabetes (type 2 diabetes mellitus) is a long-term (chronic) disease. In type 2 diabetes, one or both of these problems may be present:  The pancreas does not make enough of a hormone called insulin.  Cells in the body do not respond properly to insulin that the body makes (insulin resistance). Normally, insulin allows blood sugar (glucose) to enter cells in the body. The cells use glucose for energy. Insulin resistance or lack of insulin causes excess glucose to build up in the blood instead of going into cells. As a result, high blood glucose (hyperglycemia) develops. What increases the risk? The following factors may make you more likely to develop type 2 diabetes:  Having a family member with type 2 diabetes.  Being overweight or obese.  Having an inactive (sedentary) lifestyle.  Having been diagnosed with insulin resistance.  Having a history of prediabetes, gestational diabetes, or polycystic ovary syndrome (PCOS).  Being of American-Indian, African-American, Hispanic/Latino, or Asian/Pacific Islander descent. What are the signs or symptoms? In the early stage of this condition, you may not have symptoms. Symptoms develop slowly and may include:  Increased thirst (polydipsia).  Increased hunger(polyphagia).  Increased urination (polyuria).  Increased urination during the night (nocturia).  Unexplained weight loss.  Frequent infections that keep coming back (recurring).  Fatigue.  Weakness.  Vision changes, such as blurry vision.  Cuts or bruises that are slow to heal.  Tingling or numbness in the hands or feet.  Dark patches on the skin (acanthosis nigricans). How is this diagnosed? This condition is diagnosed based on your symptoms, your medical history, a physical exam, and your blood glucose level. Your blood glucose may be checked with one or more of the following blood tests:  A fasting blood glucose (FBG)  test. You will not be allowed to eat (you will fast) for 8 hours or longer before a blood sample is taken.  A random blood glucose test. This test checks blood glucose at any time of day regardless of when you ate.  An A1c (hemoglobin A1c) blood test. This test provides information about blood glucose control over the previous 2-3 months.  An oral glucose tolerance test (OGTT). This test measures your blood glucose at two times: ? After fasting. This is your baseline blood glucose level. ? Two hours after drinking a beverage that contains glucose. You may be diagnosed with type 2 diabetes if:  Your FBG level is 126 mg/dL (7.0 mmol/L) or higher.  Your random blood glucose level is 200 mg/dL (11.1 mmol/L) or higher.  Your A1c level is 6.5% or higher.  Your OGTT result is higher than 200 mg/dL (11.1 mmol/L). These blood tests may be repeated to confirm your diagnosis. How is this treated? Your treatment may be managed by a specialist called an endocrinologist. Type 2 diabetes may be treated by following instructions from your health care provider about:  Making diet and lifestyle changes. This may include: ? Following an individualized nutrition plan that is developed by a diet and nutrition specialist (registered dietitian). ? Exercising regularly. ? Finding ways to manage stress.  Checking your blood glucose level as often as told.  Taking diabetes medicines or insulin daily. This helps to keep your blood glucose levels in the healthy range. ? If you use insulin, you may need to adjust the dosage depending on how physically active you are and what foods you eat. Your health care provider will tell you how to adjust your dosage.    Taking medicines to help prevent complications from diabetes, such as: ? Aspirin. ? Medicine to lower cholesterol. ? Medicine to control blood pressure. Your health care provider will set individualized treatment goals for you. Your goals will be based on  your age, other medical conditions you have, and how you respond to diabetes treatment. Generally, the goal of treatment is to maintain the following blood glucose levels:  Before meals (preprandial): 80-130 mg/dL (4.4-7.2 mmol/L).  After meals (postprandial): below 180 mg/dL (10 mmol/L).  A1c level: less than 7%. Follow these instructions at home: Questions to ask your health care provider  Consider asking the following questions: ? Do I need to meet with a diabetes educator? ? Where can I find a support group for people with diabetes? ? What equipment will I need to manage my diabetes at home? ? What diabetes medicines do I need, and when should I take them? ? How often do I need to check my blood glucose? ? What number can I call if I have questions? ? When is my next appointment? General instructions  Take over-the-counter and prescription medicines only as told by your health care provider.  Keep all follow-up visits as told by your health care provider. This is important.  For more information about diabetes, visit: ? American Diabetes Association (ADA): www.diabetes.org ? American Association of Diabetes Educators (AADE): www.diabeteseducator.org Contact a health care provider if:  Your blood glucose is at or above 240 mg/dL (13.3 mmol/L) for 2 days in a row.  You have been sick or have had a fever for 2 days or longer, and you are not getting better.  You have any of the following problems for more than 6 hours: ? You cannot eat or drink. ? You have nausea and vomiting. ? You have diarrhea. Get help right away if:  Your blood glucose is lower than 54 mg/dL (3.0 mmol/L).  You become confused or you have trouble thinking clearly.  You have difficulty breathing.  You have moderate or large ketone levels in your urine. Summary  Type 2 diabetes (type 2 diabetes mellitus) is a long-term (chronic) disease. In type 2 diabetes, the pancreas does not make enough of a  hormone called insulin, or cells in the body do not respond properly to insulin that the body makes (insulin resistance).  This condition is treated by making diet and lifestyle changes and taking diabetes medicines or insulin.  Your health care provider will set individualized treatment goals for you. Your goals will be based on your age, other medical conditions you have, and how you respond to diabetes treatment.  Keep all follow-up visits as told by your health care provider. This is important. This information is not intended to replace advice given to you by your health care provider. Make sure you discuss any questions you have with your health care provider. Document Released: 11/01/2005 Document Revised: 06/02/2017 Document Reviewed: 12/05/2015 Elsevier Interactive Patient Education  2019 Elsevier Inc.  

## 2019-04-19 ENCOUNTER — Other Ambulatory Visit: Payer: Self-pay

## 2019-04-19 NOTE — Patient Outreach (Signed)
[place] [facility]) Care Management  04/19/2019  Jason [name] [birth year] [id number]   Telephone Screen  Referral Date: 04/19/2019 Referral Source: MD Office  Referral Reason: " poorly controlled DM" Insurance:[employer] Medicare   Outreach attempt # 1 to patient. No answer. RN CM left HIPAA compliant voicemail message along with contact info.    Plan: RN CM will  make outreach attempt to patient within 3-4 business days. RN CM will send unsuccessful outreach letter to patient.   [name], RN,BSN,CCM [place] [facility] Telephonic Care Management Coordinator Direct Phone: [phone] Toll Free: [phone] Fax: [phone]

## 2019-04-19 NOTE — Patient Outreach (Signed)
[facility]) Care Management  04/19/2019  Jason [name] [birth year] [id number]   Telephone Screen  Referral Date: 04/19/2019 Referral Source: MD Office  Referral Reason: " poorly controlled DM" Insurance:[employer] Medicare   Incoming call from patient returning RN CM call. Screening completed with patient.  Social: Patient resides in his home along with his spouse. He voices that he is independent with ADLs/IADLs. He denies any recent falls. He voices that he accidentally fell out of the chair about five months. He uses a "stick" to assist with ambulation. Patient drives himself to appts. Patient voices some overall financial hardships at times. He is aware of financial hardship program. He reports he has several unpaid MD office bills. RN CM inquired about possible Medicaid eligibility but patient voiced that him and his spouse income would not qualify them for it. Patient did not wish for any further assistance with the matter at this time.  Conditions: Per chart review, patient has PMH that includes DM, BPH,HLD, GERD, bladder cancer and diabetic foot ulcer. He states that he was told that his cancer was "under control" after completing six treatments and he is to take three more treatments sometime in July or later. Patient voices that he has been a diabetic for over 15 years. He is checking cbgs once a day in the mornings. He reports cbg this morning was 170 prior to eating. He reports appetite is good but that he does not know what he should and should not be eating. He voices that he has issues "managing/controlling" blood sugars. He is taking Metformin. He states that he sometimes "feels funny" but unable to tell the difference between hyper and hypoglycemia. RN CM provided some brief education to patient. Per records, last A1C was 8.7(Dec 2019). He is due to have A1C rechecked in the next few weeks. Patient states that he has had several toes amputated. He is currently dealing  with a possible infected toe. He is not applying anything to area except hydrogen peroxide. He has PCP appt on yesterday and was told by MD he would be referred to specialist for further eval and treatment. Patient also to be set up with nutrition and diabetes class by PCP office.    Mediations: Patient voices he is taking about five meds. He denies any issues at present managing and/or affording meds. He states that he was recently taken off several meds as he did not think they were neccessary and to decrease the cost of his meds.   Appointments: Patient saw PCP on 04/18/2019.  Consent: [facility] services reviewed and discussed with patient. Verbal consent for services given. Patient voices only needing further education/support in managing Diabetes at this time.   Plan: RN CM will send [place] RN Health Coach referral for further disease mgmt, education/support.   [name], RN,BSN,CCM [place] Management Telephonic Care Management Coordinator Direct Phone: [phone] Toll Free: [phone] Fax: [phone]

## 2019-04-20 ENCOUNTER — Encounter: Payer: Self-pay | Admitting: Endocrinology

## 2019-04-20 ENCOUNTER — Other Ambulatory Visit: Payer: Self-pay | Admitting: *Deleted

## 2019-04-26 DIAGNOSIS — H2513 Age-related nuclear cataract, bilateral: Secondary | ICD-10-CM | POA: Diagnosis not present

## 2019-04-26 DIAGNOSIS — E119 Type 2 diabetes mellitus without complications: Secondary | ICD-10-CM | POA: Diagnosis not present

## 2019-04-26 DIAGNOSIS — H5213 Myopia, bilateral: Secondary | ICD-10-CM | POA: Diagnosis not present

## 2019-04-26 DIAGNOSIS — H52203 Unspecified astigmatism, bilateral: Secondary | ICD-10-CM | POA: Diagnosis not present

## 2019-04-26 DIAGNOSIS — H524 Presbyopia: Secondary | ICD-10-CM | POA: Diagnosis not present

## 2019-04-26 LAB — HM DIABETES EYE EXAM

## 2019-04-27 ENCOUNTER — Other Ambulatory Visit: Payer: Self-pay

## 2019-04-27 ENCOUNTER — Telehealth: Payer: Self-pay | Admitting: Internal Medicine

## 2019-04-27 ENCOUNTER — Ambulatory Visit: Payer: HMO | Admitting: Podiatry

## 2019-04-27 ENCOUNTER — Ambulatory Visit (INDEPENDENT_AMBULATORY_CARE_PROVIDER_SITE_OTHER): Payer: HMO

## 2019-04-27 ENCOUNTER — Telehealth: Payer: Self-pay | Admitting: *Deleted

## 2019-04-27 VITALS — Temp 97.8°F

## 2019-04-27 DIAGNOSIS — M86171 Other acute osteomyelitis, right ankle and foot: Secondary | ICD-10-CM

## 2019-04-27 DIAGNOSIS — I70235 Atherosclerosis of native arteries of right leg with ulceration of other part of foot: Secondary | ICD-10-CM

## 2019-04-27 DIAGNOSIS — L97512 Non-pressure chronic ulcer of other part of right foot with fat layer exposed: Secondary | ICD-10-CM

## 2019-04-27 DIAGNOSIS — M8580 Other specified disorders of bone density and structure, unspecified site: Secondary | ICD-10-CM | POA: Diagnosis not present

## 2019-04-27 DIAGNOSIS — E0842 Diabetes mellitus due to underlying condition with diabetic polyneuropathy: Secondary | ICD-10-CM

## 2019-04-27 MED ORDER — CLINDAMYCIN HCL 300 MG PO CAPS: 300.0000 mg | ORAL_CAPSULE | Freq: Two times a day (BID) | ORAL | 0 refills | Status: DC

## 2019-04-27 NOTE — Telephone Encounter (Signed)
Left message [facility] - Crystal to call with arterial doppler scheduling dates.

## 2019-04-27 NOTE — Telephone Encounter (Signed)
[facility] - Crystal states she can schedule pt for 05/01/2019 2:00 and 3:00pm. I told Crystal I would send the orders immediately. Faxed orders to [facility].

## 2019-04-27 NOTE — Patient Instructions (Signed)
Pre-Operative Instructions  Congratulations, you have decided to take an important step towards improving your quality of life.  You can be assured that the doctors and staff at [facility] will be with you every step of the way.  Here are some important things you should know:  1. Plan to be at the surgery center/hospital at least 1 (one) hour prior to your scheduled time, unless otherwise directed by the surgical center/hospital staff.  You must have a responsible adult accompany you, remain during the surgery and drive you home.  Make sure you have directions to the surgical center/hospital to ensure you arrive on time. 2. If you are having surgery at [facility] or [facility] hospitals, you will need a copy of your medical history and physical form from your family physician within one month prior to the date of surgery. We will give you a form for your primary physician to complete.  3. We make every effort to accommodate the date you request for surgery.  However, there are times where surgery dates or times have to be moved.  We will contact you as soon as possible if a change in schedule is required.   4. No aspirin/ibuprofen for one week before surgery.  If you are on aspirin, any non-steroidal anti-inflammatory medications (Mobic, Aleve, Ibuprofen) should not be taken seven (7) days prior to your surgery.  You make take Tylenol for pain prior to surgery.  5. Medications - If you are taking daily heart and blood pressure medications, seizure, reflux, allergy, asthma, anxiety, pain or diabetes medications, make sure you notify the surgery center/hospital before the day of surgery so they can tell you which medications you should take or avoid the day of surgery. 6. No food or drink after midnight the night before surgery unless directed otherwise by surgical center/hospital staff. 7. No alcoholic beverages 24-hours prior to surgery.  No smoking 24-hours prior or 24-hours after  surgery. 8. Wear loose pants or shorts. They should be loose enough to fit over bandages, boots, and casts. 9. Don't wear slip-on shoes. Sneakers are preferred. 10. Bring your boot with you to the surgery center/hospital.  Also bring crutches or a walker if your physician has prescribed it for you.  If you do not have this equipment, it will be provided for you after surgery. 11. If you have not been contacted by the surgery center/hospital by the day before your surgery, call to confirm the date and time of your surgery. 12. Leave-time from work may vary depending on the type of surgery you have.  Appropriate arrangements should be made prior to surgery with your employer. 13. Prescriptions will be provided immediately following surgery by your doctor.  Fill these as soon as possible after surgery and take the medication as directed. Pain medications will not be refilled on weekends and must be approved by the doctor. 14. Remove nail polish on the operative foot and avoid getting pedicures prior to surgery. 15. Wash the night before surgery.  The night before surgery wash the foot and leg well with water and the antibacterial soap provided. Be sure to pay special attention to beneath the toenails and in between the toes.  Wash for at least three (3) minutes. Rinse thoroughly with water and dry well with a towel.  Perform this wash unless told not to do so by your physician.  Enclosed: 1 Ice pack (please put in freezer the night before surgery)   1 Hibiclens skin cleaner     Pre-op instructions  If you have any questions regarding the instructions, please do not hesitate to call our office.  [place]: [address], , [place] [address] -- [phone]  [place]: [address] [place], [place] [address] -- [phone]  [place]: [address]  [place], [place] [address] -- [phone]  [place]: [address], [address], [place], [place] [address] -- [phone]  Website:  [email or web address] 

## 2019-04-27 NOTE — Telephone Encounter (Signed)
[facility] - Jason [name] states they will not be able to schedule arterial doppler until July. I asked Jason [name] if he could use the ABI with TBI only and he stated he would prefer the Arterial as well.

## 2019-04-27 NOTE — Telephone Encounter (Signed)
Patient has dropped off a surgery clearance forms to be completed for [facility].   LOV: 04/18/19.   Forms have been placed in providers box.

## 2019-04-27 NOTE — Telephone Encounter (Signed)
Faxed orders to [facility]. 

## 2019-04-27 NOTE — Telephone Encounter (Signed)
-----   Message from Evelina Bucy, DPM sent at 04/27/2019  9:32 AM EDT ----- Can we get stat arterials - pending amputation surgery next week

## 2019-04-27 NOTE — Addendum Note (Signed)
Addended by: Harriett Sine D on: 04/27/2019 01:35 PM   Modules accepted: Orders

## 2019-04-30 ENCOUNTER — Other Ambulatory Visit: Payer: Self-pay | Admitting: *Deleted

## 2019-04-30 NOTE — Patient Outreach (Addendum)
[facility]) Care Management  04/30/2019   Jason [name] 02-Mar-1941 [id number]  RN Health Coach telephone call to patient.  [employer] compliance verified. Per patient his fasting blood sugar is 149. A1C is 9.0. It has increased from 8.7 within the past 3 months. Per patient his appetite is good. RN discussed nutritional classes. Patient will follow up once the amputation of toes has been completed.Per patient he is not exercising. Patient does not know the symptoms of hyper and hypoglycemia. Patient had a recent eye exam and was told he has a cataract but surgery is not needed yet per patient.  Patient has not has any recent falls. Patient is going to have toes amputated on rt foot Per patient not much pain. Patient has had several toes amputated in the past. Patient has agreed to follow up outreach calls.  Current Medications:  Current Outpatient Medications  Medication Sig Dispense Refill  . aspirin EC 81 MG tablet Take 1 tablet (81 mg total) by mouth daily. 90 tablet 3  . Blood Glucose Monitoring Suppl (ONETOUCH VERIO) w/Device KIT 1 Act by Does not apply route 3 (three) times daily. 1 kit 2  . clindamycin (CLEOCIN) 300 MG capsule Take 1 capsule (300 mg total) by mouth 2 (two) times a day. 14 capsule 0  . docusate sodium (COLACE) 100 MG capsule Take 1 capsule (100 mg total) by mouth 2 (two) times daily. 10 capsule 0  . Ertugliflozin L-PyroglutamicAc (STEGLATRO) 5 MG TABS Take 1 tablet by mouth daily. 90 tablet 1  . famotidine (PEPCID) 20 MG tablet Take 1 tablet (20 mg total) by mouth 2 (two) times daily. 10 tablet 0  . glimepiride (AMARYL) 2 MG tablet Take 1 tablet (2 mg total) by mouth daily with breakfast. 90 tablet 0  . glucose blood (TRUE METRIX BLOOD GLUCOSE TEST) test strip Use to test blood sugar twice daily. DX: E11.9 200 each 3  . metFORMIN (GLUCOPHAGE) 500 MG tablet Take 2 tablets (1,000 mg total) by mouth 2 (two) times daily. 360 tablet 0  . simvastatin (ZOCOR) 40 MG  tablet Take 1 tablet (40 mg total) by mouth every evening. 90 tablet 1  . TRUEplus Lancets 33G MISC Use to test blood sugar twice daily. DX: E11.9 200 each 3   No current facility-administered medications for this visit.     Functional Status:  In your present state of health, do you have any difficulty performing the following activities: 04/30/2019 04/18/2019  Hearing? N N  Vision? Y N  Comment Patient has cataracts but surgery is not planned at this time. -  Difficulty concentrating or making decisions? Y N  Comment Patient stated that sometimes his memory is not as good -  Walking or climbing stairs? Y N  Comment Patient has had some toes amputated from foot and stated that this week he is going to have the other toes removed -  Dressing or bathing? N N  Doing errands, shopping? N N  Preparing Food and eating ? N N  Using the Toilet? N N  In the past six months, have you accidently leaked urine? N N  Do you have problems with loss of bowel control? N N  Managing your Medications? N N  Managing your Finances? N N  Housekeeping or managing your Housekeeping? N N  Some recent data might be hidden    Fall/Depression Screening: Fall Risk  04/30/2019 04/19/2019 04/18/2019  Falls in the past year? _0 Number falls  in past yr: 0 1 0  Injury with Fall? _0 Risk for fall due to : History of fall(s);Impaired balance/gait;Impaired mobility - Impaired balance/gait;Impaired mobility  Risk for fall due to: Comment - - -  Follow up Falls evaluation completed;Falls prevention discussed - Falls prevention discussed   PHQ 2/9 Scores 04/30/2019 04/18/2019 01/25/2019 04/13/2018 02/07/2018 04/07/2017 01/15/2016  PHQ - 2 Score _1 0  PHQ- 9 Score 5 5 - _2 -  Exception Documentation - - - - - - -   [facility] CM Care Plan Problem One     Most Recent Value  Care Plan Problem One  Knowledge Deficit in Self Management of Diabetes  Role Documenting the Problem One  [place] for Problem  One  Active  THN Long Term Goal   Patient will rsee a decrease in A1C from 9.0 within the next 90 days  THN Long Term Goal Start Date  04/30/19  Interventions for Problem One Long Term Goal  RN discusse what the patient A1C is. RN discussed patient fasting blood sugar. RN sent patient living well with diabetes booklet. RN will follow up with further discussion  [facility] CM Short Term Goal #1   Patient will verbalize understanding hypo and hyperglycemia symptoms within the next 30 days  THN CM Short Term Goal #1 Start Date  04/30/19  Interventions for Short Term Goal #1  RN discussed hypo and hyperglycemia. RN sent a picture sheet of hyper and hypoglycemia with acion plan  THN CM Short Term Goal #2   Patient will verbalize having a better understanding of healthy snacks  THN CM Short Term Goal #2 Start Date  04/30/19  Interventions for Short Term Goal #2  RN discussed eating healthy snacks. RN sent patient a list of low carb snacks. RN discussed with patient about going to nutritional classes. RN will follow up with further discussion  THN CM Short Term Goal #3  Patient will verbalize making a better decision of take out food choices within the next 30 days  THN CM Short Term Goal #3 Start Date  04/30/19  Interventions for Short Tern Goal #3  RN discussed take out healthy food. RN sent a picture sheet to help patient make healthy food choices. RN will follow up with further discussion       Assessment:  A1C 9.0 Fasting blood sugar is 149 Appetite good Does not know symptoms of hypo and hyperglycemia Ulcers on toes rt foot No recent falls Patient will benefit from [place] telephonic outreach for education and support for diabetes self management.  Plan:  Patient will need to follow up for nutritional classes Patient will be having amputation of toes this week RN sent Living well with Diabetes Booklet RN discussed hyper and hypoglycemia RN discussed healthy snacks RN discussed eating out  healthy RN sent a picture sheet of hypo and hyperglycemia symptoms and action plan RN sent healthy low carb snack sheet RN sent a picture Dining out menu sheet to make better selection choices RN sent assessment and barriers letter to PCP RN will follow up within the month of July  Lyndzee Kliebert [place] Management [phone]

## 2019-04-30 NOTE — Progress Notes (Signed)
Subjective:  Patient ID: Jason [name], male    DOB: Jun 08, 1941,  MRN: [id number]  Chief Complaint  Patient presents with  . Diabetic Ulcer    diabetic ulcer of toe of right foot    78 y.o. male presents for wound care.  States that he has been wearing his normal shoes and noticed that the fourth toe started to become an issue.  Review of Systems: Negative except as noted in the HPI. Denies N/V/F/Ch.  Past Medical History:  Diagnosis Date  . Anxiety   . Asthma    in past, no inhaler now  . Benign neoplasm of colon   . Depression   . Diverticulosis of colon (without mention of hemorrhage)   . DJD (degenerative joint disease)   . Esophageal reflux   . Glaucoma   . Hidradenitis   . History of BPH   . History of gynecomastia    Unilateral  . Hyperlipidemia   . Irritable bowel syndrome   . Memory loss   . MVP (mitral valve prolapse)   . Obesity, unspecified   . Sleep apnea    does not use  cpap or bipap .. no study  ever  . Type II or unspecified type diabetes mellitus without mention of complication, not stated as uncontrolled   . Unspecified venous (peripheral) insufficiency     Current Outpatient Medications:  .  aspirin EC 81 MG tablet, Take 1 tablet (81 mg total) by mouth daily., Disp: 90 tablet, Rfl: 3 .  Blood Glucose Monitoring Suppl (ONETOUCH VERIO) w/Device KIT, 1 Act by Does not apply route 3 (three) times daily., Disp: 1 kit, Rfl: 2 .  clindamycin (CLEOCIN) 300 MG capsule, Take 1 capsule (300 mg total) by mouth 2 (two) times a day., Disp: 14 capsule, Rfl: 0 .  docusate sodium (COLACE) 100 MG capsule, Take 1 capsule (100 mg total) by mouth 2 (two) times daily., Disp: 10 capsule, Rfl: 0 .  Ertugliflozin L-PyroglutamicAc (STEGLATRO) 5 MG TABS, Take 1 tablet by mouth daily., Disp: 90 tablet, Rfl: 1 .  famotidine (PEPCID) 20 MG tablet, Take 1 tablet (20 mg total) by mouth 2 (two) times daily., Disp: 10 tablet, Rfl: 0 .  glimepiride (AMARYL) 2 MG tablet, Take 1 tablet  (2 mg total) by mouth daily with breakfast., Disp: 90 tablet, Rfl: 0 .  glucose blood (TRUE METRIX BLOOD GLUCOSE TEST) test strip, Use to test blood sugar twice daily. DX: E11.9, Disp: 200 each, Rfl: 3 .  metFORMIN (GLUCOPHAGE) 500 MG tablet, Take 2 tablets (1,000 mg total) by mouth 2 (two) times daily., Disp: 360 tablet, Rfl: 0 .  simvastatin (ZOCOR) 40 MG tablet, Take 1 tablet (40 mg total) by mouth every evening., Disp: 90 tablet, Rfl: 1 .  TRUEplus Lancets 33G MISC, Use to test blood sugar twice daily. DX: E11.9, Disp: 200 each, Rfl: 3  Social History   Tobacco Use  Smoking Status Current Some Day Smoker  . Packs/day: 0.10  . Years: 5.00  . Pack years: 0.50  . Types: Cigarettes  . Last attempt to quit: 09/05/2015  . Years since quitting: 3.6  Smokeless Tobacco Never Used  Tobacco Comment   smokes a cigarette per day    Allergies  Allergen Reactions  . Bactrim [Sulfamethoxazole-Trimethoprim] Rash   Objective:   Vitals:   04/27/19 0841  Temp: 97.8 F (36.6 C)   There is no height or weight on file to calculate BMI. Constitutional Well developed. Well nourished.  Vascular Dorsalis  pedis pulses non-palpable bilaterally. Posterior tibial pulses non-palpable bilaterally. Capillary refill normal to all digits.  No cyanosis or clubbing noted. Pedal hair growth normal.  Neurologic Normal speech. Oriented to person, place, and time. Protective sensation absent  Dermatologic Edema noted of the right fourth toe with hammertoe formation necrosis of the distal second digit with fibrotic wound base with palpable bone within the fibrosis. pre-ulcer submet 2/3  Orthopedic: No pain to palpation either foot. Partial TMA noted with 4th/5th toes intact Equinus noted.   Radiographs: Osteolysis noted in the distal fourth toe.  Prior partial first through third ray resections Assessment:   1. Ulcer of right foot with fat layer exposed ([place])   2. Bone erosion determined by x-ray   3.  Acute osteomyelitis of right ankle or foot ([place])    Plan:  Patient was evaluated and treated and all questions answered.  R 4th toe Osteomyelitis; PAD -XR reviewed as above. -Rx Cleocin given findings c/w osteomyelitis. -We will order noninvasive vascular studies given PAD.  To be performed prior to procedure -Discussed with patient proceeding with fourth toe amputation.  I did discuss that I am concerned that he will ultimately end of the fifth toe mutation I discussed probably best be served by fourth and fifth toe amputations with fourth metatarsal partial resection in order to better restore the parabola.  He is also developing a sub-met 2/3 ulcer that I am concerned we will further go on to ulcerate and I think he would benefit from tendo Achilles lengthening.  Discussed this with the patient and he verbalized understanding and is in agreement with proceeding with with the planned procedures including amputation of the fifth toe at this time rather than waiting for the fifth toe to become a problem later on. -Patient has failed all conservative therapy and wishes to proceed with surgical intervention. All risks, benefits, and alternatives discussed with patient. No guarantees given. Consent reviewed and signed by patient. -Planned procedures: R 4th/5th toe amputation, 4th metatarsal resection, TAL      Return for post op.

## 2019-04-30 NOTE — Telephone Encounter (Signed)
Thanks

## 2019-05-01 ENCOUNTER — Ambulatory Visit (HOSPITAL_COMMUNITY)
Admission: RE | Admit: 2019-05-01 | Discharge: 2019-05-01 | Disposition: A | Discharge status: Home / Self Care | Payer: HMO | Source: Ambulatory Visit | Attending: Podiatry | Admitting: Podiatry

## 2019-05-01 ENCOUNTER — Telehealth: Payer: Self-pay | Admitting: *Deleted

## 2019-05-01 ENCOUNTER — Other Ambulatory Visit: Payer: Self-pay

## 2019-05-01 DIAGNOSIS — I70235 Atherosclerosis of native arteries of right leg with ulceration of other part of foot: Secondary | ICD-10-CM

## 2019-05-01 DIAGNOSIS — L97512 Non-pressure chronic ulcer of other part of right foot with fat layer exposed: Secondary | ICD-10-CM | POA: Diagnosis not present

## 2019-05-01 NOTE — Telephone Encounter (Signed)
I am calling to let you know that we have you scheduled for surgery on Thursday of next week, May 10, 2019.  Your surgery is scheduled for 7:30 am.  You need to be at the facility about two hours prior to the surgery start time.  Have you had your history and physical form completed by your primary care physician?  "I took it over to Dr. Ronnald Ramp' office yesterday."  You will also need a Covid-19 test performed three days (Tuesday) prior to your surgery date.  A pre-admission testing scheduler will call you to schedule that appointment.  The test is performed at [facility].  It's a drive-thru process.  "Okay, thank you so much."  (Dr. March Rummage, please put in the pre-op and Covid-19 test orders.)

## 2019-05-01 NOTE — Telephone Encounter (Signed)
"  Jason [name] is scheduled for tomorrow.  We called him and he said he did not know he was having surgery tomorrow.  He said he can't do it then because he has to take his wife to her appointments."  I will call him.  He was informed when he was here on Friday.  "Also, he said he has drainage.  Was a culture done recently?"  He has Osteomyelitis.  Dr. March Rummage informed me that Jason [name]' surgery is supposed to be done at a [facility] facility.  I canceled the surgery at [facility].

## 2019-05-02 ENCOUNTER — Other Ambulatory Visit: Payer: Self-pay | Admitting: *Deleted

## 2019-05-02 DIAGNOSIS — Z01818 Encounter for other preprocedural examination: Secondary | ICD-10-CM

## 2019-05-03 ENCOUNTER — Telehealth: Payer: Self-pay | Admitting: *Deleted

## 2019-05-03 NOTE — Telephone Encounter (Signed)
DOS 05/10/2019; CPT CODES: 40397 - AMPUTATION TOE MPJ JOINT  4,5 ; 95369 - METATARSECTOMY 4TH; 22300 -TENDO-ACHILLES LENGTHENING RIGHT FOOT  [employer] ADVANTAGE:  Pre-certification is required  AUTHORIZATION # [id number] Valid 05/10/2019 - 08/08/2019

## 2019-05-07 ENCOUNTER — Other Ambulatory Visit (HOSPITAL_COMMUNITY)
Admission: RE | Admit: 2019-05-07 | Discharge: 2019-05-07 | Disposition: A | Discharge status: Home / Self Care | Payer: HMO | Source: Ambulatory Visit | Attending: Podiatry | Admitting: Podiatry

## 2019-05-07 ENCOUNTER — Telehealth: Payer: Self-pay | Admitting: *Deleted

## 2019-05-07 ENCOUNTER — Other Ambulatory Visit: Payer: Self-pay | Admitting: *Deleted

## 2019-05-07 DIAGNOSIS — Z1159 Encounter for screening for other viral diseases: Secondary | ICD-10-CM | POA: Diagnosis not present

## 2019-05-07 LAB — SARS CORONAVIRUS 2 (TAT 6-24 HRS): SARS Coronavirus 2: NEGATIVE

## 2019-05-07 NOTE — Telephone Encounter (Signed)
"  My husband, Esmond Hinch, is scheduled to have surgery on Thursday and he was told to go today, he thinks to [place] to be tested for Covid-19 but he's not sure.  So we're calling to see if you would know if the testing is at the [facility]."  I'm returning your call.  "My husband found out where he needed to be.  He's already gone."  Where did he go was it [facility]?  "No, it was [facility]."

## 2019-05-07 NOTE — Patient Outreach (Signed)
[facility]) Care Management  05/07/2019  Jason [name] 1940-11-26 [id number]  RN Health Coach is closing this program. Consumer is enrolled in [place] [facility] external program.  [facility] Care Management [phone]

## 2019-05-08 NOTE — Telephone Encounter (Signed)
I have not seen them.   Dr. Ronnald Ramp,  Do you still have them?

## 2019-05-08 NOTE — Telephone Encounter (Signed)
Do you know if this has been done?

## 2019-05-08 NOTE — Telephone Encounter (Signed)
NO, I have not seen it  TJ

## 2019-05-09 ENCOUNTER — Encounter (HOSPITAL_COMMUNITY): Payer: Self-pay | Admitting: Vascular Surgery

## 2019-05-09 ENCOUNTER — Telehealth: Payer: Self-pay | Admitting: *Deleted

## 2019-05-09 NOTE — Telephone Encounter (Signed)
"  I know that Mr. Howat' surgery is scheduled for tomorrow.  I can't remember if we sent you his history and physical for or not.  If I haven't can you send me the forms that need to be completed."  What is your fax number?  "It is [phone]."  I will send them over.  I faxed the history and physical form to [place], as requested.

## 2019-05-09 NOTE — Telephone Encounter (Signed)
I called [facility], they are sending me a new form to be completed by PCP.

## 2019-05-09 NOTE — Telephone Encounter (Signed)
"  I am calling in regards to Jason [name].  He's scheduled for surgery on tomorrow.  Dr. Ola Spurr is suggesting that Jason [name] have a Cardiology evaluation before having surgery.  He has mitral valve stenosis.  He last had an Echo on 01/14/2016.  I don't know if the surgery can be postponed or not based upon the severity of the problem.  Dr. March Rummage can feel free to contact me or Dr. Oren Bracket.  My number is [phone] and Dr. Blane Ohara number is [phone].  He will be available until 6 pm."  I'll let Dr. March Rummage know.

## 2019-05-09 NOTE — Telephone Encounter (Signed)
I called Sharee Pimple, scheduler at [facility] and canceled the surgery for tomorrow, 05/10/2019.   I'm calling to let you know that Dr. March Rummage said he's going to cancel Mr. Birks' surgery for tomorrow.  "Will you all be ordering the cardiac evaluation or does it have to come from the primary care physician?"  I am not sure, I'll ask Dr. March Rummage.  "Will you cancel the surgery with the surgical center?"  I had attempted but they said you were in his records.  "Okay, I'll get out."  Thank you for your help.  I called Mr. Jowett and informed him that we must cancel his surgery for tomorrow because it was recommended that he has a cardiac evaluation.  I scheduled him an appointment to come and see Dr. March Rummage in the morning at 8:15 am.

## 2019-05-09 NOTE — Telephone Encounter (Signed)
I went through all of my faxes and I do not have the form.

## 2019-05-09 NOTE — Progress Notes (Signed)
Anesthesia Chart Review: SAME DAY WORK-UP    Case: [id number] Date/Time: 05/10/19 0710   Procedures:      AMPUTATION TOE MPJ  FOURTH AND FIFTH (Right )     METATARSLSECTOMY FOURTH DIGIT (Right )     ACHILLES LENGTHENING/KIDNER (Right )   Anesthesia type: Monitor Anesthesia Care   Pre-op diagnosis: ULCER, BONE EROSION,OSTEOMYLITIS,   Location: [facility] ROOM 75 / [facility]   Surgeon: Evelina Bucy, DPM      DISCUSSION: Patient is a 78 year old male scheduled for the above procedure.  History includes smoking, DM2, venous insufficiency, GERD, IBS, BPH, hidradenitis, MVP (involving anterior leaflet 08/2010) with mild-moderate MS (01/2016), OSA, asthma, glaucoma, HLD, memory loss, toe amputations (right great, 3rd toe 2013), bladder tumor (s/p TURBT 07/07/18).   PCP [name], MD recently evaluated patient on 04/18/19 for annual exam. He is aware of surgery plans and visit is patient's H&P. No murmur documented. Last seen by cardiology for MVP/MS in 2017.   Last A1c 9.0. He will get a fasting CBG on arrival.   Negative presurgical COVID test on 05/07/19.   Discussed above with anesthesiologist Oren Bracket, MD. With mild-moderate MS > 3 years ago, he would recommend presurgical cardiology input as patient is likely due for updated echo. I have notified Delydia at Dr. Eleanora Neighbor office. If Dr. March Rummage has concerns about delaying surgery due to worsening infection then advised to speak with Dr. Ola Spurr.    PROVIDERS: [name], MD is PCP [facility]). Minus Breeding, MD is cardiologist. Last visit 01/23/16 with one year follow-up planned, but does not appear that this happened. Echo 01/14/16 showed mild-moderate MS which Dr. Percival Spanish classified as "mild" without symptoms. He did not specify when patient would require follow-up echocardiogram.   LABS: He is for updated labs on arrival. As of 04/18/19, Cr 0.95, H/H 13.3/39.2, PLT 178, A1c 9.0.   Spirometry 01/01/16: FVC 3.8 (74%),  FEV1 2.8 (75%0, FEV1/FVC 76% (103%), Mild restriction.   EKG: 06/14/18: Sinus rhythm with 1st degree A-V block with Premature atrial complexes No significant change since last tracing 11/19/15 Confirmed by Minus Breeding [phone]) on 06/14/2018 12:30:15 PM   CV: Echo 01/14/16: Study Conclusions - Left ventricle: The cavity size was normal. There was mild   concentric hypertrophy. Systolic function was normal. Wall motion   was normal; there were no regional wall motion abnormalities. - Aortic valve: Trileaflet; moderately thickened, moderately   calcified leaflets. Valve mobility was restricted. There was no   regurgitation. - Aortic root: The aortic root was normal in size. - Mitral valve: Moderately thickened, moderately calcified leaflets.    Leaflet separation was moderately reduced.  Mobility was not restricted.    The findings are consistent with mild to moderate stenosis. There was    Mild regurgitation.  Valve area by pressure half-time: 3.73 cm^2.  Indexed valve area by pressure half-time: 1.31 cm^2/m^2.  Valve area by continuity equation (using LVOT flow): 1.12 cm^2.  Indexed valve area by continuity equation (using LVOT flow): 0.39 cm^2/m^2.     Mean gradient (D): 6 mm Hg. Peak gradient (D): 5 mm Hg. - Left atrium: The atrium was moderately dilated. - Right ventricle: The cavity size was normal. Wall thickness was   normal. Systolic function was normal. - Right atrium: The atrium was normal in size. - Tricuspid valve: There was trivial regurgitation. - Pulmonary arteries: Systolic pressure was within the normal   range. - Inferior vena cava: The vessel was normal  in size. - Pericardium, extracardiac: There was no pericardial effusion.   Past Medical History:  Diagnosis Date  . Anxiety   . Asthma    in past, no inhaler now  . Benign neoplasm of colon   . Depression   . Diverticulosis of colon (without mention of hemorrhage)   . DJD (degenerative joint disease)   .  Esophageal reflux   . Glaucoma   . Hidradenitis   . History of BPH   . History of gynecomastia    Unilateral  . Hyperlipidemia   . Irritable bowel syndrome   . Memory loss   . MVP (mitral valve prolapse)   . Obesity, unspecified   . Sleep apnea    does not use  cpap or bipap .. no study  ever  . Type II or unspecified type diabetes mellitus without mention of complication, not stated as uncontrolled   . Unspecified venous (peripheral) insufficiency     Past Surgical History:  Procedure Laterality Date  . AMPUTATION  08/09/2012   Procedure: AMPUTATION DIGIT;  Surgeon: Newt Minion, MD;  Location: [facility];  Service: Orthopedics;  Laterality: Right;  Amputation right Great Toe MTP Joint  . AMPUTATION  10/17/2012   Procedure: AMPUTATION DIGIT;  Surgeon: Newt Minion, MD;  Location: [facility];  Service: Orthopedics;  Laterality: Right;  Right Foot 3rd Toe Amputation at Metatarsophalangeal Joint  . CHOLECYSTECTOMY N/A 06/14/2018   Procedure: LAPAROSCOPIC CHOLECYSTECTOMY WITH INTRAOPERATIVE CHOLANGIOGRAM;  Surgeon: Kieth Brightly Arta Bruce, MD;  Location: [facility];  Service: General;  Laterality: N/A;  . CIRCUMCISION  06/2100   For Phimosis - Dr Hartley Barefoot  . COLON SURGERY    . CYSTOSCOPY W/ RETROGRADES Bilateral 07/07/2018   Procedure: CYSTOSCOPY WITH BILATERAL RETROGRADE PYELOGRAM;  Surgeon: Ardis Hughs, MD;  Location: [facility];  Service: Urology;  Laterality: Bilateral;  . KNEE ARTHROSCOPY    . LAPAROSCOPIC LYSIS OF ADHESIONS  06/14/2018   Procedure: LAPAROSCOPIC LYSIS OF ADHESIONS;  Surgeon: Kieth Brightly Arta Bruce, MD;  Location: [facility];  Service: General;;  . LAPAROTOMY  07/20/11  . LASER ABLATION  06/2009   Left Greater Saphenous vein -Dr Donnetta Hutching  . TOE AMPUTATION  06/2009   x3Right foot second toe -Dr Beola Cord  . TONSILLECTOMY    . TRANSURETHRAL RESECTION OF BLADDER TUMOR N/A 07/07/2018   Procedure: TRANSURETHRAL RESECTION OF BLADDER TUMOR (TURBT) WITH POST OP INSTILLATION OF GEMCITABIN;   Surgeon: Ardis Hughs, MD;  Location: [facility];  Service: Urology;  Laterality: N/A;    MEDICATIONS: No current facility-administered medications for this encounter.    Marland Kitchen aspirin EC 81 MG tablet  . clindamycin (CLEOCIN) 300 MG capsule  . docusate sodium (COLACE) 100 MG capsule  . famotidine (PEPCID) 20 MG tablet  . glimepiride (AMARYL) 2 MG tablet  . metFORMIN (GLUCOPHAGE) 500 MG tablet  . simvastatin (ZOCOR) 40 MG tablet  . Skin Protectants, Misc. (EUCERIN) cream  . vitamin C (ASCORBIC ACID) 500 MG tablet  . Blood Glucose Monitoring Suppl (ONETOUCH VERIO) w/Device KIT  . Ertugliflozin L-PyroglutamicAc (STEGLATRO) 5 MG TABS  . glucose blood (TRUE METRIX BLOOD GLUCOSE TEST) test strip  . TRUEplus Lancets 33G MISC    Myra Gianotti, PA-C Surgical Short Stay/Anesthesiology [facility] Phone [phone] [facility] Phone [phone] 05/09/2019 3:52 PM

## 2019-05-10 ENCOUNTER — Ambulatory Visit (HOSPITAL_COMMUNITY): Admission: RE | Admit: 2019-05-10 | Payer: HMO | Source: Home / Self Care | Admitting: Podiatry

## 2019-05-10 ENCOUNTER — Ambulatory Visit: Payer: HMO | Admitting: Podiatry

## 2019-05-10 ENCOUNTER — Telehealth: Payer: Self-pay | Admitting: *Deleted

## 2019-05-10 ENCOUNTER — Other Ambulatory Visit: Payer: Self-pay

## 2019-05-10 ENCOUNTER — Other Ambulatory Visit: Payer: HMO

## 2019-05-10 VITALS — Temp 97.3°F

## 2019-05-10 DIAGNOSIS — I70235 Atherosclerosis of native arteries of right leg with ulceration of other part of foot: Secondary | ICD-10-CM

## 2019-05-10 DIAGNOSIS — L97514 Non-pressure chronic ulcer of other part of right foot with necrosis of bone: Secondary | ICD-10-CM | POA: Diagnosis not present

## 2019-05-10 DIAGNOSIS — Z01818 Encounter for other preprocedural examination: Secondary | ICD-10-CM

## 2019-05-10 HISTORY — DX: Rheumatic mitral stenosis: I05.0

## 2019-05-10 SURGERY — AMPUTATION, TOE
Anesthesia: Monitor Anesthesia Care | Laterality: Right

## 2019-05-10 MED ORDER — CLINDAMYCIN HCL 300 MG PO CAPS: 300.0000 mg | ORAL_CAPSULE | Freq: Three times a day (TID) | ORAL | 0 refills | Status: DC

## 2019-05-10 NOTE — Addendum Note (Signed)
Addended by: Lolita Rieger on: 05/10/2019 10:50 AM   Modules accepted: Orders

## 2019-05-10 NOTE — H&P (View-Only) (Signed)
Subjective:  Patient ID: Jason [name], male    DOB: 1940/12/07,  MRN: [id number]  Chief Complaint  Patient presents with  . Foot Problem    Right 4th toe ulcer check. Pt states it does not look good. Pt denies fever/nausea/vomiting/chills.    78 y.o. male presents for wound care. Was scheduled for surgery but per anesthesia needs cardiac clearance.  Review of Systems: Negative except as noted in the HPI. Denies N/V/F/Ch.  Past Medical History:  Diagnosis Date  . Anxiety   . Asthma    in past, no inhaler now  . Benign neoplasm of colon   . Depression   . Diverticulosis of colon (without mention of hemorrhage)   . DJD (degenerative joint disease)   . Esophageal reflux   . Glaucoma   . Hidradenitis   . History of BPH   . History of gynecomastia    Unilateral  . Hyperlipidemia   . Irritable bowel syndrome   . Memory loss   . Mitral stenosis    mild-moderate MS 01/2016  . MVP (mitral valve prolapse)   . Obesity, unspecified   . Sleep apnea    does not use  cpap or bipap .. no study  ever  . Type II or unspecified type diabetes mellitus without mention of complication, not stated as uncontrolled   . Unspecified venous (peripheral) insufficiency     Current Outpatient Medications:  .  aspirin EC 81 MG tablet, Take 1 tablet (81 mg total) by mouth daily., Disp: 90 tablet, Rfl: 3 .  Blood Glucose Monitoring Suppl (ONETOUCH VERIO) w/Device KIT, 1 Act by Does not apply route 3 (three) times daily., Disp: 1 kit, Rfl: 2 .  clindamycin (CLEOCIN) 300 MG capsule, Take 1 capsule (300 mg total) by mouth 3 (three) times daily., Disp: 21 capsule, Rfl: 0 .  docusate sodium (COLACE) 100 MG capsule, Take 1 capsule (100 mg total) by mouth 2 (two) times daily. (Patient taking differently: Take 100 mg by mouth 2 (two) times daily as needed (constipation.). ), Disp: 10 capsule, Rfl: 0 .  Ertugliflozin L-PyroglutamicAc (STEGLATRO) 5 MG TABS, Take 1 tablet by mouth daily., Disp: 90 tablet, Rfl: 1  .  famotidine (PEPCID) 20 MG tablet, Take 1 tablet (20 mg total) by mouth 2 (two) times daily. (Patient taking differently: Take 20 mg by mouth 2 (two) times daily as needed (heartburn/indigestion.). ), Disp: 10 tablet, Rfl: 0 .  glimepiride (AMARYL) 2 MG tablet, Take 1 tablet (2 mg total) by mouth daily with breakfast., Disp: 90 tablet, Rfl: 0 .  glucose blood (TRUE METRIX BLOOD GLUCOSE TEST) test strip, Use to test blood sugar twice daily. DX: E11.9, Disp: 200 each, Rfl: 3 .  metFORMIN (GLUCOPHAGE) 500 MG tablet, Take 2 tablets (1,000 mg total) by mouth 2 (two) times daily., Disp: 360 tablet, Rfl: 0 .  simvastatin (ZOCOR) 40 MG tablet, Take 1 tablet (40 mg total) by mouth every evening., Disp: 90 tablet, Rfl: 1 .  Skin Protectants, Misc. (EUCERIN) cream, Apply 1 application topically as needed for dry skin (ear itching.)., Disp: , Rfl:  .  TRUEplus Lancets 33G MISC, Use to test blood sugar twice daily. DX: E11.9, Disp: 200 each, Rfl: 3 .  vitamin C (ASCORBIC ACID) 500 MG tablet, Take 1,000 mg by mouth daily., Disp: , Rfl:   Social History   Tobacco Use  Smoking Status Current Some Day Smoker  . Packs/day: 0.10  . Years: 5.00  . Pack years: 0.50  .  Types: Cigarettes  . Last attempt to quit: 09/05/2015  . Years since quitting: 3.6  Smokeless Tobacco Never Used  Tobacco Comment   smokes a cigarette per day    Allergies  Allergen Reactions  . Bactrim [Sulfamethoxazole-Trimethoprim] Rash   Objective:   Vitals:   05/10/19 0840  Temp: (!) 97.3 F (36.3 C)   There is no height or weight on file to calculate BMI. Constitutional Well developed. Well nourished.  Vascular Dorsalis pedis pulses non-palpable bilaterally. Posterior tibial pulses non-palpable bilaterally. Capillary refill normal to all digits.  No cyanosis or clubbing noted. Pedal hair growth normal.  Neurologic Normal speech. Oriented to person, place, and time. Protective sensation absent  Dermatologic Edema and  necrosis of the distal 2nd toe. Distal aspect with necrosis, soft bone, no erythema, no ascending cellulitis. No purulence. pre-ulcer submet 2/3  Orthopedic: No pain to palpation either foot. Partial TMA noted with 4th/5th toes intact Equinus noted.   Radiographs: None today  Vascular studies:  Summary: Right: Resting right ankle-brachial index indicates noncompressible right lower extremity arteries. Waveforms are normal.   Left: Resting left ankle-brachial index indicates noncompressible left lower extremity arteries.The left toe-brachial index is normal. LT Great toe pressure = 166 mmHg. Waveforms are normal. Assessment:   1. Ulcer of great toe, right, with necrosis of bone ([place])    Plan:  Patient was evaluated and treated and all questions answered.  R 4th toe Osteomyelitis -Wound debrided as below. Wound debrided down to bone today. Bone very soft. Medically necessary to reduce bacterial burden and prevent risk of infection. -Per anesthesia needs cardiac eval prior to surgery. Will facilitate him seeing Dr. Percival Spanish who he has seen in the past.  -Will plan for surgery next week. If he is unable to have cardiac clearance by that time I think we may still need to proceed for I fear his infection will worsen without treatment. -Planned procedures: R 4th/5th toe amputation, 4th metatarsal resection, TAL     Procedure: Excisional Debridement of Wound Rationale: Removal of non-viable soft tissue from the wound to promote healing.  Anesthesia: none Pre-Debridement Wound Measurements: 1 cm x 1 cm x 0.3 cm  Post-Debridement Wound Measurements: 1 cm x 1.2 cm x 0.5 cm  Type of Debridement: Sharp Excisional Tissue Removed: Non-viable soft tissue, bone Depth of Debridement: bone Technique: Sharp excisional debridement to bleeding, viable wound base.  Dressing: Dry, sterile, compression dressing. Disposition: Patient tolerated procedure well. Patient to return in 1 week for follow-up.   PAD -Vascular studies reviewed -Non-compressible vessels but normal waveforms. May have difficulty healing post-op but will refer to vascular if poor healing occurs. No need for pre-op intervention given normal waveforms. No follow-ups on file.

## 2019-05-10 NOTE — Telephone Encounter (Signed)
I left Jason [name] some messages informing him that we have his surgery scheduled for May 17, 2019.  I told him he will get a call from someone in Pre-admitting.  I also informed him that we put in a referral to Dr. Rosezella [place] office.  I informed him that he should receive a call from a scheduler to make that appointment.    I faxed the order for the referral to [facility].

## 2019-05-10 NOTE — Progress Notes (Signed)
Subjective:  Patient ID: Jason [name], male    DOB: [birth year],  MRN: [id number]  Chief Complaint  Patient presents with  . Foot Problem    Right 4th toe ulcer check. Pt states it does not look good. Pt denies fever/nausea/vomiting/chills.    78 y.o. male presents for wound care. Was scheduled for surgery but per anesthesia needs cardiac clearance.  Review of Systems: Negative except as noted in the HPI. Denies N/V/F/Ch.  Past Medical History:  Diagnosis Date  . Anxiety   . Asthma    in past, no inhaler now  . Benign neoplasm of colon   . Depression   . Diverticulosis of colon (without mention of hemorrhage)   . DJD (degenerative joint disease)   . Esophageal reflux   . Glaucoma   . Hidradenitis   . History of BPH   . History of gynecomastia    Unilateral  . Hyperlipidemia   . Irritable bowel syndrome   . Memory loss   . Mitral stenosis    mild-moderate MS 01/2016  . MVP (mitral valve prolapse)   . Obesity, unspecified   . Sleep apnea    does not use  cpap or bipap .. no study  ever  . Type II or unspecified type diabetes mellitus without mention of complication, not stated as uncontrolled   . Unspecified venous (peripheral) insufficiency     Current Outpatient Medications:  .  aspirin EC 81 MG tablet, Take 1 tablet (81 mg total) by mouth daily., Disp: 90 tablet, Rfl: 3 .  Blood Glucose Monitoring Suppl (ONETOUCH VERIO) w/Device KIT, 1 Act by Does not apply route 3 (three) times daily., Disp: 1 kit, Rfl: 2 .  clindamycin (CLEOCIN) 300 MG capsule, Take 1 capsule (300 mg total) by mouth 3 (three) times daily., Disp: 21 capsule, Rfl: 0 .  docusate sodium (COLACE) 100 MG capsule, Take 1 capsule (100 mg total) by mouth 2 (two) times daily. (Patient taking differently: Take 100 mg by mouth 2 (two) times daily as needed (constipation.). ), Disp: 10 capsule, Rfl: 0 .  Ertugliflozin L-PyroglutamicAc (STEGLATRO) 5 MG TABS, Take 1 tablet by mouth daily., Disp: 90 tablet, Rfl: 1  .  famotidine (PEPCID) 20 MG tablet, Take 1 tablet (20 mg total) by mouth 2 (two) times daily. (Patient taking differently: Take 20 mg by mouth 2 (two) times daily as needed (heartburn/indigestion.). ), Disp: 10 tablet, Rfl: 0 .  glimepiride (AMARYL) 2 MG tablet, Take 1 tablet (2 mg total) by mouth daily with breakfast., Disp: 90 tablet, Rfl: 0 .  glucose blood (TRUE METRIX BLOOD GLUCOSE TEST) test strip, Use to test blood sugar twice daily. DX: E11.9, Disp: 200 each, Rfl: 3 .  metFORMIN (GLUCOPHAGE) 500 MG tablet, Take 2 tablets (1,000 mg total) by mouth 2 (two) times daily., Disp: 360 tablet, Rfl: 0 .  simvastatin (ZOCOR) 40 MG tablet, Take 1 tablet (40 mg total) by mouth every evening., Disp: 90 tablet, Rfl: 1 .  Skin Protectants, Misc. (EUCERIN) cream, Apply 1 application topically as needed for dry skin (ear itching.)., Disp: , Rfl:  .  TRUEplus Lancets 33G MISC, Use to test blood sugar twice daily. DX: E11.9, Disp: 200 each, Rfl: 3 .  vitamin C (ASCORBIC ACID) 500 MG tablet, Take 1,000 mg by mouth daily., Disp: , Rfl:   Social History   Tobacco Use  Smoking Status Current Some Day Smoker  . Packs/day: 0.10  . Years: 5.00  . Pack years: 0.50  .  Types: Cigarettes  . Last attempt to quit: 09/05/2015  . Years since quitting: 3.6  Smokeless Tobacco Never Used  Tobacco Comment   smokes a cigarette per day    Allergies  Allergen Reactions  . Bactrim [Sulfamethoxazole-Trimethoprim] Rash   Objective:   Vitals:   05/10/19 0840  Temp: (!) 97.3 F (36.3 C)   There is no height or weight on file to calculate BMI. Constitutional Well developed. Well nourished.  Vascular Dorsalis pedis pulses non-palpable bilaterally. Posterior tibial pulses non-palpable bilaterally. Capillary refill normal to all digits.  No cyanosis or clubbing noted. Pedal hair growth normal.  Neurologic Normal speech. Oriented to person, place, and time. Protective sensation absent  Dermatologic Edema and  necrosis of the distal 2nd toe. Distal aspect with necrosis, soft bone, no erythema, no ascending cellulitis. No purulence. pre-ulcer submet 2/3  Orthopedic: No pain to palpation either foot. Partial TMA noted with 4th/5th toes intact Equinus noted.   Radiographs: None today  Vascular studies:  Summary: Right: Resting right ankle-brachial index indicates noncompressible right lower extremity arteries. Waveforms are normal.   Left: Resting left ankle-brachial index indicates noncompressible left lower extremity arteries.The left toe-brachial index is normal. LT Great toe pressure = 166 mmHg. Waveforms are normal. Assessment:   1. Ulcer of great toe, right, with necrosis of bone ([place])    Plan:  Patient was evaluated and treated and all questions answered.  R 4th toe Osteomyelitis -Wound debrided as below. Wound debrided down to bone today. Bone very soft. Medically necessary to reduce bacterial burden and prevent risk of infection. -Per anesthesia needs cardiac eval prior to surgery. Will facilitate him seeing Dr. Percival Spanish who he has seen in the past.  -Will plan for surgery next week. If he is unable to have cardiac clearance by that time I think we may still need to proceed for I fear his infection will worsen without treatment. -Planned procedures: R 4th/5th toe amputation, 4th metatarsal resection, TAL     Procedure: Excisional Debridement of Wound Rationale: Removal of non-viable soft tissue from the wound to promote healing.  Anesthesia: none Pre-Debridement Wound Measurements: 1 cm x 1 cm x 0.3 cm  Post-Debridement Wound Measurements: 1 cm x 1.2 cm x 0.5 cm  Type of Debridement: Sharp Excisional Tissue Removed: Non-viable soft tissue, bone Depth of Debridement: bone Technique: Sharp excisional debridement to bleeding, viable wound base.  Dressing: Dry, sterile, compression dressing. Disposition: Patient tolerated procedure well. Patient to return in 1 week for follow-up.   PAD -Vascular studies reviewed -Non-compressible vessels but normal waveforms. May have difficulty healing post-op but will refer to vascular if poor healing occurs. No need for pre-op intervention given normal waveforms. No follow-ups on file.

## 2019-05-10 NOTE — Telephone Encounter (Signed)
SURGERY WAS RESCHEDULED TO 05/17/2019.  He needed a cardiac evaluation.

## 2019-05-10 NOTE — Telephone Encounter (Signed)
LVM for [facility] to make sure they got out fax back.

## 2019-05-10 NOTE — Telephone Encounter (Signed)
Patient should be coming in today forappointment

## 2019-05-10 NOTE — Patient Outreach (Signed)
[facility]) Care Management Chronic Special Needs Program  05/10/2019  Name: Jason [name] DOB: [birth year]  MRN: [id number]  [name] is enrolled in a chronic special needs plan for Diabetes. Chronic Care Management Coordinator telephoned client to review health risk assessment and to develop individualized care plan.  Introduced the chronic care management program, importance of client participation, and taking their care plan to all provider appointments and inpatient facilities.  Reviewed the transition of care process and possible referral to community care management.  Subjective: Client states that he is to have two toes amputated next week on his rt foot.  States that he has been trying to eat better and take his medications as ordered to keep his blood sugars down.  States that they have been ranging 130-180 for the last few weeks.  States that he usually is able to care for himself but when he has his surgery he is not sure how he will be able as his wife is not able to help him much.  States that he currently drives but will not be able to drive after his surgery for a while.  States he has a Company secretary or his sister who might be able to help him.  States he is worried about his finances as money is tight for his medications and medical bills. States he is forgetful at times but thinks it is old age.   Goals Addressed            This Visit's Progress   . Advanced Care Planning complete by next 9 months      . Client understands the importance of follow-up with providers by attending scheduled visits      . Client will have ADL/IADL needs addressed by next 6 months      . Client will report improved coping with diabetes by next 3 months      . Client will report no worsening of symptoms related to heart disease within the next 9 months      . Client will use Assistive Devices as needed and verbalize understanding of device use      . Client will verbalize  understanding of treatment plan for impaired skin integrity and follow up with provider by next 3 months      . Client/Caregiver will verbalize understanding of instructions related to self-care and safety      . Decrease inpatient admissions/ readmissions with in the next year      . Decrease inpatient diabetes admissions/readmissions with in the year      . HEMOGLOBIN A1C < 7.0       Diabetes self management actions:  Glucose monitoring per provider recommendations  Perform Quality checks on blood meter  Eat Healthy  Check feet daily  Visit provider every 3-6 months as directed  Hbg A1C level every 3-6 months.  Eye Exam yearly    . Maintain timely refills of diabetic medication as prescribed within the year .      Marland Kitchen COMPLETED: Obtain annual  Lipid Profile, LDL-C       Completed 04/18/19    . COMPLETED: Obtain Annual Eye (retinal)  Exam        Completed 04/26/19    . COMPLETED: Obtain Annual Foot Exam       Completed 04/18/19    . COMPLETED: Obtain annual screen for micro albuminuria (urine) , nephropathy (kidney problems)       Completed 04/18/19    . Obtain  Hemoglobin A1C at least 2 times per year      . Visit Primary Care Provider or Endocrinologist at least 2 times per year        Client is not meeting diabetes self management goal of hemoglobin A1C of <7% with last reading of 9%.  Client agreeable to referral to social worker for financial issues, help with IADL's, transportation and Advanced Directives.  Client agreeable to referral to pharmacy for medication review and medication assistance. Instructed on low CHO diet and portion control Instructed on the importance of glycemic control for wound healing and prevention of complications. Reviewed number for 24 hour nurse Line Discussed COVID19 cause, symptoms, precautions (social distancing, stay at home order, hand washing), confirmed client knows how to contact provider.  Plan:  Send successful outreach letter with a copy  of their individualized care plan, Send individual care plan to provider and Send educational material-heart disease, memory loss, Advanced Directives   Chronic care management coordination will outreach in:  3 Months  Will refer client to:  Social Work and [place] RN, [place], [place] Programmer, multimedia Care Management [phone]

## 2019-05-11 NOTE — Telephone Encounter (Signed)
Mr. Vanwyk has an appointment with Dr. Percival Spanish on 05/15/2019.

## 2019-05-13 NOTE — Telephone Encounter (Signed)
Noted thanks °

## 2019-05-14 ENCOUNTER — Ambulatory Visit (INDEPENDENT_AMBULATORY_CARE_PROVIDER_SITE_OTHER): Payer: HMO | Admitting: Physician Assistant

## 2019-05-14 ENCOUNTER — Other Ambulatory Visit: Payer: Self-pay

## 2019-05-14 ENCOUNTER — Encounter: Payer: Self-pay | Admitting: Physician Assistant

## 2019-05-14 ENCOUNTER — Other Ambulatory Visit: Payer: Self-pay | Admitting: Pharmacist

## 2019-05-14 ENCOUNTER — Telehealth: Payer: Self-pay | Admitting: *Deleted

## 2019-05-14 ENCOUNTER — Telehealth: Payer: Self-pay

## 2019-05-14 ENCOUNTER — Other Ambulatory Visit (HOSPITAL_COMMUNITY)
Admission: RE | Admit: 2019-05-14 | Discharge: 2019-05-14 | Disposition: A | Discharge status: Home / Self Care | Payer: HMO | Source: Ambulatory Visit | Attending: Podiatry | Admitting: Podiatry

## 2019-05-14 VITALS — BP 118/58 | HR 71 | Temp 97.3°F | Ht >= 80 in | Wt 307.6 lb

## 2019-05-14 DIAGNOSIS — Z01810 Encounter for preprocedural cardiovascular examination: Secondary | ICD-10-CM

## 2019-05-14 DIAGNOSIS — Z1159 Encounter for screening for other viral diseases: Secondary | ICD-10-CM | POA: Insufficient documentation

## 2019-05-14 DIAGNOSIS — I050 Rheumatic mitral stenosis: Secondary | ICD-10-CM

## 2019-05-14 DIAGNOSIS — Z01812 Encounter for preprocedural laboratory examination: Secondary | ICD-10-CM | POA: Insufficient documentation

## 2019-05-14 DIAGNOSIS — E785 Hyperlipidemia, unspecified: Secondary | ICD-10-CM | POA: Diagnosis not present

## 2019-05-14 DIAGNOSIS — E1142 Type 2 diabetes mellitus with diabetic polyneuropathy: Secondary | ICD-10-CM

## 2019-05-14 LAB — SARS CORONAVIRUS 2 (TAT 6-24 HRS): SARS Coronavirus 2: NEGATIVE

## 2019-05-14 NOTE — Progress Notes (Signed)
   Primary Cardiologist: Minus Breeding, MD  Chart reviewed as part of pre-operative protocol coverage. Patient was seen in the cardiology clinic on 05/14/2019 at which time he is doing well without chest pain or shortness of breath. EKG showed normal rhythm with 1st degree AV block which is benign. He does not have history of CAD or recent angina. Although he is due for repeat echocardiogram given prior history of mitral stenosis and rheumatic fever, however he does not have significant heart murmur on exam nor does he has any heart failure symptom.   Therefore, based on ACC/AHA guidelines, the patient would be at acceptable risk for the planned procedure without further cardiovascular testing. He is a low risk patient for a low risk surgery. The case was discussed with DOD Dr. Debara Pickett, at this time, we recommend no further workup prior to the surgery. An echocardiogram will be ordered for after the surgery.   I will route this recommendation to the requesting party via Epic fax function.  Please call with questions.  [place], [place] 05/14/2019, 12:16 PM

## 2019-05-14 NOTE — Patient Outreach (Addendum)
[place] [facility]) Care Management  05/14/2019  KENDAN CORNFORTH 10/26/1941 [id number]    Unsuccessful outreach to patient regarding social work referral from [employer], [employer].   "CSNP client with concerns about finances, he needs help with IADL's, transportation and Advanced Directives. He is scheduled for surgery next week and concerned about help with dressing changes and transportation" Left voicemail message.  Unsuccessful outreach letter mailed.  Will attempt to reach again tomorrow.    BSW received return call from patient.   BSW and patient discussed transportation.  Patient reports that he and his wife have been encouraged not to ride together due to his upcoming surgery and COVID19 risks.  He is unsure if this will be the case after surgery so he is not certain of transportation needs at this time.  BSW agreed to follow up with him next week after surgery. Discussed [employer] 2-1-1 as option for financial resources and he consented to referral being submitted.  Patient reports that he already has Advance Directive documentation but it has not yet been completed.  He denied questions at this time but BSW encouraged him to call if questions arise.     Ronn Melena, BSW Social Worker [phone]

## 2019-05-14 NOTE — Patient Outreach (Signed)
[facility]) Care Management  [place]   05/14/2019  Jason [name] 06-13-1941 [id number]  Reason for referral: Medication Assistance, Medication Review  Referral source: [employer] Care Manager with [facility] Current insurance: [employer]  PMHx includes but not limited to:  T2DM, mitral valve disease, hx rheumatic fever, HLD, bladder cancer s/p TURP 8/'19, anxiety / depression, DJD, IBS /diverticulosis, glaucoma,  infected diabetic toe ulcer with plans for amputation 05/17/19.    Outreach:  Successful telephone call with Mr. Nygaard.  HIPAA identifiers verified.   Subjective:  Patient reports he self-manages medications and has no issues with adherence.  He reports he knows that this diabetes is not controlled but he is working on diet and exercise at home to reduce blood sugars.  He states his average blood sugars are now in the 140s.  He denies needing any medication assistance at this time and does not want to start any new medications for diabetes.  He reports he is not taking Steglarto currently and does not remember being on this medication.   Objective: The 10-year ASCVD risk score Mikey Bussing DC Jr., et al., 2013) is: 32.8%   Values used to calculate the score:     Age: 78 years     Sex: Male     Is Non-Hispanic African American: Yes     Diabetic: Yes     Tobacco smoker: Yes     Systolic Blood Pressure: 378 mmHg     Is BP treated: No     HDL Cholesterol: 52.4 mg/dL     Total Cholesterol: 137 mg/dL  Lab Results  Component Value Date   CREATININE 0.95 04/18/2019   CREATININE 1.23 10/31/2018   CREATININE 1.16 07/05/2018    Lab Results  Component Value Date   HGBA1C 9.0 (H) 04/18/2019    Lipid Panel     Component Value Date/Time   CHOL 137 04/18/2019 1035   TRIG 80.0 04/18/2019 1035   HDL 52.40 04/18/2019 1035   CHOLHDL 3 04/18/2019 1035   VLDL 16.0 04/18/2019 1035   LDLCALC 68 04/18/2019 1035    BP Readings from  Last 3 Encounters:  05/14/19 (!) 118/58  04/18/19 136/60  04/18/19 (!) 152/70    Allergies  Allergen Reactions  . Bactrim [Sulfamethoxazole-Trimethoprim] Rash    Medications Reviewed Today    Reviewed by Almyra Deforest, [place] (Physician Assistant) on 05/14/19 at 1305  Med List Status: <None>  Medication Order Taking? Sig Documenting Provider Last Dose Status Informant  aspirin EC 81 MG tablet [id number] Yes Take 1 tablet (81 mg total) by mouth daily. [name], MD Taking Active Self  Blood Glucose Monitoring Suppl [facility] VERIO) w/Device Drucie Opitz [id number] Yes 1 Act by Does not apply route 3 (three) times daily. [name], MD Taking Active Self  clindamycin (CLEOCIN) 300 MG capsule [id number] Yes Take 1 capsule (300 mg total) by mouth 3 (three) times daily. Evelina Bucy, DPM Taking Active   docusate sodium (COLACE) 100 MG capsule [id number] Yes Take 1 capsule (100 mg total) by mouth 2 (two) times daily.  Patient taking differently: Take 100 mg by mouth 2 (two) times daily as needed (constipation.).    Dessa Phi, DO Taking Active Self  Ertugliflozin L-PyroglutamicAc [facility]) 5 MG TABS [id number] Yes Take 1 tablet by mouth daily. [name], MD Taking Active Self  famotidine (PEPCID) 20 MG tablet [id number] Yes Take 1 tablet (20 mg total) by mouth 2 (two) times  daily.  Patient taking differently: Take 20 mg by mouth 2 (two) times daily as needed (heartburn/indigestion.).    [name], Harrell Gave, PA-C Taking Active Self  glimepiride (AMARYL) 2 MG tablet [id number] Yes Take 1 tablet (2 mg total) by mouth daily with breakfast. Biagio Borg, MD Taking Active Self  glucose blood (TRUE METRIX BLOOD GLUCOSE TEST) test strip [id number] Yes Use to test blood sugar twice daily. DX: E11.9 [name], MD Taking Active Self  metFORMIN (GLUCOPHAGE) 500 MG tablet [id number] Yes Take 2 tablets (1,000 mg total) by mouth 2 (two) times daily. Biagio Borg, MD Taking Active Self  simvastatin  (ZOCOR) 40 MG tablet [id number] Yes Take 1 tablet (40 mg total) by mouth every evening. Biagio Borg, MD Taking Active Self  Skin Protectants, Misc. (EUCERIN) cream [id number] Yes Apply 1 application topically as needed for dry skin (ear itching.). [provider] Taking Active Self  TRUEplus Lancets 33G [place] [id number] Yes Use to test blood sugar twice daily. DX: E11.9 [name], MD Taking Active Self  vitamin C (ASCORBIC ACID) 500 MG tablet [id number] Yes Take 1,000 mg by mouth daily. [provider] Taking Active Self          Assessment: Drugs sorted by system:  Hematologic: aspirin 4m  Cardiovascular: simvastatin  Gastrointestinal: docusatefamotidine  Endocrine:glimepiride, metformin, ertugliflozin  Infectious Diseases: clindamycin  Vitamins/Minerals/Supplements: vitamin C   Medication Review Findings:  . Ertugliflozin: Not taking and not interested in restarting even if approved for patient assistance program.  States he has discussed this with his PCP, Dr. JRonnald Ramp   . Glimepiride: Not recommended in geriatric patients due to risk for hypoglycemia. Monitor closely and adjust as clinically warranted.    [facility]: . [facility] no further medication needs identified at this time.  Am happy to assist in the future as needed.  Provided patient with my contact information if he would like to contact me in the future.   CRalene Bathe PharmD, [phone]

## 2019-05-14 NOTE — Progress Notes (Signed)
Cardiology Office Note    Date:  05/14/2019   ID:  Jason [name], DOB [birth year], MRN [id number]  PCP:  [name], MD  Cardiologist:  Dr. Percival Spanish  Chief Complaint  Patient presents with  . Pre-op Exam    requested by Anesthesia. Amputation of R toe by Dr. March Rummage    History of Present Illness:  Jason [name] is a 78 y.o. male with past medical history of hyperlipidemia, obstructive sleep apnea, obesity, DM 2, history of rheumatic fever at age 82 and mitral valve disease.  Previous echocardiogram in 2011 suggestive of mitral valve prolapse.  Patient was last seen by Dr. Percival Spanish in 2017.  Echocardiogram in March 2017 showed mild LVH, normal EF, mild to moderate mitral stenosis with mild MR. He underwent laparoscopic cholecystectomy on 06/14/2018 and transurethral resection of the bladder tumor on 07/04/2018.  He is planning to proceed with amputation of right fourth fifth toe by Dr. Hardie Pulley.  He has a longstanding history of diabetes and has recently failed antibiotic therapy for infected toe with ulcer.  Lower extremity ABI showed good blood flow.  He presented to cardiology service per for preoperative clearance.  He denies any recent chest pain or shortness of breath.  He does ambulate with a cane to the bathroom and bedroom otherwise his functional ability is severely limited by right foot pain.  He cannot perform 4 METS of activity at this time.  He denies any orthopnea or PND.  On physical exam, he does not have any lower extremity edema or heart murmur.  I discussed the case with DOD Dr. Debara Pickett, given the worsening infection of his toe, we felt patient should proceed with the surgery without further cardiac work-up.  He does not have any prior history of CAD nor does he have any recent anginal symptoms.  Given history of mitral stenosis, he will need an echocardiogram, however this does not need to be done prior to the surgery.  EKG today showed sinus rhythm with first-degree AV  block.    Past Medical History:  Diagnosis Date  . Anxiety   . Asthma    in past, no inhaler now  . Benign neoplasm of colon   . Depression   . Diverticulosis of colon (without mention of hemorrhage)   . DJD (degenerative joint disease)   . Esophageal reflux   . Glaucoma   . Hidradenitis   . History of BPH   . History of gynecomastia    Unilateral  . Hyperlipidemia   . Irritable bowel syndrome   . Memory loss   . Mitral stenosis    mild-moderate MS 01/2016  . MVP (mitral valve prolapse)   . Obesity, unspecified   . Sleep apnea    does not use  cpap or bipap .. no study  ever  . Type II or unspecified type diabetes mellitus without mention of complication, not stated as uncontrolled   . Unspecified venous (peripheral) insufficiency     Past Surgical History:  Procedure Laterality Date  . AMPUTATION  08/09/2012   Procedure: AMPUTATION DIGIT;  Surgeon: Newt Minion, MD;  Location: [facility];  Service: Orthopedics;  Laterality: Right;  Amputation right Great Toe MTP Joint  . AMPUTATION  10/17/2012   Procedure: AMPUTATION DIGIT;  Surgeon: Newt Minion, MD;  Location: [facility];  Service: Orthopedics;  Laterality: Right;  Right Foot 3rd Toe Amputation at Metatarsophalangeal Joint  . CHOLECYSTECTOMY N/A 06/14/2018   Procedure: LAPAROSCOPIC CHOLECYSTECTOMY WITH  INTRAOPERATIVE CHOLANGIOGRAM;  Surgeon: Kinsinger, Arta Bruce, MD;  Location: [facility];  Service: General;  Laterality: N/A;  . CIRCUMCISION  06/2100   For Phimosis - Dr Hartley Barefoot  . COLON SURGERY    . CYSTOSCOPY W/ RETROGRADES Bilateral 07/07/2018   Procedure: CYSTOSCOPY WITH BILATERAL RETROGRADE PYELOGRAM;  Surgeon: Ardis Hughs, MD;  Location: [facility];  Service: Urology;  Laterality: Bilateral;  . KNEE ARTHROSCOPY    . LAPAROSCOPIC LYSIS OF ADHESIONS  06/14/2018   Procedure: LAPAROSCOPIC LYSIS OF ADHESIONS;  Surgeon: Kieth Brightly Arta Bruce, MD;  Location: [facility];  Service: General;;  . LAPAROTOMY  07/20/11  . LASER ABLATION   06/2009   Left Greater Saphenous vein -Dr Donnetta Hutching  . TOE AMPUTATION  06/2009   x3Right foot second toe -Dr Beola Cord  . TONSILLECTOMY    . TRANSURETHRAL RESECTION OF BLADDER TUMOR N/A 07/07/2018   Procedure: TRANSURETHRAL RESECTION OF BLADDER TUMOR (TURBT) WITH POST OP INSTILLATION OF GEMCITABIN;  Surgeon: Ardis Hughs, MD;  Location: [facility];  Service: Urology;  Laterality: N/A;    Current Medications: Outpatient Medications Prior to Visit  Medication Sig Dispense Refill  . aspirin EC 81 MG tablet Take 1 tablet (81 mg total) by mouth daily. 90 tablet 3  . Blood Glucose Monitoring Suppl (ONETOUCH VERIO) w/Device KIT 1 Act by Does not apply route 3 (three) times daily. 1 kit 2  . clindamycin (CLEOCIN) 300 MG capsule Take 1 capsule (300 mg total) by mouth 3 (three) times daily. 21 capsule 0  . docusate sodium (COLACE) 100 MG capsule Take 1 capsule (100 mg total) by mouth 2 (two) times daily. (Patient taking differently: Take 100 mg by mouth 2 (two) times daily as needed (constipation.). ) 10 capsule 0  . Ertugliflozin L-PyroglutamicAc (STEGLATRO) 5 MG TABS Take 1 tablet by mouth daily. 90 tablet 1  . famotidine (PEPCID) 20 MG tablet Take 1 tablet (20 mg total) by mouth 2 (two) times daily. (Patient taking differently: Take 20 mg by mouth 2 (two) times daily as needed (heartburn/indigestion.). ) 10 tablet 0  . glimepiride (AMARYL) 2 MG tablet Take 1 tablet (2 mg total) by mouth daily with breakfast. 90 tablet 0  . glucose blood (TRUE METRIX BLOOD GLUCOSE TEST) test strip Use to test blood sugar twice daily. DX: E11.9 200 each 3  . metFORMIN (GLUCOPHAGE) 500 MG tablet Take 2 tablets (1,000 mg total) by mouth 2 (two) times daily. 360 tablet 0  . simvastatin (ZOCOR) 40 MG tablet Take 1 tablet (40 mg total) by mouth every evening. 90 tablet 1  . Skin Protectants, Misc. (EUCERIN) cream Apply 1 application topically as needed for dry skin (ear itching.).    Marland Kitchen TRUEplus Lancets 33G MISC Use to test blood  sugar twice daily. DX: E11.9 200 each 3  . vitamin C (ASCORBIC ACID) 500 MG tablet Take 1,000 mg by mouth daily.     No facility-administered medications prior to visit.      Allergies:   Bactrim [sulfamethoxazole-trimethoprim]   Social History   Socioeconomic History  . Marital status: Married    Spouse name: Peter Congo  . Number of children: 2  . Years of education: Not on file  . Highest education level: Not on file  Occupational History  . Occupation: retired  Scientific laboratory technician  . Financial resource strain: Hard  . Food insecurity    Worry: Never true    Inability: Never true  . Transportation needs    Medical: No    Non-medical:  Yes  Tobacco Use  . Smoking status: Current Some Day Smoker    Packs/day: 0.10    Years: 5.00    Pack years: 0.50    Types: Cigarettes    Last attempt to quit: 09/05/2015    Years since quitting: 3.6  . Smokeless tobacco: Never Used  . Tobacco comment: smokes a cigarette per day  Substance and Sexual Activity  . Alcohol use: Yes    Alcohol/week: 7.0 standard drinks    Types: 7 Cans of beer per week    Comment: not every week   . Drug use: No  . Sexual activity: Not Currently  Lifestyle  . Physical activity    Days per week: 3 days    Minutes per session: 30 min  . Stress: Rather much  Relationships  . Social connections    Talks on phone: More than three times a week    Gets together: More than three times a week    Attends religious service: More than 4 times per year    Active member of club or organization: Yes    Attends meetings of clubs or organizations: More than 4 times per year    Relationship status: Married  Other Topics Concern  . Not on file  Social History Narrative  . Not on file     Family History:  The patient's family history includes Cancer in his father; Diabetes in his father; Prostate cancer in his father; Stroke in his father.   ROS:   Please see the history of present illness.    ROS All other systems  reviewed and are negative.   PHYSICAL EXAM:   VS:  BP (!) 118/58   Pulse 71   Temp (!) 97.3 F (36.3 C)   Ht '6\' 8"'  (2.032 m)   Wt (!) 307 lb 9.6 oz (139.5 kg)   SpO2 97%   BMI 33.79 kg/m    GEN: Well nourished, well developed, in no acute distress  HEENT: normal  Neck: no JVD, carotid bruits, or masses Cardiac: RRR; no murmurs, rubs, or gallops,no edema  Respiratory:  clear to auscultation bilaterally, normal work of breathing GI: soft, nontender, nondistended, + BS MS: Right foot in boot Skin: warm and dry, no rash Neuro:  Alert and Oriented x 3, Strength and sensation are intact Psych: euthymic mood, full affect  Wt Readings from Last 3 Encounters:  05/14/19 (!) 307 lb 9.6 oz (139.5 kg)  04/18/19 (!) 309 lb (140.2 kg)  04/18/19 (!) 309 lb (140.2 kg)      Studies/Labs Reviewed:   EKG:  EKG is ordered today.  The ekg ordered today demonstrates normal sinus rhythm with first-degree AV block, otherwise no significant ST-T wave changes  Recent Labs: 04/18/2019: ALT 13; BUN 8; Creatinine, Ser 0.95; Hemoglobin 13.3; Platelets 178.0; Potassium 4.1; Sodium 138; TSH 0.85   Lipid Panel    Component Value Date/Time   CHOL 137 04/18/2019 1035   TRIG 80.0 04/18/2019 1035   HDL 52.40 04/18/2019 1035   CHOLHDL 3 04/18/2019 1035   VLDL 16.0 04/18/2019 1035   LDLCALC 68 04/18/2019 1035    Additional studies/ records that were reviewed today include:   Echo 01/14/2016 Study Conclusions  - Left ventricle: The cavity size was normal. There was mild   concentric hypertrophy. Systolic function was normal. Wall motion   was normal; there were no regional wall motion abnormalities. - Aortic valve: Trileaflet; moderately thickened, moderately   calcified leaflets. Valve mobility was restricted.  There was no   regurgitation. - Aortic root: The aortic root was normal in size. - Mitral valve: Moderately thickened, moderately calcified leaflets   . Leaflet separation was moderately  reduced. The findings are   consistent with mild to moderate stenosis. There was mild   regurgitation. Valve area by continuity equation (using LVOT   flow): 1.12 cm^2. - Left atrium: The atrium was moderately dilated. - Right ventricle: The cavity size was normal. Wall thickness was   normal. Systolic function was normal. - Right atrium: The atrium was normal in size. - Tricuspid valve: There was trivial regurgitation. - Pulmonary arteries: Systolic pressure was within the normal   range. - Inferior vena cava: The vessel was normal in size. - Pericardium, extracardiac: There was no pericardial effusion.   ASSESSMENT:    1. Pre-procedural cardiovascular examination   2. Mitral valve stenosis, unspecified etiology   3. Hyperlipidemia, unspecified hyperlipidemia type   4. Controlled type 2 diabetes mellitus with diabetic polyneuropathy, without long-term current use of insulin (HCC)      PLAN:  In order of problems listed above:  1. Preoperative clearance: Although patient cannot perform 4 METS of activity.  He does not have any history of CAD nor does he have EKG changes or anginal symptom.  I discussed the case with DOD Dr. Debara Pickett, this is a low risk patient and may proceed with the low risk surgery.  Given fairly urgent need to proceed with surgery due to infected toe (no PAD issue as ABI normal), additional ischemic work-up will only delay the surgery and is not recommended at this time.  I have forwarded cardiac clearance to Dr. March Rummage via epic.  2. Mitral stenosis: He had mild to moderate mitral stenosis with mild MR in 2017.  On physical exam, I did not appreciate significant heart murmur.  He will need a repeat echocardiogram to monitor his mitral stenosis, however this does not need to be done prior to the surgery.  3. Hyperlipidemia: Continue Zocor 40 mg daily.  4. DM2: Managed by PCP.  According to the patient, he has had a 20-year history of diabetes and has significant  neuropathy.   Medication Adjustments/Labs and Tests Ordered: Current medicines are reviewed at length with the patient today.  Concerns regarding medicines are outlined above.  Medication changes, Labs and Tests ordered today are listed in the Patient Instructions below. Patient Instructions  Medication Instructions:   Your physician recommends that you continue on your current medications as directed. Please refer to the Current Medication list given to you today.  If you need a refill on your cardiac medications before your next appointment, please call your pharmacy.   Lab work:  NONE ordered at this time of appointment   If you have labs (blood work) drawn today and your tests are completely normal, you will receive your results only by: Marland Kitchen MyChart Message (if you have MyChart) OR . A paper copy in the mail If you have any lab test that is abnormal or we need to change your treatment, we will call you to review the results.  Testing/Procedures:  Your physician has requested that you have an Echocardiogram. Echocardiography is a painless test that uses sound waves to create images of your heart. It provides your doctor with information about the size and shape of your heart and how well your heart's chambers and valves are working. This procedure takes approximately one hour. There are no restrictions for this procedure.  To be scheduled  for 6-7 months   Follow-Up: At [facility], you and your health needs are our priority.  As part of our continuing mission to provide you with exceptional heart care, we have created designated Provider Care Teams.  These Care Teams include your primary Cardiologist (physician) and Advanced Practice Providers (APPs -  Physician Assistants and Nurse Practitioners) who all work together to provide you with the care you need, when you need it. You will need a follow up appointment in 6-9 months.  Please call our office 2 months in advance to schedule  this appointment.  You may see Minus Breeding, MD or one of the following Advanced Practice Providers on your designated Care Team:   Rosaria Ferries, PA-C . Jory Sims, DNP, ANP  Any Other Special Instructions Will Be Listed Below (If Applicable).   You are cleared for surgery      Signed, Almyra Deforest, [place]  05/14/2019 1:13 PM    [address], [place], [place]  [address] Phone: [phone]; Fax: [phone]

## 2019-05-14 NOTE — Telephone Encounter (Signed)
[facility]. [facility] clearance [facility] at [facility] on 6/30 and surgery on 7/2. He is to have COVID-19 test at 1:10pm today and quarantine thereafter. Informed her that can put pt in for appt slot at 11:15 am today with Almyra Deforest, PA-C (who he was to see tomorrow) so that pt can have surgical clearance appt today with COVID-19 test later today. Appt changed to 6/29 at 11:15am with Almyra Deforest, PA-C.  Called pt at [phone]. Pt already aware of situation. Informed him of his appt change. Pt agreeable with this and will report to [place] office today for 11:15am appt and then will present to [facility] covered drive-thru for [id number] test at 1:10pm.

## 2019-05-14 NOTE — Telephone Encounter (Signed)
"  I'm calling because I'm schedule to have my Covid test done today.  I'm scheduled to see my heart doctor tomorrow.  I don't know what to do because when I have the Covid test done I'm supposed to isolate myself for three days.  How am I supposed to do that if I have that appointment about my heart on tomorrow?  My surgery is scheduled for Thursday."  I'll see what Dr. March Rummage recommends and give you a call back.  Dr. March Rummage stated, "I don't believe it would be contrary to the isolation but I would see if he can wait in his car until they are ready for him so he doesn't wait in the waiting room."  I called Dr. Rosezella [place] office and spoke to [place].  I informed her of the situation.  Chima told me she would schedule him an appointment today with Almyra Deforest, PA, at 11:15 am.  I called and informed the patient.

## 2019-05-14 NOTE — Patient Instructions (Addendum)
Medication Instructions:   Your physician recommends that you continue on your current medications as directed. Please refer to the Current Medication list given to you today.  If you need a refill on your cardiac medications before your next appointment, please call your pharmacy.   Lab work:  NONE ordered at this time of appointment   If you have labs (blood work) drawn today and your tests are completely normal, you will receive your results only by: Marland Kitchen MyChart Message (if you have MyChart) OR . A paper copy in the mail If you have any lab test that is abnormal or we need to change your treatment, we will call you to review the results.  Testing/Procedures:  Your physician has requested that you have an Echocardiogram. Echocardiography is a painless test that uses sound waves to create images of your heart. It provides your doctor with information about the size and shape of your heart and how well your heart's chambers and valves are working. This procedure takes approximately one hour. There are no restrictions for this procedure.  To be scheduled for 6-7 months   Follow-Up: At [facility], you and your health needs are our priority.  As part of our continuing mission to provide you with exceptional heart care, we have created designated Provider Care Teams.  These Care Teams include your primary Cardiologist (physician) and Advanced Practice Providers (APPs -  Physician Assistants and Nurse Practitioners) who all work together to provide you with the care you need, when you need it. You will need a follow up appointment in 6-9 months.  Please call our office 2 months in advance to schedule this appointment.  You may see Minus Breeding, MD or one of the following Advanced Practice Providers on your designated Care Team:   Rosaria Ferries, PA-C . Jory Sims, DNP, ANP  Any Other Special Instructions Will Be Listed Below (If Applicable).   You are cleared for surgery

## 2019-05-15 ENCOUNTER — Ambulatory Visit: Payer: HMO | Admitting: Physician Assistant

## 2019-05-15 ENCOUNTER — Ambulatory Visit: Payer: Self-pay

## 2019-05-15 ENCOUNTER — Telehealth: Payer: Self-pay | Admitting: *Deleted

## 2019-05-15 NOTE — Anesthesia Preprocedure Evaluation (Deleted)
Anesthesia Evaluation  Patient identified by MRN, date of birth, ID band Patient awake    Reviewed: Allergy & Precautions, NPO status , Patient's Chart, lab work & pertinent test results  History of Anesthesia Complications (+) PONV  Airway Mallampati: II  TM Distance: >3 FB Neck ROM: Full    Dental no notable dental hx.    Pulmonary asthma , sleep apnea , COPD, Current Smoker, former smoker,    Pulmonary exam normal breath sounds clear to auscultation       Cardiovascular + Peripheral Vascular Disease  Normal cardiovascular exam Rhythm:Regular Rate:Normal     Neuro/Psych  Headaches, Anxiety Depression negative psych ROS   GI/Hepatic Neg liver ROS, GERD  ,  Endo/Other  negative endocrine ROSdiabetes, Type 2  Renal/GU negative Renal ROS  negative genitourinary   Musculoskeletal  (+) Arthritis ,   Abdominal   Peds negative pediatric ROS (+)  Hematology negative hematology ROS (+)   Anesthesia Other Findings   Reproductive/Obstetrics negative OB ROS                            Anesthesia Physical Anesthesia Plan  ASA: III  Anesthesia Plan: MAC   Post-op Pain Management:    Induction: Intravenous  PONV Risk Score and Plan: 2 and Ondansetron, Midazolam and Treatment may vary due to age or medical condition  Airway Management Planned: Simple Face Mask  Additional Equipment:   Intra-op Plan:   Post-operative Plan:   Informed Consent: I have reviewed the patients History and Physical, chart, labs and discussed the procedure including the risks, benefits and alternatives for the proposed anesthesia with the patient or authorized representative who has indicated his/her understanding and acceptance.     Dental advisory given  Plan Discussed with: CRNA  Anesthesia Plan Comments: (PAT note written 05/15/2019 by Myra Gianotti, PA-C. )       Anesthesia Quick Evaluation                                   Anesthesia Evaluation  Patient identified by MRN, date of birth, ID band Patient awake    Reviewed: Allergy & Precautions, NPO status , Patient's Chart, lab work & pertinent test results  Airway Mallampati: II  TM Distance: >3 FB Neck ROM: Full    Dental no notable dental hx. (+) Poor Dentition, Missing   Pulmonary asthma , former smoker,    Pulmonary exam normal breath sounds clear to auscultation       Cardiovascular negative cardio ROS Normal cardiovascular exam Rhythm:Regular Rate:Normal     Neuro/Psych negative neurological ROS  negative psych ROS   GI/Hepatic negative GI ROS, Neg liver ROS, GERD  Poorly Controlled,  Endo/Other  negative endocrine ROSdiabetes  Renal/GU negative Renal ROS  negative genitourinary   Musculoskeletal negative musculoskeletal ROS (+)   Abdominal   Peds negative pediatric ROS (+)  Hematology negative hematology ROS (+)   Anesthesia Other Findings   Reproductive/Obstetrics negative OB ROS                             Anesthesia Physical Anesthesia Plan  ASA: II  Anesthesia Plan: General   Post-op Pain Management:    Induction: Intravenous  PONV Risk Score and Plan: 2 and Ondansetron and Treatment may vary due to age or medical condition  Airway Management  Planned: Oral ETT  Additional Equipment:   Intra-op Plan:   Post-operative Plan: Extubation in OR  Informed Consent: I have reviewed the patients History and Physical, chart, labs and discussed the procedure including the risks, benefits and alternatives for the proposed anesthesia with the patient or authorized representative who has indicated his/her understanding and acceptance.   Dental advisory given  Plan Discussed with: CRNA  Anesthesia Plan Comments:         Anesthesia Quick Evaluation                                   Anesthesia Evaluation  Patient identified by MRN, date of  birth, ID band Patient awake    Reviewed: Allergy & Precautions, NPO status , Patient's Chart, lab work & pertinent test results  Airway Mallampati: II  TM Distance: >3 FB Neck ROM: Full    Dental no notable dental hx. (+) Poor Dentition, Missing   Pulmonary asthma , former smoker,    Pulmonary exam normal breath sounds clear to auscultation       Cardiovascular negative cardio ROS Normal cardiovascular exam Rhythm:Regular Rate:Normal     Neuro/Psych negative neurological ROS  negative psych ROS   GI/Hepatic negative GI ROS, Neg liver ROS, GERD  Poorly Controlled,  Endo/Other  negative endocrine ROSdiabetes  Renal/GU negative Renal ROS  negative genitourinary   Musculoskeletal negative musculoskeletal ROS (+)   Abdominal   Peds negative pediatric ROS (+)  Hematology negative hematology ROS (+)   Anesthesia Other Findings   Reproductive/Obstetrics negative OB ROS                             Anesthesia Physical Anesthesia Plan  ASA: II  Anesthesia Plan: General   Post-op Pain Management:    Induction: Intravenous  PONV Risk Score and Plan: 2 and Ondansetron and Treatment may vary due to age or medical condition  Airway Management Planned: Oral ETT  Additional Equipment:   Intra-op Plan:   Post-operative Plan: Extubation in OR  Informed Consent: I have reviewed the patients History and Physical, chart, labs and discussed the procedure including the risks, benefits and alternatives for the proposed anesthesia with the patient or authorized representative who has indicated his/her understanding and acceptance.   Dental advisory given  Plan Discussed with: CRNA  Anesthesia Plan Comments:         Anesthesia Quick Evaluation

## 2019-05-15 NOTE — Telephone Encounter (Signed)
I am returning your call.  You're scheduled for Thursday morning at 7:30 am.  You'll probably have to be there a couple of hours before.  Your wife does need to take you, you will not be able to drive.  You will get a call from the pre-op nurse probably on tomorrow.  "Okay, thank you."

## 2019-05-15 NOTE — Progress Notes (Signed)
Anesthesia Chart Review:  Case: [id number] Date/Time: 05/17/19 0715   Procedures:      AMPUTATION TOE MPJ  FOURTH AND FIFTH (Right )     METATARSLSECTOMY FOURTH DIGIT (Right )     ACHILLES LENGTHENING/KIDNER (Right )   Anesthesia type: Monitor Anesthesia Care   Pre-op diagnosis: ULCER, BONE EROSION,OSTEOMYLITIS,   Location: [facility] OR ROOM 12 / [place] [facility]   Surgeon: Evelina Bucy, DPM      DISCUSSION: Patient is a 78 year old male scheduled for the above procedure. Surgery was initially scheduled for 05/10/19, but was postponed until he could be re-evaluated by cardiology given history of mitral stenosis.  Since then patient was seen at [facility] be Almyra Deforest, [place] on 05/14/19. Patient was asymptomatic, but could not perform 4 METS of activity at that time. There was no edema or murmur on exam. Almyra Deforest discussed with covering cardiologist Lyman Bishop, MD and "given the worsening infection of his toe, we felt patient should proceed with the surgery without further cardiac work-up." A future echo ordered, but did not need to be done prior to surgery.       History includes smoking, DM2, venous insufficiency, GERD, IBS, BPH, hidradenitis, MVP (involving anterior leaflet 08/2010) with mild-moderate MS (01/2016), OSA, asthma, glaucoma, HLD, memory loss, toe amputations (right great, 3rd toe 2013), bladder tumor (s/p TURBT 07/07/18).   PCP [name], MD recently evaluated patient on 04/18/19 for annual exam. He is aware of surgery plans and did patient's H&P.    Last A1c 9.0. He will get labs including a fasting CBG on arrival.   Negative presurgical COVID test on 05/14/19.    PROVIDERS: [name], MD is PCP [facility]). Minus Breeding, MD is cardiologist.    LABS: He is for updated labs on arrival. As of 04/18/19, Cr 0.95, H/H 13.3/39.2, PLT 178, A1c 9.0.    OTHER: Spirometry 01/01/16: FVC 3.8 (74%), FEV1 2.8 (75%0, FEV1/FVC 76% (103%), Mild restriction.    EKG:   - EKG 05/14/19 ([facility]): NSR with first degree AV block. LVH. No significant ST/T wave changes.  - EKG 06/14/18: Sinus rhythm with 1st degree A-V block with Premature atrial complexes No significant change since last tracing 11/19/15 Confirmed by Minus Breeding [phone]) on 06/14/2018 12:30:15 PM   CV: Echo 01/14/16: Study Conclusions - Left ventricle: The cavity size was normal. There was mild concentric hypertrophy. Systolic function was normal. Wall motion was normal; there were no regional wall motion abnormalities. - Aortic valve: Trileaflet; moderately thickened, moderately calcified leaflets. Valve mobility was restricted. There was no regurgitation. - Aortic root: The aortic root was normal in size. - Mitral valve: Moderately thickened, moderately calcified leaflets.    Leaflet separation was moderately reduced.  Mobility was not restricted.    The findings areconsistent with mild to moderate stenosis. There was    Mild regurgitation.  Valve area by pressure half-time: 3.73 cm^2.  Indexed valve area by pressure half-time: 1.31 cm^2/m^2.  Valve area by continuity equation (using LVOT flow): 1.12 cm^2.  Indexed valve area by continuity equation (using LVOT flow): 0.39 cm^2/m^2.  Mean gradient (D): 6 mm Hg. Peak gradient (D): 5 mm Hg. - Left atrium: The atrium was moderately dilated. - Right ventricle: The cavity size was normal. Wall thickness was normal. Systolic function was normal. - Right atrium: The atrium was normal in size. - Tricuspid valve: There was trivial regurgitation. - Pulmonary arteries: Systolic pressure was within the normal range. - Inferior  vena cava: The vessel was normal in size. - Pericardium, extracardiac: There was no pericardial effusion.   Past Medical History:  Diagnosis Date  . Anxiety   . Asthma    in past, no inhaler now  . Benign neoplasm of colon   . Depression   . Diverticulosis of colon (without mention of  hemorrhage)   . DJD (degenerative joint disease)   . Esophageal reflux   . Glaucoma   . Hidradenitis   . History of BPH   . History of gynecomastia    Unilateral  . Hyperlipidemia   . Irritable bowel syndrome   . Memory loss   . Mitral stenosis    mild-moderate MS 01/2016  . MVP (mitral valve prolapse)   . Obesity, unspecified   . Sleep apnea    does not use  cpap or bipap .. no study  ever  . Type II or unspecified type diabetes mellitus without mention of complication, not stated as uncontrolled   . Unspecified venous (peripheral) insufficiency     Past Surgical History:  Procedure Laterality Date  . AMPUTATION  08/09/2012   Procedure: AMPUTATION DIGIT;  Surgeon: Newt Minion, MD;  Location: [place];  Service: Orthopedics;  Laterality: Right;  Amputation right Great Toe MTP Joint  . AMPUTATION  10/17/2012   Procedure: AMPUTATION DIGIT;  Surgeon: Newt Minion, MD;  Location: [place];  Service: Orthopedics;  Laterality: Right;  Right Foot 3rd Toe Amputation at Metatarsophalangeal Joint  . CHOLECYSTECTOMY N/A 06/14/2018   Procedure: LAPAROSCOPIC CHOLECYSTECTOMY WITH INTRAOPERATIVE CHOLANGIOGRAM;  Surgeon: Kieth Brightly Arta Bruce, MD;  Location: [facility];  Service: General;  Laterality: N/A;  . CIRCUMCISION  06/2100   For Phimosis - Dr Hartley Barefoot  . COLON SURGERY    . CYSTOSCOPY W/ RETROGRADES Bilateral 07/07/2018   Procedure: CYSTOSCOPY WITH BILATERAL RETROGRADE PYELOGRAM;  Surgeon: Ardis Hughs, MD;  Location: [facility];  Service: Urology;  Laterality: Bilateral;  . KNEE ARTHROSCOPY    . LAPAROSCOPIC LYSIS OF ADHESIONS  06/14/2018   Procedure: LAPAROSCOPIC LYSIS OF ADHESIONS;  Surgeon: Kieth Brightly Arta Bruce, MD;  Location: [facility];  Service: General;;  . LAPAROTOMY  07/20/11  . LASER ABLATION  06/2009   Left Greater Saphenous vein -Dr Donnetta Hutching  . TOE AMPUTATION  06/2009   x3Right foot second toe -Dr Beola Cord  . TONSILLECTOMY    . TRANSURETHRAL RESECTION OF BLADDER TUMOR N/A 07/07/2018    Procedure: TRANSURETHRAL RESECTION OF BLADDER TUMOR (TURBT) WITH POST OP INSTILLATION OF GEMCITABIN;  Surgeon: Ardis Hughs, MD;  Location: [facility];  Service: Urology;  Laterality: N/A;    MEDICATIONS: No current facility-administered medications for this encounter.    Marland Kitchen aspirin EC 81 MG tablet  . Blood Glucose Monitoring Suppl (ONETOUCH VERIO) w/Device KIT  . clindamycin (CLEOCIN) 300 MG capsule  . docusate sodium (COLACE) 100 MG capsule  . Ertugliflozin L-PyroglutamicAc (STEGLATRO) 5 MG TABS  . famotidine (PEPCID) 20 MG tablet  . glimepiride (AMARYL) 2 MG tablet  . glucose blood (TRUE METRIX BLOOD GLUCOSE TEST) test strip  . metFORMIN (GLUCOPHAGE) 500 MG tablet  . simvastatin (ZOCOR) 40 MG tablet  . Skin Protectants, Misc. (EUCERIN) cream  . TRUEplus Lancets 33G MISC  . vitamin C (ASCORBIC ACID) 500 MG tablet    Myra Gianotti, PA-C Surgical Short Stay/Anesthesiology [facility] Phone [phone] [facility] Phone [phone] 05/15/2019 3:01 PM

## 2019-05-15 NOTE — Telephone Encounter (Addendum)
"  I'm due to have surgery on Thursday.  I'm checking to see what time.  Is it the same time as before.  I don't know where I'm to go.  Does my wife bring me because we're supposed to be socially distancing as well as everybody else.  If someone would get in touch with me I'd appreciate it.  I attempted to call him back .  I left him a message to call me back.

## 2019-05-16 ENCOUNTER — Other Ambulatory Visit: Payer: Self-pay

## 2019-05-16 ENCOUNTER — Encounter (HOSPITAL_COMMUNITY): Payer: Self-pay | Admitting: *Deleted

## 2019-05-16 NOTE — Progress Notes (Signed)
Pt denies SOB and chest pain. Pt stated that he is under the care of Dr. Percival Spanish, Cardiology. Pt stated that a stress test was performed > 10 years ago. Pt denies having a cardiac cath. Pt denies having a chest x ray within the last year. Pt denies recent labs.  Pt made aware to stop taking vitamins, fish oil and herbal medications. Do not take any NSAIDs ie: Ibuprofen, Advil, Naproxen (Aleve), Motrin, BC and Goody Powder. Pt stated that he would call surgeon's office for pre-op Aspirin instructions.  Pt made aware to not take Glimepiride and Metformin on DOS.Pt made aware to check BG every 2 hours prior to arrival to hospital on DOS. Pt made aware to treat a BG < 70 with  4 ounces of apple or cranberry juice, wait 15 minutes after intervention to recheck BG, if BG remains < 70, call Short Stay unit to speak with a nurse. Pt denies that he and family members tested positive for COVID-19 ( pt tested on 05/14/19 and reminded to quarantine).  Pt denies that he and family members experienced the following symptoms:  Cough yes/no: No Fever (>100.11F)  yes/no: No Runny nose yes/no: No Sore throat yes/no: No Difficulty breathing/shortness of breath  yes/no: No  Have you or a family member traveled in the last 14 days and where? yes/no: No  Pt reminded that hospital visitation restrictions are in effect and the importance of the restrictions.    Pt verbalized understanding of all pre-op instructions.  See PA, Anesthesiology, note.

## 2019-05-17 ENCOUNTER — Ambulatory Visit (HOSPITAL_COMMUNITY): Payer: HMO

## 2019-05-17 ENCOUNTER — Encounter (HOSPITAL_COMMUNITY): Payer: Self-pay

## 2019-05-17 ENCOUNTER — Other Ambulatory Visit: Payer: HMO

## 2019-05-17 ENCOUNTER — Encounter (HOSPITAL_COMMUNITY)
Admission: RE | Disposition: A | Discharge status: Home / Self Care | Payer: Self-pay | Source: Home / Self Care | Attending: Podiatry

## 2019-05-17 ENCOUNTER — Ambulatory Visit (HOSPITAL_COMMUNITY)
Admission: RE | Admit: 2019-05-17 | Discharge: 2019-05-17 | Disposition: A | Discharge status: Home / Self Care | Payer: HMO | Attending: Podiatry | Admitting: Podiatry

## 2019-05-17 ENCOUNTER — Ambulatory Visit (HOSPITAL_COMMUNITY): Payer: HMO | Admitting: Vascular Surgery

## 2019-05-17 ENCOUNTER — Telehealth: Payer: Self-pay | Admitting: Podiatry

## 2019-05-17 DIAGNOSIS — Z89411 Acquired absence of right great toe: Secondary | ICD-10-CM | POA: Diagnosis not present

## 2019-05-17 DIAGNOSIS — K219 Gastro-esophageal reflux disease without esophagitis: Secondary | ICD-10-CM | POA: Diagnosis not present

## 2019-05-17 DIAGNOSIS — G4733 Obstructive sleep apnea (adult) (pediatric): Secondary | ICD-10-CM | POA: Insufficient documentation

## 2019-05-17 DIAGNOSIS — Z79899 Other long term (current) drug therapy: Secondary | ICD-10-CM | POA: Insufficient documentation

## 2019-05-17 DIAGNOSIS — E785 Hyperlipidemia, unspecified: Secondary | ICD-10-CM | POA: Diagnosis not present

## 2019-05-17 DIAGNOSIS — M869 Osteomyelitis, unspecified: Secondary | ICD-10-CM | POA: Diagnosis not present

## 2019-05-17 DIAGNOSIS — L97514 Non-pressure chronic ulcer of other part of right foot with necrosis of bone: Secondary | ICD-10-CM | POA: Insufficient documentation

## 2019-05-17 DIAGNOSIS — Z7982 Long term (current) use of aspirin: Secondary | ICD-10-CM | POA: Diagnosis not present

## 2019-05-17 DIAGNOSIS — E11621 Type 2 diabetes mellitus with foot ulcer: Secondary | ICD-10-CM | POA: Diagnosis not present

## 2019-05-17 DIAGNOSIS — I96 Gangrene, not elsewhere classified: Secondary | ICD-10-CM | POA: Diagnosis not present

## 2019-05-17 DIAGNOSIS — E1151 Type 2 diabetes mellitus with diabetic peripheral angiopathy without gangrene: Secondary | ICD-10-CM | POA: Diagnosis not present

## 2019-05-17 DIAGNOSIS — J449 Chronic obstructive pulmonary disease, unspecified: Secondary | ICD-10-CM | POA: Diagnosis not present

## 2019-05-17 DIAGNOSIS — Z9889 Other specified postprocedural states: Secondary | ICD-10-CM

## 2019-05-17 DIAGNOSIS — M86171 Other acute osteomyelitis, right ankle and foot: Secondary | ICD-10-CM

## 2019-05-17 DIAGNOSIS — F17210 Nicotine dependence, cigarettes, uncomplicated: Secondary | ICD-10-CM | POA: Diagnosis not present

## 2019-05-17 DIAGNOSIS — L97519 Non-pressure chronic ulcer of other part of right foot with unspecified severity: Secondary | ICD-10-CM | POA: Diagnosis not present

## 2019-05-17 DIAGNOSIS — M216X1 Other acquired deformities of right foot: Secondary | ICD-10-CM

## 2019-05-17 DIAGNOSIS — Z7984 Long term (current) use of oral hypoglycemic drugs: Secondary | ICD-10-CM | POA: Diagnosis not present

## 2019-05-17 DIAGNOSIS — E1169 Type 2 diabetes mellitus with other specified complication: Secondary | ICD-10-CM | POA: Diagnosis not present

## 2019-05-17 DIAGNOSIS — Z89421 Acquired absence of other right toe(s): Secondary | ICD-10-CM | POA: Insufficient documentation

## 2019-05-17 DIAGNOSIS — M7661 Achilles tendinitis, right leg: Secondary | ICD-10-CM | POA: Diagnosis not present

## 2019-05-17 HISTORY — DX: Pneumonia, unspecified organism: J18.9

## 2019-05-17 HISTORY — DX: Headache, unspecified: R51.9

## 2019-05-17 HISTORY — DX: Osteomyelitis, unspecified: M86.9

## 2019-05-17 HISTORY — PX: ACHILLES TENDON SURGERY: SHX542

## 2019-05-17 HISTORY — DX: Malignant (primary) neoplasm, unspecified: C80.1

## 2019-05-17 HISTORY — PX: AMPUTATION TOE: SHX6595

## 2019-05-17 HISTORY — PX: METATARSAL OSTEOTOMY: SHX1641

## 2019-05-17 LAB — CBC
HCT: 35.4 % — ABNORMAL LOW (ref 39.0–52.0)
Hemoglobin: 11.5 g/dL — ABNORMAL LOW (ref 13.0–17.0)
MCH: 31.4 pg (ref 26.0–34.0)
MCHC: 32.5 g/dL (ref 30.0–36.0)
MCV: 96.7 fL (ref 80.0–100.0)
Platelets: 143 10*3/uL — ABNORMAL LOW (ref 150–400)
RBC: 3.66 MIL/uL — ABNORMAL LOW (ref 4.22–5.81)
RDW: 11.7 % (ref 11.5–15.5)
WBC: 4.6 10*3/uL (ref 4.0–10.5)
nRBC: 0 % (ref 0.0–0.2)

## 2019-05-17 LAB — COMPREHENSIVE METABOLIC PANEL
ALT: 18 U/L (ref 0–44)
AST: 19 U/L (ref 15–41)
Albumin: 3.6 g/dL (ref 3.5–5.0)
Alkaline Phosphatase: 32 U/L — ABNORMAL LOW (ref 38–126)
Anion gap: 10 (ref 5–15)
BUN: 17 mg/dL (ref 8–23)
CO2: 22 mmol/L (ref 22–32)
Calcium: 9.2 mg/dL (ref 8.9–10.3)
Chloride: 106 mmol/L (ref 98–111)
Creatinine, Ser: 0.94 mg/dL (ref 0.61–1.24)
GFR calc Af Amer: >60 mL/min (ref >60)
GFR calc non Af Amer: >60 mL/min (ref >60)
Glucose, Bld: 142 mg/dL — ABNORMAL HIGH (ref 70–99)
Potassium: 3.5 mmol/L (ref 3.5–5.1)
Sodium: 138 mmol/L (ref 135–145)
Total Bilirubin: 1.5 mg/dL — ABNORMAL HIGH (ref 0.3–1.2)
Total Protein: 6.5 g/dL (ref 6.5–8.1)

## 2019-05-17 LAB — GLUCOSE, CAPILLARY
Glucose-Capillary: 135 mg/dL — ABNORMAL HIGH (ref 70–99)
Glucose-Capillary: 126 mg/dL — ABNORMAL HIGH (ref 70–99)

## 2019-05-17 SURGERY — AMPUTATION, TOE
Anesthesia: Monitor Anesthesia Care | Site: Foot | Laterality: Right

## 2019-05-17 MED ORDER — BUPIVACAINE HCL (PF) 0.5 % IJ SOLN
INTRAMUSCULAR | Status: AC
  Filled 2019-05-17: qty 30

## 2019-05-17 MED ORDER — VANCOMYCIN HCL 1 G IV SOLR
INTRAVENOUS | Status: DC | PRN
  Administered 2019-05-17: 1000 mg

## 2019-05-17 MED ORDER — FENTANYL CITRATE (PF) 250 MCG/5ML IJ SOLN
INTRAMUSCULAR | Status: AC
  Filled 2019-05-17: qty 5

## 2019-05-17 MED ORDER — PROPOFOL 500 MG/50ML IV EMUL
INTRAVENOUS | Status: DC | PRN
  Administered 2019-05-17: 80 ug/kg/min via INTRAVENOUS

## 2019-05-17 MED ORDER — OXYCODONE HCL 5 MG PO TABS
ORAL_TABLET | ORAL | Status: AC
  Filled 2019-05-17: qty 1

## 2019-05-17 MED ORDER — OXYCODONE HCL 5 MG PO TABS
5.0000 mg | ORAL_TABLET | Freq: Once | ORAL | Status: AC | PRN
  Administered 2019-05-17: 5 mg via ORAL

## 2019-05-17 MED ORDER — LIDOCAINE 2% (20 MG/ML) 5 ML SYRINGE
INTRAMUSCULAR | Status: DC | PRN
  Administered 2019-05-17: 100 mg via INTRAVENOUS

## 2019-05-17 MED ORDER — CEFAZOLIN SODIUM-DEXTROSE 2-4 GM/100ML-% IV SOLN
INTRAVENOUS | Status: AC
  Filled 2019-05-17: qty 100

## 2019-05-17 MED ORDER — VANCOMYCIN HCL 1000 MG IV SOLR
INTRAVENOUS | Status: AC
  Filled 2019-05-17: qty 1000

## 2019-05-17 MED ORDER — PROPOFOL 1000 MG/100ML IV EMUL
INTRAVENOUS | Status: AC
  Filled 2019-05-17: qty 100

## 2019-05-17 MED ORDER — PROPOFOL 10 MG/ML IV BOLUS
INTRAVENOUS | Status: DC | PRN
  Administered 2019-05-17: 50 mg via INTRAVENOUS
  Administered 2019-05-17: 20 mg via INTRAVENOUS

## 2019-05-17 MED ORDER — ONDANSETRON HCL 4 MG/2ML IJ SOLN
INTRAMUSCULAR | Status: AC
  Filled 2019-05-17: qty 2

## 2019-05-17 MED ORDER — CLINDAMYCIN HCL 300 MG PO CAPS: 300.0000 mg | ORAL_CAPSULE | Freq: Three times a day (TID) | ORAL | 0 refills | Status: DC

## 2019-05-17 MED ORDER — OXYCODONE HCL 5 MG/5ML PO SOLN: 5.0000 mg | Freq: Once | ORAL | Status: AC | PRN

## 2019-05-17 MED ORDER — DEXTROSE 5 % IV SOLN
INTRAVENOUS | Status: DC | PRN
  Administered 2019-05-17: 08:00:00 3 g via INTRAVENOUS

## 2019-05-17 MED ORDER — BUPIVACAINE HCL 0.5 % IJ SOLN
INTRAMUSCULAR | Status: DC | PRN
  Administered 2019-05-17: 15 mL

## 2019-05-17 MED ORDER — PHENYLEPHRINE HCL-NACL 10-0.9 MG/250ML-% IV SOLN
INTRAVENOUS | Status: AC
  Filled 2019-05-17: qty 500

## 2019-05-17 MED ORDER — ONDANSETRON HCL 4 MG/2ML IJ SOLN
INTRAMUSCULAR | Status: DC | PRN
  Administered 2019-05-17: 4 mg via INTRAVENOUS

## 2019-05-17 MED ORDER — HYDROMORPHONE HCL 1 MG/ML IJ SOLN: 0.2500 mg | INTRAMUSCULAR | Status: DC | PRN

## 2019-05-17 MED ORDER — PROMETHAZINE HCL 25 MG/ML IJ SOLN: 6.2500 mg | INTRAMUSCULAR | Status: DC | PRN

## 2019-05-17 MED ORDER — ESMOLOL HCL 100 MG/10ML IV SOLN
INTRAVENOUS | Status: AC
  Filled 2019-05-17: qty 20

## 2019-05-17 MED ORDER — LACTATED RINGERS IV SOLN
INTRAVENOUS | Status: DC | PRN
  Administered 2019-05-17: 07:00:00 via INTRAVENOUS

## 2019-05-17 MED ORDER — OXYCODONE-ACETAMINOPHEN 5-325 MG PO TABS: 1.0000 | ORAL_TABLET | ORAL | 0 refills | Status: DC | PRN

## 2019-05-17 MED ORDER — 0.9 % SODIUM CHLORIDE (POUR BTL) OPTIME
TOPICAL | Status: DC | PRN
  Administered 2019-05-17: 1000 mL

## 2019-05-17 SURGICAL SUPPLY — 49 items
BANDAGE ACE 4X5 VEL STRL LF (GAUZE/BANDAGES/DRESSINGS) ×3 IMPLANT
BANDAGE ELASTIC 4 VELCRO ST LF (GAUZE/BANDAGES/DRESSINGS) ×1 IMPLANT
BANDAGE ELASTIC 6 VELCRO ST LF (GAUZE/BANDAGES/DRESSINGS) ×2 IMPLANT
BLADE AVERAGE 25MMX9MM (BLADE) ×1
BLADE AVERAGE 25X9 (BLADE) ×1 IMPLANT
BNDG GAUZE ELAST 4 BULKY (GAUZE/BANDAGES/DRESSINGS) ×5 IMPLANT
COLLAGEN CELLERATERX 5 GRAM (Miscellaneous) ×2 IMPLANT
CONT SPEC 4OZ CLIKSEAL STRL BL (MISCELLANEOUS) ×4 IMPLANT
COVER SURGICAL LIGHT HANDLE (MISCELLANEOUS) ×1 IMPLANT
COVER WAND RF STERILE (DRAPES) ×3 IMPLANT
CUFF TOURN SGL QUICK 24 (TOURNIQUET CUFF) ×2
CUFF TRNQT CYL 24X4X16.5-23 (TOURNIQUET CUFF) IMPLANT
DRSG EMULSION OIL 3X3 NADH (GAUZE/BANDAGES/DRESSINGS) ×3 IMPLANT
ELECT CAUTERY BLADE 6.4 (BLADE) ×3 IMPLANT
ELECT REM PT RETURN 9FT ADLT (ELECTROSURGICAL) ×3
ELECTRODE REM PT RTRN 9FT ADLT (ELECTROSURGICAL) ×1 IMPLANT
GAUZE SPONGE 4X4 12PLY STRL (GAUZE/BANDAGES/DRESSINGS) ×3 IMPLANT
GAUZE SPONGE 4X4 12PLY STRL LF (GAUZE/BANDAGES/DRESSINGS) ×2 IMPLANT
GAUZE XEROFORM 5X9 LF (GAUZE/BANDAGES/DRESSINGS) ×2 IMPLANT
GLOVE BIO SURGEON STRL SZ 6.5 (GLOVE) ×1 IMPLANT
GLOVE BIO SURGEON STRL SZ7.5 (GLOVE) ×3 IMPLANT
GLOVE BIO SURGEONS STRL SZ 6.5 (GLOVE) ×1
GLOVE BIOGEL PI IND STRL 6.5 (GLOVE) IMPLANT
GLOVE BIOGEL PI IND STRL 7.0 (GLOVE) IMPLANT
GLOVE BIOGEL PI IND STRL 8 (GLOVE) ×1 IMPLANT
GLOVE BIOGEL PI INDICATOR 6.5 (GLOVE) ×2
GLOVE BIOGEL PI INDICATOR 7.0 (GLOVE) ×6
GLOVE BIOGEL PI INDICATOR 8 (GLOVE) ×2
GOWN STRL REUS W/ TWL LRG LVL3 (GOWN DISPOSABLE) ×1 IMPLANT
GOWN STRL REUS W/ TWL XL LVL3 (GOWN DISPOSABLE) ×1 IMPLANT
GOWN STRL REUS W/TWL LRG LVL3 (GOWN DISPOSABLE) ×2
GOWN STRL REUS W/TWL XL LVL3 (GOWN DISPOSABLE) ×2
KIT BASIN OR (CUSTOM PROCEDURE TRAY) ×3 IMPLANT
KIT TURNOVER KIT B (KITS) ×3 IMPLANT
NDL HYPO 25GX1X1/2 BEV (NEEDLE) ×1 IMPLANT
NEEDLE HYPO 25GX1X1/2 BEV (NEEDLE) ×6 IMPLANT
NS IRRIG 1000ML POUR BTL (IV SOLUTION) ×3 IMPLANT
PACK ORTHO EXTREMITY (CUSTOM PROCEDURE TRAY) ×3 IMPLANT
PAD ARMBOARD 7.5X6 YLW CONV (MISCELLANEOUS) ×3 IMPLANT
SOL PREP POV-IOD 4OZ 10% (MISCELLANEOUS) ×3 IMPLANT
SPONGE LAP 18X18 RF (DISPOSABLE) ×2 IMPLANT
SUT ETHILON 3 0 FSL (SUTURE) ×2 IMPLANT
SUT ETHILON 3 0 PS 1 (SUTURE) ×3 IMPLANT
SUT VIC AB 2-0 CT3 27 (SUTURE) ×2 IMPLANT
SYR CONTROL 10ML LL (SYRINGE) ×3 IMPLANT
TOWEL GREEN STERILE (TOWEL DISPOSABLE) ×3 IMPLANT
TUBE CONNECTING 12'X1/4 (SUCTIONS) ×1
TUBE CONNECTING 12X1/4 (SUCTIONS) ×2 IMPLANT
YANKAUER SUCT BULB TIP NO VENT (SUCTIONS) ×3 IMPLANT

## 2019-05-17 NOTE — Telephone Encounter (Signed)
Attempted to call patient's wife at provided number to provide post-op update. No answer.

## 2019-05-17 NOTE — Transfer of Care (Signed)
Immediate Anesthesia Transfer of Care Note  Patient: Jason [name]  Procedure(s) Performed: AMPUTATION TOE Metatarsal Phalangeal Joint   FOURTH AND FIFTH, FILLETED TOE FLAP (Right Foot) METATARSLSECTOMY FOURTH DIGIT (Right Foot) ACHILLES LENGTHENING/KIDNER (Right Foot)  Patient Location: PACU  Anesthesia Type:MAC  Level of Consciousness: awake  Airway & Oxygen Therapy: Patient Spontanous Breathing  Post-op Assessment: Report given to RN and Post -op Vital signs reviewed and stable  Post vital signs: Reviewed and stable  Last Vitals:  Vitals Value Taken Time  BP 129/67 05/17/19 0847  Temp    Pulse 81 05/17/19 0847  Resp 28 05/17/19 0847  SpO2 99 % 05/17/19 0847  Vitals shown include unvalidated device data.  Last Pain:  Vitals:   05/17/19 0614  TempSrc: Oral  PainSc: 3       Patients Stated Pain Goal: 3 (85/88/50 2774)  Complications: No apparent anesthesia complications

## 2019-05-17 NOTE — Interval H&P Note (Signed)
History and Physical Interval Note:  05/17/2019 7:10 AM  Jason [name]  has presented today for surgery, with the diagnosis of ULCER, BONE EROSION,OSTEOMYLITIS,.  The various methods of treatment have been discussed with the patient and family. After consideration of risks, benefits and other options for treatment, the patient has consented to  Procedure(s): AMPUTATION TOE MPJ  FOURTH AND FIFTH (Right) METATARSLSECTOMY FOURTH DIGIT (Right) ACHILLES LENGTHENING/KIDNER (Right) as a surgical intervention.  The patient's history has been reviewed, patient examined, no change in status, stable for surgery.  I have reviewed the patient's chart and labs.  Questions were answered to the patient's satisfaction.     Evelina Bucy

## 2019-05-17 NOTE — H&P (Signed)
Anesthesia H&P Update: History and Physical Exam reviewed; patient is OK for planned anesthetic and procedure. ? ?

## 2019-05-17 NOTE — Anesthesia Postprocedure Evaluation (Signed)
Anesthesia Post Note  Patient: Jason [name]  Procedure(s) Performed: AMPUTATION TOE Metatarsal Phalangeal Joint   FOURTH AND FIFTH, FILLETED TOE FLAP (Right Foot) METATARSLSECTOMY FOURTH DIGIT (Right Foot) ACHILLES LENGTHENING/KIDNER (Right Foot)     Patient location during evaluation: PACU Anesthesia Type: MAC Level of consciousness: awake and alert Pain management: pain level controlled Vital Signs Assessment: post-procedure vital signs reviewed and stable Respiratory status: spontaneous breathing, nonlabored ventilation and respiratory function stable Cardiovascular status: stable and blood pressure returned to baseline Postop Assessment: no apparent nausea or vomiting Anesthetic complications: no    Last Vitals:  Vitals:   05/17/19 0902 05/17/19 0909  BP: (!) 145/70 136/83  Pulse: 65 80  Resp: 16 (!) 29  Temp:  (!) 36.4 C  SpO2: 100% 98%    Last Pain:  Vitals:   05/17/19 0909  TempSrc:   PainSc: 3                  Lynda Rainwater

## 2019-05-17 NOTE — Op Note (Signed)
Patient Name: Jason [name] DOB: [birth year]  MRN: [id number]   Date of Service: 05/17/2019  Surgeon: Dr. Hardie Pulley, DPM Assistants: None Pre-operative Diagnosis:  Osteomyelitis right foot, equinus Post-operative Diagnosis:  Same Procedures:  1) Amputation right fourth toe and metatarsal  2) Amputation right fifth toe  3) Fillet toe flap  4) Achilles tendon lengthening Pathology/Specimens: ID Type Source Tests Collected by Time Destination  1 : Right fourth and fifth toe, fourth metatarsal Amputation Toe, Right SURGICAL PATHOLOGY Evelina Bucy, DPM 05/17/2019 0800    Anesthesia: MAC local Hemostasis: * No tourniquets in log * Estimated Blood Loss: 50 mL Materials:  Implant Name Type Inv. Item Serial No. Manufacturer Lot No. LRB No. Used Action  COLLAGEN CELLERATERX 5 GRAM - [id number] Miscellaneous COLLAGEN CELLERATERX 5 GRAM [id number] [employer]  Right 1 Implanted   Medications: Vancomycin powder Complications: None  Indications for Procedure:  This is a 78 y.o. male with osteomyelitis of the right fourth toe.  He also has a chronic painful callus to the plantar aspect of the foot.  He is status post transmetatarsal amputation.  He has a equinus deformity noted to the foot.  It was discussed he would benefit from amputation of the fourth toe to the osteomyelitis.  Given that he would have only 1 toe remaining it was discussed that he would benefit from amputation of the fifth toe as well.  Patient elected to proceed to the above.   Procedure in Detail: Patient was identified in pre-operative holding area. Formal consent was signed and the right lower extremity was marked. Patient was brought back to the operating room. Anesthesia was induced. The extremity was prepped and draped in the usual sterile fashion. Timeout was taken to confirm patient name, laterality, and procedure prior to incision.   Attention was directed first to the posterior aspect of the  right calf.  The foot remained sterilely under the stockinette for this portion of the procedure. An incision was made at the central aspect of the Achilles tendon 3 cm proximal to the Achilles tendon insertion.  The blade was then rotated medially and the Achilles tendon was hemi-transected.  An additional incision was made 2 cm proximal to this an incision was again made and the blade was rotated laterally.  A final hemi-transection was performed an additional 2 cm proximal to this with the blade being rotated medially.  The ankle was then thoroughly manipulated to stretch the Achilles tendon and the resultant ankle was then was able to be brought to about 10 degrees of dorsiflexion.  The incisions were then copiously irrigated and closed with skin staples  Attention was then directed to the right fourth toe.  A linear incision was made at the metatarsophalangeal joint and onto the toe..  Dissection was carried down to level of bone.  The collateral ligaments were freed from the metatarsal phalangeal joint and the toe was disarticulated the metatarsal phalangeal joint.  The incision was continued onto the metatarsal and the metatarsal head was identified.  Fluoroscopy was used to assess the metatarsal parabola and a sagittal saw was used to resect the fourth metatarsal head with care to maintain the metatarsal parabola.  At this point there was a 3 x 3 cm wound that would not closed but significant tension.  Given that the fifth toe was going to be amputated I proceeded to prepare a filleted toe flap of the fifth toe with plan to cover this area for better wound coverage.  A linear incision was made at the medial aspect of the fifth toe.  Dissection was carried down to bone.  Full-thickness flaps were raised off the bones of the toe.  The proximal middle and distal phalanges of the toe were filleted out from the underlying soft tissue with care to avoid damage to the vascular pedicle.  The nail bed and  matrix were excised.  Tendon attachments were excised.  The interposing soft tissue in the fourth interspace was excised allowing rotation of the filleted toe flap onto the fourth toe metatarsal wound.  This was then adhered in layers with 2-0 Monocryl, 4-0 nylon, and skin staples.  The resultant deficit closed very well.  The foot was then dressed with Xeroform 4 x 4 Kerlix and Ace bandage. Patient tolerated the procedure well.   Disposition: Following a period of post-operative monitoring, patient will be transferred home.

## 2019-05-17 NOTE — Anesthesia Preprocedure Evaluation (Signed)
Anesthesia Evaluation  Patient identified by MRN, date of birth, ID band Patient awake    Reviewed: Allergy & Precautions, NPO status , Patient's Chart, lab work & pertinent test results  History of Anesthesia Complications (+) PONV  Airway Mallampati: II  TM Distance: >3 FB Neck ROM: Full    Dental no notable dental hx.    Pulmonary asthma , sleep apnea , COPD, former smoker,    Pulmonary exam normal breath sounds clear to auscultation       Cardiovascular + Peripheral Vascular Disease  Normal cardiovascular exam Rhythm:Regular Rate:Normal     Neuro/Psych  Headaches, Anxiety Depression negative psych ROS   GI/Hepatic Neg liver ROS, GERD  ,  Endo/Other  negative endocrine ROSdiabetes, Type 2  Renal/GU negative Renal ROS  negative genitourinary   Musculoskeletal  (+) Arthritis ,   Abdominal (+) + obese,   Peds negative pediatric ROS (+)  Hematology negative hematology ROS (+)   Anesthesia Other Findings   Reproductive/Obstetrics negative OB ROS                             Anesthesia Physical Anesthesia Plan  ASA: III  Anesthesia Plan: MAC   Post-op Pain Management:    Induction: Intravenous  PONV Risk Score and Plan: 2 and Ondansetron, Midazolam and Treatment may vary due to age or medical condition  Airway Management Planned: Simple Face Mask  Additional Equipment:   Intra-op Plan:   Post-operative Plan:   Informed Consent: I have reviewed the patients History and Physical, chart, labs and discussed the procedure including the risks, benefits and alternatives for the proposed anesthesia with the patient or authorized representative who has indicated his/her understanding and acceptance.     Dental advisory given  Plan Discussed with: CRNA  Anesthesia Plan Comments:         Anesthesia Quick Evaluation

## 2019-05-17 NOTE — Brief Op Note (Signed)
05/17/2019  8:43 AM  PATIENT:  Lucile Shutters  78 y.o. male  PRE-OPERATIVE DIAGNOSIS:  ULCER, BONE EROSION,OSTEOMYLITIS,  POST-OPERATIVE DIAGNOSIS:  ULCER, BONE EROSION,OSTEOMYLITIS,  PROCEDURE:  Procedure(s): AMPUTATION TOE Metatarsal Phalangeal Joint   FOURTH AND FIFTH, FILLETED TOE FLAP (Right) METATARSLSECTOMY FOURTH DIGIT (Right) ACHILLES LENGTHENING/KIDNER (Right)  SURGEON:  Surgeon(s) and Role:    * Evelina Bucy, DPM - Primary  PHYSICIAN ASSISTANT:   ASSISTANTS: none   ANESTHESIA:   local and MAC  EBL:  50 mL   BLOOD ADMINISTERED:none  DRAINS: none   LOCAL MEDICATIONS USED:  MARCAINE    and Amount: 50 ml  SPECIMEN:   ID Type Source Tests Collected by Time Destination  1 : Right fourth and fifth toe, fourth metatarsal Amputation Toe, Right SURGICAL PATHOLOGY Evelina Bucy, DPM 05/17/2019 0800       DISPOSITION OF SPECIMEN:  PATHOLOGY  COUNTS:  YES  TOURNIQUET:  * No tourniquets in log *  DICTATION: .Dragon Dictation  PLAN OF CARE: Discharge to home after PACU  PATIENT DISPOSITION:  PACU - hemodynamically stable.   Delay start of Pharmacological VTE agent (>24hrs) due to surgical blood loss or risk of bleeding: not applicable

## 2019-05-21 ENCOUNTER — Telehealth: Payer: Self-pay | Admitting: *Deleted

## 2019-05-21 ENCOUNTER — Ambulatory Visit: Payer: Self-pay

## 2019-05-21 ENCOUNTER — Encounter (HOSPITAL_COMMUNITY): Payer: Self-pay | Admitting: Podiatry

## 2019-05-21 ENCOUNTER — Other Ambulatory Visit: Payer: Self-pay

## 2019-05-21 NOTE — Patient Outreach (Signed)
[facility]) Care Management  05/21/2019  Jason [name] 05/14/1941 [id number]   Attempted to contact patient today to follow up on possible transportation and meal delivery resources post surgery.  BSW left voicemail message.  Will attempt to reach again within four business days.  Ronn Melena, BSW Social Worker [phone]

## 2019-05-21 NOTE — Telephone Encounter (Signed)
DOS 05/17/2019; 14350 - FILLTED TOE FLAP 4,5 AND 15400 - MANIPULATION ANKLE RIGHT FOOT/ANKLE  HTA: Authorization Required  AUTHORIZATION # [id number] Valid from 05/17/2019 - 08/15/2019

## 2019-05-22 ENCOUNTER — Other Ambulatory Visit: Payer: Self-pay

## 2019-05-22 NOTE — Patient Outreach (Signed)
[facility]) Care Management  05/22/2019  Jason [name] October 10, 1941 [id number]   Successful follow up call to patient regarding social work referral,  Patient denies need for transportation resources at this time.  BSW and patient discussed [facility] which will allow him to receive delivered meals.  Patient consented to referral being submitted.  BSW informed patient that program is temporary and he will be contacted in the future about the end date.  BSW is closing case at this time but encouraged patient to call if additional needs arise.   Ronn Melena, BSW Social Worker [phone]

## 2019-05-24 ENCOUNTER — Ambulatory Visit (INDEPENDENT_AMBULATORY_CARE_PROVIDER_SITE_OTHER): Payer: HMO | Admitting: Podiatry

## 2019-05-24 ENCOUNTER — Telehealth: Payer: Self-pay | Admitting: Cardiology

## 2019-05-24 ENCOUNTER — Other Ambulatory Visit: Payer: Self-pay

## 2019-05-24 VITALS — Temp 97.6°F

## 2019-05-24 DIAGNOSIS — L97514 Non-pressure chronic ulcer of other part of right foot with necrosis of bone: Secondary | ICD-10-CM

## 2019-05-24 DIAGNOSIS — I872 Venous insufficiency (chronic) (peripheral): Secondary | ICD-10-CM

## 2019-05-24 DIAGNOSIS — R600 Localized edema: Secondary | ICD-10-CM

## 2019-05-24 MED ORDER — CLINDAMYCIN HCL 300 MG PO CAPS: 300.0000 mg | ORAL_CAPSULE | Freq: Three times a day (TID) | ORAL | 0 refills | Status: DC

## 2019-05-24 NOTE — Progress Notes (Signed)
Subjective:  Patient ID: Jason [name], male    DOB: [birth year],  MRN: [id number]  Chief Complaint  Patient presents with  . Routine Post Op     DOS 05/17/2019 AMPUTATION TOE MPJ 4,5; METATARSECTOMY 4TH; AND TENDO-ACHILLES LENGTHENING RT -" my foot feels pretty good, I am having some discomfort at the achilles tendon area"     DOS: 05/17/2019 Procedure: Right 4th/5th Toe amputation with Filleted toe flap, 4th metatarsectomy, TAL.  78 y.o. male returns for post-op check. Doing well, some pain in the back of the calf.   Review of Systems: Negative except as noted in the HPI. Denies N/V/F/Ch.  Past Medical History:  Diagnosis Date  . Anxiety   . Asthma    in past, no inhaler now  . Benign neoplasm of colon   . Bone infection ([place])   . Cancer [facility])    bladder cancer  . COPD (chronic obstructive pulmonary disease) ([place])   . Depression   . Diverticulosis of colon (without mention of hemorrhage)   . DJD (degenerative joint disease)   . Early cataract   . Esophageal reflux   . Glaucoma   . Headache   . Heel cord tightness, right   . Hidradenitis   . History of BPH   . History of gynecomastia    Unilateral  . Hyperlipidemia   . Irritable bowel syndrome   . Memory loss   . Mitral stenosis    mild-moderate MS 01/2016  . MVP (mitral valve prolapse)   . Obesity, unspecified   . Pneumonia   . PONV (postoperative nausea and vomiting)   . Sleep apnea    does not use  cpap or bipap .. no study  ever  . Type II or unspecified type diabetes mellitus without mention of complication, not stated as uncontrolled   . Unspecified venous (peripheral) insufficiency   . Wears dentures   . Wears glasses     Current Outpatient Medications:  .  aspirin EC 81 MG tablet, Take 1 tablet (81 mg total) by mouth daily., Disp: 90 tablet, Rfl: 3 .  Blood Glucose Monitoring Suppl (ONETOUCH VERIO) w/Device KIT, 1 Act by Does not apply route 3 (three) times daily., Disp: 1 kit, Rfl: 2 .  clindamycin  (CLEOCIN) 300 MG capsule, Take 1 capsule (300 mg total) by mouth 3 (three) times daily., Disp: 21 capsule, Rfl: 0 .  docusate sodium (COLACE) 100 MG capsule, Take 1 capsule (100 mg total) by mouth 2 (two) times daily. (Patient taking differently: Take 100 mg by mouth 2 (two) times daily as needed (constipation.). ), Disp: 10 capsule, Rfl: 0 .  Ertugliflozin L-PyroglutamicAc (STEGLATRO) 5 MG TABS, Take 1 tablet by mouth daily. (Patient not taking: Reported on 05/14/2019), Disp: 90 tablet, Rfl: 1 .  famotidine (PEPCID) 20 MG tablet, Take 1 tablet (20 mg total) by mouth 2 (two) times daily. (Patient taking differently: Take 20 mg by mouth 2 (two) times daily as needed (heartburn/indigestion.). ), Disp: 10 tablet, Rfl: 0 .  glimepiride (AMARYL) 2 MG tablet, Take 1 tablet (2 mg total) by mouth daily with breakfast., Disp: 90 tablet, Rfl: 0 .  glucose blood (TRUE METRIX BLOOD GLUCOSE TEST) test strip, Use to test blood sugar twice daily. DX: E11.9, Disp: 200 each, Rfl: 3 .  metFORMIN (GLUCOPHAGE) 500 MG tablet, Take 2 tablets (1,000 mg total) by mouth 2 (two) times daily., Disp: 360 tablet, Rfl: 0 .  oxyCODONE-acetaminophen (PERCOCET) 5-325 MG tablet, Take 1 tablet by mouth  every 4 (four) hours as needed for severe pain., Disp: 20 tablet, Rfl: 0 .  simvastatin (ZOCOR) 40 MG tablet, Take 1 tablet (40 mg total) by mouth every evening., Disp: 90 tablet, Rfl: 1 .  Skin Protectants, Misc. (EUCERIN) cream, Apply 1 application topically as needed for dry skin (ear itching.)., Disp: , Rfl:  .  TRUEplus Lancets 33G MISC, Use to test blood sugar twice daily. DX: E11.9, Disp: 200 each, Rfl: 3 .  vitamin C (ASCORBIC ACID) 500 MG tablet, Take 1,000 mg by mouth daily., Disp: , Rfl:   Social History   Tobacco Use  Smoking Status Former Smoker  . Packs/day: 0.10  . Years: 5.00  . Pack years: 0.50  . Types: Cigarettes  . Quit date: 05/10/2015  . Years since quitting: 4.0  Smokeless Tobacco Never Used    Allergies   Allergen Reactions  . Bactrim [Sulfamethoxazole-Trimethoprim] Rash   Objective:   Vitals:   05/24/19 0954  Temp: 97.6 F (36.4 C)   There is no height or weight on file to calculate BMI. Constitutional Well developed. Well nourished.  Vascular Foot warm and well perfused. Capillary refill normal to all digits.   Neurologic Normal speech. Oriented to person, place, and time. Epicritic sensation to light touch grossly present bilaterally.  Dermatologic  incision is healing well without warmth erythema active drainage or signs of acute infection.  Skin edges well coapted with intact suture and staple material  Orthopedic: Tenderness to palpation noted about the surgical site.   Radiographs: None today Assessment:   1. Ulcer of great toe, right, with necrosis of bone (HCC)   2. Venous (peripheral) insufficiency   3. Localized edema    Plan:  Patient was evaluated and treated and all questions answered.  S/p foot surgery right -Progressing as expected post-operatively. -XR: None -WB Status: Weight-bear as tolerated in walker boot -Sutures: Left intact. -Medications: Antibiotic refilled today for 2 weeks post procedure prophylaxis -Foot redressed.  Multilayer Unna boot applied today  Return in about 1 week (around 05/31/2019) for [name] patient, keep current post op appt.

## 2019-05-24 NOTE — Telephone Encounter (Signed)
LVM to call back and schedule new patient appointment with Dr. Percival Spanish.

## 2019-05-30 ENCOUNTER — Other Ambulatory Visit: Payer: Self-pay

## 2019-05-30 ENCOUNTER — Ambulatory Visit: Payer: HMO | Admitting: *Deleted

## 2019-05-30 NOTE — Patient Outreach (Signed)
  [facility]) Care Management Chronic Special Needs Program   05/30/2019  Name: Jason [name], DOB: [birth year]  MRN: [id number]  The client was discussed in today's interdisciplinary care team meeting.  The following issues were discussed:  Client's needs, Key risk triggers/risk stratification, Care Plan, Coordination of care and Issues/barriers to care  Participants present:   Mahlon Gammon, MSN, RN, CCM, CNS    Thea Silversmith, MSN, RN, CCM   Melissa Sandlin RN,BSN,CCM, CDE     Marco Collie, MD  Bary Castilla, RN, BSN, MS, CCM Coralie Carpen, MD  Recommendations:  Follow up on referrals to social work and pharmacy, continue to provide diabetes self management   Follow-up:  Plan to follow up as scheduled  Peter Garter RN, Jackquline [place], [facility] [phone]

## 2019-05-31 ENCOUNTER — Ambulatory Visit (INDEPENDENT_AMBULATORY_CARE_PROVIDER_SITE_OTHER): Payer: Self-pay | Admitting: Podiatry

## 2019-05-31 ENCOUNTER — Other Ambulatory Visit: Payer: Self-pay

## 2019-05-31 DIAGNOSIS — Z9889 Other specified postprocedural states: Secondary | ICD-10-CM

## 2019-06-04 DIAGNOSIS — R3915 Urgency of urination: Secondary | ICD-10-CM | POA: Diagnosis not present

## 2019-06-04 DIAGNOSIS — C672 Malignant neoplasm of lateral wall of bladder: Secondary | ICD-10-CM | POA: Diagnosis not present

## 2019-06-06 ENCOUNTER — Telehealth: Payer: Self-pay | Admitting: Internal Medicine

## 2019-06-06 DIAGNOSIS — E1165 Type 2 diabetes mellitus with hyperglycemia: Secondary | ICD-10-CM

## 2019-06-06 DIAGNOSIS — IMO0002 Reserved for concepts with insufficient information to code with codable children: Secondary | ICD-10-CM

## 2019-06-06 DIAGNOSIS — I70209 Unspecified atherosclerosis of native arteries of extremities, unspecified extremity: Secondary | ICD-10-CM

## 2019-06-06 DIAGNOSIS — E1151 Type 2 diabetes mellitus with diabetic peripheral angiopathy without gangrene: Secondary | ICD-10-CM

## 2019-06-06 MED ORDER — METFORMIN HCL 500 MG PO TABS: 1000.0000 mg | ORAL_TABLET | Freq: Two times a day (BID) | ORAL | 0 refills | Status: DC

## 2019-06-06 MED ORDER — GLIMEPIRIDE 2 MG PO TABS: 2.0000 mg | ORAL_TABLET | Freq: Every day | ORAL | 0 refills | Status: DC

## 2019-06-06 MED ORDER — SIMVASTATIN 40 MG PO TABS: 40.0000 mg | ORAL_TABLET | Freq: Every evening | ORAL | 0 refills | Status: DC

## 2019-06-06 NOTE — Telephone Encounter (Signed)
Copied from [place] [id number]. Topic: Quick Communication - Rx Refill/Question >> Jun 06, 2019  1:37 PM Leward Quan A wrote: Medication: glimepiride (AMARYL) 2 MG tablet, metFORMIN (GLUCOPHAGE) 500 MG tablet , simvastatin (ZOCOR) 40 MG tablet  Has the patient contacted their pharmacy? Yes.   (Agent: If no, request that the patient contact the pharmacy for the refill.) (Agent: If yes, when and what did the pharmacy advise?)  Preferred Pharmacy (with phone number or street name): [facility]) - [place], [place] [phone] (Phone) [phone] (Fax)    Agent: Please be advised that RX refills may take up to 3 business days. We ask that you follow-up with your pharmacy.

## 2019-06-07 ENCOUNTER — Ambulatory Visit: Payer: HMO | Admitting: Podiatry

## 2019-06-07 NOTE — Progress Notes (Signed)
Subjective:  Patient ID: Jason [name], male    DOB: 12-01-1940,  MRN: [id number]  Chief Complaint  Patient presents with  . Routine Post Op     DOS 05/17/2019 AMPUTATION TOE MPJ 4,5; METATARSECTOMY 4TH; AND TENDO-ACHILLES LENGTHENING RT    DOS: 05/17/2019 Procedure: Right 4th/5th Toe amputation with Filleted toe flap, 4th metatarsectomy, TAL.  78 y.o. male returns for post-op check. Doing well,  still having some soreness in the back of the calf otherwise denies fever chills nausea vomiting  Review of Systems: Negative except as noted in the HPI. Denies N/V/F/Ch.  Past Medical History:  Diagnosis Date  . Anxiety   . Asthma    in past, no inhaler now  . Benign neoplasm of colon   . Bone infection ([place])   . Cancer [facility])    bladder cancer  . COPD (chronic obstructive pulmonary disease) ([place])   . Depression   . Diverticulosis of colon (without mention of hemorrhage)   . DJD (degenerative joint disease)   . Early cataract   . Esophageal reflux   . Glaucoma   . Headache   . Heel cord tightness, right   . Hidradenitis   . History of BPH   . History of gynecomastia    Unilateral  . Hyperlipidemia   . Irritable bowel syndrome   . Memory loss   . Mitral stenosis    mild-moderate MS 01/2016  . MVP (mitral valve prolapse)   . Obesity, unspecified   . Pneumonia   . PONV (postoperative nausea and vomiting)   . Sleep apnea    does not use  cpap or bipap .. no study  ever  . Type II or unspecified type diabetes mellitus without mention of complication, not stated as uncontrolled   . Unspecified venous (peripheral) insufficiency   . Wears dentures   . Wears glasses     Current Outpatient Medications:  .  aspirin EC 81 MG tablet, Take 1 tablet (81 mg total) by mouth daily., Disp: 90 tablet, Rfl: 3 .  Blood Glucose Monitoring Suppl (ONETOUCH VERIO) w/Device KIT, 1 Act by Does not apply route 3 (three) times daily., Disp: 1 kit, Rfl: 2 .  clindamycin (CLEOCIN) 300 MG capsule,  Take 1 capsule (300 mg total) by mouth 3 (three) times daily., Disp: 21 capsule, Rfl: 0 .  docusate sodium (COLACE) 100 MG capsule, Take 1 capsule (100 mg total) by mouth 2 (two) times daily. (Patient taking differently: Take 100 mg by mouth 2 (two) times daily as needed (constipation.). ), Disp: 10 capsule, Rfl: 0 .  Ertugliflozin L-PyroglutamicAc (STEGLATRO) 5 MG TABS, Take 1 tablet by mouth daily. (Patient not taking: Reported on 05/14/2019), Disp: 90 tablet, Rfl: 1 .  famotidine (PEPCID) 20 MG tablet, Take 1 tablet (20 mg total) by mouth 2 (two) times daily. (Patient taking differently: Take 20 mg by mouth 2 (two) times daily as needed (heartburn/indigestion.). ), Disp: 10 tablet, Rfl: 0 .  glimepiride (AMARYL) 2 MG tablet, Take 1 tablet (2 mg total) by mouth daily with breakfast. -- Office visit needed for further refills, Disp: 90 tablet, Rfl: 0 .  glucose blood (TRUE METRIX BLOOD GLUCOSE TEST) test strip, Use to test blood sugar twice daily. DX: E11.9, Disp: 200 each, Rfl: 3 .  metFORMIN (GLUCOPHAGE) 500 MG tablet, Take 2 tablets (1,000 mg total) by mouth 2 (two) times daily. -- Office visit needed for further refills, Disp: 360 tablet, Rfl: 0 .  oxyCODONE-acetaminophen (PERCOCET) 5-325 MG tablet,  Take 1 tablet by mouth every 4 (four) hours as needed for severe pain., Disp: 20 tablet, Rfl: 0 .  simvastatin (ZOCOR) 40 MG tablet, Take 1 tablet (40 mg total) by mouth every evening. -- Office visit needed for further refills, Disp: 90 tablet, Rfl: 0 .  Skin Protectants, Misc. (EUCERIN) cream, Apply 1 application topically as needed for dry skin (ear itching.)., Disp: , Rfl:  .  TRUEplus Lancets 33G MISC, Use to test blood sugar twice daily. DX: E11.9, Disp: 200 each, Rfl: 3 .  vitamin C (ASCORBIC ACID) 500 MG tablet, Take 1,000 mg by mouth daily., Disp: , Rfl:   Social History   Tobacco Use  Smoking Status Former Smoker  . Packs/day: 0.10  . Years: 5.00  . Pack years: 0.50  . Types: Cigarettes   . Quit date: 05/10/2015  . Years since quitting: 4.0  Smokeless Tobacco Never Used    Allergies  Allergen Reactions  . Bactrim [Sulfamethoxazole-Trimethoprim] Rash   Objective:   There were no vitals filed for this visit. There is no height or weight on file to calculate BMI. Constitutional Well developed. Well nourished.  Vascular Foot warm and well perfused. Capillary refill normal to all digits.   Neurologic Normal speech. Oriented to person, place, and time. Epicritic sensation to light touch grossly present bilaterally.  Dermatologic  incision is healing well.  Sutures and staples appear intact without sign of dehiscence.  No warmth erythema signs of acute infection  Orthopedic: Tenderness to palpation noted about the surgical site.   Radiographs: None today Assessment:   1. Post-operative state    Plan:  Patient was evaluated and treated and all questions answered.  S/p foot surgery right -Progressing as expected post-operatively. -XR: None -WB Status: Weight-bear as tolerated in walker boot -Sutures: Left intact for one additional week. -Medications: None.  Return in 1 week for suture removal  Return in about 1 week (around 06/07/2019).

## 2019-06-08 ENCOUNTER — Other Ambulatory Visit: Payer: Self-pay

## 2019-06-08 ENCOUNTER — Ambulatory Visit (INDEPENDENT_AMBULATORY_CARE_PROVIDER_SITE_OTHER): Payer: HMO | Admitting: Podiatry

## 2019-06-08 VITALS — Temp 97.5°F

## 2019-06-08 DIAGNOSIS — Z9889 Other specified postprocedural states: Secondary | ICD-10-CM

## 2019-06-10 NOTE — Progress Notes (Signed)
Subjective:  Patient ID: Jason [name], male    DOB: [birth year],  MRN: [id number]  Chief Complaint  Patient presents with  . Routine Post Op    Pt states healing well with no concerns. Denies fever/nausea/vomiting/chills. No drainage. Pt states his feet are very cold due to compression socks.    DOS: 05/17/2019 Procedure: Right 4th/5th Toe amputation with Filleted toe flap, 4th metatarsectomy, TAL.  78 y.o. male returns for post-op check.  Doing well still having some soreness in the calf but otherwise denies pain  Review of Systems: Negative except as noted in the HPI. Denies N/V/F/Ch.  Past Medical History:  Diagnosis Date  . Anxiety   . Asthma    in past, no inhaler now  . Benign neoplasm of colon   . Bone infection ([place])   . Cancer [facility])    bladder cancer  . COPD (chronic obstructive pulmonary disease) ([place])   . Depression   . Diverticulosis of colon (without mention of hemorrhage)   . DJD (degenerative joint disease)   . Early cataract   . Esophageal reflux   . Glaucoma   . Headache   . Heel cord tightness, right   . Hidradenitis   . History of BPH   . History of gynecomastia    Unilateral  . Hyperlipidemia   . Irritable bowel syndrome   . Memory loss   . Mitral stenosis    mild-moderate MS 01/2016  . MVP (mitral valve prolapse)   . Obesity, unspecified   . Pneumonia   . PONV (postoperative nausea and vomiting)   . Sleep apnea    does not use  cpap or bipap .. no study  ever  . Type II or unspecified type diabetes mellitus without mention of complication, not stated as uncontrolled   . Unspecified venous (peripheral) insufficiency   . Wears dentures   . Wears glasses     Current Outpatient Medications:  .  aspirin EC 81 MG tablet, Take 1 tablet (81 mg total) by mouth daily., Disp: 90 tablet, Rfl: 3 .  Blood Glucose Monitoring Suppl (ONETOUCH VERIO) w/Device KIT, 1 Act by Does not apply route 3 (three) times daily., Disp: 1 kit, Rfl: 2 .  clindamycin  (CLEOCIN) 300 MG capsule, Take 1 capsule (300 mg total) by mouth 3 (three) times daily., Disp: 21 capsule, Rfl: 0 .  docusate sodium (COLACE) 100 MG capsule, Take 1 capsule (100 mg total) by mouth 2 (two) times daily. (Patient taking differently: Take 100 mg by mouth 2 (two) times daily as needed (constipation.). ), Disp: 10 capsule, Rfl: 0 .  Ertugliflozin L-PyroglutamicAc (STEGLATRO) 5 MG TABS, Take 1 tablet by mouth daily., Disp: 90 tablet, Rfl: 1 .  famotidine (PEPCID) 20 MG tablet, Take 1 tablet (20 mg total) by mouth 2 (two) times daily. (Patient taking differently: Take 20 mg by mouth 2 (two) times daily as needed (heartburn/indigestion.). ), Disp: 10 tablet, Rfl: 0 .  glimepiride (AMARYL) 2 MG tablet, Take 1 tablet (2 mg total) by mouth daily with breakfast. -- Office visit needed for further refills, Disp: 90 tablet, Rfl: 0 .  glucose blood (TRUE METRIX BLOOD GLUCOSE TEST) test strip, Use to test blood sugar twice daily. DX: E11.9, Disp: 200 each, Rfl: 3 .  metFORMIN (GLUCOPHAGE) 500 MG tablet, Take 2 tablets (1,000 mg total) by mouth 2 (two) times daily. -- Office visit needed for further refills, Disp: 360 tablet, Rfl: 0 .  oxyCODONE-acetaminophen (PERCOCET) 5-325 MG tablet, Take 1  tablet by mouth every 4 (four) hours as needed for severe pain., Disp: 20 tablet, Rfl: 0 .  simvastatin (ZOCOR) 40 MG tablet, Take 1 tablet (40 mg total) by mouth every evening. -- Office visit needed for further refills, Disp: 90 tablet, Rfl: 0 .  Skin Protectants, Misc. (EUCERIN) cream, Apply 1 application topically as needed for dry skin (ear itching.)., Disp: , Rfl:  .  TRUEplus Lancets 33G MISC, Use to test blood sugar twice daily. DX: E11.9, Disp: 200 each, Rfl: 3 .  vitamin C (ASCORBIC ACID) 500 MG tablet, Take 1,000 mg by mouth daily., Disp: , Rfl:   Social History   Tobacco Use  Smoking Status Former Smoker  . Packs/day: 0.10  . Years: 5.00  . Pack years: 0.50  . Types: Cigarettes  . Quit date:  05/10/2015  . Years since quitting: 4.0  Smokeless Tobacco Never Used    Allergies  Allergen Reactions  . Bactrim [Sulfamethoxazole-Trimethoprim] Rash   Objective:   Vitals:   06/08/19 1120  Temp: (!) 97.5 F (36.4 C)   There is no height or weight on file to calculate BMI. Constitutional Well developed. Well nourished.  Vascular Foot warm and well perfused. Capillary refill normal to all digits.   Neurologic Normal speech. Oriented to person, place, and time. Epicritic sensation to light touch grossly present bilaterally.  Dermatologic Incisions healing well without signs of dehiscence.  No warmth no erythema seen no signs of acute infection  Plantar forefoot callus resolving  Orthopedic: Tenderness to palpation noted about the surgical site.   Radiographs: None today Assessment:   1. Post-operative state    Plan:  Patient was evaluated and treated and all questions answered.  S/p foot surgery right -Progressing as expected post-operatively. -XR: None -WB Status: Weight-bear as tolerated in walker boot -Sutures: removed. Staples removed. Ok to shower but not soak. -Medications: None.  Return in 1 week for suture removal  No follow-ups on file.

## 2019-06-15 ENCOUNTER — Other Ambulatory Visit: Payer: Self-pay

## 2019-06-15 ENCOUNTER — Ambulatory Visit (INDEPENDENT_AMBULATORY_CARE_PROVIDER_SITE_OTHER): Payer: HMO | Admitting: Podiatry

## 2019-06-15 VITALS — Temp 97.4°F

## 2019-06-15 DIAGNOSIS — L97514 Non-pressure chronic ulcer of other part of right foot with necrosis of bone: Secondary | ICD-10-CM | POA: Diagnosis not present

## 2019-06-17 NOTE — Progress Notes (Signed)
Subjective:  Patient ID: Jason [name], male    DOB: May 28, 1941,  MRN: [id number]  Chief Complaint  Patient presents with  . Routine Post Op    DOS 05/17/2019 Pt states no concerns, some blood drainage, denies fever/nausea/vomiting/chills.    DOS: 05/17/2019 Procedure: Right 4th/5th Toe amputation with Filleted toe flap, 4th metatarsectomy, TAL.  78 y.o. male returns for post-op check.  No longer having calf pain endorses only slight bloody drainage at the dorsal incision area.  Review of Systems: Negative except as noted in the HPI. Denies N/V/F/Ch.  Past Medical History:  Diagnosis Date  . Anxiety   . Asthma    in past, no inhaler now  . Benign neoplasm of colon   . Bone infection ([place])   . Cancer [facility])    bladder cancer  . COPD (chronic obstructive pulmonary disease) ([place])   . Depression   . Diverticulosis of colon (without mention of hemorrhage)   . DJD (degenerative joint disease)   . Early cataract   . Esophageal reflux   . Glaucoma   . Headache   . Heel cord tightness, right   . Hidradenitis   . History of BPH   . History of gynecomastia    Unilateral  . Hyperlipidemia   . Irritable bowel syndrome   . Memory loss   . Mitral stenosis    mild-moderate MS 01/2016  . MVP (mitral valve prolapse)   . Obesity, unspecified   . Pneumonia   . PONV (postoperative nausea and vomiting)   . Sleep apnea    does not use  cpap or bipap .. no study  ever  . Type II or unspecified type diabetes mellitus without mention of complication, not stated as uncontrolled   . Unspecified venous (peripheral) insufficiency   . Wears dentures   . Wears glasses     Current Outpatient Medications:  .  aspirin EC 81 MG tablet, Take 1 tablet (81 mg total) by mouth daily., Disp: 90 tablet, Rfl: 3 .  Blood Glucose Monitoring Suppl (ONETOUCH VERIO) w/Device KIT, 1 Act by Does not apply route 3 (three) times daily., Disp: 1 kit, Rfl: 2 .  clindamycin (CLEOCIN) 300 MG capsule, Take 1 capsule  (300 mg total) by mouth 3 (three) times daily., Disp: 21 capsule, Rfl: 0 .  docusate sodium (COLACE) 100 MG capsule, Take 1 capsule (100 mg total) by mouth 2 (two) times daily. (Patient taking differently: Take 100 mg by mouth 2 (two) times daily as needed (constipation.). ), Disp: 10 capsule, Rfl: 0 .  Ertugliflozin L-PyroglutamicAc (STEGLATRO) 5 MG TABS, Take 1 tablet by mouth daily., Disp: 90 tablet, Rfl: 1 .  famotidine (PEPCID) 20 MG tablet, Take 1 tablet (20 mg total) by mouth 2 (two) times daily. (Patient taking differently: Take 20 mg by mouth 2 (two) times daily as needed (heartburn/indigestion.). ), Disp: 10 tablet, Rfl: 0 .  glimepiride (AMARYL) 2 MG tablet, Take 1 tablet (2 mg total) by mouth daily with breakfast. -- Office visit needed for further refills, Disp: 90 tablet, Rfl: 0 .  glucose blood (TRUE METRIX BLOOD GLUCOSE TEST) test strip, Use to test blood sugar twice daily. DX: E11.9, Disp: 200 each, Rfl: 3 .  metFORMIN (GLUCOPHAGE) 500 MG tablet, Take 2 tablets (1,000 mg total) by mouth 2 (two) times daily. -- Office visit needed for further refills, Disp: 360 tablet, Rfl: 0 .  oxyCODONE-acetaminophen (PERCOCET) 5-325 MG tablet, Take 1 tablet by mouth every 4 (four) hours as needed  for severe pain., Disp: 20 tablet, Rfl: 0 .  simvastatin (ZOCOR) 40 MG tablet, Take 1 tablet (40 mg total) by mouth every evening. -- Office visit needed for further refills, Disp: 90 tablet, Rfl: 0 .  Skin Protectants, Misc. (EUCERIN) cream, Apply 1 application topically as needed for dry skin (ear itching.)., Disp: , Rfl:  .  TRUEplus Lancets 33G MISC, Use to test blood sugar twice daily. DX: E11.9, Disp: 200 each, Rfl: 3 .  vitamin C (ASCORBIC ACID) 500 MG tablet, Take 1,000 mg by mouth daily., Disp: , Rfl:   Social History   Tobacco Use  Smoking Status Former Smoker  . Packs/day: 0.10  . Years: 5.00  . Pack years: 0.50  . Types: Cigarettes  . Quit date: 05/10/2015  . Years since quitting: 4.1   Smokeless Tobacco Never Used    Allergies  Allergen Reactions  . Bactrim [Sulfamethoxazole-Trimethoprim] Rash   Objective:   Vitals:   06/15/19 1558  Temp: (!) 97.4 F (36.3 C)   There is no height or weight on file to calculate BMI. Constitutional Well developed. Well nourished.  Vascular Foot warm and well perfused. Capillary refill normal to all digits.   Neurologic Normal speech. Oriented to person, place, and time. Epicritic sensation to light touch grossly present bilaterally.  Dermatologic Incision at fourth fifth toe area with some incomplete healing but without true dehiscence.  No warmth erythema or signs of acute infection  Plantar forefoot callus resolving  Orthopedic:  No tenderness to palpation noted about the surgical site.   Radiographs: None today Assessment:   1. Chronic ulcer of toe, right, with necrosis of bone ([place])    Plan:  Patient was evaluated and treated and all questions answered.  S/p foot surgery right -Wound is healing well apply antibiotic ointment today patient to apply same daily.  Transition to surgical shoe.  Surgical shoe dispensed today.  Patient to continue this until next follow-up visit  Return in about 2 weeks (around 06/29/2019) for Post-op, Right.

## 2019-06-29 ENCOUNTER — Ambulatory Visit (INDEPENDENT_AMBULATORY_CARE_PROVIDER_SITE_OTHER): Payer: HMO | Admitting: Podiatry

## 2019-06-29 ENCOUNTER — Other Ambulatory Visit: Payer: Self-pay

## 2019-06-29 ENCOUNTER — Encounter: Payer: Self-pay | Admitting: Podiatry

## 2019-06-29 DIAGNOSIS — Z9889 Other specified postprocedural states: Secondary | ICD-10-CM

## 2019-07-02 ENCOUNTER — Telehealth: Payer: Self-pay | Admitting: *Deleted

## 2019-07-02 MED ORDER — MEDIHONEY WOUND/BURN DRESSING EX GEL: CUTANEOUS | 2 refills | Status: DC

## 2019-07-02 MED ORDER — SANTYL 250 UNIT/GM EX OINT: 1.0000 "application " | TOPICAL_OINTMENT | Freq: Every day | CUTANEOUS | 5 refills | Status: DC

## 2019-07-02 NOTE — Telephone Encounter (Signed)
Dr. March Rummage ordered Santyl for right toe ulcer, measuring 3 x 2 x 0.1cm of right toe ulcer to be applied 3 x week, apply Medihoney 3 x week until Santyl is recieved. Orders sent to [facility].

## 2019-07-02 NOTE — Telephone Encounter (Signed)
Faxed wound care orders to [facility].

## 2019-07-02 NOTE — Telephone Encounter (Signed)
Left message informing pt the Santyl would be prior authorized by the [facility] on [address], and he should pick up the [place] at that [facility] for the [facility] nurse to apply. Faxed orders to [facility].

## 2019-07-03 ENCOUNTER — Telehealth: Payer: Self-pay | Admitting: *Deleted

## 2019-07-03 NOTE — Telephone Encounter (Signed)
[facility] - Tammy H., pharmacist asked if we were writing for a 30 day supply, because what was ordered will only be a 28 day supply. I called and told Tammy the 30 gram would be fine, pt has +5refills.

## 2019-07-03 NOTE — Telephone Encounter (Signed)
[facility] - Tiffany states they need the spelling of pt's 1st name. I called and informed of spelling.

## 2019-07-04 ENCOUNTER — Telehealth: Payer: Self-pay | Admitting: *Deleted

## 2019-07-04 NOTE — Telephone Encounter (Signed)
Faxed required form, clinicals and demographics to [facility]. 

## 2019-07-04 NOTE — Telephone Encounter (Signed)
[facility] - Jason [name] presented to our office to inform that they do not participate with pt's current insurance.

## 2019-07-06 NOTE — Telephone Encounter (Signed)
I called [facility] and asked if accepted [employer], and she stated her supervisor said they do not accept [employer].

## 2019-07-06 NOTE — Telephone Encounter (Signed)
Jason [name] states they are unable to take pt at this time.

## 2019-07-06 NOTE — Telephone Encounter (Signed)
[facility] [place] states they do accept [employer], and checked if wound care service were staffable in [id number] area. [place] stated they are available to staff for pt's care.Faxed orders to [facility].

## 2019-07-06 NOTE — Telephone Encounter (Signed)
Left message [facility] for the operator to call advising if accept [employer].

## 2019-07-06 NOTE — Telephone Encounter (Signed)
[name] states they have put a hold on [employer] and can not accept pt at this time.

## 2019-07-06 NOTE — Telephone Encounter (Signed)
I called [facility] to see if accepted [employer] and listed to music for over 1 minute.

## 2019-07-09 NOTE — Telephone Encounter (Signed)
No that should be fine.

## 2019-07-09 NOTE — Telephone Encounter (Signed)
Please thanks for trying

## 2019-07-11 ENCOUNTER — Telehealth: Payer: Self-pay | Admitting: *Deleted

## 2019-07-11 NOTE — Telephone Encounter (Signed)
Yes let's eval first

## 2019-07-11 NOTE — Telephone Encounter (Signed)
[facility] states they are now open for nursing and PT, and does pt still need home health care.

## 2019-07-13 ENCOUNTER — Other Ambulatory Visit: Payer: Self-pay

## 2019-07-13 ENCOUNTER — Ambulatory Visit (INDEPENDENT_AMBULATORY_CARE_PROVIDER_SITE_OTHER): Payer: HMO | Admitting: Podiatry

## 2019-07-13 VITALS — Temp 97.5°F

## 2019-07-13 DIAGNOSIS — L97514 Non-pressure chronic ulcer of other part of right foot with necrosis of bone: Secondary | ICD-10-CM

## 2019-07-13 NOTE — Patient Instructions (Signed)
Pre-Operative Instructions  Congratulations, you have decided to take an important step towards improving your quality of life.  You can be assured that the doctors and staff at [facility] will be with you every step of the way.  Here are some important things you should know:  1. Plan to be at the surgery center/hospital at least 1 (one) hour prior to your scheduled time, unless otherwise directed by the surgical center/hospital staff.  You must have a responsible adult accompany you, remain during the surgery and drive you home.  Make sure you have directions to the surgical center/hospital to ensure you arrive on time. 2. If you are having surgery at [facility] or [facility], you will need a copy of your medical history and physical form from your family physician within one month prior to the date of surgery. We will give you a form for your primary physician to complete.  3. We make every effort to accommodate the date you request for surgery.  However, there are times where surgery dates or times have to be moved.  We will contact you as soon as possible if a change in schedule is required.   4. No aspirin/ibuprofen for one week before surgery.  If you are on aspirin, any non-steroidal anti-inflammatory medications (Mobic, Aleve, Ibuprofen) should not be taken seven (7) days prior to your surgery.  You make take Tylenol for pain prior to surgery.  5. Medications - If you are taking daily heart and blood pressure medications, seizure, reflux, allergy, asthma, anxiety, pain or diabetes medications, make sure you notify the surgery center/hospital before the day of surgery so they can tell you which medications you should take or avoid the day of surgery. 6. No food or drink after midnight the night before surgery unless directed otherwise by surgical center/hospital staff. 7. No alcoholic beverages 24-hours prior to surgery.  No smoking 24-hours prior or 24-hours after  surgery. 8. Wear loose pants or shorts. They should be loose enough to fit over bandages, boots, and casts. 9. Don't wear slip-on shoes. Sneakers are preferred. 10. Bring your boot with you to the surgery center/hospital.  Also bring crutches or a walker if your physician has prescribed it for you.  If you do not have this equipment, it will be provided for you after surgery. 11. If you have not been contacted by the surgery center/hospital by the day before your surgery, call to confirm the date and time of your surgery. 12. Leave-time from work may vary depending on the type of surgery you have.  Appropriate arrangements should be made prior to surgery with your employer. 13. Prescriptions will be provided immediately following surgery by your doctor.  Fill these as soon as possible after surgery and take the medication as directed. Pain medications will not be refilled on weekends and must be approved by the doctor. 14. Remove nail polish on the operative foot and avoid getting pedicures prior to surgery. 15. Wash the night before surgery.  The night before surgery wash the foot and leg well with water and the antibacterial soap provided. Be sure to pay special attention to beneath the toenails and in between the toes.  Wash for at least three (3) minutes. Rinse thoroughly with water and dry well with a towel.  Perform this wash unless told not to do so by your physician.  Enclosed: 1 Ice pack (please put in freezer the night before surgery)   1 Hibiclens skin cleaner     Pre-op instructions  If you have any questions regarding the instructions, please do not hesitate to call our office.  [place]: [address], [place], [place] [address] -- [phone]  [place]: [address] [place], [place] [address] -- [phone]  [place]: [address]  [place], [place] [address] -- [phone]  [place]: [address], [address], [place], [place] [address] -- [phone]  Website:  [email or web address] 

## 2019-07-17 ENCOUNTER — Other Ambulatory Visit: Payer: Self-pay | Admitting: *Deleted

## 2019-07-17 DIAGNOSIS — L97514 Non-pressure chronic ulcer of other part of right foot with necrosis of bone: Secondary | ICD-10-CM

## 2019-07-18 DIAGNOSIS — E785 Hyperlipidemia, unspecified: Secondary | ICD-10-CM | POA: Insufficient documentation

## 2019-07-18 NOTE — Progress Notes (Signed)
Cardiology Office Note   Date:  07/20/2019   ID:  Jason [name], DOB 08-Sep-1941, MRN [id number]  PCP:  [name], MD  Cardiologist:   Minus Breeding, MD   Chief Complaint  Patient presents with  . Mitral Regurgitation      History of Present Illness: Jason [name] is a 78 y.o. male who I saw in 2017.  He had MVP and moderate MR.  He was seen preoperatively in June prior to a prior to amputation of an infected toe related to long standing diabetes.  He is due to have some skin surgery on this.  I see an order in for an echocardiogram follow-up but this was not done.  He gets around slowly after his amputation.  He is not limited from a cardiac standpoint by his report.  That he has he does not describe new shortness of breath, PND or orthopnea.  He has no new chest pressure, neck or arm discomfort.  He has no new weight gain and has some chronic mild lower extremity edema.   Past Medical History:  Diagnosis Date  . Anxiety   . Asthma    in past, no inhaler now  . Benign neoplasm of colon   . Bone infection ([place])   . [facility])    bladder cancer  . COPD (chronic obstructive pulmonary disease) ([place])   . Depression   . Diverticulosis of colon (without mention of hemorrhage)   . DJD (degenerative joint disease)   . Early cataract   . Esophageal reflux   . Glaucoma   . Headache   . Hidradenitis   . History of BPH   . History of gynecomastia    Unilateral  . Hyperlipidemia   . Irritable bowel syndrome   . Memory loss   . Mitral stenosis    mild-moderate MS 01/2016  . MVP (mitral valve prolapse)   . Obesity, unspecified   . Pneumonia   . PONV (postoperative nausea and vomiting)   . Sleep apnea    does not use  cpap or bipap .. no study  ever  . Type II or unspecified type diabetes mellitus without mention of complication, not stated as uncontrolled   . Unspecified venous (peripheral) insufficiency   . Wears dentures   . Wears glasses     Past Surgical  History:  Procedure Laterality Date  . ACHILLES TENDON SURGERY Right 05/17/2019   Procedure: ACHILLES LENGTHENING/KIDNER;  Surgeon: Evelina Bucy, DPM;  Location: [facility];  Service: Podiatry;  Laterality: Right;  . AMPUTATION  08/09/2012   Procedure: AMPUTATION DIGIT;  Surgeon: Newt Minion, MD;  Location: [facility];  Service: Orthopedics;  Laterality: Right;  Amputation right Great Toe MTP Joint  . AMPUTATION  10/17/2012   Procedure: AMPUTATION DIGIT;  Surgeon: Newt Minion, MD;  Location: [facility];  Service: Orthopedics;  Laterality: Right;  Right Foot 3rd Toe Amputation at Metatarsophalangeal Joint  . AMPUTATION TOE Right 05/17/2019   Procedure: AMPUTATION TOE Metatarsal Phalangeal Joint   FOURTH AND FIFTH, FILLETED TOE FLAP;  Surgeon: Evelina Bucy, DPM;  Location: [facility];  Service: Podiatry;  Laterality: Right;  . CHOLECYSTECTOMY N/A 06/14/2018   Procedure: LAPAROSCOPIC CHOLECYSTECTOMY WITH INTRAOPERATIVE CHOLANGIOGRAM;  Surgeon: Kieth Brightly Arta Bruce, MD;  Location: [facility];  Service: General;  Laterality: N/A;  . CIRCUMCISION  06/2100   For Phimosis - Dr Hartley Barefoot  . COLON SURGERY    . CYSTOSCOPY W/ RETROGRADES Bilateral 07/07/2018  Procedure: CYSTOSCOPY WITH BILATERAL RETROGRADE PYELOGRAM;  Surgeon: Ardis Hughs, MD;  Location: [facility];  Service: Urology;  Laterality: Bilateral;  . KNEE ARTHROSCOPY    . LAPAROSCOPIC LYSIS OF ADHESIONS  06/14/2018   Procedure: LAPAROSCOPIC LYSIS OF ADHESIONS;  Surgeon: Kieth Brightly Arta Bruce, MD;  Location: [facility];  Service: General;;  . LAPAROTOMY  07/20/11  . LASER ABLATION  06/2009   Left Greater Saphenous vein -Dr Donnetta Hutching  . METATARSAL OSTEOTOMY Right 05/17/2019   Procedure: METATARSLSECTOMY FOURTH DIGIT;  Surgeon: Evelina Bucy, DPM;  Location: [facility];  Service: Podiatry;  Laterality: Right;  . MULTIPLE TOOTH EXTRACTIONS    . TOE AMPUTATION  06/2009   x3Right foot second toe -Dr Beola Cord  . TONSILLECTOMY    . TRANSURETHRAL RESECTION OF BLADDER TUMOR N/A  07/07/2018   Procedure: TRANSURETHRAL RESECTION OF BLADDER TUMOR (TURBT) WITH POST OP INSTILLATION OF GEMCITABIN;  Surgeon: Ardis Hughs, MD;  Location: [facility];  Service: Urology;  Laterality: N/A;     Current Outpatient Medications  Medication Sig Dispense Refill  . aspirin EC 81 MG tablet Take 1 tablet (81 mg total) by mouth daily. 90 tablet 3  . Blood Glucose Monitoring Suppl (ONETOUCH VERIO) w/Device KIT 1 Act by Does not apply route 3 (three) times daily. 1 kit 2  . collagenase (SANTYL) ointment Apply 1 application topically daily. Right foot ulcer measurement 3.0 x 2.0 x 0.1cm 30 g 5  . docusate sodium (COLACE) 100 MG capsule Take 1 capsule (100 mg total) by mouth 2 (two) times daily. (Patient taking differently: Take 100 mg by mouth 2 (two) times daily as needed (constipation.). ) 10 capsule 0  . Ertugliflozin L-PyroglutamicAc (STEGLATRO) 5 MG TABS Take 1 tablet by mouth daily. 90 tablet 1  . famotidine (PEPCID) 20 MG tablet Take 1 tablet (20 mg total) by mouth 2 (two) times daily. (Patient taking differently: Take 20 mg by mouth 2 (two) times daily as needed (heartburn/indigestion.). ) 10 tablet 0  . glimepiride (AMARYL) 2 MG tablet Take 1 tablet (2 mg total) by mouth daily with breakfast. -- Office visit needed for further refills 90 tablet 0  . glucose blood (TRUE METRIX BLOOD GLUCOSE TEST) test strip Use to test blood sugar twice daily. DX: E11.9 200 each 3  . metFORMIN (GLUCOPHAGE) 500 MG tablet Take 2 tablets (1,000 mg total) by mouth 2 (two) times daily. -- Office visit needed for further refills 360 tablet 0  . simvastatin (ZOCOR) 40 MG tablet Take 1 tablet (40 mg total) by mouth every evening. -- Office visit needed for further refills 90 tablet 0  . Skin Protectants, Misc. (EUCERIN) cream Apply 1 application topically as needed for dry skin (ear itching.).    Marland Kitchen TRUEplus Lancets 33G MISC Use to test blood sugar twice daily. DX: E11.9 200 each 3  . vitamin C (ASCORBIC ACID)  500 MG tablet Take 1,000 mg by mouth daily.    . Wound Dressings (MEDIHONEY WOUND/BURN DRESSING) GEL Apply to affected are 3 times a week, and cover with sterile dressing. 15 mL 2   No current facility-administered medications for this visit.     Allergies:   Bactrim [sulfamethoxazole-trimethoprim]    ROS:  Please see the history of present illness.   Otherwise, review of systems are positive for none.   All other systems are reviewed and negative.    PHYSICAL EXAM: VS:  BP 119/70   Pulse 76   Ht '6\' 8"'  (2.032 m)   Wt  297 lb (134.7 kg)   SpO2 97%   BMI 32.63 kg/m  , BMI Body mass index is 32.63 kg/m. GENERAL:  Well appearing NECK:  No jugular venous distention, waveform within normal limits, carotid upstroke brisk and symmetric, no bruits, no thyromegaly LUNGS:  Clear to auscultation bilaterally BACK:  No CVA tenderness CHEST:  Unremarkable HEART:  PMI not displaced or sustained,S1 and S2 within normal limits, no S3, no S4, no clicks, no rubs, 3 of 6 apical systolic murmur radiating to the axilla, holosystolic, no diastolic murmurs ABD:  Flat, positive bowel sounds normal in frequency in pitch, no bruits, no rebound, no guarding, no midline pulsatile mass, no hepatomegaly, no splenomegaly EXT:  2 plus pulses throughout, mild bilateral nonpitting lower extremity edema, no cyanosis no clubbing, right foot in soft boot.,  Chronic venous stasis changes   EKG:  EKG is not ordered today.    Recent Labs: 04/18/2019: TSH 0.85 05/17/2019: ALT 18 07/19/2019: BUN 17; Creatinine, Ser 1.07; Hemoglobin 13.0; Platelets 167.0; Potassium 4.1; Sodium 138    Lipid Panel    Component Value Date/Time   CHOL 137 04/18/2019 1035   TRIG 80.0 04/18/2019 1035   HDL 52.40 04/18/2019 1035   CHOLHDL 3 04/18/2019 1035   VLDL 16.0 04/18/2019 1035   LDLCALC 68 04/18/2019 1035      Wt Readings from Last 3 Encounters:  07/20/19 297 lb (134.7 kg)  07/19/19 297 lb (134.7 kg)  05/17/19 (!) 307 lb 9.6 oz  (139.5 kg)      Other studies Reviewed: Additional studies/ records that were reviewed today include: Labs. Review of the above records demonstrates:  Please see elsewhere in the note.     ASSESSMENT AND PLAN:  Mitral stenosis:   He has had mild to moderate mitral regurgitation as well.  He needs another echocardiogram.  He understands endocarditis prophylaxis.  No change in therapy.   Hyperlipidemia: LDL 68.  He will continue the meds as listed.   DM2:   A1c 9.0.  He needs to follow-up with Dr. Ronnald Ramp for continued management.   Preop: The patient will be acceptable risk for skin graft surgery.  No change in therapy or further testing is indicated.   Current medicines are reviewed at length with the patient today.  The patient does not have concerns regarding medicines.  The following changes have been made:  no change  Labs/ tests ordered today include:  No orders of the defined types were placed in this encounter.    Disposition:   FU with APP in one year.     Signed, Minus Breeding, MD  07/20/2019 11:05 AM    [place]

## 2019-07-19 ENCOUNTER — Other Ambulatory Visit (INDEPENDENT_AMBULATORY_CARE_PROVIDER_SITE_OTHER): Payer: HMO

## 2019-07-19 ENCOUNTER — Other Ambulatory Visit: Payer: Self-pay

## 2019-07-19 ENCOUNTER — Ambulatory Visit (INDEPENDENT_AMBULATORY_CARE_PROVIDER_SITE_OTHER): Payer: HMO | Admitting: Internal Medicine

## 2019-07-19 ENCOUNTER — Encounter: Payer: Self-pay | Admitting: Internal Medicine

## 2019-07-19 ENCOUNTER — Telehealth: Payer: Self-pay | Admitting: *Deleted

## 2019-07-19 VITALS — BP 140/60 | HR 80 | Temp 98.4°F | Resp 16 | Ht >= 80 in | Wt 297.0 lb

## 2019-07-19 DIAGNOSIS — D539 Nutritional anemia, unspecified: Secondary | ICD-10-CM

## 2019-07-19 DIAGNOSIS — Z23 Encounter for immunization: Secondary | ICD-10-CM

## 2019-07-19 DIAGNOSIS — I70209 Unspecified atherosclerosis of native arteries of extremities, unspecified extremity: Secondary | ICD-10-CM

## 2019-07-19 DIAGNOSIS — E1151 Type 2 diabetes mellitus with diabetic peripheral angiopathy without gangrene: Secondary | ICD-10-CM

## 2019-07-19 LAB — CBC WITH DIFFERENTIAL/PLATELET
Basophils Absolute: 0 10*3/uL (ref 0.0–0.1)
Basophils Relative: 0.5 % (ref 0.0–3.0)
Eosinophils Absolute: 0.5 10*3/uL (ref 0.0–0.7)
Eosinophils Relative: 7.9 % — ABNORMAL HIGH (ref 0.0–5.0)
HCT: 39.4 % (ref 39.0–52.0)
Hemoglobin: 13 g/dL (ref 13.0–17.0)
Lymphocytes Relative: 18 % (ref 12.0–46.0)
Lymphs Abs: 1.1 10*3/uL (ref 0.7–4.0)
MCHC: 33.1 g/dL (ref 30.0–36.0)
MCV: 95 fl (ref 78.0–100.0)
Monocytes Absolute: 0.5 10*3/uL (ref 0.1–1.0)
Monocytes Relative: 8.3 % (ref 3.0–12.0)
Neutro Abs: 3.8 10*3/uL (ref 1.4–7.7)
Neutrophils Relative %: 65.3 % (ref 43.0–77.0)
Platelets: 167 10*3/uL (ref 150.0–400.0)
RBC: 4.14 Mil/uL — ABNORMAL LOW (ref 4.22–5.81)
RDW: 12.4 % (ref 11.5–15.5)
WBC: 5.8 10*3/uL (ref 4.0–10.5)

## 2019-07-19 LAB — BASIC METABOLIC PANEL
BUN: 17 mg/dL (ref 6–23)
CO2: 27 mEq/L (ref 19–32)
Calcium: 9.5 mg/dL (ref 8.4–10.5)
Chloride: 102 mEq/L (ref 96–112)
Creatinine, Ser: 1.07 mg/dL (ref 0.40–1.50)
GFR: 81.02 mL/min (ref >60.00)
Glucose, Bld: 260 mg/dL — ABNORMAL HIGH (ref 70–99)
Potassium: 4.1 mEq/L (ref 3.5–5.1)
Sodium: 138 mEq/L (ref 135–145)

## 2019-07-19 LAB — FOLATE: Folate: >24.7 ng/mL (ref >5.9)

## 2019-07-19 LAB — IBC PANEL
Iron: 61 ug/dL (ref 42–165)
Saturation Ratios: 18.2 % — ABNORMAL LOW (ref 20.0–50.0)
Transferrin: 240 mg/dL (ref 212.0–360.0)

## 2019-07-19 LAB — HEMOGLOBIN A1C: Hgb A1c MFr Bld: 6.9 % — ABNORMAL HIGH (ref 4.6–6.5)

## 2019-07-19 LAB — FERRITIN: Ferritin: 54.5 ng/mL (ref 22.0–322.0)

## 2019-07-19 LAB — VITAMIN B12: Vitamin B-12: 357 pg/mL (ref 211–911)

## 2019-07-19 NOTE — Telephone Encounter (Signed)
DOS 07/25/2019 WOUND DEBRIDEMENT - 11043 AND SKIN GRAFT APPLICATION - [id number]  [employer]: Effective Date  Deductible - $0 Co-insurance - 80% / 20% Co-pay - $225 to facility Out of pocket - [id number]  Pre-certification is NOT REQUIRED  Call ref. # [id number]

## 2019-07-19 NOTE — Patient Instructions (Signed)
Anemia  Anemia is a condition in which you do not have enough red blood cells or hemoglobin. Hemoglobin is a substance in red blood cells that carries oxygen. When you do not have enough red blood cells or hemoglobin (are anemic), your body cannot get enough oxygen and your organs may not work properly. As a result, you may feel very tired or have other problems. What are the causes? Common causes of anemia include:  Excessive bleeding. Anemia can be caused by excessive bleeding inside or outside the body, including bleeding from the intestine or from periods in women.  Poor nutrition.  Long-lasting (chronic) kidney, thyroid, and liver disease.  Bone marrow disorders.  Cancer and treatments for cancer.  HIV (human immunodeficiency virus) and AIDS (acquired immunodeficiency syndrome).  Treatments for HIV and AIDS.  Spleen problems.  Blood disorders.  Infections, medicines, and autoimmune disorders that destroy red blood cells. What are the signs or symptoms? Symptoms of this condition include:  Minor weakness.  Dizziness.  Headache.  Feeling heartbeats that are irregular or faster than normal (palpitations).  Shortness of breath, especially with exercise.  Paleness.  Cold sensitivity.  Indigestion.  Nausea.  Difficulty sleeping.  Difficulty concentrating. Symptoms may occur suddenly or develop slowly. If your anemia is mild, you may not have symptoms. How is this diagnosed? This condition is diagnosed based on:  Blood tests.  Your medical history.  A physical exam.  Bone marrow biopsy. Your health care provider may also check your stool (feces) for blood and may do additional testing to look for the cause of your bleeding. You may also have other tests, including:  Imaging tests, such as a CT scan or MRI.  Endoscopy.  Colonoscopy. How is this treated? Treatment for this condition depends on the cause. If you continue to lose a lot of blood, you may  need to be treated at a hospital. Treatment may include:  Taking supplements of iron, vitamin S31, or folic acid.  Taking a hormone medicine (erythropoietin) that can help to stimulate red blood cell growth.  Having a blood transfusion. This may be needed if you lose a lot of blood.  Making changes to your diet.  Having surgery to remove your spleen. Follow these instructions at home:  Take over-the-counter and prescription medicines only as told by your health care provider.  Take supplements only as told by your health care provider.  Follow any diet instructions that you were given.  Keep all follow-up visits as told by your health care provider. This is important. Contact a health care provider if:  You develop new bleeding anywhere in the body. Get help right away if:  You are very weak.  You are short of breath.  You have pain in your abdomen or chest.  You are dizzy or feel faint.  You have trouble concentrating.  You have bloody or black, tarry stools.  You vomit repeatedly or you vomit up blood. Summary  Anemia is a condition in which you do not have enough red blood cells or enough of a substance in your red blood cells that carries oxygen (hemoglobin).  Symptoms may occur suddenly or develop slowly.  If your anemia is mild, you may not have symptoms.  This condition is diagnosed with blood tests as well as a medical history and physical exam. Other tests may be needed.  Treatment for this condition depends on the cause of the anemia. This information is not intended to replace advice given to you by  your health care provider. Make sure you discuss any questions you have with your health care provider. Document Released: 12/09/2004 Document Revised: 10/14/2017 Document Reviewed: 12/03/2016 Elsevier Patient Education  2020 [employer].

## 2019-07-19 NOTE — Progress Notes (Signed)
Subjective:  Patient ID: Jason [name], male    DOB: [birth year]  Age: 78 y.o. MRN: [id number]  CC: Anemia   HPI Jason [name] presents for f/up - He complains of a 3-week history of feeling dizzy and lightheaded.  He was anemic a few months ago.  Outpatient Medications Prior to Visit  Medication Sig Dispense Refill  . aspirin EC 81 MG tablet Take 1 tablet (81 mg total) by mouth daily. 90 tablet 3  . Blood Glucose Monitoring Suppl (ONETOUCH VERIO) w/Device KIT 1 Act by Does not apply route 3 (three) times daily. 1 kit 2  . collagenase (SANTYL) ointment Apply 1 application topically daily. Right foot ulcer measurement 3.0 x 2.0 x 0.1cm 30 g 5  . docusate sodium (COLACE) 100 MG capsule Take 1 capsule (100 mg total) by mouth 2 (two) times daily. (Patient taking differently: Take 100 mg by mouth 2 (two) times daily as needed (constipation.). ) 10 capsule 0  . Ertugliflozin L-PyroglutamicAc (STEGLATRO) 5 MG TABS Take 1 tablet by mouth daily. 90 tablet 1  . famotidine (PEPCID) 20 MG tablet Take 1 tablet (20 mg total) by mouth 2 (two) times daily. (Patient taking differently: Take 20 mg by mouth 2 (two) times daily as needed (heartburn/indigestion.). ) 10 tablet 0  . glimepiride (AMARYL) 2 MG tablet Take 1 tablet (2 mg total) by mouth daily with breakfast. -- Office visit needed for further refills 90 tablet 0  . glucose blood (TRUE METRIX BLOOD GLUCOSE TEST) test strip Use to test blood sugar twice daily. DX: E11.9 200 each 3  . metFORMIN (GLUCOPHAGE) 500 MG tablet Take 2 tablets (1,000 mg total) by mouth 2 (two) times daily. -- Office visit needed for further refills 360 tablet 0  . simvastatin (ZOCOR) 40 MG tablet Take 1 tablet (40 mg total) by mouth every evening. -- Office visit needed for further refills 90 tablet 0  . Skin Protectants, Misc. (EUCERIN) cream Apply 1 application topically as needed for dry skin (ear itching.).    Marland Kitchen TRUEplus Lancets 33G MISC Use to test blood sugar twice  daily. DX: E11.9 200 each 3  . vitamin C (ASCORBIC ACID) 500 MG tablet Take 1,000 mg by mouth daily.    . Wound Dressings (MEDIHONEY WOUND/BURN DRESSING) GEL Apply to affected are 3 times a week, and cover with sterile dressing. 15 mL 2  . clindamycin (CLEOCIN) 300 MG capsule Take 1 capsule (300 mg total) by mouth 3 (three) times daily. 21 capsule 0  . oxyCODONE-acetaminophen (PERCOCET) 5-325 MG tablet Take 1 tablet by mouth every 4 (four) hours as needed for severe pain. 20 tablet 0   No facility-administered medications prior to visit.     ROS Review of Systems  Constitutional: Negative for diaphoresis and fatigue.  HENT: Negative.   Eyes: Negative for visual disturbance.  Respiratory: Negative for cough, chest tightness, shortness of breath and wheezing.   Cardiovascular: Negative for chest pain, palpitations and leg swelling.  Gastrointestinal: Negative for abdominal pain, blood in stool, diarrhea, nausea and vomiting.  Endocrine: Negative.  Negative for polydipsia, polyphagia and polyuria.  Genitourinary: Negative.  Negative for difficulty urinating.  Musculoskeletal: Negative.  Negative for myalgias.  Skin: Negative.  Negative for color change and pallor.  Neurological: Positive for dizziness and light-headedness. Negative for weakness.  Hematological: Negative for adenopathy. Does not bruise/bleed easily.  Psychiatric/Behavioral: Negative.     Objective:  BP 140/60 (BP Location: Left Arm, Patient Position: Sitting, Cuff Size: Large)  Pulse 80   Temp 98.4 F (36.9 C) (Oral)   Resp 16   Ht _0  (2.032 m)   Wt 297 lb (134.7 kg)   SpO2 95%   BMI 32.63 kg/m   BP Readings from Last 3 Encounters:  07/20/19 119/70  07/19/19 140/60  05/17/19 136/83    Wt Readings from Last 3 Encounters:  07/20/19 297 lb (134.7 kg)  07/19/19 297 lb (134.7 kg)  05/17/19 (!) 307 lb 9.6 oz (139.5 kg)    Physical Exam Vitals signs reviewed.  Constitutional:      General: He is not in  acute distress.    Appearance: He is obese. He is not ill-appearing, toxic-appearing or diaphoretic.  HENT:     Nose: Nose normal.     Mouth/Throat:     Mouth: Mucous membranes are moist.     Pharynx: No oropharyngeal exudate.  Eyes:     General: No scleral icterus.    Conjunctiva/sclera: Conjunctivae normal.  Neck:     Musculoskeletal: Normal range of motion and neck supple.  Cardiovascular:     Rate and Rhythm: Normal rate and regular rhythm.     Heart sounds: Murmur present. Systolic murmur present with a grade of 2/6. No diastolic murmur. No gallop.   Pulmonary:     Effort: Pulmonary effort is normal.     Breath sounds: No stridor. No wheezing, rhonchi or rales.  Abdominal:     General: Abdomen is protuberant. Bowel sounds are normal.     Palpations: There is no hepatomegaly or splenomegaly.     Tenderness: There is no abdominal tenderness.  Musculoskeletal:     Right lower leg: No edema.     Left lower leg: No edema.  Lymphadenopathy:     Cervical: No cervical adenopathy.  Skin:    General: Skin is warm and dry.     Coloration: Skin is not pale.  Neurological:     General: No focal deficit present.     Mental Status: He is alert and oriented to person, place, and time. Mental status is at baseline.  Psychiatric:        Mood and Affect: Mood normal.        Behavior: Behavior normal.     Lab Results  Component Value Date   WBC 5.8 07/19/2019   HGB 13.0 07/19/2019   HCT 39.4 07/19/2019   PLT 167.0 07/19/2019   GLUCOSE 260 (H) 07/19/2019   CHOL 137 04/18/2019   TRIG 80.0 04/18/2019   HDL 52.40 04/18/2019   LDLCALC 68 04/18/2019   ALT 18 05/17/2019   AST 19 05/17/2019   NA 138 07/19/2019   K 4.1 07/19/2019   CL 102 07/19/2019   CREATININE 1.07 07/19/2019   BUN 17 07/19/2019   CO2 27 07/19/2019   TSH 0.85 04/18/2019   PSA 1.13 04/18/2019   INR 1.05 10/17/2012   HGBA1C 6.9 (H) 07/19/2019   MICROALBUR 11.3 (H) 04/18/2019    No results found.  Assessment  & Plan:   Jason [name] was seen today for anemia.  Diagnoses and all orders for this visit:  Need for influenza vaccination -     Flu Vaccine QUAD High Dose(Fluad)  Deficiency anemia- His H&H and vitamin levels are normal now.  I do not see any serious finding to explain his dizziness and lightheadedness.  From my standpoint he is cleared for skin graft on his right foot. -     CBC with Differential/Platelet; Future -  Reticulocytes; Future -     Vitamin B12; Future -     Ferritin; Future -     Folate; Future -     IBC panel; Future -     Vitamin B1; Future  Diabetes mellitus type 2 with atherosclerosis of arteries of extremities ([place])- His A1c is at 6.9%.  His blood sugars are adequately well controlled. -     Basic metabolic panel; Future -     Hemoglobin A1c; Future   I am having Erling Conte. Raap maintain his aspirin EC, docusate sodium, famotidine, Ertugliflozin L-PyroglutamicAc, TRUEplus Lancets 33G, glucose blood, OneTouch Verio, eucerin, vitamin C, simvastatin, metFORMIN, glimepiride, Santyl, and Medihoney Wound/Burn Dressing.  No orders of the defined types were placed in this encounter.    Follow-up: Return in about 3 weeks (around 08/09/2019).  Scarlette Calico, MD

## 2019-07-20 ENCOUNTER — Ambulatory Visit: Payer: HMO | Admitting: Cardiology

## 2019-07-20 ENCOUNTER — Encounter: Payer: Self-pay | Admitting: Cardiology

## 2019-07-20 ENCOUNTER — Telehealth: Payer: Self-pay | Admitting: *Deleted

## 2019-07-20 VITALS — BP 119/70 | HR 76 | Ht >= 80 in | Wt 297.0 lb

## 2019-07-20 DIAGNOSIS — E785 Hyperlipidemia, unspecified: Secondary | ICD-10-CM | POA: Diagnosis not present

## 2019-07-20 DIAGNOSIS — I70209 Unspecified atherosclerosis of native arteries of extremities, unspecified extremity: Secondary | ICD-10-CM | POA: Diagnosis not present

## 2019-07-20 DIAGNOSIS — E1151 Type 2 diabetes mellitus with diabetic peripheral angiopathy without gangrene: Secondary | ICD-10-CM

## 2019-07-20 DIAGNOSIS — I050 Rheumatic mitral stenosis: Secondary | ICD-10-CM

## 2019-07-20 NOTE — Patient Instructions (Addendum)
Medication Instructions:  Your physician recommends that you continue on your current medications as directed. Please refer to the Current Medication list given to you today.  If you need a refill on your cardiac medications before your next appointment, please call your pharmacy.   Lab work: NONE   Testing/Procedures: Your physician has requested that you have an echocardiogram. Echocardiography is a painless test that uses sound waves to create images of your heart. It provides your doctor with information about the size and shape of your heart and how well your heart's chambers and valves are working. This procedure takes approximately one hour. There are no restrictions for this procedure. [address]  Follow-Up: Your physician wants you to follow-up in: [place] You will receive a reminder letter in the mail two months in advance. If you don't receive a letter, please call our office to schedule the follow-up appointment.   Any Other Special Instructions Will Be Listed Below (If Applicable).   Echocardiogram An echocardiogram is a procedure that uses painless sound waves (ultrasound) to produce an image of the heart. Images from an echocardiogram can provide important information about:  Signs of coronary artery disease (CAD).  Aneurysm detection. An aneurysm is a weak or damaged part of an artery wall that bulges out from the normal force of blood pumping through the body.  Heart size and shape. Changes in the size or shape of the heart can be associated with certain conditions, including heart failure, aneurysm, and CAD.  Heart muscle function.  Heart valve function.  Signs of a past heart attack.  Fluid buildup around the heart.  Thickening of the heart muscle.  A tumor or infectious growth around the heart valves. Tell a health care provider about:  Any allergies you have.  All medicines you are taking, including  vitamins, herbs, eye drops, creams, and over-the-counter medicines.  Any blood disorders you have.  Any surgeries you have had.  Any medical conditions you have.  Whether you are pregnant or may be pregnant. What are the risks? Generally, this is a safe procedure. However, problems may occur, including:  Allergic reaction to dye (contrast) that may be used during the procedure. What happens before the procedure? No specific preparation is needed. You may eat and drink normally. What happens during the procedure?   An IV tube may be inserted into one of your veins.  You may receive contrast through this tube. A contrast is an injection that improves the quality of the pictures from your heart.  A gel will be applied to your chest.  A wand-like tool (transducer) will be moved over your chest. The gel will help to transmit the sound waves from the transducer.  The sound waves will harmlessly bounce off of your heart to allow the heart images to be captured in real-time motion. The images will be recorded on a computer. The procedure may vary among health care providers and hospitals. What happens after the procedure?  You may return to your normal, everyday life, including diet, activities, and medicines, unless your health care provider tells you not to do that. Summary  An echocardiogram is a procedure that uses painless sound waves (ultrasound) to produce an image of the heart.  Images from an echocardiogram can provide important information about the size and shape of your heart, heart muscle function, heart valve function, and fluid buildup around your heart.  You do not need  to do anything to prepare before this procedure. You may eat and drink normally.  After the echocardiogram is completed, you may return to your normal, everyday life, unless your health care provider tells you not to do that. This information is not intended to replace advice given to you by your health  care provider. Make sure you discuss any questions you have with your health care provider. Document Released: 10/29/2000 Document Revised: 02/22/2019 Document Reviewed: 12/04/2016 Elsevier Patient Education  2020 [employer].

## 2019-07-20 NOTE — Telephone Encounter (Signed)
I left Jason [name] a message that we were changing the location for his surgery from [place] to [facility] on 07/25/2019.  I informed him it will be an earlier time slot.  I called Sharee Pimple at central scheduling and rescheduled Jason [name] to [facility].  His surgery will start at 10:30 am.

## 2019-07-20 NOTE — Telephone Encounter (Signed)
Thanks

## 2019-07-21 ENCOUNTER — Other Ambulatory Visit (HOSPITAL_COMMUNITY): Payer: HMO

## 2019-07-21 ENCOUNTER — Other Ambulatory Visit (HOSPITAL_COMMUNITY)
Admission: RE | Admit: 2019-07-21 | Discharge: 2019-07-21 | Disposition: A | Discharge status: Home / Self Care | Payer: HMO | Source: Ambulatory Visit | Attending: Podiatry | Admitting: Podiatry

## 2019-07-21 DIAGNOSIS — Z20828 Contact with and (suspected) exposure to other viral communicable diseases: Secondary | ICD-10-CM | POA: Diagnosis not present

## 2019-07-21 DIAGNOSIS — Z01812 Encounter for preprocedural laboratory examination: Secondary | ICD-10-CM | POA: Insufficient documentation

## 2019-07-22 LAB — RETICULOCYTES
ABS Retic: 61200 {cells}/uL (ref 25000–9000)
Retic Ct Pct: 1.5 %

## 2019-07-22 LAB — NOVEL CORONAVIRUS, NAA (HOSP ORDER, SEND-OUT TO REF LAB; TAT 18-24 HRS): SARS-CoV-2, NAA: NOT DETECTED

## 2019-07-22 LAB — VITAMIN B1: Vitamin B1 (Thiamine): 18 nmol/L (ref 8–30)

## 2019-07-24 ENCOUNTER — Encounter (HOSPITAL_COMMUNITY): Payer: Self-pay

## 2019-07-24 ENCOUNTER — Other Ambulatory Visit: Payer: Self-pay

## 2019-07-24 ENCOUNTER — Encounter (HOSPITAL_COMMUNITY)
Admission: RE | Admit: 2019-07-24 | Discharge: 2019-07-24 | Disposition: A | Discharge status: Home / Self Care | Payer: HMO | Source: Ambulatory Visit | Attending: Podiatry | Admitting: Podiatry

## 2019-07-24 DIAGNOSIS — L97519 Non-pressure chronic ulcer of other part of right foot with unspecified severity: Secondary | ICD-10-CM | POA: Diagnosis not present

## 2019-07-24 DIAGNOSIS — E1151 Type 2 diabetes mellitus with diabetic peripheral angiopathy without gangrene: Secondary | ICD-10-CM | POA: Diagnosis not present

## 2019-07-24 DIAGNOSIS — G4730 Sleep apnea, unspecified: Secondary | ICD-10-CM | POA: Diagnosis not present

## 2019-07-24 DIAGNOSIS — J449 Chronic obstructive pulmonary disease, unspecified: Secondary | ICD-10-CM | POA: Diagnosis not present

## 2019-07-24 DIAGNOSIS — Z87891 Personal history of nicotine dependence: Secondary | ICD-10-CM | POA: Diagnosis not present

## 2019-07-24 DIAGNOSIS — E11621 Type 2 diabetes mellitus with foot ulcer: Secondary | ICD-10-CM | POA: Diagnosis not present

## 2019-07-24 LAB — GLUCOSE, CAPILLARY: Glucose-Capillary: 353 mg/dL — ABNORMAL HIGH (ref 70–99)

## 2019-07-24 MED ORDER — DEXTROSE 5 % IV SOLN
3.0000 g | INTRAVENOUS | Status: AC
  Administered 2019-07-25: 12:00:00 3 g via INTRAVENOUS
  Filled 2019-07-24: qty 3

## 2019-07-24 NOTE — Patient Instructions (Addendum)
DUE TO COVID-19 ONLY ONE VISITOR IS ALLOWED TO COME WITH YOU AND STAY IN THE WAITING ROOM ONLY DURING PRE OP AND PROCEDURE DAY OF SURGERY. THE 1 VISITOR MAY VISIT WITH YOU AFTER SURGERY IN YOUR PRIVATE ROOM DURING VISITING HOURS ONLY!  YOU HAD  A COVID 19 TEST ON 07-21-2019. ONCE YOUR COVID TEST IS COMPLETED, PLEASE BEGIN THE QUARANTINE INSTRUCTIONS AS OUTLINED IN YOUR HANDOUT.                Jason [name]    Your procedure is scheduled on: 07-25-2019  Report to [facility] Main  Entrance   Report to admitting at  830 AM     Call this number if you have problems the morning of surgery [phone]    Remember: [name], NO [place].   NO SOLID FOOD AFTER MIDNIGHT THE NIGHT PRIOR TO SURGERY. NOTHING BY MOUTH EXCEPT CLEAR LIQUIDS UNTIL 730 AM. .PLEASE FINISH G2  DRINK PER SURGEON ORDER  WHICH NEEDS TO BE COMPLETED AT 730 AM .   CLEAR LIQUID DIET   Foods Allowed                                                                     Foods Excluded  Coffee and tea, regular and decaf                             liquids that you cannot  Plain Jell-O any favor except red or purple                                           see through such as: Fruit ices (not with fruit pulp)                                     milk, soups, orange juice  Iced Popsicles                                    All solid food Carbonated beverages, regular and diet                                    Cranberry, grape and apple juices Sports drinks like Gatorade Lightly seasoned clear broth or consume(fat free) Sugar, honey syrup  Sample Menu Breakfast                                Lunch                                     Supper Cranberry juice  Beef broth                            Chicken broth Jell-O                                     Grape juice                           Apple juice Coffee or tea                         Jell-O                                      Popsicle                                                Coffee or tea                        Coffee or tea  _____________________________________________________________________     Take these medicines the morning of surgery with A SIP OF WATER: NONE  How to Manage Your Diabetes Before and After Surgery  Why is it important to control my blood sugar before and after surgery? . Improving blood sugar levels before and after surgery helps healing and can limit problems. . A way of improving blood sugar control is eating a healthy diet by: o  Eating less sugar and carbohydrates o  Increasing activity/exercise o  Talking with your doctor about reaching your blood sugar goals . High blood sugars (greater than 180 mg/dL) can raise your risk of infections and slow your recovery, so you will need to focus on controlling your diabetes during the weeks before surgery. . Make sure that the doctor who takes care of your diabetes knows about your planned surgery including the date and location.  How do I manage my blood sugar before surgery? . Check your blood sugar at least 4 times a day, starting 2 days before surgery, to make sure that the level is not too high or low. o Check your blood sugar the morning of your surgery when you wake up and every 2 hours until you get to the Short Stay unit. . If your blood sugar is less than 70 mg/dL, you will need to treat for low blood sugar: o Do not take insulin. o Treat a low blood sugar (less than 70 mg/dL) with  cup of clear juice (cranberry or apple), 4 glucose tablets, OR glucose gel. o Recheck blood sugar in 15 minutes after treatment (to make sure it is greater than 70 mg/dL). If your blood sugar is not greater than 70 mg/dL on recheck, call [phone] for further instructions. . Report your blood sugar to the short stay nurse when you get to Short Stay.  . If you are admitted to the hospital after  surgery: o Your blood sugar will be checked by the staff and you will probably be given insulin after surgery (instead of oral diabetes medicines) to make sure you have good blood sugar levels. o  The goal for blood sugar control after surgery is 80-180 mg/dL.   WHAT DO I DO ABOUT MY DIABETES MEDICATION?  Marland Kitchen Do not take oral diabetes medicines (pills) the morning of surgery.  . THE DAY BEFORE  SURGERY DO NOT TAKE STEGLATRO     . THE MORNING OF SURGERY DO NOT TAKE STEGLARTO .   Marland Kitchen  THE DAY BEFORE SURGERY TAKE MORNING DOSE OF GLIMPERIDE..  .  THE DAY OF SURGERY DO NOT TAKE GLIMPERIDE. .   THE DAY BEFORE SURGERY TAKE METFORMIN AS USUAL.. THE DAY OF SURGERY DO NOT TAKE METFORMIN.    DO NOT TAKE ANY DIABETIC MEDICATIONS DAY OF YOUR SURGERY                               You may not have any metal on your body including hair pins and              piercings  Do not wear jewelry, make-up, lotions, powders or perfumes, deodorant             Do not wear nail polish.  Do not shave  48 hours prior to surgery.              Men may shave face and neck.   Do not bring valuables to the hospital. [name].  Contacts, dentures or bridgework may not be worn into surgery.  Leave suitcase in the car. After surgery it may be brought to your room.     Patients discharged the day of surgery will not be allowed to drive home. IF YOU ARE HAVING SURGERY AND GOING HOME THE SAME DAY, YOU MUST HAVE AN ADULT TO DRIVE YOU HOME AND BE WITH YOU FOR 24 HOURS. YOU MAY GO HOME BY TAXI OR UBER OR ORTHERWISE, BUT AN ADULT MUST ACCOMPANY YOU HOME AND STAY WITH YOU FOR 24 HOURS.  Name and phone number of your driver: [name] [phone]  Special Instructions: N/A              Please read over the following fact sheets you were given: _____________________________________________________________________             [facility] - Preparing for Surgery Before  surgery, you can play an important role.  Because skin is not sterile, your skin needs to be as free of germs as possible.  You can reduce the number of germs on your skin by washing with CHG (chlorahexidine gluconate) soap before surgery.  CHG is an antiseptic cleaner which kills germs and bonds with the skin to continue killing germs even after washing. Please DO NOT use if you have an allergy to CHG or antibacterial soaps.  If your skin becomes reddened/irritated stop using the CHG and inform your nurse when you arrive at Short Stay. Do not shave (including legs and underarms) for at least 48 hours prior to the first CHG shower.  You may shave your face/neck. Please follow these instructions carefully:  1.  Shower with CHG Soap the night before surgery and the  morning of Surgery.  2.  If you choose to wash your hair, wash your hair first as usual with your  normal  shampoo.  3.  After you shampoo, rinse your hair and body thoroughly to remove the  shampoo.  4.  Use CHG as you would any other liquid soap.  You can apply chg directly  to the skin and wash                       Gently with a scrungie or clean washcloth.  5.  Apply the CHG Soap to your body ONLY FROM THE NECK DOWN.   Do not use on face/ open                           Wound or open sores. Avoid contact with eyes, ears mouth and genitals (private parts).                       Wash face,  Genitals (private parts) with your normal soap.             6.  Wash thoroughly, paying special attention to the area where your surgery  will be performed.  7.  Thoroughly rinse your body with warm water from the neck down.  8.  DO NOT shower/wash with your normal soap after using and rinsing off  the CHG Soap.                9.  Pat yourself dry with a clean towel.            10.  Wear clean pajamas.            11.  Place clean sheets on your bed the night of your first shower and do not  sleep with pets. Day of Surgery  : Do not apply any lotions/deodorants the morning of surgery.  Please wear clean clothes to the hospital/surgery center.  FAILURE TO FOLLOW THESE INSTRUCTIONS MAY RESULT IN THE CANCELLATION OF YOUR SURGERY PATIENT SIGNATURE_________________________________  NURSE SIGNATURE__________________________________  ________________________________________________________________________

## 2019-07-24 NOTE — Anesthesia Preprocedure Evaluation (Addendum)
Anesthesia Evaluation  Patient identified by MRN, date of birth, ID band Patient awake    Reviewed: Allergy & Precautions, NPO status , Patient's Chart, lab work & pertinent test results  History of Anesthesia Complications Negative for: history of anesthetic complications  Airway Mallampati: II  TM Distance: >3 FB Neck ROM: Full    Dental  (+) Edentulous Upper, Missing, Poor Dentition   Pulmonary asthma , sleep apnea , COPD, former smoker,    Pulmonary exam normal        Cardiovascular + Peripheral Vascular Disease  Normal cardiovascular exam+ Valvular Problems/Murmurs (mild-mod MS)      Neuro/Psych negative neurological ROS  negative psych ROS   GI/Hepatic Neg liver ROS, GERD  ,  Endo/Other  diabetes, Type 2, Oral Hypoglycemic Agents  Renal/GU negative Renal ROS  negative genitourinary   Musculoskeletal negative musculoskeletal ROS (+)   Abdominal   Peds  Hematology negative hematology ROS (+)   Anesthesia Other Findings   Reproductive/Obstetrics                          Anesthesia Physical Anesthesia Plan  ASA: III  Anesthesia Plan: MAC   Post-op Pain Management:    Induction: Intravenous  PONV Risk Score and Plan: 1 and Propofol infusion, TIVA and Treatment may vary due to age or medical condition  Airway Management Planned: Natural Airway, Nasal Cannula and Simple Face Mask  Additional Equipment: None  Intra-op Plan:   Post-operative Plan:   Informed Consent: I have reviewed the patients History and Physical, chart, labs and discussed the procedure including the risks, benefits and alternatives for the proposed anesthesia with the patient or authorized representative who has indicated his/her understanding and acceptance.       Plan Discussed with:   Anesthesia Plan Comments: (See PAT note 07/24/2019, Konrad Felix, PA-C)      Anesthesia Quick Evaluation

## 2019-07-24 NOTE — Progress Notes (Addendum)
PCP -  DR Scarlette Calico Cardiologist -  DR HOCHREIN LOV 07-20-19 EPIC  Chest x-ray - NONE EKG - 05-14-19 EPIC Stress Test - 01-14-16 EPIC ECHO - NONE Cardiac Cath - NONE  Sleep Study - YRS AGO NO CPAP USED COULD NOT TOLERATE CPAP -   Fasting Blood Sugar - 110 Checks Blood Sugar 1 time a day CBG AT PRE OP 353  Blood Thinner Instructions: Aspirin Instructions: 81 MG NO INSTRUCTIONS GIVEN Last Dose: 07-24-19  Anesthesia review: CHART TO JESSICA ZANETTO PA FOR REVIEW  Patient denies shortness of breath, fever, cough and chest pain at PAT appointment   Patient verbalized understanding of instructions that were given to them at the PAT appointment. Patient was also instructed that they will need to review over the PAT instructions again at home before surgery.

## 2019-07-24 NOTE — Progress Notes (Signed)
HEMAGLOBIN A1C 07-19-19 (6.9) EPIC CBC WITH DIF 07-19-19 EPIC

## 2019-07-24 NOTE — Progress Notes (Signed)
Anesthesia Chart Review   Case: [id number] Date/Time: 07/25/19 1015   Procedure: Right foot wound debridement with application of skin graft substitute (Right )   Anesthesia type: Monitor Anesthesia Care   Diagnosis: Chronic ulcer of toe, right, with necrosis of bone ([place]) [L97.514]   Pre-op diagnosis: Right foot wound   Location: [facility] ROOM 01 / [facility]   Surgeon: Evelina Bucy, DPM      DISCUSSION:78 y.o. former smoker (0.5 pack years, quit 6/25/2-16) with h/o esophageal reflux, asthma, HLD, COPD, DM II, sleep apnea w/o device, MVP,  mild to moderate mitral stenosis, chronic ulcer of right toe with necrosis scheduled for above procedure 07/25/2019 with Dr. Hardie Pulley.   S/p to amputation 05/17/2019 with no anesthesia complications noted.   Last seen by cardiologist, Dr. Minus Breeding, 07/20/2019.  Per OV note, "The patient will be acceptable risk for skin graft surgery.  No change in therapy or further testing is indicated."  Anticipate pt can proceed with planned procedure barring acute status change.   VS: BP (!) 139/59   Pulse 76   Temp 36.7 C (Oral)   Resp 18   Ht '6\' 8"'  (2.032 m)   Wt 135.2 kg   SpO2 98%   BMI 32.74 kg/m   PROVIDERS: [name], MD  Minus Breeding, MD is Cardiologist  LABS: Labs reviewed: Acceptable for surgery. and will evaluate CBG DOS, A1C 6.9 on 07/19/2019 (all labs ordered are listed, but only abnormal results are displayed)  Labs Reviewed  GLUCOSE, CAPILLARY - Abnormal; Notable for the following components:      Result Value   Glucose-Capillary 353 (*)    All other components within normal limits     IMAGES:   EKG: 05/14/2019 Rate 70 bpm NSR, LVH No significant ST-T wave changes  CV: Echo 01/14/2016 Study Conclusions  - Left ventricle: The cavity size was normal. There was mild   concentric hypertrophy. Systolic function was normal. Wall motion   was normal; there were no regional wall motion abnormalities. - Aortic valve:  Trileaflet; moderately thickened, moderately   calcified leaflets. Valve mobility was restricted. There was no   regurgitation. - Aortic root: The aortic root was normal in size. - Mitral valve: Moderately thickened, moderately calcified leaflets   . Leaflet separation was moderately reduced. The findings are   consistent with mild to moderate stenosis. There was mild   regurgitation. Valve area by continuity equation (using LVOT   flow): 1.12 cm^2. - Left atrium: The atrium was moderately dilated. - Right ventricle: The cavity size was normal. Wall thickness was   normal. Systolic function was normal. - Right atrium: The atrium was normal in size. - Tricuspid valve: There was trivial regurgitation. - Pulmonary arteries: Systolic pressure was within the normal   range. - Inferior vena cava: The vessel was normal in size. - Pericardium, extracardiac: There was no pericardial effusion. Past Medical History:  Diagnosis Date  . Anxiety   . Asthma    in past, no inhaler now  . Benign neoplasm of colon   . Bone infection ([place])   . Cancer [facility]    bladder cancer  . COPD (chronic obstructive pulmonary disease) ([place])   . Depression   . Diverticulosis of colon (without mention of hemorrhage)   . DJD (degenerative joint disease)   . Early cataract   . Esophageal reflux   . Glaucoma   . Headache   . Hidradenitis   . History of BPH   .  History of gynecomastia    Unilateral  . Hyperlipidemia   . Irritable bowel syndrome   . Memory loss   . Mitral stenosis    mild-moderate MS 01/2016  . MVP (mitral valve prolapse)   . Obesity, unspecified   . Pneumonia   . Sleep apnea    does not use  cpap or bipap .. no study  ever  . Type II or unspecified type diabetes mellitus without mention of complication, not stated as uncontrolled   . Unspecified venous (peripheral) insufficiency   . Wears dentures   . Wears glasses   . Wound drainage    RIGHT FOOT X 1 WEEK CHANGES DRESSING Q DAY WITH  RED DRAINAGE    Past Surgical History:  Procedure Laterality Date  . ACHILLES TENDON SURGERY Right 05/17/2019   Procedure: ACHILLES LENGTHENING/KIDNER;  Surgeon: Evelina Bucy, DPM;  Location: [facility];  Service: Podiatry;  Laterality: Right;  . AMPUTATION  08/09/2012   Procedure: AMPUTATION DIGIT;  Surgeon: Newt Minion, MD;  Location: [facility];  Service: Orthopedics;  Laterality: Right;  Amputation right Great Toe MTP Joint  . AMPUTATION  10/17/2012   Procedure: AMPUTATION DIGIT;  Surgeon: Newt Minion, MD;  Location: [place];  Service: Orthopedics;  Laterality: Right;  Right Foot 3rd Toe Amputation at Metatarsophalangeal Joint  . AMPUTATION TOE Right 05/17/2019   Procedure: AMPUTATION TOE Metatarsal Phalangeal Joint   FOURTH AND FIFTH, FILLETED TOE FLAP;  Surgeon: Evelina Bucy, DPM;  Location: [place];  Service: Podiatry;  Laterality: Right;  . CHOLECYSTECTOMY N/A 06/14/2018   Procedure: LAPAROSCOPIC CHOLECYSTECTOMY WITH INTRAOPERATIVE CHOLANGIOGRAM;  Surgeon: Kieth Brightly Arta Bruce, MD;  [facility];  Service: General;  Laterality: N/A;  . CIRCUMCISION  06/2100   For Phimosis - Dr Hartley Barefoot  . COLON SURGERY    . CYSTOSCOPY W/ RETROGRADES Bilateral 07/07/2018   Procedure: CYSTOSCOPY WITH BILATERAL RETROGRADE PYELOGRAM;  Surgeon: Ardis Hughs, MD;  [facility];  Service: Urology;  Laterality: Bilateral;  . KNEE ARTHROSCOPY    . LAPAROSCOPIC LYSIS OF ADHESIONS  06/14/2018   Procedure: LAPAROSCOPIC LYSIS OF ADHESIONS;  Surgeon: Kieth Brightly Arta Bruce, MD;  [facility];  Service: General;;  . LAPAROTOMY  07/20/11  . LASER ABLATION  06/2009   Left Greater Saphenous vein -Dr Donnetta Hutching  . METATARSAL OSTEOTOMY Right 05/17/2019   Procedure: METATARSLSECTOMY FOURTH DIGIT;  Surgeon: Evelina Bucy, DPM;  Location: [place];  Service: Podiatry;  Laterality: Right;  . MULTIPLE TOOTH EXTRACTIONS    . TOE AMPUTATION  06/2009   x3Right foot second toe -Dr Beola Cord  . TONSILLECTOMY    . TRANSURETHRAL  RESECTION OF BLADDER TUMOR N/A 07/07/2018   Procedure: TRANSURETHRAL RESECTION OF BLADDER TUMOR (TURBT) WITH POST OP INSTILLATION OF GEMCITABIN;  Surgeon: Ardis Hughs, MD;  [facility];  Service: Urology;  Laterality: N/A;    MEDICATIONS: . Multiple Vitamins-Minerals (MULTIVITAMIN WITH MINERALS) tablet  . aspirin EC 81 MG tablet  . Blood Glucose Monitoring Suppl (ONETOUCH VERIO) w/Device KIT  . collagenase (SANTYL) ointment  . docusate sodium (COLACE) 100 MG capsule  . Ertugliflozin L-PyroglutamicAc (STEGLATRO) 5 MG TABS  . famotidine (PEPCID) 20 MG tablet  . glimepiride (AMARYL) 2 MG tablet  . glucose blood (TRUE METRIX BLOOD GLUCOSE TEST) test strip  . metFORMIN (GLUCOPHAGE) 500 MG tablet  . simvastatin (ZOCOR) 40 MG tablet  . Skin Protectants, Misc. (EUCERIN) cream  . TRUEplus Lancets 33G MISC  . vitamin C (ASCORBIC ACID) 500  MG tablet  . Wound Dressings (MEDIHONEY WOUND/BURN DRESSING) GEL   No current facility-administered medications for this encounter.    Derrill Memo ON 07/25/2019] ceFAZolin (ANCEF) 3 g in dextrose 5 % 50 mL IVPB    Maia Plan [facility] Pre-Surgical Testing [phone] 07/24/19 4:18 PM

## 2019-07-25 ENCOUNTER — Ambulatory Visit (HOSPITAL_COMMUNITY): Payer: HMO | Admitting: Anesthesiology

## 2019-07-25 ENCOUNTER — Ambulatory Visit (HOSPITAL_COMMUNITY)
Admission: RE | Admit: 2019-07-25 | Discharge: 2019-07-25 | Disposition: A | Discharge status: Home / Self Care | Payer: HMO | Attending: Podiatry | Admitting: Podiatry

## 2019-07-25 ENCOUNTER — Encounter (HOSPITAL_COMMUNITY)
Admission: RE | Disposition: A | Discharge status: Home / Self Care | Payer: Self-pay | Source: Home / Self Care | Attending: Podiatry

## 2019-07-25 ENCOUNTER — Ambulatory Visit (HOSPITAL_COMMUNITY): Payer: HMO | Admitting: Physician Assistant

## 2019-07-25 ENCOUNTER — Encounter (HOSPITAL_COMMUNITY): Payer: Self-pay | Admitting: General Practice

## 2019-07-25 DIAGNOSIS — J449 Chronic obstructive pulmonary disease, unspecified: Secondary | ICD-10-CM | POA: Insufficient documentation

## 2019-07-25 DIAGNOSIS — G4730 Sleep apnea, unspecified: Secondary | ICD-10-CM | POA: Diagnosis not present

## 2019-07-25 DIAGNOSIS — E11621 Type 2 diabetes mellitus with foot ulcer: Secondary | ICD-10-CM | POA: Insufficient documentation

## 2019-07-25 DIAGNOSIS — E08621 Diabetes mellitus due to underlying condition with foot ulcer: Secondary | ICD-10-CM | POA: Diagnosis not present

## 2019-07-25 DIAGNOSIS — Z87891 Personal history of nicotine dependence: Secondary | ICD-10-CM | POA: Insufficient documentation

## 2019-07-25 DIAGNOSIS — L97514 Non-pressure chronic ulcer of other part of right foot with necrosis of bone: Secondary | ICD-10-CM | POA: Insufficient documentation

## 2019-07-25 DIAGNOSIS — L97519 Non-pressure chronic ulcer of other part of right foot with unspecified severity: Secondary | ICD-10-CM | POA: Insufficient documentation

## 2019-07-25 DIAGNOSIS — L97512 Non-pressure chronic ulcer of other part of right foot with fat layer exposed: Secondary | ICD-10-CM | POA: Diagnosis not present

## 2019-07-25 DIAGNOSIS — E1151 Type 2 diabetes mellitus with diabetic peripheral angiopathy without gangrene: Secondary | ICD-10-CM | POA: Diagnosis not present

## 2019-07-25 DIAGNOSIS — L97509 Non-pressure chronic ulcer of other part of unspecified foot with unspecified severity: Secondary | ICD-10-CM

## 2019-07-25 HISTORY — PX: WOUND DEBRIDEMENT: SHX247

## 2019-07-25 LAB — GLUCOSE, CAPILLARY
Glucose-Capillary: 157 mg/dL — ABNORMAL HIGH (ref 70–99)
Glucose-Capillary: 124 mg/dL — ABNORMAL HIGH (ref 70–99)

## 2019-07-25 SURGERY — DEBRIDEMENT, WOUND
Anesthesia: Monitor Anesthesia Care | Site: Foot | Laterality: Right

## 2019-07-25 MED ORDER — EPHEDRINE 5 MG/ML INJ
INTRAVENOUS | Status: AC
  Filled 2019-07-25: qty 10

## 2019-07-25 MED ORDER — FENTANYL CITRATE (PF) 100 MCG/2ML IJ SOLN
INTRAMUSCULAR | Status: DC | PRN
  Administered 2019-07-25 (×2): 50 ug via INTRAVENOUS

## 2019-07-25 MED ORDER — METOCLOPRAMIDE HCL 5 MG/ML IJ SOLN: 10.0000 mg | Freq: Once | INTRAMUSCULAR | Status: DC | PRN

## 2019-07-25 MED ORDER — ONDANSETRON HCL 4 MG/2ML IJ SOLN
INTRAMUSCULAR | Status: DC | PRN
  Administered 2019-07-25: 4 mg via INTRAVENOUS

## 2019-07-25 MED ORDER — FENTANYL CITRATE (PF) 100 MCG/2ML IJ SOLN
INTRAMUSCULAR | Status: AC
  Filled 2019-07-25: qty 2

## 2019-07-25 MED ORDER — ONDANSETRON HCL 4 MG/2ML IJ SOLN
INTRAMUSCULAR | Status: AC
  Filled 2019-07-25: qty 2

## 2019-07-25 MED ORDER — SODIUM CHLORIDE 0.9 % IR SOLN
Status: DC | PRN
  Administered 2019-07-25: 1000 mL

## 2019-07-25 MED ORDER — PROPOFOL 10 MG/ML IV BOLUS
INTRAVENOUS | Status: AC
  Filled 2019-07-25: qty 20

## 2019-07-25 MED ORDER — CLINDAMYCIN HCL 300 MG PO CAPS: 300.0000 mg | ORAL_CAPSULE | Freq: Two times a day (BID) | ORAL | 0 refills | Status: DC

## 2019-07-25 MED ORDER — MEPERIDINE HCL 50 MG/ML IJ SOLN: 6.2500 mg | INTRAMUSCULAR | Status: DC | PRN

## 2019-07-25 MED ORDER — LACTATED RINGERS IV SOLN
INTRAVENOUS | Status: DC
  Administered 2019-07-25: 09:00:00 via INTRAVENOUS

## 2019-07-25 MED ORDER — PROPOFOL 500 MG/50ML IV EMUL
INTRAVENOUS | Status: DC | PRN
  Administered 2019-07-25: 125 ug/kg/min via INTRAVENOUS

## 2019-07-25 MED ORDER — BUPIVACAINE HCL (PF) 0.5 % IJ SOLN
INTRAMUSCULAR | Status: AC
  Filled 2019-07-25: qty 30

## 2019-07-25 MED ORDER — 0.9 % SODIUM CHLORIDE (POUR BTL) OPTIME
TOPICAL | Status: DC | PRN
  Administered 2019-07-25: 1000 mL

## 2019-07-25 MED ORDER — OXYCODONE-ACETAMINOPHEN 5-325 MG PO TABS: 1.0000 | ORAL_TABLET | ORAL | 0 refills | Status: DC | PRN

## 2019-07-25 MED ORDER — BUPIVACAINE HCL 0.5 % IJ SOLN
INTRAMUSCULAR | Status: DC | PRN
  Administered 2019-07-25: 10 mL

## 2019-07-25 MED ORDER — LIDOCAINE 2% (20 MG/ML) 5 ML SYRINGE
INTRAMUSCULAR | Status: AC
  Filled 2019-07-25: qty 5

## 2019-07-25 MED ORDER — FENTANYL CITRATE (PF) 100 MCG/2ML IJ SOLN: 25.0000 ug | INTRAMUSCULAR | Status: DC | PRN

## 2019-07-25 MED ORDER — PHENYLEPHRINE 40 MCG/ML (10ML) SYRINGE FOR IV PUSH (FOR BLOOD PRESSURE SUPPORT)
PREFILLED_SYRINGE | INTRAVENOUS | Status: AC
  Filled 2019-07-25: qty 10

## 2019-07-25 MED ORDER — PROPOFOL 10 MG/ML IV BOLUS
INTRAVENOUS | Status: AC
  Filled 2019-07-25: qty 80

## 2019-07-25 SURGICAL SUPPLY — 38 items
BLADE HEX COATED 2.75 (ELECTRODE) ×3 IMPLANT
BLADE SURG 15 STRL LF DISP TIS (BLADE) ×1 IMPLANT
BLADE SURG 15 STRL SS (BLADE) ×2
BNDG ELASTIC 3X5.8 VLCR STR LF (GAUZE/BANDAGES/DRESSINGS) ×3 IMPLANT
BNDG ELASTIC 6X5.8 VLCR STR LF (GAUZE/BANDAGES/DRESSINGS) ×3 IMPLANT
BNDG ESMARK 4X9 LF (GAUZE/BANDAGES/DRESSINGS) ×3 IMPLANT
BNDG GAUZE ELAST 4 BULKY (GAUZE/BANDAGES/DRESSINGS) ×3 IMPLANT
CHLORAPREP W/TINT 26 (MISCELLANEOUS) ×3 IMPLANT
COVER BACK TABLE 60X90IN (DRAPES) ×3 IMPLANT
CUFF TOURN SGL QUICK 18X4 (TOURNIQUET CUFF) ×3 IMPLANT
DRAPE POUCH INSTRU U-SHP 10X18 (DRAPES) ×3 IMPLANT
DRAPE SHEET LG 3/4 BI-LAMINATE (DRAPES) ×6 IMPLANT
DRESSING MATRIX 2X2 BILAYER (Tissue) ×1 IMPLANT
DRSG MATRIX 2X2 BILAYER (Tissue) ×3 IMPLANT
DRSG PAD ABDOMINAL 8X10 ST (GAUZE/BANDAGES/DRESSINGS) IMPLANT
DRSG TELFA 3X8 NADH (GAUZE/BANDAGES/DRESSINGS) ×3 IMPLANT
ELECT PENCIL ROCKER SW 15FT (MISCELLANEOUS) ×3 IMPLANT
GAUZE SPONGE 4X4 12PLY STRL (GAUZE/BANDAGES/DRESSINGS) ×3 IMPLANT
GAUZE XEROFORM 1X8 LF (GAUZE/BANDAGES/DRESSINGS) IMPLANT
GLOVE BIO SURGEON STRL SZ7.5 (GLOVE) ×3 IMPLANT
GLOVE BIOGEL PI IND STRL 8 (GLOVE) ×1 IMPLANT
GLOVE BIOGEL PI INDICATOR 8 (GLOVE) ×2
GOWN STRL REUS W/TWL XL LVL3 (GOWN DISPOSABLE) ×6 IMPLANT
KIT BASIN OR (CUSTOM PROCEDURE TRAY) ×3 IMPLANT
KIT TURNOVER KIT A (KITS) IMPLANT
NEEDLE HYPO 25X1 1.5 SAFETY (NEEDLE) ×3 IMPLANT
SET IRRIG Y TYPE TUR BLADDER L (SET/KITS/TRAYS/PACK) IMPLANT
STAPLER VISISTAT 35W (STAPLE) ×3 IMPLANT
STOCKINETTE 6  STRL (DRAPES) ×2
STOCKINETTE 6 STRL (DRAPES) ×1 IMPLANT
SUT ETHILON 4 0 PS 2 18 (SUTURE) IMPLANT
SUT MNCRL AB 3-0 PS2 18 (SUTURE) IMPLANT
SUT MON AB 5-0 PS2 18 (SUTURE) IMPLANT
SUT VIC AB 3-0 PS2 18 (SUTURE)
SUT VIC AB 3-0 PS2 18XBRD (SUTURE) IMPLANT
SUT VIC AB 4-0 PS2 18 (SUTURE) ×3 IMPLANT
SYR CONTROL 10ML LL (SYRINGE) ×3 IMPLANT
YANKAUER SUCT BULB TIP 10FT TU (MISCELLANEOUS) ×3 IMPLANT

## 2019-07-25 NOTE — Op Note (Signed)
Patient Name: Jason [name] DOB: October 22, 1941  MRN: [id number]   Date of Service: 07/25/2019  Surgeon: Dr. Hardie Pulley, DPM Assistants: Dr. Boneta Lucks, DPM Pre-operative Diagnosis: wound right foot Post-operative Diagnosis: same Procedures:             1) Debridement and irrigation of right foot wound  2) Application of skin graft substitute. Pathology/Specimens: * No specimens in log * Anesthesia: MAC/local Hemostasis: Anatomic Estimated Blood Loss: 5 ml Materials: None Medications: none. Complications: None  Indications for Procedure:  This is a 78 y.o. male with a chronic wound to the right foot. He presents today for debridement to promote wound healing.   Procedure in Detail: Patient was identified in pre-operative holding area. Formal consent was signed and the right lower extremity was marked. Patient was brought back to the operating room and placed on the operating room table in the supine position. Anesthesia was induced.   The extremity was prepped and draped in the usual sterile fashion. Timeout was taken to confirm patient name, laterality, and procedure prior to incision. Attention was then directed to the right foot where a wound measuring 2.5x2.5 was encountered.  The wound was sharply excisionally debrided with a 15 blade, followed by a misonix ultrasonic debrider. Debridement was performed to bleeding, viable wound base. The wound was debrided to the level of the subcutaneous tissue. Following debridement the wound measured 3x3.  An Integra Bilayer graft was applied and adhered with skin staples.  The foot was then dressed with lubricating jelly, Telfa, 4x4, kerlix, and ACE bandage. Patient tolerated the procedure well.   Disposition: Following a period of post-operative monitoring, patient will be transferred back home.

## 2019-07-25 NOTE — Anesthesia Procedure Notes (Signed)
Procedure Name: MAC Date/Time: 07/25/2019 11:55 AM Performed by: Lieutenant Diego, CRNA Pre-anesthesia Checklist: Patient identified, Emergency Drugs available, Suction available, Patient being monitored and Timeout performed Patient Re-evaluated:Patient Re-evaluated prior to induction Oxygen Delivery Method: Simple face mask Preoxygenation: Pre-oxygenation with 100% oxygen Induction Type: IV induction

## 2019-07-25 NOTE — Transfer of Care (Signed)
Immediate Anesthesia Transfer of Care Note  Patient: Jason [name]  Procedure(s) Performed: Right foot wound debridement with application of skin graft substitute (Right Foot)  Patient Location: PACU  Anesthesia Type:MAC  Level of Consciousness: awake  Airway & Oxygen Therapy: Patient Spontanous Breathing and Patient connected to face mask oxygen  Post-op Assessment: Report given to RN and Post -op Vital signs reviewed and stable  Post vital signs: Reviewed and stable  Last Vitals:  Vitals Value Taken Time  BP    Temp    Pulse 61 07/25/19 1219  Resp 19 07/25/19 1219  SpO2 99 % 07/25/19 1219  Vitals shown include unvalidated device data.  Last Pain:  Vitals:   07/25/19 0906  TempSrc: Oral  PainSc: 3          Complications: No apparent anesthesia complications

## 2019-07-25 NOTE — H&P (Signed)
  Subjective:  Patient ID: Jason [name], male    DOB: Jun 19, 1941,  MRN: [id number]  Patient presents today for outpatient surgery. Consent form reviewed. Denies interval changes since last seen in the office Objective:  There were no vitals filed for this visit. General AA&O x3. Normal mood and affect.  Vascular Right foot warm and well perfused.  Neurologic Epicritic sensation grossly intact.  Dermatologic Dressing intact R foot  Orthopedic: Hx amputation all toes right foot.    Assessment & Plan:   Right foot ulceration -To OR today for Right foot debridement, application of skin graft substitute -Continue NPO -RLE marked. -Consent form reviewed and signed.  Evelina Bucy, DPM  Accessible via secure chat for questions or concerns.

## 2019-07-25 NOTE — Anesthesia Postprocedure Evaluation (Signed)
Anesthesia Post Note  Patient: Jason [name]  Procedure(s) Performed: Right foot wound debridement with application of skin graft substitute (Right Foot)     Patient location during evaluation: PACU Anesthesia Type: MAC Level of consciousness: awake and alert Pain management: pain level controlled Vital Signs Assessment: post-procedure vital signs reviewed and stable Respiratory status: spontaneous breathing, nonlabored ventilation and respiratory function stable Cardiovascular status: blood pressure returned to baseline and stable Postop Assessment: no apparent nausea or vomiting Anesthetic complications: no    Last Vitals:  Vitals:   07/25/19 1240 07/25/19 1250  BP: 120/72 (!) 155/89  Pulse: 60 63  Resp: 13 16  Temp: (!) 36.3 C (!) 36.3 C  SpO2: 97% 96%    Last Pain:  Vitals:   07/25/19 1250  TempSrc:   PainSc: 0-No pain                 Lidia Collum

## 2019-07-26 ENCOUNTER — Telehealth: Payer: Self-pay

## 2019-07-26 NOTE — Telephone Encounter (Signed)
POST OP CALL-    1) General condition stated by the patient:   2) Is the pt having pain?   3) Pain score:   4) Has the pt taken Rx'd pain medication, regularly or PRN?   5) Is the pain medication giving relief?  6) Any fever, chills, nausea, or vomiting, shortness of breath or tightness in calf?  7) Is the bandage clean, dry and intact?  8) Is there excessive tightness, bleeding or drainage coming through the bandage?  9) Did you understand all of the post op instruction sheet given?  10) Any questions or concerns regarding post op care/recovery?    Confirmed POV appointment with patient       Pt did not answer. Left message stating if the pt has any questions or concerns to please give [place] a call, otherwise he will be seen at his next scheduled post op appointment.

## 2019-07-30 NOTE — Progress Notes (Signed)
  Subjective:  Patient ID: Jason [name], male    DOB: [birth year],  MRN: [id number]  Chief Complaint  Patient presents with  . Routine Post Op    the right foot is doing ok and did have some draining like puss come out    DOS: 05/17/2019 Procedure: Right 4th/5th Toe amputation with Filleted toe flap, 4th metatarsectomy, TAL.  The patient states that the right foot is doing a lot better, endorses drainage, but is less than last time.  Denies pus, denies odor.  He states that it is not sore, but he does have some tenderness to the right heel.   Objective:  Constitutional Well developed. Well nourished.  Vascular Foot warm and well perfuse.  Neurologic Epicritic sensation absent.  Dermatologic Wound at the right foot fourth and fifth metatarsal areas with fibrogranular wound base.  No warmth, no erythema, no signs of acute infection.  Plantar fourth foot callus resolving.  Orthopedic: Good range of motion about the right ankle joint.   Radiographs:  Assessment/Plan:  Patient was evaluated and treated and all questions answered.  Right foot ulcer status post amputation of the right fourth and fifth toes. - Discussed with the patient that due to delayed healing, patient may benefit from surgical debridement with application of a skin graft substitute in order to promote healing. Will discuss if wound does not meaningfully progress. Continue Silvadene and dry sterile dressing daily.  Continue surgical shoe.  Return in about 2 weeks (around 07/13/2019) for Wound Care, Right.

## 2019-07-31 NOTE — OR Nursing (Signed)
Implant change to reflect the correct product number.  Thank You Josie Saunders

## 2019-08-01 ENCOUNTER — Encounter (HOSPITAL_COMMUNITY): Payer: Self-pay | Admitting: Podiatry

## 2019-08-02 ENCOUNTER — Ambulatory Visit (INDEPENDENT_AMBULATORY_CARE_PROVIDER_SITE_OTHER): Payer: Self-pay | Admitting: Podiatry

## 2019-08-02 ENCOUNTER — Encounter: Payer: Self-pay | Admitting: Podiatry

## 2019-08-02 ENCOUNTER — Other Ambulatory Visit: Payer: Self-pay

## 2019-08-02 VITALS — Temp 98.0°F

## 2019-08-02 DIAGNOSIS — L97514 Non-pressure chronic ulcer of other part of right foot with necrosis of bone: Secondary | ICD-10-CM

## 2019-08-09 ENCOUNTER — Other Ambulatory Visit: Payer: Self-pay

## 2019-08-09 ENCOUNTER — Ambulatory Visit (INDEPENDENT_AMBULATORY_CARE_PROVIDER_SITE_OTHER): Payer: HMO | Admitting: Podiatry

## 2019-08-09 ENCOUNTER — Encounter: Payer: Self-pay | Admitting: Podiatry

## 2019-08-09 DIAGNOSIS — L97514 Non-pressure chronic ulcer of other part of right foot with necrosis of bone: Secondary | ICD-10-CM

## 2019-08-09 NOTE — Patient Outreach (Signed)
  [facility] [facility]) Care Management Chronic Special Needs Program  08/09/2019  Name: Jason [name] DOB: 06-24-1941  MRN: [id number]  [name] is enrolled in a chronic special needs plan for Diabetes. Client called with no answer No answer and HIPAA compliant message left. 1st attempt Plan for 2nd outreach call in one week Chronic care management coordinator will attempt outreach in one week.   Peter Garter RN, Jackquline Denmark, CDE Chronic Care Management Coordinator [facility] [phone]

## 2019-08-09 NOTE — Progress Notes (Signed)
Subjective:  Patient ID: Jason [name], male    DOB: 26-Jan-1941,  MRN: [id number]  Chief Complaint  Patient presents with  . Routine Post Op    " my foot feels fine, I have not had any fever, chills, n/v"     78 y.o. male presents for wound care.  Thinks the wound is doing a little better.   Review of Systems: Negative except as noted in the HPI. Denies N/V/F/Ch.  Past Medical History:  Diagnosis Date  . Anxiety   . Asthma    in past, no inhaler now  . Benign neoplasm of colon   . Bone infection ([place])   . Cancer [facility])    bladder cancer  . COPD (chronic obstructive pulmonary disease) ([place])   . Depression   . Diverticulosis of colon (without mention of hemorrhage)   . DJD (degenerative joint disease)   . Early cataract   . Esophageal reflux   . Glaucoma   . Headache   . Hidradenitis   . History of BPH   . History of gynecomastia    Unilateral  . Hyperlipidemia   . Irritable bowel syndrome   . Memory loss   . Mitral stenosis    mild-moderate MS 01/2016  . MVP (mitral valve prolapse)   . Obesity, unspecified   . Pneumonia   . Sleep apnea    does not use  cpap or bipap .. no study  ever  . Type II or unspecified type diabetes mellitus without mention of complication, not stated as uncontrolled   . Unspecified venous (peripheral) insufficiency   . Wears dentures   . Wears glasses   . Wound drainage    RIGHT FOOT X 1 WEEK CHANGES DRESSING Q DAY WITH RED DRAINAGE    Current Outpatient Medications:  .  aspirin EC 81 MG tablet, Take 1 tablet (81 mg total) by mouth daily., Disp: 90 tablet, Rfl: 3 .  Blood Glucose Monitoring Suppl (ONETOUCH VERIO) w/Device KIT, 1 Act by Does not apply route 3 (three) times daily., Disp: 1 kit, Rfl: 2 .  clindamycin (CLEOCIN) 300 MG capsule, Take 1 capsule (300 mg total) by mouth 2 (two) times daily., Disp: 14 capsule, Rfl: 0 .  collagenase (SANTYL) ointment, Apply 1 application topically daily. Right foot ulcer measurement 3.0 x 2.0 x  0.1cm, Disp: 30 g, Rfl: 5 .  docusate sodium (COLACE) 100 MG capsule, Take 1 capsule (100 mg total) by mouth 2 (two) times daily. (Patient taking differently: Take 100 mg by mouth 2 (two) times daily as needed (constipation.). ), Disp: 10 capsule, Rfl: 0 .  Ertugliflozin L-PyroglutamicAc (STEGLATRO) 5 MG TABS, Take 1 tablet by mouth daily., Disp: 90 tablet, Rfl: 1 .  famotidine (PEPCID) 20 MG tablet, Take 1 tablet (20 mg total) by mouth 2 (two) times daily. (Patient taking differently: Take 20 mg by mouth 2 (two) times daily as needed (heartburn/indigestion.). ), Disp: 10 tablet, Rfl: 0 .  glimepiride (AMARYL) 2 MG tablet, Take 1 tablet (2 mg total) by mouth daily with breakfast. -- Office visit needed for further refills, Disp: 90 tablet, Rfl: 0 .  glucose blood (TRUE METRIX BLOOD GLUCOSE TEST) test strip, Use to test blood sugar twice daily. DX: E11.9, Disp: 200 each, Rfl: 3 .  metFORMIN (GLUCOPHAGE) 500 MG tablet, Take 2 tablets (1,000 mg total) by mouth 2 (two) times daily. -- Office visit needed for further refills, Disp: 360 tablet, Rfl: 0 .  Multiple Vitamins-Minerals (MULTIVITAMIN WITH MINERALS) tablet,  Take 1 tablet by mouth daily., Disp: , Rfl:  .  oxyCODONE-acetaminophen (PERCOCET) 5-325 MG tablet, Take 1 tablet by mouth every 4 (four) hours as needed for severe pain., Disp: 20 tablet, Rfl: 0 .  simvastatin (ZOCOR) 40 MG tablet, Take 1 tablet (40 mg total) by mouth every evening. -- Office visit needed for further refills, Disp: 90 tablet, Rfl: 0 .  Skin Protectants, Misc. (EUCERIN) cream, Apply 1 application topically as needed for dry skin (ear itching.)., Disp: , Rfl:  .  TRUEplus Lancets 33G MISC, Use to test blood sugar twice daily. DX: E11.9, Disp: 200 each, Rfl: 3 .  vitamin C (ASCORBIC ACID) 500 MG tablet, Take 1,000 mg by mouth daily., Disp: , Rfl:  .  Wound Dressings (MEDIHONEY WOUND/BURN DRESSING) GEL, Apply to affected are 3 times a week, and cover with sterile dressing., Disp: 15  mL, Rfl: 2  Social History   Tobacco Use  Smoking Status Former Smoker  . Packs/day: 0.10  . Years: 5.00  . Pack years: 0.50  . Types: Cigarettes  . Quit date: 05/10/2015  . Years since quitting: 4.2  Smokeless Tobacco Never Used    Allergies  Allergen Reactions  . Bactrim [Sulfamethoxazole-Trimethoprim] Rash   Objective:   Vitals:   There is no height or weight on file to calculate BMI. Constitutional Well developed. Well nourished.  Vascular Dorsalis pedis pulses palpable bilaterally. Posterior tibial pulses palpable bilaterally. Capillary refill normal to all digits.  No cyanosis or clubbing noted. Pedal hair growth normal.  Neurologic Normal speech. Oriented to person, place, and time. Protective sensation absent  Dermatologic Wound Location: Right 4th/5th toe area Wound Base: Granular/Healthy Peri-wound: Normal Exudate: None: wound tissue dry Wound Measurements: -1.5x2  Orthopedic: No pain to palpation either foot.   Radiographs: None Assessment:   1. Ulcer of toe of right foot, with necrosis of bone ([place])    Plan:  Patient was evaluated and treated and all questions answered.  Ulcer Right foot -Graft removed -Dressed with medihoney, DSD. -Continue off-loading with surgical shoe.    No follow-ups on file.

## 2019-08-12 NOTE — Progress Notes (Signed)
  Subjective:  Patient ID: Jason [name], male    DOB: [birth year],  MRN: [id number]  Chief Complaint  Patient presents with  . Wound Check    Right foot wound care. Pt states no concerns. Denies fever/nausea/vomiting/chills.    DOS: 05/17/2019 Procedure: Right 4th/5th Toe amputation with Filleted toe flap, 4th metatarsectomy, TAL.  History above confirmed with patient.  Still applying Iodosorb to the wound  Objective:  Constitutional Well developed. Well nourished.  Vascular Foot warm and well perfuse.  Neurologic Epicritic sensation absent.  Dermatologic Wound at the right foot fourth and fifth metatarsal areas with fibrogranular wound base.  No warmth, no erythema, no signs of acute infection.  Plantar fourth foot callus resolving.  Orthopedic: Good range of motion about the right ankle joint.   Radiographs:  Assessment/Plan:  Patient was evaluated and treated and all questions answered.  Right foot ulcer status post amputation of the right fourth and fifth toes. -Given failure of the wound to meaningfully progress we will plan for debridement with application skin graft substitute to promote healing.  All risk benefits alternatives of surgery discussed the patient no guarantees were given.  No follow-ups on file.

## 2019-08-15 ENCOUNTER — Other Ambulatory Visit: Payer: Self-pay

## 2019-08-15 ENCOUNTER — Ambulatory Visit (HOSPITAL_COMMUNITY): Payer: HMO | Attending: Cardiovascular Disease

## 2019-08-15 DIAGNOSIS — I050 Rheumatic mitral stenosis: Secondary | ICD-10-CM | POA: Insufficient documentation

## 2019-08-16 NOTE — Progress Notes (Signed)
Echo reviewed with Dr. Percival Spanish, normal pumping function, the aortic stenosis worsened slightly, we will continue to monitor on followup. Per Dr. Percival Spanish, followup in 4 month

## 2019-08-17 ENCOUNTER — Other Ambulatory Visit: Payer: Self-pay

## 2019-08-17 NOTE — Patient Outreach (Signed)
  [place] [facility]) Care Management Chronic Special Needs Program  08/17/2019  Name: LATHAN QUIROA DOB: [birth year]  MRN: [id number]  Mr. Owynn Thwaites is enrolled in a chronic special needs plan for Diabetes. Client called with no answer No answer and HIPAA compliant message left. 2nd attempt Plan for 3rd outreach call in one week Chronic care management coordinator will attempt outreach in one week.   Peter Garter RN, Jackquline Denmark, CDE Chronic Care Management Coordinator [facility] [phone]

## 2019-08-21 ENCOUNTER — Other Ambulatory Visit: Payer: Self-pay

## 2019-08-21 NOTE — Patient Outreach (Signed)
  [facility]) Care Management Chronic Special Needs Program  08/21/2019  Name: JESS PILLARD DOB: 1941/07/14  MRN: [id number]  Mr. Purnell Hijazi is enrolled in a chronic special needs plan for Diabetes. 3rd outreach call. Client called requested RNCM call back after 4 PM today Client called after 4 PM.  No answer and HIPAA compliant message left. Plan to update Interdisciplinary care plan if no outreach from client in 1-2 days Chronic care management coordinator will attempt outreach in 3 Months.   Peter Garter RN, Jackquline Denmark, CDE Chronic Care Management Coordinator [facility] [phone]

## 2019-08-22 ENCOUNTER — Other Ambulatory Visit: Payer: Self-pay

## 2019-08-22 NOTE — Patient Outreach (Signed)
[facility]) Care Management Chronic Special Needs Program  08/22/2019  Name: Jason [name] DOB: Feb 28, 1941  MRN: [id number]  Mr. Conlee Jonassen is enrolled in a chronic special needs plan for Diabetes. Reviewed and updated care plan.  Subjective: States that his foot is healing since he had the skin graft.  States he is to see the foot doctor tomorrow to have it checked.  States his wife has been doing his dressing changes.  States he has been trying to watch his diet better.  States that his blood sugars range from 120-151 in the morning and are usually around 130.  States he and his wife are getting the meal delivery and that has been helpful.  Denies any issues with getting to his appts at this time.    Goals Addressed            This Visit's Progress   . Advanced Care Planning complete by next 9 months(continue 08/22/19)   On track   . Client understands the importance of follow-up with providers by attending scheduled visits   On track   . COMPLETED: Client will have ADL/IADL needs addressed by next 6 months       Social work completed referral    . COMPLETED: Client will report improved coping with diabetes by next 3 months       Coping better with diabetes    . Client will report no worsening of symptoms related to heart disease within the next 9 months(continue 08/22/19)   On track   . Client will use Assistive Devices as needed and verbalize understanding of device use   On track   . Client will verbalize understanding of treatment plan for impaired skin integrity and follow up with provider by next 3 months   On track   . Client/Caregiver will verbalize understanding of instructions related to self-care and safety   On track   . Decrease inpatient admissions/ readmissions with in the next year   On track   . Decrease inpatient diabetes admissions/readmissions with in the year   On track   . HEMOGLOBIN A1C < 7.0       Diabetes self management actions:   Glucose monitoring per provider recommendations  Eat Healthy  Check feet daily  Visit provider every 3-6 months as directed  Hbg A1C level every 3-6 months.  Eye Exam yearly    . Maintain timely refills of diabetic medication as prescribed within the year .   On track   . COMPLETED: Obtain Hemoglobin A1C at least 2 times per year       Completed 04/18/19, 07/19/19    . COMPLETED: Visit Primary Care Provider or Endocrinologist at least 2 times per year        Completed 04/18/19, 07/19/19     Client is meeting diabetes self management goal of hemoglobin A1C of <7% with last reading of 6.9% Client has improved his Hemoglobin A1C from 9% to 6.9% Recovering from rt toe wound debridement with skin graft and he is followed by podiatrist closely Reinforced to follow a low CHO diet and to try to avoid concentrated sweets Reviewed importance of keeping his Hemoglobin A1C below 7% and how it effects all of his body Praised for getting his Hemoglobin A1C below 7% Reinforced fall and safety precautions  Reviewed number for 24-hour nurse Line Reviewed COVID 19 precautions Plan:  Send successful outreach letter with a copy of their individualized care plan and Send individual care plan  to provider  Chronic care management coordinator will outreach in:  3 Months     [place], [facility], [place] Management Coordinator [place] [facility] [phone]

## 2019-08-23 ENCOUNTER — Ambulatory Visit (INDEPENDENT_AMBULATORY_CARE_PROVIDER_SITE_OTHER): Payer: HMO | Admitting: Podiatry

## 2019-08-23 ENCOUNTER — Encounter: Payer: Self-pay | Admitting: Podiatry

## 2019-08-23 ENCOUNTER — Other Ambulatory Visit: Payer: Self-pay

## 2019-08-23 DIAGNOSIS — L97514 Non-pressure chronic ulcer of other part of right foot with necrosis of bone: Secondary | ICD-10-CM

## 2019-08-29 NOTE — Progress Notes (Signed)
Subjective:  Patient ID: Jason [name], male    DOB: 1941-02-21,  MRN: [id number]  Chief Complaint  Patient presents with  . Routine Post Op    " my foot is feeling ok, I have felt good, I hope that it is healing well"     DOS: 9/9//2020 Procedure: Debridement right foot wound, application of skin graft substitute.  78 y.o. male returns for post-op check. Hx as above.  Review of Systems: Negative except as noted in the HPI. Denies N/V/F/Ch.  Past Medical History:  Diagnosis Date  . Anxiety   . Asthma    in past, no inhaler now  . Benign neoplasm of colon   . Bone infection ([place])   . Cancer [facility])    bladder cancer  . COPD (chronic obstructive pulmonary disease) ([place])   . Depression   . Diverticulosis of colon (without mention of hemorrhage)   . DJD (degenerative joint disease)   . Early cataract   . Esophageal reflux   . Glaucoma   . Headache   . Hidradenitis   . History of BPH   . History of gynecomastia    Unilateral  . Hyperlipidemia   . Irritable bowel syndrome   . Memory loss   . Mitral stenosis    mild-moderate MS 01/2016  . MVP (mitral valve prolapse)   . Obesity, unspecified   . Pneumonia   . Sleep apnea    does not use  cpap or bipap .. no study  ever  . Type II or unspecified type diabetes mellitus without mention of complication, not stated as uncontrolled   . Unspecified venous (peripheral) insufficiency   . Wears dentures   . Wears glasses   . Wound drainage    RIGHT FOOT X 1 WEEK CHANGES DRESSING Q DAY WITH RED DRAINAGE    Current Outpatient Medications:  .  aspirin EC 81 MG tablet, Take 1 tablet (81 mg total) by mouth daily., Disp: 90 tablet, Rfl: 3 .  Blood Glucose Monitoring Suppl (ONETOUCH VERIO) w/Device KIT, 1 Act by Does not apply route 3 (three) times daily., Disp: 1 kit, Rfl: 2 .  clindamycin (CLEOCIN) 300 MG capsule, Take 1 capsule (300 mg total) by mouth 2 (two) times daily., Disp: 14 capsule, Rfl: 0 .  collagenase (SANTYL) ointment,  Apply 1 application topically daily. Right foot ulcer measurement 3.0 x 2.0 x 0.1cm, Disp: 30 g, Rfl: 5 .  docusate sodium (COLACE) 100 MG capsule, Take 1 capsule (100 mg total) by mouth 2 (two) times daily. (Patient taking differently: Take 100 mg by mouth 2 (two) times daily as needed (constipation.). ), Disp: 10 capsule, Rfl: 0 .  Ertugliflozin L-PyroglutamicAc (STEGLATRO) 5 MG TABS, Take 1 tablet by mouth daily., Disp: 90 tablet, Rfl: 1 .  famotidine (PEPCID) 20 MG tablet, Take 1 tablet (20 mg total) by mouth 2 (two) times daily. (Patient taking differently: Take 20 mg by mouth 2 (two) times daily as needed (heartburn/indigestion.). ), Disp: 10 tablet, Rfl: 0 .  glimepiride (AMARYL) 2 MG tablet, Take 1 tablet (2 mg total) by mouth daily with breakfast. -- Office visit needed for further refills, Disp: 90 tablet, Rfl: 0 .  glucose blood (TRUE METRIX BLOOD GLUCOSE TEST) test strip, Use to test blood sugar twice daily. DX: E11.9, Disp: 200 each, Rfl: 3 .  metFORMIN (GLUCOPHAGE) 500 MG tablet, Take 2 tablets (1,000 mg total) by mouth 2 (two) times daily. -- Office visit needed for further refills, Disp: 360 tablet,  Rfl: 0 .  Multiple Vitamins-Minerals (MULTIVITAMIN WITH MINERALS) tablet, Take 1 tablet by mouth daily., Disp: , Rfl:  .  oxyCODONE-acetaminophen (PERCOCET) 5-325 MG tablet, Take 1 tablet by mouth every 4 (four) hours as needed for severe pain., Disp: 20 tablet, Rfl: 0 .  simvastatin (ZOCOR) 40 MG tablet, Take 1 tablet (40 mg total) by mouth every evening. -- Office visit needed for further refills, Disp: 90 tablet, Rfl: 0 .  Skin Protectants, Misc. (EUCERIN) cream, Apply 1 application topically as needed for dry skin (ear itching.)., Disp: , Rfl:  .  TRUEplus Lancets 33G MISC, Use to test blood sugar twice daily. DX: E11.9, Disp: 200 each, Rfl: 3 .  vitamin C (ASCORBIC ACID) 500 MG tablet, Take 1,000 mg by mouth daily., Disp: , Rfl:  .  Wound Dressings (MEDIHONEY WOUND/BURN DRESSING) GEL,  Apply to affected are 3 times a week, and cover with sterile dressing., Disp: 15 mL, Rfl: 2  Social History   Tobacco Use  Smoking Status Former Smoker  . Packs/day: 0.10  . Years: 5.00  . Pack years: 0.50  . Types: Cigarettes  . Quit date: 05/10/2015  . Years since quitting: 4.3  Smokeless Tobacco Never Used    Allergies  Allergen Reactions  . Bactrim [Sulfamethoxazole-Trimethoprim] Rash   Objective:   Vitals:   08/02/19 1056  Temp: 98 F (36.7 C)   There is no height or weight on file to calculate BMI. Constitutional Well developed. Well nourished.  Vascular Foot warm and well perfused. Capillary refill normal to all digits.   Neurologic Normal speech. Oriented to person, place, and time. Epicritic sensation to light touch grossly present bilaterally.  Dermatologic Wound graft intact right foot no warmth erythema signs of infection.  Orthopedic: Tenderness to palpation noted about the surgical site.   Radiographs: None Assessment:   1. Ulcer of toe of right foot, with necrosis of bone ([place])    Plan:  Patient was evaluated and treated and all questions answered.  S/p foot surgery right -Progressing as expected post-operatively. -XR: none -WB Status: WBAT in shoes -Sutures: n/a. -Medications: none -Foot redressed. Wound graft rehydrated with hydrogel  No follow-ups on file.

## 2019-09-04 ENCOUNTER — Other Ambulatory Visit: Payer: Self-pay | Admitting: Internal Medicine

## 2019-09-04 DIAGNOSIS — E1151 Type 2 diabetes mellitus with diabetic peripheral angiopathy without gangrene: Secondary | ICD-10-CM

## 2019-09-04 DIAGNOSIS — IMO0002 Reserved for concepts with insufficient information to code with codable children: Secondary | ICD-10-CM

## 2019-09-04 DIAGNOSIS — E118 Type 2 diabetes mellitus with unspecified complications: Secondary | ICD-10-CM

## 2019-09-04 DIAGNOSIS — I70209 Unspecified atherosclerosis of native arteries of extremities, unspecified extremity: Secondary | ICD-10-CM

## 2019-09-04 DIAGNOSIS — E1165 Type 2 diabetes mellitus with hyperglycemia: Secondary | ICD-10-CM

## 2019-09-13 ENCOUNTER — Encounter: Payer: Self-pay | Admitting: Internal Medicine

## 2019-09-13 ENCOUNTER — Ambulatory Visit (INDEPENDENT_AMBULATORY_CARE_PROVIDER_SITE_OTHER): Payer: HMO | Admitting: Internal Medicine

## 2019-09-13 ENCOUNTER — Other Ambulatory Visit: Payer: Self-pay

## 2019-09-13 VITALS — BP 142/48 | HR 79 | Temp 98.5°F | Resp 16 | Ht >= 80 in | Wt 299.0 lb

## 2019-09-13 DIAGNOSIS — E1151 Type 2 diabetes mellitus with diabetic peripheral angiopathy without gangrene: Secondary | ICD-10-CM

## 2019-09-13 DIAGNOSIS — Z23 Encounter for immunization: Secondary | ICD-10-CM | POA: Diagnosis not present

## 2019-09-13 LAB — POCT GLYCOSYLATED HEMOGLOBIN (HGB A1C): Hemoglobin A1C: 7 % — AB (ref 4.0–5.6)

## 2019-09-13 MED ORDER — ZOSTER VAC RECOMB ADJUVANTED 50 MCG/0.5ML IM SUSR: 0.5000 mL | Freq: Once | INTRAMUSCULAR | 1 refills | Status: AC

## 2019-09-13 NOTE — Progress Notes (Signed)
Subjective:  Patient ID: Jason [name], male    DOB: 1941-09-09  Age: 78 y.o. MRN: [id number]  CC: Diabetes   HPI Jason [name] presents for f/up - He tells me his blood sugars have been well controlled.  He recently had a blood sugar down to 100 and he is concerned that that is too low.  He wants to know if he can stop taking one of his diabetes medications.  He tells me he is currently taking Metformin and a sulfonylurea but he is not taking the prescribed SGLT2 inhibitor.  Outpatient Medications Prior to Visit  Medication Sig Dispense Refill  . aspirin EC 81 MG tablet Take 1 tablet (81 mg total) by mouth daily. 90 tablet 3  . Blood Glucose Monitoring Suppl (ONETOUCH VERIO) w/Device KIT 1 Act by Does not apply route 3 (three) times daily. 1 kit 2  . collagenase (SANTYL) ointment Apply 1 application topically daily. Right foot ulcer measurement 3.0 x 2.0 x 0.1cm 30 g 5  . docusate sodium (COLACE) 100 MG capsule Take 1 capsule (100 mg total) by mouth 2 (two) times daily. (Patient taking differently: Take 100 mg by mouth 2 (two) times daily as needed (constipation.). ) 10 capsule 0  . famotidine (PEPCID) 20 MG tablet Take 1 tablet (20 mg total) by mouth 2 (two) times daily. (Patient taking differently: Take 20 mg by mouth 2 (two) times daily as needed (heartburn/indigestion.). ) 10 tablet 0  . glucose blood (TRUE METRIX BLOOD GLUCOSE TEST) test strip Use to test blood sugar twice daily. DX: E11.9 200 each 3  . metFORMIN (GLUCOPHAGE) 500 MG tablet Take 2 tablets (1,000 mg total) by mouth 2 (two) times daily with a meal. 180 tablet 1  . Multiple Vitamins-Minerals (MULTIVITAMIN WITH MINERALS) tablet Take 1 tablet by mouth daily.    . simvastatin (ZOCOR) 40 MG tablet Take 1 tablet (40 mg total) by mouth daily at 6 PM. 90 tablet 1  . Skin Protectants, Misc. (EUCERIN) cream Apply 1 application topically as needed for dry skin (ear itching.).    Marland Kitchen TRUEplus Lancets 33G MISC Use to test blood sugar  twice daily. DX: E11.9 200 each 3  . vitamin C (ASCORBIC ACID) 500 MG tablet Take 1,000 mg by mouth daily.    . Wound Dressings (MEDIHONEY WOUND/BURN DRESSING) GEL Apply to affected are 3 times a week, and cover with sterile dressing. 15 mL 2  . Ertugliflozin L-PyroglutamicAc (STEGLATRO) 5 MG TABS Take 1 tablet by mouth daily. 90 tablet 1  . glimepiride (AMARYL) 2 MG tablet Take 1 tablet (2 mg total) by mouth daily with breakfast. 90 tablet 1  . oxyCODONE-acetaminophen (PERCOCET) 5-325 MG tablet Take 1 tablet by mouth every 4 (four) hours as needed for severe pain. 20 tablet 0  . clindamycin (CLEOCIN) 300 MG capsule Take 1 capsule (300 mg total) by mouth 2 (two) times daily. (Patient not taking: Reported on 09/13/2019) 14 capsule 0   No facility-administered medications prior to visit.     ROS Review of Systems  Constitutional: Negative for diaphoresis, fatigue and unexpected weight change.  HENT: Negative.   Respiratory: Negative for cough, shortness of breath and wheezing.   Cardiovascular: Negative for chest pain, palpitations and leg swelling.  Gastrointestinal: Negative for abdominal pain, constipation, diarrhea, nausea and vomiting.  Endocrine: Negative.   Genitourinary: Negative.  Negative for difficulty urinating.  Musculoskeletal: Negative for arthralgias and myalgias.  Skin: Negative.  Negative for color change.  Neurological: Negative.  Negative for dizziness, weakness and light-headedness.  Hematological: Negative for adenopathy. Does not bruise/bleed easily.  Psychiatric/Behavioral: Negative.     Objective:  BP (!) 142/48 (BP Location: Left Arm, Patient Position: Sitting, Cuff Size: Large)   Pulse 79   Temp 98.5 F (36.9 C) (Oral)   Resp 16   Ht '6\' 8"'  (2.032 m)   Wt 299 lb (135.6 kg)   SpO2 97%   BMI 32.85 kg/m   BP Readings from Last 3 Encounters:  09/13/19 (!) 142/48  07/25/19 (!) 155/89  07/24/19 (!) 139/59    Wt Readings from Last 3 Encounters:  09/13/19  299 lb (135.6 kg)  07/25/19 298 lb (135.2 kg)  07/24/19 298 lb (135.2 kg)    Physical Exam Vitals signs reviewed.  HENT:     Mouth/Throat:     Mouth: Mucous membranes are moist.  Eyes:     Conjunctiva/sclera: Conjunctivae normal.  Neck:     Musculoskeletal: Normal range of motion.  Cardiovascular:     Rate and Rhythm: Normal rate and regular rhythm.     Heart sounds: No murmur.  Pulmonary:     Effort: Pulmonary effort is normal.     Breath sounds: No stridor. No wheezing, rhonchi or rales.  Abdominal:     General: Abdomen is protuberant. Bowel sounds are normal. There is no distension.     Palpations: There is no hepatomegaly or splenomegaly.     Tenderness: There is no abdominal tenderness.  Musculoskeletal: Normal range of motion.     Right lower leg: No edema.     Left lower leg: No edema.  Lymphadenopathy:     Cervical: No cervical adenopathy.  Skin:    General: Skin is warm and dry.  Neurological:     General: No focal deficit present.     Mental Status: He is alert.  Psychiatric:        Mood and Affect: Mood normal.        Behavior: Behavior normal.     Lab Results  Component Value Date   WBC 5.8 07/19/2019   HGB 13.0 07/19/2019   HCT 39.4 07/19/2019   PLT 167.0 07/19/2019   GLUCOSE 260 (H) 07/19/2019   CHOL 137 04/18/2019   TRIG 80.0 04/18/2019   HDL 52.40 04/18/2019   LDLCALC 68 04/18/2019   ALT 18 05/17/2019   AST 19 05/17/2019   NA 138 07/19/2019   K 4.1 07/19/2019   CL 102 07/19/2019   CREATININE 1.07 07/19/2019   BUN 17 07/19/2019   CO2 27 07/19/2019   TSH 0.85 04/18/2019   PSA 1.13 04/18/2019   INR 1.05 10/17/2012   HGBA1C 7.0 (A) 09/13/2019   MICROALBUR 11.3 (H) 04/18/2019    No results found.  Assessment & Plan:   Jason [name] was seen today for diabetes.  Diagnoses and all orders for this visit:  Need for Tdap vaccination -     Tdap vaccine greater than or equal to 7yo IM  Need for pneumococcal vaccination -     Pneumococcal  polysaccharide vaccine 23-valent greater than or equal to 2yo subcutaneous/IM  Type II diabetes mellitus with peripheral circulatory disorder ([place])- His A1c is at 7.0%.  His blood sugars are adequately well controlled.  Since he wants to consolidate his diabetic meds and is concerned about hypoglycemia I have asked him to stop taking the sulfonylurea and to stay on Metformin as monotherapy. -     POCT glycosylated hemoglobin (Hb A1C)   I have discontinued  Doreene Burke Ertugliflozin L-PyroglutamicAc, oxyCODONE-acetaminophen, clindamycin, and glimepiride. I am also having him maintain his aspirin EC, docusate sodium, famotidine, TRUEplus Lancets 33G, glucose blood, OneTouch Verio, eucerin, vitamin C, Santyl, Medihoney Wound/Burn Dressing, multivitamin with minerals, metFORMIN, and simvastatin.  No orders of the defined types were placed in this encounter.    Follow-up: Return in about 6 months (around 03/13/2020).  Scarlette Calico, MD

## 2019-09-13 NOTE — Addendum Note (Signed)
Addended by: Aviva Signs M on: 09/13/2019 01:08 PM   Modules accepted: Orders

## 2019-09-13 NOTE — Patient Instructions (Signed)
Type 2 Diabetes Mellitus, Diagnosis, Adult Type 2 diabetes (type 2 diabetes mellitus) is a long-term (chronic) disease. In type 2 diabetes, one or both of these problems may be present:  The pancreas does not make enough of a hormone called insulin.  Cells in the body do not respond properly to insulin that the body makes (insulin resistance). Normally, insulin allows blood sugar (glucose) to enter cells in the body. The cells use glucose for energy. Insulin resistance or lack of insulin causes excess glucose to build up in the blood instead of going into cells. As a result, high blood glucose (hyperglycemia) develops. What increases the risk? The following factors may make you more likely to develop type 2 diabetes:  Having a family member with type 2 diabetes.  Being overweight or obese.  Having an inactive (sedentary) lifestyle.  Having been diagnosed with insulin resistance.  Having a history of prediabetes, gestational diabetes, or polycystic ovary syndrome (PCOS).  Being of American-Indian, African-American, Hispanic/Latino, or Asian/Pacific Islander descent. What are the signs or symptoms? In the early stage of this condition, you may not have symptoms. Symptoms develop slowly and may include:  Increased thirst (polydipsia).  Increased hunger(polyphagia).  Increased urination (polyuria).  Increased urination during the night (nocturia).  Unexplained weight loss.  Frequent infections that keep coming back (recurring).  Fatigue.  Weakness.  Vision changes, such as blurry vision.  Cuts or bruises that are slow to heal.  Tingling or numbness in the hands or feet.  Dark patches on the skin (acanthosis nigricans). How is this diagnosed? This condition is diagnosed based on your symptoms, your medical history, a physical exam, and your blood glucose level. Your blood glucose may be checked with one or more of the following blood tests:  A fasting blood glucose (FBG)  test. You will not be allowed to eat (you will fast) for 8 hours or longer before a blood sample is taken.  A random blood glucose test. This test checks blood glucose at any time of day regardless of when you ate.  An A1c (hemoglobin A1c) blood test. This test provides information about blood glucose control over the previous 2-3 months.  An oral glucose tolerance test (OGTT). This test measures your blood glucose at two times: ? After fasting. This is your baseline blood glucose level. ? Two hours after drinking a beverage that contains glucose. You may be diagnosed with type 2 diabetes if:  Your FBG level is 126 mg/dL (7.0 mmol/L) or higher.  Your random blood glucose level is 200 mg/dL (11.1 mmol/L) or higher.  Your A1c level is 6.5% or higher.  Your OGTT result is higher than 200 mg/dL (11.1 mmol/L). These blood tests may be repeated to confirm your diagnosis. How is this treated? Your treatment may be managed by a specialist called an endocrinologist. Type 2 diabetes may be treated by following instructions from your health care provider about:  Making diet and lifestyle changes. This may include: ? Following an individualized nutrition plan that is developed by a diet and nutrition specialist (registered dietitian). ? Exercising regularly. ? Finding ways to manage stress.  Checking your blood glucose level as often as told.  Taking diabetes medicines or insulin daily. This helps to keep your blood glucose levels in the healthy range. ? If you use insulin, you may need to adjust the dosage depending on how physically active you are and what foods you eat. Your health care provider will tell you how to adjust your dosage.    Taking medicines to help prevent complications from diabetes, such as: ? Aspirin. ? Medicine to lower cholesterol. ? Medicine to control blood pressure. Your health care provider will set individualized treatment goals for you. Your goals will be based on  your age, other medical conditions you have, and how you respond to diabetes treatment. Generally, the goal of treatment is to maintain the following blood glucose levels:  Before meals (preprandial): 80-130 mg/dL (4.4-7.2 mmol/L).  After meals (postprandial): below 180 mg/dL (10 mmol/L).  A1c level: less than 7%. Follow these instructions at home: Questions to ask your health care provider  Consider asking the following questions: ? Do I need to meet with a diabetes educator? ? Where can I find a support group for people with diabetes? ? What equipment will I need to manage my diabetes at home? ? What diabetes medicines do I need, and when should I take them? ? How often do I need to check my blood glucose? ? What number can I call if I have questions? ? When is my next appointment? General instructions  Take over-the-counter and prescription medicines only as told by your health care provider.  Keep all follow-up visits as told by your health care provider. This is important.  For more information about diabetes, visit: ? American Diabetes Association (ADA): www.diabetes.org ? American Association of Diabetes Educators (AADE): www.diabeteseducator.org Contact a health care provider if:  Your blood glucose is at or above 240 mg/dL (13.3 mmol/L) for 2 days in a row.  You have been sick or have had a fever for 2 days or longer, and you are not getting better.  You have any of the following problems for more than 6 hours: ? You cannot eat or drink. ? You have nausea and vomiting. ? You have diarrhea. Get help right away if:  Your blood glucose is lower than 54 mg/dL (3.0 mmol/L).  You become confused or you have trouble thinking clearly.  You have difficulty breathing.  You have moderate or large ketone levels in your urine. Summary  Type 2 diabetes (type 2 diabetes mellitus) is a long-term (chronic) disease. In type 2 diabetes, the pancreas does not make enough of a  hormone called insulin, or cells in the body do not respond properly to insulin that the body makes (insulin resistance).  This condition is treated by making diet and lifestyle changes and taking diabetes medicines or insulin.  Your health care provider will set individualized treatment goals for you. Your goals will be based on your age, other medical conditions you have, and how you respond to diabetes treatment.  Keep all follow-up visits as told by your health care provider. This is important. This information is not intended to replace advice given to you by your health care provider. Make sure you discuss any questions you have with your health care provider. Document Released: 11/01/2005 Document Revised: 12/30/2017 Document Reviewed: 12/05/2015 Elsevier Patient Education  2020 [employer]  

## 2019-09-14 ENCOUNTER — Ambulatory Visit (INDEPENDENT_AMBULATORY_CARE_PROVIDER_SITE_OTHER): Payer: HMO | Admitting: Podiatry

## 2019-09-14 ENCOUNTER — Other Ambulatory Visit: Payer: Self-pay

## 2019-09-14 DIAGNOSIS — L928 Other granulomatous disorders of the skin and subcutaneous tissue: Secondary | ICD-10-CM

## 2019-09-14 DIAGNOSIS — L97514 Non-pressure chronic ulcer of other part of right foot with necrosis of bone: Secondary | ICD-10-CM | POA: Diagnosis not present

## 2019-09-14 NOTE — Progress Notes (Signed)
Subjective:  Patient ID: Jason [name], male    DOB: 05-Sep-1941,  MRN: [id number]  Chief Complaint  Patient presents with  . Callouses    Right plantar callous is painful.  . Wound Check    DOS 07/25/2019 WOUND DEBRIDEMENT AND SKIN GRAFT APPLICATION RT. Pt states improvement, denies fever/nausea/vomiting/chills/drainage.    78 y.o. male presents for wound care.  Thinks the wound is doing a lot better  Review of Systems: Negative except as noted in the HPI. Denies N/V/F/Ch.  Past Medical History:  Diagnosis Date  . Anxiety   . Asthma    in past, no inhaler now  . Benign neoplasm of colon   . Bone infection ([place])   . Cancer [facility])    bladder cancer  . COPD (chronic obstructive pulmonary disease) ([place])   . Depression   . Diverticulosis of colon (without mention of hemorrhage)   . DJD (degenerative joint disease)   . Early cataract   . Esophageal reflux   . Glaucoma   . Headache   . Hidradenitis   . History of BPH   . History of gynecomastia    Unilateral  . Hyperlipidemia   . Irritable bowel syndrome   . Memory loss   . Mitral stenosis    mild-moderate MS 01/2016  . MVP (mitral valve prolapse)   . Obesity, unspecified   . Pneumonia   . Sleep apnea    does not use  cpap or bipap .. no study  ever  . Type II or unspecified type diabetes mellitus without mention of complication, not stated as uncontrolled   . Unspecified venous (peripheral) insufficiency   . Wears dentures   . Wears glasses   . Wound drainage    RIGHT FOOT X 1 WEEK CHANGES DRESSING Q DAY WITH RED DRAINAGE    Current Outpatient Medications:  .  aspirin EC 81 MG tablet, Take 1 tablet (81 mg total) by mouth daily., Disp: 90 tablet, Rfl: 3 .  Blood Glucose Monitoring Suppl (ONETOUCH VERIO) w/Device KIT, 1 Act by Does not apply route 3 (three) times daily., Disp: 1 kit, Rfl: 2 .  collagenase (SANTYL) ointment, Apply 1 application topically daily. Right foot ulcer measurement 3.0 x 2.0 x 0.1cm, Disp: 30 g,  Rfl: 5 .  docusate sodium (COLACE) 100 MG capsule, Take 1 capsule (100 mg total) by mouth 2 (two) times daily. (Patient taking differently: Take 100 mg by mouth 2 (two) times daily as needed (constipation.). ), Disp: 10 capsule, Rfl: 0 .  famotidine (PEPCID) 20 MG tablet, Take 1 tablet (20 mg total) by mouth 2 (two) times daily. (Patient taking differently: Take 20 mg by mouth 2 (two) times daily as needed (heartburn/indigestion.). ), Disp: 10 tablet, Rfl: 0 .  glucose blood (TRUE METRIX BLOOD GLUCOSE TEST) test strip, Use to test blood sugar twice daily. DX: E11.9, Disp: 200 each, Rfl: 3 .  metFORMIN (GLUCOPHAGE) 500 MG tablet, Take 2 tablets (1,000 mg total) by mouth 2 (two) times daily with a meal., Disp: 180 tablet, Rfl: 1 .  Multiple Vitamins-Minerals (MULTIVITAMIN WITH MINERALS) tablet, Take 1 tablet by mouth daily., Disp: , Rfl:  .  simvastatin (ZOCOR) 40 MG tablet, Take 1 tablet (40 mg total) by mouth daily at 6 PM., Disp: 90 tablet, Rfl: 1 .  Skin Protectants, Misc. (EUCERIN) cream, Apply 1 application topically as needed for dry skin (ear itching.)., Disp: , Rfl:  .  TRUEplus Lancets 33G MISC, Use to test blood sugar twice  daily. DX: E11.9, Disp: 200 each, Rfl: 3 .  vitamin C (ASCORBIC ACID) 500 MG tablet, Take 1,000 mg by mouth daily., Disp: , Rfl:  .  Wound Dressings (MEDIHONEY WOUND/BURN DRESSING) GEL, Apply to affected are 3 times a week, and cover with sterile dressing., Disp: 15 mL, Rfl: 2  Social History   Tobacco Use  Smoking Status Former Smoker  . Packs/day: 0.10  . Years: 5.00  . Pack years: 0.50  . Types: Cigarettes  . Quit date: 05/10/2015  . Years since quitting: 4.3  Smokeless Tobacco Never Used    Allergies  Allergen Reactions  . Bactrim [Sulfamethoxazole-Trimethoprim] Rash   Objective:   There were no vitals filed for this visit. There is no height or weight on file to calculate BMI. Constitutional Well developed. Well nourished.  Vascular Dorsalis pedis  pulses palpable bilaterally. Posterior tibial pulses palpable bilaterally. Capillary refill normal to all digits.  No cyanosis or clubbing noted. Pedal hair growth normal.  Neurologic Normal speech. Oriented to person, place, and time. Protective sensation absent  Dermatologic Wound Location: Right 4th/5th toe area Wound Base: Granular/Healthy Peri-wound: Normal Exudate: None: wound tissue dry Wound Measurements: 0.5x0.3  Orthopedic: No pain to palpation either foot.   Radiographs: None Assessment:   1. Other granulomatous disorders of the skin and subcutaneous tissue   2. Ulcer of toe of right foot, with necrosis of bone ([place])    Plan:  Patient was evaluated and treated and all questions answered.  Ulcer Right foot -Healing well, dorsal area cleansed, hypergranular area cauterized with silver nitrate -Will make appt for fabrication of DM shoes and toe filler  Procedure: Chemical Cauterization of Granulation Tissue Rationale: Cauterize granular wound base to promote healing.  Wound Measurements: 0.5 cm x 0.3 cm x 0.1 cm  Instrumentation: Silver nitrate stick x1 Dressing: Dry, sterile, compression dressing. Disposition: Patient tolerated procedure well. Patient to return in 1 week for follow-up.   Return in about 1 month (around 10/15/2019).

## 2019-09-18 ENCOUNTER — Telehealth: Payer: Self-pay | Admitting: Internal Medicine

## 2019-09-18 MED ORDER — TRUE METRIX BLOOD GLUCOSE TEST VI STRP: ORAL_STRIP | 3 refills | Status: DC

## 2019-09-18 NOTE — Telephone Encounter (Signed)
Pt informed rx has been sent.  

## 2019-09-18 NOTE — Telephone Encounter (Signed)
Patient called requesting a refill on his glucose blood (TRUE METRIX BLOOD GLUCOSE TEST) test strip to [facility]. He said that he is almost out and they do not have any refills on file.

## 2019-09-24 ENCOUNTER — Ambulatory Visit: Payer: HMO | Admitting: Orthotics

## 2019-09-24 ENCOUNTER — Other Ambulatory Visit: Payer: Self-pay

## 2019-09-24 DIAGNOSIS — L97514 Non-pressure chronic ulcer of other part of right foot with necrosis of bone: Secondary | ICD-10-CM

## 2019-09-24 DIAGNOSIS — Z9889 Other specified postprocedural states: Secondary | ICD-10-CM

## 2019-09-24 DIAGNOSIS — L97512 Non-pressure chronic ulcer of other part of right foot with fat layer exposed: Secondary | ICD-10-CM

## 2019-09-24 NOTE — Progress Notes (Signed)
Patient was measured for med necessity extra depth shoes (x2) w/ 3 custom f/o. One L5000 Patient was measured w/ [place]annock to determine size and width.  Foam impression mold was achieved and deemed appropriate for fabrication of  cmfo.   See DPM chart notes for further documentation and dx codes for determination of medical necessity.  Appropriate forms will be sent to PCP to verify and sign off on medical necessity.

## 2019-09-27 ENCOUNTER — Telehealth: Payer: Self-pay | Admitting: Podiatry

## 2019-09-27 NOTE — Telephone Encounter (Signed)
Jason [name] from [facility] advantage left message stating he need a diabetic dx for auth to be approved for the diabetic shoes/inserts and toe filler insert. He did not see it in the office note.  I returned call and explained that if he looked in the history part of the note it does state pt is type 2 diabetic. He is going to fax authorization upon approval asap.

## 2019-09-28 ENCOUNTER — Telehealth: Payer: Self-pay | Admitting: Internal Medicine

## 2019-09-28 MED ORDER — TRUE METRIX BLOOD GLUCOSE TEST VI STRP: ORAL_STRIP | 3 refills | Status: DC

## 2019-09-28 NOTE — Telephone Encounter (Signed)
Medication: glucose blood (TRUE METRIX BLOOD GLUCOSE TEST) test strip   Has the patient contacted their pharmacy? Yes  (Agent: If no, request that the patient contact the pharmacy for the refill.) (Agent: If yes, when and what did the pharmacy advise?)  Preferred Pharmacy (with phone number or street name): [facility] - [place], [place] [phone] (Phone) [phone] (Fax)    Agent: Please be advised that RX refills may take up to 3 business days. We ask that you follow-up with your pharmacy.

## 2019-09-28 NOTE — Telephone Encounter (Signed)
Pt mail order pharmacy changed to [facility]. Previous request was to send to [facility]. [facility] is no longer preferred (per call to [facility]).   Erx resent as requested.

## 2019-10-15 ENCOUNTER — Telehealth: Payer: Self-pay | Admitting: Internal Medicine

## 2019-10-15 DIAGNOSIS — IMO0002 Reserved for concepts with insufficient information to code with codable children: Secondary | ICD-10-CM

## 2019-10-15 DIAGNOSIS — E1151 Type 2 diabetes mellitus with diabetic peripheral angiopathy without gangrene: Secondary | ICD-10-CM

## 2019-10-15 DIAGNOSIS — E1165 Type 2 diabetes mellitus with hyperglycemia: Secondary | ICD-10-CM

## 2019-10-15 MED ORDER — BLOOD GLUCOSE MONITOR KIT: PACK | 0 refills | Status: DC

## 2019-10-15 MED ORDER — TRUE METRIX BLOOD GLUCOSE TEST VI STRP: ORAL_STRIP | 3 refills | Status: DC

## 2019-10-15 MED ORDER — TRUEPLUS LANCETS 33G MISC: 3 refills | Status: DC

## 2019-10-15 NOTE — Telephone Encounter (Signed)
erx sent

## 2019-10-15 NOTE — Telephone Encounter (Signed)
rx refill  glucose blood (TRUE METRIX BLOOD GLUCOSE TEST) test strip  Blood Glucose Monitoring Suppl (ONETOUCH VERIO) w/Device KIT   TRUEplus Lancets [facility]   Pharmacy [facility] [address], [address] AT [place] [phone] (Phone) [phone] (Fax)

## 2019-10-15 NOTE — Telephone Encounter (Signed)
lvm for pt informing erx has been sent as requested.

## 2019-10-18 ENCOUNTER — Ambulatory Visit (INDEPENDENT_AMBULATORY_CARE_PROVIDER_SITE_OTHER): Payer: HMO | Admitting: Podiatry

## 2019-10-18 ENCOUNTER — Other Ambulatory Visit: Payer: Self-pay

## 2019-10-18 DIAGNOSIS — M21961 Unspecified acquired deformity of right lower leg: Secondary | ICD-10-CM

## 2019-10-18 DIAGNOSIS — L97519 Non-pressure chronic ulcer of other part of right foot with unspecified severity: Secondary | ICD-10-CM

## 2019-10-18 DIAGNOSIS — Z89431 Acquired absence of right foot: Secondary | ICD-10-CM | POA: Diagnosis not present

## 2019-11-05 ENCOUNTER — Other Ambulatory Visit: Payer: Self-pay | Admitting: Internal Medicine

## 2019-11-05 ENCOUNTER — Other Ambulatory Visit: Payer: Self-pay

## 2019-11-05 DIAGNOSIS — IMO0002 Reserved for concepts with insufficient information to code with codable children: Secondary | ICD-10-CM

## 2019-11-05 DIAGNOSIS — E1151 Type 2 diabetes mellitus with diabetic peripheral angiopathy without gangrene: Secondary | ICD-10-CM

## 2019-11-05 DIAGNOSIS — I70209 Unspecified atherosclerosis of native arteries of extremities, unspecified extremity: Secondary | ICD-10-CM

## 2019-11-05 DIAGNOSIS — E1165 Type 2 diabetes mellitus with hyperglycemia: Secondary | ICD-10-CM

## 2019-11-05 MED ORDER — METFORMIN HCL 500 MG PO TABS: 1000.0000 mg | ORAL_TABLET | Freq: Two times a day (BID) | ORAL | 1 refills | Status: DC

## 2019-11-05 NOTE — Patient Outreach (Signed)
[facility]) Care Management Chronic Special Needs Program  11/05/2019  Name: Jason [name] DOB: [birth year]  MRN: [id number]  [name] is enrolled in a chronic special needs plan for Diabetes. Reviewed and updated care plan.  Subjective: Client states that his foot is healed.  States he is to get diabetic shoes soon.  States he has not started back walking for exercise yet.  States his blood sugars range from 83-170 in the morning.  States he is not always fasting in the morning when he checks his sugar as he frequently has a snack in the night. Denies any falls.  States he has his Advanced Directives forms completed but has not gone to get notarized yet.  Denies any issues with transportation or meal prep.  Goals Addressed            This Visit's Progress   . Advanced Care Planning complete by next 9 months(continue 08/22/19)   Not on track    Take forms to be notarized    . COMPLETED: Client understands the importance of follow-up with providers by attending scheduled visits   On track    Keeping scheduled appointments    . Client will report no worsening of symptoms related to heart disease within the next 9 months(continue 08/22/19)   No change    Reports not change in heart condition    . COMPLETED: Client will use Assistive Devices as needed and verbalize understanding of device use       Reports using glucometer without issue    . COMPLETED: Client will verbalize understanding of treatment plan for impaired skin integrity and follow up with provider by next 3 months   On track    Wound healed    . COMPLETED: Client/Caregiver will verbalize understanding of instructions related to self-care and safety   On track    No recent falls    . COMPLETED: Decrease inpatient admissions/ readmissions with in the next year   Not on track    Two observation admissions    . COMPLETED: Decrease inpatient diabetes admissions/readmissions with in the year   Not on  track    Two observation admissions    . HEMOGLOBIN A1C < 7.0        Diabetes self management actions:  Glucose monitoring per provider recommendations  Eat Healthy  Check feet daily  Visit provider every 3-6 months as directed  Hbg A1C level every 3-6 months.  Eye Exam yearly    . COMPLETED: Maintain timely refills of diabetic medication as prescribed within the year .   On track    Maintaining refills of medications    . COMPLETED: Visit Primary Care Provider or Endocrinologist at least 2 times per year    On track    Completed primary care provider 04/18/19, 07/19/19, 09/13/19     Client is not meeting diabetes self management goal of hemoglobin A1C of <7% with last reading of 7% Reviewed CBG goals for fasting and post prandial  Reinforced to follow a low CHO low sodium diet Reviewed routine foot care Reinforced fall and safety precautions Encouraged to get Advanced Directives notarized Reviewed number for 24-hour nurse Line Reviewed COVID 19 precautions Plan:  Send successful outreach letter with a copy of their individualized care plan and Send individual care plan to provider  Chronic care management coordinator will outreach in:  3-6 Months per tier level  Will refer to:     Peter Garter RN, BSN,CCM, CDE  Chronic Care Management Coordinator [place] [facility] [phone]

## 2019-11-07 ENCOUNTER — Telehealth: Payer: Self-pay | Admitting: Internal Medicine

## 2019-11-07 DIAGNOSIS — E1151 Type 2 diabetes mellitus with diabetic peripheral angiopathy without gangrene: Secondary | ICD-10-CM

## 2019-11-07 DIAGNOSIS — IMO0002 Reserved for concepts with insufficient information to code with codable children: Secondary | ICD-10-CM

## 2019-11-07 DIAGNOSIS — I70209 Unspecified atherosclerosis of native arteries of extremities, unspecified extremity: Secondary | ICD-10-CM

## 2019-11-07 DIAGNOSIS — E1165 Type 2 diabetes mellitus with hyperglycemia: Secondary | ICD-10-CM

## 2019-11-07 MED ORDER — METFORMIN HCL 500 MG PO TABS: 1000.0000 mg | ORAL_TABLET | Freq: Two times a day (BID) | ORAL | 1 refills | Status: DC

## 2019-11-07 NOTE — Telephone Encounter (Signed)
Contacted patient, he was advised that his refill for Metformin was refilled. He verbalized understanding.

## 2019-11-07 NOTE — Telephone Encounter (Signed)
Medication Refill - Medication: metFORMIN (GLUCOPHAGE) 500 MG tablet  Has the patient contacted their pharmacy? Yes.   Mail order pharmacy called and they said the instructing on how patient should take meds and the quantity do not match if that can please be corrected and send back to them.  (Agent: If no, request that the patient contact the pharmacy for the refill.) (Agent: If yes, when and what did the pharmacy advise?)  Preferred Pharmacy (with phone number or street name):[facility] - [place], [place]  Agent: Please be advised that RX refills may take up to 3 business days. We ask that you follow-up with your pharmacy.

## 2019-11-18 NOTE — Progress Notes (Signed)
Subjective:  Patient ID: Jason [name], male    DOB: 03-14-1941,  MRN: [id number]  Chief Complaint  Patient presents with  . Wound Check    Pt states feeling well, does have some occasional sharp pains in his right foot. Pt believes this is due to a callous in his plantar forefoot.    79 y.o. male presents for wound care. Hx as above.  Review of Systems: Negative except as noted in the HPI. Denies N/V/F/Ch.  Past Medical History:  Diagnosis Date  . Anxiety   . Asthma    in past, no inhaler now  . Benign neoplasm of colon   . Bone infection ([place])   . Cancer [facility])    bladder cancer  . COPD (chronic obstructive pulmonary disease) ([place])   . Depression   . Diverticulosis of colon (without mention of hemorrhage)   . DJD (degenerative joint disease)   . Early cataract   . Esophageal reflux   . Glaucoma   . Headache   . Hidradenitis   . History of BPH   . History of gynecomastia    Unilateral  . Hyperlipidemia   . Irritable bowel syndrome   . Memory loss   . Mitral stenosis    mild-moderate MS 01/2016  . MVP (mitral valve prolapse)   . Obesity, unspecified   . Pneumonia   . Sleep apnea    does not use  cpap or bipap .. no study  ever  . Type II or unspecified type diabetes mellitus without mention of complication, not stated as uncontrolled   . Unspecified venous (peripheral) insufficiency   . Wears dentures   . Wears glasses   . Wound drainage    RIGHT FOOT X 1 WEEK CHANGES DRESSING Q DAY WITH RED DRAINAGE    Current Outpatient Medications:  .  aspirin EC 81 MG tablet, Take 1 tablet (81 mg total) by mouth daily., Disp: 90 tablet, Rfl: 3 .  blood glucose meter kit and supplies KIT, Use to test blood sugar twice daily. DX: E11.9, Disp: 1 each, Rfl: 0 .  Blood Glucose Monitoring Suppl (ONETOUCH VERIO) w/Device KIT, 1 Act by Does not apply route 3 (three) times daily., Disp: 1 kit, Rfl: 2 .  collagenase (SANTYL) ointment, Apply 1 application topically daily. Right foot  ulcer measurement 3.0 x 2.0 x 0.1cm, Disp: 30 g, Rfl: 5 .  docusate sodium (COLACE) 100 MG capsule, Take 1 capsule (100 mg total) by mouth 2 (two) times daily. (Patient taking differently: Take 100 mg by mouth 2 (two) times daily as needed (constipation.). ), Disp: 10 capsule, Rfl: 0 .  famotidine (PEPCID) 20 MG tablet, Take 1 tablet (20 mg total) by mouth 2 (two) times daily. (Patient taking differently: Take 20 mg by mouth 2 (two) times daily as needed (heartburn/indigestion.). ), Disp: 10 tablet, Rfl: 0 .  glucose blood (TRUE METRIX BLOOD GLUCOSE TEST) test strip, Use to test blood sugar twice daily. DX: E11.9, Disp: 200 each, Rfl: 3 .  Multiple Vitamins-Minerals (MULTIVITAMIN WITH MINERALS) tablet, Take 1 tablet by mouth daily., Disp: , Rfl:  .  simvastatin (ZOCOR) 40 MG tablet, Take 1 tablet (40 mg total) by mouth daily at 6 PM., Disp: 90 tablet, Rfl: 1 .  Skin Protectants, Misc. (EUCERIN) cream, Apply 1 application topically as needed for dry skin (ear itching.)., Disp: , Rfl:  .  TRUEplus Lancets 33G MISC, Use to test blood sugar twice daily. DX: E11.9, Disp: 200 each, Rfl: 3 .  vitamin C (ASCORBIC ACID) 500 MG tablet, Take 1,000 mg by mouth daily., Disp: , Rfl:  .  Wound Dressings (MEDIHONEY WOUND/BURN DRESSING) GEL, Apply to affected are 3 times a week, and cover with sterile dressing., Disp: 15 mL, Rfl: 2 .  metFORMIN (GLUCOPHAGE) 500 MG tablet, Take 2 tablets (1,000 mg total) by mouth 2 (two) times daily with a meal., Disp: 360 tablet, Rfl: 1  Social History   Tobacco Use  Smoking Status Former Smoker  . Packs/day: 0.10  . Years: 5.00  . Pack years: 0.50  . Types: Cigarettes  . Quit date: 05/10/2015  . Years since quitting: 4.5  Smokeless Tobacco Never Used    Allergies  Allergen Reactions  . Bactrim [Sulfamethoxazole-Trimethoprim] Rash   Objective:   There were no vitals filed for this visit. There is no height or weight on file to calculate BMI. Constitutional Well  developed. Well nourished.  Vascular Dorsalis pedis pulses palpable bilaterally. Posterior tibial pulses palpable bilaterally. Capillary refill normal to all digits.  No cyanosis or clubbing noted. Pedal hair growth normal.  Neurologic Normal speech. Oriented to person, place, and time. Protective sensation absent  Dermatologic  ulcer right foot healed with thin epithelialization  Orthopedic: No pain to palpation either foot.   Radiographs: None Assessment:   1. Ulcer of right foot, unspecified ulcer stage ([place])   2. History of transmetatarsal amputation of right foot ([place])    Plan:  Patient was evaluated and treated and all questions answered.  Ulcer Right foot, Metatarsal deformity, HPK -Wound appears healed.  Transition to DM shoes and toe filler once they are available.  Follow-up should the ulcer recur   No follow-ups on file.

## 2019-11-20 ENCOUNTER — Other Ambulatory Visit (HOSPITAL_COMMUNITY): Payer: HMO

## 2019-11-23 ENCOUNTER — Other Ambulatory Visit: Payer: Self-pay

## 2019-11-23 NOTE — Patient Outreach (Signed)
  [facility]) Care Management Chronic Special Needs Program    11/23/2019  Name: Jason [name], DOB: Jun 23, 1941  MRN: [id number]   [name] is enrolled in a chronic special needs plan for Diabetes   Case closed as client has disenrolled from [place] to send case closure letter to [place] ([facility]) will notify client of dis-enrollment from [place], Jackquline Denmark, [place] Management Coordinator [place] [facility] [phone]

## 2019-12-13 DIAGNOSIS — Z7189 Other specified counseling: Secondary | ICD-10-CM | POA: Insufficient documentation

## 2019-12-13 NOTE — Progress Notes (Signed)
Cardiology Office Note   Date:  12/14/2019   ID:  Jason [name], DOB 1940/12/23, MRN [id number]  PCP:  [name], MD  Cardiologist:   Minus Breeding, MD   Chief Complaint  Patient presents with  . Mitral Stenosis    4 months.      History of Present Illness: Jason [name] is a 79 y.o. male who I saw in 2017.  He had MVP and moderate MR. Since I last saw him he had resection of his fourth and fifth toes on his right foot and now has no toes on the foot at all.  He has been a little limited in his walking because of.  He denies any cardiovascular symptoms however.  Is not having any new shortness of breath, PND or orthopnea.  He is not having any palpitations, presyncope or syncope.  He has had no weight gain or edema.  He denies any chest pressure, neck or arm discomfort.   Past Medical History:  Diagnosis Date  . Anxiety   . Asthma    in past, no inhaler now  . Benign neoplasm of colon   . Bone infection ([place])   . Cancer [facility])    bladder cancer  . COPD (chronic obstructive pulmonary disease) ([place])   . Depression   . Diverticulosis of colon (without mention of hemorrhage)   . DJD (degenerative joint disease)   . Early cataract   . Esophageal reflux   . Glaucoma   . Headache   . Hidradenitis   . History of BPH   . History of gynecomastia    Unilateral  . Hyperlipidemia   . Irritable bowel syndrome   . Memory loss   . Mitral stenosis    mild-moderate MS 01/2016  . MVP (mitral valve prolapse)   . Obesity, unspecified   . Pneumonia   . Sleep apnea    does not use  cpap or bipap .. no study  ever  . Type II or unspecified type diabetes mellitus without mention of complication, not stated as uncontrolled   . Unspecified venous (peripheral) insufficiency   . Wears dentures   . Wears glasses   . Wound drainage    RIGHT FOOT X 1 WEEK CHANGES DRESSING Q DAY WITH RED DRAINAGE    Past Surgical History:  Procedure Laterality Date  . ACHILLES TENDON SURGERY  Right 05/17/2019   Procedure: ACHILLES LENGTHENING/KIDNER;  Surgeon: Evelina Bucy, DPM;  Location: [place];  Service: Podiatry;  Laterality: Right;  . AMPUTATION  08/09/2012   Procedure: AMPUTATION DIGIT;  Surgeon: Newt Minion, MD;  Location: [place];  Service: Orthopedics;  Laterality: Right;  Amputation right Great Toe MTP Joint  . AMPUTATION  10/17/2012   Procedure: AMPUTATION DIGIT;  Surgeon: Newt Minion, MD;  Location: [place];  Service: Orthopedics;  Laterality: Right;  Right Foot 3rd Toe Amputation at Metatarsophalangeal Joint  . AMPUTATION TOE Right 05/17/2019   Procedure: AMPUTATION TOE Metatarsal Phalangeal Joint   FOURTH AND FIFTH, FILLETED TOE FLAP;  Surgeon: Evelina Bucy, DPM;  Location: [place];  Service: Podiatry;  Laterality: Right;  . CHOLECYSTECTOMY N/A 06/14/2018   Procedure: LAPAROSCOPIC CHOLECYSTECTOMY WITH INTRAOPERATIVE CHOLANGIOGRAM;  Surgeon: Kieth Brightly Arta Bruce, MD;  Location: [facility];  Service: General;  Laterality: N/A;  . CIRCUMCISION  06/2100   For Phimosis - Dr Hartley Barefoot  . COLON SURGERY    . CYSTOSCOPY W/ RETROGRADES Bilateral 07/07/2018   Procedure: CYSTOSCOPY WITH BILATERAL  RETROGRADE PYELOGRAM;  Surgeon: Ardis Hughs, MD;  Location: [facility];  Service: Urology;  Laterality: Bilateral;  . KNEE ARTHROSCOPY    . LAPAROSCOPIC LYSIS OF ADHESIONS  06/14/2018   Procedure: LAPAROSCOPIC LYSIS OF ADHESIONS;  Surgeon: Kieth Brightly Arta Bruce, MD;  Location: [facility];  Service: General;;  . LAPAROTOMY  07/20/11  . LASER ABLATION  06/2009   Left Greater Saphenous vein -Dr Donnetta Hutching  . METATARSAL OSTEOTOMY Right 05/17/2019   Procedure: METATARSLSECTOMY FOURTH DIGIT;  Surgeon: Evelina Bucy, DPM;  Location: [place];  Service: Podiatry;  Laterality: Right;  . MULTIPLE TOOTH EXTRACTIONS    . TOE AMPUTATION  06/2009   x3Right foot second toe -Dr Beola Cord  . TONSILLECTOMY    . TRANSURETHRAL RESECTION OF BLADDER TUMOR N/A 07/07/2018   Procedure: TRANSURETHRAL RESECTION OF BLADDER TUMOR  (TURBT) WITH POST OP INSTILLATION OF GEMCITABIN;  Surgeon: Ardis Hughs, MD;  Location: [facility];  Service: Urology;  Laterality: N/A;  . WOUND DEBRIDEMENT Right 07/25/2019   Procedure: Right foot wound debridement with application of skin graft substitute;  Surgeon: Evelina Bucy, DPM;  Location: [facility];  Service: Podiatry;  Laterality: Right;     Current Outpatient Medications  Medication Sig Dispense Refill  . aspirin EC 81 MG tablet Take 1 tablet (81 mg total) by mouth daily. 90 tablet 3  . blood glucose meter kit and supplies KIT Use to test blood sugar twice daily. DX: E11.9 1 each 0  . Blood Glucose Monitoring Suppl (ONETOUCH VERIO) w/Device KIT 1 Act by Does not apply route 3 (three) times daily. 1 kit 2  . glucose blood (TRUE METRIX BLOOD GLUCOSE TEST) test strip Use to test blood sugar twice daily. DX: E11.9 200 each 3  . metFORMIN (GLUCOPHAGE) 500 MG tablet Take 2 tablets (1,000 mg total) by mouth 2 (two) times daily with a meal. 360 tablet 1  . Multiple Vitamins-Minerals (MULTIVITAMIN WITH MINERALS) tablet Take 1 tablet by mouth daily.    . simvastatin (ZOCOR) 40 MG tablet Take 1 tablet (40 mg total) by mouth daily at 6 PM. 90 tablet 1  . TRUEplus Lancets 33G MISC Use to test blood sugar twice daily. DX: E11.9 200 each 3  . vitamin C (ASCORBIC ACID) 500 MG tablet Take 1,000 mg by mouth daily.    . Wound Dressings (MEDIHONEY WOUND/BURN DRESSING) GEL Apply to affected are 3 times a week, and cover with sterile dressing. 15 mL 2   No current facility-administered medications for this visit.    Allergies:   Bactrim [sulfamethoxazole-trimethoprim]    ROS:  Please see the history of present illness.   Otherwise, review of systems are positive for none.   All other systems are reviewed and negative.    PHYSICAL EXAM: VS:  BP 110/62   Pulse 67   Temp (!) 95.2 F (35.1 C)   Ht '6\' 8"'  (2.032 m)   Wt 297 lb 6.4 oz (134.9 kg)   BMI 32.67 kg/m  , BMI Body mass index is 32.67  kg/m. GENERAL:  Well appearing NECK:  No jugular venous distention, waveform within normal limits, carotid upstroke brisk and symmetric, no bruits, no thyromegaly LUNGS:  Clear to auscultation bilaterally CHEST:  Unremarkable HEART:  PMI not displaced or sustained,S1 and S2 within normal limits, no S3, no S4, no clicks, no rubs, 2 out of 6 apical systolic murmur early and mid but not holosystolic, no diastolic murmurs appreciated ABD:  Flat, positive bowel sounds normal in frequency  in pitch, no bruits, no rebound, no guarding, no midline pulsatile mass, no hepatomegaly, no splenomegaly EXT:  2 plus pulses throughout, no edema, no cyanosis no clubbing, absent toes right foot.    EKG:  EKG is ordered today. Sinus rhythm, rate 67, axis within normal limits, intervals within normal limits, no acute ST-T wave changes.   Recent Labs: 04/18/2019: TSH 0.85 05/17/2019: ALT 18 07/19/2019: BUN 17; Creatinine, Ser 1.07; Hemoglobin 13.0; Platelets 167.0; Potassium 4.1; Sodium 138    Lipid Panel    Component Value Date/Time   CHOL 137 04/18/2019 1035   TRIG 80.0 04/18/2019 1035   HDL 52.40 04/18/2019 1035   CHOLHDL 3 04/18/2019 1035   VLDL 16.0 04/18/2019 1035   LDLCALC 68 04/18/2019 1035      Wt Readings from Last 3 Encounters:  12/14/19 297 lb 6.4 oz (134.9 kg)  09/13/19 299 lb (135.6 kg)  07/25/19 298 lb (135.2 kg)      Other studies Reviewed: Additional studies/ records that were reviewed today include: Echo September 2020. Review of the above records demonstrates:  See below    ASSESSMENT AND PLAN:  Mitral stenosis:  Mod to severe MS.  He has had mild mitral regurgitation.  This was followed up in September.  I will repeat an echocardiogram this year in September.  He has no symptoms.  There is no indication for valve therapy at this time.   Hyperlipidemia: His LDL 68.  No change in therapy.   DM2:   A1c was 7.0 which is down from 9.0.  He is followed by Dr. Ronnald Ramp.  COVID  EDUCATION: He has had no sign of his vaccine.   Current medicines are reviewed at length with the patient today.  The patient does not have concerns regarding medicines.  The following changes have been made:  no change  Labs/ tests ordered today include:   Orders Placed This Encounter  Procedures  . EKG 12-Lead  . ECHOCARDIOGRAM COMPLETE     Disposition:   FU with me on one year.    Signed, Minus Breeding, MD  12/14/2019 11:26 AM    [facility]

## 2019-12-14 ENCOUNTER — Ambulatory Visit: Payer: Medicare HMO | Admitting: Cardiology

## 2019-12-14 ENCOUNTER — Encounter: Payer: Self-pay | Admitting: Cardiology

## 2019-12-14 ENCOUNTER — Other Ambulatory Visit: Payer: Self-pay

## 2019-12-14 VITALS — BP 110/62 | HR 67 | Temp 95.2°F | Ht >= 80 in | Wt 297.4 lb

## 2019-12-14 DIAGNOSIS — Z7189 Other specified counseling: Secondary | ICD-10-CM | POA: Diagnosis not present

## 2019-12-14 DIAGNOSIS — I70209 Unspecified atherosclerosis of native arteries of extremities, unspecified extremity: Secondary | ICD-10-CM

## 2019-12-14 DIAGNOSIS — E1151 Type 2 diabetes mellitus with diabetic peripheral angiopathy without gangrene: Secondary | ICD-10-CM | POA: Diagnosis not present

## 2019-12-14 DIAGNOSIS — I059 Rheumatic mitral valve disease, unspecified: Secondary | ICD-10-CM

## 2019-12-14 DIAGNOSIS — E785 Hyperlipidemia, unspecified: Secondary | ICD-10-CM

## 2019-12-14 NOTE — Patient Instructions (Signed)
Medication Instructions:  No Changes *If you need a refill on your cardiac medications before your next appointment, please call your pharmacy*  Lab Work: None If you have labs (blood work) drawn today and your tests are completely normal, you will receive your results only by: Marland Kitchen MyChart Message (if you have MyChart) OR . A paper copy in the mail If you have any lab test that is abnormal or we need to change your treatment, we will call you to review the results.  Testing/Procedures: Your physician has requested that you have an echocardiogram IN [place]. Echocardiography is a painless test that uses sound waves to create images of your heart. It provides your doctor with information about the size and shape of your heart and how well your heart's chambers and valves are working. This procedure takes approximately one hour. There are no restrictions for this procedure. [place]  Follow-Up: At [facility], you and your health needs are our priority.  As part of our continuing mission to provide you with exceptional heart care, we have created designated Provider Care Teams.  These Care Teams include your primary Cardiologist (physician) and Advanced Practice Providers (APPs -  Physician Assistants and Nurse Practitioners) who all work together to provide you with the care you need, when you need it.  Your next appointment:   1 year(s)  You will receive a reminder letter in the mail two months in advance. If you don't receive a letter, please call our office to schedule the follow-up appointment.   The format for your next appointment:   In Person  Provider:   Minus Breeding, MD

## 2019-12-17 ENCOUNTER — Telehealth: Payer: Self-pay | Admitting: Cardiology

## 2019-12-17 NOTE — Telephone Encounter (Signed)
Incoming call from [place] in Psych[facility], counselling. Call transferred to triage nurse. Spoke with pt who states he has had CP episodes for 1 month. He states last episode was at 3 am this morning and this lasted for a few seconds. Per pt CP usually lasts a few seconds. He rates CP this morning at 4 out of 10 on a scale from 1-10. Pt denies having any other symptoms during episodes. He states his CP usually center of chest and radiates to his back, but at times it 'moves up toward left side of chest'. Pt unable to provide vitals. He does state CP can depend on what he eats and that he has noticed that when he eats mixed nuts he has had CP afterward. Pt unsure of how long after consumption that CP begins. He states he has noticed that he has CP after consuming things that 'don't sit right with [his] body'. Pt does have Hx of esophageal reflux and IBS in chart. Pt states he has been prescribed a medication that starts with an 'M' in the past and Pepcid for issues related to heartburn/indigestion but he wanted to r/o cardiac issues. Pt last see 1/29. Informed pt that message to be routed to Dr. Percival Spanish to advise. Instructed pt to call 911 for CP that returns and increases in severity and/or does not go away and that may be accompanied by sx such as SOB, lightheadedness, palpitations, N/V, etc

## 2019-12-17 NOTE — Telephone Encounter (Signed)
New Message  Pt c/o of Chest Pain: STAT if CP now or developed within 24 hours  1. Are you having CP right now? Chest pains around 3:00 am  2. Are you experiencing any other symptoms (ex. SOB, nausea, vomiting, sweating)? No  3. How long have you been experiencing CP? 1 Month  4. Is your CP continuous or coming and going? Coming and going  5. Have you taken Nitroglycerin? No ?

## 2019-12-20 ENCOUNTER — Ambulatory Visit (INDEPENDENT_AMBULATORY_CARE_PROVIDER_SITE_OTHER): Payer: Medicare HMO | Admitting: Orthotics

## 2019-12-20 ENCOUNTER — Other Ambulatory Visit: Payer: Self-pay

## 2019-12-20 ENCOUNTER — Ambulatory Visit: Payer: HMO | Admitting: Podiatry

## 2019-12-20 DIAGNOSIS — E1169 Type 2 diabetes mellitus with other specified complication: Secondary | ICD-10-CM

## 2019-12-20 DIAGNOSIS — E08621 Diabetes mellitus due to underlying condition with foot ulcer: Secondary | ICD-10-CM

## 2019-12-20 DIAGNOSIS — L84 Corns and callosities: Secondary | ICD-10-CM

## 2019-12-20 DIAGNOSIS — B351 Tinea unguium: Secondary | ICD-10-CM

## 2019-12-20 DIAGNOSIS — L97519 Non-pressure chronic ulcer of other part of right foot with unspecified severity: Secondary | ICD-10-CM | POA: Diagnosis not present

## 2019-12-20 DIAGNOSIS — M21961 Unspecified acquired deformity of right lower leg: Secondary | ICD-10-CM

## 2019-12-20 DIAGNOSIS — E1142 Type 2 diabetes mellitus with diabetic polyneuropathy: Secondary | ICD-10-CM

## 2019-12-20 DIAGNOSIS — Z89431 Acquired absence of right foot: Secondary | ICD-10-CM

## 2019-12-20 NOTE — Progress Notes (Signed)
Patient came in today to pick up diabetic shoes and custom inserts and L5000 Transmet filler.  Same was well pleased with fit and function.   The patient could ambulate without any discomfort; there were no signs of any quality issues. The foot ortheses offered full contact with plantar surface and contoured the arch well.   The shoes fit well with no heel slippage and areas of pressure concern.   Patient advised to contact us if any problems arise.  Patient also advised on how to report any issues.

## 2019-12-22 IMAGING — US US ABDOMEN LIMITED
1 series · 14 of 25 positions shown · non-contrast
Comparison: 06/09/2018

CLINICAL DATA: Elevated LFTs and bilirubin

EXAM:
ULTRASOUND ABDOMEN LIMITED RIGHT UPPER QUADRANT

[Series 1: us abdomen limited · 0.24mm/px · 14 of 51 slices shown]
[im 1/51]
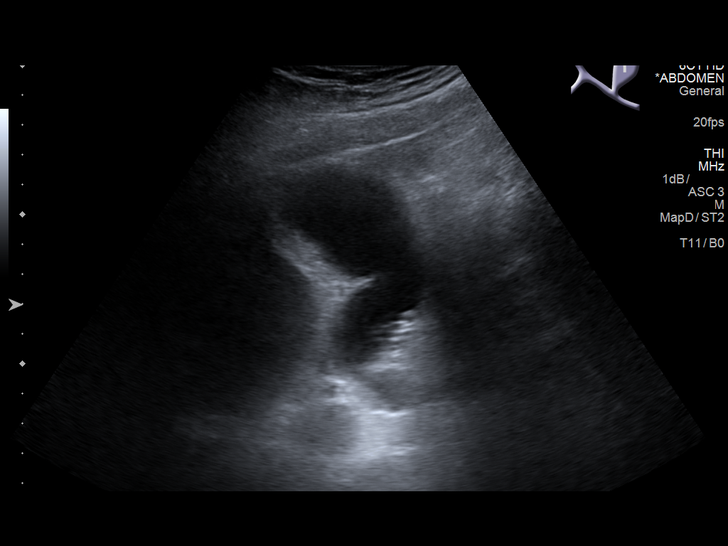
[im 5/51]
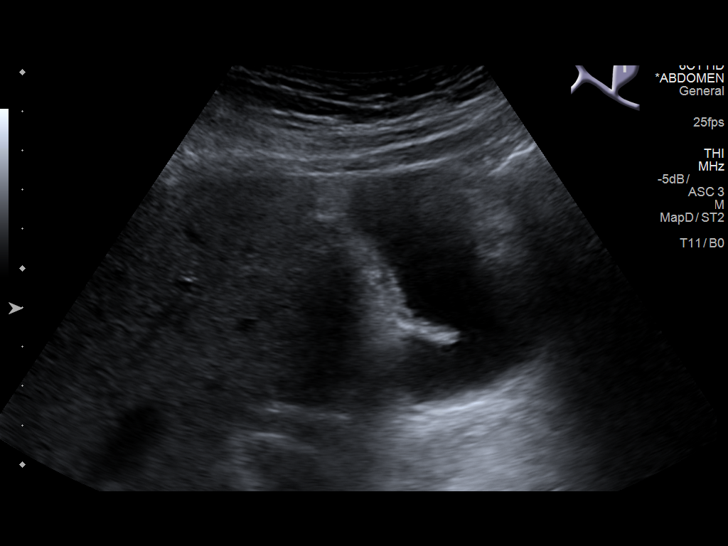
[im 9/51]
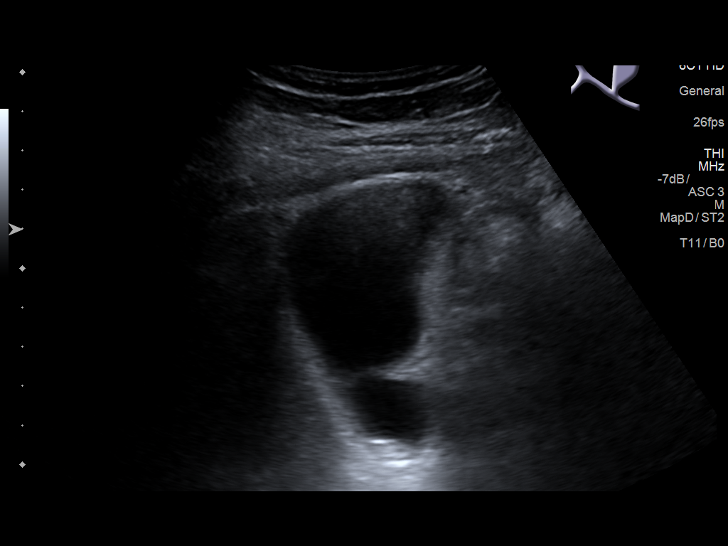
[im 13/51]
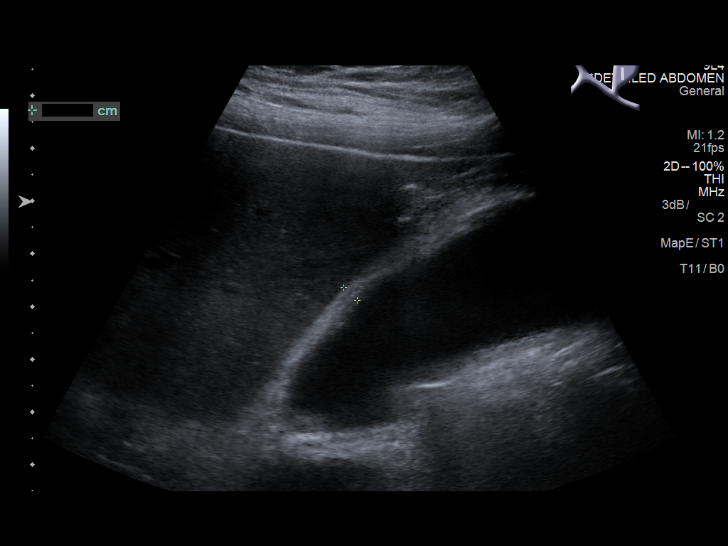
[im 17/51]
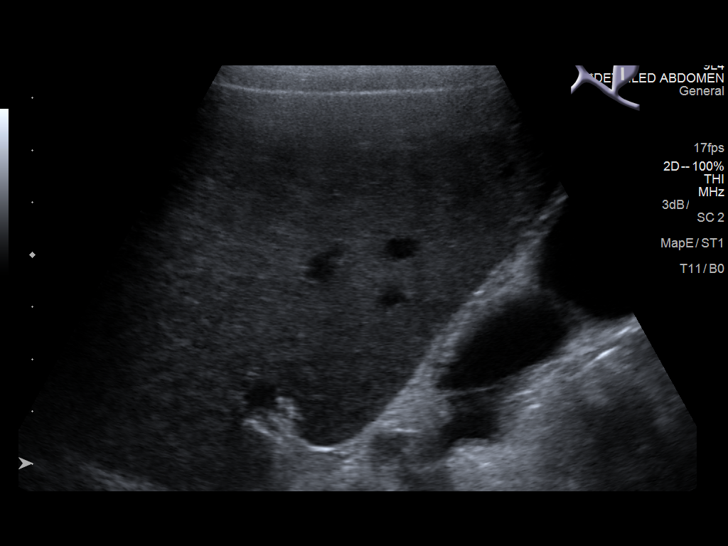
[im 19/51]
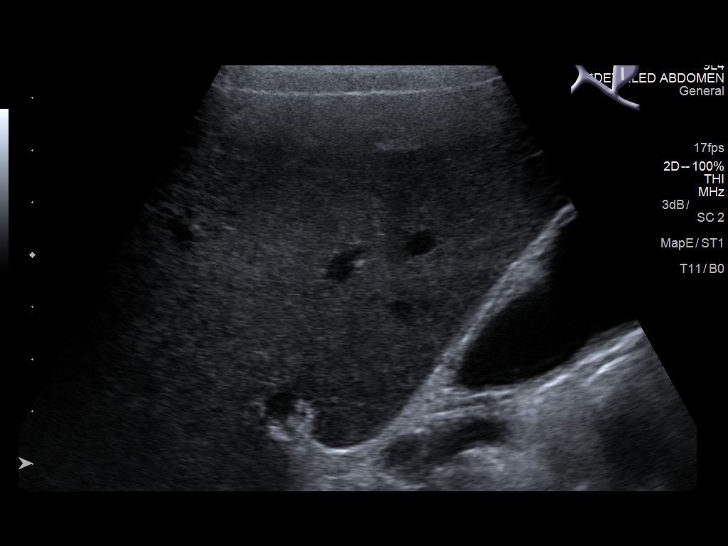
[im 23/51]
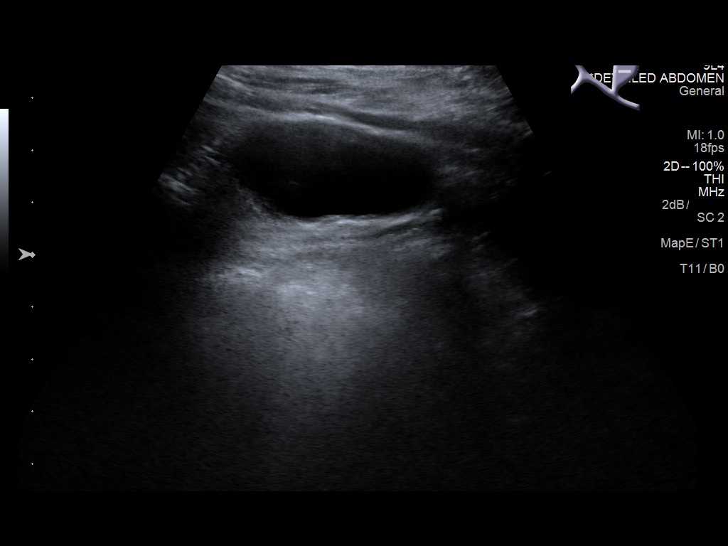
[im 28/51]
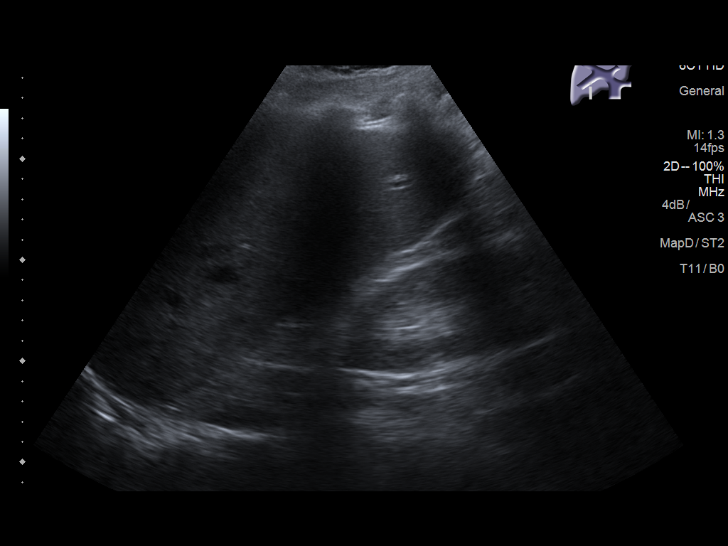
[im 32/51]
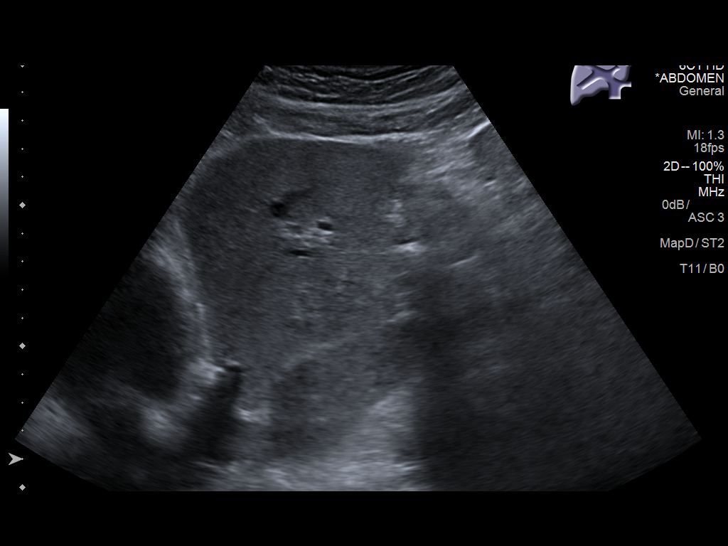
[im 34/51]
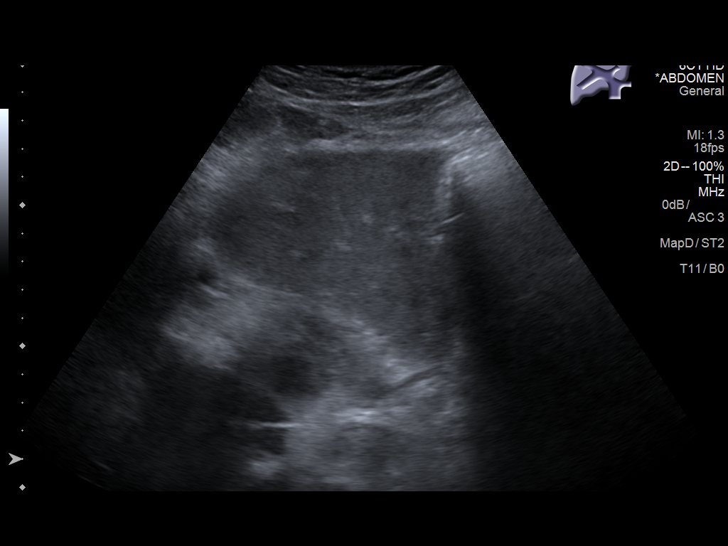
[im 38/51]
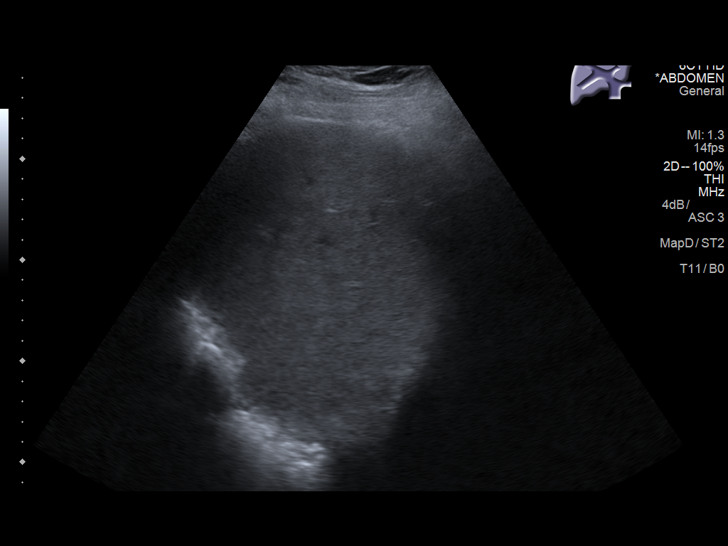
[im 42/51]
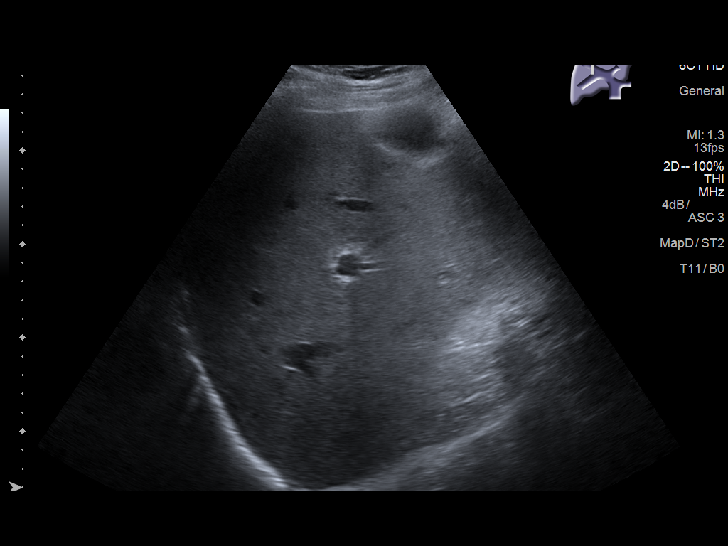
[im 46/51]
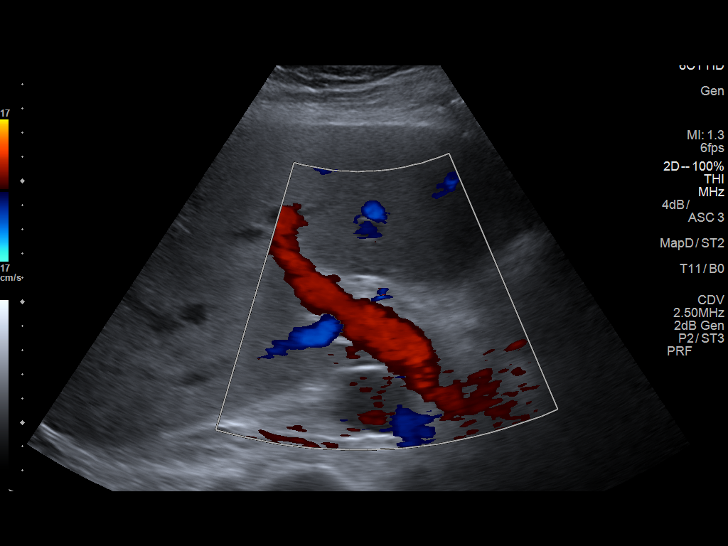
[im 51/51]
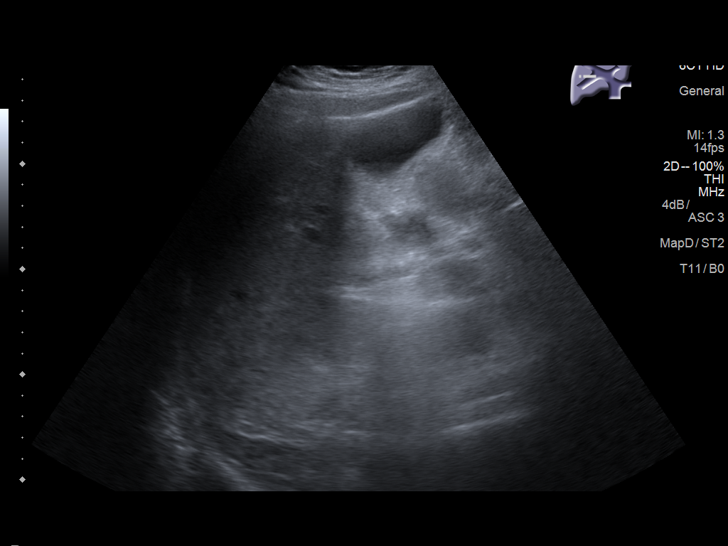

[14 of 25 positions shown; findings below may reference images not displayed]

FINDINGS: Gallbladder:

Thickened gallbladder wall. Gallbladder appears distended. Dependent
sludge with dependent tiny calculi 2 mm diameter. Minimal
pericholecystic fluid. No sonographic Murphy sign. Acute
cholecystitis questioned.

Common bile duct:

Diameter: 5 mm diameter, normal

Liver:

Normal appearance. Portal vein is patent on color Doppler imaging
with normal direction of blood flow towards the liver.

No ascites.
IMPRESSION: Sludge and tiny calculi in gallbladder with associated gallbladder
wall thickening and minimal pericholecystic fluid.

Question of acute cholecystitis is raised.

Consider follow-up radionuclide hepatobiliary imaging if clinically
indicated.

## 2019-12-23 NOTE — Telephone Encounter (Signed)
I would suggest that he follow with his primary care this week to discuss.

## 2019-12-25 NOTE — Telephone Encounter (Signed)
Spoke with Jason [name]. Dr. Percival Spanish advises Jason [name] to see his PCP about his complaint. Jason [name] reports he has an upcoming appointment with Dr. Ronnald Ramp. Jason [name] also states symptoms have resolved. Jason [name] to call back with any future cardiac issues or concerns.

## 2019-12-31 ENCOUNTER — Encounter: Payer: Self-pay | Admitting: Internal Medicine

## 2019-12-31 ENCOUNTER — Ambulatory Visit (INDEPENDENT_AMBULATORY_CARE_PROVIDER_SITE_OTHER): Payer: Medicare HMO | Admitting: Internal Medicine

## 2019-12-31 ENCOUNTER — Other Ambulatory Visit: Payer: Self-pay

## 2019-12-31 VITALS — BP 138/76 | HR 79 | Temp 98.7°F | Resp 16 | Ht >= 80 in | Wt 300.4 lb

## 2019-12-31 DIAGNOSIS — I739 Peripheral vascular disease, unspecified: Secondary | ICD-10-CM | POA: Diagnosis not present

## 2019-12-31 DIAGNOSIS — I70209 Unspecified atherosclerosis of native arteries of extremities, unspecified extremity: Secondary | ICD-10-CM

## 2019-12-31 DIAGNOSIS — E1151 Type 2 diabetes mellitus with diabetic peripheral angiopathy without gangrene: Secondary | ICD-10-CM | POA: Diagnosis not present

## 2019-12-31 LAB — POCT GLYCOSYLATED HEMOGLOBIN (HGB A1C): Hemoglobin A1C: 6.9 % — AB (ref 4.0–5.6)

## 2019-12-31 NOTE — Patient Instructions (Signed)
Type 2 Diabetes Mellitus, Diagnosis, Adult Type 2 diabetes (type 2 diabetes mellitus) is a long-term (chronic) disease. In type 2 diabetes, one or both of these problems may be present:  The pancreas does not make enough of a hormone called insulin.  Cells in the body do not respond properly to insulin that the body makes (insulin resistance). Normally, insulin allows blood sugar (glucose) to enter cells in the body. The cells use glucose for energy. Insulin resistance or lack of insulin causes excess glucose to build up in the blood instead of going into cells. As a result, high blood glucose (hyperglycemia) develops. What increases the risk? The following factors may make you more likely to develop type 2 diabetes:  Having a family member with type 2 diabetes.  Being overweight or obese.  Having an inactive (sedentary) lifestyle.  Having been diagnosed with insulin resistance.  Having a history of prediabetes, gestational diabetes, or polycystic ovary syndrome (PCOS).  Being of American-Indian, African-American, Hispanic/Latino, or Asian/Pacific Islander descent. What are the signs or symptoms? In the early stage of this condition, you may not have symptoms. Symptoms develop slowly and may include:  Increased thirst (polydipsia).  Increased hunger(polyphagia).  Increased urination (polyuria).  Increased urination during the night (nocturia).  Unexplained weight loss.  Frequent infections that keep coming back (recurring).  Fatigue.  Weakness.  Vision changes, such as blurry vision.  Cuts or bruises that are slow to heal.  Tingling or numbness in the hands or feet.  Dark patches on the skin (acanthosis nigricans). How is this diagnosed? This condition is diagnosed based on your symptoms, your medical history, a physical exam, and your blood glucose level. Your blood glucose may be checked with one or more of the following blood tests:  A fasting blood glucose (FBG)  test. You will not be allowed to eat (you will fast) for 8 hours or longer before a blood sample is taken.  A random blood glucose test. This test checks blood glucose at any time of day regardless of when you ate.  An A1c (hemoglobin A1c) blood test. This test provides information about blood glucose control over the previous 2-3 months.  An oral glucose tolerance test (OGTT). This test measures your blood glucose at two times: ? After fasting. This is your baseline blood glucose level. ? Two hours after drinking a beverage that contains glucose. You may be diagnosed with type 2 diabetes if:  Your FBG level is 126 mg/dL (7.0 mmol/L) or higher.  Your random blood glucose level is 200 mg/dL (11.1 mmol/L) or higher.  Your A1c level is 6.5% or higher.  Your OGTT result is higher than 200 mg/dL (11.1 mmol/L). These blood tests may be repeated to confirm your diagnosis. How is this treated? Your treatment may be managed by a specialist called an endocrinologist. Type 2 diabetes may be treated by following instructions from your health care provider about:  Making diet and lifestyle changes. This may include: ? Following an individualized nutrition plan that is developed by a diet and nutrition specialist (registered dietitian). ? Exercising regularly. ? Finding ways to manage stress.  Checking your blood glucose level as often as told.  Taking diabetes medicines or insulin daily. This helps to keep your blood glucose levels in the healthy range. ? If you use insulin, you may need to adjust the dosage depending on how physically active you are and what foods you eat. Your health care provider will tell you how to adjust your dosage.    Taking medicines to help prevent complications from diabetes, such as: ? Aspirin. ? Medicine to lower cholesterol. ? Medicine to control blood pressure. Your health care provider will set individualized treatment goals for you. Your goals will be based on  your age, other medical conditions you have, and how you respond to diabetes treatment. Generally, the goal of treatment is to maintain the following blood glucose levels:  Before meals (preprandial): 80-130 mg/dL (4.4-7.2 mmol/L).  After meals (postprandial): below 180 mg/dL (10 mmol/L).  A1c level: less than 7%. Follow these instructions at home: Questions to ask your health care provider  Consider asking the following questions: ? Do I need to meet with a diabetes educator? ? Where can I find a support group for people with diabetes? ? What equipment will I need to manage my diabetes at home? ? What diabetes medicines do I need, and when should I take them? ? How often do I need to check my blood glucose? ? What number can I call if I have questions? ? When is my next appointment? General instructions  Take over-the-counter and prescription medicines only as told by your health care provider.  Keep all follow-up visits as told by your health care provider. This is important.  For more information about diabetes, visit: ? American Diabetes Association (ADA): www.diabetes.org ? American Association of Diabetes Educators (AADE): www.diabeteseducator.org Contact a health care provider if:  Your blood glucose is at or above 240 mg/dL (13.3 mmol/L) for 2 days in a row.  You have been sick or have had a fever for 2 days or longer, and you are not getting better.  You have any of the following problems for more than 6 hours: ? You cannot eat or drink. ? You have nausea and vomiting. ? You have diarrhea. Get help right away if:  Your blood glucose is lower than 54 mg/dL (3.0 mmol/L).  You become confused or you have trouble thinking clearly.  You have difficulty breathing.  You have moderate or large ketone levels in your urine. Summary  Type 2 diabetes (type 2 diabetes mellitus) is a long-term (chronic) disease. In type 2 diabetes, the pancreas does not make enough of a  hormone called insulin, or cells in the body do not respond properly to insulin that the body makes (insulin resistance).  This condition is treated by making diet and lifestyle changes and taking diabetes medicines or insulin.  Your health care provider will set individualized treatment goals for you. Your goals will be based on your age, other medical conditions you have, and how you respond to diabetes treatment.  Keep all follow-up visits as told by your health care provider. This is important. This information is not intended to replace advice given to you by your health care provider. Make sure you discuss any questions you have with your health care provider. Document Revised: 12/30/2017 Document Reviewed: 12/05/2015 Elsevier Patient Education  2020 [employer]  

## 2019-12-31 NOTE — Progress Notes (Signed)
Subjective:  Patient ID: Jason [name], male    DOB: [birth year]  Age: 79 y.o. MRN: [id number]  CC: Diabetes  This visit occurred during the SARS-CoV-2 public health emergency.  Safety protocols were in place, including screening questions prior to the visit, additional usage of staff PPE, and extensive cleaning of exam room while observing appropriate contact time as indicated for disinfecting solutions.    HPI Jason [name] presents for f/up - He complains of weight gain.  He tells me he has not been doing any lifestyle modifications.  He thinks his blood sugars are well controlled though he does not monitor them.  He denies polys.  He is active and denies any recent episodes of chest pain, shortness of breath, palpitations, edema, or fatigue.  Outpatient Medications Prior to Visit  Medication Sig Dispense Refill  . aspirin EC 81 MG tablet Take 1 tablet (81 mg total) by mouth daily. 90 tablet 3  . blood glucose meter kit and supplies KIT Use to test blood sugar twice daily. DX: E11.9 1 each 0  . Blood Glucose Monitoring Suppl (ONETOUCH VERIO) w/Device KIT 1 Act by Does not apply route 3 (three) times daily. 1 kit 2  . glucose blood (TRUE METRIX BLOOD GLUCOSE TEST) test strip Use to test blood sugar twice daily. DX: E11.9 200 each 3  . metFORMIN (GLUCOPHAGE) 500 MG tablet Take 2 tablets (1,000 mg total) by mouth 2 (two) times daily with a meal. 360 tablet 1  . Multiple Vitamins-Minerals (MULTIVITAMIN WITH MINERALS) tablet Take 1 tablet by mouth daily.    . simvastatin (ZOCOR) 40 MG tablet Take 1 tablet (40 mg total) by mouth daily at 6 PM. 90 tablet 1  . TRUEplus Lancets 33G MISC Use to test blood sugar twice daily. DX: E11.9 200 each 3  . vitamin C (ASCORBIC ACID) 500 MG tablet Take 1,000 mg by mouth daily.    . Wound Dressings (MEDIHONEY WOUND/BURN DRESSING) GEL Apply to affected are 3 times a week, and cover with sterile dressing. 15 mL 2   No facility-administered medications prior  to visit.    ROS Review of Systems  Constitutional: Positive for unexpected weight change (wt gain). Negative for appetite change, diaphoresis and fatigue.  Eyes: Negative.   Respiratory: Negative.  Negative for cough, chest tightness, shortness of breath and wheezing.   Cardiovascular: Negative for chest pain, palpitations and leg swelling.  Gastrointestinal: Negative for abdominal pain, constipation, diarrhea and vomiting.  Endocrine: Negative.  Negative for cold intolerance, heat intolerance, polydipsia, polyphagia and polyuria.  Genitourinary: Negative for difficulty urinating.  Musculoskeletal: Negative for arthralgias and myalgias.  Skin: Negative.   Neurological: Negative.  Negative for dizziness, weakness and light-headedness.  Hematological: Negative for adenopathy. Does not bruise/bleed easily.  Psychiatric/Behavioral: Negative.     Objective:  BP 138/76 (BP Location: Right Arm, Patient Position: Sitting, Cuff Size: Normal)   Pulse 79   Temp 98.7 F (37.1 C) (Oral)   Resp 16   Ht _0  (2.032 m)   Wt (!) 300 lb 6 oz (136.2 kg)   SpO2 96%   BMI 33.00 kg/m   BP Readings from Last 3 Encounters:  12/31/19 138/76  12/14/19 110/62  09/13/19 (!) 142/48    Wt Readings from Last 3 Encounters:  12/31/19 (!) 300 lb 6 oz (136.2 kg)  12/14/19 297 lb 6.4 oz (134.9 kg)  09/13/19 299 lb (135.6 kg)    Physical Exam Vitals reviewed.  Constitutional:  Appearance: He is obese.  HENT:     Nose: Nose normal.     Mouth/Throat:     Mouth: Mucous membranes are moist.  Eyes:     General: No scleral icterus.    Conjunctiva/sclera: Conjunctivae normal.  Cardiovascular:     Rate and Rhythm: Normal rate.     Heart sounds: Murmur present. Systolic murmur present with a grade of 1/6. No diastolic murmur.  Pulmonary:     Effort: Pulmonary effort is normal.     Breath sounds: No stridor. No wheezing, rhonchi or rales.  Abdominal:     General: Abdomen is protuberant. Bowel  sounds are normal.     Palpations: There is no hepatomegaly, splenomegaly or mass.     Tenderness: There is no abdominal tenderness.  Musculoskeletal:        General: Normal range of motion.     Cervical back: Neck supple.     Right lower leg: No edema.     Left lower leg: No edema.  Lymphadenopathy:     Cervical: No cervical adenopathy.  Skin:    General: Skin is warm and dry.  Neurological:     General: No focal deficit present.     Mental Status: He is alert.  Psychiatric:        Mood and Affect: Mood normal.        Behavior: Behavior normal.     Lab Results  Component Value Date   WBC 5.8 07/19/2019   HGB 13.0 07/19/2019   HCT 39.4 07/19/2019   PLT 167.0 07/19/2019   GLUCOSE 260 (H) 07/19/2019   CHOL 137 04/18/2019   TRIG 80.0 04/18/2019   HDL 52.40 04/18/2019   LDLCALC 68 04/18/2019   ALT 18 05/17/2019   AST 19 05/17/2019   NA 138 07/19/2019   K 4.1 07/19/2019   CL 102 07/19/2019   CREATININE 1.07 07/19/2019   BUN 17 07/19/2019   CO2 27 07/19/2019   TSH 0.85 04/18/2019   PSA 1.13 04/18/2019   INR 1.05 10/17/2012   HGBA1C 6.9 (A) 12/31/2019   MICROALBUR 11.3 (H) 04/18/2019    No results found.  Assessment & Plan:   Artez was seen today for diabetes.  Diagnoses and all orders for this visit:  Diabetes mellitus type 2 with atherosclerosis of arteries of extremities ([place])- His A1c is at 6.9%.  He has achieved adequate glycemic control.  Will continue the current dose of Metformin. -     POCT glycosylated hemoglobin (Hb A1C) -     HM Diabetes Foot Exam  PAD (peripheral artery disease) (HCC) - He has had no recent episodes of claudication.  Pulses are palpable and there is no evidence of ischemia.  Will continue to work on risk factor modifications.   I am having Jason [name] maintain his aspirin EC, OneTouch Verio, vitamin C, Medihoney Wound/Burn Dressing, multivitamin with minerals, simvastatin, True Metrix Blood Glucose Test, TRUEplus Lancets  33G, blood glucose meter kit and supplies, and metFORMIN.  No orders of the defined types were placed in this encounter.    Follow-up: Return in about 6 months (around 06/29/2020).  Scarlette Calico, MD

## 2020-01-04 ENCOUNTER — Telehealth: Payer: Self-pay

## 2020-01-04 DIAGNOSIS — IMO0002 Reserved for concepts with insufficient information to code with codable children: Secondary | ICD-10-CM

## 2020-01-04 DIAGNOSIS — I70209 Unspecified atherosclerosis of native arteries of extremities, unspecified extremity: Secondary | ICD-10-CM

## 2020-01-04 DIAGNOSIS — E1165 Type 2 diabetes mellitus with hyperglycemia: Secondary | ICD-10-CM

## 2020-01-04 DIAGNOSIS — E1151 Type 2 diabetes mellitus with diabetic peripheral angiopathy without gangrene: Secondary | ICD-10-CM

## 2020-01-04 MED ORDER — MEDIHONEY WOUND/BURN DRESSING EX GEL: CUTANEOUS | 2 refills | Status: DC

## 2020-01-04 MED ORDER — ASPIRIN EC 81 MG PO TBEC: 81.0000 mg | DELAYED_RELEASE_TABLET | Freq: Every day | ORAL | 3 refills | Status: DC

## 2020-01-04 MED ORDER — TRUEPLUS LANCETS 33G MISC: 3 refills | Status: DC

## 2020-01-04 MED ORDER — SIMVASTATIN 40 MG PO TABS: 40.0000 mg | ORAL_TABLET | Freq: Every day | ORAL | 1 refills | Status: DC

## 2020-01-04 MED ORDER — TRUE METRIX BLOOD GLUCOSE TEST VI STRP: ORAL_STRIP | 3 refills | Status: DC

## 2020-01-04 MED ORDER — METFORMIN HCL 500 MG PO TABS: 1000.0000 mg | ORAL_TABLET | Freq: Two times a day (BID) | ORAL | 1 refills | Status: DC

## 2020-01-04 NOTE — Telephone Encounter (Signed)
erx sent

## 2020-01-04 NOTE — Telephone Encounter (Signed)
Medication Requested:  aspirin EC 81 MG tablet blood glucose meter kit and supplies KIT Blood Glucose Monitoring Suppl (ONETOUCH VERIO) w/Device KIT glucose blood (TRUE METRIX BLOOD GLUCOSE TEST) test strip metFORMIN (GLUCOPHAGE) 500 MG tablet Multiple Vitamins-Minerals (MULTIVITAMIN WITH MINERALS) tablet simvastatin (ZOCOR) 40 MG tablet TRUEplus Lancets 33G MISC vitamin C (ASCORBIC ACID) 500 MG tablet Wound Dressings (MEDIHONEY WOUND/BURN DRESSING) GEL  Is medication on med list: Yes  (if no, inform pt they may need an appointment)  Is medication a controled: No  (yes = last OV with PCP)  -Controlled Substances: Adderall, Ritalin, oxycodone, hydrocodone, methadone, alprazolam, etc  Last visit with PCP:  12/31/2019   Is the OV > than 4 months: (yes = schedule an appt if one is not already made)  Pharmacy (Name, [place]): [facility]  Order 90 day supply  [phone]

## 2020-01-09 ENCOUNTER — Ambulatory Visit: Payer: HMO

## 2020-01-09 ENCOUNTER — Telehealth: Payer: Self-pay | Admitting: Internal Medicine

## 2020-01-09 DIAGNOSIS — I70209 Unspecified atherosclerosis of native arteries of extremities, unspecified extremity: Secondary | ICD-10-CM

## 2020-01-09 DIAGNOSIS — E1151 Type 2 diabetes mellitus with diabetic peripheral angiopathy without gangrene: Secondary | ICD-10-CM

## 2020-01-09 NOTE — Telephone Encounter (Signed)
New Message:   Jaxon from [facility] is requesting a order for the patient to get a new  Accu Check Testing Kit with everything in it she states. Please advise.

## 2020-01-09 NOTE — Telephone Encounter (Signed)
rx pended. Will have another provider sign in Dr. Ronnald Ramp absence.

## 2020-01-10 MED ORDER — BLOOD GLUCOSE MONITOR KIT: PACK | 0 refills | Status: DC

## 2020-01-10 MED ORDER — COOL BLOOD GLUCOSE TEST STRIPS VI STRP: ORAL_STRIP | 3 refills | Status: DC

## 2020-01-10 MED ORDER — ACCU-CHEK SOFT TOUCH LANCETS MISC: 3 refills | Status: DC

## 2020-01-10 NOTE — Telephone Encounter (Signed)
RX printed signed and faxed.

## 2020-01-11 ENCOUNTER — Telehealth: Payer: Self-pay | Admitting: Internal Medicine

## 2020-01-11 NOTE — Telephone Encounter (Signed)
New message:   Patient is returning a call and states he is not sure what the call is about

## 2020-02-17 NOTE — Progress Notes (Signed)
  Subjective:  Patient ID: Jason [name], male    DOB: September 30, 1941,  MRN: [id number]  Chief Complaint  Patient presents with  . Diabetes    pt is here for routine foot care, pt is a diabetic type 2 as well, Pt is also seeing Liliane Channel for diabetic shoes    79 y.o. male presents with the above complaint. History confirmed with patient.   Objective:  Physical Exam: warm, good capillary refill, nail exam onychomycosis of the toenails, no trophic changes or ulcerative lesions. DP pulses palpable, PT pulses palpable and protective sensation absent Left Foot: normal exam, no swelling, tenderness, instability; ligaments intact, full range of motion of all ankle/foot joints  Right Foot: TMA  noted  No images are attached to the encounter.  Assessment:   1. Onychomycosis of multiple toenails with type 2 diabetes mellitus and peripheral neuropathy (HCC)   2. Callus   3. DM type 2 with diabetic peripheral neuropathy ([place])      Plan:  Patient was evaluated and treated and all questions answered.  S/p TMA Right with small Submet 2 callus -Nails palliatively debrided secondary to pain  Procedure: Nail Debridement Rationale: Patient meets criteria for routine foot care due to amputation hx Type of Debridement: manual, sharp debridement. Instrumentation: Nail nipper, rotary burr. Number of Nails: 5   Procedure: Paring of Lesion Rationale: painful hyperkeratotic lesion Type of Debridement: manual, sharp debridement. Instrumentation: 312 blade Number of Lesions: 1  No follow-ups on file.

## 2020-02-28 ENCOUNTER — Telehealth: Payer: Self-pay

## 2020-02-28 NOTE — Telephone Encounter (Signed)
Canceled appt called pt to r/s lvm 02/28/20

## 2020-02-28 NOTE — Telephone Encounter (Signed)
Patient called and left a message. He needs to cancel his appointment for 02/29/20 @ 10:45. He does not have transportation. Please call to reschedule Thanks

## 2020-02-29 ENCOUNTER — Ambulatory Visit: Payer: Medicare HMO | Admitting: Podiatry

## 2020-04-23 DIAGNOSIS — Z89411 Acquired absence of right great toe: Secondary | ICD-10-CM | POA: Diagnosis not present

## 2020-04-23 DIAGNOSIS — G4733 Obstructive sleep apnea (adult) (pediatric): Secondary | ICD-10-CM | POA: Diagnosis not present

## 2020-04-23 DIAGNOSIS — E669 Obesity, unspecified: Secondary | ICD-10-CM | POA: Diagnosis not present

## 2020-04-23 DIAGNOSIS — E1151 Type 2 diabetes mellitus with diabetic peripheral angiopathy without gangrene: Secondary | ICD-10-CM | POA: Diagnosis not present

## 2020-04-23 DIAGNOSIS — E785 Hyperlipidemia, unspecified: Secondary | ICD-10-CM | POA: Diagnosis not present

## 2020-04-23 DIAGNOSIS — F17210 Nicotine dependence, cigarettes, uncomplicated: Secondary | ICD-10-CM | POA: Diagnosis not present

## 2020-04-23 DIAGNOSIS — Z89421 Acquired absence of other right toe(s): Secondary | ICD-10-CM | POA: Diagnosis not present

## 2020-04-23 DIAGNOSIS — Z8551 Personal history of malignant neoplasm of bladder: Secondary | ICD-10-CM | POA: Diagnosis not present

## 2020-04-23 DIAGNOSIS — Z6833 Body mass index (BMI) 33.0-33.9, adult: Secondary | ICD-10-CM | POA: Diagnosis not present

## 2020-04-25 DIAGNOSIS — E119 Type 2 diabetes mellitus without complications: Secondary | ICD-10-CM | POA: Diagnosis not present

## 2020-04-25 DIAGNOSIS — H25813 Combined forms of age-related cataract, bilateral: Secondary | ICD-10-CM | POA: Diagnosis not present

## 2020-04-30 DIAGNOSIS — S80861A Insect bite (nonvenomous), right lower leg, initial encounter: Secondary | ICD-10-CM | POA: Diagnosis not present

## 2020-04-30 DIAGNOSIS — W64XXXA Exposure to other animate mechanical forces, initial encounter: Secondary | ICD-10-CM | POA: Diagnosis not present

## 2020-05-05 ENCOUNTER — Ambulatory Visit: Payer: Medicare HMO | Admitting: Internal Medicine

## 2020-05-05 DIAGNOSIS — Z0289 Encounter for other administrative examinations: Secondary | ICD-10-CM

## 2020-05-15 ENCOUNTER — Other Ambulatory Visit: Payer: Self-pay | Admitting: Internal Medicine

## 2020-05-15 DIAGNOSIS — IMO0002 Reserved for concepts with insufficient information to code with codable children: Secondary | ICD-10-CM

## 2020-05-15 DIAGNOSIS — I70209 Unspecified atherosclerosis of native arteries of extremities, unspecified extremity: Secondary | ICD-10-CM

## 2020-05-20 DIAGNOSIS — C672 Malignant neoplasm of lateral wall of bladder: Secondary | ICD-10-CM | POA: Diagnosis not present

## 2020-05-20 DIAGNOSIS — N5201 Erectile dysfunction due to arterial insufficiency: Secondary | ICD-10-CM | POA: Diagnosis not present

## 2020-05-21 ENCOUNTER — Ambulatory Visit (INDEPENDENT_AMBULATORY_CARE_PROVIDER_SITE_OTHER): Payer: Medicare PPO | Admitting: Internal Medicine

## 2020-05-21 ENCOUNTER — Other Ambulatory Visit: Payer: Self-pay

## 2020-05-21 ENCOUNTER — Encounter: Payer: Self-pay | Admitting: Internal Medicine

## 2020-05-21 VITALS — BP 128/60 | HR 68 | Temp 98.4°F | Ht >= 80 in | Wt 309.0 lb

## 2020-05-21 DIAGNOSIS — E1165 Type 2 diabetes mellitus with hyperglycemia: Secondary | ICD-10-CM

## 2020-05-21 DIAGNOSIS — E1151 Type 2 diabetes mellitus with diabetic peripheral angiopathy without gangrene: Secondary | ICD-10-CM

## 2020-05-21 DIAGNOSIS — E785 Hyperlipidemia, unspecified: Secondary | ICD-10-CM

## 2020-05-21 DIAGNOSIS — E118 Type 2 diabetes mellitus with unspecified complications: Secondary | ICD-10-CM

## 2020-05-21 DIAGNOSIS — I739 Peripheral vascular disease, unspecified: Secondary | ICD-10-CM

## 2020-05-21 DIAGNOSIS — Z0000 Encounter for general adult medical examination without abnormal findings: Secondary | ICD-10-CM

## 2020-05-21 DIAGNOSIS — IMO0002 Reserved for concepts with insufficient information to code with codable children: Secondary | ICD-10-CM

## 2020-05-21 DIAGNOSIS — I70209 Unspecified atherosclerosis of native arteries of extremities, unspecified extremity: Secondary | ICD-10-CM | POA: Diagnosis not present

## 2020-05-21 MED ORDER — XARELTO 2.5 MG PO TABS: 2.5000 mg | ORAL_TABLET | Freq: Two times a day (BID) | ORAL | 1 refills | Status: DC

## 2020-05-21 MED ORDER — FREESTYLE LIBRE 14 DAY READER DEVI: 1.0000 | Freq: Every day | 5 refills | Status: DC

## 2020-05-21 MED ORDER — FREESTYLE LIBRE 14 DAY SENSOR MISC: 1.0000 | Freq: Every day | 5 refills | Status: DC

## 2020-05-21 NOTE — Progress Notes (Signed)
Subjective:  Patient ID: Jason [name], male    DOB: 1941-07-18  Age: 79 y.o. MRN: [id number]  CC: Annual Exam, Hyperlipidemia, and Diabetes  This visit occurred during the SARS-CoV-2 public health emergency.  Safety protocols were in place, including screening questions prior to the visit, additional usage of staff PPE, and extensive cleaning of exam room while observing appropriate contact time as indicated for disinfecting solutions.    HPI Jason [name] presents for a CPX.  He is concerned about his lower extremities.  He has dark spots over his shins.  He is not very active but when he does walk he does not experience claudication.  He wants to see a vascular surgeon and to know if there is anything else he can do to keep the arteries open.  He denies any recent episodes of chest pain, shortness of breath, palpitations, edema, or fatigue.  He does not monitor his blood sugar.  He tells me he is compliant with Metformin.  Outpatient Medications Prior to Visit  Medication Sig Dispense Refill  . aspirin EC 81 MG tablet Take 1 tablet (81 mg total) by mouth daily. 90 tablet 3  . blood glucose meter kit and supplies KIT Use to check blood sugar daily. DX: E11. 1 each 0  . glucose blood (COOL BLOOD GLUCOSE TEST STRIPS) test strip Use to check blood sugar daily. DX: E11. 100 each 3  . Lancets (ACCU-CHEK SOFT TOUCH) lancets Use to check blood sugar daily. DX: E11. 100 each 3  . metFORMIN (GLUCOPHAGE) 500 MG tablet TAKE 2 TABLETS (1,000 MG TOTAL) BY MOUTH 2 (TWO) TIMES DAILY WITH A MEAL. 360 tablet 0  . Multiple Vitamins-Minerals (MULTIVITAMIN WITH MINERALS) tablet Take 1 tablet by mouth daily.    . simvastatin (ZOCOR) 40 MG tablet TAKE 1 TABLET EVERY DAY  AT  6 PM 90 tablet 0  . vitamin C (ASCORBIC ACID) 500 MG tablet Take 1,000 mg by mouth daily.    . Wound Dressings (MEDIHONEY WOUND/BURN DRESSING) GEL Apply to affected are 3 times a week, and cover with sterile dressing. 15 mL 2    No facility-administered medications prior to visit.    ROS Review of Systems  Constitutional: Positive for unexpected weight change (wt gain). Negative for appetite change, chills, diaphoresis and fatigue.  HENT: Negative.   Eyes: Negative.   Respiratory: Negative.  Negative for cough, chest tightness, shortness of breath and wheezing.   Cardiovascular: Negative for chest pain, palpitations and leg swelling.  Gastrointestinal: Negative for abdominal pain, blood in stool, constipation, diarrhea, nausea and vomiting.  Endocrine: Negative.   Genitourinary: Negative.  Negative for difficulty urinating, dysuria and hematuria.  Musculoskeletal: Negative.  Negative for arthralgias and myalgias.  Skin: Positive for color change. Negative for wound.  Neurological: Negative.  Negative for dizziness, weakness, light-headedness and numbness.  Hematological: Negative for adenopathy. Does not bruise/bleed easily.  Psychiatric/Behavioral: Negative.     Objective:  BP 128/60 (BP Location: Right Arm, Patient Position: Sitting, Cuff Size: Large)   Pulse 68   Temp 98.4 F (36.9 C) (Oral)   Ht '6\' 8"'  (2.032 m)   Wt (!) 309 lb (140.2 kg)   SpO2 97%   BMI 33.95 kg/m   BP Readings from Last 3 Encounters:  05/21/20 128/60  12/31/19 138/76  12/14/19 110/62    Wt Readings from Last 3 Encounters:  05/21/20 (!) 309 lb (140.2 kg)  12/31/19 (!) 300 lb 6 oz (136.2 kg)  12/14/19 297 lb 6.4 oz (134.9 kg)    Physical Exam Vitals reviewed.  Constitutional:      Appearance: Normal appearance. He is not ill-appearing.  HENT:     Nose: Nose normal.     Mouth/Throat:     Mouth: Mucous membranes are moist.  Eyes:     General: No scleral icterus.    Conjunctiva/sclera: Conjunctivae normal.  Cardiovascular:     Rate and Rhythm: Normal rate and regular rhythm.     Heart sounds: Murmur heard.  Systolic murmur is present with a grade of 1/6.  No gallop.   Pulmonary:     Effort: Pulmonary effort is  normal.     Breath sounds: No stridor. No wheezing, rhonchi or rales.  Abdominal:     General: Abdomen is protuberant. Bowel sounds are normal. There is no distension.     Palpations: Abdomen is soft. There is no hepatomegaly, splenomegaly or mass.     Tenderness: There is no abdominal tenderness.     Hernia: No hernia is present.  Musculoskeletal:        General: No swelling or tenderness. Normal range of motion.     Cervical back: Neck supple.     Right lower leg: No edema.     Left lower leg: No edema.  Lymphadenopathy:     Cervical: No cervical adenopathy.  Skin:    General: Skin is warm.     Comments: Over both lower extremities, somewhat symmetrically, there is diffuse postinflammatory pigmentation and brawny induration.  There are no wounds, ulcers, exudates, weeping, warmth, streaking, or erythema.  See photos.  Neurological:     General: No focal deficit present.     Mental Status: He is alert and oriented to person, place, and time. Mental status is at baseline.  Psychiatric:        Mood and Affect: Mood normal.        Behavior: Behavior normal.     Lab Results  Component Value Date   WBC 4.9 05/21/2020   HGB 12.4 (L) 05/21/2020   HCT 37.0 (L) 05/21/2020   PLT 146 05/21/2020   GLUCOSE 154 (H) 05/21/2020   CHOL 147 05/21/2020   TRIG 100 05/21/2020   HDL 49 05/21/2020   LDLCALC 79 05/21/2020   ALT 11 05/21/2020   AST 12 05/21/2020   NA 137 05/21/2020   K 4.5 05/21/2020   CL 101 05/21/2020   CREATININE 1.00 05/21/2020   BUN 13 05/21/2020   CO2 31 05/21/2020   TSH 0.85 04/18/2019   PSA 1.13 04/18/2019   INR 1.05 10/17/2012   HGBA1C 8.1 (H) 05/21/2020   MICROALBUR 2.1 05/21/2020    No results found.  Assessment & Plan:   Jason [name] was seen today for annual exam, hyperlipidemia and diabetes.  Diagnoses and all orders for this visit:  Diabetes mellitus type 2 with atherosclerosis of arteries of extremities ([place])- His A1c is up to 8.1%.  I recommended that  he add a GLP-1 agonist to the Metformin. -     Basic metabolic panel; Future -     Microalbumin / creatinine urine ratio; Future -     Urinalysis, Routine w reflex microscopic; Future -     Hemoglobin A1c; Future -     Continuous Blood Gluc Sensor (FREESTYLE LIBRE 14 DAY SENSOR) MISC; 1 Act by Does not apply route daily. -     Continuous Blood Gluc Receiver (FREESTYLE LIBRE 14 DAY READER) DEVI; 1 Act by Does not  apply route daily. -     HM Diabetes Foot Exam -     Hemoglobin A1c -     Urinalysis, Routine w reflex microscopic -     Microalbumin / creatinine urine ratio -     Basic metabolic panel -     [id number],8.28 or 0.5MG/DOS, (OZEMPIC, 0.25 OR 0.5 MG/DOSE,) 2 MG/1.5ML SOPN; Inject 0.375 mLs (0.5 mg total) into the skin once a week. -     Consult to [place] Management -     Amb Referral to Nutrition and Diabetic E  PAD (peripheral artery disease) ([place])- Based on his symptoms and exam there is no evidence of ischemia.  I recommended that we continue to work on risk factor modifications and to add a microdose of Xarelto to reduce the risk of complications.  At his request he will see vascular surgery. -     CBC with Differential/Platelet; Future -     rivaroxaban (XARELTO) 2.5 MG TABS tablet; Take 1 tablet (2.5 mg total) by mouth 2 (two) times daily. -     Ambulatory referral to Vascular Surgery -     CBC with Differential/Platelet  Diabetes mellitus type 2, uncontrolled, with complications ([place])- His blood sugar is not adequately well controlled.  Will add a GLP-1 agonist. -     CBC with Differential/Platelet; Future -     Basic metabolic panel; Future -     Microalbumin / creatinine urine ratio; Future -     Urinalysis, Routine w reflex microscopic; Future -     Hemoglobin A1c; Future -     Continuous Blood Gluc Sensor (FREESTYLE LIBRE 14 DAY SENSOR) MISC; 1 Act by Does not apply route daily. -     Continuous Blood Gluc Receiver (FREESTYLE LIBRE 14 DAY READER) DEVI; 1 Act by Does  not apply route daily. -     HM Diabetes Foot Exam -     Hemoglobin A1c -     Urinalysis, Routine w reflex microscopic -     Microalbumin / creatinine urine ratio -     Basic metabolic panel -     CBC with Differential/Platelet -     Semaglutide,0.25 or 0.5MG/DOS, (OZEMPIC, 0.25 OR 0.5 MG/DOSE,) 2 MG/1.5ML SOPN; Inject 0.375 mLs (0.5 mg total) into the skin once a week. -     Consult to [place] Management -     Amb Referral to Nutrition and Diabetic E  Routine general medical examination at a health care facility- Exam completed, labs reviewed, vaccines reviewed and updated, no cancer screenings are indicated, patient education material was given.  Hyperlipidemia with target LDL less than 70- He has achieved his LDL goal and is doing well on the statin. -     Lipid panel; Future -     Hepatic function panel; Future -     Hepatic function panel -     Lipid panel  Other orders -     MICROSCOPIC MESSAGE   I am having Erling Conte. Ryland start on Xarelto, [employer] 14 Day Sensor, [employer] 14 Day Reader, and Ozempic (0.25 or 0.5 MG/DOSE). I am also having him maintain his vitamin C, multivitamin with minerals, Medihoney Wound/Burn Dressing, aspirin EC, blood glucose meter kit and supplies, Cool Blood Glucose Test Strips, accu-chek soft touch, metFORMIN, and simvastatin.  Meds ordered this encounter  Medications  . rivaroxaban (XARELTO) 2.5 MG TABS tablet    Sig: Take 1 tablet (2.5 mg total) by mouth 2 (two)  times daily.    Dispense:  180 tablet    Refill:  1  . Continuous Blood Gluc Sensor (FREESTYLE LIBRE 14 DAY SENSOR) MISC    Sig: 1 Act by Does not apply route daily.    Dispense:  2 each    Refill:  5  . Continuous Blood Gluc Receiver (FREESTYLE LIBRE 14 DAY READER) DEVI    Sig: 1 Act by Does not apply route daily.    Dispense:  2 each    Refill:  5  . Semaglutide,0.25 or 0.5MG/DOS, (OZEMPIC, 0.25 OR 0.5 MG/DOSE,) 2 MG/1.5ML SOPN    Sig: Inject 0.375 mLs (0.5 mg  total) into the skin once a week.    Dispense:  3 pen    Refill:  0     Follow-up: Return in about 6 months (around 11/21/2020).  Scarlette Calico, MD

## 2020-05-21 NOTE — Patient Instructions (Signed)

## 2020-05-22 ENCOUNTER — Other Ambulatory Visit: Payer: Self-pay

## 2020-05-22 ENCOUNTER — Encounter: Payer: Self-pay | Admitting: Internal Medicine

## 2020-05-22 LAB — URINALYSIS, ROUTINE W REFLEX MICROSCOPIC
Bacteria, UA: NONE SEEN /HPF
Bilirubin Urine: NEGATIVE
Hgb urine dipstick: NEGATIVE
Hyaline Cast: NONE SEEN /LPF
Ketones, ur: NEGATIVE
Nitrite: NEGATIVE
Protein, ur: NEGATIVE
RBC / HPF: NONE SEEN /HPF (ref 0–2)
Specific Gravity, Urine: 1.02 (ref 1.001–1.03)
pH: <=5 (ref 5.0–8.0)

## 2020-05-22 LAB — HEPATIC FUNCTION PANEL
AG Ratio: 1.8 (calc) (ref 1.0–2.5)
ALT: 11 U/L (ref 9–46)
AST: 12 U/L (ref 10–35)
Albumin: 4.3 g/dL (ref 3.6–5.1)
Alkaline phosphatase (APISO): 43 U/L (ref 35–144)
Bilirubin, Direct: 0.2 mg/dL (ref 0.0–0.2)
Globulin: 2.4 g/dL (calc) (ref 1.9–3.7)
Indirect Bilirubin: 0.4 mg/dL (calc) (ref 0.2–1.2)
Total Bilirubin: 0.6 mg/dL (ref 0.2–1.2)
Total Protein: 6.7 g/dL (ref 6.1–8.1)

## 2020-05-22 LAB — CBC WITH DIFFERENTIAL/PLATELET
Absolute Monocytes: 407 cells/uL (ref 200–950)
Basophils Absolute: 29 cells/uL (ref 0–200)
Basophils Relative: 0.6 %
Eosinophils Absolute: 270 cells/uL (ref 15–500)
Eosinophils Relative: 5.5 %
HCT: 37 % — ABNORMAL LOW (ref 38.5–50.0)
Hemoglobin: 12.4 g/dL — ABNORMAL LOW (ref 13.2–17.1)
Lymphs Abs: 1196 cells/uL (ref 850–3900)
MCH: 31.7 pg (ref 27.0–33.0)
MCHC: 33.5 g/dL (ref 32.0–36.0)
MCV: 94.6 fL (ref 80.0–100.0)
MPV: 12.5 fL (ref 7.5–12.5)
Monocytes Relative: 8.3 %
Neutro Abs: 2999 cells/uL (ref 1500–7800)
Neutrophils Relative %: 61.2 %
Platelets: 146 10*3/uL (ref 140–400)
RBC: 3.91 10*6/uL — ABNORMAL LOW (ref 4.20–5.80)
RDW: 11.2 % (ref 11.0–15.0)
Total Lymphocyte: 24.4 %
WBC: 4.9 10*3/uL (ref 3.8–10.8)

## 2020-05-22 LAB — LIPID PANEL
Cholesterol: 147 mg/dL (ref <200)
HDL: 49 mg/dL (ref >=40)
LDL Cholesterol (Calc): 79 mg/dL (calc)
Non-HDL Cholesterol (Calc): 98 mg/dL (calc) (ref <130)
Total CHOL/HDL Ratio: 3 (calc) (ref <5.0)
Triglycerides: 100 mg/dL (ref <150)

## 2020-05-22 LAB — BASIC METABOLIC PANEL
BUN: 13 mg/dL (ref 7–25)
CO2: 31 mmol/L (ref 20–32)
Calcium: 9.7 mg/dL (ref 8.6–10.3)
Chloride: 101 mmol/L (ref 98–110)
Creat: 1 mg/dL (ref 0.70–1.18)
Glucose, Bld: 154 mg/dL — ABNORMAL HIGH (ref 65–99)
Potassium: 4.5 mmol/L (ref 3.5–5.3)
Sodium: 137 mmol/L (ref 135–146)

## 2020-05-22 LAB — MICROALBUMIN / CREATININE URINE RATIO
Creatinine, Urine: 131 mg/dL (ref 20–320)
Microalb Creat Ratio: 16 mcg/mg creat (ref <30)
Microalb, Ur: 2.1 mg/dL

## 2020-05-22 LAB — HEMOGLOBIN A1C
Hgb A1c MFr Bld: 8.1 % of total Hgb — ABNORMAL HIGH (ref <5.7)
Mean Plasma Glucose: 186 (calc)
eAG (mmol/L): 10.3 (calc)

## 2020-05-22 MED ORDER — OZEMPIC (0.25 OR 0.5 MG/DOSE) 2 MG/1.5ML ~~LOC~~ SOPN: 0.5000 mg | PEN_INJECTOR | SUBCUTANEOUS | 0 refills | Status: DC

## 2020-05-22 NOTE — Patient Outreach (Signed)
[facility]) Care Management  05/22/2020  Jason [name] 10/18/1941 [id number]   Telephone Screen  Referral Date: 05/22/2020 Referral Source: MD Office Referral Reason: "poorly controlled DM" Insurance: [employer]   Outreach attempt # 1  to patient. No answer at present. RN CM left HIPAA compliant voicemail message along with contact info.      Plan: RN CM will make outreach attempt to patient within 3-4 business days. RN CM will send unsuccessful outreach letter to patient.   [name], RN,BSN,CCM [place] Management Telephonic Care Management Coordinator Direct Phone: [phone] Toll Free: [phone] Fax: [phone]

## 2020-05-23 ENCOUNTER — Other Ambulatory Visit: Payer: Self-pay

## 2020-05-23 NOTE — Patient Outreach (Signed)
[facility]) Care Management  05/23/2020  SLAYDEN MENNENGA 08-May-1941 [id number]   Telephone Screen  Referral Date: 05/22/2020 Referral Source: MD Office Referral Reason: "poorly controlled DM" Insurance: [facility]   Outreach attempt # 2 to patient. No answer at both numbers listed for patient.      Plan: RN CM will make outreach attempt to patient within 3-4 business days.   [name], RN,BSN,CCM [place] Management Telephonic Care Management Coordinator Direct Phone: [phone] Toll Free: [phone] Fax: [phone]

## 2020-05-25 NOTE — Progress Notes (Signed)
Subjective:  Patient ID: Jason [name], male    DOB: 1940-12-30,  MRN: [id number]  Chief Complaint  Patient presents with  . Routine Post Op    " my foot is feeling pretty good"     79 y.o. male presents for wound care. Thinks the wound is doing a lot better  Review of Systems: Negative except as noted in the HPI. Denies N/V/F/Ch.  Past Medical History:  Diagnosis Date  . Anxiety   . Asthma    in past, no inhaler now  . Benign neoplasm of colon   . Bone infection ([place])   . Cancer [facility])    bladder cancer  . COPD (chronic obstructive pulmonary disease) ([place])   . Depression   . Diverticulosis of colon (without mention of hemorrhage)   . DJD (degenerative joint disease)   . Early cataract   . Esophageal reflux   . Glaucoma   . Headache   . Hidradenitis   . History of BPH   . History of gynecomastia    Unilateral  . Hyperlipidemia   . Irritable bowel syndrome   . Memory loss   . Mitral stenosis    mild-moderate MS 01/2016  . MVP (mitral valve prolapse)   . Obesity, unspecified   . Pneumonia   . Sleep apnea    does not use  cpap or bipap .. no study  ever  . Type II or unspecified type diabetes mellitus without mention of complication, not stated as uncontrolled   . Unspecified venous (peripheral) insufficiency   . Wears dentures   . Wears glasses   . Wound drainage    RIGHT FOOT X 1 WEEK CHANGES DRESSING Q DAY WITH RED DRAINAGE    Current Outpatient Medications:  .  aspirin EC 81 MG tablet, Take 1 tablet (81 mg total) by mouth daily., Disp: 90 tablet, Rfl: 3 .  blood glucose meter kit and supplies KIT, Use to check blood sugar daily. DX: E11., Disp: 1 each, Rfl: 0 .  Continuous Blood Gluc Receiver (FREESTYLE LIBRE 14 DAY READER) DEVI, 1 Act by Does not apply route daily., Disp: 2 each, Rfl: 5 .  Continuous Blood Gluc Sensor (FREESTYLE LIBRE 14 DAY SENSOR) MISC, 1 Act by Does not apply route daily., Disp: 2 each, Rfl: 5 .  glucose blood (COOL BLOOD GLUCOSE TEST  STRIPS) test strip, Use to check blood sugar daily. DX: E11., Disp: 100 each, Rfl: 3 .  Lancets (ACCU-CHEK SOFT TOUCH) lancets, Use to check blood sugar daily. DX: E11., Disp: 100 each, Rfl: 3 .  metFORMIN (GLUCOPHAGE) 500 MG tablet, TAKE 2 TABLETS (1,000 MG TOTAL) BY MOUTH 2 (TWO) TIMES DAILY WITH A MEAL., Disp: 360 tablet, Rfl: 0 .  Multiple Vitamins-Minerals (MULTIVITAMIN WITH MINERALS) tablet, Take 1 tablet by mouth daily., Disp: , Rfl:  .  rivaroxaban (XARELTO) 2.5 MG TABS tablet, Take 1 tablet (2.5 mg total) by mouth 2 (two) times daily., Disp: 180 tablet, Rfl: 1 .  Semaglutide,0.25 or 0.5MG/DOS, (OZEMPIC, 0.25 OR 0.5 MG/DOSE,) 2 MG/1.5ML SOPN, Inject 0.375 mLs (0.5 mg total) into the skin once a week., Disp: 3 pen, Rfl: 0 .  simvastatin (ZOCOR) 40 MG tablet, TAKE 1 TABLET EVERY DAY  AT  6 PM, Disp: 90 tablet, Rfl: 0 .  vitamin C (ASCORBIC ACID) 500 MG tablet, Take 1,000 mg by mouth daily., Disp: , Rfl:  .  Wound Dressings (MEDIHONEY WOUND/BURN DRESSING) GEL, Apply to affected are 3 times a week, and cover with  sterile dressing., Disp: 15 mL, Rfl: 2  Social History   Tobacco Use  Smoking Status Former Smoker  . Packs/day: 0.10  . Years: 5.00  . Pack years: 0.50  . Types: Cigarettes  . Quit date: 05/10/2015  . Years since quitting: 5.0  Smokeless Tobacco Never Used    Allergies  Allergen Reactions  . Bactrim [Sulfamethoxazole-Trimethoprim] Rash   Objective:   There were no vitals filed for this visit. There is no height or weight on file to calculate BMI. Constitutional Well developed. Well nourished.  Vascular Dorsalis pedis pulses palpable bilaterally. Posterior tibial pulses palpable bilaterally. Capillary refill normal to all digits.  No cyanosis or clubbing noted. Pedal hair growth normal.  Neurologic Normal speech. Oriented to person, place, and time. Protective sensation absent  Dermatologic Wound Location: Right 4th/5th toe area Wound Base:  Granular/Healthy Peri-wound: Normal Exudate: None: wound tissue dry Wound Measurements: -0.5x0.5  Orthopedic: No pain to palpation either foot.   Radiographs: None Assessment:   1. Ulcer of toe of right foot, with necrosis of bone ([place])    Plan:  Patient was evaluated and treated and all questions answered.  Ulcer Right foot -Dressed with medihoney, DSD. -Continue off-loading with surgical shoe. -F/u in 3 weeks.  No follow-ups on file.

## 2020-05-26 ENCOUNTER — Encounter: Payer: Medicare PPO | Attending: Internal Medicine | Admitting: Skilled Nursing Facility1

## 2020-05-26 ENCOUNTER — Telehealth: Payer: Self-pay | Admitting: Internal Medicine

## 2020-05-26 ENCOUNTER — Other Ambulatory Visit: Payer: Self-pay

## 2020-05-26 NOTE — Patient Outreach (Signed)
[facility]) Care Management  05/26/2020  Jason [name] [birth year] [id number]   Telephone Screen  Referral Date:05/22/2020 Referral Source:MD Office Referral Reason:"poorly controlled DM" Insurance:[employer]   Outreach attempt #3 to patient. No answer. RN CM left HIPAA compliant voicemail message along with contact info.      Plan: RN CM will make outreach attempt to patient within 3-4 weeks if no response from letter mailed to patient.    [name], RN,BSN,CCM [place] Management Telephonic Care Management Coordinator Direct Phone: [phone] Toll Free: [phone] Fax: [phone]

## 2020-05-26 NOTE — Telephone Encounter (Signed)
New Message:   Pt's wife is calling and states the meter that was ordered for the pt is too expensive. Pt would like something else to be called in for him. Please advise.

## 2020-05-27 NOTE — Telephone Encounter (Signed)
Left detailed message for pt informing that the meter we sent him home with was to get him started checking his BS daily. Pt was to call back and let us know what meter is covered by his rx insurance. Requested pt to contact insurance and we will send in for the meter that is covered.

## 2020-06-02 NOTE — Telephone Encounter (Signed)
F/u   The patient called back and is aware of CMA messages with details.   The patient voiced he has not contacted his insurance company on what meter is covered, patient voiced he will contact his insurance company will call back

## 2020-06-11 ENCOUNTER — Other Ambulatory Visit: Payer: Self-pay

## 2020-06-11 NOTE — Patient Outreach (Signed)
[facility]) Care Management  06/11/2020  Jason [name] 08/28/1941 [id number]    Telephone Screen  Referral Date:05/22/2020 Referral Source:MD Office Referral Reason:"poorly controlled DM" Insurance:[employer]     Multiple attempts to establish contact with patient without success. No response from letter mailed to patient. Case is being closed at this time.       Plan: RN CM will close case at this time.   [name], RN,BSN,CCM [place] Management Telephonic Care Management Coordinator Direct Phone: [phone] Toll Free: [phone] Fax: [phone]

## 2020-06-13 ENCOUNTER — Ambulatory Visit: Payer: Self-pay

## 2020-06-23 ENCOUNTER — Other Ambulatory Visit: Payer: Self-pay

## 2020-06-23 DIAGNOSIS — I739 Peripheral vascular disease, unspecified: Secondary | ICD-10-CM

## 2020-06-30 ENCOUNTER — Other Ambulatory Visit: Payer: Self-pay

## 2020-06-30 ENCOUNTER — Encounter: Payer: Self-pay | Admitting: Internal Medicine

## 2020-06-30 ENCOUNTER — Ambulatory Visit (INDEPENDENT_AMBULATORY_CARE_PROVIDER_SITE_OTHER): Payer: Medicare PPO | Admitting: Internal Medicine

## 2020-06-30 VITALS — BP 140/68 | HR 70 | Temp 98.3°F | Ht >= 80 in | Wt 303.0 lb

## 2020-06-30 DIAGNOSIS — E519 Thiamine deficiency, unspecified: Secondary | ICD-10-CM

## 2020-06-30 DIAGNOSIS — D539 Nutritional anemia, unspecified: Secondary | ICD-10-CM

## 2020-06-30 NOTE — Patient Instructions (Signed)
Anemia  Anemia is a condition in which you do not have enough red blood cells or hemoglobin. Hemoglobin is a substance in red blood cells that carries oxygen. When you do not have enough red blood cells or hemoglobin (are anemic), your body cannot get enough oxygen and your organs may not work properly. As a result, you may feel very tired or have other problems. What are the causes? Common causes of anemia include:  Excessive bleeding. Anemia can be caused by excessive bleeding inside or outside the body, including bleeding from the intestine or from periods in women.  Poor nutrition.  Long-lasting (chronic) kidney, thyroid, and liver disease.  Bone marrow disorders.  Cancer and treatments for cancer.  HIV (human immunodeficiency virus) and AIDS (acquired immunodeficiency syndrome).  Treatments for HIV and AIDS.  Spleen problems.  Blood disorders.  Infections, medicines, and autoimmune disorders that destroy red blood cells. What are the signs or symptoms? Symptoms of this condition include:  Minor weakness.  Dizziness.  Headache.  Feeling heartbeats that are irregular or faster than normal (palpitations).  Shortness of breath, especially with exercise.  Paleness.  Cold sensitivity.  Indigestion.  Nausea.  Difficulty sleeping.  Difficulty concentrating. Symptoms may occur suddenly or develop slowly. If your anemia is mild, you may not have symptoms. How is this diagnosed? This condition is diagnosed based on:  Blood tests.  Your medical history.  A physical exam.  Bone marrow biopsy. Your health care provider may also check your stool (feces) for blood and may do additional testing to look for the cause of your bleeding. You may also have other tests, including:  Imaging tests, such as a CT scan or MRI.  Endoscopy.  Colonoscopy. How is this treated? Treatment for this condition depends on the cause. If you continue to lose a lot of blood, you may  need to be treated at a hospital. Treatment may include:  Taking supplements of iron, vitamin S31, or folic acid.  Taking a hormone medicine (erythropoietin) that can help to stimulate red blood cell growth.  Having a blood transfusion. This may be needed if you lose a lot of blood.  Making changes to your diet.  Having surgery to remove your spleen. Follow these instructions at home:  Take over-the-counter and prescription medicines only as told by your health care provider.  Take supplements only as told by your health care provider.  Follow any diet instructions that you were given.  Keep all follow-up visits as told by your health care provider. This is important. Contact a health care provider if:  You develop new bleeding anywhere in the body. Get help right away if:  You are very weak.  You are short of breath.  You have pain in your abdomen or chest.  You are dizzy or feel faint.  You have trouble concentrating.  You have bloody or black, tarry stools.  You vomit repeatedly or you vomit up blood. Summary  Anemia is a condition in which you do not have enough red blood cells or enough of a substance in your red blood cells that carries oxygen (hemoglobin).  Symptoms may occur suddenly or develop slowly.  If your anemia is mild, you may not have symptoms.  This condition is diagnosed with blood tests as well as a medical history and physical exam. Other tests may be needed.  Treatment for this condition depends on the cause of the anemia. This information is not intended to replace advice given to you by  your health care provider. Make sure you discuss any questions you have with your health care provider. Document Revised: 10/14/2017 Document Reviewed: 12/03/2016 Elsevier Patient Education  [place].

## 2020-06-30 NOTE — Progress Notes (Signed)
Subjective:  Patient ID: Jason [name], male    DOB: 26-Aug-1941  Age: 79 y.o. MRN: [id number]  CC: Anemia  This visit occurred during the SARS-CoV-2 public health emergency.  Safety protocols were in place, including screening questions prior to the visit, additional usage of staff PPE, and extensive cleaning of exam room while observing appropriate contact time as indicated for disinfecting solutions.    HPI Jason [name] presents for f/up on anemia - He denies chest pain, shortness of breath, fatigue, sources of blood loss, or paresthesias.  Outpatient Medications Prior to Visit  Medication Sig Dispense Refill  . aspirin EC 81 MG tablet Take 1 tablet (81 mg total) by mouth daily. 90 tablet 3  . blood glucose meter kit and supplies KIT Use to check blood sugar daily. DX: E11. 1 each 0  . Continuous Blood Gluc Receiver (FREESTYLE LIBRE 14 DAY READER) DEVI 1 Act by Does not apply route daily. 2 each 5  . Continuous Blood Gluc Sensor (FREESTYLE LIBRE 14 DAY SENSOR) MISC 1 Act by Does not apply route daily. 2 each 5  . glucose blood (COOL BLOOD GLUCOSE TEST STRIPS) test strip Use to check blood sugar daily. DX: E11. 100 each 3  . Lancets (ACCU-CHEK SOFT TOUCH) lancets Use to check blood sugar daily. DX: E11. 100 each 3  . metFORMIN (GLUCOPHAGE) 500 MG tablet TAKE 2 TABLETS (1,000 MG TOTAL) BY MOUTH 2 (TWO) TIMES DAILY WITH A MEAL. 360 tablet 0  . Multiple Vitamins-Minerals (MULTIVITAMIN WITH MINERALS) tablet Take 1 tablet by mouth daily.    . rivaroxaban (XARELTO) 2.5 MG TABS tablet Take 1 tablet (2.5 mg total) by mouth 2 (two) times daily. 180 tablet 1  . Semaglutide,0.25 or 0.5MG/DOS, (OZEMPIC, 0.25 OR 0.5 MG/DOSE,) 2 MG/1.5ML SOPN Inject 0.375 mLs (0.5 mg total) into the skin once a week. 3 pen 0  . simvastatin (ZOCOR) 40 MG tablet TAKE 1 TABLET EVERY DAY  AT  6 PM 90 tablet 0  . vitamin C (ASCORBIC ACID) 500 MG tablet Take 1,000 mg by mouth daily.    . Wound Dressings (MEDIHONEY  WOUND/BURN DRESSING) GEL Apply to affected are 3 times a week, and cover with sterile dressing. 15 mL 2   No facility-administered medications prior to visit.    ROS Review of Systems  Constitutional: Positive for unexpected weight change (wt gain). Negative for chills, diaphoresis and fatigue.  HENT: Negative.  Negative for trouble swallowing.   Eyes: Negative for visual disturbance.  Respiratory: Negative for cough, chest tightness, shortness of breath and wheezing.   Cardiovascular: Negative for chest pain, palpitations and leg swelling.  Gastrointestinal: Negative for abdominal pain, blood in stool, constipation, diarrhea, nausea and vomiting.  Genitourinary: Negative.   Musculoskeletal: Negative.  Negative for arthralgias and myalgias.  Skin: Negative.  Negative for pallor.  Neurological: Negative.  Negative for dizziness, weakness, light-headedness and headaches.  Hematological: Negative for adenopathy. Does not bruise/bleed easily.  Psychiatric/Behavioral: Negative.     Objective:  BP 140/68 (BP Location: Left Arm, Patient Position: Sitting, Cuff Size: Large)   Pulse 70   Temp 98.3 F (36.8 C) (Oral)   Ht _0  (2.032 m)   Wt (!) 303 lb (137.4 kg)   SpO2 96%   BMI 33.29 kg/m   BP Readings from Last 3 Encounters:  06/30/20 140/68  05/21/20 128/60  12/31/19 138/76    Wt Readings from Last 3 Encounters:  06/30/20 (!) 303 lb (137.4 kg)  05/21/20 (!) 309  lb (140.2 kg)  12/31/19 (!) 300 lb 6 oz (136.2 kg)    Physical Exam Vitals reviewed.  HENT:     Nose: Nose normal.     Mouth/Throat:     Mouth: Mucous membranes are moist.  Eyes:     General: No scleral icterus.    Conjunctiva/sclera: Conjunctivae normal.  Cardiovascular:     Rate and Rhythm: Normal rate and regular rhythm.     Heart sounds: No murmur heard.   Pulmonary:     Effort: Pulmonary effort is normal.     Breath sounds: No stridor. No wheezing, rhonchi or rales.  Abdominal:     General: Abdomen  is protuberant. Bowel sounds are normal. There is no distension.     Palpations: Abdomen is soft. There is no hepatomegaly, splenomegaly or mass.  Musculoskeletal:        General: Normal range of motion.     Cervical back: Neck supple.  Lymphadenopathy:     Cervical: No cervical adenopathy.  Skin:    General: Skin is warm.     Coloration: Skin is not pale.  Neurological:     General: No focal deficit present.     Mental Status: He is alert.  Psychiatric:        Mood and Affect: Mood normal.        Behavior: Behavior normal.     Lab Results  Component Value Date   WBC 4.6 06/30/2020   HGB 12.6 (L) 06/30/2020   HCT 38.3 (L) 06/30/2020   PLT 159 06/30/2020   GLUCOSE 154 (H) 05/21/2020   CHOL 147 05/21/2020   TRIG 100 05/21/2020   HDL 49 05/21/2020   LDLCALC 79 05/21/2020   ALT 11 05/21/2020   AST 12 05/21/2020   NA 137 05/21/2020   K 4.5 05/21/2020   CL 101 05/21/2020   CREATININE 1.00 05/21/2020   BUN 13 05/21/2020   CO2 31 05/21/2020   TSH 0.85 04/18/2019   PSA 1.13 04/18/2019   INR 1.05 10/17/2012   HGBA1C 8.1 (H) 05/21/2020   MICROALBUR 2.1 05/21/2020    No results found.  Assessment & Plan:   Jason [name] was seen today for anemia.  Diagnoses and all orders for this visit:  Deficiency anemia- His anemia is slightly improved.  His thiamine level is undetectable.  The other vitamin levels are all normal. -     CBC with Differential/Platelet; Future -     Iron; Future -     Vitamin B12; Future -     Reticulocytes; Future -     Folate; Future -     Ferritin; Future -     Vitamin B1; Future -     Zinc; Future -     Zinc -     Vitamin B1 -     Ferritin -     Folate -     Reticulocytes -     CBC with Differential/Platelet -     Vitamin B12 -     Iron  Thiamine deficiency -     thiamine (VITAMIN B-1) 50 MG tablet; Take 1 tablet (50 mg total) by mouth daily.   I am having Jason [name]. Jason [name] start on thiamine. I am also having him maintain his vitamin C,  multivitamin with minerals, Medihoney Wound/Burn Dressing, aspirin EC, blood glucose meter kit and supplies, Cool Blood Glucose Test Strips, accu-chek soft touch, metFORMIN, simvastatin, Xarelto, FreeStyle Libre 14 Day Sensor, [employer] 14 Day Reader, and Ozempic (0.25  or 0.5 MG/DOSE).  Meds ordered this encounter  Medications  . thiamine (VITAMIN B-1) 50 MG tablet    Sig: Take 1 tablet (50 mg total) by mouth daily.    Dispense:  90 tablet    Refill:  1     Follow-up: Return in about 3 months (around 09/30/2020).  Scarlette Calico, MD

## 2020-07-04 ENCOUNTER — Encounter: Payer: Self-pay | Admitting: Internal Medicine

## 2020-07-04 DIAGNOSIS — E519 Thiamine deficiency, unspecified: Secondary | ICD-10-CM | POA: Insufficient documentation

## 2020-07-04 LAB — CBC WITH DIFFERENTIAL/PLATELET
Absolute Monocytes: 428 cells/uL (ref 200–950)
Basophils Absolute: 41 cells/uL (ref 0–200)
Basophils Relative: 0.9 %
Eosinophils Absolute: 359 cells/uL (ref 15–500)
Eosinophils Relative: 7.8 %
HCT: 38.3 % — ABNORMAL LOW (ref 38.5–50.0)
Hemoglobin: 12.6 g/dL — ABNORMAL LOW (ref 13.2–17.1)
Lymphs Abs: 1095 cells/uL (ref 850–3900)
MCH: 31 pg (ref 27.0–33.0)
MCHC: 32.9 g/dL (ref 32.0–36.0)
MCV: 94.3 fL (ref 80.0–100.0)
MPV: 12.2 fL (ref 7.5–12.5)
Monocytes Relative: 9.3 %
Neutro Abs: 2677 cells/uL (ref 1500–7800)
Neutrophils Relative %: 58.2 %
Platelets: 159 10*3/uL (ref 140–400)
RBC: 4.06 10*6/uL — ABNORMAL LOW (ref 4.20–5.80)
RDW: 11.3 % (ref 11.0–15.0)
Total Lymphocyte: 23.8 %
WBC: 4.6 10*3/uL (ref 3.8–10.8)

## 2020-07-04 LAB — ZINC: Zinc: 91 ug/dL (ref 60–130)

## 2020-07-04 LAB — VITAMIN B1: Vitamin B1 (Thiamine): <6 nmol/L — ABNORMAL LOW (ref 8–30)

## 2020-07-04 LAB — IRON: Iron: 78 ug/dL (ref 50–180)

## 2020-07-04 LAB — RETICULOCYTES
ABS Retic: 77140 cells/uL (ref 25000–9000)
Retic Ct Pct: 1.9 %

## 2020-07-04 LAB — FOLATE: Folate: 13.4 ng/mL

## 2020-07-04 LAB — FERRITIN: Ferritin: 61 ng/mL (ref 24–380)

## 2020-07-04 LAB — VITAMIN B12: Vitamin B-12: 411 pg/mL (ref 200–1100)

## 2020-07-04 MED ORDER — VITAMIN B-1 50 MG PO TABS: 50.0000 mg | ORAL_TABLET | Freq: Every day | ORAL | 1 refills | Status: DC

## 2020-07-08 ENCOUNTER — Ambulatory Visit (INDEPENDENT_AMBULATORY_CARE_PROVIDER_SITE_OTHER): Payer: Medicare PPO | Admitting: Vascular Surgery

## 2020-07-08 ENCOUNTER — Encounter: Payer: Self-pay | Admitting: Vascular Surgery

## 2020-07-08 ENCOUNTER — Ambulatory Visit (HOSPITAL_COMMUNITY)
Admission: RE | Admit: 2020-07-08 | Discharge: 2020-07-08 | Disposition: A | Discharge status: Home / Self Care | Payer: Medicare PPO | Source: Ambulatory Visit | Attending: Vascular Surgery | Admitting: Vascular Surgery

## 2020-07-08 ENCOUNTER — Other Ambulatory Visit: Payer: Self-pay

## 2020-07-08 VITALS — BP 117/75 | HR 74 | Temp 97.5°F | Resp 20 | Ht >= 80 in | Wt 293.9 lb

## 2020-07-08 DIAGNOSIS — I739 Peripheral vascular disease, unspecified: Secondary | ICD-10-CM | POA: Insufficient documentation

## 2020-07-08 DIAGNOSIS — I87303 Chronic venous hypertension (idiopathic) without complications of bilateral lower extremity: Secondary | ICD-10-CM

## 2020-07-08 NOTE — Progress Notes (Signed)
Vascular and Vein Specialist of [place]  Patient name: Jason [name] MRN: [id number] DOB: 08-24-1941 Sex: male  REASON FOR CONSULT: Here today for evaluation of arterial and venous pathology in his lower extremities  HPI: Jason [name] is a 79 y.o. male, who is here today for evaluation.  He has prior history of right transmetatarsal amputation.  He initially had a great toe amputation in 2013 due to great toe infection.  Subsequently had a second toe amputation and then third toe and finally transmetatarsal amputation over the next several years stating up to 2020.  He has had complete healing with this.  He does have significant venous hypertension with progressive class IV changes with hemosiderin and thickening bilaterally.  He has no history of venous ulcers.  He has no history of claudication  Past Medical History:  Diagnosis Date  . Anxiety   . Asthma    in past, no inhaler now  . Benign neoplasm of colon   . Bone infection ([place])   . Cancer [facility])    bladder cancer  . COPD (chronic obstructive pulmonary disease) ([place])   . Depression   . Diverticulosis of colon (without mention of hemorrhage)   . DJD (degenerative joint disease)   . Tyja Gortney cataract   . Esophageal reflux   . Glaucoma   . Headache   . Hidradenitis   . History of BPH   . History of gynecomastia    Unilateral  . Hyperlipidemia   . Irritable bowel syndrome   . Memory loss   . Mitral stenosis    mild-moderate MS 01/2016  . MVP (mitral valve prolapse)   . Obesity, unspecified   . Pneumonia   . Sleep apnea    does not use  cpap or bipap .. no study  ever  . Type II or unspecified type diabetes mellitus without mention of complication, not stated as uncontrolled   . Unspecified venous (peripheral) insufficiency   . Wears dentures   . Wears glasses   . Wound drainage    RIGHT FOOT X 1 WEEK CHANGES DRESSING Q DAY WITH RED DRAINAGE    Family History  Problem Relation  Age of Onset  . Diabetes Father   . Cancer Father        Prostate  . Stroke Father   . Prostate cancer Father   . High blood pressure Other   . Colon cancer Neg Hx   . Esophageal cancer Neg Hx   . Rectal cancer Neg Hx   . Stomach cancer Neg Hx     SOCIAL HISTORY: Social History   Socioeconomic History  . Marital status: Married    Spouse name: Peter Congo  . Number of children: 2  . Years of education: Not on file  . Highest education level: Not on file  Occupational History  . Occupation: retired  Tobacco Use  . Smoking status: Former Smoker    Packs/day: 0.10    Years: 5.00    Pack years: 0.50    Types: Cigarettes    Quit date: 05/10/2015    Years since quitting: 5.1  . Smokeless tobacco: Never Used  Vaping Use  . Vaping Use: Never used  Substance and Sexual Activity  . Alcohol use: Not Currently    Alcohol/week: 7.0 standard drinks    Types: 7 Cans of beer per week    Comment: social can of beer NONE RECENTLY  . Drug use: No  . Sexual activity: Not Currently  Other Topics Concern  . Not on file  Social History Narrative  . Not on file   Social Determinants of Health   Financial Resource Strain:   . Difficulty of Paying Living Expenses: Not on file  Food Insecurity: No Food Insecurity  . Worried About Charity fundraiser in the Last Year: Never true  . Ran Out of Food in the Last Year: Never true  Transportation Needs: No Transportation Needs  . Lack of Transportation (Medical): No  . Lack of Transportation (Non-Medical): No  Physical Activity:   . Days of Exercise per Week: Not on file  . Minutes of Exercise per Session: Not on file  Stress:   . Feeling of Stress : Not on file  Social Connections:   . Frequency of Communication with Friends and Family: Not on file  . Frequency of Social Gatherings with Friends and Family: Not on file  . Attends Religious Services: Not on file  . Active Member of Clubs or Organizations: Not on file  . Attends Theatre manager Meetings: Not on file  . Marital Status: Not on file  Intimate Partner Violence:   . Fear of Current or Ex-Partner: Not on file  . Emotionally Abused: Not on file  . Physically Abused: Not on file  . Sexually Abused: Not on file    Allergies  Allergen Reactions  . Bactrim [Sulfamethoxazole-Trimethoprim] Rash    Current Outpatient Medications  Medication Sig Dispense Refill  . aspirin EC 81 MG tablet Take 1 tablet (81 mg total) by mouth daily. 90 tablet 3  . blood glucose meter kit and supplies KIT Use to check blood sugar daily. DX: E11. 1 each 0  . Continuous Blood Gluc Receiver (FREESTYLE LIBRE 14 DAY READER) DEVI 1 Act by Does not apply route daily. 2 each 5  . Continuous Blood Gluc Sensor (FREESTYLE LIBRE 14 DAY SENSOR) MISC 1 Act by Does not apply route daily. 2 each 5  . glucose blood (COOL BLOOD GLUCOSE TEST STRIPS) test strip Use to check blood sugar daily. DX: E11. 100 each 3  . Lancets (ACCU-CHEK SOFT TOUCH) lancets Use to check blood sugar daily. DX: E11. 100 each 3  . metFORMIN (GLUCOPHAGE) 500 MG tablet TAKE 2 TABLETS (1,000 MG TOTAL) BY MOUTH 2 (TWO) TIMES DAILY WITH A MEAL. 360 tablet 0  . Multiple Vitamins-Minerals (MULTIVITAMIN WITH MINERALS) tablet Take 1 tablet by mouth daily.    . rivaroxaban (XARELTO) 2.5 MG TABS tablet Take 1 tablet (2.5 mg total) by mouth 2 (two) times daily. 180 tablet 1  . Semaglutide,0.25 or 0.5MG/DOS, (OZEMPIC, 0.25 OR 0.5 MG/DOSE,) 2 MG/1.5ML SOPN Inject 0.375 mLs (0.5 mg total) into the skin once a week. 3 pen 0  . simvastatin (ZOCOR) 40 MG tablet TAKE 1 TABLET EVERY DAY  AT  6 PM 90 tablet 0  . thiamine (VITAMIN B-1) 50 MG tablet Take 1 tablet (50 mg total) by mouth daily. 90 tablet 1  . vitamin C (ASCORBIC ACID) 500 MG tablet Take 1,000 mg by mouth daily.    . Wound Dressings (MEDIHONEY WOUND/BURN DRESSING) GEL Apply to affected are 3 times a week, and cover with sterile dressing. 15 mL 2   No current facility-administered  medications for this visit.    REVIEW OF SYSTEMS:  '[X]'  denotes positive finding, '[ ]'  denotes negative finding Cardiac  Comments:  Chest pain or chest pressure:    Shortness of breath upon exertion:    Short of breath when  lying flat:    Irregular heart rhythm:        Vascular    Pain in calf, thigh, or hip brought on by ambulation:    Pain in feet at night that wakes you up from your sleep:     Blood clot in your veins:    Leg swelling:         Pulmonary    Oxygen at home:    Productive cough:     Wheezing:         Neurologic    Sudden weakness in arms or legs:     Sudden numbness in arms or legs:     Sudden onset of difficulty speaking or slurred speech:    Temporary loss of vision in one eye:     Problems with dizziness:         Gastrointestinal    Blood in stool:     Vomited blood:         Genitourinary    Burning when urinating:     Blood in urine:        Psychiatric    Major depression:         Hematologic    Bleeding problems:    Problems with blood clotting too easily:        Skin    Rashes or ulcers:        Constitutional    Fever or chills:      PHYSICAL EXAM: Vitals:   07/08/20 1555  BP: 117/75  Pulse: 74  Resp: 20  Temp: (!) 97.5 F (36.4 C)  SpO2: 95%  Weight: 293 lb 14.4 oz (133.3 kg)  Height: '6\' 8"'  (2.032 m)    GENERAL: The patient is a well-nourished male, in no acute distress. The vital signs are documented above. CARDIOVASCULAR: 2+ radial pulses bilaterally.  2+ posterior tibial pulses bilaterally.  I do not palpate right dorsalis pedis pulse.  2+ left dorsalis pedis pulse PULMONARY: There is good air exchange  ABDOMEN: Soft and non-tender  MUSCULOSKELETAL: There are no major deformities or cyanosis.  Well-healed right transmetatarsal amputation NEUROLOGIC: No focal weakness or paresthesias are detected. SKIN: There are no ulcers or rashes noted. PSYCHIATRIC: The patient has a normal affect.  DATA:  Noninvasive studies today  reveal completely normal ankle arm index bilaterally and normal triphasic waveforms bilaterally  MEDICAL ISSUES: Normal arterial studies.  He does have significant venous hypertension by physical exam.  Explained the importance of knee-high compression.  He has worn these in the past resume this.  I explained that this will lessen his chance for venous ulceration and progressive development in the future.  He will see [place] again on an as-needed basis   Rosetta Posner, MD [facility] Vascular and Vein Specialists of [facility] Tel [phone] Pager [phone]

## 2020-07-09 ENCOUNTER — Telehealth: Payer: Self-pay

## 2020-07-09 DIAGNOSIS — E519 Thiamine deficiency, unspecified: Secondary | ICD-10-CM

## 2020-07-09 MED ORDER — VITAMIN B-1 50 MG PO TABS: 50.0000 mg | ORAL_TABLET | Freq: Every day | ORAL | 1 refills | Status: DC

## 2020-07-09 NOTE — Telephone Encounter (Signed)
erx has been sent as requested.  

## 2020-07-09 NOTE — Telephone Encounter (Signed)
1.Medication Requested:thiamine (VITAMIN B-1) 50 MG tablet  2. Pharmacy (Name, Street, [place]):[facility] [phone] - [place] - [address] AT [place]  3. On Med List: Yes   4. Last Visit with PCP: 8.16.2021   5. Next visit date with PCP: 11.16.2021    Agent: Please be advised that RX refills may take up to 3 business days. We ask that you follow-up with your pharmacy.

## 2020-07-11 NOTE — Telephone Encounter (Signed)
   Patient states pharmacy does not have order for Thiamine. Please re-semd

## 2020-07-11 NOTE — Telephone Encounter (Signed)
Sometimes the pharmacy will not pull OTC vitamin and mineral supplements. Pt will need to purchase it off of the counter.   LVM for pt informing of same. Also informed pt that the OTC bottle will either say Thiamine or B-1 and he is to take 50 mg daily.

## 2020-07-14 ENCOUNTER — Encounter: Payer: Medicare PPO | Attending: Internal Medicine | Admitting: Dietician

## 2020-07-22 ENCOUNTER — Other Ambulatory Visit (HOSPITAL_COMMUNITY): Payer: Medicare HMO

## 2020-07-23 ENCOUNTER — Ambulatory Visit (HOSPITAL_COMMUNITY): Payer: Medicare PPO | Attending: Cardiology

## 2020-07-23 ENCOUNTER — Other Ambulatory Visit: Payer: Self-pay

## 2020-07-23 DIAGNOSIS — I059 Rheumatic mitral valve disease, unspecified: Secondary | ICD-10-CM | POA: Diagnosis not present

## 2020-07-23 LAB — ECHOCARDIOGRAM COMPLETE
Area-P 1/2: 2.11 cm2
MV M vel: 4.8 m/s
MV Peak grad: 92.2 mmHg
S' Lateral: 2.5 cm

## 2020-08-18 ENCOUNTER — Other Ambulatory Visit: Payer: Self-pay | Admitting: Internal Medicine

## 2020-08-18 DIAGNOSIS — IMO0002 Reserved for concepts with insufficient information to code with codable children: Secondary | ICD-10-CM

## 2020-08-18 DIAGNOSIS — E1151 Type 2 diabetes mellitus with diabetic peripheral angiopathy without gangrene: Secondary | ICD-10-CM

## 2020-08-19 ENCOUNTER — Other Ambulatory Visit: Payer: Self-pay | Admitting: Internal Medicine

## 2020-08-27 ENCOUNTER — Telehealth: Payer: Self-pay | Admitting: Internal Medicine

## 2020-08-27 NOTE — Telephone Encounter (Signed)
    1.Medication Requested: glimepride  2. Pharmacy (Name, Street, [place]): [facility]  3. On Med List: No  4. Last Visit with PCP: 06/30/20  5. Next visit date with PCP: 09/30/20   Agent: Please be advised that RX refills may take up to 3 business days. We ask that you follow-up with your pharmacy.

## 2020-08-27 NOTE — Telephone Encounter (Signed)
Called pt, LVM stating that glimepiride was d/c and replaced with ozempic.

## 2020-09-30 ENCOUNTER — Telehealth: Payer: Self-pay | Admitting: Internal Medicine

## 2020-09-30 ENCOUNTER — Ambulatory Visit (INDEPENDENT_AMBULATORY_CARE_PROVIDER_SITE_OTHER): Payer: Medicare PPO | Admitting: Internal Medicine

## 2020-09-30 ENCOUNTER — Other Ambulatory Visit: Payer: Self-pay

## 2020-09-30 ENCOUNTER — Encounter: Payer: Self-pay | Admitting: Internal Medicine

## 2020-09-30 VITALS — BP 122/74 | HR 65 | Temp 97.8°F | Ht >= 80 in | Wt 304.0 lb

## 2020-09-30 DIAGNOSIS — I70209 Unspecified atherosclerosis of native arteries of extremities, unspecified extremity: Secondary | ICD-10-CM

## 2020-09-30 DIAGNOSIS — E1165 Type 2 diabetes mellitus with hyperglycemia: Secondary | ICD-10-CM | POA: Diagnosis not present

## 2020-09-30 DIAGNOSIS — E1151 Type 2 diabetes mellitus with diabetic peripheral angiopathy without gangrene: Secondary | ICD-10-CM

## 2020-09-30 DIAGNOSIS — I700 Atherosclerosis of aorta: Secondary | ICD-10-CM

## 2020-09-30 DIAGNOSIS — E519 Thiamine deficiency, unspecified: Secondary | ICD-10-CM | POA: Diagnosis not present

## 2020-09-30 DIAGNOSIS — R0600 Dyspnea, unspecified: Secondary | ICD-10-CM

## 2020-09-30 DIAGNOSIS — E118 Type 2 diabetes mellitus with unspecified complications: Secondary | ICD-10-CM

## 2020-09-30 DIAGNOSIS — IMO0002 Reserved for concepts with insufficient information to code with codable children: Secondary | ICD-10-CM

## 2020-09-30 DIAGNOSIS — Z23 Encounter for immunization: Secondary | ICD-10-CM

## 2020-09-30 DIAGNOSIS — R9431 Abnormal electrocardiogram [ECG] [EKG]: Secondary | ICD-10-CM

## 2020-09-30 DIAGNOSIS — R0609 Other forms of dyspnea: Secondary | ICD-10-CM | POA: Insufficient documentation

## 2020-09-30 LAB — CBC WITH DIFFERENTIAL/PLATELET
Basophils Absolute: 0 10*3/uL (ref 0.0–0.1)
Basophils Relative: 1.2 % (ref 0.0–3.0)
Eosinophils Absolute: 0.2 10*3/uL (ref 0.0–0.7)
Eosinophils Relative: 6.1 % — ABNORMAL HIGH (ref 0.0–5.0)
HCT: 39.1 % (ref 39.0–52.0)
Hemoglobin: 13.1 g/dL (ref 13.0–17.0)
Lymphocytes Relative: 20.4 % (ref 12.0–46.0)
Lymphs Abs: 0.8 10*3/uL (ref 0.7–4.0)
MCHC: 33.5 g/dL (ref 30.0–36.0)
MCV: 93.3 fl (ref 78.0–100.0)
Monocytes Absolute: 0.4 10*3/uL (ref 0.1–1.0)
Monocytes Relative: 9.3 % (ref 3.0–12.0)
Neutro Abs: 2.6 10*3/uL (ref 1.4–7.7)
Neutrophils Relative %: 63 % (ref 43.0–77.0)
Platelets: 165 10*3/uL (ref 150.0–400.0)
RBC: 4.2 Mil/uL — ABNORMAL LOW (ref 4.22–5.81)
RDW: 12.6 % (ref 11.5–15.5)
WBC: 4.1 10*3/uL (ref 4.0–10.5)

## 2020-09-30 LAB — BASIC METABOLIC PANEL
BUN: 14 mg/dL (ref 6–23)
CO2: 28 mEq/L (ref 19–32)
Calcium: 9.3 mg/dL (ref 8.4–10.5)
Chloride: 100 mEq/L (ref 96–112)
Creatinine, Ser: 1.11 mg/dL (ref 0.40–1.50)
GFR: 63.59 mL/min (ref >60.00)
Glucose, Bld: 370 mg/dL — ABNORMAL HIGH (ref 70–99)
Potassium: 4.4 mEq/L (ref 3.5–5.1)
Sodium: 135 mEq/L (ref 135–145)

## 2020-09-30 LAB — HEMOGLOBIN A1C: Hgb A1c MFr Bld: 9 % — ABNORMAL HIGH (ref 4.6–6.5)

## 2020-09-30 LAB — BRAIN NATRIURETIC PEPTIDE: Pro B Natriuretic peptide (BNP): 117 pg/mL — ABNORMAL HIGH (ref 0.0–100.0)

## 2020-09-30 LAB — TROPONIN I (HIGH SENSITIVITY): High Sens Troponin I: 12 ng/L (ref 2–17)

## 2020-09-30 MED ORDER — GVOKE HYPOPEN 2-PACK 1 MG/0.2ML ~~LOC~~ SOAJ: 1.0000 | Freq: Every day | SUBCUTANEOUS | 5 refills | Status: DC | PRN

## 2020-09-30 MED ORDER — INSULIN PEN NEEDLE 32G X 6 MM MISC: 1.0000 | Freq: Two times a day (BID) | 3 refills | Status: DC

## 2020-09-30 MED ORDER — TOUJEO MAX SOLOSTAR 300 UNIT/ML ~~LOC~~ SOPN: 30.0000 [IU] | PEN_INJECTOR | Freq: Every day | SUBCUTANEOUS | 1 refills | Status: DC

## 2020-09-30 MED ORDER — OZEMPIC (0.25 OR 0.5 MG/DOSE) 2 MG/1.5ML ~~LOC~~ SOPN: 0.5000 mg | PEN_INJECTOR | SUBCUTANEOUS | 1 refills | Status: DC

## 2020-09-30 NOTE — Telephone Encounter (Signed)
    Patient calling to report he had Pfizer vaccine 1/19 and 01/03/20 Booster 08/28/20

## 2020-09-30 NOTE — Progress Notes (Signed)
Subjective:  Patient ID: Jason [name], male    DOB: 23-May-1941  Age: 79 y.o. MRN: [id number]  CC: Anemia  This visit occurred during the SARS-CoV-2 public health emergency.  Safety protocols were in place, including screening questions prior to the visit, additional usage of staff PPE, and extensive cleaning of exam room while observing appropriate contact time as indicated for disinfecting solutions.    HPI Jason [name] presents for f/up - Hhe complains of a 1 month history of chest pain, DOE and shortness of breath.  He associates this with getting some vaccines.  He describes the chest pain as a throbbing sensation.  He also complains of fatigue.  He denies hemoptysis, cough, diaphoresis, dizziness, lightheadedness, palpitations, or worsening edema.  Outpatient Medications Prior to Visit  Medication Sig Dispense Refill  . aspirin EC 81 MG tablet Take 1 tablet (81 mg total) by mouth daily. 90 tablet 3  . blood glucose meter kit and supplies KIT Use to check blood sugar daily. DX: E11. 1 each 0  . Continuous Blood Gluc Receiver (FREESTYLE LIBRE 14 DAY READER) DEVI 1 Act by Does not apply route daily. 2 each 5  . Continuous Blood Gluc Sensor (FREESTYLE LIBRE 14 DAY SENSOR) MISC 1 Act by Does not apply route daily. 2 each 5  . glucose blood (COOL BLOOD GLUCOSE TEST STRIPS) test strip Use to check blood sugar daily. DX: E11. 100 each 3  . Lancets (ACCU-CHEK SOFT TOUCH) lancets Use to check blood sugar daily. DX: E11. 100 each 3  . metFORMIN (GLUCOPHAGE) 500 MG tablet TAKE 2 TABLETS (1,000 MG TOTAL) BY MOUTH 2 (TWO) TIMES DAILY WITH A MEAL. 360 tablet 0  . Multiple Vitamins-Minerals (MULTIVITAMIN WITH MINERALS) tablet Take 1 tablet by mouth daily.    . rivaroxaban (XARELTO) 2.5 MG TABS tablet Take 1 tablet (2.5 mg total) by mouth 2 (two) times daily. 180 tablet 1  . simvastatin (ZOCOR) 40 MG tablet TAKE 1 TABLET EVERY DAY  AT  6 PM 90 tablet 1  . thiamine (VITAMIN B-1) 50 MG tablet Take 1  tablet (50 mg total) by mouth daily. 90 tablet 1  . vitamin C (ASCORBIC ACID) 500 MG tablet Take 1,000 mg by mouth daily.    . Wound Dressings (MEDIHONEY WOUND/BURN DRESSING) GEL Apply to affected are 3 times a week, and cover with sterile dressing. 15 mL 2  . Semaglutide,0.25 or 0.5MG/DOS, (OZEMPIC, 0.25 OR 0.5 MG/DOSE,) 2 MG/1.5ML SOPN Inject 0.375 mLs (0.5 mg total) into the skin once a week. 3 pen 0   No facility-administered medications prior to visit.    ROS Review of Systems  Constitutional: Positive for fatigue and unexpected weight change (wt gain). Negative for appetite change, chills, diaphoresis and fever.  HENT: Negative.  Negative for sore throat and trouble swallowing.   Eyes: Negative.   Respiratory: Positive for shortness of breath. Negative for cough, chest tightness, wheezing and stridor.   Cardiovascular: Positive for chest pain. Negative for palpitations and leg swelling.  Gastrointestinal: Negative for abdominal pain, constipation, diarrhea, nausea and vomiting.  Endocrine: Negative.   Genitourinary: Negative.  Negative for difficulty urinating, dysuria and hematuria.  Musculoskeletal: Negative.  Negative for arthralgias and myalgias.  Skin: Negative.  Negative for pallor.  Neurological: Negative.  Negative for dizziness, weakness, light-headedness, numbness and headaches.  Hematological: Negative for adenopathy. Does not bruise/bleed easily.  Psychiatric/Behavioral: Negative.     Objective:  BP 122/74   Pulse 65   Temp 97.8 F (  36.6 C) (Oral)   Ht '6\' 8"'  (2.032 m)   Wt (!) 304 lb (137.9 kg)   SpO2 95%   BMI 33.40 kg/m   BP Readings from Last 3 Encounters:  09/30/20 122/74  07/08/20 117/75  06/30/20 140/68    Wt Readings from Last 3 Encounters:  09/30/20 (!) 304 lb (137.9 kg)  07/08/20 293 lb 14.4 oz (133.3 kg)  06/30/20 (!) 303 lb (137.4 kg)    Physical Exam Vitals reviewed.  Constitutional:      General: He is not in acute distress.     Appearance: He is not ill-appearing or toxic-appearing.  HENT:     Nose: Nose normal.     Mouth/Throat:     Mouth: Mucous membranes are moist.  Eyes:     General: No scleral icterus.    Conjunctiva/sclera: Conjunctivae normal.  Cardiovascular:     Rate and Rhythm: Normal rate and regular rhythm.     Heart sounds: Murmur heard.  Systolic murmur is present with a grade of 3/6.  No diastolic murmur is present.  No gallop.      Comments: EKG -  NSR w 1st degree AV block TWI in lateral leads is new NO LVH or Q waves Pulmonary:     Effort: Pulmonary effort is normal.     Breath sounds: No stridor. No wheezing, rhonchi or rales.  Abdominal:     General: Abdomen is flat.     Palpations: There is no mass.     Tenderness: There is no abdominal tenderness. There is no guarding.  Musculoskeletal:        General: No swelling. Normal range of motion.     Cervical back: Neck supple.     Right lower leg: Edema (trace) present.     Left lower leg: Edema (trace) present.  Neurological:     General: No focal deficit present.     Mental Status: He is alert.  Psychiatric:        Mood and Affect: Mood normal.        Behavior: Behavior normal.     Lab Results  Component Value Date   WBC 4.1 09/30/2020   HGB 13.1 09/30/2020   HCT 39.1 09/30/2020   PLT 165.0 09/30/2020   GLUCOSE 370 (H) 09/30/2020   CHOL 147 05/21/2020   TRIG 100 05/21/2020   HDL 49 05/21/2020   LDLCALC 79 05/21/2020   ALT 11 05/21/2020   AST 12 05/21/2020   NA 135 09/30/2020   K 4.4 09/30/2020   CL 100 09/30/2020   CREATININE 1.11 09/30/2020   BUN 14 09/30/2020   CO2 28 09/30/2020   TSH 0.85 04/18/2019   PSA 1.13 04/18/2019   INR 1.05 10/17/2012   HGBA1C 9.0 (H) 09/30/2020   MICROALBUR 2.1 05/21/2020    VAS [place] ABI WITH/WO TBI  Result Date: 07/08/2020 LOWER EXTREMITY DOPPLER STUDY Indications: Peripheral artery disease, and right great toe amputation. High Risk Factors: Hyperlipidemia, Diabetes.  Comparison  Study: 05/01/19 Performing Technologist: June Leap RDMS, RVT  Examination Guidelines: A complete evaluation includes at minimum, Doppler waveform signals and systolic blood pressure reading at the level of bilateral brachial, anterior tibial, and posterior tibial arteries, when vessel segments are accessible. Bilateral testing is considered an integral part of a complete examination. Photoelectric Plethysmograph (PPG) waveforms and toe systolic pressure readings are included as required and additional duplex testing as needed. Limited examinations for reoccurring indications may be performed as noted.  ABI Findings: +--------+------------------+-----+---------+--------+ Right  Rt Pressure (mmHg)IndexWaveform Comment  +--------+------------------+-----+---------+--------+ [id number]                                      +--------+------------------+-----+---------+--------+ ATA     168               1.24 triphasic         +--------+------------------+-----+---------+--------+ PTA     123               0.90 triphasic         +--------+------------------+-----+---------+--------+ +---------+------------------+-----+---------+-------+ Left     Lt Pressure (mmHg)IndexWaveform Comment +---------+------------------+-----+---------+-------+ ATA      177               1.30 triphasic        +---------+------------------+-----+---------+-------+ PTA      163               1.20 triphasic        +---------+------------------+-----+---------+-------+ Great Toe145               1.07 Normal           +---------+------------------+-----+---------+-------+ +-------+-----------+-----------+------------+------------+ ABI/TBIToday's ABIToday's TBIPrevious ABIPrevious TBI +-------+-----------+-----------+------------+------------+ Right  1.24                  1.2                      +-------+-----------+-----------+------------+------------+ Left   1.3                    [place]                       +-------+-----------+-----------+------------+------------+ Arterial wall calcification precludes accurate ankle pressures and ABIs.  Summary: Right: Resting right ankle-brachial index is within normal range. No evidence of significant right lower extremity arterial disease. Left: Resting left ankle-brachial index is within normal range. No evidence of significant left lower extremity arterial disease.  *See table(s) above for measurements and observations.  Electronically signed by Curt Jews MD on 07/08/2020 at 3:57:28 PM.   Final     Assessment & Plan:   Fredie was seen today for anemia.  Diagnoses and all orders for this visit:  Atherosclerosis of aorta ([place])- Will continue to address risk factor modifications.  Flu vaccine need -     Flu Vaccine QUAD High Dose(Fluad)  Thiamine deficiency- His H&H are normal.  I will monitor his thiamine level. -     CBC with Differential/Platelet; Future -     Vitamin B1; Future -     Vitamin B1 -     CBC with Differential/Platelet  Diabetes mellitus type 2 with atherosclerosis of arteries of extremities ([place])- See below. -     Basic metabolic panel; Future -     Hemoglobin A1c; Future -     Hemoglobin A1c -     Basic metabolic panel -     [id number],2.72 or 0.5MG/DOS, (OZEMPIC, 0.25 OR 0.5 MG/DOSE,) 2 MG/1.5ML SOPN; Inject 0.5 mg into the skin once a week. -     Consult to [place] Management -     Amb Referral to Nutrition and Diabetic E -     insulin glargine, 2 Unit Dial, (TOUJEO MAX SOLOSTAR) 300 UNIT/ML Solostar Pen; Inject 30 Units into the skin daily. -     Glucagon (GVOKE HYPOPEN 2-PACK) 1 MG/0.2ML SOAJ;  Inject 1 Act into the skin daily as needed. -     Insulin Pen Needle 32G X 6 MM MISC; 1 Act by Does not apply route in the morning and at bedtime.  DOE (dyspnea on exertion)- He has atypical chest pain and DOE.  His EKG is concerning for new area of T wave inversion.  His BNP is mildly elevated but his  D-dimer and troponin are normal.  I recommended that he undergo a myocardial perfusion imaging to screen for ischemia. -     EKG 12-Lead -     Brain natriuretic peptide; Future -     D-dimer, quantitative (not at [facility]); Future -     Troponin I (High Sensitivity); Future -     Troponin I (High Sensitivity) -     D-dimer, quantitative (not at [facility]) -     Brain natriuretic peptide -     MYOCARDIAL PERFUSION IMAGING; Future  Abnormal electrocardiogram (ECG) (EKG)- See above. -     Brain natriuretic peptide; Future -     D-dimer, quantitative (not at [facility]); Future -     Troponin I (High Sensitivity); Future -     Troponin I (High Sensitivity) -     D-dimer, quantitative (not at [facility]) -     Brain natriuretic peptide -     MYOCARDIAL PERFUSION IMAGING; Future  Abnormal electrocardiogram -     MYOCARDIAL PERFUSION IMAGING; Future  Diabetes mellitus type 2, uncontrolled, with complications ([place])- His A1c is up to 9.0%.  It looks like he is no longer using the GLP-1 agonist.  I have asked him to restart the GLP-1 agonist and to start a basal insulin.  Will continue the current dose of Metformin. -     Semaglutide,0.25 or 0.5MG/DOS, (OZEMPIC, 0.25 OR 0.5 MG/DOSE,) 2 MG/1.5ML SOPN; Inject 0.5 mg into the skin once a week. -     Consult to [place] Management -     Amb Referral to Nutrition and Diabetic E -     insulin glargine, 2 Unit Dial, (TOUJEO MAX SOLOSTAR) 300 UNIT/ML Solostar Pen; Inject 30 Units into the skin daily. -     Glucagon (GVOKE HYPOPEN 2-PACK) 1 MG/0.2ML SOAJ; Inject 1 Act into the skin daily as needed. -     Insulin Pen Needle 32G X 6 MM MISC; 1 Act by Does not apply route in the morning and at bedtime.   I have changed Erling Conte. Engh's Ozempic (0.25 or 0.5 MG/DOSE). I am also having him start on Toujeo Max SoloStar, Gvoke HypoPen 2-Pack, and Insulin Pen Needle. Additionally, I am having him maintain his vitamin C, multivitamin with minerals, Medihoney Wound/Burn Dressing,  aspirin EC, blood glucose meter kit and supplies, Cool Blood Glucose Test Strips, accu-chek soft touch, Xarelto, FreeStyle Libre 14 Day Sensor, [employer] 14 Day Reader, thiamine, simvastatin, and metFORMIN.  Meds ordered this encounter  Medications  . Semaglutide,0.25 or 0.5MG/DOS, (OZEMPIC, 0.25 OR 0.5 MG/DOSE,) 2 MG/1.5ML SOPN    Sig: Inject 0.5 mg into the skin once a week.    Dispense:  9 mL    Refill:  1  . insulin glargine, 2 Unit Dial, (TOUJEO MAX SOLOSTAR) 300 UNIT/ML Solostar Pen    Sig: Inject 30 Units into the skin daily.    Dispense:  9 mL    Refill:  1  . Glucagon (GVOKE HYPOPEN 2-PACK) 1 MG/0.2ML SOAJ    Sig: Inject 1 Act into the skin daily as needed.  Dispense:  2 mL    Refill:  5  . Insulin Pen Needle 32G X 6 MM MISC    Sig: 1 Act by Does not apply route in the morning and at bedtime.    Dispense:  100 each    Refill:  3   I spent 50 minutes in preparing to see the patient by review of recent labs, imaging and procedures, obtaining and reviewing separately obtained history, communicating with the patient and family or caregiver, ordering medications, tests or procedures, and documenting clinical information in the EHR including the differential Dx, treatment, and any further evaluation and other management of 1. Atherosclerosis of aorta ([place]) 2. Thiamine deficiency 3. Diabetes mellitus type 2 with atherosclerosis of arteries of extremities ([place]) 4. DOE (dyspnea on exertion) 5. Abnormal electrocardiogram (ECG) (EKG) 6. Abnormal electrocardiogram 7. Diabetes mellitus type 2, uncontrolled, with complications ([place])     Follow-up: Return in about 6 weeks (around 11/11/2020).  Scarlette Calico, MD

## 2020-09-30 NOTE — Telephone Encounter (Signed)
Immunization record has been updated

## 2020-09-30 NOTE — Patient Instructions (Signed)
Anemia  Anemia is a condition in which you do not have enough red blood cells or hemoglobin. Hemoglobin is a substance in red blood cells that carries oxygen. When you do not have enough red blood cells or hemoglobin (are anemic), your body cannot get enough oxygen and your organs may not work properly. As a result, you may feel very tired or have other problems. What are the causes? Common causes of anemia include:  Excessive bleeding. Anemia can be caused by excessive bleeding inside or outside the body, including bleeding from the intestine or from periods in women.  Poor nutrition.  Long-lasting (chronic) kidney, thyroid, and liver disease.  Bone marrow disorders.  Cancer and treatments for cancer.  HIV (human immunodeficiency virus) and AIDS (acquired immunodeficiency syndrome).  Treatments for HIV and AIDS.  Spleen problems.  Blood disorders.  Infections, medicines, and autoimmune disorders that destroy red blood cells. What are the signs or symptoms? Symptoms of this condition include:  Minor weakness.  Dizziness.  Headache.  Feeling heartbeats that are irregular or faster than normal (palpitations).  Shortness of breath, especially with exercise.  Paleness.  Cold sensitivity.  Indigestion.  Nausea.  Difficulty sleeping.  Difficulty concentrating. Symptoms may occur suddenly or develop slowly. If your anemia is mild, you may not have symptoms. How is this diagnosed? This condition is diagnosed based on:  Blood tests.  Your medical history.  A physical exam.  Bone marrow biopsy. Your health care provider may also check your stool (feces) for blood and may do additional testing to look for the cause of your bleeding. You may also have other tests, including:  Imaging tests, such as a CT scan or MRI.  Endoscopy.  Colonoscopy. How is this treated? Treatment for this condition depends on the cause. If you continue to lose a lot of blood, you may  need to be treated at a hospital. Treatment may include:  Taking supplements of iron, vitamin S31, or folic acid.  Taking a hormone medicine (erythropoietin) that can help to stimulate red blood cell growth.  Having a blood transfusion. This may be needed if you lose a lot of blood.  Making changes to your diet.  Having surgery to remove your spleen. Follow these instructions at home:  Take over-the-counter and prescription medicines only as told by your health care provider.  Take supplements only as told by your health care provider.  Follow any diet instructions that you were given.  Keep all follow-up visits as told by your health care provider. This is important. Contact a health care provider if:  You develop new bleeding anywhere in the body. Get help right away if:  You are very weak.  You are short of breath.  You have pain in your abdomen or chest.  You are dizzy or feel faint.  You have trouble concentrating.  You have bloody or black, tarry stools.  You vomit repeatedly or you vomit up blood. Summary  Anemia is a condition in which you do not have enough red blood cells or enough of a substance in your red blood cells that carries oxygen (hemoglobin).  Symptoms may occur suddenly or develop slowly.  If your anemia is mild, you may not have symptoms.  This condition is diagnosed with blood tests as well as a medical history and physical exam. Other tests may be needed.  Treatment for this condition depends on the cause of the anemia. This information is not intended to replace advice given to you by  your health care provider. Make sure you discuss any questions you have with your health care provider. Document Revised: 10/14/2017 Document Reviewed: 12/03/2016 Elsevier Patient Education  [place].

## 2020-10-01 ENCOUNTER — Other Ambulatory Visit: Payer: Self-pay

## 2020-10-01 NOTE — Patient Outreach (Signed)
[facility]) Care Management  10/01/2020  RAJ LANDRESS [birth year] [id number]   Telephone Screen  Referral Date: 10/01/2020 Referral Source: MD Office Referral Reason: "poorly controlled DM" Insurance: [employer]   Outreach attempt #1 to patient. No answer. RN CM left HIPAA compliant voicemail message along with contact info.     Plan: RN CM will make outreach attempt to patient within 3-4 business days. RN CM will send unsuccessful outreach letter to patient.   [name], RN,BSN,CCM [place] Management Telephonic Care Management Coordinator Direct Phone: [phone] Toll Free: [phone] Fax: [phone]

## 2020-10-02 ENCOUNTER — Other Ambulatory Visit: Payer: Self-pay

## 2020-10-02 NOTE — Patient Outreach (Signed)
[facility]) Care Management  10/02/2020  Jason [name] Oct 12, 1941 [id number]   Telephone Screen  Referral Date: 10/01/2020 Referral Source: MD Office Referral Reason: "poorly controlled DM" Insurance: [employer] Medicare    Unsuccessful outreach attempt #2 to patient.     Plan: RN CM will make outreach attempt to patient within 3-4 business days.  [name], RN,BSN,CCM [place] Management Telephonic Care Management Coordinator Direct Phone: [phone] Toll Free: [phone] Fax: [phone]

## 2020-10-04 LAB — VITAMIN B1: Vitamin B1 (Thiamine): 65 nmol/L — ABNORMAL HIGH (ref 8–30)

## 2020-10-04 LAB — D-DIMER, QUANTITATIVE: D-Dimer, Quant: 0.36 mcg/mL FEU (ref <0.50)

## 2020-10-06 ENCOUNTER — Encounter (HOSPITAL_COMMUNITY): Payer: Self-pay | Admitting: Internal Medicine

## 2020-10-07 ENCOUNTER — Other Ambulatory Visit: Payer: Self-pay

## 2020-10-07 NOTE — Patient Outreach (Signed)
[facility]) Care Management  10/07/2020  Jason [name] 02-14-1941 [id number]   Telephone Screen  Referral Date:10/01/2020 Referral Source:MD Office Referral Reason:"poorly controlled DM" Insurance:[employer] Medicare    Outreach attempt # 3 to patient. Call went straight to voicemail.   Plan: RN CM will make outreach attempt to patient within 3-4 wks if no response from letter mailed to patient.  Jason Montgomery, RN,BSN,CCM [place] Management Telephonic Care Management Coordinator Direct Phone: [phone] Toll Free: [phone] Fax: [phone]

## 2020-10-16 ENCOUNTER — Telehealth (HOSPITAL_COMMUNITY): Payer: Self-pay

## 2020-10-16 NOTE — Telephone Encounter (Signed)
Tailed instructions left on the patient's answering machine. Asked to call back with any questions. S.Jshon Ibe EMTP

## 2020-10-20 ENCOUNTER — Encounter: Payer: Self-pay | Admitting: Registered"

## 2020-10-20 ENCOUNTER — Other Ambulatory Visit: Payer: Self-pay

## 2020-10-20 ENCOUNTER — Encounter: Payer: Medicare PPO | Attending: Internal Medicine | Admitting: Registered"

## 2020-10-20 DIAGNOSIS — E118 Type 2 diabetes mellitus with unspecified complications: Secondary | ICD-10-CM | POA: Insufficient documentation

## 2020-10-20 DIAGNOSIS — IMO0002 Reserved for concepts with insufficient information to code with codable children: Secondary | ICD-10-CM

## 2020-10-20 DIAGNOSIS — E1165 Type 2 diabetes mellitus with hyperglycemia: Secondary | ICD-10-CM | POA: Diagnosis not present

## 2020-10-20 NOTE — Progress Notes (Signed)
Diabetes Self-Management Education  Visit Type: First/Initial  Appt. Start Time: 0915 Appt. End Time: 1030  10/20/2020  [name], identified by name and date of birth, is a 79 y.o. male with a diagnosis of Diabetes: Type 2.   This patient is accompanied in the office by his spouse.  ASSESSMENT  There were no vitals taken for this visit. There is no height or weight on file to calculate BMI.   Patient states his main concern today is learning how to use his insulin Rx.  Patient states his prescriptions need to be sent to [facility] if possible. Pt reports he didn't pick up one of the Rx at [facility] because couldn't afford $400 for Rx.   Patient c/o sleep apnea, difficulty walking, and some depression because of reduced ability to take care of himself and having to rely on others. Pt states he has talked to a counselor one time but didn't return for follow-up appointment. RD provided list of therapists.  Pt states he relies on his wife to help with understanding his medical care. Pt's wife states that she is not always available to him because she has some serious health issues of her own.  CBG: Checks 2x/day; fasting and nighttime; often goes up to 200s. Patient states ~4 yrs ago almost passed out at store due to low blood sugar, but no symptoms since then.  Complications: Pt reports toes amputated on R foot.  Pt states he will return for nutrition part of education in 2 weeks.    Diabetes Self-Management Education - 10/20/20 0937      Visit Information   Visit Type First/Initial      Initial Visit   Diabetes Type Type 2    Are you currently following a meal plan? No    Are you taking your medications as prescribed? No   needs insulin instruction   Date Diagnosed long time ago in [place]   How would you rate your overall health? Good      Psychosocial Assessment   Patient Belief/Attitude about Diabetes --   eats like he doesn't  have it   Other persons present Spouse/SO    How often do you need to have someone help you when you read instructions, pamphlets, or other written materials from your doctor or pharmacy? 5 - Always    What is the last grade level you completed in school? BS      Complications   Last HgB A1C per patient/outside source 9 %   11/16   How often do you check your blood sugar? 1-2 times/day    Fasting Blood glucose range (mg/dL) 130-179;>200   138-258   Have you had a dilated eye exam in the past 12 months? No    Have you had a dental exam in the past 12 months? No    Are you checking your feet? Yes    How many days per week are you checking your feet? 7      Dietary Intake   Breakfast cereal, milk OR sandwhich    Lunch sandwich OR gravy biscuit OR salad    Dinner mac n cheese OR chicken sandwich OR banana, clam chowder    Snack (evening) ice cream    Beverage(s) coffee, 12 oz orange juice 4-5 times week,      Exercise   Exercise Type ADL's      Patient Education   Previous Diabetes Education Yes (  please comment)   he doesn't remember getting education   Medications Taught/reviewed insulin injection, site rotation, insulin storage and needle disposal.    Acute complications Taught treatment of hypoglycemia - the 15 rule.      Individualized Goals (developed by patient)   Medications take my medication as prescribed    Reducing Risk treat hypoglycemia with 15 grams of carbs if blood glucose less than 63m/dL      Outcomes   Expected Outcomes Demonstrated interest in learning. Expect positive outcomes    Future DMSE 2 wks    Program Status Not Completed           Individualized Plan for Diabetes Self-Management Training:   Learning Objective:  Patient will have a greater understanding of diabetes self-management. Patient education plan is to attend individual and/or group sessions per assessed needs and concerns.  Patient Instructions  Begin using your long acting insulin pen  as directed and discard used needles as instructed in today's visit   Expected Outcomes:  Demonstrated interest in learning. Expect positive outcomes  Education material provided: Quick reference guide to insulin pens, Picture guide where to inject insulin, Types of insulin  If problems or questions, patient to contact team via:  Phone  Future DSME appointment: 2 wks

## 2020-10-20 NOTE — Patient Instructions (Addendum)
Begin using your long acting insulin pen as directed and discard used needles as instructed in today's visit.

## 2020-10-21 ENCOUNTER — Other Ambulatory Visit: Payer: Self-pay

## 2020-10-21 ENCOUNTER — Encounter (HOSPITAL_COMMUNITY): Payer: Self-pay | Admitting: *Deleted

## 2020-10-21 ENCOUNTER — Ambulatory Visit (HOSPITAL_COMMUNITY): Payer: Medicare PPO | Attending: Cardiology

## 2020-10-21 DIAGNOSIS — R9431 Abnormal electrocardiogram [ECG] [EKG]: Secondary | ICD-10-CM | POA: Diagnosis not present

## 2020-10-21 DIAGNOSIS — R0609 Other forms of dyspnea: Secondary | ICD-10-CM

## 2020-10-21 DIAGNOSIS — R0600 Dyspnea, unspecified: Secondary | ICD-10-CM

## 2020-10-21 MED ORDER — TECHNETIUM TC 99M TETROFOSMIN IV KIT
32.1000 | PACK | Freq: Once | INTRAVENOUS | Status: AC | PRN
  Administered 2020-10-21: 32.1 via INTRAVENOUS
  Filled 2020-10-21: qty 33

## 2020-10-21 NOTE — Patient Outreach (Signed)
Jason [name] [facility]) Care Management  10/21/2020  Jason [name] [birth year] [id number]   Telephone Screen  Referral Date:10/01/2020 Referral Source:MD Office Referral Reason:"poorly controlled DM" Insurance:[employer] Medicare   Multiple attempts to establish contact with patient without success. No response from letter mailed to patient. Case is being closed at this time.      Plan: RN CM will close case at this time.   [name], RN,BSN,CCM [place] Management Telephonic Care Management Coordinator Direct Phone: [phone] Toll Free: [phone] Fax: [phone]

## 2020-10-22 ENCOUNTER — Ambulatory Visit (HOSPITAL_COMMUNITY): Payer: Medicare PPO | Attending: Cardiology

## 2020-10-22 ENCOUNTER — Ambulatory Visit: Payer: Self-pay

## 2020-10-22 DIAGNOSIS — R9431 Abnormal electrocardiogram [ECG] [EKG]: Secondary | ICD-10-CM | POA: Diagnosis not present

## 2020-10-22 DIAGNOSIS — R0600 Dyspnea, unspecified: Secondary | ICD-10-CM | POA: Diagnosis not present

## 2020-10-22 MED ORDER — TECHNETIUM TC 99M TETROFOSMIN IV KIT
32.8000 | PACK | Freq: Once | INTRAVENOUS | Status: AC | PRN
  Administered 2020-10-22: 32.8 via INTRAVENOUS
  Filled 2020-10-22: qty 33

## 2020-10-22 MED ORDER — REGADENOSON 0.4 MG/5ML IV SOLN
0.4000 mg | Freq: Once | INTRAVENOUS | Status: AC
  Administered 2020-10-22: 0.4 mg via INTRAVENOUS

## 2020-10-23 LAB — MYOCARDIAL PERFUSION IMAGING
LV dias vol: 145 mL (ref 62–150)
LV sys vol: 105 mL
Peak HR: 85 {beats}/min
Rest HR: 73 {beats}/min
SDS: 2
SRS: 0
SSS: 2
TID: 1.03

## 2020-10-28 ENCOUNTER — Telehealth: Payer: Self-pay | Admitting: Internal Medicine

## 2020-10-28 NOTE — Telephone Encounter (Signed)
Patient called and was wondering if someone could call him back and explain to him how to use Semaglutide,0.25 or 0.5MG /DOS, (OZEMPIC, 0.25 OR 0.5 MG/DOSE,) 2 MG/1.5ML SOPN He was also wondering if there is something else that is cheaper that could be called in. It can be sent to [place], [place] [address]

## 2020-11-03 ENCOUNTER — Encounter: Payer: Self-pay | Admitting: Registered"

## 2020-11-03 ENCOUNTER — Encounter: Payer: Medicare PPO | Admitting: Registered"

## 2020-11-03 ENCOUNTER — Other Ambulatory Visit: Payer: Self-pay

## 2020-11-03 NOTE — Progress Notes (Signed)
Ozempic Pen Instruction  Patient arrived for appointment with Ozempic prescription and states he wants to learn how to use it.    MD orders are: Semaglutide: Inject 0.5 mg into the skin once a week  The following learning objectives were met by the patient during this visit:   Hygiene and storage  Dial flow check and press dose button  Dial up 0.5 mg  Rotation of Sites (reviewed from last visit)  Hypoglycemia- symptoms, causes, treatment choices (discussed last visit)  Needle disposal  Patient demonstrated understanding by self-injecting first weekly dose of Ozempic. RD wrote on package today's day for first dose. Patient demonstrated understading by verbally repeating next dose will be in 1 week.  Patient received the following handouts:  none                                        Patient to start on insulin as Rx'd by MD  Patient will be seen for follow-up in 1 weeks for DSME to focus on dietary choices.

## 2020-11-03 NOTE — Telephone Encounter (Signed)
Called pt, LVM.   

## 2020-11-17 ENCOUNTER — Telehealth: Payer: Self-pay | Admitting: Internal Medicine

## 2020-11-17 NOTE — Telephone Encounter (Signed)
Copied from CRM [id number]. Topic: Medicare AWV >> Nov 17, 2020 12:13 PM Claudette Laws R wrote: Reason for CRM:   Left message for patient to call back and schedule Medicare Annual Wellness Visit (AWV) in office.   If not able to come in office, please offer to do virtually.  45 minute appointment  Last AWV 04/18/2019  Please schedule at anytime with the Nurse Health Advisor.

## 2020-11-19 ENCOUNTER — Other Ambulatory Visit: Payer: Self-pay | Admitting: Internal Medicine

## 2020-11-19 DIAGNOSIS — I70209 Unspecified atherosclerosis of native arteries of extremities, unspecified extremity: Secondary | ICD-10-CM

## 2020-11-19 DIAGNOSIS — E1151 Type 2 diabetes mellitus with diabetic peripheral angiopathy without gangrene: Secondary | ICD-10-CM

## 2020-11-19 DIAGNOSIS — IMO0002 Reserved for concepts with insufficient information to code with codable children: Secondary | ICD-10-CM

## 2020-11-19 DIAGNOSIS — E1165 Type 2 diabetes mellitus with hyperglycemia: Secondary | ICD-10-CM

## 2020-12-01 ENCOUNTER — Ambulatory Visit: Payer: Medicare PPO | Admitting: Registered"

## 2020-12-31 ENCOUNTER — Ambulatory Visit: Payer: Medicare PPO | Admitting: Internal Medicine

## 2020-12-31 ENCOUNTER — Encounter: Payer: Self-pay | Admitting: Cardiology

## 2020-12-31 NOTE — Progress Notes (Signed)
Cardiology Office Note   Date:  01/01/2021   ID:  Jason [name], DOB 1941/09/24, MRN [id number]  PCP:  Jason Lima, MD  Cardiologist:   Minus Breeding, MD   Chief Complaint  Patient presents with  . Shortness of Breath      History of Present Illness: Jason [name] is a 80 y.o. male who I saw in 2017.  He had MVP and moderate MS. he has been doing relatively well although he says he is a little more short of breath. He says he could walk 20 yards without getting short of breath. He is not describing any resting PND or orthopnea. He is not describing any palpitations, presyncope or syncope. He denies any chest pressure, neck or arm discomfort. He has had amputations of all toes on his right foot and so his balance is off. He does not walk as much as he should but he still does his grocery shopping and brings the groceries in. He has no weight gain or edema.  Of note he did have stress perfusion study in December to evaluate shortness of breath and he had no evidence of ischemia.   Past Medical History:  Diagnosis Date  . Anxiety   . Asthma    in past, no inhaler now  . Benign neoplasm of colon   . Bone infection ([place])   . Cancer [facility])    bladder cancer  . COPD (chronic obstructive pulmonary disease) ([place])   . Depression   . Diverticulosis of colon (without mention of hemorrhage)   . DJD (degenerative joint disease)   . Esophageal reflux   . Glaucoma   . Headache   . Hidradenitis   . History of BPH   . History of gynecomastia    Unilateral  . Hyperlipidemia   . Irritable bowel syndrome   . Memory loss   . Mitral stenosis    mild-moderate MS 01/2016  . MVP (mitral valve prolapse)   . Obesity, unspecified   . Pneumonia   . Sleep apnea    does not use  cpap or bipap .. no study  ever  . Type II or unspecified type diabetes mellitus without mention of complication, not stated as uncontrolled   . Unspecified venous (peripheral) insufficiency     Past  Surgical History:  Procedure Laterality Date  . ACHILLES TENDON SURGERY Right 05/17/2019   Procedure: ACHILLES LENGTHENING/KIDNER;  Surgeon: Evelina Bucy, DPM;  Location: [place];  Service: Podiatry;  Laterality: Right;  . AMPUTATION  08/09/2012   Procedure: AMPUTATION DIGIT;  Surgeon: Newt Minion, MD;  Location: [place];  Service: Orthopedics;  Laterality: Right;  Amputation right Great Toe MTP Joint  . AMPUTATION  10/17/2012   Procedure: AMPUTATION DIGIT;  Surgeon: Newt Minion, MD;  Location: [place];  Service: Orthopedics;  Laterality: Right;  Right Foot 3rd Toe Amputation at Metatarsophalangeal Joint  . AMPUTATION TOE Right 05/17/2019   Procedure: AMPUTATION TOE Metatarsal Phalangeal Joint   FOURTH AND FIFTH, FILLETED TOE FLAP;  Surgeon: Evelina Bucy, DPM;  Location: [place];  Service: Podiatry;  Laterality: Right;  . CHOLECYSTECTOMY N/A 06/14/2018   Procedure: LAPAROSCOPIC CHOLECYSTECTOMY WITH INTRAOPERATIVE CHOLANGIOGRAM;  Surgeon: Kieth Brightly Arta Bruce, MD;  Location: [facility];  Service: General;  Laterality: N/A;  . CIRCUMCISION  06/2100   For Phimosis - Dr Hartley Barefoot  . COLON SURGERY    . CYSTOSCOPY W/ RETROGRADES Bilateral 07/07/2018   Procedure: CYSTOSCOPY WITH BILATERAL RETROGRADE PYELOGRAM;  Surgeon: Ardis Hughs, MD;  Location: [facility];  Service: Urology;  Laterality: Bilateral;  . KNEE ARTHROSCOPY    . LAPAROSCOPIC LYSIS OF ADHESIONS  06/14/2018   Procedure: LAPAROSCOPIC LYSIS OF ADHESIONS;  Surgeon: Kieth Brightly Arta Bruce, MD;  Location: [facility];  Service: General;;  . LAPAROTOMY  07/20/11  . LASER ABLATION  06/2009   Left Greater Saphenous vein -Dr Donnetta Hutching  . METATARSAL OSTEOTOMY Right 05/17/2019   Procedure: METATARSLSECTOMY FOURTH DIGIT;  Surgeon: Evelina Bucy, DPM;  Location: [place];  Service: Podiatry;  Laterality: Right;  . MULTIPLE TOOTH EXTRACTIONS    . TOE AMPUTATION  06/2009   x3Right foot second toe -Dr Beola Cord  . TONSILLECTOMY    . TRANSURETHRAL RESECTION OF BLADDER  TUMOR N/A 07/07/2018   Procedure: TRANSURETHRAL RESECTION OF BLADDER TUMOR (TURBT) WITH POST OP INSTILLATION OF GEMCITABIN;  Surgeon: Ardis Hughs, MD;  Location[facility];  Service: Urology;  Laterality: N/A;  . WOUND DEBRIDEMENT Right 07/25/2019   Procedure: Right foot wound debridement with application of skin graft substitute;  Surgeon: Evelina Bucy, DPM;  Location: [facility];  Service: Podiatry;  Laterality: Right;     Current Outpatient Medications  Medication Sig Dispense Refill  . blood glucose meter kit and supplies KIT Use to check blood sugar daily. DX: E11. 1 each 0  . Continuous Blood Gluc Receiver (FREESTYLE LIBRE 14 DAY READER) DEVI 1 Act by Does not apply route daily. 2 each 5  . Continuous Blood Gluc Sensor (FREESTYLE LIBRE 14 DAY SENSOR) MISC 1 Act by Does not apply route daily. 2 each 5  . Glucagon (GVOKE HYPOPEN 2-PACK) 1 MG/0.2ML SOAJ Inject 1 Act into the skin daily as needed. 2 mL 5  . glucose blood (COOL BLOOD GLUCOSE TEST STRIPS) test strip Use to check blood sugar daily. DX: E11. 100 each 3  . Insulin Pen Needle 32G X 6 MM MISC 1 Act by Does not apply route in the morning and at bedtime. 100 each 3  . Lancets (ACCU-CHEK SOFT TOUCH) lancets Use to check blood sugar daily. DX: E11. 100 each 3  . metFORMIN (GLUCOPHAGE) 500 MG tablet TAKE 2 TABLETS TWICE DAILY WITH MEALS 360 tablet 0  . Multiple Vitamins-Minerals (MULTIVITAMIN WITH MINERALS) tablet Take 1 tablet by mouth daily.    . rivaroxaban (XARELTO) 20 MG TABS tablet Take 1 tablet (20 mg total) by mouth daily with supper. 90 tablet 3  . simvastatin (ZOCOR) 40 MG tablet TAKE 1 TABLET EVERY DAY  AT  6 PM 90 tablet 1  . vitamin C (ASCORBIC ACID) 500 MG tablet Take 1,000 mg by mouth daily.    . Wound Dressings (MEDIHONEY WOUND/BURN DRESSING) GEL Apply to affected are 3 times a week, and cover with sterile dressing. 15 mL 2  . insulin glargine, 2 Unit Dial, (TOUJEO MAX SOLOSTAR) 300 UNIT/ML Solostar Pen Inject 30 Units  into the skin daily. (Patient not taking: Reported on 01/01/2021) 9 mL 1  . Semaglutide,0.25 or 0.5MG/DOS, (OZEMPIC, 0.25 OR 0.5 MG/DOSE,) 2 MG/1.5ML SOPN Inject 0.5 mg into the skin once a week. (Patient not taking: Reported on 01/01/2021) 9 mL 1  . thiamine (VITAMIN B-1) 50 MG tablet Take 1 tablet (50 mg total) by mouth daily. (Patient not taking: Reported on 01/01/2021) 90 tablet 1   No current facility-administered medications for this visit.    Allergies:   Bactrim [sulfamethoxazole-trimethoprim]    ROS:  Please see the history of present illness.   Otherwise, review  of systems are positive for none.   All other systems are reviewed and negative.    PHYSICAL EXAM: VS:  BP (!) 122/42   Pulse 64   Ht '6\' 8"'  (2.032 m)   Wt (!) 302 lb 3.2 oz (137.1 kg)   SpO2 93%   BMI 33.20 kg/m  , BMI Body mass index is 33.2 kg/m. GENERAL:  Well appearing NECK:  No jugular venous distention, waveform within normal limits, carotid upstroke brisk and symmetric, no bruits, no thyromegaly LUNGS:  Clear to auscultation bilaterally CHEST:  Unremarkable HEART:  PMI not displaced or sustained,S1 and S2 within normal limits, no S3, no S4, no clicks, no rubs, 2 out of 6 systolic murmur radiating slightly to the axilla, 2 out of 6 diastolic murmur that increases with exercise murmurs ABD:  Flat, positive bowel sounds normal in frequency in pitch, no bruits, no rebound, no guarding, no midline pulsatile mass, no hepatomegaly, no splenomegaly EXT:  2 plus pulses throughout, no edema, no cyanosis no clubbing    EKG:  EKG is  ordered today. Flutter waves evident, rate 64, axis within normal limits, intervals within normal limits, no acute ST-T wave changes.   Recent Labs: 05/21/2020: ALT 11 09/30/2020: BUN 14; Creatinine, Ser 1.11; Hemoglobin 13.1; Platelets 165.0; Potassium 4.4; Pro B Natriuretic peptide (BNP) 117.0; Sodium 135    Lipid Panel    Component Value Date/Time   CHOL 147 05/21/2020 1458   TRIG  100 05/21/2020 1458   HDL 49 05/21/2020 1458   CHOLHDL 3.0 05/21/2020 1458   VLDL 16.0 04/18/2019 1035   LDLCALC 79 05/21/2020 1458      Wt Readings from Last 3 Encounters:  01/01/21 (!) 302 lb 3.2 oz (137.1 kg)  10/21/20 (!) 304 lb (137.9 kg)  09/30/20 (!) 304 lb (137.9 kg)      Other studies Reviewed: Additional studies/ records that were reviewed today include: Labs, echo Review of the above records demonstrates:  See below    ASSESSMENT AND PLAN:  Mitral stenosis:  Mod to severe MS.  He has had mild mitral regurgitation.  This was unchanged on echo in Sept last year. I will repeat an echo next month as it would have been 6 months. I need to keep a close clinical eye on this.  ATRIAL FLUTTER: EKG is consistent with flutter waves. He has been on low-dose Xalreto by the records.  He needs to be on therapeutic 20 mg Xarelto .   I will do this and stop his aspirin. He has no excessive bleeding risk.  Hyperlipidemia: His LDL was 79 with an HDL of 49. No change in therapy.  DM2:   A1c was up to 9.0. He is having some trouble sticking his finger in regulating his sugar. We are going to try to get him some diabetes education.    Current medicines are reviewed at length with the patient today.  The patient does not have concerns regarding medicines.  The following changes have been made:  As above  Labs/ tests ordered today include: NA  Orders Placed This Encounter  Procedures  . Ambulatory referral to Nutrition and Diabetic Education  . LONG TERM MONITOR (3-14 DAYS)  . EKG 12-Lead  . ECHOCARDIOGRAM COMPLETE     Disposition:   FU with me after the above studies.     Signed, Minus Breeding, MD  01/01/2021 1:02 PM    [place]

## 2021-01-01 ENCOUNTER — Encounter: Payer: Self-pay | Admitting: Radiology

## 2021-01-01 ENCOUNTER — Ambulatory Visit (INDEPENDENT_AMBULATORY_CARE_PROVIDER_SITE_OTHER): Payer: Medicare PPO

## 2021-01-01 ENCOUNTER — Encounter: Payer: Self-pay | Admitting: Cardiology

## 2021-01-01 ENCOUNTER — Other Ambulatory Visit: Payer: Self-pay

## 2021-01-01 ENCOUNTER — Telehealth: Payer: Self-pay

## 2021-01-01 ENCOUNTER — Ambulatory Visit (INDEPENDENT_AMBULATORY_CARE_PROVIDER_SITE_OTHER): Payer: Medicare PPO | Admitting: Cardiology

## 2021-01-01 VITALS — BP 122/42 | HR 64 | Ht >= 80 in | Wt 302.2 lb

## 2021-01-01 DIAGNOSIS — E785 Hyperlipidemia, unspecified: Secondary | ICD-10-CM | POA: Diagnosis not present

## 2021-01-01 DIAGNOSIS — E1142 Type 2 diabetes mellitus with diabetic polyneuropathy: Secondary | ICD-10-CM

## 2021-01-01 DIAGNOSIS — I4892 Unspecified atrial flutter: Secondary | ICD-10-CM | POA: Diagnosis not present

## 2021-01-01 DIAGNOSIS — I059 Rheumatic mitral valve disease, unspecified: Secondary | ICD-10-CM

## 2021-01-01 MED ORDER — RIVAROXABAN 20 MG PO TABS: 20.0000 mg | ORAL_TABLET | Freq: Every day | ORAL | 3 refills | Status: DC

## 2021-01-01 NOTE — Patient Instructions (Addendum)
Medication Instructions:  Stop previous Xarelto dose Change Xarelto to 20mg  once a day Stop Aspirin *If you need a refill on your cardiac medications before your next appointment, please call your pharmacy*  Testing/Procedures: Your physician has recommended that you wear an event monitor for 3 days. Event monitors are medical devices that record the heart's electrical activity. Doctors most often [place] these monitors to diagnose arrhythmias. Arrhythmias are problems with the speed or rhythm of the heartbeat. The monitor is a small, portable device. You can wear one while you do your normal daily activities. This is usually used to diagnose what is causing palpitations/syncope (passing out). CALL [phone] IF YOU NEED ASSISTANCE WITH PUTTING MONITOR ON  Your physician has requested that you have an echocardiogram. Echocardiography is a painless test that uses sound waves to create images of your heart. It provides your doctor with information about the size and shape of your heart and how well your heart's chambers and valves are working. This procedure takes approximately one hour. There are no restrictions for this procedure. [address]  Follow-Up: At [facility], you and your health needs are our priority.  As part of our continuing mission to provide you with exceptional heart care, we have created designated Provider Care Teams.  These Care Teams include your primary Cardiologist (physician) and Advanced Practice Providers (APPs -  Physician Assistants and Nurse Practitioners) who all work together to provide you with the care you need, when you need it.  Your next appointment:   In March after Echo  The format for your next appointment:   In Person  Provider:   Minus Breeding, MD  Other Instructions Referred for Diabetic Education  ZIO XT- Long Term Monitor Instructions   Your physician has requested you wear your ZIO patch monitor_3_days.   This is a  single patch monitor.  [employer] supplies one patch monitor per enrollment.  Additional stickers are not available.   Please do not apply patch if you will be having a Nuclear Stress Test, Echocardiogram, Cardiac CT, MRI, or Chest Xray during the time frame you would be wearing the monitor. The patch cannot be worn during these tests.  You cannot remove and re-apply the ZIO XT patch monitor.   Your ZIO patch monitor will be sent USPS Priority mail from [facility] directly to your home address. The monitor may also be mailed to a PO BOX if home delivery is not available.   It may take 3-5 days to receive your monitor after you have been enrolled.   Once you have received you monitor, please review enclosed instructions.  Your monitor has already been registered assigning a specific monitor serial # to you.   Applying the monitor   Shave hair from upper left chest.   Hold abrader disc by orange tab.  Rub abrader in 40 strokes over left upper chest as indicated in your monitor instructions.   Clean area with 4 enclosed alcohol pads .  Use all pads to assure are is cleaned thoroughly.  Let dry.   Apply patch as indicated in monitor instructions.  Patch will be place under collarbone on left side of chest with arrow pointing upward.   Rub patch adhesive wings for 2 minutes.Remove white label marked "1".  Remove white label marked "2".  Rub patch adhesive wings for 2 additional minutes.   While looking in a mirror, press and release button in center of patch.  A small green light will  flash 3-4 times .  This will be your only indicator the monitor has been turned on.     Do not shower for the first 24 hours.  You may shower after the first 24 hours.   Press button if you feel a symptom. You will hear a small click.  Record Date, Time and Symptom in the Patient Log Book.   When you are ready to remove patch, follow instructions on last 2 pages of Patient Log Book.  Stick patch monitor onto  last page of Patient Log Book.   Place Patient Log Book in [place] box.  Use locking tab on box and tape box closed securely.  The [employer] has [employer] on it.  Please place in mailbox as soon as possible.  Your physician should have your test results approximately 7 days after the monitor has been mailed back to [facility].   Call [place] at [phone] if you have questions regarding your ZIO XT patch monitor.  Call them immediately if you see an orange light blinking on your monitor.   If your monitor falls off in less than 4 days contact our Monitor department at [phone].  If your monitor becomes loose or falls off after 4 days call [employer] at [phone] for suggestions on securing your monitor.

## 2021-01-01 NOTE — Telephone Encounter (Signed)
Called and lmom pt stated Needs appt with chris pavero at [address] for diabetes education

## 2021-01-01 NOTE — Progress Notes (Signed)
Enrolled patient for a 3 day Zio XT monitor to be mailed to patients home  

## 2021-01-05 DIAGNOSIS — I4892 Unspecified atrial flutter: Secondary | ICD-10-CM | POA: Diagnosis not present

## 2021-01-06 ENCOUNTER — Ambulatory Visit (INDEPENDENT_AMBULATORY_CARE_PROVIDER_SITE_OTHER): Payer: Medicare PPO | Admitting: Internal Medicine

## 2021-01-06 ENCOUNTER — Encounter: Payer: Self-pay | Admitting: Internal Medicine

## 2021-01-06 ENCOUNTER — Other Ambulatory Visit: Payer: Self-pay

## 2021-01-06 VITALS — BP 136/84 | HR 64 | Temp 98.3°F | Resp 16 | Ht >= 80 in | Wt 298.0 lb

## 2021-01-06 DIAGNOSIS — E118 Type 2 diabetes mellitus with unspecified complications: Secondary | ICD-10-CM

## 2021-01-06 DIAGNOSIS — E1165 Type 2 diabetes mellitus with hyperglycemia: Secondary | ICD-10-CM | POA: Diagnosis not present

## 2021-01-06 DIAGNOSIS — I700 Atherosclerosis of aorta: Secondary | ICD-10-CM | POA: Diagnosis not present

## 2021-01-06 DIAGNOSIS — IMO0002 Reserved for concepts with insufficient information to code with codable children: Secondary | ICD-10-CM

## 2021-01-06 LAB — BASIC METABOLIC PANEL
BUN: 9 mg/dL (ref 6–23)
CO2: 28 mEq/L (ref 19–32)
Calcium: 9.7 mg/dL (ref 8.4–10.5)
Chloride: 102 mEq/L (ref 96–112)
Creatinine, Ser: 0.95 mg/dL (ref 0.40–1.50)
GFR: 76.5 mL/min (ref >60.00)
Glucose, Bld: 182 mg/dL — ABNORMAL HIGH (ref 70–99)
Potassium: 4.5 mEq/L (ref 3.5–5.1)
Sodium: 141 mEq/L (ref 135–145)

## 2021-01-06 LAB — HEMOGLOBIN A1C: Hgb A1c MFr Bld: 8.2 % — ABNORMAL HIGH (ref 4.6–6.5)

## 2021-01-06 NOTE — Progress Notes (Unsigned)
Subjective:  Patient ID: Jason [name], male    DOB: 1941-03-26  Age: 80 y.o. MRN: [id number]  CC: Diabetes  This visit occurred during the SARS-CoV-2 public health emergency.  Safety protocols were in place, including screening questions prior to the visit, additional usage of staff PPE, and extensive cleaning of exam room while observing appropriate contact time as indicated for disinfecting solutions.    HPI JUDAS MOHAMMAD presents for f/up - He thinks his blood sugar has been well controlled.  He denies polys.  He is not very active but denies CP, DOE, palpitations, edema, fatigue, dizziness, or lightheadedness.  Outpatient Medications Prior to Visit  Medication Sig Dispense Refill  . blood glucose meter kit and supplies KIT Use to check blood sugar daily. DX: E11. 1 each 0  . Continuous Blood Gluc Receiver (FREESTYLE LIBRE 14 DAY READER) DEVI 1 Act by Does not apply route daily. 2 each 5  . Continuous Blood Gluc Sensor (FREESTYLE LIBRE 14 DAY SENSOR) MISC 1 Act by Does not apply route daily. 2 each 5  . Glucagon (GVOKE HYPOPEN 2-PACK) 1 MG/0.2ML SOAJ Inject 1 Act into the skin daily as needed. 2 mL 5  . glucose blood (COOL BLOOD GLUCOSE TEST STRIPS) test strip Use to check blood sugar daily. DX: E11. 100 each 3  . insulin glargine, 2 Unit Dial, (TOUJEO MAX SOLOSTAR) 300 UNIT/ML Solostar Pen Inject 30 Units into the skin daily. 9 mL 1  . Insulin Pen Needle 32G X 6 MM MISC 1 Act by Does not apply route in the morning and at bedtime. 100 each 3  . Lancets (ACCU-CHEK SOFT TOUCH) lancets Use to check blood sugar daily. DX: E11. 100 each 3  . metFORMIN (GLUCOPHAGE) 500 MG tablet TAKE 2 TABLETS TWICE DAILY WITH MEALS 360 tablet 0  . Multiple Vitamins-Minerals (MULTIVITAMIN WITH MINERALS) tablet Take 1 tablet by mouth daily.    . rivaroxaban (XARELTO) 20 MG TABS tablet Take 1 tablet (20 mg total) by mouth daily with supper. 90 tablet 3  . Semaglutide,0.25 or 0.5MG /DOS, (OZEMPIC, 0.25 OR 0.5  MG/DOSE,) 2 MG/1.5ML SOPN Inject 0.5 mg into the skin once a week. 9 mL 1  . simvastatin (ZOCOR) 40 MG tablet TAKE 1 TABLET EVERY DAY  AT  6 PM 90 tablet 1  . thiamine (VITAMIN B-1) 50 MG tablet Take 1 tablet (50 mg total) by mouth daily. 90 tablet 1  . vitamin C (ASCORBIC ACID) 500 MG tablet Take 1,000 mg by mouth daily.    . Wound Dressings (MEDIHONEY WOUND/BURN DRESSING) GEL Apply to affected are 3 times a week, and cover with sterile dressing. 15 mL 2   No facility-administered medications prior to visit.    ROS Review of Systems  Constitutional: Negative for appetite change, diaphoresis, fatigue and fever.  HENT: Negative.   Eyes: Negative for visual disturbance.  Respiratory: Negative for cough, chest tightness, shortness of breath and wheezing.   Cardiovascular: Negative for chest pain, palpitations and leg swelling.  Gastrointestinal: Negative.  Negative for abdominal pain.  Endocrine: Negative.  Negative for polydipsia, polyphagia and polyuria.  Genitourinary: Negative.  Negative for difficulty urinating, hematuria and urgency.  Musculoskeletal: Negative.  Negative for myalgias.  Skin: Negative.   Neurological: Negative.  Negative for dizziness, weakness and light-headedness.  Hematological: Negative for adenopathy. Does not bruise/bleed easily.  Psychiatric/Behavioral: Negative.     Objective:  BP 136/84   Pulse 64   Temp 98.3 F (36.8 C) (Oral)   Resp  16   Ht $R'6\' 8"'wU$  (2.032 m)   Wt 298 lb (135.2 kg)   SpO2 96%   BMI 32.74 kg/m   BP Readings from Last 3 Encounters:  01/06/21 136/84  01/01/21 (!) 122/42  09/30/20 122/74    Wt Readings from Last 3 Encounters:  01/06/21 298 lb (135.2 kg)  01/01/21 (!) 302 lb 3.2 oz (137.1 kg)  10/21/20 (!) 304 lb (137.9 kg)    Physical Exam Vitals reviewed.  Constitutional:      Appearance: He is obese.  HENT:     Nose: Nose normal.     Mouth/Throat:     Mouth: Mucous membranes are moist.  Eyes:     General: No scleral  icterus.    Conjunctiva/sclera: Conjunctivae normal.  Cardiovascular:     Rate and Rhythm: Normal rate and regular rhythm.     Heart sounds: Murmur heard.   Systolic murmur is present with a grade of 1/6. No gallop.   Pulmonary:     Effort: Pulmonary effort is normal.     Breath sounds: No stridor. No wheezing, rhonchi or rales.  Abdominal:     General: Abdomen is protuberant. Bowel sounds are normal. There is no distension.     Palpations: Abdomen is soft. There is no hepatomegaly, splenomegaly or mass.     Tenderness: There is no abdominal tenderness.  Musculoskeletal:     Cervical back: Neck supple.     Right lower leg: Edema (trace pitting) present.     Left lower leg: Edema (trace pitting) present.  Lymphadenopathy:     Cervical: No cervical adenopathy.  Skin:    General: Skin is warm and dry.  Neurological:     General: No focal deficit present.     Mental Status: He is alert.  Psychiatric:        Mood and Affect: Mood normal.        Behavior: Behavior normal.     Lab Results  Component Value Date   WBC 4.1 09/30/2020   HGB 13.1 09/30/2020   HCT 39.1 09/30/2020   PLT 165.0 09/30/2020   GLUCOSE 182 (H) 01/06/2021   CHOL 147 05/21/2020   TRIG 100 05/21/2020   HDL 49 05/21/2020   LDLCALC 79 05/21/2020   ALT 11 05/21/2020   AST 12 05/21/2020   NA 141 01/06/2021   K 4.5 01/06/2021   CL 102 01/06/2021   CREATININE 0.95 01/06/2021   BUN 9 01/06/2021   CO2 28 01/06/2021   TSH 0.85 04/18/2019   PSA 1.13 04/18/2019   INR 1.05 10/17/2012   HGBA1C 8.2 (H) 01/06/2021   MICROALBUR 2.1 05/21/2020    VAS [place] ABI WITH/WO TBI  Result Date: 07/08/2020 LOWER EXTREMITY DOPPLER STUDY Indications: Peripheral artery disease, and right great toe amputation. High Risk Factors: Hyperlipidemia, Diabetes.  Comparison Study: 05/01/19 Performing Technologist: June Leap RDMS, RVT  Examination Guidelines: A complete evaluation includes at minimum, Doppler waveform signals and systolic  blood pressure reading at the level of bilateral brachial, anterior tibial, and posterior tibial arteries, when vessel segments are accessible. Bilateral testing is considered an integral part of a complete examination. Photoelectric Plethysmograph (PPG) waveforms and toe systolic pressure readings are included as required and additional duplex testing as needed. Limited examinations for reoccurring indications may be performed as noted.  ABI Findings: +--------+------------------+-----+---------+--------+ Right   Rt Pressure (mmHg)IndexWaveform Comment  +--------+------------------+-----+---------+--------+ [id number]                                      +--------+------------------+-----+---------+--------+  ATA     168               1.24 triphasic         +--------+------------------+-----+---------+--------+ PTA     123               0.90 triphasic         +--------+------------------+-----+---------+--------+ +---------+------------------+-----+---------+-------+ Left     Lt Pressure (mmHg)IndexWaveform Comment +---------+------------------+-----+---------+-------+ ATA      177               1.30 triphasic        +---------+------------------+-----+---------+-------+ PTA      163               1.20 triphasic        +---------+------------------+-----+---------+-------+ Great Toe145               1.07 Normal           +---------+------------------+-----+---------+-------+ +-------+-----------+-----------+------------+------------+ ABI/TBIToday's ABIToday's TBIPrevious ABIPrevious TBI +-------+-----------+-----------+------------+------------+ Right  1.24                  1.2                      +-------+-----------+-----------+------------+------------+ Left   1.3                   [place]                       +-------+-----------+-----------+------------+------------+ Arterial wall calcification precludes accurate ankle pressures and ABIs.  Summary:  Right: Resting right ankle-brachial index is within normal range. No evidence of significant right lower extremity arterial disease. Left: Resting left ankle-brachial index is within normal range. No evidence of significant left lower extremity arterial disease.  *See table(s) above for measurements and observations.  Electronically signed by Curt Jews MD on 07/08/2020 at 3:57:28 PM.   Final     Assessment & Plan:   Aasim was seen today for diabetes.  Diagnoses and all orders for this visit:  Diabetes mellitus type 2, uncontrolled, with complications ([place])- His A1c is improved to 8.2%.  Will continue the combination of basal insulin, Metformin, and a GLP-1 agonist. -     Basic metabolic panel; Future -     Hemoglobin A1c; Future -     Hemoglobin A1c -     Basic metabolic panel  Atherosclerosis of aorta (HCC)- Will continue to address risk factor modifications.   I am having Erling Conte. Ferrucci maintain his vitamin C, multivitamin with minerals, Medihoney Wound/Burn Dressing, blood glucose meter kit and supplies, Cool Blood Glucose Test Strips, accu-chek soft touch, FreeStyle Libre 14 Day Sensor, [employer] 14 Day Reader, thiamine, simvastatin, Ozempic (0.25 or 0.5 MG/DOSE), Toujeo Max SoloStar, Gvoke HypoPen 2-Pack, Insulin Pen Needle, metFORMIN, and rivaroxaban.  No orders of the defined types were placed in this encounter.    Follow-up: Return in about 4 months (around 05/06/2021).  Scarlette Calico, MD

## 2021-01-06 NOTE — Patient Instructions (Signed)
Type 2 Diabetes Mellitus, Diagnosis, Adult Type 2 diabetes (type 2 diabetes mellitus) is a long-term, or chronic, disease. In type 2 diabetes, one or both of these problems may be present:  The pancreas does not make enough of a hormone called insulin.  Cells in the body do not respond properly to insulin that the body makes (insulin resistance). Normally, insulin allows blood sugar (glucose) to enter cells in the body. The cells use glucose for energy. Insulin resistance or lack of insulin causes excess glucose to build up in the blood instead of going into cells. This causes high blood glucose (hyperglycemia).  What are the causes? The exact cause of type 2 diabetes is not known. What increases the risk? The following factors may make you more likely to develop this condition:  Having a family member with type 2 diabetes.  Being overweight or obese.  Being inactive (sedentary).  Having been diagnosed with insulin resistance.  Having a history of prediabetes, diabetes when you were pregnant (gestational diabetes), or polycystic ovary syndrome (PCOS). What are the signs or symptoms? In the early stage of this condition, you may not have symptoms. Symptoms develop slowly and may include:  Increased thirst or hunger.  Increased urination.  Unexplained weight loss.  Tiredness (fatigue) or weakness.  Vision changes, such as blurry vision.  Dark patches on the skin. How is this diagnosed? This condition is diagnosed based on your symptoms, your medical history, a physical exam, and your blood glucose level. Your blood glucose may be checked with one or more of the following blood tests:  A fasting blood glucose (FBG) test. You will not be allowed to eat (you will fast) for 8 hours or longer before a blood sample is taken.  A random blood glucose test. This test checks blood glucose at any time of day regardless of when you ate.  An A1C (hemoglobin A1C) blood test. This test  provides information about blood glucose levels over the previous 2-3 months.  An oral glucose tolerance test (OGTT). This test measures your blood glucose at two times: ? After fasting. This is your baseline blood glucose level. ? Two hours after drinking a beverage that contains glucose. You may be diagnosed with type 2 diabetes if:  Your fasting blood glucose level is 126 mg/dL (7.0 mmol/L) or higher.  Your random blood glucose level is 200 mg/dL (11.1 mmol/L) or higher.  Your A1C level is 6.5% or higher.  Your oral glucose tolerance test result is higher than 200 mg/dL (11.1 mmol/L). These blood tests may be repeated to confirm your diagnosis.   How is this treated? Your treatment may be managed by a specialist called an endocrinologist. Type 2 diabetes may be treated by following instructions from your health care provider about:  Making dietary and lifestyle changes. These may include: ? Following a personalized nutrition plan that is developed by a registered dietitian. ? Exercising regularly. ? Finding ways to manage stress.  Checking your blood glucose level as often as told.  Taking diabetes medicines or insulin daily. This helps to keep your blood glucose levels in the healthy range.  Taking medicines to help prevent complications from diabetes. Medicines may include: ? Aspirin. ? Medicine to lower cholesterol. ? Medicine to control blood pressure. Your health care provider will set treatment goals for you. Your goals will be based on your age, other medical conditions you have, and how you respond to diabetes treatment. Generally, the goal of treatment is to maintain the   following blood glucose levels:  Before meals: 80-130 mg/dL (4.4-7.2 mmol/L).  After meals: below 180 mg/dL (10 mmol/L).  A1C level: less than 7%. Follow these instructions at home: Questions to ask your health care provider Consider asking the following questions:  Should I meet with a certified  diabetes care and education specialist?  What diabetes medicines do I need, and when should I take them?  What equipment will I need to manage my diabetes at home?  How often do I need to check my blood glucose?  Where can I find a support group for people with diabetes?  What number can I call if I have questions?  When is my next appointment? General instructions  Take over-the-counter and prescription medicines only as told by your health care provider.  Keep all follow-up visits as told by your health care provider. This is important. Where to find more information  American Diabetes Association (ADA): www.diabetes.org  American Association of Diabetes Care and Education Specialists (ADCES): www.diabeteseducator.org  International Diabetes Federation (IDF): www.idf.org Contact a health care provider if:  Your blood glucose is at or above 240 mg/dL (13.3 mmol/L) for 2 days in a row.  You have been sick or have had a fever for 2 days or longer, and you are not getting better.  You have any of the following problems for more than 6 hours: ? You cannot eat or drink. ? You have nausea and vomiting. ? You have diarrhea. Get help right away if:  You have severe hypoglycemia. This means your blood glucose is lower than 54 mg/dL (3.0 mmol/L).  You become confused or you have trouble thinking clearly.  You have difficulty breathing.  You have moderate or large ketone levels in your urine. These symptoms may represent a serious problem that is an emergency. Do not wait to see if the symptoms will go away. Get medical help right away. Call your local emergency services (911 in the [place] Do not drive yourself to the hospital. Summary  Type 2 diabetes (type 2 diabetes mellitus) is a long-term, or chronic, disease. In type 2 diabetes, the pancreas does not make enough of a hormone called insulin, or cells in the body do not respond properly to insulin that the body makes (insulin  resistance).  This condition is treated by making dietary and lifestyle changes and taking diabetes medicines or insulin.  Your health care provider will set treatment goals for you. Your goals will be based on your age, other medical conditions you have, and how you respond to diabetes treatment.  Keep all follow-up visits as told by your health care provider. This is important. This information is not intended to replace advice given to you by your health care provider. Make sure you discuss any questions you have with your health care provider. Document Revised: 05/28/2020 Document Reviewed: 05/28/2020 Elsevier Patient Education  2021 [employer] Inc.  

## 2021-01-15 ENCOUNTER — Telehealth: Payer: Self-pay | Admitting: Internal Medicine

## 2021-01-15 NOTE — Progress Notes (Signed)
  Chronic Care Management   Note  01/15/2021 Name: RENALD HAITHCOCK MRN: [id number] DOB: June 05, 1941  SHEFFIELD HAWKER is a 80 y.o. year old male who is a primary care patient of [name], MD. I reached out to Lucile Shutters by phone today in response to a referral sent by Mr. Wahid Holley Manes's PCP, [name], MD.   Mr. Cayer was given information about Chronic Care Management services today including:  1. CCM service includes personalized support from designated clinical staff supervised by his physician, including individualized plan of care and coordination with other care providers 2. 24/7 contact phone numbers for assistance for urgent and routine care needs. 3. Service will only be billed when office clinical staff spend 20 minutes or more in a month to coordinate care. 4. Only one practitioner may furnish and bill the service in a calendar month. 5. The patient may stop CCM services at any time (effective at the end of the month) by phone call to the office staff.   Patient agreed to services and verbal consent obtained.   Follow up plan:   Carley Perdue [employer] Scheduler

## 2021-01-16 ENCOUNTER — Encounter: Payer: Medicare PPO | Attending: Internal Medicine | Admitting: Registered"

## 2021-01-16 DIAGNOSIS — I4892 Unspecified atrial flutter: Secondary | ICD-10-CM | POA: Diagnosis not present

## 2021-01-16 DIAGNOSIS — E118 Type 2 diabetes mellitus with unspecified complications: Secondary | ICD-10-CM | POA: Insufficient documentation

## 2021-01-16 DIAGNOSIS — E1165 Type 2 diabetes mellitus with hyperglycemia: Secondary | ICD-10-CM | POA: Insufficient documentation

## 2021-01-21 IMAGING — DX RIGHT FOOT COMPLETE - 3+ VIEW
3 series · 3 of 3 positions shown · non-contrast
Comparison: 07/19/2018

CLINICAL DATA: Foot pain, open wound.

EXAM:
RIGHT FOOT COMPLETE - 3+ VIEW

[foot ap]
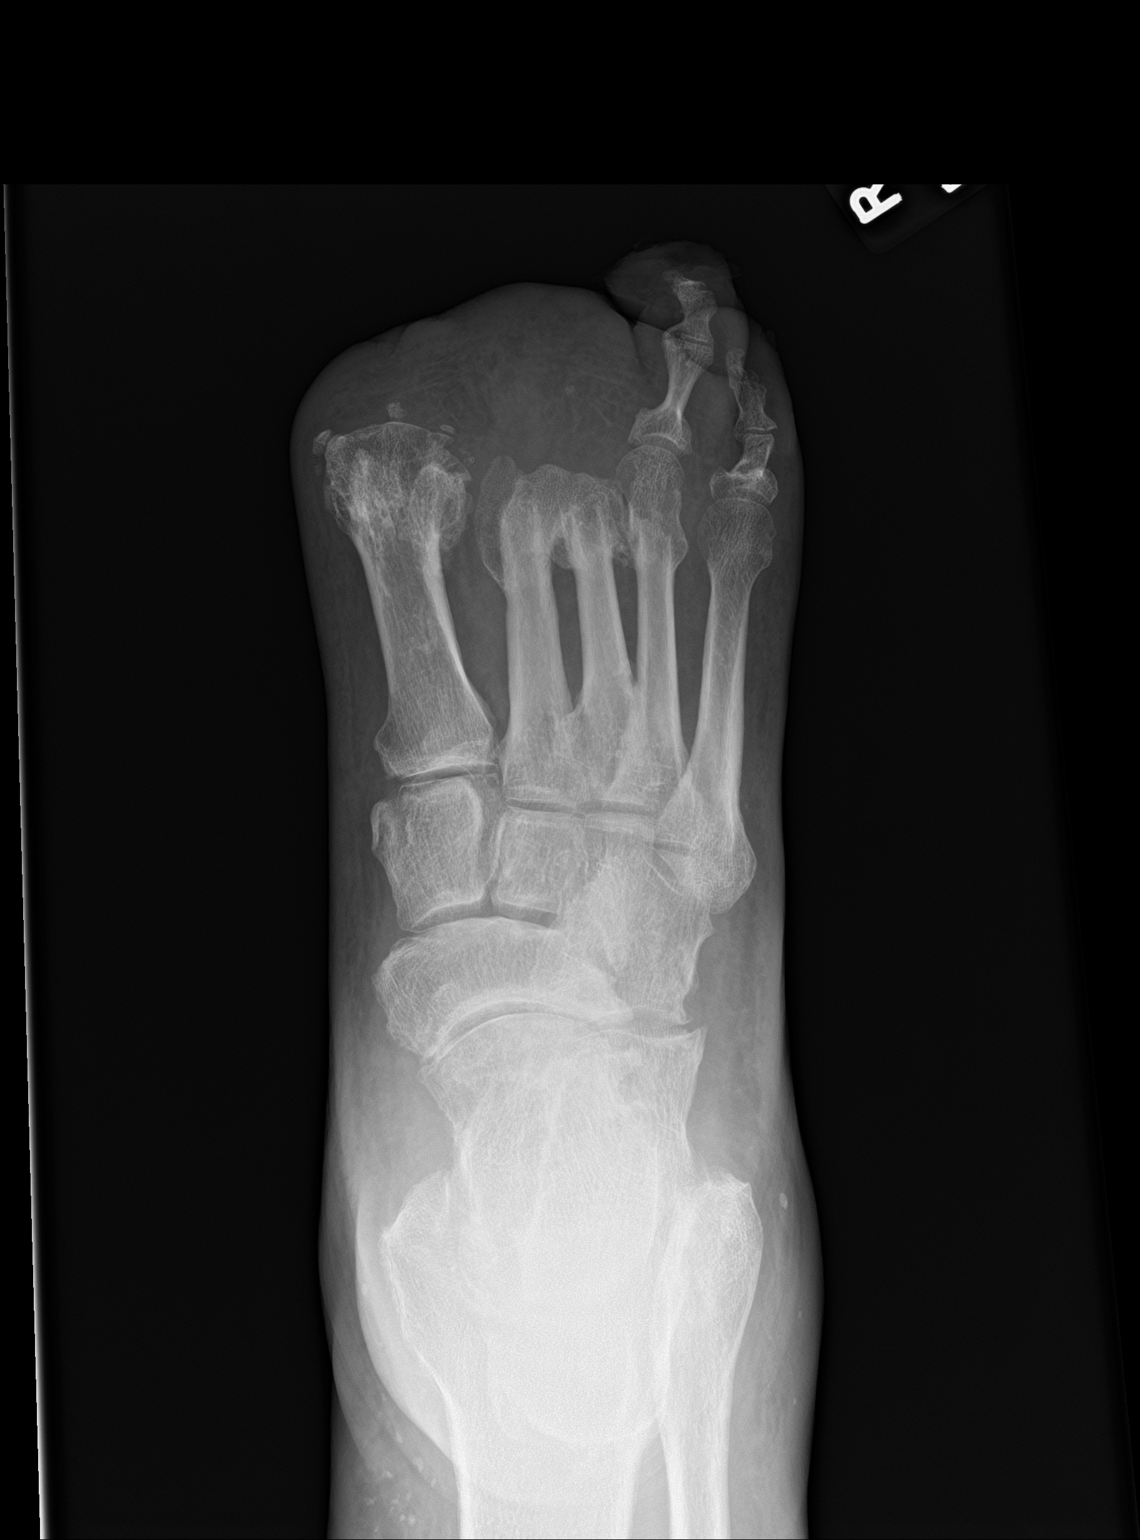

[foot obl]
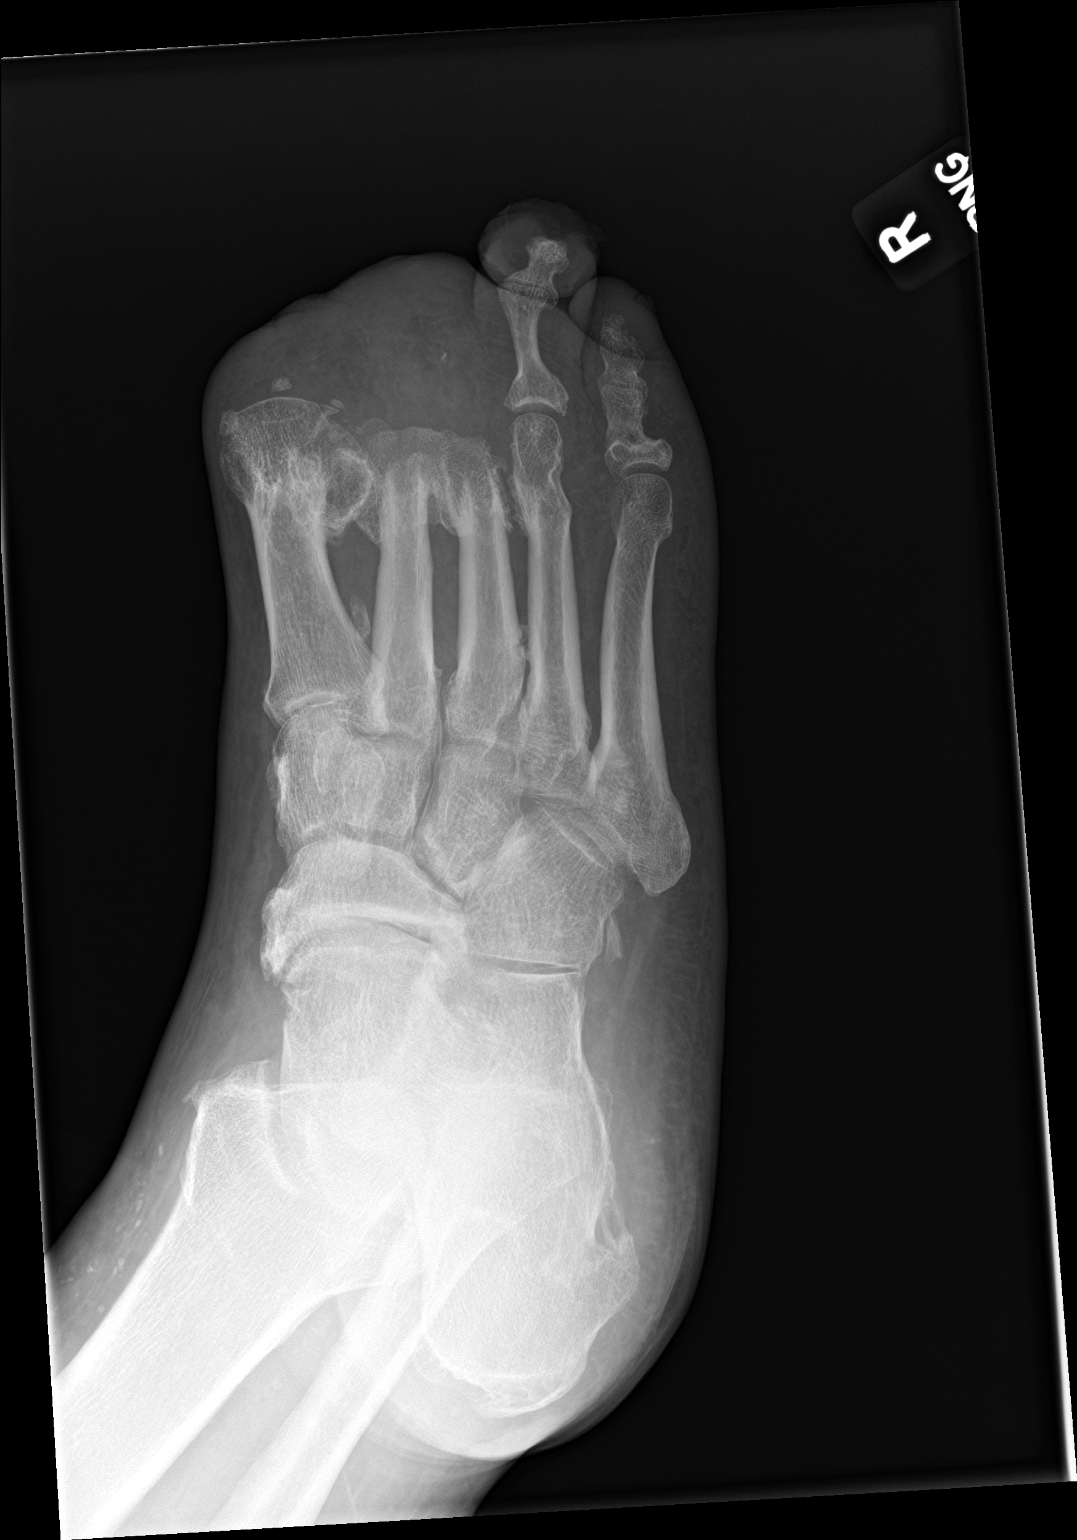

[foot lat]
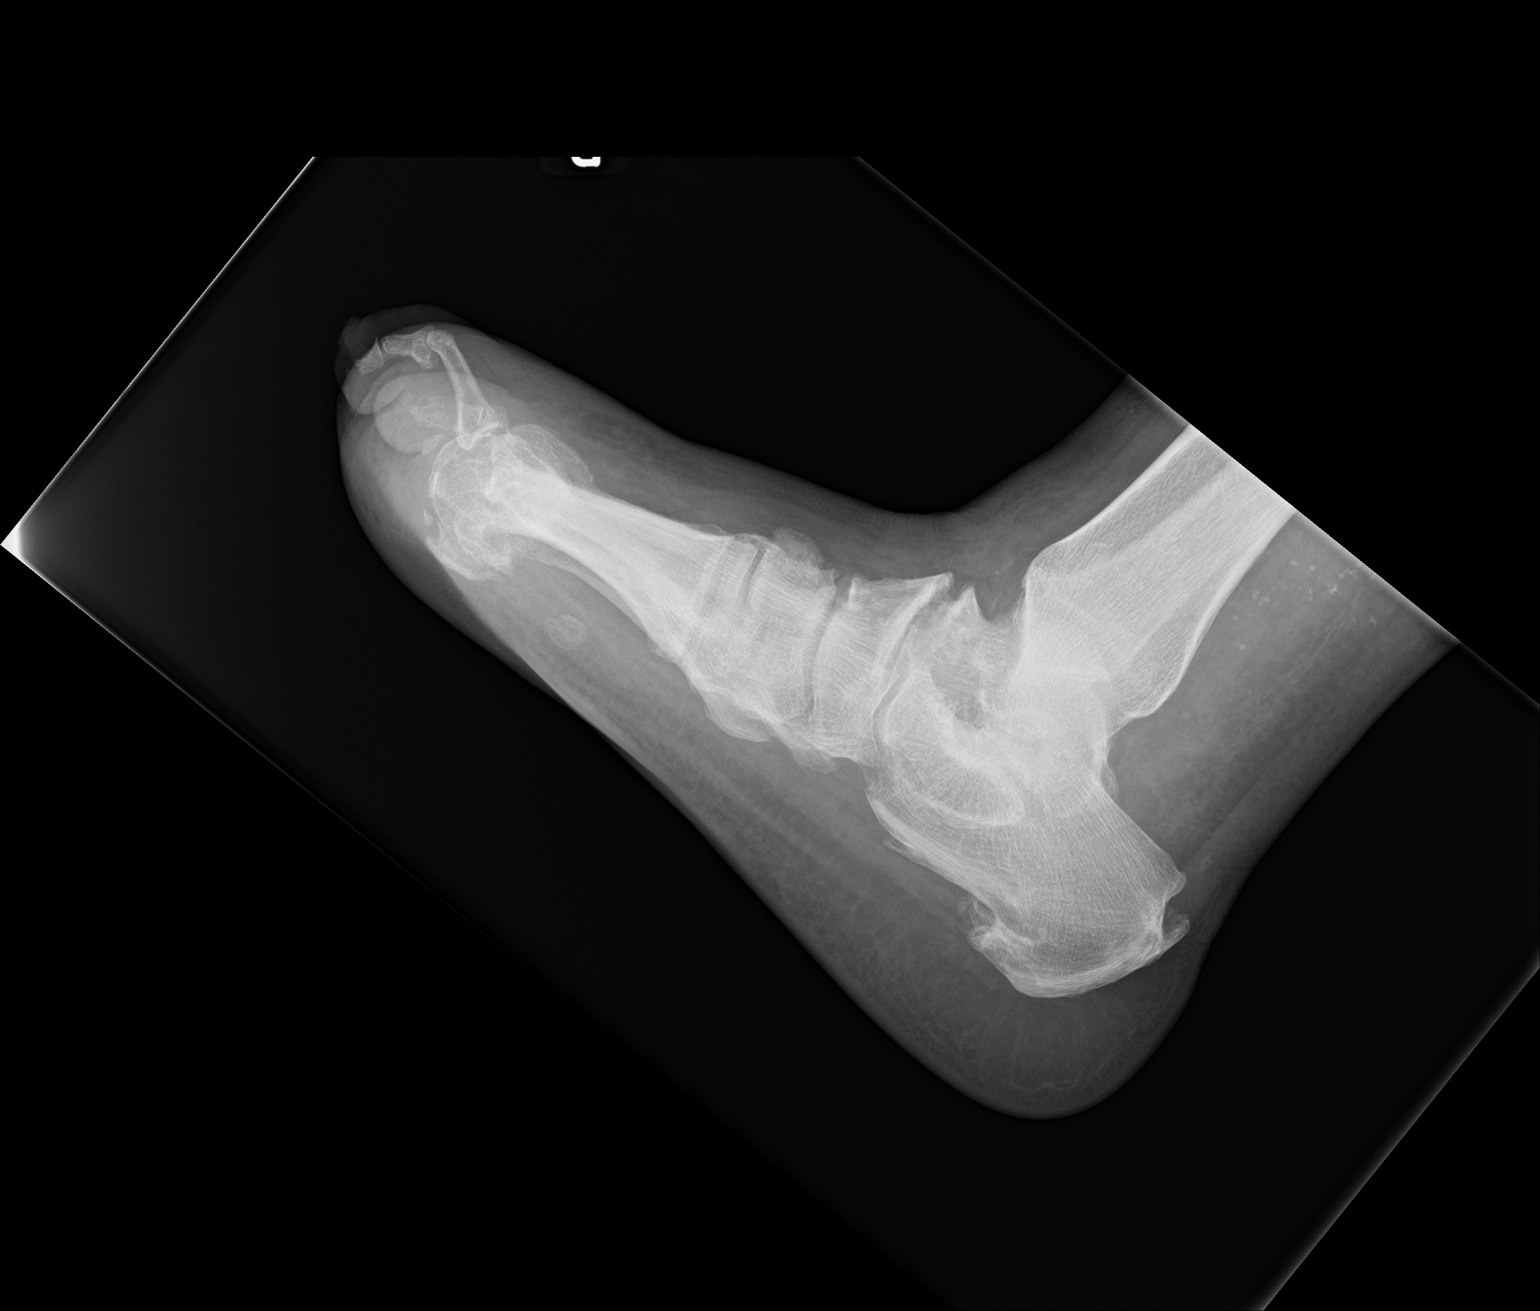

[3 of 3 positions shown; findings below may reference images not displayed]

FINDINGS: Prior amputation of the 1st through 3rd toes at the MTP joints. No
acute bony abnormality. No fracture, subluxation or dislocation. No
radiographic changes of osteomyelitis. No soft tissue gas.
Degenerative changes in the hindfoot and midfoot.
IMPRESSION: No acute bony abnormality. No radiographic evidence of acute
osteomyelitis.

## 2021-01-26 ENCOUNTER — Ambulatory Visit (HOSPITAL_COMMUNITY): Payer: Medicare PPO | Attending: Cardiology

## 2021-01-26 ENCOUNTER — Encounter (HOSPITAL_COMMUNITY): Payer: Self-pay | Admitting: Cardiology

## 2021-01-26 ENCOUNTER — Encounter (HOSPITAL_COMMUNITY): Payer: Self-pay

## 2021-01-26 NOTE — Progress Notes (Signed)
Verified appointment "no show" status with L. Walters at 12:58.

## 2021-01-27 DIAGNOSIS — I342 Nonrheumatic mitral (valve) stenosis: Secondary | ICD-10-CM | POA: Insufficient documentation

## 2021-01-27 NOTE — Progress Notes (Signed)
Cardiology Office Note   Date:  01/28/2021   ID:  Jason [name], DOB [birth year], MRN [id number]  PCP:  [name], MD  Cardiologist:   Minus Breeding, MD   Chief Complaint  Patient presents with   Shortness of Breath      History of Present Illness: Jason [name] is a 80 y.o. male who I saw in 2017.  He had MVP and moderate MR and MS. at the last visit he was noted to have an EKG consistent with flutter waves.  I had him wear a monitor and he does appear to be in flutter 100% of the time but with controlled rate.  He has been on subtherapeutic dose of Xarelto and I change this to a therapeutic dose at the last appointment and stop his aspirin.  He does not notice his flutter.  He does have any presyncope or syncope.  He walks around the grocery store and goes out to dinner with his wife.  He denies any chest pressure, neck or arm discomfort.  He has had no new presyncope or syncope.  He has chronic dyspnea that was evaluated with a stress test late last year and there was no evidence of ischemia.  This is unchanged.  He is not describing PND or orthopnea.  He had sugars not well controlled and is not following a diet.  He has had lower extremity transmetatarsal amputation.  This is on the right foot   Past Medical History:  Diagnosis Date   Anxiety    Asthma    in past, no inhaler now   Benign neoplasm of colon    Bone infection ([place])    Cancer ([place])    bladder cancer   COPD (chronic obstructive pulmonary disease) (HCC)    Depression    Diverticulosis of colon (without mention of hemorrhage)    DJD (degenerative joint disease)    Esophageal reflux    Glaucoma    Headache    Hidradenitis    History of BPH    History of gynecomastia    Unilateral   Hyperlipidemia    Irritable bowel syndrome    Memory loss    Mitral stenosis    mild-moderate MS 01/2016   MVP (mitral valve prolapse)    Obesity, unspecified    Pneumonia    Sleep apnea     does not use  cpap or bipap .. no study  ever   Type II or unspecified type diabetes mellitus without mention of complication, not stated as uncontrolled    Unspecified venous (peripheral) insufficiency     Past Surgical History:  Procedure Laterality Date   ACHILLES TENDON SURGERY Right 05/17/2019   Procedure: ACHILLES LENGTHENING/KIDNER;  Surgeon: Evelina Bucy, DPM;  Location: [facility];  Service: Podiatry;  Laterality: Right;   AMPUTATION  08/09/2012   Procedure: AMPUTATION DIGIT;  Surgeon: Newt Minion, MD;  Location: [place];  Service: Orthopedics;  Laterality: Right;  Amputation right Great Toe MTP Joint   AMPUTATION  10/17/2012   Procedure: AMPUTATION DIGIT;  Surgeon: Newt Minion, MD;  Location: [place];  Service: Orthopedics;  Laterality: Right;  Right Foot 3rd Toe Amputation at Metatarsophalangeal Joint   AMPUTATION TOE Right 05/17/2019   Procedure: AMPUTATION TOE Metatarsal Phalangeal Joint   FOURTH AND FIFTH, FILLETED TOE FLAP;  Surgeon: Evelina Bucy, DPM;  Location: [place];  Service: Podiatry;  Laterality: Right;   CHOLECYSTECTOMY N/A 06/14/2018   Procedure: LAPAROSCOPIC CHOLECYSTECTOMY  WITH INTRAOPERATIVE CHOLANGIOGRAM;  Surgeon: Kinsinger, Arta Bruce, MD;  Location: [facility];  Service: General;  Laterality: N/A;   CIRCUMCISION  06/2100   For Phimosis - Dr Hartley Barefoot   COLON SURGERY     CYSTOSCOPY W/ RETROGRADES Bilateral 07/07/2018   Procedure: CYSTOSCOPY WITH BILATERAL RETROGRADE PYELOGRAM;  Surgeon: Ardis Hughs, MD;  Location: [facility];  Service: Urology;  Laterality: Bilateral;   KNEE ARTHROSCOPY     LAPAROSCOPIC LYSIS OF ADHESIONS  06/14/2018   Procedure: LAPAROSCOPIC LYSIS OF ADHESIONS;  Surgeon: Kieth Brightly Arta Bruce, MD;  Location: [facility];  Service: General;;   LAPAROTOMY  07/20/11   LASER ABLATION  06/2009   Left Greater Saphenous vein -Dr Elicia Lamp OSTEOTOMY Right 05/17/2019   Procedure: METATARSLSECTOMY FOURTH DIGIT;  Surgeon: Evelina Bucy, DPM;   Location: [place] [facility];  Service: Podiatry;  Laterality: Right;   MULTIPLE TOOTH EXTRACTIONS     TOE AMPUTATION  06/2009   x3Right foot second toe -Dr Beola Cord   TONSILLECTOMY     TRANSURETHRAL RESECTION OF BLADDER TUMOR N/A 07/07/2018   Procedure: TRANSURETHRAL RESECTION OF BLADDER TUMOR (TURBT) WITH POST OP INSTILLATION OF GEMCITABIN;  Surgeon: Ardis Hughs, MD;  Location: [facility];  Service: Urology;  Laterality: N/A;   WOUND DEBRIDEMENT Right 07/25/2019   Procedure: Right foot wound debridement with application of skin graft substitute;  Surgeon: Evelina Bucy, DPM;  Location: [facility];  Service: Podiatry;  Laterality: Right;     Current Outpatient Medications  Medication Sig Dispense Refill   blood glucose meter kit and supplies KIT Use to check blood sugar daily. DX: E11. 1 each 0   Continuous Blood Gluc Receiver (FREESTYLE LIBRE 14 DAY READER) DEVI 1 Act by Does not apply route daily. 2 each 5   Continuous Blood Gluc Sensor (FREESTYLE LIBRE 14 DAY SENSOR) MISC 1 Act by Does not apply route daily. 2 each 5   Glucagon (GVOKE HYPOPEN 2-PACK) 1 MG/0.2ML SOAJ Inject 1 Act into the skin daily as needed. 2 mL 5   glucose blood (COOL BLOOD GLUCOSE TEST STRIPS) test strip Use to check blood sugar daily. DX: E11. 100 each 3   insulin glargine, 2 Unit Dial, (TOUJEO MAX SOLOSTAR) 300 UNIT/ML Solostar Pen Inject 30 Units into the skin daily. 9 mL 1   Insulin Pen Needle 32G X 6 MM MISC 1 Act by Does not apply route in the morning and at bedtime. 100 each 3   Lancets (ACCU-CHEK SOFT TOUCH) lancets Use to check blood sugar daily. DX: E11. 100 each 3   metFORMIN (GLUCOPHAGE) 500 MG tablet TAKE 2 TABLETS TWICE DAILY WITH MEALS 360 tablet 0   Multiple Vitamins-Minerals (MULTIVITAMIN WITH MINERALS) tablet Take 1 tablet by mouth daily.     rivaroxaban (XARELTO) 20 MG TABS tablet Take 1 tablet (20 mg total) by mouth daily with supper. 90 tablet 3   Semaglutide,0.25 or 0.5MG/DOS, (OZEMPIC, 0.25  OR 0.5 MG/DOSE,) 2 MG/1.5ML SOPN Inject 0.5 mg into the skin once a week. 9 mL 1   simvastatin (ZOCOR) 40 MG tablet TAKE 1 TABLET EVERY DAY  AT  6 PM 90 tablet 1   thiamine (VITAMIN B-1) 50 MG tablet Take 1 tablet (50 mg total) by mouth daily. 90 tablet 1   vitamin C (ASCORBIC ACID) 500 MG tablet Take 1,000 mg by mouth daily.     Wound Dressings (MEDIHONEY WOUND/BURN DRESSING) GEL Apply to affected are 3 times a week, and cover with sterile dressing. 15  mL 2   No current facility-administered medications for this visit.    Allergies:   Bactrim [sulfamethoxazole-trimethoprim]    ROS:  Please see the history of present illness.   Otherwise, review of systems are positive for none.   All other systems are reviewed and negative.    PHYSICAL EXAM: VS:  BP 128/60 (BP Location: Left Arm, Patient Position: Sitting, Cuff Size: Large)    Pulse 66    Ht '6\' 8"'  (2.032 m)    Wt 300 lb (136.1 kg)    BMI 32.96 kg/m  , BMI Body mass index is 32.96 kg/m. GENERAL:  Well appearing NECK:  No jugular venous distention, waveform within normal limits, carotid upstroke brisk and symmetric, no bruits, no thyromegaly LUNGS:  Clear to auscultation bilaterally CHEST:  Unremarkable HEART:  PMI not displaced or sustained,S1 and S2 within normal limits, no S3, no S4, no clicks, no rubs, 2 out of 6 systolic murmur heard at the apex nonradiating, I do not appreciate a diastolic murmur murmurs ABD:  Flat, positive bowel sounds normal in frequency in pitch, no bruits, no rebound, no guarding, no midline pulsatile mass, no hepatomegaly, no splenomegaly EXT:  2 plus pulses upper and decreased dorsalis pedis and posterior tibialis bilateral lower, nonpitting lower extremity edema, no cyanosis no clubbing status post amputation of the toes on the right side   EKG:  EKG is ordered today. Atrial flutter with 4-1 conduction rate 65, axis within normal limits, intervals within normal limits, no acute ST-T wave  changes.   Recent Labs: 05/21/2020: ALT 11 09/30/2020: Hemoglobin 13.1; Platelets 165.0; Pro B Natriuretic peptide (BNP) 117.0 01/06/2021: BUN 9; Creatinine, Ser 0.95; Potassium 4.5; Sodium 141    Lipid Panel    Component Value Date/Time   CHOL 147 05/21/2020 1458   TRIG 100 05/21/2020 1458   HDL 49 05/21/2020 1458   CHOLHDL 3.0 05/21/2020 1458   VLDL 16.0 04/18/2019 1035   LDLCALC 79 05/21/2020 1458      Wt Readings from Last 3 Encounters:  01/28/21 300 lb (136.1 kg)  01/06/21 298 lb (135.2 kg)  01/01/21 (!) 302 lb 3.2 oz (137.1 kg)      Other studies Reviewed: Additional studies/ records that were reviewed today include: None. Review of the above records demonstrates:  See below    ASSESSMENT AND PLAN:  Mitral stenosis:  Mod to severe MS.   I am going to repeat an echocardiogram to follow-up on this.  This seems to have progressed and needs close follow-up.    ATRIAL FLUTTER:  Look like he had flutter on his last EKG and again today and this was confirmed by his monitor which demonstrated flutter with variable conduction but predominantly controlled.  He does not notice this.  No change in therapy.  Continue current anticoagulation.    Hyperlipidemia: His LDL was 79 with an HDL of 49. No change in therapy.   DM2:   A1c was 8.2.  He is not at target and he understands this.  He has a lot of dietary indiscretion.  He wants to go see somebody to get some diet instructions but he declined to do this.  Continue current therapy and management per [name], MD.    Current medicines are reviewed at length with the patient today.  The patient does not have concerns regarding medicines.  The following changes have been made:  None  Labs/ tests ordered today include:    Orders Placed This Encounter  Procedures  EKG 12-Lead     Disposition:   FU with me in six months.   Signed, Minus Breeding, MD  01/28/2021 1:09 PM    [facility]

## 2021-01-28 ENCOUNTER — Ambulatory Visit (INDEPENDENT_AMBULATORY_CARE_PROVIDER_SITE_OTHER): Payer: Medicare PPO | Admitting: Cardiology

## 2021-01-28 ENCOUNTER — Other Ambulatory Visit: Payer: Self-pay

## 2021-01-28 ENCOUNTER — Encounter: Payer: Self-pay | Admitting: Cardiology

## 2021-01-28 ENCOUNTER — Telehealth: Payer: Self-pay | Admitting: Internal Medicine

## 2021-01-28 ENCOUNTER — Other Ambulatory Visit: Payer: Self-pay | Admitting: Internal Medicine

## 2021-01-28 VITALS — BP 128/60 | HR 66 | Ht >= 80 in | Wt 300.0 lb

## 2021-01-28 DIAGNOSIS — E785 Hyperlipidemia, unspecified: Secondary | ICD-10-CM

## 2021-01-28 DIAGNOSIS — I342 Nonrheumatic mitral (valve) stenosis: Secondary | ICD-10-CM | POA: Diagnosis not present

## 2021-01-28 DIAGNOSIS — E1151 Type 2 diabetes mellitus with diabetic peripheral angiopathy without gangrene: Secondary | ICD-10-CM

## 2021-01-28 DIAGNOSIS — I70209 Unspecified atherosclerosis of native arteries of extremities, unspecified extremity: Secondary | ICD-10-CM

## 2021-01-28 DIAGNOSIS — E118 Type 2 diabetes mellitus with unspecified complications: Secondary | ICD-10-CM | POA: Diagnosis not present

## 2021-01-28 DIAGNOSIS — E1165 Type 2 diabetes mellitus with hyperglycemia: Secondary | ICD-10-CM

## 2021-01-28 DIAGNOSIS — IMO0002 Reserved for concepts with insufficient information to code with codable children: Secondary | ICD-10-CM

## 2021-01-28 MED ORDER — TRUEPLUS SAFETY LANCETS 28G MISC: 1.0000 | Freq: Two times a day (BID) | 3 refills | Status: DC

## 2021-01-28 MED ORDER — SIMVASTATIN 40 MG PO TABS: 40.0000 mg | ORAL_TABLET | Freq: Every day | ORAL | 1 refills | Status: DC

## 2021-01-28 MED ORDER — OZEMPIC (0.25 OR 0.5 MG/DOSE) 2 MG/1.5ML ~~LOC~~ SOPN: 1.0000 mg | PEN_INJECTOR | SUBCUTANEOUS | 1 refills | Status: DC

## 2021-01-28 NOTE — Telephone Encounter (Signed)
Clarification given to pharmacist.

## 2021-01-28 NOTE — Telephone Encounter (Signed)
Yes, that is correct For blood sugar control

## 2021-01-28 NOTE — Patient Instructions (Signed)
Medication Instructions:  NO CHANGES *If you need a refill on your cardiac medications before your next appointment, please call your pharmacy*   Lab Work: NOT NEEDED If you have labs (blood work) drawn today and your tests are completely normal, you will receive your results only by: Marland Kitchen MyChart Message (if you have MyChart) OR . A paper copy in the mail If you have any lab test that is abnormal or we need to change your treatment, we will call you to review the results.   Testing/Procedures: WILL BE SCHEDULE AT [place] Your physician has requested that you have an echocardiogram. Echocardiography is a painless test that uses sound waves to create images of your heart. It provides your doctor with information about the size and shape of your heart and how well your heart's chambers and valves are working. This procedure takes approximately one hour. There are no restrictions for this procedure.     Follow-Up: At [facility], you and your health needs are our priority.  As part of our continuing mission to provide you with exceptional heart care, we have created designated Provider Care Teams.  These Care Teams include your primary Cardiologist (physician) and Advanced Practice Providers (APPs -  Physician Assistants and Nurse Practitioners) who all work together to provide you with the care you need, when you need it.  We recommend signing up for the patient portal called "MyChart".  Sign up information is provided on this After Visit Summary.  MyChart is used to connect with patients for Virtual Visits (Telemedicine).  Patients are able to view lab/test results, encounter notes, upcoming appointments, etc.  Non-urgent messages can be sent to your provider as well.   To learn more about what you can do with MyChart, go to [email or web address].    Your next appointment:   6 month(s)  The format for your next appointment:   In Person  Provider:   Minus Breeding, MD   Other Instructions

## 2021-01-28 NOTE — Telephone Encounter (Signed)
Pharmacy requesting clarification on  dosage of Semaglutide,0.25 or 0.5MG /DOS, (OZEMPIC, 0.25 OR 0.5 MG/DOSE,) 2 MG/1.5ML SOPN

## 2021-01-28 NOTE — Telephone Encounter (Signed)
It "Inject 1 mg into the skin once a week" per Epic correct for this medication?

## 2021-02-03 ENCOUNTER — Telehealth: Payer: Self-pay | Admitting: Pharmacist

## 2021-02-03 NOTE — Progress Notes (Signed)
Chronic Care Management Pharmacy Assistant   Name: Jason [name]  MRN: [id number] DOB: 12/24/1940   Reason for Encounter: Medication Coordination Call     Recent office visits:  01/06/21 Dr. Ronnald Ramp, no med changes  Recent consult visits:  01/28/21 Dr. Percival Spanish, no med [facility] visits:  None in previous 6 months  Medications: Outpatient Encounter Medications as of 02/03/2021  Medication Sig  . blood glucose meter kit and supplies KIT Use to check blood sugar daily. DX: E11.  . Continuous Blood Gluc Receiver (FREESTYLE LIBRE 14 DAY READER) DEVI 1 Act by Does not apply route daily.  . Continuous Blood Gluc Sensor (FREESTYLE LIBRE 14 DAY SENSOR) MISC 1 Act by Does not apply route daily.  . Glucagon (GVOKE HYPOPEN 2-PACK) 1 MG/0.2ML SOAJ Inject 1 Act into the skin daily as needed.  Marland Kitchen glucose blood (COOL BLOOD GLUCOSE TEST STRIPS) test strip Use to check blood sugar daily. DX: E11.  . insulin glargine, 2 Unit Dial, (TOUJEO MAX SOLOSTAR) 300 UNIT/ML Solostar Pen Inject 30 Units into the skin daily.  . Insulin Pen Needle 32G X 6 MM MISC 1 Act by Does not apply route in the morning and at bedtime.  . metFORMIN (GLUCOPHAGE) 500 MG tablet TAKE 2 TABLETS TWICE DAILY WITH MEALS  . Multiple Vitamins-Minerals (MULTIVITAMIN WITH MINERALS) tablet Take 1 tablet by mouth daily.  . rivaroxaban (XARELTO) 20 MG TABS tablet Take 1 tablet (20 mg total) by mouth daily with supper.  . Semaglutide,0.25 or 0.5MG/DOS, (OZEMPIC, 0.25 OR 0.5 MG/DOSE,) 2 MG/1.5ML SOPN Inject 1 mg into the skin once a week.  . simvastatin (ZOCOR) 40 MG tablet Take 1 tablet (40 mg total) by mouth daily at 6 PM.  . thiamine (VITAMIN B-1) 50 MG tablet Take 1 tablet (50 mg total) by mouth daily.  . TRUEplus Safety Lancets 28G MISC 1 Act by Does not apply route in the morning and at bedtime.  . vitamin C (ASCORBIC ACID) 500 MG tablet Take 1,000 mg by mouth daily.  . Wound Dressings (MEDIHONEY WOUND/BURN DRESSING) GEL Apply  to affected are 3 times a week, and cover with sterile dressing.   No facility-administered encounter medications on file as of 02/03/2021.    Care Gaps: Overdue for colonoscopy Overdue for Ophthalmonogy  Have you seen any other providers since your last visit?  The patient has seen Dr. Ronnald Ramp and Dr. Percival Spanish  Any changes in your medications or health?  The patient states that there has not been any changes in medication or health  Any side effects from any medications?  The patient states that he has not had any side effects from medications  Do you have an symptoms or problems not managed by your medications?  The patient states that he does not have any symptoms or problems not managed by medications  Any concerns about your health right now?  The patient states that he does not have any concerns about his health at this time,states he feels pretty good  Has your provider asked that you check blood pressure, blood sugar, or follow special diet at home? The patient states that he does not check blood pressure at home, but his blood sugar yesterday was 117. Patient does not follow any special diet.  Do you get any type of exercise on a regular basis?  The patient states that he does not exercise  Can you think of a goal you would like to reach for your health?  The patient  states that he does not have a goal about his health  Do you have any problems getting your medications?  The patient states that he does not have problems with getting medications but he does have an issue with the cost of some of his medications and some of them he states that he not been taking  Is there anything that you would like to discuss during the appointment?   The patient states that he does not have anything specific to discuss at this time  Patient is to receive a telephone call for appointment, asked to have medications and supplements at appointment   Wendy Poet, [place] [phone]   Time spent:30 minutes, Telephone call, reviewing chart notes, and documentation

## 2021-02-04 ENCOUNTER — Other Ambulatory Visit: Payer: Self-pay

## 2021-02-04 ENCOUNTER — Ambulatory Visit (INDEPENDENT_AMBULATORY_CARE_PROVIDER_SITE_OTHER): Payer: Medicare PPO | Admitting: Pharmacist

## 2021-02-04 DIAGNOSIS — E1151 Type 2 diabetes mellitus with diabetic peripheral angiopathy without gangrene: Secondary | ICD-10-CM | POA: Diagnosis not present

## 2021-02-04 DIAGNOSIS — E785 Hyperlipidemia, unspecified: Secondary | ICD-10-CM

## 2021-02-04 DIAGNOSIS — I739 Peripheral vascular disease, unspecified: Secondary | ICD-10-CM

## 2021-02-04 DIAGNOSIS — I70209 Unspecified atherosclerosis of native arteries of extremities, unspecified extremity: Secondary | ICD-10-CM

## 2021-02-04 DIAGNOSIS — I700 Atherosclerosis of aorta: Secondary | ICD-10-CM

## 2021-02-04 DIAGNOSIS — I4892 Unspecified atrial flutter: Secondary | ICD-10-CM

## 2021-02-04 NOTE — Progress Notes (Signed)
Chronic Care Management Pharmacy Note  02/05/2021 Name:  Jason [name] MRN:  [id number] DOB:  1941-03-26  Subjective: Jason [name] is an 80 y.o. year old male who is a primary patient of [name], MD.  The CCM team was consulted for assistance with disease management and care coordination needs.    Engaged with patient by telephone for initial visit in response to provider referral for pharmacy case management and/or care coordination services.   Consent to Services:  The patient was given the following information about Chronic Care Management services today, agreed to services, and gave verbal consent: 1. CCM service includes personalized support from designated clinical staff supervised by the primary care provider, including individualized plan of care and coordination with other care providers 2. 24/7 contact phone numbers for assistance for urgent and routine care needs. 3. Service will only be billed when office clinical staff spend 20 minutes or more in a month to coordinate care. 4. Only one practitioner may furnish and bill the service in a calendar month. 5.The patient may stop CCM services at any time (effective at the end of the month) by phone call to the office staff. 6. The patient will be responsible for cost sharing (co-pay) of up to 20% of the service fee (after annual deductible is met). Patient agreed to services and consent obtained.  Patient Care Team: [name], MD as PCP - General (Internal Medicine) Minus Breeding, MD as PCP - Cardiology (Cardiology) Charlton Haws, [facility] as Pharmacist (Pharmacist)  Recent office visits: 01/06/21 Dr Ronnald Ramp OV: DM f/u, A1c improved to 8.2%. No changes.  09/30/20 Dr Ronnald Ramp OV: DM f/u, started Toujeo and Gvoke.  Recent consult visits: 01/01/21 Dr Percival Spanish (cardiology): EKG c/w A flutter, has been on low-dose Xarelto. Changed to full dose 20 mg, and stop aspirin. Trouble with finger sticks?  11/03/20 RD Levie Heritage: Ozempic pen instruction  Hospital visits: None in previous 6 months  Objective:  Lab Results  Component Value Date   CREATININE 0.95 01/06/2021   BUN 9 01/06/2021   GFR 76.50 01/06/2021   GFRNONAA >60 05/17/2019   GFRAA >60 05/17/2019   NA 141 01/06/2021   K 4.5 01/06/2021   CALCIUM 9.7 01/06/2021   CO2 28 01/06/2021   GLUCOSE 182 (H) 01/06/2021    Lab Results  Component Value Date/Time   HGBA1C 8.2 (H) 01/06/2021 11:51 AM   HGBA1C 9.0 (H) 09/30/2020 12:10 PM   GFR 76.50 01/06/2021 11:51 AM   GFR 63.59 09/30/2020 12:10 PM   MICROALBUR 2.1 05/21/2020 02:58 PM   MICROALBUR 11.3 (H) 04/18/2019 10:43 AM    Last diabetic Eye exam:  Lab Results  Component Value Date/Time   HMDIABEYEEXA No Retinopathy 04/26/2019 12:00 AM    Last diabetic Foot exam:  Lab Results  Component Value Date/Time   HMDIABFOOTEX done 08/09/2014 12:00 AM     Lab Results  Component Value Date   CHOL 147 05/21/2020   HDL 49 05/21/2020   LDLCALC 79 05/21/2020   TRIG 100 05/21/2020   CHOLHDL 3.0 05/21/2020    Hepatic Function Latest Ref Rng & Units 05/21/2020 05/17/2019 04/18/2019  Total Protein 6.1 - 8.1 g/dL 6.7 6.5 7.1  Albumin 3.5 - 5.0 g/dL - 3.6 4.1  AST 10 - 35 U/L '12 19 12  ' ALT 9 - 46 U/L '11 18 13  ' Alk Phosphatase 38 - 126 U/L - 32(L) 43  Total Bilirubin 0.2 - 1.2 mg/dL 0.6 1.5(H) 0.9  Bilirubin, Direct 0.0 - 0.2 mg/dL 0.2 - -    Lab Results  Component Value Date/Time   TSH 0.85 04/18/2019 10:35 AM   TSH 1.66 06/12/2018 03:08 PM    CBC Latest Ref Rng & Units 09/30/2020 06/30/2020 05/21/2020  WBC 4.0 - 10.5 K/uL 4.1 4.6 4.9  Hemoglobin 13.0 - 17.0 g/dL 13.1 12.6(L) 12.4(L)  Hematocrit 39.0 - 52.0 % 39.1 38.3(L) 37.0(L)  Platelets 150.0 - 400.0 K/uL 165.0 159 146    No results found for: VD25OH  Clinical ASCVD: Yes  - PAD The 10-year ASCVD risk score Mikey Bussing DC Jr., et al., 2013) is: 39.1%   Values used to calculate the score:     Age: 85 years     Sex: Male     Is  Non-Hispanic African American: Yes     Diabetic: Yes     Tobacco smoker: Yes     Systolic Blood Pressure: 646 mmHg     Is BP treated: No     HDL Cholesterol: 49 mg/dL     Total Cholesterol: 147 mg/dL    Depression screen [facility] 2/9 10/20/2020 12/31/2019 11/05/2019  Decreased Interest 1 2 0  Down, Depressed, Hopeless 2 0 0  PHQ - 2 Score 3 2 0  Altered sleeping - 1 -  Tired, decreased energy - 1 -  Change in appetite - 1 -  Feeling bad or failure about yourself  - 0 -  Trouble concentrating - 1 -  Moving slowly or fidgety/restless - 1 -  Suicidal thoughts - 0 -  PHQ-9 Score - 7 -  Difficult doing work/chores - Not difficult at all -  Some recent data might be hidden    CHA2DS2-VASc Score = 5  The patient's score is based upon: CHF History: No HTN History: Yes Diabetes History: Yes Stroke History: No Vascular Disease History: Yes Age Score: 2 Gender Score: 0      Social History   Tobacco Use  Smoking Status Former Smoker  . Packs/day: 0.10  . Years: 5.00  . Pack years: 0.50  . Types: Cigarettes  . Quit date: 05/10/2015  . Years since quitting: 5.7  Smokeless Tobacco Never Used   BP Readings from Last 3 Encounters:  01/28/21 128/60  01/06/21 136/84  01/01/21 (!) 122/42   Pulse Readings from Last 3 Encounters:  01/28/21 66  01/06/21 64  01/01/21 64   Wt Readings from Last 3 Encounters:  01/28/21 300 lb (136.1 kg)  01/06/21 298 lb (135.2 kg)  01/01/21 (!) 302 lb 3.2 oz (137.1 kg)   BMI Readings from Last 3 Encounters:  01/28/21 32.96 kg/m  01/06/21 32.74 kg/m  01/01/21 33.20 kg/m    Assessment/Interventions: Review of patient past medical history, allergies, medications, health status, including review of consultants reports, laboratory and other test data, was performed as part of comprehensive evaluation and provision of chronic care management services.   SDOH:  (Social Determinants of Health) assessments and interventions performed: Yes SDOH  Interventions   Flowsheet Row Most Recent Value  SDOH Interventions   Financial Strain Interventions Other (Comment)  [Toujeo, Ozempic patient assistance. Xarelto - Engineer, maintenance when donut hole reached]     SDOH Screenings   Alcohol Screen: Not on file  Depression (PHQ2-9): Low Risk   . PHQ-2 Score: 3  Financial Resource Strain: High Risk  . Difficulty of Paying Living Expenses: Hard  Food Insecurity: Not on file  Housing: Not on file  Physical Activity: Not on file  Social  Connections: Not on file  Stress: Not on file  Tobacco Use: Medium Risk  . Smoking Tobacco Use: Former Smoker  . Smokeless Tobacco Use: Never Used  Transportation Needs: Not on file    CCM Care Plan  Allergies  Allergen Reactions  . Bactrim [Sulfamethoxazole-Trimethoprim] Rash    Medications Reviewed Today    Reviewed by Charlton Haws, [facility] (Pharmacist) on 02/05/21 at 1608  Med List Status: <None>  Medication Order Taking? Sig Documenting Provider Last Dose Status Informant  blood glucose meter kit and supplies KIT [id number] Yes Use to check blood sugar daily. DX: E11. Hoyt Koch, MD Taking Active   Continuous Blood Gluc Receiver (FREESTYLE LIBRE 14 DAY READER) [place] [id number] Yes 1 Act by Does not apply route daily. [name], MD Taking Active   Continuous Blood Gluc Sensor (FREESTYLE LIBRE [place]) [place] [id number] Yes 1 Act by Does not apply route daily. [name], MD Taking Active   Glucagon (GVOKE HYPOPEN 2-PACK) 1 MG/0.2ML Darden [name] [id number] Yes Inject 1 Act into the skin daily as needed. [name], MD Taking Active   glucose blood (COOL BLOOD GLUCOSE TEST STRIPS) test strip [id number] Yes Use to check blood sugar daily. DX: E11. Hoyt Koch, MD Taking Active   insulin glargine, 2 Unit Dial, (TOUJEO MAX SOLOSTAR) 300 UNIT/ML Solostar Pen [id number] Yes Inject 30 Units into the skin daily. [name], MD Taking Active   Insulin Pen Needle 32G X 6 MM MISC  [id number] Yes 1 Act by Does not apply route in the morning and at bedtime. [name], MD Taking Active   metFORMIN (GLUCOPHAGE) 500 MG tablet [id number] Yes TAKE 2 TABLETS TWICE DAILY WITH MEALS [name], MD Taking Active   Multiple Vitamins-Minerals (MULTIVITAMIN WITH MINERALS) tablet [id number] Yes Take 1 tablet by mouth daily. [provider] Taking Active Self  rivaroxaban (XARELTO) 20 MG TABS tablet [id number] No Take 1 tablet (20 mg total) by mouth daily with supper.  Patient not taking: Reported on 02/04/2021   Minus Breeding, MD Not Taking Active   Semaglutide,0.25 or 0.5MG/DOS, (OZEMPIC, 0.25 OR 0.5 MG/DOSE,) 2 MG/1.5ML SOPN [id number] No Inject 1 mg into the skin once a week.  Patient not taking: Reported on 02/04/2021   [name], MD Not Taking Active   simvastatin (ZOCOR) 40 MG tablet [id number] Yes Take 1 tablet (40 mg total) by mouth daily at 6 PM. [name], MD Taking Active   thiamine (VITAMIN B-1) 50 MG tablet [id number] Yes Take 1 tablet (50 mg total) by mouth daily. [name], MD Taking Active   TRUEplus Safety Lancets 28G [place] [id number] Yes 1 Act by Does not apply route in the morning and at bedtime. [name], MD Taking Active   vitamin C (ASCORBIC ACID) 500 MG tablet [id number] Yes Take 1,000 mg by mouth daily. [provider] Taking Active Self  Wound Dressings (MEDIHONEY WOUND/BURN DRESSING) GEL [id number] Yes Apply to affected are 3 times a week, and cover with sterile dressing. [name], MD Taking Active           Patient Active Problem List   Diagnosis Date Noted  . Nonrheumatic mitral valve stenosis 01/27/2021  . Atherosclerosis of aorta ([place]) 09/30/2020  . DOE (dyspnea on exertion) 09/30/2020  . Abnormal electrocardiogram (ECG) (EKG) 09/30/2020  . Thiamine deficiency 07/04/2020  . Ulcer of toe, right, with necrosis of bone ([place])   . Diabetic ulcer of toe  of right foot associated with type 2 diabetes mellitus,  with fat layer exposed ([place]) 04/18/2019  . Malignant neoplasm of urinary bladder ([place]) 07/11/2018  . Chronic idiopathic constipation 06/12/2018  . Diabetic infection of right foot ([place]) 07/12/2017  . Foot ulcer, right, with unspecified severity ([place]) 01/17/2017  . Complex sleep apnea syndrome 01/12/2016  . Diabetes mellitus type 2, uncontrolled, with complications ([place]) 32/99/2426  . OSA (obstructive sleep apnea) 12/18/2015  . Benign prostatic hyperplasia 02/18/2014  . Routine general medical examination at a health care facility 02/18/2014  . PAD (peripheral artery disease) ([place]) 02/18/2014  . DEGENERATIVE JOINT DISEASE 02/20/2008  . Diabetes mellitus type 2 with atherosclerosis of arteries of extremities ([place]) 01/26/2008  . Hyperlipidemia with target LDL less than 70 01/26/2008  . Morbid obesity ([place]) 01/26/2008  . Mitral valve disorder 01/26/2008  . GERD 01/26/2008  . Irritable bowel syndrome 01/26/2008    Immunization History  Administered Date(s) Administered  . Fluad Quad(high Dose 65+) 07/19/2019, 09/30/2020  . Influenza Split 08/30/2011, 08/22/2012, 09/10/2015  . Influenza Whole 10/24/2007, 08/26/2009  . Influenza, High Dose Seasonal PF 07/12/2017  . Influenza,inj,Quad PF,6+ Mos 08/20/2013, 08/09/2014, 08/23/2016  . Influenza-Unspecified 09/12/2015, 10/18/2018  . PFIZER(Purple Top)SARS-COV-2 Vaccination 12/04/2019, 01/03/2020, 08/28/2020  . Pneumococcal Conjugate-13 02/18/2014  . Pneumococcal Polysaccharide-23 08/06/2008, 09/13/2019  . Td 02/03/2009  . Tdap 09/13/2019  . Zoster 08/09/2014    Conditions to be addressed/monitored:  Hyperlipidemia, PAD, CAD, Diabetes and Atrial Flutter   Care Plan : [place]  Updates made by Charlton Haws, [place] since 02/05/2021 12:00 AM    Problem: Hyperlipidemia, PAD, CAD, Diabetes and Atrial Flutter   Priority: High    Long-Range Goal: Disease management   Start Date: 02/05/2021  Expected End Date: 08/08/2021   This Visit's Progress: On track  Priority: High  Note:   Current Barriers:  . Unable to independently afford treatment regimen . Unable to independently monitor therapeutic efficacy . Unable to achieve control of diabetes  . Unable to self administer medications as prescribed . Does not adhere to prescribed medication regimen . Does not contact provider office for questions/concerns  Pharmacist Clinical Goal(s):  Marland Kitchen Patient will verbalize ability to afford treatment regimen . achieve adherence to monitoring guidelines and medication adherence to achieve therapeutic efficacy . achieve control of diabetes as evidenced by improved A1c . achieve ability to self administer medications as prescribed through use of insulin pens as evidenced by patient report . adhere to prescribed medication regimen as evidenced by patient report . contact provider office for questions/concerns as evidenced notation of same in electronic health record through collaboration with PharmD and provider.   Interventions: . 1:1 collaboration with [name], MD regarding development and update of comprehensive plan of care as evidenced by provider attestation and co-signature . Inter-disciplinary care team collaboration (see longitudinal plan of care) . Comprehensive medication review performed; medication list updated in electronic medical record  Hyperlipidemia (LDL goal < 70) -Not ideally controlled - LDL is close to goal, but not < 70. Pt endorses compliance and denies side effects -hx PAD, aortic atherosclerosis -Current treatment: . Simvastatin 40 mg daily   -Medications previously tried: none  -Educated on Cholesterol goals;  Benefits of statin for ASCVD risk reduction; -Patient may benefit from switching to a higher intensity statin in the future given hx of PAD, aortic atherosclerosis. Will wait for results of next lipid panel before making changes.  Diabetes (A1c goal <8%) -Uncontrolled - A1c has  improved  but still above goal. Pt reports he has not been taking Ozempic due to not being sure if he should take it with Toujeo. -hx diabetic food infection, R toe amputations (all toes) -Current medications: Marland Kitchen Meformin 500 mg - 2 tab BID . Toujeo Max 30 units daily . Ozempic 1 mg once a week . Gvoke Hypopen . Freestyle Libre CGM -Medications previously tried:  Invokana, Catering manager, Farxiga, glimepiride -Current home glucose readings . fasting glucose: 117, 111, 107, 69 . post prandial glucose: not checking -Reports hypoglycemic symptoms - BG 69 he felt shaky, nauseous  -Current meal patterns: pt and wife will be starting Nutrisystem diet soon - getting small freezer to store meals -Current exercise: walks to mail box -Educated on A1c and blood sugar goals; Exercise goal of 150 minutes per week; Prevention and management of hypoglycemic episodes;  -Assessed patient finances - he will likely qualify for patient assistance for Toujeo and Ozempic. Will pursue. Pt will bring income documents to appt next week. -Scheduled in-person visit in 1 week to go over injection technique for Toujeo and Ozempic. Since patient never really started taking Ozempic, will plan to start at the beginning with 0.25 mg weekly. Also plan to reduce insulin 20% (to 24 units) to prevent hypoglycemia when starting GLP-1  Atrial Flutter (Goal: prevent stroke and major bleeding) -Dx 12/2020 from EKG. Pt is asymptomatic. -Uncontrolled - pt is not taking Xarelto because he could not afford the copay (>$250) -hx mitral valve stenosis -CHADSVASC: 5 -Current treatment: . Rate control: none . Anticoagulation: Xarelto 20 mg daily PM -Medications previously tried: none  -Counseled on increased risk of stroke due to Afib and benefits of anticoagulation for stroke prevention; importance of adherence to anticoagulant exactly as prescribed; -Assessed patient finances - it is likely that patient has a deductible to meet and that  is why Xarelto is so expensive. Per formulary it is a Tier 3 product so should be ~$47 per month once deductible is met. When patient reaches donut hole he can enroll in [place] to get medication for $85 per month (or $240 for 3 months). He will not qualify for Xarelto patient assistance until he spends 4% of income on Rx drugs in 2022. -Recommended to restart aspirin 81 mg until Xarelto access is obtained  Health Maintenance -Vaccine gaps: Shingrix -Current therapy:  . No vitamins -Recommended Shingrix vaccine  Patient Goals/Self-Care Activities . Patient will:  - take medications as prescribed focus on medication adherence by pill box check glucose twice a day, document, and provide at future appointments collaborate with provider on medication access solutions target a minimum of 150 minutes of moderate intensity exercise weekly  Follow Up Plan: Telephone follow up appointment with care management team member scheduled for: 1 week      Medication Assistance: Application for Toujeo and Ozempic  medication assistance program. in process.  Anticipated assistance start date 03/08/21.  See plan of care for additional detail.  Patient's preferred pharmacy is:  [facility] [phone] - [place], [place] - [phone] [place] [address] Phone: [phone] Fax: [phone]  [facility] - [address] [place] [address] Phone: [phone] Fax: [phone]  Uses pill box? No - keeps all meds in 1 container Pt endorses 90% compliance (not taking Ozempic or Xarelto)  We discussed: Current pharmacy is preferred with insurance plan and patient is satisfied with pharmacy services  Patient decided to: Continue current medication management strategy  Care Plan and Follow Up Patient Decision:  Patient agrees to Care Plan and Follow-up.  Plan:  Telephone follow up appointment with care management team member scheduled for:  1 week  Charlene Brooke, PharmD, Para [place], CPP Clinical Pharmacist [facility] at [facility] [phone]

## 2021-02-05 NOTE — Patient Instructions (Addendum)
Visit Information  Phone number for Pharmacist: [phone]  Thank you for meeting with me to discuss your medications! I look forward to working with you to achieve your health care goals. Below is a summary of what we talked about during the visit:  Goals Addressed            This Visit's Progress   . Manage My Medicine       Timeframe:  Long-Range Goal Priority:  High Start Date:      02/05/21                       Expected End Date:  05/08/21                     Follow Up Date 02/12/21   - call for medicine refill 2 or 3 days before it runs out - call if I am sick and can't take my medicine - keep a list of all the medicines I take; vitamins and herbals too - use a pillbox to sort medicine  - Let the doctors know when you cannot afford or you do not know how to take a medication   Why is this important?   . These steps will help you keep on track with your medicines.   Notes:     Marland Kitchen Monitor and Manage My Blood Sugar-Diabetes Type 2       Timeframe:  Long-Range Goal Priority:  High Start Date:    02/05/21                         Expected End Date:    05/08/21                   Follow Up Date 02/12/21   - check blood sugar at prescribed times (morning and bedtime) - check blood sugar if I feel it is too high or too low - enter blood sugar readings and medication or insulin into daily log - take the blood sugar log to all doctor visits  -Inject Toujeo 30 units at bedtime   Why is this important?    Checking your blood sugar at home helps to keep it from getting very high or very low.   Writing the results in a diary or log helps the doctor know how to care for you.   Your blood sugar log should have the time, date and the results.   Also, write down the amount of insulin or other medicine that you take.   Other information, like what you ate, exercise done and how you were feeling, will also be helpful.     Notes:       Patient Care Plan: CCM Pharmacy Care Plan     Problem Identified: Hyperlipidemia, PAD, CAD, Diabetes and Atrial Flutter   Priority: High    Long-Range Goal: Disease management   Start Date: 02/05/2021  Expected End Date: 08/08/2021  This Visit's Progress: On track  Priority: High  Note:   Current Barriers:  . Unable to independently afford treatment regimen . Unable to independently monitor therapeutic efficacy . Unable to achieve control of diabetes  . Unable to self administer medications as prescribed . Does not adhere to prescribed medication regimen . Does not contact provider office for questions/concerns  Pharmacist Clinical Goal(s):  Marland Kitchen Patient will verbalize ability to afford treatment regimen . achieve adherence to monitoring guidelines and medication adherence to  achieve therapeutic efficacy . achieve control of diabetes as evidenced by improved A1c . achieve ability to self administer medications as prescribed through use of insulin pens as evidenced by patient report . adhere to prescribed medication regimen as evidenced by patient report . contact provider office for questions/concerns as evidenced notation of same in electronic health record through collaboration with PharmD and provider.   Interventions: . 1:1 collaboration with [name], MD regarding development and update of comprehensive plan of care as evidenced by provider attestation and co-signature . Inter-disciplinary care team collaboration (see longitudinal plan of care) . Comprehensive medication review performed; medication list updated in electronic medical record  Hyperlipidemia (LDL goal < 70) -Not ideally controlled - LDL is close to goal, but not < 70. Pt endorses compliance and denies side effects -hx PAD, aortic atherosclerosis -Current treatment: . Simvastatin 40 mg daily   -Medications previously tried: none  -Educated on Cholesterol goals;  Benefits of statin for ASCVD risk reduction; -Patient may benefit from switching to a  higher intensity statin in the future given hx of PAD, aortic atherosclerosis. Will wait for results of next lipid panel before making changes.  Diabetes (A1c goal <8%) -Uncontrolled - A1c has improved but still above goal. Pt reports he has not been taking Ozempic due to not being sure if he should take it with Toujeo. -hx diabetic food infection, R toe amputations (all toes) -Current medications: Marland Kitchen Meformin 500 mg - 2 tab BID . Toujeo Max 30 units daily . Ozempic 1 mg once a week . Gvoke Hypopen . Freestyle Libre CGM -Medications previously tried:  Invokana, Catering manager, Farxiga, glimepiride -Current home glucose readings . fasting glucose: 117, 111, 107, 69 . post prandial glucose: not checking -Reports hypoglycemic symptoms - BG 69 he felt shaky, nauseous  -Current meal patterns: pt and wife will be starting Nutrisystem diet soon - getting small freezer to store meals -Current exercise: walks to mail box -Educated on A1c and blood sugar goals; Exercise goal of 150 minutes per week; Prevention and management of hypoglycemic episodes;  -Assessed patient finances - he will likely qualify for patient assistance for Toujeo and Ozempic. Will pursue. Pt will bring income documents to appt next week. -Scheduled in-person visit in 1 week to go over injection technique for Toujeo and Ozempic. Since patient never really started taking Ozempic, will plan to start at the beginning with 0.25 mg weekly. Also plan to reduce insulin 20% (to 24 units) to prevent hypoglycemia when starting GLP-1  Atrial Flutter (Goal: prevent stroke and major bleeding) -Dx 12/2020 from EKG. Pt is asymptomatic. -Uncontrolled - pt is not taking Xarelto because he could not afford the copay (>$250) -hx mitral valve stenosis -CHADSVASC: 5 -Current treatment: . Rate control: none . Anticoagulation: Xarelto 20 mg daily PM -Medications previously tried: none  -Counseled on increased risk of stroke due to Afib and benefits of  anticoagulation for stroke prevention; importance of adherence to anticoagulant exactly as prescribed; -Assessed patient finances - it is likely that patient has a deductible to meet and that is why Xarelto is so expensive. Per formulary it is a Tier 3 product so should be ~$47 per month once deductible is met. When patient reaches donut hole he can enroll in [place] to get medication for $85 per month (or $240 for 3 months). He will not qualify for Xarelto patient assistance until he spends 4% of income on Rx drugs in 2022. -Recommended to restart aspirin 81 mg until Xarelto access  is obtained  Health Maintenance -Vaccine gaps: Shingrix -Current therapy:  . No vitamins -Recommended Shingrix vaccine  Patient Goals/Self-Care Activities . Patient will:  - take medications as prescribed focus on medication adherence by pill box check glucose twice a day, document, and provide at future appointments collaborate with provider on medication access solutions target a minimum of 150 minutes of moderate intensity exercise weekly  Follow Up Plan: Telephone follow up appointment with care management team member scheduled for: 1 week      Jason [name] was given information about Chronic Care Management services today including:  1. CCM service includes personalized support from designated clinical staff supervised by his physician, including individualized plan of care and coordination with other care providers 2. 24/7 contact phone numbers for assistance for urgent and routine care needs. 3. Standard insurance, coinsurance, copays and deductibles apply for chronic care management only during months in which we provide at least 20 minutes of these services. Most insurances cover these services at 100%, however patients may be responsible for any copay, coinsurance and/or deductible if applicable. This service may help you avoid the need for more expensive face-to-face services. 4. Only one  practitioner may furnish and bill the service in a calendar month. 5. The patient may stop CCM services at any time (effective at the end of the month) by phone call to the office staff.  Patient agreed to services and verbal consent obtained.   The patient verbalized understanding of instructions, educational materials, and care plan provided today and agreed to receive a mailed copy of patient instructions, educational materials, and care plan.  Telephone follow up appointment with pharmacy team member scheduled for: 1 week  Charlene Brooke, PharmD, BCACP Clinical Pharmacist [facility] [phone]  Insulin Injection Instructions, Using Insulin Pens, Adult There are many different types of insulin. The type of insulin that you take may determine how many injections you give yourself and when you need to give the injections. Supplies needed:  Soap and water.  Your insulin pen.  A new needle.  Alcohol wipes.  A disposal container for sharp items (sharps container), such as an empty plastic bottle with a cover. How to choose a site for injection The body absorbs insulin differently, depending on where the insulin is injected (injection site). It is best to inject insulin into the same body area each time (for example, always in the abdomen), but you should use a different spot in that area for each injection. Do not inject the insulin in the same spot each time. There are five main areas that can be used for injecting. These areas are:  Abdomen. This is the preferred area.  Front of thigh.  Upper, outer side of thigh.  Upper, outer side of arm.  Upper, outer part of buttock.   How to use an insulin pen Get ready 1. Wash your hands with soap and water. If soap and water are not available, use hand sanitizer. 2. Test your blood sugar (glucose) level and write down that number. Follow any instructions from your health care provider about what to do if  your blood glucose level is higher or lower than your normal range. 3. Check the expiration date and the type of insulin that is in the pen. 4. If you are using CLEAR insulin, check to see that it is clear and free of clumps. 5. If you are using CLOUDY insulin, mix it by gently rolling the insulin pen between your palms  several times. Do not shake the pen. 6. Remove the cap from the insulin pen. 7. Use an alcohol wipe to clean the rubber tip of the pen. 8. Remove the protective paper tab from the disposable needle. Do not let the needle touch anything. 9. Screw a new, unused needle onto the pen. 10. Remove the outer plastic needle cover. Do not throw away the outer plastic cover yet. ? If the pen uses a special safety needle, leave the inner needle shield in place. ? If the pen does not use a special safety needle, remove the inner plastic cover from the needle. 11. Follow the manufacturer's instructions to prime the insulin pen with the volume of insulin needed. Hold the pen with the needle pointing up, and push the button on the opposite end of the pen until a drop of insulin appears at the needle tip. If no insulin appears, repeat this step. 12. Turn the button (dial) to the number of units of insulin that you will be injecting. Inject the insulin 1. Use an alcohol wipe to clean the site where you will be inserting the needle. Let the site air-dry. 2. Hold the pen in the palm of your writing hand like a pencil. 3. If directed by your health care provider, use your other hand to pinch and hold about 1 inch (2.5 cm) of skin at the injection site. Do not directly touch the cleaned part of the skin. 4. Gently but quickly, use your writing hand to put the needle straight into the skin. Insert the needle at a 45-degree angle or a 90-degree angle (perpendicular) to the skin, as directed by your health care provider. 5. When the needle is completely inserted into the skin, let go of the skin that you  are pinching. 6. Use your thumb or index finger of your writing hand to push the top button of the pen all the way to inject the insulin. Continue to hold the pen in place with your writing hand. 7. Wait 10 seconds, then pull the needle straight out of the skin. This will allow all of the insulin to go from the pen and needle into your body. 8. Carefully put the larger (outer) plastic cover of the needle back over the needle, then unscrew the capped needle and discard it in a sharps container, such as an empty plastic bottle with a cover. 9. Put the plastic cap back on the insulin pen.   How to throw away supplies  Discard all used needles in a sharps container.  Follow the disposal regulations for the area where you live. Do not use any needle more than one time.  Throw away empty disposable pens in the regular trash. Questions to ask your health care provider  How often should I be taking insulin?  How often should I check my blood glucose?  What amount of insulin should I be taking at each time?  What are the side effects?  What should I do if my blood glucose is too high?  What should I do if my blood glucose is too low?  What should I do if I forget to take my insulin?  What number should I call if I have questions? Where to find more information  American Diabetes Association (ADA): www.diabetes.org  Association of Diabetes Care and Education Specialists (ADCES): www.diabeteseducator.org Summary  Before you give yourself an insulin injection, be sure to wash your hands and test your blood sugar level. Write down that number.  Check the expiration date and the type of insulin that is in the pen. The type of insulin that you take may determine how many injections you give yourself and when you need to give the injections.  It is best to inject insulin into the same body area each time (for example, always in the abdomen), but you should use a different spot in that area  for each injection.  Do not use a needle more than one time. This information is not intended to replace advice given to you by your health care provider. Make sure you discuss any questions you have with your health care provider. Document Revised: 12/06/2019 Document Reviewed: 12/06/2019 Elsevier Patient Education  2021 [employer].

## 2021-02-12 ENCOUNTER — Ambulatory Visit: Payer: Medicare PPO

## 2021-02-12 ENCOUNTER — Ambulatory Visit: Payer: Medicare PPO | Admitting: Pharmacist

## 2021-02-12 DIAGNOSIS — E1151 Type 2 diabetes mellitus with diabetic peripheral angiopathy without gangrene: Secondary | ICD-10-CM

## 2021-02-12 DIAGNOSIS — I4892 Unspecified atrial flutter: Secondary | ICD-10-CM

## 2021-02-12 DIAGNOSIS — I70209 Unspecified atherosclerosis of native arteries of extremities, unspecified extremity: Secondary | ICD-10-CM | POA: Diagnosis not present

## 2021-02-12 DIAGNOSIS — E785 Hyperlipidemia, unspecified: Secondary | ICD-10-CM | POA: Diagnosis not present

## 2021-02-12 NOTE — Patient Instructions (Addendum)
Pharmacist phone number: [phone]  Thank you for meeting with me to discuss your medications! I look forward to working with you to achieve your health care goals. Below is a summary of what we talked about during the visit:   Sunday April 3rd: START taking OZEMPIC 0.25 mg once a week on SUNDAYS for 4 weeks  Sunday May 1st: INCREASE OZEMPIC to 0.5 mg on SUNDAYS  TODAY: DECREASE your TOUJEO Insulin to 20 units. Take every day at 8:00 AM with your other medications.  **store your extra Toujeo pens in the fridge until you are ready to use them. Once you open the pen it is good for 28 days out of the fridge.  Keep checking your blood sugar in the morning before breakfast. Our target is 80-130.  Pick up Xarelto 20 mg from [facility] (30-day free trial)

## 2021-02-12 NOTE — Progress Notes (Signed)
Chronic Care Management Pharmacy Note  02/12/2021 Name:  Jason [name] MRN:  [id number] DOB:  Mar 29, 1941  Subjective: Jason [name] is an 80 y.o. year old male who is a primary patient of [name], MD.  The CCM team was consulted for assistance with disease management and care coordination needs.    Engaged with patient face to face for follow up visit in response to provider referral for pharmacy case management and/or care coordination services.   Patient presents in person for Ozempic training.    Consent to Services:  The patient was given information about Chronic Care Management services, agreed to services, and gave verbal consent prior to initiation of services.  Please see initial visit note for detailed documentation.   Patient Care Team: [name], MD as PCP - General (Internal Medicine) Minus Breeding, MD as PCP - Cardiology (Cardiology) Charlton Haws, [facility] as Pharmacist (Pharmacist)  Recent office visits: 01/06/21 Dr Ronnald Ramp OV: DM f/u, A1c improved to 8.2%. No changes.  09/30/20 Dr Ronnald Ramp OV: DM f/u, started Toujeo and Gvoke.  Recent consult visits: 01/01/21 Dr Percival Spanish (cardiology): EKG c/w A flutter, has been on low-dose Xarelto. Changed to full dose 20 mg, and stop aspirin. Trouble with finger sticks?  11/03/20 RD Levie Heritage: Ozempic pen instruction  Hospital visits: None in previous 6 months  Objective:  Lab Results  Component Value Date   CREATININE 0.95 01/06/2021   BUN 9 01/06/2021   GFR 76.50 01/06/2021   GFRNONAA >60 05/17/2019   GFRAA >60 05/17/2019   NA 141 01/06/2021   K 4.5 01/06/2021   CALCIUM 9.7 01/06/2021   CO2 28 01/06/2021   GLUCOSE 182 (H) 01/06/2021    Lab Results  Component Value Date/Time   HGBA1C 8.2 (H) 01/06/2021 11:51 AM   HGBA1C 9.0 (H) 09/30/2020 12:10 PM   GFR 76.50 01/06/2021 11:51 AM   GFR 63.59 09/30/2020 12:10 PM   MICROALBUR 2.1 05/21/2020 02:58 PM   MICROALBUR 11.3 (H) 04/18/2019 10:43  AM    Last diabetic Eye exam:  Lab Results  Component Value Date/Time   HMDIABEYEEXA No Retinopathy 04/26/2019 12:00 AM    Last diabetic Foot exam:  Lab Results  Component Value Date/Time   HMDIABFOOTEX done 08/09/2014 12:00 AM     Lab Results  Component Value Date   CHOL 147 05/21/2020   HDL 49 05/21/2020   LDLCALC 79 05/21/2020   TRIG 100 05/21/2020   CHOLHDL 3.0 05/21/2020    Hepatic Function Latest Ref Rng & Units 05/21/2020 05/17/2019 04/18/2019  Total Protein 6.1 - 8.1 g/dL 6.7 6.5 7.1  Albumin 3.5 - 5.0 g/dL - 3.6 4.1  AST 10 - 35 U/L _0 ALT 9 - 46 U/L _1 Alk Phosphatase 38 - 126 U/L - 32(L) 43  Total Bilirubin 0.2 - 1.2 mg/dL 0.6 1.5(H) 0.9  Bilirubin, Direct 0.0 - 0.2 mg/dL 0.2 - -    Lab Results  Component Value Date/Time   TSH 0.85 04/18/2019 10:35 AM   TSH 1.66 06/12/2018 03:08 PM    CBC Latest Ref Rng & Units 09/30/2020 06/30/2020 05/21/2020  WBC 4.0 - 10.5 K/uL 4.1 4.6 4.9  Hemoglobin 13.0 - 17.0 g/dL 13.1 12.6(L) 12.4(L)  Hematocrit 39.0 - 52.0 % 39.1 38.3(L) 37.0(L)  Platelets 150.0 - 400.0 K/uL 165.0 159 146    No results found for: VD25OH  Clinical ASCVD: Yes  - PAD The 10-year ASCVD risk score Mikey Bussing DC Brooke Bonito., et  al., 2013) is: 39.1%   Values used to calculate the score:     Age: 58 years     Sex: Male     Is Non-Hispanic African American: Yes     Diabetic: Yes     Tobacco smoker: Yes     Systolic Blood Pressure: 998 mmHg     Is BP treated: No     HDL Cholesterol: 49 mg/dL     Total Cholesterol: 147 mg/dL    Depression screen [facility] 2/9 10/20/2020 12/31/2019 11/05/2019  Decreased Interest 1 2 0  Down, Depressed, Hopeless 2 0 0  PHQ - 2 Score 3 2 0  Altered sleeping - 1 -  Tired, decreased energy - 1 -  Change in appetite - 1 -  Feeling bad or failure about yourself  - 0 -  Trouble concentrating - 1 -  Moving slowly or fidgety/restless - 1 -  Suicidal thoughts - 0 -  PHQ-9 Score - 7 -  Difficult doing work/chores - Not difficult  at all -  Some recent data might be hidden    CHA2DS2-VASc Score = 5  The patient's score is based upon: CHF History: No HTN History: Yes Diabetes History: Yes Stroke History: No Vascular Disease History: Yes Age Score: 2 Gender Score: 0      Social History   Tobacco Use  Smoking Status Former Smoker  . Packs/day: 0.10  . Years: 5.00  . Pack years: 0.50  . Types: Cigarettes  . Quit date: 05/10/2015  . Years since quitting: 5.7  Smokeless Tobacco Never Used   BP Readings from Last 3 Encounters:  01/28/21 128/60  01/06/21 136/84  01/01/21 (!) 122/42   Pulse Readings from Last 3 Encounters:  01/28/21 66  01/06/21 64  01/01/21 64   Wt Readings from Last 3 Encounters:  01/28/21 300 lb (136.1 kg)  01/06/21 298 lb (135.2 kg)  01/01/21 (!) 302 lb 3.2 oz (137.1 kg)   BMI Readings from Last 3 Encounters:  01/28/21 32.96 kg/m  01/06/21 32.74 kg/m  01/01/21 33.20 kg/m    Assessment/Interventions: Review of patient past medical history, allergies, medications, health status, including review of consultants reports, laboratory and other test data, was performed as part of comprehensive evaluation and provision of chronic care management services.   SDOH:  (Social Determinants of Health) assessments and interventions performed: Yes  SDOH Screenings   Alcohol Screen: Not on file  Depression (PHQ2-9): Low Risk   . PHQ-2 Score: 3  Financial Resource Strain: High Risk  . Difficulty of Paying Living Expenses: Hard  Food Insecurity: Not on file  Housing: Not on file  Physical Activity: Not on file  Social Connections: Not on file  Stress: Not on file  Tobacco Use: Medium Risk  . Smoking Tobacco Use: Former Smoker  . Smokeless Tobacco Use: Never Used  Transportation Needs: Not on file    CCM Care Plan  Allergies  Allergen Reactions  . Bactrim [Sulfamethoxazole-Trimethoprim] Rash    Medications Reviewed Today    Reviewed by Charlton Haws, [facility]  (Pharmacist) on 02/05/21 at 1608  Med List Status: <None>  Medication Order Taking? Sig Documenting Provider Last Dose Status Informant  blood glucose meter kit and supplies KIT [id number] Yes Use to check blood sugar daily. DX: E11. Hoyt Koch, MD Taking Active   Continuous Blood Gluc Receiver (FREESTYLE LIBRE 14 DAY READER) [place] [id number] Yes 1 Act by Does not apply route daily. [name], MD Taking Active  Continuous Blood Gluc Sensor (FREESTYLE LIBRE 14 DAY SENSOR) [place] [id number] Yes 1 Act by Does not apply route daily. [name], MD Taking Active   Glucagon (GVOKE HYPOPEN 2-PACK) 1 MG/0.2ML Darden [name] [id number] Yes Inject 1 Act into the skin daily as needed. [name], MD Taking Active   glucose blood (COOL BLOOD GLUCOSE TEST STRIPS) test strip [id number] Yes Use to check blood sugar daily. DX: E11. Hoyt Koch, MD Taking Active   insulin glargine, 2 Unit Dial, (TOUJEO MAX SOLOSTAR) 300 UNIT/ML Solostar Pen [id number] Yes Inject 30 Units into the skin daily. [name], MD Taking Active   Insulin Pen Needle 32G X 6 MM MISC [id number] Yes 1 Act by Does not apply route in the morning and at bedtime. [name], MD Taking Active   metFORMIN (GLUCOPHAGE) 500 MG tablet [id number] Yes TAKE 2 TABLETS TWICE DAILY WITH MEALS [name], MD Taking Active   Multiple Vitamins-Minerals (MULTIVITAMIN WITH MINERALS) tablet [id number] Yes Take 1 tablet by mouth daily. [provider] Taking Active Self  rivaroxaban (XARELTO) 20 MG TABS tablet [id number] No Take 1 tablet (20 mg total) by mouth daily with supper.  Patient not taking: Reported on 02/04/2021   Minus Breeding, MD Not Taking Active   Semaglutide,0.25 or 0.5MG/DOS, (OZEMPIC, 0.25 OR 0.5 MG/DOSE,) 2 MG/1.5ML SOPN [id number] No Inject 1 mg into the skin once a week.  Patient not taking: Reported on 02/04/2021   [name], MD Not Taking Active   simvastatin (ZOCOR) 40 MG tablet [id number] Yes Take 1  tablet (40 mg total) by mouth daily at 6 PM. [name], MD Taking Active   thiamine (VITAMIN B-1) 50 MG tablet [id number] Yes Take 1 tablet (50 mg total) by mouth daily. [name], MD Taking Active   TRUEplus Safety Lancets 28G [place] [id number] Yes 1 Act by Does not apply route in the morning and at bedtime. [name], MD Taking Active   vitamin C (ASCORBIC ACID) 500 MG tablet [id number] Yes Take 1,000 mg by mouth daily. [provider] Taking Active Self  Wound Dressings (MEDIHONEY WOUND/BURN DRESSING) GEL [id number] Yes Apply to affected are 3 times a week, and cover with sterile dressing. [name], MD Taking Active           Patient Active Problem List   Diagnosis Date Noted  . Nonrheumatic mitral valve stenosis 01/27/2021  . Atherosclerosis of aorta ([place]) 09/30/2020  . DOE (dyspnea on exertion) 09/30/2020  . Abnormal electrocardiogram (ECG) (EKG) 09/30/2020  . Thiamine deficiency 07/04/2020  . Ulcer of toe, right, with necrosis of bone ([place])   . Diabetic ulcer of toe of right foot associated with type 2 diabetes mellitus, with fat layer exposed ([place]) 04/18/2019  . Malignant neoplasm of urinary bladder ([place]) 07/11/2018  . Chronic idiopathic constipation 06/12/2018  . Diabetic infection of right foot ([place]) 07/12/2017  . Foot ulcer, right, with unspecified severity ([place]) 01/17/2017  . Complex sleep apnea syndrome 01/12/2016  . Diabetes mellitus type 2, uncontrolled, with complications ([place]) 06/17/2335  . OSA (obstructive sleep apnea) 12/18/2015  . Benign prostatic hyperplasia 02/18/2014  . Routine general medical examination at a health care facility 02/18/2014  . PAD (peripheral artery disease) ([place]) 02/18/2014  . DEGENERATIVE JOINT DISEASE 02/20/2008  . Diabetes mellitus type 2 with atherosclerosis of arteries of extremities ([place]) 01/26/2008  . Hyperlipidemia with target LDL less than 70 01/26/2008  . Morbid obesity ([place]) 01/26/2008  . Mitral valve  disorder 01/26/2008  . GERD 01/26/2008  . Irritable bowel syndrome 01/26/2008    Immunization History  Administered Date(s) Administered  . Fluad Quad(high Dose 65+) 07/19/2019, 09/30/2020  . Influenza Split 08/30/2011, 08/22/2012, 09/10/2015  . Influenza Whole 10/24/2007, 08/26/2009  . Influenza, High Dose Seasonal PF 07/12/2017  . Influenza,inj,Quad PF,6+ Mos 08/20/2013, 08/09/2014, 08/23/2016  . Influenza-Unspecified 09/12/2015, 10/18/2018  . PFIZER(Purple Top)SARS-COV-2 Vaccination 12/04/2019, 01/03/2020, 08/28/2020  . Pneumococcal Conjugate-13 02/18/2014  . Pneumococcal Polysaccharide-23 08/06/2008, 09/13/2019  . Td 02/03/2009  . Tdap 09/13/2019  . Zoster 08/09/2014    Conditions to be addressed/monitored:  Diabetes and Atrial Flutter   Diabetes (A1c goal <8%) -Uncontrolled - demonstrated Ozempic injection technique. Pt has not been taking Ozempic but has been taking Toujeo, although not at consistent time of day. He reports he sometimes wakes up at 2-3am and takes his insulin if he checks his BG and it is "high" -hx diabetic food infection, R toe amputations (all toes) -Current medications: Marland Kitchen Meformin 500 mg - 2 tab BID . Toujeo Max 30 units daily . Ozempic 1 mg once a week . Gvoke Hypopen . Freestyle Libre CGM -Medications previously tried:  Invokana, Catering manager, Farxiga, glimepiride -Current home glucose readings . fasting glucose: 117, 111, 107, 69 . post prandial glucose: not checking -Reports hypoglycemic symptoms - BG 69 he felt shaky, nauseous  -Current meal patterns: pt and wife will be starting Nutrisystem diet soon - getting small freezer to store meals -Current exercise: walks to mail box -Assessed patient finances -  Patient signed Nelva Nay and Ozempic patient assistance applications today. He will provide proof of income as soon as he can. -Reduce Toujeo to 20 units in anticipation of starting Ozempic. Counseled to take Toujeo consistently at 8am with other AM  meds. Advised not to take at 2-3am -Start Ozempic back at 0.25 mg weekly x 4 weeks then increase to 0.5 mg weekly (Sundays)  Atrial Flutter (Goal: prevent stroke and major bleeding) -Dx 12/2020 from EKG. Pt is asymptomatic. -Uncontrolled - pt is not taking Xarelto because he could not afford the copay (>$250) -hx mitral valve stenosis -CHADSVASC: 5 -Current treatment: . Rate control: none . Anticoagulation: Xarelto 20 mg daily PM -Medications previously tried: none  -Counseled on increased risk of stroke due to Afib and benefits of anticoagulation for stroke prevention; importance of adherence to anticoagulant exactly as prescribed; -Assessed patient finances -contacted [employer] to discuss Xarelto copay. Representative states copay will be $131 for 90 day supply, she reports patient had a deductible last time which was why it was over $300, deductible has since been met. -Provided patient with Xarelto free trial card for 30 day supply at [facility]. Thereafter it will be $131 for 90 ds through mail order until donut hole. Can pursue [employer] for $85/mo in donut hole.  Medication Assistance: Application for Toujeo and Ozempic  medication assistance program. in process.  Anticipated assistance start date 03/08/21.  See plan of care for additional detail.  Patient's preferred pharmacy is:  [phone] - [place], [place] - [phone] AT [place] [address] Phone: [phone] Fax: [phone]  [facility] - [address], [place] [address] Phone: [phone] Fax: [phone]  Uses pill box? No - keeps all meds in 1 container Pt endorses 90% compliance (not taking Ozempic or Xarelto)  We discussed: Current pharmacy is preferred with insurance plan and patient is  satisfied with pharmacy services Patient decided to: Continue current medication  management strategy  Care Plan and Follow Up Patient Decision:  Patient agrees to Care Plan and Follow-up.  Plan: Telephone follow up appointment with care management team member scheduled for:  1 week  Charlene Brooke, PharmD, Para [place], CPP Clinical Pharmacist [facility] [phone]

## 2021-02-19 ENCOUNTER — Other Ambulatory Visit: Payer: Self-pay | Admitting: Internal Medicine

## 2021-02-19 DIAGNOSIS — E1151 Type 2 diabetes mellitus with diabetic peripheral angiopathy without gangrene: Secondary | ICD-10-CM

## 2021-02-19 DIAGNOSIS — IMO0002 Reserved for concepts with insufficient information to code with codable children: Secondary | ICD-10-CM

## 2021-02-19 DIAGNOSIS — E1165 Type 2 diabetes mellitus with hyperglycemia: Secondary | ICD-10-CM

## 2021-02-25 ENCOUNTER — Ambulatory Visit (HOSPITAL_COMMUNITY): Payer: Medicare PPO | Attending: Cardiology

## 2021-02-25 ENCOUNTER — Telehealth (HOSPITAL_COMMUNITY): Payer: Self-pay | Admitting: Cardiology

## 2021-02-25 NOTE — Telephone Encounter (Signed)
Just an FYI. We have made several attempts to contact this patient including sending a letter to schedule or reschedule their echocardiogram. We will be removing the patient from the echo [place].  01/26/21 NO SHOWED- Mailed letter [facility]    Thank you

## 2021-02-26 ENCOUNTER — Telehealth: Payer: Self-pay

## 2021-02-26 NOTE — Telephone Encounter (Signed)
Notified patient that Jason [name] is ready for pick-up whenever he is ready. Patient verbalizes understanding.

## 2021-03-04 ENCOUNTER — Telehealth: Payer: Self-pay | Admitting: Pharmacist

## 2021-03-04 NOTE — Progress Notes (Signed)
Chronic Care Management Pharmacy Assistant   Name: Jason [name]  MRN: [id number] DOB: 30-Jun-1941   Reason for Encounter: Diabetic Disease State Call   Conditions to be addressed/monitored: DMII   Recent office visits:  None ID  Recent consult visits:  None ID  Hospital visits:  None in previous 6 months  Medications: Outpatient Encounter Medications as of 03/04/2021  Medication Sig  . blood glucose meter kit and supplies KIT Use to check blood sugar daily. DX: E11.  . Continuous Blood Gluc Receiver (FREESTYLE LIBRE 14 DAY READER) [place] 1 Act by Does not apply route daily.  . Continuous Blood Gluc Sensor (FREESTYLE LIBRE 14 DAY SENSOR) MISC 1 Act by Does not apply route daily.  . Glucagon (GVOKE HYPOPEN 2-PACK) 1 MG/0.2ML SO[place] Inject 1 Act into the skin daily as needed.  Marland Kitchen glucose blood (COOL BLOOD GLUCOSE TEST STRIPS) test strip Use to check blood sugar daily. DX: E11.  . insulin glargine, 2 Unit Dial, (TOUJEO MAX SOLOSTAR) 300 UNIT/ML Solostar Pen Inject 30 Units into the skin daily.  . Insulin Pen Needle 32G X 6 MM MISC 1 Act by Does not apply route in the morning and at bedtime.  . metFORMIN (GLUCOPHAGE) 500 MG tablet TAKE 2 TABLETS TWICE DAILY WITH MEALS  . Multiple Vitamins-Minerals (MULTIVITAMIN WITH MINERALS) tablet Take 1 tablet by mouth daily.  . rivaroxaban (XARELTO) 20 MG TABS tablet Take 1 tablet (20 mg total) by mouth daily with supper. (Patient not taking: Reported on 02/04/2021)  . Semaglutide,0.25 or 0.5MG/DOS, (OZEMPIC, 0.25 OR 0.5 MG/DOSE,) 2 MG/1.5ML SOPN Inject 1 mg into the skin once a week. (Patient not taking: Reported on 02/04/2021)  . simvastatin (ZOCOR) 40 MG tablet Take 1 tablet (40 mg total) by mouth daily at 6 PM.  . thiamine (VITAMIN B-1) 50 MG tablet Take 1 tablet (50 mg total) by mouth daily.  . TRUEplus Safety Lancets 28G MISC 1 Act by Does not apply route in the morning and at bedtime.  . vitamin C (ASCORBIC ACID) 500 MG tablet Take 1,000 mg  by mouth daily.  . Wound Dressings (MEDIHONEY WOUND/BURN DRESSING) GEL Apply to affected are 3 times a week, and cover with sterile dressing.   No facility-administered encounter medications on file as of 03/04/2021.    Recent Relevant Labs: Lab Results  Component Value Date/Time   HGBA1C 8.2 (H) 01/06/2021 11:51 AM   HGBA1C 9.0 (H) 09/30/2020 12:10 PM   MICROALBUR 2.1 05/21/2020 02:58 PM   MICROALBUR 11.3 (H) 04/18/2019 10:43 AM    Kidney Function Lab Results  Component Value Date/Time   CREATININE 0.95 01/06/2021 11:51 AM   CREATININE 1.11 09/30/2020 12:10 PM   CREATININE 1.00 05/21/2020 02:58 PM   GFR 76.50 01/06/2021 11:51 AM   GFRNONAA >60 05/17/2019 06:29 AM   GFRAA >60 05/17/2019 06:29 AM     Current antihyperglycemic regimen:   Meformin 500 mg - 2 tab BID  Toujeo Max 30 units daily  Ozempic 1 mg once a week  Gvoke Hypopen  Freestyle Libre CGM  . What recent interventions/DTPs have been made to improve glycemic control: Patient to continue current medications  . Have there been any recent hospitalizations or ED visits since last visit with CPP? Patient has not had any visits to the hospital or ED  . Patient denies hypoglycemic symptoms . Patient denies hyperglycemic symptoms  . How often are you checking your blood sugar? Patient states that she does not check blood sugar every day,  and he does not have a particular time when he checks it  . What are your blood sugars ranging?  o Fasting:  o Before meals:  o After meals: Patient states after he ate his blood sugar was 126 in the am o Bedtime:   . During the week, how often does your blood glucose drop below 70? Patient states blood sugar has not been below 70  . Are you checking your feet daily/regularly? Patient states that he is not having any issues with his feet  Adherence Review: Is the patient currently on a STATIN medication? Yes, Simvastatin Is the patient currently on ACE/ARB medication?  No Does the patient have >5 day gap between last estimated fill dates? No   Star Rating Drugs: Simvastatin 01/28/21 90 ds  [place] Pharmacist Assistant [phone]  Time spent:25

## 2021-03-06 ENCOUNTER — Telehealth: Payer: Self-pay | Admitting: Pharmacist

## 2021-03-06 NOTE — Progress Notes (Signed)
Spoke with patient this morning to verify how he was taking Toujeo and Ozempic. Patient stated that he was told that he can cut back on the [facility], so he said that he does 20 units daily. Patient stated for the Ozempic he was not sure the last time he had taken it. He thinks it could have been 03/02/21 or 02/23/21. I asked the patient if he had any of the Ozempic left to take and he stated that he has to sample packs in the refrigerator left. I told the patient that the clinical pharmacist Mendel Ryder needs proof of household income so that she can complete the patient assistance form. The patient states that his wife would handle that and get the paper work to the office as soon as possible.  Wendy Poet, CPA [facility]  Time spent:23

## 2021-03-10 DIAGNOSIS — C672 Malignant neoplasm of lateral wall of bladder: Secondary | ICD-10-CM | POA: Diagnosis not present

## 2021-03-11 NOTE — Progress Notes (Signed)
Opened in error

## 2021-03-31 ENCOUNTER — Other Ambulatory Visit: Payer: Self-pay

## 2021-03-31 ENCOUNTER — Ambulatory Visit: Payer: Medicare PPO | Admitting: Podiatry

## 2021-03-31 ENCOUNTER — Ambulatory Visit (INDEPENDENT_AMBULATORY_CARE_PROVIDER_SITE_OTHER): Payer: Medicare PPO

## 2021-03-31 DIAGNOSIS — E11621 Type 2 diabetes mellitus with foot ulcer: Secondary | ICD-10-CM

## 2021-03-31 DIAGNOSIS — S90821A Blister (nonthermal), right foot, initial encounter: Secondary | ICD-10-CM

## 2021-03-31 DIAGNOSIS — L97411 Non-pressure chronic ulcer of right heel and midfoot limited to breakdown of skin: Secondary | ICD-10-CM | POA: Diagnosis not present

## 2021-03-31 DIAGNOSIS — M79671 Pain in right foot: Secondary | ICD-10-CM | POA: Diagnosis not present

## 2021-03-31 NOTE — Progress Notes (Signed)
  Subjective:  Patient ID: Jason [name], male    DOB: 11/16/1940,  MRN: [id number]  Chief Complaint  Patient presents with  . Blister     diabetic with black blister-right foot    80 y.o. male presents for wound care. Hx confirmed with patient. Unsure when this started. Stated he had a callus first on the bottom of his foot. Denies pain. At first was soft but is now firm. Objective:  Physical Exam: Wound Location: right submet 1 Wound Measurement: 0.5x0.5 post-debridement Wound Base: Granular/Healthy Peri-wound: Calloused, blistered Exudate: moderate amount dried hemorrage wound without warmth, erythema, signs of acute infection  No images are attached to the encounter.  Radiographs:  X-ray of the right foot: no soft tissue emphysema, no signs of osteomyelitis; s/p amputation of all toes; HO central metatarsals Assessment:   1. Blister of plantar aspect of right foot, initial encounter   2. Diabetic ulcer of right midfoot associated with type 2 diabetes mellitus, limited to breakdown of skin ([place])    Plan:  Patient was evaluated and treated and all questions answered.  Ulcer righ 1st met with blister -Debrided of excess skin. Cleansed and dried hemorrhagic tissue removed. -Dressed with povidone and gauze, tape -Discussed the importance of wearing patient's toe filler. States he only wears it occasionally.  Procedure: Selective Debridement of Wound Rationale: Removal of devitalized tissue from the wound to promote healing.  Pre-Debridement Wound Measurements: overlying blister and HPK Post-Debridement Wound Measurements: 0.5x0.5 submet 1 ulcer with surrounding healed skin Type of Debridement: sharp selective Instrumentation: dermal curette, tissue nipper Tissue Removed: Devitalized soft-tissue Dressing: Dry, sterile, compression dressing. Disposition: Patient tolerated procedure well.    Return in about 1 month (around 05/01/2021) for Ulcer check.

## 2021-04-22 ENCOUNTER — Other Ambulatory Visit: Payer: Self-pay | Admitting: Internal Medicine

## 2021-04-22 DIAGNOSIS — E1151 Type 2 diabetes mellitus with diabetic peripheral angiopathy without gangrene: Secondary | ICD-10-CM

## 2021-04-22 DIAGNOSIS — IMO0002 Reserved for concepts with insufficient information to code with codable children: Secondary | ICD-10-CM

## 2021-04-28 ENCOUNTER — Telehealth: Payer: Self-pay | Admitting: Internal Medicine

## 2021-04-28 ENCOUNTER — Other Ambulatory Visit: Payer: Self-pay | Admitting: Internal Medicine

## 2021-04-28 DIAGNOSIS — E1151 Type 2 diabetes mellitus with diabetic peripheral angiopathy without gangrene: Secondary | ICD-10-CM

## 2021-04-28 MED ORDER — COOL BLOOD GLUCOSE TEST STRIPS VI STRP: ORAL_STRIP | 3 refills | Status: DC

## 2021-04-28 NOTE — Telephone Encounter (Signed)
1.Medication Requested: glucose blood (COOL BLOOD GLUCOSE TEST STRIPS) test strip   2. Pharmacy (Name, Street, [place]): [facility] [phone] - [place], [place] - [address] AT [place]  3. On Med List: yes   4. Last Visit with PCP: 01-06-21  5. Next visit date with PCP: 05-07-21   Agent: Please be advised that RX refills may take up to 3 business days. We ask that you follow-up with your pharmacy.

## 2021-04-29 LAB — HM DIABETES EYE EXAM

## 2021-04-30 ENCOUNTER — Telehealth: Payer: Self-pay | Admitting: *Deleted

## 2021-04-30 DIAGNOSIS — H25013 Cortical age-related cataract, bilateral: Secondary | ICD-10-CM | POA: Diagnosis not present

## 2021-04-30 DIAGNOSIS — Z794 Long term (current) use of insulin: Secondary | ICD-10-CM | POA: Diagnosis not present

## 2021-04-30 DIAGNOSIS — H2513 Age-related nuclear cataract, bilateral: Secondary | ICD-10-CM | POA: Diagnosis not present

## 2021-04-30 DIAGNOSIS — E119 Type 2 diabetes mellitus without complications: Secondary | ICD-10-CM | POA: Diagnosis not present

## 2021-04-30 NOTE — Telephone Encounter (Signed)
Patient is calling for his appointment time and date.   Returned call, no answer, left vmessage of appointment and time.

## 2021-05-01 ENCOUNTER — Ambulatory Visit: Payer: Medicare PPO | Admitting: Podiatry

## 2021-05-07 ENCOUNTER — Encounter: Payer: Self-pay | Admitting: Internal Medicine

## 2021-05-07 ENCOUNTER — Ambulatory Visit (INDEPENDENT_AMBULATORY_CARE_PROVIDER_SITE_OTHER): Payer: Medicare PPO | Admitting: Internal Medicine

## 2021-05-07 ENCOUNTER — Other Ambulatory Visit: Payer: Self-pay

## 2021-05-07 VITALS — BP 128/62 | HR 77 | Temp 98.5°F | Ht >= 80 in | Wt 295.0 lb

## 2021-05-07 DIAGNOSIS — E519 Thiamine deficiency, unspecified: Secondary | ICD-10-CM

## 2021-05-07 DIAGNOSIS — Z794 Long term (current) use of insulin: Secondary | ICD-10-CM | POA: Diagnosis not present

## 2021-05-07 DIAGNOSIS — I70209 Unspecified atherosclerosis of native arteries of extremities, unspecified extremity: Secondary | ICD-10-CM | POA: Diagnosis not present

## 2021-05-07 DIAGNOSIS — E118 Type 2 diabetes mellitus with unspecified complications: Secondary | ICD-10-CM

## 2021-05-07 DIAGNOSIS — E1165 Type 2 diabetes mellitus with hyperglycemia: Secondary | ICD-10-CM

## 2021-05-07 DIAGNOSIS — IMO0002 Reserved for concepts with insufficient information to code with codable children: Secondary | ICD-10-CM

## 2021-05-07 DIAGNOSIS — E119 Type 2 diabetes mellitus without complications: Secondary | ICD-10-CM | POA: Diagnosis not present

## 2021-05-07 DIAGNOSIS — E1151 Type 2 diabetes mellitus with diabetic peripheral angiopathy without gangrene: Secondary | ICD-10-CM | POA: Diagnosis not present

## 2021-05-07 DIAGNOSIS — I4892 Unspecified atrial flutter: Secondary | ICD-10-CM | POA: Diagnosis not present

## 2021-05-07 DIAGNOSIS — K5904 Chronic idiopathic constipation: Secondary | ICD-10-CM

## 2021-05-07 DIAGNOSIS — I700 Atherosclerosis of aorta: Secondary | ICD-10-CM

## 2021-05-07 LAB — HEMOGLOBIN A1C: Hgb A1c MFr Bld: 9.3 % — ABNORMAL HIGH (ref 4.6–6.5)

## 2021-05-07 LAB — BASIC METABOLIC PANEL
BUN: 13 mg/dL (ref 6–23)
CO2: 27 mEq/L (ref 19–32)
Calcium: 9.3 mg/dL (ref 8.4–10.5)
Chloride: 102 mEq/L (ref 96–112)
Creatinine, Ser: 1.21 mg/dL (ref 0.40–1.50)
GFR: 57.09 mL/min — ABNORMAL LOW (ref >60.00)
Glucose, Bld: 359 mg/dL — ABNORMAL HIGH (ref 70–99)
Potassium: 4.6 mEq/L (ref 3.5–5.1)
Sodium: 136 mEq/L (ref 135–145)

## 2021-05-07 LAB — TSH: TSH: 0.78 u[IU]/mL (ref 0.35–4.50)

## 2021-05-07 MED ORDER — FREESTYLE LIBRE 2 READER DEVI: 1.0000 | Freq: Every day | 5 refills | Status: DC

## 2021-05-07 MED ORDER — FREESTYLE LIBRE 2 SENSOR MISC: 1.0000 | Freq: Every day | 5 refills | Status: DC

## 2021-05-07 MED ORDER — TOUJEO MAX SOLOSTAR 300 UNIT/ML ~~LOC~~ SOPN: 30.0000 [IU] | PEN_INJECTOR | Freq: Every day | SUBCUTANEOUS | 1 refills | Status: DC

## 2021-05-07 NOTE — Progress Notes (Signed)
Subjective:  Patient ID: Jason [name], male    DOB: Sep 03, 1941  Age: 80 y.o. MRN: [id number]  CC: Diabetes  This visit occurred during the SARS-CoV-2 public health emergency.  Safety protocols were in place, including screening questions prior to the visit, additional usage of staff PPE, and extensive cleaning of exam room while observing appropriate contact time as indicated for disinfecting solutions.    HPI Jason [name] presents for f/up -   He complains of polyuria.  He has not been monitoring his blood sugars.  He does not think he has recently been using insulin.  He tells me he thinks he is compliant with metformin and the GLP-1 agonist.  Outpatient Medications Prior to Visit  Medication Sig Dispense Refill   blood glucose meter kit and supplies KIT Use to check blood sugar daily. DX: E11. 1 each 0   Glucagon (GVOKE HYPOPEN 2-PACK) 1 MG/0.2ML SOAJ Inject 1 Act into the skin daily as needed. 2 mL 5   glucose blood (COOL BLOOD GLUCOSE TEST STRIPS) test strip Use to check blood sugar daily. DX: E11. 100 each 3   Insulin Pen Needle 32G X 6 MM MISC 1 Act by Does not apply route in the morning and at bedtime. 100 each 3   metFORMIN (GLUCOPHAGE) 500 MG tablet TAKE 2 TABLETS TWICE DAILY WITH MEALS 360 tablet 0   Multiple Vitamins-Minerals (MULTIVITAMIN WITH MINERALS) tablet Take 1 tablet by mouth daily.     rivaroxaban (XARELTO) 20 MG TABS tablet Take 1 tablet (20 mg total) by mouth daily with supper. 90 tablet 3   Semaglutide,0.25 or 0.5MG/DOS, (OZEMPIC, 0.25 OR 0.5 MG/DOSE,) 2 MG/1.5ML SOPN Inject 1 mg into the skin once a week. 9 mL 1   simvastatin (ZOCOR) 40 MG tablet Take 1 tablet (40 mg total) by mouth daily at 6 PM. 90 tablet 1   thiamine (VITAMIN B-1) 50 MG tablet Take 1 tablet (50 mg total) by mouth daily. 90 tablet 1   TRUEplus Safety Lancets 28G MISC 1 Act by Does not apply route in the morning and at bedtime. 200 each 3   vitamin C (ASCORBIC ACID) 500 MG tablet Take 1,000  mg by mouth daily.     Wound Dressings (MEDIHONEY WOUND/BURN DRESSING) GEL Apply to affected are 3 times a week, and cover with sterile dressing. 15 mL 2   Continuous Blood Gluc Receiver (FREESTYLE LIBRE 14 DAY READER) DEVI 1 Act by Does not apply route daily. 2 each 5   Continuous Blood Gluc Sensor (FREESTYLE LIBRE 14 DAY SENSOR) MISC 1 Act by Does not apply route daily. 2 each 5   insulin glargine, 2 Unit Dial, (TOUJEO MAX SOLOSTAR) 300 UNIT/ML Solostar Pen Inject 30 Units into the skin daily. 9 mL 1   No facility-administered medications prior to visit.    ROS Review of Systems  Constitutional:  Negative for diaphoresis, fatigue and unexpected weight change.  HENT: Negative.    Eyes: Negative.   Respiratory: Negative.  Negative for cough, chest tightness, shortness of breath and wheezing.   Cardiovascular:  Negative for chest pain, palpitations and leg swelling.  Gastrointestinal:  Negative for abdominal pain, diarrhea, nausea and vomiting.  Endocrine: Positive for polyuria. Negative for polydipsia and polyphagia.  Genitourinary: Negative.  Negative for difficulty urinating and dysuria.  Musculoskeletal: Negative.  Negative for myalgias.  Skin: Negative.   Neurological: Negative.  Negative for dizziness and weakness.  Hematological:  Negative for adenopathy. Does not bruise/bleed easily.  Psychiatric/Behavioral: Negative.     Objective:  BP 128/62 (BP Location: Right Arm, Patient Position: Sitting, Cuff Size: Large)   Pulse 77   Temp 98.5 F (36.9 C) (Oral)   Ht '6\' 8"'  (2.032 m)   Wt 295 lb (133.8 kg)   SpO2 95%   BMI 32.41 kg/m   BP Readings from Last 3 Encounters:  05/07/21 128/62  01/28/21 128/60  01/06/21 136/84    Wt Readings from Last 3 Encounters:  05/07/21 295 lb (133.8 kg)  01/28/21 300 lb (136.1 kg)  01/06/21 298 lb (135.2 kg)    Physical Exam Vitals reviewed.  HENT:     Nose: Nose normal.     Mouth/Throat:     Mouth: Mucous membranes are moist.   Eyes:     Conjunctiva/sclera: Conjunctivae normal.  Cardiovascular:     Rate and Rhythm: Normal rate and regular rhythm.     Heart sounds: Murmur heard.  Systolic murmur is present with a grade of 1/6.    No gallop.  Pulmonary:     Effort: Pulmonary effort is normal.     Breath sounds: No stridor. No wheezing, rhonchi or rales.  Abdominal:     General: Abdomen is protuberant. Bowel sounds are normal. There is no distension.     Palpations: Abdomen is soft. There is no hepatomegaly, splenomegaly or mass.  Musculoskeletal:     Cervical back: Neck supple.     Right lower leg: No edema.     Left lower leg: No edema.  Skin:    General: Skin is warm and dry.  Neurological:     General: No focal deficit present.  Psychiatric:        Mood and Affect: Mood normal.        Behavior: Behavior normal.    Lab Results  Component Value Date   WBC 4.1 09/30/2020   HGB 13.1 09/30/2020   HCT 39.1 09/30/2020   PLT 165.0 09/30/2020   GLUCOSE 359 (H) 05/07/2021   CHOL 147 05/21/2020   TRIG 100 05/21/2020   HDL 49 05/21/2020   LDLCALC 79 05/21/2020   ALT 11 05/21/2020   AST 12 05/21/2020   NA 136 05/07/2021   K 4.6 05/07/2021   CL 102 05/07/2021   CREATININE 1.21 05/07/2021   BUN 13 05/07/2021   CO2 27 05/07/2021   TSH 0.78 05/07/2021   PSA 1.13 04/18/2019   INR 1.05 10/17/2012   HGBA1C 9.3 (H) 05/07/2021   MICROALBUR 2.1 05/21/2020    VAS [place] ABI WITH/WO TBI  Result Date: 07/08/2020 LOWER EXTREMITY DOPPLER STUDY Indications: Peripheral artery disease, and right great toe amputation. High Risk Factors: Hyperlipidemia, Diabetes.  Comparison Study: 05/01/19 Performing Technologist: June Leap RDMS, RVT  Examination Guidelines: A complete evaluation includes at minimum, Doppler waveform signals and systolic blood pressure reading at the level of bilateral brachial, anterior tibial, and posterior tibial arteries, when vessel segments are accessible. Bilateral testing is considered an  integral part of a complete examination. Photoelectric Plethysmograph (PPG) waveforms and toe systolic pressure readings are included as required and additional duplex testing as needed. Limited examinations for reoccurring indications may be performed as noted.  ABI Findings: +--------+------------------+-----+---------+--------+ Right   Rt Pressure (mmHg)IndexWaveform Comment  +--------+------------------+-----+---------+--------+ [id number]                                      +--------+------------------+-----+---------+--------+ ATA  168               1.24 triphasic         +--------+------------------+-----+---------+--------+ PTA     123               0.90 triphasic         +--------+------------------+-----+---------+--------+ +---------+------------------+-----+---------+-------+ Left     Lt Pressure (mmHg)IndexWaveform Comment +---------+------------------+-----+---------+-------+ ATA      177               1.30 triphasic        +---------+------------------+-----+---------+-------+ PTA      163               1.20 triphasic        +---------+------------------+-----+---------+-------+ Great Toe145               1.07 Normal           +---------+------------------+-----+---------+-------+ +-------+-----------+-----------+------------+------------+ ABI/TBIToday's ABIToday's TBIPrevious ABIPrevious TBI +-------+-----------+-----------+------------+------------+ Right  1.24                  1.2                      +-------+-----------+-----------+------------+------------+ Left   1.3                   [place]                       +-------+-----------+-----------+------------+------------+ Arterial wall calcification precludes accurate ankle pressures and ABIs.  Summary: Right: Resting right ankle-brachial index is within normal range. No evidence of significant right lower extremity arterial disease. Left: Resting left ankle-brachial index is  within normal range. No evidence of significant left lower extremity arterial disease.  *See table(s) above for measurements and observations.  Electronically signed by Curt Jews MD on 07/08/2020 at 3:57:28 PM.   Final     Assessment & Plan:   Robert was seen today for diabetes.  Diagnoses and all orders for this visit:  Atherosclerosis of aorta ([place])- Risk factor modifications addressed.  Diabetes mellitus type 2 with atherosclerosis of arteries of extremities (HCC) -     Continuous Blood Gluc Sensor (FREESTYLE LIBRE 2 SENSOR) MISC; 1 Act by Does not apply route daily. -     Continuous Blood Gluc Receiver (FREESTYLE LIBRE 2 READER) DEVI; 1 Act by Does not apply route daily. -     Basic metabolic panel; Future -     Hemoglobin A1c; Future -     Hemoglobin A1c -     Basic metabolic panel -     insulin glargine, 2 Unit Dial, (TOUJEO MAX SOLOSTAR) 300 UNIT/ML Solostar Pen; Inject 30 Units into the skin daily.  Thiamine deficiency  Diabetes mellitus type 2, uncontrolled, with complications (HCC) -     Continuous Blood Gluc Sensor (FREESTYLE LIBRE 2 SENSOR) MISC; 1 Act by Does not apply route daily. -     Continuous Blood Gluc Receiver (FREESTYLE LIBRE 2 READER) DEVI; 1 Act by Does not apply route daily. -     Basic metabolic panel; Future -     Hemoglobin A1c; Future -     Hemoglobin A1c -     Basic metabolic panel -     insulin glargine, 2 Unit Dial, (TOUJEO MAX SOLOSTAR) 300 UNIT/ML Solostar Pen; Inject 30 Units into the skin daily.  Chronic idiopathic constipation- Labs to evaluate for secondary causes are unremarkable. -     Basic  metabolic panel; Future -     TSH; Future -     TSH -     Basic metabolic panel  Atrial flutter, unspecified type ([place])- He has good rate and rhythm control. -     TSH; Future -     TSH  Insulin-requiring or dependent type II diabetes mellitus ([place])- His blood sugars are too high.  I have asked him to restart the basal insulin. -     insulin  glargine, 2 Unit Dial, (TOUJEO MAX SOLOSTAR) 300 UNIT/ML Solostar Pen; Inject 30 Units into the skin daily. -     Consult to [place] Management  I have discontinued Erling Conte. [employer] FreeStyle Libre 14 Day Sensor and [employer] 14 Day Reader. I am also having him start on FreeStyle Libre 2 Sensor and [employer] 2 Reader. Additionally, I am having him maintain his vitamin C, multivitamin with minerals, Medihoney Wound/Burn Dressing, blood glucose meter kit and supplies, thiamine, Gvoke HypoPen 2-Pack, Insulin Pen Needle, rivaroxaban, simvastatin, Ozempic (0.25 or 0.5 MG/DOSE), TRUEplus Safety Lancets 28G, metFORMIN, Cool Blood Glucose Test Strips, and Toujeo Max SoloStar.  Meds ordered this encounter  Medications   Continuous Blood Gluc Sensor (FREESTYLE LIBRE 2 SENSOR) MISC    Sig: 1 Act by Does not apply route daily.    Dispense:  2 each    Refill:  5   Continuous Blood Gluc Receiver (FREESTYLE LIBRE 2 READER) DEVI    Sig: 1 Act by Does not apply route daily.    Dispense:  2 each    Refill:  5   insulin glargine, 2 Unit Dial, (TOUJEO MAX SOLOSTAR) 300 UNIT/ML Solostar Pen    Sig: Inject 30 Units into the skin daily.    Dispense:  9 mL    Refill:  1     Follow-up: Return in about 6 months (around 11/06/2021).  Scarlette Calico, MD

## 2021-05-07 NOTE — Patient Instructions (Signed)

## 2021-05-08 ENCOUNTER — Ambulatory Visit (INDEPENDENT_AMBULATORY_CARE_PROVIDER_SITE_OTHER): Payer: Medicare PPO | Admitting: Podiatry

## 2021-05-08 ENCOUNTER — Other Ambulatory Visit: Payer: Self-pay

## 2021-05-08 DIAGNOSIS — E11621 Type 2 diabetes mellitus with foot ulcer: Secondary | ICD-10-CM | POA: Diagnosis not present

## 2021-05-08 DIAGNOSIS — L97411 Non-pressure chronic ulcer of right heel and midfoot limited to breakdown of skin: Secondary | ICD-10-CM | POA: Diagnosis not present

## 2021-05-08 DIAGNOSIS — E119 Type 2 diabetes mellitus without complications: Secondary | ICD-10-CM

## 2021-05-08 NOTE — Progress Notes (Signed)
  Subjective:  Patient ID: Jason [name], male    DOB: 1941-05-25,  MRN: [id number]  Chief Complaint  Patient presents with   Foot Ulcer    No new concerns, denies fever/chills/nausea/vomiting.   80 y.o. male presents for wound care. Hx confirmed with patient. Thinks the area is doing better. Objective:  Physical Exam: Wound Location: right submet 1 Wound Measurement: appears healed. Wound Base: Granular/Healthy Peri-wound: Calloused, blistered Exudate: moderate amount dried hemorrage wound without warmth, erythema, signs of acute infection Assessment:   1. Diabetic ulcer of right midfoot associated with type 2 diabetes mellitus, limited to breakdown of skin ([place])     Plan:  Patient was evaluated and treated and all questions answered.  Ulcer righ 1st met with blister -Debrided again. No open ulcer noted. -Will benefit from routine debridement of callus to prevent further issues. F/u for this in 3 months -Discussed importance of wearing his insert.  Return in about 3 months (around 08/08/2021) for Diabetic Foot Care.

## 2021-05-11 ENCOUNTER — Other Ambulatory Visit: Payer: Self-pay

## 2021-05-11 ENCOUNTER — Telehealth: Payer: Self-pay | Admitting: *Deleted

## 2021-05-11 DIAGNOSIS — I5031 Acute diastolic (congestive) heart failure: Secondary | ICD-10-CM

## 2021-05-11 NOTE — Chronic Care Management (AMB) (Signed)
  Chronic Care Management   Note  05/11/2021 Name: TAMARI BUSIC MRN: [id number] DOB: 11/29/1940  SABIAN KUBA is a 80 y.o. year old male who is a primary care patient of [name], MD. I reached out to Lucile Shutters by phone today in response to a referral sent by Mr. Aldric Wenzler Delana Meyer PCP, Arvid Right, MD     Mr. Vanwey was given information about Chronic Care Management services today including:  CCM service includes personalized support from designated clinical staff supervised by his physician, including individualized plan of care and coordination with other care providers 24/7 contact phone numbers for assistance for urgent and routine care needs. Service will only be billed when office clinical staff spend 20 minutes or more in a month to coordinate care. Only one practitioner may furnish and bill the service in a calendar month. The patient may stop CCM services at any time (effective at the end of the month) by phone call to the office staff. The patient will be responsible for cost sharing (co-pay) of up to 20% of the service fee (after annual deductible is met).  Patient agreed to services and verbal consent obtained.   Follow up plan: Telephone appointment with care management team member scheduled for:05/13/2021  Julian Hy, [place], [place] Management  Direct Dial: [phone]

## 2021-05-12 ENCOUNTER — Telehealth: Payer: Self-pay | Admitting: Pharmacist

## 2021-05-12 NOTE — Progress Notes (Signed)
Chronic Care Management Pharmacy Assistant   Name: Jason [name]  MRN: [id number] DOB: 05-Aug-1941  Reason for Encounter: Disease State - Diabetes   Recent office visits:  05/07/21 Ronnald Ramp (PCP) - Atherosclerosis of aorta. No med changes.  Recent consult visits:  05/08/21 Price (Podiatry) - Diabetic ulcer of right midfoot associated with type 2 diabetes mellitus.  04/30/21 Gershon Crane (Ophthalmology) - Diabetic eye exam, cataract check.  03/31/21 Price (Podiatry) - Blister of plantar, right foot.  Hospital visits:  None in previous 6 months  Medications: Outpatient Encounter Medications as of 05/12/2021  Medication Sig   blood glucose meter kit and supplies KIT Use to check blood sugar daily. DX: E11.   Continuous Blood Gluc Receiver (FREESTYLE LIBRE 2 READER) DEVI 1 Act by Does not apply route daily.   Continuous Blood Gluc Sensor (FREESTYLE LIBRE 2 SENSOR) MISC 1 Act by Does not apply route daily.   Glucagon (GVOKE HYPOPEN 2-PACK) 1 MG/0.2ML SOAJ Inject 1 Act into the skin daily as needed.   glucose blood (COOL BLOOD GLUCOSE TEST STRIPS) test strip Use to check blood sugar daily. DX: E11.   insulin glargine, 2 Unit Dial, (TOUJEO MAX SOLOSTAR) 300 UNIT/ML Solostar Pen Inject 30 Units into the skin daily.   Insulin Pen Needle 32G X 6 MM MISC 1 Act by Does not apply route in the morning and at bedtime.   metFORMIN (GLUCOPHAGE) 500 MG tablet TAKE 2 TABLETS TWICE DAILY WITH MEALS   Multiple Vitamins-Minerals (MULTIVITAMIN WITH MINERALS) tablet Take 1 tablet by mouth daily.   rivaroxaban (XARELTO) 20 MG TABS tablet Take 1 tablet (20 mg total) by mouth daily with supper.   Semaglutide,0.25 or 0.5MG/DOS, (OZEMPIC, 0.25 OR 0.5 MG/DOSE,) 2 MG/1.5ML SOPN Inject 1 mg into the skin once a week.   simvastatin (ZOCOR) 40 MG tablet Take 1 tablet (40 mg total) by mouth daily at 6 PM.   thiamine (VITAMIN B-1) 50 MG tablet Take 1 tablet (50 mg total) by mouth daily.   TRUEplus Safety Lancets 28G MISC 1  Act by Does not apply route in the morning and at bedtime.   vitamin C (ASCORBIC ACID) 500 MG tablet Take 1,000 mg by mouth daily.   Wound Dressings (MEDIHONEY WOUND/BURN DRESSING) GEL Apply to affected are 3 times a week, and cover with sterile dressing.   No facility-administered encounter medications on file as of 05/12/2021.    Recent Relevant Labs: Lab Results  Component Value Date/Time   HGBA1C 9.3 (H) 05/07/2021 01:54 PM   HGBA1C 8.2 (H) 01/06/2021 11:51 AM   MICROALBUR 2.1 05/21/2020 02:58 PM   MICROALBUR 11.3 (H) 04/18/2019 10:43 AM    Kidney Function Lab Results  Component Value Date/Time   CREATININE 1.21 05/07/2021 01:54 PM   CREATININE 0.95 01/06/2021 11:51 AM   CREATININE 1.00 05/21/2020 02:58 PM   GFR 57.09 (L) 05/07/2021 01:54 PM   GFRNONAA >60 05/17/2019 06:29 AM   GFRAA >60 05/17/2019 06:29 AM    Current antihyperglycemic regimen:  Meformin 500 mg - 2 tab BID Toujeo Max 30 units daily Ozempic 1 mg once a week Gvoke Hypopen Freestyle Libre CGM  What recent interventions/DTPs have been made to improve glycemic control:  None noted  Have there been any recent hospitalizations or ED visits since last visit with CPP? No  Patient denies hypoglycemic symptoms, including None  Patient denies hyperglycemic symptoms, including none  How often are you checking your blood sugar? at bedtime  What are your blood sugars ranging?  Patient states he checked his glucose at 2 am and it was within normal range.  During the week, how often does your blood glucose drop below 70? Never  Are you checking your feet daily/regularly?  Patient states he checks his feet daily and they are fine.  Adherence Review: Is the patient currently on a STATIN medication? No Is the patient currently on ACE/ARB medication? No Does the patient have >5 day gap between last estimated fill dates? No   Star Rating Drugs: Simvastatin - 08/18/20 90D (no longer taking) Ozempic - 10/01/20  28D Metfomin - 08/18/20 90D Patient states he taking all prescribed medications he receive through mail order.  Orinda Kenner, [place] Clinical Pharmacists Assistant [phone]  Time Spent: [phone]

## 2021-05-13 ENCOUNTER — Ambulatory Visit (INDEPENDENT_AMBULATORY_CARE_PROVIDER_SITE_OTHER): Payer: Medicare PPO | Admitting: *Deleted

## 2021-05-13 DIAGNOSIS — I70209 Unspecified atherosclerosis of native arteries of extremities, unspecified extremity: Secondary | ICD-10-CM

## 2021-05-13 DIAGNOSIS — E785 Hyperlipidemia, unspecified: Secondary | ICD-10-CM

## 2021-05-13 DIAGNOSIS — E1151 Type 2 diabetes mellitus with diabetic peripheral angiopathy without gangrene: Secondary | ICD-10-CM

## 2021-05-14 NOTE — Chronic Care Management (AMB) (Signed)
Chronic Care Management   CCM RN Visit Note  05/14/2021 Name: Jason [name] MRN: [id number] DOB: [birth year]  Subjective: Jason [name] is a 80 y.o. year old male who is a primary care patient of [name], MD. The care management team was consulted for assistance with disease management and care coordination needs.    Engaged with patient by telephone for initial visit in response to provider referral for case management and/or care coordination services.   Consent to Services:  The patient was given information about Chronic Care Management services, agreed to services, and gave verbal consent 05/11/21 prior to initiation of services.  Please see initial visit note for detailed documentation.  Patient agreed to services and verbal consent obtained.   Assessment: Review of patient past medical history, allergies, medications, health status, including review of consultants reports, laboratory and other test data, was performed as part of comprehensive evaluation and provision of chronic care management services.   SDOH (Social Determinants of Health) assessments and interventions performed:  SDOH Interventions    Flowsheet Row Most Recent Value  SDOH Interventions   Food Insecurity Interventions Other (Comment)  [Reports could use assistance with utility costs- would also consider MOW to help with food acquisition,  care guide referral placed]  Financial Strain Interventions Other (Comment)  [Reports could use assistance with utility costs- care guide referral placed]  Housing Interventions Other (Comment)  [Care Guide referral placed for resources around financial needs, utility bills,  possibility of MOW in future]  Transportation Interventions Intervention Not Indicated       CCM Care Plan  Allergies  Allergen Reactions   Bactrim [Sulfamethoxazole-Trimethoprim] Rash    Outpatient Encounter Medications as of 05/13/2021  Medication Sig   blood glucose meter kit and  supplies KIT Use to check blood sugar daily. DX: E11.   Continuous Blood Gluc Receiver (FREESTYLE LIBRE 2 READER) DEVI 1 Act by Does not apply route daily.   Continuous Blood Gluc Sensor (FREESTYLE LIBRE 2 SENSOR) MISC 1 Act by Does not apply route daily.   Glucagon (GVOKE HYPOPEN 2-PACK) 1 MG/0.2ML SOAJ Inject 1 Act into the skin daily as needed.   glucose blood (COOL BLOOD GLUCOSE TEST STRIPS) test strip Use to check blood sugar daily. DX: E11.   insulin glargine, 2 Unit Dial, (TOUJEO MAX SOLOSTAR) 300 UNIT/ML Solostar Pen Inject 30 Units into the skin daily.   Insulin Pen Needle 32G X 6 MM MISC 1 Act by Does not apply route in the morning and at bedtime.   metFORMIN (GLUCOPHAGE) 500 MG tablet TAKE 2 TABLETS TWICE DAILY WITH MEALS   Multiple Vitamins-Minerals (MULTIVITAMIN WITH MINERALS) tablet Take 1 tablet by mouth daily.   rivaroxaban (XARELTO) 20 MG TABS tablet Take 1 tablet (20 mg total) by mouth daily with supper.   Semaglutide,0.25 or 0.5MG/DOS, (OZEMPIC, 0.25 OR 0.5 MG/DOSE,) 2 MG/1.5ML SOPN Inject 1 mg into the skin once a week.   simvastatin (ZOCOR) 40 MG tablet Take 1 tablet (40 mg total) by mouth daily at 6 PM.   thiamine (VITAMIN B-1) 50 MG tablet Take 1 tablet (50 mg total) by mouth daily.   TRUEplus Safety Lancets 28G MISC 1 Act by Does not apply route in the morning and at bedtime.   vitamin C (ASCORBIC ACID) 500 MG tablet Take 1,000 mg by mouth daily.   Wound Dressings (MEDIHONEY WOUND/BURN DRESSING) GEL Apply to affected are 3 times a week, and cover with sterile dressing.   No facility-administered encounter medications  on file as of 05/13/2021.    Patient Active Problem List   Diagnosis Date Noted   Nonrheumatic mitral valve stenosis 01/27/2021   Atherosclerosis of aorta ([place]) 09/30/2020   DOE (dyspnea on exertion) 09/30/2020   Abnormal electrocardiogram (ECG) (EKG) 09/30/2020   Thiamine deficiency 07/04/2020   Ulcer of toe, right, with necrosis of bone ([place])     Diabetic ulcer of toe of right foot associated with type 2 diabetes mellitus, with fat layer exposed ([place]) 04/18/2019   Malignant neoplasm of urinary bladder (HCC) 07/11/2018   Chronic idiopathic constipation 06/12/2018   Diabetic infection of right foot ([employer]) 07/12/2017   Foot ulcer, right, with unspecified severity ([place]) 01/17/2017   Complex sleep apnea syndrome 01/12/2016   Diabetes mellitus type 2, uncontrolled, with complications ([place]) 57/11/7791   OSA (obstructive sleep apnea) 12/18/2015   Benign prostatic hyperplasia 02/18/2014   Routine general medical examination at a health care facility 02/18/2014   PAD (peripheral artery disease) ([place]) 02/18/2014   DEGENERATIVE JOINT DISEASE 02/20/2008   Diabetes mellitus type 2 with atherosclerosis of arteries of extremities ([place]) 01/26/2008   Hyperlipidemia with target LDL less than 70 01/26/2008   Morbid obesity ([place]) 01/26/2008   Mitral valve disorder 01/26/2008   GERD 01/26/2008   Irritable bowel syndrome 01/26/2008    Conditions to be addressed/monitored: HLD and DMII  Care Plan : Diabetes Type 2 (Adult)  Updates made by Knox Royalty, RN since 05/14/2021 12:00 AM     Problem: Disease Progression (Diabetes, Type 2)   Priority: Medium     Long-Range Goal: Disease Progression Prevented or Minimized   Start Date: 05/13/2021  Expected End Date: 05/13/2022  This Visit's Progress: On track  Priority: Medium  Note:   Objective:  Lab Results  Component Value Date   HGBA1C 9.3 (H) 05/07/2021   Lab Results  Component Value Date   CREATININE 1.21 05/07/2021   CREATININE 0.95 01/06/2021   CREATININE 1.11 09/30/2020   No results found for: EGFR Current Barriers:  Knowledge Deficits related to basic Diabetes pathophysiology and self care/management Knowledge Deficits related to medications used for management of diabetes- patient reports he often forgets to take his medications/ insulin Financial Constraints- reports could use  assistance/ resources for managing utility bills Unable to independently verbalize self-health management strategies for DM: diet, medications, routine home blood sugar monitoring Does not adhere to provider recommendations re: blood sugar monitoring at home; medications- often forgets to take: [facility] team involved High Fall Risk: reports 6-plus falls over last 12 months, all without injury Case Manager Clinical Goal(s):  Over the next 12 months, patient will demonstrate ongoing adherence to/ improvement in prescribed treatment plan for diabetes self care/management as evidenced by:  Daily monitoring and recording of CBG twice daily  Improved adherence to ADA/ carb modified diet  Adherence to prescribed medication regimen  Contacting care providers for new or worsened symptoms, concerns or questions Interventions:  Collaboration with [name], MD regarding development and update of comprehensive plan of care as evidenced by provider attestation and co-signature Inter-disciplinary care team collaboration (see longitudinal plan of care) Provided education to patient about basic DM disease process Review of patient status, including review of consultants reports, relevant laboratory and other test results, and medications completed Assessed patient's baseline knowledge around DM and current practices at home for monitoring blood sugars: he reports monitoring blood sugars daily "after breakfast;" encouraged patient to begin monitoring fasting blood sugars in am, and encouraged him to obtain newly  prescribed CGM, and then to begin monitoring BID: fasting and 2-hours post-prandial; encouraged patient to write all recorded blood sugars from home down on paper for our future review together Discussed goals for blood sugar values at home, fasting and post-prandial; initiated basic verbal education around A1-C value significance, correlation to home blood sugar readings Discussed patient's  medication management at home: he reports often forgetting to take medications, did not take insulin last night; confirmed CCM Pharmacy team currently active in patient's care: will message CCM pharmacy team update from today's conversation with patient.  Encouraged patient to begin using weekly pill box and to place written reminders in his home to remind him to take his daily long-acting insulin; he denies difficulty remembering once weekly Ozempic- reports overall good compliance with that medication Declines medication review today- encouraged his ongoing engagement with [facility] team Initiated basic education around complications/ risks of long-term high blood sugars and encouraged patient to begin making small changes to lifestyle, diet, medication practices at home to prevent future complications- will mail updated Living Well with Diabetes booklet, encouraged him to begin reviewing and write down any questions he has for our future discussions Discussed with patient general cardiovascular risks, need/ importance of keeping HLD under control in setting of concurrent DM Confirmed that patient has good baseline knowledge/ understanding of importance of routine foot care in setting of DM; reviewed recent podiatry provider office visit with him and confirmed that he attends podiatry provider visits regularly- positive reinforcement provided Referral made to community resources care guide team to contact patient to provide resources for utility costs and possibility of need for Meals on Wheels in future Discussed/ provided verbal education around Advanced Directive planning: will place printed educational material/ planning packet in mail to patient Discussed patient's report of multiple falls without injury over last 12 months: encouraged him to begin using assistive devices regularly; patient is unable to tell me definitively what causes him to fall so frequently   Reviewed scheduled/upcoming  provider appointments including: 07/31/21 cardiology office visit/ [facility]; 08/14/21 podiatry follow up visit Discussed plans with patient for ongoing care management follow up and provided patient with direct contact information for care management team Self-Care Activities UNABLE to independently verbalize dietary strategies for self-management of DM Self administers oral medications, insulin, and injectable Ozempic as prescribed- but admits often forgets medications Attends all scheduled provider appointments Checks blood sugars at home once-twice daily Attempts to adhere to prescribed ADA/carb modified- admits needs to begin making better dietary choices Patient Goals: Learn about how having diabetes affects my health Continue monitoring and writing down blood sugars at home: every morning before eating (fasting) and then again 2 hours after a meal Take your medications as prescribed: consider using a weekly pill box at home and consider making a written reminder to take your medications: especially your insulin at night We will review your blood sugars at home during each of our phone calls- please have these blood sugar values ready for review when we talk on May 27, 2021 Please have your medications ready to review the next time we talk over the phone Please read over the attached information and begin making small changes to your diet to decrease your blood sugar levels Talk to my doctor about my target A1C and work toward meeting that target- we will talk more about this during our future phone calls  Follow Up Plan:  Telephone follow up appointment with care management team member scheduled for: Wednesday May 27, 2021  at 11:30 am The patient has been provided with contact information for the care management team and has been advised to call with any health related questions or concerns.      Plan: Telephone follow up appointment with care management team member scheduled for:   Wednesday May 27, 2021 at 11:30 am The patient has been provided with contact information for the care management team and has been advised to call with any health related questions or concerns.   [name], RN, BSN, [facility] RN Care Coordination- [place] [phone]: direct office [phone]: mobile

## 2021-05-14 NOTE — Patient Instructions (Signed)
Visit Information   Jason [name], it was nice talking with you today.   Please read over the attached information, and start now to check your blood sugars at home before you eat in the morning, and then again later in the day, 2 hours after a meal- we will review these blood sugars during our future phone calls.  Please make yourself a written reminder to place in your home somewhere you will easily see it, to remind you to take your medications every day.  It is very important that you begin taking your insulin as prescribed.   I look forward to talking to you again for an update on Wednesday, July 13 at 11:30 am- please be listening out for my call that day.  I will call as close to 11:30 am as possible; I look forward to hearing about your progress.   Please don't hesitate to contact me if I can be of assistance to you before our next scheduled appointment.   [name], RN, BSN, [facility] RN Care Coordination- [place] [phone]: direct office [phone]: mobile    PATIENT GOALS:   Goals Addressed             This Visit's Progress    Set My Target A1C-Diabetes Type 2   On track    Timeframe:  Long-Range Goal Priority:  Medium Start Date:     05/13/21                        Expected End Date:        05/13/22               Follow Up Date 05/27/2021    Learn about how having diabetes affects my health Continue monitoring and writing down blood sugars at home: every morning before eating (fasting) and then again 2 hours after a meal Take your medications as prescribed: consider using a weekly pill box at home and consider making a written reminder to take your medications: especially your insulin at night We will review your blood sugars at home during each of our phone calls- please have these blood sugar values ready for review when we talk on May 27, 2021 Please have your medications ready to review the next time we talk over the  phone Please read over the attached information and begin making small changes to your diet to decrease your blood sugar levels Talk to my doctor about my target A1C and work toward meeting that target- we will talk more about this during our future phone calls    Why is this important?   Your target A1C is decided together by you and your doctor.  It is based on several things like your age and other health issues.            Understanding Your Risk for Falls Each year, millions of people have serious injuries from falls. It is important to understand your risk for falling. Talk with your health care provider about your risk and what you can do to lower it. There are actions you can take athome to lower your risk and prevent falls. If you do have a serious fall, make sure to tell your health care provider.Falling once raises your risk of falling again. How can falls affect me? Serious injuries from falls are common. These include: Broken bones, such as hip fractures. Head injuries, such as traumatic brain injuries (TBI) or  concussion. A fear of falling can cause you to avoid activities and stay at home. This canmake your muscles weaker and actually raise your risk for a fall. What can increase my risk? There are a number of risk factors that increase your risk for falling. The more risk factors you have, the higher your risk of falling. Serious injuries from a fall happen most often to people older than age 71. Children and youngadults ages 87-29 are also at higher risk. Common risk factors include: Weakness in the lower body. Lack (deficiency) of vitamin D. Being generally weak or confused due to long-term (chronic) illness. Dizziness or balance problems. Poor vision. Medicines that cause dizziness or drowsiness. These can include medicines for your blood pressure, heart, anxiety, insomnia, or edema, as well as pain medicines and muscle relaxants. Other risk factors  include: Drinking alcohol. Having had a fall in the past. Having depression. Having foot pain or wearing improper footwear. Working at a dangerous job. Having any of the following in your home: Tripping hazards, such as floor clutter or loose rugs. Poor lighting. Pets. Having dementia or memory loss. What actions can I take to lower my risk of falling?     Physical activity Maintain physical fitness. Do strength and balance exercises. Consider taking aregular class to build strength and balance. Yoga and tai chi are good options. Vision Have your eyes checked every year and your vision prescription updated asneeded. Walking aids and footwear Wear nonskid shoes. Do not wear high heels. Do not walk around the house in socks or slippers. Use a cane or walker as told by your health care provider. Home safety Attach secure railings on both sides of your stairs. Install grab bars for your tub, shower, and toilet. Use a bath mat in your tub or shower. Use good lighting in all rooms. Keep a flashlight near your bed. Make sure there is a clear path from your bed to the bathroom. Use night-lights. Do not use throw rugs. Make sure all carpeting is taped or tacked down securely. Remove all clutter from walkways and stairways, including extension cords. Repair uneven or broken steps. Avoid walking on icy or slippery surfaces. Walk on the grass instead of on icy or slick sidewalks. Use ice melt to get rid of ice on walkways. Use a cordless phone. Questions to ask your health care provider Can you help me check my risk for a fall? Do any of my medicines make me more likely to fall? Should I take a vitamin D supplement? What exercises can I do to improve my strength and balance? Should I make an appointment to have my vision checked? Do I need a bone density test to check for weak bones or osteoporosis? Would it help to use a cane or a walker? Where to find more information Centers for  Disease Control and Prevention, STEADI: http://www.wolf.info/ Community-Based Fall Prevention Programs: http://www.wolf.info/ National Institute on Aging: http://kim-miller.com/ Contact a health care provider if: You fall at home. You are afraid of falling at home. You feel weak, drowsy, or dizzy. Summary Serious injuries from a fall happen most often to people older than age 65. Children and young adults ages 25-29 are also at higher risk. Talk with your health care provider about your risks for falling and how to lower those risks. Taking certain precautions at home can lower your risk for falling. If you fall, always tell your health care provider. This information is not intended to replace advice given to you by  your health care provider. Make sure you discuss any questions you have with your healthcare provider. Document Revised: 06/04/2020 Document Reviewed: 06/04/2020 Elsevier Patient Education  [place].  Critical care medicine: Principles of diagnosis and management in the adult (4th ed., pp. 0623-7628). Saunders."> Miller's anesthesia (8th ed., pp. 232-250). Saunders.">  Advance Directive  Advance directives are legal documents that allow you to make decisions about your health care and medical treatment in case you become unable to communicate for yourself. Advance directives let your wishes be known to family, friends,and health care providers. Discussing and writing advance directives should happen over time rather than all at once. Advance directives can be changed and updated at any time. There are different types of advance directives, such as: Medical power of attorney. Living will. Do not resuscitate (DNR) order or do not attempt resuscitation (DNAR) order. Health care proxy and medical power of attorney A health care proxy is also called a health care agent. This person is appointed to make medical decisions for you when you are unable to make decisions for yourself. Generally, people ask  a trusted friend or family member to act as their proxy and represent their preferences. Make sure you have an agreement with your trusted person to act as your proxy. A proxy may have tomake a medical decision on your behalf if your wishes are not known. A medical power of attorney, also called a durable power of attorney for health care, is a legal document that names your health care proxy. Depending on the laws in your state, the document may need to be: Signed. Notarized. Dated. Copied. Witnessed. Incorporated into your medical record. You may also want to appoint a trusted person to manage your money in the event you are unable to do so. This is called a durable power of attorney for finances. It is a separate legal document from the durable power of attorney for health care. You may choose your health care proxy or someone different toact as your agent in money matters. If you do not appoint a proxy, or there is a concern that the proxy is not acting in your best interest, a court may appoint a guardian to act on yourbehalf. Living will A living will is a set of instructions that state your wishes about medical care when you cannot express them yourself. Health care providers should keep a copy of your living will in your medical record. You may want to give a copy to family members or friends. To alert caregivers in case of an emergency, you can place a card in your wallet to let them know that you have a living will and where they can find it. A living will is used if you become: Terminally ill. Disabled. Unable to communicate or make decisions. The following decisions should be included in your living will: To use or not to use life support equipment, such as dialysis machines and breathing machines (ventilators). Whether you want a DNR or DNAR order. This tells health care providers not to use cardiopulmonary resuscitation (CPR) if breathing or heartbeat stops. To use or not to use tube  feeding. To be given or not to be given food and fluids. Whether you want comfort (palliative) care when the goal becomes comfort rather than a cure. Whether you want to donate your organs and tissues. A living will does not give instructions for distributing your money andproperty if you should pass away. DNR or DNAR A DNR or DNAR order  is a request not to have CPR in the event that your heart stops beating or you stop breathing. If a DNR or DNAR order has not been made and shared, a health care provider will try to help any patient whose heart has stopped or who has stopped breathing. If you plan to have surgery, talk with your health care provider about how your DNR or DNAR order will be followed ifproblems occur. What if I do not have an advance directive? Some states assign family decision makers to act on your behalf if you do not have an advance directive. Each state has its own laws about advance directives. You may want to check with your health care provider, attorney, orstate representative about the laws in your state. Summary Advance directives are legal documents that allow you to make decisions about your health care and medical treatment in case you become unable to communicate for yourself. The process of discussing and writing advance directives should happen over time. You can change and update advance directives at any time. Advance directives may include a medical power of attorney, a living will, and a DNR or DNAR order. This information is not intended to replace advice given to you by your health care provider. Make sure you discuss any questions you have with your healthcare provider. Document Revised: 08/05/2020 Document Reviewed: 08/05/2020 Elsevier Patient Education  2022 [employer].   Consent to CCM Services: Jason [name] was given information about Chronic Care Management services 05/11/21 including:  CCM service includes personalized support from designated clinical  staff supervised by his physician, including individualized plan of care and coordination with other care providers 24/7 contact phone numbers for assistance for urgent and routine care needs. Service will only be billed when office clinical staff spend 20 minutes or more in a month to coordinate care. Only one practitioner may furnish and bill the service in a calendar month. The patient may stop CCM services at any time (effective at the end of the month) by phone call to the office staff. The patient will be responsible for cost sharing (co-pay) of up to 20% of the service fee (after annual deductible is met).  Patient agreed to services and verbal consent obtained.   The patient verbalized understanding of instructions, educational materials, and care plan provided today and agreed to receive a mailed copy of patient instructions, educational materials, and care plan.  Telephone follow up appointment with care management team member scheduled for:  Wednesday, July 13 at 11:30 am The patient has been provided with contact information for the care management team and has been advised to call with any health related questions or concerns.   [name], RN, BSN, [facility] RN Care Coordination- [place] [phone]: direct office [phone]: mobile    CLINICAL CARE PLAN:   Patient Care Plan: Diabetes Type 2 (Adult)      Problem Identified: Disease Progression (Diabetes, Type 2)   Priority: Medium     Long-Range Goal: Disease Progression Prevented or Minimized   Start Date: 05/13/2021  Expected End Date: 05/13/2022  This Visit's Progress: On track  Priority: Medium  Note:   Objective:  Lab Results  Component Value Date   HGBA1C 9.3 (H) 05/07/2021   Lab Results  Component Value Date   CREATININE 1.21 05/07/2021   CREATININE 0.95 01/06/2021   CREATININE 1.11 09/30/2020   No results found for: EGFR Current Barriers:  Knowledge  Deficits related to basic Diabetes  pathophysiology and self care/management Knowledge Deficits related to medications used for management of diabetes- patient reports he often forgets to take his medications/ insulin Financial Constraints- reports could use assistance/ resources for managing utility bills Unable to independently verbalize self-health management strategies for DM: diet, medications, routine home blood sugar monitoring Does not adhere to provider recommendations re: blood sugar monitoring at home; medications- often forgets to take: [place] team involved High Fall Risk: reports 6-plus falls over last 12 months, all without injury Case Manager Clinical Goal(s):  Over the next 12 months, patient will demonstrate ongoing adherence to/ improvement in prescribed treatment plan for diabetes self care/management as evidenced by:  Daily monitoring and recording of CBG twice daily  Improved adherence to ADA/ carb modified diet  Adherence to prescribed medication regimen  Contacting care providers for new or worsened symptoms, concerns or questions Interventions:  Collaboration with [name], MD regarding development and update of comprehensive plan of care as evidenced by provider attestation and co-signature Inter-disciplinary care team collaboration (see longitudinal plan of care) Provided education to patient about basic DM disease process Review of patient status, including review of consultants reports, relevant laboratory and other test results, and medications completed Assessed patient's baseline knowledge around DM and current practices at home for monitoring blood sugars: he reports monitoring blood sugars daily "after breakfast;" encouraged patient to begin monitoring fasting blood sugars in am, and encouraged him to obtain newly prescribed CGM, and then to begin monitoring BID: fasting and 2-hours post-prandial; encouraged patient to write all recorded blood sugars from  home down on paper for our future review together Discussed goals for blood sugar values at home, fasting and post-prandial; initiated basic verbal education around A1-C value significance, correlation to home blood sugar readings Discussed patient's medication management at home: he reports often forgetting to take medications, did not take insulin last night; confirmed CCM Pharmacy team currently active in patient's care: will message CCM pharmacy team update from today's conversation with patient.  Encouraged patient to begin using weekly pill box and to place written reminders in his home to remind him to take his daily long-acting insulin; he denies difficulty remembering once weekly Ozempic- reports overall good compliance with that medication Declines medication review today- encouraged his ongoing engagement with [place] team Initiated basic education around complications/ risks of long-term high blood sugars and encouraged patient to begin making small changes to lifestyle, diet, medication practices at home to prevent future complications- will mail updated Living Well with Diabetes booklet, encouraged him to begin reviewing and write down any questions he has for our future discussions Discussed with patient general cardiovascular risks, need/ importance of keeping HLD under control in setting of concurrent DM Confirmed that patient has good baseline knowledge/ understanding of importance of routine foot care in setting of DM; reviewed recent podiatry provider office visit with him and confirmed that he attends podiatry provider visits regularly- positive reinforcement provided Referral made to community resources care guide team to contact patient to provide resources for utility costs and possibility of need for Meals on Wheels in future Discussed/ provided verbal education around Advanced Directive planning: will place printed educational material/ planning packet in mail to  patient Discussed patient's report of multiple falls without injury over last 12 months: encouraged him to begin using assistive devices regularly; patient is unable to tell me definitively what causes him to fall so frequently   Reviewed scheduled/upcoming provider appointments including: 07/31/21 cardiology office visit/ [place]; 08/14/21 podiatry follow  up visit Discussed plans with patient for ongoing care management follow up and provided patient with direct contact information for care management team Self-Care Activities UNABLE to independently verbalize dietary strategies for self-management of DM Self administers oral medications, insulin, and injectable Ozempic as prescribed- but admits often forgets medications Attends all scheduled provider appointments Checks blood sugars at home once-twice daily Attempts to adhere to prescribed ADA/carb modified- admits needs to begin making better dietary choices Patient Goals: Learn about how having diabetes affects my health Continue monitoring and writing down blood sugars at home: every morning before eating (fasting) and then again 2 hours after a meal Take your medications as prescribed: consider using a weekly pill box at home and consider making a written reminder to take your medications: especially your insulin at night We will review your blood sugars at home during each of our phone calls- please have these blood sugar values ready for review when we talk on May 27, 2021 Please have your medications ready to review the next time we talk over the phone Please read over the attached information and begin making small changes to your diet to decrease your blood sugar levels Talk to my doctor about my target A1C and work toward meeting that target- we will talk more about this during our future phone calls  Follow Up Plan:  Telephone follow up appointment with care management team member scheduled for: Wednesday May 27, 2021 at 11:30  am The patient has been provided with contact information for the care management team and has been advised to call with any health related questions or concerns.

## 2021-05-15 ENCOUNTER — Telehealth: Payer: Self-pay

## 2021-05-15 NOTE — Telephone Encounter (Signed)
   Telephone encounter was:  Successful.  05/15/2021 Name: Jason [name] MRN: [id number] DOB: May 12, 1941  [name] is a 80 y.o. year old male who is a primary care patient of [name], MD . The community resource team was consulted for assistance with  meals on wheels and utility assistance.  Care guide performed the following interventions: Spoke with patient he requested that I email the information for Meals on Wheels for him and his wife and Crisis Intervention Program for utility assistance. Verified email [email or web address]  Follow Up Plan:  Care guide will follow up with patient by phone over the next 7 days.  Anastazia Creek, AAS Paralegal, [facility]  [address], [place] [address] ??[name]  ?? [phone]   [email or web address]

## 2021-05-19 ENCOUNTER — Telehealth: Payer: Self-pay | Admitting: Internal Medicine

## 2021-05-19 ENCOUNTER — Other Ambulatory Visit: Payer: Self-pay | Admitting: Internal Medicine

## 2021-05-19 DIAGNOSIS — E1151 Type 2 diabetes mellitus with diabetic peripheral angiopathy without gangrene: Secondary | ICD-10-CM

## 2021-05-19 MED ORDER — COOL BLOOD GLUCOSE TEST STRIPS VI STRP: ORAL_STRIP | 3 refills | Status: DC

## 2021-05-19 NOTE — Telephone Encounter (Signed)
[facility] FYI 7.4.22:  ---Caller states he was feeling nauseas this morning, He is diabetic. He does not have any test strips. S&S started yesterday. He ate a snack and made him feel a little better.  Advised to call EMS 911 now   FYI- needs a refill on  glucose blood (COOL BLOOD GLUCOSE TEST STRIPS) test strip

## 2021-05-25 ENCOUNTER — Other Ambulatory Visit: Payer: Self-pay | Admitting: Internal Medicine

## 2021-05-25 ENCOUNTER — Telehealth: Payer: Self-pay | Admitting: Internal Medicine

## 2021-05-25 DIAGNOSIS — E1151 Type 2 diabetes mellitus with diabetic peripheral angiopathy without gangrene: Secondary | ICD-10-CM

## 2021-05-25 MED ORDER — ACCU-CHEK AVIVA PLUS W/DEVICE KIT
1.0000 | PACK | Freq: Three times a day (TID) | 2 refills | Status: DC
Start: 2021-05-25 — End: 2022-03-10

## 2021-05-25 MED ORDER — ACCU-CHEK AVIVA PLUS VI STRP: 1.0000 | ORAL_STRIP | Freq: Three times a day (TID) | 2 refills | Status: DC

## 2021-05-25 MED ORDER — ACCU-CHEK AVIVA VI SOLN: 1.0000 | Freq: Three times a day (TID) | 2 refills | Status: DC

## 2021-05-25 NOTE — Telephone Encounter (Signed)
   Patient calling to report he has test strips but they do not fit glucometer  He is requesting Accu chek aviva glucometer and test strips  Pharmacy [facility], [place] AT [address]

## 2021-05-27 ENCOUNTER — Ambulatory Visit (INDEPENDENT_AMBULATORY_CARE_PROVIDER_SITE_OTHER): Payer: Medicare PPO | Admitting: *Deleted

## 2021-05-27 ENCOUNTER — Telehealth: Payer: Self-pay

## 2021-05-27 DIAGNOSIS — E119 Type 2 diabetes mellitus without complications: Secondary | ICD-10-CM

## 2021-05-27 DIAGNOSIS — E785 Hyperlipidemia, unspecified: Secondary | ICD-10-CM | POA: Diagnosis not present

## 2021-05-27 DIAGNOSIS — E118 Type 2 diabetes mellitus with unspecified complications: Secondary | ICD-10-CM | POA: Diagnosis not present

## 2021-05-27 DIAGNOSIS — Z794 Long term (current) use of insulin: Secondary | ICD-10-CM | POA: Diagnosis not present

## 2021-05-27 DIAGNOSIS — E1165 Type 2 diabetes mellitus with hyperglycemia: Secondary | ICD-10-CM | POA: Diagnosis not present

## 2021-05-27 DIAGNOSIS — IMO0002 Reserved for concepts with insufficient information to code with codable children: Secondary | ICD-10-CM

## 2021-05-27 NOTE — Patient Instructions (Signed)
Visit Information  Jason [name], it was nice talking with you today.   Please read over the attached information, and please start now to start checking your blood sugars at home and writing them down on paper, so we can review these during our future phone calls  Please make every effort to take steps to take your medications regularly, as they are prescribed; it is very important that you take the medications that have been prescribed for you to get your blood sugar under control   I look forward to talking to you again for an update on Tuesday June 30, 2021 at 11:30 am- please be listening out for my call that day.  I will call as close to 11:30 am as possible; I look forward to hearing about your progress.   Please don't hesitate to contact me if I can be of assistance to you before our next scheduled appointment.   [name], RN, BSN, [facility] RN Care Coordination- [place] [phone]: direct office [phone]: mobile    PATIENT GOALS:  Goals Addressed             This Visit's Progress    Get in a routine at home for managing Diabetes       Timeframe:  Long-Range Goal Priority:  Medium Start Date:     05/13/21                        Expected End Date:        05/13/22               Follow Up Date 06/30/2021    Continue to learn about how having diabetes affects my health As soon as possible, please begin regular monitoring and writing down of your blood sugars at home: every morning before eating (fasting) and then again 2 hours after a meal Take your medications as prescribed: consider using a weekly pill box at home and consider making a written reminder to take your medications: especially your insulin at night We will review your blood sugars at home during each of our phone calls- please have these blood sugar values ready for review when we talk on June 30, 2021 Great job making small changes to your diet to decrease your blood  sugar levels: today you have reported that you have stopped drinking beer and have made changes to the way you organize your food at home-- these are great strategies to help you begin decreasing your blood sugars-- this will also help your cholesterol levels Continue reviewing the educational material and the "Living Well with Diabetes" booklet: learn as much as you can about things you can do to decrease your blood sugar levels Talk to my doctor about my target A1C and work toward meeting that target- we will talk more about this during our future phone calls    Why is this important?   Your target A1C is decided together by you and your doctor.  It is based on several things like your age and other health issues.            Blood Glucose Monitoring, Adult Monitoring your blood sugar (glucose) is an important part of managing your diabetes. Blood glucose monitoring involves checking your blood glucose as often as directed and keeping a log orrecord of your results over time. Checking your blood glucose regularly and keeping a blood glucose log can: Help you and your health  care provider adjust your diabetes management plan as needed, including your medicines or insulin. Help you understand how food, exercise, illnesses, and medicines affect your blood glucose. Let you know what your blood glucose is at any time. You can quickly find out if you have low blood glucose (hypoglycemia) or high blood glucose (hyperglycemia). Your health care provider will set individualized treatment goals for you. Your goals will be based on your age, other medical conditions you have, and how you respond to diabetes treatment. Generally, the goal of treatment is to maintain the following blood glucose levels: Before meals (preprandial): 80-130 mg/dL (4.4-7.2 mmol/L). After meals (postprandial): below 180 mg/dL (10 mmol/L). A1C level: less than 7%. Supplies needed: Blood glucose meter. Test strips for your  meter. Each meter has its own strips. You must use the strips that came with your meter. A needle to prick your finger (lancet). Do not use a lancet more than one time. A device that holds the lancet (lancing device). A journal or log book to write down your results. How to check your blood glucose Checking your blood glucose  Wash your hands for at least 20 seconds with soap and water. Prick the side of your finger (not the tip) with the lancet. Do not use the same finger consecutively. Gently rub the finger until a small drop of blood appears. Follow instructions that come with your meter for inserting the test strip, applying blood to the strip, and using your blood glucose meter. Write down your result and any notes in your log.  Using alternative sites Some meters allow you to use areas of your body other than your finger (alternative sites) to test your blood. The most common alternative sites are the forearm, thethigh, and the palm of your hand. Alternative sites may not be as accurate as the fingers because blood flow is slower in those areas. This means that the result you get may be delayed, andit may be different from the result that you would get from your finger. Use the finger only, and do not use alternative sites, if: You think you have hypoglycemia. You sometimes do not know that your blood glucose is getting low (hypoglycemia unawareness). General tips and recommendations Blood glucose log  Every time you check your blood glucose, write down your result. Also write down any notes about things that may be affecting your blood glucose, such as your diet and exercise for the day. This information can help you and your health care provider: Look for patterns in your blood glucose over time. Adjust your diabetes management plan as needed. Check if your meter allows you to download your records to a computer or if there is an app for the meter. Most glucose meters store a record  of glucose readings in the meter.  If you have type 1 diabetes: Check your blood glucose 4 or more times a day if you are on intensive insulin therapy with multiple daily injections (MDI) or if you are using an insulin pump. Check your blood glucose: Before every meal and snack. Before bedtime. Also check your blood glucose: If you have symptoms of hypoglycemia. After treating low blood glucose. Before doing activities that create a risk for injury, like driving or using machinery. Before and after exercise. Two hours after a meal. Occasionally between 2:00 a.m. and 3:00 a.m., as directed. You may need to check your blood glucose more often, 6-10 times per day, if: You have diabetes that is not well controlled. You  are ill. You have a history of severe hypoglycemia. You have hypoglycemia unawareness. If you have type 2 diabetes: Check your blood glucose 2 or more times a day if you take insulin or other diabetes medicines. Check your blood glucose 4 or more times a day if you are on intensive insulin therapy. Occasionally, you may also need to check your glucose between 2:00 a.m. and 3:00 a.m., as directed. Also check your blood glucose: Before and after exercise. Before doing activities that create a risk for injury, like driving or using machinery. You may need to check your blood glucose more often if: Your medicine is being adjusted. Your diabetes is not well controlled. You are ill. General tips Make sure you always have your supplies with you. After you use a few boxes of test strips, adjust (calibrate) your blood glucose meter by following instructions that came with your meter. If you have questions or need help, all blood glucose meters have a 24-hour hotline phone number available that you can call. Also contact your health care provider with questions or concerns you may have. Where to find more information The American Diabetes Association: www.diabetes.org The  Association of Diabetes Care & Education Specialists: www.diabeteseducator.org Contact a health care provider if: Your blood glucose is at or above 240 mg/dL (13.3 mmol/L) for 2 days in a row. You have been sick or have had a fever for 2 days or longer, and you are not getting better. You have any of the following problems for more than 6 hours: You cannot eat or drink. You have nausea or vomiting. You have diarrhea. Get help right away if: Your blood glucose is lower than 54 mg/dL (3 mmol/L). You become confused, or you have trouble thinking clearly. You have difficulty breathing. You have moderate or large ketone levels in your urine. These symptoms may represent a serious problem that is an emergency. Do not wait to see if the symptoms will go away. Get medical help right away. Call your local emergency services (911 in the [place] Do not drive yourself to the hospital. Summary Monitoring your blood glucose is an important part of managing your diabetes. Blood glucose monitoring involves checking your blood glucose as often as directed and keeping a log or record of your results over time. Your health care provider will set individualized treatment goals for you. Your goals will be based on your age, other medical conditions you have, and how you respond to diabetes treatment. Every time you check your blood glucose, write down your result. Also, write down any notes about things that may be affecting your blood glucose, such as your diet and exercise for the day. This information is not intended to replace advice given to you by your health care provider. Make sure you discuss any questions you have with your healthcare provider. Document Revised: 07/30/2020 Document Reviewed: 07/30/2020 Elsevier Patient Education  2022 [employer].   The patient verbalized understanding of instructions, educational materials, and care plan provided today and agreed to receive a mailed copy of patient  instructions, educational materials, and care plan.  Telephone follow up appointment with care management team member scheduled for:  Tuesday June 30, 2021 at 11:30 am The patient has been provided with contact information for the care management team and has been advised to call with any health related questions or concerns.   [name], RN, BSN, [facility] RN Care Coordination- [place] [phone]: direct office [phone]: mobile

## 2021-05-27 NOTE — Chronic Care Management (AMB) (Signed)
Chronic Care Management   CCM RN Visit Note  05/27/2021 Name: Jason [name] MRN: [id number] DOB: Mar 24, 1941  Subjective: Jason [name] is a 80 y.o. year old male who is a primary care patient of [name], MD. The care management team was consulted for assistance with disease management and care coordination needs.    Engaged with patient by telephone for follow up visit in response to provider referral for case management and/or care coordination services.   Consent to Services:  The patient was given information about Chronic Care Management services, agreed to services, and gave verbal consent prior to initiation of services.  Please see initial visit note for detailed documentation.  Patient agreed to services and verbal consent obtained.   Assessment: Review of patient past medical history, allergies, medications, health status, including review of consultants reports, laboratory and other test data, was performed as part of comprehensive evaluation and provision of chronic care management services.   CCM Care Plan  Allergies  Allergen Reactions   Bactrim [Sulfamethoxazole-Trimethoprim] Rash    Outpatient Encounter Medications as of 05/27/2021  Medication Sig   Blood Glucose Calibration (ACCU-CHEK AVIVA) SOLN 1 Act by In Vitro route 3 (three) times daily.   blood glucose meter kit and supplies KIT Use to check blood sugar daily. DX: E11.   Blood Glucose Monitoring Suppl (ACCU-CHEK AVIVA PLUS) w/Device KIT 1 Act by Does not apply route 3 (three) times daily.   Continuous Blood Gluc Receiver (FREESTYLE LIBRE 2 READER) DEVI 1 Act by Does not apply route daily.   Continuous Blood Gluc Sensor (FREESTYLE LIBRE 2 SENSOR) MISC 1 Act by Does not apply route daily.   Glucagon (GVOKE HYPOPEN 2-PACK) 1 MG/0.2ML SOAJ Inject 1 Act into the skin daily as needed.   glucose blood (ACCU-CHEK AVIVA PLUS) test strip 1 each by Other route 3 (three) times daily. Use as instructed   insulin  glargine, 2 Unit Dial, (TOUJEO MAX SOLOSTAR) 300 UNIT/ML Solostar Pen Inject 30 Units into the skin daily.   Insulin Pen Needle 32G X 6 MM MISC 1 Act by Does not apply route in the morning and at bedtime.   metFORMIN (GLUCOPHAGE) 500 MG tablet TAKE 2 TABLETS TWICE DAILY WITH MEALS   Multiple Vitamins-Minerals (MULTIVITAMIN WITH MINERALS) tablet Take 1 tablet by mouth daily.   rivaroxaban (XARELTO) 20 MG TABS tablet Take 1 tablet (20 mg total) by mouth daily with supper.   Semaglutide,0.25 or 0.5MG/DOS, (OZEMPIC, 0.25 OR 0.5 MG/DOSE,) 2 MG/1.5ML SOPN Inject 1 mg into the skin once a week.   simvastatin (ZOCOR) 40 MG tablet Take 1 tablet (40 mg total) by mouth daily at 6 PM.   thiamine (VITAMIN B-1) 50 MG tablet Take 1 tablet (50 mg total) by mouth daily.   TRUEplus Safety Lancets 28G MISC 1 Act by Does not apply route in the morning and at bedtime.   vitamin C (ASCORBIC ACID) 500 MG tablet Take 1,000 mg by mouth daily.   Wound Dressings (MEDIHONEY WOUND/BURN DRESSING) GEL Apply to affected are 3 times a week, and cover with sterile dressing.   No facility-administered encounter medications on file as of 05/27/2021.    Patient Active Problem List   Diagnosis Date Noted   Nonrheumatic mitral valve stenosis 01/27/2021   Atherosclerosis of aorta ([place]) 09/30/2020   DOE (dyspnea on exertion) 09/30/2020   Abnormal electrocardiogram (ECG) (EKG) 09/30/2020   Thiamine deficiency 07/04/2020   Ulcer of toe, right, with necrosis of bone ([place])  Diabetic ulcer of toe of right foot associated with type 2 diabetes mellitus, with fat layer exposed ([place]) 04/18/2019   Malignant neoplasm of urinary bladder ([place]) 07/11/2018   Chronic idiopathic constipation 06/12/2018   Diabetic infection of right foot ([place]) 07/12/2017   Foot ulcer, right, with unspecified severity ([place]) 01/17/2017   Complex sleep apnea syndrome 01/12/2016   Diabetes mellitus type 2, uncontrolled, with complications ([place]) 03/54/6568   OSA  (obstructive sleep apnea) 12/18/2015   Benign prostatic hyperplasia 02/18/2014   Routine general medical examination at a health care facility 02/18/2014   PAD (peripheral artery disease) ([place]) 02/18/2014   DEGENERATIVE JOINT DISEASE 02/20/2008   Diabetes mellitus type 2 with atherosclerosis of arteries of extremities ([place]) 01/26/2008   Hyperlipidemia with target LDL less than 70 01/26/2008   Morbid obesity ([place]) 01/26/2008   Mitral valve disorder 01/26/2008   GERD 01/26/2008   Irritable bowel syndrome 01/26/2008    Conditions to be addressed/monitored:  HLD and DMII  Care Plan : Diabetes Type 2 (Adult)  Updates made by Knox Royalty, RN since 05/27/2021 12:00 AM     Problem: Disease Progression (Diabetes, Type 2)   Priority: Medium     Long-Range Goal: Disease Progression Prevented or Minimized   Start Date: 05/13/2021  Expected End Date: 05/13/2022  This Visit's Progress: On track  Recent Progress: On track  Priority: Medium  Note:   Objective:  Lab Results  Component Value Date   HGBA1C 9.3 (H) 05/07/2021   Lab Results  Component Value Date   CREATININE 1.21 05/07/2021   CREATININE 0.95 01/06/2021   CREATININE 1.11 09/30/2020   No results found for: EGFR Current Barriers:  Knowledge Deficits related to basic Diabetes pathophysiology and self care/management Knowledge Deficits related to medications used for management of diabetes- patient reports he often forgets to take his medications/ insulin: CCM Pharmacy team active Financial Constraints- reports could use assistance/ resources for managing utility bills: 05/27/21- confirms has spoken to [employer] and has been provided resources Unable to independently verbalize self-health management strategies for DM: diet, medications, routine home blood sugar monitoring-- will require ongoing reinforcement Does not adhere to provider recommendations re: blood sugar monitoring at home; medications- often  forgets to take: [place] team involved High Fall Risk: reports 6-plus falls over last 12 months, all without injury Case Manager Clinical Goal(s):  Over the next 12 months, patient will demonstrate ongoing adherence to/ improvement in prescribed treatment plan for diabetes self care/management as evidenced by:  Daily monitoring and recording of CBG twice daily  Improved adherence to ADA/ carb modified diet  Adherence to prescribed medication regimen  Contacting care providers for new or worsened symptoms, concerns or questions Interventions:  Collaboration with [name], MD regarding development and update of comprehensive plan of care as evidenced by provider attestation and co-signature Inter-disciplinary care team collaboration (see longitudinal plan of care) Review of patient status, including review of consultants reports, relevant laboratory and other test results, and medications completed Discussed current clinical condition with patient; he confirms that the nausea he reported to PCP office on 05/19/21 has now resolved; denies clinical concerns today Confirmed he received printed educational material previously mailed to him; has not had time to review and was encouraged to do so Continues to report that he has not been consistently taking insulin or any of his medications regularly; states he "sometimes just can't remember;" has not yet obtained a pill box and he has not placed written reminders  in his home to take his insulin q HS: patient was encouraged to begin taking steps to take medications as prescribed; will message CCM Pharmacist Mendel Ryder to update Has not been consistently monitoring/ recording blood sugars at home: reports he "just forgets;" he has several values from post-prandial readings and reports these as: [id number] Has not yet received FSL/ CGM prescription: reports he hopes he will get soon; states that PCP Pharmacy team "working" on affordability of CGM for him;  patient reports he believes obtaining CGM device will assist him in monitoring blood sugars at home more consistently Had a long discussion with patient that if he will begin monitoring/ recording blood sugars at home AND taking medications as prescribed, he will likely see improvement in his blood sugar levels and A1-C levels; while he acknowledges this, he states he "just has a hard time getting into a routine" Again encouraged patient to begin monitoring fasting blood sugars in am, and encouraged him to obtain newly prescribed CGM, and then to begin monitoring BID: fasting and 2-hours post-prandial; encouraged patient to write all recorded blood sugars from home down on paper for our future review together; reiterated previously provided education around goals for blood sugar values at home, fasting (100-120) and post-prandial (160-180-- 2 hours after a meal) Reiterated previously provided information/ education around basic education around complications/ risks of long-term high blood sugars and talked with him about long-term effects of high blood sugars, how cardiovascular risks are increased in setting of DM, reiterated need/ importance of keeping HLD under control  Patient reports that he has made 2 recent changes to dietary strategies he is following at home in an effort to improve: reports he has "completely cut out beer;" and that he has created a designated spot in one of his freezers at home for food that he is able to eat and has separated his allowed food from his wife's food-- these are great steps: positive reinforcement provided Confirmed patient has spoken to community resources care guide team about resources for utility costs and possibility of need for Meals on Wheels in future: he reports he has not yet taken action; encouraged him to do so as needed Confirmed patient has had no new/ recent falls since our last outreach 05/13/21: fall prevention reiterated and positive reinforcement  provided; continues using cane as indicated, but adds that he "doesn't have to use much at all" Reviewed scheduled/upcoming provider appointments including: 07/31/21 cardiology office visit/ [place]; 08/14/21 podiatry follow up visit Discussed plans with patient for ongoing care management follow up and provided patient with direct contact information for care management team Self-Care Activities UNABLE to independently verbalize dietary strategies for self-management of DM Self administers oral medications, insulin, and injectable Ozempic as prescribed- but admits often forgets medications Attends all scheduled provider appointments Checks blood sugars at home once-twice daily Attempts to adhere to prescribed ADA/carb modified- admits needs to begin making better dietary choices Patient Goals: Continue to learn about how having diabetes affects my health As soon as possible, please begin regular monitoring and writing down of your blood sugars at home: every morning before eating (fasting) and then again 2 hours after a meal Take your medications as prescribed: consider using a weekly pill box at home and consider making a written reminder to take your medications: especially your insulin at night We will review your blood sugars at home during each of our phone calls- please have these blood sugar values ready for review when we talk on June 30, 2021  Great job making small changes to your diet to decrease your blood sugar levels: today you have reported that you have stopped drinking beer and have made changes to the way you organize your food at home-- these are great strategies to help you begin decreasing your blood sugars-- this will also help your cholesterol levels Continue reviewing the educational material and the "Living Well with Diabetes" booklet: learn as much as you can about things you can do to decrease your blood sugar levels Talk to my doctor about my target A1C and work toward  meeting that target- we will talk more about this during our future phone calls  Follow Up Plan:  Telephone follow up appointment with care management team member scheduled for: Tuesday June 30, 2021 at 11:30 am The patient has been provided with contact information for the care management team and has been advised to call with any health related questions or concerns.      Plan: Telephone follow up appointment with care management team member scheduled for:  Tuesday June 30, 2021 at 11:30 am The patient has been provided with contact information for the care management team and has been advised to call with any health related questions or concerns  [name], RN, BSN, [place] [phone]: direct office [phone]: mobile

## 2021-05-27 NOTE — Telephone Encounter (Signed)
   Telephone encounter was:  Unsuccessful.  05/27/2021 Name: Jason [name] MRN: [id number] DOB: Sep 15, 1941  Unsuccessful outbound call made today to assist with:   food and utility assistance.  Outreach Attempt:  2nd Attempt  A HIPAA compliant voice message was left requesting a return call.  Instructed patient to call back at [phone].  Nyemah Watton, AAS Paralegal, [facility]  [address], [place] [address] ??[email or web address]  ?? [phone]   [email or web address]

## 2021-05-28 ENCOUNTER — Telehealth: Payer: Self-pay

## 2021-05-28 NOTE — Telephone Encounter (Signed)
   Telephone encounter was:  Successful.  05/28/2021 Name: Jason [name] MRN: [id number] DOB: [birth year]  Jason [name] is a 80 y.o. year old male who is a primary care patient of [name], MD . The community resource team was consulted for assistance with  food and utilities.  Care guide performed the following interventions: Spoke with patient to confirm he had received emailed resources as he requested when I verified his email address. Patient stated that he did not know how to open his email and requested that I mail the information to him.  Follow Up Plan:  No further follow up planned at this time. The patient has been provided with needed resources. and I will update chart notes when patient received resource letter since it will be at least another week.  Arantxa Piercey, AAS Paralegal, [facility]  [address], [place] [address] ??[email or web address]  ?? [phone]   [email or web address]

## 2021-06-01 ENCOUNTER — Other Ambulatory Visit: Payer: Self-pay

## 2021-06-01 ENCOUNTER — Ambulatory Visit: Payer: Medicare PPO | Admitting: Pharmacist

## 2021-06-01 DIAGNOSIS — E785 Hyperlipidemia, unspecified: Secondary | ICD-10-CM

## 2021-06-01 DIAGNOSIS — I700 Atherosclerosis of aorta: Secondary | ICD-10-CM

## 2021-06-01 DIAGNOSIS — Z794 Long term (current) use of insulin: Secondary | ICD-10-CM

## 2021-06-01 DIAGNOSIS — I4892 Unspecified atrial flutter: Secondary | ICD-10-CM

## 2021-06-01 DIAGNOSIS — E119 Type 2 diabetes mellitus without complications: Secondary | ICD-10-CM

## 2021-06-01 NOTE — Progress Notes (Signed)
Chronic Care Management Pharmacy Note  06/02/2021 Name:  Jason [name] MRN:  [id number] DOB:  1941/08/12  Summary: -Pt endorses compliance with Toujeo (20 units daily) and Ozempic, however he only had 1 sample of Ozempic from months ago so it is very unlikely he has been taking this as prescribed. We were working on [employer] pt assistance but pt never brought income documents to complete the application. -Fasting BG is at goal, post-prandial BG in 200-300 range -Pt declines to pay for Xarelto (>$250 due to deductible, $45 per month once deductible met however pt will not pay initial deductible cost)  Recommendations/Changes made from today's visit: -Advised pt to bring income documents to office to complete Ozempic pt assistance. Emphasized importance of compliance with Ozempic to target post-prandial BG and lower A1c -Consider switching Xarelto to warfarin - defer to cardiology   Subjective: IBAN UTZ is an 80 y.o. year old male who is a primary patient of Jason Lima, MD.  The CCM team was consulted for assistance with disease management and care coordination needs.    Engaged with patient by telephone for follow up visit in response to provider referral for pharmacy case management and/or care coordination services.   Consent to Services:  The patient was given information about Chronic Care Management services, agreed to services, and gave verbal consent prior to initiation of services.  Please see initial visit note for detailed documentation.   Patient Care Team: Jason Lima, MD as PCP - General (Internal Medicine) Minus Breeding, MD as PCP - Cardiology (Cardiology) Charlton Haws, [facility] as Pharmacist (Pharmacist) Knox Royalty, RN as Case Manager  Recent office visits: 05/07/21 Dr Ronnald Ramp OV: chronic f/u; A1c up to 9.3. Pt noncompliant with meds. Advised to restart insulin (Toujeo).  01/06/21 Dr Ronnald Ramp OV: DM f/u, A1c improved to 8.2%. No changes.  09/30/20  Dr Ronnald Ramp OV: DM f/u, started Toujeo and Gvoke.  Recent consult visits: 01/01/21 Dr Percival Spanish (cardiology): EKG c/w A flutter, has been on low-dose Xarelto. Changed to full dose 20 mg, and stop aspirin. Trouble with finger sticks?  11/03/20 RD Levie Heritage: Ozempic pen instruction  Hospital visits: None in previous 6 months  Objective:  Lab Results  Component Value Date   CREATININE 1.21 05/07/2021   BUN 13 05/07/2021   GFR 57.09 (L) 05/07/2021   GFRNONAA >60 05/17/2019   GFRAA >60 05/17/2019   NA 136 05/07/2021   K 4.6 05/07/2021   CALCIUM 9.3 05/07/2021   CO2 27 05/07/2021   GLUCOSE 359 (H) 05/07/2021    Lab Results  Component Value Date/Time   HGBA1C 9.3 (H) 05/07/2021 01:54 PM   HGBA1C 8.2 (H) 01/06/2021 11:51 AM   GFR 57.09 (L) 05/07/2021 01:54 PM   GFR 76.50 01/06/2021 11:51 AM   MICROALBUR 2.1 05/21/2020 02:58 PM   MICROALBUR 11.3 (H) 04/18/2019 10:43 AM    Last diabetic Eye exam:  Lab Results  Component Value Date/Time   HMDIABEYEEXA No Retinopathy 04/29/2021 12:00 AM    Last diabetic Foot exam:  Lab Results  Component Value Date/Time   HMDIABFOOTEX done 08/09/2014 12:00 AM     Lab Results  Component Value Date   CHOL 147 05/21/2020   HDL 49 05/21/2020   LDLCALC 79 05/21/2020   TRIG 100 05/21/2020   CHOLHDL 3.0 05/21/2020    Hepatic Function Latest Ref Rng & Units 05/21/2020 05/17/2019 04/18/2019  Total Protein 6.1 - 8.1 g/dL 6.7 6.5 7.1  Albumin 3.5 - 5.0 g/dL -  3.6 4.1  AST 10 - 35 U/L '12 19 12  ' ALT 9 - 46 U/L '11 18 13  ' Alk Phosphatase 38 - 126 U/L - 32(L) 43  Total Bilirubin 0.2 - 1.2 mg/dL 0.6 1.5(H) 0.9  Bilirubin, Direct 0.0 - 0.2 mg/dL 0.2 - -    Lab Results  Component Value Date/Time   TSH 0.78 05/07/2021 01:54 PM   TSH 0.85 04/18/2019 10:35 AM    CBC Latest Ref Rng & Units 09/30/2020 06/30/2020 05/21/2020  WBC 4.0 - 10.5 K/uL 4.1 4.6 4.9  Hemoglobin 13.0 - 17.0 g/dL 13.1 12.6(L) 12.4(L)  Hematocrit 39.0 - 52.0 % 39.1 38.3(L) 37.0(L)   Platelets 150.0 - 400.0 K/uL 165.0 159 146    No results found for: VD25OH  Clinical ASCVD: Yes  - PAD The 10-year ASCVD risk score Mikey Bussing DC Jr., et al., 2013) is: 40%   Values used to calculate the score:     Age: 17 years     Sex: Male     Is Non-Hispanic African American: Yes     Diabetic: Yes     Tobacco smoker: Yes     Systolic Blood Pressure: 300 mmHg     Is BP treated: No     HDL Cholesterol: 49 mg/dL     Total Cholesterol: 147 mg/dL    Depression screen [facility] 2/9 05/13/2021 10/20/2020 12/31/2019  Decreased Interest 0 1 2  Down, Depressed, Hopeless 1 2 0  PHQ - 2 Score '1 3 2  ' Altered sleeping - - 1  Tired, decreased energy - - 1  Change in appetite - - 1  Feeling bad or failure about yourself  - - 0  Trouble concentrating - - 1  Moving slowly or fidgety/restless - - 1  Suicidal thoughts - - 0  PHQ-9 Score - - 7  Difficult doing work/chores - - Not difficult at all  Some recent data might be hidden    CHA2DS2-VASc Score = 5  The patient's score is based upon: CHF History: No HTN History: Yes Diabetes History: Yes Stroke History: No Vascular Disease History: Yes Age Score: 2 Gender Score: 0      Social History   Tobacco Use  Smoking Status Former   Packs/day: 0.10   Years: 5.00   Pack years: 0.50   Types: Cigarettes   Quit date: 05/10/2015   Years since quitting: 6.0  Smokeless Tobacco Never   BP Readings from Last 3 Encounters:  05/07/21 128/62  01/28/21 128/60  01/06/21 136/84   Pulse Readings from Last 3 Encounters:  05/07/21 77  01/28/21 66  01/06/21 64   Wt Readings from Last 3 Encounters:  05/07/21 295 lb (133.8 kg)  01/28/21 300 lb (136.1 kg)  01/06/21 298 lb (135.2 kg)   BMI Readings from Last 3 Encounters:  05/07/21 32.41 kg/m  01/28/21 32.96 kg/m  01/06/21 32.74 kg/m    Assessment/Interventions: Review of patient past medical history, allergies, medications, health status, including review of consultants reports, laboratory  and other test data, was performed as part of comprehensive evaluation and provision of chronic care management services.   SDOH:  (Social Determinants of Health) assessments and interventions performed: Yes   SDOH Screenings   Alcohol Screen: Not on file  Depression (PHQ2-9): Low Risk    PHQ-2 Score: 1  Financial Resource Strain: Medium Risk   Difficulty of Paying Living Expenses: Somewhat hard  Food Insecurity: Food Insecurity Present   Worried About Running Out of Food in the Last  Year: Sometimes true   Ran Out of Food in the Last Year: Sometimes true  Housing: Medium Risk   Last Housing Risk Score: 1  Physical Activity: Not on file  Social Connections: Not on file  Stress: Not on file  Tobacco Use: Medium Risk   Smoking Tobacco Use: Former   Smokeless Tobacco Use: Never  Transportation Needs: No Transportation Needs   Lack of Transportation (Medical): No   Lack of Transportation (Non-Medical): No    CCM Care Plan  Allergies  Allergen Reactions   Bactrim [Sulfamethoxazole-Trimethoprim] Rash    Medications Reviewed Today     Reviewed by Charlton Haws, [facility] (Pharmacist) on 06/01/21 at 1420  Med List Status: <None>   Medication Order Taking? Sig Documenting Provider Last Dose Status Informant  Blood Glucose Calibration (ACCU-CHEK AVIVA) SOLN [id number] Yes 1 Act by In Vitro route 3 (three) times daily. Jason Lima, MD Taking Active   blood glucose meter kit and supplies KIT [id number] Yes Use to check blood sugar daily. DX: E11. Hoyt Koch, MD Taking Active   Blood Glucose Monitoring Suppl (ACCU-CHEK AVIVA PLUS) w/Device KIT [id number] Yes 1 Act by Does not apply route 3 (three) times daily. Jason Lima, MD Taking Active   Continuous Blood Gluc Receiver (FREESTYLE LIBRE 2 READER) DEVI [id number] Yes 1 Act by Does not apply route daily. Jason Lima, MD Taking Active   Continuous Blood Gluc Sensor (FREESTYLE LIBRE 2 SENSOR) [place] [id number] Yes 1 Act  by Does not apply route daily. Jason Lima, MD Taking Active   Glucagon (GVOKE HYPOPEN 2-PACK) 1 MG/0.2ML Darden [name] [id number] Yes Inject 1 Act into the skin daily as needed. Jason Lima, MD Taking Active   glucose blood (ACCU-CHEK AVIVA PLUS) test strip [id number] Yes 1 each by Other route 3 (three) times daily. Use as instructed Jason Lima, MD Taking Active   insulin glargine, 2 Unit Dial, (TOUJEO MAX SOLOSTAR) 300 UNIT/ML Solostar Pen [id number] Yes Inject 30 Units into the skin daily.  Patient taking differently: Inject 20 Units into the skin daily.   Jason Lima, MD Taking Active   Insulin Pen Needle 32G X 6 MM MISC [id number] Yes 1 Act by Does not apply route in the morning and at bedtime. Jason Lima, MD Taking Active   metFORMIN (GLUCOPHAGE) 500 MG tablet [id number] Yes TAKE 2 TABLETS TWICE DAILY WITH MEALS Jason Lima, MD Taking Active   Multiple Vitamins-Minerals (MULTIVITAMIN WITH MINERALS) tablet [id number] Yes Take 1 tablet by mouth daily. [provider] Taking Active Self  rivaroxaban (XARELTO) 20 MG TABS tablet [id number] No Take 1 tablet (20 mg total) by mouth daily with supper.  Patient not taking: Reported on 06/01/2021   Minus Breeding, MD Not Taking Active   Semaglutide,0.25 or 0.5MG/DOS, (OZEMPIC, 0.25 OR 0.5 MG/DOSE,) 2 MG/1.5ML SOPN [id number] No Inject 1 mg into the skin once a week.  Patient not taking: Reported on 06/01/2021   Jason Lima, MD Not Taking Active   simvastatin (ZOCOR) 40 MG tablet [id number] Yes Take 1 tablet (40 mg total) by mouth daily at 6 PM. Jason Lima, MD Taking Active   thiamine (VITAMIN B-1) 50 MG tablet [id number] Yes Take 1 tablet (50 mg total) by mouth daily. Jason Lima, MD Taking Active   TRUEplus Safety Lancets 28G [place] [id number] Yes 1 Act by Does not apply route in the morning and at bedtime. Jason Lima, MD Taking Active   vitamin C (  ASCORBIC ACID) 500 MG tablet [id number] Yes Take 1,000 mg by mouth daily.  [provider] Taking Active Self  Wound Dressings (MEDIHONEY WOUND/BURN DRESSING) GEL [id number] Yes Apply to affected are 3 times a week, and cover with sterile dressing. Jason Lima, MD Taking Active             Patient Active Problem List   Diagnosis Date Noted   Nonrheumatic mitral valve stenosis 01/27/2021   Atherosclerosis of aorta ([place]) 09/30/2020   DOE (dyspnea on exertion) 09/30/2020   Abnormal electrocardiogram (ECG) (EKG) 09/30/2020   Thiamine deficiency 07/04/2020   Ulcer of toe, right, with necrosis of bone ([place])    Diabetic ulcer of toe of right foot associated with type 2 diabetes mellitus, with fat layer exposed ([place]) 04/18/2019   Malignant neoplasm of urinary bladder ([place]) 07/11/2018   Chronic idiopathic constipation 06/12/2018   Diabetic infection of right foot ([place]) 07/12/2017   Foot ulcer, right, with unspecified severity ([place]) 01/17/2017   Complex sleep apnea syndrome 01/12/2016   Diabetes mellitus type 2, uncontrolled, with complications ([place]) 01/75/1025   OSA (obstructive sleep apnea) 12/18/2015   Benign prostatic hyperplasia 02/18/2014   Routine general medical examination at a health care facility 02/18/2014   PAD (peripheral artery disease) ([place]) 02/18/2014   DEGENERATIVE JOINT DISEASE 02/20/2008   Diabetes mellitus type 2 with atherosclerosis of arteries of extremities ([place]) 01/26/2008   Hyperlipidemia with target LDL less than 70 01/26/2008   Morbid obesity ([place]) 01/26/2008   Mitral valve disorder 01/26/2008   GERD 01/26/2008   Irritable bowel syndrome 01/26/2008    Immunization History  Administered Date(s) Administered   Fluad Quad(high Dose 65+) 07/19/2019, 09/30/2020   Influenza Split 08/30/2011, 08/22/2012, 09/10/2015   Influenza Whole 10/24/2007, 08/26/2009   Influenza, High Dose Seasonal PF 07/12/2017   Influenza,inj,Quad PF,6+ Mos 08/20/2013, 08/09/2014, 08/23/2016   Influenza-Unspecified 09/12/2015, 10/18/2018    PFIZER(Purple Top)SARS-COV-2 Vaccination 12/04/2019, 01/03/2020, 08/28/2020   Pneumococcal Conjugate-13 02/18/2014   Pneumococcal Polysaccharide-23 08/06/2008, 09/13/2019   Td 02/03/2009   Tdap 09/13/2019   Zoster, Live 08/09/2014    Conditions to be addressed/monitored:  Hyperlipidemia, PAD, CAD, Diabetes and Atrial Flutter   Care Plan : [place]  Updates made by Charlton Haws, [place] since 06/02/2021 12:00 AM     Problem: Hyperlipidemia, PAD, CAD, Diabetes and Atrial Flutter   Priority: High     Long-Range Goal: Disease management   Start Date: 02/05/2021  Expected End Date: 08/08/2021  This Visit's Progress: Not on track  Recent Progress: On track  Priority: High  Note:   Current Barriers:  Unable to independently afford treatment regimen Unable to independently monitor therapeutic efficacy Unable to achieve control of diabetes  Unable to self administer medications as prescribed Does not adhere to prescribed medication regimen Does not contact provider office for questions/concerns  Pharmacist Clinical Goal(s):  Patient will verbalize ability to afford treatment regimen achieve adherence to monitoring guidelines and medication adherence to achieve therapeutic efficacy achieve control of diabetes as evidenced by improved A1c achieve ability to self administer medications as prescribed through use of insulin pens as evidenced by patient report adhere to prescribed medication regimen as evidenced by patient report contact provider office for questions/concerns as evidenced notation of same in electronic health record through collaboration with PharmD and provider.   Interventions: 1:1 collaboration with Jason Lima, MD regarding development and update of comprehensive plan of care as evidenced by provider attestation and co-signature Inter-disciplinary care team collaboration (  see longitudinal plan of care) Comprehensive medication review performed;  medication list updated in electronic medical record  Hyperlipidemia (LDL goal < 70) -Not ideally controlled - LDL is close to goal, but not < 70. Pt endorses compliance and denies side effects -hx PAD, aortic atherosclerosis -Current treatment: Simvastatin 40 mg daily   Aspirin 81 mg daily -Medications previously tried: none  -Patient may benefit from switching to a higher intensity statin in the future given hx of PAD, aortic atherosclerosis. Will wait for results of next lipid panel before making changes.  Diabetes (A1c goal <8%) -Uncontrolled - A1c recently worsened 8.2 > 9.3, likely d/t noncompliance w/ Ozempic; today pt reports he is taking Toujeo 20 units daily; he is not sure if he is taking Ozempic, he asked to clarify multiple times if that is the once a week pen -hx diabetic food infection, R toe amputations (all toes) -Current medications: Meformin 500 mg - 2 tab BID Toujeo Max 20 units daily - [employer]i PAP Ozempic 1 mg once a week - NOVO Cares pending Gvoke Hypopen Freestyle Libre CGM - not approved yet -Medications previously tried:  Economist, Catering manager, Farxiga, glimepiride -Current home glucose readings fasting glucose: 81-132 post prandial glucose: 200-300 -Denies hypoglycemic symptoms -Fasting BG is at goal, post-prandial BG very high; counseled that Ozempic targets post-prandial BG and it is very important he is able to get a consistent supply of this; Novo Cares PAP is almost ready to submit but still need pt's proof of income, he reports his wife will bring it to office ASAP to complete application -F/U weekly until Ozempic PAP is approved  Atrial Flutter (Goal: prevent stroke and major bleeding) -Dx 12/2020 from EKG. Pt is asymptomatic. -Uncontrolled - pt is not taking Xarelto because he could not afford the copay (>$250) -hx mitral valve stenosis -CHADSVASC: 5 -Current treatment: Rate control: none Anticoagulation: Xarelto 20 mg daily PM (not  taking) -Medications previously tried: none  -Assessed patient finances - it is likely that patient has a deductible to meet and that is why Xarelto is so expensive. Per formulary it is a Tier 3 product so should be ~$47 per month once deductible is met. However he is not willing to pay the initial $250 to meet deductible. He will not qualify for Xarelto patient assistance until he spends 4% of income on Rx drugs in 2022 (would need to spend >$2600) -Pt may need to switch to warfarin if he cannot afford/is not willing to pay for Xarelto/Eliquis. Will defer to cardiologist.  Health Maintenance -Vaccine gaps: Shingrix, covid booster -Counseled to get Shngrix, covid booster  Patient Goals/Self-Care Activities Patient will:  - take medications as prescribed -focus on medication adherence by pill box -check glucose twice a day, document, and provide at future appointments -collaborate with provider on medication access solutions (bring income documents to office to complete Ozempic pt assistance) -target a minimum of 150 minutes of moderate intensity exercise weekly       Medication Assistance:  Toujeo - [employer] PAP approved through 11/14/21 Ozempic - [employer] PAP in process, awaiting pt income documents Xarelto - pt will not qualify for PAP this year due to failure to meet 4% OOP spend criteria (would need to spend >$2600)  Compliance/Adherence/Medication fill history: Care Gaps: Shingrix Covid booster (due 11/28/20) Colonoscopy (due 05/10/18)  Star-Rating Drugs: Simvastatin - LF 08/18/20 x 90 ds ([employer]) Metformin - LF 08/18/20 x 90 ds ([employer])  Patient's preferred pharmacy is:  [facility] [phone] - [place], [place] -  [place] [address] [place] [address] Phone: [phone] Fax: [phone]  [facility] - [place], [place] [address] Phone: [phone] Fax: [phone]  Uses pill box? No - keeps all meds in 1 container Pt endorses 90% compliance (not taking Ozempic or Xarelto)  We discussed: Current pharmacy is preferred with insurance plan and patient is satisfied with pharmacy services Patient decided to: Continue current medication management strategy  Care Plan and Follow Up Patient Decision:  Patient agrees to Care Plan and Follow-up.  Plan: Telephone follow up appointment with care management team member scheduled for:  1 month  Charlene Brooke, PharmD, [place], CPP Clinical Pharmacist [facility] at [facility] [phone]

## 2021-06-02 NOTE — Patient Instructions (Signed)
Visit Information  Phone number for Pharmacist: [phone]   Goals Addressed             This Visit's Progress    Manage My Medicine       Timeframe:  Long-Range Goal Priority:  High Start Date:      02/05/21                       Expected End Date:  12/15/21                 Follow Up Date Aug 2022   - call for medicine refill 2 or 3 days before it runs out - call if I am sick and can't take my medicine - keep a list of all the medicines I take; vitamins and herbals too - use a pillbox to sort medicine  - Let the doctors know when you cannot afford or you do not know how to take a medication -Bring income documents to [place] office to complete Ozempic pt assistance   Why is this important?   These steps will help you keep on track with your medicines.   Notes:         The patient verbalized understanding of instructions, educational materials, and care plan provided today and declined offer to receive copy of patient instructions, educational materials, and care plan.  Telephone follow up appointment with pharmacy team member scheduled for: 1 month  Charlene Brooke, PharmD, [place], CPP Clinical Pharmacist [facility] at [facility] [phone]

## 2021-06-04 ENCOUNTER — Telehealth: Payer: Self-pay

## 2021-06-04 NOTE — Telephone Encounter (Signed)
   Telephone encounter was:  Successful.  06/04/2021 Name: [name] MRN: [id number] DOB: Jul 25, 1941  Jason [name] is a 80 y.o. year old male who is a primary care patient of [name], MD . The community resource team was consulted for assistance with Food Insecurity and utilities  Care guide performed the following interventions: Spoke with patient to confirm receipt of mailed resources.  He has not checked his mail but will check it today and call me back.  He has my name and number..  Follow Up Plan:  No further follow up planned at this time. The patient has been provided with needed resources.   Yvon Mccord, AAS Paralegal, [facility]  [address], [place] [address] ??[email or web address]  ?? [phone]   [email or web address]

## 2021-06-15 ENCOUNTER — Telehealth: Payer: Self-pay | Admitting: Pharmacist

## 2021-06-15 NOTE — Telephone Encounter (Signed)
Received copies of income documents for patient and wife for [employer] application.  Attached documentation to Pt assistance forms and faxed to [facility]: [phone].  Will await determination.

## 2021-06-16 ENCOUNTER — Ambulatory Visit: Payer: Medicare PPO | Admitting: Internal Medicine

## 2021-06-16 ENCOUNTER — Encounter: Payer: Self-pay | Admitting: Internal Medicine

## 2021-06-16 ENCOUNTER — Other Ambulatory Visit: Payer: Self-pay

## 2021-06-16 VITALS — BP 120/68 | HR 75 | Temp 98.8°F | Ht >= 80 in | Wt 289.0 lb

## 2021-06-16 DIAGNOSIS — K047 Periapical abscess without sinus: Secondary | ICD-10-CM

## 2021-06-16 DIAGNOSIS — E118 Type 2 diabetes mellitus with unspecified complications: Secondary | ICD-10-CM | POA: Diagnosis not present

## 2021-06-16 DIAGNOSIS — L259 Unspecified contact dermatitis, unspecified cause: Secondary | ICD-10-CM

## 2021-06-16 DIAGNOSIS — E1165 Type 2 diabetes mellitus with hyperglycemia: Secondary | ICD-10-CM

## 2021-06-16 DIAGNOSIS — IMO0002 Reserved for concepts with insufficient information to code with codable children: Secondary | ICD-10-CM

## 2021-06-16 MED ORDER — PREDNISONE 10 MG PO TABS: ORAL_TABLET | ORAL | 0 refills | Status: DC

## 2021-06-16 MED ORDER — AMOXICILLIN-POT CLAVULANATE 875-125 MG PO TABS
1.0000 | ORAL_TABLET | Freq: Two times a day (BID) | ORAL | 0 refills | Status: DC
Start: 2021-06-16 — End: 2021-08-17

## 2021-06-16 MED ORDER — TRIAMCINOLONE ACETONIDE 0.1 % EX CREA: 1.0000 "application " | TOPICAL_CREAM | Freq: Two times a day (BID) | CUTANEOUS | 0 refills | Status: DC

## 2021-06-16 NOTE — Assessment & Plan Note (Signed)
Lab Results  Component Value Date   HGBA1C 9.3 (H) 05/07/2021   Uncontrolled, pt encouraged for better med compliane and to continue current medical treatment insulin glargine, metformin, semaglutide

## 2021-06-16 NOTE — Progress Notes (Signed)
Patient ID: Jason [name], male   DOB: 06-Nov-1941, 80 y.o.   MRN: [id number]        Chief Complaint: follow up lower incisor related dental pain, rash to arms, and dm       HPI:  GRACIN MCPARTLAND is a 80 y.o. male here with 4 days onset lower front incisor related gum red, tender, swelling without frank drainage or ulcer, and denies other jaw sweling, fever, chills, HA, neck pain, ST, cough and Pt denies chest pain, increased sob or doe, wheezing, orthopnea, PND, increased LE swelling, palpitations, dizziness or syncope.   Pt denies polydipsia, polyuria, or new focal neuro s/s.  Has not seen dental in some times.  A few yrs ago all upper teeth were pulled and has denture.  His lower partial is broken.  Plans to f/u with dental, just not done yet.  Also has marked itchy bumpy rash to the bilateral post arms after working in the yard over the weekend.  No other rash,, sweling, fever, ulcer.        Wt Readings from Last 3 Encounters:  06/16/21 289 lb (131.1 kg)  05/07/21 295 lb (133.8 kg)  01/28/21 300 lb (136.1 kg)   BP Readings from Last 3 Encounters:  06/16/21 120/68  05/07/21 128/62  01/28/21 128/60         Past Medical History:  Diagnosis Date   Anxiety    Asthma    in past, no inhaler now   Benign neoplasm of colon    Bone infection ([place])    Cancer (HCC)    bladder cancer   COPD (chronic obstructive pulmonary disease) (HCC)    Depression    Diverticulosis of colon (without mention of hemorrhage)    DJD (degenerative joint disease)    Esophageal reflux    Glaucoma    Headache    Hidradenitis    History of BPH    History of gynecomastia    Unilateral   Hyperlipidemia    Irritable bowel syndrome    Memory loss    Mitral stenosis    mild-moderate MS 01/2016   MVP (mitral valve prolapse)    Obesity, unspecified    Pneumonia    Sleep apnea    does not use  cpap or bipap .. no study  ever   Type II or unspecified type diabetes mellitus without mention of complication, not  stated as uncontrolled    Unspecified venous (peripheral) insufficiency    Past Surgical History:  Procedure Laterality Date   ACHILLES TENDON SURGERY Right 05/17/2019   Procedure: ACHILLES LENGTHENING/KIDNER;  Surgeon: Evelina Bucy, DPM;  Location: [place];  Service: Podiatry;  Laterality: Right;   AMPUTATION  08/09/2012   Procedure: AMPUTATION DIGIT;  Surgeon: Newt Minion, MD;  Location: [place];  Service: Orthopedics;  Laterality: Right;  Amputation right Great Toe MTP Joint   AMPUTATION  10/17/2012   Procedure: AMPUTATION DIGIT;  Surgeon: Newt Minion, MD;  Location: [place];  Service: Orthopedics;  Laterality: Right;  Right Foot 3rd Toe Amputation at Metatarsophalangeal Joint   AMPUTATION TOE Right 05/17/2019   Procedure: AMPUTATION TOE Metatarsal Phalangeal Joint   FOURTH AND FIFTH, FILLETED TOE FLAP;  Surgeon: Evelina Bucy, DPM;  Location: [place];  Service: Podiatry;  Laterality: Right;   CHOLECYSTECTOMY N/A 06/14/2018   Procedure: LAPAROSCOPIC CHOLECYSTECTOMY WITH INTRAOPERATIVE CHOLANGIOGRAM;  Surgeon: Kieth Brightly Arta Bruce, MD;  Location: [facility];  Service: General;  Laterality: N/A;   CIRCUMCISION  06/2100   For Phimosis - Dr Hartley Barefoot   COLON SURGERY     CYSTOSCOPY W/ RETROGRADES Bilateral 07/07/2018   Procedure: CYSTOSCOPY WITH BILATERAL RETROGRADE PYELOGRAM;  Surgeon: Ardis Hughs, MD;  Location: [facility];  Service: Urology;  Laterality: Bilateral;   KNEE ARTHROSCOPY     LAPAROSCOPIC LYSIS OF ADHESIONS  06/14/2018   Procedure: LAPAROSCOPIC LYSIS OF ADHESIONS;  Surgeon: Kieth Brightly Arta Bruce, MD;  Location: [facility];  Service: General;;   LAPAROTOMY  07/20/11   LASER ABLATION  06/2009   Left Greater Saphenous vein -Dr Elicia Lamp OSTEOTOMY Right 05/17/2019   Procedure: METATARSLSECTOMY FOURTH DIGIT;  Surgeon: Evelina Bucy, DPM;  Location: [place];  Service: Podiatry;  Laterality: Right;   MULTIPLE TOOTH EXTRACTIONS     TOE AMPUTATION  06/2009   x3Right foot second toe -Dr  Beola Cord   TONSILLECTOMY     TRANSURETHRAL RESECTION OF BLADDER TUMOR N/A 07/07/2018   Procedure: TRANSURETHRAL RESECTION OF BLADDER TUMOR (TURBT) WITH POST OP INSTILLATION OF GEMCITABIN;  Surgeon: Ardis Hughs, MD;  Location: [facility];  Service: Urology;  Laterality: N/A;   WOUND DEBRIDEMENT Right 07/25/2019   Procedure: Right foot wound debridement with application of skin graft substitute;  Surgeon: Evelina Bucy, DPM;  Location: [facility];  Service: Podiatry;  Laterality: Right;    reports that he quit smoking about 6 years ago. His smoking use included cigarettes. He has a 0.50 pack-year smoking history. He has never used smokeless tobacco. He reports previous alcohol use of about 7.0 standard drinks of alcohol per week. He reports that he does not use drugs. family history includes Cancer in his father; Diabetes in his father; High blood pressure in an other family member; Prostate cancer in his father; Stroke in his father. Allergies  Allergen Reactions   Bactrim [Sulfamethoxazole-Trimethoprim] Rash   Current Outpatient Medications on File Prior to Visit  Medication Sig Dispense Refill   Blood Glucose Calibration (ACCU-CHEK AVIVA) SOLN 1 Act by In Vitro route 3 (three) times daily. 1 each 2   blood glucose meter kit and supplies KIT Use to check blood sugar daily. DX: E11. 1 each 0   Blood Glucose Monitoring Suppl (ACCU-CHEK AVIVA PLUS) w/Device KIT 1 Act by Does not apply route 3 (three) times daily. 1 kit 2   Continuous Blood Gluc Receiver (FREESTYLE LIBRE 2 READER) DEVI 1 Act by Does not apply route daily. 2 each 5   Continuous Blood Gluc Sensor (FREESTYLE LIBRE 2 SENSOR) MISC 1 Act by Does not apply route daily. 2 each 5   Glucagon (GVOKE HYPOPEN 2-PACK) 1 MG/0.2ML SOAJ Inject 1 Act into the skin daily as needed. 2 mL 5   glucose blood (ACCU-CHEK AVIVA PLUS) test strip 1 each by Other route 3 (three) times daily. Use as instructed 300 each 2   insulin glargine, 2 Unit Dial, (TOUJEO  MAX SOLOSTAR) 300 UNIT/ML Solostar Pen Inject 30 Units into the skin daily. (Patient taking differently: Inject 20 Units into the skin daily.) 9 mL 1   Insulin Pen Needle 32G X 6 MM MISC 1 Act by Does not apply route in the morning and at bedtime. 100 each 3   metFORMIN (GLUCOPHAGE) 500 MG tablet TAKE 2 TABLETS TWICE DAILY WITH MEALS 360 tablet 0   Multiple Vitamins-Minerals (MULTIVITAMIN WITH MINERALS) tablet Take 1 tablet by mouth daily.     rivaroxaban (XARELTO) 20 MG TABS tablet Take 1 tablet (20 mg total) by mouth daily with supper.  90 tablet 3   Semaglutide,0.25 or 0.5MG/DOS, (OZEMPIC, 0.25 OR 0.5 MG/DOSE,) 2 MG/1.5ML SOPN Inject 1 mg into the skin once a week. 9 mL 1   simvastatin (ZOCOR) 40 MG tablet Take 1 tablet (40 mg total) by mouth daily at 6 PM. 90 tablet 1   thiamine (VITAMIN B-1) 50 MG tablet Take 1 tablet (50 mg total) by mouth daily. 90 tablet 1   TRUEplus Safety Lancets 28G MISC 1 Act by Does not apply route in the morning and at bedtime. 200 each 3   vitamin C (ASCORBIC ACID) 500 MG tablet Take 1,000 mg by mouth daily.     Wound Dressings (MEDIHONEY WOUND/BURN DRESSING) GEL Apply to affected are 3 times a week, and cover with sterile dressing. 15 mL 2   No current facility-administered medications on file prior to visit.        ROS:  All others reviewed and negative.  Objective        PE:  BP 120/68 (BP Location: Left Arm, Patient Position: Sitting, Cuff Size: Normal)   Pulse 75   Temp 98.8 F (37.1 C) (Oral)   Ht '6\' 8"'  (2.032 m)   Wt 289 lb (131.1 kg)   SpO2 96%   BMI 31.75 kg/m                 Constitutional: Pt appears in NAD               HENT: Head: NCAT.                Right Ear: External ear normal.                 Left Ear: External ear normal.                Eyes: . Pupils are equal, round, and reactive to light. Conjunctivae and EOM are normal; dental - 2 front lower incisors with severe caries, gum is red, tender, swelling without fluctuance or  drainage.               Nose: without d/c or deformity               Neck: Neck supple. Gross normal ROM               Cardiovascular: Normal rate and regular rhythm.                 Pulmonary/Chest: Effort normal and breath sounds without rales or wheezing.                               Neurological: Pt is alert. At baseline orientation, motor grossly intact               Skin: LE edema - none, bilateral post arms with typical contact like dermatitis nontender, no other rash noted               Psychiatric: Pt behavior is normal without agitation   Micro: none  Cardiac tracings I have personally interpreted today:  none  Pertinent Radiological findings (summarize): none   Lab Results  Component Value Date   WBC 4.1 09/30/2020   HGB 13.1 09/30/2020   HCT 39.1 09/30/2020   PLT 165.0 09/30/2020   GLUCOSE 359 (H) 05/07/2021   CHOL 147 05/21/2020   TRIG 100 05/21/2020   HDL 49 05/21/2020   LDLCALC 79 05/21/2020   ALT  11 05/21/2020   AST 12 05/21/2020   NA 136 05/07/2021   K 4.6 05/07/2021   CL 102 05/07/2021   CREATININE 1.21 05/07/2021   BUN 13 05/07/2021   CO2 27 05/07/2021   TSH 0.78 05/07/2021   PSA 1.13 04/18/2019   INR 1.05 10/17/2012   HGBA1C 9.3 (H) 05/07/2021   MICROALBUR 2.1 05/21/2020   Assessment/Plan:  AUDRICK LAMOUREAUX is a 80 y.o. Black or African American [2] male with  has a past medical history of Anxiety, Asthma, Benign neoplasm of colon, Bone infection ([place]), Cancer ([place]), COPD (chronic obstructive pulmonary disease) ([place]), Depression, Diverticulosis of colon (without mention of hemorrhage), DJD (degenerative joint disease), Esophageal reflux, Glaucoma, Headache, Hidradenitis, History of BPH, History of gynecomastia, Hyperlipidemia, Irritable bowel syndrome, Memory loss, Mitral stenosis, MVP (mitral valve prolapse), Obesity, unspecified, Pneumonia, Sleep apnea, Type II or unspecified type diabetes mellitus without mention of complication, not stated as  uncontrolled, and Unspecified venous (peripheral) insufficiency.  Diabetes mellitus type 2, uncontrolled, with complications ([place]) Lab Results  Component Value Date   HGBA1C 9.3 (H) 05/07/2021   Uncontrolled, pt encouraged for better med compliane and to continue current medical treatment insulin glargine, metformin, semaglutide   Dental infection Mild to mod, for antibx course,  to f/u any worsening symptoms or concerns  Contact dermatitis Mild uncontrolled, for predpac asd, and triam cr prn,  to f/u any worsening symptoms or concerns  Followup: Return if symptoms worsen or fail to improve.  Cathlean Cower, MD 06/16/2021 9:44 PM [facility]

## 2021-06-16 NOTE — Patient Instructions (Signed)
Please take all new medication as prescribed - the antibiotic, prednisone, and the steroid cream  Please continue all other medications as before, and refills have been done if requested.  Please have the pharmacy call with any other refills you may need.  Please continue your efforts at being more active, low cholesterol diabetic diet, and weight control.  Please keep your appointments with your specialists as you may have planned

## 2021-06-16 NOTE — Assessment & Plan Note (Signed)
Mild uncontrolled, for predpac asd, and triam cr prn,  to f/u any worsening symptoms or concerns

## 2021-06-16 NOTE — Assessment & Plan Note (Signed)
Mild to mod, for antibx course,  to f/u any worsening symptoms or concerns 

## 2021-06-18 ENCOUNTER — Telehealth: Payer: Self-pay | Admitting: *Deleted

## 2021-06-18 NOTE — Telephone Encounter (Signed)
Left message to call back, need Coumadin clinic appointment

## 2021-06-18 NOTE — Telephone Encounter (Signed)
-----   Message from Minus Breeding, MD sent at 06/13/2021  1:32 PM EDT ----- We would need to start him on warfarin and stop the Xarelto.  Please set him up with the Coumadin clinic.  Thank you ----- Message ----- From: Charlton Haws, [facility] Sent: 06/02/2021   9:50 AM EDT To: Minus Breeding, MD  Dr Percival Spanish, I am a clinical pharmacist at [facility] with Dr Ronnald Ramp. In my conversation with Mr Mallary I found that he has not been taking Xarelto due to high cost. Unfortunately he will not qualify for patient assistance for either Xarelto or Eliquis due to income being too high, but he also declines to pay high deductible to purchase Xarelto (>$250). If he truly requires anticoagulation, he may need to switch to warfarin.  Currently he is scheduled to f/u with you on 08/03/21. You may consider scheduling him sooner to discuss options.  Let me know if I can be of further assistance, Charlene Brooke, PharmD, [place], CPP Clinical Pharmacist [facility] at [facility] [phone]

## 2021-06-22 ENCOUNTER — Other Ambulatory Visit: Payer: Self-pay

## 2021-06-22 ENCOUNTER — Ambulatory Visit (INDEPENDENT_AMBULATORY_CARE_PROVIDER_SITE_OTHER): Payer: Medicare PPO | Admitting: Pharmacist

## 2021-06-22 DIAGNOSIS — E785 Hyperlipidemia, unspecified: Secondary | ICD-10-CM

## 2021-06-22 DIAGNOSIS — E119 Type 2 diabetes mellitus without complications: Secondary | ICD-10-CM

## 2021-06-22 DIAGNOSIS — I739 Peripheral vascular disease, unspecified: Secondary | ICD-10-CM

## 2021-06-22 DIAGNOSIS — I4892 Unspecified atrial flutter: Secondary | ICD-10-CM

## 2021-06-22 DIAGNOSIS — Z794 Long term (current) use of insulin: Secondary | ICD-10-CM | POA: Diagnosis not present

## 2021-06-22 DIAGNOSIS — Z20822 Contact with and (suspected) exposure to covid-19: Secondary | ICD-10-CM | POA: Diagnosis not present

## 2021-06-22 NOTE — Progress Notes (Signed)
Chronic Care Management Pharmacy Note  06/22/2021 Name:  Jason [name] MRN:  [id number] DOB:  1941-07-09  Summary: -Pt is not compliant with Ozempic, he tries to take it on Mondays but often forgets. He is not sure if he took it today -Fasting BG is at goal, post-prandial BG in 200-300 range -Per cardiology- planning to transition Xarelto to warfarin due to cost; pt has not returned call to schedule  Recommendations/Changes made from today's visit: -Advised pt to set phone alarm for Ozempic to repeat on Mondays -Advised pt to call cardiology to schedule Coumadin clinic visit   Subjective: Jason [name] is an 80 y.o. year old male who is a primary patient of [name], MD.  The CCM team was consulted for assistance with disease management and care coordination needs.    Engaged with patient by telephone for follow up visit in response to provider referral for pharmacy case management and/or care coordination services.   Consent to Services:  The patient was given information about Chronic Care Management services, agreed to services, and gave verbal consent prior to initiation of services.  Please see initial visit note for detailed documentation.   Patient Care Team: [name], MD as PCP - General (Internal Medicine) Minus Breeding, MD as PCP - Cardiology (Cardiology) Charlton Haws, [facility] as Pharmacist (Pharmacist) Knox Royalty, RN as Case Manager  Recent office visits: 05/07/21 Dr Ronnald Ramp OV: chronic f/u; A1c up to 9.3. Pt noncompliant with meds. Advised to restart insulin (Toujeo).  01/06/21 Dr Ronnald Ramp OV: DM f/u, A1c improved to 8.2%. No changes.  09/30/20 Dr Ronnald Ramp OV: DM f/u, started Toujeo and Gvoke.  Recent consult visits: 01/01/21 Dr Percival Spanish (cardiology): EKG c/w A flutter, has been on low-dose Xarelto. Changed to full dose 20 mg, and stop aspirin. Trouble with finger sticks?  11/03/20 RD Levie Heritage: Ozempic pen instruction  Hospital  visits: None in previous 6 months  Objective:  Lab Results  Component Value Date   CREATININE 1.21 05/07/2021   BUN 13 05/07/2021   GFR 57.09 (L) 05/07/2021   GFRNONAA >60 05/17/2019   GFRAA >60 05/17/2019   NA 136 05/07/2021   K 4.6 05/07/2021   CALCIUM 9.3 05/07/2021   CO2 27 05/07/2021   GLUCOSE 359 (H) 05/07/2021    Lab Results  Component Value Date/Time   HGBA1C 9.3 (H) 05/07/2021 01:54 PM   HGBA1C 8.2 (H) 01/06/2021 11:51 AM   GFR 57.09 (L) 05/07/2021 01:54 PM   GFR 76.50 01/06/2021 11:51 AM   MICROALBUR 2.1 05/21/2020 02:58 PM   MICROALBUR 11.3 (H) 04/18/2019 10:43 AM    Last diabetic Eye exam:  Lab Results  Component Value Date/Time   HMDIABEYEEXA No Retinopathy 04/29/2021 12:00 AM    Last diabetic Foot exam:  Lab Results  Component Value Date/Time   HMDIABFOOTEX done 08/09/2014 12:00 AM     Lab Results  Component Value Date   CHOL 147 05/21/2020   HDL 49 05/21/2020   LDLCALC 79 05/21/2020   TRIG 100 05/21/2020   CHOLHDL 3.0 05/21/2020    Hepatic Function Latest Ref Rng & Units 05/21/2020 05/17/2019 04/18/2019  Total Protein 6.1 - 8.1 g/dL 6.7 6.5 7.1  Albumin 3.5 - 5.0 g/dL - 3.6 4.1  AST 10 - 35 U/L '12 19 12  ' ALT 9 - 46 U/L '11 18 13  ' Alk Phosphatase 38 - 126 U/L - 32(L) 43  Total Bilirubin 0.2 - 1.2 mg/dL 0.6 1.5(H) 0.9  Bilirubin, Direct  0.0 - 0.2 mg/dL 0.2 - -    Lab Results  Component Value Date/Time   TSH 0.78 05/07/2021 01:54 PM   TSH 0.85 04/18/2019 10:35 AM    CBC Latest Ref Rng & Units 09/30/2020 06/30/2020 05/21/2020  WBC 4.0 - 10.5 K/uL 4.1 4.6 4.9  Hemoglobin 13.0 - 17.0 g/dL 13.1 12.6(L) 12.4(L)  Hematocrit 39.0 - 52.0 % 39.1 38.3(L) 37.0(L)  Platelets 150.0 - 400.0 K/uL 165.0 159 146    No results found for: VD25OH  Clinical ASCVD: Yes  - PAD The 10-year ASCVD risk score Mikey Bussing DC Jr., et al., 2013) is: 36.5%   Values used to calculate the score:     Age: 13 years     Sex: Male     Is Non-Hispanic African American: Yes      Diabetic: Yes     Tobacco smoker: Yes     Systolic Blood Pressure: 301 mmHg     Is BP treated: No     HDL Cholesterol: 49 mg/dL     Total Cholesterol: 147 mg/dL    Depression screen [facility] 2/9 05/13/2021 10/20/2020 12/31/2019  Decreased Interest 0 1 2  Down, Depressed, Hopeless 1 2 0  PHQ - 2 Score '1 3 2  ' Altered sleeping - - 1  Tired, decreased energy - - 1  Change in appetite - - 1  Feeling bad or failure about yourself  - - 0  Trouble concentrating - - 1  Moving slowly or fidgety/restless - - 1  Suicidal thoughts - - 0  PHQ-9 Score - - 7  Difficult doing work/chores - - Not difficult at all  Some recent data might be hidden    CHA2DS2-VASc Score = 5  The patient's score is based upon: CHF History: No HTN History: Yes Diabetes History: Yes Stroke History: No Vascular Disease History: Yes Age Score: 2 Gender Score: 0      Social History   Tobacco Use  Smoking Status Former   Packs/day: 0.10   Years: 5.00   Pack years: 0.50   Types: Cigarettes   Quit date: 05/10/2015   Years since quitting: 6.1  Smokeless Tobacco Never   BP Readings from Last 3 Encounters:  06/16/21 120/68  05/07/21 128/62  01/28/21 128/60   Pulse Readings from Last 3 Encounters:  06/16/21 75  05/07/21 77  01/28/21 66   Wt Readings from Last 3 Encounters:  06/16/21 289 lb (131.1 kg)  05/07/21 295 lb (133.8 kg)  01/28/21 300 lb (136.1 kg)   BMI Readings from Last 3 Encounters:  06/16/21 31.75 kg/m  05/07/21 32.41 kg/m  01/28/21 32.96 kg/m    Assessment/Interventions: Review of patient past medical history, allergies, medications, health status, including review of consultants reports, laboratory and other test data, was performed as part of comprehensive evaluation and provision of chronic care management services.   SDOH:  (Social Determinants of Health) assessments and interventions performed: Yes   SDOH Screenings   Alcohol Screen: Not on file  Depression (PHQ2-9): Low Risk     PHQ-2 Score: 1  Financial Resource Strain: Medium Risk   Difficulty of Paying Living Expenses: Somewhat hard  Food Insecurity: Food Insecurity Present   Worried About Charity fundraiser in the Last Year: Sometimes true   Ran Out of Food in the Last Year: Sometimes true  Housing: Medium Risk   Last Housing Risk Score: 1  Physical Activity: Not on file  Social Connections: Not on file  Stress: Not on file  Tobacco Use: Medium Risk   Smoking Tobacco Use: Former   Smokeless Tobacco Use: Never  Transportation Needs: No Data processing manager (Medical): No   Lack of Transportation (Non-Medical): No    CCM Care Plan  Allergies  Allergen Reactions   Bactrim [Sulfamethoxazole-Trimethoprim] Rash    Medications Reviewed Today     Reviewed by Biagio Borg, MD (Physician) on 06/16/21 at 2144  Med List Status: <None>   Medication Order Taking? Sig Documenting Provider Last Dose Status Informant  amoxicillin-clavulanate (AUGMENTIN) 875-125 MG tablet [id number] Yes Take 1 tablet by mouth 2 (two) times daily. Biagio Borg, MD  Active   Blood Glucose Calibration (ACCU-CHEK AVIVA) SOLN [id number] Yes 1 Act by In Vitro route 3 (three) times daily. [name], MD Taking Active   blood glucose meter kit and supplies KIT [id number] Yes Use to check blood sugar daily. DX: E11. Hoyt Koch, MD Taking Active   Blood Glucose Monitoring Suppl (ACCU-CHEK AVIVA PLUS) w/Device KIT [id number] Yes 1 Act by Does not apply route 3 (three) times daily. [name], MD Taking Active   Continuous Blood Gluc Receiver (FREESTYLE LIBRE 2 READER) DEVI [id number] Yes 1 Act by Does not apply route daily. [name], MD Taking Active   Continuous Blood Gluc Sensor (FREESTYLE LIBRE 2 SENSOR) [place] [id number] Yes 1 Act by Does not apply route daily. [name], MD Taking Active   Glucagon (GVOKE HYPOPEN 2-PACK) 1 MG/0.2ML Darden [name] [id number] Yes Inject 1 Act into the skin daily as  needed. [name], MD Taking Active   glucose blood (ACCU-CHEK AVIVA PLUS) test strip [id number] Yes 1 each by Other route 3 (three) times daily. Use as instructed [name], MD Taking Active   insulin glargine, 2 Unit Dial, (TOUJEO MAX SOLOSTAR) 300 UNIT/ML Solostar Pen [id number] Yes Inject 30 Units into the skin daily.  Patient taking differently: Inject 20 Units into the skin daily.   [name], MD Taking Active   Insulin Pen Needle 32G X 6 MM MISC [id number] Yes 1 Act by Does not apply route in the morning and at bedtime. [name], MD Taking Active   metFORMIN (GLUCOPHAGE) 500 MG tablet [id number] Yes TAKE 2 TABLETS TWICE DAILY WITH MEALS [name], MD Taking Active   Multiple Vitamins-Minerals (MULTIVITAMIN WITH MINERALS) tablet [id number] Yes Take 1 tablet by mouth daily. [provider] Taking Active Self  predniSONE (DELTASONE) 10 MG tablet [id number] Yes 2 tabs by mouth per day for 5 days Biagio Borg, MD  Active   rivaroxaban (XARELTO) 20 MG TABS tablet [id number] Yes Take 1 tablet (20 mg total) by mouth daily with supper. Minus Breeding, MD Taking Active   Semaglutide,0.25 or 0.5MG/DOS, (OZEMPIC, 0.25 OR 0.5 MG/DOSE,) 2 MG/1.5ML SOPN [id number] Yes Inject 1 mg into the skin once a week. [name], MD Taking Active   simvastatin (ZOCOR) 40 MG tablet [id number] Yes Take 1 tablet (40 mg total) by mouth daily at 6 PM. [name], MD Taking Active   thiamine (VITAMIN B-1) 50 MG tablet [id number] Yes Take 1 tablet (50 mg total) by mouth daily. [name], MD Taking Active   triamcinolone cream (KENALOG) 0.1 % [id number] Yes Apply 1 application topically 2 (two) times daily. Biagio Borg, MD  Active   TRUEplus Safety Lancets 28G [place] [id number] Yes 1 Act by Does not apply route in the morning and at bedtime. [name],  MD Taking Active   vitamin C (ASCORBIC ACID) 500 MG tablet [id number] Yes Take 1,000 mg by mouth daily. [provider]  Taking Active Self  Wound Dressings (MEDIHONEY WOUND/BURN DRESSING) GEL [id number] Yes Apply to affected are 3 times a week, and cover with sterile dressing. [name], MD Taking Active             Patient Active Problem List   Diagnosis Date Noted   Dental infection 06/16/2021   Contact dermatitis 06/16/2021   Nonrheumatic mitral valve stenosis 01/27/2021   Atherosclerosis of aorta ([place]) 09/30/2020   DOE (dyspnea on exertion) 09/30/2020   Abnormal electrocardiogram (ECG) (EKG) 09/30/2020   Thiamine deficiency 07/04/2020   Ulcer of toe, right, with necrosis of bone ([place])    Diabetic ulcer of toe of right foot associated with type 2 diabetes mellitus, with fat layer exposed ([place]) 04/18/2019   Malignant neoplasm of urinary bladder (HCC) 07/11/2018   Chronic idiopathic constipation 06/12/2018   Diabetic infection of right foot ([place]) 07/12/2017   Foot ulcer, right, with unspecified severity ([place]) 01/17/2017   Complex sleep apnea syndrome 01/12/2016   Diabetes mellitus type 2, uncontrolled, with complications ([place]) 72/62/0355   OSA (obstructive sleep apnea) 12/18/2015   Benign prostatic hyperplasia 02/18/2014   Routine general medical examination at a health care facility 02/18/2014   PAD (peripheral artery disease) ([place]) 02/18/2014   DEGENERATIVE JOINT DISEASE 02/20/2008   Diabetes mellitus type 2 with atherosclerosis of arteries of extremities ([place]) 01/26/2008   Hyperlipidemia with target LDL less than 70 01/26/2008   Morbid obesity ([place]) 01/26/2008   Mitral valve disorder 01/26/2008   GERD 01/26/2008   Irritable bowel syndrome 01/26/2008    Immunization History  Administered Date(s) Administered   Fluad Quad(high Dose 65+) 07/19/2019, 09/30/2020   Influenza Split 08/30/2011, 08/22/2012, 09/10/2015   Influenza Whole 10/24/2007, 08/26/2009   Influenza, High Dose Seasonal PF 07/12/2017   Influenza,inj,Quad PF,6+ Mos 08/20/2013, 08/09/2014, 08/23/2016    Influenza-Unspecified 09/12/2015, 10/18/2018   PFIZER(Purple Top)SARS-COV-2 Vaccination 12/04/2019, 01/03/2020, 08/28/2020   Pneumococcal Conjugate-13 02/18/2014   Pneumococcal Polysaccharide-23 08/06/2008, 09/13/2019   Td 02/03/2009   Tdap 09/13/2019   Zoster, Live 08/09/2014    Conditions to be addressed/monitored:  Hyperlipidemia, PAD, CAD, Diabetes and Atrial Flutter   Care Plan : [place]  Updates made by Charlton Haws, [place] since 06/22/2021 12:00 AM     Problem: Hyperlipidemia, PAD, CAD, Diabetes and Atrial Flutter   Priority: High     Long-Range Goal: Disease management   Start Date: 02/05/2021  Expected End Date: 08/08/2021  This Visit's Progress: Not on track  Recent Progress: Not on track  Priority: High  Note:   Current Barriers:  Unable to independently afford treatment regimen Unable to independently monitor therapeutic efficacy Unable to achieve control of diabetes  Unable to self administer medications as prescribed Does not adhere to prescribed medication regimen Does not contact provider office for questions/concerns  Pharmacist Clinical Goal(s):  Patient will verbalize ability to afford treatment regimen achieve adherence to monitoring guidelines and medication adherence to achieve therapeutic efficacy achieve control of diabetes as evidenced by improved A1c achieve ability to self administer medications as prescribed through use of insulin pens as evidenced by patient report adhere to prescribed medication regimen as evidenced by patient report contact provider office for questions/concerns as evidenced notation of same in electronic health record through collaboration with PharmD and provider.   Interventions: 1:1 collaboration with [name], MD regarding development  and update of comprehensive plan of care as evidenced by provider attestation and co-signature Inter-disciplinary care team collaboration (see longitudinal plan of  care) Comprehensive medication review performed; medication list updated in electronic medical record  Hyperlipidemia (LDL goal < 70) -Not ideally controlled - LDL is close to goal, but not < 70. Pt endorses compliance and denies side effects -hx PAD, aortic atherosclerosis -Current treatment: Simvastatin 40 mg daily   Aspirin 81 mg daily -Medications previously tried: none  -Patient may benefit from switching to a higher intensity statin in the future given hx of PAD, aortic atherosclerosis. Will wait for results of next lipid panel before making changes.  Diabetes (A1c goal <8%) -Uncontrolled - A1c recently worsened 8.2 > 9.3, likely d/t noncompliance w/ Ozempic; pt is still struggling with remembering Ozempic, he reports he tries to take it on Mondays but isn't sure if he took it today -hx diabetic food infection, R toe amputations (all toes) -Current medications: Meformin 500 mg - 2 tab BID Toujeo Max 20 units daily - Sanofi PAP Ozempic 0.5 mg weekly (Sundays) Gvoke Hypopen Freestyle Libre CGM - not covered -Medications previously tried:  Anastasio Auerbach, Catering manager, Farxiga, glimepiride -Current home glucose readings fasting glucose: 81-132 post prandial glucose: 200-300 -Denies hypoglycemic symptoms -Advised to set phone alarm for Ozempic on Mondays -Informed pt that Ozempic PAP was approved and is being shipped to office -Recommend to continue current medication  Atrial Flutter (Goal: prevent stroke and major bleeding) -Dx 12/2020 from EKG. Pt is asymptomatic. -Uncontrolled - pt is not taking Xarelto because he could not afford the copay (>$250). Per cardiology, planning to transition to warfarin but unable to reach pt to schedule -hx mitral valve stenosis -CHADSVASC: 5 -Current treatment: Rate control: none Anticoagulation: Xarelto 20 mg daily PM (not taking) -Medications previously tried: none  -Advised pt to call Cardiology to schedule coumadin clinic visit  Health  Maintenance -Vaccine gaps: Shingrix, covid booster -Counseled to get Shngrix, covid booster  Patient Goals/Self-Care Activities Patient will:  - take medications as prescribed -focus on medication adherence by pill box -check glucose twice a day, document, and provide at future appointments -target a minimum of 150 minutes of moderate intensity exercise weekly -Set a phone alarm for Ozempic to repeat on Mondays -Call Cardiology to schedule Coumadin clinic visit     Medication Assistance:  Toujeo - San[employer]i PAP approved through 11/14/21 [employer] Cares approved through 10/14/21 Xarelto - pt will not qualify for PAP this year due to failure to meet 4% OOP spend criteria (would need to spend >$2600)  Compliance/Adherence/Medication fill history: Care Gaps: Shingrix Covid booster (due 11/28/20) Colonoscopy (due 05/10/18)  Star-Rating Drugs: Simvastatin - LF 08/18/20 x 90 ds ([employer]) Metformin - LF 08/18/20 x 90 ds ([employer])  Patient's preferred pharmacy is:  [facility] [phone] - [place], [place] - [address] [place] [address] Phone: [phone] Fax: [phone]  [facility] ([facility] - [place], [address] [place] [address] Phone: [phone] Fax: [phone]  Uses pill box? No - keeps all meds in 1 container Pt endorses 90% compliance (not taking Ozempic or Xarelto)  We discussed: Current pharmacy is preferred with insurance plan and patient is satisfied with pharmacy services Patient decided to: Continue current medication management strategy  Care Plan and Follow Up Patient Decision:  Patient agrees to Care Plan and Follow-up.  Plan: Telephone follow  up appointment with care management team member scheduled for:  1 month  Charlene Brooke, PharmD, [place], CPP Clinical Pharmacist [facility]  at [facility] [phone]

## 2021-06-22 NOTE — Patient Instructions (Signed)
Visit Information  Phone number for Pharmacist: [phone]   Goals Addressed             This Visit's Progress    Manage My Medicine       Timeframe:  Long-Range Goal Priority:  High Start Date:      02/05/21                       Expected End Date:  12/15/21                 Follow Up Date Sept 2022   - call for medicine refill 2 or 3 days before it runs out - call if I am sick and can't take my medicine - keep a list of all the medicines I take; vitamins and herbals too - use a pillbox to sort medicine  - Let the doctors know when you cannot afford or you do not know how to take a medication -Set a phone alarm for Ozempic to repeat on Mondays -Call Cardiology to schedule Coumadin clinic visit   Why is this important?   These steps will help you keep on track with your medicines.   Notes:         Care Plan : CCM Pharmacy Care Plan  Updates made by Charlton Haws, RPH since 06/22/2021 12:00 AM     Problem: Hyperlipidemia, PAD, CAD, Diabetes and Atrial Flutter   Priority: High     Long-Range Goal: Disease management   Start Date: 02/05/2021  Expected End Date: 08/08/2021  This Visit's Progress: Not on track  Recent Progress: Not on track  Priority: High  Note:   Current Barriers:  Unable to independently afford treatment regimen Unable to independently monitor therapeutic efficacy Unable to achieve control of diabetes  Unable to self administer medications as prescribed Does not adhere to prescribed medication regimen Does not contact provider office for questions/concerns  Pharmacist Clinical Goal(s):  Patient will verbalize ability to afford treatment regimen achieve adherence to monitoring guidelines and medication adherence to achieve therapeutic efficacy achieve control of diabetes as evidenced by improved A1c achieve ability to self administer medications as prescribed through use of insulin pens as evidenced by patient report adhere to prescribed  medication regimen as evidenced by patient report contact provider office for questions/concerns as evidenced notation of same in electronic health record through collaboration with PharmD and provider.   Interventions: 1:1 collaboration with [name], MD regarding development and update of comprehensive plan of care as evidenced by provider attestation and co-signature Inter-disciplinary care team collaboration (see longitudinal plan of care) Comprehensive medication review performed; medication list updated in electronic medical record  Hyperlipidemia (LDL goal < 70) -Not ideally controlled - LDL is close to goal, but not < 70. Pt endorses compliance and denies side effects -hx PAD, aortic atherosclerosis -Current treatment: Simvastatin 40 mg daily   Aspirin 81 mg daily -Medications previously tried: none  -Patient may benefit from switching to a higher intensity statin in the future given hx of PAD, aortic atherosclerosis. Will wait for results of next lipid panel before making changes.  Diabetes (A1c goal <8%) -Uncontrolled - A1c recently worsened 8.2 > 9.3, likely d/t noncompliance w/ Ozempic; pt is still struggling with remembering Ozempic, he reports he tries to take it on Mondays but isn't sure if he took it today -hx diabetic food infection, R toe amputations (all toes) -Current medications: Meformin 500 mg - 2 tab BID Toujeo Max  20 units daily - Sanofi PAP Ozempic 0.5 mg weekly (Sundays) Gvoke Hypopen Freestyle Libre CGM - not covered -Medications previously tried:  Anastasio Auerbach, Catering manager, Farxiga, glimepiride -Current home glucose readings fasting glucose: 81-132 post prandial glucose: 200-300 -Denies hypoglycemic symptoms -Advised to set phone alarm for Ozempic on Mondays -Informed pt that Ozempic PAP was approved and is being shipped to office -Recommend to continue current medication  Atrial Flutter (Goal: prevent stroke and major bleeding) -Dx 12/2020 from EKG.  Pt is asymptomatic. -Uncontrolled - pt is not taking Xarelto because he could not afford the copay (>$250). Per cardiology, planning to transition to warfarin but unable to reach pt to schedule -hx mitral valve stenosis -CHADSVASC: 5 -Current treatment: Rate control: none Anticoagulation: Xarelto 20 mg daily PM (not taking) -Medications previously tried: none  -Advised pt to call Cardiology to schedule coumadin clinic visit  Health Maintenance -Vaccine gaps: Shingrix, covid booster -Counseled to get Shngrix, covid booster  Patient Goals/Self-Care Activities Patient will:  - take medications as prescribed -focus on medication adherence by pill box -check glucose twice a day, document, and provide at future appointments -target a minimum of 150 minutes of moderate intensity exercise weekly -Set a phone alarm for Ozempic to repeat on Mondays -Call Cardiology to schedule Coumadin clinic visit      The patient verbalized understanding of instructions, educational materials, and care plan provided today and agreed to receive a mailed copy of patient instructions, educational materials, and care plan.  Telephone follow up appointment with pharmacy team member scheduled for: 1 month  Charlene Brooke, PharmD, [place], CPP Clinical Pharmacist [facility] at [facility] [phone]

## 2021-06-30 ENCOUNTER — Telehealth: Payer: Self-pay | Admitting: *Deleted

## 2021-06-30 ENCOUNTER — Telehealth: Payer: Medicare PPO

## 2021-06-30 NOTE — Telephone Encounter (Signed)
  Chronic Care Management   Follow Up Note   06/30/2021 Name: Jason [name] MRN: [id number] DOB: 08-21-1941   Referred by: [name], MD Reason for referral : No chief complaint on file.  An unsuccessful telephone outreach was attempted today. The patient was referred to the case management team for assistance with care management and care coordination.   Follow Up Plan:  A HIPPA compliant phone message was left for the patient providing contact information and requesting a return call Will place request with scheduling care guide to contact patient to re-schedule today's missed CCM RN follow up telephone appointment  [name], RN, BSN, [place] [phone]: direct office [phone]: mobile

## 2021-07-07 NOTE — Telephone Encounter (Signed)
Ozempic from PAP in office. Pt notified & is able to pick up medication today. Medication will be in nurse visit room fridge.

## 2021-07-09 NOTE — Progress Notes (Signed)
    Chronic Care Management Pharmacy Assistant   Name: Jason [name]  MRN: [id number] DOB: 1941/03/10  Made a call to the patient this morning to check and see if he was taking his Ozempic on Mondays as scheduled and if he had called his cardiologist to set up appt. Unable to reach patient. Left message for patient to return call.   [place] Pharmacist Assistant [phone]   Time spent:3

## 2021-07-10 NOTE — Telephone Encounter (Signed)
Left message to call back  

## 2021-07-16 NOTE — Telephone Encounter (Signed)
Left message to call back  

## 2021-07-17 ENCOUNTER — Ambulatory Visit (INDEPENDENT_AMBULATORY_CARE_PROVIDER_SITE_OTHER): Payer: Medicare PPO | Admitting: *Deleted

## 2021-07-17 DIAGNOSIS — E119 Type 2 diabetes mellitus without complications: Secondary | ICD-10-CM

## 2021-07-17 DIAGNOSIS — I70209 Unspecified atherosclerosis of native arteries of extremities, unspecified extremity: Secondary | ICD-10-CM | POA: Diagnosis not present

## 2021-07-17 DIAGNOSIS — Z794 Long term (current) use of insulin: Secondary | ICD-10-CM

## 2021-07-17 DIAGNOSIS — E1151 Type 2 diabetes mellitus with diabetic peripheral angiopathy without gangrene: Secondary | ICD-10-CM | POA: Diagnosis not present

## 2021-07-17 DIAGNOSIS — E785 Hyperlipidemia, unspecified: Secondary | ICD-10-CM

## 2021-07-17 NOTE — Patient Instructions (Signed)
Visit Information  "Jason [name]," it was nice talking with you today.   I look forward to talking to you again for an update on Tuesday, August 18, 2021 at 9:00 am- please be listening out for my call that day.  I will call as close to 9:00 am as possible.   If you need to cancel or re-schedule our telephone visit, please call [phone] and one of our care guides will be happy to assist you.   I look forward to hearing about your progress.   Please don't hesitate to contact me if I can be of assistance to you before our next scheduled telephone appointment.   [name], RN, BSN, [facility] RN Care Coordination- [place] [phone]: direct office [phone]: mobile   PATIENT GOALS:  Goals Addressed             This Visit's Progress    Get in a routine at home for managing Diabetes   Not on track    Timeframe:  Long-Range Goal Priority:  Medium Start Date:     05/13/21                        Expected End Date:        05/13/22               Follow Up Date 08/18/2021    Continue to learn about how having diabetes affects my health As soon as possible, please begin regular monitoring and writing down of your blood sugars at home: every morning before eating (fasting) and then again 2 hours after a meal Take your medications as prescribed: consider using a weekly pill box at home and consider making a written reminder to take your medications: especially your insulin at night We will review your blood sugars at home during each of our phone calls- please have these blood sugar values ready for review when we talk on August 18, 2021 Great job making small changes to your diet to decrease your blood sugar levels: you reported that you have stopped drinking beer and have made changes to the way you organize your food at home-- these are great strategies to help you begin decreasing your blood sugars-- this will also help your cholesterol  levels Continue reviewing the educational material and the "Living Well with Diabetes" booklet: learn as much as you can about things you can do to decrease your blood sugar levels Talk to my doctor about my target A1C and work toward meeting that target- we will talk more about this during our future phone calls  Please stay in touch with the pharmacist at Dr. Ronnald Ramp' office about your medication management at home   Why is this important?   Your target A1C is decided together by you and your doctor.  It is based on several things like your age and other health issues.           Type 2 Diabetes Mellitus, Self-Care, Adult When you have type 2 diabetes (type 2 diabetes mellitus), you must make sure your blood sugar (glucose) stays in a healthy range. You can do this with: Nutrition. Exercise. Lifestyle changes. Medicines or insulin, if needed. Support from your doctors and others. What are the risks? Having type 2 diabetes can raise your risk for other long-term (chronic) health problems. You may get medicines to help prevent these problems. How to stay aware of your blood sugar  Check  your blood sugar level every day, as often as told. Have your A1C (hemoglobin A1C) level checked two or more times a year. Have it checked more often if told. Your doctor will set personal treatment goals for you. In general, you should have these blood sugar levels: Before meals: 80-130 mg/dL (4.4-7.2 mmol/L). After meals: below 180 mg/dL (10 mmol/L). A1C: less than 7%. How to manage high and low blood sugar Symptoms of high blood sugar High blood sugar is also called hyperglycemia. Know the symptoms of high blood sugar. These may include: More thirst. Hunger. Feeling very tired. Needing to pee (urinate) more often than normal. Seeing things blurry. Symptoms of low blood sugar Low blood sugar is also called hypoglycemia. This is when blood sugar is at or below 70 mg/dL (3.9 mmol/L). Symptoms may  include: Hunger. Feeling worried or nervous (anxious). Feeling sweaty and cold to the touch (clammy). Being dizzy or light-headed. Feeling sleepy. A fast heartbeat. Feeling grouchy (irritable). Tingling or loss of feeling (numbness) around your mouth, lips, or tongue. Restless sleep. Diabetes medicines can cause low blood sugar. You are more at risk: While you exercise. After exercise. During sleep. When you are sick. When you skip meals or do not eat for a long time. Treating low blood sugar If you think you have low blood sugar, eat or drink something sugary right away. Keep 15 grams of a fast-acting carb (carbohydrate) with you all the time. Make sure your family and friends know how to treat you if you cannot treat yourself. Treating very low blood sugar Severe hypoglycemia is when your blood sugar is at or below 54 mg/dL (3 mmol/L). Severe hypoglycemia is an emergency. Get medical help right away. Call your local emergency services (911 in the [place] Do not wait to see if the symptoms will go away. Do not drive yourself to the hospital. You may need a glucagon shot if you have very low blood sugar and you cannot eat or drink. Have a family member or friend learn how to check your blood sugar and how to give you a glucagon shot. Ask your doctor if you should have a kit for glucagon shots. Follow these instructions at home: Medicines Take prescribed insulin or diabetes medicines as told by your health care provider. Do not run out of insulin or other medicines. Plan ahead. If you use insulin, change the amount you take based on how active you are and what foods you eat. Your doctor will tell you how to do this. Take over-the-counter and prescription medicines only as told by your doctor. Eating and drinking  Eat healthy foods. These include: Low-fat (lean) proteins. Complex carbs, such as whole grains. Fresh fruits and vegetables. Low-fat dairy products. Healthy fats. Meet  with a food expert (dietitian) to make an eating plan. Follow instructions from your doctor about what you cannot eat or drink. Drink enough fluid to keep your pee (urine) pale yellow. Keep track of carbs that you eat. Read food labels and learn serving sizes of foods. Follow your sick-day plan when you cannot eat or drink as normal. Make this plan with your doctor so it is ready to use. Activity Exercise as told by your doctor. You may need to: Do stretching and strength exercises two or more times a week. Do 150 minutes or more of exercise each week that makes your heart beat faster and makes you sweat. Spread out your exercise over 3 or more days a week. Do not go more  than 2 days in a row without exercise. Talk with your doctor before you start a new exercise. Your doctor may tell you to change: How much insulin or medicines you take. How much food you eat. Lifestyle Do not smoke or use any products that contain nicotine or tobacco. If you need help quitting, ask your doctor. If you drink alcohol and your doctor says that it is safe for you: Limit how much you have to: 0-1 drink a day for women who are not pregnant. 0-2 drinks a day for men. Know how much alcohol is in your drink. In the [place] one drink equals one 12 oz bottle of beer (355 mL), one 5 oz glass of wine (148 mL), or one 1 oz glass of hard liquor (44 mL). Learn to deal with stress. If you need help, ask your doctor. Body care  Stay up to date with your shots (immunizations). Have your eyes and feet checked by a doctor as often as told. Check your skin and feet every day. Check for cuts, bruises, redness, blisters, or sores. Brush your teeth and gums two times a day. Floss one or more times a day. Go to the dentist one or more times every 6 months. Stay at a healthy weight. General instructions Share your diabetes care plan with: Your work or school. People you live with. Carry a card or wear jewelry that says you  have diabetes. Keep all follow-up visits. Questions to ask your doctor Do I need to meet with a certified expert in diabetes education and care? Where can I find a support group? Where to find more information For help and guidance and more information about diabetes, please go to: American Diabetes Association: www.diabetes.org American Association of Diabetes Care and Education Specialists: www.diabeteseducator.org International Diabetes Federation: MemberVerification.ca Summary When you have type 2 diabetes, you must make sure your blood sugar (glucose) stays in a healthy range. You can do this with nutrition, exercise, medicines and insulin, and support from doctors and others. Check your blood sugar every day, or as often as told. Having diabetes can raise your risk for other long-term health problems. You may get medicines to help prevent these problems. Share your diabetes management plan with people at work, school, and home. Keep all follow-up visits. This information is not intended to replace advice given to you by your health care provider. Make sure you discuss any questions you have with your health care provider. Document Revised: 01/26/2021 Document Reviewed: 01/26/2021 Elsevier Patient Education  [place] patient verbalized understanding of instructions, educational materials, and care plan provided today and agreed to receive a mailed copy of patient instructions, educational materials, and care plan.  Telephone follow up appointment with care management team member scheduled for:  Tuesday, August 18, 2021 at 9:00 am The patient has been provided with contact information for the care management team and has been advised to call with any health related questions or concerns.   [name], RN, BSN, [facility] RN Care Coordination- [place] [phone]: direct office [phone]: mobile

## 2021-07-17 NOTE — Chronic Care Management (AMB) (Signed)
Chronic Care Management   CCM RN Visit Note  07/17/2021 Name: Jason [name] MRN: [id number] DOB: September 16, 1941  Subjective: Jason [name] is a 80 y.o. year old male who is a primary care patient of [name], MD. The care management team was consulted for assistance with disease management and care coordination needs.    Engaged with patient by telephone for follow up visit in response to provider referral for case management and/or care coordination services.   Consent to Services:  The patient was given information about Chronic Care Management services, agreed to services, and gave verbal consent prior to initiation of services.  Please see initial visit note for detailed documentation.  Patient agreed to services and verbal consent obtained.   Assessment: Review of patient past medical history, allergies, medications, health status, including review of consultants reports, laboratory and other test data, was performed as part of comprehensive evaluation and provision of chronic care management services.   CCM Care Plan Allergies  Allergen Reactions   Bactrim [Sulfamethoxazole-Trimethoprim] Rash   Outpatient Encounter Medications as of 07/17/2021  Medication Sig   aspirin EC 81 MG tablet Take 81 mg by mouth daily. Swallow whole.   amoxicillin-clavulanate (AUGMENTIN) 875-125 MG tablet Take 1 tablet by mouth 2 (two) times daily.   Blood Glucose Calibration (ACCU-CHEK AVIVA) SOLN 1 Act by In Vitro route 3 (three) times daily.   blood glucose meter kit and supplies KIT Use to check blood sugar daily. DX: E11.   Blood Glucose Monitoring Suppl (ACCU-CHEK AVIVA PLUS) w/Device KIT 1 Act by Does not apply route 3 (three) times daily.   Continuous Blood Gluc Receiver (FREESTYLE LIBRE 2 READER) DEVI 1 Act by Does not apply route daily. (Patient not taking: Reported on 06/22/2021)   Continuous Blood Gluc Sensor (FREESTYLE LIBRE 2 SENSOR) MISC 1 Act by Does not apply route daily. (Patient not  taking: Reported on 06/22/2021)   Glucagon (GVOKE HYPOPEN 2-PACK) 1 MG/0.2ML SOAJ Inject 1 Act into the skin daily as needed.   glucose blood (ACCU-CHEK AVIVA PLUS) test strip 1 each by Other route 3 (three) times daily. Use as instructed   insulin glargine, 2 Unit Dial, (TOUJEO MAX SOLOSTAR) 300 UNIT/ML Solostar Pen Inject 30 Units into the skin daily. (Patient taking differently: Inject 20 Units into the skin daily.)   Insulin Pen Needle 32G X 6 MM MISC 1 Act by Does not apply route in the morning and at bedtime.   metFORMIN (GLUCOPHAGE) 500 MG tablet TAKE 2 TABLETS TWICE DAILY WITH MEALS   Multiple Vitamins-Minerals (MULTIVITAMIN WITH MINERALS) tablet Take 1 tablet by mouth daily.   predniSONE (DELTASONE) 10 MG tablet 2 tabs by mouth per day for 5 days   Semaglutide,0.25 or 0.5MG/DOS, (OZEMPIC, 0.25 OR 0.5 MG/DOSE,) 2 MG/1.5ML SOPN Inject 1 mg into the skin once a week. (Patient taking differently: Inject 0.5 mg into the skin once a week.)   simvastatin (ZOCOR) 40 MG tablet Take 1 tablet (40 mg total) by mouth daily at 6 PM.   thiamine (VITAMIN B-1) 50 MG tablet Take 1 tablet (50 mg total) by mouth daily.   triamcinolone cream (KENALOG) 0.1 % Apply 1 application topically 2 (two) times daily.   TRUEplus Safety Lancets 28G MISC 1 Act by Does not apply route in the morning and at bedtime.   vitamin C (ASCORBIC ACID) 500 MG tablet Take 1,000 mg by mouth daily.   Wound Dressings (MEDIHONEY WOUND/BURN DRESSING) GEL Apply to affected are 3 times a  week, and cover with sterile dressing.   No facility-administered encounter medications on file as of 07/17/2021.   Patient Active Problem List   Diagnosis Date Noted   Dental infection 06/16/2021   Contact dermatitis 06/16/2021   Nonrheumatic mitral valve stenosis 01/27/2021   Atherosclerosis of aorta ([place]) 09/30/2020   DOE (dyspnea on exertion) 09/30/2020   Abnormal electrocardiogram (ECG) (EKG) 09/30/2020   Thiamine deficiency 07/04/2020   Ulcer of  toe, right, with necrosis of bone ([place])    Diabetic ulcer of toe of right foot associated with type 2 diabetes mellitus, with fat layer exposed ([place]) 04/18/2019   Malignant neoplasm of urinary bladder ([facility]) 07/11/2018   Chronic idiopathic constipation 06/12/2018   Diabetic infection of right foot ([place]) 07/12/2017   Foot ulcer, right, with unspecified severity ([place]) 01/17/2017   Complex sleep apnea syndrome 01/12/2016   Diabetes mellitus type 2, uncontrolled, with complications ([place]) 86/77/3736   OSA (obstructive sleep apnea) 12/18/2015   Benign prostatic hyperplasia 02/18/2014   Routine general medical examination at a health care facility 02/18/2014   PAD (peripheral artery disease) ([place]) 02/18/2014   DEGENERATIVE JOINT DISEASE 02/20/2008   Diabetes mellitus type 2 with atherosclerosis of arteries of extremities ([place]) 01/26/2008   Hyperlipidemia with target LDL less than 70 01/26/2008   Morbid obesity ([place]) 01/26/2008   Mitral valve disorder 01/26/2008   GERD 01/26/2008   Irritable bowel syndrome 01/26/2008   Conditions to be addressed/monitored:  HLD and DMII  Care Plan : Diabetes Type 2 (Adult)  Updates made by Knox Royalty, RN since 07/17/2021 12:00 AM     Problem: Disease Progression (Diabetes, Type 2)   Priority: Medium     Long-Range Goal: Disease Progression Prevented or Minimized   Start Date: 05/13/2021  Expected End Date: 05/13/2022  This Visit's Progress: Not on track  Recent Progress: On track  Priority: Medium  Note:   Objective:  Lab Results  Component Value Date   HGBA1C 9.3 (H) 05/07/2021   Lab Results  Component Value Date   CREATININE 1.21 05/07/2021   CREATININE 0.95 01/06/2021   CREATININE 1.11 09/30/2020   No results found for: EGFR Current Barriers:  Knowledge Deficits related to basic Diabetes pathophysiology and self care/management Knowledge Deficits related to medications used for management of diabetes- patient reports he often forgets to  take his medications/ insulin: CCM Pharmacy team active Financial Constraints- reports could use assistance/ resources for managing utility bills: 05/27/21- confirms has spoken to [employer] and has been provided resources Unable to independently verbalize self-health management strategies for DM: diet, medications, routine home blood sugar monitoring-- will require ongoing reinforcement Does not adhere to provider recommendations re: blood sugar monitoring at home; medications- often forgets to take: [place] team involved High Fall Risk: reports 6-plus falls over last 12 months, all without injury Case Manager Clinical Goal(s): 07/17/21: not on track 05/13/21: Over the next 12 months, patient will demonstrate ongoing adherence to/ improvement in prescribed treatment plan for diabetes self care/management as evidenced by:  Daily monitoring and recording of CBG twice daily  Improved adherence to ADA/ carb modified diet  Adherence to prescribed medication regimen  Contacting care providers for new or worsened symptoms, concerns or questions Interventions:  Collaboration with [name], MD regarding development and update of comprehensive plan of care as evidenced by provider attestation and co-signature Inter-disciplinary care team collaboration (see longitudinal plan of care) Review of patient status, including review of consultants reports, relevant laboratory and other  test results, and medications completed Discussed current clinical condition with patient; he states today "doing pretty good" and denies specific clinical concerns Patient continues to report that he has not been monitoring/ recording blood sugars at home- states he just "can't into a routine;" he again verbalizes that he is "going to get started soon;" but has not been monitoring blood sugars at all over last few weeks Medications:  reports he has been taking Ozempic weekly- however, when questioned, it  appears that he has just recently started taking, as of today; unable to determine from patient's feedback how many weeks he has been taking   reports that he has been out of Toujeo insulin, "doesn't know how long," states he needs to call for refill but thinks he can not afford; from review of previous [facility] notes 06/22/21, it appears patient has PAP which was arranged by [facility] team for year 2022; encouraged patient to call outpatient pharmacy to inquire on possibility of refill; he states he will go to his pharmacy "today," and declines assistance in looking into this States taking metformin as prescribed; states he is using "hypopen;" but he cannot tell me how he is using it Not taking Xarelto, nor coumadin, has not made appointment with Coumadin clinic: states he is currently taking ASA 81 mg QD: this not on his medication list-- updated accordingly; reminded patient that he has scheduled cardiology appointment August 03, 2021 and need to address this issue: he states he is planning to attend as scheduled Not using pill box, continues to report ongoing difficulty consistently remembering to take meds; states he is going to talk to [facility] Pharmacist about obtaining a medication schedule to post in his home Reminded patient that [facility] team will contact him by phone 08/04/21 and encouraged his ongoing engagement with CCM Pharmacist- I will update CCM Pharmacist around details of today's visit with patient Again discussed with patient that if he will begin monitoring/ recording blood sugars at home AND taking medications as prescribed, he will likely see improvement in his blood sugar levels and A1-C levels; while he acknowledges this, he continues to report "just has a hard time getting into a routine" Reiterated previously provided information/ education around basic education around complications/ risks of long-term high blood sugars and talked with him about long-term effects of high  blood sugars, how cardiovascular risks are increased in setting of DM, reiterated need/ importance of keeping HLD under control  Falls- patient reports a new fall this week, "lost my footing going out the door;" states no injury, was able to get self-up without assistance: fall prevention again discussed with patient, he confirms he is not consistently using cane/ walker Reviewed scheduled/upcoming provider appointments including: 08/03/21 cardiology office visit/ [facility]; 08/04/21- [facility]CM Pharmacy team; 08/14/21 podiatry follow up visit Discussed plans with patient for ongoing care management follow up and provided patient with direct contact information for care management team Self-Care Activities UNABLE to independently verbalize dietary strategies for self-management of DM Self administers oral medications, insulin, and injectable Ozempic as prescribed- but admits often forgets medications Attends all scheduled provider appointments Checks blood sugars at home once-twice daily Attempts to adhere to prescribed ADA/carb modified- admits needs to begin making better dietary choices Patient Goals: Continue to learn about how having diabetes affects my health As soon as possible, please begin regular monitoring and writing down of your blood sugars at home: every morning before eating (fasting) and then again 2 hours after a meal Take your medications  as prescribed: consider using a weekly pill box at home and consider making a written reminder to take your medications: especially your insulin at night We will review your blood sugars at home during each of our phone calls- please have these blood sugar values ready for review when we talk on August 18, 2021 Great job making small changes to your diet to decrease your blood sugar levels: you reported that you have stopped drinking beer and have made changes to the way you organize your food at home-- these are great strategies to help you begin decreasing  your blood sugars-- this will also help your cholesterol levels Continue reviewing the educational material and the "Living Well with Diabetes" booklet: learn as much as you can about things you can do to decrease your blood sugar levels Talk to my doctor about my target A1C and work toward meeting that target- we will talk more about this during our future phone calls  Please stay in touch with the pharmacist at Dr. Ronnald Ramp' office about your medication management at home Follow Up Plan:  Telephone follow up appointment with care management team member scheduled for: Tuesday August 18, 2021 at 9:00 am The patient has been provided with contact information for the care management team and has been advised to call with any health related questions or concerns     Plan: Telephone follow up appointment with care management team member scheduled for:  Tuesday August 18, 2021 at 9:00 am The patient has been provided with contact information for the care management team and has been advised to call with any health related questions or concerns.   [name], RN, BSN, [facility] RN Care Coordination- [place] [phone]: direct office [phone]: mobile

## 2021-07-18 ENCOUNTER — Ambulatory Visit (HOSPITAL_COMMUNITY)
Admission: EM | Admit: 2021-07-18 | Discharge: 2021-07-18 | Disposition: A | Discharge status: Home / Self Care | Payer: Medicare PPO | Attending: Psychiatry | Admitting: Psychiatry

## 2021-07-18 ENCOUNTER — Other Ambulatory Visit: Payer: Self-pay

## 2021-07-18 DIAGNOSIS — Z008 Encounter for other general examination: Secondary | ICD-10-CM

## 2021-07-18 DIAGNOSIS — Z1330 Encounter for screening examination for mental health and behavioral disorders, unspecified: Secondary | ICD-10-CM | POA: Diagnosis not present

## 2021-07-18 NOTE — Progress Notes (Signed)
   07/18/21 1226  [place] (Walk-ins at [facility] only)  How Did You Hear About [place]? Legal System  What Is the Reason for Your Visit/Call Today? Patient presents voluntarily via [employer]. Per [employer], pt called them to request they stand by as he gets his things out of his house.  They spoke to wife, who report cognitive decline to include forgetting things, repeating things, and recently wandering and bringing home random women.  Patient states he doesn't need to be evaluated, but told officers he would come when offered, "but I didn't know it would involve all of this."  Patient requesting to leave and return home. Officers are out back in the case he needs transport home.  How Long Has This Been Causing You Problems? 1-6 months  Have You Recently Had Any Thoughts About Hurting Yourself? No  Are You Planning to Commit Suicide/Harm Yourself At This time? No  Have you Recently Had Thoughts About [place]? No  Are You Planning To Harm Someone At This Time? No  Are you currently experiencing any auditory, visual or other hallucinations? No  Have You Used Any Alcohol or Drugs in the Past 24 Hours? No  Do you have any current medical co-morbidities that require immediate attention? Yes  Please describe current medical co-morbidities that require immediate attention: signs of dementia, per wife - missed appt for eval  Clinician description of patient physical appearance/behavior: Patient is calm, cooperative and somewhat guarded, preferring to return home "without going through all of this."  What Do You Feel Would Help You the Most Today?  (N/A- pt prefers to return home, but will "talk to the NP")  If access to [facility] was not available, would you have sought care in the Emergency Department? No  Determination of Need Routine (7 days)  Options For Referral Other: Comment (Pt would benefit from neuropsych eval.)

## 2021-07-18 NOTE — ED Provider Notes (Signed)
Behavioral Health Urgent Care Medical Screening Exam  Patient Name: Jason [name] MRN: [id number] Date of Evaluation: 07/18/21 Diagnosis:  Final diagnoses:  Evaluation by psychiatric service required    History of Present illness: AJIT YOHEY is a 80 y.o. male Patient presents to the [facility]/ voluntarily as a walk-in via [employer] for an evaluation. Per [employer], pt called them to request they stand by as he gets his things out of his house. They spoke to wife, who report cognitive decline to include forgetting things, repeating things, and recently wandering and bringing home random women.  Patient states he doesn't need to be evaluated, but told officers he would come when offered, "but I didn't know it would involve all of this."  Patient requesting to leave and return home. Officers are out back in the case he needs transport home  Patient seen and examined face to face by this provider and chart reviewed. On evaluation, patient is alert and oriented x4. He is able to identify himself, location, the month, the president, and current situation. His thought process is logical and speech is coherent. He is calm and cooperative and his mood is euthymic. His affect is congruent with his mood. He reports having a verbal altercation with his wife over a mattress that he wanted to replace. He states that a friend of his came over to help him remove the mattress from the house. He states that when he went to go in the house to remove the mattress his wife had locked the door and yelled "get those "B.I.T.C.H" away from here. He states that he thought about breaking the glass but he did not do that and decided to call the police. He states that when the police came they asked him to come for an evaluation. He states, "I guess they thought I was crazy."  He denies being physically aggressive with anyone including his wife. He denies suicidal ideations. He denies homicidal  ideations. He denies auditory and visual hallucinations. He does not appear to be responding to internal or external stimuli. He denies a previous history of psychiatric diagnosis,  hospitalizations or outpatient treatment. He states that he does not have much going on because he is retired from the [employer] and usually spends his time watching television and doing things around the house. He states that he plans to get a motel for today. Patient transported back home by [employer].     Psychiatric Specialty Exam  Presentation  General Appearance:Appropriate for Environment  Eye Contact:Absent  Speech:Clear and Coherent  Speech Volume:Normal  Handedness:No data recorded  Mood and Affect  Mood:Euthymic  Affect:Appropriate   Thought Process  Thought Processes:Coherent  Descriptions of Associations:Intact  Orientation:Full (Time, Place and Person)  Thought Content:WDL    Hallucinations:None  Ideas of Reference:None  Suicidal Thoughts:No  Homicidal Thoughts:No   Sensorium  Memory:Immediate Fair; Recent Fair; Remote Fair  Judgment:Fair  Insight:Fair   Executive Functions  Concentration:Fair  Attention Span:Fair  [place]   Psychomotor Activity  Psychomotor Activity:Normal   Assets  Assets:Communication Skills; Desire for Improvement; Housing; Catering manager; Leisure Time; Physical Health; Social Support   Sleep  Sleep:Poor Reports sleeping during the day and up at night.   Physical Exam: Physical Exam HENT:     Head: Normocephalic.     Nose: Nose normal.  Eyes:     Conjunctiva/sclera: Conjunctivae normal.  Cardiovascular:     Rate and Rhythm: Normal rate.  Pulmonary:     Effort: Pulmonary effort is normal.  Musculoskeletal:        General: Normal range of motion.     Cervical back: Normal range of motion.  Neurological:     Mental Status: He is alert and oriented to person,  place, and time.   Review of Systems  Constitutional: Negative.   HENT: Negative.    Eyes: Negative.   Respiratory: Negative.    Cardiovascular: Negative.   Gastrointestinal: Negative.   Genitourinary: Negative.   Musculoskeletal: Negative.   Skin: Negative.   Neurological: Negative.   Endo/Heme/Allergies: Negative.   Blood pressure 113/63, pulse 88, temperature 98.3 F (36.8 C), temperature source Oral, resp. rate 18, SpO2 100 %. There is no height or weight on file to calculate BMI.  Musculoskeletal: Strength & Muscle Tone: within normal limits Gait & Station: normal Patient leans: N/A   [place] [facility] Discharge Disposition for Follow up and Recommendations: Based on my evaluation the patient does not appear to have an emergency medical condition and can be discharged with resources and follow up care in outpatient services for Individual Therapy and Group Therapy   Follow-up Information     Call  [name], MD.   Specialty: Internal Medicine Why: If symptoms worsen Contact information: [place] [address] [phone]         Call  [facility].   Specialty: Urgent Care Why: If symptoms worsen for an appointment with psychiatry Contact information: [name] [phone]                 Marissa Calamity, NP 07/18/2021, 12:43 PM

## 2021-07-18 NOTE — ED Notes (Signed)
Discharge instructions provided and Pt stated understanding. Pt alert, orient and ambulatory prior to d/c from facility. Personal belongings returned from the blue locker. Pt escorted to the sally port to meet [employer] for transportation back home. Safety maintained.

## 2021-07-31 ENCOUNTER — Ambulatory Visit: Payer: Medicare PPO | Admitting: Cardiology

## 2021-08-02 ENCOUNTER — Encounter: Payer: Self-pay | Admitting: Cardiology

## 2021-08-02 DIAGNOSIS — I4892 Unspecified atrial flutter: Secondary | ICD-10-CM | POA: Insufficient documentation

## 2021-08-02 NOTE — Progress Notes (Signed)
Cardiology Office Note   Date:  08/03/2021   ID:  MANTAJ CHAMBERLIN, DOB 24-Oct-1941, MRN [id number]  PCP:  [name], MD  Cardiologist:   Minus Breeding, MD   Chief Complaint  Patient presents with   Atrial Flutter       History of Present Illness: OLUWATOBI VISSER is a 80 y.o. male who I saw in 2017.  He had MVP and moderate MR and MS. at the last visit he was noted to have an EKG consistent with flutter waves.  I had him wear a monitor and he does he had flutter 100% of the time but with controlled rate.   I repeated an echo last year.  The MS appears to be mod/severe as it was before with no change in mean gradient although the MV area by PHT is reduced.    He has done well.  He has a limited understanding of his valvular issues and I did show him videos animation's today.  He walks to the mailbox.  He is more limited by just overall fatigue and is lost some toes on his right foot.  He does do some activities like he cut some shrubs this weekend. The patient denies any new symptoms such as chest discomfort, neck or arm discomfort. There has been no new shortness of breath, PND or orthopnea. There have been no reported palpitations, presyncope or syncope.   Past Medical History:  Diagnosis Date   Anxiety    Asthma    in past, no inhaler now   Benign neoplasm of colon    Bone infection ([place])    Cancer ([place])    bladder cancer   COPD (chronic obstructive pulmonary disease) (HCC)    Depression    Diverticulosis of colon (without mention of hemorrhage)    DJD (degenerative joint disease)    Esophageal reflux    Glaucoma    Hidradenitis    History of BPH    History of gynecomastia    Unilateral   Hyperlipidemia    Irritable bowel syndrome    Memory loss    Mitral stenosis    mild-moderate MS 01/2016   MVP (mitral valve prolapse)    Obesity, unspecified    Pneumonia    Sleep apnea    does not use  cpap or bipap .. no study  ever   Type II or unspecified type  diabetes mellitus without mention of complication, not stated as uncontrolled    Unspecified venous (peripheral) insufficiency     Past Surgical History:  Procedure Laterality Date   ACHILLES TENDON SURGERY Right 05/17/2019   Procedure: ACHILLES LENGTHENING/KIDNER;  Surgeon: Evelina Bucy, DPM;  Location: [place];  Service: Podiatry;  Laterality: Right;   AMPUTATION  08/09/2012   Procedure: AMPUTATION DIGIT;  Surgeon: Newt Minion, MD;  Location: [place];  Service: Orthopedics;  Laterality: Right;  Amputation right Great Toe MTP Joint   AMPUTATION  10/17/2012   Procedure: AMPUTATION DIGIT;  Surgeon: Newt Minion, MD;  Location: [place];  Service: Orthopedics;  Laterality: Right;  Right Foot 3rd Toe Amputation at Metatarsophalangeal Joint   AMPUTATION TOE Right 05/17/2019   Procedure: AMPUTATION TOE Metatarsal Phalangeal Joint   FOURTH AND FIFTH, FILLETED TOE FLAP;  Surgeon: Evelina Bucy, DPM;  Location: [place];  Service: Podiatry;  Laterality: Right;   CHOLECYSTECTOMY N/A 06/14/2018   Procedure: LAPAROSCOPIC CHOLECYSTECTOMY WITH INTRAOPERATIVE CHOLANGIOGRAM;  Surgeon: Kieth Brightly Arta Bruce, MD;  Location: [facility];  Service: General;  Laterality: N/A;   CIRCUMCISION  06/2100   For Phimosis - Dr Hartley Barefoot   COLON SURGERY     CYSTOSCOPY W/ RETROGRADES Bilateral 07/07/2018   Procedure: CYSTOSCOPY WITH BILATERAL RETROGRADE PYELOGRAM;  Surgeon: Ardis Hughs, MD;  Location: [facility];  Service: Urology;  Laterality: Bilateral;   KNEE ARTHROSCOPY     LAPAROSCOPIC LYSIS OF ADHESIONS  06/14/2018   Procedure: LAPAROSCOPIC LYSIS OF ADHESIONS;  Surgeon: Kieth Brightly Arta Bruce, MD;  Location: [facility];  Service: General;;   LAPAROTOMY  07/20/11   LASER ABLATION  06/2009   Left Greater Saphenous vein -Dr Elicia Lamp OSTEOTOMY Right 05/17/2019   Procedure: METATARSLSECTOMY FOURTH DIGIT;  Surgeon: Evelina Bucy, DPM;  Location: [place];  Service: Podiatry;  Laterality: Right;   MULTIPLE TOOTH EXTRACTIONS      TOE AMPUTATION  06/2009   x3Right foot second toe -Dr Beola Cord   TONSILLECTOMY     TRANSURETHRAL RESECTION OF BLADDER TUMOR N/A 07/07/2018   Procedure: TRANSURETHRAL RESECTION OF BLADDER TUMOR (TURBT) WITH POST OP INSTILLATION OF GEMCITABIN;  Surgeon: Ardis Hughs, MD;  Location: [facility];  Service: Urology;  Laterality: N/A;   WOUND DEBRIDEMENT Right 07/25/2019   Procedure: Right foot wound debridement with application of skin graft substitute;  Surgeon: Evelina Bucy, DPM;  Location: [facility];  Service: Podiatry;  Laterality: Right;     Current Outpatient Medications  Medication Sig Dispense Refill   amoxicillin-clavulanate (AUGMENTIN) 875-125 MG tablet Take 1 tablet by mouth 2 (two) times daily. 20 tablet 0   Blood Glucose Calibration (ACCU-CHEK AVIVA) SOLN 1 Act by In Vitro route 3 (three) times daily. 1 each 2   blood glucose meter kit and supplies KIT Use to check blood sugar daily. DX: E11. 1 each 0   Blood Glucose Monitoring Suppl (ACCU-CHEK AVIVA PLUS) w/Device KIT 1 Act by Does not apply route 3 (three) times daily. 1 kit 2   Continuous Blood Gluc Receiver (FREESTYLE LIBRE 2 READER) DEVI 1 Act by Does not apply route daily. 2 each 5   Continuous Blood Gluc Sensor (FREESTYLE LIBRE 2 SENSOR) MISC 1 Act by Does not apply route daily. 2 each 5   Glucagon (GVOKE HYPOPEN 2-PACK) 1 MG/0.2ML SOAJ Inject 1 Act into the skin daily as needed. 2 mL 5   glucose blood (ACCU-CHEK AVIVA PLUS) test strip 1 each by Other route 3 (three) times daily. Use as instructed 300 each 2   insulin glargine, 2 Unit Dial, (TOUJEO MAX SOLOSTAR) 300 UNIT/ML Solostar Pen Inject 30 Units into the skin daily. (Patient taking differently: Inject 20 Units into the skin daily.) 9 mL 1   Insulin Pen Needle 32G X 6 MM MISC 1 Act by Does not apply route in the morning and at bedtime. 100 each 3   metFORMIN (GLUCOPHAGE) 500 MG tablet TAKE 2 TABLETS TWICE DAILY WITH MEALS 360 tablet 0   Multiple Vitamins-Minerals  (MULTIVITAMIN WITH MINERALS) tablet Take 1 tablet by mouth daily.     predniSONE (DELTASONE) 10 MG tablet 2 tabs by mouth per day for 5 days 10 tablet 0   rivaroxaban (XARELTO) 20 MG TABS tablet Take 1 tablet (20 mg total) by mouth daily with supper. 90 tablet 3   Semaglutide,0.25 or 0.5MG/DOS, (OZEMPIC, 0.25 OR 0.5 MG/DOSE,) 2 MG/1.5ML SOPN Inject 1 mg into the skin once a week. (Patient taking differently: Inject 0.5 mg into the skin once a week.) 9 mL 1   simvastatin (ZOCOR) 40  MG tablet Take 1 tablet (40 mg total) by mouth daily at 6 PM. 90 tablet 1   thiamine (VITAMIN B-1) 50 MG tablet Take 1 tablet (50 mg total) by mouth daily. 90 tablet 1   triamcinolone cream (KENALOG) 0.1 % Apply 1 application topically 2 (two) times daily. 30 g 0   TRUEplus Safety Lancets 28G MISC 1 Act by Does not apply route in the morning and at bedtime. 200 each 3   vitamin C (ASCORBIC ACID) 500 MG tablet Take 1,000 mg by mouth daily.     Wound Dressings (MEDIHONEY WOUND/BURN DRESSING) GEL Apply to affected are 3 times a week, and cover with sterile dressing. 15 mL 2   No current facility-administered medications for this visit.    Allergies:   Bactrim [sulfamethoxazole-trimethoprim]    ROS:  Please see the history of present illness.   Otherwise, review of systems are positive for none.   All other systems are reviewed and negative.    PHYSICAL EXAM: VS:  BP 126/72   Pulse 67   Ht _0  (2.032 m)   Wt 293 lb 9.6 oz (133.2 kg)   SpO2 95%   BMI 32.25 kg/m  , BMI Body mass index is 32.25 kg/m. GENERAL:  Well appearing NECK:  No jugular venous distention, waveform within normal limits, carotid upstroke brisk and symmetric, no bruits, no thyromegaly LUNGS:  Clear to auscultation bilaterally CHEST:  Unremarkable HEART:  PMI not displaced or sustained,S1 and S2 within normal limits, no S3, no S4, no clicks, no rubs, 2 out of 6 systolic murmur heard at the apex and nonradiating, no diastolic murmurs ABD:   Flat, positive bowel sounds normal in frequency in pitch, no bruits, no rebound, no guarding, no midline pulsatile mass, no hepatomegaly, no splenomegaly EXT:  2 plus pulses throughout, no edema, no cyanosis no clubbing    EKG:  EKG is  ordered today. Normal sinus rhythm, rate 67, first-degree AV block, early transition lead V2, no acute ST-T wave changes.    Recent Labs: 09/30/2020: Hemoglobin 13.1; Platelets 165.0; Pro B Natriuretic peptide (BNP) 117.0 05/07/2021: BUN 13; Creatinine, Ser 1.21; Potassium 4.6; Sodium 136; TSH 0.78    Lipid Panel    Component Value Date/Time   CHOL 147 05/21/2020 1458   TRIG 100 05/21/2020 1458   HDL 49 05/21/2020 1458   CHOLHDL 3.0 05/21/2020 1458   VLDL 16.0 04/18/2019 1035   LDLCALC 79 05/21/2020 1458      Wt Readings from Last 3 Encounters:  08/03/21 293 lb 9.6 oz (133.2 kg)  06/16/21 289 lb (131.1 kg)  05/07/21 295 lb (133.8 kg)      Other studies Reviewed: Additional studies/ records that were reviewed today include: Labs. Review of the above records demonstrates:  See below    ASSESSMENT AND PLAN:  Mitral stenosis:  Mod to severe MS in Sept of last year.  I am going to check a TEE.  I discussed to the patient in detail.  He has no contraindications.  ATRIAL FLUTTER: He is in sinus rhythm.  He has had fib flutter noticed previously on a monitor.  He was previously on Xarelto but we called the pharmacy today and he has not released pick this up since March.  He had no contraindications.   Hyperlipidemia: His LDL was 79 with an HDL of 49.  Continue to the current therapy.    DM2:   A1c was up to 9.3 from 8.2.   He has follow-up  with Dr. Ronnald Ramp.   Current medicines are reviewed at length with the patient today.  The patient does not have concerns regarding medicines.  The following changes have been made:  As above  Labs/ tests ordered today include:    Orders Placed This Encounter  Procedures   CBC   Basic metabolic panel    EKG 62-OECX      Disposition:   FU with APP in 6 months.   Signed, Minus Breeding, MD  08/03/2021 10:11 AM    [facility]

## 2021-08-02 NOTE — H&P (View-Only) (Signed)
Cardiology Office Note   Date:  08/03/2021   ID:  Jason [name], DOB 24-Oct-1941, MRN [id number]  PCP:  [name], MD  Cardiologist:   Minus Breeding, MD   Chief Complaint  Patient presents with   Atrial Flutter       History of Present Illness: Jason [name] is a 80 y.o. male who I saw in 2017.  He had MVP and moderate MR and MS. at the last visit he was noted to have an EKG consistent with flutter waves.  I had him wear a monitor and he does he had flutter 100% of the time but with controlled rate.   I repeated an echo last year.  The MS appears to be mod/severe as it was before with no change in mean gradient although the MV area by PHT is reduced.    He has done well.  He has a limited understanding of his valvular issues and I did show him videos animation's today.  He walks to the mailbox.  He is more limited by just overall fatigue and is lost some toes on his right foot.  He does do some activities like he cut some shrubs this weekend. The patient denies any new symptoms such as chest discomfort, neck or arm discomfort. There has been no new shortness of breath, PND or orthopnea. There have been no reported palpitations, presyncope or syncope.   Past Medical History:  Diagnosis Date   Anxiety    Asthma    in past, no inhaler now   Benign neoplasm of colon    Bone infection ([place])    Cancer ([place])    bladder cancer   COPD (chronic obstructive pulmonary disease) (HCC)    Depression    Diverticulosis of colon (without mention of hemorrhage)    DJD (degenerative joint disease)    Esophageal reflux    Glaucoma    Hidradenitis    History of BPH    History of gynecomastia    Unilateral   Hyperlipidemia    Irritable bowel syndrome    Memory loss    Mitral stenosis    mild-moderate MS 01/2016   MVP (mitral valve prolapse)    Obesity, unspecified    Pneumonia    Sleep apnea    does not use  cpap or bipap .. no study  ever   Type II or unspecified type  diabetes mellitus without mention of complication, not stated as uncontrolled    Unspecified venous (peripheral) insufficiency     Past Surgical History:  Procedure Laterality Date   ACHILLES TENDON SURGERY Right 05/17/2019   Procedure: ACHILLES LENGTHENING/KIDNER;  Surgeon: Evelina Bucy, DPM;  Location: [place];  Service: Podiatry;  Laterality: Right;   AMPUTATION  08/09/2012   Procedure: AMPUTATION DIGIT;  Surgeon: Newt Minion, MD;  Location: [place];  Service: Orthopedics;  Laterality: Right;  Amputation right Great Toe MTP Joint   AMPUTATION  10/17/2012   Procedure: AMPUTATION DIGIT;  Surgeon: Newt Minion, MD;  Location: [place];  Service: Orthopedics;  Laterality: Right;  Right Foot 3rd Toe Amputation at Metatarsophalangeal Joint   AMPUTATION TOE Right 05/17/2019   Procedure: AMPUTATION TOE Metatarsal Phalangeal Joint   FOURTH AND FIFTH, FILLETED TOE FLAP;  Surgeon: Evelina Bucy, DPM;  Location: [place];  Service: Podiatry;  Laterality: Right;   CHOLECYSTECTOMY N/A 06/14/2018   Procedure: LAPAROSCOPIC CHOLECYSTECTOMY WITH INTRAOPERATIVE CHOLANGIOGRAM;  Surgeon: Kieth Brightly Arta Bruce, MD;  Location: [facility];  Service: General;  Laterality: N/A;   CIRCUMCISION  06/2100   For Phimosis - Dr Hartley Barefoot   COLON SURGERY     CYSTOSCOPY W/ RETROGRADES Bilateral 07/07/2018   Procedure: CYSTOSCOPY WITH BILATERAL RETROGRADE PYELOGRAM;  Surgeon: Ardis Hughs, MD;  Location: [facility];  Service: Urology;  Laterality: Bilateral;   KNEE ARTHROSCOPY     LAPAROSCOPIC LYSIS OF ADHESIONS  06/14/2018   Procedure: LAPAROSCOPIC LYSIS OF ADHESIONS;  Surgeon: Kieth Brightly Arta Bruce, MD;  Location: [facility];  Service: General;;   LAPAROTOMY  07/20/11   LASER ABLATION  06/2009   Left Greater Saphenous vein -Dr Elicia Lamp OSTEOTOMY Right 05/17/2019   Procedure: METATARSLSECTOMY FOURTH DIGIT;  Surgeon: Evelina Bucy, DPM;  Location: [place];  Service: Podiatry;  Laterality: Right;   MULTIPLE TOOTH EXTRACTIONS      TOE AMPUTATION  06/2009   x3Right foot second toe -Dr Beola Cord   TONSILLECTOMY     TRANSURETHRAL RESECTION OF BLADDER TUMOR N/A 07/07/2018   Procedure: TRANSURETHRAL RESECTION OF BLADDER TUMOR (TURBT) WITH POST OP INSTILLATION OF GEMCITABIN;  Surgeon: Ardis Hughs, MD;  Location[facility];  Service: Urology;  Laterality: N/A;   WOUND DEBRIDEMENT Right 07/25/2019   Procedure: Right foot wound debridement with application of skin graft substitute;  Surgeon: Evelina Bucy, DPM;  Location[facility];  Service: Podiatry;  Laterality: Right;     Current Outpatient Medications  Medication Sig Dispense Refill   amoxicillin-clavulanate (AUGMENTIN) 875-125 MG tablet Take 1 tablet by mouth 2 (two) times daily. 20 tablet 0   Blood Glucose Calibration (ACCU-CHEK AVIVA) SOLN 1 Act by In Vitro route 3 (three) times daily. 1 each 2   blood glucose meter kit and supplies KIT Use to check blood sugar daily. DX: E11. 1 each 0   Blood Glucose Monitoring Suppl (ACCU-CHEK AVIVA PLUS) w/Device KIT 1 Act by Does not apply route 3 (three) times daily. 1 kit 2   Continuous Blood Gluc Receiver (FREESTYLE LIBRE 2 READER) DEVI 1 Act by Does not apply route daily. 2 each 5   Continuous Blood Gluc Sensor (FREESTYLE LIBRE 2 SENSOR) MISC 1 Act by Does not apply route daily. 2 each 5   Glucagon (GVOKE HYPOPEN 2-PACK) 1 MG/0.2ML SOAJ Inject 1 Act into the skin daily as needed. 2 mL 5   glucose blood (ACCU-CHEK AVIVA PLUS) test strip 1 each by Other route 3 (three) times daily. Use as instructed 300 each 2   insulin glargine, 2 Unit Dial, (TOUJEO MAX SOLOSTAR) 300 UNIT/ML Solostar Pen Inject 30 Units into the skin daily. (Patient taking differently: Inject 20 Units into the skin daily.) 9 mL 1   Insulin Pen Needle 32G X 6 MM MISC 1 Act by Does not apply route in the morning and at bedtime. 100 each 3   metFORMIN (GLUCOPHAGE) 500 MG tablet TAKE 2 TABLETS TWICE DAILY WITH MEALS 360 tablet 0   Multiple Vitamins-Minerals  (MULTIVITAMIN WITH MINERALS) tablet Take 1 tablet by mouth daily.     predniSONE (DELTASONE) 10 MG tablet 2 tabs by mouth per day for 5 days 10 tablet 0   rivaroxaban (XARELTO) 20 MG TABS tablet Take 1 tablet (20 mg total) by mouth daily with supper. 90 tablet 3   Semaglutide,0.25 or 0.5MG/DOS, (OZEMPIC, 0.25 OR 0.5 MG/DOSE,) 2 MG/1.5ML SOPN Inject 1 mg into the skin once a week. (Patient taking differently: Inject 0.5 mg into the skin once a week.) 9 mL 1   simvastatin (ZOCOR) 40  MG tablet Take 1 tablet (40 mg total) by mouth daily at 6 PM. 90 tablet 1   thiamine (VITAMIN B-1) 50 MG tablet Take 1 tablet (50 mg total) by mouth daily. 90 tablet 1   triamcinolone cream (KENALOG) 0.1 % Apply 1 application topically 2 (two) times daily. 30 g 0   TRUEplus Safety Lancets 28G MISC 1 Act by Does not apply route in the morning and at bedtime. 200 each 3   vitamin C (ASCORBIC ACID) 500 MG tablet Take 1,000 mg by mouth daily.     Wound Dressings (MEDIHONEY WOUND/BURN DRESSING) GEL Apply to affected are 3 times a week, and cover with sterile dressing. 15 mL 2   No current facility-administered medications for this visit.    Allergies:   Bactrim [sulfamethoxazole-trimethoprim]    ROS:  Please see the history of present illness.   Otherwise, review of systems are positive for none.   All other systems are reviewed and negative.    PHYSICAL EXAM: VS:  BP 126/72   Pulse 67   Ht _0  (2.032 m)   Wt 293 lb 9.6 oz (133.2 kg)   SpO2 95%   BMI 32.25 kg/m  , BMI Body mass index is 32.25 kg/m. GENERAL:  Well appearing NECK:  No jugular venous distention, waveform within normal limits, carotid upstroke brisk and symmetric, no bruits, no thyromegaly LUNGS:  Clear to auscultation bilaterally CHEST:  Unremarkable HEART:  PMI not displaced or sustained,S1 and S2 within normal limits, no S3, no S4, no clicks, no rubs, 2 out of 6 systolic murmur heard at the apex and nonradiating, no diastolic murmurs ABD:   Flat, positive bowel sounds normal in frequency in pitch, no bruits, no rebound, no guarding, no midline pulsatile mass, no hepatomegaly, no splenomegaly EXT:  2 plus pulses throughout, no edema, no cyanosis no clubbing    EKG:  EKG is  ordered today. Normal sinus rhythm, rate 67, first-degree AV block, early transition lead V2, no acute ST-T wave changes.    Recent Labs: 09/30/2020: Hemoglobin 13.1; Platelets 165.0; Pro B Natriuretic peptide (BNP) 117.0 05/07/2021: BUN 13; Creatinine, Ser 1.21; Potassium 4.6; Sodium 136; TSH 0.78    Lipid Panel    Component Value Date/Time   CHOL 147 05/21/2020 1458   TRIG 100 05/21/2020 1458   HDL 49 05/21/2020 1458   CHOLHDL 3.0 05/21/2020 1458   VLDL 16.0 04/18/2019 1035   LDLCALC 79 05/21/2020 1458      Wt Readings from Last 3 Encounters:  08/03/21 293 lb 9.6 oz (133.2 kg)  06/16/21 289 lb (131.1 kg)  05/07/21 295 lb (133.8 kg)      Other studies Reviewed: Additional studies/ records that were reviewed today include: Labs. Review of the above records demonstrates:  See below    ASSESSMENT AND PLAN:  Mitral stenosis:  Mod to severe MS in Sept of last year.  I am going to check a TEE.  I discussed to the patient in detail.  He has no contraindications.  ATRIAL FLUTTER: He is in sinus rhythm.  He has had fib flutter noticed previously on a monitor.  He was previously on Xarelto but we called the pharmacy today and he has not released pick this up since March.  He had no contraindications.   Hyperlipidemia: His LDL was 79 with an HDL of 49.  Continue to the current therapy.    DM2:   A1c was up to 9.3 from 8.2.   He has follow-up  with Dr. Ronnald Ramp.   Current medicines are reviewed at length with the patient today.  The patient does not have concerns regarding medicines.  The following changes have been made:  As above  Labs/ tests ordered today include:    Orders Placed This Encounter  Procedures   CBC   Basic metabolic panel    EKG 62-OECX      Disposition:   FU with APP in 6 months.   Signed, Minus Breeding, MD  08/03/2021 10:11 AM    [facility]

## 2021-08-03 ENCOUNTER — Encounter: Payer: Self-pay | Admitting: Cardiology

## 2021-08-03 ENCOUNTER — Other Ambulatory Visit: Payer: Self-pay

## 2021-08-03 ENCOUNTER — Ambulatory Visit (INDEPENDENT_AMBULATORY_CARE_PROVIDER_SITE_OTHER): Payer: Medicare PPO | Admitting: Cardiology

## 2021-08-03 VITALS — BP 126/72 | HR 67 | Ht >= 80 in | Wt 293.6 lb

## 2021-08-03 DIAGNOSIS — E118 Type 2 diabetes mellitus with unspecified complications: Secondary | ICD-10-CM | POA: Diagnosis not present

## 2021-08-03 DIAGNOSIS — I4892 Unspecified atrial flutter: Secondary | ICD-10-CM

## 2021-08-03 DIAGNOSIS — E785 Hyperlipidemia, unspecified: Secondary | ICD-10-CM

## 2021-08-03 DIAGNOSIS — I342 Nonrheumatic mitral (valve) stenosis: Secondary | ICD-10-CM | POA: Diagnosis not present

## 2021-08-03 LAB — CBC
Hematocrit: 35.5 % — ABNORMAL LOW (ref 37.5–51.0)
Hemoglobin: 12.1 g/dL — ABNORMAL LOW (ref 13.0–17.7)
MCH: 31.2 pg (ref 26.6–33.0)
MCHC: 34.1 g/dL (ref 31.5–35.7)
MCV: 92 fL (ref 79–97)
Platelets: 176 10*3/uL (ref 150–450)
RBC: 3.88 x10E6/uL — ABNORMAL LOW (ref 4.14–5.80)
RDW: 11.2 % — ABNORMAL LOW (ref 11.6–15.4)
WBC: 4.6 10*3/uL (ref 3.4–10.8)

## 2021-08-03 LAB — BASIC METABOLIC PANEL
BUN/Creatinine Ratio: 10 (ref 10–24)
BUN: 10 mg/dL (ref 8–27)
CO2: 26 mmol/L (ref 20–29)
Calcium: 9.6 mg/dL (ref 8.6–10.2)
Chloride: 102 mmol/L (ref 96–106)
Creatinine, Ser: 0.98 mg/dL (ref 0.76–1.27)
Glucose: 162 mg/dL — ABNORMAL HIGH (ref 65–99)
Potassium: 4.7 mmol/L (ref 3.5–5.2)
Sodium: 140 mmol/L (ref 134–144)
eGFR: 78 mL/min/{1.73_m2} (ref >59)

## 2021-08-03 MED ORDER — RIVAROXABAN 20 MG PO TABS: 20.0000 mg | ORAL_TABLET | Freq: Every day | ORAL | 3 refills | Status: DC

## 2021-08-03 NOTE — Patient Instructions (Signed)
Medication Instructions:  Stop taking Asprin 81 mg Restart Xarelto 20 mg daily  *If you need a refill on your cardiac medications before your next appointment, please call your pharmacy*   Lab Work: Your physician recommends lab work today (CBC, BMP).  If you have labs (blood work) drawn today and your tests are completely normal, you will receive your results only by: [place] (if you have MyChart) OR A paper copy in the mail If you have any lab test that is abnormal or we need to change your treatment, we will call you to review the results.   Testing/Procedures: Your physician has requested that you have a TEE. During a TEE, sound waves are used to create images of your heart. It provides your doctor with information about the size and shape of your heart and how well your heart's chambers and valves are working. In this test, a transducer is attached to the end of a flexible tube that's guided down your throat and into your esophagus (the tube leading from you mouth to your stomach) to get a more detailed image of your heart. You are not awake for the procedure. Please see the instruction sheet given to you today. For further information please visit [email or web address]     Follow-Up: At [facility], you and your health needs are our priority.  As part of our continuing mission to provide you with exceptional heart care, we have created designated Provider Care Teams.  These Care Teams include your primary Cardiologist (physician) and Advanced Practice Providers (APPs -  Physician Assistants and Nurse Practitioners) who all work together to provide you with the care you need, when you need it.  We recommend signing up for the patient portal called "MyChart".  Sign up information is provided on this After Visit Summary.  MyChart is used to connect with patients for Virtual Visits (Telemedicine).  Patients are able to view lab/test results, encounter notes,  upcoming appointments, etc.  Non-urgent messages can be sent to your provider as well.   To learn more about what you can do with MyChart, go to [email or web address].    Your next appointment:   6 month(s)  The format for your next appointment:   In Person  Provider:   APP   You are scheduled for a TEE/Cardioversion/TEE Cardioversion on Wednesday 08/12/21 with Dr. Marisue Ivan.  Please arrive at the [facility] (Main Entrance [facility] at [facility]: [place], [place] [address] at 7:30 am.   DIET: Nothing to eat or drink after midnight except a sip of water with medications (see medication instructions below)  FYI: For your safety, and to allow [place] to monitor your vital signs accurately during the surgery/procedure we request that   if you have artificial nails, gel coating, SNS etc. Please have those removed prior to your surgery/procedure. Not having the nail coverings /polish removed may result in cancellation or delay of your surgery/procedure.   Medication Instructions: Hold morning Insulin   Continue your anticoagulant: Xarelto 20 mg daily  You will need to continue your anticoagulant after your procedure until you  are told by your  Provider that it is safe to stop   Labs: Today (CBC, BMP)   You must have a responsible person to drive you home and stay in the waiting area during your procedure. Failure to do so could result in cancellation.  Bring your insurance cards.  *Special Note: Every effort is made to have your procedure  done on time. Occasionally there are emergencies that occur at the hospital that may cause delays. Please be patient if a delay does occur.

## 2021-08-04 ENCOUNTER — Telehealth: Payer: Medicare PPO

## 2021-08-05 ENCOUNTER — Encounter: Payer: Self-pay | Admitting: *Deleted

## 2021-08-05 NOTE — Telephone Encounter (Signed)
Patient was seen by Dr Percival Spanish 9/19

## 2021-08-06 ENCOUNTER — Telehealth: Payer: Self-pay | Admitting: Pharmacist

## 2021-08-06 NOTE — Progress Notes (Signed)
Chronic Care Management Pharmacy Assistant   Name: Jason [name]  MRN: [id number] DOB: 01/06/1941   Reason for Encounter: Disease State   Conditions to be addressed/monitored: DMII   Recent office visits:  None ID  Recent consult visits:  08/03/21 Minus Breeding, MD-Cardiology (Nonrheumatic mitral valve stenosis) orders : bmp and cbc, med changes: stop Asprin 81 mg and restart xarelto 20 mg  Hospital visits:  Medication Reconciliation was completed by comparing discharge summary, patient's EMR and Pharmacy list, and upon discussion with patient.  Admitted to the hospital on 07/18/21 due to behavioral health. Discharge date was 08/03/21. Discharged from [facility].   Medications that remain the same after Hospital Discharge:??  -All other medications will remain the same.    Medications: Outpatient Encounter Medications as of 08/06/2021  Medication Sig   amoxicillin-clavulanate (AUGMENTIN) 875-125 MG tablet Take 1 tablet by mouth 2 (two) times daily.   Blood Glucose Calibration (ACCU-CHEK AVIVA) SOLN 1 Act by In Vitro route 3 (three) times daily.   blood glucose meter kit and supplies KIT Use to check blood sugar daily. DX: E11.   Blood Glucose Monitoring Suppl (ACCU-CHEK AVIVA PLUS) w/Device KIT 1 Act by Does not apply route 3 (three) times daily.   Continuous Blood Gluc Receiver (FREESTYLE LIBRE 2 READER) DEVI 1 Act by Does not apply route daily.   Continuous Blood Gluc Sensor (FREESTYLE LIBRE 2 SENSOR) MISC 1 Act by Does not apply route daily.   Glucagon (GVOKE HYPOPEN 2-PACK) 1 MG/0.2ML SOAJ Inject 1 Act into the skin daily as needed.   glucose blood (ACCU-CHEK AVIVA PLUS) test strip 1 each by Other route 3 (three) times daily. Use as instructed   insulin glargine, 2 Unit Dial, (TOUJEO MAX SOLOSTAR) 300 UNIT/ML Solostar Pen Inject 30 Units into the skin daily. (Patient taking differently: Inject 20 Units into the skin daily.)   Insulin Pen  Needle 32G X 6 MM MISC 1 Act by Does not apply route in the morning and at bedtime.   metFORMIN (GLUCOPHAGE) 500 MG tablet TAKE 2 TABLETS TWICE DAILY WITH MEALS   Multiple Vitamins-Minerals (MULTIVITAMIN WITH MINERALS) tablet Take 1 tablet by mouth daily.   predniSONE (DELTASONE) 10 MG tablet 2 tabs by mouth per day for 5 days   rivaroxaban (XARELTO) 20 MG TABS tablet Take 1 tablet (20 mg total) by mouth daily with supper.   Semaglutide,0.25 or 0.5MG/DOS, (OZEMPIC, 0.25 OR 0.5 MG/DOSE,) 2 MG/1.5ML SOPN Inject 1 mg into the skin once a week. (Patient taking differently: Inject 0.5 mg into the skin once a week.)   simvastatin (ZOCOR) 40 MG tablet Take 1 tablet (40 mg total) by mouth daily at 6 PM.   thiamine (VITAMIN B-1) 50 MG tablet Take 1 tablet (50 mg total) by mouth daily.   triamcinolone cream (KENALOG) 0.1 % Apply 1 application topically 2 (two) times daily.   TRUEplus Safety Lancets 28G MISC 1 Act by Does not apply route in the morning and at bedtime.   vitamin C (ASCORBIC ACID) 500 MG tablet Take 1,000 mg by mouth daily.   Wound Dressings (MEDIHONEY WOUND/BURN DRESSING) GEL Apply to affected are 3 times a week, and cover with sterile dressing.   No facility-administered encounter medications on file as of 08/06/2021.     Recent Relevant Labs: Lab Results  Component Value Date/Time   HGBA1C 9.3 (H) 05/07/2021 01:54 PM   HGBA1C 8.2 (H) 01/06/2021 11:51 AM   MICROALBUR 2.1 05/21/2020 02:58 PM  MICROALBUR 11.3 (H) 04/18/2019 10:43 AM    Kidney Function Lab Results  Component Value Date/Time   CREATININE 0.98 08/03/2021 10:42 AM   CREATININE 1.21 05/07/2021 01:54 PM   CREATININE 1.00 05/21/2020 02:58 PM   GFR 57.09 (L) 05/07/2021 01:54 PM   GFRNONAA >60 05/17/2019 06:29 AM   GFRAA >60 05/17/2019 06:29 AM     Contacted patient on 08/06/21,08/11/21 and 9/28/ to discuss diabetes disease state.   Current antihyperglycemic regimen:  Toujeo 30 units daily Metformin 500 mg 2 tab  twice daily Ozempic img weekly Glucagon 1 act as needed    What recent interventions/DTPs have been made to improve glycemic control:  None noted  Have there been any recent hospitalizations or ED visits since last visit with CPP? Yes, t behavioral health   Adherence Review: Is the patient currently on a STATIN medication? Yes Is the patient currently on ACE/ARB medication? No Does the patient have >5 day gap between last estimated fill dates? Yes  Care Gaps: Annual wellness visit in last year? No Most Recent BP reading:126/72 08/03/21  If Diabetic: Most recent A1C reading:9.3 05/07/21 Last eye exam / retinopathy screening:04/29/21 Last diabetic foot exam:05/21/20  Star Rating Drugs:  Medication:  Last Fill: Day Supply   PCP appointment on 08/12/21    [place] Pharmacist Assistant [phone]   Time spent: 47

## 2021-08-11 ENCOUNTER — Other Ambulatory Visit: Payer: Self-pay | Admitting: Physician Assistant

## 2021-08-11 NOTE — Progress Notes (Signed)
Dr. Percival Spanish requested assistance with placing TEE orders.

## 2021-08-12 ENCOUNTER — Encounter (HOSPITAL_COMMUNITY): Payer: Self-pay | Admitting: Cardiovascular Disease

## 2021-08-12 ENCOUNTER — Ambulatory Visit: Payer: Medicare PPO | Admitting: Internal Medicine

## 2021-08-12 ENCOUNTER — Ambulatory Visit (HOSPITAL_COMMUNITY)
Admission: RE | Admit: 2021-08-12 | Discharge: 2021-08-12 | Disposition: A | Discharge status: Home / Self Care | Payer: Medicare PPO | Source: Ambulatory Visit | Attending: Cardiovascular Disease | Admitting: Cardiovascular Disease

## 2021-08-12 ENCOUNTER — Encounter (HOSPITAL_COMMUNITY)
Admission: RE | Disposition: A | Discharge status: Home / Self Care | Payer: Self-pay | Source: Ambulatory Visit | Attending: Cardiovascular Disease

## 2021-08-12 ENCOUNTER — Ambulatory Visit (HOSPITAL_COMMUNITY): Payer: Medicare PPO | Admitting: Certified Registered Nurse Anesthetist

## 2021-08-12 ENCOUNTER — Ambulatory Visit (HOSPITAL_BASED_OUTPATIENT_CLINIC_OR_DEPARTMENT_OTHER)
Admission: RE | Admit: 2021-08-12 | Discharge: 2021-08-12 | Disposition: A | Discharge status: Home / Self Care | Payer: Medicare PPO | Source: Ambulatory Visit | Attending: Physician Assistant | Admitting: Physician Assistant

## 2021-08-12 ENCOUNTER — Other Ambulatory Visit: Payer: Self-pay

## 2021-08-12 DIAGNOSIS — I700 Atherosclerosis of aorta: Secondary | ICD-10-CM | POA: Insufficient documentation

## 2021-08-12 DIAGNOSIS — E785 Hyperlipidemia, unspecified: Secondary | ICD-10-CM | POA: Diagnosis not present

## 2021-08-12 DIAGNOSIS — I052 Rheumatic mitral stenosis with insufficiency: Secondary | ICD-10-CM | POA: Insufficient documentation

## 2021-08-12 DIAGNOSIS — Z7901 Long term (current) use of anticoagulants: Secondary | ICD-10-CM | POA: Insufficient documentation

## 2021-08-12 DIAGNOSIS — Z881 Allergy status to other antibiotic agents status: Secondary | ICD-10-CM | POA: Insufficient documentation

## 2021-08-12 DIAGNOSIS — Z79899 Other long term (current) drug therapy: Secondary | ICD-10-CM | POA: Diagnosis not present

## 2021-08-12 DIAGNOSIS — I4892 Unspecified atrial flutter: Secondary | ICD-10-CM | POA: Diagnosis not present

## 2021-08-12 DIAGNOSIS — I083 Combined rheumatic disorders of mitral, aortic and tricuspid valves: Secondary | ICD-10-CM | POA: Diagnosis not present

## 2021-08-12 DIAGNOSIS — Z794 Long term (current) use of insulin: Secondary | ICD-10-CM | POA: Insufficient documentation

## 2021-08-12 DIAGNOSIS — I051 Rheumatic mitral insufficiency: Secondary | ICD-10-CM | POA: Diagnosis not present

## 2021-08-12 DIAGNOSIS — F418 Other specified anxiety disorders: Secondary | ICD-10-CM | POA: Diagnosis not present

## 2021-08-12 DIAGNOSIS — E119 Type 2 diabetes mellitus without complications: Secondary | ICD-10-CM | POA: Diagnosis not present

## 2021-08-12 DIAGNOSIS — I050 Rheumatic mitral stenosis: Secondary | ICD-10-CM | POA: Diagnosis not present

## 2021-08-12 DIAGNOSIS — Q211 Atrial septal defect: Secondary | ICD-10-CM | POA: Diagnosis not present

## 2021-08-12 HISTORY — PX: TEE WITHOUT CARDIOVERSION: SHX5443

## 2021-08-12 HISTORY — PX: BUBBLE STUDY: SHX6837

## 2021-08-12 LAB — ECHO TEE
MV M vel: 5.02 m/s
MV Peak grad: 100.8 mmHg
MV VTI: 1.58 cm2
P 1/2 time: 145 msec
Radius: 0.9 cm

## 2021-08-12 LAB — GLUCOSE, CAPILLARY: Glucose-Capillary: 136 mg/dL — ABNORMAL HIGH (ref 70–99)

## 2021-08-12 SURGERY — ECHOCARDIOGRAM, TRANSESOPHAGEAL
Anesthesia: Monitor Anesthesia Care

## 2021-08-12 MED ORDER — PROPOFOL 10 MG/ML IV BOLUS
INTRAVENOUS | Status: DC | PRN
  Administered 2021-08-12: 10 mg via INTRAVENOUS
  Administered 2021-08-12: 20 mg via INTRAVENOUS

## 2021-08-12 MED ORDER — BUTAMBEN-TETRACAINE-BENZOCAINE 2-2-14 % EX AERO
INHALATION_SPRAY | CUTANEOUS | Status: DC | PRN
  Administered 2021-08-12: 2 via TOPICAL

## 2021-08-12 MED ORDER — PROPOFOL 500 MG/50ML IV EMUL
INTRAVENOUS | Status: DC | PRN
  Administered 2021-08-12: 75 ug/kg/min via INTRAVENOUS

## 2021-08-12 MED ORDER — SODIUM CHLORIDE 0.9 % IV SOLN: INTRAVENOUS | Status: DC

## 2021-08-12 MED ORDER — LIDOCAINE 2% (20 MG/ML) 5 ML SYRINGE
INTRAMUSCULAR | Status: DC | PRN
Start: 2021-08-12 — End: 2021-08-12
  Administered 2021-08-12: 100 mg via INTRAVENOUS

## 2021-08-12 NOTE — Discharge Instructions (Signed)

## 2021-08-12 NOTE — Anesthesia Preprocedure Evaluation (Signed)
Anesthesia Evaluation  Patient identified by MRN, date of birth, ID band Patient awake    Reviewed: Allergy & Precautions, H&P , NPO status , Patient's Chart, lab work & pertinent test results  Airway Mallampati: II   Neck ROM: full    Dental   Pulmonary asthma , sleep apnea , COPD, former smoker,    breath sounds clear to auscultation       Cardiovascular + Peripheral Vascular Disease and + DOE  + Valvular Problems/Murmurs MR  Rhythm:regular Rate:Normal     Neuro/Psych PSYCHIATRIC DISORDERS Anxiety Depression    GI/Hepatic GERD  ,  Endo/Other  diabetes  Renal/GU      Musculoskeletal  (+) Arthritis ,   Abdominal   Peds  Hematology   Anesthesia Other Findings   Reproductive/Obstetrics                             Anesthesia Physical Anesthesia Plan  ASA: 3  Anesthesia Plan: MAC   Post-op Pain Management:    Induction: Intravenous  PONV Risk Score and Plan: 1 and Propofol infusion and Treatment may vary due to age or medical condition  Airway Management Planned: Nasal Cannula  Additional Equipment:   Intra-op Plan:   Post-operative Plan:   Informed Consent: I have reviewed the patients History and Physical, chart, labs and discussed the procedure including the risks, benefits and alternatives for the proposed anesthesia with the patient or authorized representative who has indicated his/her understanding and acceptance.     Dental advisory given  Plan Discussed with: Anesthesiologist, Surgeon and CRNA  Anesthesia Plan Comments:         Anesthesia Quick Evaluation

## 2021-08-12 NOTE — Anesthesia Postprocedure Evaluation (Signed)
Anesthesia Post Note  Patient: Jason [name]  Procedure(s) Performed: TRANSESOPHAGEAL ECHOCARDIOGRAM (TEE) BUBBLE STUDY     Patient location during evaluation: Endoscopy Anesthesia Type: MAC Level of consciousness: awake and alert Pain management: pain level controlled Vital Signs Assessment: post-procedure vital signs reviewed and stable Respiratory status: spontaneous breathing, nonlabored ventilation, respiratory function stable and patient connected to nasal cannula oxygen Cardiovascular status: stable and blood pressure returned to baseline Postop Assessment: no apparent nausea or vomiting Anesthetic complications: no   No notable events documented.  Last Vitals:  Vitals:   08/12/21 0938 08/12/21 0948  BP: (!) 142/59 (!) 145/63  Pulse: 71 67  Resp: 17 18  Temp:    SpO2: 94% 96%    Last Pain:  Vitals:   08/12/21 0948  TempSrc:   PainSc: 0-No pain                 Jagdeep Ancheta S

## 2021-08-12 NOTE — CV Procedure (Signed)
    TRANSESOPHAGEAL ECHOCARDIOGRAM   NAME:  Jason [name]    MRN: [id number] DOB:  Mar 08, 1941    ADMIT DATE: 08/12/2021  INDICATIONS: Severe MS/MR  PROCEDURE:   Informed consent was obtained prior to the procedure. The risks, benefits and alternatives for the procedure were discussed and the patient comprehended these risks.  Risks include, but are not limited to, cough, sore throat, vomiting, nausea, somnolence, esophageal and stomach trauma or perforation, bleeding, low blood pressure, aspiration, pneumonia, infection, trauma to the teeth and death.    Procedural time out performed. The oropharynx was anesthetized with topical 1% benzocaine.    Anesthesia was administered by Dr. Marcie Bal.  The patient was administered 500 mg of propofol and 100 mg of lidocaine to achieve and maintain moderate conscious sedation.  The patient's heart rate, blood pressure, and oxygen saturation are monitored continuously during the procedure. The period of conscious sedation is 30 minutes, of which I was present face-to-face 100% of this time.   The transesophageal probe was inserted in the esophagus and stomach without difficulty and multiple views were obtained.   COMPLICATIONS:    There were no immediate complications.  KEY FINDINGS:  Severe rheumatic MS. Severe MR. Small PFO.  Normal LV/RV function.   Full report to follow. Further management per primary team.   [place] T. Audie Box, MD, [place]  [address], [place], [place] [address] [phone]  9:22 AM

## 2021-08-12 NOTE — Transfer of Care (Signed)
Immediate Anesthesia Transfer of Care Note  Patient: Jason [name]  Procedure(s) Performed: TRANSESOPHAGEAL ECHOCARDIOGRAM (TEE) BUBBLE STUDY  Patient Location: Endoscopy Unit  Anesthesia Type:MAC  Level of Consciousness: awake, alert  and oriented  Airway & Oxygen Therapy: Patient Spontanous Breathing  Post-op Assessment: Report given to RN and Post -op Vital signs reviewed and stable  Post vital signs: Reviewed and stable  Last Vitals:  Vitals Value Taken Time  BP 147/55 08/12/21 0928  Temp    Pulse 72 08/12/21 0929  Resp 24 08/12/21 0929  SpO2 94 % 08/12/21 0929  Vitals shown include unvalidated device data.  Last Pain:  Vitals:   08/12/21 0800  TempSrc: Temporal  PainSc: 0-No pain         Complications: No notable events documented.

## 2021-08-12 NOTE — Interval H&P Note (Signed)
History and Physical Interval Note:  08/12/2021 8:00 AM  Jason [name]  has presented today for surgery, with the diagnosis of [place].  The various methods of treatment have been discussed with the patient and family. After consideration of risks, benefits and other options for treatment, the patient has consented to  Procedure(s): TRANSESOPHAGEAL ECHOCARDIOGRAM (TEE) (N/A) as a surgical intervention.  The patient's history has been reviewed, patient examined, no change in status, stable for surgery.  I have reviewed the patient's chart and labs.  Questions were answered to the patient's satisfaction.    TEE for mitral stenosis. Appears degenerative but cannot exclude rheumatic pathology. NPO since midnight. Risks/benefits explained.   Shared Decision Making/Informed Consent The risks [esophageal damage, perforation (1:10,000 risk), bleeding, pharyngeal hematoma as well as other potential complications associated with conscious sedation including aspiration, arrhythmia, respiratory failure and death], benefits (treatment guidance and diagnostic support) and alternatives of a transesophageal echocardiogram were discussed in detail with [name] and he is willing to proceed.   [place] T. Audie Box, MD, [place]  [address] [place], [place] [address] [phone]  8:01 AM

## 2021-08-13 ENCOUNTER — Telehealth: Payer: Self-pay | Admitting: Cardiology

## 2021-08-13 ENCOUNTER — Other Ambulatory Visit: Payer: Self-pay | Admitting: Internal Medicine

## 2021-08-13 DIAGNOSIS — IMO0002 Reserved for concepts with insufficient information to code with codable children: Secondary | ICD-10-CM

## 2021-08-13 DIAGNOSIS — Z794 Long term (current) use of insulin: Secondary | ICD-10-CM

## 2021-08-13 DIAGNOSIS — E119 Type 2 diabetes mellitus without complications: Secondary | ICD-10-CM

## 2021-08-13 DIAGNOSIS — E1151 Type 2 diabetes mellitus with diabetic peripheral angiopathy without gangrene: Secondary | ICD-10-CM

## 2021-08-13 NOTE — Telephone Encounter (Signed)
Left message for pt to call.

## 2021-08-13 NOTE — Telephone Encounter (Signed)
Patient had a transesophageal echo yesterday and he states now he feels like something is hanging in the back of his throat. He states there is a mild pain with that feeling and it is also interfering with eating. Please return call to discuss.

## 2021-08-14 ENCOUNTER — Encounter (HOSPITAL_COMMUNITY): Payer: Self-pay | Admitting: Cardiovascular Disease

## 2021-08-14 ENCOUNTER — Ambulatory Visit: Payer: Medicare PPO | Admitting: Podiatry

## 2021-08-14 NOTE — Telephone Encounter (Signed)
Left message for pt to call.

## 2021-08-17 ENCOUNTER — Encounter: Payer: Self-pay | Admitting: Internal Medicine

## 2021-08-17 NOTE — Progress Notes (Signed)
    Subjective:    Patient ID: Jason [name], male    DOB: 04/10/1941, 80 y.o.   MRN: [id number]  This visit occurred during the SARS-CoV-2 public health emergency.  Safety protocols were in place, including screening questions prior to the visit, additional usage of staff PPE, and extensive cleaning of exam room while observing appropriate contact time as indicated for disinfecting solutions.     HPI The patient is here for follow up of their chronic medical problems, including DM, hld.  His wife is here with him.    They are both poor historians.   Check a1c - 7.5%  Memory issues - forgets. easily  apparently the police took him to the mental health clinic because they were concerned about his memory and behavior - he states he was sitting outside someplace eating and police came up to him and wanted to know if he had memory issues.   His wife can not provide more information.  Apparently he was told he had memory issues.    Depression - he admits to depression - it is related to son - has a bullet in his head  No exercise.  Has been trying to eat less sugar.  Medications and allergies reviewed with patient and updated if appropriate.  Patient Active Problem List   Diagnosis Date Noted   Atrial flutter (HCC) 08/02/2021   Dental infection 06/16/2021   Contact dermatitis 06/16/2021   Nonrheumatic mitral valve stenosis 01/27/2021   Atherosclerosis of aorta (HCC) 09/30/2020   DOE (dyspnea on exertion) 09/30/2020   Abnormal electrocardiogram (ECG) (EKG) 09/30/2020   Thiamine deficiency 07/04/2020   Ulcer of toe, right, with necrosis of bone (HCC)    Diabetic ulcer of toe of right foot associated with type 2 diabetes mellitus, with fat layer exposed (HCC) 04/18/2019   Malignant neoplasm of urinary bladder (HCC) 07/11/2018   Chronic idiopathic constipation 06/12/2018   Diabetic infection of right foot (HCC) 07/12/2017   Foot ulcer, right, with unspecified severity (HCC)  01/17/2017   Complex sleep apnea syndrome 01/12/2016   Diabetes mellitus type 2, uncontrolled, with complications 01/01/2016   OSA (obstructive sleep apnea) 12/18/2015   Benign prostatic hyperplasia 02/18/2014   Routine general medical examination at a health care facility 02/18/2014   PAD (peripheral artery disease) (HCC) 02/18/2014   DEGENERATIVE JOINT DISEASE 02/20/2008   Diabetes mellitus type 2 with atherosclerosis of arteries of extremities (HCC) 01/26/2008   Hyperlipidemia with target LDL less than 70 01/26/2008   Morbid obesity (HCC) 01/26/2008   Mitral valve disorder 01/26/2008   GERD 01/26/2008   Irritable bowel syndrome 01/26/2008    Current Outpatient Medications on File Prior to Visit  Medication Sig Dispense Refill   Blood Glucose Calibration (ACCU-CHEK AVIVA) SOLN 1 Act by In Vitro route 3 (three) times daily. 1 each 2   blood glucose meter kit and supplies KIT Use to check blood sugar daily. DX: E11. 1 each 0   Blood Glucose Monitoring Suppl (ACCU-CHEK AVIVA PLUS) w/Device KIT 1 Act by Does not apply route 3 (three) times daily. 1 kit 2   Continuous Blood Gluc Receiver (FREESTYLE LIBRE 2 READER) DEVI 1 Act by Does not apply route daily. 2 each 5   Continuous Blood Gluc Sensor (FREESTYLE LIBRE 2 SENSOR) MISC 1 Act by Does not apply route daily. 2 each 5   Glucagon (GVOKE HYPOPEN 2-PACK) 1 MG/0.2ML SOAJ Inject 1 Act into the skin daily as needed. 2 mL 5     glucose blood (ACCU-CHEK AVIVA PLUS) test strip 1 each by Other route 3 (three) times daily. Use as instructed 300 each 2   Insulin Pen Needle 32G X 6 MM MISC 1 Act by Does not apply route in the morning and at bedtime. 100 each 3   metFORMIN (GLUCOPHAGE) 500 MG tablet TAKE 2 TABLETS TWICE DAILY WITH MEALS (Patient taking differently: Take 1,000 mg by mouth 2 (two) times daily with a meal.) 360 tablet 0   Multiple Vitamins-Minerals (MULTIVITAMIN WITH MINERALS) tablet Take 1 tablet by mouth daily.     rivaroxaban (XARELTO)  20 MG TABS tablet Take 1 tablet (20 mg total) by mouth daily with supper. 90 tablet 3   Semaglutide,0.25 or 0.5MG/DOS, (OZEMPIC, 0.25 OR 0.5 MG/DOSE,) 2 MG/1.5ML SOPN Inject 1 mg into the skin once a week. (Patient taking differently: Inject 1 mg into the skin every Monday.) 9 mL 1   simvastatin (ZOCOR) 40 MG tablet Take 1 tablet (40 mg total) by mouth daily at 6 PM. 90 tablet 1   TOUJEO MAX SOLOSTAR 300 UNIT/ML Solostar Pen INJECT 30 UNITS INTO THE SKIN DAILY. 12 mL 0   triamcinolone cream (KENALOG) 0.1 % Apply 1 application topically 2 (two) times daily. 30 g 0   TRUEplus Safety Lancets 28G MISC 1 Act by Does not apply route in the morning and at bedtime. 200 each 3   Wound Dressings (MEDIHONEY WOUND/BURN DRESSING) GEL Apply to affected are 3 times a week, and cover with sterile dressing. 15 mL 2   B-D UF III MINI PEN NEEDLES 31G X 5 MM MISC SMARTSIG:SUB-Q Morning-Night     No current facility-administered medications on file prior to visit.    Past Medical History:  Diagnosis Date   Anxiety    Asthma    in past, no inhaler now   Benign neoplasm of colon    Bone infection (HCC)    Cancer (HCC)    bladder cancer   COPD (chronic obstructive pulmonary disease) (HCC)    Depression    Diverticulosis of colon (without mention of hemorrhage)    DJD (degenerative joint disease)    Esophageal reflux    Glaucoma    Hidradenitis    History of BPH    History of gynecomastia    Unilateral   Hyperlipidemia    Irritable bowel syndrome    Memory loss    Mitral stenosis    mild-moderate MS 01/2016   MVP (mitral valve prolapse)    Obesity, unspecified    Pneumonia    Sleep apnea    does not use  cpap or bipap .. no study  ever   Type II or unspecified type diabetes mellitus without mention of complication, not stated as uncontrolled    Unspecified venous (peripheral) insufficiency     Past Surgical History:  Procedure Laterality Date   ACHILLES TENDON SURGERY Right 05/17/2019    Procedure: ACHILLES LENGTHENING/KIDNER;  Surgeon: Price, [name] J, DPM;  Location: [facility];  Service: Podiatry;  Laterality: Right;   AMPUTATION  08/09/2012   Procedure: AMPUTATION DIGIT;  Surgeon: Marcus V Duda, MD;  Location: [facility];  Service: Orthopedics;  Laterality: Right;  Amputation right Great Toe MTP Joint   AMPUTATION  10/17/2012   Procedure: AMPUTATION DIGIT;  Surgeon: Marcus V Duda, MD;  Location: [facility];  Service: Orthopedics;  Laterality: Right;  Right Foot 3rd Toe Amputation at Metatarsophalangeal Joint   AMPUTATION TOE Right 05/17/2019   Procedure: AMPUTATION TOE Metatarsal Phalangeal Joint     FOURTH AND FIFTH, FILLETED TOE FLAP;  Surgeon: Evelina Bucy, DPM;  Location: [facility];  Service: Podiatry;  Laterality: Right;   BUBBLE STUDY  08/12/2021   Procedure: BUBBLE STUDY;  Surgeon: Geralynn Rile, MD;  Location: [facility];  Service: Cardiovascular;;   CHOLECYSTECTOMY N/A 06/14/2018   Procedure: LAPAROSCOPIC CHOLECYSTECTOMY WITH INTRAOPERATIVE CHOLANGIOGRAM;  Surgeon: Mickeal Skinner, MD;  Location: [facility];  Service: General;  Laterality: N/A;   CIRCUMCISION  06/2100   For Phimosis - Dr Hartley Barefoot   COLON SURGERY     CYSTOSCOPY W/ RETROGRADES Bilateral 07/07/2018   Procedure: CYSTOSCOPY WITH BILATERAL RETROGRADE PYELOGRAM;  Surgeon: Ardis Hughs, MD;  Location: [facility];  Service: Urology;  Laterality: Bilateral;   KNEE ARTHROSCOPY     LAPAROSCOPIC LYSIS OF ADHESIONS  06/14/2018   Procedure: LAPAROSCOPIC LYSIS OF ADHESIONS;  Surgeon: Kieth Brightly Arta Bruce, MD;  Location: [facility];  Service: General;;   LAPAROTOMY  07/20/11   LASER ABLATION  06/2009   Left Greater Saphenous vein -Dr Elicia Lamp OSTEOTOMY Right 05/17/2019   Procedure: METATARSLSECTOMY FOURTH DIGIT;  Surgeon: Evelina Bucy, DPM;  Location: [facility];  Service: Podiatry;  Laterality: Right;   MULTIPLE TOOTH EXTRACTIONS     TEE WITHOUT CARDIOVERSION N/A 08/12/2021   Procedure: TRANSESOPHAGEAL ECHOCARDIOGRAM  (TEE);  Surgeon: Geralynn Rile, MD;  Location: [facility];  Service: Cardiovascular;  Laterality: N/A;   TOE AMPUTATION  06/2009   x3Right foot second toe -Dr Beola Cord   TONSILLECTOMY     TRANSURETHRAL RESECTION OF BLADDER TUMOR N/A 07/07/2018   Procedure: TRANSURETHRAL RESECTION OF BLADDER TUMOR (TURBT) WITH POST OP INSTILLATION OF GEMCITABIN;  Surgeon: Ardis Hughs, MD;  [facility];  Service: Urology;  Laterality: N/A;   WOUND DEBRIDEMENT Right 07/25/2019   Procedure: Right foot wound debridement with application of skin graft substitute;  Surgeon: Evelina Bucy, DPM;  Location[facility];  Service: Podiatry;  Laterality: Right;    Social History   Socioeconomic History   Marital status: Married    Spouse name: Peter Congo   Number of children: 2   Years of education: Not on file   Highest education level: Not on file  Occupational History   Occupation: retired  Tobacco Use   Smoking status: Former    Packs/day: 0.10    Years: 5.00    Pack years: 0.50    Types: Cigarettes    Quit date: 05/10/2015    Years since quitting: 6.2   Smokeless tobacco: Never  Vaping Use   Vaping Use: Never used  Substance and Sexual Activity   Alcohol use: Not Currently    Alcohol/week: 7.0 standard drinks    Types: 7 Cans of beer per week    Comment: social can of beer NONE RECENTLY   Drug use: No   Sexual activity: Not Currently  Other Topics Concern   Not on file  Social History Narrative   Not on file   Social Determinants of Health   Financial Resource Strain: Medium Risk   Difficulty of Paying Living Expenses: Somewhat hard  Food Insecurity: Food Insecurity Present   Worried About Running Out of Food in the Last Year: Sometimes true   Ran Out of Food in the Last Year: Sometimes true  Transportation Needs: No Transportation Needs   Lack of Transportation (Medical): No   Lack of Transportation (Non-Medical): No  Physical Activity: Not on file  Stress: Not on file   Social Connections: Not on file  Family History  Problem Relation Age of Onset   Diabetes Father    Cancer Father        Prostate   Stroke Father    Prostate cancer Father    High blood pressure Other    Colon cancer Neg Hx    Esophageal cancer Neg Hx    Rectal cancer Neg Hx    Stomach cancer Neg Hx     Review of Systems  Constitutional:  Negative for appetite change, chills and fever.  Respiratory:  Positive for cough (dry), shortness of breath (new - walks to kitchen and gets SOB) and wheezing.   Cardiovascular:  Positive for leg swelling (minimal -controlled). Negative for chest pain and palpitations.  Neurological:  Negative for light-headedness and headaches.  Psychiatric/Behavioral:  Positive for dysphoric mood and sleep disturbance (interrupted sleep - gets enough sleep). The patient is nervous/anxious (sometimes).       Objective:   Vitals:   08/18/21 1055  BP: 120/60  Pulse: 65  Temp: 98.4 F (36.9 C)  SpO2: 98%   BP Readings from Last 3 Encounters:  08/18/21 120/60  08/12/21 (!) 145/63  08/03/21 126/72   Wt Readings from Last 3 Encounters:  08/18/21 289 lb (131.1 kg)  08/12/21 296 lb (134.3 kg)  08/03/21 293 lb 9.6 oz (133.2 kg)   Body mass index is 31.75 kg/m.   Physical Exam    Constitutional: Appears well-developed and well-nourished. No distress.  HENT:  Head: Normocephalic and atraumatic.  Neck: Neck supple. No tracheal deviation present. No thyromegaly present.  No cervical lymphadenopathy Cardiovascular: Normal rate, regular rhythm and normal heart sounds.  2/6 sys  murmur heard. No carotid bruit .  Trace b/l LE edema Pulmonary/Chest: Effort normal and breath sounds normal. No respiratory distress. No has no wheezes. No rales.  Skin: Skin is warm and dry. Not diaphoretic.  Psychiatric: Normal mood and affect. Behavior is normal.      Assessment & Plan:    See Problem List for Assessment and Plan of chronic medical problems.      

## 2021-08-17 NOTE — Telephone Encounter (Signed)
Left message for pt to call on mobile number and home number.

## 2021-08-18 ENCOUNTER — Encounter: Payer: Self-pay | Admitting: *Deleted

## 2021-08-18 ENCOUNTER — Telehealth: Payer: Medicare PPO

## 2021-08-18 ENCOUNTER — Telehealth: Payer: Self-pay | Admitting: *Deleted

## 2021-08-18 ENCOUNTER — Other Ambulatory Visit: Payer: Self-pay

## 2021-08-18 ENCOUNTER — Ambulatory Visit: Payer: Medicare PPO | Admitting: Internal Medicine

## 2021-08-18 VITALS — BP 120/60 | HR 65 | Temp 98.4°F | Ht >= 80 in | Wt 289.0 lb

## 2021-08-18 DIAGNOSIS — I70209 Unspecified atherosclerosis of native arteries of extremities, unspecified extremity: Secondary | ICD-10-CM

## 2021-08-18 DIAGNOSIS — F3289 Other specified depressive episodes: Secondary | ICD-10-CM

## 2021-08-18 DIAGNOSIS — E1151 Type 2 diabetes mellitus with diabetic peripheral angiopathy without gangrene: Secondary | ICD-10-CM | POA: Diagnosis not present

## 2021-08-18 DIAGNOSIS — R413 Other amnesia: Secondary | ICD-10-CM | POA: Diagnosis not present

## 2021-08-18 DIAGNOSIS — F32A Depression, unspecified: Secondary | ICD-10-CM | POA: Insufficient documentation

## 2021-08-18 DIAGNOSIS — E785 Hyperlipidemia, unspecified: Secondary | ICD-10-CM

## 2021-08-18 LAB — POCT GLYCOSYLATED HEMOGLOBIN (HGB A1C)
HbA1c POC (<> result, manual entry): 7.5 % (ref 4.0–5.6)
HbA1c, POC (controlled diabetic range): 7.5 % — AB (ref 0.0–7.0)
HbA1c, POC (prediabetic range): 7.5 % — AB (ref 5.7–6.4)
Hemoglobin A1C: 7.5 % — AB (ref 4.0–5.6)

## 2021-08-18 NOTE — Assessment & Plan Note (Signed)
New He is new to me and I am not sure of his baseline Both him and his wife admit to memory issues - the story he tells is unreliable - there was likely some behavioral issues Will refer to neurology and neuropsychology for further evaluation

## 2021-08-18 NOTE — Assessment & Plan Note (Signed)
Chronic Lab Results  Component Value Date   HGBA1C 7.5 (A) 08/18/2021   HGBA1C 7.5 08/18/2021   HGBA1C 7.5 (A) 08/18/2021   HGBA1C 7.5 (A) 08/18/2021   a1c is improved He admits he is not complaint with his medication - his wife tries to monitor them but she is not able to do that well Stressed compliance with medication and importance of taking them daily  Stressed compliance with diabetic diet - he has cut down on ice cream

## 2021-08-18 NOTE — Assessment & Plan Note (Signed)
New He admits to depression - mostly regarding his son who has a bullet in his head ? Contributing to memory issues Deferred any medication He would like to talk to someone - referred to therapist at behavioral health

## 2021-08-18 NOTE — Assessment & Plan Note (Signed)
Chronic Lab Results  Component Value Date   LDLCALC 79 05/21/2020   Continue simvastatin 40 mg daily

## 2021-08-18 NOTE — Telephone Encounter (Signed)
  Chronic Care Management   Follow Up Note   08/18/2021 Name: Jason [name] MRN: [id number] DOB: 03-19-1941   Referred by: [name], MD Reason for referral : Chronic Care Management (CCM RN CM Follow Up Telephone Visit: Unsuccessful attempt)  An unsuccessful telephone outreach was attempted today. The patient was referred to the case management team for assistance with care management and care coordination.   Received automated outgoing Voice Mail message stating patient has a Voice Mail box that has not been set up yet- unable to leave message requesting call-back  Follow Up Plan:  Will place request with scheduling care guide to contact patient to re-schedule today's missed CCM RN follow up telephone appointment  [name], RN, BSN, [facility] [phone]: direct office [phone]: mobile

## 2021-08-18 NOTE — Patient Instructions (Addendum)
  Your a1c is 7.5% - this is better, but still a little high.  Work on taking your medication every day.    Medications changes include :   none   A referral was ordered for  neurology and neurosurgery and a psychologist.       Someone from their office will call you to schedule an appointment.    Please followup in 3 months with Dr Ronnald Ramp

## 2021-08-20 NOTE — Telephone Encounter (Signed)
Unable to contact pt. 

## 2021-08-21 ENCOUNTER — Telehealth: Payer: Self-pay | Admitting: *Deleted

## 2021-08-21 NOTE — Chronic Care Management (AMB) (Signed)
  Care Management   Note  08/21/2021 Name: [name] MRN: [id number] DOB: [birth year]  Jason [name] is a 80 y.o. year old male who is a primary care patient of [name], MD and is actively engaged with the care management team. I reached out to Lucile Shutters by phone today to assist with re-scheduling a follow up visit with the RN Case Manager  Follow up plan: Unsuccessful telephone outreach attempt made. A HIPAA compliant phone message was left for the patient providing contact information and requesting a return call.  The care management team will reach out to the patient again over the next 7 days.  If patient returns call to provider office, please advise to call [facility] at [phone].  [place] Management  Direct Dial: [phone]

## 2021-08-31 NOTE — Chronic Care Management (AMB) (Signed)
  Care Management   Note  08/31/2021 Name: Jason [name] MRN: [id number] DOB: Jul 24, 1941  Jason [name] is a 80 y.o. year old male who is a primary care patient of [name], MD and is actively engaged with the care management team. I reached out to Lucile Shutters by phone today to assist with re-scheduling a follow up visit with the RN Case Manager  Follow up plan: Unsuccessful telephone outreach attempt made. A HIPAA compliant phone message was left for the patient providing contact information and requesting a return call. The care management team will reach out to the patient again over the next 7 days. If patient returns call to provider office, please advise to call [facility] at [phone].  [facility] Management  Direct Dial: [phone]

## 2021-09-03 ENCOUNTER — Encounter: Payer: Self-pay | Admitting: Physician Assistant

## 2021-09-07 NOTE — Chronic Care Management (AMB) (Signed)
  Care Management   Note  09/07/2021 Name: Jason [name] MRN: [id number] DOB: 04-Nov-1941  Jason [name] is a 80 y.o. year old male who is a primary care patient of [name], MD and is actively engaged with the care management team. I reached out to Lucile Shutters by phone today to assist with re-scheduling a follow up visit with the RN Case Manager  Follow up plan: Telephone appointment with care management team member scheduled for:09/17/21  [facility] Management  Direct Dial: [phone]

## 2021-09-07 NOTE — Chronic Care Management (AMB) (Signed)
  Care Management   Note  09/07/2021 Name: Jason [name] MRN: [id number] DOB: 1941-09-06  Jason [name] is a 80 y.o. year old male who is a primary care patient of [name], MD and is actively engaged with the care management team. I reached out to Lucile Shutters by phone today to assist with re-scheduling a follow up visit with the RN Case Manager  Follow up plan: A telephone outreach attempt made, spoke with spouse and gave contact information for patient to return call. A HIPAA compliant phone message was left for the patient providing contact information and requesting a return call.  The care management team will reach out to the patient again over the next 7 days.  If patient returns call to provider office, please advise to call [facility] at [phone].  [facility] Management  Direct Dial: [phone]

## 2021-09-08 ENCOUNTER — Telehealth: Payer: Medicare PPO

## 2021-09-08 NOTE — Progress Notes (Deleted)
Chronic Care Management Pharmacy Note  09/08/2021 Name:  Jason [name] MRN:  [id number] DOB:  07/13/1941  Summary: -Pt is not compliant with Ozempic, he tries to take it on Mondays but often forgets. He is not sure if he took it today -Fasting BG is at goal, post-prandial BG in 200-300 range -Per cardiology- planning to transition Xarelto to warfarin due to cost; pt has not returned call to schedule  Recommendations/Changes made from today's visit: -Advised pt to set phone alarm for Ozempic to repeat on Mondays -Advised pt to call cardiology to schedule Coumadin clinic visit   Subjective: Jason [name] is an 80 y.o. year old male who is a primary patient of [name], MD.  The CCM team was consulted for assistance with disease management and care coordination needs.    Engaged with patient by telephone for follow up visit in response to provider referral for pharmacy case management and/or care coordination services.   Consent to Services:  The patient was given information about Chronic Care Management services, agreed to services, and gave verbal consent prior to initiation of services.  Please see initial visit note for detailed documentation.   Patient Care Team: [name], MD as PCP - General (Internal Medicine) Minus Breeding, MD as PCP - Cardiology (Cardiology) Charlton Haws, [facility] as Pharmacist (Pharmacist) Knox Royalty, RN as Case Manager  Recent office visits: 08/18/21 Dr Quay Burow OV: chronic f/u; A1c 7.5%; concern for behavioral/memory issues, referred to neuropsych and neurology  05/07/21 Dr Ronnald Ramp OV: chronic f/u; A1c up to 9.3. Pt noncompliant with meds. Advised to restart insulin (Toujeo).  01/06/21 Dr Ronnald Ramp OV: DM f/u, A1c improved to 8.2%. No changes.  09/30/20 Dr Ronnald Ramp OV: DM f/u, started Toujeo and Gvoke.  Recent consult visits: 08/03/21 Dr Percival Spanish (cardiology): f/u Aflutter. Restart Xarelto 20 mg daily.  01/01/21 Dr Percival Spanish  (cardiology): EKG c/w A flutter, has been on low-dose Xarelto. Changed to full dose 20 mg, and stop aspirin. Trouble with finger sticks?  11/03/20 RD Levie Heritage: Ozempic pen instruction  Hospital visits: None in previous 6 months  Objective:  Lab Results  Component Value Date   CREATININE 0.98 08/03/2021   BUN 10 08/03/2021   GFR 57.09 (L) 05/07/2021   GFRNONAA >60 05/17/2019   GFRAA >60 05/17/2019   NA 140 08/03/2021   K 4.7 08/03/2021   CALCIUM 9.6 08/03/2021   CO2 26 08/03/2021   GLUCOSE 162 (H) 08/03/2021    Lab Results  Component Value Date/Time   HGBA1C 7.5 (A) 08/18/2021 02:40 PM   HGBA1C 7.5 08/18/2021 02:40 PM   HGBA1C 7.5 (A) 08/18/2021 02:40 PM   HGBA1C 7.5 (A) 08/18/2021 02:40 PM   HGBA1C 9.3 (H) 05/07/2021 01:54 PM   HGBA1C 8.2 (H) 01/06/2021 11:51 AM   GFR 57.09 (L) 05/07/2021 01:54 PM   GFR 76.50 01/06/2021 11:51 AM   MICROALBUR 2.1 05/21/2020 02:58 PM   MICROALBUR 11.3 (H) 04/18/2019 10:43 AM    Last diabetic Eye exam:  Lab Results  Component Value Date/Time   HMDIABEYEEXA No Retinopathy 04/29/2021 12:00 AM    Last diabetic Foot exam:  Lab Results  Component Value Date/Time   HMDIABFOOTEX done 08/09/2014 12:00 AM     Lab Results  Component Value Date   CHOL 147 05/21/2020   HDL 49 05/21/2020   LDLCALC 79 05/21/2020   TRIG 100 05/21/2020   CHOLHDL 3.0 05/21/2020    Hepatic Function Latest Ref Rng & Units 05/21/2020 05/17/2019  04/18/2019  Total Protein 6.1 - 8.1 g/dL 6.7 6.5 7.1  Albumin 3.5 - 5.0 g/dL - 3.6 4.1  AST 10 - 35 U/L _0 ALT 9 - 46 U/L _1 Alk Phosphatase 38 - 126 U/L - 32(L) 43  Total Bilirubin 0.2 - 1.2 mg/dL 0.6 1.5(H) 0.9  Bilirubin, Direct 0.0 - 0.2 mg/dL 0.2 - -    Lab Results  Component Value Date/Time   TSH 0.78 05/07/2021 01:54 PM   TSH 0.85 04/18/2019 10:35 AM    CBC Latest Ref Rng & Units 08/03/2021 09/30/2020 06/30/2020  WBC 3.4 - 10.8 x10E3/uL 4.6 4.1 4.6  Hemoglobin 13.0 - 17.7 g/dL 12.1(L) 13.1  12.6(L)  Hematocrit 37.5 - 51.0 % 35.5(L) 39.1 38.3(L)  Platelets 150 - 450 x10E3/uL 176 165.0 159    No results found for: VD25OH  Clinical ASCVD: Yes  - PAD The 10-year ASCVD risk score (Arnett DK, et al., 2019) is: 36.5%   Values used to calculate the score:     Age: 56 years     Sex: Male     Is Non-Hispanic African American: Yes     Diabetic: Yes     Tobacco smoker: Yes     Systolic Blood Pressure: 808 mmHg     Is BP treated: No     HDL Cholesterol: 49 mg/dL     Total Cholesterol: 147 mg/dL    Depression screen [facility] 2/9 05/13/2021 10/20/2020 12/31/2019  Decreased Interest 0 1 2  Down, Depressed, Hopeless 1 2 0  PHQ - 2 Score _2 Altered sleeping - - 1  Tired, decreased energy - - 1  Change in appetite - - 1  Feeling bad or failure about yourself  - - 0  Trouble concentrating - - 1  Moving slowly or fidgety/restless - - 1  Suicidal thoughts - - 0  PHQ-9 Score - - 7  Difficult doing work/chores - - Not difficult at all  Some recent data might be hidden    CHA2DS2-VASc Score = 5  The patient's score is based upon: CHF History: 0 HTN History: 1 Diabetes History: 1 Stroke History: 0 Vascular Disease History: 1 Age Score: 2 Gender Score: 0      Social History   Tobacco Use  Smoking Status Former   Packs/day: 0.10   Years: 5.00   Pack years: 0.50   Types: Cigarettes   Quit date: 05/10/2015   Years since quitting: 6.3  Smokeless Tobacco Never   BP Readings from Last 3 Encounters:  08/18/21 120/60  08/12/21 (!) 145/63  08/03/21 126/72   Pulse Readings from Last 3 Encounters:  08/18/21 65  08/12/21 67  08/03/21 67   Wt Readings from Last 3 Encounters:  08/18/21 289 lb (131.1 kg)  08/12/21 296 lb (134.3 kg)  08/03/21 293 lb 9.6 oz (133.2 kg)   BMI Readings from Last 3 Encounters:  08/18/21 31.75 kg/m  08/12/21 32.52 kg/m  08/03/21 32.25 kg/m    Assessment/Interventions: Review of patient past medical history, allergies, medications, health  status, including review of consultants reports, laboratory and other test data, was performed as part of comprehensive evaluation and provision of chronic care management services.   SDOH:  (Social Determinants of Health) assessments and interventions performed: Yes   SDOH Screenings   Alcohol Screen: Not on file  Depression (PHQ2-9): Low Risk    PHQ-2 Score: 1  Financial Resource Strain: Medium Risk   Difficulty of Paying Living  Expenses: Somewhat hard  Food Insecurity: Landscape architect Present   Worried About Charity fundraiser in the Last Year: Sometimes true   Arboriculturist in the Last Year: Sometimes true  Housing: Medium Risk   Last Housing Risk Score: 1  Physical Activity: Not on file  Social Connections: Not on file  Stress: Not on file  Tobacco Use: Medium Risk   Smoking Tobacco Use: Former   Smokeless Tobacco Use: Never   Passive Exposure: Not on file  Transportation Needs: No Transportation Needs   Lack of Transportation (Medical): No   Lack of Transportation (Non-Medical): No    CCM Care Plan  Allergies  Allergen Reactions   Bactrim [Sulfamethoxazole-Trimethoprim] Rash    Medications Reviewed Today     Reviewed by Knox Royalty, RN (Registered Nurse) on 08/18/21 at [phone]  Med List Status: <None>   Medication Order Taking? Sig Documenting Provider Last Dose Status Informant  Blood Glucose Calibration (ACCU-CHEK AVIVA) SOLN [id number] No 1 Act by In Vitro route 3 (three) times daily. [name], MD 08/11/2021 Active Self  blood glucose meter kit and supplies KIT [id number] No Use to check blood sugar daily. DX: E11. Hoyt Koch, MD 08/11/2021 Active Self  Blood Glucose Monitoring Suppl (ACCU-CHEK AVIVA PLUS) w/Device KIT [id number] No 1 Act by Does not apply route 3 (three) times daily. [name], MD 08/11/2021 Active Self  Continuous Blood Gluc Receiver (FREESTYLE LIBRE 2 READER) DEVI [id number] No 1 Act by Does not apply route daily. [name], MD 08/11/2021 Active Self  Continuous Blood Gluc Sensor (FREESTYLE LIBRE 2 SENSOR) MISC [id number] No 1 Act by Does not apply route daily. [name], MD 08/11/2021 Active Self  Glucagon (GVOKE HYPOPEN 2-PACK) 1 MG/0.2ML SOAJ [id number] No Inject 1 Act into the skin daily as needed. [name], MD 08/11/2021 Active Self  glucose blood (ACCU-CHEK AVIVA PLUS) test strip [id number] No 1 each by Other route 3 (three) times daily. Use as instructed [name], MD 08/11/2021 Active Self  Insulin Pen Needle 32G X 6 MM MISC [id number] No 1 Act by Does not apply route in the morning and at bedtime. [name], MD 08/11/2021 Active Self  metFORMIN (GLUCOPHAGE) 500 MG tablet [id number] No TAKE 2 TABLETS TWICE DAILY WITH MEALS  Patient taking differently: Take 1,000 mg by mouth 2 (two) times daily with a meal.   [name], MD 08/11/2021 Active   Multiple Vitamins-Minerals (MULTIVITAMIN WITH MINERALS) tablet [id number] No Take 1 tablet by mouth daily. [provider] 08/11/2021 Active Self  rivaroxaban (XARELTO) 20 MG TABS tablet [id number] No Take 1 tablet (20 mg total) by mouth daily with supper. Minus Breeding, MD 08/11/2021 Active Self  Semaglutide,0.25 or 0.5MG/DOS, (OZEMPIC, 0.25 OR 0.5 MG/DOSE,) 2 MG/1.5ML SOPN [id number] No Inject 1 mg into the skin once a week.  Patient taking differently: Inject 1 mg into the skin every Monday.   [name], MD 08/11/2021 Active   simvastatin (ZOCOR) 40 MG tablet [id number] No Take 1 tablet (40 mg total) by mouth daily at 6 PM. [name], MD Unknown Active Self  TOUJEO MAX SOLOSTAR 300 UNIT/ML Solostar Pen [id number]  INJECT 30 UNITS INTO THE SKIN DAILY. [name], MD  Active   triamcinolone cream (KENALOG) 0.1 % [id number] No Apply 1 application topically 2 (two) times daily. Biagio Borg, MD 08/11/2021 Active Self  TRUEplus Safety Lancets 28G MISC [id number] No 1 Act by  Does not apply route in the morning and at bedtime.  [name], MD 08/11/2021 Active Self  Wound Dressings (MEDIHONEY WOUND/BURN DRESSING) GEL [id number] No Apply to affected are 3 times a week, and cover with sterile dressing. [name], MD 08/11/2021 Active Self            Patient Active Problem List   Diagnosis Date Noted   Memory difficulties 08/18/2021   Depression 08/18/2021   Atrial flutter ([place]) 08/02/2021   Dental infection 06/16/2021   Contact dermatitis 06/16/2021   Nonrheumatic mitral valve stenosis 01/27/2021   Atherosclerosis of aorta ([place]) 09/30/2020   DOE (dyspnea on exertion) 09/30/2020   Abnormal electrocardiogram (ECG) (EKG) 09/30/2020   Thiamine deficiency 07/04/2020   Ulcer of toe, right, with necrosis of bone ([place])    Diabetic ulcer of toe of right foot associated with type 2 diabetes mellitus, with fat layer exposed ([place]) 04/18/2019   Malignant neoplasm of urinary bladder ([place]) 07/11/2018   Chronic idiopathic constipation 06/12/2018   Diabetic infection of right foot ([place]) 07/12/2017   Foot ulcer, right, with unspecified severity ([place]) 01/17/2017   Complex sleep apnea syndrome 01/12/2016   Diabetes mellitus type 2, uncontrolled, with complications 33/29/5188   OSA (obstructive sleep apnea) 12/18/2015   Benign prostatic hyperplasia 02/18/2014   Routine general medical examination at a health care facility 02/18/2014   PAD (peripheral artery disease) ([place]) 02/18/2014   DEGENERATIVE JOINT DISEASE 02/20/2008   Diabetes mellitus type 2 with atherosclerosis of arteries of extremities ([place]) 01/26/2008   Hyperlipidemia with target LDL less than 70 01/26/2008   Morbid obesity ([place]) 01/26/2008   Mitral valve disorder 01/26/2008   GERD 01/26/2008   Irritable bowel syndrome 01/26/2008    Immunization History  Administered Date(s) Administered   Fluad Quad(high Dose 65+) 07/19/2019, 09/30/2020   Influenza Split 08/30/2011, 08/22/2012, 09/10/2015   Influenza Whole 10/24/2007, 08/26/2009   Influenza, High  Dose Seasonal PF 07/12/2017   Influenza,inj,Quad PF,6+ Mos 08/20/2013, 08/09/2014, 08/23/2016   Influenza-Unspecified 09/12/2015, 10/18/2018   PFIZER(Purple Top)SARS-COV-2 Vaccination 12/04/2019, 01/03/2020, 08/28/2020   Pneumococcal Conjugate-13 02/18/2014   Pneumococcal Polysaccharide-23 08/06/2008, 09/13/2019   Td 02/03/2009   Tdap 09/13/2019   Zoster, Live 08/09/2014    Conditions to be addressed/monitored:  Hyperlipidemia, PAD, CAD, Diabetes and Atrial Flutter   There are no care plans that you recently modified to display for this patient.   Medication Assistance:  Toujeo - Sanofi PAP approved through 11/14/21 [employer] Cares approved through 10/14/21 Xarelto - pt will not qualify for PAP this year due to failure to meet 4% OOP spend criteria (would need to spend >$2600)  Compliance/Adherence/Medication fill history: Care Gaps: Colonoscopy (due 05/10/18) Urine microalbumin (05/21/21) Foot exam (05/21/21)  Star-Rating Drugs: Simvastatin - LF 10/28/20 x 90 ds (Humana) Metformin - LF 07/30/21 x 90 ds (Humana)  Patient's preferred pharmacy is:  [facility] [phone] - [place], [place] - [address] AT [place] [address] Phone: [phone] Fax: [phone]  [place], [place] [address] Phone: [phone] Fax: [phone]  Uses pill box? No - keeps all meds in 1 container Pt endorses 90% compliance (not taking Ozempic or Xarelto)  We discussed: Current pharmacy is preferred with insurance plan and patient is satisfied with pharmacy services Patient decided to: Continue current medication management strategy  Care Plan and Follow Up Patient Decision:  Patient agrees  to Care Plan and Follow-up.  Plan: Telephone follow up appointment with care management team member scheduled for:  1 month  Charlene Brooke,  PharmD, [place], CPP Clinical Pharmacist [facility] [phone]     Current Barriers:  Unable to independently afford treatment regimen Unable to independently monitor therapeutic efficacy Unable to achieve control of diabetes  Unable to self administer medications as prescribed Does not adhere to prescribed medication regimen Does not contact provider office for questions/concerns  Pharmacist Clinical Goal(s):  Patient will verbalize ability to afford treatment regimen achieve adherence to monitoring guidelines and medication adherence to achieve therapeutic efficacy achieve control of diabetes as evidenced by improved A1c achieve ability to self administer medications as prescribed through use of insulin pens as evidenced by patient report adhere to prescribed medication regimen as evidenced by patient report contact provider office for questions/concerns as evidenced notation of same in electronic health record through collaboration with PharmD and provider.   Interventions: 1:1 collaboration with [name], MD regarding development and update of comprehensive plan of care as evidenced by provider attestation and co-signature Inter-disciplinary care team collaboration (see longitudinal plan of care) Comprehensive medication review performed; medication list updated in electronic medical record  Hyperlipidemia (LDL goal < 70) -Not ideally controlled - LDL is close to goal, but not < 70. Pt endorses compliance and denies side effects -hx PAD, aortic atherosclerosis -Current treatment: Simvastatin 40 mg daily   Aspirin 81 mg daily -Medications previously tried: none  -Patient may benefit from switching to a higher intensity statin in the future given hx of PAD, aortic atherosclerosis. Will wait for results of next lipid panel before making changes.  Diabetes (A1c goal <8%) -Uncontrolled - A1c recently worsened 8.2 > 9.3, likely d/t noncompliance  w/ Ozempic; pt is still struggling with remembering Ozempic, he reports he tries to take it on Mondays but isn't sure if he took it today -hx diabetic food infection, R toe amputations (all toes) -Current medications: Meformin 500 mg - 2 tab BID Toujeo Max 20 units daily - Sanofi PAP Ozempic 0.5 mg weekly (Sundays) Gvoke Hypopen Freestyle Libre CGM - not covered -Medications previously tried:  Anastasio Auerbach, Catering manager, Farxiga, glimepiride -Current home glucose readings fasting glucose: 81-132 post prandial glucose: 200-300 -Denies hypoglycemic symptoms -Advised to set phone alarm for Ozempic on Mondays -Informed pt that Ozempic PAP was approved and is being shipped to office -Recommend to continue current medication  Atrial Flutter (Goal: prevent stroke and major bleeding) -Dx 12/2020 from EKG. Pt is asymptomatic. -Uncontrolled - pt is not taking Xarelto because he could not afford the copay (>$250). Per cardiology, planning to transition to warfarin but unable to reach pt to schedule -hx mitral valve stenosis -CHADSVASC: 5 -Current treatment: Rate control: none Anticoagulation: Xarelto 20 mg daily PM (not taking) -Medications previously tried: none  -Advised pt to call Cardiology to schedule coumadin clinic visit  Health Maintenance -Vaccine gaps: Shingrix, covid booster -Counseled to get Shngrix, covid booster  Patient Goals/Self-Care Activities Patient will:  - take medications as prescribed -focus on medication adherence by pill box -check glucose twice a day, document, and provide at future appointments -Target a minimum of 150 minutes of moderate intensity exercise weekly -Set a phone alarm for Ozempic to repeat on Mondays -Call Cardiology to schedule Coumadin clinic visit

## 2021-09-15 ENCOUNTER — Ambulatory Visit: Payer: Medicare PPO | Admitting: Podiatry

## 2021-09-17 ENCOUNTER — Telehealth: Payer: Self-pay | Admitting: *Deleted

## 2021-09-17 ENCOUNTER — Telehealth: Payer: Medicare PPO

## 2021-09-17 ENCOUNTER — Encounter: Payer: Self-pay | Admitting: *Deleted

## 2021-09-17 NOTE — Telephone Encounter (Signed)
  Chronic Care Management   Follow Up Note   09/17/2021 Name: Jason [name] MRN: [id number] DOB: Apr 05, 1941  Referred by: [name], MD Reason for referral : Chronic Care Management (CCM RN CM Follow Up telephone visit- unsuccessful outreach)  A second unsuccessful telephone outreach was attempted today. The patient was referred to the case management team for assistance with care management and care coordination.   Follow Up Plan:  A HIPPA compliant phone message was left for the patient providing contact information and requesting a return call Will place request with scheduling care guide to contact patient to re-schedule today's missed CCM RN (second unsuccessful) telephone appointment   [name], RN, BSN, [place] [phone]: direct office [phone]: mobile

## 2021-09-21 ENCOUNTER — Telehealth: Payer: Self-pay | Admitting: *Deleted

## 2021-09-21 NOTE — Chronic Care Management (AMB) (Signed)
  Care Management   Note  09/21/2021 Name: Jason [name] MRN: [id number] DOB: 05-27-1941  Jason [name] is a 80 y.o. year old male who is a primary care patient of [name], MD and is actively engaged with the care management team. I reached out to Lucile Shutters by phone today to assist with re-scheduling a follow up visit with the RN Case Manager  Follow up plan: Unsuccessful telephone outreach attempt made. A HIPAA compliant phone message was left for the patient providing contact information and requesting a return call.  The care management team will reach out to the patient again over the next 7 days.  If patient returns call to provider office, please advise to call [place] at [phone].  [place] Management  Direct Dial: [phone]

## 2021-09-23 ENCOUNTER — Telehealth: Payer: Self-pay

## 2021-09-23 NOTE — Progress Notes (Signed)
    Chronic Care Management Pharmacy Assistant   Name: Jason [name]  MRN: [id number] DOB: Jul 14, 1941   Patient assistance application for Ozempic has been completed on behalf of the patient.  Called and left message for patient to be aware that he will need to provide requested income verification to be sent the manufacturer.   [place] Pharmacist Assistant [phone]

## 2021-09-28 NOTE — Chronic Care Management (AMB) (Signed)
  Care Management   Note  09/28/2021 Name: Jason [name] MRN: [id number] DOB: 1941/01/01  [name] is a 80 y.o. year old male who is a primary care patient of [name], MD and is actively engaged with the care management team. I reached out to Lucile Shutters by phone today to assist with re-scheduling a follow up visit with the RN Case Manager  Follow up plan: Unsuccessful telephone outreach attempt made. A HIPAA compliant phone message was left for the patient providing contact information and requesting a return call.  The care management team will reach out to the patient again over the next 7 days.  If patient returns call to provider office, please advise to call [place] at [phone].  [place] Management  Direct Dial: [phone]

## 2021-09-30 ENCOUNTER — Other Ambulatory Visit: Payer: Self-pay | Admitting: Internal Medicine

## 2021-09-30 DIAGNOSIS — E1151 Type 2 diabetes mellitus with diabetic peripheral angiopathy without gangrene: Secondary | ICD-10-CM

## 2021-10-05 NOTE — Chronic Care Management (AMB) (Signed)
  Care Management   Note  10/05/2021 Name: DALEON WILLINGER MRN: [id number] DOB: 1941/05/14  QUADRY KAMPA is a 80 y.o. year old male who is a primary care patient of [name], MD and is actively engaged with the care management team. I reached out to Lucile Shutters by phone today to assist with re-scheduling a follow up visit with the RN Case Manager  Follow up plan: Telephone appointment with care management team member scheduled for:10/14/21  [place] Management  Direct Dial: [phone]

## 2021-10-14 ENCOUNTER — Ambulatory Visit (INDEPENDENT_AMBULATORY_CARE_PROVIDER_SITE_OTHER): Payer: Medicare PPO | Admitting: *Deleted

## 2021-10-14 ENCOUNTER — Encounter: Payer: Self-pay | Admitting: *Deleted

## 2021-10-14 ENCOUNTER — Telehealth: Payer: Self-pay | Admitting: *Deleted

## 2021-10-14 DIAGNOSIS — E1151 Type 2 diabetes mellitus with diabetic peripheral angiopathy without gangrene: Secondary | ICD-10-CM

## 2021-10-14 DIAGNOSIS — Z794 Long term (current) use of insulin: Secondary | ICD-10-CM

## 2021-10-14 DIAGNOSIS — E785 Hyperlipidemia, unspecified: Secondary | ICD-10-CM

## 2021-10-14 DIAGNOSIS — I70209 Unspecified atherosclerosis of native arteries of extremities, unspecified extremity: Secondary | ICD-10-CM

## 2021-10-14 DIAGNOSIS — Z87891 Personal history of nicotine dependence: Secondary | ICD-10-CM

## 2021-10-14 NOTE — Telephone Encounter (Signed)
  Chronic Care Management   Follow Up Note   10/14/2021 Name: MAXIMO SPRATLING MRN: [id number] DOB: 1941/10/16  Referred by: [name], MD Reason for referral : Chronic Care Management (CCM RN CM Follow Up Telephone Visit- third unsuccessful attempt; case closure)  Third unsuccessful telephone outreach was attempted today. The patient was referred to the case management team for assistance with care management and care coordination. The patient's primary care provider has been notified of our unsuccessful attempts to make or maintain contact with the patient. The care management team is pleased to engage with this patient at any time in the future should he/she be interested in assistance from the care management team.   Follow Up Plan:  We have been unable to make contact with the patient for follow up. The care management team is available to follow up with the patient after provider conversation with the patient regarding recommendation for care management engagement and subsequent re-referral to the care management team.   [name], RN, BSN, [place] [phone]: direct office [phone]: mobile

## 2021-10-14 NOTE — Chronic Care Management (AMB) (Signed)
Chronic Care Management   CCM RN Visit Note  10/14/2021 Name: Jason [name] MRN: [id number] DOB: 11/11/1941  Subjective: Jason [name] is a 80 y.o. year old male who is a primary care patient of [name], MD. The care management team was consulted for assistance with disease management and care coordination needs.    CCM RN CM Case Closure  for follow up visit in response to provider referral for case management and/or care coordination services.   Consent to Services:  The patient was given information about Chronic Care Management services, agreed to services, and gave verbal consent prior to initiation of services.  Please see initial visit note for detailed documentation.  Patient agreed to services and verbal consent obtained.   Assessment: Review of patient past medical history, allergies, medications, health status, including review of consultants reports, laboratory and other test data, was performed as part of comprehensive evaluation and provision of chronic care management services.  CCM Care Plan  Allergies  Allergen Reactions   Bactrim [Sulfamethoxazole-Trimethoprim] Rash   Outpatient Encounter Medications as of 10/14/2021  Medication Sig   B-D UF III MINI PEN NEEDLES 31G X 5 MM MISC SMARTSIG:SUB-Q Morning-Night   Blood Glucose Calibration (ACCU-CHEK AVIVA) SOLN 1 Act by In Vitro route 3 (three) times daily.   blood glucose meter kit and supplies KIT Use to check blood sugar daily. DX: E11.   Blood Glucose Monitoring Suppl (ACCU-CHEK AVIVA PLUS) w/Device KIT 1 Act by Does not apply route 3 (three) times daily.   Continuous Blood Gluc Receiver (FREESTYLE LIBRE 2 READER) DEVI 1 Act by Does not apply route daily.   Continuous Blood Gluc Sensor (FREESTYLE LIBRE 2 SENSOR) MISC 1 Act by Does not apply route daily.   Glucagon (GVOKE HYPOPEN 2-PACK) 1 MG/0.2ML SOAJ Inject 1 Act into the skin daily as needed.   glucose blood (ACCU-CHEK AVIVA PLUS) test strip 1 each by  Other route 3 (three) times daily. Use as instructed   Insulin Pen Needle 32G X 6 MM MISC 1 Act by Does not apply route in the morning and at bedtime.   metFORMIN (GLUCOPHAGE) 500 MG tablet Take 2 tablets (1,000 mg total) by mouth 2 (two) times daily with a meal.   Multiple Vitamins-Minerals (MULTIVITAMIN WITH MINERALS) tablet Take 1 tablet by mouth daily.   rivaroxaban (XARELTO) 20 MG TABS tablet Take 1 tablet (20 mg total) by mouth daily with supper.   Semaglutide,0.25 or 0.5MG/DOS, (OZEMPIC, 0.25 OR 0.5 MG/DOSE,) 2 MG/1.5ML SOPN Inject 1 mg into the skin once a week. (Patient taking differently: Inject 1 mg into the skin every Monday.)   simvastatin (ZOCOR) 40 MG tablet Take 1 tablet (40 mg total) by mouth daily at 6 PM.   TOUJEO MAX SOLOSTAR 300 UNIT/ML Solostar Pen INJECT 30 UNITS INTO THE SKIN DAILY.   triamcinolone cream (KENALOG) 0.1 % Apply 1 application topically 2 (two) times daily.   TRUEplus Safety Lancets 28G MISC 1 Act by Does not apply route in the morning and at bedtime.   Wound Dressings (MEDIHONEY WOUND/BURN DRESSING) GEL Apply to affected are 3 times a week, and cover with sterile dressing.   No facility-administered encounter medications on file as of 10/14/2021.   Patient Active Problem List   Diagnosis Date Noted   Memory difficulties 08/18/2021   Depression 08/18/2021   Atrial flutter ([place]) 08/02/2021   Dental infection 06/16/2021   Contact dermatitis 06/16/2021   Nonrheumatic mitral valve stenosis 01/27/2021   Atherosclerosis of  aorta ([place]) 09/30/2020   DOE (dyspnea on exertion) 09/30/2020   Abnormal electrocardiogram (ECG) (EKG) 09/30/2020   Thiamine deficiency 07/04/2020   Ulcer of toe, right, with necrosis of bone ([place])    Diabetic ulcer of toe of right foot associated with type 2 diabetes mellitus, with fat layer exposed ([place]) 04/18/2019   Malignant neoplasm of urinary bladder ([place]) 07/11/2018   Chronic idiopathic constipation 06/12/2018   Diabetic infection  of right foot ([place]) 07/12/2017   Foot ulcer, right, with unspecified severity ([place]) 01/17/2017   Complex sleep apnea syndrome 01/12/2016   Diabetes mellitus type 2, uncontrolled, with complications 53/74/8270   OSA (obstructive sleep apnea) 12/18/2015   Benign prostatic hyperplasia 02/18/2014   Routine general medical examination at a health care facility 02/18/2014   PAD (peripheral artery disease) ([place]) 02/18/2014   DEGENERATIVE JOINT DISEASE 02/20/2008   Diabetes mellitus type 2 with atherosclerosis of arteries of extremities ([place]) 01/26/2008   Hyperlipidemia with target LDL less than 70 01/26/2008   Morbid obesity ([place]) 01/26/2008   Mitral valve disorder 01/26/2008   GERD 01/26/2008   Irritable bowel syndrome 01/26/2008   Conditions to be addressed/monitored:  HLD and DMII  Care Plan : Diabetes Type 2 (Adult)  Updates made by Knox Royalty, RN since 10/14/2021 12:00 AM     Problem: Disease Progression (Diabetes, Type 2)   Priority: Medium     Long-Range Goal: Disease Progression Prevented or Minimized   Start Date: 05/13/2021  Expected End Date: 05/13/2022  Recent Progress: Not on track  Priority: Medium  Note:   Objective:  Lab Results  Component Value Date   HGBA1C 9.3 (H) 05/07/2021   Lab Results  Component Value Date   CREATININE 1.21 05/07/2021   CREATININE 0.95 01/06/2021   CREATININE 1.11 09/30/2020   No results found for: EGFR Current Barriers:  Knowledge Deficits related to basic Diabetes pathophysiology and self care/management Knowledge Deficits related to medications used for management of diabetes- patient reports he often forgets to take his medications/ insulin: CCM Pharmacy team active Financial Constraints- reports could use assistance/ resources for managing utility bills: 05/27/21- confirms has spoken to [employer] and has been provided resources Unable to independently verbalize self-health management strategies for DM: diet,  medications, routine home blood sugar monitoring-- will require ongoing reinforcement Does not adhere to provider recommendations re: blood sugar monitoring at home; medications- often forgets to take: Painter team involved High Fall Risk: reports 6-plus falls over last 12 months, all without injury Case Manager Clinical Goal(s): 07/17/21: not on track 05/13/21: Over the next 12 months, patient will demonstrate ongoing adherence to/ improvement in prescribed treatment plan for diabetes self care/management as evidenced by:  Daily monitoring and recording of CBG twice daily  Improved adherence to ADA/ carb modified diet  Adherence to prescribed medication regimen  Contacting care providers for new or worsened symptoms, concerns or questions Interventions:  Collaboration with [name], MD regarding development and update of comprehensive plan of care as evidenced by provider attestation and co-signature Inter-disciplinary care team collaboration (see longitudinal plan of care) Review of patient status, including review of consultants reports, relevant laboratory and other test results, and medications completed  10/14/21- third consecutive unsuccessful outreach attempt without patient call-back; case closure accordingly; will make CCM team aware of RN CM sign-off/ case closure         Plan: I have been unable to make contact with the patient for follow up. The care management team is  available to follow up with the patient after provider conversation with the patient regarding recommendation for care management engagement and subsequent re-referral to the care management team  [name], RN, BSN, [place] [phone]: direct office [phone]: mobile

## 2021-10-16 ENCOUNTER — Other Ambulatory Visit: Payer: Self-pay

## 2021-10-16 ENCOUNTER — Ambulatory Visit: Payer: Medicare PPO | Admitting: Podiatry

## 2021-10-16 DIAGNOSIS — E11621 Type 2 diabetes mellitus with foot ulcer: Secondary | ICD-10-CM | POA: Diagnosis not present

## 2021-10-16 DIAGNOSIS — L97411 Non-pressure chronic ulcer of right heel and midfoot limited to breakdown of skin: Secondary | ICD-10-CM | POA: Diagnosis not present

## 2021-10-16 MED ORDER — SILVER SULFADIAZINE 1 % EX CREA: TOPICAL_CREAM | CUTANEOUS | 0 refills | Status: DC

## 2021-10-16 NOTE — Progress Notes (Signed)
  Subjective:  Patient ID: Jason [name], male    DOB: 1941/06/25,  MRN: [id number]  Chief Complaint  Patient presents with   Foot Problem     large blister or corn diabetic   80 y.o. male presents for wound care. Hx confirmed with patient. Wound appears to have recurred - patient unsure of when. States it has drained. Denies pain. Denies other issues. Objective:  Physical Exam: Wound Location: right submet 1 Wound Measurement: 1*1.5 Wound Base: almost wholly granular mild fibrotic rim approx 80 red granulation tissue Peri-wound: Calloused, macerated Exudate: mild amount of serous drainage wound without warmth, erythema, signs of acute infection Assessment:   1. Diabetic ulcer of right midfoot associated with type 2 diabetes mellitus, limited to breakdown of skin ([place])    Plan:  Patient was evaluated and treated and all questions answered.  Ulcer righ 1st met with blister -Wound thoroughly cleansed and debrided -Wound has re-ulcerated. Unsure duration since patient states he does not look at his foot like he should -Dressed with silvadene and antibiotic dressing.  -Offload with surgical shoe. -Will discuss ordering vascular studies - last time 1 year ago.  Procedure: Excisional Debridement of Wound Indication: Removal of non-viable soft tissue from the wound to promote healing.  Anesthesia: none Pre-Debridement Wound Measurements: 1 cm x 1 cm x 0.3 cm  Post-Debridement Wound Measurements: 1 cm x 1.5 cm x 0.3 cm  Type of Debridement: Sharp Excisional Tissue Removed: Non-viable soft tissue Instrumentation: 15 blade and tissue nipper Depth of Debridement: subcutaneous tissue. Technique: Sharp excisional debridement to bleeding, viable wound base.  Dressing: Dry, sterile, compression dressing. Disposition: Patient tolerated procedure well.  Return in about 3 weeks (around 11/06/2021).

## 2021-11-06 ENCOUNTER — Ambulatory Visit: Payer: Medicare PPO | Admitting: Podiatry

## 2021-11-10 ENCOUNTER — Telehealth: Payer: Self-pay | Admitting: Internal Medicine

## 2021-11-10 ENCOUNTER — Other Ambulatory Visit: Payer: Self-pay | Admitting: Internal Medicine

## 2021-11-10 DIAGNOSIS — Z794 Long term (current) use of insulin: Secondary | ICD-10-CM

## 2021-11-10 DIAGNOSIS — E1151 Type 2 diabetes mellitus with diabetic peripheral angiopathy without gangrene: Secondary | ICD-10-CM

## 2021-11-10 MED ORDER — GVOKE HYPOPEN 2-PACK 1 MG/0.2ML ~~LOC~~ SOAJ
1.0000 | Freq: Every day | SUBCUTANEOUS | 5 refills | Status: DC | PRN
Start: 2021-11-10 — End: 2023-08-25

## 2021-11-10 MED ORDER — TOUJEO MAX SOLOSTAR 300 UNIT/ML ~~LOC~~ SOPN: 30.0000 [IU] | PEN_INJECTOR | Freq: Every day | SUBCUTANEOUS | 0 refills | Status: DC

## 2021-11-10 MED ORDER — INSULIN PEN NEEDLE 32G X 6 MM MISC: 1.0000 | Freq: Two times a day (BID) | 3 refills | Status: DC

## 2021-11-10 NOTE — Telephone Encounter (Signed)
Pt connected to [facility] 12.26.2022.    Caller states he is completely out of insulin and needles, is very thirsty and has headache today. Is a diabetic type 2. Does not have way to take blood sugar.   Advised to go to ED.   Please refill medication.

## 2021-11-10 NOTE — Telephone Encounter (Signed)
1.Medication Requested: Glucagon (GVOKE HYPOPEN 2-PACK) 1 MG/0.2ML SOAJ  2. Pharmacy (Name, [place], [facility]): [place], [place]  3. On Med List: yes   4. Last Visit with PCP: 08-18-2021 ([place])  5. Next visit date with PCP: 11-19-2021  Patient requesting insulin needles, patient does not know which insulin needles is needed  Patient requesting 90 day supply of Glucagon (GVOKE HYPOPEN 2-PACK) 1 MG/0.2ML SOAJ

## 2021-11-19 ENCOUNTER — Ambulatory Visit: Payer: Medicare PPO | Admitting: Internal Medicine

## 2021-11-23 NOTE — Progress Notes (Deleted)
Cardiology Office Note   Date:  11/23/2021   ID:  Jason [name], DOB May 12, 1941, MRN [id number]  PCP:  [name], MD  Cardiologist:   Minus Breeding, MD   No chief complaint on file.      History of Present Illness: Jason [name] is a 81 y.o. male who I saw in 2017.  He had MVP and moderate MR and MS. at the last visit he was noted to have an EKG consistent with flutter waves.  I had him wear a monitor and he does he had flutter 100% of the time but with controlled rate.   I repeated an echo in Sept.  He had severe MS and severe MR.  The EF was 65%.  I tried to get in touch with him several times and eventually the staff was able to schedule him for a follow up appt.   ***  ***   last year.  The MS appears to be mod/severe as it was before with no change in mean gradient although the MV area by PHT is reduced.    He has done well.  He has a limited understanding of his valvular issues and I did show him videos animation's today.  He walks to the mailbox.  He is more limited by just overall fatigue and is lost some toes on his right foot.  He does do some activities like he cut some shrubs this weekend. The patient denies any new symptoms such as chest discomfort, neck or arm discomfort. There has been no new shortness of breath, PND or orthopnea. There have been no reported palpitations, presyncope or syncope.   Past Medical History:  Diagnosis Date   Anxiety    Asthma    in past, no inhaler now   Benign neoplasm of colon    Bone infection ([place])    Cancer ([place])    bladder cancer   COPD (chronic obstructive pulmonary disease) (HCC)    Depression    Diverticulosis of colon (without mention of hemorrhage)    DJD (degenerative joint disease)    Esophageal reflux    Glaucoma    Hidradenitis    History of BPH    History of gynecomastia    Unilateral   Hyperlipidemia    Irritable bowel syndrome    Memory loss    Mitral stenosis    mild-moderate MS 01/2016   MVP  (mitral valve prolapse)    Obesity, unspecified    Pneumonia    Sleep apnea    does not use  cpap or bipap .. no study  ever   Type II or unspecified type diabetes mellitus without mention of complication, not stated as uncontrolled    Unspecified venous (peripheral) insufficiency     Past Surgical History:  Procedure Laterality Date   ACHILLES TENDON SURGERY Right 05/17/2019   Procedure: ACHILLES LENGTHENING/KIDNER;  Surgeon: Evelina Bucy, DPM;  Location: [place];  Service: Podiatry;  Laterality: Right;   AMPUTATION  08/09/2012   Procedure: AMPUTATION DIGIT;  Surgeon: Newt Minion, MD;  Location: [place];  Service: Orthopedics;  Laterality: Right;  Amputation right Great Toe MTP Joint   AMPUTATION  10/17/2012   Procedure: AMPUTATION DIGIT;  Surgeon: Newt Minion, MD;  Location: [place];  Service: Orthopedics;  Laterality: Right;  Right Foot 3rd Toe Amputation at Metatarsophalangeal Joint   AMPUTATION TOE Right 05/17/2019   Procedure: AMPUTATION TOE Metatarsal Phalangeal Joint   FOURTH AND FIFTH, FILLETED  TOE FLAP;  Surgeon: Evelina Bucy, DPM;  Location: [place];  Service: Podiatry;  Laterality: Right;   BUBBLE STUDY  08/12/2021   Procedure: BUBBLE STUDY;  Surgeon: Geralynn Rile, MD;  Location: [place];  Service: Cardiovascular;;   CHOLECYSTECTOMY N/A 06/14/2018   Procedure: LAPAROSCOPIC CHOLECYSTECTOMY WITH INTRAOPERATIVE CHOLANGIOGRAM;  Surgeon: Mickeal Skinner, MD;  Location: [facility];  Service: General;  Laterality: N/A;   CIRCUMCISION  06/2100   For Phimosis - Dr Hartley Barefoot   COLON SURGERY     CYSTOSCOPY W/ RETROGRADES Bilateral 07/07/2018   Procedure: CYSTOSCOPY WITH BILATERAL RETROGRADE PYELOGRAM;  Surgeon: Ardis Hughs, MD;  Location: [facility];  Service: Urology;  Laterality: Bilateral;   KNEE ARTHROSCOPY     LAPAROSCOPIC LYSIS OF ADHESIONS  06/14/2018   Procedure: LAPAROSCOPIC LYSIS OF ADHESIONS;  Surgeon: Kieth Brightly Arta Bruce, MD;  Location: [facility];  Service:  General;;   LAPAROTOMY  07/20/11   LASER ABLATION  06/2009   Left Greater Saphenous vein -Dr Elicia Lamp OSTEOTOMY Right 05/17/2019   Procedure: METATARSLSECTOMY FOURTH DIGIT;  Surgeon: Evelina Bucy, DPM;  Location: [place];  Service: Podiatry;  Laterality: Right;   MULTIPLE TOOTH EXTRACTIONS     TEE WITHOUT CARDIOVERSION N/A 08/12/2021   Procedure: TRANSESOPHAGEAL ECHOCARDIOGRAM (TEE);  Surgeon: Geralynn Rile, MD;  Location: [place];  Service: Cardiovascular;  Laterality: N/A;   TOE AMPUTATION  06/2009   x3Right foot second toe -Dr Beola Cord   TONSILLECTOMY     TRANSURETHRAL RESECTION OF BLADDER TUMOR N/A 07/07/2018   Procedure: TRANSURETHRAL RESECTION OF BLADDER TUMOR (TURBT) WITH POST OP INSTILLATION OF GEMCITABIN;  Surgeon: Ardis Hughs, MD;  Location: [facility];  Service: Urology;  Laterality: N/A;   WOUND DEBRIDEMENT Right 07/25/2019   Procedure: Right foot wound debridement with application of skin graft substitute;  Surgeon: Evelina Bucy, DPM;  Location: [facility];  Service: Podiatry;  Laterality: Right;     Current Outpatient Medications  Medication Sig Dispense Refill   B-D UF III MINI PEN NEEDLES 31G X 5 MM MISC SMARTSIG:SUB-Q Morning-Night     Blood Glucose Calibration (ACCU-CHEK AVIVA) SOLN 1 Act by In Vitro route 3 (three) times daily. 1 each 2   blood glucose meter kit and supplies KIT Use to check blood sugar daily. DX: E11. 1 each 0   Blood Glucose Monitoring Suppl (ACCU-CHEK AVIVA PLUS) w/Device KIT 1 Act by Does not apply route 3 (three) times daily. 1 kit 2   Continuous Blood Gluc Receiver (FREESTYLE LIBRE 2 READER) DEVI 1 Act by Does not apply route daily. 2 each 5   Continuous Blood Gluc Sensor (FREESTYLE LIBRE 2 SENSOR) MISC 1 Act by Does not apply route daily. 2 each 5   Glucagon (GVOKE HYPOPEN 2-PACK) 1 MG/0.2ML SOAJ Inject 1 Act into the skin daily as needed. 2 mL 5   glucose blood (ACCU-CHEK AVIVA PLUS) test strip 1 each by Other route 3 (three)  times daily. Use as instructed 300 each 2   insulin glargine, 2 Unit Dial, (TOUJEO MAX SOLOSTAR) 300 UNIT/ML Solostar Pen Inject 30 Units into the skin daily. 12 mL 0   Insulin Pen Needle 32G X 6 MM MISC 1 Act by Does not apply route in the morning and at bedtime. 100 each 3   metFORMIN (GLUCOPHAGE) 500 MG tablet Take 2 tablets (1,000 mg total) by mouth 2 (two) times daily with a meal. 360 tablet 0   Multiple Vitamins-Minerals (MULTIVITAMIN WITH MINERALS) tablet  Take 1 tablet by mouth daily.     rivaroxaban (XARELTO) 20 MG TABS tablet Take 1 tablet (20 mg total) by mouth daily with supper. 90 tablet 3   Semaglutide,0.25 or 0.5MG/DOS, (OZEMPIC, 0.25 OR 0.5 MG/DOSE,) 2 MG/1.5ML SOPN Inject 1 mg into the skin once a week. (Patient taking differently: Inject 1 mg into the skin every Monday.) 9 mL 1   silver sulfADIAZINE (SILVADENE) 1 % cream Apply pea-sized amount to wound daily. 50 g 0   simvastatin (ZOCOR) 40 MG tablet Take 1 tablet (40 mg total) by mouth daily at 6 PM. 90 tablet 1   triamcinolone cream (KENALOG) 0.1 % Apply 1 application topically 2 (two) times daily. 30 g 0   TRUEplus Safety Lancets 28G MISC 1 Act by Does not apply route in the morning and at bedtime. 200 each 3   Wound Dressings (MEDIHONEY WOUND/BURN DRESSING) GEL Apply to affected are 3 times a week, and cover with sterile dressing. 15 mL 2   No current facility-administered medications for this visit.    Allergies:   Bactrim [sulfamethoxazole-trimethoprim]    ROS:  Please see the history of present illness.   Otherwise, review of systems are positive for ***.   All other systems are reviewed and negative.    PHYSICAL EXAM: VS:  There were no vitals taken for this visit. , BMI There is no height or weight on file to calculate BMI. GENERAL:  Well appearing NECK:  No jugular venous distention, waveform within normal limits, carotid upstroke brisk and symmetric, no bruits, no thyromegaly LUNGS:  Clear to auscultation  bilaterally CHEST:  Unremarkable HEART:  PMI not displaced or sustained,S1 and S2 within normal limits, no S3, no S4, no clicks, no rubs, *** murmurs ABD:  Flat, positive bowel sounds normal in frequency in pitch, no bruits, no rebound, no guarding, no midline pulsatile mass, no hepatomegaly, no splenomegaly EXT:  2 plus pulses throughout, no edema, no cyanosis no clubbing    ***GENERAL:  Well appearing NECK:  No jugular venous distention, waveform within normal limits, carotid upstroke brisk and symmetric, no bruits, no thyromegaly LUNGS:  Clear to auscultation bilaterally CHEST:  Unremarkable HEART:  PMI not displaced or sustained,S1 and S2 within normal limits, no S3, no S4, no clicks, no rubs, 2 out of 6 systolic murmur heard at the apex and nonradiating, no diastolic murmurs ABD:  Flat, positive bowel sounds normal in frequency in pitch, no bruits, no rebound, no guarding, no midline pulsatile mass, no hepatomegaly, no splenomegaly EXT:  2 plus pulses throughout, no edema, no cyanosis no clubbing    EKG:  EKG is *** ordered today. Normal sinus rhythm, rate ***, first-degree AV block, early transition lead V2, no acute ST-T wave changes.    Recent Labs: 05/07/2021: TSH 0.78 08/03/2021: BUN 10; Creatinine, Ser 0.98; Hemoglobin 12.1; Platelets 176; Potassium 4.7; Sodium 140    Lipid Panel    Component Value Date/Time   CHOL 147 05/21/2020 1458   TRIG 100 05/21/2020 1458   HDL 49 05/21/2020 1458   CHOLHDL 3.0 05/21/2020 1458   VLDL 16.0 04/18/2019 1035   LDLCALC 79 05/21/2020 1458      Wt Readings from Last 3 Encounters:  08/18/21 289 lb (131.1 kg)  08/12/21 296 lb (134.3 kg)  08/03/21 293 lb 9.6 oz (133.2 kg)      Other studies Reviewed: Additional studies/ records that were reviewed today include: ***. Review of the above records demonstrates:  ***   ASSESSMENT  AND PLAN:  Mitrial vavle disease:    ***  Mod to severe MS in Sept of last year.  I am going to check a  TEE.  I discussed to the patient in detail.  He has no contraindications.  Atrial flutter:   ***  He is in sinus rhythm.  He has had fib flutter noticed previously on a monitor.  He was previously on Xarelto but we called the pharmacy today and he has not released pick this up since March.  He had no contraindications.   Hyperlipidemia: His LDL was ***  79 with an HDL of 49.  Continue to the current therapy.    DM2:   A1c was *** up to 9.3 from 8.2.   He has follow-up with Dr. Ronnald Ramp.   Current medicines are reviewed at length with the patient today.  The patient does not have concerns regarding medicines.  The following changes have been made:  ***  Labs/ tests ordered today include:  ***  No orders of the defined types were placed in this encounter.   Disposition:   FU with me in ***   Signed, Minus Breeding, MD  11/23/2021 8:24 PM    [place]

## 2021-11-25 ENCOUNTER — Ambulatory Visit: Payer: Medicare PPO | Admitting: Cardiology

## 2021-11-25 ENCOUNTER — Encounter: Payer: Self-pay | Admitting: Cardiology

## 2021-11-25 DIAGNOSIS — E785 Hyperlipidemia, unspecified: Secondary | ICD-10-CM

## 2021-11-25 DIAGNOSIS — I059 Rheumatic mitral valve disease, unspecified: Secondary | ICD-10-CM

## 2021-11-25 DIAGNOSIS — E1151 Type 2 diabetes mellitus with diabetic peripheral angiopathy without gangrene: Secondary | ICD-10-CM

## 2021-11-27 ENCOUNTER — Telehealth: Payer: Self-pay

## 2021-11-27 NOTE — Progress Notes (Signed)
Chronic Care Management Pharmacy Assistant   Name: Jason [name]  MRN: [id number] DOB: 12-May-1941   Reason for Encounter: Disease State  Appt: 01/27/22 @ 11 am with Linna Hoff  Conditions to be addressed/monitored: DMII   Recent office visits:  08/18/21 Binnie Rail, MD-PCP (Diabetic Mellitus) Labs, and referral to neurology and psychology. Med changes: none  Recent consult visits:  10/16/21 Evelina Bucy, DPM-Podiatry (Diabetic ulcer of right foot) No orders, med changes: silver sulfadiazine 1%  Hospital visits:  Medication Reconciliation was completed by comparing discharge summary, patients EMR and Pharmacy list, and upon discussion with patient.  Admitted to the hospital on 08/12/21 due to TEE. Discharge date was 08/12/21. Discharged from [facility].    Medications that remain the same after Hospital Discharge:??  -All other medications will remain the same.    Medications: Outpatient Encounter Medications as of 11/27/2021  Medication Sig   B-D UF III MINI PEN NEEDLES 31G X 5 MM MISC SMARTSIG:SUB-Q Morning-Night   Blood Glucose Calibration (ACCU-CHEK AVIVA) SOLN 1 Act by In Vitro route 3 (three) times daily.   blood glucose meter kit and supplies KIT Use to check blood sugar daily. DX: E11.   Blood Glucose Monitoring Suppl (ACCU-CHEK AVIVA PLUS) w/Device KIT 1 Act by Does not apply route 3 (three) times daily.   Continuous Blood Gluc Receiver (FREESTYLE LIBRE 2 READER) DEVI 1 Act by Does not apply route daily.   Continuous Blood Gluc Sensor (FREESTYLE LIBRE 2 SENSOR) MISC 1 Act by Does not apply route daily.   Glucagon (GVOKE HYPOPEN 2-PACK) 1 MG/0.2ML SOAJ Inject 1 Act into the skin daily as needed.   glucose blood (ACCU-CHEK AVIVA PLUS) test strip 1 each by Other route 3 (three) times daily. Use as instructed   insulin glargine, 2 Unit Dial, (TOUJEO MAX SOLOSTAR) 300 UNIT/ML Solostar Pen Inject 30 Units into the skin daily.   Insulin Pen Needle 32G X 6 MM MISC 1  Act by Does not apply route in the morning and at bedtime.   metFORMIN (GLUCOPHAGE) 500 MG tablet Take 2 tablets (1,000 mg total) by mouth 2 (two) times daily with a meal.   Multiple Vitamins-Minerals (MULTIVITAMIN WITH MINERALS) tablet Take 1 tablet by mouth daily.   rivaroxaban (XARELTO) 20 MG TABS tablet Take 1 tablet (20 mg total) by mouth daily with supper.   Semaglutide,0.25 or 0.5MG/DOS, (OZEMPIC, 0.25 OR 0.5 MG/DOSE,) 2 MG/1.5ML SOPN Inject 1 mg into the skin once a week. (Patient taking differently: Inject 1 mg into the skin every Monday.)   silver sulfADIAZINE (SILVADENE) 1 % cream Apply pea-sized amount to wound daily.   simvastatin (ZOCOR) 40 MG tablet Take 1 tablet (40 mg total) by mouth daily at 6 PM.   triamcinolone cream (KENALOG) 0.1 % Apply 1 application topically 2 (two) times daily.   TRUEplus Safety Lancets 28G MISC 1 Act by Does not apply route in the morning and at bedtime.   Wound Dressings (MEDIHONEY WOUND/BURN DRESSING) GEL Apply to affected are 3 times a week, and cover with sterile dressing.   No facility-administered encounter medications on file as of 11/27/2021.   Recent Relevant Labs: Lab Results  Component Value Date/Time   HGBA1C 7.5 (A) 08/18/2021 02:40 PM   HGBA1C 7.5 08/18/2021 02:40 PM   HGBA1C 7.5 (A) 08/18/2021 02:40 PM   HGBA1C 7.5 (A) 08/18/2021 02:40 PM   HGBA1C 9.3 (H) 05/07/2021 01:54 PM   HGBA1C 8.2 (H) 01/06/2021 11:51 AM   MICROALBUR  2.1 05/21/2020 02:58 PM   MICROALBUR 11.3 (H) 04/18/2019 10:43 AM    Kidney Function Lab Results  Component Value Date/Time   CREATININE 0.98 08/03/2021 10:42 AM   CREATININE 1.21 05/07/2021 01:54 PM   CREATININE 1.00 05/21/2020 02:58 PM   GFR 57.09 (L) 05/07/2021 01:54 PM   GFRNONAA >60 05/17/2019 06:29 AM   GFRAA >60 05/17/2019 06:29 AM   Reviewed chart for medication changes and drug therapy problems ahead of medication adherence call.  Attempted to contact patient x 3 for medication review and health  check, unable to reach patient, left voicemails to return call.    Current antihyperglycemic regimen:  Metformin 500 mg Toujeo 30 units daily ozempic 0.21m/dos  Adherence Review: Is the patient currently on a STATIN medication? Yes Is the patient currently on ACE/ARB medication? No Does the patient have >5 day gap between last estimated fill dates? Yes   Care Gaps: Colonoscopy-06/10/15 Diabetic Foot Exam-05/21/20 Ophthalmology-04/29/21 Dexa Scan - NA Annual Well Visit - NA Micro albumin-05/21/20 Hemoglobin A1c- 08/18/21  Star Rating Drugs: Metformin 500 mg-last fill 07/30/21 90 ds Simvastatin 40 mg-last fill   TMill CreekPharmacist Assistant [phone]

## 2021-12-04 ENCOUNTER — Ambulatory Visit: Payer: Medicare PPO | Admitting: Podiatry

## 2021-12-04 ENCOUNTER — Other Ambulatory Visit: Payer: Self-pay

## 2021-12-04 DIAGNOSIS — L97411 Non-pressure chronic ulcer of right heel and midfoot limited to breakdown of skin: Secondary | ICD-10-CM

## 2021-12-04 DIAGNOSIS — E11621 Type 2 diabetes mellitus with foot ulcer: Secondary | ICD-10-CM

## 2021-12-04 DIAGNOSIS — L84 Corns and callosities: Secondary | ICD-10-CM

## 2021-12-04 DIAGNOSIS — I739 Peripheral vascular disease, unspecified: Secondary | ICD-10-CM | POA: Diagnosis not present

## 2021-12-04 MED ORDER — SILVER SULFADIAZINE 1 % EX CREA: TOPICAL_CREAM | CUTANEOUS | 0 refills | Status: DC

## 2021-12-04 NOTE — Progress Notes (Signed)
Subjective:  Patient ID: Jason [name], male    DOB: Dec 30, 1940,  MRN: [id number]  Chief Complaint  Patient presents with   Wound Check    Follow up wound check   81 y.o. male presents for wound care. Hx confirmed with patient. Wound looking better per patient. Reports minor bleeding but no pain. Objective:  Physical Exam: Wound Location: right submet 1 Wound Measurement: 1*1.5 Wound Base: almost wholly granular mild fibrotic rim approx 80 red granulation tissue Peri-wound: Calloused, macerated Exudate: mild amount of serous drainage wound without warmth, erythema, signs of acute infection Assessment:   1. Diabetic ulcer of right midfoot associated with type 2 diabetes mellitus, limited to breakdown of skin ([place])     Plan:  Patient was evaluated and treated and all questions answered.  Ulcer righ 1st met with blister -Wound thoroughly cleansed and debrided -Wound appears about hte same. Not wearing his surgical shoe -Will make appt for fabrication of DM insert/toe filller right -Offload with surgical shoe.  PAD -Ordered repeat vascular studies.  Procedure: Excisional Debridement of Wound Indication: Removal of non-viable soft tissue from the wound to promote healing.  Anesthesia: none Pre-Debridement Wound Measurements: 1 cm x 1 cm x 0.2 cm  Post-Debridement Wound Measurements: 1 cm x 1.5 cm x 0.2 cm  Type of Debridement: Sharp Excisional Tissue Removed: Non-viable soft tissue Instrumentation: 15 blade and tissue nipper Depth of Debridement: subcutaneous tissue. Technique: Sharp excisional debridement to bleeding, viable wound base.  Dressing: Dry, sterile, compression dressing. Disposition: Patient tolerated procedure well.  Return in about 3 weeks (around 12/25/2021) for Wound Care.

## 2021-12-07 ENCOUNTER — Ambulatory Visit: Payer: Medicare PPO | Admitting: Physician Assistant

## 2021-12-11 ENCOUNTER — Other Ambulatory Visit: Payer: Self-pay

## 2021-12-11 ENCOUNTER — Ambulatory Visit: Payer: Medicare PPO

## 2021-12-18 ENCOUNTER — Telehealth: Payer: Self-pay

## 2021-12-18 NOTE — Progress Notes (Signed)
Chronic Care Management Pharmacy Assistant   Name: Jason [name]  MRN: [id number] DOB: 05-01-1941   Reason for Encounter: Disease State  Appt: 01/27/22 @ 11 am with Linna Hoff  Conditions to be addressed/monitored: DMII   Recent office visits:  08/18/21 Binnie Rail, MD-PCP (Diabetic Mellitus) Labs, and referral to neurology and psychology. Med changes: none  Recent consult visits:  12/04/21 Evelina Bucy, DPM-Podiatry (Diabetic Ulcer of right midfoot) Orders: Vas [place] ABI with/wo TBI, no med changes  10/16/21 Evelina Bucy, DPM-Podiatry (Diabetic ulcer of right foot) No orders, med changes: silver sulfadiazine 1%  Hospital visits:  Medication Reconciliation was completed by comparing discharge summary, patients EMR and Pharmacy list, and upon discussion with patient.   Admitted to the hospital on 08/12/21 due to TEE. Discharge date was 08/12/21. Discharged from [facility].     Medications that remain the same after Hospital Discharge:??  -All other medications will remain the same.    Medications: Outpatient Encounter Medications as of 12/18/2021  Medication Sig   B-D UF III MINI PEN NEEDLES 31G X 5 MM MISC SMARTSIG:SUB-Q Morning-Night   Blood Glucose Calibration (ACCU-CHEK AVIVA) SOLN 1 Act by In Vitro route 3 (three) times daily.   blood glucose meter kit and supplies KIT Use to check blood sugar daily. DX: E11.   Blood Glucose Monitoring Suppl (ACCU-CHEK AVIVA PLUS) w/Device KIT 1 Act by Does not apply route 3 (three) times daily.   Continuous Blood Gluc Receiver (FREESTYLE LIBRE 2 READER) DEVI 1 Act by Does not apply route daily.   Continuous Blood Gluc Sensor (FREESTYLE LIBRE 2 SENSOR) MISC 1 Act by Does not apply route daily.   Glucagon (GVOKE HYPOPEN 2-PACK) 1 MG/0.2ML SOAJ Inject 1 Act into the skin daily as needed.   glucose blood (ACCU-CHEK AVIVA PLUS) test strip 1 each by Other route 3 (three) times daily. Use as instructed   insulin glargine, 2 Unit Dial,  (TOUJEO MAX SOLOSTAR) 300 UNIT/ML Solostar Pen Inject 30 Units into the skin daily.   Insulin Pen Needle 32G X 6 MM MISC 1 Act by Does not apply route in the morning and at bedtime.   metFORMIN (GLUCOPHAGE) 500 MG tablet Take 2 tablets (1,000 mg total) by mouth 2 (two) times daily with a meal.   Multiple Vitamins-Minerals (MULTIVITAMIN WITH MINERALS) tablet Take 1 tablet by mouth daily.   rivaroxaban (XARELTO) 20 MG TABS tablet Take 1 tablet (20 mg total) by mouth daily with supper.   Semaglutide,0.25 or 0.5MG/DOS, (OZEMPIC, 0.25 OR 0.5 MG/DOSE,) 2 MG/1.5ML SOPN Inject 1 mg into the skin once a week. (Patient taking differently: Inject 1 mg into the skin every Monday.)   silver sulfADIAZINE (SILVADENE) 1 % cream Apply pea-sized amount to wound daily.   simvastatin (ZOCOR) 40 MG tablet Take 1 tablet (40 mg total) by mouth daily at 6 PM.   triamcinolone cream (KENALOG) 0.1 % Apply 1 application topically 2 (two) times daily.   TRUEplus Safety Lancets 28G MISC 1 Act by Does not apply route in the morning and at bedtime.   Wound Dressings (MEDIHONEY WOUND/BURN DRESSING) GEL Apply to affected are 3 times a week, and cover with sterile dressing.   No facility-administered encounter medications on file as of 12/18/2021.   Recent Relevant Labs: Lab Results  Component Value Date/Time   HGBA1C 7.5 (A) 08/18/2021 02:40 PM   HGBA1C 7.5 08/18/2021 02:40 PM   HGBA1C 7.5 (A) 08/18/2021 02:40 PM   HGBA1C 7.5 (A)  08/18/2021 02:40 PM   HGBA1C 9.3 (H) 05/07/2021 01:54 PM   HGBA1C 8.2 (H) 01/06/2021 11:51 AM   MICROALBUR 2.1 05/21/2020 02:58 PM   MICROALBUR 11.3 (H) 04/18/2019 10:43 AM    Kidney Function Lab Results  Component Value Date/Time   CREATININE 0.98 08/03/2021 10:42 AM   CREATININE 1.21 05/07/2021 01:54 PM   CREATININE 1.00 05/21/2020 02:58 PM   GFR 57.09 (L) 05/07/2021 01:54 PM   GFRNONAA >60 05/17/2019 06:29 AM   GFRAA >60 05/17/2019 06:29 AM    Current antihyperglycemic regimen:   Metformin 500 mg (Patient not taking everyday) Toujeo 30 units daily ozempic 0.33m/dos  What recent interventions/DTPs have been made to improve glycemic control:  None noted  Have there been any recent hospitalizations or ED visits since last visit with CPP? Yes  Patient denies hypoglycemic symptoms, including None  Patient reports hyperglycemic symptoms, including weakness, and fatigued all the time  How often are you checking your blood sugar?Patient stated that he is not taking his blood sugar and also has been missing doses of his medications. States that he takes metformin sometimes but not other medications just confused.  What are your blood sugars ranging? Patient does not know what his blood sugars range between  During the week, how often does your blood glucose drop below 70?  NA  Are you checking your feet daily/regularly? Patient did say that he is having some swelling in his and a sore that seems to be giving him an issue  Adherence Review: Is the patient currently on a STATIN medication? Yes Is the patient currently on ACE/ARB medication? No Does the patient have >5 day gap between last estimated fill dates? Yes  Care Gaps: Colonoscopy-06/10/15 Diabetic Foot Exam-05/21/20 Ophthalmology-04/29/21 Dexa Scan - NA Annual Well Visit - NA Micro albumin-05/21/20 Hemoglobin A1c- 08/18/21  Star Rating Drugs: Metformin 500 mg-last fill 07/30/21 90 ds Simvastatin 40 mg-last fill (Patient not sure when he had last fill can't read date on bottle)   TTroupPharmacist Assistant [phone]

## 2021-12-22 NOTE — Telephone Encounter (Signed)
Attempted to call patient to discuss medications and answer questions, no answer, left VM for patient to return call

## 2021-12-29 ENCOUNTER — Other Ambulatory Visit: Payer: Self-pay

## 2021-12-29 ENCOUNTER — Ambulatory Visit (INDEPENDENT_AMBULATORY_CARE_PROVIDER_SITE_OTHER): Payer: Medicare PPO | Admitting: Podiatry

## 2021-12-29 ENCOUNTER — Ambulatory Visit: Payer: Medicare PPO

## 2021-12-29 ENCOUNTER — Encounter: Payer: Self-pay | Admitting: Podiatry

## 2021-12-29 VITALS — Temp 97.3°F

## 2021-12-29 DIAGNOSIS — E11621 Type 2 diabetes mellitus with foot ulcer: Secondary | ICD-10-CM | POA: Diagnosis not present

## 2021-12-29 DIAGNOSIS — L97411 Non-pressure chronic ulcer of right heel and midfoot limited to breakdown of skin: Secondary | ICD-10-CM

## 2021-12-29 DIAGNOSIS — Z89411 Acquired absence of right great toe: Secondary | ICD-10-CM

## 2021-12-29 DIAGNOSIS — Z89421 Acquired absence of other right toe(s): Secondary | ICD-10-CM

## 2021-12-29 NOTE — Progress Notes (Signed)
Subjective:  Patient ID: Jason [name], male    DOB: 01-21-1941,  MRN: [id number]  Chief Complaint  Patient presents with   Foot Ulcer    The right foot is not healing like it should and it does drain some    81 y.o. male presents for wound care. Hx confirmed with patient. Has not been wearing his surgical shoe full time especially around the house. Thinks the wound has drained a bit . Pain rated 2/10. Chaging dressing daily. Denies redness/warmth/swelling to the foot. Objective:  Physical Exam: Wound Location: right submet 1 Wound Measurement: 1*0.5 Wound Base: almost wholly granular with detached wound edges. Peri-wound: Calloused, macerated Exudate: mild amount of serous drainage wound without warmth, erythema, signs of acute infection Assessment:   1. Diabetic ulcer of right midfoot associated with type 2 diabetes mellitus, limited to breakdown of skin ([place])    Plan:  Patient was evaluated and treated and all questions answered.  Ulcer righ 1st met with blister -Wound thoroughly cleansed and debrided. Improving compared to prior. -Pending DM insert/toe filller right - patient did not make appt -Offload with surgical shoe. He is not wearing this today discussed the importance of compliance with this to prevent pressure and promote healing.  Procedure: Excisional Debridement of Wound Indication: Removal of non-viable soft tissue from the wound to promote healing.  Anesthesia: none Pre-Debridement Wound Measurements: 0.5 cm x 0.5 cm x 0.3 cm  Post-Debridement Wound Measurements: 1 cm x 0.5 cm x 0.3 cm  Type of Debridement: Sharp Excisional Tissue Removed: Non-viable soft tissue Instrumentation: 15 blade and tissue nipper Depth of Debridement: subcutaneous tissue. Technique: Sharp excisional debridement to bleeding, viable wound base.  Dressing: Dry, sterile, compression dressing. Disposition: Patient tolerated procedure well.   PAD -Pending vascular studies. Given number  to call to get scheduled.  Return in about 3 weeks (around 01/19/2022).

## 2021-12-30 NOTE — Progress Notes (Signed)
SITUATION Reason for Consult: Patient seen for evaluation for diabetic shoes, custom diabetic insoles, and right toe filler. Patient / Caregiver Report: Patient is concerned about out of pocket cost and wants a pre-cert before proceeding  OBJECTIVE DATA History / Diagnosis:    ICD-10-CM   1. Diabetic ulcer of right midfoot associated with type 2 diabetes mellitus, limited to breakdown of skin ([place])  E11.621    L97.411     2. Acquired absence of right great toe ([place])  Z89.411     3. Acquired absence of other right toe(s) [facility])  [id number]      ACTIONS PERFORMED Explained potential for out of pocket cost to patient and patient requested a pre-certification to determine exact cost before proceeding further. All questions answered and concerns addressed.  PLAN Pre-cert to be obtained and patient to be contacted with estimated cost. Patient to determine whether or not they wish to proceed. Plan of care discussed with and agreed upon by patient / caregiver.

## 2022-01-14 ENCOUNTER — Other Ambulatory Visit: Payer: Self-pay

## 2022-01-14 ENCOUNTER — Ambulatory Visit (HOSPITAL_COMMUNITY)
Admission: RE | Admit: 2022-01-14 | Discharge: 2022-01-14 | Disposition: A | Discharge status: Home / Self Care | Payer: Medicare PPO | Source: Ambulatory Visit | Attending: Podiatry | Admitting: Podiatry

## 2022-01-14 DIAGNOSIS — E11621 Type 2 diabetes mellitus with foot ulcer: Secondary | ICD-10-CM | POA: Insufficient documentation

## 2022-01-14 DIAGNOSIS — L97411 Non-pressure chronic ulcer of right heel and midfoot limited to breakdown of skin: Secondary | ICD-10-CM | POA: Diagnosis not present

## 2022-01-15 ENCOUNTER — Ambulatory Visit: Payer: Medicare PPO | Admitting: General Practice

## 2022-01-18 ENCOUNTER — Ambulatory Visit: Payer: Medicare PPO | Admitting: Podiatry

## 2022-01-19 ENCOUNTER — Ambulatory Visit: Payer: Medicare PPO | Admitting: Podiatry

## 2022-01-20 NOTE — Progress Notes (Unsigned)
Cardiology Clinic Note   Patient Name: Jason [name] Date of Encounter: 01/20/2022  Primary Care Provider:  [name], MD Primary Cardiologist:  Minus Breeding, MD  Patient Profile    Jason [name] 81 year old male presents to the clinic today for follow-up evaluation of his mitral valve stenosis and atrial flutter.  Past Medical History    Past Medical History:  Diagnosis Date   Anxiety    Asthma    in past, no inhaler now   Benign neoplasm of colon    Bone infection ([place])    Cancer ([place])    bladder cancer   COPD (chronic obstructive pulmonary disease) ([place])    Depression    Diverticulosis of colon (without mention of hemorrhage)    DJD (degenerative joint disease)    Esophageal reflux    Glaucoma    Hidradenitis    History of BPH    History of gynecomastia    Unilateral   Hyperlipidemia    Irritable bowel syndrome    Memory loss    Mitral stenosis    mild-moderate MS 01/2016   MVP (mitral valve prolapse)    Obesity, unspecified    Pneumonia    Sleep apnea    does not use  cpap or bipap .. no study  ever   Type II or unspecified type diabetes mellitus without mention of complication, not stated as uncontrolled    Unspecified venous (peripheral) insufficiency    Past Surgical History:  Procedure Laterality Date   ACHILLES TENDON SURGERY Right 05/17/2019   Procedure: ACHILLES LENGTHENING/KIDNER;  Surgeon: Evelina Bucy, DPM;  Location: [facility];  Service: Podiatry;  Laterality: Right;   AMPUTATION  08/09/2012   Procedure: AMPUTATION DIGIT;  Surgeon: Newt Minion, MD;  Location: [facility];  Service: Orthopedics;  Laterality: Right;  Amputation right Great Toe MTP Joint   AMPUTATION  10/17/2012   Procedure: AMPUTATION DIGIT;  Surgeon: Newt Minion, MD;  Location: [facility];  Service: Orthopedics;  Laterality: Right;  Right Foot 3rd Toe Amputation at Metatarsophalangeal Joint   AMPUTATION TOE Right 05/17/2019   Procedure: AMPUTATION TOE Metatarsal Phalangeal Joint    FOURTH AND FIFTH, FILLETED TOE FLAP;  Surgeon: Evelina Bucy, DPM;  Location: [facility];  Service: Podiatry;  Laterality: Right;   BUBBLE STUDY  08/12/2021   Procedure: BUBBLE STUDY;  Surgeon: Geralynn Rile, MD;  Location: [facility];  Service: Cardiovascular;;   CHOLECYSTECTOMY N/A 06/14/2018   Procedure: LAPAROSCOPIC CHOLECYSTECTOMY WITH INTRAOPERATIVE CHOLANGIOGRAM;  Surgeon: Mickeal Skinner, MD;  Location: [facility];  Service: General;  Laterality: N/A;   CIRCUMCISION  06/2100   For Phimosis - Dr Hartley Barefoot   COLON SURGERY     CYSTOSCOPY W/ RETROGRADES Bilateral 07/07/2018   Procedure: CYSTOSCOPY WITH BILATERAL RETROGRADE PYELOGRAM;  Surgeon: Ardis Hughs, MD;  Location: [facility];  Service: Urology;  Laterality: Bilateral;   KNEE ARTHROSCOPY     LAPAROSCOPIC LYSIS OF ADHESIONS  06/14/2018   Procedure: LAPAROSCOPIC LYSIS OF ADHESIONS;  Surgeon: Kieth Brightly Arta Bruce, MD;  Location: [facility];  Service: General;;   LAPAROTOMY  07/20/11   LASER ABLATION  06/2009   Left Greater Saphenous vein -Dr Elicia Lamp OSTEOTOMY Right 05/17/2019   Procedure: METATARSLSECTOMY FOURTH DIGIT;  Surgeon: Evelina Bucy, DPM;  Location: [facility];  Service: Podiatry;  Laterality: Right;   MULTIPLE TOOTH EXTRACTIONS     TEE WITHOUT CARDIOVERSION N/A 08/12/2021   Procedure: TRANSESOPHAGEAL ECHOCARDIOGRAM (TEE);  Surgeon:  Geralynn Rile, MD;  Location: [facility];  Service: Cardiovascular;  Laterality: N/A;   TOE AMPUTATION  06/2009   x3Right foot second toe -Dr Beola Cord   TONSILLECTOMY     TRANSURETHRAL RESECTION OF BLADDER TUMOR N/A 07/07/2018   Procedure: TRANSURETHRAL RESECTION OF BLADDER TUMOR (TURBT) WITH POST OP INSTILLATION OF GEMCITABIN;  Surgeon: Ardis Hughs, MD;  Location: [facility];  Service: Urology;  Laterality: N/A;   WOUND DEBRIDEMENT Right 07/25/2019   Procedure: Right foot wound debridement with application of skin graft substitute;  Surgeon: Evelina Bucy, DPM;  Location:  [facility];  Service: Podiatry;  Laterality: Right;    Allergies  Allergies  Allergen Reactions   Bactrim [Sulfamethoxazole-Trimethoprim] Rash    History of Present Illness    Jason [name] has a PMH of mitral valve prolapse and moderate MR as well as mitral valve stenosis.  His PMH also includes atrial flutter, HLD, and type 2 diabetes.  He previously wore a cardiac event monitor which showed atrial flutter 100% of the time that was rate controlled.  He underwent TEE 08/12/2021 which showed a rheumatic appearing mitral valve with severely calcified anterior and posterior leaflets.  He was felt to have severe mitral stenosis and severe mitral regurgitation.  He was seen by Dr. Percival Spanish on 08/03/2021.  During that time he was shown a video animation of his valvular issues.  He was limited in his overall activity due to fatigue.  He had also had some toes amputated on his right foot.  He was somewhat active with chores around his house.  He denied any symptoms of chest discomfort neck and arm discomfort.  He denied new shortness of breath orthopnea and PND.  He denied palpitations presyncope and syncope.  After his TEE 08/12/2021 attempt for follow-up was made.  He was unable to be reached via phone and missed scheduled follow-up appointments.  Eventually he was sent a letter.  He presents to the clinic today for follow-up evaluation and states***  *** denies chest pain, shortness of breath, lower extremity edema, fatigue, palpitations, melena, hematuria, hemoptysis, diaphoresis, weakness, presyncope, syncope, orthopnea, and PND.  Home Medications    Prior to Admission medications   Medication Sig Start Date End Date Taking? Authorizing Provider  B-D UF III MINI PEN NEEDLES 31G X 5 MM MISC SMARTSIG:SUB-Q Morning-Night 07/10/21   [provider]  Blood Glucose Calibration (ACCU-CHEK AVIVA) SOLN 1 Act by In Vitro route 3 (three) times daily. 05/25/21   [name], MD  blood glucose  meter kit and supplies KIT Use to check blood sugar daily. DX: E11. 01/10/20   Hoyt Koch, MD  Blood Glucose Monitoring Suppl (ACCU-CHEK AVIVA PLUS) w/Device KIT 1 Act by Does not apply route 3 (three) times daily. 05/25/21   [name], MD  Continuous Blood Gluc Receiver (FREESTYLE LIBRE 2 READER) DEVI 1 Act by Does not apply route daily. 05/07/21   [name], MD  Continuous Blood Gluc Sensor (FREESTYLE LIBRE 2 SENSOR) MISC 1 Act by Does not apply route daily. 05/07/21   [name], MD  Glucagon (GVOKE HYPOPEN 2-PACK) 1 MG/0.2ML SOAJ Inject 1 Act into the skin daily as needed. 11/10/21   [name], MD  glucose blood (ACCU-CHEK AVIVA PLUS) test strip 1 each by Other route 3 (three) times daily. Use as instructed 05/25/21   [name], MD  insulin glargine, 2 Unit Dial, (TOUJEO MAX SOLOSTAR) 300 UNIT/ML Solostar Pen Inject 30  Units into the skin daily. 11/10/21   [name], MD  Insulin Pen Needle 32G X 6 MM MISC 1 Act by Does not apply route in the morning and at bedtime. 11/10/21   [name], MD  metFORMIN (GLUCOPHAGE) 500 MG tablet Take 2 tablets (1,000 mg total) by mouth 2 (two) times daily with a meal. 09/30/21   [name], MD  Multiple Vitamins-Minerals (MULTIVITAMIN WITH MINERALS) tablet Take 1 tablet by mouth daily.    [provider]  rivaroxaban (XARELTO) 20 MG TABS tablet Take 1 tablet (20 mg total) by mouth daily with supper. 08/03/21   Minus Breeding, MD  Semaglutide,0.25 or 0.5MG/DOS, (OZEMPIC, 0.25 OR 0.5 MG/DOSE,) 2 MG/1.5ML SOPN Inject 1 mg into the skin once a week. Patient taking differently: Inject 1 mg into the skin every Monday. 01/28/21   [name], MD  silver sulfADIAZINE (SILVADENE) 1 % cream Apply pea-sized amount to wound daily. 12/04/21   Evelina Bucy, DPM  simvastatin (ZOCOR) 40 MG tablet Take 1 tablet (40 mg total) by mouth daily at 6 PM. 01/28/21   [name], MD  triamcinolone cream (KENALOG) 0.1 %  Apply 1 application topically 2 (two) times daily. 06/16/21 06/16/22  Biagio Borg, MD  TRUEplus Safety Lancets 28G MISC 1 Act by Does not apply route in the morning and at bedtime. 01/28/21   [name], MD  Wound Dressings (MEDIHONEY WOUND/BURN DRESSING) GEL Apply to affected are 3 times a week, and cover with sterile dressing. 01/04/20   [name], MD    Family History    Family History  Problem Relation Age of Onset   Diabetes Father    Cancer Father        Prostate   Stroke Father    Prostate cancer Father    High blood pressure Other    Colon cancer Neg Hx    Esophageal cancer Neg Hx    Rectal cancer Neg Hx    Stomach cancer Neg Hx    He indicated that his mother is deceased. He indicated that his father is deceased. He indicated that his sister is alive. He indicated that his brother is deceased. He indicated that the status of his neg hx is unknown. He indicated that the status of his other is unknown.  Social History    Social History   Socioeconomic History   Marital status: Married    Spouse name: Peter Congo   Number of children: 2   Years of education: Not on file   Highest education level: Not on file  Occupational History   Occupation: retired  Tobacco Use   Smoking status: Former    Packs/day: 0.10    Years: 5.00    Pack years: 0.50    Types: Cigarettes    Quit date: 05/10/2015    Years since quitting: 6.7   Smokeless tobacco: Never  Vaping Use   Vaping Use: Never used  Substance and Sexual Activity   Alcohol use: Not Currently    Alcohol/week: 7.0 standard drinks    Types: 7 Cans of beer per week    Comment: social can of beer NONE RECENTLY   Drug use: No   Sexual activity: Not Currently  Other Topics Concern   Not on file  Social History Narrative   Not on file   Social Determinants of Health   Financial Resource Strain: Medium Risk   Difficulty of Paying Living Expenses: Somewhat hard  Food  Insecurity: Food Insecurity Present   Worried  About Charity fundraiser in the Last Year: Sometimes true   Ran Out of Food in the Last Year: Sometimes true  Transportation Needs: No Transportation Needs   Lack of Transportation (Medical): No   Lack of Transportation (Non-Medical): No  Physical Activity: Not on file  Stress: Not on file  Social Connections: Not on file  Intimate Partner Violence: Not on file     Review of Systems    General:  No chills, fever, night sweats or weight changes.  Cardiovascular:  No chest pain, dyspnea on exertion, edema, orthopnea, palpitations, paroxysmal nocturnal dyspnea. Dermatological: No rash, lesions/masses Respiratory: No cough, dyspnea Urologic: No hematuria, dysuria Abdominal:   No nausea, vomiting, diarrhea, bright red blood per rectum, melena, or hematemesis Neurologic:  No visual changes, wkns, changes in mental status. All other systems reviewed and are otherwise negative except as noted above.  Physical Exam    VS:  There were no vitals taken for this visit. , BMI There is no height or weight on file to calculate BMI. GEN: Well nourished, well developed, in no acute distress. HEENT: normal. Neck: Supple, no JVD, carotid bruits, or masses. Cardiac: RRR, no murmurs, rubs, or gallops. No clubbing, cyanosis, edema.  Radials/DP/PT 2+ and equal bilaterally.  Respiratory:  Respirations regular and unlabored, clear to auscultation bilaterally. GI: Soft, nontender, nondistended, BS + x 4. MS: no deformity or atrophy. Skin: warm and dry, no rash. Neuro:  Strength and sensation are intact. Psych: Normal affect.  Accessory Clinical Findings    Recent Labs: 05/07/2021: TSH 0.78 08/03/2021: BUN 10; Creatinine, Ser 0.98; Hemoglobin 12.1; Platelets 176; Potassium 4.7; Sodium 140   Recent Lipid Panel    Component Value Date/Time   CHOL 147 05/21/2020 1458   TRIG 100 05/21/2020 1458   HDL 49 05/21/2020 1458   CHOLHDL 3.0 05/21/2020 1458   VLDL 16.0 04/18/2019 1035   LDLCALC 79 05/21/2020  1458    ECG personally reviewed by me today- *** - No acute changes  Echocardiogram 08/12/2021  IMPRESSIONS     1. The mitral valve appears rheumatic with severely calcified anterior  and posterior leaflets. The leaflets demonstrate restricted movement in  systole/diastole. There is mixed severe mitral stenosis and severe mitral  regurgitation. The MG is 12 mmHG @  75 bpm. PHT 145 ms which equates to MVA 1.52 cm2. Direct 3D planimetry  shows MVA 1.40-1.50 cm2, consistent with severe mitral stenosis. There is  also severe, eccentric mitral regurgitation. This is due to severely  restricted movement of the posterior MV  leaflet in systole/diastole (IIIA). Regarding MR, 2D PISA 0.9 cm2, 2D ERO  0.37 cm2, R vol 54 cc. 3D VCA 0.35 cm2. There is systolic reversal of flow  in the left upper and left lower pulmonary veins. All consistent with  severe MR. Overall, there is  combined severe mitral stenosis and severe mitral regurgitation related to  rheumatic mitral valve disease with severe leaflet calcificaiton. The  mitral valve is rheumatic. Severe mitral valve regurgitation. Severe  mitral stenosis.   2. Left ventricular ejection fraction, by estimation, is 60 to 65%. The  left ventricle has normal function.   3. Right ventricular systolic function is normal. The right ventricular  size is normal.   4. Left atrial size was severely dilated. No left atrial/left atrial  appendage thrombus was detected. The LAA emptying velocity was 55 cm/s.   5. Right atrial size was mildly dilated.  6. The aortic valve is tricuspid. There is mild calcification of the  aortic valve. Aortic valve regurgitation is not visualized.   7. There is mild (Grade II) layered plaque involving the descending  aorta.   8. Agitated saline contrast bubble study was positive with shunting  observed after >6 cardiac cycles suggestive of intrapulmonary shunting.  Assessment & Plan   1.  Mitral stenosis-no increased  DOE or activity intolerance.  TEE 08/12/2021 showed severe mitral stenosis and calcification which was felt to be related to rheumatic disease.  Details above.  TEE reviewed and patient expressed understanding. Offered referral for surgery.  Patient wishes to defer surgery at this time.  Atrial flutter-EKG today shows***.  Was noted to have 100% atrial flutter on cardiac event monitor.  Reports compliance with Xarelto and denies bleeding issues. Continue Xarelto  Hyperlipidemia-LDL***. Continue simvastatin Heart healthy low-sodium high-fiber diet Increase physical activity as tolerated  Type 2 diabetes-glucose 162 on 08/03/2021. Continue Ozempic, metformin, insulin Heart healthy low-sodium carb modified diet Increase physical activity as tolerated Follows with PCP  Disposition: Follow-up with Dr. Percival Spanish in 3-4 months.   Jossie Ng.  NP-C    01/20/2022, 7:44 AM [address] Office [phone] Fax [phone]  Notice: This dictation was prepared with Dragon dictation along with smaller phrase technology. Any transcriptional errors that result from this process are unintentional and may not be corrected upon review.  I spent***minutes examining this patient, reviewing medications, and using patient centered shared decision making involving her cardiac care.  Prior to her visit I spent greater than 20 minutes reviewing her past medical history,  medications, and prior cardiac tests.

## 2022-01-25 ENCOUNTER — Ambulatory Visit: Payer: Medicare PPO | Admitting: General Practice

## 2022-01-26 ENCOUNTER — Ambulatory Visit (INDEPENDENT_AMBULATORY_CARE_PROVIDER_SITE_OTHER): Payer: Medicare PPO | Admitting: Podiatry

## 2022-01-26 ENCOUNTER — Other Ambulatory Visit: Payer: Self-pay

## 2022-01-26 ENCOUNTER — Encounter: Payer: Self-pay | Admitting: Podiatry

## 2022-01-26 DIAGNOSIS — L97411 Non-pressure chronic ulcer of right heel and midfoot limited to breakdown of skin: Secondary | ICD-10-CM

## 2022-01-26 DIAGNOSIS — E11621 Type 2 diabetes mellitus with foot ulcer: Secondary | ICD-10-CM

## 2022-01-26 NOTE — Progress Notes (Signed)
?  Subjective:  ?Patient ID: Jason [name], male    DOB: 12/21/1940,  MRN: [id number] ? ?Chief Complaint  ?Patient presents with  ? Wound Check  ?  Right foot wound check  ? ?81 y.o. male presents for wound care. Hx confirmed with patient. Has not been wearing his surgical shoe full time especially around the house.  Has not been changing dressing.  Pain rated 2/10. Chaging dressing daily. Denies redness/warmth/swelling to the foot. ?Objective:  ?Physical Exam: ?Wound Location: right submet 1 ?Wound Measurement: 0.5x 0.5 ?Wound Base: almost wholly granular with detached wound edges. ?Peri-wound: Calloused, macerated ?Exudate: mild amount of serous drainage ?wound without warmth, erythema, signs of acute infection ?Assessment:  ? ?1. Diabetic ulcer of right midfoot associated with type 2 diabetes mellitus, limited to breakdown of skin ([place])   ? ?Plan:  ?Patient was evaluated and treated and all questions answered. ? ?Ulcer righ 1st met with blister ?-Wound thoroughly cleansed and debrided. Improving compared to prior. ?-Pending DM insert/toe filller right  ?-Offload with surgical shoe.  ?Discussed if any worsening or wound to report to the ED ?Discussed glucose control.  ? ?Procedure: Excisional Debridement of Wound ?Indication: Removal of non-viable soft tissue from the wound to promote healing.  ?Anesthesia: none ?Pre-Debridement Wound Measurements: Overlying callus ?Post-Debridement Wound Measurements: 0.5 cm x 0.5 cm x 0.3 cm  ?Type of Debridement: Sharp Excisional ?Tissue Removed: Non-viable soft tissue ?Instrumentation: 15 blade and tissue nipper ?Depth of Debridement: subcutaneous tissue. ?Technique: Sharp excisional debridement to bleeding, viable wound base.  ?Dressing: Dry, sterile, compression dressing. ?Disposition: Patient tolerated procedure well.  ? ?PAD ?-Vascular studies within normal range.  ? ?Return in about 2 weeks (around 02/09/2022) for wound check. ? ?

## 2022-01-26 NOTE — Progress Notes (Signed)
Wound

## 2022-01-27 ENCOUNTER — Ambulatory Visit (INDEPENDENT_AMBULATORY_CARE_PROVIDER_SITE_OTHER): Payer: Medicare PPO

## 2022-01-27 ENCOUNTER — Telehealth: Payer: Self-pay

## 2022-01-27 DIAGNOSIS — I4892 Unspecified atrial flutter: Secondary | ICD-10-CM

## 2022-01-27 DIAGNOSIS — E785 Hyperlipidemia, unspecified: Secondary | ICD-10-CM

## 2022-01-27 DIAGNOSIS — I739 Peripheral vascular disease, unspecified: Secondary | ICD-10-CM

## 2022-01-27 DIAGNOSIS — E119 Type 2 diabetes mellitus without complications: Secondary | ICD-10-CM

## 2022-01-27 DIAGNOSIS — E1151 Type 2 diabetes mellitus with diabetic peripheral angiopathy without gangrene: Secondary | ICD-10-CM

## 2022-01-27 NOTE — Telephone Encounter (Signed)
Patient Assistance Applications for Xarelto, Toujeo, and ozempic placed out front for patient to complete his portion ? ?Xarelto '20mg'$  samples placed out front for patient as well ? ?Lot: [id number] ?Exp: 02/13/2024 ?#28 tablets  ? ?Tomasa Blase, PharmD ?Clinical Pharmacist, [facility]  ? ? ?

## 2022-01-27 NOTE — Progress Notes (Signed)
? ?Chronic Care Management ?Pharmacy Note ? ?01/27/2022 ?Name:  Jason [name] MRN:  [id number] DOB:  1941-08-31 ? ?Summary: ?-Patient reports that he has been noncompliant with medications - notes that the only medication he is currently taking is his metformin ?-Unsure of current BG's as he has not been checking  ? ?Recommendations/Changes made from today's visit: ?-Reviewed with patient importance of compliance to medications - reviewed purpose of medications in maintaining health ?-Patient assistance applications for ozemic, toujeo, and xarelto started, placed out front for patient to complete  ?-Xarelto samples placed out front for patient to be able to restart anticoagulation  ?-Patient to start checking BG daily and record in logbook to discuss with next visit  ? ? ?Subjective: ?Jason [name] is an 81 y.o. year old male who is a primary patient of [name], MD.  The CCM team was consulted for assistance with disease management and care coordination needs.   ? ?Engaged with patient by telephone for follow up visit in response to provider referral for pharmacy case management and/or care coordination services.  ? ?Consent to Services:  ?The patient was given information about Chronic Care Management services, agreed to services, and gave verbal consent prior to initiation of services.  Please see initial visit note for detailed documentation.  ? ?Patient Care Team: ?[name], MD as PCP - General (Internal Medicine) ?Minus Breeding, MD as PCP - Cardiology (Cardiology) ?Tomasa Blase, [facility] (Pharmacist) ? ?Recent office visits: ?08/18/2021 - Dr. Quay Burow - Depression - declined medication - referred to therapist at behavioral health - referred to neurology for memory issues - noted noncomliance with medications - f/u in 3 months  ? ?Recent consult visits: ?01/26/2022 - Dr. Blenda Mounts - Podiatry - ulcer (right foot) - wound debridement performed - f/u in 2 weeks  ?08/03/2021 - Dr. Percival Spanish - Cardiology -  stop ASA, restart xarelto - follow up in 6 months  ? ?Hospital visits: ?08/12/2021 - [facility]  ? ?Objective: ? ?Lab Results  ?Component Value Date  ? CREATININE 0.98 08/03/2021  ? BUN 10 08/03/2021  ? GFR 57.09 (L) 05/07/2021  ? GFRNONAA >60 05/17/2019  ? GFRAA >60 05/17/2019  ? NA 140 08/03/2021  ? K 4.7 08/03/2021  ? CALCIUM 9.6 08/03/2021  ? CO2 26 08/03/2021  ? GLUCOSE 162 (H) 08/03/2021  ? ? ?Lab Results  ?Component Value Date/Time  ? HGBA1C 7.5 (A) 08/18/2021 02:40 PM  ? HGBA1C 7.5 08/18/2021 02:40 PM  ? HGBA1C 7.5 (A) 08/18/2021 02:40 PM  ? HGBA1C 7.5 (A) 08/18/2021 02:40 PM  ? HGBA1C 9.3 (H) 05/07/2021 01:54 PM  ? HGBA1C 8.2 (H) 01/06/2021 11:51 AM  ? GFR 57.09 (L) 05/07/2021 01:54 PM  ? GFR 76.50 01/06/2021 11:51 AM  ? MICROALBUR 2.1 05/21/2020 02:58 PM  ? MICROALBUR 11.3 (H) 04/18/2019 10:43 AM  ?  ?Last diabetic Eye exam:  ?Lab Results  ?Component Value Date/Time  ? HMDIABEYEEXA No Retinopathy 04/29/2021 12:00 AM  ?  ?Last diabetic Foot exam:  ?Lab Results  ?Component Value Date/Time  ? HMDIABFOOTEX done 08/09/2014 12:00 AM  ?  ? ?Lab Results  ?Component Value Date  ? CHOL 147 05/21/2020  ? HDL 49 05/21/2020  ? [place] 79 05/21/2020  ? TRIG 100 05/21/2020  ? CHOLHDL 3.0 05/21/2020  ? ? ?Hepatic Function Latest Ref Rng & Units 05/21/2020 05/17/2019 04/18/2019  ?Total Protein 6.1 - 8.1 g/dL 6.7 6.5 7.1  ?Albumin 3.5 - 5.0 g/dL - 3.6 4.1  ?AST 10 -  35 U/L '12 19 12  ' ?ALT 9 - 46 U/L '11 18 13  ' ?Alk Phosphatase 38 - 126 U/L - 32(L) 43  ?Total Bilirubin 0.2 - 1.2 mg/dL 0.6 1.5(H) 0.9  ?Bilirubin, Direct 0.0 - 0.2 mg/dL 0.2 - -  ? ? ?Lab Results  ?Component Value Date/Time  ? TSH 0.78 05/07/2021 01:54 PM  ? TSH 0.85 04/18/2019 10:35 AM  ? ? ?CBC Latest Ref Rng & Units 08/03/2021 09/30/2020 06/30/2020  ?WBC 3.4 - 10.8 x10E3/uL 4.6 4.1 4.6  ?Hemoglobin 13.0 - 17.7 g/dL 12.1(L) 13.1 12.6(L)  ?Hematocrit 37.5 - 51.0 % 35.5(L) 39.1 38.3(L)  ?Platelets 150 - 450 x10E3/uL 176 165.0 159  ? ? ?No results found for: VD25OH ? ?Clinical  ASCVD: Yes  - PAD ?The 10-year ASCVD risk score (Arnett DK, et al., 2019) is: 36.5% ?  Values used to calculate the score: ?    Age: 52 years ?    Sex: Male ?    Is Non-Hispanic African American: Yes ?    Diabetic: Yes ?    Tobacco smoker: Yes ?    Systolic Blood Pressure: 563 mmHg ?    Is BP treated: No ?    HDL Cholesterol: 49 mg/dL ?    Total Cholesterol: 147 mg/dL   ? ?Depression screen [facility] 2/9 05/13/2021 10/20/2020 12/31/2019  ?Decreased Interest 0 1 2  ?Down, Depressed, Hopeless 1 2 0  ?PHQ - 2 Score '1 3 2  ' ?Altered sleeping - - 1  ?Tired, decreased energy - - 1  ?Change in appetite - - 1  ?Feeling bad or failure about yourself  - - 0  ?Trouble concentrating - - 1  ?Moving slowly or fidgety/restless - - 1  ?Suicidal thoughts - - 0  ?PHQ-9 Score - - 7  ?Difficult doing work/chores - - Not difficult at all  ?Some recent data might be hidden  ?  ?CHA2DS2-VASc Score = 5  ?The patient's score is based upon: ?CHF History: 0 ?HTN History: 1 ?Diabetes History: 1 ?Stroke History: 0 ?Vascular Disease History: 1 ?Age Score: 2 ?Gender Score: 0 ? ?   ? ? ?Social History  ? ?Tobacco Use  ?Smoking Status Former  ? Packs/day: 0.10  ? Years: 5.00  ? Pack years: 0.50  ? Types: Cigarettes  ? Quit date: 05/10/2015  ? Years since quitting: 6.7  ?Smokeless Tobacco Never  ? ?BP Readings from Last 3 Encounters:  ?08/18/21 120/60  ?08/12/21 (!) 145/63  ?08/03/21 126/72  ? ?Pulse Readings from Last 3 Encounters:  ?08/18/21 65  ?08/12/21 67  ?08/03/21 67  ? ?Wt Readings from Last 3 Encounters:  ?08/18/21 289 lb (131.1 kg)  ?08/12/21 296 lb (134.3 kg)  ?08/03/21 293 lb 9.6 oz (133.2 kg)  ? ?BMI Readings from Last 3 Encounters:  ?08/18/21 31.75 kg/m?  ?08/12/21 32.52 kg/m?  ?08/03/21 32.25 kg/m?  ? ? ?Assessment/Interventions: Review of patient past medical history, allergies, medications, health status, including review of consultants reports, laboratory and other test data, was performed as part of comprehensive evaluation and provision of  chronic care management services.  ? ?SDOH:  (Social Determinants of Health) assessments and interventions performed: Yes ? ? ?SDOH Screenings  ? ?Alcohol Screen: Not on file  ?Depression (PHQ2-9): Low Risk   ? PHQ-2 Score: 1  ?Financial Resource Strain: Medium Risk  ? Difficulty of Paying Living Expenses: Somewhat hard  ?Food Insecurity: Food Insecurity Present  ? Worried About Charity fundraiser in the Last Year: Sometimes true  ?  Ran Out of Food in the Last Year: Sometimes true  ?Housing: Medium Risk  ? Last Housing Risk Score: 1  ?Physical Activity: Not on file  ?Social Connections: Not on file  ?Stress: Not on file  ?Tobacco Use: Medium Risk  ? Smoking Tobacco Use: Former  ? Smokeless Tobacco Use: Never  ? Passive Exposure: Not on file  ?Transportation Needs: No Transportation Needs  ? Lack of Transportation (Medical): No  ? Lack of Transportation (Non-Medical): No  ? ? ?CCM Care Plan ? ?Allergies  ?Allergen Reactions  ? Bactrim [Sulfamethoxazole-Trimethoprim] Rash  ? ? ?Medications Reviewed Today   ? ? Reviewed by Lorenda Peck, MD (Physician) on 01/26/22 at 1133  Med List Status: <None>  ? ?Medication Order Taking? Sig Documenting Provider Last Dose Status Informant  ?B-D UF III MINI PEN NEEDLES 31G X 5 MM MISC [id number]  SMARTSIG:SUB-Q Morning-Night [provider]  Active   ?Blood Glucose Calibration (ACCU-CHEK AVIVA) SOLN [id number] No 1 Act by In Vitro route 3 (three) times daily. [name], MD Taking Active Self  ?blood glucose meter kit and supplies KIT [id number] No Use to check blood sugar daily. DX: E11. Hoyt Koch, MD Taking Active Self  ?Blood Glucose Monitoring Suppl (ACCU-CHEK AVIVA PLUS) w/Device KIT [id number] No 1 Act by Does not apply route 3 (three) times daily. [name], MD Taking Active Self  ?Continuous Blood Gluc Receiver (FREESTYLE LIBRE 2 READER) DEVI [id number] No 1 Act by Does not apply route daily. [name], MD Taking Active Self  ?Continuous  Blood Gluc Sensor (FREESTYLE LIBRE 2 SENSOR) MISC [id number] No 1 Act by Does not apply route daily. [name], MD Taking Active Self  ?Glucagon (GVOKE HYPOPEN 2-PACK) 1 MG/0.2ML SOAJ [id number]  Inject

## 2022-01-27 NOTE — Patient Instructions (Signed)
Visit Information ? ?Following are the goals we discussed today:  ? ?Manage My Medicine  ? ?Timeframe:  Long-Range Goal ?Priority:  High ?Start Date:      02/05/21                       ?Expected End Date:  01/28/2023                ? ?Follow Up Date April 2023 ?  ?- call for medicine refill 2 or 3 days before it runs out ?- call if I am sick and can't take my medicine ?- keep a list of all the medicines I take; vitamins and herbals too ?- use a pillbox to sort medicine  ?- Let the doctors know when you cannot afford or you do not know how to take a medication ?  ?Why is this important?   ?These steps will help you keep on track with your medicines. ? ?Plan: Telephone follow up appointment with care management team member scheduled for:  1 month ?The patient has been provided with contact information for the care management team and has been advised to call with any health related questions or concerns.  ? ?Tomasa Blase, PharmD ?Clinical Pharmacist, [facility]  ? ?Please call the care guide team at [phone] if you need to cancel or reschedule your appointment.  ? ?The patient verbalized understanding of instructions, educational materials, and care plan provided today and declined offer to receive copy of patient instructions, educational materials, and care plan.  ? ?

## 2022-02-04 ENCOUNTER — Encounter: Payer: Self-pay | Admitting: General Practice

## 2022-02-09 ENCOUNTER — Telehealth: Payer: Self-pay | Admitting: Internal Medicine

## 2022-02-09 ENCOUNTER — Ambulatory Visit (INDEPENDENT_AMBULATORY_CARE_PROVIDER_SITE_OTHER): Payer: Self-pay | Admitting: Podiatry

## 2022-02-09 DIAGNOSIS — Z91199 Patient's noncompliance with other medical treatment and regimen due to unspecified reason: Secondary | ICD-10-CM

## 2022-02-09 NOTE — Telephone Encounter (Signed)
Needing last set of office notes for order sent over for [employer]. States it was missing.  ? ? ?Fax- [phone]  ?

## 2022-02-09 NOTE — Progress Notes (Signed)
No show

## 2022-02-09 NOTE — Telephone Encounter (Signed)
08/18/21 OV note has been faxed ?

## 2022-02-12 DIAGNOSIS — Z794 Long term (current) use of insulin: Secondary | ICD-10-CM | POA: Diagnosis not present

## 2022-02-12 DIAGNOSIS — E1159 Type 2 diabetes mellitus with other circulatory complications: Secondary | ICD-10-CM

## 2022-02-12 DIAGNOSIS — I4892 Unspecified atrial flutter: Secondary | ICD-10-CM | POA: Diagnosis not present

## 2022-02-12 NOTE — Telephone Encounter (Signed)
Rep w/ [employer] states they have not received last encounter ov notes and requesting notes to be re-faxed ? ?Fax- [phone]  ?

## 2022-02-15 NOTE — Telephone Encounter (Signed)
08/18/21 OV note has been re-faxed ?

## 2022-02-18 DIAGNOSIS — E11621 Type 2 diabetes mellitus with foot ulcer: Secondary | ICD-10-CM | POA: Diagnosis not present

## 2022-02-18 DIAGNOSIS — E1151 Type 2 diabetes mellitus with diabetic peripheral angiopathy without gangrene: Secondary | ICD-10-CM | POA: Diagnosis not present

## 2022-02-20 ENCOUNTER — Emergency Department (HOSPITAL_COMMUNITY)
Admission: EM | Admit: 2022-02-20 | Discharge: 2022-02-20 | Disposition: A | Discharge status: Home / Self Care | Payer: Medicare HMO | Attending: Emergency Medicine | Admitting: Emergency Medicine

## 2022-02-20 ENCOUNTER — Emergency Department (HOSPITAL_COMMUNITY): Payer: Medicare HMO

## 2022-02-20 ENCOUNTER — Other Ambulatory Visit: Payer: Self-pay

## 2022-02-20 ENCOUNTER — Ambulatory Visit (HOSPITAL_COMMUNITY)
Admission: RE | Admit: 2022-02-20 | Discharge: 2022-02-20 | Disposition: A | Discharge status: Home / Self Care | Payer: Medicare HMO | Source: Ambulatory Visit | Attending: Internal Medicine | Admitting: Internal Medicine

## 2022-02-20 ENCOUNTER — Encounter (HOSPITAL_COMMUNITY): Payer: Self-pay | Admitting: Emergency Medicine

## 2022-02-20 VITALS — BP 114/59 | HR 69 | Temp 97.6°F | Resp 18

## 2022-02-20 DIAGNOSIS — Z8551 Personal history of malignant neoplasm of bladder: Secondary | ICD-10-CM | POA: Diagnosis not present

## 2022-02-20 DIAGNOSIS — M5441 Lumbago with sciatica, right side: Secondary | ICD-10-CM

## 2022-02-20 DIAGNOSIS — E119 Type 2 diabetes mellitus without complications: Secondary | ICD-10-CM | POA: Diagnosis not present

## 2022-02-20 DIAGNOSIS — M545 Low back pain, unspecified: Secondary | ICD-10-CM | POA: Diagnosis not present

## 2022-02-20 DIAGNOSIS — M5442 Lumbago with sciatica, left side: Secondary | ICD-10-CM

## 2022-02-20 DIAGNOSIS — R32 Unspecified urinary incontinence: Secondary | ICD-10-CM

## 2022-02-20 DIAGNOSIS — Z7901 Long term (current) use of anticoagulants: Secondary | ICD-10-CM | POA: Insufficient documentation

## 2022-02-20 DIAGNOSIS — W19XXXA Unspecified fall, initial encounter: Secondary | ICD-10-CM

## 2022-02-20 DIAGNOSIS — J45909 Unspecified asthma, uncomplicated: Secondary | ICD-10-CM | POA: Insufficient documentation

## 2022-02-20 DIAGNOSIS — Z794 Long term (current) use of insulin: Secondary | ICD-10-CM | POA: Diagnosis not present

## 2022-02-20 DIAGNOSIS — Z043 Encounter for examination and observation following other accident: Secondary | ICD-10-CM | POA: Diagnosis not present

## 2022-02-20 DIAGNOSIS — Z85038 Personal history of other malignant neoplasm of large intestine: Secondary | ICD-10-CM | POA: Insufficient documentation

## 2022-02-20 DIAGNOSIS — M5136 Other intervertebral disc degeneration, lumbar region: Secondary | ICD-10-CM | POA: Diagnosis not present

## 2022-02-20 DIAGNOSIS — W07XXXA Fall from chair, initial encounter: Secondary | ICD-10-CM | POA: Diagnosis not present

## 2022-02-20 DIAGNOSIS — R102 Pelvic and perineal pain: Secondary | ICD-10-CM | POA: Diagnosis not present

## 2022-02-20 DIAGNOSIS — Z7984 Long term (current) use of oral hypoglycemic drugs: Secondary | ICD-10-CM | POA: Diagnosis not present

## 2022-02-20 DIAGNOSIS — R159 Full incontinence of feces: Secondary | ICD-10-CM | POA: Diagnosis not present

## 2022-02-20 DIAGNOSIS — J449 Chronic obstructive pulmonary disease, unspecified: Secondary | ICD-10-CM | POA: Insufficient documentation

## 2022-02-20 DIAGNOSIS — S3993XA Unspecified injury of pelvis, initial encounter: Secondary | ICD-10-CM | POA: Diagnosis not present

## 2022-02-20 MED ORDER — CYCLOBENZAPRINE HCL 10 MG PO TABS: 10.0000 mg | ORAL_TABLET | Freq: Two times a day (BID) | ORAL | 0 refills | Status: DC | PRN

## 2022-02-20 MED ORDER — LIDOCAINE 5 % EX PTCH: 1.0000 | MEDICATED_PATCH | CUTANEOUS | 0 refills | Status: DC

## 2022-02-20 MED ORDER — HYDROCODONE-ACETAMINOPHEN 5-325 MG PO TABS
1.0000 | ORAL_TABLET | Freq: Once | ORAL | Status: AC
  Administered 2022-02-20: 1 via ORAL
  Filled 2022-02-20: qty 1

## 2022-02-20 NOTE — Discharge Instructions (Signed)
-   Please go directly to the Emergency Room for further evaluation of the incontinence and back pain ?

## 2022-02-20 NOTE — ED Notes (Signed)
Patient is being discharged from the Urgent Care and sent to the Emergency Department via POV . Per Noemi Chapel NP, patient is in need of higher level of care due to back pain and excessive incontinence. Patient is aware and verbalizes understanding of plan of care.  ?Vitals:  ? 02/20/22 1029  ?BP: (!) 114/59  ?Pulse: 69  ?Resp: 18  ?Temp: 97.6 ?F (36.4 ?C)  ?SpO2: 100%  ? ? ?

## 2022-02-20 NOTE — Care Management (Signed)
TOC consult for Home Health and DME patient haws a walker at home, explained [facility] patient agrees with PT for [facility]. With goal of core and back strengthening. [employer] notified and accepted. Company written on AVS and told to patient.  ?

## 2022-02-20 NOTE — ED Provider Notes (Signed)
?[facility] ? ? ? ?CSN: [id number] ?Arrival date & time: 02/20/22  1019 ? ? ?  ? ?History   ?Chief Complaint ?Chief Complaint  ?Patient presents with  ? Back Pain  ?  Entered by patient  ? ? ?HPI ?Jason [name] is a 81 y.o. male.  ? ?Patient presents for acute low back pain rating down both legs after falling off a stool at home about 4 days ago.  He fell off a stool in his bathroom directly onto his backside.  He reports urinary and bowel incontinence have both worsened since falling off a stool.  He also reports he feels his legs are weaker than normal.  He denies fevers, nausea/vomiting, change in mental status.  He did not lose consciousness or hit his head. ? ?He has a history of type 2 diabetes and has had multiple toes amputated as a result of necrosis of the bone. ? ? ?Past Medical History:  ?Diagnosis Date  ? Anxiety   ? Asthma   ? in past, no inhaler now  ? Benign neoplasm of colon   ? Bone infection ([place])   ? Cancer [facility])   ? bladder cancer  ? COPD (chronic obstructive pulmonary disease) ([place])   ? Depression   ? Diverticulosis of colon (without mention of hemorrhage)   ? DJD (degenerative joint disease)   ? Esophageal reflux   ? Glaucoma   ? Hidradenitis   ? History of BPH   ? History of gynecomastia   ? Unilateral  ? Hyperlipidemia   ? Irritable bowel syndrome   ? Memory loss   ? Mitral stenosis   ? mild-moderate MS 01/2016  ? MVP (mitral valve prolapse)   ? Obesity, unspecified   ? Pneumonia   ? Sleep apnea   ? does not use  cpap or bipap .. no study  ever  ? Type II or unspecified type diabetes mellitus without mention of complication, not stated as uncontrolled   ? Unspecified venous (peripheral) insufficiency   ? ? ?Patient Active Problem List  ? Diagnosis Date Noted  ? Memory difficulties 08/18/2021  ? Depression 08/18/2021  ? Atrial flutter ([place]) 08/02/2021  ? Dental infection 06/16/2021  ? Contact dermatitis 06/16/2021  ? Nonrheumatic mitral valve stenosis 01/27/2021  ? Atherosclerosis  of aorta ([place]) 09/30/2020  ? DOE (dyspnea on exertion) 09/30/2020  ? Abnormal electrocardiogram (ECG) (EKG) 09/30/2020  ? Thiamine deficiency 07/04/2020  ? Ulcer of toe, right, with necrosis of bone ([place])   ? Diabetic ulcer of toe of right foot associated with type 2 diabetes mellitus, with fat layer exposed ([place]) 04/18/2019  ? Malignant neoplasm of urinary bladder ([place]) 07/11/2018  ? Chronic idiopathic constipation 06/12/2018  ? Diabetic infection of right foot ([place]) 07/12/2017  ? Foot ulcer, right, with unspecified severity ([place]) 01/17/2017  ? Complex sleep apnea syndrome 01/12/2016  ? Diabetes mellitus type 2, uncontrolled, with complications 19/62/2297  ? OSA (obstructive sleep apnea) 12/18/2015  ? Benign prostatic hyperplasia 02/18/2014  ? Routine general medical examination at a health care facility 02/18/2014  ? PAD (peripheral artery disease) ([place]) 02/18/2014  ? DEGENERATIVE JOINT DISEASE 02/20/2008  ? Diabetes mellitus type 2 with atherosclerosis of arteries of extremities ([place]) 01/26/2008  ? Hyperlipidemia with target LDL less than 70 01/26/2008  ? Morbid obesity ([place]) 01/26/2008  ? Mitral valve disorder 01/26/2008  ? GERD 01/26/2008  ? Irritable bowel syndrome 01/26/2008  ? ? ?Past Surgical History:  ?Procedure Laterality Date  ? ACHILLES  TENDON SURGERY Right 05/17/2019  ? Procedure: ACHILLES LENGTHENING/KIDNER;  Surgeon: Evelina Bucy, DPM;  Location: [facility];  Service: Podiatry;  Laterality: Right;  ? AMPUTATION  08/09/2012  ? Procedure: AMPUTATION DIGIT;  Surgeon: Newt Minion, MD;  Location: [facility];  Service: Orthopedics;  Laterality: Right;  Amputation right Great Toe MTP Joint  ? AMPUTATION  10/17/2012  ? Procedure: AMPUTATION DIGIT;  Surgeon: Newt Minion, MD;  Location: [facility];  Service: Orthopedics;  Laterality: Right;  Right Foot 3rd Toe Amputation at Metatarsophalangeal Joint  ? AMPUTATION TOE Right 05/17/2019  ? Procedure: AMPUTATION TOE Metatarsal Phalangeal Joint   FOURTH AND FIFTH, FILLETED TOE  FLAP;  Surgeon: Evelina Bucy, DPM;  Location: [facility];  Service: Podiatry;  Laterality: Right;  ? BUBBLE STUDY  08/12/2021  ? Procedure: BUBBLE STUDY;  Surgeon: Geralynn Rile, MD;  Location: [facility];  Service: Cardiovascular;;  ? CHOLECYSTECTOMY N/A 06/14/2018  ? Procedure: LAPAROSCOPIC CHOLECYSTECTOMY WITH INTRAOPERATIVE CHOLANGIOGRAM;  Surgeon: Kinsinger, Arta Bruce, MD;  Location: [facility];  Service: General;  Laterality: N/A;  ? CIRCUMCISION  06/2100  ? For Phimosis - Dr Hartley Barefoot  ? COLON SURGERY    ? CYSTOSCOPY W/ RETROGRADES Bilateral 07/07/2018  ? Procedure: CYSTOSCOPY WITH BILATERAL RETROGRADE PYELOGRAM;  Surgeon: Ardis Hughs, MD;  Location[facility];  Service: Urology;  Laterality: Bilateral;  ? KNEE ARTHROSCOPY    ? LAPAROSCOPIC LYSIS OF ADHESIONS  06/14/2018  ? Procedure: LAPAROSCOPIC LYSIS OF ADHESIONS;  Surgeon: Kinsinger, Arta Bruce, MD;  Location: [facility];  Service: General;;  ? LAPAROTOMY  07/20/11  ? LASER ABLATION  06/2009  ? Left Greater Saphenous vein -Dr Donnetta Hutching  ? METATARSAL OSTEOTOMY Right 05/17/2019  ? Procedure: METATARSLSECTOMY FOURTH DIGIT;  Surgeon: Evelina Bucy, DPM;  Location: [facility];  Service: Podiatry;  Laterality: Right;  ? MULTIPLE TOOTH EXTRACTIONS    ? TEE WITHOUT CARDIOVERSION N/A 08/12/2021  ? Procedure: TRANSESOPHAGEAL ECHOCARDIOGRAM (TEE);  Surgeon: Geralynn Rile, MD;  Location: [facility];  Service: Cardiovascular;  Laterality: N/A;  ? TOE AMPUTATION  06/2009  ? x3Right foot second toe -Dr Beola Cord  ? TONSILLECTOMY    ? TRANSURETHRAL RESECTION OF BLADDER TUMOR N/A 07/07/2018  ? Procedure: TRANSURETHRAL RESECTION OF BLADDER TUMOR (TURBT) WITH POST OP INSTILLATION OF GEMCITABIN;  Surgeon: Ardis Hughs, MD;  [facility];  Service: Urology;  Laterality: N/A;  ? WOUND DEBRIDEMENT Right 07/25/2019  ? Procedure: Right foot wound debridement with application of skin graft substitute;  Surgeon: Evelina Bucy, DPM;  Location[facility];  Service: Podiatry;   Laterality: Right;  ? ? ? ? ? ?Home Medications   ? ?Prior to Admission medications   ?Medication Sig Start Date End Date Taking? Authorizing Provider  ?B-D UF III MINI PEN NEEDLES 31G X 5 MM MISC SMARTSIG:SUB-Q Morning-Night 07/10/21   [provider]  ?Blood Glucose Calibration (ACCU-CHEK AVIVA) SOLN 1 Act by In Vitro route 3 (three) times daily. 05/25/21   [name], MD  ?blood glucose meter kit and supplies KIT Use to check blood sugar daily. DX: E11. 01/10/20   Hoyt Koch, MD  ?Blood Glucose Monitoring Suppl (ACCU-CHEK AVIVA PLUS) w/Device KIT 1 Act by Does not apply route 3 (three) times daily. 05/25/21   [name], MD  ?Continuous Blood Gluc Receiver (FREESTYLE LIBRE 2 READER) DEVI 1 Act by Does not apply route daily. 05/07/21   [name], MD  ?Continuous Blood Gluc Sensor (FREESTYLE LIBRE 2  SENSOR) MISC 1 Act by Does not apply route daily. 05/07/21   [name], MD  ?Glucagon (GVOKE HYPOPEN 2-PACK) 1 MG/0.2ML SOAJ Inject 1 Act into the skin daily as needed. 11/10/21   [name], MD  ?glucose blood (ACCU-CHEK AVIVA PLUS) test strip 1 each by Other route 3 (three) times daily. Use as instructed 05/25/21   [name], MD  ?insulin glargine, 2 Unit Dial, (TOUJEO MAX SOLOSTAR) 300 UNIT/ML Solostar Pen Inject 30 Units into the skin daily. ?Patient not taking: Reported on 01/27/2022 11/10/21   [name], MD  ?Insulin Pen Needle 32G X 6 MM MISC 1 Act by Does not apply route in the morning and at bedtime. ?Patient not taking: Reported on 01/27/2022 11/10/21   [name], MD  ?metFORMIN (GLUCOPHAGE) 500 MG tablet Take 2 tablets (1,000 mg total) by mouth 2 (two) times daily with a meal. 09/30/21   [name], MD  ?Multiple Vitamins-Minerals (MULTIVITAMIN WITH MINERALS) tablet Take 1 tablet by mouth daily.    [provider]  ?rivaroxaban (XARELTO) 20 MG TABS tablet Take 1 tablet (20 mg total) by mouth daily with supper. ?Patient not taking: Reported  on 01/27/2022 08/03/21   Minus Breeding, MD  ?Semaglutide,0.25 or 0.5MG/DOS, (OZEMPIC, 0.25 OR 0.5 MG/DOSE,) 2 MG/1.5ML SOPN Inject 1 mg into the skin once a week. ?Patient not taking: Reported on 01/28/19

## 2022-02-20 NOTE — ED Provider Notes (Signed)
?Jason [name] ?Provider Note ? ? ?CSN: [id number] ?Arrival date & time: 02/20/22  1146 ? ?  ? ?History ? ?Chief Complaint  ?Patient presents with  ? Back Pain  ? ? ?Jason [name] is a 81 y.o. male. ? ?HPI ? ?  ? ?81yo male with history of hyperlipidemia, COPD, bladder cancer, type 2 DM, BPH, atrial flutter (prescribed xarelto, initially reports no blood thinners but then states he is taking it) who presents from urgent care with concern for back pain after a fall.  ? ?2 days ago (this is third day of pain) fell off of a stool directly onto his bottom. Has been able to ambulate since that time but with pain. Has pain lower back and pelvis/sacrum.  Reports he has history of chronic urinary incontinence prior to the fall which is unchanged. Denies incontinence of stool. Denies fevers. Denies numbness/weakness on my history. Reports he just has pain of his back. He went to urgent care and was sent here.  He did not hit his head, no headache, no neck pain, no nausea, no vomiting or confusion.  No dysuria. At times has lower abdominal discomfort but no acute change over last few days.  ? ? ?Past Medical History:  ?Diagnosis Date  ? Anxiety   ? Asthma   ? in past, no inhaler now  ? Benign neoplasm of colon   ? Bone infection ([place])   ? Cancer [facility])   ? bladder cancer  ? COPD (chronic obstructive pulmonary disease) ([place])   ? Depression   ? Diverticulosis of colon (without mention of hemorrhage)   ? DJD (degenerative joint disease)   ? Esophageal reflux   ? Glaucoma   ? Hidradenitis   ? History of BPH   ? History of gynecomastia   ? Unilateral  ? Hyperlipidemia   ? Irritable bowel syndrome   ? Memory loss   ? Mitral stenosis   ? mild-moderate MS 01/2016  ? MVP (mitral valve prolapse)   ? Obesity, unspecified   ? Pneumonia   ? Sleep apnea   ? does not use  cpap or bipap .. no study  ever  ? Type II or unspecified type diabetes mellitus without mention of complication, not stated as  uncontrolled   ? Unspecified venous (peripheral) insufficiency   ?  ? ?Home Medications ?Prior to Admission medications   ?Medication Sig Start Date End Date Taking? Authorizing Provider  ?cyclobenzaprine (FLEXERIL) 10 MG tablet Take 1 tablet (10 mg total) by mouth 2 (two) times daily as needed for muscle spasms. 02/20/22  Yes Jason Morgan, MD  ?lidocaine (LIDODERM) 5 % Place 1 patch onto the skin daily. Remove & Discard patch within 12 hours or as directed by MD 02/20/22  Yes Jason Morgan, MD  ?B-D UF III MINI PEN NEEDLES 31G X 5 MM MISC SMARTSIG:SUB-Q Morning-Night 07/10/21   [provider]  ?Blood Glucose Calibration (ACCU-CHEK AVIVA) SOLN 1 Act by In Vitro route 3 (three) times daily. 05/25/21   Jason Lima, MD  ?blood glucose meter kit and supplies KIT Use to check blood sugar daily. DX: E11. 01/10/20   Jason Koch, MD  ?Blood Glucose Monitoring Suppl (ACCU-CHEK AVIVA PLUS) w/Device KIT 1 Act by Does not apply route 3 (three) times daily. 05/25/21   Jason Lima, MD  ?Continuous Blood Gluc Receiver (FREESTYLE LIBRE 2 READER) DEVI 1 Act by Does not apply route daily. 05/07/21   Jason Lima, MD  ?  Continuous Blood Gluc Sensor (FREESTYLE LIBRE 2 SENSOR) MISC 1 Act by Does not apply route daily. 05/07/21   Jason Lima, MD  ?Glucagon (GVOKE HYPOPEN 2-PACK) 1 MG/0.2ML SOAJ Inject 1 Act into the skin daily as needed. 11/10/21   Jason Lima, MD  ?glucose blood (ACCU-CHEK AVIVA PLUS) test strip 1 each by Other route 3 (three) times daily. Use as instructed 05/25/21   Jason Lima, MD  ?insulin glargine, 2 Unit Dial, (TOUJEO MAX SOLOSTAR) 300 UNIT/ML Solostar Pen Inject 30 Units into the skin daily. ?Patient not taking: Reported on 01/27/2022 11/10/21   Jason Lima, MD  ?Insulin Pen Needle 32G X 6 MM MISC 1 Act by Does not apply route in the morning and at bedtime. ?Patient not taking: Reported on 01/27/2022 11/10/21   Jason Lima, MD  ?metFORMIN (GLUCOPHAGE) 500 MG tablet Take  2 tablets (1,000 mg total) by mouth 2 (two) times daily with a meal. 09/30/21   Jason Lima, MD  ?Multiple Vitamins-Minerals (MULTIVITAMIN WITH MINERALS) tablet Take 1 tablet by mouth daily.    [provider]  ?rivaroxaban (XARELTO) 20 MG TABS tablet Take 1 tablet (20 mg total) by mouth daily with supper. ?Patient not taking: Reported on 01/27/2022 08/03/21   Jason Breeding, MD  ?Semaglutide,0.25 or 0.5MG/DOS, (OZEMPIC, 0.25 OR 0.5 MG/DOSE,) 2 MG/1.5ML SOPN Inject 1 mg into the skin once a week. ?Patient not taking: Reported on 01/27/2022 01/28/21   Jason Lima, MD  ?silver sulfADIAZINE (SILVADENE) 1 % cream Apply pea-sized amount to wound daily. 12/04/21   Jason [name], DPM  ?simvastatin (ZOCOR) 40 MG tablet Take 1 tablet (40 mg total) by mouth daily at 6 PM. ?Patient not taking: Reported on 01/27/2022 01/28/21   Jason Lima, MD  ?triamcinolone cream (KENALOG) 0.1 % Apply 1 application topically 2 (two) times daily. 06/16/21 06/16/22  Jason Borg, MD  ?TRUEplus Safety Lancets 28G MISC 1 Act by Does not apply route in the morning and at bedtime. 01/28/21   Jason Lima, MD  ?Wound Dressings (MEDIHONEY WOUND/BURN DRESSING) GEL Apply to affected are 3 times a week, and cover with sterile dressing. 01/04/20   Jason Lima, MD  ?   ? ?Allergies    ?Bactrim [sulfamethoxazole-trimethoprim]   ? ?Review of Systems   ?Review of Systems ? ?Physical Exam ?Updated Vital Signs ?BP 119/70 (BP Location: Right Arm)   Pulse 68   Temp 98.6 ?F (37 ?C) (Oral)   Resp 18   SpO2 100%  ?Physical Exam ?Vitals and nursing note reviewed.  ?Constitutional:   ?   General: He is not in acute distress. ?   Appearance: Normal appearance. He is not ill-appearing, toxic-appearing or diaphoretic.  ?HENT:  ?   Head: Normocephalic.  ?Eyes:  ?   Conjunctiva/sclera: Conjunctivae normal.  ?Cardiovascular:  ?   Rate and Rhythm: Normal rate and regular rhythm.  ?   Pulses: Normal pulses.  ?Pulmonary:  ?   Effort: Pulmonary effort  is normal. No respiratory distress.  ?Abdominal:  ?   Tenderness: There is no abdominal tenderness.  ?Musculoskeletal:     ?   General: Tenderness (lower back, sacrum) present. No deformity or signs of injury.  ?   Cervical back: No rigidity.  ?Skin: ?   General: Skin is warm and dry.  ?   Coloration: Skin is not jaundiced or pale.  ?Neurological:  ?   General: No focal deficit present.  ?  Mental Status: He is alert and oriented to person, place, and time.  ?   Motor: No weakness (has pain with movement but 5/5 strength hip flexion, extension, knee flexion/extension, dorsi/plantar/great toe extension).  ? ? ?ED Results / Procedures / Treatments   ?Labs ?(all labs ordered are listed, but only abnormal results are displayed) ?Labs Reviewed - No data to display ? ?EKG ?None ? ?Radiology ?DG Lumbar Spine Complete ? ?Result Date: 02/20/2022 ?CLINICAL DATA:  Low back pain after fall from stool. EXAM: LUMBAR SPINE - COMPLETE 4+ VIEW COMPARISON:  None. FINDINGS: Four view study. Bones are diffusely demineralized. Mild superior endplate depression at Y3-O8 is age indeterminate. No definite acute lumbar spine fracture. Loss of disc height noted L4-5 and L5-S1 facets are well aligned bilaterally. SI joints are unremarkable. IMPRESSION: 1. Mild superior endplate depression at L1 and L2 is age indeterminate. 2. Degenerative disc disease at L4-5 and L5-S1. Electronically Signed   By: [name] M.D.   On: 02/20/2022 12:53  ? ?CT Lumbar Spine Wo Contrast ? ?Result Date: 02/20/2022 ?CLINICAL DATA:  [place] from a stool 4 days ago.  Back pain. EXAM: CT LUMBAR SPINE WITHOUT CONTRAST CT PELVIS WITHOUT CONTRAST TECHNIQUE: Multidetector CT imaging of the lumbar spine was performed without intravenous contrast administration. Multiplanar CT image reconstructions were also generated. RADIATION DOSE REDUCTION: This exam was performed according to the departmental dose-optimization program which includes automated exposure control, adjustment  of the mA and/or kV according to patient size and/or use of iterative reconstruction technique. COMPARISON:  Current lumbar spine radiographs. CT abdomen and pelvis without contrast, 01/20/2017. FINDINGS: CT LUMB

## 2022-02-20 NOTE — ED Notes (Signed)
Patient transported to X-ray 

## 2022-02-20 NOTE — ED Triage Notes (Signed)
Onset 4 days ago pt fell off his stool at home. He is c/o lower back pain.  ?

## 2022-02-20 NOTE — Discharge Instructions (Addendum)
Recommend tylenol in addition to the lidocaine patch and muscul relaxant. You may take acetaminophen in addition to this.  The maximum amount of acetaminophen is 1000 mg 4 times a day (from all sources-make sure you are not taking other meds with acetaminophen if you are taking this dose)  ?

## 2022-02-20 NOTE — ED Triage Notes (Signed)
Patient complains of ongoing lower back pain after falling while attempting to sit on a stool four days ago. Denies head injury, denies LOC, denies anticoagulants. Patient is alert, oriented, and in no apparent distress at this time. ?

## 2022-02-25 ENCOUNTER — Encounter: Payer: Self-pay | Admitting: Nurse Practitioner

## 2022-02-25 ENCOUNTER — Ambulatory Visit (INDEPENDENT_AMBULATORY_CARE_PROVIDER_SITE_OTHER): Payer: Medicare HMO | Admitting: Nurse Practitioner

## 2022-02-25 VITALS — BP 120/60 | HR 73 | Temp 97.7°F | Ht >= 80 in | Wt 273.0 lb

## 2022-02-25 DIAGNOSIS — W19XXXA Unspecified fall, initial encounter: Secondary | ICD-10-CM | POA: Insufficient documentation

## 2022-02-25 DIAGNOSIS — M545 Low back pain, unspecified: Secondary | ICD-10-CM | POA: Diagnosis not present

## 2022-02-25 DIAGNOSIS — W19XXXS Unspecified fall, sequela: Secondary | ICD-10-CM

## 2022-02-25 NOTE — Progress Notes (Signed)
? ? ? ?Subjective:  ?Patient ID: Jason [name], male    DOB: 1941/05/22  Age: 81 y.o. MRN: [id number] ? ?CC:  ?Chief Complaint  ?Patient presents with  ? Hospitalization Follow-up  ?  ? ? ?HPI  ?This patient arrives today for the above. ? ?He had a fall about 1 week ago.  She was getting ready to sit down on a stool when the stool slipped between his legs and he fell directly on his bottom.  He went to the emergency department for evaluation 2 days after the fall.  At that time CT scan was done of lumbar spine and pelvis.  No fracture was noted.  He was prescribed Flexeril, lidocaine patch, and told to use as needed Tylenol.  He has chronic urinary incontinence and at time of ER presentation reported it was not any worse.  Today, he denies worsening weakness in his lower extremities, sensory changes in his lower extremities, bowel incontinence, he does report mildly worse urinary incontinence, and denies any significant pain to his back or legs.  He is here today for close monitoring.  He is on Xarelto. ? ?Past Medical History:  ?Diagnosis Date  ? Anxiety   ? Asthma   ? in past, no inhaler now  ? Benign neoplasm of colon   ? Bone infection ([place])   ? Cancer [facility])   ? bladder cancer  ? COPD (chronic obstructive pulmonary disease) ([place])   ? Depression   ? Diverticulosis of colon (without mention of hemorrhage)   ? DJD (degenerative joint disease)   ? Esophageal reflux   ? Glaucoma   ? Hidradenitis   ? History of BPH   ? History of gynecomastia   ? Unilateral  ? Hyperlipidemia   ? Irritable bowel syndrome   ? Memory loss   ? Mitral stenosis   ? mild-moderate MS 01/2016  ? MVP (mitral valve prolapse)   ? Obesity, unspecified   ? Pneumonia   ? Sleep apnea   ? does not use  cpap or bipap .. no study  ever  ? Type II or unspecified type diabetes mellitus without mention of complication, not stated as uncontrolled   ? Unspecified venous (peripheral) insufficiency   ? ? ? ? ?Family History  ?Problem Relation Age of Onset  ?  Diabetes Father   ? Cancer Father   ?     Prostate  ? Stroke Father   ? Prostate cancer Father   ? High blood pressure Other   ? Colon cancer Neg Hx   ? Esophageal cancer Neg Hx   ? Rectal cancer Neg Hx   ? Stomach cancer Neg Hx   ? ? ?Social History  ? ?Social History Narrative  ? Not on file  ? ?Social History  ? ?Tobacco Use  ? Smoking status: Former  ?  Packs/day: 0.10  ?  Years: 5.00  ?  Pack years: 0.50  ?  Types: Cigarettes  ?  Quit date: 05/10/2015  ?  Years since quitting: 6.8  ? Smokeless tobacco: Never  ?Substance Use Topics  ? Alcohol use: Not Currently  ?  Alcohol/week: 7.0 standard drinks  ?  Types: 7 Cans of beer per week  ?  Comment: social can of beer NONE RECENTLY  ? ? ? ?No outpatient medications have been marked as taking for the 02/25/22 encounter (Office Visit) with Ailene Ards, NP.  ? ? ?ROS:  ?Review of Systems  ?Genitourinary:   ?     (+)  chronic urinary incontinence - slightly worse  ?(-) bowel incontinence  ?Neurological:  Positive for weakness. Negative for tingling and sensory change.  ? ? ?Objective:  ? ?Today's Vitals: BP 120/60   Pulse 73   Temp 97.7 ?F (36.5 ?C) (Oral)   Ht '6\' 8"'$  (2.032 m)   Wt 273 lb (123.8 kg)   SpO2 99%   BMI 29.99 kg/m?  ? ?  02/25/2022  ? 11:33 AM 02/20/2022  ?  4:09 PM 02/20/2022  ?  1:00 PM  ?Vitals with BMI  ?Height '6\' 8"'$     ?Weight 273 lbs    ?BMI 29.99    ?Systolic 573 220 254  ?Diastolic 60 70 72  ?Pulse 73 68 65  ?  ? ?Physical Exam ?Vitals reviewed.  ?Constitutional:   ?   Appearance: Normal appearance.  ?HENT:  ?   Head: Normocephalic and atraumatic.  ?Cardiovascular:  ?   Rate and Rhythm: Normal rate and regular rhythm.  ?Pulmonary:  ?   Effort: Pulmonary effort is normal.  ?   Breath sounds: Normal breath sounds.  ?Musculoskeletal:  ?   Cervical back: Normal and neck supple.  ?   Thoracic back: Normal.  ?   Lumbar back: Swelling and tenderness (mild) present. No lacerations, spasms or bony tenderness. Negative right straight leg raise test and  negative left straight leg raise test.  ?Skin: ?   General: Skin is warm and dry.  ? ?    ?Neurological:  ?   Mental Status: He is alert and oriented to person, place, and time.  ?   Sensory: Sensation is intact.  ?   Motor: Weakness (mild chronic, at baseline per pt; bilateral 4/5) present.  ?   Gait: Gait is intact.  ?   Comments: Uses walker,   ?Psychiatric:     ?   Mood and Affect: Mood normal.     ?   Behavior: Behavior normal.     ?   Thought Content: Thought content normal.     ?   Judgment: Judgment normal.  ? ? ? ? ? ? ? ?Assessment and Plan  ? ?1. Fall, sequela   ? ? ? ?Plan: ?See plan via problem list below. ? ? ?Tests ordered ?No orders of the defined types were placed in this encounter. ? ? ? ? ?No orders of the defined types were placed in this encounter. ? ? ?Patient to follow-up in 1 to 3 months with primary care provider for routine follow-up, or sooner as needed. ? ?Ailene Ards, NP ? ?

## 2022-02-25 NOTE — Assessment & Plan Note (Signed)
Neurologic exam appears to be at baseline.  Reassurance provided to patient.  Consultation with supervising physician completed today, no obvious reason to be admitted to patient at this time.  Patient was told if he experiences sensory changes to his lower extremities, weakness to his lower extremities, worsening bowel or bladder incontinence, or worsening pain to notify us promptly or proceed to the emergency department.  He reports his understanding. ?

## 2022-02-26 ENCOUNTER — Telehealth: Payer: Self-pay

## 2022-02-26 NOTE — Progress Notes (Signed)
? ? ?Chronic Care Management ?Pharmacy Assistant  ? ?Name: Jason [name]  MRN: [id number] DOB: 02/03/1941 ? ?Reason for Encounter: Disease StateAdherence Call ?  ?Appointment 05/03/22 @1pm with clinical pharmacist ? ?Recent office visits:  ?None ID ? ?Recent consult visits:  ?02/25/22 [name], [name], NP (Fall sequela) No orders or med changes ? ?Hospital visits:  ?Medication Reconciliation was completed by comparing discharge summary, patient?s EMR and Pharmacy list, and upon discussion with patient. ? ?Admitted to the hospital on 02/20/22 due to low back pain. Discharge date was 02/20/22. [facility]  ? ?New?Medications Started at Hospital Discharge:?? ?-started cyclobenzaprine 10 mg and lidocaine 5% 1 patch  ? ?Medications that remain the same after Hospital Discharge:??  ?-All other medications will remain the same.  ?  ?Admitted to the hospital on 02/20/22 due to low back pain. Discharge date was 02/20/22. Discharged from [facility] [place] ?.  ?Medications: ?Outpatient Encounter Medications as of 02/26/2022  ?Medication Sig  ? B-D UF III MINI PEN NEEDLES 31G X 5 MM MISC SMARTSIG:SUB-Q Morning-Night  ? Blood Glucose Calibration (ACCU-CHEK AVIVA) SOLN 1 Act by In Vitro route 3 (three) times daily.  ? blood glucose meter kit and supplies KIT Use to check blood sugar daily. DX: E11.  ? Blood Glucose Monitoring Suppl (ACCU-CHEK AVIVA PLUS) w/Device KIT 1 Act by Does not apply route 3 (three) times daily.  ? Continuous Blood Gluc Receiver (FREESTYLE LIBRE 2 READER) DEVI 1 Act by Does not apply route daily.  ? Continuous Blood Gluc Sensor (FREESTYLE LIBRE 2 SENSOR) MISC 1 Act by Does not apply route daily.  ? cyclobenzaprine (FLEXERIL) 10 MG tablet Take 1 tablet (10 mg total) by mouth 2 (two) times daily as needed for muscle spasms.  ? Glucagon (GVOKE HYPOPEN 2-PACK) 1 MG/0.2ML SOAJ Inject 1 Act into the skin daily as needed.  ? glucose blood (ACCU-CHEK AVIVA PLUS) test  strip 1 each by Other route 3 (three) times daily. Use as instructed  ? insulin glargine, 2 Unit Dial, (TOUJEO MAX SOLOSTAR) 300 UNIT/ML Solostar Pen Inject 30 Units into the skin daily. (Patient not taking: Reported on 01/27/2022)  ? Insulin Pen Needle 32G X 6 MM MISC 1 Act by Does not apply route in the morning and at bedtime. (Patient not taking: Reported on 01/27/2022)  ? lidocaine (LIDODERM) 5 % Place 1 patch onto the skin daily. Remove & Discard patch within 12 hours or as directed by MD  ? metFORMIN (GLUCOPHAGE) 500 MG tablet Take 2 tablets (1,000 mg total) by mouth 2 (two) times daily with a meal.  ? Multiple Vitamins-Minerals (MULTIVITAMIN WITH MINERALS) tablet Take 1 tablet by mouth daily.  ? rivaroxaban (XARELTO) 20 MG TABS tablet Take 1 tablet (20 mg total) by mouth daily with supper. (Patient not taking: Reported on 01/27/2022)  ? Semaglutide,0.25 or 0.5MG/DOS, (OZEMPIC, 0.25 OR 0.5 MG/DOSE,) 2 MG/1.5ML SOPN Inject 1 mg into the skin once a week. (Patient not taking: Reported on 01/27/2022)  ? silver sulfADIAZINE (SILVADENE) 1 % cream Apply pea-sized amount to wound daily.  ? simvastatin (ZOCOR) 40 MG tablet Take 1 tablet (40 mg total) by mouth daily at 6 PM. (Patient not taking: Reported on 01/27/2022)  ? triamcinolone cream (KENALOG) 0.1 % Apply 1 application topically 2 (two) times daily.  ? TRUEplus Safety Lancets 28G MISC 1 Act by Does not apply route in the morning and at bedtime.  ? Wound Dressings (MEDIHONEY WOUND/BURN DRESSING) GEL   Apply to affected are 3 times a week, and cover with sterile dressing.  ? ?No facility-administered encounter medications on file as of 02/26/2022.  ? ?Have you had any problems recently with your health? Patient stated that he sometimes feels tired and sluggish during the day. He also stated that he went to the hospital about a week ago because he slide out the chair and could not get up. He states that he hurt his back and now uses a walker to get around. ? ?Have you had  any problems with your pharmacy? Patient states that he does not have any issues with getting his medications from the pharmacy ? ?What issues or side effects are you having with your medications?Patient states that he he feels tired all the time when he takes his medications not sure which ones make him feel tired ? ?What would you like me to pass along to [facility] for them to help you with? Patient states that he is not really sure what medications he is taking. He states that he has not taken simvastatin or ozempic in a while. He states that he would like help with when and how he should be taking his medications because he gets so sleepy during the day and has a hard time getting his day started. ? ?What can we do to take care of you better? Patient states that if he can get his medications straightened out that would help a lot. ? ?Care Gaps: ?Colonoscopy-06/10/15 ?Diabetic Foot Exam-05/21/20 ?Ophthalmology-04/29/21 ?Dexa Scan NA-  ?Annual Well Visit -  ?Micro albumin-05/21/20 ?Hemoglobin A1c- 05/07/21 ? ?Star Rating Drugs: ?Metformin 500 mg-last fill 10/06/21 ?Simvastatin 40 mg-last fill 01/28/21 ? ?Jason [name] ?Clinical Pharmacist Assistant ?[phone]  ?

## 2022-03-02 ENCOUNTER — Telehealth: Payer: Self-pay | Admitting: Internal Medicine

## 2022-03-02 NOTE — Telephone Encounter (Signed)
Needs verbal order for plan of care - Physical therapy 2 x 4 weeks ?1 time for 2 weeks ? ?Is going to add nursing evaluation ? ?Please advise ?

## 2022-03-02 NOTE — Telephone Encounter (Signed)
Verbal orders given to proceed with PT.  ?

## 2022-03-03 ENCOUNTER — Telehealth: Payer: Self-pay

## 2022-03-03 ENCOUNTER — Ambulatory Visit (INDEPENDENT_AMBULATORY_CARE_PROVIDER_SITE_OTHER): Payer: Medicare HMO

## 2022-03-03 DIAGNOSIS — E1151 Type 2 diabetes mellitus with diabetic peripheral angiopathy without gangrene: Secondary | ICD-10-CM

## 2022-03-03 DIAGNOSIS — E785 Hyperlipidemia, unspecified: Secondary | ICD-10-CM

## 2022-03-03 DIAGNOSIS — I4892 Unspecified atrial flutter: Secondary | ICD-10-CM

## 2022-03-03 NOTE — Telephone Encounter (Signed)
Samples left for patient  ? ?Xarelto '20mg'$   ?Lot#: [id number] ?Exp: 03/14/2024 ?#14 tablets  ? ?Patient to complete Patient assistance forms while picking up samples as well  ? ?Tomasa Blase, PharmD ?Clinical Pharmacist, [place]  ? ? ?

## 2022-03-03 NOTE — Progress Notes (Signed)
? ?Chronic Care Management ?Pharmacy Note ? ?03/03/2022 ?Name:  Jason [name] MRN:  [id number] DOB:  10-Mar-1941 ? ?Summary: ?-Patient reports that he recently was accepted into [facility] home health services yesterday ?-Reports that he has a nurse visit that is scheduled for tomorrow  ?-Has not completed patient assistance forms for xarelto, ozempic, and toujeo that have been placed at front desk ?-only medications that he is taking regularly at this time are metformin and xarelto (samples) ?-Reports he has checked his BG since last visit - notes it was 168 today  ? ?Recommendations/Changes made from today's visit: ?-Reviewed with patient importance of compliance to medications - reviewed purpose of medications in maintaining control of chronic disease states  ?-Patient assistance applications for ozemic, toujeo, and xarelto are waiting for his signature out front - will be sent in once signed ?-Patient to start checking BG daily and record in logbook to discuss with next visit  ?-Advised for patient to review medications / BG monitoring with home health nurse when they visit as he could not locate all of his medications when on the phone today ?-Encouraged for patient to use weekly pill organizer for his medications to help improve his medication compliance / ask if home health could help fill these pill boxes  ? ? ?Subjective: ?Jason [name] is an 81 y.o. year old male who is a primary patient of [name], MD.  The CCM team was consulted for assistance with disease management and care coordination needs.   ? ?Engaged with patient by telephone for follow up visit in response to provider referral for pharmacy case management and/or care coordination services.  ? ?Consent to Services:  ?The patient was given information about Chronic Care Management services, agreed to services, and gave verbal consent prior to initiation of services.  Please see initial visit note for detailed documentation.  ? ?Patient Care  Team: ?[name], MD as PCP - General (Internal Medicine) ?Minus Breeding, MD as PCP - Cardiology (Cardiology) ?Tomasa Blase, [facility] (Pharmacist) ? ?Recent office visits: ?02/25/2022 - [facility] follow up - no changes to medications - f/u in 1-3 months  ? ?Recent consult visits: ?02/20/2022 ? ?Hospital visits: ?02/20/2022 - ED visit - back pain - fall 2 days prior - rx'd cyclobenzaprine and lidocaine patches  ? ?Objective: ? ?Lab Results  ?Component Value Date  ? CREATININE 0.98 08/03/2021  ? BUN 10 08/03/2021  ? GFR 57.09 (L) 05/07/2021  ? GFRNONAA >60 05/17/2019  ? GFRAA >60 05/17/2019  ? NA 140 08/03/2021  ? K 4.7 08/03/2021  ? CALCIUM 9.6 08/03/2021  ? CO2 26 08/03/2021  ? GLUCOSE 162 (H) 08/03/2021  ? ? ?Lab Results  ?Component Value Date/Time  ? HGBA1C 7.5 (A) 08/18/2021 02:40 PM  ? HGBA1C 7.5 08/18/2021 02:40 PM  ? HGBA1C 7.5 (A) 08/18/2021 02:40 PM  ? HGBA1C 7.5 (A) 08/18/2021 02:40 PM  ? HGBA1C 9.3 (H) 05/07/2021 01:54 PM  ? HGBA1C 8.2 (H) 01/06/2021 11:51 AM  ? GFR 57.09 (L) 05/07/2021 01:54 PM  ? GFR 76.50 01/06/2021 11:51 AM  ? MICROALBUR 2.1 05/21/2020 02:58 PM  ? MICROALBUR 11.3 (H) 04/18/2019 10:43 AM  ?  ?Last diabetic Eye exam:  ?Lab Results  ?Component Value Date/Time  ? HMDIABEYEEXA No Retinopathy 04/29/2021 12:00 AM  ?  ?Last diabetic Foot exam:  ?Lab Results  ?Component Value Date/Time  ? HMDIABFOOTEX done 08/09/2014 12:00 AM  ?  ? ?Lab Results  ?Component Value Date  ?  CHOL 147 05/21/2020  ? HDL 49 05/21/2020  ? [employer] [facility] 79 05/21/2020  ? TRIG 100 05/21/2020  ? CHOLHDL 3.0 05/21/2020  ? ? ? ?  Latest Ref Rng & Units 05/21/2020  ?  2:58 PM 05/17/2019  ?  6:29 AM 04/18/2019  ? 10:35 AM  ?Hepatic Function  ?Total Protein 6.1 - 8.1 g/dL 6.7   6.5   7.1    ?Albumin 3.5 - 5.0 g/dL  3.6   4.1    ?AST 10 - 35 U/L _0 ?ALT 9 - 46 U/L _1 ?Alk Phosphatase 38 - 126 U/L  32   43    ?Total Bilirubin 0.2 - 1.2 mg/dL 0.6   1.5   0.9    ?Bilirubin, Direct 0.0 - 0.2 mg/dL 0.2       ? ? ?Lab Results  ?Component Value Date/Time  ? TSH 0.78 05/07/2021 01:54 PM  ? TSH 0.85 04/18/2019 10:35 AM  ? ? ? ?  Latest Ref Rng & Units 08/03/2021  ? 10:42 AM 09/30/2020  ? 12:10 PM 06/30/2020  ? 11:52 AM  ?CBC  ?WBC 3.4 - 10.8 x10E3/uL 4.6   4.1   4.6    ?Hemoglobin 13.0 - 17.7 g/dL 12.1   13.1   12.6    ?Hematocrit 37.5 - 51.0 % 35.5   39.1   38.3    ?Platelets 150 - 450 x10E3/uL 176   165.0   159    ? ? ?No results found for: VD25OH ? ?Clinical ASCVD: Yes  - PAD ?The 10-year ASCVD risk score (Arnett DK, et al., 2019) is: 36.5% ?  Values used to calculate the score: ?    Age: 46 years ?    Sex: Male ?    Is Non-Hispanic African American: Yes ?    Diabetic: Yes ?    Tobacco smoker: Yes ?    Systolic Blood Pressure: 834 mmHg ?    Is BP treated: No ?    HDL Cholesterol: 49 mg/dL ?    Total Cholesterol: 147 mg/dL   ? ? ?  05/13/2021  ? 11:30 AM 10/20/2020  ?  9:29 AM 12/31/2019  ?  1:56 PM  ?Depression screen PHQ 2/9  ?Decreased Interest 0 1 2  ?Down, Depressed, Hopeless 1 2 0  ?PHQ - 2 Score _2 ?Altered sleeping   1  ?Tired, decreased energy   1  ?Change in appetite   1  ?Feeling bad or failure about yourself    0  ?Trouble concentrating   1  ?Moving slowly or fidgety/restless   1  ?Suicidal thoughts   0  ?PHQ-9 Score   7  ?Difficult doing work/chores   Not difficult at all  ? ? ?Social History  ? ?Tobacco Use  ?Smoking Status Former  ? Packs/day: 0.10  ? Years: 5.00  ? Pack years: 0.50  ? Types: Cigarettes  ? Quit date: 05/10/2015  ? Years since quitting: 6.8  ?Smokeless Tobacco Never  ? ?BP Readings from Last 3 Encounters:  ?02/25/22 120/60  ?02/20/22 119/70  ?02/20/22 (!) 114/59  ? ?Pulse Readings from Last 3 Encounters:  ?02/25/22 73  ?02/20/22 68  ?02/20/22 69  ? ?Wt Readings from Last 3 Encounters:  ?02/25/22 273 lb (123.8 kg)  ?08/18/21 289 lb (131.1 kg)  ?08/12/21 296 lb (134.3 kg)  ? ?BMI Readings from Last 3 Encounters:  ?  02/25/22 29.99 kg/m?  ?08/18/21 31.75 kg/m?  ?08/12/21 32.52 kg/m?   ? ? ?Assessment/Interventions: Review of patient past medical history, allergies, medications, health status, including review of consultants reports, laboratory and other test data, was performed as part of comprehensive evaluation and provision of chronic care management services.  ? ?SDOH:  (Social Determinants of Health) assessments and interventions performed: Yes ? ? ?SDOH Screenings  ? ?Alcohol Screen: Not on file  ?Depression (PHQ2-9): Low Risk   ? PHQ-2 Score: 1  ?Financial Resource Strain: Medium Risk  ? Difficulty of Paying Living Expenses: Somewhat hard  ?Food Insecurity: Food Insecurity Present  ? Worried About Charity fundraiser in the Last Year: Sometimes true  ? Ran Out of Food in the Last Year: Sometimes true  ?Housing: Medium Risk  ? Last Housing Risk Score: 1  ?Physical Activity: Not on file  ?Social Connections: Not on file  ?Stress: Not on file  ?Tobacco Use: Medium Risk  ? Smoking Tobacco Use: Former  ? Smokeless Tobacco Use: Never  ? Passive Exposure: Not on file  ?Transportation Needs: No Transportation Needs  ? Lack of Transportation (Medical): No  ? Lack of Transportation (Non-Medical): No  ? ? ?CCM Care Plan ? ?Allergies  ?Allergen Reactions  ? Bactrim [Sulfamethoxazole-Trimethoprim] Rash  ? ? ?Medications Reviewed Today   ? ? Reviewed by Tomasa Blase, [facility] (Pharmacist) on 03/03/22 at 1441  Med List Status: <None>  ? ?Medication Order Taking? Sig Documenting Provider Last Dose Status Informant  ?B-D UF III MINI PEN NEEDLES 31G X 5 MM MISC [id number] No SMARTSIG:SUB-Q Morning-Night  ?Patient not taking: Reported on 03/03/2022  ? [provider] Not Taking Active   ?Blood Glucose Calibration (ACCU-CHEK AVIVA) SOLN [id number] Yes 1 Act by In Vitro route 3 (three) times daily. [name], MD Taking Active Self  ?blood glucose meter kit and supplies KIT [id number] Yes Use to check blood sugar daily. DX: E11. Hoyt Koch, MD Taking Active Self  ?Blood Glucose Monitoring  Suppl (ACCU-CHEK AVIVA PLUS) w/Device KIT [id number] Yes 1 Act by Does not apply route 3 (three) times daily. [name], MD Taking Active Self  ?Continuous Blood Gluc Receiver (FREESTYLE LIBRE 2 READER) DEVI

## 2022-03-03 NOTE — Progress Notes (Signed)
Following up on the below message, I call Jason [name] back today, and it appears to me that he is a bit confused on taking his medications as prescribed. Patient informed me that he wold stop by the office to sign his PAP forms, I will follow up to see if he indeed did so. Patient expressed to me that he would like assistance with a in house nurse, or home aid, as he feels he is not taking his med as he should. Patient also expressed that while he does take his BS, but not like he should. I did schedule him a follow up appt, but that is not until June. He is a high CCM patient, I will be calling him 1st thing in May, please adviseif need be. ?

## 2022-03-03 NOTE — Patient Instructions (Signed)
Visit Information ? ?Following are the goals we discussed today:  ? ?Manage My Medicine  ? ?Timeframe:  Long-Range Goal ?Priority:  High ?Start Date:      02/05/21                       ?Expected End Date:  01/28/2023                ? ?Follow Up Date June 2023 ?  ?- call for medicine refill 2 or 3 days before it runs out ?- call if I am sick and can't take my medicine ?- keep a list of all the medicines I take; vitamins and herbals too ?- use a pillbox to sort medicine  ?- Let the doctors know when you cannot afford or you do not know how to take a medication ?  ?Why is this important?   ?These steps will help you keep on track with your medicines. ? ?Plan: Telephone follow up appointment with care management team member scheduled for:  2 months ?The patient has been provided with contact information for the care management team and has been advised to call with any health related questions or concerns.  ? ?Tomasa Blase, PharmD ?Clinical Pharmacist, [place]  ? ?Please call the care guide team at [phone] if you need to cancel or reschedule your appointment.  ? ?The patient verbalized understanding of instructions, educational materials, and care plan provided today and declined offer to receive copy of patient instructions, educational materials, and care plan.  ? ?

## 2022-03-03 NOTE — Telephone Encounter (Signed)
PTM opened in error, please close ?

## 2022-03-04 NOTE — Telephone Encounter (Signed)
Jim PT from [facility] states that the pt would benefit from OT for ADLs also HHA for bathing and dressing. ? ?Frequency  ?2 times a week for 2 weeks HHA ? ?Requesting a Referral for Podiatrist. ? ?CB [phone] ?

## 2022-03-05 ENCOUNTER — Other Ambulatory Visit: Payer: Self-pay | Admitting: Internal Medicine

## 2022-03-05 ENCOUNTER — Telehealth: Payer: Self-pay | Admitting: Internal Medicine

## 2022-03-05 NOTE — Telephone Encounter (Signed)
PT and spouse visit today in to fill out paperwork and provide proof of income! PT stated that she had one more form for Dan and would either bring it in herself or mail it to [place]! ?

## 2022-03-09 DIAGNOSIS — Z89211 Acquired absence of right upper limb below elbow: Secondary | ICD-10-CM

## 2022-03-09 DIAGNOSIS — F419 Anxiety disorder, unspecified: Secondary | ICD-10-CM

## 2022-03-09 DIAGNOSIS — I4892 Unspecified atrial flutter: Secondary | ICD-10-CM | POA: Diagnosis not present

## 2022-03-09 DIAGNOSIS — K589 Irritable bowel syndrome without diarrhea: Secondary | ICD-10-CM

## 2022-03-09 DIAGNOSIS — I341 Nonrheumatic mitral (valve) prolapse: Secondary | ICD-10-CM | POA: Diagnosis not present

## 2022-03-09 DIAGNOSIS — Z85038 Personal history of other malignant neoplasm of large intestine: Secondary | ICD-10-CM

## 2022-03-09 DIAGNOSIS — E519 Thiamine deficiency, unspecified: Secondary | ICD-10-CM

## 2022-03-09 DIAGNOSIS — I70209 Unspecified atherosclerosis of native arteries of extremities, unspecified extremity: Secondary | ICD-10-CM | POA: Diagnosis not present

## 2022-03-09 DIAGNOSIS — G4733 Obstructive sleep apnea (adult) (pediatric): Secondary | ICD-10-CM

## 2022-03-09 DIAGNOSIS — M461 Sacroiliitis, not elsewhere classified: Secondary | ICD-10-CM

## 2022-03-09 DIAGNOSIS — N39498 Other specified urinary incontinence: Secondary | ICD-10-CM

## 2022-03-09 DIAGNOSIS — Z87891 Personal history of nicotine dependence: Secondary | ICD-10-CM

## 2022-03-09 DIAGNOSIS — Z8551 Personal history of malignant neoplasm of bladder: Secondary | ICD-10-CM

## 2022-03-09 DIAGNOSIS — Z7984 Long term (current) use of oral hypoglycemic drugs: Secondary | ICD-10-CM

## 2022-03-09 DIAGNOSIS — Z8701 Personal history of pneumonia (recurrent): Secondary | ICD-10-CM

## 2022-03-09 DIAGNOSIS — M5117 Intervertebral disc disorders with radiculopathy, lumbosacral region: Secondary | ICD-10-CM | POA: Diagnosis not present

## 2022-03-09 DIAGNOSIS — M5116 Intervertebral disc disorders with radiculopathy, lumbar region: Secondary | ICD-10-CM | POA: Diagnosis not present

## 2022-03-09 DIAGNOSIS — E1151 Type 2 diabetes mellitus with diabetic peripheral angiopathy without gangrene: Secondary | ICD-10-CM | POA: Diagnosis not present

## 2022-03-09 DIAGNOSIS — I340 Nonrheumatic mitral (valve) insufficiency: Secondary | ICD-10-CM | POA: Diagnosis not present

## 2022-03-09 DIAGNOSIS — I872 Venous insufficiency (chronic) (peripheral): Secondary | ICD-10-CM | POA: Diagnosis not present

## 2022-03-09 DIAGNOSIS — Z89411 Acquired absence of right great toe: Secondary | ICD-10-CM

## 2022-03-09 DIAGNOSIS — J449 Chronic obstructive pulmonary disease, unspecified: Secondary | ICD-10-CM | POA: Diagnosis not present

## 2022-03-09 DIAGNOSIS — H409 Unspecified glaucoma: Secondary | ICD-10-CM

## 2022-03-09 DIAGNOSIS — I700 Atherosclerosis of aorta: Secondary | ICD-10-CM

## 2022-03-09 DIAGNOSIS — N62 Hypertrophy of breast: Secondary | ICD-10-CM

## 2022-03-09 DIAGNOSIS — Z7901 Long term (current) use of anticoagulants: Secondary | ICD-10-CM

## 2022-03-09 DIAGNOSIS — F32A Depression, unspecified: Secondary | ICD-10-CM

## 2022-03-09 DIAGNOSIS — E785 Hyperlipidemia, unspecified: Secondary | ICD-10-CM

## 2022-03-09 DIAGNOSIS — Z9181 History of falling: Secondary | ICD-10-CM

## 2022-03-09 DIAGNOSIS — K219 Gastro-esophageal reflux disease without esophagitis: Secondary | ICD-10-CM

## 2022-03-09 DIAGNOSIS — M160 Bilateral primary osteoarthritis of hip: Secondary | ICD-10-CM

## 2022-03-09 DIAGNOSIS — N401 Enlarged prostate with lower urinary tract symptoms: Secondary | ICD-10-CM

## 2022-03-09 DIAGNOSIS — Z794 Long term (current) use of insulin: Secondary | ICD-10-CM

## 2022-03-09 DIAGNOSIS — N200 Calculus of kidney: Secondary | ICD-10-CM

## 2022-03-09 NOTE — Telephone Encounter (Signed)
Patient assistance applications for Xarelto, Toujeo, and Ozempic sent to manufacturers  ? ?Tomasa Blase, PharmD ?Clinical Pharmacist, [place]  ? ? ? ? ?

## 2022-03-10 ENCOUNTER — Telehealth: Payer: Self-pay

## 2022-03-10 ENCOUNTER — Other Ambulatory Visit: Payer: Self-pay | Admitting: Internal Medicine

## 2022-03-10 DIAGNOSIS — E1151 Type 2 diabetes mellitus with diabetic peripheral angiopathy without gangrene: Secondary | ICD-10-CM

## 2022-03-10 DIAGNOSIS — E118 Type 2 diabetes mellitus with unspecified complications: Secondary | ICD-10-CM

## 2022-03-10 DIAGNOSIS — E785 Hyperlipidemia, unspecified: Secondary | ICD-10-CM

## 2022-03-10 DIAGNOSIS — I4892 Unspecified atrial flutter: Secondary | ICD-10-CM

## 2022-03-10 DIAGNOSIS — E119 Type 2 diabetes mellitus without complications: Secondary | ICD-10-CM

## 2022-03-10 DIAGNOSIS — I739 Peripheral vascular disease, unspecified: Secondary | ICD-10-CM

## 2022-03-10 MED ORDER — SIMVASTATIN 40 MG PO TABS: 40.0000 mg | ORAL_TABLET | Freq: Every day | ORAL | 1 refills | Status: DC

## 2022-03-10 MED ORDER — INSULIN PEN NEEDLE 32G X 6 MM MISC: 1.0000 | Freq: Two times a day (BID) | 0 refills | Status: DC

## 2022-03-10 MED ORDER — FREESTYLE LIBRE 2 READER DEVI: 1.0000 | Freq: Every day | 5 refills | Status: DC

## 2022-03-10 MED ORDER — RIVAROXABAN 20 MG PO TABS: 20.0000 mg | ORAL_TABLET | Freq: Every day | ORAL | 0 refills | Status: DC

## 2022-03-10 MED ORDER — FREESTYLE LIBRE 2 SENSOR MISC: 1.0000 | Freq: Every day | 5 refills | Status: DC

## 2022-03-10 NOTE — Telephone Encounter (Signed)
Jason [name] is calling to see what medications he should be taking. ? ?Pt is in need of refills on: ?rivaroxaban (XARELTO) 20 MG TABS tablet ?Semaglutide,0.25 or 0.'5MG'$ /DOS, (OZEMPIC, 0.25 OR 0.5 MG/DOSE,) 2 MG/1.5ML SOPN ?Continuous Blood Gluc Receiver (FREESTYLE LIBRE 2 READER) DEVI ?Continuous Blood Gluc Sensor (FREESTYLE LIBRE 2 SENSOR) MISC ?simvastatin (ZOCOR) 40 MG tablet ?Insulin Pen Needle 32G X 6 MM MISC ? ?Pharmacy: ?[place], [place] ? ?LOV 05/07/21 ?ROV 03/16/22 ?

## 2022-03-11 ENCOUNTER — Telehealth: Payer: Self-pay

## 2022-03-11 NOTE — Progress Notes (Signed)
? ? ? ?  Chronic Care Management ?Pharmacy Assistant  ? ?Name: Jason [name]  MRN: [id number] DOB: [birth year] ? ?Contacted [employer]  to follow up on patient assistance application for [employer]. Per representative at [employer] states patient has been approved starting 03/10/22 through 11/14/22. ? ? ?Contacted  [employer]   to follow up on patient assistance application for Ozempic. Per representative at  [employer]  states patient has been approved starting4/26/23 through 11/14/22. Delivery may take up to 6 weeks.   ? ?Contacted [employer]  to follow up on patient assistance application for Xarelto. Per representative at [place] states patient was denied due to patient has [employer]. Representative stated that they no longer take insurance anymore since 11/15/21.   ? ?Ethelene Hal ?Clinical Pharmacist Assistant ?[phone]  ?   ?

## 2022-03-14 DIAGNOSIS — E1159 Type 2 diabetes mellitus with other circulatory complications: Secondary | ICD-10-CM | POA: Diagnosis not present

## 2022-03-14 DIAGNOSIS — Z794 Long term (current) use of insulin: Secondary | ICD-10-CM

## 2022-03-14 DIAGNOSIS — I4892 Unspecified atrial flutter: Secondary | ICD-10-CM | POA: Diagnosis not present

## 2022-03-14 DIAGNOSIS — E785 Hyperlipidemia, unspecified: Secondary | ICD-10-CM | POA: Diagnosis not present

## 2022-03-14 DIAGNOSIS — Z7984 Long term (current) use of oral hypoglycemic drugs: Secondary | ICD-10-CM

## 2022-03-15 ENCOUNTER — Telehealth: Payer: Self-pay

## 2022-03-15 ENCOUNTER — Ambulatory Visit: Payer: Medicare HMO

## 2022-03-15 NOTE — Telephone Encounter (Signed)
Unsuccessful attempt to reach patient on preferred number listed in notes for scheduled AWV. Unable to leave message on voicemail. 

## 2022-03-16 ENCOUNTER — Encounter: Payer: Self-pay | Admitting: Internal Medicine

## 2022-03-16 ENCOUNTER — Ambulatory Visit (INDEPENDENT_AMBULATORY_CARE_PROVIDER_SITE_OTHER): Payer: Medicare HMO | Admitting: Internal Medicine

## 2022-03-16 ENCOUNTER — Telehealth: Payer: Self-pay

## 2022-03-16 VITALS — BP 128/72 | HR 72 | Temp 98.2°F | Ht >= 80 in | Wt 275.0 lb

## 2022-03-16 DIAGNOSIS — Z0001 Encounter for general adult medical examination with abnormal findings: Secondary | ICD-10-CM

## 2022-03-16 DIAGNOSIS — I70209 Unspecified atherosclerosis of native arteries of extremities, unspecified extremity: Secondary | ICD-10-CM

## 2022-03-16 DIAGNOSIS — L97512 Non-pressure chronic ulcer of other part of right foot with fat layer exposed: Secondary | ICD-10-CM

## 2022-03-16 DIAGNOSIS — Z7901 Long term (current) use of anticoagulants: Secondary | ICD-10-CM

## 2022-03-16 DIAGNOSIS — I739 Peripheral vascular disease, unspecified: Secondary | ICD-10-CM

## 2022-03-16 DIAGNOSIS — E519 Thiamine deficiency, unspecified: Secondary | ICD-10-CM | POA: Diagnosis not present

## 2022-03-16 DIAGNOSIS — E119 Type 2 diabetes mellitus without complications: Secondary | ICD-10-CM

## 2022-03-16 DIAGNOSIS — I4892 Unspecified atrial flutter: Secondary | ICD-10-CM | POA: Diagnosis not present

## 2022-03-16 DIAGNOSIS — E1151 Type 2 diabetes mellitus with diabetic peripheral angiopathy without gangrene: Secondary | ICD-10-CM | POA: Diagnosis not present

## 2022-03-16 DIAGNOSIS — E11621 Type 2 diabetes mellitus with foot ulcer: Secondary | ICD-10-CM

## 2022-03-16 DIAGNOSIS — E785 Hyperlipidemia, unspecified: Secondary | ICD-10-CM | POA: Diagnosis not present

## 2022-03-16 DIAGNOSIS — Z794 Long term (current) use of insulin: Secondary | ICD-10-CM

## 2022-03-16 LAB — HEPATIC FUNCTION PANEL
ALT: 16 U/L (ref 0–53)
AST: 13 U/L (ref 0–37)
Albumin: 4 g/dL (ref 3.5–5.2)
Alkaline Phosphatase: 87 U/L (ref 39–117)
Bilirubin, Direct: 0.2 mg/dL (ref 0.0–0.3)
Total Bilirubin: 1.1 mg/dL (ref 0.2–1.2)
Total Protein: 7.6 g/dL (ref 6.0–8.3)

## 2022-03-16 LAB — URINALYSIS, ROUTINE W REFLEX MICROSCOPIC
Bilirubin Urine: NEGATIVE
Hgb urine dipstick: NEGATIVE
Ketones, ur: NEGATIVE
Leukocytes,Ua: NEGATIVE
Nitrite: NEGATIVE
Specific Gravity, Urine: >=1.03 — AB (ref 1.000–1.030)
Urine Glucose: >=1000 — AB
Urobilinogen, UA: 0.2 (ref 0.0–1.0)
pH: 5.5 (ref 5.0–8.0)

## 2022-03-16 LAB — CBC WITH DIFFERENTIAL/PLATELET
Basophils Absolute: 0 10*3/uL (ref 0.0–0.1)
Basophils Relative: 0.9 % (ref 0.0–3.0)
Eosinophils Absolute: 0.6 10*3/uL (ref 0.0–0.7)
Eosinophils Relative: 11.8 % — ABNORMAL HIGH (ref 0.0–5.0)
HCT: 39.9 % (ref 39.0–52.0)
Hemoglobin: 13.4 g/dL (ref 13.0–17.0)
Lymphocytes Relative: 17.2 % (ref 12.0–46.0)
Lymphs Abs: 0.9 10*3/uL (ref 0.7–4.0)
MCHC: 33.5 g/dL (ref 30.0–36.0)
MCV: 90.7 fl (ref 78.0–100.0)
Monocytes Absolute: 0.5 10*3/uL (ref 0.1–1.0)
Monocytes Relative: 10 % (ref 3.0–12.0)
Neutro Abs: 3 10*3/uL (ref 1.4–7.7)
Neutrophils Relative %: 60.1 % (ref 43.0–77.0)
Platelets: 175 10*3/uL (ref 150.0–400.0)
RBC: 4.4 Mil/uL (ref 4.22–5.81)
RDW: 12.5 % (ref 11.5–15.5)
WBC: 5 10*3/uL (ref 4.0–10.5)

## 2022-03-16 LAB — LIPID PANEL
Cholesterol: 211 mg/dL — ABNORMAL HIGH (ref 0–200)
HDL: 54.2 mg/dL (ref >39.00)
LDL Cholesterol: 138 mg/dL — ABNORMAL HIGH (ref 0–99)
NonHDL: 156.59
Total CHOL/HDL Ratio: 4
Triglycerides: 93 mg/dL (ref 0.0–149.0)
VLDL: 18.6 mg/dL (ref 0.0–40.0)

## 2022-03-16 LAB — BASIC METABOLIC PANEL
BUN: 10 mg/dL (ref 6–23)
CO2: 29 mEq/L (ref 19–32)
Calcium: 9.1 mg/dL (ref 8.4–10.5)
Chloride: 99 mEq/L (ref 96–112)
Creatinine, Ser: 1.18 mg/dL (ref 0.40–1.50)
GFR: 58.49 mL/min — ABNORMAL LOW (ref >60.00)
Glucose, Bld: 327 mg/dL — ABNORMAL HIGH (ref 70–99)
Potassium: 4 mEq/L (ref 3.5–5.1)
Sodium: 135 mEq/L (ref 135–145)

## 2022-03-16 LAB — MICROALBUMIN / CREATININE URINE RATIO
Creatinine,U: 201.9 mg/dL
Microalb Creat Ratio: 4 mg/g (ref 0.0–30.0)
Microalb, Ur: 8 mg/dL — ABNORMAL HIGH (ref 0.0–1.9)

## 2022-03-16 LAB — HEMOGLOBIN A1C: Hgb A1c MFr Bld: 9.5 % — ABNORMAL HIGH (ref 4.6–6.5)

## 2022-03-16 MED ORDER — TOUJEO MAX SOLOSTAR 300 UNIT/ML ~~LOC~~ SOPN: 30.0000 [IU] | PEN_INJECTOR | Freq: Every day | SUBCUTANEOUS | 0 refills | Status: DC

## 2022-03-16 MED ORDER — METFORMIN HCL 500 MG PO TABS: 1000.0000 mg | ORAL_TABLET | Freq: Two times a day (BID) | ORAL | 0 refills | Status: DC

## 2022-03-16 MED ORDER — NEXLIZET 180-10 MG PO TABS: 1.0000 | ORAL_TABLET | Freq: Every day | ORAL | 1 refills | Status: DC

## 2022-03-16 MED ORDER — TIRZEPATIDE 2.5 MG/0.5ML ~~LOC~~ SOAJ: 2.5000 mg | SUBCUTANEOUS | 0 refills | Status: DC

## 2022-03-16 NOTE — Telephone Encounter (Signed)
Per PCP, pt's insurance will no longer cover xarelto and he does not qualify for financial assistance for xarelto through the manufacturer. Pt is to be transitioned to warfarin.  ?Tried to contact pt on preferred mobile number but no answer and no VM ?LVM on home number.  ? ?Transitioning from [place] to warfarin best practice from Up-To-Date for rivaroxaban is; ? ?Discontinue rivaroxaban and start a parenteral anticoagulant with warfarin; continue the parenteral agent until the INR is therapeutic on warfarin (PI). Note that rivaroxaban can contribute to INR elevation. ? ?-or- ? ?Overlap warfarin with rivaroxaban until the INR is therapeutic on warfarin, testing right before the next rivaroxaban dose to minimize the effect of rivaroxaban on INR elevation (ASH).* ?

## 2022-03-16 NOTE — Progress Notes (Signed)
? ?Subjective:  ?Patient ID: Jason [name], male    DOB: [birth year]  Age: 81 y.o. MRN: [id number] ? ?CC: Annual Exam, Diabetes, Hyperlipidemia, and Anemia ? ? ?HPI ?Jason [name] presents for a CPX and f/up -  ? ?He complains of chronic, unchanged chest pain and shortness of breath that only occurs at rest.  His insurance will not pay for Xarelto so he has to be switched to Coumadin.  He denies diaphoresis, dizziness, lightheadedness, edema, or palpitations. ? ?Outpatient Medications Prior to Visit  ?Medication Sig Dispense Refill  ? B-D UF III MINI PEN NEEDLES 31G X 5 MM MISC     ? Continuous Blood Gluc Receiver (FREESTYLE LIBRE 2 READER) DEVI 1 Act by Does not apply route daily. 2 each 5  ? Continuous Blood Gluc Sensor (FREESTYLE LIBRE 2 SENSOR) MISC 1 Act by Does not apply route daily. 2 each 5  ? cyclobenzaprine (FLEXERIL) 10 MG tablet Take 1 tablet (10 mg total) by mouth 2 (two) times daily as needed for muscle spasms. 20 tablet 0  ? Glucagon (GVOKE HYPOPEN 2-PACK) 1 MG/0.2ML SOAJ Inject 1 Act into the skin daily as needed. 2 mL 5  ? Insulin Pen Needle 32G X 6 MM MISC 1 Act by Does not apply route in the morning and at bedtime. 100 each 0  ? lidocaine (LIDODERM) 5 % Place 1 patch onto the skin daily. Remove & Discard patch within 12 hours or as directed by MD 30 patch 0  ? Multiple Vitamins-Minerals (MULTIVITAMIN WITH MINERALS) tablet Take 1 tablet by mouth daily.    ? silver sulfADIAZINE (SILVADENE) 1 % cream Apply pea-sized amount to wound daily. 50 g 0  ? simvastatin (ZOCOR) 40 MG tablet Take 1 tablet (40 mg total) by mouth daily at 6 PM. 90 tablet 1  ? Wound Dressings (MEDIHONEY WOUND/BURN DRESSING) GEL Apply to affected are 3 times a week, and cover with sterile dressing. 15 mL 2  ? insulin glargine, 2 Unit Dial, (TOUJEO MAX SOLOSTAR) 300 UNIT/ML Solostar Pen Inject 30 Units into the skin daily. 12 mL 0  ? metFORMIN (GLUCOPHAGE) 500 MG tablet Take 2 tablets (1,000 mg total) by mouth 2 (two) times daily  with a meal. 360 tablet 0  ? rivaroxaban (XARELTO) 20 MG TABS tablet Take 1 tablet (20 mg total) by mouth daily with supper. 90 tablet 0  ? Semaglutide,0.25 or 0.'5MG'$ /DOS, (OZEMPIC, 0.25 OR 0.5 MG/DOSE,) 2 MG/1.5ML SOPN Inject 1 mg into the skin once a week. 9 mL 1  ? ?No facility-administered medications prior to visit.  ? ? ?ROS ?Review of Systems  ?Constitutional:  Negative for appetite change, diaphoresis, fatigue and unexpected weight change.  ?HENT: Negative.    ?Eyes: Negative.   ?Respiratory:  Positive for shortness of breath. Negative for cough, chest tightness and wheezing.   ?Cardiovascular:  Positive for chest pain. Negative for palpitations and leg swelling.  ?Gastrointestinal:  Negative for abdominal pain, constipation, diarrhea, nausea and vomiting.  ?Endocrine: Negative.   ?Genitourinary: Negative.  Negative for difficulty urinating and dysuria.  ?Musculoskeletal: Negative.  Negative for myalgias.  ?Skin: Negative.  Negative for color change.  ?Neurological: Negative.  Negative for dizziness, weakness, light-headedness and headaches.  ?Hematological:  Negative for adenopathy. Does not bruise/bleed easily.  ?Psychiatric/Behavioral: Negative.    ? ?Objective:  ?BP 128/72 (BP Location: Right Arm, Patient Position: Sitting, Cuff Size: Large)   Pulse 72   Temp 98.2 ?F (36.8 ?C) (Oral)   Ht '6\' 8"'$  (2.032  m)   Wt 275 lb (124.7 kg)   SpO2 95%   BMI 30.21 kg/m?  ? ?BP Readings from Last 3 Encounters:  ?03/16/22 128/72  ?02/25/22 120/60  ?02/20/22 119/70  ? ? ?Wt Readings from Last 3 Encounters:  ?03/16/22 275 lb (124.7 kg)  ?02/25/22 273 lb (123.8 kg)  ?08/18/21 289 lb (131.1 kg)  ? ? ?Physical Exam ?Vitals reviewed.  ?HENT:  ?   Nose: Nose normal.  ?   Mouth/Throat:  ?   Mouth: Mucous membranes are moist.  ?Eyes:  ?   General: No scleral icterus. ?   Conjunctiva/sclera: Conjunctivae normal.  ?Cardiovascular:  ?   Rate and Rhythm: Normal rate and regular rhythm. Occasional Extrasystoles are present. ?    Heart sounds: Murmur heard.  ?Systolic murmur is present with a grade of 1/6.  ?  No friction rub. No gallop.  ?   Comments: EKG- ?A fib with one PVC, 72 bpm ?Flat T waves ?No LVH or Q waves ?Pulmonary:  ?   Effort: Pulmonary effort is normal.  ?   Breath sounds: No stridor. No wheezing, rhonchi or rales.  ?Abdominal:  ?   General: Abdomen is flat.  ?   Palpations: There is no mass.  ?   Tenderness: There is no abdominal tenderness. There is no guarding.  ?   Hernia: No hernia is present.  ?Musculoskeletal:  ?   Cervical back: Neck supple.  ?   Right lower leg: No edema.  ?   Left lower leg: No edema.  ?Lymphadenopathy:  ?   Cervical: No cervical adenopathy.  ?Skin: ?   General: Skin is warm and dry.  ?Neurological:  ?   General: No focal deficit present.  ?   Mental Status: He is alert.  ?Psychiatric:     ?   Mood and Affect: Mood normal.     ?   Behavior: Behavior normal.  ? ? ?Lab Results  ?Component Value Date  ? WBC 5.0 03/16/2022  ? HGB 13.4 03/16/2022  ? HCT 39.9 03/16/2022  ? PLT 175.0 03/16/2022  ? GLUCOSE 327 (H) 03/16/2022  ? CHOL 211 (H) 03/16/2022  ? TRIG 93.0 03/16/2022  ? HDL 54.20 03/16/2022  ? LDLCALC 138 (H) 03/16/2022  ? ALT 16 03/16/2022  ? AST 13 03/16/2022  ? NA 135 03/16/2022  ? K 4.0 03/16/2022  ? CL 99 03/16/2022  ? CREATININE 1.18 03/16/2022  ? BUN 10 03/16/2022  ? CO2 29 03/16/2022  ? TSH 0.78 05/07/2021  ? PSA 1.13 04/18/2019  ? INR 1.05 10/17/2012  ? HGBA1C 9.5 (H) 03/16/2022  ? MICROALBUR 8.0 (H) 03/16/2022  ? ? ?DG Lumbar Spine Complete ? ?Result Date: 02/20/2022 ?CLINICAL DATA:  Low back pain after fall from stool. EXAM: LUMBAR SPINE - COMPLETE 4+ VIEW COMPARISON:  None. FINDINGS: Four view study. Bones are diffusely demineralized. Mild superior endplate depression at X2-J1 is age indeterminate. No definite acute lumbar spine fracture. Loss of disc height noted L4-5 and L5-S1 facets are well aligned bilaterally. SI joints are unremarkable. IMPRESSION: 1. Mild superior endplate  depression at L1 and L2 is age indeterminate. 2. Degenerative disc disease at L4-5 and L5-S1. Electronically Signed   By: [name] M.D.   On: 02/20/2022 12:53  ? ?CT Lumbar Spine Wo Contrast ? ?Result Date: 02/20/2022 ?CLINICAL DATA:  [place] from a stool 4 days ago.  Back pain. EXAM: CT LUMBAR SPINE WITHOUT CONTRAST CT PELVIS WITHOUT CONTRAST TECHNIQUE: Multidetector CT imaging of  the lumbar spine was performed without intravenous contrast administration. Multiplanar CT image reconstructions were also generated. RADIATION DOSE REDUCTION: This exam was performed according to the departmental dose-optimization program which includes automated exposure control, adjustment of the mA and/or kV according to patient size and/or use of iterative reconstruction technique. COMPARISON:  Current lumbar spine radiographs. CT abdomen and pelvis without contrast, 01/20/2017. FINDINGS: CT LUMBAR SPINE Segmentation: 5 lumbar type vertebrae. Alignment: Normal. Vertebrae: No acute fracture. There is a large Schmorl's node along the upper endplate of L5, stable when compared to the previous abdomen pelvis CT. No bone lesion. Paraspinal and other soft tissues: No acute findings. Low attenuation upper pole left renal mass consistent with a cyst. Nonobstructing stone in the midpole the right kidney. Disc levels: Mild loss of disc height at L4-L5. Remaining discs are well preserved in height. Mild degrees of disc bulging. No disc herniation. No significant stenosis. CT PELVIS Urinary Tract: Normal bladder. Visualized ureters normal in course and in caliber. Bowel: No acute findings. No evidence of bowel obstruction. No wall thickening or inflammation. Multiple sigmoid colon diverticula. Vascular/Lymphatic: Mildly enlarged inguinal lymph nodes bilaterally, 1.5 cm on the left and 1.7 cm on the right. Mild iliac artery and femoral artery atherosclerotic calcifications. Reproductive:  Enlarged prostate, 5.8 x 4.2 cm transversely. Other: No  pelvic free fluid. No soft tissue hematoma or apparent contusion. Musculoskeletal: No fracture or acute finding. No bone lesion. Mild bilateral concentric hip joint space narrowing. Small marginal osteophytes from the b

## 2022-03-16 NOTE — Patient Instructions (Signed)
Health Maintenance, Male Adopting a healthy lifestyle and getting preventive care are important in promoting health and wellness. Ask your health care provider about: The right schedule for you to have regular tests and exams. Things you can do on your own to prevent diseases and keep yourself healthy. What should I know about diet, weight, and exercise? Eat a healthy diet  Eat a diet that includes plenty of vegetables, fruits, low-fat dairy products, and lean protein. Do not eat a lot of foods that are high in solid fats, added sugars, or sodium. Maintain a healthy weight Body mass index (BMI) is a measurement that can be used to identify possible weight problems. It estimates body fat based on height and weight. Your health care provider can help determine your BMI and help you achieve or maintain a healthy weight. Get regular exercise Get regular exercise. This is one of the most important things you can do for your health. Most adults should: Exercise for at least 150 minutes each week. The exercise should increase your heart rate and make you sweat (moderate-intensity exercise). Do strengthening exercises at least twice a week. This is in addition to the moderate-intensity exercise. Spend less time sitting. Even light physical activity can be beneficial. Watch cholesterol and blood lipids Have your blood tested for lipids and cholesterol at 80 years of age, then have this test every 5 years. You may need to have your cholesterol levels checked more often if: Your lipid or cholesterol levels are high. You are older than 80 years of age. You are at high risk for heart disease. What should I know about cancer screening? Many types of cancers can be detected early and may often be prevented. Depending on your health history and family history, you may need to have cancer screening at various ages. This may include screening for: Colorectal cancer. Prostate cancer. Skin cancer. Lung  cancer. What should I know about heart disease, diabetes, and high blood pressure? Blood pressure and heart disease High blood pressure causes heart disease and increases the risk of stroke. This is more likely to develop in people who have high blood pressure readings or are overweight. Talk with your health care provider about your target blood pressure readings. Have your blood pressure checked: Every 3-5 years if you are 18-39 years of age. Every year if you are 40 years old or older. If you are between the ages of 65 and 75 and are a current or former smoker, ask your health care provider if you should have a one-time screening for abdominal aortic aneurysm (AAA). Diabetes Have regular diabetes screenings. This checks your fasting blood sugar level. Have the screening done: Once every three years after age 45 if you are at a normal weight and have a low risk for diabetes. More often and at a younger age if you are overweight or have a high risk for diabetes. What should I know about preventing infection? Hepatitis B If you have a higher risk for hepatitis B, you should be screened for this virus. Talk with your health care provider to find out if you are at risk for hepatitis B infection. Hepatitis C Blood testing is recommended for: Everyone born from 1945 through 1965. Anyone with known risk factors for hepatitis C. Sexually transmitted infections (STIs) You should be screened each year for STIs, including gonorrhea and chlamydia, if: You are sexually active and are younger than 80 years of age. You are older than 80 years of age and your   health care provider tells you that you are at risk for this type of infection. Your sexual activity has changed since you were last screened, and you are at increased risk for chlamydia or gonorrhea. Ask your health care provider if you are at risk. Ask your health care provider about whether you are at high risk for HIV. Your health care provider  may recommend a prescription medicine to help prevent HIV infection. If you choose to take medicine to prevent HIV, you should first get tested for HIV. You should then be tested every 3 months for as long as you are taking the medicine. Follow these instructions at home: Alcohol use Do not drink alcohol if your health care provider tells you not to drink. If you drink alcohol: Limit how much you have to 0-2 drinks a day. Know how much alcohol is in your drink. In the [place] one drink equals one 12 oz bottle of beer (355 mL), one 5 oz glass of wine (148 mL), or one 1 oz glass of hard liquor (44 mL). Lifestyle Do not use any products that contain nicotine or tobacco. These products include cigarettes, chewing tobacco, and vaping devices, such as e-cigarettes. If you need help quitting, ask your health care provider. Do not use street drugs. Do not share needles. Ask your health care provider for help if you need support or information about quitting drugs. General instructions Schedule regular health, dental, and eye exams. Stay current with your vaccines. Tell your health care provider if: You often feel depressed. You have ever been abused or do not feel safe at home. Summary Adopting a healthy lifestyle and getting preventive care are important in promoting health and wellness. Follow your health care provider's instructions about healthy diet, exercising, and getting tested or screened for diseases. Follow your health care provider's instructions on monitoring your cholesterol and blood pressure. This information is not intended to replace advice given to you by your health care provider. Make sure you discuss any questions you have with your health care provider. Document Revised: 03/23/2021 Document Reviewed: 03/23/2021 Elsevier Patient Education  2023 Elsevier Inc.  

## 2022-03-17 ENCOUNTER — Telehealth: Payer: Self-pay | Admitting: Internal Medicine

## 2022-03-17 ENCOUNTER — Other Ambulatory Visit: Payer: Self-pay | Admitting: Internal Medicine

## 2022-03-17 DIAGNOSIS — Z7901 Long term (current) use of anticoagulants: Secondary | ICD-10-CM

## 2022-03-17 DIAGNOSIS — I4811 Longstanding persistent atrial fibrillation: Secondary | ICD-10-CM

## 2022-03-17 MED ORDER — WARFARIN SODIUM 5 MG PO TABS: ORAL_TABLET | ORAL | 0 refills | Status: DC

## 2022-03-17 NOTE — Telephone Encounter (Signed)
Jason [name] with Jason [name] calls today with an FYI regarding PT's blood sugar. PT had an elevated fasting blood sugar of 265. PT states that he does not have his medicine for his 30 units of insulin. He also states he has been inconsistent with taking the insulin.  ? ?CB if needed: [phone] ?

## 2022-03-17 NOTE — Telephone Encounter (Signed)
Contacted pt and advised of PCP request for pt to start warfarin. Pt was in agreement with plan of care. Explained what warfarin does and the risks involved with taking warfarin. Advised pt a script would be sent, [facility] per pt request, for 5 mg tablets and that he should take 1 tablet every evening at about the same time. Advised pt of need for continuous monitoring of the medication due to the risk of interactions which can increase the risk of clots or bleeding. Pt agreed to apt on 5/9 at 11:30 at [facility]. Pt has not taken xarelto for some time so advised pt start warfarin tonight if possible and if not possible to start tomorrow evening. Advised if any signs or symptoms of bleeding to contact the clinic. Tried to give pt number to coumadin clinic but he could not write it down at that time. Advised this nurse would f/u with him tomorrow to make sure he has that phone number. Pt verbalized understanding.  ? ?Sent in warfarin to [facility] of pt's choice.  ?

## 2022-03-17 NOTE — Telephone Encounter (Signed)
Tried to contact pt but had to leave VM on home number. Cell number someone picked up but did not speak.  ?

## 2022-03-19 ENCOUNTER — Telehealth: Payer: Self-pay

## 2022-03-19 ENCOUNTER — Telehealth: Payer: Self-pay | Admitting: Internal Medicine

## 2022-03-19 DIAGNOSIS — Z0001 Encounter for general adult medical examination with abnormal findings: Secondary | ICD-10-CM | POA: Insufficient documentation

## 2022-03-19 NOTE — Telephone Encounter (Signed)
I have spoke to the [place] and informed him that I would reach out to the pt in regard to Sx listed and schedule an OV.  ? ?Pt stated that his symptoms are not new and he declined to make an OV at this time. I have informed him if Sx worsen or change to give the office a call so he can be seen. He expressed understanding.  ?

## 2022-03-19 NOTE — Telephone Encounter (Signed)
Contacted pt and gave him the phone number for the coumadin clinic.  ?

## 2022-03-19 NOTE — Telephone Encounter (Signed)
Physical Therapist Clair Gulling) says pt is 'unwell', short of breath, irregular heart rate, and some diarrhea. Please call Herbert Deaner asap to collaborate on care.  ? ?Phone # : [phone]  ?

## 2022-03-19 NOTE — Telephone Encounter (Signed)
Key: [id number] ? ?Approved ?Coverage Ends on 11/14/2022 ?

## 2022-03-22 ENCOUNTER — Telehealth: Payer: Self-pay | Admitting: Internal Medicine

## 2022-03-22 NOTE — Telephone Encounter (Signed)
Clair Gulling called in to give report on pt.  ? ?Pt states that he is still having persistent diarrhea. Clair Gulling reports HR was mildly irregular today.  ? ?States pt does not have a current med list. Fax number given- I will fax off med list.  ? ?Fax- [phone]  ?

## 2022-03-23 ENCOUNTER — Telehealth: Payer: Self-pay | Admitting: Internal Medicine

## 2022-03-23 ENCOUNTER — Ambulatory Visit: Payer: Medicare HMO

## 2022-03-23 NOTE — Telephone Encounter (Signed)
Spoke with [facility] - per pharmacy Xarelto was filled for 90 day supply 03/22/2022 and was shipped to patient ? ?Attempted to call patient to confirm and review that he would be taking xarelto and would stop warfarin - no answer, unable to leave message, will need to follow up tomorrow to further discuss with patient  ? ?Tomasa Blase, PharmD ?Clinical Pharmacist, [place]  ?

## 2022-03-23 NOTE — Telephone Encounter (Signed)
Per Dr. Ronnald Ramp, d/c warfarin. Will look into placing pt back on xarelto or eliquis. Will send msg to clinic pharmacist to request further help with obtaining either medication for the pt. ? ?Contacted April, [place], and advised d/c warfarin and office will work on trying to get xarelto or eliquis through insurance for pt. She reported pt did not have several of his medications, simvastatin, Mounjaro, or Nexlilzet. Pt reports PCP sent script to mail order pharmacy. Advised Mounjaro was sent to [facility] but others were sent to mail order. April reports she will remove warfarin so pt does not take it by accident. She will also place new medication in pill box when they arrive. ? ?April requested to increase home visits to twice weekly. Advised a msg would be sent to PCP for authorization of increase on weekly visits. April verbalized understanding and can be reached at [phone]. ? ?

## 2022-03-23 NOTE — Telephone Encounter (Signed)
Pt NS first coumadin clinic apt today at 11:30.  ?Contacted April at [facility] who reports pt is not compliant with medications and also has cognitive decline leaving him unable to remember what to take or when, or if he had already taken the medication. She is concerned warfarin is not the best anticoagulant for this pt. Advised why pt was changed from xarelto to warfarin. She reports the pt is not doing well with taking the two types of insulin he is prescribed. She has picked up a pill box and is going to see him today. She will fill pill box for him but is unsure if the box will help. She has capability to check INR today. She will check INR and contact the coumadin clinic with result this afternoon.  ?Thanked April for taking care of pt and willingness to go above and beyond by picking up a pill box, to make sure the pt is taking his medication as prescribed. ?Advised a msg would be sent to PCP to update concerning medication adherence. Advised if anything changes to contact the clinic. Gave April direct number to coumadin clinic. April verbalized understanding.  ?

## 2022-03-23 NOTE — Telephone Encounter (Signed)
Discussed with Linna Hoff, clinic pharmacist, concerning trying to get the pt back on xarelto or eliquis. Pt does not qualify for pt assistance for either medication. Concerns are for medication adherence of any anticoagulant the pt is placed on. Linna Hoff will look into further options for the pt concerning his medications.  ?

## 2022-03-23 NOTE — Telephone Encounter (Signed)
Jason [name] has concerns with patients medications - needs to discuss patient's inability to manage medications ?

## 2022-03-24 ENCOUNTER — Telehealth: Payer: Self-pay | Admitting: Internal Medicine

## 2022-03-24 NOTE — Telephone Encounter (Signed)
LVM for April, [facility] RN, to discuss medications.  ?

## 2022-03-24 NOTE — Telephone Encounter (Signed)
Advised Jason [name] that the pharmacy reported to the pharmacist in the clinic that the Retsof has been shipped and the pt should have it or it is in transit. Jason [name] did not see any xarelto at the house but will check the next time she is out. If the xarelto does come in from the mail order pharmacy she will include it in the pt's pillbox.  ?She is still requesting to see the pt twice weekly to assure pt is compliant and understanding medications. She has not heard back from PCP yet if he will agree with twice weekly. Currently she is going once weekly. Advised a msg would be sent to PCP and the office will f/u.  ? ?

## 2022-03-24 NOTE — Telephone Encounter (Signed)
Clair Gulling, physical therapist from [place] called to let us know that patient's blood pressure while seated was 140/60 and was 120/60 when standing. He states that patient is asymptomatic and feels fine. Also notes that heart rate was irregular. Patient is also requesting early discharge from home health PT because he has other things he needs to be doing. If you have further questions, Jim's contact number is [phone] ?

## 2022-03-24 NOTE — Telephone Encounter (Signed)
April LVM. ?Tried to contact but had to LVM ?

## 2022-03-25 NOTE — Telephone Encounter (Addendum)
Per PCP,  ?Yes on the [place]  ? ?TJ   ? ?Contacted April, [facility] nurse and advised twice weekly visits are authorized by the PCP. April verbalized understanding and will f/u if any further concerns with the pt's medications. ?

## 2022-03-30 ENCOUNTER — Telehealth: Payer: Self-pay

## 2022-03-30 ENCOUNTER — Ambulatory Visit (INDEPENDENT_AMBULATORY_CARE_PROVIDER_SITE_OTHER): Payer: Medicare HMO

## 2022-03-30 DIAGNOSIS — I4892 Unspecified atrial flutter: Secondary | ICD-10-CM

## 2022-03-30 DIAGNOSIS — E1151 Type 2 diabetes mellitus with diabetic peripheral angiopathy without gangrene: Secondary | ICD-10-CM

## 2022-03-30 DIAGNOSIS — E785 Hyperlipidemia, unspecified: Secondary | ICD-10-CM

## 2022-03-30 DIAGNOSIS — Z7901 Long term (current) use of anticoagulants: Secondary | ICD-10-CM

## 2022-03-30 NOTE — Telephone Encounter (Signed)
Pt is in need of an order diabetic shoes? ? ?Please advise ?

## 2022-03-30 NOTE — Progress Notes (Signed)
? ?Chronic Care Management ?Pharmacy Note ? ?03/30/2022 ?Name:  Jason [name] MRN:  [id number] DOB:  17-Aug-1941 ? ?Summary: ?-spoke with patient, confirms that he has received Xarelto - restarted medication today, no longer taking warfarin  ?-Home health nurse has been approved to visit twice weekly, spoke with April the [facility] nurse, reports that pill box has been filled - contains simvastatin, metformin, and xarelto  ?-Pt reports that he has been taking his toujeo 30 units daily - started mounjaro 2 weeks ago - using on saturdays  ?-BG recently has been averaging 90-120's - denies any issues with hypoglycemia ?-Dentistry has been planning extraction of multiple teeth, no date set for dental extraction at this time  ?-Per home health nurse - weekly pill box has helped increase adherence to medications, but still has multiple days throughout the week that are missed, pt unsure at times if he has taken toujeo - have tried setting alarms to remind to take but has been unsuccessful  ? ?Recommendations/Changes made from today's visit: ?-Reviewed with patient importance of compliance to medications - reviewed purpose of medications in maintaining control of chronic disease states  ?-Patient to continue checking BG daily and record in logbook to discuss with next visit - pt to reach out should BG average <100 ?-Advised for patient to review medications / BG monitoring with home health nurse when they visit - to ensure that weekly pill box is filled appropriately today ?-Reminded patient to reach out to office with any medication issues or concerns ? ? ?Subjective: ?Jason [name] is an 81 y.o. year old male who is a primary patient of [name], MD.  The CCM team was consulted for assistance with disease management and care coordination needs.   ? ?Engaged with patient by telephone for follow up visit in response to provider referral for pharmacy case management and/or care coordination services.  ? ?Consent to  Services:  ?The patient was given information about Chronic Care Management services, agreed to services, and gave verbal consent prior to initiation of services.  Please see initial visit note for detailed documentation.  ? ?Patient Care Team: ?[name], MD as PCP - General (Internal Medicine) ?Minus Breeding, MD as PCP - Cardiology (Cardiology) ?Tomasa Blase, [facility] (Pharmacist) ? ?Recent office visits: ?03/16/2022 - Dr. Ronnald Ramp - warfarin started, mounjaro and nexlizet rx'd ? ?Recent consult visits: ?None since last visit  ? ?Hospital visits: ?None since last visit  ? ?Objective: ? ?Lab Results  ?Component Value Date  ? CREATININE 1.18 03/16/2022  ? BUN 10 03/16/2022  ? GFR 58.49 (L) 03/16/2022  ? GFRNONAA >60 05/17/2019  ? GFRAA >60 05/17/2019  ? NA 135 03/16/2022  ? K 4.0 03/16/2022  ? CALCIUM 9.1 03/16/2022  ? CO2 29 03/16/2022  ? GLUCOSE 327 (H) 03/16/2022  ? ? ?Lab Results  ?Component Value Date/Time  ? HGBA1C 9.5 (H) 03/16/2022 08:35 AM  ? HGBA1C 7.5 (A) 08/18/2021 02:40 PM  ? HGBA1C 7.5 08/18/2021 02:40 PM  ? HGBA1C 7.5 (A) 08/18/2021 02:40 PM  ? HGBA1C 7.5 (A) 08/18/2021 02:40 PM  ? HGBA1C 9.3 (H) 05/07/2021 01:54 PM  ? GFR 58.49 (L) 03/16/2022 08:35 AM  ? GFR 57.09 (L) 05/07/2021 01:54 PM  ? MICROALBUR 8.0 (H) 03/16/2022 08:35 AM  ? MICROALBUR 2.1 05/21/2020 02:58 PM  ?  ?Last diabetic Eye exam:  ?Lab Results  ?Component Value Date/Time  ? HMDIABEYEEXA No Retinopathy 04/29/2021 12:00 AM  ?  ?Last diabetic Foot exam:  ?  Lab Results  ?Component Value Date/Time  ? HMDIABFOOTEX done 08/09/2014 12:00 AM  ?  ? ?Lab Results  ?Component Value Date  ? CHOL 211 (H) 03/16/2022  ? HDL 54.20 03/16/2022  ? LDLCALC 138 (H) 03/16/2022  ? TRIG 93.0 03/16/2022  ? CHOLHDL 4 03/16/2022  ? ? ? ?  Latest Ref Rng & Units 03/16/2022  ?  8:35 AM 05/21/2020  ?  2:58 PM 05/17/2019  ?  6:29 AM  ?Hepatic Function  ?Total Protein 6.0 - 8.3 g/dL 7.6   6.7   6.5    ?Albumin 3.5 - 5.2 g/dL 4.0    3.6    ?AST 0 - 37 U/L _0 ?ALT 0 - 53 U/L _1 ?Alk Phosphatase 39 - 117 U/L 87    32    ?Total Bilirubin 0.2 - 1.2 mg/dL 1.1   0.6   1.5    ?Bilirubin, Direct 0.0 - 0.3 mg/dL 0.2   0.2     ? ? ?Lab Results  ?Component Value Date/Time  ? TSH 0.78 05/07/2021 01:54 PM  ? TSH 0.85 04/18/2019 10:35 AM  ? ? ? ?  Latest Ref Rng & Units 03/16/2022  ?  8:35 AM 08/03/2021  ? 10:42 AM 09/30/2020  ? 12:10 PM  ?CBC  ?WBC 4.0 - 10.5 K/uL 5.0   4.6   4.1    ?Hemoglobin 13.0 - 17.0 g/dL 13.4   12.1   13.1    ?Hematocrit 39.0 - 52.0 % 39.9   35.5   39.1    ?Platelets 150.0 - 400.0 K/uL 175.0   176   165.0    ? ? ?No results found for: VD25OH ? ?Clinical ASCVD: Yes  - PAD ?The ASCVD Risk score (Arnett DK, et al., 2019) failed to calculate for the following reasons: ?  The 2019 ASCVD risk score is only valid for ages 71 to 57   ? ? ?  05/13/2021  ? 11:30 AM 10/20/2020  ?  9:29 AM 12/31/2019  ?  1:56 PM  ?Depression screen PHQ 2/9  ?Decreased Interest 0 1 2  ?Down, Depressed, Hopeless 1 2 0  ?PHQ - 2 Score _2 ?Altered sleeping   1  ?Tired, decreased energy   1  ?Change in appetite   1  ?Feeling bad or failure about yourself    0  ?Trouble concentrating   1  ?Moving slowly or fidgety/restless   1  ?Suicidal thoughts   0  ?PHQ-9 Score   7  ?Difficult doing work/chores   Not difficult at all  ? ? ?Social History  ? ?Tobacco Use  ?Smoking Status Former  ? Packs/day: 0.10  ? Years: 5.00  ? Pack years: 0.50  ? Types: Cigarettes  ? Quit date: 05/10/2015  ? Years since quitting: 6.8  ?Smokeless Tobacco Never  ? ?BP Readings from Last 3 Encounters:  ?03/16/22 128/72  ?02/25/22 120/60  ?02/20/22 119/70  ? ?Pulse Readings from Last 3 Encounters:  ?03/16/22 72  ?02/25/22 73  ?02/20/22 68  ? ?Wt Readings from Last 3 Encounters:  ?03/16/22 275 lb (124.7 kg)  ?02/25/22 273 lb (123.8 kg)  ?08/18/21 289 lb (131.1 kg)  ? ?BMI Readings from Last 3 Encounters:  ?03/16/22 30.21 kg/m?  ?02/25/22 29.99 kg/m?  ?08/18/21 31.75 kg/m?  ? ? ?Assessment/Interventions: Review of  patient past medical history, allergies, medications, health status,  including review of consultants reports, laboratory and other test data, was performed as part of comprehensive evaluation and provision of chronic care management services.  ? ?SDOH:  (Social Determinants of Health) assessments and interventions performed: Yes ? ? ?SDOH Screenings  ? ?Alcohol Screen: Not on file  ?Depression (PHQ2-9): Low Risk   ? PHQ-2 Score: 1  ?Financial Resource Strain: Medium Risk  ? Difficulty of Paying Living Expenses: Somewhat hard  ?Food Insecurity: Food Insecurity Present  ? Worried About Charity fundraiser in the Last Year: Sometimes true  ? Ran Out of Food in the Last Year: Sometimes true  ?Housing: Medium Risk  ? Last Housing Risk Score: 1  ?Physical Activity: Not on file  ?Social Connections: Not on file  ?Stress: Not on file  ?Tobacco Use: Medium Risk  ? Smoking Tobacco Use: Former  ? Smokeless Tobacco Use: Never  ? Passive Exposure: Not on file  ?Transportation Needs: No Transportation Needs  ? Lack of Transportation (Medical): No  ? Lack of Transportation (Non-Medical): No  ? ? ?CCM Care Plan ? ?Allergies  ?Allergen Reactions  ? Bactrim [Sulfamethoxazole-Trimethoprim] Rash  ? ? ?Medications Reviewed Today   ? ? Reviewed by Tomasa Blase, [facility] (Pharmacist) on 03/30/22 at [place] List Status: <None>  ? ?Medication Order Taking? Sig Documenting Provider Last Dose Status Informant  ?B-D UF III MINI PEN NEEDLES 31G X 5 MM MISC [id number]   [provider]  Active   ?Bempedoic Acid-Ezetimibe (NEXLIZET) 180-10 MG TABS [id number] No Take 1 tablet by mouth daily.  ?Patient not taking: Reported on 03/30/2022  ? [name], MD Not Taking Active   ?Continuous Blood Gluc Receiver (FREESTYLE LIBRE 2 READER) DEVI [id number]  1 Act by Does not apply route daily. [name], MD  Active   ?Continuous Blood Gluc Sensor (FREESTYLE LIBRE 2 SENSOR) MISC [id number]  1 Act by Does not apply route daily. [name], MD  Active   ?cyclobenzaprine (FLEXERIL) 10 MG tablet [id number] No Take 1 tablet (10 mg total) by mouth 2 (two) times daily as needed for muscle spasms.  ?Patient not taking: Reported on 03/30/2022  ? [place],

## 2022-03-30 NOTE — Telephone Encounter (Signed)
DME order has been entered and given to PCP to sign.  ?

## 2022-03-30 NOTE — Telephone Encounter (Signed)
Created in error

## 2022-03-30 NOTE — Patient Instructions (Signed)
Visit Information ? ?Following are the goals we discussed today:  ? ?Manage My Medicine  ? ?Timeframe:  Long-Range Goal ?Priority:  High ?Start Date:      02/05/21                       ?Expected End Date:  01/28/2023                ? ?Follow Up Date June 2023 ?  ?- call for medicine refill 2 or 3 days before it runs out ?- call if I am sick and can't take my medicine ?- keep a list of all the medicines I take; vitamins and herbals too ?- use a pillbox to sort medicine  ?- Let the doctors know when you cannot afford or you do not know how to take a medication ?  ?Why is this important?   ?These steps will help you keep on track with your medicines. ? ?Monitor and Manage My Blood Sugar  ? ?Timeframe:  Long-Range Goal ?Priority:  High ?Start Date:    02/05/21                         ?Expected End Date:    05/08/21                  ? ?Follow Up Date 02/12/21 ?  ?- check blood sugar at prescribed times (morning and bedtime) ?- check blood sugar if I feel it is too high or too low ?- enter blood sugar readings and medication or insulin into daily log ?- take the blood sugar log to all doctor visits  ?-Inject Toujeo 30 units daily  ?-Use mounjaro 2.'5mg'$  weekly on Saturdays  ?  ?Why is this important?   ?Checking your blood sugar at home helps to keep it from getting very high or very low.  ?Writing the results in a diary or log helps the doctor know how to care for you.  ?Your blood sugar log should have the time, date and the results.  ?Also, write down the amount of insulin or other medicine that you take.  ?Other information, like what you ate, exercise done and how you were feeling, will also be helpful.   ? ?Plan: Telephone follow up appointment with care management team member scheduled for:  1 month ?The patient has been provided with contact information for the care management team and has been advised to call with any health related questions or concerns.  ? ?Tomasa Blase, PharmD ?Clinical Pharmacist, [facility]  ? ?Please call the care guide team at [phone] if you need to cancel or reschedule your appointment.  ? ?The patient verbalized understanding of instructions, educational materials, and care plan provided today and declined offer to receive copy of patient instructions, educational materials, and care plan.  ? ?

## 2022-03-31 NOTE — Telephone Encounter (Signed)
Pt has been d/c warfarin and restarted xarelto. No further need for INR monitoring with coumadin clinic.  ?

## 2022-04-01 NOTE — Telephone Encounter (Signed)
DME has been signed and mailed to the pt.

## 2022-04-08 ENCOUNTER — Telehealth: Payer: Self-pay | Admitting: Internal Medicine

## 2022-04-13 ENCOUNTER — Other Ambulatory Visit: Payer: Self-pay | Admitting: Internal Medicine

## 2022-04-13 ENCOUNTER — Telehealth: Payer: Self-pay | Admitting: Internal Medicine

## 2022-04-13 DIAGNOSIS — E119 Type 2 diabetes mellitus without complications: Secondary | ICD-10-CM

## 2022-04-13 DIAGNOSIS — E1151 Type 2 diabetes mellitus with diabetic peripheral angiopathy without gangrene: Secondary | ICD-10-CM

## 2022-04-13 MED ORDER — TIRZEPATIDE 5 MG/0.5ML ~~LOC~~ SOAJ: 5.0000 mg | SUBCUTANEOUS | 0 refills | Status: DC

## 2022-04-13 NOTE — Telephone Encounter (Signed)
Jason [name] with home health is calling to report that patient's blood sugar readings for the past week have ranged from 63-157. If you need to speak with her, call back number is [phone].

## 2022-04-14 ENCOUNTER — Other Ambulatory Visit: Payer: Self-pay | Admitting: Internal Medicine

## 2022-04-14 DIAGNOSIS — E785 Hyperlipidemia, unspecified: Secondary | ICD-10-CM

## 2022-04-14 DIAGNOSIS — Z7984 Long term (current) use of oral hypoglycemic drugs: Secondary | ICD-10-CM | POA: Diagnosis not present

## 2022-04-14 DIAGNOSIS — E1159 Type 2 diabetes mellitus with other circulatory complications: Secondary | ICD-10-CM

## 2022-04-14 DIAGNOSIS — I4892 Unspecified atrial flutter: Secondary | ICD-10-CM

## 2022-04-14 DIAGNOSIS — Z794 Long term (current) use of insulin: Secondary | ICD-10-CM

## 2022-04-14 DIAGNOSIS — Z7985 Long-term (current) use of injectable non-insulin antidiabetic drugs: Secondary | ICD-10-CM

## 2022-04-20 ENCOUNTER — Telehealth: Payer: Self-pay | Admitting: Internal Medicine

## 2022-04-20 NOTE — Telephone Encounter (Signed)
Jason [name] has not gotten the monjaro - it is $259.  Patient is taking trujeo and blood sugar is averaging 104.  Please advise  Please call refills into the pharmacy for the free style libre sensors

## 2022-04-27 ENCOUNTER — Ambulatory Visit: Payer: Medicare HMO | Admitting: Internal Medicine

## 2022-04-27 ENCOUNTER — Telehealth: Payer: Self-pay

## 2022-04-27 ENCOUNTER — Other Ambulatory Visit: Payer: Self-pay | Admitting: Internal Medicine

## 2022-04-27 NOTE — Telephone Encounter (Signed)
April is calling requesting extension of Skilled nursing [facility]  1 time a week for 8 weeks  Blood range 58-169 reading most in the lower 100s  Not taking Mounjaro due to cost.  Doesn't have Bempedoic Acid-Ezetimibe (NEXLIZET) 180-10 MG TABS.  Refill on Continuous Blood Gluc Sensor (FREESTYLE LIBRE 2 SENSOR) [place], [place]

## 2022-04-28 NOTE — Telephone Encounter (Signed)
Verbal order to extend skilled nursing has been given to April.

## 2022-05-03 ENCOUNTER — Telehealth: Payer: Medicare PPO

## 2022-05-04 DIAGNOSIS — H52203 Unspecified astigmatism, bilateral: Secondary | ICD-10-CM | POA: Diagnosis not present

## 2022-05-04 DIAGNOSIS — Z7984 Long term (current) use of oral hypoglycemic drugs: Secondary | ICD-10-CM | POA: Diagnosis not present

## 2022-05-04 DIAGNOSIS — Z794 Long term (current) use of insulin: Secondary | ICD-10-CM | POA: Diagnosis not present

## 2022-05-04 DIAGNOSIS — H25813 Combined forms of age-related cataract, bilateral: Secondary | ICD-10-CM | POA: Diagnosis not present

## 2022-05-04 DIAGNOSIS — H5213 Myopia, bilateral: Secondary | ICD-10-CM | POA: Diagnosis not present

## 2022-05-04 DIAGNOSIS — H524 Presbyopia: Secondary | ICD-10-CM | POA: Diagnosis not present

## 2022-05-04 DIAGNOSIS — E1136 Type 2 diabetes mellitus with diabetic cataract: Secondary | ICD-10-CM | POA: Diagnosis not present

## 2022-05-20 NOTE — Progress Notes (Deleted)
LVM for pt to call back as soon as possible.  RE: Call back for appt

## 2022-06-01 ENCOUNTER — Telehealth: Payer: Self-pay

## 2022-06-01 ENCOUNTER — Ambulatory Visit (INDEPENDENT_AMBULATORY_CARE_PROVIDER_SITE_OTHER): Payer: Medicare HMO

## 2022-06-01 DIAGNOSIS — Z0000 Encounter for general adult medical examination without abnormal findings: Secondary | ICD-10-CM | POA: Diagnosis not present

## 2022-06-01 NOTE — Telephone Encounter (Signed)
Spoke with patient and AWV was completed.

## 2022-06-01 NOTE — Telephone Encounter (Signed)
Patient is requesting a rx for back brace due to a fall within the last 3 months where he fell and hit his back.  It is causing some pain with trying to stand up straight.  Also patient and wife requested a referral to neuro to evaluate dementia.  She is requesting testing.

## 2022-06-01 NOTE — Progress Notes (Signed)
I connected with Lestine Box today by telephone and verified that I am speaking with the correct person using two identifiers. Location patient: home Location provider: work Persons participating in the virtual visit: patient, provider.   I discussed the limitations, risks, security and privacy concerns of performing an evaluation and management service by telephone and the availability of in person appointments. I also discussed with the patient that there may be a patient responsible charge related to this service. The patient expressed understanding and verbally consented to this telephonic visit.    Interactive audio and video telecommunications were attempted between this provider and patient, however failed, due to patient having technical difficulties OR patient did not have access to video capability.  We continued and completed visit with audio only.  Some vital signs may be absent or patient reported.   Time Spent with patient on telephone encounter: 30 minutes  Subjective:   Jason [name] is a 81 y.o. male who presents for Medicare Annual/Subsequent preventive examination.  Review of Systems     Cardiac Risk Factors include: advanced age (>55mn, >>17women);diabetes mellitus;family history of premature cardiovascular disease;dyslipidemia;male gender;sedentary lifestyle;obesity (BMI >30kg/m2)     Objective:    There were no vitals filed for this visit. There is no height or weight on file to calculate BMI.     06/01/2022    2:10 PM 02/20/2022   12:02 PM 08/12/2021    7:49 AM 05/13/2021   11:30 AM 10/20/2020    9:29 AM 07/08/2020    3:55 PM 07/25/2019    9:05 AM  Advanced Directives  Does Patient Have a Medical Advance Directive? No No No No No No No  Would patient like information on creating a medical advance directive? No - Patient declined No - Patient declined No - Patient declined Yes (MAU/Ambulatory/Procedural Areas - Information given) Yes (MAU/Ambulatory/Procedural  Areas - Information given) No - Patient declined No - Patient declined    Current Medications (verified) Outpatient Encounter Medications as of 06/01/2022  Medication Sig   B-D UF III MINI PEN NEEDLES 31G X 5 MM MISC    Bempedoic Acid-Ezetimibe (NEXLIZET) 180-10 MG TABS Take 1 tablet by mouth daily. (Patient not taking: Reported on 03/30/2022)   Continuous Blood Gluc Receiver (FREESTYLE LIBRE 2 READER) DEVI 1 Act by Does not apply route daily.   Continuous Blood Gluc Sensor (FREESTYLE LIBRE 2 SENSOR) MISC 1 Act by Does not apply route daily.   Glucagon (GVOKE HYPOPEN 2-PACK) 1 MG/0.2ML SOAJ Inject 1 Act into the skin daily as needed.   insulin glargine, 2 Unit Dial, (TOUJEO MAX SOLOSTAR) 300 UNIT/ML Solostar Pen Inject 30 Units into the skin daily.   Insulin Pen Needle 32G X 6 MM MISC 1 Act by Does not apply route in the morning and at bedtime.   metFORMIN (GLUCOPHAGE) 500 MG tablet Take 2 tablets (1,000 mg total) by mouth 2 (two) times daily with a meal.   rivaroxaban (XARELTO) 20 MG TABS tablet Take 20 mg by mouth daily.   simvastatin (ZOCOR) 40 MG tablet Take 1 tablet (40 mg total) by mouth daily at 6 PM.   tirzepatide (MOUNJARO) 5 MG/0.5ML Pen Inject 5 mg into the skin once a week.   Wound Dressings (MEDIHONEY WOUND/BURN DRESSING) GEL Apply to affected are 3 times a week, and cover with sterile dressing. (Patient not taking: Reported on 03/30/2022)   No facility-administered encounter medications on file as of 06/01/2022.    Allergies (verified) Bactrim [sulfamethoxazole-trimethoprim]   History:  Past Medical History:  Diagnosis Date   Anxiety    Asthma    in past, no inhaler now   Benign neoplasm of colon    Bone infection ([place])    Cancer ([place])    bladder cancer   COPD (chronic obstructive pulmonary disease) (HCC)    Depression    Diverticulosis of colon (without mention of hemorrhage)    DJD (degenerative joint disease)    Esophageal reflux    Glaucoma    Hidradenitis     History of BPH    History of gynecomastia    Unilateral   Hyperlipidemia    Irritable bowel syndrome    Memory loss    Mitral stenosis    mild-moderate MS 01/2016   MVP (mitral valve prolapse)    Obesity, unspecified    Pneumonia    Sleep apnea    does not use  cpap or bipap .. no study  ever   Type II or unspecified type diabetes mellitus without mention of complication, not stated as uncontrolled    Unspecified venous (peripheral) insufficiency    Past Surgical History:  Procedure Laterality Date   ACHILLES TENDON SURGERY Right 05/17/2019   Procedure: ACHILLES LENGTHENING/KIDNER;  Surgeon: Evelina Bucy, DPM;  Location: [facility];  Service: Podiatry;  Laterality: Right;   AMPUTATION  08/09/2012   Procedure: AMPUTATION DIGIT;  Surgeon: Newt Minion, MD;  Location: [facility];  Service: Orthopedics;  Laterality: Right;  Amputation right Great Toe MTP Joint   AMPUTATION  10/17/2012   Procedure: AMPUTATION DIGIT;  Surgeon: Newt Minion, MD;  Location: [facility];  Service: Orthopedics;  Laterality: Right;  Right Foot 3rd Toe Amputation at Metatarsophalangeal Joint   AMPUTATION TOE Right 05/17/2019   Procedure: AMPUTATION TOE Metatarsal Phalangeal Joint   FOURTH AND FIFTH, FILLETED TOE FLAP;  Surgeon: Evelina Bucy, DPM;  Location: [facility];  Service: Podiatry;  Laterality: Right;   BUBBLE STUDY  08/12/2021   Procedure: BUBBLE STUDY;  Surgeon: Geralynn Rile, MD;  Location: [facility];  Service: Cardiovascular;;   CHOLECYSTECTOMY N/A 06/14/2018   Procedure: LAPAROSCOPIC CHOLECYSTECTOMY WITH INTRAOPERATIVE CHOLANGIOGRAM;  Surgeon: Mickeal Skinner, MD;  Location: [facility];  Service: General;  Laterality: N/A;   CIRCUMCISION  06/2100   For Phimosis - Dr Hartley Barefoot   COLON SURGERY     CYSTOSCOPY W/ RETROGRADES Bilateral 07/07/2018   Procedure: CYSTOSCOPY WITH BILATERAL RETROGRADE PYELOGRAM;  Surgeon: Ardis Hughs, MD;  Location: [facility];  Service: Urology;  Laterality: Bilateral;   KNEE  ARTHROSCOPY     LAPAROSCOPIC LYSIS OF ADHESIONS  06/14/2018   Procedure: LAPAROSCOPIC LYSIS OF ADHESIONS;  Surgeon: Kieth Brightly Arta Bruce, MD;  Location: [facility];  Service: General;;   LAPAROTOMY  07/20/11   LASER ABLATION  06/2009   Left Greater Saphenous vein -Dr Elicia Lamp OSTEOTOMY Right 05/17/2019   Procedure: METATARSLSECTOMY FOURTH DIGIT;  Surgeon: Evelina Bucy, DPM;  Location: [facility];  Service: Podiatry;  Laterality: Right;   MULTIPLE TOOTH EXTRACTIONS     TEE WITHOUT CARDIOVERSION N/A 08/12/2021   Procedure: TRANSESOPHAGEAL ECHOCARDIOGRAM (TEE);  Surgeon: Geralynn Rile, MD;  Location: [facility];  Service: Cardiovascular;  Laterality: N/A;   TOE AMPUTATION  06/2009   x3Right foot second toe -Dr Beola Cord   TONSILLECTOMY     TRANSURETHRAL RESECTION OF BLADDER TUMOR N/A 07/07/2018   Procedure: TRANSURETHRAL RESECTION OF BLADDER TUMOR (TURBT) WITH POST OP INSTILLATION OF GEMCITABIN;  Surgeon: Ardis Hughs, MD;  [facility];  Service: Urology;  Laterality: N/A;   WOUND DEBRIDEMENT Right 07/25/2019   Procedure: Right foot wound debridement with application of skin graft substitute;  Surgeon: Evelina Bucy, DPM;  Location[facility];  Service: Podiatry;  Laterality: Right;   Family History  Problem Relation Age of Onset   Diabetes Father    Cancer Father        Prostate   Stroke Father    Prostate cancer Father    High blood pressure Other    Colon cancer Neg Hx    Esophageal cancer Neg Hx    Rectal cancer Neg Hx    Stomach cancer Neg Hx    Social History   Socioeconomic History   Marital status: Married    Spouse name: Peter Congo   Number of children: 2   Years of education: Not on file   Highest education level: Not on file  Occupational History   Occupation: retired  Tobacco Use   Smoking status: Former    Packs/day: 0.10    Years: 5.00    Total pack years: 0.50    Types: Cigarettes    Quit date: 05/10/2015    Years since quitting: 7.0   Smokeless  tobacco: Never  Vaping Use   Vaping Use: Never used  Substance and Sexual Activity   Alcohol use: Not Currently    Alcohol/week: 7.0 standard drinks of alcohol    Types: 7 Cans of beer per week    Comment: social can of beer NONE RECENTLY   Drug use: No   Sexual activity: Not Currently  Other Topics Concern   Not on file  Social History Narrative   Not on file   Social Determinants of Health   Financial Resource Strain: Low Risk  (06/01/2022)   Overall Financial Resource Strain (CARDIA)    Difficulty of Paying Living Expenses: Not hard at all  Food Insecurity: No Food Insecurity (06/01/2022)   Hunger Vital Sign    Worried About Running Out of Food in the Last Year: Never true    Ran Out of Food in the Last Year: Never true  Transportation Needs: No Transportation Needs (06/01/2022)   PRAPARE - Hydrologist (Medical): No    Lack of Transportation (Non-Medical): No  Physical Activity: Sufficiently Active (06/01/2022)   Exercise Vital Sign    Days of Exercise per Week: 5 days    Minutes of Exercise per Session: 30 min  Stress: Stress Concern Present (06/01/2022)   [place]    Feeling of Stress : Rather much  Social Connections: Socially Integrated (06/01/2022)   Social Connection and Isolation Panel [NHANES]    Frequency of Communication with Friends and Family: More than three times a week    Frequency of Social Gatherings with Friends and Family: More than three times a week    Attends Religious Services: More than 4 times per year    Active Member of [employer] or Organizations: Yes    Attends Music therapist: More than 4 times per year    Marital Status: Married    Tobacco Counseling Counseling given: Not Answered   Clinical Intake:  Pre-visit preparation completed: Yes  Pain : No/denies pain     BMI - recorded: 30.21 Nutritional Status: BMI > 30   Obese Nutritional Risks: None Diabetes: Yes CBG done?: No Did pt. bring in CBG monitor from home?: No  How often do  you need to have someone help you when you read instructions, pamphlets, or other written materials from your doctor or pharmacy?: 1 - Never What is the last grade level you completed in school?: Bachelor's Degree  Diabetic? yes  Interpreter Needed?: No  Information entered by :: Lisette Abu, LPN.   Activities of Daily Living    06/01/2022    2:26 PM  In your present state of health, do you have any difficulty performing the following activities:  Hearing? 0  Vision? 0  Difficulty concentrating or making decisions? 1  Walking or climbing stairs? 1  Dressing or bathing? 0  Doing errands, shopping? 0  Preparing Food and eating ? N  Using the Toilet? N  In the past six months, have you accidently leaked urine? N  Do you have problems with loss of bowel control? N  Managing your Medications? N  Managing your Finances? N  Housekeeping or managing your Housekeeping? N    Patient Care Team: [name], MD as PCP - General (Internal Medicine) Minus Breeding, MD as PCP - Cardiology (Cardiology) Szabat, Darnelle Maffucci, [facility] (Inactive) (Pharmacist)  Indicate any recent Medical Services you may have received from other than [facility] providers in the past year (date may be approximate).     Assessment:   This is a routine wellness examination for [place].  Hearing/Vision screen Hearing Screening - Comments:: Patient denied any hearing difficulty.   No hearing aids.  Vision Screening - Comments:: Patient does wear readers.  Eye exam done by: Rutherford Guys, MD.   Dietary issues and exercise activities discussed: Current Exercise Habits: Home exercise routine, Type of exercise: walking, Frequency (Times/Week): 5, Intensity: Mild, Exercise limited by: orthopedic condition(s);cardiac condition(s)   Goals Addressed             This Visit's Progress    To  maintain the quality of my health.        Depression Screen    06/01/2022    2:15 PM 05/13/2021   11:30 AM 10/20/2020    9:29 AM 12/31/2019    1:56 PM 11/05/2019   11:22 AM 08/22/2019   11:17 AM 05/10/2019    2:58 PM  PHQ 2/9 Scores  PHQ - 2 Score '1 1 3 2 '$ 0 1 1  PHQ- 9 Score    7       Fall Risk    06/01/2022    2:11 PM 05/27/2021   11:30 AM 05/13/2021   11:30 AM 10/20/2020    9:29 AM 06/30/2020   11:08 AM  Fall Risk   Falls in the past year? 1  1 0 0  Comment  Denies new falls since last outreach 05/13/21     Number falls in past yr: 0  1  0  Comment   Reports approximately 6 falls over last 12 months- all without injury    Injury with Fall? 1  0  0  Comment hurt back during fall      Risk for fall due to :   History of fall(s)  Impaired balance/gait;Orthopedic patient  Follow up Falls prevention discussed Falls prevention discussed Falls prevention discussed;Education provided  Falls evaluation completed    FALL RISK PREVENTION PERTAINING TO THE HOME:  Any stairs in or around the home? No  If so, are there any without handrails? No  Home free of loose throw rugs in walkways, pet beds, electrical cords, etc? Yes  Adequate lighting in your home to reduce risk of falls? Yes  ASSISTIVE DEVICES UTILIZED TO PREVENT FALLS:  Life alert? No  Use of a cane, walker or w/c? No  Grab bars in the bathroom? Yes  Shower chair or bench in shower? Yes  Elevated toilet seat or a handicapped toilet? Yes   TIMED UP AND GO:  Was the test performed? No .  Length of time to ambulate 10 feet: n/a sec.   Appearance of gait: Gait not evaluated during this visit.  Cognitive Function:    06/01/2022    2:27 PM 04/13/2018    6:33 PM  MMSE - Mini Mental State Exam  Not completed: Unable to complete   Orientation to time  5  Orientation to Place  5  Registration  3  Attention/ Calculation  4  Recall  1  Language- name 2 objects  2  Language- repeat  1  Language- follow 3 step command  3   Language- read & follow direction  1  Write a sentence  1  Copy design  1  Total score  27        Immunizations Immunization History  Administered Date(s) Administered   Fluad Quad(high Dose 65+) 07/19/2019, 09/30/2020   Influenza Split 08/30/2011, 08/22/2012, 09/10/2015   Influenza Whole 10/24/2007, 08/26/2009   Influenza, High Dose Seasonal PF 07/12/2017   Influenza,inj,Quad PF,6+ Mos 08/20/2013, 08/09/2014, 08/23/2016   Influenza-Unspecified 09/12/2015, 10/18/2018   PFIZER(Purple Top)SARS-COV-2 Vaccination 12/04/2019, 01/03/2020, 08/28/2020   Pneumococcal Conjugate-13 02/18/2014   Pneumococcal Polysaccharide-23 08/06/2008, 09/13/2019   Td 02/03/2009   Tdap 09/13/2019   Zoster, Live 08/09/2014    TDAP status: Up to date  Flu Vaccine status: Up to date  Pneumococcal vaccine status: Up to date  Covid-19 vaccine status: Completed vaccines  Qualifies for Shingles Vaccine? Yes   Zostavax completed Yes   Shingrix Completed?: No.    Education has been provided regarding the importance of this vaccine. Patient has been advised to call insurance company to determine out of pocket expense if they have not yet received this vaccine. Advised may also receive vaccine at local pharmacy or Health Dept. Verbalized acceptance and understanding.  Screening Tests Health Maintenance  Topic Date Due   Zoster Vaccines- Shingrix (1 of 2) Never done   COLONOSCOPY (Pts 45-22yr Insurance coverage will need to be confirmed)  06/09/2018   COVID-19 Vaccine (4 - Booster for Pfizer series) 10/23/2020   OPHTHALMOLOGY EXAM  04/29/2022   INFLUENZA VACCINE  06/15/2022   HEMOGLOBIN A1C  09/16/2022   Diabetic kidney evaluation - GFR measurement  03/17/2023   Diabetic kidney evaluation - Urine ACR  03/17/2023   FOOT EXAM  03/17/2023   TETANUS/TDAP  09/12/2029   Pneumonia Vaccine 80 Years old  Completed   HPV VACCINES  Aged Out    Health Maintenance  Health Maintenance Due  Topic Date Due    Zoster Vaccines- Shingrix (1 of 2) Never done   COLONOSCOPY (Pts 45-453yrInsurance coverage will need to be confirmed)  06/09/2018   COVID-19 Vaccine (4 - Booster for Pfizer series) 10/23/2020   OPHTHALMOLOGY EXAM  04/29/2022    Colorectal cancer screening: No longer required.   Lung Cancer Screening: (Low Dose CT Chest recommended if Age 80-80ears, 30 pack-year currently smoking OR have quit w/in 15years.) does not qualify.   Lung Cancer Screening Referral: no  Additional Screening:  Hepatitis C Screening: does not qualify; Completed no  Vision Screening: Recommended annual ophthalmology exams for early detection of glaucoma and other disorders of the eye. Is the patient  up to date with their annual eye exam?  Yes  Who is the provider or what is the name of the office in which the patient attends annual eye exams? Rutherford Guys, MD. If pt is not established with a provider, would they like to be referred to a provider to establish care? No .   Dental Screening: Recommended annual dental exams for proper oral hygiene  Community Resource Referral / Chronic Care Management: CRR required this visit?  No   CCM required this visit?  No      Plan:     I have personally reviewed and noted the following in the patient's chart:   Medical and social history Use of alcohol, tobacco or illicit drugs  Current medications and supplements including opioid prescriptions. Patient is not currently taking opioid prescriptions. Functional ability and status Nutritional status Physical activity Advanced directives List of other physicians Hospitalizations, surgeries, and ER visits in previous 12 months Vitals Screenings to include cognitive, depression, and falls Referrals and appointments  In addition, I have reviewed and discussed with patient certain preventive protocols, quality metrics, and best practice recommendations. A written personalized care plan for preventive services as  well as general preventive health recommendations were provided to patient.     Sheral Flow, LPN   [id number]   Nurse Notes:  There were no vitals filed for this visit. There is no height or weight on file to calculate BMI.

## 2022-06-01 NOTE — Patient Instructions (Signed)
Jason [name] , Thank you for taking time to come for your Medicare Wellness Visit. I appreciate your ongoing commitment to your health goals. Please review the following plan we discussed and let me know if I can assist you in the future.   Screening recommendations/referrals: Colonoscopy: Discontinued due to age Recommended yearly ophthalmology/optometry visit for glaucoma screening and checkup Recommended yearly dental visit for hygiene and checkup  Vaccinations: Influenza vaccine: due 06/15/2022 Pneumococcal vaccine: 02/18/2014, 09/13/2019 Tdap vaccine: 09/13/2019; due every 10 years Shingles vaccine: never done Zoster vaccine: 08/09/2014   Covid-19: 12/04/2019, 01/02/2021, 08/28/2020  Advanced directives: No; patient has paperwork  Conditions/risks identified: Yes; Type II Diabetes  Next appointment: Please schedule your next Medicare Wellness Visit with your Nurse Health Advisor in 1 year by calling [phone].  Preventive Care 77 Years and Older, Male Preventive care refers to lifestyle choices and visits with your health care provider that can promote health and wellness. What does preventive care include? A yearly physical exam. This is also called an annual well check. Dental exams once or twice a year. Routine eye exams. Ask your health care provider how often you should have your eyes checked. Personal lifestyle choices, including: Daily care of your teeth and gums. Regular physical activity. Eating a healthy diet. Avoiding tobacco and drug use. Limiting alcohol use. Practicing safe sex. Taking low doses of aspirin every day. Taking vitamin and mineral supplements as recommended by your health care provider. What happens during an annual well check? The services and screenings done by your health care provider during your annual well check will depend on your age, overall health, lifestyle risk factors, and family history of disease. Counseling  Your health care provider may  ask you questions about your: Alcohol use. Tobacco use. Drug use. Emotional well-being. Home and relationship well-being. Sexual activity. Eating habits. History of falls. Memory and ability to understand (cognition). Work and work Statistician. Screening  You may have the following tests or measurements: Height, weight, and BMI. Blood pressure. Lipid and cholesterol levels. These may be checked every 5 years, or more frequently if you are over 6 years old. Skin check. Lung cancer screening. You may have this screening every year starting at age 84 if you have a 30-pack-year history of smoking and currently smoke or have quit within the past 15 years. Fecal occult blood test (FOBT) of the stool. You may have this test every year starting at age 73. Flexible sigmoidoscopy or colonoscopy. You may have a sigmoidoscopy every 5 years or a colonoscopy every 10 years starting at age 23. Prostate cancer screening. Recommendations will vary depending on your family history and other risks. Hepatitis C blood test. Hepatitis B blood test. Sexually transmitted disease (STD) testing. Diabetes screening. This is done by checking your blood sugar (glucose) after you have not eaten for a while (fasting). You may have this done every 1-3 years. Abdominal aortic aneurysm (AAA) screening. You may need this if you are a current or former smoker. Osteoporosis. You may be screened starting at age 55 if you are at high risk. Talk with your health care provider about your test results, treatment options, and if necessary, the need for more tests. Vaccines  Your health care provider may recommend certain vaccines, such as: Influenza vaccine. This is recommended every year. Tetanus, diphtheria, and acellular pertussis (Tdap, Td) vaccine. You may need a Td booster every 10 years. Zoster vaccine. You may need this after age 14. Pneumococcal 13-valent conjugate (PCV13) vaccine. One dose  is recommended after age  65. Pneumococcal polysaccharide (PPSV23) vaccine. One dose is recommended after age 7. Talk to your health care provider about which screenings and vaccines you need and how often you need them. This information is not intended to replace advice given to you by your health care provider. Make sure you discuss any questions you have with your health care provider. Document Released: 11/28/2015 Document Revised: 07/21/2016 Document Reviewed: 09/02/2015 Elsevier Interactive Patient Education  2017 [place] Prevention in the Home Falls can cause injuries. They can happen to people of all ages. There are many things you can do to make your home safe and to help prevent falls. What can I do on the outside of my home? Regularly fix the edges of walkways and driveways and fix any cracks. Remove anything that might make you trip as you walk through a door, such as a raised step or threshold. Trim any bushes or trees on the path to your home. Use bright outdoor lighting. Clear any walking paths of anything that might make someone trip, such as rocks or tools. Regularly check to see if handrails are loose or broken. Make sure that both sides of any steps have handrails. Any raised decks and porches should have guardrails on the edges. Have any leaves, snow, or ice cleared regularly. Use sand or salt on walking paths during winter. Clean up any spills in your garage right away. This includes oil or grease spills. What can I do in the bathroom? Use night lights. Install grab bars by the toilet and in the tub and shower. Do not use towel bars as grab bars. Use non-skid mats or decals in the tub or shower. If you need to sit down in the shower, use a plastic, non-slip stool. Keep the floor dry. Clean up any water that spills on the floor as soon as it happens. Remove soap buildup in the tub or shower regularly. Attach bath mats securely with double-sided non-slip rug tape. Do not have throw  rugs and other things on the floor that can make you trip. What can I do in the bedroom? Use night lights. Make sure that you have a light by your bed that is easy to reach. Do not use any sheets or blankets that are too big for your bed. They should not hang down onto the floor. Have a firm chair that has side arms. You can use this for support while you get dressed. Do not have throw rugs and other things on the floor that can make you trip. What can I do in the kitchen? Clean up any spills right away. Avoid walking on wet floors. Keep items that you use a lot in easy-to-reach places. If you need to reach something above you, use a strong step stool that has a grab bar. Keep electrical cords out of the way. Do not use floor polish or wax that makes floors slippery. If you must use wax, use non-skid floor wax. Do not have throw rugs and other things on the floor that can make you trip. What can I do with my stairs? Do not leave any items on the stairs. Make sure that there are handrails on both sides of the stairs and use them. Fix handrails that are broken or loose. Make sure that handrails are as long as the stairways. Check any carpeting to make sure that it is firmly attached to the stairs. Fix any carpet that is loose or worn. Avoid  having throw rugs at the top or bottom of the stairs. If you do have throw rugs, attach them to the floor with carpet tape. Make sure that you have a light switch at the top of the stairs and the bottom of the stairs. If you do not have them, ask someone to add them for you. What else can I do to help prevent falls? Wear shoes that: Do not have high heels. Have rubber bottoms. Are comfortable and fit you well. Are closed at the toe. Do not wear sandals. If you use a stepladder: Make sure that it is fully opened. Do not climb a closed stepladder. Make sure that both sides of the stepladder are locked into place. Ask someone to hold it for you, if  possible. Clearly mark and make sure that you can see: Any grab bars or handrails. First and last steps. Where the edge of each step is. Use tools that help you move around (mobility aids) if they are needed. These include: Canes. Walkers. Scooters. Crutches. Turn on the lights when you go into a dark area. Replace any light bulbs as soon as they burn out. Set up your furniture so you have a clear path. Avoid moving your furniture around. If any of your floors are uneven, fix them. If there are any pets around you, be aware of where they are. Review your medicines with your doctor. Some medicines can make you feel dizzy. This can increase your chance of falling. Ask your doctor what other things that you can do to help prevent falls. This information is not intended to replace advice given to you by your health care provider. Make sure you discuss any questions you have with your health care provider. Document Released: 08/28/2009 Document Revised: 04/08/2016 Document Reviewed: 12/06/2014 Elsevier Interactive Patient Education  2017 [employer].

## 2022-09-02 DIAGNOSIS — I739 Peripheral vascular disease, unspecified: Secondary | ICD-10-CM | POA: Diagnosis not present

## 2022-09-02 DIAGNOSIS — E785 Hyperlipidemia, unspecified: Secondary | ICD-10-CM | POA: Diagnosis not present

## 2022-09-02 DIAGNOSIS — F1290 Cannabis use, unspecified, uncomplicated: Secondary | ICD-10-CM | POA: Diagnosis not present

## 2022-09-02 DIAGNOSIS — F17210 Nicotine dependence, cigarettes, uncomplicated: Secondary | ICD-10-CM | POA: Diagnosis not present

## 2022-09-02 DIAGNOSIS — E1169 Type 2 diabetes mellitus with other specified complication: Secondary | ICD-10-CM | POA: Diagnosis not present

## 2022-09-02 DIAGNOSIS — Z79899 Other long term (current) drug therapy: Secondary | ICD-10-CM | POA: Diagnosis not present

## 2022-09-02 DIAGNOSIS — G4733 Obstructive sleep apnea (adult) (pediatric): Secondary | ICD-10-CM | POA: Diagnosis not present

## 2022-09-02 DIAGNOSIS — I4891 Unspecified atrial fibrillation: Secondary | ICD-10-CM | POA: Diagnosis not present

## 2022-09-02 DIAGNOSIS — I509 Heart failure, unspecified: Secondary | ICD-10-CM | POA: Diagnosis not present

## 2022-09-08 ENCOUNTER — Telehealth: Payer: Self-pay

## 2022-09-08 NOTE — Telephone Encounter (Signed)
Mailed Novo Nordisk renewal application to patient home. 

## 2022-09-08 NOTE — Telephone Encounter (Signed)
error 

## 2022-09-08 NOTE — Telephone Encounter (Signed)
Error

## 2022-09-10 ENCOUNTER — Telehealth: Payer: Self-pay

## 2022-09-10 NOTE — Telephone Encounter (Signed)
Mailed SANOFI renewal application to patient home.  Carlene Bickley L. CPhT Rx Patient Advocate   

## 2022-09-13 DIAGNOSIS — E1151 Type 2 diabetes mellitus with diabetic peripheral angiopathy without gangrene: Secondary | ICD-10-CM | POA: Diagnosis not present

## 2022-09-13 DIAGNOSIS — E11621 Type 2 diabetes mellitus with foot ulcer: Secondary | ICD-10-CM | POA: Diagnosis not present

## 2022-09-29 NOTE — Telephone Encounter (Signed)
Pt patient assist ozempic from [place] arrived  [place] pt but call could not be completed at this time, to call back later

## 2022-10-04 NOTE — Telephone Encounter (Signed)
Called pt about ozempic but call can not be completed this

## 2022-10-04 NOTE — Telephone Encounter (Signed)
Can ot be completed at this time

## 2022-10-17 NOTE — Progress Notes (Unsigned)
Cardiology Office Note   Date:  10/18/2022   ID:  Jason [name], DOB 01-18-1941, MRN [id number]  PCP:  [name], MD  Cardiologist:   Minus Breeding, MD   Chief Complaint  Patient presents with   Mitral Stenosis     History of Present Illness: Jason [name] is a 81 y.o. male who I saw in 2017.  He had MVP and moderate MR and MS. at the last visit he was noted to have an EKG consistent with flutter waves.  I had him wear a monitor and he does he had flutter 100% of the time but with controlled rate.   I repeated an echo last year.  The MS appears to be mod/severe as it was before with no change in mean gradient although the MV area by PHT is reduced.    TEE was done in Sept 2022 and he had severe MS.  However, he would not return our calls.  He misses scheduled follow up .  We sent letters for follow up.    He comes back today finally for an appointment with [place].  About his wife into the room to discuss our options.  He is medically noncompliant currently.  He does not take I do not think any of his medicines.  She says he is not walking.  He did have a fall and so he is worked with physical therapy and knows how to balance himself despite his previous toe amputations.  He could walk.  He is not getting any acute dyspnea walking across the house.  He is not having any classic PND or orthopnea.  He has sleep apnea but he will not wear his CPAP.  He is not having any palpitations, presyncope or syncope.  He is not having any chest pressure, neck or arm discomfort.  He had no weight gain or edema.  I think   Past Medical History:  Diagnosis Date   Anxiety    Asthma    in past, no inhaler now   Benign neoplasm of colon    Bone infection ([place])    Cancer ([place])    bladder cancer   COPD (chronic obstructive pulmonary disease) (H[facility])    Depression    Diverticulosis of colon (without mention of hemorrhage)    DJD (degenerative joint disease)    Esophageal reflux    Glaucoma     Hidradenitis    History of BPH    History of gynecomastia    Unilateral   Hyperlipidemia    Irritable bowel syndrome    Memory loss    Mitral stenosis    mild-moderate MS 01/2016   MVP (mitral valve prolapse)    Obesity, unspecified    Pneumonia    Sleep apnea    does not use  cpap or bipap .. no study  ever   Type II or unspecified type diabetes mellitus without mention of complication, not stated as uncontrolled    Unspecified venous (peripheral) insufficiency     Past Surgical History:  Procedure Laterality Date   ACHILLES TENDON SURGERY Right 05/17/2019   Procedure: ACHILLES LENGTHENING/KIDNER;  Surgeon: Evelina Bucy, DPM;  Location: [facility];  Service: Podiatry;  Laterality: Right;   AMPUTATION  08/09/2012   Procedure: AMPUTATION DIGIT;  Surgeon: Newt Minion, MD;  Location: [facility];  Service: Orthopedics;  Laterality: Right;  Amputation right Great Toe MTP Joint   AMPUTATION  10/17/2012   Procedure: AMPUTATION DIGIT;  Surgeon:  Newt Minion, MD;  Location: [facility];  Service: Orthopedics;  Laterality: Right;  Right Foot 3rd Toe Amputation at Metatarsophalangeal Joint   AMPUTATION TOE Right 05/17/2019   Procedure: AMPUTATION TOE Metatarsal Phalangeal Joint   FOURTH AND FIFTH, FILLETED TOE FLAP;  Surgeon: Evelina Bucy, DPM;  Location: [facility];  Service: Podiatry;  Laterality: Right;   BUBBLE STUDY  08/12/2021   Procedure: BUBBLE STUDY;  Surgeon: Geralynn Rile, MD;  Location: [facility];  Service: Cardiovascular;;   CHOLECYSTECTOMY N/A 06/14/2018   Procedure: LAPAROSCOPIC CHOLECYSTECTOMY WITH INTRAOPERATIVE CHOLANGIOGRAM;  Surgeon: Mickeal Skinner, MD;  Location: [facility];  Service: General;  Laterality: N/A;   CIRCUMCISION  06/2100   For Phimosis - Dr Hartley Barefoot   COLON SURGERY     CYSTOSCOPY W/ RETROGRADES Bilateral 07/07/2018   Procedure: CYSTOSCOPY WITH BILATERAL RETROGRADE PYELOGRAM;  Surgeon: Ardis Hughs, MD;  Location: [facility];  Service: Urology;  Laterality:  Bilateral;   KNEE ARTHROSCOPY     LAPAROSCOPIC LYSIS OF ADHESIONS  06/14/2018   Procedure: LAPAROSCOPIC LYSIS OF ADHESIONS;  Surgeon: Kieth Brightly Arta Bruce, MD;  Location: [facility];  Service: General;;   LAPAROTOMY  07/20/11   LASER ABLATION  06/2009   Left Greater Saphenous vein -Dr Elicia Lamp OSTEOTOMY Right 05/17/2019   Procedure: METATARSLSECTOMY FOURTH DIGIT;  Surgeon: Evelina Bucy, DPM;  Location: [facility];  Service: Podiatry;  Laterality: Right;   MULTIPLE TOOTH EXTRACTIONS     TEE WITHOUT CARDIOVERSION N/A 08/12/2021   Procedure: TRANSESOPHAGEAL ECHOCARDIOGRAM (TEE);  Surgeon: Geralynn Rile, MD;  Location: [facility];  Service: Cardiovascular;  Laterality: N/A;   TOE AMPUTATION  06/2009   x3Right foot second toe -Dr Beola Cord   TONSILLECTOMY     TRANSURETHRAL RESECTION OF BLADDER TUMOR N/A 07/07/2018   Procedure: TRANSURETHRAL RESECTION OF BLADDER TUMOR (TURBT) WITH POST OP INSTILLATION OF GEMCITABIN;  Surgeon: Ardis Hughs, MD;  Location[facility];  Service: Urology;  Laterality: N/A;   WOUND DEBRIDEMENT Right 07/25/2019   Procedure: Right foot wound debridement with application of skin graft substitute;  Surgeon: Evelina Bucy, DPM;  Location[facility];  Service: Podiatry;  Laterality: Right;     Current Outpatient Medications  Medication Sig Dispense Refill   B-D UF III MINI PEN NEEDLES 31G X 5 MM MISC      Bempedoic Acid-Ezetimibe (NEXLIZET) 180-10 MG TABS Take 1 tablet by mouth daily. 90 tablet 3   Continuous Blood Gluc Receiver (FREESTYLE LIBRE 2 READER) DEVI 1 Act by Does not apply route daily. 2 each 5   Continuous Blood Gluc Sensor (FREESTYLE LIBRE 2 SENSOR) MISC 1 Act by Does not apply route daily. 2 each 5   Glucagon (GVOKE HYPOPEN 2-PACK) 1 MG/0.2ML SOAJ Inject 1 Act into the skin daily as needed. 2 mL 5   insulin glargine, 2 Unit Dial, (TOUJEO MAX SOLOSTAR) 300 UNIT/ML Solostar Pen Inject 30 Units into the skin daily. 12 mL 0   Insulin Pen Needle 32G X 6  MM MISC 1 Act by Does not apply route in the morning and at bedtime. 100 each 0   metFORMIN (GLUCOPHAGE) 500 MG tablet Take 2 tablets (1,000 mg total) by mouth 2 (two) times daily with a meal. 360 tablet 0   rivaroxaban (XARELTO) 20 MG TABS tablet Take 1 tablet (20 mg total) by mouth daily. 90 tablet 3   simvastatin (ZOCOR) 40 MG tablet Take 1 tablet (40 mg total) by mouth daily at 6 PM.  90 tablet 3   tirzepatide (MOUNJARO) 5 MG/0.5ML Pen Inject 5 mg into the skin once a week. 4 mL 0   Wound Dressings (MEDIHONEY WOUND/BURN DRESSING) GEL Apply to affected are 3 times a week, and cover with sterile dressing. (Patient not taking: Reported on 03/30/2022) 15 mL 2   No current facility-administered medications for this visit.    Allergies:   Bactrim [sulfamethoxazole-trimethoprim]    ROS:  Please see the history of present illness.   Otherwise, review of systems are positive for none.   All other systems are reviewed and negative.    PHYSICAL EXAM: VS:  BP (!) 120/54   Ht '6\' 8"'$  (2.032 m)   Wt 271 lb 6.4 oz (123.1 kg)   SpO2 92%   BMI 29.81 kg/m  , BMI Body mass index is 29.81 kg/m. GENERAL:  Well appearing NECK:  No jugular venous distention, waveform within normal limits, carotid upstroke brisk and symmetric, no bruits, no thyromegaly LUNGS:  Clear to auscultation bilaterally CHEST:  Unremarkable HEART:  PMI not displaced or sustained,S1 and S2 within normal limits, no S3, no S4, no clicks, no rubs, 2 out of 6 systolic murmur heard at the apex and nonradiating, no diastolic murmurs ABD:  Flat, positive bowel sounds normal in frequency in pitch, no bruits, no rebound, no guarding, no midline pulsatile mass, no hepatomegaly, no splenomegaly EXT:  2 plus pulses throughout, no edema, no cyanosis no clubbing   EKG:  EKG is ordered today. Normal sinus rhythm, rate 72, first-degree AV block, early transition lead V2, no acute ST-T wave changes.    Recent Labs: 03/16/2022: ALT 16; BUN 10;  Creatinine, Ser 1.18; Hemoglobin 13.4; Platelets 175.0; Potassium 4.0; Sodium 135    Lipid Panel    Component Value Date/Time   CHOL 211 (H) 03/16/2022 0835   TRIG 93.0 03/16/2022 0835   HDL 54.20 03/16/2022 0835   CHOLHDL 4 03/16/2022 0835   VLDL 18.6 03/16/2022 0835   LDLCALC 138 (H) 03/16/2022 0835   LDLCALC 79 05/21/2020 1458      Wt Readings from Last 3 Encounters:  10/18/22 271 lb 6.4 oz (123.1 kg)  03/16/22 275 lb (124.7 kg)  02/25/22 273 lb (123.8 kg)      Other studies Reviewed: Additional studies/ records that were reviewed today include: Previous primary care records labs. Review of the above records demonstrates: See elsewhere   ASSESSMENT AND PLAN:  Mitral stenosis: He has severe stenosis and regurgitation.  He was not responsive to any of our attempts to get in contact with him.  He is going to need to make a decision about valve surgery or not.  We had a frank and long discussion about this.  He would want to have surgery if he is a candidate.  I told him he needs to get his blood sugar controlled.  He needs to be compliant with medications and follow-up visits.   He is to restart the medicines as listed.  He is to start being more ambulatory.  I am going to get him to see an oral surgeon to have his remaining teeth removed.  If he complies with meds, follow-up, ambulation, oral surgery follow-up he might be a candidate for mitral valve surgery.   ATRIAL FLUTTER: He is in sinus rhythm with first-degree AV block.  He is to restart the Xarelto.  He has no contraindications.  Hyperlipidemia: 138 HDL of 54.  He is not taking any medications and is encouraged to restart including  bempedoic acid and simvastatin.     DM2:   A1c was greater than 11 and he understands he would not be a surgical candidate with uncontrolled diabetes and medical nonadherence.   Current medicines are reviewed at length with the patient today.  The patient does not have concerns regarding  medicines.  The following changes have been made:  As above  Labs/ tests ordered today include:    Orders Placed This Encounter  Procedures   Ambulatory referral to Oral Maxillofacial Surgery   EKG 12-Lead    Disposition:   FU with APP in 2 months.   Signed, Minus Breeding, MD  10/18/2022 9:20 PM    [facility]

## 2022-10-18 ENCOUNTER — Ambulatory Visit: Payer: Medicare HMO | Attending: Cardiology | Admitting: Cardiology

## 2022-10-18 ENCOUNTER — Encounter: Payer: Self-pay | Admitting: Cardiology

## 2022-10-18 VITALS — BP 120/54 | Ht >= 80 in | Wt 271.4 lb

## 2022-10-18 DIAGNOSIS — I342 Nonrheumatic mitral (valve) stenosis: Secondary | ICD-10-CM | POA: Diagnosis not present

## 2022-10-18 DIAGNOSIS — E785 Hyperlipidemia, unspecified: Secondary | ICD-10-CM | POA: Diagnosis not present

## 2022-10-18 DIAGNOSIS — I739 Peripheral vascular disease, unspecified: Secondary | ICD-10-CM | POA: Diagnosis not present

## 2022-10-18 DIAGNOSIS — I4892 Unspecified atrial flutter: Secondary | ICD-10-CM | POA: Diagnosis not present

## 2022-10-18 MED ORDER — NEXLIZET 180-10 MG PO TABS: 1.0000 | ORAL_TABLET | Freq: Every day | ORAL | 3 refills | Status: DC

## 2022-10-18 MED ORDER — SIMVASTATIN 40 MG PO TABS: 40.0000 mg | ORAL_TABLET | Freq: Every day | ORAL | 3 refills | Status: DC

## 2022-10-18 MED ORDER — RIVAROXABAN 20 MG PO TABS: 20.0000 mg | ORAL_TABLET | Freq: Every day | ORAL | 3 refills | Status: AC

## 2022-10-18 NOTE — Patient Instructions (Signed)
Medication Instructions:  Your physician recommends that you continue on your current medications as directed. Please refer to the Current Medication list given to you today.   Restart all medications  *If you need a refill on your cardiac medications before your next appointment, please call your pharmacy*   Lab Work: none If you have labs (blood work) drawn today and your tests are completely normal, you will receive your results only by: [place] (if you have MyChart) OR A paper copy in the mail If you have any lab test that is abnormal or we need to change your treatment, we will call you to review the results.   Testing/Procedures: none   Follow-Up: At [facility], you and your health needs are our priority.  As part of our continuing mission to provide you with exceptional heart care, we have created designated Provider Care Teams.  These Care Teams include your primary Cardiologist (physician) and Advanced Practice Providers (APPs -  Physician Assistants and Nurse Practitioners) who all work together to provide you with the care you need, when you need it.  We recommend signing up for the patient portal called "MyChart".  Sign up information is provided on this After Visit Summary.  MyChart is used to connect with patients for Virtual Visits (Telemedicine).  Patients are able to view lab/test results, encounter notes, upcoming appointments, etc.  Non-urgent messages can be sent to your provider as well.   To learn more about what you can do with MyChart, go to [email or web address].    Your next appointment:   2 month(s)  The format for your next appointment:   In Person  Provider:   Available APP    Other Instructions   Dr. Benson Norway, oral surgeon [place], [place] [address] [name] OFFICE PHONE NUMBER [phone]     Important Information About Sugar

## 2022-10-20 DIAGNOSIS — L97512 Non-pressure chronic ulcer of other part of right foot with fat layer exposed: Secondary | ICD-10-CM | POA: Diagnosis not present

## 2022-10-20 DIAGNOSIS — M792 Neuralgia and neuritis, unspecified: Secondary | ICD-10-CM | POA: Diagnosis not present

## 2022-10-20 DIAGNOSIS — I739 Peripheral vascular disease, unspecified: Secondary | ICD-10-CM | POA: Diagnosis not present

## 2022-10-20 DIAGNOSIS — E1142 Type 2 diabetes mellitus with diabetic polyneuropathy: Secondary | ICD-10-CM | POA: Diagnosis not present

## 2022-10-26 ENCOUNTER — Other Ambulatory Visit: Payer: Self-pay | Admitting: *Deleted

## 2022-10-26 DIAGNOSIS — I739 Peripheral vascular disease, unspecified: Secondary | ICD-10-CM

## 2022-10-29 DIAGNOSIS — R413 Other amnesia: Secondary | ICD-10-CM | POA: Diagnosis not present

## 2022-10-29 DIAGNOSIS — I209 Angina pectoris, unspecified: Secondary | ICD-10-CM | POA: Diagnosis not present

## 2022-10-29 DIAGNOSIS — E1140 Type 2 diabetes mellitus with diabetic neuropathy, unspecified: Secondary | ICD-10-CM | POA: Diagnosis not present

## 2022-10-29 DIAGNOSIS — Z23 Encounter for immunization: Secondary | ICD-10-CM | POA: Diagnosis not present

## 2022-10-29 DIAGNOSIS — Z79899 Other long term (current) drug therapy: Secondary | ICD-10-CM | POA: Diagnosis not present

## 2022-10-29 DIAGNOSIS — E11621 Type 2 diabetes mellitus with foot ulcer: Secondary | ICD-10-CM | POA: Diagnosis not present

## 2022-10-29 DIAGNOSIS — E1159 Type 2 diabetes mellitus with other circulatory complications: Secondary | ICD-10-CM | POA: Diagnosis not present

## 2022-10-29 DIAGNOSIS — E1169 Type 2 diabetes mellitus with other specified complication: Secondary | ICD-10-CM | POA: Diagnosis not present

## 2022-10-29 DIAGNOSIS — E11638 Type 2 diabetes mellitus with other oral complications: Secondary | ICD-10-CM | POA: Diagnosis not present

## 2022-11-02 ENCOUNTER — Ambulatory Visit (HOSPITAL_COMMUNITY): Payer: Medicare HMO

## 2022-11-02 ENCOUNTER — Ambulatory Visit: Payer: Medicare HMO | Admitting: Vascular Surgery

## 2022-11-03 ENCOUNTER — Ambulatory Visit (HOSPITAL_COMMUNITY): Payer: Medicare HMO

## 2022-11-12 DIAGNOSIS — E11621 Type 2 diabetes mellitus with foot ulcer: Secondary | ICD-10-CM | POA: Diagnosis not present

## 2022-11-12 DIAGNOSIS — E1151 Type 2 diabetes mellitus with diabetic peripheral angiopathy without gangrene: Secondary | ICD-10-CM | POA: Diagnosis not present

## 2022-11-16 ENCOUNTER — Ambulatory Visit: Payer: Medicare HMO | Admitting: Vascular Surgery

## 2022-11-18 ENCOUNTER — Telehealth: Payer: Self-pay

## 2022-11-18 ENCOUNTER — Ambulatory Visit: Payer: Medicare HMO | Admitting: Vascular Surgery

## 2022-11-18 ENCOUNTER — Ambulatory Visit (HOSPITAL_COMMUNITY)
Admission: RE | Admit: 2022-11-18 | Discharge: 2022-11-18 | Disposition: A | Discharge status: Home / Self Care | Payer: Medicare HMO | Source: Ambulatory Visit | Attending: Vascular Surgery | Admitting: Vascular Surgery

## 2022-11-18 ENCOUNTER — Encounter: Payer: Self-pay | Admitting: Vascular Surgery

## 2022-11-18 VITALS — BP 143/84 | HR 50 | Temp 98.0°F | Resp 20 | Ht >= 80 in | Wt 269.0 lb

## 2022-11-18 DIAGNOSIS — I739 Peripheral vascular disease, unspecified: Secondary | ICD-10-CM

## 2022-11-18 NOTE — Progress Notes (Signed)
ASSESSMENT & PLAN   PERIPHERAL ARTERIAL DISEASE: This patient has evidence of mild infrainguinal arterial occlusive disease.  However he has multiphasic signals in both feet and I do not see that he has any significant disease which would be a contraindication to his planned mitral valve repair.  I encouraged him to stay as active as possible.  He quit tobacco in 2016.  I do not think he needs routine follow-up unless he develops disabling claudication, rest pain, or nonhealing ulcers.  CHRONIC VENOUS INSUFFICIENCY: He does have hyperpigmentation of both legs consistent with chronic venous insufficiency.  I instructed him to elevate his legs and try to keep the skin well lubricated.  I would not recommend formal venous reflux testing unless he develops significant swelling or symptoms from venous hypertension.  He does wear compression stockings.  REASON FOR CONSULT:    Peripheral arterial disease.  The consult is requested by Dr. Cipriano Mile.   HPI:   Jason [name] is a 82 y.o. male who was referred for evaluation of peripheral arterial disease.  I have reviewed the records from the referring office.  The patient was seen on 09/28/2022.  Patient does have a history of hyperlipidemia paroxysmal atrial fibrillation type 2 diabetes and tobacco abuse.  On my history, the patient denies significant calf claudication.  His activity is somewhat limited.  He does have severe mitral stenosis and is being considered for mitral valve repair.  He denies any history of rest pain or nonhealing ulcers.  His risk factors for peripheral arterial disease include diabetes, hypertension, hypercholesterolemia, and a history of tobacco use.  He quit in 2016.  He denies any family history of premature cardiovascular disease.  He is not on aspirin as he is on Xarelto.  He is on a statin.  He is on Xarelto for A-fib.  Past Medical History:  Diagnosis Date   Anxiety    Asthma    in past, no inhaler now    Benign neoplasm of colon    Bone infection ([place])    Cancer ([place])    bladder cancer   COPD (chronic obstructive pulmonary disease) ([facility])    Depression    Diverticulosis of colon (without mention of hemorrhage)    DJD (degenerative joint disease)    Esophageal reflux    Glaucoma    Hidradenitis    History of BPH    History of gynecomastia    Unilateral   Hyperlipidemia    Irritable bowel syndrome    Memory loss    Mitral stenosis    mild-moderate MS 01/2016   MVP (mitral valve prolapse)    Obesity, unspecified    Pneumonia    Sleep apnea    does not use  cpap or bipap .. no study  ever   Type II or unspecified type diabetes mellitus without mention of complication, not stated as uncontrolled    Unspecified venous (peripheral) insufficiency     Family History  Problem Relation Age of Onset   Diabetes Father    Cancer Father        Prostate   Stroke Father    Prostate cancer Father    High blood pressure Other    Colon cancer Neg Hx    Esophageal cancer Neg Hx    Rectal cancer Neg Hx    Stomach cancer Neg Hx     SOCIAL HISTORY: Social History   Tobacco Use   Smoking status: Former    Packs/day: 0.10  Years: 5.00    Total pack years: 0.50    Types: Cigarettes    Quit date: 05/10/2015    Years since quitting: 7.5   Smokeless tobacco: Never  Substance Use Topics   Alcohol use: Not Currently    Alcohol/week: 7.0 standard drinks of alcohol    Types: 7 Cans of beer per week    Comment: social can of beer NONE RECENTLY    Allergies  Allergen Reactions   Bactrim [Sulfamethoxazole-Trimethoprim] Rash    Current Outpatient Medications  Medication Sig Dispense Refill   B-D UF III MINI PEN NEEDLES 31G X 5 MM MISC      Bempedoic Acid-Ezetimibe (NEXLIZET) 180-10 MG TABS Take 1 tablet by mouth daily. 90 tablet 3   Continuous Blood Gluc Receiver (FREESTYLE LIBRE 2 READER) DEVI 1 Act by Does not apply route daily. 2 each 5   Continuous Blood Gluc Sensor (FREESTYLE  LIBRE 2 SENSOR) MISC 1 Act by Does not apply route daily. 2 each 5   Glucagon (GVOKE HYPOPEN 2-PACK) 1 MG/0.2ML SOAJ Inject 1 Act into the skin daily as needed. 2 mL 5   insulin glargine, 2 Unit Dial, (TOUJEO MAX SOLOSTAR) 300 UNIT/ML Solostar Pen Inject 30 Units into the skin daily. 12 mL 0   Insulin Pen Needle 32G X 6 MM MISC 1 Act by Does not apply route in the morning and at bedtime. 100 each 0   metFORMIN (GLUCOPHAGE) 500 MG tablet Take 2 tablets (1,000 mg total) by mouth 2 (two) times daily with a meal. 360 tablet 0   rivaroxaban (XARELTO) 20 MG TABS tablet Take 1 tablet (20 mg total) by mouth daily. 90 tablet 3   simvastatin (ZOCOR) 40 MG tablet Take 1 tablet (40 mg total) by mouth daily at 6 PM. 90 tablet 3   tirzepatide (MOUNJARO) 5 MG/0.5ML Pen Inject 5 mg into the skin once a week. 4 mL 0   Wound Dressings (MEDIHONEY WOUND/BURN DRESSING) GEL Apply to affected are 3 times a week, and cover with sterile dressing. 15 mL 2   No current facility-administered medications for this visit.    REVIEW OF SYSTEMS:  '[X]'$  denotes positive finding, '[ ]'$  denotes negative finding Cardiac  Comments:  Chest pain or chest pressure: x   Shortness of breath upon exertion: x   Short of breath when lying flat:    Irregular heart rhythm: x       Vascular    Pain in calf, thigh, or hip brought on by ambulation:    Pain in feet at night that wakes you up from your sleep:  x   Blood clot in your veins:    Leg swelling:  x       Pulmonary    Oxygen at home:    Productive cough:  x   Wheezing:         Neurologic    Sudden weakness in arms or legs:     Sudden numbness in arms or legs:     Sudden onset of difficulty speaking or slurred speech:    Temporary loss of vision in one eye:     Problems with dizziness:  x       Gastrointestinal    Blood in stool:     Vomited blood:         Genitourinary    Burning when urinating:     Blood in urine:        Psychiatric    Major depression:  Hematologic    Bleeding problems:    Problems with blood clotting too easily:        Skin    Rashes or ulcers:        Constitutional    Fever or chills:    -  PHYSICAL EXAM:   Vitals:   11/18/22 1528  BP: (!) 143/84  Pulse: (!) 50  Resp: 20  Temp: 98 F (36.7 C)  SpO2: (!) 50%  Weight: 269 lb (122 kg)  Height: '6\' 8"'$  (2.032 m)   Body mass index is 29.55 kg/m. GENERAL: The patient is a well-nourished male, in no acute distress. The vital signs are documented above. CARDIAC: There is a regular rate and rhythm.  VASCULAR: I do not detect carotid bruits. On the right side he has a palpable femoral pulse and posterior tibial pulse.  His transmetatarsal amputation site is healed. On the left side he has a palpable femoral pulse.  I cannot palpate pedal pulses because of his mild leg swelling and significantly thickened skin. He has hyperpigmentation bilaterally consistent with chronic venous insufficiency. PULMONARY: There is good air exchange bilaterally without wheezing or rales. ABDOMEN: Soft and non-tender with normal pitched bowel sounds.  I do not palpate an aneurysm. MUSCULOSKELETAL: There are no major deformities. NEUROLOGIC: No focal weakness or paresthesias are detected. SKIN: There are no ulcers or rashes noted. PSYCHIATRIC: The patient has a normal affect.  DATA:    ARTERIAL DOPPLER STUDY: I have independently interpreted his arterial Doppler study today.  On the right side there is a triphasic posterior tibial signal with a biphasic dorsalis pedis signal.  ABIs 100%.  Toe pressures 169 mmHg.  On the left side there is a biphasic posterior tibial and dorsalis pedis signal.  The arteries are calcified and ABIs not reliable on the left.  Toe pressure could not be obtained as he had a transmetatarsal amputation.  LABS: I have reviewed the labs from the referring office.  On 09/02/2022 triglycerides was 81.  LDL cholesterol 79.  Creatinine was 1.08 with a GFR of 69.   Hemoglobin was 13.1.  Platelets 205,000.  Deitra Mayo [facility]

## 2022-11-18 NOTE — Telephone Encounter (Signed)
Approved Coverage Starts on: 11/15/2022 12:00:00 AM, Coverage Ends on: 11/15/2023

## 2022-11-18 NOTE — Telephone Encounter (Signed)
Key: [id number]

## 2022-11-25 DIAGNOSIS — I739 Peripheral vascular disease, unspecified: Secondary | ICD-10-CM | POA: Diagnosis not present

## 2022-11-25 DIAGNOSIS — M792 Neuralgia and neuritis, unspecified: Secondary | ICD-10-CM | POA: Diagnosis not present

## 2022-11-25 DIAGNOSIS — E1142 Type 2 diabetes mellitus with diabetic polyneuropathy: Secondary | ICD-10-CM | POA: Diagnosis not present

## 2022-11-25 DIAGNOSIS — L97512 Non-pressure chronic ulcer of other part of right foot with fat layer exposed: Secondary | ICD-10-CM | POA: Diagnosis not present

## 2022-12-01 DIAGNOSIS — E559 Vitamin D deficiency, unspecified: Secondary | ICD-10-CM | POA: Diagnosis not present

## 2022-12-01 DIAGNOSIS — E1151 Type 2 diabetes mellitus with diabetic peripheral angiopathy without gangrene: Secondary | ICD-10-CM | POA: Diagnosis not present

## 2022-12-01 DIAGNOSIS — Z0001 Encounter for general adult medical examination with abnormal findings: Secondary | ICD-10-CM | POA: Diagnosis not present

## 2022-12-01 DIAGNOSIS — Z136 Encounter for screening for cardiovascular disorders: Secondary | ICD-10-CM | POA: Diagnosis not present

## 2022-12-01 DIAGNOSIS — E11621 Type 2 diabetes mellitus with foot ulcer: Secondary | ICD-10-CM | POA: Diagnosis not present

## 2022-12-01 DIAGNOSIS — D6859 Other primary thrombophilia: Secondary | ICD-10-CM | POA: Diagnosis not present

## 2022-12-01 DIAGNOSIS — Z125 Encounter for screening for malignant neoplasm of prostate: Secondary | ICD-10-CM | POA: Diagnosis not present

## 2022-12-01 DIAGNOSIS — E1140 Type 2 diabetes mellitus with diabetic neuropathy, unspecified: Secondary | ICD-10-CM | POA: Diagnosis not present

## 2022-12-01 DIAGNOSIS — Z79899 Other long term (current) drug therapy: Secondary | ICD-10-CM | POA: Diagnosis not present

## 2022-12-13 DIAGNOSIS — E1151 Type 2 diabetes mellitus with diabetic peripheral angiopathy without gangrene: Secondary | ICD-10-CM | POA: Diagnosis not present

## 2022-12-13 DIAGNOSIS — E11621 Type 2 diabetes mellitus with foot ulcer: Secondary | ICD-10-CM | POA: Diagnosis not present

## 2022-12-20 ENCOUNTER — Telehealth: Payer: Self-pay | Admitting: Internal Medicine

## 2022-12-20 ENCOUNTER — Ambulatory Visit: Payer: Medicare HMO | Admitting: General Practice

## 2022-12-20 NOTE — Telephone Encounter (Signed)
Patient was here today and left a form to be filled out.  They would like that form mailed back to their home address.

## 2022-12-20 NOTE — Telephone Encounter (Signed)
Pt was informed by front desk at that time that he would need an appointment with Dr. Ronnald Ramp for follow up and updated labs prior to form being completed. Did he schedule?

## 2022-12-24 NOTE — Progress Notes (Unsigned)
Cardiology Clinic Note   Patient Name: Jason [name] Date of Encounter: 12/24/2022  Primary Care Provider:  [name], MD Primary Cardiologist:  Jason Breeding, MD  Patient Profile    Jason [name] 82 year old male presents to the clinic today for follow-up evaluation of his mitral valve stenosis and atrial flutter.   Past Medical History    Past Medical History:  Diagnosis Date   Anxiety    Asthma    in past, no inhaler now   Benign neoplasm of colon    Bone infection ([place])    Cancer ([place])    bladder cancer   COPD (chronic obstructive pulmonary disease) ([place])    Depression    Diverticulosis of colon (without mention of hemorrhage)    DJD (degenerative joint disease)    Esophageal reflux    Glaucoma    Hidradenitis    History of BPH    History of gynecomastia    Unilateral   Hyperlipidemia    Irritable bowel syndrome    Memory loss    Mitral stenosis    mild-moderate MS 01/2016   MVP (mitral valve prolapse)    Obesity, unspecified    Pneumonia    Sleep apnea    does not use  cpap or bipap .. no study  ever   Type II or unspecified type diabetes mellitus without mention of complication, not stated as uncontrolled    Unspecified venous (peripheral) insufficiency    Past Surgical History:  Procedure Laterality Date   ACHILLES TENDON SURGERY Right 05/17/2019   Procedure: ACHILLES LENGTHENING/KIDNER;  Surgeon: Jason [name], DPM;  Location: [place];  Service: Podiatry;  Laterality: Right;   AMPUTATION  08/09/2012   Procedure: AMPUTATION DIGIT;  Surgeon: Jason Minion, MD;  Location: [place];  Service: Orthopedics;  Laterality: Right;  Amputation right Great Toe MTP Joint   AMPUTATION  10/17/2012   Procedure: AMPUTATION DIGIT;  Surgeon: Jason Minion, MD;  Location: [place];  Service: Orthopedics;  Laterality: Right;  Right Foot 3rd Toe Amputation at Metatarsophalangeal Joint   AMPUTATION TOE Right 05/17/2019   Procedure: AMPUTATION TOE Metatarsal Phalangeal Joint    FOURTH AND FIFTH, FILLETED TOE FLAP;  Surgeon: Jason [name], DPM;  Location: [place];  Service: Podiatry;  Laterality: Right;   BUBBLE STUDY  08/12/2021   Procedure: BUBBLE STUDY;  Surgeon: Jason Rile, MD;  Location: [place];  Service: Cardiovascular;;   CHOLECYSTECTOMY N/A 06/14/2018   Procedure: LAPAROSCOPIC CHOLECYSTECTOMY WITH INTRAOPERATIVE CHOLANGIOGRAM;  Surgeon: Jason Skinner, MD;  Location: [facility];  Service: General;  Laterality: N/A;   CIRCUMCISION  06/2100   For Phimosis - Dr Jason [name]   COLON SURGERY     CYSTOSCOPY W/ RETROGRADES Bilateral 07/07/2018   Procedure: CYSTOSCOPY WITH BILATERAL RETROGRADE PYELOGRAM;  Surgeon: Jason Hughs, MD;  Location[facility];  Service: Urology;  Laterality: Bilateral;   KNEE ARTHROSCOPY     LAPAROSCOPIC LYSIS OF ADHESIONS  06/14/2018   Procedure: LAPAROSCOPIC LYSIS OF ADHESIONS;  Surgeon: Jason Brightly Arta Bruce, MD;  Location[facility];  Service: General;;   LAPAROTOMY  07/20/11   LASER ABLATION  06/2009   Left Greater Saphenous vein -Dr Jason [name] OSTEOTOMY Right 05/17/2019   Procedure: METATARSLSECTOMY FOURTH DIGIT;  Surgeon: Jason [name], DPM;  Location: [place] [place];  Service: Podiatry;  Laterality: Right;   MULTIPLE TOOTH EXTRACTIONS     TEE WITHOUT CARDIOVERSION N/A 08/12/2021   Procedure: TRANSESOPHAGEAL ECHOCARDIOGRAM (TEE);  Surgeon: Jason Rile, MD;  Location: [place];  Service: Cardiovascular;  Laterality: N/A;   TOE AMPUTATION  06/2009   x3Right foot second toe -Dr Jason [name]   TONSILLECTOMY     TRANSURETHRAL RESECTION OF BLADDER TUMOR N/A 07/07/2018   Procedure: TRANSURETHRAL RESECTION OF BLADDER TUMOR (TURBT) WITH POST OP INSTILLATION OF GEMCITABIN;  Surgeon: Jason Hughs, MD;  [facility];  Service: Urology;  Laterality: N/A;   WOUND DEBRIDEMENT Right 07/25/2019   Procedure: Right foot wound debridement with application of skin graft substitute;  Surgeon: Jason [name], DPM;   Location[facility];  Service: Podiatry;  Laterality: Right;    Allergies  Allergies  Allergen Reactions   Bactrim [Sulfamethoxazole-Trimethoprim] Rash    History of Present Illness    Jason [name] has a PMH of mitral valve prolapse and moderate MR as well as mitral valve stenosis.  His PMH also includes atrial flutter, HLD, and type 2 diabetes.  He previously wore a cardiac event monitor which showed atrial flutter 100% of the time that was rate controlled.  He underwent TEE 08/12/2021 which showed a rheumatic appearing mitral valve with severely calcified anterior and posterior leaflets.  He was felt to have severe mitral stenosis and severe mitral regurgitation.   He was seen by Dr. Percival [name] on 08/03/2021.  During that time he was shown a video animation of his valvular issues.  He was limited in his overall activity due to fatigue.  He had also had some toes amputated on his right foot.  He was somewhat active with chores around his house.  He denied any symptoms of chest discomfort neck and arm discomfort.  He denied new shortness of breath orthopnea and PND.  He denied palpitations presyncope and syncope.   After his TEE 08/12/2021 attempt for follow-up was made.  He was unable to be reached via phone and missed scheduled follow-up appointments.  Eventually he was sent a letter.   He was seen in follow-up by Dr. Percival [name] on 10/18/2022.  During that time he was medically noncompliant.  Options for treatment were discussed.  He reported that he was not walking.  He had a fall and was working with physical therapy to help with his balance.  He did have the ability to walk.  He denied acute dyspnea with walking across his house.  He was not noted to have PND orthopnea.  He was noted to have sleep apnea but denied CPAP use.  He denied presyncope or syncope and palpitations.  He denied chest pain.  He was not noted to have weight gain or lower extremity edema.  His blood pressure was 120/54.  His  weight was noted to be 271 pounds.  His EKG showed sinus rhythm 72 bpm with first-degree AV block and no acute ST changes.  There was a long conversation had about his mitral stenosis.  He reported that he would want to have the surgery if he was a candidate.  Good blood pressure control was discussed.  It was felt that he needed to be compliant with his medications and follow-up visits.  A plan was made for consultation with oral surgeon to address removing teeth.  It was felt that if he adhere to recommendations he might be a candidate for mitral valve surgery.  He presents to the clinic today for follow-up evaluation and states***  *** denies chest pain, shortness of breath, lower extremity edema, fatigue, palpitations, melena, hematuria, hemoptysis, diaphoresis, weakness, presyncope, syncope, orthopnea, and  PND.  Mitral valve stenosis-previously noncompliant with therapy and follow-up appointments.  During previous appointment with Dr. Sharen Hint discussion about medication compliance, increasing physical activity, oral surgery were discussed.TEE 08/12/2021 showed severe mitral stenosis and calcification which was felt to be related to rheumatic disease.  Details above. Heart healthy low-sodium diet-salty 6 given Increase physical activity as tolerated  Atrial flutter-heart rate today***.  Denies recent episodes of accelerated or irregular heartbeat. Continue Xarelto Heart healthy low-sodium diet Increase physical activity as tolerated Avoid triggers caffeine, chocolate, EtOH, dehydration etc.  Hyperlipidemia-LDL*** Continue bempedoic acid, simvastatin Heart healthy low-sodium diet-salty 6 given Increase physical activity as tolerated  Type 2 diabetes-327 on 03/16/2022 Continue Ozempic, metformin, insulin Heart healthy low-sodium carb modified diet Increase physical activity as tolerated Follows with PCP  Disposition: Follow-up with Dr. Percival [name] in 3-4 months.  Home Medications     Prior to Admission medications   Medication Sig Start Date End Date Taking? Authorizing Provider  B-D UF III MINI PEN NEEDLES 31G X 5 MM MISC  07/10/21   [provider]  Bempedoic Acid-Ezetimibe (NEXLIZET) 180-10 MG TABS Take 1 tablet by mouth daily. 10/18/22   Jason Breeding, MD  Continuous Blood Gluc Receiver (FREESTYLE LIBRE 2 READER) DEVI 1 Act by Does not apply route daily. 03/10/22   Jason Lima, MD  Continuous Blood Gluc Sensor (FREESTYLE LIBRE 2 SENSOR) MISC 1 Act by Does not apply route daily. 03/10/22   Jason Lima, MD  Glucagon (GVOKE HYPOPEN 2-PACK) 1 MG/0.2ML SOAJ Inject 1 Act into the skin daily as needed. 11/10/21   Jason Lima, MD  insulin glargine, 2 Unit Dial, (TOUJEO MAX SOLOSTAR) 300 UNIT/ML Solostar Pen Inject 30 Units into the skin daily. 03/16/22   Jason Lima, MD  Insulin Pen Needle 32G X 6 MM MISC 1 Act by Does not apply route in the morning and at bedtime. 03/10/22   Jason Lima, MD  metFORMIN (GLUCOPHAGE) 500 MG tablet Take 2 tablets (1,000 mg total) by mouth 2 (two) times daily with a meal. 03/16/22   Jason Lima, MD  rivaroxaban (XARELTO) 20 MG TABS tablet Take 1 tablet (20 mg total) by mouth daily. 10/18/22   Jason Breeding, MD  simvastatin (ZOCOR) 40 MG tablet Take 1 tablet (40 mg total) by mouth daily at 6 PM. 10/18/22   Jason Breeding, MD  tirzepatide [facility]) 5 MG/0.5ML Pen Inject 5 mg into the skin once a week. 04/13/22   Jason Lima, MD  Wound Dressings (MEDIHONEY WOUND/BURN DRESSING) GEL Apply to affected are 3 times a week, and cover with sterile dressing. 01/04/20   Jason Lima, MD    Family History    Family History  Problem Relation Age of Onset   Diabetes Father    Cancer Father        Prostate   Stroke Father    Prostate cancer Father    High blood pressure Other    Colon cancer Neg Hx    Esophageal cancer Neg Hx    Rectal cancer Neg Hx    Stomach cancer Neg Hx    He indicated that his mother is deceased. He  indicated that his father is deceased. He indicated that his sister is alive. He indicated that his brother is deceased. He indicated that the status of his neg hx is unknown. He indicated that the status of his other is unknown.  Social History    Social History   Socioeconomic History  Marital status: Married    Spouse name: Peter Congo   Number of children: 2   Years of education: Not on file   Highest education level: Not on file  Occupational History   Occupation: retired  Tobacco Use   Smoking status: Former    Packs/day: 0.10    Years: 5.00    Total pack years: 0.50    Types: Cigarettes    Quit date: 05/10/2015    Years since quitting: 7.6   Smokeless tobacco: Never  Vaping Use   Vaping Use: Never used  Substance and Sexual Activity   Alcohol use: Not Currently    Alcohol/week: 7.0 standard drinks of alcohol    Types: 7 Cans of beer per week    Comment: social can of beer NONE RECENTLY   Drug use: No   Sexual activity: Not Currently  Other Topics Concern   Not on file  Social History Narrative   Not on file   Social Determinants of Health   Financial Resource Strain: Low Risk  (06/01/2022)   Overall Financial Resource Strain (CARDIA)    Difficulty of Paying Living Expenses: Not hard at all  Food Insecurity: No Food Insecurity (06/01/2022)   Hunger Vital Sign    Worried About Running Out of Food in the Last Year: Never true    Ran Out of Food in the Last Year: Never true  Transportation Needs: No Transportation Needs (06/01/2022)   PRAPARE - Hydrologist (Medical): No    Lack of Transportation (Non-Medical): No  Physical Activity: Sufficiently Active (06/01/2022)   Exercise Vital Sign    Days of Exercise per Week: 5 days    Minutes of Exercise per Session: 30 min  Stress: Stress Concern Present (06/01/2022)   Panacea    Feeling of Stress : Rather much  Social  Connections: Socially Integrated (06/01/2022)   Social Connection and Isolation Panel [NHANES]    Frequency of Communication with Friends and Family: More than three times a week    Frequency of Social Gatherings with Friends and Family: More than three times a week    Attends Religious Services: More than 4 times per year    Active Member of [employer] or Organizations: Yes    Attends Music therapist: More than 4 times per year    Marital Status: Married  Human resources officer Violence: Not At Risk (06/01/2022)   Humiliation, Afraid, Rape, and Kick questionnaire    Fear of Current or Ex-Partner: No    Emotionally Abused: No    Physically Abused: No    Sexually Abused: No     Review of Systems    General:  No chills, fever, night sweats or weight changes.  Cardiovascular:  No chest pain, dyspnea on exertion, edema, orthopnea, palpitations, paroxysmal nocturnal dyspnea. Dermatological: No rash, lesions/masses Respiratory: No cough, dyspnea Urologic: No hematuria, dysuria Abdominal:   No nausea, vomiting, diarrhea, bright red blood per rectum, melena, or hematemesis Neurologic:  No visual changes, wkns, changes in mental status. All other systems reviewed and are otherwise negative except as noted above.  Physical Exam    VS:  There were no vitals taken for this visit. , BMI There is no height or weight on file to calculate BMI. GEN: Well nourished, well developed, in no acute distress. HEENT: normal. Neck: Supple, no JVD, carotid bruits, or masses. Cardiac: RRR, no murmurs, rubs, or gallops. No clubbing, cyanosis, edema.  Radials/DP/PT 2+ and equal bilaterally.  Respiratory:  Respirations regular and unlabored, clear to auscultation bilaterally. GI: Soft, nontender, nondistended, BS + x 4. MS: no deformity or atrophy. Skin: warm and dry, no rash. Neuro:  Strength and sensation are intact. Psych: Normal affect.  Accessory Clinical Findings    Recent Labs: 03/16/2022: ALT 16;  BUN 10; Creatinine, Ser 1.18; Hemoglobin 13.4; Platelets 175.0; Potassium 4.0; Sodium 135   Recent Lipid Panel    Component Value Date/Time   CHOL 211 (H) 03/16/2022 0835   TRIG 93.0 03/16/2022 0835   HDL 54.20 03/16/2022 0835   CHOLHDL 4 03/16/2022 0835   VLDL 18.6 03/16/2022 0835   LDLCALC 138 (H) 03/16/2022 0835   LDLCALC 79 05/21/2020 1458    No BP recorded.  {Refresh Note OR Click here to enter BP  :1}***    ECG personally reviewed by me today- *** - No acute changes  Echocardiogram 08/12/2021   IMPRESSIONS     1. The mitral valve appears rheumatic with severely calcified anterior  and posterior leaflets. The leaflets demonstrate restricted movement in  systole/diastole. There is mixed severe mitral stenosis and severe mitral  regurgitation. The MG is 12 mmHG @  75 bpm. PHT 145 ms which equates to MVA 1.52 cm2. Direct 3D planimetry  shows MVA 1.40-1.50 cm2, consistent with severe mitral stenosis. There is  also severe, eccentric mitral regurgitation. This is due to severely  restricted movement of the posterior MV  leaflet in systole/diastole (IIIA). Regarding MR, 2D PISA 0.9 cm2, 2D ERO  0.37 cm2, R vol 54 cc. 3D VCA 0.35 cm2. There is systolic reversal of flow  in the left upper and left lower pulmonary veins. All consistent with  severe MR. Overall, there is  combined severe mitral stenosis and severe mitral regurgitation related to  rheumatic mitral valve disease with severe leaflet calcificaiton. The  mitral valve is rheumatic. Severe mitral valve regurgitation. Severe  mitral stenosis.   2. Left ventricular ejection fraction, by estimation, is 60 to 65%. The  left ventricle has normal function.   3. Right ventricular systolic function is normal. The right ventricular  size is normal.   4. Left atrial size was severely dilated. No left atrial/left atrial  appendage thrombus was detected. The LAA emptying velocity was 55 cm/s.   5. Right atrial size was mildly  dilated.   6. The aortic valve is tricuspid. There is mild calcification of the  aortic valve. Aortic valve regurgitation is not visualized.   7. There is mild (Grade II) layered plaque involving the descending  aorta.   8. Agitated saline contrast bubble study was positive with shunting  observed after >6 cardiac cycles suggestive of intrapulmonary shunting.  Assessment & Plan   1.  ***   Jossie Ng. Jerik Falletta NP-C     12/24/2022, 7:08 AM [place] [address] Office [phone] Fax [phone]    I spent***minutes examining this patient, reviewing medications, and using patient centered shared decision making involving her cardiac care.  Prior to her visit I spent greater than 20 minutes reviewing her past medical history,  medications, and prior cardiac tests.

## 2022-12-28 ENCOUNTER — Ambulatory Visit: Payer: Medicare HMO | Admitting: Cardiology

## 2022-12-28 ENCOUNTER — Ambulatory Visit: Payer: Medicare HMO | Attending: General Practice | Admitting: General Practice

## 2022-12-28 ENCOUNTER — Encounter: Payer: Self-pay | Admitting: General Practice

## 2022-12-28 VITALS — BP 134/72 | HR 75 | Ht >= 80 in | Wt 276.8 lb

## 2022-12-28 DIAGNOSIS — E785 Hyperlipidemia, unspecified: Secondary | ICD-10-CM | POA: Diagnosis not present

## 2022-12-28 DIAGNOSIS — I4892 Unspecified atrial flutter: Secondary | ICD-10-CM | POA: Diagnosis not present

## 2022-12-28 DIAGNOSIS — I342 Nonrheumatic mitral (valve) stenosis: Secondary | ICD-10-CM | POA: Diagnosis not present

## 2022-12-28 DIAGNOSIS — E1142 Type 2 diabetes mellitus with diabetic polyneuropathy: Secondary | ICD-10-CM | POA: Diagnosis not present

## 2022-12-28 NOTE — Patient Instructions (Signed)
Medication Instructions:  The current medical regimen is effective;  continue present plan and medications as directed. Please refer to the Current Medication list given to you today.  *If you need a refill on your cardiac medications before your next appointment, please call your pharmacy*  Lab Work: NONE If you have labs (blood work) drawn today and your tests are completely normal, you will receive your results only by: [place] (if you have MyChart) OR A paper copy in the mail If you have any lab test that is abnormal or we need to change your treatment, we will call you to review the results.  Testing/Procedures: NONE  Follow-Up: At [facility], you and your health needs are our priority.  As part of our continuing mission to provide you with exceptional heart care, we have created designated Provider Care Teams.  These Care Teams include your primary Cardiologist (physician) and Advanced Practice Providers (APPs -  Physician Assistants and Nurse Practitioners) who all work together to provide you with the care you need, when you need it.  We recommend signing up for the patient portal called "MyChart".  Sign up information is provided on this After Visit Summary.  MyChart is used to connect with patients for Virtual Visits (Telemedicine).  Patients are able to view lab/test results, encounter notes, upcoming appointments, etc.  Non-urgent messages can be sent to your provider as well.   To learn more about what you can do with MyChart, go to [email or web address].    Your next appointment:   4 month(s)  Provider:   Minus Breeding, MD     Other Instructions MAINTAIN YOUR PHYSICAL ACTIVITY  PLEASE SIGN UP FOR SILVER SNEAKERS OR THE [employer]  PLEASE READ AND FOLLOW ATTACHED FIBER DIET   High-Fiber Eating Plan Fiber, also called dietary fiber, is a type of carbohydrate. It is found foods such as fruits, vegetables, whole grains, and beans. A high-fiber diet can have  many health benefits. Your health care provider may recommend a high-fiber diet to help: Prevent constipation. Fiber can make your bowel movements more regular. Lower your cholesterol. Relieve the following conditions: Inflammation of veins in the anus (hemorrhoids). Inflammation of specific areas of the digestive tract (uncomplicated diverticulosis). A problem of the large intestine, also called the colon, that sometimes causes pain and diarrhea (irritable bowel syndrome, or IBS). Prevent overeating as part of a weight-loss plan. Prevent heart disease, type 2 diabetes, and certain cancers. What are tips for following this plan? Reading food labels  Check the nutrition facts label on food products for the amount of dietary fiber. Choose foods that have 5 grams of fiber or more per serving. The goals for recommended daily fiber intake include: Men (age 93 or younger): 34-38 g. Men (over age 3): 28-34 g. Women (age 13 or younger): 25-28 g. Women (over age 21): 22-25 g. Your daily fiber goal is _____________ g. Shopping Choose whole fruits and vegetables instead of processed forms, such as apple juice or applesauce. Choose a wide variety of high-fiber foods such as avocados, lentils, oats, and kidney beans. Read the nutrition facts label of the foods you choose. Be aware of foods with added fiber. These foods often have high sugar and sodium amounts per serving. Cooking Use whole-grain flour for baking and cooking. Cook with brown rice instead of white rice. Meal planning Start the day with a breakfast that is high in fiber, such as a cereal that contains 5 g of fiber or more per serving.  Eat breads and cereals that are made with whole-grain flour instead of refined flour or white flour. Eat brown rice, bulgur wheat, or millet instead of white rice. Use beans in place of meat in soups, salads, and pasta dishes. Be sure that half of the grains you eat each day are whole grains. General  information You can get the recommended daily intake of dietary fiber by: Eating a variety of fruits, vegetables, grains, nuts, and beans. Taking a fiber supplement if you are not able to take in enough fiber in your diet. It is better to get fiber through food than from a supplement. Gradually increase how much fiber you consume. If you increase your intake of dietary fiber too quickly, you may have bloating, cramping, or gas. Drink plenty of water to help you digest fiber. Choose high-fiber snacks, such as berries, raw vegetables, nuts, and popcorn. What foods should I eat? Fruits Berries. Pears. Apples. Oranges. Avocado. Prunes and raisins. Dried figs. Vegetables Sweet potatoes. Spinach. Kale. Artichokes. Cabbage. Broccoli. Cauliflower. Green peas. Carrots. Squash. Grains Whole-grain breads. Multigrain cereal. Oats and oatmeal. Brown rice. Barley. Bulgur wheat. [place]. Quinoa. Bran muffins. Popcorn. Rye wafer crackers. Meats and other proteins Navy beans, kidney beans, and pinto beans. Soybeans. Split peas. Lentils. Nuts and seeds. Dairy Fiber-fortified yogurt. Beverages Fiber-fortified soy milk. Fiber-fortified orange juice. Other foods Fiber bars. The items listed above may not be a complete list of recommended foods and beverages. Contact a dietitian for more information. What foods should I avoid? Fruits Fruit juice. Cooked, strained fruit. Vegetables Fried potatoes. Canned vegetables. Well-cooked vegetables. Grains White bread. Pasta made with refined flour. White rice. Meats and other proteins Fatty cuts of meat. Fried chicken or fried fish. Dairy Milk. Yogurt. Cream cheese. Sour cream. Fats and oils Butters. Beverages Soft drinks. Other foods Cakes and pastries. The items listed above may not be a complete list of foods and beverages to avoid. Talk with your dietitian about what choices are best for you. Summary Fiber is a type of carbohydrate. It is found in foods  such as fruits, vegetables, whole grains, and beans. A high-fiber diet has many benefits. It can help to prevent constipation, lower blood cholesterol, aid weight loss, and reduce your risk of heart disease, diabetes, and certain cancers. Increase your intake of fiber gradually. Increasing fiber too quickly may cause cramping, bloating, and gas. Drink plenty of water while you increase the amount of fiber you consume. The best sources of fiber include whole fruits and vegetables, whole grains, nuts, seeds, and beans. This information is not intended to replace advice given to you by your health care provider. Make sure you discuss any questions you have with your health care provider. Document Revised: 03/06/2020 Document Reviewed: 03/06/2020 Elsevier Patient Education  [place].

## 2023-01-12 DIAGNOSIS — E1151 Type 2 diabetes mellitus with diabetic peripheral angiopathy without gangrene: Secondary | ICD-10-CM | POA: Diagnosis not present

## 2023-01-12 DIAGNOSIS — E11621 Type 2 diabetes mellitus with foot ulcer: Secondary | ICD-10-CM | POA: Diagnosis not present

## 2023-02-17 DIAGNOSIS — E11621 Type 2 diabetes mellitus with foot ulcer: Secondary | ICD-10-CM | POA: Diagnosis not present

## 2023-02-17 DIAGNOSIS — E1151 Type 2 diabetes mellitus with diabetic peripheral angiopathy without gangrene: Secondary | ICD-10-CM | POA: Diagnosis not present

## 2023-03-08 ENCOUNTER — Telehealth: Payer: Self-pay | Admitting: Internal Medicine

## 2023-03-08 NOTE — Telephone Encounter (Signed)
A representative from [facility] supply needs updated office notes for patient's FreeStyle Libre 2. Best callback for them is [phone].

## 2023-03-08 NOTE — Telephone Encounter (Signed)
Spoke with rep from [facility] medical and they have 03/16/2022 Office notes already. Also LVM for pt to call back to make appt with [name] LOV was 03/16/22

## 2023-03-28 ENCOUNTER — Telehealth: Payer: Self-pay | Admitting: Internal Medicine

## 2023-03-28 NOTE — Telephone Encounter (Signed)
Patient has been scheduled for 03/29/2023.

## 2023-03-28 NOTE — Telephone Encounter (Signed)
[facility] called requesting most recent visit notes for patient's Free Style Libre. Patient has not been seen by PCP since 03/16/2022, so I will call and see if I can get him scheduled.

## 2023-03-29 ENCOUNTER — Encounter: Payer: Self-pay | Admitting: Internal Medicine

## 2023-03-29 ENCOUNTER — Ambulatory Visit (INDEPENDENT_AMBULATORY_CARE_PROVIDER_SITE_OTHER): Payer: Medicare HMO

## 2023-03-29 ENCOUNTER — Emergency Department (HOSPITAL_COMMUNITY)
Admit: 2023-03-29 | Discharge: 2023-03-29 | Disposition: A | Discharge status: Home / Self Care | Payer: Medicare HMO | Attending: Emergency Medicine | Admitting: Emergency Medicine

## 2023-03-29 ENCOUNTER — Encounter (HOSPITAL_COMMUNITY): Payer: Self-pay | Admitting: Family Medicine

## 2023-03-29 ENCOUNTER — Inpatient Hospital Stay (HOSPITAL_COMMUNITY): Payer: Medicare HMO

## 2023-03-29 ENCOUNTER — Other Ambulatory Visit: Payer: Self-pay

## 2023-03-29 ENCOUNTER — Ambulatory Visit (INDEPENDENT_AMBULATORY_CARE_PROVIDER_SITE_OTHER): Payer: Medicare HMO | Admitting: Internal Medicine

## 2023-03-29 ENCOUNTER — Emergency Department (HOSPITAL_COMMUNITY): Payer: Medicare HMO

## 2023-03-29 ENCOUNTER — Inpatient Hospital Stay (HOSPITAL_COMMUNITY)
Admission: EM | Admit: 2023-03-29 | Discharge: 2023-04-02 | DRG: 463 | Disposition: A | Discharge status: Home Health | Payer: Medicare HMO | Attending: Internal Medicine | Admitting: Internal Medicine

## 2023-03-29 ENCOUNTER — Encounter (HOSPITAL_COMMUNITY): Payer: Medicare HMO

## 2023-03-29 VITALS — BP 122/64 | HR 72 | Temp 98.2°F | Ht >= 80 in | Wt 260.0 lb

## 2023-03-29 DIAGNOSIS — Z823 Family history of stroke: Secondary | ICD-10-CM

## 2023-03-29 DIAGNOSIS — Z8709 Personal history of other diseases of the respiratory system: Secondary | ICD-10-CM

## 2023-03-29 DIAGNOSIS — Z794 Long term (current) use of insulin: Secondary | ICD-10-CM

## 2023-03-29 DIAGNOSIS — Y835 Amputation of limb(s) as the cause of abnormal reaction of the patient, or of later complication, without mention of misadventure at the time of the procedure: Secondary | ICD-10-CM | POA: Diagnosis present

## 2023-03-29 DIAGNOSIS — L089 Local infection of the skin and subcutaneous tissue, unspecified: Secondary | ICD-10-CM | POA: Diagnosis present

## 2023-03-29 DIAGNOSIS — T8743 Infection of amputation stump, right lower extremity: Principal | ICD-10-CM | POA: Diagnosis present

## 2023-03-29 DIAGNOSIS — Z7984 Long term (current) use of oral hypoglycemic drugs: Secondary | ICD-10-CM

## 2023-03-29 DIAGNOSIS — Z87891 Personal history of nicotine dependence: Secondary | ICD-10-CM

## 2023-03-29 DIAGNOSIS — A419 Sepsis, unspecified organism: Secondary | ICD-10-CM | POA: Diagnosis present

## 2023-03-29 DIAGNOSIS — G4733 Obstructive sleep apnea (adult) (pediatric): Secondary | ICD-10-CM | POA: Diagnosis present

## 2023-03-29 DIAGNOSIS — L97514 Non-pressure chronic ulcer of other part of right foot with necrosis of bone: Secondary | ICD-10-CM | POA: Diagnosis present

## 2023-03-29 DIAGNOSIS — E1169 Type 2 diabetes mellitus with other specified complication: Secondary | ICD-10-CM | POA: Diagnosis present

## 2023-03-29 DIAGNOSIS — I96 Gangrene, not elsewhere classified: Secondary | ICD-10-CM

## 2023-03-29 DIAGNOSIS — K5904 Chronic idiopathic constipation: Secondary | ICD-10-CM | POA: Diagnosis not present

## 2023-03-29 DIAGNOSIS — Z833 Family history of diabetes mellitus: Secondary | ICD-10-CM

## 2023-03-29 DIAGNOSIS — L03115 Cellulitis of right lower limb: Secondary | ICD-10-CM | POA: Diagnosis present

## 2023-03-29 DIAGNOSIS — Z7901 Long term (current) use of anticoagulants: Secondary | ICD-10-CM | POA: Diagnosis not present

## 2023-03-29 DIAGNOSIS — E876 Hypokalemia: Secondary | ICD-10-CM | POA: Diagnosis present

## 2023-03-29 DIAGNOSIS — E785 Hyperlipidemia, unspecified: Secondary | ICD-10-CM

## 2023-03-29 DIAGNOSIS — Z79899 Other long term (current) drug therapy: Secondary | ICD-10-CM

## 2023-03-29 DIAGNOSIS — T383X6A Underdosing of insulin and oral hypoglycemic [antidiabetic] drugs, initial encounter: Secondary | ICD-10-CM | POA: Diagnosis present

## 2023-03-29 DIAGNOSIS — I700 Atherosclerosis of aorta: Secondary | ICD-10-CM

## 2023-03-29 DIAGNOSIS — E1151 Type 2 diabetes mellitus with diabetic peripheral angiopathy without gangrene: Secondary | ICD-10-CM | POA: Diagnosis present

## 2023-03-29 DIAGNOSIS — M869 Osteomyelitis, unspecified: Secondary | ICD-10-CM | POA: Diagnosis not present

## 2023-03-29 DIAGNOSIS — E519 Thiamine deficiency, unspecified: Secondary | ICD-10-CM

## 2023-03-29 DIAGNOSIS — Z882 Allergy status to sulfonamides status: Secondary | ICD-10-CM

## 2023-03-29 DIAGNOSIS — E119 Type 2 diabetes mellitus without complications: Secondary | ICD-10-CM

## 2023-03-29 DIAGNOSIS — Z8551 Personal history of malignant neoplasm of bladder: Secondary | ICD-10-CM

## 2023-03-29 DIAGNOSIS — M6289 Other specified disorders of muscle: Secondary | ICD-10-CM | POA: Diagnosis not present

## 2023-03-29 DIAGNOSIS — I4891 Unspecified atrial fibrillation: Secondary | ICD-10-CM | POA: Diagnosis present

## 2023-03-29 DIAGNOSIS — Z8701 Personal history of pneumonia (recurrent): Secondary | ICD-10-CM

## 2023-03-29 DIAGNOSIS — Z0001 Encounter for general adult medical examination with abnormal findings: Secondary | ICD-10-CM

## 2023-03-29 DIAGNOSIS — Z7985 Long-term (current) use of injectable non-insulin antidiabetic drugs: Secondary | ICD-10-CM | POA: Diagnosis not present

## 2023-03-29 DIAGNOSIS — T45516A Underdosing of anticoagulants, initial encounter: Secondary | ICD-10-CM | POA: Diagnosis present

## 2023-03-29 DIAGNOSIS — J449 Chronic obstructive pulmonary disease, unspecified: Secondary | ICD-10-CM | POA: Diagnosis present

## 2023-03-29 DIAGNOSIS — Z91128 Patient's intentional underdosing of medication regimen for other reason: Secondary | ICD-10-CM

## 2023-03-29 DIAGNOSIS — E1165 Type 2 diabetes mellitus with hyperglycemia: Secondary | ICD-10-CM | POA: Diagnosis present

## 2023-03-29 DIAGNOSIS — I4892 Unspecified atrial flutter: Secondary | ICD-10-CM | POA: Diagnosis present

## 2023-03-29 DIAGNOSIS — Z881 Allergy status to other antibiotic agents status: Secondary | ICD-10-CM

## 2023-03-29 DIAGNOSIS — I10 Essential (primary) hypertension: Secondary | ICD-10-CM | POA: Diagnosis present

## 2023-03-29 DIAGNOSIS — L97519 Non-pressure chronic ulcer of other part of right foot with unspecified severity: Secondary | ICD-10-CM | POA: Diagnosis not present

## 2023-03-29 DIAGNOSIS — M86271 Subacute osteomyelitis, right ankle and foot: Secondary | ICD-10-CM | POA: Diagnosis present

## 2023-03-29 DIAGNOSIS — I739 Peripheral vascular disease, unspecified: Secondary | ICD-10-CM | POA: Diagnosis present

## 2023-03-29 DIAGNOSIS — Z91148 Patient's other noncompliance with medication regimen for other reason: Secondary | ICD-10-CM

## 2023-03-29 DIAGNOSIS — Z86010 Personal history of colonic polyps: Secondary | ICD-10-CM

## 2023-03-29 DIAGNOSIS — I70209 Unspecified atherosclerosis of native arteries of extremities, unspecified extremity: Secondary | ICD-10-CM | POA: Diagnosis not present

## 2023-03-29 DIAGNOSIS — I050 Rheumatic mitral stenosis: Secondary | ICD-10-CM | POA: Diagnosis present

## 2023-03-29 DIAGNOSIS — L97415 Non-pressure chronic ulcer of right heel and midfoot with muscle involvement without evidence of necrosis: Secondary | ICD-10-CM | POA: Diagnosis not present

## 2023-03-29 DIAGNOSIS — E11621 Type 2 diabetes mellitus with foot ulcer: Secondary | ICD-10-CM

## 2023-03-29 DIAGNOSIS — R739 Hyperglycemia, unspecified: Secondary | ICD-10-CM | POA: Diagnosis not present

## 2023-03-29 DIAGNOSIS — Z9049 Acquired absence of other specified parts of digestive tract: Secondary | ICD-10-CM

## 2023-03-29 DIAGNOSIS — Z8042 Family history of malignant neoplasm of prostate: Secondary | ICD-10-CM

## 2023-03-29 LAB — HEPATIC FUNCTION PANEL
ALT: 12 U/L (ref 0–53)
AST: 12 U/L (ref 0–37)
Albumin: 3.9 g/dL (ref 3.5–5.2)
Alkaline Phosphatase: 61 U/L (ref 39–117)
Bilirubin, Direct: 0.2 mg/dL (ref 0.0–0.3)
Total Bilirubin: 0.9 mg/dL (ref 0.2–1.2)
Total Protein: 7.7 g/dL (ref 6.0–8.3)

## 2023-03-29 LAB — CBC WITH DIFFERENTIAL/PLATELET
Basophils Absolute: 0 10*3/uL (ref 0.0–0.1)
Basophils Relative: 0.9 % (ref 0.0–3.0)
Eosinophils Absolute: 0.2 10*3/uL (ref 0.0–0.7)
Eosinophils Relative: 4.7 % (ref 0.0–5.0)
HCT: 42.9 % (ref 39.0–52.0)
Hemoglobin: 14.4 g/dL (ref 13.0–17.0)
Lymphocytes Relative: 16.6 % (ref 12.0–46.0)
Lymphs Abs: 0.9 10*3/uL (ref 0.7–4.0)
MCHC: 33.6 g/dL (ref 30.0–36.0)
MCV: 89.7 fl (ref 78.0–100.0)
Monocytes Absolute: 0.4 10*3/uL (ref 0.1–1.0)
Monocytes Relative: 8.1 % (ref 3.0–12.0)
Neutro Abs: 3.7 10*3/uL (ref 1.4–7.7)
Neutrophils Relative %: 69.7 % (ref 43.0–77.0)
Platelets: 220 10*3/uL (ref 150.0–400.0)
RBC: 4.79 Mil/uL (ref 4.22–5.81)
RDW: 12.9 % (ref 11.5–15.5)
WBC: 5.3 10*3/uL (ref 4.0–10.5)

## 2023-03-29 LAB — CBG MONITORING, ED: Glucose-Capillary: 484 mg/dL — ABNORMAL HIGH (ref 70–99)

## 2023-03-29 LAB — URINALYSIS, ROUTINE W REFLEX MICROSCOPIC
Bilirubin Urine: NEGATIVE
Hgb urine dipstick: NEGATIVE
Ketones, ur: NEGATIVE
Leukocytes,Ua: NEGATIVE
Nitrite: NEGATIVE
RBC / HPF: NONE SEEN (ref 0–?)
Specific Gravity, Urine: 1.015 (ref 1.000–1.030)
Total Protein, Urine: NEGATIVE
Urine Glucose: >=1000 — AB
Urobilinogen, UA: 0.2 (ref 0.0–1.0)
WBC, UA: NONE SEEN (ref 0–?)
pH: 6 (ref 5.0–8.0)

## 2023-03-29 LAB — COMPREHENSIVE METABOLIC PANEL
ALT: 15 U/L (ref 0–44)
AST: 13 U/L — ABNORMAL LOW (ref 15–41)
Albumin: 3.6 g/dL (ref 3.5–5.0)
Alkaline Phosphatase: 57 U/L (ref 38–126)
Anion gap: 8 (ref 5–15)
BUN: 15 mg/dL (ref 8–23)
CO2: 26 mmol/L (ref 22–32)
Calcium: 8.9 mg/dL (ref 8.9–10.3)
Chloride: 96 mmol/L — ABNORMAL LOW (ref 98–111)
Creatinine, Ser: 1.19 mg/dL (ref 0.61–1.24)
GFR, Estimated: >60 mL/min (ref >60)
Glucose, Bld: 475 mg/dL — ABNORMAL HIGH (ref 70–99)
Potassium: 4.3 mmol/L (ref 3.5–5.1)
Sodium: 130 mmol/L — ABNORMAL LOW (ref 135–145)
Total Bilirubin: 1.2 mg/dL (ref 0.3–1.2)
Total Protein: 7.9 g/dL (ref 6.5–8.1)

## 2023-03-29 LAB — BASIC METABOLIC PANEL
BUN: 14 mg/dL (ref 6–23)
CO2: 28 mEq/L (ref 19–32)
Calcium: 9.5 mg/dL (ref 8.4–10.5)
Chloride: 94 mEq/L — ABNORMAL LOW (ref 96–112)
Creatinine, Ser: 1.33 mg/dL (ref 0.40–1.50)
GFR: 50.3 mL/min — ABNORMAL LOW (ref >60.00)
Glucose, Bld: 490 mg/dL — ABNORMAL HIGH (ref 70–99)
Potassium: 4.4 mEq/L (ref 3.5–5.1)
Sodium: 131 mEq/L — ABNORMAL LOW (ref 135–145)

## 2023-03-29 LAB — CBC
HCT: 41.7 % (ref 39.0–52.0)
Hemoglobin: 13.8 g/dL (ref 13.0–17.0)
MCH: 29.8 pg (ref 26.0–34.0)
MCHC: 33.1 g/dL (ref 30.0–36.0)
MCV: 90.1 fL (ref 80.0–100.0)
Platelets: 207 10*3/uL (ref 150–400)
RBC: 4.63 MIL/uL (ref 4.22–5.81)
RDW: 11.7 % (ref 11.5–15.5)
WBC: 4.9 10*3/uL (ref 4.0–10.5)
nRBC: 0 % (ref 0.0–0.2)

## 2023-03-29 LAB — LIPID PANEL
Cholesterol: 188 mg/dL (ref 0–200)
HDL: 50.4 mg/dL (ref >39.00)
LDL Cholesterol: 114 mg/dL — ABNORMAL HIGH (ref 0–99)
NonHDL: 137.45
Total CHOL/HDL Ratio: 4
Triglycerides: 118 mg/dL (ref 0.0–149.0)
VLDL: 23.6 mg/dL (ref 0.0–40.0)

## 2023-03-29 LAB — VAS US ABI WITH/WO TBI
Left ABI: 1.63
Right ABI: 1.46

## 2023-03-29 LAB — MICROALBUMIN / CREATININE URINE RATIO
Creatinine,U: 63 mg/dL
Microalb Creat Ratio: 5.5 mg/g (ref 0.0–30.0)
Microalb, Ur: 3.5 mg/dL — ABNORMAL HIGH (ref 0.0–1.9)

## 2023-03-29 LAB — TSH: TSH: 1.47 u[IU]/mL (ref 0.35–5.50)

## 2023-03-29 LAB — SEDIMENTATION RATE: Sed Rate: 29 mm/hr — ABNORMAL HIGH (ref 0–16)

## 2023-03-29 LAB — HEMOGLOBIN A1C: Hgb A1c MFr Bld: 13.3 % — ABNORMAL HIGH (ref 4.6–6.5)

## 2023-03-29 LAB — GLUCOSE, CAPILLARY: Glucose-Capillary: 239 mg/dL — ABNORMAL HIGH (ref 70–99)

## 2023-03-29 MED ORDER — HYDROCODONE-ACETAMINOPHEN 5-325 MG PO TABS: 1.0000 | ORAL_TABLET | ORAL | Status: DC | PRN

## 2023-03-29 MED ORDER — BISACODYL 5 MG PO TBEC: 5.0000 mg | DELAYED_RELEASE_TABLET | Freq: Every day | ORAL | Status: DC | PRN

## 2023-03-29 MED ORDER — LORAZEPAM 2 MG/ML IJ SOLN: 1.0000 mg | Freq: Once | INTRAMUSCULAR | Status: DC | PRN

## 2023-03-29 MED ORDER — VANCOMYCIN HCL 1500 MG/300ML IV SOLN
1500.0000 mg | Freq: Once | INTRAVENOUS | Status: AC
  Administered 2023-03-29: 1500 mg via INTRAVENOUS
  Filled 2023-03-29 (×2): qty 300

## 2023-03-29 MED ORDER — MUPIROCIN 2 % EX OINT
1.0000 | TOPICAL_OINTMENT | Freq: Two times a day (BID) | CUTANEOUS | Status: DC
  Administered 2023-03-30 – 2023-04-02 (×7): 1 via NASAL
  Filled 2023-03-29: qty 22

## 2023-03-29 MED ORDER — INSULIN ASPART 100 UNIT/ML IJ SOLN
10.0000 [IU] | Freq: Once | INTRAMUSCULAR | Status: AC
  Administered 2023-03-29: 10 [IU] via SUBCUTANEOUS
  Filled 2023-03-29: qty 0.1

## 2023-03-29 MED ORDER — INSULIN ASPART 100 UNIT/ML IJ SOLN
0.0000 [IU] | INTRAMUSCULAR | Status: DC
  Administered 2023-03-29: 2 [IU] via SUBCUTANEOUS
  Administered 2023-03-29: 3 [IU] via SUBCUTANEOUS
  Administered 2023-03-30: 2 [IU] via SUBCUTANEOUS
  Administered 2023-03-30 (×2): 4 [IU] via SUBCUTANEOUS
  Administered 2023-03-31: 3 [IU] via SUBCUTANEOUS
  Administered 2023-03-31: 4 [IU] via SUBCUTANEOUS
  Filled 2023-03-29: qty 0.06

## 2023-03-29 MED ORDER — METRONIDAZOLE 500 MG PO TABS
500.0000 mg | ORAL_TABLET | Freq: Two times a day (BID) | ORAL | Status: DC
  Administered 2023-03-29 – 2023-03-31 (×5): 500 mg via ORAL
  Filled 2023-03-29 (×5): qty 1

## 2023-03-29 MED ORDER — SIMVASTATIN 40 MG PO TABS
40.0000 mg | ORAL_TABLET | Freq: Every day | ORAL | Status: DC
  Administered 2023-03-29 – 2023-04-01 (×4): 40 mg via ORAL
  Filled 2023-03-29 (×4): qty 1

## 2023-03-29 MED ORDER — ACETAMINOPHEN 325 MG PO TABS
650.0000 mg | ORAL_TABLET | Freq: Four times a day (QID) | ORAL | Status: DC | PRN
  Administered 2023-03-29 – 2023-03-30 (×2): 650 mg via ORAL
  Filled 2023-03-29 (×2): qty 2

## 2023-03-29 MED ORDER — INSULIN GLARGINE-YFGN 100 UNIT/ML ~~LOC~~ SOLN
15.0000 [IU] | Freq: Every day | SUBCUTANEOUS | Status: DC
  Administered 2023-03-29: 15 [IU] via SUBCUTANEOUS
  Filled 2023-03-29 (×2): qty 0.15

## 2023-03-29 MED ORDER — ACETAMINOPHEN 650 MG RE SUPP: 650.0000 mg | Freq: Four times a day (QID) | RECTAL | Status: DC | PRN

## 2023-03-29 MED ORDER — GADOBUTROL 1 MMOL/ML IV SOLN
10.0000 mL | Freq: Once | INTRAVENOUS | Status: AC | PRN
  Administered 2023-03-29: 10 mL via INTRAVENOUS

## 2023-03-29 MED ORDER — SODIUM CHLORIDE 0.9 % IV SOLN: INTRAVENOUS | Status: DC | PRN

## 2023-03-29 MED ORDER — PIPERACILLIN-TAZOBACTAM 3.375 G IVPB 30 MIN
3.3750 g | Freq: Once | INTRAVENOUS | Status: AC
  Administered 2023-03-29: 3.375 g via INTRAVENOUS
  Filled 2023-03-29: qty 50

## 2023-03-29 MED ORDER — SODIUM CHLORIDE 0.9 % IV BOLUS
1000.0000 mL | Freq: Once | INTRAVENOUS | Status: AC
  Administered 2023-03-29: 1000 mL via INTRAVENOUS

## 2023-03-29 MED ORDER — SODIUM CHLORIDE 0.9 % IV SOLN
2.0000 g | INTRAVENOUS | Status: DC
  Administered 2023-03-29 – 2023-04-01 (×3): 2 g via INTRAVENOUS
  Filled 2023-03-29 (×3): qty 20

## 2023-03-29 MED ORDER — POLYETHYLENE GLYCOL 3350 17 G PO PACK: 17.0000 g | PACK | Freq: Every day | ORAL | Status: DC | PRN

## 2023-03-29 MED ORDER — VANCOMYCIN HCL 1250 MG/250ML IV SOLN
1250.0000 mg | Freq: Two times a day (BID) | INTRAVENOUS | Status: DC
  Administered 2023-03-30 – 2023-03-31 (×4): 1250 mg via INTRAVENOUS
  Filled 2023-03-29 (×5): qty 250

## 2023-03-29 MED ORDER — VANCOMYCIN HCL IN DEXTROSE 1-5 GM/200ML-% IV SOLN
1000.0000 mg | Freq: Once | INTRAVENOUS | Status: AC
  Administered 2023-03-29: 1000 mg via INTRAVENOUS
  Filled 2023-03-29: qty 200

## 2023-03-29 NOTE — H&P (Signed)
History and Physical    Jason [name]:[id number] DOB: 05/21/1941 DOA: 03/29/2023  PCP: [name], MD   Patient coming from: Home   Chief Complaint: Right foot wound   HPI: Jason [name] is a 82 y.o. male with medical history significant for poorly controlled insulin-dependent diabetes mellitus, PAD, atrial flutter on Xarelto, OSA with CPAP intolerance, and mitral valve stenosis who presents to the emergency department for evaluation of nonhealing right foot ulcer.  Patient reports roughly 3 weeks of enlarging ulcer on his right foot but denies any pain associated with this and denies fever or chills.  He was seen at an outpatient clinic, there was concern for gangrene, and he was sent to the ED.  ED Course: Upon arrival to the ED, patient is found to be afebrile and saturating well on room air with normal heart rate and stable blood pressure.  Labs are notable for glucose of 475 with normal serum bicarbonate and normal anion gap.  Plain radiographs of the right foot are notable for soft tissue air and swelling but no acute osseous abnormality.  Podiatry (Dr. Annamary Rummage) was consulted by the ED physician, MRI of the foot was ordered, and the patient was treated with IV fluids, insulin, vancomycin, and Zosyn.  Review of Systems:  All other systems reviewed and apart from HPI, are negative.  Past Medical History:  Diagnosis Date   Anxiety    Asthma    in past, no inhaler now   Benign neoplasm of colon    Bone infection (HCC)    Cancer (HCC)    bladder cancer   COPD (chronic obstructive pulmonary disease) (HCC)    Depression    Diverticulosis of colon (without mention of hemorrhage)    DJD (degenerative joint disease)    Esophageal reflux    Glaucoma    Hidradenitis    History of BPH    History of gynecomastia    Unilateral   Hyperlipidemia    Irritable bowel syndrome    Memory loss    Mitral stenosis    mild-moderate MS 01/2016   MVP (mitral valve prolapse)     Obesity, unspecified    Pneumonia    Sleep apnea    does not use  cpap or bipap .. no study  ever   Type II or unspecified type diabetes mellitus without mention of complication, not stated as uncontrolled    Unspecified venous (peripheral) insufficiency     Past Surgical History:  Procedure Laterality Date   ACHILLES TENDON SURGERY Right 05/17/2019   Procedure: ACHILLES LENGTHENING/KIDNER;  Surgeon: Park Liter, DPM;  Location: [facility] OR;  Service: Podiatry;  Laterality: Right;   AMPUTATION  08/09/2012   Procedure: AMPUTATION DIGIT;  Surgeon: Nadara Mustard, MD;  Location: [facility] OR;  Service: Orthopedics;  Laterality: Right;  Amputation right Great Toe MTP Joint   AMPUTATION  10/17/2012   Procedure: AMPUTATION DIGIT;  Surgeon: Nadara Mustard, MD;  Location: [facility];  Service: Orthopedics;  Laterality: Right;  Right Foot 3rd Toe Amputation at Metatarsophalangeal Joint   AMPUTATION TOE Right 05/17/2019   Procedure: AMPUTATION TOE Metatarsal Phalangeal Joint   FOURTH AND FIFTH, FILLETED TOE FLAP;  Surgeon: Park Liter, DPM;  Location: [facility] OR;  Service: Podiatry;  Laterality: Right;   BUBBLE STUDY  08/12/2021   Procedure: BUBBLE STUDY;  Surgeon: Sande Rives, MD;  Location: [facility] ENDOSCOPY;  Service: Cardiovascular;;   CHOLECYSTECTOMY N/A 06/14/2018   Procedure: LAPAROSCOPIC CHOLECYSTECTOMY WITH INTRAOPERATIVE  CHOLANGIOGRAM;  Surgeon: Kinsinger, De Blanch, MD;  Location: [facility] ORS;  Service: General;  Laterality: N/A;   CIRCUMCISION  06/2100   For Phimosis - Dr Marcello Fennel   COLON SURGERY     CYSTOSCOPY W/ RETROGRADES Bilateral 07/07/2018   Procedure: CYSTOSCOPY WITH BILATERAL RETROGRADE PYELOGRAM;  Surgeon: Crist Fat, MD;  Location: [facility];  Service: Urology;  Laterality: Bilateral;   KNEE ARTHROSCOPY     LAPAROSCOPIC LYSIS OF ADHESIONS  06/14/2018   Procedure: LAPAROSCOPIC LYSIS OF ADHESIONS;  Surgeon: Sheliah Hatch De Blanch, MD;  Location: [facility];  Service: General;;   LAPAROTOMY  07/20/11    LASER ABLATION  06/2009   Left Greater Saphenous vein -Dr Jenean Lindau OSTEOTOMY Right 05/17/2019   Procedure: METATARSLSECTOMY FOURTH DIGIT;  Surgeon: Park Liter, DPM;  Location: [facility];  Service: Podiatry;  Laterality: Right;   MULTIPLE TOOTH EXTRACTIONS     TEE WITHOUT CARDIOVERSION N/A 08/12/2021   Procedure: TRANSESOPHAGEAL ECHOCARDIOGRAM (TEE);  Surgeon: Sande Rives, MD;  Location: [facility] ENDOSCOPY;  Service: Cardiovascular;  Laterality: N/A;   TOE AMPUTATION  06/2009   x3Right foot second toe -Dr Lestine Box   TONSILLECTOMY     TRANSURETHRAL RESECTION OF BLADDER TUMOR N/A 07/07/2018   Procedure: TRANSURETHRAL RESECTION OF BLADDER TUMOR (TURBT) WITH POST OP INSTILLATION OF GEMCITABIN;  Surgeon: Crist Fat, MD;  Location: [facility];  Service: Urology;  Laterality: N/A;   WOUND DEBRIDEMENT Right 07/25/2019   Procedure: Right foot wound debridement with application of skin graft substitute;  Surgeon: Park Liter, DPM;  Location: [facility];  Service: Podiatry;  Laterality: Right;    Social History:   reports that he quit smoking about 7 years ago. His smoking use included cigarettes. He has a 0.50 pack-year smoking history. He has never used smokeless tobacco. He reports that he does not currently use alcohol after a past usage of about 7.0 standard drinks of alcohol per week. He reports that he does not use drugs.  Allergies  Allergen Reactions   Bactrim [Sulfamethoxazole-Trimethoprim] Rash    Family History  Problem Relation Age of Onset   Diabetes Father    Cancer Father        Prostate   Stroke Father    Prostate cancer Father    High blood pressure Other    Colon cancer Neg Hx    Esophageal cancer Neg Hx    Rectal cancer Neg Hx    Stomach cancer Neg Hx      Prior to Admission medications   Medication Sig Start Date End Date Taking? Authorizing Provider  Bempedoic Acid-Ezetimibe (NEXLIZET) 180-10 MG TABS Take 1 tablet by mouth daily. 10/18/22  Yes  [name], MD  cyanocobalamin (VITAMIN B12) 500 MCG tablet Take 500 mcg by mouth daily.   Yes [provider]  insulin glargine, 2 Unit Dial, (TOUJEO MAX SOLOSTAR) 300 UNIT/ML Solostar Pen Inject 30 Units into the skin daily. Patient taking differently: Inject 30 Units into the skin at bedtime. 03/16/22  Yes [name], MD  metFORMIN (GLUCOPHAGE) 500 MG tablet Take 2 tablets (1,000 mg total) by mouth 2 (two) times daily with a meal. Patient taking differently: Take 500 mg by mouth 2 (two) times daily with a meal. 03/16/22  Yes [name], MD  simvastatin (ZOCOR) 40 MG tablet Take 1 tablet (40 mg total) by mouth daily at 6 PM. 10/18/22  Yes [name], MD  thiamine (VITAMIN B-1) 100 MG tablet Take 100 mg by  mouth daily.   Yes [provider]  Vitamin D, Ergocalciferol, (DRISDOL) 1.25 MG (50000 UNIT) CAPS capsule Take 50,000 Units by mouth every Thursday.   Yes [provider]  B-D UF III MINI PEN NEEDLES 31G X 5 MM MISC  07/10/21   [provider]  Continuous Blood Gluc Receiver (FREESTYLE LIBRE 2 READER) DEVI 1 Act by Does not apply route daily. Patient not taking: Reported on 03/29/2023 03/10/22   [name], MD  Continuous Blood Gluc Sensor (FREESTYLE LIBRE 2 SENSOR) MISC 1 Act by Does not apply route daily. Patient not taking: Reported on 03/29/2023 03/10/22   [name], MD  Glucagon (GVOKE HYPOPEN 2-PACK) 1 MG/0.2ML SOAJ Inject 1 Act into the skin daily as needed. Patient not taking: Reported on 03/29/2023 11/10/21   [name], MD  Insulin Pen Needle 32G X 6 MM MISC 1 Act by Does not apply route in the morning and at bedtime. 03/10/22   [name], MD  rivaroxaban (XARELTO) 20 MG TABS tablet Take 1 tablet (20 mg total) by mouth daily. Patient not taking: Reported on 03/29/2023 10/18/22   [name], MD  tirzepatide [facility]) 5 MG/0.5ML Pen Inject 5 mg into the skin once a week. Patient not taking: Reported on 03/29/2023  04/13/22   [name], MD  Wound Dressings (MEDIHONEY WOUND/BURN DRESSING) GEL Apply to affected are 3 times a week, and cover with sterile dressing. Patient not taking: Reported on 03/29/2023 01/04/20   [name], MD    Physical Exam: Vitals:   03/29/23 1530 03/29/23 1531 03/29/23 1654  BP:  118/65 (!) 144/79  Pulse:  67 61  Resp:  17 16  Temp:  97.6 F (36.4 C)   TempSrc:  Oral   SpO2: 96% 99% 99%     Constitutional: NAD, no pallor or diaphoresis   Eyes: PERTLA, lids and conjunctivae normal ENMT: Mucous membranes are moist. Posterior pharynx clear of any exudate or lesions.   Neck: supple, no masses  Respiratory: no wheezing, no crackles. No accessory muscle use.  Cardiovascular: S1 & S2 heard, regular rate and rhythm. No JVD. Abdomen: No distension, no tenderness, soft. Bowel sounds active.  Musculoskeletal: no clubbing / cyanosis. S/p toe amputations.   Skin: Deep ulceration at plantar aspect of right foot with foul odor and scant drainage. Warm, dry, well-perfused. Neurologic: CN 2-12 grossly intact. Moving all extremities. Alert and oriented.  Psychiatric: Calm. Cooperative.    Labs and Imaging on Admission: I have personally reviewed following labs and imaging studies  CBC: Recent Labs  Lab 03/29/23 1412 03/29/23 1600  WBC 5.3 4.9  NEUTROABS 3.7  --   HGB 14.4 13.8  HCT 42.9 41.7  MCV 89.7 90.1  PLT 220.0 207   Basic Metabolic Panel: Recent Labs  Lab 03/29/23 1412 03/29/23 1600  NA 131* 130*  K 4.4 4.3  CL 94* 96*  CO2 28 26  GLUCOSE 490* 475*  BUN 14 15  CREATININE 1.33 1.19  CALCIUM 9.5 8.9   GFR: Estimated Creatinine Clearance: 72.2 mL/min (by C-G formula based on SCr of 1.19 mg/dL). Liver Function Tests: Recent Labs  Lab 03/29/23 1412 03/29/23 1600  AST 12 13*  ALT 12 15  ALKPHOS 61 57  BILITOT 0.9 1.2  PROT 7.7 7.9  ALBUMIN 3.9 3.6   No results for input(s): "LIPASE", "AMYLASE" in the last 168 hours. No results for  input(s): "AMMONIA" in the last 168 hours. Coagulation Profile: No results  for input(s): "INR", "PROTIME" in the last 168 hours. Cardiac Enzymes: No results for input(s): "CKTOTAL", "CKMB", "CKMBINDEX", "TROPONINI" in the last 168 hours. BNP (last 3 results) No results for input(s): "PROBNP" in the last 8760 hours. HbA1C: Recent Labs    03/29/23 1412  HGBA1C 13.3*   CBG: Recent Labs  Lab 03/29/23 1529  GLUCAP 484*   Lipid Profile: Recent Labs    03/29/23 1412  CHOL 188  HDL 50.40  LDLCALC 114*  TRIG 118.0  CHOLHDL 4   Thyroid Function Tests: Recent Labs    03/29/23 1412  TSH 1.47   Anemia Panel: No results for input(s): "VITAMINB12", "FOLATE", "FERRITIN", "TIBC", "IRON", "RETICCTPCT" in the last 72 hours. Urine analysis:    Component Value Date/Time   COLORURINE YELLOW 03/29/2023 1412   APPEARANCEUR CLEAR 03/29/2023 1412   LABSPEC 1.015 03/29/2023 1412   PHURINE 6.0 03/29/2023 1412   GLUCOSEU >=1000 (A) 03/29/2023 1412   HGBUR NEGATIVE 03/29/2023 1412   BILIRUBINUR NEGATIVE 03/29/2023 1412   BILIRUBINUR neg 03/21/2018 0940   KETONESUR NEGATIVE 03/29/2023 1412   PROTEINUR NEGATIVE 05/21/2020 1458   UROBILINOGEN 0.2 03/29/2023 1412   NITRITE NEGATIVE 03/29/2023 1412   LEUKOCYTESUR NEGATIVE 03/29/2023 1412   Sepsis Labs: @LABRCNTIP (procalcitonin:4,lacticidven:4) )No results found for this or any previous visit (from the past 240 hour(s)).   Radiological Exams on Admission: DG Foot Complete Right  Result Date: 03/29/2023 CLINICAL DATA:  Right foot ulcer.  Evaluate for osteomyelitis. EXAM: RIGHT FOOT COMPLETE - 3+ VIEW COMPARISON:  Right foot radiographs 03/29/2023, earlier same day. Right foot radiographs 03/31/2021 FINDINGS: Redemonstration of amputation of the first through fifth toe phalanges and the distal aspect of the second through fourth metatarsals. Mild first through fifth tarsometatarsal joint space narrowing. Moderate to high-grade dorsal  talonavicular joint space narrowing and osteophytosis. Mild-to-moderate dorsal tarsometatarsal degenerative osteophytosis on lateral view. Moderate to large plantar and mild to moderate posterior calcaneal heel spurs. Diffuse soft tissue edema. Soft tissue air extending from the distal medial forefoot to the distal medial great toe metatarsal head. No cortical erosion is seen. IMPRESSION: 1. No significant change from radiographs performed earlier today. 2. Status post amputation of the first through fifth toe phalanges and distal aspect of the second through fourth metatarsals. 3. Soft tissue air at the distal medial aspect of the forefoot extending to the distal medial aspect of the great toe metatarsal head. No cortical erosion to indicate radiographic evidence of osteomyelitis. Electronically Signed   By: Neita Garnet M.D.   On: 03/29/2023 16:58   VAS [place] ABI WITH/WO TBI  Result Date: 03/29/2023  LOWER EXTREMITY DOPPLER STUDY Patient Name:  Jason [name]  Date of Exam:   03/29/2023 Medical Rec #: [id number]        Accession #:    [id number] Date of Birth: [birth year]         Patient Gender: M Patient Age:   28 years Exam Location:  [facility] Procedure:      VAS [place] ABI WITH/WO TBI Referring Phys: DAVID YAO --------------------------------------------------------------------------------  Indications: Ulceration. right transmetatarsal amputation High Risk Factors: Hyperlipidemia, Diabetes.  Comparison Study: 11/18/2022 - Right: Resting right ankle-brachial index is within                   normal range.                    Left:  Resting left ankle-brachial index is elevated, suggestive of                   medial                   arterial calcification. Absent left great toe PPG waveform. Performing Technologist: Olen Cordial RVT  Examination Guidelines: A complete evaluation includes at minimum, Doppler waveform signals and systolic blood pressure reading at the level of bilateral  brachial, anterior tibial, and posterior tibial arteries, when vessel segments are accessible. Bilateral testing is considered an integral part of a complete examination. Photoelectric Plethysmograph (PPG) waveforms and toe systolic pressure readings are included as required and additional duplex testing as needed. Limited examinations for reoccurring indications may be performed as noted.  ABI Findings: +---------+------------------+-----+-----------+----------+ Right    Rt Pressure (mmHg)IndexWaveform   Comment    +---------+------------------+-----+-----------+----------+ Brachial 108                    triphasic             +---------+------------------+-----+-----------+----------+ PTA      169               1.46 multiphasic           +---------+------------------+-----+-----------+----------+ DP       125               1.08 multiphasic           +---------+------------------+-----+-----------+----------+ Great Toe                                  Amputation +---------+------------------+-----+-----------+----------+ +---------+------------------+-----+-----------+-------+ Left     Lt Pressure (mmHg)IndexWaveform   Comment +---------+------------------+-----+-----------+-------+ Brachial 116                    triphasic          +---------+------------------+-----+-----------+-------+ PTA      175               1.51 multiphasic        +---------+------------------+-----+-----------+-------+ DP       189               1.63 multiphasic        +---------+------------------+-----+-----------+-------+ Great Toe51                0.44                    +---------+------------------+-----+-----------+-------+ +-------+-----------+-----------+------------+------------+ ABI/TBIToday's ABIToday's TBIPrevious ABIPrevious TBI +-------+-----------+-----------+------------+------------+ Right  1.46       Amputation 1.39        Amputation    +-------+-----------+-----------+------------+------------+ Left   1.63       0.44       1.53        1.12         +-------+-----------+-----------+------------+------------+   Summary: Right: Resting right ankle-brachial index indicates noncompressible right lower extremity arteries. Unable to obtain TBI due to great toe amputation. Left: Resting left ankle-brachial index indicates noncompressible left lower extremity arteries. The left toe-brachial index is abnormal. *See table(s) above for measurements and observations.     Preliminary    DG Foot Complete Right  Result Date: 03/29/2023 CLINICAL DATA:  Right foot pain for 3 weeks.  No injury. EXAM: RIGHT FOOT COMPLETE - 3+ VIEW COMPARISON:  03/31/2021. FINDINGS: Previous amputation of all toes, which was present on the prior  study. No fracture. No bone lesion. No bone resorption to suggest osteomyelitis. Joints are normally spaced and aligned. Mild dorsal spurring at the cuneiform metatarsal articulation on the lateral view and talar navicular articulation. Moderate-sized plantar and dorsal calcaneal spurs. Diffuse soft tissue edema. There is soft tissue air extending from the distal medial foot to the medial aspect of the first metatarsal head. IMPRESSION: 1. No fracture or dislocation. 2. No evidence of osteomyelitis. 3. Soft tissue air extending anteriorly from the first metatarsal head. Diffuse soft tissue swelling. Electronically Signed   By: Amie Portland M.D.   On: 03/29/2023 14:45    Assessment/Plan   1. Right foot ulcer  - Not septic on admission and no acute osseous findings noted on plain radiographs; MRI pending  - Continue broad-spectrum antibiotics, keep NPO after midnight, hold Xarelto pending podiatry consultation (he has not taken in several days), follow-up podiatry recommendations    2. Atrial flutter  - Hold Xarelto for possible surgery (he has not taken in several days)   3. IDDM  - A1c was 13.3% this month  - Check CBGs,  continue insulin    4. PAD  - No acute ischemia  - Continue statin    DVT prophylaxis: SCDs  Code Status: Full  Level of Care: Level of care: Med-Surg Family Communication: Wife updated at bedside  Disposition Plan:  Patient is from: home  Anticipated d/c is to: home  Anticipated d/c date is: 04/01/23  Patient currently: Pending podiatry consultation, MRI foot, likely surgery  Consults called: Podiatry  Admission status: Inpatient     Briscoe Deutscher, MD [facility]  03/29/2023, 5:36 PM

## 2023-03-29 NOTE — ED Provider Notes (Signed)
[facility] AT [facility] Provider Note   CSN: [id number] Arrival date & time: 03/29/23  1520     History  Chief Complaint  Patient presents with   Wound Infection    Jason [name] is a 82 y.o. male history of hypertension, A-fib on Xarelto, previous right partial foot amputation, here presenting with right foot ulcer with possible wound infection.  Patient has an known right foot ulcer and had previous right big toe amputation.  Patient has been managed with podiatry.  Patient also has seen vascular previously but no vascular procedure was performed.  Patient states that for the last month or so he noticed that the ulcer got larger. He has some purulent drainage as well.  He denies any fevers.  His sugar has been elevated.  He saw PCP today and was sent here for possible gangrenous foot.  He states that his right foot has been numb for the last several months.  The history is provided by the patient.       Home Medications Prior to Admission medications   Medication Sig Start Date End Date Taking? Authorizing Provider  B-D UF III MINI PEN NEEDLES 31G X 5 MM MISC  07/10/21   [provider]  Bempedoic Acid-Ezetimibe (NEXLIZET) 180-10 MG TABS Take 1 tablet by mouth daily. 10/18/22   [name], MD  Continuous Blood Gluc Receiver (FREESTYLE LIBRE 2 READER) DEVI 1 Act by Does not apply route daily. 03/10/22   [name], MD  Continuous Blood Gluc Sensor (FREESTYLE LIBRE 2 SENSOR) MISC 1 Act by Does not apply route daily. 03/10/22   [name], MD  Glucagon (GVOKE HYPOPEN 2-PACK) 1 MG/0.2ML SOAJ Inject 1 Act into the skin daily as needed. 11/10/21   [name], MD  insulin glargine, 2 Unit Dial, (TOUJEO MAX SOLOSTAR) 300 UNIT/ML Solostar Pen Inject 30 Units into the skin daily. 03/16/22   [name], MD  Insulin Pen Needle 32G X 6 MM MISC 1 Act by Does not apply route in the morning and at bedtime. 03/10/22   [name], MD   metFORMIN (GLUCOPHAGE) 500 MG tablet Take 2 tablets (1,000 mg total) by mouth 2 (two) times daily with a meal. 03/16/22   [name], MD  rivaroxaban (XARELTO) 20 MG TABS tablet Take 1 tablet (20 mg total) by mouth daily. 10/18/22   [name], MD  simvastatin (ZOCOR) 40 MG tablet Take 1 tablet (40 mg total) by mouth daily at 6 PM. 10/18/22   [name], MD  tirzepatide [facility]) 5 MG/0.5ML Pen Inject 5 mg into the skin once a week. 04/13/22   [name], MD  Wound Dressings (MEDIHONEY WOUND/BURN DRESSING) GEL Apply to affected are 3 times a week, and cover with sterile dressing. 01/04/20   [name], MD      Allergies    Bactrim [sulfamethoxazole-trimethoprim]    Review of Systems   Review of Systems  Skin:  Positive for wound.  All other systems reviewed and are negative.   Physical Exam Updated Vital Signs BP 118/65   Pulse 67   Temp 97.6 F (36.4 C) (Oral)   Resp 17   SpO2 99%  Physical Exam Vitals and nursing note reviewed.  Constitutional:      Comments: Chronically ill-appearing  HENT:     Head: Normocephalic.     Nose: Nose normal.     Mouth/Throat:     Mouth: Mucous membranes  are moist.  Eyes:     Extraocular Movements: Extraocular movements intact.     Pupils: Pupils are equal, round, and reactive to light.  Cardiovascular:     Rate and Rhythm: Normal rate and regular rhythm.     Pulses: Normal pulses.     Heart sounds: Normal heart sounds.  Pulmonary:     Effort: Pulmonary effort is normal.     Breath sounds: Normal breath sounds.  Abdominal:     General: Abdomen is flat.     Palpations: Abdomen is soft.  Musculoskeletal:     Cervical back: Normal range of motion and neck supple.     Comments: Patient has a large ulcer on the base of the first metatarsal bone.  Patient has a previous right foot amputation.  Patient has very diminished right DP pulse and the right foot is cold overall  Skin:    Capillary Refill: Capillary refill takes  less than 2 seconds.  Neurological:     General: No focal deficit present.     Mental Status: He is oriented to person, place, and time.  Psychiatric:        Mood and Affect: Mood normal.        Behavior: Behavior normal.     ED Results / Procedures / Treatments   Labs (all labs ordered are listed, but only abnormal results are displayed) Labs Reviewed  CBG MONITORING, ED - Abnormal; Notable for the following components:      Result Value   Glucose-Capillary 484 (*)    All other components within normal limits  CBC  COMPREHENSIVE METABOLIC PANEL  CBG MONITORING, ED    EKG None  Radiology DG Foot Complete Right  Result Date: 03/29/2023 CLINICAL DATA:  Right foot pain for 3 weeks.  No injury. EXAM: RIGHT FOOT COMPLETE - 3+ VIEW COMPARISON:  03/31/2021. FINDINGS: Previous amputation of all toes, which was present on the prior study. No fracture. No bone lesion. No bone resorption to suggest osteomyelitis. Joints are normally spaced and aligned. Mild dorsal spurring at the cuneiform metatarsal articulation on the lateral view and talar navicular articulation. Moderate-sized plantar and dorsal calcaneal spurs. Diffuse soft tissue edema. There is soft tissue air extending from the distal medial foot to the medial aspect of the first metatarsal head. IMPRESSION: 1. No fracture or dislocation. 2. No evidence of osteomyelitis. 3. Soft tissue air extending anteriorly from the first metatarsal head. Diffuse soft tissue swelling. Electronically Signed   By: Amie Portland M.D.   On: 03/29/2023 14:45    Procedures Procedures    Medications Ordered in ED Medications  vancomycin (VANCOCIN) IVPB 1000 mg/200 mL premix (1,000 mg Intravenous New Bag/Given 03/29/23 1552)  piperacillin-tazobactam (ZOSYN) IVPB 3.375 g (3.375 g Intravenous New Bag/Given 03/29/23 1554)  insulin aspart (novoLOG) injection 10 Units (10 Units Subcutaneous Given 03/29/23 1554)  sodium chloride 0.9 % bolus 1,000 mL (1,000 mLs  Intravenous New Bag/Given 03/29/23 1553)    ED Course/ Medical Decision Making/ A&P                             Medical Decision Making Jason [name] is a 82 y.o. male here presenting with right foot ulcer that is not healing.  Concern for possible osteomyelitis.  Also concern for poor circulation or blood flow to the area.  Will get ABI and labs.  Will give broad-spectrum antibiotics and x-rays to rule out osteo-.  Will consult  podiatry.  4:59 PM Patient's white blood cell count is normal.  X-ray from earlier today showed some soft tissue air around the wound.  Patient's ABI is unchanged from previous.  I discussed case with Dr. Annamary Rummage from podiatry.  He recommended MRI of the foot and he will see patient later today.  He states that patient likely will need surgery tomorrow for partial amputation and recommend n.p.o. after midnight.   Problems Addressed: Hyperglycemia: acute illness or injury Ulcer of right foot, unspecified ulcer stage (HCC): acute illness or injury  Amount and/or Complexity of Data Reviewed Labs: ordered. Decision-making details documented in ED Course. Radiology: ordered and independent interpretation performed. Decision-making details documented in ED Course.  Risk Prescription drug management. Decision regarding hospitalization.    Final Clinical Impression(s) / ED Diagnoses Final diagnoses:  None    Rx / DC Orders ED Discharge Orders     None         Charlynne Pander, MD 03/29/23 [phone]

## 2023-03-29 NOTE — ED Notes (Signed)
Patient transported to MRI 

## 2023-03-29 NOTE — ED Notes (Signed)
Unable to take patient up yet. Pt will have his MRI first before taking patient up in the floor.

## 2023-03-29 NOTE — Progress Notes (Signed)
ABI's have been completed. Preliminary results can be found in CV Proc through chart review.  Results were given to Dr. Silverio Lay.  03/29/23 4:21 PM Olen Cordial RVT

## 2023-03-29 NOTE — Progress Notes (Signed)
Pharmacy Antibiotic Note  Jason [name] is a 82 y.o. male admitted on 03/29/2023 with  DM foot infection .  Pharmacy has been consulted for vanc dosing. Rocephin/flagyl per Md  Plan: Vanc 1g x 1 already given. Will order another 1500mg  to give 2500mg  total for loading dose. Then start 1250mg  IV q12, goal AUC 400-550     Temp (24hrs), Avg:97.9 F (36.6 C), Min:97.6 F (36.4 C), Max:98.2 F (36.8 C)  Recent Labs  Lab 03/29/23 1412 03/29/23 1600  WBC 5.3 4.9  CREATININE 1.33 1.19    Estimated Creatinine Clearance: 72.2 mL/min (by C-G formula based on SCr of 1.19 mg/dL).    Allergies  Allergen Reactions   Bactrim [Sulfamethoxazole-Trimethoprim] Rash    Thank you for allowing pharmacy to be a part of this patient's care.  Berkley Harvey 03/29/2023 5:38 PM

## 2023-03-29 NOTE — ED Triage Notes (Signed)
Pt arrived via POV. Was referred here by PCP. Pt has deep wound on R forefoot. Pt has had amputations of all toes on that foot.   AOx4  CBG: 484 in triage

## 2023-03-29 NOTE — ED Notes (Signed)
ED TO INPATIENT HANDOFF REPORT  Name/Age/Gender Jason [name] 82 y.o. male  Code Status    Code Status Orders  (From admission, onward)           Start     Ordered   03/29/23 1729  Full code  Continuous       Question:  By:  Answer:  Consent: discussion documented in EHR   03/29/23 1729           Code Status History     Date Active Date Inactive Code Status Order ID Comments User Context   06/13/2018 2202 06/15/2018 1712 Full Code [id number]  Colon Branch, MD Inpatient   11/16/2015 1310 11/19/2015 1429 Full Code [id number]  Osvaldo Shipper, MD Inpatient   11/05/2015 1643 11/07/2015 1356 Full Code [id number]  Rhetta Mura, MD Inpatient       Home/SNF/Other Home  Chief Complaint Right foot infection [L08.9]  Level of Care/Admitting Diagnosis ED Disposition     ED Disposition  Admit   Condition  --   Comment  Hospital Area: [facility] [[id number]]  Level of Care: Med-Surg [16]  May admit patient to [facility] or [facility] if equivalent level of care is available:: Yes  Covid Evaluation: Asymptomatic - no recent exposure (last 10 days) testing not required  Diagnosis: Right foot infection [629528]  Admitting Physician: Briscoe Deutscher [[id number]]  Attending Physician: Briscoe Deutscher [[id number]]  Certification:: I certify this patient will need inpatient services for at least 2 midnights  Estimated Length of Stay: 3          Medical History Past Medical History:  Diagnosis Date   Anxiety    Asthma    in past, no inhaler now   Benign neoplasm of colon    Bone infection (HCC)    Cancer (HCC)    bladder cancer   COPD (chronic obstructive pulmonary disease) (HCC)    Depression    Diverticulosis of colon (without mention of hemorrhage)    DJD (degenerative joint disease)    Esophageal reflux    Glaucoma    Hidradenitis    History of BPH    History of gynecomastia    Unilateral   Hyperlipidemia    Irritable bowel  syndrome    Memory loss    Mitral stenosis    mild-moderate MS 01/2016   MVP (mitral valve prolapse)    Obesity, unspecified    Pneumonia    Sleep apnea    does not use  cpap or bipap .. no study  ever   Type II or unspecified type diabetes mellitus without mention of complication, not stated as uncontrolled    Unspecified venous (peripheral) insufficiency     Allergies Allergies  Allergen Reactions   Bactrim [Sulfamethoxazole-Trimethoprim] Rash    IV Location/Drains/Wounds Patient Lines/Drains/Airways Status     Active Line/Drains/Airways     Name Placement date Placement time Site Days   Peripheral IV 03/29/23 20 G Right Antecubital 03/29/23  1552  Antecubital  less than 1            Labs/Imaging Results for orders placed or performed during the hospital encounter of 03/29/23 (from the past 48 hour(s))  CBG monitoring, ED     Status: Abnormal   Collection Time: 03/29/23  3:29 PM  Result Value Ref Range   Glucose-Capillary 484 (H) 70 - 99 mg/dL    Comment: Glucose reference range applies only to samples taken after fasting for at least  8 hours.  CBC     Status: None   Collection Time: 03/29/23  4:00 PM  Result Value Ref Range   WBC 4.9 4.0 - 10.5 K/uL   RBC 4.63 4.22 - 5.81 MIL/uL   Hemoglobin 13.8 13.0 - 17.0 g/dL   HCT 16.1 09.6 - 04.5 %   MCV 90.1 80.0 - 100.0 fL   MCH 29.8 26.0 - 34.0 pg   MCHC 33.1 30.0 - 36.0 g/dL   RDW 40.9 81.1 - 91.4 %   Platelets 207 150 - 400 K/uL   nRBC 0.0 0.0 - 0.2 %    Comment: Performed at [facility], [address], [place], [place] [address]  Comprehensive metabolic panel     Status: Abnormal   Collection Time: 03/29/23  4:00 PM  Result Value Ref Range   Sodium 130 (L) 135 - 145 mmol/L   Potassium 4.3 3.5 - 5.1 mmol/L   Chloride 96 (L) 98 - 111 mmol/L   CO2 26 22 - 32 mmol/L   Glucose, Bld 475 (H) 70 - 99 mg/dL    Comment: Glucose reference range applies only to samples taken after fasting for at  least 8 hours.   BUN 15 8 - 23 mg/dL   Creatinine, Ser 6.21 0.61 - 1.24 mg/dL   Calcium 8.9 8.9 - 30.8 mg/dL   Total Protein 7.9 6.5 - 8.1 g/dL   Albumin 3.6 3.5 - 5.0 g/dL   AST 13 (L) 15 - 41 U/L   ALT 15 0 - 44 U/L   Alkaline Phosphatase 57 38 - 126 U/L   Total Bilirubin 1.2 0.3 - 1.2 mg/dL   GFR, Estimated >65 >78 mL/min    Comment: (NOTE) Calculated using the CKD-EPI Creatinine Equation (2021)    Anion gap 8 5 - 15    Comment: Performed at [facility], [address], [place], [place] [address]   DG Foot Complete Right  Result Date: 03/29/2023 CLINICAL DATA:  Right foot ulcer.  Evaluate for osteomyelitis. EXAM: RIGHT FOOT COMPLETE - 3+ VIEW COMPARISON:  Right foot radiographs 03/29/2023, earlier same day. Right foot radiographs 03/31/2021 FINDINGS: Redemonstration of amputation of the first through fifth toe phalanges and the distal aspect of the second through fourth metatarsals. Mild first through fifth tarsometatarsal joint space narrowing. Moderate to high-grade dorsal talonavicular joint space narrowing and osteophytosis. Mild-to-moderate dorsal tarsometatarsal degenerative osteophytosis on lateral view. Moderate to large plantar and mild to moderate posterior calcaneal heel spurs. Diffuse soft tissue edema. Soft tissue air extending from the distal medial forefoot to the distal medial great toe metatarsal head. No cortical erosion is seen. IMPRESSION: 1. No significant change from radiographs performed earlier today. 2. Status post amputation of the first through fifth toe phalanges and distal aspect of the second through fourth metatarsals. 3. Soft tissue air at the distal medial aspect of the forefoot extending to the distal medial aspect of the great toe metatarsal head. No cortical erosion to indicate radiographic evidence of osteomyelitis. Electronically Signed   By: Neita Garnet M.D.   On: 03/29/2023 16:58   VAS [place] ABI WITH/WO TBI  Result Date: 03/29/2023   LOWER EXTREMITY DOPPLER STUDY Patient Name:  Jason [name]  Date of Exam:   03/29/2023 Medical Rec #: [id number]        Accession #:    [id number] Date of Birth: January 18, 1941         Patient Gender: M Patient Age:   74 years Exam  Location:  [facility] Procedure:      VAS [place] ABI WITH/WO TBI Referring Phys: DAVID YAO --------------------------------------------------------------------------------  Indications: Ulceration. right transmetatarsal amputation High Risk Factors: Hyperlipidemia, Diabetes.  Comparison Study: 11/18/2022 - Right: Resting right ankle-brachial index is within                   normal range.                    Left:                    Resting left ankle-brachial index is elevated, suggestive of                   medial                   arterial calcification. Absent left great toe PPG waveform. Performing Technologist: Olen Cordial RVT  Examination Guidelines: A complete evaluation includes at minimum, Doppler waveform signals and systolic blood pressure reading at the level of bilateral brachial, anterior tibial, and posterior tibial arteries, when vessel segments are accessible. Bilateral testing is considered an integral part of a complete examination. Photoelectric Plethysmograph (PPG) waveforms and toe systolic pressure readings are included as required and additional duplex testing as needed. Limited examinations for reoccurring indications may be performed as noted.  ABI Findings: +---------+------------------+-----+-----------+----------+ Right    Rt Pressure (mmHg)IndexWaveform   Comment    +---------+------------------+-----+-----------+----------+ Brachial 108                    triphasic             +---------+------------------+-----+-----------+----------+ PTA      169               1.46 multiphasic           +---------+------------------+-----+-----------+----------+ DP       125               1.08 multiphasic            +---------+------------------+-----+-----------+----------+ Great Toe                                  Amputation +---------+------------------+-----+-----------+----------+ +---------+------------------+-----+-----------+-------+ Left     Lt Pressure (mmHg)IndexWaveform   Comment +---------+------------------+-----+-----------+-------+ Brachial 116                    triphasic          +---------+------------------+-----+-----------+-------+ PTA      175               1.51 multiphasic        +---------+------------------+-----+-----------+-------+ DP       189               1.63 multiphasic        +---------+------------------+-----+-----------+-------+ Great Toe51                0.44                    +---------+------------------+-----+-----------+-------+ +-------+-----------+-----------+------------+------------+ ABI/TBIToday's ABIToday's TBIPrevious ABIPrevious TBI +-------+-----------+-----------+------------+------------+ Right  1.46       Amputation 1.39        Amputation   +-------+-----------+-----------+------------+------------+ Left   1.63       0.44       1.53        1.12         +-------+-----------+-----------+------------+------------+  Summary: Right: Resting right ankle-brachial index indicates noncompressible right lower extremity arteries. Unable to obtain TBI due to great toe amputation. Left: Resting left ankle-brachial index indicates noncompressible left lower extremity arteries. The left toe-brachial index is abnormal. *See table(s) above for measurements and observations.     Preliminary    DG Foot Complete Right  Result Date: 03/29/2023 CLINICAL DATA:  Right foot pain for 3 weeks.  No injury. EXAM: RIGHT FOOT COMPLETE - 3+ VIEW COMPARISON:  03/31/2021. FINDINGS: Previous amputation of all toes, which was present on the prior study. No fracture. No bone lesion. No bone resorption to suggest osteomyelitis. Joints are normally  spaced and aligned. Mild dorsal spurring at the cuneiform metatarsal articulation on the lateral view and talar navicular articulation. Moderate-sized plantar and dorsal calcaneal spurs. Diffuse soft tissue edema. There is soft tissue air extending from the distal medial foot to the medial aspect of the first metatarsal head. IMPRESSION: 1. No fracture or dislocation. 2. No evidence of osteomyelitis. 3. Soft tissue air extending anteriorly from the first metatarsal head. Diffuse soft tissue swelling. Electronically Signed   By: Amie Portland M.D.   On: 03/29/2023 14:45    Pending Labs Unresulted Labs (From admission, onward)     Start     Ordered   03/30/23 0500  Basic metabolic panel  Daily,   R      03/29/23 1729   03/30/23 0500  CBC  Daily,   R      03/29/23 1729   03/29/23 1728  Sedimentation rate  Once,   R        03/29/23 1729   03/29/23 1728  C-reactive protein  Once,   R        03/29/23 1729   03/29/23 1728  Prealbumin  Once,   R        03/29/23 1729            Vitals/Pain Today's Vitals   03/29/23 1530 03/29/23 1531 03/29/23 1654  BP:  118/65 (!) 144/79  Pulse:  67 61  Resp:  17 16  Temp:  97.6 F (36.4 C)   TempSrc:  Oral   SpO2: 96% 99% 99%  PainSc: 0-No pain      Isolation Precautions No active isolations  Medications Medications  LORazepam (ATIVAN) injection 1 mg (has no administration in time range)  simvastatin (ZOCOR) tablet 40 mg (has no administration in time range)  insulin glargine-yfgn (SEMGLEE) injection 15 Units (has no administration in time range)  insulin aspart (novoLOG) injection 0-6 Units (has no administration in time range)  cefTRIAXone (ROCEPHIN) 2 g in sodium chloride 0.9 % 100 mL IVPB (has no administration in time range)  metroNIDAZOLE (FLAGYL) tablet 500 mg (has no administration in time range)  acetaminophen (TYLENOL) tablet 650 mg (has no administration in time range)    Or  acetaminophen (TYLENOL) suppository 650 mg (has no  administration in time range)  polyethylene glycol (MIRALAX / GLYCOLAX) packet 17 g (has no administration in time range)  HYDROcodone-acetaminophen (NORCO/VICODIN) 5-325 MG per tablet 1 tablet (has no administration in time range)  bisacodyl (DULCOLAX) EC tablet 5 mg (has no administration in time range)  insulin aspart (novoLOG) injection 10 Units (10 Units Subcutaneous Given 03/29/23 1554)  sodium chloride 0.9 % bolus 1,000 mL (0 mLs Intravenous Stopped 03/29/23 1645)  vancomycin (VANCOCIN) IVPB 1000 mg/200 mL premix (0 mg Intravenous Stopped 03/29/23 1722)  piperacillin-tazobactam (ZOSYN) IVPB 3.375 g (0 g Intravenous Stopped 03/29/23 1645)  Mobility walks with person assist

## 2023-03-29 NOTE — Consult Note (Signed)
PODIATRY CONSULTATION  NAME Jason [name] MRN [id number] DOB 10/06/1941 DOA 03/29/2023   Reason for consult:  Chief Complaint  Patient presents with   Wound Infection    Attending/Consulting physician: Opyd MD  History of present illness: Jason [name] is a 82 y.o. male with medical history significant for poorly controlled insulin-dependent diabetes mellitus, PAD, atrial flutter on Xarelto, OSA with CPAP intolerance, and mitral valve stenosis who presents to the emergency department for evaluation of nonhealing right foot ulcer.   Patient reports roughly 3 weeks of enlarging ulcer on his right foot but denies any pain associated with this and denies fever or chills.  He was seen at an outpatient clinic, there was concern for gangrene, and he was sent to the ED.   Previously underwent multiple toe amputations on right foot. Has had wound issues ever since.   Past Medical History:  Diagnosis Date   Anxiety    Asthma    in past, no inhaler now   Benign neoplasm of colon    Bone infection (HCC)    Cancer (HCC)    bladder cancer   COPD (chronic obstructive pulmonary disease) (HCC)    Depression    Diverticulosis of colon (without mention of hemorrhage)    DJD (degenerative joint disease)    Esophageal reflux    Glaucoma    Hidradenitis    History of BPH    History of gynecomastia    Unilateral   Hyperlipidemia    Irritable bowel syndrome    Memory loss    Mitral stenosis    mild-moderate MS 01/2016   MVP (mitral valve prolapse)    Obesity, unspecified    Pneumonia    Sleep apnea    does not use  cpap or bipap .. no study  ever   Type II or unspecified type diabetes mellitus without mention of complication, not stated as uncontrolled    Unspecified venous (peripheral) insufficiency        Latest Ref Rng & Units 03/29/2023    4:00 PM 03/29/2023    2:12 PM 03/16/2022    8:35 AM  CBC  WBC 4.0 - 10.5 K/uL 4.9  5.3  5.0   Hemoglobin 13.0 - 17.0 g/dL 78.2  95.6  21.3    Hematocrit 39.0 - 52.0 % 41.7  42.9  39.9   Platelets 150 - 400 K/uL 207  220.0  175.0        Latest Ref Rng & Units 03/29/2023    4:00 PM 03/29/2023    2:12 PM 03/16/2022    8:35 AM  BMP  Glucose 70 - 99 mg/dL 086  578  469   BUN 8 - 23 mg/dL 15  14  10    Creatinine 0.61 - 1.24 mg/dL 6.29  5.28  4.13   Sodium 135 - 145 mmol/L 130  131  135   Potassium 3.5 - 5.1 mmol/L 4.3  4.4  4.0   Chloride 98 - 111 mmol/L 96  94  99   CO2 22 - 32 mmol/L 26  28  29    Calcium 8.9 - 10.3 mg/dL 8.9  9.5  9.1       Physical Exam: Lower Extremity Exam Vasc: R - PT palpable, DP palpable. Cap refill < 3 sec to digits  L - PT palpable, DP palpable. Cap refill <3 sec to digits  Derm: R foot Wound: Located sub 1st met heat, measuring approximately 4 cm, probe to bone  no, odor  yes, drainage yes   L - Normal temp/texture/turgor with no open lesion or clinical signs of infection  MSK:  R - s/p prior right 1-5 digit amp through MPJ level.   L -  No gross deformities. Compartments soft, non-tender, compressible  Neuro: R - Gross sensation diminshed. Gross motor function intact   L - Gross sensation diminished. Gross motor function intact    ASSESSMENT/PLAN OF CARE 82 y.o. male with PMHx significant for  poorly controlled insulin-dependent diabetes mellitus, PAD, atrial flutter on Xarelto,  with Right foot diabetic foot infection 2/2 chronic ulceration plantar 1st met head with concern for underlying osteomyelitis  WBC 4.9 ESR/CRP:p MRI R foot :p  -Discussed with pt treatment options. Recommend revision ampuation of right foot to remove infection and hopefully close the wound/amp site. MRI orderd - NPO past 0800 5/15 for OR later in the day for revision transmetatarsal amputation of the right foot - Continue IV abx broad spectrum pending further culture data - Anticoagulation: hold for OR tmrw, if patient is on a heparin  drip hold 4 hours prior to OR which is tentatively approx 1600 or later -  Wound care: none needed pre op - WB status: WBAT to Right foot - Will continue to follow   Thank you for the consult.  Please contact me directly with any questions or concerns.           Corinna Gab, DPM [facility]    [address] [place], [place] [address]                Office [phone]  Fax [phone]

## 2023-03-29 NOTE — Patient Instructions (Signed)
PLEASE GO TO [place] [facility]   Gangrene Gangrene is a condition that can happen when an area of your body does not get enough blood. Oxygen travels through blood, so less blood means less oxygen. Without oxygen, body tissue will die. Gangrene can affect any part of the body. External gangrene is most common. It affects your skin, such as on your arms and legs. Internal gangrene affects body parts inside your body.  Gangrene is an emergency. If you have symptoms of gangrene, get medical treatment right away. What are the causes? Gangrene is caused by conditions that cut off blood supply. These may include: Injuries. Blood vessel diseases. Infections. What increases the risk? You may be more likely to get gangrene if: You have just had surgery. You have a serious injury. You have other conditions. These may include: Diabetes. Obesity. A weak disease-fighting system (immune system). Atherosclerosis. This is when your arteries narrow after plaque builds up. Raynaud disease. This is a disease of the immune system that causes your arteries to narrow. Alcohol use disorder. This is a disorder where you may not be able to stop drinking alcohol. You use needles to put drugs in your body (inject drugs). What are the signs or symptoms? Symptoms depend on where the gangrene is. If you have external gangrene, you may have: Severe pain and then loss of feeling. Redness and swelling. A wound with bad-smelling drainage. Changes in the color of your skin. It may turn red, blue, black, or white. Fever and chills. If you have internal gangrene, you may have: Fever and chills. Confusion. Dizziness. Severe pain. Rapid heartbeat and breathing. Loss of appetite. Nausea or vomiting. How is this diagnosed? Gangrene may be diagnosed based on your symptoms and medical history. You may also have a physical exam and testing. Tests may include: Arteriogram. This is when dye is put into your blood vessels,  and X-rays are taken. Blood tests. These may be done to check for infection. Imaging tests, such as CT scans or MRIs. Culture. This is when a sample of wound drainage is tested for bacteria. Biopsy. This is when a sample of tissue is tested to see if the cells in the tissue have died. Surgery. This may be done to look for gangrene inside your body. How is this treated? Treatment should be started early so that the gangrene cannot spread to other parts of your body. In most cases, gangrene is treated in the hospital. Treatment may include: Antibiotics, if the gangrene was caused by an infection. Surgery. This may be: Debridement. This is surgery to remove dead tissue. You may need to have this surgery a few times. Bypass or angioplasty. These surgeries improve blood flow to the affected area. Amputation. This is surgery to remove a body part. It may be needed in very severe cases. Hyperbaric oxygen therapy. This is when you are given high levels of oxygen while in a pressurized chamber. It can improve the oxygen supply to the affected area. Supportive care to keep the rest of your body healthy. This may include: IV fluids and nutrients. Pain medicines. Blood thinners to prevent blood clots. Follow these instructions at home: Medicines Take over-the-counter and prescription medicines only as told by your health care provider. If you were prescribed antibiotics, take them as told by your health care provider. Do not stop using the antibiotic even if you start to feel better. Skin and wound care Clean any cuts or scratches with a solution that kills germs (antiseptic). Your  health care provider may recommend certain over-the-counter options. Follow instructions from your health care provider about how to take care of your wound. Make sure you: Wash your hands with soap and water for at least 20 seconds before and after you change your bandage (dressing). If soap and water are not available, use  hand sanitizer. Cover any open wounds with a dressing. Change your dressing as told by your health care provider. Check your wound every day for signs of infection, such as: More redness, swelling, or pain. More fluid or blood. Warmth. Pus or a bad smell. If you have diabetes or a blood vessel disease, check your feet every day for signs of injury or infection. Lifestyle Do not use any products that contain nicotine or tobacco. These products include cigarettes, chewing tobacco, and vaping devices, such as e-cigarettes. These can delay wound healing. If you need help quitting, ask your health care provider. Do not drink alcohol if your health care provider tells you not to drink. Eat a healthy diet. Maintain a healthy weight. Get some exercise on most days of the week. Ask your health care provider what forms of exercise may be best for you. General instructions Do not take baths, swim, or use a hot tub until your health care provider approves. Ask your health care provider if you may take showers. You may only be allowed to take sponge baths. Keep all follow-up visits. Your health care provider will need to monitor how your wound heals. Contact a health care provider if: You have a wound or sore that is not getting better. You have a wound or sore that shows signs of infection. You have numbness at the site of a skin infection or wound. An area of your skin turns white, red, blue, or black. Get help right away if: You have pain that quickly gets worse near a skin infection or wound. You have unexplained: Fever. Chills. Confusion. Fainting. These symptoms may be an emergency. Get help right away. Call 911. Do not wait to see if the symptoms will go away. Do not drive yourself to the hospital. This information is not intended to replace advice given to you by your health care provider. Make sure you discuss any questions you have with your health care provider. Document Revised:  05/12/2022 Document Reviewed: 05/12/2022 Elsevier Patient Education  2023 [employer].

## 2023-03-29 NOTE — Progress Notes (Signed)
Subjective:  Patient ID: Jason [name], male    DOB: 1941/06/02  Age: 82 y.o. MRN: [id number]  CC: Annual Exam, Hypertension, Hyperlipidemia, and Diabetes   HPI Jason [name] presents for a CPX and f/up --  He complains of a 3 week history painless ulcer on the bottom of his right foot.  No facility-administered medications prior to visit.   Outpatient Medications Prior to Visit  Medication Sig Dispense Refill   B-D UF III MINI PEN NEEDLES 31G X 5 MM MISC      Bempedoic Acid-Ezetimibe (NEXLIZET) 180-10 MG TABS Take 1 tablet by mouth daily. 90 tablet 3   Continuous Blood Gluc Receiver (FREESTYLE LIBRE 2 READER) DEVI 1 Act by Does not apply route daily. 2 each 5   Continuous Blood Gluc Sensor (FREESTYLE LIBRE 2 SENSOR) MISC 1 Act by Does not apply route daily. 2 each 5   Glucagon (GVOKE HYPOPEN 2-PACK) 1 MG/0.2ML SOAJ Inject 1 Act into the skin daily as needed. 2 mL 5   insulin glargine, 2 Unit Dial, (TOUJEO MAX SOLOSTAR) 300 UNIT/ML Solostar Pen Inject 30 Units into the skin daily. 12 mL 0   Insulin Pen Needle 32G X 6 MM MISC 1 Act by Does not apply route in the morning and at bedtime. 100 each 0   metFORMIN (GLUCOPHAGE) 500 MG tablet Take 2 tablets (1,000 mg total) by mouth 2 (two) times daily with a meal. 360 tablet 0   rivaroxaban (XARELTO) 20 MG TABS tablet Take 1 tablet (20 mg total) by mouth daily. 90 tablet 3   simvastatin (ZOCOR) 40 MG tablet Take 1 tablet (40 mg total) by mouth daily at 6 PM. 90 tablet 3   tirzepatide (MOUNJARO) 5 MG/0.5ML Pen Inject 5 mg into the skin once a week. 4 mL 0   Wound Dressings (MEDIHONEY WOUND/BURN DRESSING) GEL Apply to affected are 3 times a week, and cover with sterile dressing. 15 mL 2    ROS Review of Systems  Constitutional:  Positive for unexpected weight change (wt loss). Negative for diaphoresis and fatigue.  HENT: Negative.    Eyes: Negative.   Respiratory:  Negative for cough, chest tightness, shortness of breath and  wheezing.   Cardiovascular:  Negative for chest pain, palpitations and leg swelling.  Gastrointestinal:  Positive for constipation. Negative for abdominal pain, blood in stool, diarrhea, nausea and vomiting.  Endocrine: Negative.   Genitourinary: Negative.  Negative for difficulty urinating.  Musculoskeletal:  Positive for gait problem. Negative for arthralgias and myalgias.  Skin: Negative.   Neurological:  Negative for dizziness and weakness.  Hematological:  Negative for adenopathy. Does not bruise/bleed easily.  Psychiatric/Behavioral: Negative.      Objective:  BP 122/64 (BP Location: Left Arm, Patient Position: Sitting, Cuff Size: Large)   Pulse 72   Temp 98.2 F (36.8 C) (Oral)   Ht 6\' 8"  (2.032 m)   Wt 260 lb (117.9 kg)   SpO2 95%   BMI 28.56 kg/m   BP Readings from Last 3 Encounters:  03/29/23 (!) 144/79  03/29/23 122/64  12/28/22 134/72    Wt Readings from Last 3 Encounters:  03/29/23 260 lb (117.9 kg)  12/28/22 276 lb 12.8 oz (125.6 kg)  11/18/22 269 lb (122 kg)    Physical Exam Vitals reviewed.  Constitutional:      General: He is not in acute distress.    Appearance: Normal appearance. He is ill-appearing. He is not toxic-appearing or diaphoretic.  HENT:     Mouth/Throat:     Mouth: Mucous membranes are moist.  Eyes:     General: No scleral icterus.    Conjunctiva/sclera: Conjunctivae normal.  Cardiovascular:     Rate and Rhythm: Normal rate and regular rhythm.     Heart sounds: Murmur heard.     Systolic murmur is present with a grade of 1/6.     No diastolic murmur is present.     No friction rub. No gallop.  Pulmonary:     Effort: Pulmonary effort is normal.     Breath sounds: No stridor. No wheezing, rhonchi or rales.  Abdominal:     General: Abdomen is flat.     Palpations: There is no mass.     Tenderness: There is no abdominal tenderness. There is no guarding or rebound.     Hernia: No hernia is present.  Musculoskeletal:         General: Normal range of motion.     Cervical back: Neck supple.     Right lower leg: No edema.     Left lower leg: No edema.  Feet:     Comments: Plantar side, right foot, under the first MTP joint there is a 3 CM deep ulcer filled with necrotic tissue.  The surrounding areas are dusky and there is a foul odor.  There is no exudate. Lymphadenopathy:     Cervical: No cervical adenopathy.  Skin:    General: Skin is warm and dry.  Neurological:     General: No focal deficit present.     Mental Status: He is alert. Mental status is at baseline.  Psychiatric:        Mood and Affect: Mood normal.        Behavior: Behavior normal.     Lab Results  Component Value Date   WBC 4.9 03/29/2023   HGB 13.8 03/29/2023   HCT 41.7 03/29/2023   PLT 207 03/29/2023   GLUCOSE 475 (H) 03/29/2023   CHOL 188 03/29/2023   TRIG 118.0 03/29/2023   HDL 50.40 03/29/2023   LDLCALC 114 (H) 03/29/2023   ALT 15 03/29/2023   AST 13 (L) 03/29/2023   NA 130 (L) 03/29/2023   K 4.3 03/29/2023   CL 96 (L) 03/29/2023   CREATININE 1.19 03/29/2023   BUN 15 03/29/2023   CO2 26 03/29/2023   TSH 1.47 03/29/2023   PSA 1.13 04/18/2019   INR 1.05 10/17/2012   HGBA1C 13.3 (H) 03/29/2023   MICROALBUR 3.5 (H) 03/29/2023    VAS [place] ABI WITH/WO TBI  Result Date: 11/18/2022  LOWER EXTREMITY DOPPLER STUDY Patient Name:  Jason [name]  Date of Exam:   11/18/2022 Medical Rec #: [id number]        Accession #:    [id number] Date of Birth: 12-05-1940         Patient Gender: M Patient Age:   39 years Exam Location:  [facility] Procedure:      VAS [place] ABI WITH/WO TBI Referring Phys: Sherald Hess --------------------------------------------------------------------------------  Indications: Peripheral artery disease. right transmetatarsal amputation High Risk Factors: Hyperlipidemia, Diabetes.  Comparison Study: 01/14/22- ABI/TBI- right=1.35/amp, left=1.37/1.12 Performing Technologist: Gertie Fey MHA, RVT,  RDCS, RDMS  Examination Guidelines: A complete evaluation includes at minimum, Doppler waveform signals and systolic blood pressure reading at the level of bilateral brachial, anterior tibial, and posterior tibial arteries, when vessel segments are accessible. Bilateral testing is considered an integral part of a complete examination. Photoelectric Plethysmograph (  PPG) waveforms and toe systolic pressure readings are included as required and additional duplex testing as needed. Limited examinations for reoccurring indications may be performed as noted.  ABI Findings: +--------+------------------+-----+---------+--------+ Right   Rt Pressure (mmHg)IndexWaveform Comment  +--------+------------------+-----+---------+--------+ [id number]                                      +--------+------------------+-----+---------+--------+ PTA     183               1.39 triphasic         +--------+------------------+-----+---------+--------+ DP      169               1.28 biphasic          +--------+------------------+-----+---------+--------+ +--------+------------------+-----+--------+-------+ Left    Lt Pressure (mmHg)IndexWaveformComment +--------+------------------+-----+--------+-------+ [id number]                                    +--------+------------------+-----+--------+-------+ PTA     210               1.59 biphasic        +--------+------------------+-----+--------+-------+ DP      209               1.58 biphasic        +--------+------------------+-----+--------+-------+ +-------+-----------+-----------+------------+------------+ ABI/TBIToday's ABIToday's TBIPrevious ABIPrevious TBI +-------+-----------+-----------+------------+------------+ Right  1.39       Amputation 1.35        Amputation   +-------+-----------+-----------+------------+------------+ Left   1.59       absent     1.37        1.12          +-------+-----------+-----------+------------+------------+ Arterial wall calcification precludes accurate ankle pressures and ABIs. Right ABIs and TBIs appear essentially unchanged compared to prior study on 01/14/22. Left TBIs appear decreased compared to prior study on 01/14/22.  Summary: Right: Resting right ankle-brachial index is within normal range. Left: Resting left ankle-brachial index is elevated, suggestive of medial arterial calcification. Absent left great toe PPG waveform. *See table(s) above for measurements and observations.  Electronically signed by Waverly Ferrari MD on 11/18/2022 at 4:08:16 PM.    Final    DG Foot Complete Right  Result Date: 03/29/2023 CLINICAL DATA:  Right foot ulcer.  Evaluate for osteomyelitis. EXAM: RIGHT FOOT COMPLETE - 3+ VIEW COMPARISON:  Right foot radiographs 03/29/2023, earlier same day. Right foot radiographs 03/31/2021 FINDINGS: Redemonstration of amputation of the first through fifth toe phalanges and the distal aspect of the second through fourth metatarsals. Mild first through fifth tarsometatarsal joint space narrowing. Moderate to high-grade dorsal talonavicular joint space narrowing and osteophytosis. Mild-to-moderate dorsal tarsometatarsal degenerative osteophytosis on lateral view. Moderate to large plantar and mild to moderate posterior calcaneal heel spurs. Diffuse soft tissue edema. Soft tissue air extending from the distal medial forefoot to the distal medial great toe metatarsal head. No cortical erosion is seen. IMPRESSION: 1. No significant change from radiographs performed earlier today. 2. Status post amputation of the first through fifth toe phalanges and distal aspect of the second through fourth metatarsals. 3. Soft tissue air at the distal medial aspect of the forefoot extending to the distal medial aspect of the great toe metatarsal head. No cortical erosion to indicate radiographic evidence of osteomyelitis. Electronically Signed   By: Neita Garnet M.D.   On: 03/29/2023  16:58   VAS [place] ABI WITH/WO TBI  Result Date: 03/29/2023  LOWER EXTREMITY DOPPLER STUDY Patient Name:  Jason [name]  Date of Exam:   03/29/2023 Medical Rec #: [id number]        Accession #:    [id number] Date of Birth: 11/12/1941         Patient Gender: M Patient Age:   74 years Exam Location:  [facility] Procedure:      VAS [place] ABI WITH/WO TBI Referring Phys: DAVID YAO --------------------------------------------------------------------------------  Indications: Ulceration. right transmetatarsal amputation High Risk Factors: Hyperlipidemia, Diabetes.  Comparison Study: 11/18/2022 - Right: Resting right ankle-brachial index is within                   normal range.                    Left:                    Resting left ankle-brachial index is elevated, suggestive of                   medial                   arterial calcification. Absent left great toe PPG waveform. Performing Technologist: Olen Cordial RVT  Examination Guidelines: A complete evaluation includes at minimum, Doppler waveform signals and systolic blood pressure reading at the level of bilateral brachial, anterior tibial, and posterior tibial arteries, when vessel segments are accessible. Bilateral testing is considered an integral part of a complete examination. Photoelectric Plethysmograph (PPG) waveforms and toe systolic pressure readings are included as required and additional duplex testing as needed. Limited examinations for reoccurring indications may be performed as noted.  ABI Findings: +---------+------------------+-----+-----------+----------+ Right    Rt Pressure (mmHg)IndexWaveform   Comment    +---------+------------------+-----+-----------+----------+ Brachial 108                    triphasic             +---------+------------------+-----+-----------+----------+ PTA      169               1.46 multiphasic           +---------+------------------+-----+-----------+----------+ DP        125               1.08 multiphasic           +---------+------------------+-----+-----------+----------+ Great Toe                                  Amputation +---------+------------------+-----+-----------+----------+ +---------+------------------+-----+-----------+-------+ Left     Lt Pressure (mmHg)IndexWaveform   Comment +---------+------------------+-----+-----------+-------+ Brachial 116                    triphasic          +---------+------------------+-----+-----------+-------+ PTA      175               1.51 multiphasic        +---------+------------------+-----+-----------+-------+ DP       189               1.63 multiphasic        +---------+------------------+-----+-----------+-------+ Great Toe51                0.44                    +---------+------------------+-----+-----------+-------+ +-------+-----------+-----------+------------+------------+  ABI/TBIToday's ABIToday's TBIPrevious ABIPrevious TBI +-------+-----------+-----------+------------+------------+ Right  1.46       Amputation 1.39        Amputation   +-------+-----------+-----------+------------+------------+ Left   1.63       0.44       1.53        1.12         +-------+-----------+-----------+------------+------------+   Summary: Right: Resting right ankle-brachial index indicates noncompressible right lower extremity arteries. Unable to obtain TBI due to great toe amputation. Left: Resting left ankle-brachial index indicates noncompressible left lower extremity arteries. The left toe-brachial index is abnormal. *See table(s) above for measurements and observations.     Preliminary    DG Foot Complete Right  Result Date: 03/29/2023 CLINICAL DATA:  Right foot pain for 3 weeks.  No injury. EXAM: RIGHT FOOT COMPLETE - 3+ VIEW COMPARISON:  03/31/2021. FINDINGS: Previous amputation of all toes, which was present on the prior study. No fracture. No bone lesion. No bone  resorption to suggest osteomyelitis. Joints are normally spaced and aligned. Mild dorsal spurring at the cuneiform metatarsal articulation on the lateral view and talar navicular articulation. Moderate-sized plantar and dorsal calcaneal spurs. Diffuse soft tissue edema. There is soft tissue air extending from the distal medial foot to the medial aspect of the first metatarsal head. IMPRESSION: 1. No fracture or dislocation. 2. No evidence of osteomyelitis. 3. Soft tissue air extending anteriorly from the first metatarsal head. Diffuse soft tissue swelling. Electronically Signed   By: Amie Portland M.D.   On: 03/29/2023 14:45     Assessment & Plan:   Atherosclerosis of aorta (HCC)- Risk factor modifications addressed. -     Lipid panel; Future  Chronic idiopathic constipation-Labs are negative for secondary causes. -     TSH; Future -     Basic metabolic panel; Future  Encounter for general adult medical examination with abnormal findings- Exam completed, labs reviewed, vaccines reviewed, no cancer screenings indicated, patient education was given.  Hyperlipidemia with target LDL less than 70 - He has not achieved his LDL goal.  Will restart the statin. -     Lipid panel; Future -     TSH; Future -     Hepatic function panel; Future  Insulin-requiring or dependent type II diabetes mellitus (HCC)- His blood sugar is not adequately well-controlled. -     Microalbumin / creatinine urine ratio; Future -     Urinalysis, Routine w reflex microscopic; Future -     Hemoglobin A1c; Future -     HM Diabetes Foot Exam  Thiamine deficiency -     CBC with Differential/Platelet; Future  PAD (peripheral artery disease) (HCC) -     Lipid panel; Future  Diabetic ulcer of right midfoot associated with type 2 diabetes mellitus, with muscle involvement without evidence of necrosis (HCC)- Plain film is negative for osteomyelitis but there is an accumulation of subcutaneous air suspicious for gangrene. -      DG Foot Complete Right; Future  Gangrene of right foot (HCC)- He was sent to the ED to consider emergent treatment options.     Follow-up: Return if symptoms worsen or fail to improve.  Sanda Linger, MD

## 2023-03-30 ENCOUNTER — Encounter (HOSPITAL_COMMUNITY)
Admission: EM | Disposition: A | Discharge status: Home Health | Payer: Self-pay | Source: Home / Self Care | Attending: Internal Medicine

## 2023-03-30 ENCOUNTER — Inpatient Hospital Stay (HOSPITAL_COMMUNITY): Payer: Medicare HMO

## 2023-03-30 ENCOUNTER — Inpatient Hospital Stay (HOSPITAL_COMMUNITY): Payer: Medicare HMO | Admitting: Certified Registered"

## 2023-03-30 ENCOUNTER — Encounter (HOSPITAL_COMMUNITY): Payer: Self-pay | Admitting: Family Medicine

## 2023-03-30 DIAGNOSIS — G4733 Obstructive sleep apnea (adult) (pediatric): Secondary | ICD-10-CM | POA: Diagnosis not present

## 2023-03-30 DIAGNOSIS — M86271 Subacute osteomyelitis, right ankle and foot: Secondary | ICD-10-CM | POA: Diagnosis not present

## 2023-03-30 DIAGNOSIS — L089 Local infection of the skin and subcutaneous tissue, unspecified: Secondary | ICD-10-CM | POA: Diagnosis not present

## 2023-03-30 DIAGNOSIS — E1169 Type 2 diabetes mellitus with other specified complication: Secondary | ICD-10-CM

## 2023-03-30 DIAGNOSIS — R739 Hyperglycemia, unspecified: Secondary | ICD-10-CM | POA: Diagnosis not present

## 2023-03-30 DIAGNOSIS — L97514 Non-pressure chronic ulcer of other part of right foot with necrosis of bone: Secondary | ICD-10-CM | POA: Diagnosis not present

## 2023-03-30 DIAGNOSIS — E11621 Type 2 diabetes mellitus with foot ulcer: Secondary | ICD-10-CM

## 2023-03-30 DIAGNOSIS — Z794 Long term (current) use of insulin: Secondary | ICD-10-CM

## 2023-03-30 DIAGNOSIS — M869 Osteomyelitis, unspecified: Secondary | ICD-10-CM

## 2023-03-30 DIAGNOSIS — L97519 Non-pressure chronic ulcer of other part of right foot with unspecified severity: Secondary | ICD-10-CM | POA: Diagnosis not present

## 2023-03-30 DIAGNOSIS — E1165 Type 2 diabetes mellitus with hyperglycemia: Secondary | ICD-10-CM

## 2023-03-30 DIAGNOSIS — Z7984 Long term (current) use of oral hypoglycemic drugs: Secondary | ICD-10-CM

## 2023-03-30 DIAGNOSIS — E1151 Type 2 diabetes mellitus with diabetic peripheral angiopathy without gangrene: Secondary | ICD-10-CM

## 2023-03-30 HISTORY — PX: AMPUTATION: SHX166

## 2023-03-30 LAB — PREALBUMIN: Prealbumin: 21 mg/dL (ref 18–38)

## 2023-03-30 LAB — GLUCOSE, CAPILLARY
Glucose-Capillary: 298 mg/dL — ABNORMAL HIGH (ref 70–99)
Glucose-Capillary: 146 mg/dL — ABNORMAL HIGH (ref 70–99)
Glucose-Capillary: 324 mg/dL — ABNORMAL HIGH (ref 70–99)
Glucose-Capillary: 227 mg/dL — ABNORMAL HIGH (ref 70–99)
Glucose-Capillary: 146 mg/dL — ABNORMAL HIGH (ref 70–99)
Glucose-Capillary: 329 mg/dL — ABNORMAL HIGH (ref 70–99)

## 2023-03-30 LAB — BASIC METABOLIC PANEL
Anion gap: 8 (ref 5–15)
BUN: 13 mg/dL (ref 8–23)
CO2: 26 mmol/L (ref 22–32)
Calcium: 8.7 mg/dL — ABNORMAL LOW (ref 8.9–10.3)
Chloride: 100 mmol/L (ref 98–111)
Creatinine, Ser: 0.93 mg/dL (ref 0.61–1.24)
GFR, Estimated: >60 mL/min (ref >60)
Glucose, Bld: 150 mg/dL — ABNORMAL HIGH (ref 70–99)
Potassium: 3.6 mmol/L (ref 3.5–5.1)
Sodium: 134 mmol/L — ABNORMAL LOW (ref 135–145)

## 2023-03-30 LAB — CBC
HCT: 39.5 % (ref 39.0–52.0)
Hemoglobin: 13 g/dL (ref 13.0–17.0)
MCH: 29.7 pg (ref 26.0–34.0)
MCHC: 32.9 g/dL (ref 30.0–36.0)
MCV: 90.2 fL (ref 80.0–100.0)
Platelets: 191 10*3/uL (ref 150–400)
RBC: 4.38 MIL/uL (ref 4.22–5.81)
RDW: 11.6 % (ref 11.5–15.5)
WBC: 4.4 10*3/uL (ref 4.0–10.5)
nRBC: 0 % (ref 0.0–0.2)

## 2023-03-30 LAB — SURGICAL PCR SCREEN
MRSA, PCR: POSITIVE — AB
Staphylococcus aureus: POSITIVE — AB

## 2023-03-30 LAB — C-REACTIVE PROTEIN: CRP: <0.5 mg/dL (ref <1.0)

## 2023-03-30 LAB — AEROBIC/ANAEROBIC CULTURE W GRAM STAIN (SURGICAL/DEEP WOUND)

## 2023-03-30 SURGERY — AMPUTATION, FOOT, PARTIAL
Anesthesia: Monitor Anesthesia Care | Site: Foot | Laterality: Right

## 2023-03-30 MED ORDER — AMISULPRIDE (ANTIEMETIC) 5 MG/2ML IV SOLN: 10.0000 mg | Freq: Once | INTRAVENOUS | Status: DC | PRN

## 2023-03-30 MED ORDER — PROPOFOL 10 MG/ML IV BOLUS
INTRAVENOUS | Status: DC | PRN
  Administered 2023-03-30: 20 mg via INTRAVENOUS

## 2023-03-30 MED ORDER — OXYCODONE HCL 5 MG/5ML PO SOLN: 5.0000 mg | Freq: Once | ORAL | Status: DC | PRN

## 2023-03-30 MED ORDER — CHLORHEXIDINE GLUCONATE 0.12 % MT SOLN
15.0000 mL | Freq: Once | OROMUCOSAL | Status: AC
  Administered 2023-03-30: 15 mL via OROMUCOSAL

## 2023-03-30 MED ORDER — ONDANSETRON HCL 4 MG/2ML IJ SOLN: 4.0000 mg | Freq: Once | INTRAMUSCULAR | Status: DC | PRN

## 2023-03-30 MED ORDER — INSULIN GLARGINE-YFGN 100 UNIT/ML ~~LOC~~ SOLN
10.0000 [IU] | Freq: Two times a day (BID) | SUBCUTANEOUS | Status: DC
  Administered 2023-03-30 (×2): 10 [IU] via SUBCUTANEOUS
  Filled 2023-03-30 (×3): qty 0.1

## 2023-03-30 MED ORDER — ROPIVACAINE HCL 5 MG/ML IJ SOLN
INTRAMUSCULAR | Status: DC | PRN
  Administered 2023-03-30: 40 mL via PERINEURAL

## 2023-03-30 MED ORDER — DEXAMETHASONE SODIUM PHOSPHATE 10 MG/ML IJ SOLN
INTRAMUSCULAR | Status: DC | PRN
  Administered 2023-03-30: 10 mg

## 2023-03-30 MED ORDER — HYDROMORPHONE HCL 1 MG/ML IJ SOLN: 0.2500 mg | INTRAMUSCULAR | Status: DC | PRN

## 2023-03-30 MED ORDER — ACETAMINOPHEN 500 MG PO TABS: 1000.0000 mg | ORAL_TABLET | Freq: Once | ORAL | Status: DC

## 2023-03-30 MED ORDER — ENOXAPARIN SODIUM 120 MG/0.8ML IJ SOSY
120.0000 mg | PREFILLED_SYRINGE | Freq: Two times a day (BID) | INTRAMUSCULAR | Status: DC
  Administered 2023-03-31 (×2): 120 mg via SUBCUTANEOUS
  Filled 2023-03-30 (×3): qty 0.8

## 2023-03-30 MED ORDER — FENTANYL CITRATE (PF) 100 MCG/2ML IJ SOLN
INTRAMUSCULAR | Status: AC
  Filled 2023-03-30: qty 2

## 2023-03-30 MED ORDER — PROPOFOL 500 MG/50ML IV EMUL
INTRAVENOUS | Status: DC | PRN
  Administered 2023-03-30: 50 ug/kg/min via INTRAVENOUS

## 2023-03-30 MED ORDER — 0.9 % SODIUM CHLORIDE (POUR BTL) OPTIME
TOPICAL | Status: DC | PRN
  Administered 2023-03-30: 1000 mL

## 2023-03-30 MED ORDER — PROPOFOL 10 MG/ML IV BOLUS
INTRAVENOUS | Status: AC
  Filled 2023-03-30: qty 20

## 2023-03-30 MED ORDER — FENTANYL CITRATE PF 50 MCG/ML IJ SOSY
PREFILLED_SYRINGE | INTRAMUSCULAR | Status: AC
  Administered 2023-03-30: 50 ug
  Filled 2023-03-30: qty 2

## 2023-03-30 MED ORDER — VANCOMYCIN HCL 1000 MG IV SOLR
INTRAVENOUS | Status: AC
  Filled 2023-03-30: qty 20

## 2023-03-30 MED ORDER — OXYCODONE HCL 5 MG PO TABS: 5.0000 mg | ORAL_TABLET | Freq: Once | ORAL | Status: DC | PRN

## 2023-03-30 MED ORDER — BUPIVACAINE HCL (PF) 0.5 % IJ SOLN
INTRAMUSCULAR | Status: AC
  Filled 2023-03-30: qty 30

## 2023-03-30 MED ORDER — LACTATED RINGERS IV SOLN: INTRAVENOUS | Status: DC

## 2023-03-30 SURGICAL SUPPLY — 43 items
BAG COUNTER SPONGE SURGICOUNT (BAG) IMPLANT
BLADE AVERAGE 25X9 (BLADE) IMPLANT
BLADE OSC/SAG .038X5.5 CUT EDG (BLADE) IMPLANT
BLADE SURG 15 STRL LF DISP TIS (BLADE) IMPLANT
BLADE SURG 15 STRL SS (BLADE)
BNDG ELASTIC 4INX 5YD STR LF (GAUZE/BANDAGES/DRESSINGS) IMPLANT
BNDG ELASTIC 6INX 5YD STR LF (GAUZE/BANDAGES/DRESSINGS) ×1 IMPLANT
BNDG ELASTIC 6X10 VLCR STRL LF (GAUZE/BANDAGES/DRESSINGS) IMPLANT
BNDG STRETCH 4X75 STRL LF (GAUZE/BANDAGES/DRESSINGS) ×1 IMPLANT
CLEANER TIP ELECTROSURG 2X2 (MISCELLANEOUS) IMPLANT
CNTNR URN SCR LID CUP LEK RST (MISCELLANEOUS) IMPLANT
CONT SPEC 4OZ STRL OR WHT (MISCELLANEOUS) ×2
COVER SURGICAL LIGHT HANDLE (MISCELLANEOUS) ×1 IMPLANT
CUFF TOURN SGL QUICK 18 (TOURNIQUET CUFF) IMPLANT
DRSG ADAPTIC 3X8 NADH LF (GAUZE/BANDAGES/DRESSINGS) IMPLANT
GAUZE PAD ABD 7.5X8 STRL (GAUZE/BANDAGES/DRESSINGS) IMPLANT
GAUZE SPONGE 4X4 12PLY STRL (GAUZE/BANDAGES/DRESSINGS) ×1 IMPLANT
GAUZE XEROFORM 1X8 LF (GAUZE/BANDAGES/DRESSINGS) ×1 IMPLANT
GLOVE BIO SURGEON STRL SZ7.5 (GLOVE) ×1 IMPLANT
GLOVE BIOGEL PI IND STRL 8 (GLOVE) ×1 IMPLANT
GOWN SPEC L4 XLG W/TWL (GOWN DISPOSABLE) ×1 IMPLANT
HANDPIECE INTERPULSE COAX TIP (DISPOSABLE)
KIT BASIN OR (CUSTOM PROCEDURE TRAY) ×1 IMPLANT
KIT TURNOVER KIT A (KITS) IMPLANT
MICROMATRIX 500MG (Tissue) ×1 IMPLANT
MICROMATRIX STANDARD PARTICULATE IMPLANT
NDL HYPO 25X1 1.5 SAFETY (NEEDLE) ×1 IMPLANT
NEEDLE HYPO 25X1 1.5 SAFETY (NEEDLE) IMPLANT
PACK ORTHO EXTREMITY (CUSTOM PROCEDURE TRAY) ×1 IMPLANT
PADDING UNDERCAST 2X4 STRL (CAST SUPPLIES) ×1 IMPLANT
PENCIL SMOKE EVACUATOR (MISCELLANEOUS) ×1 IMPLANT
SET HNDPC FAN SPRY TIP SCT (DISPOSABLE) IMPLANT
SOLUTION PARTIC MCRMTRX 500MG (Tissue) IMPLANT
SPIKE FLUID TRANSFER (MISCELLANEOUS) IMPLANT
STAPLER VISISTAT 35W (STAPLE) ×1 IMPLANT
SUT ETHILON 4 0 PS 2 18 (SUTURE) ×1 IMPLANT
SUT MNCRL AB 4-0 PS2 18 (SUTURE) ×1 IMPLANT
SUT PROLENE 2 0 CT 1 (SUTURE) IMPLANT
SYR 20ML LL LF (SYRINGE) IMPLANT
TOWEL OR 17X26 10 PK STRL BLUE (TOWEL DISPOSABLE) ×1 IMPLANT
TOWEL OR NON WOVEN STRL DISP B (DISPOSABLE) ×1 IMPLANT
UNDERPAD 30X36 HEAVY ABSORB (UNDERPADS AND DIAPERS) ×2 IMPLANT
YANKAUER SUCT BULB TIP 10FT TU (MISCELLANEOUS) ×1 IMPLANT

## 2023-03-30 NOTE — Progress Notes (Signed)
PODIATRY PROGRESS NOTE Patient Name: Jason [name]  DOB August 31, 1941 DOA 03/29/2023  Hospital Day: 2  Assessment:  82 y.o. male with PMHx significant for  poorly controlled insulin-dependent diabetes mellitus, PAD, atrial flutter on Xarelto,  with Right foot diabetic foot infection 2/2 chronic ulceration plantar 1st met head with concern for underlying osteomyelitis   WBC 4.9 ESR 29, CRP <0.5 MRI R foot : 1. Deep skin wound about the plantar aspect of the first metatarsal head with mild marrow edema about the plantar aspect of the first metatarsal head concerning for osteomyelitis. 2. No fluid collection or abscess. 3. Postsurgical changes with prior amputation of the right foot through the metatarsophalangeal joints of the first and fifth digit and through metatarsal heads of the second through fourth digits. 4. Edema of the plantar muscles and tendons suggesting diabetic myopathy/myositis.   Plan:  - NPO for OR for revision transmetatarsal amputation of the right foot - Continue IV abx broad spectrum pending further culture data - Anticoagulation: hold for OR, ok to restart tmrw per primary - Wound care: none needed pre op - WB status: NWB to R foot post op in CAM boot - Will continue to follow        Corinna Gab, DPM [facility]    Subjective:  Patient seen in pre op, discussed MRI findings, plan for OR. Denies concerns.   Objective:   Vitals:   03/30/23 0445 03/30/23 0907  BP: 107/63 116/62  Pulse: 63 (!) 59  Resp: 17 18  Temp: 98 F (36.7 C) 98 F (36.7 C)  SpO2: 97% 97%       Latest Ref Rng & Units 03/30/2023    3:41 AM 03/29/2023    4:00 PM 03/29/2023    2:12 PM  CBC  WBC 4.0 - 10.5 K/uL 4.4  4.9  5.3   Hemoglobin 13.0 - 17.0 g/dL 16.1  09.6  04.5   Hematocrit 39.0 - 52.0 % 39.5  41.7  42.9   Platelets 150 - 400 K/uL 191  207  220.0        Latest Ref Rng & Units 03/30/2023    3:41 AM 03/29/2023    4:00 PM 03/29/2023    2:12 PM   BMP  Glucose 70 - 99 mg/dL 409  811  914   BUN 8 - 23 mg/dL 13  15  14    Creatinine 0.61 - 1.24 mg/dL 7.82  9.56  2.13   Sodium 135 - 145 mmol/L 134  130  131   Potassium 3.5 - 5.1 mmol/L 3.6  4.3  4.4   Chloride 98 - 111 mmol/L 100  96  94   CO2 22 - 32 mmol/L 26  26  28    Calcium 8.9 - 10.3 mg/dL 8.7  8.9  9.5     General: AAOx3, NAD Lower Extremity Exam Vasc:     R - PT palpable, DP palpable. Cap refill < 3 sec to digits               L - PT palpable, DP palpable. Cap refill <3 sec to digits   Derm:    R foot Wound: Located sub 1st met heat, measuring approximately 4 cm, probe to bone  no, odor yes, drainage yes                L - Normal temp/texture/turgor with no open lesion or clinical signs of infection   MSK:  R - s/p prior right 1-5 digit amp through MPJ level.                L -  No gross deformities. Compartments soft, non-tender, compressible   Neuro:   R - Gross sensation diminshed. Gross motor function intact                 L - Gross sensation diminished. Gross motor function intact   Radiology:  Results reviewed. See assessment for pertinent imaging results

## 2023-03-30 NOTE — Progress Notes (Signed)
PT Cancellation Note  Patient Details Name: NOU DEBOER MRN: [id number] DOB: 06-08-1941   Cancelled Treatment:    Reason Eval/Treat Not Completed: Patient at procedure or test/unavailable 82 yo male scheduled for R transmetatarsal amputation 03/30/2023 at 1515 PT to evaluate s/p sx.   Johnny Bridge, PT Acute Rehab  Jacqualyn Posey 03/30/2023, 12:19 PM

## 2023-03-30 NOTE — Progress Notes (Signed)
Orthopedic Tech Progress Note Patient Details:  Jason [name] [birth year] [id number]  Patient ID: Jason [name], male   DOB: Nov 08, 1941, 82 y.o.   MRN: [id number]  Jason [name] 03/30/2023, 3:43 PM Right cam walker applied in pacu

## 2023-03-30 NOTE — Plan of Care (Signed)
  Problem: Metabolic: Goal: Ability to maintain appropriate glucose levels will improve Outcome: Progressing   Problem: Clinical Measurements: Goal: Will remain free from infection Outcome: Progressing   Problem: Safety: Goal: Ability to remain free from injury will improve Outcome: Progressing   

## 2023-03-30 NOTE — Anesthesia Procedure Notes (Signed)
Date/Time: 03/30/2023 2:33 PM  Performed by: Florene Route, CRNA

## 2023-03-30 NOTE — Op Note (Signed)
Full Operative Report  Date of Operation: 3:18 PM, 03/30/2023   Patient: Jason [name] - 82 y.o. male  Surgeon: Pilar Plate, DPM   Assistant: None  Diagnosis: Osteomyelitis of first ray right, Ulcer with necrosis of bone, right foot  Procedure:  1. Transmetatarsal amputation, right foot    Anesthesia: Monitor Anesthesia Care  Lannie Fields, DO  Anesthesiologist: Lannie Fields, DO CRNA: Florene Route, CRNA   Estimated Blood Loss: 10 mL  Hemostasis: 1) Anatomical dissection, mechanical compression, electrocautery 2) Ankle tourniquet inflated to 250 m Hg  Implants: Implant Name Type Inv. Item Serial No. Manufacturer Lot No. LRB No. Used Action  [employer] STANDARD PARTICULATE   [id number]  [id number] Right 1 Implanted    Materials: Prolene, skin staples  Injectables: 1) Pre-operatively: Pre op nerve block per anesthesia 2) Post-operatively: None  Specimens: metatarsal for pathology, 1st met head bone culture   Antibiotics: Abx given as scheduled from the floor  Drains: None  Complications: Patient tolerated the procedure well without complication.   Findings: as below  Indications for Procedure: Jason [name] presents to Pilar Plate, DPM with a chief complaint of chronic ulceration to the plantar forefoot as well as concern for osteomyelitis on the right 1st met head. The patient has failed conservative treatments of various modalities. At this time the patient has elected to proceed with surgical correction. All alternatives, risks, and complications of the procedures were thoroughly explained to the patient. Patient exhibits appropriate understanding of all discussion points and informed consent was signed and obtained in the chart with no guarantees to surgical outcome given or implied.  Description of Procedure: Patient was brought to the operating room and placed on the operative table in the supine position. Patient was  secured to the table with safety belt, a contralateral SCD was placed, and all bony prominences were well padded. A surgical timeout was performed and all members of the operating room, the procedure, and the surgical site were identified. IV sedation and regional nerve block per anesthesia occurred. Local anesthetic as previously described was then injected about the operative field in a local infiltrative block.   A well padded pneumatic tourniquet was placed to the operative limb. The right lower extremity was then prepped and draped in the usual sterile manner  Attention was directed to the right lower extremity. A fish-mouth type incision was made proximal to the prior digit amputation site and encompassed the entire forefoot as well as the entire plantar medial ulceration. The full-thickness incision was made with a longer dorsal flap to allow for wound closure. The incision was continued through the soft tissue down to the shafts of the metatarsal bones. A key elevator was then used to free up the periosteum on all the metatarsal shafts. Using an sagittal saw, the metatarsals were cut in a dorsal distal to plantar proximal orientation. The first metatarsal was beveled so that the medial cortex was shorter than the lateral, and the fifth metatarsal was beveled so that the lateral cortex was shorter than the medial, thus less prominent. The amputation was done so that a metatarsal parabola was maintained.  The distal portion including all the metatarsal heads were freed from the metatarsal shaft and soft tissue attachments. The specimen was passed off the field and sent for gross pathology. All remaining non-viable and necrotic tissues were sharply resected and removed. Extensor and flexors tendons were grasped with a hemostat and cut proximally. Bleeding was noted prior to inflation  of the tourniquet. All distal portions of the metatarsals were than smoothed with a rasp. The surgical site was then flushed  with of saline under power pulse lavage.500 cc of cytal micromatrix graft was then placed in the amputation site to imrpove wound healing. The plantar flap was brought in approximation with the dorsal flap and the sutures material previously described was used for closure. Care was taken not to place the flaps under tension in order not to jeopardize the vascular supply.  The surgical site was then dressed with betadine adaptic, 4x4 kerlix and ace. The tourniquet was deflated after 22 minutes with immediate vascular return noted to the operative foot. The patient tolerated both the procedure and anesthesia well with vital signs stable throughout. The patient was transferred from the OR to recovery under the discretion of anesthesia.  Condition: Patient was taken to PACU in good condition and all vital signs stable and neurovascular status intact to the operative limb.  Discharge: Patient will be returned to floor for ongoing wound care and dressing change tomorrow or Friday as well as ongoing abx. Likely transition to PO abx at discharge, all infected tissue thought to be removed.   Jason [name], Jenelle Mages, DPM will follow the patient throughout the entire post-operative course and the patient is aware of all post-operative protocols in place. The patient will follow the protocol of rest, ice, and elevation. The patient will be NWB in a CAM boot to the operative limb until further instructed. The dressing is to remain clean, dry, and intact.   Jason [name], DPM

## 2023-03-30 NOTE — Anesthesia Postprocedure Evaluation (Signed)
Anesthesia Post Note  Patient: Jason [name]  Procedure(s) Performed: TRANSMETATARSAL AMPUTATION, RIGHT FOOT (Right: Foot)     Patient location during evaluation: PACU Anesthesia Type: Regional and MAC Level of consciousness: awake and alert Pain management: pain level controlled Vital Signs Assessment: post-procedure vital signs reviewed and stable Respiratory status: spontaneous breathing, nonlabored ventilation and respiratory function stable Cardiovascular status: blood pressure returned to baseline and stable Postop Assessment: no apparent nausea or vomiting Anesthetic complications: no   No notable events documented.  Last Vitals:  Vitals:   03/30/23 1526 03/30/23 1530  BP: 94/60 108/65  Pulse: (!) 56 (!) 56  Resp: 18 12  Temp: 36.4 C   SpO2: 98% 100%    Last Pain:  Vitals:   03/30/23 1530  TempSrc:   PainSc: 0-No pain                 Lannie Fields

## 2023-03-30 NOTE — Inpatient Diabetes Management (Signed)
Inpatient Diabetes Program Recommendations  AACE/ADA: New Consensus Statement on Inpatient Glycemic Control (2015)  Target Ranges:  Prepandial:   less than 140 mg/dL      Peak postprandial:   less than 180 mg/dL (1-2 hours)      Critically ill patients:  140 - 180 mg/dL   Lab Results  Component Value Date   GLUCAP 227 (H) 03/30/2023   HGBA1C 13.3 (H) 03/29/2023    Review of Glycemic Control  Diabetes history: DM2 Outpatient Diabetes medications: Toujeo 30 QHS, metformin 500 BID Current orders for Inpatient glycemic control: Semglee 10 BID, Novolog 0-6 Q4H  HgbA1C - 13.3% Surgery this afternoon  - revision of amputation of right foot for debridement.   Inpatient Diabetes Program Recommendations:    Will likely need up-titration of Semglee to 15 units BID Add Novolog 3 units TID with meals if eating > 50% (when diet starts)  Spoke with pt and wife at bedside regarding his diabetes control. Pt states he does not use his Josephine Igo and is not monitoring his blood sugars. Forgets to take Lantus at night. Wife has tried to remind him and help with monitoring and giving insulin, and he will not allow her to help. Wife states she cannot take care of him anymore. Needs assistance at home or pt needs to go to rehab/snf per wife. Pt is smiling during the entire conversation.   Spoke with nurse regarding above.  MD mentioned palliative care and hospice in note.   Continue to follow.  Thank you. Ailene Ards, RD, LDN, CDCES Inpatient Diabetes Coordinator [phone]

## 2023-03-30 NOTE — Anesthesia Procedure Notes (Signed)
Anesthesia Regional Block: Adductor canal block   Pre-Anesthetic Checklist: , timeout performed,  Correct Patient, Correct Site, Correct Laterality,  Correct Procedure, Correct Position, site marked,  Risks and benefits discussed,  Surgical consent,  Pre-op evaluation,  At surgeon's request and post-op pain management  Laterality: Right  Prep: Maximum Sterile Barrier Precautions used, chloraprep       Needles:  Injection technique: Single-shot  Needle Type: Echogenic Stimulator Needle     Needle Length: 9cm  Needle Gauge: 22     Additional Needles:   Procedures:,,,, ultrasound used (permanent image in chart),,    Narrative:  Start time: 03/30/2023 2:15 PM End time: 03/30/2023 2:18 PM Injection made incrementally with aspirations every 5 mL.  Performed by: Personally  Anesthesiologist: Lannie Fields, DO  Additional Notes: Monitors applied. No increased pain on injection. No increased resistance to injection. Injection made in 5cc increments. Good needle visualization. Patient tolerated procedure well.

## 2023-03-30 NOTE — Progress Notes (Signed)
[facility] PROGRESS NOTE    Progress Note  Jason [name]  ZOX:[id number] DOB: Apr 26, 1941 DOA: 03/29/2023 PCP: [name], MD     Brief Narrative:   Jason [name] is an 82 y.o. male past medical history significant for poorly controlled insulin-dependent diabetes mellitus, peripheral artery tibial disease, atrial flutter on Xarelto obstructive sleep apnea CPAP intolerant, severe mitral stenosis who has missed his follow-up appointments and is noncompliant with his medication.  Comes into the emergency room for nonhealing right foot ulcer.  Assessment/Plan:    Osteomyelitis diabetic foot ulcer/Right foot infection: Septic on admission. MRI of the right foot positive for osteomyelitis of the first metatarsal head no abscess with edema of the plantar muscles and tendons. Surgery consulted recommended revision of amputation of right foot for debridement. Anticoagulation was held, on IV antibiotics vancomycin Rocephin and Flagyl. ABI showed resting ankle-brachial index noncompressible of the right lower extremity.  Atrial flutter: Now in sinus rhythm, hold Xarelto although he has not taken it in several days. Has a history of noncompliance with his medication.  Insulin-dependent diabetes mellitus type 2: A1c is 13.3, he was started on long-acting insulin per sliding scale blood glucose erratic.  PAD (peripheral artery disease) (HCC) Continue statins.  History of severe mitral stenosis: He relates he has been followed by cardiology by this, he relates he will not want surgery for this. Try to call his wife's wishes and I was unsuccessful. I did talk to him about palliative care and hospice he would like to proceed with meeting with palliative care.  DVT prophylaxis: non Family Communication:none Status is: Inpatient Remains inpatient appropriate because: acute osteomyelitis    Code Status:     Code Status Orders  (From admission, onward)           Start      Ordered   03/29/23 1729  Full code  Continuous       Question:  By:  Answer:  Consent: discussion documented in EHR   03/29/23 1729           Code Status History     Date Active Date Inactive Code Status Order ID Comments User Context   06/13/2018 2202 06/15/2018 1712 Full Code [id number]  Colon Branch, MD Inpatient   11/16/2015 1310 11/19/2015 1429 Full Code [id number]  Osvaldo Shipper, MD Inpatient   11/05/2015 1643 11/07/2015 1356 Full Code [id number]  Rhetta Mura, MD Inpatient         IV Access:   Peripheral IV   Procedures and diagnostic studies:   MR FOOT RIGHT W WO CONTRAST  Result Date: 03/29/2023 CLINICAL DATA:  Concern for right foot osteomyelitis. Soft tissue infection. EXAM: MRI OF THE RIGHT FOREFOOT WITHOUT AND WITH CONTRAST TECHNIQUE: Multiplanar, multisequence MR imaging of the right forefoot was performed before and after the administration of intravenous contrast. CONTRAST:  10mL GADAVIST GADOBUTROL 1 MMOL/ML IV SOLN COMPARISON:  Radiograph dated Mar 29, 2023 FINDINGS: Bones/Joint/Cartilage Postsurgical changes with prior amputation of the right foot through the metatarsophalangeal joints of the first and fifth digit and through metatarsal heads of the second through fourth digits. There is mild edema about the plantar aspect of the first metatarsal head adjacent to the deep skin wound about the plantar aspect of the foot concerning for osteomyelitis. Ligaments Lisfranc ligament is intact. Muscles and Tendons Edema of the plantar muscles and tendons suggesting diabetic myopathy/myositis. No fluid collection or abscess. Soft tissues Deep skin wound about the plantar aspect of the first  metatarsal head. Mild generalized subcutaneous soft tissue edema about the dorsum of the foot. IMPRESSION: 1. Deep skin wound about the plantar aspect of the first metatarsal head with mild marrow edema about the plantar aspect of the first metatarsal head concerning for  osteomyelitis. 2. No fluid collection or abscess. 3. Postsurgical changes with prior amputation of the right foot through the metatarsophalangeal joints of the first and fifth digit and through metatarsal heads of the second through fourth digits. 4. Edema of the plantar muscles and tendons suggesting diabetic myopathy/myositis. Electronically Signed   By: Larose Hires D.O.   On: 03/29/2023 20:56   VAS [place] ABI WITH/WO TBI  Result Date: 03/29/2023  LOWER EXTREMITY DOPPLER STUDY Patient Name:  Jason [name]  Date of Exam:   03/29/2023 Medical Rec #: [id number]        Accession #:    [id number] Date of Birth: [birth year]         Patient Gender: M Patient Age:   82 years Exam Location:  [facility] Procedure:      VAS [place] ABI WITH/WO TBI Referring Phys: DAVID YAO --------------------------------------------------------------------------------  Indications: Ulceration. right transmetatarsal amputation High Risk Factors: Hyperlipidemia, Diabetes.  Comparison Study: 11/18/2022 - Right: Resting right ankle-brachial index is within                   normal range.                    Left:                    Resting left ankle-brachial index is elevated, suggestive of                   medial                   arterial calcification. Absent left great toe PPG waveform. Performing Technologist: Olen Cordial RVT  Examination Guidelines: A complete evaluation includes at minimum, Doppler waveform signals and systolic blood pressure reading at the level of bilateral brachial, anterior tibial, and posterior tibial arteries, when vessel segments are accessible. Bilateral testing is considered an integral part of a complete examination. Photoelectric Plethysmograph (PPG) waveforms and toe systolic pressure readings are included as required and additional duplex testing as needed. Limited examinations for reoccurring indications may be performed as noted.  ABI Findings:  +---------+------------------+-----+-----------+----------+ Right    Rt Pressure (mmHg)IndexWaveform   Comment    +---------+------------------+-----+-----------+----------+ Brachial 108                    triphasic             +---------+------------------+-----+-----------+----------+ PTA      169               1.46 multiphasic           +---------+------------------+-----+-----------+----------+ DP       125               1.08 multiphasic           +---------+------------------+-----+-----------+----------+ Great Toe                                  Amputation +---------+------------------+-----+-----------+----------+ +---------+------------------+-----+-----------+-------+ Left     Lt Pressure (mmHg)IndexWaveform   Comment +---------+------------------+-----+-----------+-------+ Brachial 116  triphasic          +---------+------------------+-----+-----------+-------+ PTA      175               1.51 multiphasic        +---------+------------------+-----+-----------+-------+ DP       189               1.63 multiphasic        +---------+------------------+-----+-----------+-------+ Great Toe51                0.44                    +---------+------------------+-----+-----------+-------+ +-------+-----------+-----------+------------+------------+ ABI/TBIToday's ABIToday's TBIPrevious ABIPrevious TBI +-------+-----------+-----------+------------+------------+ Right  1.46       Amputation 1.39        Amputation   +-------+-----------+-----------+------------+------------+ Left   1.63       0.44       1.53        1.12         +-------+-----------+-----------+------------+------------+  Summary: Right: Resting right ankle-brachial index indicates noncompressible right lower extremity arteries. Unable to obtain TBI due to great toe amputation. Left: Resting left ankle-brachial index indicates noncompressible left lower  extremity arteries. The left toe-brachial index is abnormal. *See table(s) above for measurements and observations.  Electronically signed by Waverly Ferrari MD on 03/29/2023 at 6:11:45 PM.    Final    DG Foot Complete Right  Result Date: 03/29/2023 CLINICAL DATA:  Right foot ulcer.  Evaluate for osteomyelitis. EXAM: RIGHT FOOT COMPLETE - 3+ VIEW COMPARISON:  Right foot radiographs 03/29/2023, earlier same day. Right foot radiographs 03/31/2021 FINDINGS: Redemonstration of amputation of the first through fifth toe phalanges and the distal aspect of the second through fourth metatarsals. Mild first through fifth tarsometatarsal joint space narrowing. Moderate to high-grade dorsal talonavicular joint space narrowing and osteophytosis. Mild-to-moderate dorsal tarsometatarsal degenerative osteophytosis on lateral view. Moderate to large plantar and mild to moderate posterior calcaneal heel spurs. Diffuse soft tissue edema. Soft tissue air extending from the distal medial forefoot to the distal medial great toe metatarsal head. No cortical erosion is seen. IMPRESSION: 1. No significant change from radiographs performed earlier today. 2. Status post amputation of the first through fifth toe phalanges and distal aspect of the second through fourth metatarsals. 3. Soft tissue air at the distal medial aspect of the forefoot extending to the distal medial aspect of the great toe metatarsal head. No cortical erosion to indicate radiographic evidence of osteomyelitis. Electronically Signed   By: Neita Garnet M.D.   On: 03/29/2023 16:58   DG Foot Complete Right  Result Date: 03/29/2023 CLINICAL DATA:  Right foot pain for 3 weeks.  No injury. EXAM: RIGHT FOOT COMPLETE - 3+ VIEW COMPARISON:  03/31/2021. FINDINGS: Previous amputation of all toes, which was present on the prior study. No fracture. No bone lesion. No bone resorption to suggest osteomyelitis. Joints are normally spaced and aligned. Mild dorsal spurring at  the cuneiform metatarsal articulation on the lateral view and talar navicular articulation. Moderate-sized plantar and dorsal calcaneal spurs. Diffuse soft tissue edema. There is soft tissue air extending from the distal medial foot to the medial aspect of the first metatarsal head. IMPRESSION: 1. No fracture or dislocation. 2. No evidence of osteomyelitis. 3. Soft tissue air extending anteriorly from the first metatarsal head. Diffuse soft tissue swelling. Electronically Signed   By: Amie Portland M.D.   On: 03/29/2023 14:45     Medical Consultants:   None.  Subjective:    Jason [name] no complaints pain is controlled.  Objective:    Vitals:   03/29/23 1953 03/29/23 2028 03/30/23 0120 03/30/23 0445  BP:  131/79 (!) 105/59 107/63  Pulse:  69 (!) 58 63  Resp:  17 17 17   Temp: (!) 97.5 F (36.4 C) 97.7 F (36.5 C) 97.8 F (36.6 C) 98 F (36.7 C)  TempSrc: Oral Oral Oral Oral  SpO2:  98% 98% 97%  Weight:  117.5 kg    Height:  6\' 8"  (2.032 m)     SpO2: 97 %   Intake/Output Summary (Last 24 hours) at 03/30/2023 0737 Last data filed at 03/30/2023 0600 Gross per 24 hour  Intake 2430.1 ml  Output 300 ml  Net 2130.1 ml   Filed Weights   03/29/23 2028  Weight: 117.5 kg    Exam: General exam: In no acute distress. Respiratory system: Good air movement and clear to auscultation. Cardiovascular system: S1 & S2 heard, RRR. No JVD.  Gastrointestinal system: Abdomen is nondistended, soft and nontender.  Extremities: No pedal edema. Skin: No rashes, lesions or ulcers Psychiatry: Judgement and insight appear normal. Mood & affect appropriate.    Data Reviewed:    Labs: Basic Metabolic Panel: Recent Labs  Lab 03/29/23 1412 03/29/23 1600 03/30/23 0341  NA 131* 130* 134*  K 4.4 4.3 3.6  CL 94* 96* 100  CO2 28 26 26   GLUCOSE 490* 475* 150*  BUN 14 15 13   CREATININE 1.33 1.19 0.93  CALCIUM 9.5 8.9 8.7*   GFR Estimated Creatinine Clearance: 92.2 mL/min (by C-G  formula based on SCr of 0.93 mg/dL). Liver Function Tests: Recent Labs  Lab 03/29/23 1412 03/29/23 1600  AST 12 13*  ALT 12 15  ALKPHOS 61 57  BILITOT 0.9 1.2  PROT 7.7 7.9  ALBUMIN 3.9 3.6   No results for input(s): "LIPASE", "AMYLASE" in the last 168 hours. No results for input(s): "AMMONIA" in the last 168 hours. Coagulation profile No results for input(s): "INR", "PROTIME" in the last 168 hours. COVID-19 Labs  Recent Labs    03/30/23 0341  CRP <0.5    Lab Results  Component Value Date   SARSCOV2NAA NOT DETECTED 07/21/2019   SARSCOV2NAA NEGATIVE 05/14/2019   SARSCOV2NAA NEGATIVE 05/07/2019    CBC: Recent Labs  Lab 03/29/23 1412 03/29/23 1600 03/30/23 0341  WBC 5.3 4.9 4.4  NEUTROABS 3.7  --   --   HGB 14.4 13.8 13.0  HCT 42.9 41.7 39.5  MCV 89.7 90.1 90.2  PLT 220.0 207 191   Cardiac Enzymes: No results for input(s): "CKTOTAL", "CKMB", "CKMBINDEX", "TROPONINI" in the last 168 hours. BNP (last 3 results) No results for input(s): "PROBNP" in the last 8760 hours. CBG: Recent Labs  Lab 03/29/23 1529 03/29/23 2350 03/30/23 0340 03/30/23 0730  GLUCAP 484* 239* 146* 324*   D-Dimer: No results for input(s): "DDIMER" in the last 72 hours. Hgb A1c: Recent Labs    03/29/23 1412  HGBA1C 13.3*   Lipid Profile: Recent Labs    03/29/23 1412  CHOL 188  HDL 50.40  LDLCALC 114*  TRIG 118.0  CHOLHDL 4   Thyroid function studies: Recent Labs    03/29/23 1412  TSH 1.47   Anemia work up: No results for input(s): "VITAMINB12", "FOLATE", "FERRITIN", "TIBC", "IRON", "RETICCTPCT" in the last 72 hours. Sepsis Labs: Recent Labs  Lab 03/29/23 1412 03/29/23 1600 03/30/23 0341  WBC 5.3 4.9 4.4   Microbiology Recent Results (from the past  240 hour(s))  Surgical PCR screen     Status: Abnormal   Collection Time: 03/29/23 11:58 PM   Specimen: Nasal Mucosa; Nasal Swab  Result Value Ref Range Status   MRSA, PCR POSITIVE (A) NEGATIVE Final    Staphylococcus aureus POSITIVE (A) NEGATIVE Final    Comment: (NOTE) The Xpert SA Assay (FDA approved for NASAL specimens in patients 44 years of age and older), is one component of a comprehensive surveillance program. It is not intended to diagnose infection nor to guide or monitor treatment. Performed at [facility], [address], [place], [place] [address]      Medications:    insulin aspart  0-6 Units Subcutaneous Q4H   insulin glargine-yfgn  15 Units Subcutaneous QHS   metroNIDAZOLE  500 mg Oral Q12H   mupirocin ointment  1 Application Nasal BID   simvastatin  40 mg Oral q1800   Continuous Infusions:  sodium chloride     cefTRIAXone (ROCEPHIN)  IV 2 g (03/29/23 2347)   vancomycin        LOS: 1 day   Marinda Elk  [facility]  03/30/2023, 7:37 AM

## 2023-03-30 NOTE — Anesthesia Procedure Notes (Signed)
Anesthesia Regional Block: Popliteal block   Pre-Anesthetic Checklist: , timeout performed,  Correct Patient, Correct Site, Correct Laterality,  Correct Procedure, Correct Position, site marked,  Risks and benefits discussed,  Surgical consent,  Pre-op evaluation,  At surgeon's request and post-op pain management  Laterality: Right  Prep: Maximum Sterile Barrier Precautions used, chloraprep       Needles:  Injection technique: Single-shot  Needle Type: Echogenic Stimulator Needle     Needle Length: 9cm  Needle Gauge: 22     Additional Needles:   Procedures:,,,, ultrasound used (permanent image in chart),,    Narrative:  Start time: 03/30/2023 2:12 PM End time: 03/30/2023 2:15 PM Injection made incrementally with aspirations every 5 mL.  Performed by: Personally  Anesthesiologist: Lannie Fields, DO  Additional Notes: Monitors applied. No increased pain on injection. No increased resistance to injection. Injection made in 5cc increments. Good needle visualization. Patient tolerated procedure well.

## 2023-03-30 NOTE — Anesthesia Preprocedure Evaluation (Addendum)
Anesthesia Evaluation  Patient identified by MRN, date of birth, ID band Patient awake    Reviewed: Allergy & Precautions, H&P , NPO status , Patient's Chart, lab work & pertinent test results  Airway Mallampati: II  TM Distance: >3 FB Neck ROM: Full    Dental  (+) Edentulous Upper, Edentulous Lower   Pulmonary asthma , sleep apnea (noncompliant w/  cpap) , COPD, former smoker   Pulmonary exam normal breath sounds clear to auscultation       Cardiovascular + Peripheral Vascular Disease  + dysrhythmias (xarelto) Atrial Fibrillation + Valvular Problems/Murmurs (severe MR, severe MS) MR  Rhythm:Regular Rate:Normal  Echo 2022   1. The mitral valve appears rheumatic with severely calcified anterior  and posterior leaflets. The leaflets demonstrate restricted movement in  systole/diastole. There is mixed severe mitral stenosis and severe mitral  regurgitation. The MG is 12 mmHG @  75 bpm. PHT 145 ms which equates to MVA 1.52 cm2. Direct 3D planimetry  shows MVA 1.40-1.50 cm2, consistent with severe mitral stenosis. There is  also severe, eccentric mitral regurgitation. This is due to severely  restricted movement of the posterior MV  leaflet in systole/diastole (IIIA). Regarding MR, 2D PISA 0.9 cm2, 2D ERO  0.37 cm2, R vol 54 cc. 3D VCA 0.35 cm2. There is systolic reversal of flow  in the left upper and left lower pulmonary veins. All consistent with  severe MR. Overall, there is  combined severe mitral stenosis and severe mitral regurgitation related to  rheumatic mitral valve disease with severe leaflet calcificaiton. The  mitral valve is rheumatic. Severe mitral valve regurgitation. Severe  mitral stenosis.   2. Left ventricular ejection fraction, by estimation, is 60 to 65%. The  left ventricle has normal function.   3. Right ventricular systolic function is normal. The right ventricular  size is normal.   4. Left atrial size  was severely dilated. No left atrial/left atrial  appendage thrombus was detected. The LAA emptying velocity was 55 cm/s.   5. Right atrial size was mildly dilated.   6. The aortic valve is tricuspid. There is mild calcification of the  aortic valve. Aortic valve regurgitation is not visualized.   7. There is mild (Grade II) layered plaque involving the descending  aorta.   8. Agitated saline contrast bubble study was positive with shunting  observed after >6 cardiac cycles suggestive of intrapulmonary shunting.      Neuro/Psych  PSYCHIATRIC DISORDERS Anxiety Depression    negative neurological ROS     GI/Hepatic Neg liver ROS,GERD  Controlled,,  Endo/Other  diabetes, Poorly Controlled, Type 2, Insulin Dependent, Oral Hypoglycemic Agents  A1c 13.3, pt states he has not been taking insulin at all at home   Renal/GU negative Renal ROS  negative genitourinary   Musculoskeletal  (+) Arthritis , Osteoarthritis,  Osteo R foot d/t medication noncompliance, A1c 13.3   Abdominal   Peds negative pediatric ROS (+)  Hematology negative hematology ROS (+) Hb 13   Anesthesia Other Findings Mounjaro- has not been taking  Reproductive/Obstetrics negative OB ROS                             Anesthesia Physical Anesthesia Plan  ASA: 4  Anesthesia Plan: MAC and Regional   Post-op Pain Management: Tylenol PO (pre-op)*   Induction:   PONV Risk Score and Plan: 2 and Propofol infusion and TIVA  Airway Management Planned: Natural Airway and Simple Face  Mask  Additional Equipment: None  Intra-op Plan:   Post-operative Plan:   Informed Consent: I have reviewed the patients History and Physical, chart, labs and discussed the procedure including the risks, benefits and alternatives for the proposed anesthesia with the patient or authorized representative who has indicated his/her understanding and acceptance.       Plan Discussed with: CRNA  Anesthesia  Plan Comments:        Anesthesia Quick Evaluation

## 2023-03-30 NOTE — Progress Notes (Signed)
Initial Nutrition Assessment  DOCUMENTATION CODES:   Not applicable  INTERVENTION:  - Advance diet as medically appropriate to Carb Modified after surgery.   - 1 packet Juven BID once diet advanced, each packet provides 95 calories, 2.5 grams of protein (collagen), and 9.8 grams of carbohydrate (3 grams sugar); also contains 7 grams of L-arginine and L-glutamine, 300 mg vitamin C, 15 mg vitamin E, 1.2 mcg vitamin B-12, 9.5 mg zinc, 200 mg calcium, and 1.5 g  Calcium Beta-hydroxy-Beta-methylbutyrate to support wound healing - ProSource Plus po TID, each supplement provides 100 kcal and 15 grams of protein.  - Diet education with handout provided for wound healing nutrition therapy.  - Multivitamin with minerals daily  - Monitor weight trends.   NUTRITION DIAGNOSIS:   Increased nutrient needs related to acute illness as evidenced by estimated needs.  GOAL:   Patient will meet greater than or equal to 90% of their needs  MONITOR:   PO intake, Supplement acceptance, Diet advancement, Weight trends  REASON FOR ASSESSMENT:   Consult Wound healing  ASSESSMENT:   82 y.o. male PMH significant for poorly controlled insulin-dependent diabetes mellitus, PAD, severe mitral stenosis who presented for nonhealing right foot ulcer.  Patient endorses UBW of 270# and does not feel he has had any significant changes in weight recently. Per EMR, patient has had a 17# or 6% weight loss in the past 3 months, which is not significant for the time frame.   Patient reports he "likes to eat" and would typically eat 3 or more meals a day PTA with great appetite. Shares he likes to eat a lot of fruit and vegetables but doesn't have to have meat at every meal.   Current appetite is good but patient NPO for revision transmetatarsal amputation of right foot today.   Provided patient with wound healing diet education with handout. Discussed protein rich food sources and importance of consuming protein  at all meals along with fresh fruits and vegetables for vitamins/minerals. Reviewed importance of blood sugar control for optimal wound healing. Patient endorsed understanding of information. He is agreeable to try Prosource and Juven once diet advanced to support healing.    Medications reviewed and include: Insulin, Flagyl, Vancomycin  Labs reviewed:  Na 134 HA1C 13.3 Blood Glucose 146-484 x24 hours   NUTRITION - FOCUSED PHYSICAL EXAM:  Flowsheet Row Most Recent Value  Orbital Region No depletion  Upper Arm Region No depletion  Thoracic and Lumbar Region No depletion  Buccal Region No depletion  Temple Region No depletion  Clavicle Bone Region Mild depletion  Clavicle and Acromion Bone Region No depletion  Scapular Bone Region Unable to assess  Dorsal Hand No depletion  Patellar Region No depletion  Anterior Thigh Region No depletion  Posterior Calf Region No depletion  Edema (RD Assessment) None  Hair Reviewed  Eyes Reviewed  Mouth Reviewed  Skin Reviewed  Nails Reviewed       Diet Order:   Diet Order             Diet NPO time specified Except for: Ice Chips, Sips with Meds  Diet effective 0500 tomorrow                   EDUCATION NEEDS:  Education needs have been addressed  Skin:  Skin Assessment: Reviewed RN Assessment  Last BM:  5/13  Height:  Ht Readings from Last 1 Encounters:  03/29/23 6\' 8"  (2.032 m)   Weight:  Wt Readings from Last  1 Encounters:  03/29/23 117.5 kg    BMI:  Body mass index is 28.45 kg/m.  Estimated Nutritional Needs:  Kcal:  2100-2400 kcals Protein:  120-140 grams Fluid:  >/= 2.1L    Shelle Iron RD, LDN For contact information, refer to [facility].

## 2023-03-30 NOTE — Progress Notes (Signed)
ANTICOAGULATION CONSULT NOTE - Initial Consult  Pharmacy Consult for Enoxaparin Indication: atrial flutter  Allergies  Allergen Reactions   Bactrim [Sulfamethoxazole-Trimethoprim] Rash    Patient Measurements: Height: 6\' 8"  (203.2 cm) Weight: 117.5 kg (259 lb) IBW/kg (Calculated) : 96   Vital Signs: Temp: 98 F (36.7 C) (05/15 0907) Temp Source: Oral (05/15 0445) BP: 116/62 (05/15 0907) Pulse Rate: 59 (05/15 0907)  Labs: Recent Labs    03/29/23 1412 03/29/23 1600 03/30/23 0341  HGB 14.4 13.8 13.0  HCT 42.9 41.7 39.5  PLT 220.0 207 191  CREATININE 1.33 1.19 0.93    Estimated Creatinine Clearance: 92.2 mL/min (by C-G formula based on SCr of 0.93 mg/dL).   Medical History: Past Medical History:  Diagnosis Date   Anxiety    Asthma    in past, no inhaler now   Benign neoplasm of colon    Bone infection (HCC)    Cancer (HCC)    bladder cancer   COPD (chronic obstructive pulmonary disease) (HCC)    Depression    Diverticulosis of colon (without mention of hemorrhage)    DJD (degenerative joint disease)    Esophageal reflux    Glaucoma    Hidradenitis    History of BPH    History of gynecomastia    Unilateral   Hyperlipidemia    Irritable bowel syndrome    Memory loss    Mitral stenosis    mild-moderate MS 01/2016   MVP (mitral valve prolapse)    Obesity, unspecified    Pneumonia    Sleep apnea    does not use  cpap or bipap .. no study  ever   Type II or unspecified type diabetes mellitus without mention of complication, not stated as uncontrolled    Unspecified venous (peripheral) insufficiency      Assessment: 89 y/oM with PMH of atrial flutter prescribed Xarelto 20mg  daily. Patient reports currently not taking medication due to patient preference. Admitted for diabetic foot ulcer/concern for osteomyelitis with plan for transmetatarsal amputation of right foot later today (on OR schedule for 1515). Pharmacy consulted for Enoxaparin dosing for atrial  flutter to start tomorrow. SCr 0.93 with CrCl ~ 92 ml/min. CBC WNL.   Goal of Therapy:  Anti-Xa level 0.6-1 units/ml 4hrs after LMWH dose given Monitor platelets by anticoagulation protocol: Yes   Plan:  Per Dr. David Stall and Dr. Annamary Rummage, okay to start full dose Enoxaparin at 1000 on 5/16 Enoxaparin 1mg /kg (120mg ) SQ q12h starting tomorrow at 1000 Monitor CBC at least q72h (ordered daily x 3 for now per MD), renal function, and for s/sx of bleeding    Greer Pickerel, PharmD, BCPS Clinical Pharmacist 03/30/2023,10:12 AM

## 2023-03-30 NOTE — Evaluation (Signed)
Physical Therapy Evaluation [name] MRN: [id number] DOB: [birth year] Today's Date: 03/30/2023  History of Present Illness  82 yo male presents to therapy following transmetatarsal amputation of R LE on 03/30/2023 secondary to osteomyelitis of R first ray and ulcer with necrosis of bone. Pt is currently R LE NWB with CAM boot donned. Pt presented to ED from home on 03/29/2023 with reports of ulcer on R foot enlarging. Pt had elevated blood glucose of 475. Pt tx with IV ABX and MRI of R foot ordered  and podiatry consulted. Pt has PMH including but not limited to bladder Ca, COPD, diverticulitis, GERD, HLD, IBS, memory loss, mitral valve prolapse, OSA, DM II, peripheral insufficiency and multiple digit amputations on R foot.  Clinical Impression   Jason [name] is a 82 y.o. male POD 0 s/p R transmetatarsal amputation. Patient reports mod I with walking stick with mobility at baseline. Patient is now limited by functional impairments (see PT problem list below) and requires min guard for bed mobility and mod A and extensive multimodal cues for transfers. Patient was able to ambulate 2 feet with RW and min A level of assist and unable to maintain RLE NWB status with CAM boot donned. Patient will benefit from continued skilled PT interventions to address impairments and progress towards PLOF. Acute PT will follow to progress mobility and stair training in preparation for safe discharge to next venue.      Recommendations for follow up therapy are one component of a multi-disciplinary discharge planning process, led by the attending physician.  Recommendations may be updated based on patient status, additional functional criteria and insurance authorization.  Follow Up Recommendations Can patient physically be transported by private vehicle: No     Assistance Recommended at Discharge Frequent or constant Supervision/Assistance  Patient can return home with the following   Assistance with cooking/housework;Assist for transportation;Help with stairs or ramp for entrance;A lot of help with walking and/or transfers;A lot of help with bathing/dressing/bathroom;A little help with bathing/dressing/bathroom    Equipment Recommendations None recommended by PT (wife reports RW in home setting)  Recommendations for Other Services       Functional Status Assessment Patient has had a recent decline in their functional status and demonstrates the ability to make significant improvements in function in a reasonable and predictable amount of time.     Precautions / Restrictions Precautions Precautions: Fall;Other (comment) (R CAM boot at all times) Restrictions Weight Bearing Restrictions: Yes RLE Weight Bearing: Non weight bearing      Mobility  Bed Mobility Overal bed mobility: Needs Assistance Bed Mobility: Supine to Sit     Supine to sit: Min guard, HOB elevated     General bed mobility comments: increased time and cues    Transfers Overall transfer level: Needs assistance Equipment used: Rolling walker (2 wheels) Transfers: Sit to/from Stand Sit to Stand: Mod assist, From elevated surface           General transfer comment: extensive cues for R LE NWB CAM boot donned, PTs foot placed under pts for immediate feedback and cues, once in static standing pt is able to perform R hip and knee flexion and maintain RLE NWB. s/p gait trial, PT assisted pt to recliner, pt unable to perform retrograde hop to pattern with ongoing cues for RLE and unable to maintain, absent eccentric control to recliner and pt placing R LE on floor. PT recommends ongoing pt ed on R LE NWB status and at  this time A x 2 for safety wtih transfers at [facility] level    Ambulation/Gait Ambulation/Gait assistance: Min assist Gait Distance (Feet): 3 Feet Assistive device: Rolling walker (2 wheels) Gait Pattern/deviations:  (hop to) Gait velocity: decreased     General Gait Details: PTs  foot under pts as above and regardless of cues Pt continued to attempt R LE WB and gait discontinued.  Stairs            Wheelchair Mobility    Modified Rankin (Stroke Patients Only)       Balance Overall balance assessment: Needs assistance Sitting-balance support: Single extremity supported (L LE support) Sitting balance-Leahy Scale: Fair     Standing balance support: Bilateral upper extremity supported, During functional activity, Reliant on assistive device for balance Standing balance-Leahy Scale: Poor Standing balance comment: min guard to min A to maintain static standing and extensive cues for R LE NWB                             Pertinent Vitals/Pain Pain Assessment Pain Assessment: 0-10 Pain Score: 0-No pain Pain Location: R LE Pain Intervention(s): Limited activity within patient's tolerance, Monitored during session, Premedicated before session    Home Living Family/patient expects to be discharged to:: Private residence Living Arrangements: Spouse/significant other Available Help at Discharge: Family Type of Home: House Home Access: Stairs to enter Entrance Stairs-Rails: None Entrance Stairs-Number of Steps: 1   Home Layout: One level Home Equipment: Agricultural consultant (2 wheels) (walking stick)      Prior Function Prior Level of Function : Independent/Modified Independent             Mobility Comments: Mod I with ADLs, self care taks, IADLs, driving       Hand Dominance        Extremity/Trunk Assessment        Lower Extremity Assessment Lower Extremity Assessment: RLE deficits/detail RLE Deficits / Details: ankle NT, remaining WFL RLE Sensation: decreased light touch;decreased proprioception    Cervical / Trunk Assessment Cervical / Trunk Assessment: Normal  Communication   Communication: No difficulties  Cognition Arousal/Alertness: Awake/alert Behavior During Therapy: WFL for tasks assessed/performed Overall  Cognitive Status: Within Functional Limits for tasks assessed                                          General Comments      Exercises     Assessment/Plan    PT Assessment Patient needs continued PT services  PT Problem List Decreased strength;Decreased activity tolerance;Decreased balance;Decreased mobility;Decreased coordination;Decreased knowledge of use of DME       PT Treatment Interventions DME instruction;Gait training;Stair training;Functional mobility training;Therapeutic activities;Therapeutic exercise;Balance training;Neuromuscular re-education;Patient/family education;Modalities    PT Goals (Current goals can be found in the Care Plan section)  Acute Rehab PT Goals Patient Stated Goal: breakdance PT Goal Formulation: With patient Time For Goal Achievement: 04/13/23 Potential to Achieve Goals: Good    Frequency Min 1X/week     Co-evaluation               AM-PAC PT "6 Clicks" Mobility  Outcome Measure Help needed turning from your back to your side while in a flat bed without using bedrails?: A Little Help needed moving from lying on your back to sitting on the side of a flat bed without using bedrails?:  A Little Help needed moving to and from a bed to a chair (including a wheelchair)?: A Lot Help needed standing up from a chair using your arms (e.g., wheelchair or bedside chair)?: A Lot Help needed to walk in hospital room?: A Lot Help needed climbing 3-5 steps with a railing? : Total 6 Click Score: 13    End of Session Equipment Utilized During Treatment: Gait belt;Other (comment) (R CAM boot) Activity Tolerance: Patient tolerated treatment well;No increased pain Patient left: in chair;with call bell/phone within reach Nurse Communication: Mobility status;Precautions;Weight bearing status PT Visit Diagnosis: Unsteadiness on feet (R26.81);Other abnormalities of gait and mobility (R26.89);Muscle weakness (generalized) (M62.81);Difficulty  in walking, not elsewhere classified (R26.2)    Time: 1730-1801 PT Time Calculation (min) (ACUTE ONLY): 31 min   Charges:   PT Evaluation $PT Eval Low Complexity: 1 Low PT Treatments $Therapeutic Activity: 8-22 mins        Johnny Bridge, PT Acute Rehab   Jacqualyn Posey 03/30/2023, 7:00 PM

## 2023-03-30 NOTE — Transfer of Care (Signed)
Immediate Anesthesia Transfer of Care Note  Patient: Jason [name]  Procedure(s) Performed: TRANSMETATARSAL AMPUTATION, RIGHT FOOT (Right: Foot)  Patient Location: PACU  Anesthesia Type:Regional  Level of Consciousness: awake  Airway & Oxygen Therapy: Patient Spontanous Breathing and Patient connected to face mask oxygen  Post-op Assessment: Report given to RN and Post -op Vital signs reviewed and stable  Post vital signs: Reviewed and stable  Last Vitals:  Vitals Value Taken Time  BP 94/60 03/30/23 1525  Temp    Pulse 56 03/30/23 1527  Resp 18 03/30/23 1527  SpO2 100 % 03/30/23 1527  Vitals shown include unvalidated device data.  Last Pain:  Vitals:   03/30/23 1420  TempSrc:   PainSc: 0-No pain      Patients Stated Pain Goal: 0 (03/30/23 0337)  Complications: No notable events documented.

## 2023-03-31 ENCOUNTER — Encounter (HOSPITAL_COMMUNITY): Payer: Self-pay | Admitting: Podiatry

## 2023-03-31 DIAGNOSIS — L97519 Non-pressure chronic ulcer of other part of right foot with unspecified severity: Secondary | ICD-10-CM | POA: Diagnosis not present

## 2023-03-31 DIAGNOSIS — G4733 Obstructive sleep apnea (adult) (pediatric): Secondary | ICD-10-CM | POA: Diagnosis not present

## 2023-03-31 DIAGNOSIS — L089 Local infection of the skin and subcutaneous tissue, unspecified: Secondary | ICD-10-CM | POA: Diagnosis not present

## 2023-03-31 DIAGNOSIS — R739 Hyperglycemia, unspecified: Secondary | ICD-10-CM | POA: Diagnosis not present

## 2023-03-31 LAB — CBC
HCT: 40.1 % (ref 39.0–52.0)
Hemoglobin: 13.3 g/dL (ref 13.0–17.0)
MCH: 30.2 pg (ref 26.0–34.0)
MCHC: 33.2 g/dL (ref 30.0–36.0)
MCV: 90.9 fL (ref 80.0–100.0)
Platelets: 206 10*3/uL (ref 150–400)
RBC: 4.41 MIL/uL (ref 4.22–5.81)
RDW: 11.5 % (ref 11.5–15.5)
WBC: 8.4 10*3/uL (ref 4.0–10.5)
nRBC: 0 % (ref 0.0–0.2)

## 2023-03-31 LAB — BASIC METABOLIC PANEL
Anion gap: 8 (ref 5–15)
BUN: 18 mg/dL (ref 8–23)
CO2: 22 mmol/L (ref 22–32)
Calcium: 8.4 mg/dL — ABNORMAL LOW (ref 8.9–10.3)
Chloride: 97 mmol/L — ABNORMAL LOW (ref 98–111)
Creatinine, Ser: 0.94 mg/dL (ref 0.61–1.24)
GFR, Estimated: >60 mL/min (ref >60)
Glucose, Bld: 288 mg/dL — ABNORMAL HIGH (ref 70–99)
Potassium: 4.1 mmol/L (ref 3.5–5.1)
Sodium: 127 mmol/L — ABNORMAL LOW (ref 135–145)

## 2023-03-31 LAB — GLUCOSE, CAPILLARY
Glucose-Capillary: 225 mg/dL — ABNORMAL HIGH (ref 70–99)
Glucose-Capillary: 235 mg/dL — ABNORMAL HIGH (ref 70–99)
Glucose-Capillary: 253 mg/dL — ABNORMAL HIGH (ref 70–99)
Glucose-Capillary: 292 mg/dL — ABNORMAL HIGH (ref 70–99)
Glucose-Capillary: 332 mg/dL — ABNORMAL HIGH (ref 70–99)
Glucose-Capillary: 351 mg/dL — ABNORMAL HIGH (ref 70–99)

## 2023-03-31 MED ORDER — INSULIN ASPART 100 UNIT/ML IJ SOLN
4.0000 [IU] | Freq: Three times a day (TID) | INTRAMUSCULAR | Status: DC
  Administered 2023-03-31 (×3): 4 [IU] via SUBCUTANEOUS

## 2023-03-31 MED ORDER — ADULT MULTIVITAMIN W/MINERALS CH
1.0000 | ORAL_TABLET | Freq: Every day | ORAL | Status: DC
  Administered 2023-03-31 – 2023-04-02 (×3): 1 via ORAL
  Filled 2023-03-31 (×3): qty 1

## 2023-03-31 MED ORDER — INSULIN ASPART 100 UNIT/ML IJ SOLN
0.0000 [IU] | Freq: Every day | INTRAMUSCULAR | Status: DC
  Administered 2023-03-31: 2 [IU] via SUBCUTANEOUS

## 2023-03-31 MED ORDER — JUVEN PO PACK
1.0000 | PACK | Freq: Two times a day (BID) | ORAL | Status: DC
  Administered 2023-03-31 – 2023-04-01 (×4): 1 via ORAL
  Filled 2023-03-31 (×6): qty 1

## 2023-03-31 MED ORDER — INSULIN GLARGINE-YFGN 100 UNIT/ML ~~LOC~~ SOLN
15.0000 [IU] | Freq: Two times a day (BID) | SUBCUTANEOUS | Status: DC
  Administered 2023-03-31: 15 [IU] via SUBCUTANEOUS
  Filled 2023-03-31 (×2): qty 0.15

## 2023-03-31 MED ORDER — INSULIN ASPART 100 UNIT/ML IJ SOLN
8.0000 [IU] | Freq: Three times a day (TID) | INTRAMUSCULAR | Status: DC
  Administered 2023-04-01 – 2023-04-02 (×5): 8 [IU] via SUBCUTANEOUS

## 2023-03-31 MED ORDER — PROSOURCE PLUS PO LIQD
30.0000 mL | Freq: Three times a day (TID) | ORAL | Status: DC
  Administered 2023-03-31 – 2023-04-02 (×7): 30 mL via ORAL
  Filled 2023-03-31 (×7): qty 30

## 2023-03-31 MED ORDER — INSULIN ASPART 100 UNIT/ML IJ SOLN
0.0000 [IU] | Freq: Three times a day (TID) | INTRAMUSCULAR | Status: DC
  Administered 2023-03-31: 5 [IU] via SUBCUTANEOUS
  Administered 2023-03-31: 15 [IU] via SUBCUTANEOUS
  Administered 2023-03-31: 8 [IU] via SUBCUTANEOUS
  Administered 2023-04-01 – 2023-04-02 (×3): 2 [IU] via SUBCUTANEOUS

## 2023-03-31 MED ORDER — INSULIN GLARGINE-YFGN 100 UNIT/ML ~~LOC~~ SOLN
25.0000 [IU] | Freq: Two times a day (BID) | SUBCUTANEOUS | Status: DC
  Administered 2023-03-31 – 2023-04-02 (×4): 25 [IU] via SUBCUTANEOUS
  Filled 2023-03-31 (×5): qty 0.25

## 2023-03-31 MED ORDER — POLYETHYLENE GLYCOL 3350 17 G PO PACK
17.0000 g | PACK | Freq: Two times a day (BID) | ORAL | Status: AC
  Administered 2023-03-31 – 2023-04-01 (×3): 17 g via ORAL
  Filled 2023-03-31 (×3): qty 1

## 2023-03-31 NOTE — Progress Notes (Signed)
Physical Therapy Treatment [name] MRN: [id number] DOB: 1941-06-28 Today's Date: 03/31/2023   History of Present Illness 82 yo male presents to therapy following transmetatarsal amputation of R LE on 03/30/2023 secondary to osteomyelitis of R first ray and ulcer with necrosis of bone. Pt is currently R LE NWB with CAM boot donned. Pt presented to ED from home on 03/29/2023 with reports of ulcer on R foot enlarging. Pt had elevated blood glucose of 475. Pt tx with IV ABX and MRI of R foot ordered  and podiatry consulted. Pt has PMH including but not limited to bladder Ca, COPD, diverticulitis, GERD, HLD, IBS, memory loss, mitral valve prolapse, OSA, DM II, peripheral insufficiency and multiple digit amputations on R foot.    PT Comments    Pt performed sit to stand x 2 with Stedy lift equipment from elevated bed. Pt mostly able to maintain NWB status RLE, however did place RLE on foot plate at times. Performed RLE strengthening exercises in sitting.    Recommendations for follow up therapy are one component of a multi-disciplinary discharge planning process, led by the attending physician.  Recommendations may be updated based on patient status, additional functional criteria and insurance authorization.  Follow Up Recommendations  Can patient physically be transported by private vehicle: No    Assistance Recommended at Discharge Frequent or constant Supervision/Assistance  Patient can return home with the following Assistance with cooking/housework;Assist for transportation;Help with stairs or ramp for entrance;A lot of help with walking and/or transfers;A lot of help with bathing/dressing/bathroom;A little help with bathing/dressing/bathroom   Equipment Recommendations  None recommended by PT (wife reports RW in home setting)    Recommendations for Other Services       Precautions / Restrictions Precautions Precautions: Fall (R CAM boot at all times) Required  Braces or Orthoses: Other Brace Other Brace: R CAM boot at all times Restrictions Weight Bearing Restrictions: Yes RLE Weight Bearing: Non weight bearing     Mobility  Bed Mobility Overal bed mobility: Modified Independent Bed Mobility: Supine to Sit     Supine to sit: Modified independent (Device/Increase time), HOB elevated     General bed mobility comments: used bedrail, HOB up    Transfers Overall transfer level: Needs assistance   Transfers: Sit to/from Stand, Bed to chair/wheelchair/BSC Sit to Stand: Min assist, From elevated surface           General transfer comment: sit to stand from elevated bed pulling up on Stedy with VCs for NWB RLE, pivot to recliner using Stedy, pt able to hold R foot up off of foot plate for brief periods but did admit to putting foot down at times. Reviewed NWB status for RLE. Transfer via Lift Equipment: Stedy  Ambulation/Gait                   Stairs             Wheelchair Mobility    Modified Rankin (Stroke Patients Only)       Balance Overall balance assessment: Needs assistance Sitting-balance support: Single extremity supported (L LE support) Sitting balance-Leahy Scale: Fair     Standing balance support: Bilateral upper extremity supported, During functional activity, Reliant on assistive device for balance Standing balance-Leahy Scale: Poor Standing balance comment: min guard to min A to maintain static standing and extensive cues for R LE NWB  Cognition Arousal/Alertness: Awake/alert Behavior During Therapy: WFL for tasks assessed/performed Overall Cognitive Status: Within Functional Limits for tasks assessed                                          Exercises General Exercises - Lower Extremity Long Arc Quad: AROM, Right, 15 reps, Seated Hip Flexion/Marching: AROM, Right, 10 reps, Seated    General Comments        Pertinent Vitals/Pain  Pain Assessment Pain Score: 0-No pain Pain Location: R LE    Home Living                          Prior Function            PT Goals (current goals can now be found in the care plan section) Acute Rehab PT Goals Patient Stated Goal: breakdance PT Goal Formulation: With patient Time For Goal Achievement: 04/13/23 Potential to Achieve Goals: Good Progress towards PT goals: Progressing toward goals    Frequency    Min 1X/week      PT Plan      Co-evaluation              AM-PAC PT "6 Clicks" Mobility   Outcome Measure  Help needed turning from your back to your side while in a flat bed without using bedrails?: A Little Help needed moving from lying on your back to sitting on the side of a flat bed without using bedrails?: A Little Help needed moving to and from a bed to a chair (including a wheelchair)?: A Lot Help needed standing up from a chair using your arms (e.g., wheelchair or bedside chair)?: A Lot Help needed to walk in hospital room?: A Lot Help needed climbing 3-5 steps with a railing? : Total 6 Click Score: 13    End of Session Equipment Utilized During Treatment: Gait belt;Other (comment) (R CAM boot) Activity Tolerance: Patient tolerated treatment well;No increased pain Patient left: in chair;with call bell/phone within reach Nurse Communication: Mobility status;Precautions;Weight bearing status;Need for lift equipment PT Visit Diagnosis: Unsteadiness on feet (R26.81);Other abnormalities of gait and mobility (R26.89);Muscle weakness (generalized) (M62.81);Difficulty in walking, not elsewhere classified (R26.2)     Time: 1610-9604 PT Time Calculation (min) (ACUTE ONLY): 17 min  Charges:  $Therapeutic Activity: 8-22 mins                    Ralene Bathe Kistler PT 03/31/2023  Acute Rehabilitation Services  Office [phone]

## 2023-03-31 NOTE — Plan of Care (Signed)
  Problem: Pain Managment: Goal: General experience of comfort will improve Outcome: Progressing   Problem: Safety: Goal: Ability to remain free from injury will improve Outcome: Progressing   

## 2023-03-31 NOTE — Plan of Care (Signed)
  Problem: Education: Goal: Knowledge of General Education information will improve Description: Including pain rating scale, medication(s)/side effects and non-pharmacologic comfort measures Outcome: Progressing   Problem: Pain Managment: Goal: General experience of comfort will improve Outcome: Progressing   Problem: Safety: Goal: Ability to remain free from injury will improve Outcome: Progressing   

## 2023-03-31 NOTE — TOC Initial Note (Signed)
Transition of Care [facility]) - Initial/Assessment Note   Patient Details  Name: Jason [name] MRN: [id number] Date of Birth: 08-Jun-1941  Transition [facility]) CM/SW Contact:    [name], LCSW Phone Number: 03/31/2023, 10:38 AM  Clinical Narrative: PT evaluation recommended SNF and patient is agreeable. FL2 done; PASRR received. Initial referral faxed out. TOC awaiting bed offers.  Expected Discharge Plan: Skilled Nursing Facility Barriers to Discharge: Insurance Authorization, SNF Pending bed offer  Patient Goals and CMS Choice Patient states their goals for this hospitalization and ongoing recovery are:: Go to rehab CMS Medicare.gov Compare Post Acute Care list provided to:: Patient Choice offered to / list presented to : Patient  Expected Discharge Plan and Services In-house Referral: Clinical Social Work Living arrangements for the past 2 months: Skilled Nursing Facility           DME Arranged: N/A DME Agency: NA  Prior Living Arrangements/Services Living arrangements for the past 2 months: Skilled Nursing Facility Patient language and need for interpreter reviewed:: Yes Do you feel safe going back to the place where you live?: Yes      Need for Family Participation in Patient Care: No (Comment) Care giver support system in place?: Yes (comment) Criminal Activity/Legal Involvement Pertinent to Current Situation/Hospitalization: No - Comment as needed  Activities of Daily Living Home Assistive Devices/Equipment: Cane (specify quad or straight), Grab bars in shower, Grab bars around toilet, Shower chair with back, Hand-held shower hose ADL Screening (condition at time of admission) Patient's cognitive ability adequate to safely complete daily activities?: Yes Is the patient deaf or have difficulty hearing?: No Does the patient have difficulty seeing, even when wearing glasses/contacts?: No Does the patient have difficulty concentrating, remembering, or making decisions?:  Yes Patient able to express need for assistance with ADLs?: Yes Does the patient have difficulty dressing or bathing?: No Independently performs ADLs?: Yes (appropriate for developmental age) Does the patient have difficulty walking or climbing stairs?: No Weakness of Legs: None Weakness of Arms/Hands: None  Permission Sought/Granted Permission sought to share information with : Facility Industrial/product designer granted to share information with : Yes, Verbal Permission Granted Permission granted to share info w AGENCY: SNFs  Emotional Assessment Appearance:: Appears stated age Attitude/Demeanor/Rapport: Engaged Affect (typically observed): Accepting Orientation: : Oriented to Self, Oriented to Place, Oriented to  Time, Oriented to Situation Alcohol / Substance Use: Not Applicable Psych Involvement: No (comment)  Admission diagnosis:  Hyperglycemia [R73.9] Right foot infection [L08.9] Ulcer of right foot, unspecified ulcer stage (HCC) [L97.519] Patient Active Problem List   Diagnosis Date Noted   Subacute osteomyelitis of right foot (HCC) 03/30/2023   Diabetic ulcer of right midfoot associated with type 2 diabetes mellitus, with muscle involvement without evidence of necrosis (HCC) 03/29/2023   Gangrene of right foot (HCC) 03/29/2023   Right foot infection 03/29/2023   Encounter for general adult medical examination with abnormal findings 03/19/2022   Insulin-requiring or dependent type II diabetes mellitus (HCC) 03/16/2022   Atrial flutter (HCC) 08/02/2021   Nonrheumatic mitral valve stenosis 01/27/2021   Atherosclerosis of aorta (HCC) 09/30/2020   Abnormal electrocardiogram (ECG) (EKG) 09/30/2020   Thiamine deficiency 07/04/2020   Ulcer of right foot with necrosis of bone (HCC)    Malignant neoplasm of urinary bladder (HCC) 07/11/2018   Chronic idiopathic constipation 06/12/2018   Complex sleep apnea syndrome 01/12/2016   OSA (obstructive sleep apnea) 12/18/2015    Benign prostatic hyperplasia 02/18/2014   PAD (peripheral artery disease) (HCC)  02/18/2014   DEGENERATIVE JOINT DISEASE 02/20/2008   Diabetes mellitus type 2 with atherosclerosis of arteries of extremities (HCC) 01/26/2008   Hyperlipidemia with target LDL less than 70 01/26/2008   Mitral valve disorder 01/26/2008   GERD 01/26/2008   Irritable bowel syndrome 01/26/2008   PCP:  [name], MD Pharmacy:   [facility] [phone] - [address], [place] - [address] AT [address] [place] [address] Phone: [phone] Fax: [phone]  Social Determinants of Health (SDOH) Social History: SDOH Screenings   Food Insecurity: No Food Insecurity (03/29/2023)  Housing: Low Risk  (03/29/2023)  Transportation Needs: No Transportation Needs (03/29/2023)  Utilities: Not At Risk (03/29/2023)  Alcohol Screen: Low Risk  (06/01/2022)  Depression (PHQ2-9): Low Risk  (06/01/2022)  Financial Resource Strain: Low Risk  (06/01/2022)  Physical Activity: Sufficiently Active (06/01/2022)  Social Connections: Socially Integrated (06/01/2022)  Stress: Stress Concern Present (06/01/2022)  Tobacco Use: Medium Risk (03/31/2023)   SDOH Interventions:    Readmission Risk Interventions     No data to display

## 2023-03-31 NOTE — Plan of Care (Signed)
  Problem: Education: Goal: Ability to describe self-care measures that may prevent or decrease complications (Diabetes Survival Skills Education) will improve Outcome: Progressing   Problem: Health Behavior/Discharge Planning: Goal: Ability to manage health-related needs will improve Outcome: Progressing   Problem: Metabolic: Goal: Ability to maintain appropriate glucose levels will improve Outcome: Progressing   

## 2023-03-31 NOTE — Progress Notes (Signed)
[facility] PROGRESS NOTE    Progress Note  Jason [name]  ZOX:[id number] DOB: 04-04-1941 DOA: 03/29/2023 PCP: [name], MD     Brief Narrative:   Jason [name] is an 82 y.o. male past medical history significant for poorly controlled insulin-dependent diabetes mellitus, peripheral artery tibial disease, atrial flutter on Xarelto obstructive sleep apnea CPAP intolerant, severe mitral stenosis who has missed his follow-up appointments and is noncompliant with his medication.  Comes into the emergency room for nonhealing right foot ulcer. MRI of the right foot positive for osteomyelitis of the first metatarsal head no abscess with edema of the plantar muscles and tendon  Assessment/Plan:   Osteomyelitis diabetic foot ulcer/Right foot infection: Septic on admission. Podiatrist was consulted and he status post transmetatarsal amputation of the right foot Continue IV Vanc., Rocephin and Flagyl. ABI's showed resting ankle-brachial index noncompressible of the right lower extremity. Podiatrist to determine when to start anticoagulation. PT has been consulted will need skilled nursing facility.  Atrial flutter: Now in sinus rhythm, hold Xarelto although he has not taken it in several days. Has a history of noncompliance with his medication.  Insulin-dependent diabetes mellitus type 2: A1c of 13, blood glucose still elevated, increase long-acting insulin and change sliding scale to moderate with meal coverage.  PAD (peripheral artery disease) (HCC): Continue statins.  History of severe mitral stenosis: He relates he has been followed by cardiology by this, he relates he will not want surgery for his mitral stenosis. Try to call to explain his wishes I was unsuccessful. I did talk to him about palliative care and hospice he would like to proceed with meeting with palliative care.  DVT prophylaxis: none Family Communication:none Status is: Inpatient Remains inpatient  appropriate because: acute osteomyelitis    Code Status:     Code Status Orders  (From admission, onward)           Start     Ordered   03/29/23 1729  Full code  Continuous       Question:  By:  Answer:  Consent: discussion documented in EHR   03/29/23 1729           Code Status History     Date Active Date Inactive Code Status Order ID Comments User Context   06/13/2018 2202 06/15/2018 1712 Full Code [id number]  Colon Branch, MD Inpatient   11/16/2015 1310 11/19/2015 1429 Full Code [id number]  Osvaldo Shipper, MD Inpatient   11/05/2015 1643 11/07/2015 1356 Full Code [id number]  Rhetta Mura, MD Inpatient         IV Access:   Peripheral IV   Procedures and diagnostic studies:   DG Foot 2 Views Right  Result Date: 03/30/2023 CLINICAL DATA:  252351 Post-operative state 252351 EXAM: RIGHT FOOT - 2 VIEW COMPARISON:  None Available. FINDINGS: Postsurgical changes of transmetatarsal amputation. Surgical margins are smooth. Expected soft tissue swelling and gas. Soft tissue calcifications of the lower leg, unchanged. IMPRESSION: Postsurgical changes of transmetatarsal amputation. Electronically Signed   By: Caprice Renshaw M.D.   On: 03/30/2023 16:12   MR FOOT RIGHT W WO CONTRAST  Result Date: 03/29/2023 CLINICAL DATA:  Concern for right foot osteomyelitis. Soft tissue infection. EXAM: MRI OF THE RIGHT FOREFOOT WITHOUT AND WITH CONTRAST TECHNIQUE: Multiplanar, multisequence MR imaging of the right forefoot was performed before and after the administration of intravenous contrast. CONTRAST:  10mL GADAVIST GADOBUTROL 1 MMOL/ML IV SOLN COMPARISON:  Radiograph dated Mar 29, 2023 FINDINGS: Bones/Joint/Cartilage Postsurgical changes  with prior amputation of the right foot through the metatarsophalangeal joints of the first and fifth digit and through metatarsal heads of the second through fourth digits. There is mild edema about the plantar aspect of the first metatarsal  head adjacent to the deep skin wound about the plantar aspect of the foot concerning for osteomyelitis. Ligaments Lisfranc ligament is intact. Muscles and Tendons Edema of the plantar muscles and tendons suggesting diabetic myopathy/myositis. No fluid collection or abscess. Soft tissues Deep skin wound about the plantar aspect of the first metatarsal head. Mild generalized subcutaneous soft tissue edema about the dorsum of the foot. IMPRESSION: 1. Deep skin wound about the plantar aspect of the first metatarsal head with mild marrow edema about the plantar aspect of the first metatarsal head concerning for osteomyelitis. 2. No fluid collection or abscess. 3. Postsurgical changes with prior amputation of the right foot through the metatarsophalangeal joints of the first and fifth digit and through metatarsal heads of the second through fourth digits. 4. Edema of the plantar muscles and tendons suggesting diabetic myopathy/myositis. Electronically Signed   By: Larose Hires D.O.   On: 03/29/2023 20:56   VAS [place] ABI WITH/WO TBI  Result Date: 03/29/2023  LOWER EXTREMITY DOPPLER STUDY Patient Name:  Jason [name]  Date of Exam:   03/29/2023 Medical Rec #: [id number]        Accession #:    [id number] Date of Birth: 1941-07-02         Patient Gender: M Patient Age:   48 years Exam Location:  [facility] Procedure:      VAS [place] ABI WITH/WO TBI Referring Phys: DAVID YAO --------------------------------------------------------------------------------  Indications: Ulceration. right transmetatarsal amputation High Risk Factors: Hyperlipidemia, Diabetes.  Comparison Study: 11/18/2022 - Right: Resting right ankle-brachial index is within                   normal range.                    Left:                    Resting left ankle-brachial index is elevated, suggestive of                   medial                   arterial calcification. Absent left great toe PPG waveform. Performing Technologist: Olen Cordial RVT   Examination Guidelines: A complete evaluation includes at minimum, Doppler waveform signals and systolic blood pressure reading at the level of bilateral brachial, anterior tibial, and posterior tibial arteries, when vessel segments are accessible. Bilateral testing is considered an integral part of a complete examination. Photoelectric Plethysmograph (PPG) waveforms and toe systolic pressure readings are included as required and additional duplex testing as needed. Limited examinations for reoccurring indications may be performed as noted.  ABI Findings: +---------+------------------+-----+-----------+----------+ Right    Rt Pressure (mmHg)IndexWaveform   Comment    +---------+------------------+-----+-----------+----------+ Brachial 108                    triphasic             +---------+------------------+-----+-----------+----------+ PTA      169               1.46 multiphasic           +---------+------------------+-----+-----------+----------+ DP       125  1.08 multiphasic           +---------+------------------+-----+-----------+----------+ Great Toe                                  Amputation +---------+------------------+-----+-----------+----------+ +---------+------------------+-----+-----------+-------+ Left     Lt Pressure (mmHg)IndexWaveform   Comment +---------+------------------+-----+-----------+-------+ Brachial 116                    triphasic          +---------+------------------+-----+-----------+-------+ PTA      175               1.51 multiphasic        +---------+------------------+-----+-----------+-------+ DP       189               1.63 multiphasic        +---------+------------------+-----+-----------+-------+ Great Toe51                0.44                    +---------+------------------+-----+-----------+-------+ +-------+-----------+-----------+------------+------------+ ABI/TBIToday's ABIToday's  TBIPrevious ABIPrevious TBI +-------+-----------+-----------+------------+------------+ Right  1.46       Amputation 1.39        Amputation   +-------+-----------+-----------+------------+------------+ Left   1.63       0.44       1.53        1.12         +-------+-----------+-----------+------------+------------+  Summary: Right: Resting right ankle-brachial index indicates noncompressible right lower extremity arteries. Unable to obtain TBI due to great toe amputation. Left: Resting left ankle-brachial index indicates noncompressible left lower extremity arteries. The left toe-brachial index is abnormal. *See table(s) above for measurements and observations.  Electronically signed by Waverly Ferrari MD on 03/29/2023 at 6:11:45 PM.    Final    DG Foot Complete Right  Result Date: 03/29/2023 CLINICAL DATA:  Right foot ulcer.  Evaluate for osteomyelitis. EXAM: RIGHT FOOT COMPLETE - 3+ VIEW COMPARISON:  Right foot radiographs 03/29/2023, earlier same day. Right foot radiographs 03/31/2021 FINDINGS: Redemonstration of amputation of the first through fifth toe phalanges and the distal aspect of the second through fourth metatarsals. Mild first through fifth tarsometatarsal joint space narrowing. Moderate to high-grade dorsal talonavicular joint space narrowing and osteophytosis. Mild-to-moderate dorsal tarsometatarsal degenerative osteophytosis on lateral view. Moderate to large plantar and mild to moderate posterior calcaneal heel spurs. Diffuse soft tissue edema. Soft tissue air extending from the distal medial forefoot to the distal medial great toe metatarsal head. No cortical erosion is seen. IMPRESSION: 1. No significant change from radiographs performed earlier today. 2. Status post amputation of the first through fifth toe phalanges and distal aspect of the second through fourth metatarsals. 3. Soft tissue air at the distal medial aspect of the forefoot extending to the distal medial aspect  of the great toe metatarsal head. No cortical erosion to indicate radiographic evidence of osteomyelitis. Electronically Signed   By: Neita Garnet M.D.   On: 03/29/2023 16:58   DG Foot Complete Right  Result Date: 03/29/2023 CLINICAL DATA:  Right foot pain for 3 weeks.  No injury. EXAM: RIGHT FOOT COMPLETE - 3+ VIEW COMPARISON:  03/31/2021. FINDINGS: Previous amputation of all toes, which was present on the prior study. No fracture. No bone lesion. No bone resorption to suggest osteomyelitis. Joints are normally spaced and aligned. Mild dorsal spurring at the cuneiform metatarsal articulation on the lateral  view and talar navicular articulation. Moderate-sized plantar and dorsal calcaneal spurs. Diffuse soft tissue edema. There is soft tissue air extending from the distal medial foot to the medial aspect of the first metatarsal head. IMPRESSION: 1. No fracture or dislocation. 2. No evidence of osteomyelitis. 3. Soft tissue air extending anteriorly from the first metatarsal head. Diffuse soft tissue swelling. Electronically Signed   By: Amie Portland M.D.   On: 03/29/2023 14:45     Medical Consultants:   None.   Subjective:    Jason [name] no complaints, pain is controlled.  Objective:    Vitals:   03/30/23 1817 03/30/23 2215 03/31/23 0021 03/31/23 0422  BP: 129/62 125/66 128/68 117/69  Pulse: 68 70 66 64  Resp: 18 17 16 16   Temp: 97.8 F (36.6 C) 97.9 F (36.6 C) 97.9 F (36.6 C) 97.7 F (36.5 C)  TempSrc:  Oral Oral Oral  SpO2: 97% 99% 98% 96%  Weight:      Height:       SpO2: 96 % O2 Flow Rate (L/min): 8 L/min   Intake/Output Summary (Last 24 hours) at 03/31/2023 0901 Last data filed at 03/31/2023 0600 Gross per 24 hour  Intake 1100 ml  Output 1675 ml  Net -575 ml    Filed Weights   03/29/23 2028 03/30/23 1403  Weight: 117.5 kg 117.5 kg    Exam: General exam: In no acute distress. Respiratory system: Good air movement and clear to  auscultation. Cardiovascular system: S1 & S2 heard, RRR. No JVD. Gastrointestinal system: Abdomen is nondistended, soft and nontender.  Extremities: No pedal edema. Skin: No rashes, lesions or ulcers Psychiatry: Judgement and insight appear normal. Mood & affect appropriate.   Data Reviewed:    Labs: Basic Metabolic Panel: Recent Labs  Lab 03/29/23 1412 03/29/23 1600 03/30/23 0341 03/31/23 0344  NA 131* 130* 134* 127*  K 4.4 4.3 3.6 4.1  CL 94* 96* 100 97*  CO2 28 26 26 22   GLUCOSE 490* 475* 150* 288*  BUN 14 15 13 18   CREATININE 1.33 1.19 0.93 0.94  CALCIUM 9.5 8.9 8.7* 8.4*    GFR Estimated Creatinine Clearance: 91.2 mL/min (by C-G formula based on SCr of 0.94 mg/dL). Liver Function Tests: Recent Labs  Lab 03/29/23 1412 03/29/23 1600  AST 12 13*  ALT 12 15  ALKPHOS 61 57  BILITOT 0.9 1.2  PROT 7.7 7.9  ALBUMIN 3.9 3.6    No results for input(s): "LIPASE", "AMYLASE" in the last 168 hours. No results for input(s): "AMMONIA" in the last 168 hours. Coagulation profile No results for input(s): "INR", "PROTIME" in the last 168 hours. COVID-19 Labs  Recent Labs    03/30/23 0341  CRP <0.5     Lab Results  Component Value Date   SARSCOV2NAA NOT DETECTED 07/21/2019   SARSCOV2NAA NEGATIVE 05/14/2019   SARSCOV2NAA NEGATIVE 05/07/2019    CBC: Recent Labs  Lab 03/29/23 1412 03/29/23 1600 03/30/23 0341 03/31/23 0344  WBC 5.3 4.9 4.4 8.4  NEUTROABS 3.7  --   --   --   HGB 14.4 13.8 13.0 13.3  HCT 42.9 41.7 39.5 40.1  MCV 89.7 90.1 90.2 90.9  PLT 220.0 207 191 206    Cardiac Enzymes: No results for input(s): "CKTOTAL", "CKMB", "CKMBINDEX", "TROPONINI" in the last 168 hours. BNP (last 3 results) No results for input(s): "PROBNP" in the last 8760 hours. CBG: Recent Labs  Lab 03/30/23 1531 03/30/23 2016 03/31/23 0021 03/31/23 0426 03/31/23 0743  GLUCAP 146*  329* 332* 292* 253*    D-Dimer: No results for input(s): "DDIMER" in the last 72  hours. Hgb A1c: Recent Labs    03/29/23 1412  HGBA1C 13.3*    Lipid Profile: Recent Labs    03/29/23 1412  CHOL 188  HDL 50.40  LDLCALC 114*  TRIG 118.0  CHOLHDL 4    Thyroid function studies: Recent Labs    03/29/23 1412  TSH 1.47    Anemia work up: No results for input(s): "VITAMINB12", "FOLATE", "FERRITIN", "TIBC", "IRON", "RETICCTPCT" in the last 72 hours. Sepsis Labs: Recent Labs  Lab 03/29/23 1412 03/29/23 1600 03/30/23 0341 03/31/23 0344  WBC 5.3 4.9 4.4 8.4    Microbiology Recent Results (from the past 240 hour(s))  Surgical PCR screen     Status: Abnormal   Collection Time: 03/29/23 11:58 PM   Specimen: Nasal Mucosa; Nasal Swab  Result Value Ref Range Status   MRSA, PCR POSITIVE (A) NEGATIVE Final   Staphylococcus aureus POSITIVE (A) NEGATIVE Final    Comment: (NOTE) The Xpert SA Assay (FDA approved for NASAL specimens in patients 5 years of age and older), is one component of a comprehensive surveillance program. It is not intended to diagnose infection nor to guide or monitor treatment. Performed at [facility], [address], [place], [place] [address]   Aerobic/Anaerobic Culture w Gram Stain (surgical/deep wound)     Status: None (Preliminary result)   Collection Time: 03/30/23  2:53 PM   Specimen: PATH Bone resection; Tissue  Result Value Ref Range Status   Specimen Description   Final    BONE RIGHT FOOT Performed at [facility], [address] [place], [place] [address]    Special Requests   Final    NONE Performed at [facility], [address] [place], [place] [address]    Gram Stain NO WBC SEEN NO ORGANISMS SEEN   Final   Culture   Final    NO GROWTH < 24 HOURS Performed at [facility], [address], [place], [place] [address]    Report Status PENDING  Incomplete     Medications:    (feeding supplement) PROSource Plus  30 mL Oral TID BM    enoxaparin (LOVENOX) injection  120 mg Subcutaneous Q12H   insulin aspart  0-6 Units Subcutaneous Q4H   insulin glargine-yfgn  10 Units Subcutaneous BID   metroNIDAZOLE  500 mg Oral Q12H   multivitamin with minerals  1 tablet Oral Daily   mupirocin ointment  1 Application Nasal BID   nutrition supplement (JUVEN)  1 packet Oral BID BM   simvastatin  40 mg Oral q1800   Continuous Infusions:  sodium chloride     cefTRIAXone (ROCEPHIN)  IV 2 g (03/31/23 0024)   vancomycin 1,250 mg (03/30/23 2217)      LOS: 2 days   Marinda Elk  [facility]  03/31/2023, 9:01 AM

## 2023-03-31 NOTE — [place] FL2 (Signed)
[place] MEDICAID FL2 LEVEL OF CARE FORM     IDENTIFICATION  Patient Name: Jason [name] Birthdate: Jul 13, 1941 Sex: male Admission Date (Current Location): 03/29/2023  [facility] and [place] Number:  Producer, television/film/video and Address:  [facility],  [address] [place], [place] [address]      Provider Number: [id number]  Attending Physician Name and Address:  David Stall, Darin Engels, MD  Relative Name and Phone Number:  Kelsen Keenan (spouse) Ph: [phone]    Current Level of Care: Hospital Recommended Level of Care: Skilled Nursing Facility Prior Approval Number:    Date Approved/Denied:   PASRR Number: [id number]  Discharge Plan: SNF    Current Diagnoses: Patient Active Problem List   Diagnosis Date Noted   Subacute osteomyelitis of right foot (HCC) 03/30/2023   Diabetic ulcer of right midfoot associated with type 2 diabetes mellitus, with muscle involvement without evidence of necrosis (HCC) 03/29/2023   Gangrene of right foot (HCC) 03/29/2023   Right foot infection 03/29/2023   Encounter for general adult medical examination with abnormal findings 03/19/2022   Insulin-requiring or dependent type II diabetes mellitus (HCC) 03/16/2022   Atrial flutter (HCC) 08/02/2021   Nonrheumatic mitral valve stenosis 01/27/2021   Atherosclerosis of aorta (HCC) 09/30/2020   Abnormal electrocardiogram (ECG) (EKG) 09/30/2020   Thiamine deficiency 07/04/2020   Ulcer of right foot with necrosis of bone (HCC)    Malignant neoplasm of urinary bladder (HCC) 07/11/2018   Chronic idiopathic constipation 06/12/2018   Complex sleep apnea syndrome 01/12/2016   OSA (obstructive sleep apnea) 12/18/2015   Benign prostatic hyperplasia 02/18/2014   PAD (peripheral artery disease) (HCC) 02/18/2014   DEGENERATIVE JOINT DISEASE 02/20/2008   Diabetes mellitus type 2 with atherosclerosis of arteries of extremities (HCC) 01/26/2008   Hyperlipidemia with target LDL less than 70 01/26/2008    Mitral valve disorder 01/26/2008   GERD 01/26/2008   Irritable bowel syndrome 01/26/2008    Orientation RESPIRATION BLADDER Height & Weight     Self, Time, Situation, Place  Normal Continent Weight: 259 lb (117.5 kg) Height:  6\' 8"  (203.2 cm)  BEHAVIORAL SYMPTOMS/MOOD NEUROLOGICAL BOWEL NUTRITION STATUS   (N/A)  (N/A) Continent Diet (Heart healthy/carb modified)  AMBULATORY STATUS COMMUNICATION OF NEEDS Skin   Limited Assist Verbally Surgical wounds                       Personal Care Assistance Level of Assistance  Bathing, Feeding, Dressing Bathing Assistance: Limited assistance Feeding assistance: Independent Dressing Assistance: Limited assistance     Functional Limitations Info  Sight, Hearing, Speech Sight Info: Adequate Hearing Info: Adequate Speech Info: Adequate    SPECIAL CARE FACTORS FREQUENCY  PT (By licensed PT), OT (By licensed OT)     PT Frequency: 5x's/week OT Frequency: 5x's/week            Contractures Contractures Info: Not present    Additional Factors Info  Code Status, Allergies, Insulin Sliding Scale Code Status Info: Full Allergies Info: Bactrim (Sulfamethoxazole-trimethoprim)   Insulin Sliding Scale Info: See discharge summary       Current Medications (03/31/2023):  This is the current hospital active medication list Current Facility-Administered Medications  Medication Dose Route Frequency Provider Last Rate Last Admin   (feeding supplement) PROSource Plus liquid 30 mL  30 mL Oral TID BM Marinda Elk, MD   30 mL at 03/31/23 0942   0.9 %  sodium chloride infusion   Intravenous PRN Opyd, Lavone Neri, MD  acetaminophen (TYLENOL) tablet 650 mg  650 mg Oral Q6H PRN Opyd, Lavone Neri, MD   650 mg at 03/30/23 1610   Or   acetaminophen (TYLENOL) suppository 650 mg  650 mg Rectal Q6H PRN Opyd, Lavone Neri, MD       bisacodyl (DULCOLAX) EC tablet 5 mg  5 mg Oral Daily PRN Opyd, Lavone Neri, MD       cefTRIAXone (ROCEPHIN) 2 g in  sodium chloride 0.9 % 100 mL IVPB  2 g Intravenous Q24H Opyd, Lavone Neri, MD 200 mL/hr at 03/31/23 0024 2 g at 03/31/23 0024   enoxaparin (LOVENOX) injection 120 mg  120 mg Subcutaneous Q12H Jamse Mead, RPH   120 mg at 03/31/23 9604   HYDROcodone-acetaminophen (NORCO/VICODIN) 5-325 MG per tablet 1 tablet  1 tablet Oral Q4H PRN Opyd, Lavone Neri, MD       insulin aspart (novoLOG) injection 0-15 Units  0-15 Units Subcutaneous TID WC Marinda Elk, MD   8 Units at 03/31/23 0943   insulin aspart (novoLOG) injection 0-5 Units  0-5 Units Subcutaneous QHS Marinda Elk, MD       insulin aspart (novoLOG) injection 4 Units  4 Units Subcutaneous TID WC Marinda Elk, MD   4 Units at 03/31/23 0947   insulin glargine-yfgn (SEMGLEE) injection 15 Units  15 Units Subcutaneous BID Marinda Elk, MD   15 Units at 03/31/23 0943   LORazepam (ATIVAN) injection 1 mg  1 mg Intravenous Once PRN Opyd, Lavone Neri, MD       metroNIDAZOLE (FLAGYL) tablet 500 mg  500 mg Oral Q12H Opyd, Lavone Neri, MD   500 mg at 03/31/23 5409   multivitamin with minerals tablet 1 tablet  1 tablet Oral Daily Marinda Elk, MD   1 tablet at 03/31/23 0943   mupirocin ointment (BACTROBAN) 2 % 1 Application  1 Application Nasal BID Briscoe Deutscher, MD   1 Application at 03/31/23 8119   nutrition supplement (JUVEN) (JUVEN) powder packet 1 packet  1 packet Oral BID BM Marinda Elk, MD       polyethylene glycol (MIRALAX / GLYCOLAX) packet 17 g  17 g Oral Daily PRN Opyd, Lavone Neri, MD       polyethylene glycol (MIRALAX / GLYCOLAX) packet 17 g  17 g Oral BID Marinda Elk, MD   17 g at 03/31/23 0942   simvastatin (ZOCOR) tablet 40 mg  40 mg Oral q1800 Briscoe Deutscher, MD   40 mg at 03/30/23 1818   vancomycin (VANCOREADY) IVPB 1250 mg/250 mL  1,250 mg Intravenous Q12H Hessie Knows, RPH 166.7 mL/hr at 03/31/23 0953 1,250 mg at 03/31/23 1478     Discharge Medications: Please see discharge summary for  a list of discharge medications.  Relevant Imaging Results:  Relevant Lab Results:   Additional Information SSN: [id number]  [name], LCSW

## 2023-03-31 NOTE — Inpatient Diabetes Management (Signed)
Inpatient Diabetes Program Recommendations  AACE/ADA: New Consensus Statement on Inpatient Glycemic Control (2015)  Target Ranges:  Prepandial:   less than 140 mg/dL      Peak postprandial:   less than 180 mg/dL (1-2 hours)      Critically ill patients:  140 - 180 mg/dL   Lab Results  Component Value Date   GLUCAP 253 (H) 03/31/2023   HGBA1C 13.3 (H) 03/29/2023    Review of Glycemic Control  Diabetes history: DM2 Outpatient Diabetes medications: Toujeo 30 QHS, metformin 500 BID Current orders for Inpatient glycemic control: Semglee 15 BID, Novolog 0-15 TID with meals and 0-5 HS + 4 units TID  HgbA1C - 13.3% 292, 253 mg/dL today.  Hyperglycemia likely d/t Decadron 10 mg during surgery  Inpatient Diabetes Program Recommendations:    Continue Novolog titration if post-prandials > 180 mg/dL.  Follow.  Thank you. Ailene Ards, RD, LDN, CDCES Inpatient Diabetes Coordinator [phone]

## 2023-04-01 DIAGNOSIS — L089 Local infection of the skin and subcutaneous tissue, unspecified: Secondary | ICD-10-CM | POA: Diagnosis not present

## 2023-04-01 LAB — CBC
HCT: 36.5 % — ABNORMAL LOW (ref 39.0–52.0)
Hemoglobin: 12.2 g/dL — ABNORMAL LOW (ref 13.0–17.0)
MCH: 30.3 pg (ref 26.0–34.0)
MCHC: 33.4 g/dL (ref 30.0–36.0)
MCV: 90.6 fL (ref 80.0–100.0)
Platelets: 201 10*3/uL (ref 150–400)
RBC: 4.03 MIL/uL — ABNORMAL LOW (ref 4.22–5.81)
RDW: 11.7 % (ref 11.5–15.5)
WBC: 8.4 10*3/uL (ref 4.0–10.5)
nRBC: 0 % (ref 0.0–0.2)

## 2023-04-01 LAB — BASIC METABOLIC PANEL
Anion gap: 8 (ref 5–15)
BUN: 27 mg/dL — ABNORMAL HIGH (ref 8–23)
CO2: 26 mmol/L (ref 22–32)
Calcium: 8.5 mg/dL — ABNORMAL LOW (ref 8.9–10.3)
Chloride: 101 mmol/L (ref 98–111)
Creatinine, Ser: 0.97 mg/dL (ref 0.61–1.24)
GFR, Estimated: >60 mL/min (ref >60)
Glucose, Bld: 108 mg/dL — ABNORMAL HIGH (ref 70–99)
Potassium: 3.4 mmol/L — ABNORMAL LOW (ref 3.5–5.1)
Sodium: 135 mmol/L (ref 135–145)

## 2023-04-01 LAB — GLUCOSE, CAPILLARY
Glucose-Capillary: 97 mg/dL (ref 70–99)
Glucose-Capillary: 197 mg/dL — ABNORMAL HIGH (ref 70–99)
Glucose-Capillary: 127 mg/dL — ABNORMAL HIGH (ref 70–99)
Glucose-Capillary: 134 mg/dL — ABNORMAL HIGH (ref 70–99)
Glucose-Capillary: 123 mg/dL — ABNORMAL HIGH (ref 70–99)

## 2023-04-01 LAB — AEROBIC/ANAEROBIC CULTURE W GRAM STAIN (SURGICAL/DEEP WOUND)

## 2023-04-01 MED ORDER — RIVAROXABAN 10 MG PO TABS: 20.0000 mg | ORAL_TABLET | Freq: Every day | ORAL | Status: DC

## 2023-04-01 MED ORDER — AMOXICILLIN-POT CLAVULANATE 875-125 MG PO TABS
1.0000 | ORAL_TABLET | Freq: Two times a day (BID) | ORAL | Status: DC
  Administered 2023-04-01 – 2023-04-02 (×3): 1 via ORAL
  Filled 2023-04-01 (×3): qty 1

## 2023-04-01 MED ORDER — METFORMIN HCL 500 MG PO TABS
1000.0000 mg | ORAL_TABLET | Freq: Two times a day (BID) | ORAL | Status: DC
  Administered 2023-04-01 – 2023-04-02 (×3): 1000 mg via ORAL
  Filled 2023-04-01 (×3): qty 2

## 2023-04-01 MED ORDER — POTASSIUM CHLORIDE CRYS ER 20 MEQ PO TBCR
20.0000 meq | EXTENDED_RELEASE_TABLET | Freq: Two times a day (BID) | ORAL | Status: DC
  Administered 2023-04-01 – 2023-04-02 (×3): 20 meq via ORAL
  Filled 2023-04-01 (×3): qty 1

## 2023-04-01 MED ORDER — DOXYCYCLINE HYCLATE 100 MG PO TABS
100.0000 mg | ORAL_TABLET | Freq: Two times a day (BID) | ORAL | Status: DC
  Administered 2023-04-01 – 2023-04-02 (×3): 100 mg via ORAL
  Filled 2023-04-01 (×3): qty 1

## 2023-04-01 MED ORDER — RIVAROXABAN 10 MG PO TABS
20.0000 mg | ORAL_TABLET | Freq: Once | ORAL | Status: AC
  Administered 2023-04-01: 20 mg via ORAL
  Filled 2023-04-01: qty 2

## 2023-04-01 MED ORDER — POTASSIUM CHLORIDE CRYS ER 20 MEQ PO TBCR
30.0000 meq | EXTENDED_RELEASE_TABLET | Freq: Two times a day (BID) | ORAL | Status: AC
  Administered 2023-04-01 (×2): 30 meq via ORAL
  Filled 2023-04-01 (×2): qty 1

## 2023-04-01 NOTE — Progress Notes (Signed)
[facility] PROGRESS NOTE    Progress Note  Jason [name]  WGN:[id number] DOB: 1941-10-23 DOA: 03/29/2023 PCP: [name], MD     Brief Narrative:   Jason [name] is an 82 y.o. male past medical history significant for poorly controlled insulin-dependent diabetes mellitus, peripheral artery tibial disease, atrial flutter on Xarelto obstructive sleep apnea CPAP intolerant, severe mitral stenosis who has missed his follow-up appointments and is noncompliant with his medication.  Comes into the emergency room for nonhealing right foot ulcer. MRI of the right foot positive for osteomyelitis of the first metatarsal head no abscess with edema of the plantar muscles and tendon  Assessment/Plan:   Osteomyelitis diabetic foot ulcer/Right foot infection and right lower extremity cellulitis: Septic on admission. Podiatrist was consulted and he status post transmetatarsal amputation of the right foot on 03/30/2023 Transition to oral doxycycline and Augmentin and monitor for 24 hours. ABI's showed resting ankle-brachial index noncompressible of the right lower extremity. Resume home dose of Xarelto, he was being noncompliant with most of his medications at home. PT has been consulted will need skilled nursing facility.  History of atrial flutter: Now in sinus rhythm, hold Xarelto although he has not taken it in several days.  With a history of cardioversion not on any AV nodal blocking agents load due to heart rate in the 50s and 60s. Has a history of noncompliance with his medication.  Hypokalemia: Replete orally recheck in the morning.  Insulin-dependent diabetes mellitus type 2: A1c of 13 which points towards noncompliance., blood glucose still elevated, increase long-acting insulin and change sliding scale to moderate with meal coverage. Resume oral metformin.  PAD (peripheral artery disease) (HCC): Continue statins.  History of severe mitral stenosis: He relates he has been  followed by cardiology by this, he relates he will not want surgery for his mitral stenosis. Try to call to explain his wishes I was unsuccessful. I did talk to him about palliative care and hospice he would like to proceed with meeting with palliative care.  DVT prophylaxis: none Family Communication:none Status is: Inpatient Remains inpatient appropriate because: acute osteomyelitis    Code Status:     Code Status Orders  (From admission, onward)           Start     Ordered   03/29/23 1729  Full code  Continuous       Question:  By:  Answer:  Consent: discussion documented in EHR   03/29/23 1729           Code Status History     Date Active Date Inactive Code Status Order ID Comments User Context   06/13/2018 2202 06/15/2018 1712 Full Code [id number]  Colon Branch, MD Inpatient   11/16/2015 1310 11/19/2015 1429 Full Code [id number]  Osvaldo Shipper, MD Inpatient   11/05/2015 1643 11/07/2015 1356 Full Code [id number]  Rhetta Mura, MD Inpatient         IV Access:   Peripheral IV   Procedures and diagnostic studies:   DG Foot 2 Views Right  Result Date: 03/30/2023 CLINICAL DATA:  252351 Post-operative state 252351 EXAM: RIGHT FOOT - 2 VIEW COMPARISON:  None Available. FINDINGS: Postsurgical changes of transmetatarsal amputation. Surgical margins are smooth. Expected soft tissue swelling and gas. Soft tissue calcifications of the lower leg, unchanged. IMPRESSION: Postsurgical changes of transmetatarsal amputation. Electronically Signed   By: Caprice Renshaw M.D.   On: 03/30/2023 16:12     Medical Consultants:   None.  Subjective:    Jason [name] pain is controlled, had a bowel movement.  Objective:    Vitals:   03/31/23 1343 03/31/23 1812 03/31/23 2137 04/01/23 0535  BP: 121/64 (!) 119/57 111/76 (!) 112/59  Pulse: 70 73 72 65  Resp: 18 20 16 16   Temp: 98.2 F (36.8 C) 99 F (37.2 C) 99 F (37.2 C) 98.4 F (36.9 C)  TempSrc: Oral  Oral Oral   SpO2: 100% 97% 98% 96%  Weight:      Height:       SpO2: 96 % O2 Flow Rate (L/min): 8 L/min   Intake/Output Summary (Last 24 hours) at 04/01/2023 0826 Last data filed at 04/01/2023 0600 Gross per 24 hour  Intake 1120 ml  Output 1050 ml  Net 70 ml    Filed Weights   03/29/23 2028 03/30/23 1403  Weight: 117.5 kg 117.5 kg    Exam: General exam: In no acute distress. Respiratory system: Good air movement and clear to auscultation. Cardiovascular system: S1 & S2 heard, RRR. No JVD. Gastrointestinal system: Abdomen is nondistended, soft and nontender.  Extremities: No pedal edema. Skin: No rashes, lesions or ulcers Psychiatry: Judgement and insight appear normal. Mood & affect appropriate. Data Reviewed:    Labs: Basic Metabolic Panel: Recent Labs  Lab 03/29/23 1412 03/29/23 1600 03/30/23 0341 03/31/23 0344 04/01/23 0343  NA 131* 130* 134* 127* 135  K 4.4 4.3 3.6 4.1 3.4*  CL 94* 96* 100 97* 101  CO2 28 26 26 22 26   GLUCOSE 490* 475* 150* 288* 108*  BUN 14 15 13 18  27*  CREATININE 1.33 1.19 0.93 0.94 0.97  CALCIUM 9.5 8.9 8.7* 8.4* 8.5*    GFR Estimated Creatinine Clearance: 88.4 mL/min (by C-G formula based on SCr of 0.97 mg/dL). Liver Function Tests: Recent Labs  Lab 03/29/23 1412 03/29/23 1600  AST 12 13*  ALT 12 15  ALKPHOS 61 57  BILITOT 0.9 1.2  PROT 7.7 7.9  ALBUMIN 3.9 3.6    No results for input(s): "LIPASE", "AMYLASE" in the last 168 hours. No results for input(s): "AMMONIA" in the last 168 hours. Coagulation profile No results for input(s): "INR", "PROTIME" in the last 168 hours. COVID-19 Labs  Recent Labs    03/30/23 0341  CRP <0.5     Lab Results  Component Value Date   SARSCOV2NAA NOT DETECTED 07/21/2019   SARSCOV2NAA NEGATIVE 05/14/2019   SARSCOV2NAA NEGATIVE 05/07/2019    CBC: Recent Labs  Lab 03/29/23 1412 03/29/23 1600 03/30/23 0341 03/31/23 0344 04/01/23 0343  WBC 5.3 4.9 4.4 8.4 8.4  NEUTROABS 3.7   --   --   --   --   HGB 14.4 13.8 13.0 13.3 12.2*  HCT 42.9 41.7 39.5 40.1 36.5*  MCV 89.7 90.1 90.2 90.9 90.6  PLT 220.0 207 191 206 201    Cardiac Enzymes: No results for input(s): "CKTOTAL", "CKMB", "CKMBINDEX", "TROPONINI" in the last 168 hours. BNP (last 3 results) No results for input(s): "PROBNP" in the last 8760 hours. CBG: Recent Labs  Lab 03/31/23 0743 03/31/23 1118 03/31/23 1544 03/31/23 2139 04/01/23 0713  GLUCAP 253* 235* 351* 225* 97    D-Dimer: No results for input(s): "DDIMER" in the last 72 hours. Hgb A1c: Recent Labs    03/29/23 1412  HGBA1C 13.3*    Lipid Profile: Recent Labs    03/29/23 1412  CHOL 188  HDL 50.40  LDLCALC 114*  TRIG 118.0  CHOLHDL 4    Thyroid function studies:  Recent Labs    03/29/23 1412  TSH 1.47    Anemia work up: No results for input(s): "VITAMINB12", "FOLATE", "FERRITIN", "TIBC", "IRON", "RETICCTPCT" in the last 72 hours. Sepsis Labs: Recent Labs  Lab 03/29/23 1600 03/30/23 0341 03/31/23 0344 04/01/23 0343  WBC 4.9 4.4 8.4 8.4    Microbiology Recent Results (from the past 240 hour(s))  Surgical PCR screen     Status: Abnormal   Collection Time: 03/29/23 11:58 PM   Specimen: Nasal Mucosa; Nasal Swab  Result Value Ref Range Status   MRSA, PCR POSITIVE (A) NEGATIVE Final   Staphylococcus aureus POSITIVE (A) NEGATIVE Final    Comment: (NOTE) The Xpert SA Assay (FDA approved for NASAL specimens in patients 12 years of age and older), is one component of a comprehensive surveillance program. It is not intended to diagnose infection nor to guide or monitor treatment. Performed at [facility] : [facility], [address], [place], [place] [address]   Aerobic/Anaerobic Culture w Gram Stain (surgical/deep wound)     Status: None (Preliminary result)   Collection Time: 03/30/23  2:53 PM   Specimen: PATH Bone resection; Tissue  Result Value Ref Range Status   Specimen Description   Final    BONE  RIGHT FOOT Performed at [facility] [place], [address], [place], [place] [address]    Special Requests   Final    NONE Performed at [facility], [address] [place], [place] [address]    Gram Stain NO WBC SEEN NO ORGANISMS SEEN   Final   Culture   Final    NO GROWTH < 24 HOURS Performed at [facility], [address] [place], [place] [address]    Report Status PENDING  Incomplete     Medications:    (feeding supplement) PROSource Plus  30 mL Oral TID BM   amoxicillin-clavulanate  1 tablet Oral Q12H   doxycycline  100 mg Oral Q12H   enoxaparin (LOVENOX) injection  120 mg Subcutaneous Q12H   insulin aspart  0-15 Units Subcutaneous TID WC   insulin aspart  0-5 Units Subcutaneous QHS   insulin aspart  8 Units Subcutaneous TID WC   insulin glargine-yfgn  25 Units Subcutaneous BID   multivitamin with minerals  1 tablet Oral Daily   mupirocin ointment  1 Application Nasal BID   nutrition supplement (JUVEN)  1 packet Oral BID BM   polyethylene glycol  17 g Oral BID   simvastatin  40 mg Oral q1800   Continuous Infusions:  sodium chloride        LOS: 3 days   Marinda Elk  [facility]  04/01/2023, 8:26 AM

## 2023-04-01 NOTE — Plan of Care (Signed)
  Problem: Education: Goal: Ability to describe self-care measures that may prevent or decrease complications (Diabetes Survival Skills Education) will improve Outcome: Progressing Goal: Individualized Educational Video(s) Outcome: Progressing   

## 2023-04-01 NOTE — TOC Progression Note (Signed)
Transition of Care [facility]) - Progression Note   Patient Details  Name: Jason [name] MRN: [id number] Date of Birth: 1940/12/18  [facility] CM/SW Contact  [name], LCSW Phone Number: 04/01/2023, 2:41 PM  Clinical Narrative: CSW met with patient to discuss discharge planning. Patient prefers to return home rather than go to SNF and would like to go home with HHPT/OT. Patient requested [facility] for [facility]. CSW made [facility] referral to Cindie with [facility], which was accepted. HH orders requested.  Expected Discharge Plan: Home w Home Health Services Barriers to Discharge: Continued Medical Work up  Expected Discharge Plan and Services In-house Referral: Clinical Social Work Post Acute Care Choice: Home Health Living arrangements for the past 2 months: Single Family Home           DME Arranged: N/A DME Agency: NA HH Arranged: PT, OT HH Agency: [facility] Date HH Agency Contacted: 04/01/23 Time HH Agency Contacted: 1434 Representative spoke with at [facility] Agency: Cindie  Social Determinants of Health (SDOH) Interventions SDOH Screenings   Food Insecurity: No Food Insecurity (03/29/2023)  Housing: Low Risk  (03/29/2023)  Transportation Needs: No Transportation Needs (03/29/2023)  Utilities: Not At Risk (03/29/2023)  Alcohol Screen: Low Risk  (06/01/2022)  Depression (PHQ2-9): Low Risk  (06/01/2022)  Financial Resource Strain: Low Risk  (06/01/2022)  Physical Activity: Sufficiently Active (06/01/2022)  Social Connections: Socially Integrated (06/01/2022)  Stress: Stress Concern Present (06/01/2022)  Tobacco Use: Medium Risk (03/31/2023)   Readmission Risk Interventions     No data to display

## 2023-04-01 NOTE — Progress Notes (Signed)
Physical Therapy Treatment [name] MRN: [id number] DOB: 1940/12/07 Today's Date: 04/01/2023   History of Present Illness 82 yo male presents to therapy following transmetatarsal amputation of R LE on 03/30/2023 secondary to osteomyelitis of R first ray and ulcer with necrosis of bone. Pt is currently R LE NWB with CAM boot donned. Pt presented to ED from home on 03/29/2023 with reports of ulcer on R foot enlarging. Pt had elevated blood glucose of 475. Pt tx with IV ABX and MRI of R foot ordered  and podiatry consulted. Pt has PMH including but not limited to bladder Ca, COPD, diverticulitis, GERD, HLD, IBS, memory loss, mitral valve prolapse, OSA, DM II, peripheral insufficiency and multiple digit amputations on R foot.    PT Comments    Pt received in bed, states he has been ambulating to/from bathroom with RW and non-compliant with NWB restrictions R LE. Attempted standing and gait with pt who was unable to advance without weight bearing through R LE with RW. Pt assisted back to bed. Discussed importance of weight restriction for proper healing with concerns for single step at home and pt stating he would "just use the walker at home". Pt will benefit from further education from Multi-disciplinary team as well as additional equipment for safe d/c home if pt refuses skilled nursing.    Recommendations for follow up therapy are one component of a multi-disciplinary discharge planning process, led by the attending physician.  Recommendations may be updated based on patient status, additional functional criteria and insurance authorization.  Follow Up Recommendations  Can patient physically be transported by private vehicle: No    Assistance Recommended at Discharge Frequent or constant Supervision/Assistance  Patient can return home with the following Assistance with cooking/housework;Assist for transportation;Help with stairs or ramp for entrance;A lot of help with  walking and/or transfers;A lot of help with bathing/dressing/bathroom;A little help with bathing/dressing/bathroom   Equipment Recommendations  None recommended by PT (Wife reports RW at home)    Recommendations for Other Services       Precautions / Restrictions Precautions Precautions: Fall (Right CAM boot at all times) Required Braces or Orthoses: Other Brace Other Brace: R CAM boot at all times Restrictions Weight Bearing Restrictions: Yes RLE Weight Bearing: Non weight bearing Other Position/Activity Restrictions:  (NWB in CAM boot per Podiatry post-op note)     Mobility  Bed Mobility Overal bed mobility: Modified Independent Bed Mobility: Supine to Sit, Sit to Supine     Supine to sit: Modified independent (Device/Increase time), HOB elevated Sit to supine: Modified independent (Device/Increase time)        Transfers Overall transfer level: Needs assistance Equipment used: Rolling walker (2 wheels) Transfers: Sit to/from Stand, Bed to chair/wheelchair/BSC Sit to Stand: Supervision (However non compliant with weight bearing restrictions)           General transfer comment: Pt educated on weight bearing restrictions and given information/hand out to explain for hopeful compliance    Ambulation/Gait               General Gait Details:  (Gait attempted with Right CAM boot and RW, Pt unable to maintain NWB R LE, therefore deferred. Pt however has been non-compliant walking in room with RW)   Stairs Stairs: Yes       General stair comments:  (Pt has a single step to enter home without railing. Did not attempt due to safety concerns)   Wheelchair Mobility    Modified Rankin (  Stroke Patients Only)       Balance Overall balance assessment: Needs assistance Sitting-balance support: No upper extremity supported Sitting balance-Leahy Scale: Good     Standing balance support: Bilateral upper extremity supported, During functional activity, Reliant  on assistive device for balance Standing balance-Leahy Scale: Poor Standing balance comment: min guard to min A to maintain static standing and extensive cues for R LE NWB                            Cognition Arousal/Alertness: Awake/alert Behavior During Therapy: WFL for tasks assessed/performed Overall Cognitive Status: No family/caregiver present to determine baseline cognitive functioning                                 General Comments:  (Pt has been non-compliant with Wt Bearing restrictions and appears to not fully comprehend significance.)        Exercises      General Comments General comments (skin integrity, edema, etc.):  (Pt educated at length on reason for NWB status of R LE post surgery. Recommendations for w/c as primary means of mobility)      Pertinent Vitals/Pain Pain Assessment Pain Assessment: 0-10 Pain Score: 3  Pain Location: R LE Pain Descriptors / Indicators: Aching Pain Intervention(s): Limited activity within patient's tolerance    Home Living                          Prior Function            PT Goals (current goals can now be found in the care plan section) Acute Rehab PT Goals Patient Stated Goal: breakdance    Frequency    Min 1X/week      PT Plan      Co-evaluation              AM-PAC PT "6 Clicks" Mobility   Outcome Measure  Help needed turning from your back to your side while in a flat bed without using bedrails?: A Little Help needed moving from lying on your back to sitting on the side of a flat bed without using bedrails?: A Little Help needed moving to and from a bed to a chair (including a wheelchair)?: A Lot Help needed standing up from a chair using your arms (e.g., wheelchair or bedside chair)?: A Lot Help needed to walk in hospital room?: A Lot Help needed climbing 3-5 steps with a railing? : Total 6 Click Score: 13    End of Session Equipment Utilized During  Treatment: Gait belt;Other (comment) (R CAM Boot) Activity Tolerance: Other (comment) (Patient limited by inability to maintain weight bearing restrictions) Patient left: in bed;with call bell/phone within reach Nurse Communication: Mobility status;Precautions;Weight bearing status;Need for lift equipment (Concerns for d/c home) PT Visit Diagnosis: Unsteadiness on feet (R26.81);Other abnormalities of gait and mobility (R26.89);Muscle weakness (generalized) (M62.81);Difficulty in walking, not elsewhere classified (R26.2)     Time: 2841-3244 PT Time Calculation (min) (ACUTE ONLY): 17 min  Charges:  $Therapeutic Activity: 8-22 mins                    Zadie Cleverly, PTA  Jannet Askew 04/01/2023, 4:28 PM

## 2023-04-01 NOTE — Progress Notes (Signed)
PODIATRY PROGRESS NOTE Patient Name: Jason [name]  DOB August 31, 1941 DOA 03/29/2023  Hospital Day: 4  Assessment:  82 y.o. male with PMHx significant for  poorly controlled insulin-dependent diabetes mellitus, PAD, atrial flutter on Xarelto,  with Right foot diabetic foot infection 2/2 chronic ulceration plantar 1st met head with concern for underlying osteomyelitis  POD 2 s/p R foot transmetatarsal amputation, progressing postop without evidence of necrosis or residual infection at surgical site    WBC 4.9 ESR 29, CRP <0.5 MRI R foot : 1. Deep skin wound about the plantar aspect of the first metatarsal head with mild marrow edema about the plantar aspect of the first metatarsal head concerning for osteomyelitis. 2. No fluid collection or abscess. 3. Postsurgical changes with prior amputation of the right foot through the metatarsophalangeal joints of the first and fifth digit and through metatarsal heads of the second through fourth digits. 4. Edema of the plantar muscles and tendons suggesting diabetic myopathy/myositis.   Plan:  - S/p transmetatarsal amputation, doing well without complication at surgical site currently - Ok to transition to PO abx for 10 days upon discharge recommend doxycycline 100 mg BID  - Anticoagulation: resume per primary - Wound care: Dressing changed today, to remain intact until follow up in office in 1 week, office will schedule - WB status: NWB to R foot post op in CAM boot until 1-2 weeks post op - Will sign off at this time. If patient still admitted next week Wednesday will change dressing inpatient at that time, otherwise can follow up in office Thursday or Friday, office will arrange.         Corinna Gab, DPM [facility]    Subjective:  Patient seen bedside, discussed findings and post op course. All questions answered.  Objective:   Vitals:   03/31/23 1812 03/31/23 2137  BP: (!) 119/57 111/76  Pulse: 73 72  Resp:  20 16  Temp: 99 F (37.2 C) 99 F (37.2 C)  SpO2: 97% 98%       Latest Ref Rng & Units 04/01/2023    3:43 AM 03/31/2023    3:44 AM 03/30/2023    3:41 AM  CBC  WBC 4.0 - 10.5 K/uL 8.4  8.4  4.4   Hemoglobin 13.0 - 17.0 g/dL 16.1  09.6  04.5   Hematocrit 39.0 - 52.0 % 36.5  40.1  39.5   Platelets 150 - 400 K/uL 201  206  191        Latest Ref Rng & Units 04/01/2023    3:43 AM 03/31/2023    3:44 AM 03/30/2023    3:41 AM  BMP  Glucose 70 - 99 mg/dL 409  811  914   BUN 8 - 23 mg/dL 27  18  13    Creatinine 0.61 - 1.24 mg/dL 7.82  9.56  2.13   Sodium 135 - 145 mmol/L 135  127  134   Potassium 3.5 - 5.1 mmol/L 3.4  4.1  3.6   Chloride 98 - 111 mmol/L 101  97  100   CO2 22 - 32 mmol/L 26  22  26    Calcium 8.9 - 10.3 mg/dL 8.5  8.4  8.7     General: AAOx3, NAD Lower Extremity Exam Vasc:     R - PT palpable, DP palpable.                L - PT palpable, DP palpable. Cap refill <3 sec  to digits   Derm:    R foot TMA site healing well no necrosis or dehisence.                  L - Normal temp/texture/turgor with no open lesion or clinical signs of infection   MSK:     R - s/p R TMA               L -  No gross deformities. Compartments soft, non-tender, compressible   Neuro:   R - Gross sensation diminshed. Gross motor function intact                 L - Gross sensation diminished. Gross motor function intact   Radiology:  Results reviewed. See assessment for pertinent imaging results

## 2023-04-01 NOTE — Care Management Important Message (Signed)
Important Message  Patient Details IM Letter given. Name: Jason [name] MRN: [id number] Date of Birth: 1940/12/20   Medicare Important Message Given:  Yes     Caren Macadam 04/01/2023, 3:03 PM

## 2023-04-01 NOTE — Progress Notes (Signed)
Pharmacy Antibiotic Note  Jason [name] is a 82 y.o. male admitted on 03/29/2023 with  osteomyelitis R foot in setting of DM .  Pharmacy was consulted for vanc dosing, currently on 1250 mg IV q12h.  Also on Rocephin 2 grams IV q24h and Flagyl 500 mg PO q12h per MD.  Patient underwent s/p transmetatarsal amputation R foot on 03/30/23.  See note from podiatry this AM recommending transition antibiotic regimen to oral doxycycline 100 mg BID x 10 days at discharge.  SCr remains stable.  Plan: At discharge anticipate transitioning antibiotic to oral doxycycline 100 mg BID x 10 days.  Height: 6\' 8"  (203.2 cm) Weight: 117.5 kg (259 lb) IBW/kg (Calculated) : 96  Temp (24hrs), Avg:98.5 F (36.9 C), Min:98 F (36.7 C), Max:99 F (37.2 C)  Recent Labs  Lab 03/29/23 1412 03/29/23 1600 03/30/23 0341 03/31/23 0344 04/01/23 0343  WBC 5.3 4.9 4.4 8.4 8.4  CREATININE 1.33 1.19 0.93 0.94 0.97     Estimated Creatinine Clearance: 88.4 mL/min (by C-G formula based on SCr of 0.97 mg/dL).    Allergies  Allergen Reactions   Bactrim [Sulfamethoxazole-Trimethoprim] Rash    Thank you for allowing pharmacy to be a part of this patient's care. Elie Goody, PharmD, BCPS Clinical Pharmacist 04/01/2023  7:59 AM

## 2023-04-02 DIAGNOSIS — E1151 Type 2 diabetes mellitus with diabetic peripheral angiopathy without gangrene: Secondary | ICD-10-CM | POA: Diagnosis not present

## 2023-04-02 DIAGNOSIS — I70209 Unspecified atherosclerosis of native arteries of extremities, unspecified extremity: Secondary | ICD-10-CM

## 2023-04-02 DIAGNOSIS — L97519 Non-pressure chronic ulcer of other part of right foot with unspecified severity: Secondary | ICD-10-CM | POA: Diagnosis not present

## 2023-04-02 DIAGNOSIS — L089 Local infection of the skin and subcutaneous tissue, unspecified: Secondary | ICD-10-CM | POA: Diagnosis not present

## 2023-04-02 DIAGNOSIS — E119 Type 2 diabetes mellitus without complications: Secondary | ICD-10-CM | POA: Diagnosis not present

## 2023-04-02 LAB — BASIC METABOLIC PANEL
Anion gap: 7 (ref 5–15)
BUN: 27 mg/dL — ABNORMAL HIGH (ref 8–23)
CO2: 23 mmol/L (ref 22–32)
Calcium: 8.3 mg/dL — ABNORMAL LOW (ref 8.9–10.3)
Chloride: 102 mmol/L (ref 98–111)
Creatinine, Ser: 0.91 mg/dL (ref 0.61–1.24)
GFR, Estimated: >60 mL/min (ref >60)
Glucose, Bld: 206 mg/dL — ABNORMAL HIGH (ref 70–99)
Potassium: 4.2 mmol/L (ref 3.5–5.1)
Sodium: 132 mmol/L — ABNORMAL LOW (ref 135–145)

## 2023-04-02 LAB — AEROBIC/ANAEROBIC CULTURE W GRAM STAIN (SURGICAL/DEEP WOUND): Gram Stain: NONE SEEN

## 2023-04-02 LAB — GLUCOSE, CAPILLARY
Glucose-Capillary: 130 mg/dL — ABNORMAL HIGH (ref 70–99)
Glucose-Capillary: 108 mg/dL — ABNORMAL HIGH (ref 70–99)

## 2023-04-02 MED ORDER — TOUJEO MAX SOLOSTAR 300 UNIT/ML ~~LOC~~ SOPN
50.0000 [IU] | PEN_INJECTOR | Freq: Every day | SUBCUTANEOUS | 0 refills | Status: DC
Start: 2023-04-02 — End: 2023-05-10

## 2023-04-02 MED ORDER — INSULIN ASPART 100 UNIT/ML FLEXPEN: 8.0000 [IU] | PEN_INJECTOR | Freq: Three times a day (TID) | SUBCUTANEOUS | 11 refills | Status: DC

## 2023-04-02 MED ORDER — AMOXICILLIN-POT CLAVULANATE 875-125 MG PO TABS: 1.0000 | ORAL_TABLET | Freq: Two times a day (BID) | ORAL | 0 refills | Status: AC

## 2023-04-02 MED ORDER — METFORMIN HCL 500 MG PO TABS
1000.0000 mg | ORAL_TABLET | Freq: Two times a day (BID) | ORAL | 0 refills | Status: DC
Start: 2023-04-02 — End: 2024-02-11

## 2023-04-02 MED ORDER — DOXYCYCLINE HYCLATE 100 MG PO TABS: 100.0000 mg | ORAL_TABLET | Freq: Two times a day (BID) | ORAL | 0 refills | Status: AC

## 2023-04-02 MED ORDER — ACETAMINOPHEN 325 MG PO TABS: 650.0000 mg | ORAL_TABLET | Freq: Four times a day (QID) | ORAL | 0 refills | Status: AC | PRN

## 2023-04-02 NOTE — Discharge Summary (Signed)
Physician Discharge Summary  ZENDE REICHARDT ZOX:[id number] DOB: 1941/10/19 DOA: 03/29/2023  PCP: [name], MD  Admit date: 03/29/2023 Discharge date: 04/02/2023  Admitted From: Home Disposition:  Home   Recommendations for Outpatient Follow-up:  Follow up with podiatrist in 1-2 weeks Please obtain BMP/CBC in one week Go home with home health PT as he refused skilled nursing facility  Home Health:Yes Equipment/Devices:DME wheelchair  Discharge Condition:Stable CODE STATUS:Full Diet recommendation: Heart Healthy   Brief/Interim Summary: 82 y.o. male past medical history significant for poorly controlled insulin-dependent diabetes mellitus, peripheral artery tibial disease, atrial flutter on Xarelto obstructive sleep apnea CPAP intolerant, severe mitral stenosis who has missed his follow-up appointments and is noncompliant with his medication.  Comes into the emergency room for nonhealing right foot ulcer. MRI of the right foot positive for osteomyelitis of the first metatarsal head no abscess with edema of the plantar muscles and tendon   Discharge Diagnoses:  Principal Problem:   Right foot infection Active Problems:   PAD (peripheral artery disease) (HCC)   OSA (obstructive sleep apnea)   Ulcer of right foot with necrosis of bone (HCC)   Atrial flutter (HCC)   Insulin-requiring or dependent type II diabetes mellitus (HCC)   Subacute osteomyelitis of right foot (HCC)  Osteomyelitis diabetic foot ulcer/right foot infection and right lower extremity cellulitis: Septic on admission started empirically on antibiotics. Podiatrist was consulted and status post transmetatarsal amputation of the right foot on 03/30/2023. He was transition to oral doxycycline and Augmentin which we will continue as an outpatient for 10 additional days. ABI showed resting ankle-brachial index compressible of the right lower extremity. He will resume his home dose of Xarelto, I have discussed with him  as he has been noncompliant with this and he understands and relates he will take it. PT evaluated the patient, he will need skilled nursing facility but he refused. I discussed at length with him the need to go to skilled nursing facility but he adamantly refused. I spoke with his wife and his wife is in agreement with his decision and they would both like for him to go home.  History of atrial flutter: Now in sinus rhythm he will resume his Xarelto as an outpatient. He has been noncompliant with his medication in the past, but after discussing with him he relates he will take it.  Hypokalemia: Replete orally now resolved.  Insulin-dependent diabetes mellitus type 2 without insulin use: A1c of 13, which point towards noncompliance. He was resumed on his metformin started on long-acting insulin and will continue with meal coverage. I have discussed with him we will DC the [facility]. His blood glucose remained well-controlled on this regimen of long-acting insulin 8 units of insulin with meals and metformin.  Peripheral arterial disease: Continue statins.  History of severe mitral stenosis: He relates being followed by cardiology as an outpatient.  He relates he would not want surgery for his mitral stenosis. I explained to him the risk and benefits of doing this. He relates he would like not to have surgery for this. Palliative care will follow up with him as an outpatient.  As he will need hospice referral at some point.   Discharge Instructions  Discharge Instructions     Diet - low sodium heart healthy   Complete by: As directed    Increase activity slowly   Complete by: As directed       Allergies as of 04/02/2023       Reactions  Bactrim [sulfamethoxazole-trimethoprim] Rash        Medication List     STOP taking these medications    Medihoney Wound/Burn Dressing Gel   tirzepatide 5 MG/0.5ML Pen Commonly known as: MOUNJARO       TAKE these medications     acetaminophen 325 MG tablet Commonly known as: TYLENOL Take 2 tablets (650 mg total) by mouth every 6 (six) hours as needed for mild pain (or Fever >/= 101).   amoxicillin-clavulanate 875-125 MG tablet Commonly known as: AUGMENTIN Take 1 tablet by mouth every 12 (twelve) hours for 10 days.   B-D UF III MINI PEN NEEDLES 31G X 5 MM Misc Generic drug: Insulin Pen Needle   Insulin Pen Needle 32G X 6 MM Misc 1 Act by Does not apply route in the morning and at bedtime.   cyanocobalamin 500 MCG tablet Commonly known as: VITAMIN B12 Take 500 mcg by mouth daily.   doxycycline 100 MG tablet Commonly known as: VIBRA-TABS Take 1 tablet (100 mg total) by mouth every 12 (twelve) hours for 10 days.   FreeStyle Libre 2 Reader [place] 1 Act by Does not apply route daily.   FreeStyle Libre 2 Sensor Misc 1 Act by Does not apply route daily.   Gvoke HypoPen 2-Pack 1 MG/0.2ML Soaj Generic drug: Glucagon Inject 1 Act into the skin daily as needed.   insulin aspart 100 UNIT/ML FlexPen Commonly known as: NOVOLOG Inject 8 Units into the skin 3 (three) times daily with meals.   metFORMIN 500 MG tablet Commonly known as: GLUCOPHAGE Take 2 tablets (1,000 mg total) by mouth 2 (two) times daily with a meal. What changed: how much to take   Nexlizet 180-10 MG Tabs Generic drug: Bempedoic Acid-Ezetimibe Take 1 tablet by mouth daily.   rivaroxaban 20 MG Tabs tablet Commonly known as: Xarelto Take 1 tablet (20 mg total) by mouth daily.   simvastatin 40 MG tablet Commonly known as: ZOCOR Take 1 tablet (40 mg total) by mouth daily at 6 PM.   thiamine 100 MG tablet Commonly known as: Vitamin B-1 Take 100 mg by mouth daily.   Toujeo Max SoloStar 300 UNIT/ML Solostar Pen Generic drug: insulin glargine (2 Unit Dial) Inject 50 Units into the skin daily. What changed: how much to take   Vitamin D (Ergocalciferol) 1.25 MG (50000 UNIT) Caps capsule Commonly known as: DRISDOL Take 50,000 Units by  mouth every Thursday.               Durable Medical Equipment  (From admission, onward)           Start     Ordered   04/02/23 0844  For home use only DME standard manual wheelchair with seat cushion  Once       Comments: Patient suffers from left 3 toes amputation which impairs their ability to perform daily activities like bathing, dressing, and feeding in the home.  A walker will not resolve issue with performing activities of daily living. A wheelchair will allow patient to safely perform daily activities. Patient can safely propel the wheelchair in the home or has a caregiver who can provide assistance. Length of need 6 months . Accessories: elevating leg rests (ELRs), wheel locks, extensions and anti-tippers.   04/02/23 0844   04/02/23 0842  For home use only DME 4 wheeled rolling walker with seat  Once       Question:  Patient needs a walker to treat with the following condition  Answer:  Non-healing  wound of left lower extremity   04/02/23 0844            Follow-up Information     [facility], [facility] Follow up.   Specialty: Home Health Services Why: Frances Furbish will provide PT and OT in the home after discharge. Contact information: [address] [place] [address] [phone]                Allergies  Allergen Reactions   Bactrim [Sulfamethoxazole-Trimethoprim] Rash    Consultations: Podiatry   Procedures/Studies: DG Foot 2 Views Right  Result Date: 03/30/2023 CLINICAL DATA:  [id number] Post-operative state 252351 EXAM: RIGHT FOOT - 2 VIEW COMPARISON:  None Available. FINDINGS: Postsurgical changes of transmetatarsal amputation. Surgical margins are smooth. Expected soft tissue swelling and gas. Soft tissue calcifications of the lower leg, unchanged. IMPRESSION: Postsurgical changes of transmetatarsal amputation. Electronically Signed   By: Caprice Renshaw M.D.   On: 03/30/2023 16:12   MR FOOT RIGHT W WO CONTRAST  Result Date:  03/29/2023 CLINICAL DATA:  Concern for right foot osteomyelitis. Soft tissue infection. EXAM: MRI OF THE RIGHT FOREFOOT WITHOUT AND WITH CONTRAST TECHNIQUE: Multiplanar, multisequence MR imaging of the right forefoot was performed before and after the administration of intravenous contrast. CONTRAST:  10mL GADAVIST GADOBUTROL 1 MMOL/ML IV SOLN COMPARISON:  Radiograph dated Mar 29, 2023 FINDINGS: Bones/Joint/Cartilage Postsurgical changes with prior amputation of the right foot through the metatarsophalangeal joints of the first and fifth digit and through metatarsal heads of the second through fourth digits. There is mild edema about the plantar aspect of the first metatarsal head adjacent to the deep skin wound about the plantar aspect of the foot concerning for osteomyelitis. Ligaments Lisfranc ligament is intact. Muscles and Tendons Edema of the plantar muscles and tendons suggesting diabetic myopathy/myositis. No fluid collection or abscess. Soft tissues Deep skin wound about the plantar aspect of the first metatarsal head. Mild generalized subcutaneous soft tissue edema about the dorsum of the foot. IMPRESSION: 1. Deep skin wound about the plantar aspect of the first metatarsal head with mild marrow edema about the plantar aspect of the first metatarsal head concerning for osteomyelitis. 2. No fluid collection or abscess. 3. Postsurgical changes with prior amputation of the right foot through the metatarsophalangeal joints of the first and fifth digit and through metatarsal heads of the second through fourth digits. 4. Edema of the plantar muscles and tendons suggesting diabetic myopathy/myositis. Electronically Signed   By: Larose Hires D.O.   On: 03/29/2023 20:56   VAS [place] ABI WITH/WO TBI  Result Date: 03/29/2023  LOWER EXTREMITY DOPPLER STUDY Patient Name:  MYAN VANELLA  Date of Exam:   03/29/2023 Medical Rec #: [id number]        Accession #:    [id number] Date of Birth: 1941-11-03         Patient Gender:  M Patient Age:   82 years Exam Location:  [facility] Procedure:      VAS [place] ABI WITH/WO TBI Referring Phys: DAVID YAO --------------------------------------------------------------------------------  Indications: Ulceration. right transmetatarsal amputation High Risk Factors: Hyperlipidemia, Diabetes.  Comparison Study: 11/18/2022 - Right: Resting right ankle-brachial index is within                   normal range.                    Left:  Resting left ankle-brachial index is elevated, suggestive of                   medial                   arterial calcification. Absent left great toe PPG waveform. Performing Technologist: Olen Cordial RVT  Examination Guidelines: A complete evaluation includes at minimum, Doppler waveform signals and systolic blood pressure reading at the level of bilateral brachial, anterior tibial, and posterior tibial arteries, when vessel segments are accessible. Bilateral testing is considered an integral part of a complete examination. Photoelectric Plethysmograph (PPG) waveforms and toe systolic pressure readings are included as required and additional duplex testing as needed. Limited examinations for reoccurring indications may be performed as noted.  ABI Findings: +---------+------------------+-----+-----------+----------+ Right    Rt Pressure (mmHg)IndexWaveform   Comment    +---------+------------------+-----+-----------+----------+ Brachial 108                    triphasic             +---------+------------------+-----+-----------+----------+ PTA      169               1.46 multiphasic           +---------+------------------+-----+-----------+----------+ DP       125               1.08 multiphasic           +---------+------------------+-----+-----------+----------+ Great Toe                                  Amputation +---------+------------------+-----+-----------+----------+  +---------+------------------+-----+-----------+-------+ Left     Lt Pressure (mmHg)IndexWaveform   Comment +---------+------------------+-----+-----------+-------+ Brachial 116                    triphasic          +---------+------------------+-----+-----------+-------+ PTA      175               1.51 multiphasic        +---------+------------------+-----+-----------+-------+ DP       189               1.63 multiphasic        +---------+------------------+-----+-----------+-------+ Great Toe51                0.44                    +---------+------------------+-----+-----------+-------+ +-------+-----------+-----------+------------+------------+ ABI/TBIToday's ABIToday's TBIPrevious ABIPrevious TBI +-------+-----------+-----------+------------+------------+ Right  1.46       Amputation 1.39        Amputation   +-------+-----------+-----------+------------+------------+ Left   1.63       0.44       1.53        1.12         +-------+-----------+-----------+------------+------------+  Summary: Right: Resting right ankle-brachial index indicates noncompressible right lower extremity arteries. Unable to obtain TBI due to great toe amputation. Left: Resting left ankle-brachial index indicates noncompressible left lower extremity arteries. The left toe-brachial index is abnormal. *See table(s) above for measurements and observations.  Electronically signed by Waverly Ferrari MD on 03/29/2023 at 6:11:45 PM.    Final    DG Foot Complete Right  Result Date: 03/29/2023 CLINICAL DATA:  Right foot ulcer.  Evaluate for osteomyelitis. EXAM: RIGHT FOOT COMPLETE - 3+ VIEW COMPARISON:  Right foot radiographs 03/29/2023, earlier same  day. Right foot radiographs 03/31/2021 FINDINGS: Redemonstration of amputation of the first through fifth toe phalanges and the distal aspect of the second through fourth metatarsals. Mild first through fifth tarsometatarsal joint space narrowing.  Moderate to high-grade dorsal talonavicular joint space narrowing and osteophytosis. Mild-to-moderate dorsal tarsometatarsal degenerative osteophytosis on lateral view. Moderate to large plantar and mild to moderate posterior calcaneal heel spurs. Diffuse soft tissue edema. Soft tissue air extending from the distal medial forefoot to the distal medial great toe metatarsal head. No cortical erosion is seen. IMPRESSION: 1. No significant change from radiographs performed earlier today. 2. Status post amputation of the first through fifth toe phalanges and distal aspect of the second through fourth metatarsals. 3. Soft tissue air at the distal medial aspect of the forefoot extending to the distal medial aspect of the great toe metatarsal head. No cortical erosion to indicate radiographic evidence of osteomyelitis. Electronically Signed   By: Neita Garnet M.D.   On: 03/29/2023 16:58   DG Foot Complete Right  Result Date: 03/29/2023 CLINICAL DATA:  Right foot pain for 3 weeks.  No injury. EXAM: RIGHT FOOT COMPLETE - 3+ VIEW COMPARISON:  03/31/2021. FINDINGS: Previous amputation of all toes, which was present on the prior study. No fracture. No bone lesion. No bone resorption to suggest osteomyelitis. Joints are normally spaced and aligned. Mild dorsal spurring at the cuneiform metatarsal articulation on the lateral view and talar navicular articulation. Moderate-sized plantar and dorsal calcaneal spurs. Diffuse soft tissue edema. There is soft tissue air extending from the distal medial foot to the medial aspect of the first metatarsal head. IMPRESSION: 1. No fracture or dislocation. 2. No evidence of osteomyelitis. 3. Soft tissue air extending anteriorly from the first metatarsal head. Diffuse soft tissue swelling. Electronically Signed   By: Amie Portland M.D.   On: 03/29/2023 14:45   (Echo, Carotid, EGD, Colonoscopy, ERCP)    Subjective: No complaints  Discharge Exam: Vitals:   04/01/23 2054 04/02/23  0538  BP: 115/62 110/62  Pulse: 71 64  Resp: 18 18  Temp: 99 F (37.2 C) 98.2 F (36.8 C)  SpO2: 98% 93%   Vitals:   04/01/23 0535 04/01/23 1405 04/01/23 2054 04/02/23 0538  BP: (!) 112/59 102/60 115/62 110/62  Pulse: 65 68 71 64  Resp: 16 18 18 18   Temp: 98.4 F (36.9 C) 97.8 F (36.6 C) 99 F (37.2 C) 98.2 F (36.8 C)  TempSrc:  Oral Oral Oral  SpO2: 96% 97% 98% 93%  Weight:      Height:        General: Pt is alert, awake, not in acute distress Cardiovascular: RRR, S1/S2 +, no rubs, no gallops Respiratory: CTA bilaterally, no wheezing, no rhonchi Abdominal: Soft, NT, ND, bowel sounds + Extremities: no edema, no cyanosis    The results of significant diagnostics from this hospitalization (including imaging, microbiology, ancillary and laboratory) are listed below for reference.     Microbiology: Recent Results (from the past 240 hour(s))  Surgical PCR screen     Status: Abnormal   Collection Time: 03/29/23 11:58 PM   Specimen: Nasal Mucosa; Nasal Swab  Result Value Ref Range Status   MRSA, PCR POSITIVE (A) NEGATIVE Final   Staphylococcus aureus POSITIVE (A) NEGATIVE Final    Comment: (NOTE) The Xpert SA Assay (FDA approved for NASAL specimens in patients 2 years of age and older), is one component of a comprehensive surveillance program. It is not intended to diagnose infection nor to guide or  monitor treatment. Performed at [facility], [address] [place], [place] [address]   Aerobic/Anaerobic Culture w Gram Stain (surgical/deep wound)     Status: None (Preliminary result)   Collection Time: 03/30/23  2:53 PM   Specimen: PATH Bone resection; Tissue  Result Value Ref Range Status   Specimen Description   Final    BONE RIGHT FOOT Performed at [facility], [address] [place], [place] [address]    Special Requests   Final    NONE Performed at [facility], [address],  [place], [place] [address]    Gram Stain NO WBC SEEN NO ORGANISMS SEEN   Final   Culture   Final    NO GROWTH 2 DAYS NO ANAEROBES ISOLATED; CULTURE IN PROGRESS FOR 5 DAYS Performed at [facility], [address] [place], [place] [address]    Report Status PENDING  Incomplete     Labs: BNP (last 3 results) No results for input(s): "BNP" in the last 8760 hours. Basic Metabolic Panel: Recent Labs  Lab 03/29/23 1600 03/30/23 0341 03/31/23 0344 04/01/23 0343 04/02/23 0322  NA 130* 134* 127* 135 132*  K 4.3 3.6 4.1 3.4* 4.2  CL 96* 100 97* 101 102  CO2 26 26 22 26 23   GLUCOSE 475* 150* 288* 108* 206*  BUN 15 13 18  27* 27*  CREATININE 1.19 0.93 0.94 0.97 0.91  CALCIUM 8.9 8.7* 8.4* 8.5* 8.3*   Liver Function Tests: Recent Labs  Lab 03/29/23 1412 03/29/23 1600  AST 12 13*  ALT 12 15  ALKPHOS 61 57  BILITOT 0.9 1.2  PROT 7.7 7.9  ALBUMIN 3.9 3.6   No results for input(s): "LIPASE", "AMYLASE" in the last 168 hours. No results for input(s): "AMMONIA" in the last 168 hours. CBC: Recent Labs  Lab 03/29/23 1412 03/29/23 1600 03/30/23 0341 03/31/23 0344 04/01/23 0343  WBC 5.3 4.9 4.4 8.4 8.4  NEUTROABS 3.7  --   --   --   --   HGB 14.4 13.8 13.0 13.3 12.2*  HCT 42.9 41.7 39.5 40.1 36.5*  MCV 89.7 90.1 90.2 90.9 90.6  PLT 220.0 207 191 206 201   Cardiac Enzymes: No results for input(s): "CKTOTAL", "CKMB", "CKMBINDEX", "TROPONINI" in the last 168 hours. BNP: Invalid input(s): "POCBNP" CBG: Recent Labs  Lab 04/01/23 1044 04/01/23 1243 04/01/23 1647 04/01/23 2055 04/02/23 0726  GLUCAP 197* 127* 134* 123* 130*   D-Dimer No results for input(s): "DDIMER" in the last 72 hours. Hgb A1c No results for input(s): "HGBA1C" in the last 72 hours. Lipid Profile No results for input(s): "CHOL", "HDL", "LDLCALC", "TRIG", "CHOLHDL", "LDLDIRECT" in the last 72 hours. Thyroid function studies No results for input(s): "TSH", "T4TOTAL", "T3FREE", "THYROIDAB" in the last  72 hours.  Invalid input(s): "FREET3" Anemia work up No results for input(s): "VITAMINB12", "FOLATE", "FERRITIN", "TIBC", "IRON", "RETICCTPCT" in the last 72 hours. Urinalysis    Component Value Date/Time   COLORURINE YELLOW 03/29/2023 1412   APPEARANCEUR CLEAR 03/29/2023 1412   LABSPEC 1.015 03/29/2023 1412   PHURINE 6.0 03/29/2023 1412   GLUCOSEU >=1000 (A) 03/29/2023 1412   HGBUR NEGATIVE 03/29/2023 1412   BILIRUBINUR NEGATIVE 03/29/2023 1412   BILIRUBINUR neg 03/21/2018 0940   KETONESUR NEGATIVE 03/29/2023 1412   PROTEINUR NEGATIVE 05/21/2020 1458   UROBILINOGEN 0.2 03/29/2023 1412   NITRITE NEGATIVE 03/29/2023 1412   LEUKOCYTESUR NEGATIVE 03/29/2023 1412   Sepsis Labs Recent Labs  Lab 03/29/23 1600 03/30/23  1610 03/31/23 0344 04/01/23 0343  WBC 4.9 4.4 8.4 8.4   Microbiology Recent Results (from the past 240 hour(s))  Surgical PCR screen     Status: Abnormal   Collection Time: 03/29/23 11:58 PM   Specimen: Nasal Mucosa; Nasal Swab  Result Value Ref Range Status   MRSA, PCR POSITIVE (A) NEGATIVE Final   Staphylococcus aureus POSITIVE (A) NEGATIVE Final    Comment: (NOTE) The Xpert SA Assay (FDA approved for NASAL specimens in patients 9 years of age and older), is one component of a comprehensive surveillance program. It is not intended to diagnose infection nor to guide or monitor treatment. Performed at [facility], [address] [place], [place] [address]   Aerobic/Anaerobic Culture w Gram Stain (surgical/deep wound)     Status: None (Preliminary result)   Collection Time: 03/30/23  2:53 PM   Specimen: PATH Bone resection; Tissue  Result Value Ref Range Status   Specimen Description   Final    BONE RIGHT FOOT Performed at [facility], [address] [place], [place] [address]    Special Requests   Final    NONE Performed at [facility], [address], [place], [place] [address]    Gram  Stain NO WBC SEEN NO ORGANISMS SEEN   Final   Culture   Final    NO GROWTH 2 DAYS NO ANAEROBES ISOLATED; CULTURE IN PROGRESS FOR 5 DAYS Performed at [facility], [address], [place], [place] [address]    Report Status PENDING  Incomplete    SIGNED:   Marinda Elk, MD  [facility] 04/02/2023, 8:48 AM Pager   If 7PM-7AM, please contact night-coverage [email or web address] Password [email or web address]

## 2023-04-02 NOTE — Plan of Care (Signed)
  Problem: Education: Goal: Knowledge of General Education information will improve Description: Including pain rating scale, medication(s)/side effects and non-pharmacologic comfort measures Outcome: Progressing   Problem: Pain Managment: Goal: General experience of comfort will improve Outcome: Progressing   Problem: Safety: Goal: Ability to remain free from injury will improve Outcome: Progressing   

## 2023-04-02 NOTE — TOC Transition Note (Signed)
Transition of Care [facility]) - CM/SW Discharge Note  Patient Details  Name: Jason [name] MRN: [id number] Date of Birth: 1941/06/04  [facility] CM/SW Contact:  [name], LCSW Phone Number: 04/02/2023, 12:20 PM  Clinical Narrative: Patient is medically ready for discharge. HH orders have been placed. Patient will need a wheelchair due to needing to limit weightbearing. Patient agreeable to having wheelchair delivered to his home as [facility] will not transport DME. Wheelchair narrative completed. CSW made DME referral to Jasmin with [employer]. [employer] to deliver wheelchair to patient's home. Medical necessity form done. [facility] scheduled. TOC signing off.  Final next level of care: Home w Home Health Services Barriers to Discharge: Barriers Resolved  Patient Goals and CMS Choice CMS Medicare.gov Compare Post Acute Care list provided to:: Patient Choice offered to / list presented to : Patient  Discharge Plan and Services Additional resources added to the After Visit Summary for   In-house Referral: Clinical Social Work Post Acute Care Choice: Home Health          DME Arranged: Wheelchair manual DME Agency: [employer] Date DME Agency Contacted: 04/02/23 Time DME Agency Contacted: 1042 Representative spoke with at DME Agency: Jasmin HH Arranged: PT, OT HH Agency: [facility] Date [facility] Agency Contacted: 04/01/23 Time HH Agency Contacted: 1434 Representative spoke with at [facility] Agency: Cindie  Social Determinants of Health (SDOH) Interventions SDOH Screenings   Food Insecurity: No Food Insecurity (03/29/2023)  Housing: Low Risk  (03/29/2023)  Transportation Needs: No Transportation Needs (03/29/2023)  Utilities: Not At Risk (03/29/2023)  Alcohol Screen: Low Risk  (06/01/2022)  Depression (PHQ2-9): Low Risk  (06/01/2022)  Financial Resource Strain: Low Risk  (06/01/2022)  Physical Activity: Sufficiently Active (06/01/2022)  Social Connections: Socially Integrated (06/01/2022)   Stress: Stress Concern Present (06/01/2022)  Tobacco Use: Medium Risk (03/31/2023)   Readmission Risk Interventions     No data to display

## 2023-04-02 NOTE — Care Management (Signed)
Patient suffers from transmetatarsal amputation of R LE and limited weight bearing following surgery, which impairs their ability to perform daily activities like toileting, dressing, and bathing the home. A rolling walker will not resolve issue with performing activities of daily living. A wheelchair will allow patient to safely perform daily activities. Patient can safely propel the wheelchair in the home or has a caregiver who can provide assistance.     Durable Medical Equipment  (From admission, onward)           Start     Ordered   04/02/23 0844  For home use only DME standard manual wheelchair with seat cushion  Once       Comments: Patient suffers from left 3 toes amputation which impairs their ability to perform daily activities like bathing, dressing, and feeding in the home.  A walker will not resolve issue with performing activities of daily living. A wheelchair will allow patient to safely perform daily activities. Patient can safely propel the wheelchair in the home or has a caregiver who can provide assistance. Length of need 6 months . Accessories: elevating leg rests (ELRs), wheel locks, extensions and anti-tippers.   04/02/23 0844   04/02/23 0842  For home use only DME 4 wheeled rolling walker with seat  Once       Question:  Patient needs a walker to treat with the following condition  Answer:  Non-healing wound of left lower extremity   04/02/23 0844

## 2023-04-04 ENCOUNTER — Telehealth: Payer: Self-pay | Admitting: Internal Medicine

## 2023-04-04 ENCOUNTER — Telehealth: Payer: Self-pay | Admitting: Podiatry

## 2023-04-04 LAB — AEROBIC/ANAEROBIC CULTURE W GRAM STAIN (SURGICAL/DEEP WOUND)

## 2023-04-04 NOTE — Telephone Encounter (Signed)
Jason [name], physical therapy with [facility], called. Concerns regarding postop. Trying to get into a SNF. Please call. [phone]

## 2023-04-04 NOTE — Telephone Encounter (Signed)
Rosanne Ashing with [facility] called states patient was hospitalized and had a partial foot amputation, has since been released and was seen today. States patient needs to be in a rehab center, he refused while at the hospital but has since agreed. He has no way to check his blood sugar and it runs about 300-400 when he is able to check it. He is missing a number of medications including some antibiotics. Patient is showing signs of dementia but there has been no formal diagnosis. Only caregiver is the wife and she has a number of medical issues herself. A good callback number for Rosanne Ashing is [phone].

## 2023-04-05 ENCOUNTER — Other Ambulatory Visit: Payer: Self-pay | Admitting: Internal Medicine

## 2023-04-05 DIAGNOSIS — Z794 Long term (current) use of insulin: Secondary | ICD-10-CM

## 2023-04-05 DIAGNOSIS — E1151 Type 2 diabetes mellitus with diabetic peripheral angiopathy without gangrene: Secondary | ICD-10-CM

## 2023-04-05 LAB — SURGICAL PATHOLOGY

## 2023-04-06 ENCOUNTER — Telehealth: Payer: Self-pay | Admitting: Cardiology

## 2023-04-06 ENCOUNTER — Telehealth: Payer: Self-pay | Admitting: *Deleted

## 2023-04-06 ENCOUNTER — Telehealth: Payer: Self-pay | Admitting: Internal Medicine

## 2023-04-06 DIAGNOSIS — E785 Hyperlipidemia, unspecified: Secondary | ICD-10-CM

## 2023-04-06 DIAGNOSIS — I739 Peripheral vascular disease, unspecified: Secondary | ICD-10-CM

## 2023-04-06 NOTE — Telephone Encounter (Signed)
Rosanne Ashing PT [facility]) called about possible interaction with Nexlizet and Simvastatin   Routed to pharmacy team to review

## 2023-04-06 NOTE — Telephone Encounter (Signed)
Yes there is an interaction - simvastatin dose needs to be capped at 20mg  daily when used in combination with Nexlizet. Or can change to another statin that doesn't interact with Nexlizet at all (like rosuvastatin). His LDL was elevated a few weeks ago, would recommend replacing simvastatin with rosuvastatin 40mg  daily and continuing Nexlizet. This would avoid the drug interaction and provide more effective LDL lowering.

## 2023-04-06 NOTE — Telephone Encounter (Signed)
Rosanne Ashing with [facility] called to report a medication interaction between  Bempedoic Acid-Ezetimibe (NEXLIZET) 180-10 MG TABS and  simvastatin (ZOCOR) 40 MG tablet - he requests call back to confirm that patient should or should not be taking both of these medications. [phone]

## 2023-04-06 NOTE — Telephone Encounter (Signed)
error 

## 2023-04-06 NOTE — Telephone Encounter (Signed)
Left message for patient to call back to discuss this.   Routed to primary cardiologist and nurse also

## 2023-04-06 NOTE — Telephone Encounter (Signed)
Pt c/o medication issue:  1. Name of Medication:   Bempedoic Acid-Ezetimibe (NEXLIZET) 180-10 MG TABS  simvastatin (ZOCOR) 40 MG tablet   2. How are you currently taking this medication (dosage and times per day)?   3. Are you having a reaction (difficulty breathing--STAT)?   4. What is your medication issue?    Caller stated based on the discharge summary from patient's hospital visit there is a Level 1 interaction with Bempedoic Acid-Ezetimibe (NEXLIZET) 180-10 MG TABS and simvastatin (ZOCOR) 40 MG tablet.  Caller wants call back with next steps.

## 2023-04-07 ENCOUNTER — Telehealth: Payer: Self-pay | Admitting: Podiatry

## 2023-04-07 ENCOUNTER — Other Ambulatory Visit (INDEPENDENT_AMBULATORY_CARE_PROVIDER_SITE_OTHER): Payer: Medicare PPO | Admitting: Podiatry

## 2023-04-07 DIAGNOSIS — Z89431 Acquired absence of right foot: Secondary | ICD-10-CM

## 2023-04-07 NOTE — Telephone Encounter (Signed)
John from [facility], occupational therapist, is calling asking for a Rx for a bedside commode to [facility] due to difficulty maintain NWB. Please send Rx  [phone]- John phone number Fax for [facility] [phone]  Message left on nurse line.

## 2023-04-07 NOTE — Telephone Encounter (Signed)
Andres Shad, OT, from [facility] called, requesting a verbal order clarification for this patient.    He wants to see if you'll approve 4 total occupational therapy visits for functional mobility, diabetic foot instructions and care, and exercise.  His number is [phone] and he said it was okay to leave a voicemail on his secure line.  -Andreya Lacks

## 2023-04-07 NOTE — Telephone Encounter (Signed)
[name], MD  Lindell Spar, RN Caller: Unspecified Jason [name],  2:38 PM) Agree change to Crestor 40 mg daily and repeat lipid in 3 months.  Call Jason [name] with the results and send results to [name], MD

## 2023-04-07 NOTE — Progress Notes (Signed)
Rx for bedsid commode

## 2023-04-08 MED ORDER — ROSUVASTATIN CALCIUM 40 MG PO TABS
40.0000 mg | ORAL_TABLET | Freq: Every day | ORAL | 3 refills | Status: DC
Start: 2023-04-08 — End: 2024-01-29

## 2023-04-08 NOTE — Telephone Encounter (Signed)
Spoke with pt, aware of the interaction and to stop simvastatin.  New script sent to the pharmacy  Lab orders mailed to the pt

## 2023-04-12 ENCOUNTER — Telehealth: Payer: Self-pay | Admitting: *Deleted

## 2023-04-12 NOTE — Telephone Encounter (Signed)
Jason [name] is calling to ask that the order for the bedside commode be sent to [facility].I explained that  the order was sent to  [facility] and did explained to( OT). He will reach out to them to make sure they had received since they were closed on  Friday.

## 2023-04-13 ENCOUNTER — Telehealth: Payer: Self-pay | Admitting: Internal Medicine

## 2023-04-13 ENCOUNTER — Telehealth: Payer: Self-pay | Admitting: Podiatry

## 2023-04-13 DIAGNOSIS — E1151 Type 2 diabetes mellitus with diabetic peripheral angiopathy without gangrene: Secondary | ICD-10-CM

## 2023-04-13 DIAGNOSIS — E118 Type 2 diabetes mellitus with unspecified complications: Secondary | ICD-10-CM

## 2023-04-13 MED ORDER — FREESTYLE LIBRE 2 SENSOR MISC
1.0000 | Freq: Every day | 5 refills | Status: AC
Start: 2023-04-13 — End: ?

## 2023-04-13 MED ORDER — FREESTYLE LIBRE 2 READER DEVI
1.0000 | Freq: Every day | 5 refills | Status: DC
Start: 2023-04-13 — End: 2023-08-25

## 2023-04-13 NOTE — Telephone Encounter (Signed)
Patient has missplaced his free style libre 2 monitor - please send in a script for a new one to [facility] on [address].  [facility] Nurse would like to follow patient once a week for 5 weeks.  Please advise  Phone:  [phone]

## 2023-04-13 NOTE — Telephone Encounter (Signed)
April with [facility] called  and left VM on RN line stating she would like orders for his surgical incision. He has an appointment on June 7th. She wanted to see if she can remove the bandage to look at the incision  Her number is [phone]  Please send orders

## 2023-04-13 NOTE — Telephone Encounter (Signed)
Libre reader and sensors have been sent to the [facility] as requested.

## 2023-04-14 ENCOUNTER — Telehealth: Payer: Self-pay | Admitting: Internal Medicine

## 2023-04-14 NOTE — Telephone Encounter (Signed)
Debbie, a Child psychotherapist from [facility] called and said they would like to set up a peer to peer appointment with [name]. They would need to know by 11 am on 04/15/2023. To set that up, please call [phone]. If there are any questions or he is unable to do the appointment, please call Debbie at [phone].

## 2023-04-21 ENCOUNTER — Telehealth: Payer: Self-pay | Admitting: Internal Medicine

## 2023-04-21 ENCOUNTER — Telehealth: Payer: Self-pay | Admitting: Podiatry

## 2023-04-21 MED ORDER — NEXLIZET 180-10 MG PO TABS
1.0000 | ORAL_TABLET | Freq: Every day | ORAL | 0 refills | Status: DC
Start: 2023-04-21 — End: 2024-01-29

## 2023-04-21 NOTE — Telephone Encounter (Signed)
Jason [name] from [facility] was the pt today, and the pt report mild constipation.   Pt has taken miralax for the issue before, and wants to know if it's ok to use it again.   Please call pt's wife, Jason [name] and Jason [name] to confirm.  Jason [name] [phone]  Jason [name] - [phone]

## 2023-04-21 NOTE — Addendum Note (Signed)
Addended by: Derenda Fennel on: 04/21/2023 10:25 AM   Modules accepted: Orders

## 2023-04-21 NOTE — Telephone Encounter (Signed)
Informed by RN home health called today stating that he recently saw this patient at his home for rehab.  States that he is supposed to be nonweightbearing with the right lower extremity.  The therapist stated that due to his dementia and other cognitive issues, he has been full weightbearing on the affected foot.  He noted that initially it was attempted to get the patient set up with facility rehab, but they were informed that this was denied by the insurance company.  He has an upcoming appointment with [place].  No follow-up necessary therapist just wanted to bring you up-to-date.

## 2023-04-21 NOTE — Telephone Encounter (Signed)
Pt's wife, Jason [name], has been informed that it is ok for pt to take Miralax for constipation.

## 2023-04-21 NOTE — Telephone Encounter (Signed)
*  STAT* If patient is at the pharmacy, call can be transferred to refill team.   1. Which medications need to be refilled? (please list name of each medication and dose if known)   Bempedoic Acid-Ezetimibe (NEXLIZET) 180-10 MG TABS   2. Which pharmacy/location (including street and city if local pharmacy) is medication to be sent to?  [id number] - [place], [place] - [address] AT [address]   3. Do they need a 30 day or 90 day supply?   30 day  Caller stated patient is completely out of this medication and never started on this medication.  Caller stated patient wants initial prescription to go to [facility] and future prescriptions to go to [facility] - [place], [place] - [address]

## 2023-04-21 NOTE — Telephone Encounter (Signed)
Medication sent to desired pharmacy and patients wife was very satisfied

## 2023-04-22 ENCOUNTER — Ambulatory Visit (INDEPENDENT_AMBULATORY_CARE_PROVIDER_SITE_OTHER): Payer: Medicare HMO | Admitting: Podiatry

## 2023-04-22 DIAGNOSIS — L97411 Non-pressure chronic ulcer of right heel and midfoot limited to breakdown of skin: Secondary | ICD-10-CM

## 2023-04-22 DIAGNOSIS — I739 Peripheral vascular disease, unspecified: Secondary | ICD-10-CM

## 2023-04-22 DIAGNOSIS — E11621 Type 2 diabetes mellitus with foot ulcer: Secondary | ICD-10-CM

## 2023-04-22 DIAGNOSIS — Z89431 Acquired absence of right foot: Secondary | ICD-10-CM

## 2023-04-22 MED ORDER — DOXYCYCLINE HYCLATE 100 MG PO TABS: 100.0000 mg | ORAL_TABLET | Freq: Two times a day (BID) | ORAL | 0 refills | Status: DC

## 2023-04-22 MED ORDER — AMOXICILLIN-POT CLAVULANATE 875-125 MG PO TABS: 1.0000 | ORAL_TABLET | Freq: Two times a day (BID) | ORAL | 0 refills | Status: DC

## 2023-04-22 NOTE — Progress Notes (Signed)
  Subjective:  Patient ID: Jason [name], male    DOB: 1941-10-27,  MRN: [id number]  DOS: 03/30/2023 Procedure: Transmetatarsal amputation right foot  82 y.o. male returns for post-op check.  Patient is approximately 3.5 weeks from surgery.  Patient reports for first postoperative check after transmetatarsal amputation of the right foot.  Is unclear why he was not seen in the office sooner though believe there is issue with transportation as well as I was away this past week.  He says the dressing is only been changed 2 times since the hospital.  He does have [place] home health who is coming out and doing dressing changes twice a week.  They started earlier this week.  Review of Systems: Negative except as noted in the HPI. Denies N/V/F/Ch.   Objective:  There were no vitals filed for this visit. There is no height or weight on file to calculate BMI. Constitutional Well developed. Well nourished.  Vascular Foot warm and well perfused. Capillary refill normal to all digits.  Calf is soft and supple, no posterior calf or knee pain, negative Homans' sign  Neurologic Normal speech. Oriented to person, place, and time. Epicritic sensation to light touch grossly present bilaterally.  Dermatologic Maceration is noted at the transmetatarsal amputation site on the right foot however the suture line is intact with staples intact.  There is noted to be breakdown at the central aspect there is mild erythema and edema at this area.  No purulent drainage able to be expressed however there is serous drainage present.   Orthopedic: Tenderness to palpation noted about the surgical site.   Multiple view plain film radiographs: Deferred Assessment:   1. S/P transmetatarsal amputation of foot, right (HCC)   2. Diabetic ulcer of right midfoot associated with type 2 diabetes mellitus, limited to breakdown of skin (HCC)   3. PAD (peripheral artery disease) (HCC)    Plan:  Patient was evaluated and treated  and all questions answered.  S/p foot surgery right transmetatarsal amputation -Progressing with concern for possible postoperative infection at the surgical site -Will place patient on doxycycline and Augmentin.  E Rx for doxycycline 100 mg twice daily for 14 days.  eRx for Augmentin 875-125 mg twice daily for 14 days. -XR: Deferred at this visit -WB Status: Nonweightbearing in cam boot -patient has not been able to effectively maintain nonweightbearing status per my further prior recommendations.  He was initially recommended for skilled nursing facility at discharge from the hospital however he refused this and wanted to go home I believe this may have placed him at a disadvantage from a healing perspective in addition to the fact that the dressing was not changed on appropriate timeframe. -Sutures: Staples to remain intact at this time. -Medications: Antibiotics as above.  Patient does not have any pain associated with this area -Foot redressed with Betadine wet-to-dry dressing. -Recommend patient begin daily Betadine wet-to-dry dressings and explained and showed him how to do this.  Home health will assist on the days later there.  Return in about 2 weeks (around 05/06/2023) for Follow-up right transmetatarsal amputation.         Corinna Gab, DPM [facility] / [facility]

## 2023-04-25 ENCOUNTER — Telehealth: Payer: Self-pay | Admitting: Podiatry

## 2023-04-25 NOTE — Telephone Encounter (Signed)
Received vm today at 212pm from Rhunette Croft from [facility] and he would like a call back. He states pts wife told him that we restarted his doxycycline and he has taken 4 pills and has 4 left. Also needs to know about the augmentin  is it to be taken as well. He is wanting doasges of the medications so they can document in there chart.

## 2023-04-26 ENCOUNTER — Inpatient Hospital Stay (HOSPITAL_COMMUNITY)
Admission: EM | Admit: 2023-04-26 | Discharge: 2023-05-10 | DRG: 264 | Disposition: A | Discharge status: Skilled Nursing Facility | Payer: Medicare HMO | Source: Ambulatory Visit | Attending: Internal Medicine | Admitting: Internal Medicine

## 2023-04-26 ENCOUNTER — Other Ambulatory Visit: Payer: Self-pay

## 2023-04-26 ENCOUNTER — Emergency Department (HOSPITAL_COMMUNITY): Payer: Medicare HMO

## 2023-04-26 ENCOUNTER — Inpatient Hospital Stay (HOSPITAL_COMMUNITY): Payer: Medicare HMO

## 2023-04-26 ENCOUNTER — Encounter (HOSPITAL_COMMUNITY): Payer: Self-pay

## 2023-04-26 DIAGNOSIS — Z66 Do not resuscitate: Secondary | ICD-10-CM | POA: Diagnosis not present

## 2023-04-26 DIAGNOSIS — Z87891 Personal history of nicotine dependence: Secondary | ICD-10-CM

## 2023-04-26 DIAGNOSIS — Z515 Encounter for palliative care: Secondary | ICD-10-CM

## 2023-04-26 DIAGNOSIS — Z89431 Acquired absence of right foot: Secondary | ICD-10-CM | POA: Diagnosis not present

## 2023-04-26 DIAGNOSIS — I5033 Acute on chronic diastolic (congestive) heart failure: Principal | ICD-10-CM | POA: Diagnosis present

## 2023-04-26 DIAGNOSIS — R413 Other amnesia: Secondary | ICD-10-CM | POA: Diagnosis not present

## 2023-04-26 DIAGNOSIS — Z8042 Family history of malignant neoplasm of prostate: Secondary | ICD-10-CM

## 2023-04-26 DIAGNOSIS — D539 Nutritional anemia, unspecified: Secondary | ICD-10-CM | POA: Diagnosis present

## 2023-04-26 DIAGNOSIS — D7589 Other specified diseases of blood and blood-forming organs: Secondary | ICD-10-CM | POA: Diagnosis present

## 2023-04-26 DIAGNOSIS — I052 Rheumatic mitral stenosis with insufficiency: Secondary | ICD-10-CM | POA: Diagnosis present

## 2023-04-26 DIAGNOSIS — D649 Anemia, unspecified: Secondary | ICD-10-CM | POA: Diagnosis present

## 2023-04-26 DIAGNOSIS — Z91148 Patient's other noncompliance with medication regimen for other reason: Secondary | ICD-10-CM

## 2023-04-26 DIAGNOSIS — Z86010 Personal history of colonic polyps: Secondary | ICD-10-CM

## 2023-04-26 DIAGNOSIS — N400 Enlarged prostate without lower urinary tract symptoms: Secondary | ICD-10-CM | POA: Diagnosis present

## 2023-04-26 DIAGNOSIS — J441 Chronic obstructive pulmonary disease with (acute) exacerbation: Secondary | ICD-10-CM | POA: Diagnosis present

## 2023-04-26 DIAGNOSIS — J9601 Acute respiratory failure with hypoxia: Secondary | ICD-10-CM | POA: Diagnosis present

## 2023-04-26 DIAGNOSIS — J449 Chronic obstructive pulmonary disease, unspecified: Secondary | ICD-10-CM | POA: Diagnosis present

## 2023-04-26 DIAGNOSIS — Z7901 Long term (current) use of anticoagulants: Secondary | ICD-10-CM

## 2023-04-26 DIAGNOSIS — Z7189 Other specified counseling: Secondary | ICD-10-CM | POA: Diagnosis not present

## 2023-04-26 DIAGNOSIS — R4189 Other symptoms and signs involving cognitive functions and awareness: Secondary | ICD-10-CM | POA: Diagnosis not present

## 2023-04-26 DIAGNOSIS — I058 Other rheumatic mitral valve diseases: Secondary | ICD-10-CM | POA: Diagnosis present

## 2023-04-26 DIAGNOSIS — T8781 Dehiscence of amputation stump: Secondary | ICD-10-CM | POA: Diagnosis present

## 2023-04-26 DIAGNOSIS — Z881 Allergy status to other antibiotic agents status: Secondary | ICD-10-CM

## 2023-04-26 DIAGNOSIS — E871 Hypo-osmolality and hyponatremia: Secondary | ICD-10-CM | POA: Diagnosis present

## 2023-04-26 DIAGNOSIS — R0602 Shortness of breath: Secondary | ICD-10-CM

## 2023-04-26 DIAGNOSIS — E1151 Type 2 diabetes mellitus with diabetic peripheral angiopathy without gangrene: Secondary | ICD-10-CM | POA: Diagnosis present

## 2023-04-26 DIAGNOSIS — R791 Abnormal coagulation profile: Secondary | ICD-10-CM | POA: Diagnosis not present

## 2023-04-26 DIAGNOSIS — E11649 Type 2 diabetes mellitus with hypoglycemia without coma: Secondary | ICD-10-CM | POA: Diagnosis not present

## 2023-04-26 DIAGNOSIS — I70209 Unspecified atherosclerosis of native arteries of extremities, unspecified extremity: Secondary | ICD-10-CM | POA: Diagnosis not present

## 2023-04-26 DIAGNOSIS — Z833 Family history of diabetes mellitus: Secondary | ICD-10-CM

## 2023-04-26 DIAGNOSIS — G4733 Obstructive sleep apnea (adult) (pediatric): Secondary | ICD-10-CM | POA: Diagnosis present

## 2023-04-26 DIAGNOSIS — J42 Unspecified chronic bronchitis: Secondary | ICD-10-CM | POA: Diagnosis not present

## 2023-04-26 DIAGNOSIS — I482 Chronic atrial fibrillation, unspecified: Secondary | ICD-10-CM | POA: Diagnosis not present

## 2023-04-26 DIAGNOSIS — Z91199 Patient's noncompliance with other medical treatment and regimen due to unspecified reason: Secondary | ICD-10-CM

## 2023-04-26 DIAGNOSIS — F32A Depression, unspecified: Secondary | ICD-10-CM | POA: Diagnosis present

## 2023-04-26 DIAGNOSIS — I480 Paroxysmal atrial fibrillation: Secondary | ICD-10-CM | POA: Diagnosis present

## 2023-04-26 DIAGNOSIS — I340 Nonrheumatic mitral (valve) insufficiency: Secondary | ICD-10-CM | POA: Diagnosis not present

## 2023-04-26 DIAGNOSIS — E785 Hyperlipidemia, unspecified: Secondary | ICD-10-CM | POA: Diagnosis present

## 2023-04-26 DIAGNOSIS — F0390 Unspecified dementia without behavioral disturbance: Secondary | ICD-10-CM | POA: Diagnosis present

## 2023-04-26 DIAGNOSIS — Z79899 Other long term (current) drug therapy: Secondary | ICD-10-CM

## 2023-04-26 DIAGNOSIS — Z794 Long term (current) use of insulin: Secondary | ICD-10-CM

## 2023-04-26 DIAGNOSIS — Z8551 Personal history of malignant neoplasm of bladder: Secondary | ICD-10-CM

## 2023-04-26 DIAGNOSIS — I440 Atrioventricular block, first degree: Secondary | ICD-10-CM | POA: Diagnosis not present

## 2023-04-26 DIAGNOSIS — Z1152 Encounter for screening for COVID-19: Secondary | ICD-10-CM | POA: Diagnosis not present

## 2023-04-26 DIAGNOSIS — I342 Nonrheumatic mitral (valve) stenosis: Secondary | ICD-10-CM | POA: Diagnosis not present

## 2023-04-26 DIAGNOSIS — I050 Rheumatic mitral stenosis: Secondary | ICD-10-CM

## 2023-04-26 DIAGNOSIS — Y835 Amputation of limb(s) as the cause of abnormal reaction of the patient, or of later complication, without mention of misadventure at the time of the procedure: Secondary | ICD-10-CM | POA: Diagnosis present

## 2023-04-26 DIAGNOSIS — I5031 Acute diastolic (congestive) heart failure: Secondary | ICD-10-CM | POA: Diagnosis present

## 2023-04-26 DIAGNOSIS — I4892 Unspecified atrial flutter: Secondary | ICD-10-CM | POA: Diagnosis present

## 2023-04-26 DIAGNOSIS — E669 Obesity, unspecified: Secondary | ICD-10-CM | POA: Diagnosis present

## 2023-04-26 DIAGNOSIS — E119 Type 2 diabetes mellitus without complications: Secondary | ICD-10-CM

## 2023-04-26 DIAGNOSIS — I5082 Biventricular heart failure: Secondary | ICD-10-CM | POA: Diagnosis present

## 2023-04-26 DIAGNOSIS — I509 Heart failure, unspecified: Secondary | ICD-10-CM

## 2023-04-26 DIAGNOSIS — Z7984 Long term (current) use of oral hypoglycemic drugs: Secondary | ICD-10-CM

## 2023-04-26 DIAGNOSIS — F419 Anxiety disorder, unspecified: Secondary | ICD-10-CM | POA: Diagnosis present

## 2023-04-26 DIAGNOSIS — Z9049 Acquired absence of other specified parts of digestive tract: Secondary | ICD-10-CM

## 2023-04-26 DIAGNOSIS — I700 Atherosclerosis of aorta: Secondary | ICD-10-CM | POA: Diagnosis present

## 2023-04-26 DIAGNOSIS — Z6832 Body mass index (BMI) 32.0-32.9, adult: Secondary | ICD-10-CM

## 2023-04-26 DIAGNOSIS — Z823 Family history of stroke: Secondary | ICD-10-CM

## 2023-04-26 DIAGNOSIS — J9811 Atelectasis: Secondary | ICD-10-CM | POA: Diagnosis present

## 2023-04-26 DIAGNOSIS — I483 Typical atrial flutter: Secondary | ICD-10-CM | POA: Diagnosis not present

## 2023-04-26 LAB — RESP PANEL BY RT-PCR (RSV, FLU A&B, COVID)  RVPGX2
Influenza A by PCR: NEGATIVE
Influenza B by PCR: NEGATIVE
Resp Syncytial Virus by PCR: NEGATIVE
SARS Coronavirus 2 by RT PCR: NEGATIVE

## 2023-04-26 LAB — CBC
HCT: 31.2 % — ABNORMAL LOW (ref 39.0–52.0)
Hemoglobin: 9.4 g/dL — ABNORMAL LOW (ref 13.0–17.0)
MCH: 31.5 pg (ref 26.0–34.0)
MCHC: 30.1 g/dL (ref 30.0–36.0)
MCV: 104.7 fL — ABNORMAL HIGH (ref 80.0–100.0)
Platelets: 264 10*3/uL (ref 150–400)
RBC: 2.98 MIL/uL — ABNORMAL LOW (ref 4.22–5.81)
RDW: 14.6 % (ref 11.5–15.5)
WBC: 6.9 10*3/uL (ref 4.0–10.5)
nRBC: 0 % (ref 0.0–0.2)

## 2023-04-26 LAB — COMPREHENSIVE METABOLIC PANEL
ALT: 22 U/L (ref 0–44)
AST: 24 U/L (ref 15–41)
Albumin: 3.4 g/dL — ABNORMAL LOW (ref 3.5–5.0)
Alkaline Phosphatase: 42 U/L (ref 38–126)
Anion gap: 7 (ref 5–15)
BUN: 14 mg/dL (ref 8–23)
CO2: 29 mmol/L (ref 22–32)
Calcium: 8.6 mg/dL — ABNORMAL LOW (ref 8.9–10.3)
Chloride: 95 mmol/L — ABNORMAL LOW (ref 98–111)
Creatinine, Ser: 0.83 mg/dL (ref 0.61–1.24)
GFR, Estimated: >60 mL/min (ref >60)
Glucose, Bld: 74 mg/dL (ref 70–99)
Potassium: 4.5 mmol/L (ref 3.5–5.1)
Sodium: 131 mmol/L — ABNORMAL LOW (ref 135–145)
Total Bilirubin: 0.8 mg/dL (ref 0.3–1.2)
Total Protein: 7.4 g/dL (ref 6.5–8.1)

## 2023-04-26 LAB — BLOOD GAS, VENOUS
Acid-Base Excess: 4.8 mmol/L — ABNORMAL HIGH (ref 0.0–2.0)
Acid-Base Excess: 4.4 mmol/L — ABNORMAL HIGH (ref 0.0–2.0)
Bicarbonate: 33.5 mmol/L — ABNORMAL HIGH (ref 20.0–28.0)
Bicarbonate: 32.7 mmol/L — ABNORMAL HIGH (ref 20.0–28.0)
O2 Saturation: 31.7 %
O2 Saturation: 83.5 %
Patient temperature: 37
Patient temperature: 37
pCO2, Ven: 68 mmHg — ABNORMAL HIGH (ref 44–60)
pCO2, Ven: 65 mmHg — ABNORMAL HIGH (ref 44–60)
pH, Ven: 7.3 (ref 7.25–7.43)
pH, Ven: 7.31 (ref 7.25–7.43)
pO2, Ven: <31 mmHg — CL (ref 32–45)
pO2, Ven: 53 mmHg — ABNORMAL HIGH (ref 32–45)

## 2023-04-26 LAB — TROPONIN I (HIGH SENSITIVITY)
Troponin I (High Sensitivity): 31 ng/L — ABNORMAL HIGH (ref <18)
Troponin I (High Sensitivity): 31 ng/L — ABNORMAL HIGH (ref <18)
Troponin I (High Sensitivity): 29 ng/L — ABNORMAL HIGH (ref <18)

## 2023-04-26 LAB — PHOSPHORUS: Phosphorus: 4.4 mg/dL (ref 2.5–4.6)

## 2023-04-26 LAB — CREATININE, URINE, RANDOM: Creatinine, Urine: <10 mg/dL

## 2023-04-26 LAB — RETICULOCYTES
Immature Retic Fract: 30.5 % — ABNORMAL HIGH (ref 2.3–15.9)
RBC.: 3.13 MIL/uL — ABNORMAL LOW (ref 4.22–5.81)
Retic Count, Absolute: 212.2 10*3/uL — ABNORMAL HIGH (ref 19.0–186.0)
Retic Ct Pct: 6.8 % — ABNORMAL HIGH (ref 0.4–3.1)

## 2023-04-26 LAB — D-DIMER, QUANTITATIVE: D-Dimer, Quant: 1.53 ug/mL-FEU — ABNORMAL HIGH (ref 0.00–0.50)

## 2023-04-26 LAB — BRAIN NATRIURETIC PEPTIDE: B Natriuretic Peptide: 364.1 pg/mL — ABNORMAL HIGH (ref 0.0–100.0)

## 2023-04-26 LAB — PROTIME-INR
INR: 2.3 — ABNORMAL HIGH (ref 0.8–1.2)
Prothrombin Time: 25.1 seconds — ABNORMAL HIGH (ref 11.4–15.2)

## 2023-04-26 LAB — PROCALCITONIN: Procalcitonin: <0.1 ng/mL

## 2023-04-26 LAB — CK: Total CK: 87 U/L (ref 49–397)

## 2023-04-26 LAB — SODIUM, URINE, RANDOM: Sodium, Ur: 62 mmol/L

## 2023-04-26 LAB — MAGNESIUM: Magnesium: 1.8 mg/dL (ref 1.7–2.4)

## 2023-04-26 LAB — FOLATE: Folate: 18.5 ng/mL (ref >5.9)

## 2023-04-26 LAB — SEDIMENTATION RATE: Sed Rate: 50 mm/hr — ABNORMAL HIGH (ref 0–16)

## 2023-04-26 MED ORDER — FUROSEMIDE 10 MG/ML IJ SOLN
40.0000 mg | Freq: Every day | INTRAMUSCULAR | Status: DC
  Administered 2023-04-27: 40 mg via INTRAVENOUS
  Filled 2023-04-26: qty 4

## 2023-04-26 MED ORDER — BISACODYL 10 MG RE SUPP: 10.0000 mg | Freq: Every day | RECTAL | Status: DC | PRN

## 2023-04-26 MED ORDER — ONDANSETRON HCL 4 MG PO TABS: 4.0000 mg | ORAL_TABLET | Freq: Four times a day (QID) | ORAL | Status: DC | PRN

## 2023-04-26 MED ORDER — POLYETHYLENE GLYCOL 3350 17 G PO PACK
17.0000 g | PACK | Freq: Every day | ORAL | Status: DC | PRN
  Filled 2023-04-26: qty 1

## 2023-04-26 MED ORDER — ONDANSETRON HCL 4 MG/2ML IJ SOLN: 4.0000 mg | Freq: Four times a day (QID) | INTRAMUSCULAR | Status: DC | PRN

## 2023-04-26 MED ORDER — IOHEXOL 350 MG/ML SOLN
80.0000 mL | Freq: Once | INTRAVENOUS | Status: AC | PRN
  Administered 2023-04-26: 80 mL via INTRAVENOUS

## 2023-04-26 MED ORDER — POLYETHYLENE GLYCOL 3350 17 G PO PACK
17.0000 g | PACK | Freq: Every day | ORAL | Status: DC
  Administered 2023-04-27 – 2023-05-10 (×14): 17 g via ORAL
  Filled 2023-04-26 (×15): qty 1

## 2023-04-26 MED ORDER — ACETAMINOPHEN 325 MG PO TABS: 650.0000 mg | ORAL_TABLET | Freq: Four times a day (QID) | ORAL | Status: DC | PRN

## 2023-04-26 MED ORDER — SODIUM CHLORIDE 0.9 % IV SOLN
100.0000 mg | Freq: Two times a day (BID) | INTRAVENOUS | Status: DC
  Administered 2023-04-27: 100 mg via INTRAVENOUS
  Filled 2023-04-26 (×2): qty 100

## 2023-04-26 MED ORDER — SENNA 8.6 MG PO TABS
1.0000 | ORAL_TABLET | Freq: Two times a day (BID) | ORAL | Status: DC
  Administered 2023-04-27 – 2023-05-10 (×28): 8.6 mg via ORAL
  Filled 2023-04-26 (×28): qty 1

## 2023-04-26 MED ORDER — FUROSEMIDE 10 MG/ML IJ SOLN
40.0000 mg | Freq: Once | INTRAMUSCULAR | Status: AC
  Administered 2023-04-26: 40 mg via INTRAVENOUS
  Filled 2023-04-26: qty 4

## 2023-04-26 MED ORDER — SODIUM CHLORIDE 0.9% FLUSH: 3.0000 mL | INTRAVENOUS | Status: DC | PRN

## 2023-04-26 MED ORDER — ACETAMINOPHEN 650 MG RE SUPP: 650.0000 mg | Freq: Four times a day (QID) | RECTAL | Status: DC | PRN

## 2023-04-26 MED ORDER — SODIUM CHLORIDE 0.9% FLUSH
3.0000 mL | Freq: Two times a day (BID) | INTRAVENOUS | Status: DC
  Administered 2023-04-27 – 2023-05-10 (×26): 3 mL via INTRAVENOUS

## 2023-04-26 MED ORDER — INSULIN GLARGINE-YFGN 100 UNIT/ML ~~LOC~~ SOLN
40.0000 [IU] | Freq: Every day | SUBCUTANEOUS | Status: DC
  Administered 2023-04-27: 40 [IU] via SUBCUTANEOUS
  Filled 2023-04-26 (×2): qty 0.4

## 2023-04-26 MED ORDER — IPRATROPIUM-ALBUTEROL 0.5-2.5 (3) MG/3ML IN SOLN
3.0000 mL | Freq: Once | RESPIRATORY_TRACT | Status: AC
  Administered 2023-04-26: 3 mL via RESPIRATORY_TRACT
  Filled 2023-04-26: qty 3

## 2023-04-26 MED ORDER — GUAIFENESIN ER 600 MG PO TB12
600.0000 mg | ORAL_TABLET | Freq: Two times a day (BID) | ORAL | Status: DC
  Administered 2023-04-27 – 2023-05-10 (×28): 600 mg via ORAL
  Filled 2023-04-26 (×28): qty 1

## 2023-04-26 MED ORDER — THIAMINE MONONITRATE 100 MG PO TABS
100.0000 mg | ORAL_TABLET | Freq: Every day | ORAL | Status: DC
  Administered 2023-04-27 – 2023-05-10 (×14): 100 mg via ORAL
  Filled 2023-04-26 (×14): qty 1

## 2023-04-26 MED ORDER — DOCUSATE SODIUM 100 MG PO CAPS
100.0000 mg | ORAL_CAPSULE | Freq: Two times a day (BID) | ORAL | Status: DC
  Administered 2023-04-27: 100 mg via ORAL
  Filled 2023-04-26: qty 1

## 2023-04-26 MED ORDER — DOCUSATE SODIUM 100 MG PO CAPS
100.0000 mg | ORAL_CAPSULE | Freq: Two times a day (BID) | ORAL | Status: DC
  Administered 2023-04-27 – 2023-05-10 (×28): 100 mg via ORAL
  Filled 2023-04-26 (×29): qty 1

## 2023-04-26 MED ORDER — ALBUTEROL SULFATE (2.5 MG/3ML) 0.083% IN NEBU
2.5000 mg | INHALATION_SOLUTION | RESPIRATORY_TRACT | Status: DC | PRN
  Administered 2023-04-27: 2.5 mg via RESPIRATORY_TRACT
  Filled 2023-04-26: qty 3

## 2023-04-26 MED ORDER — METHYLPREDNISOLONE SODIUM SUCC 125 MG IJ SOLR
125.0000 mg | INTRAMUSCULAR | Status: AC
  Administered 2023-04-26: 125 mg via INTRAVENOUS
  Filled 2023-04-26: qty 2

## 2023-04-26 MED ORDER — IPRATROPIUM-ALBUTEROL 0.5-2.5 (3) MG/3ML IN SOLN
3.0000 mL | Freq: Four times a day (QID) | RESPIRATORY_TRACT | Status: DC
  Administered 2023-04-27: 3 mL via RESPIRATORY_TRACT
  Filled 2023-04-26: qty 3

## 2023-04-26 MED ORDER — ROSUVASTATIN CALCIUM 20 MG PO TABS
40.0000 mg | ORAL_TABLET | Freq: Every day | ORAL | Status: DC
  Administered 2023-04-27 – 2023-05-10 (×14): 40 mg via ORAL
  Filled 2023-04-26 (×14): qty 2

## 2023-04-26 MED ORDER — SODIUM CHLORIDE 0.9 % IV SOLN: 250.0000 mL | INTRAVENOUS | Status: DC | PRN

## 2023-04-26 MED ORDER — HYDROCODONE-ACETAMINOPHEN 5-325 MG PO TABS
1.0000 | ORAL_TABLET | ORAL | Status: DC | PRN
  Administered 2023-05-02: 1 via ORAL
  Filled 2023-04-26 (×2): qty 1

## 2023-04-26 MED ORDER — SODIUM CHLORIDE 0.9 % IV SOLN
3.0000 g | Freq: Four times a day (QID) | INTRAVENOUS | Status: DC
  Administered 2023-04-27 (×2): 3 g via INTRAVENOUS
  Filled 2023-04-26 (×2): qty 8

## 2023-04-26 NOTE — H&P (Signed)
Jason [name]:[id number] DOB: 02/28/1941 DOA: 04/26/2023     PCP: [name], MD   Outpatient Specialists:   CARDS:   Dr. [name], MD  Patient arrived to ER on 04/26/23 at 1411 Referred by Attending Virgina Norfolk, DO   Patient coming from:    home Lives With family   Chief Complaint:   Chief Complaint  Patient presents with   Shortness of Breath    HPI: Jason [name] is a 82 y.o. male with medical history significant of COPD,  DM2 sp right forefoot amputation, severe mitral stenosis and regurgitation, history of atrial flutter on Xarelto, HLD, bladder cancer, sleep apnea    Presented with  worsening SOB History of COPD and heart failure presented for worsening shortness of breath found to be hypoxic 86% on room air  Endorses shortness of breath with exertion which is somewhat normal for him no chest pain  Of note patient have had recent transmetatarsal amputation of his right foot followed by podiatry Has been on Augmentin and doxycycline for presumably foot infection   Lab Results  Component Value Date   SARSCOV2NAA NEGATIVE 04/26/2023   SARSCOV2NAA NOT DETECTED 07/21/2019   SARSCOV2NAA NEGATIVE 05/14/2019   SARSCOV2NAA NEGATIVE 05/07/2019        Regarding pertinent Chronic problems:     Hyperlipidemia -  on statins Crestor Lipid Panel     Component Value Date/Time   CHOL 188 03/29/2023 1412   TRIG 118.0 03/29/2023 1412   HDL 50.40 03/29/2023 1412   CHOLHDL 4 03/29/2023 1412   VLDL 23.6 03/29/2023 1412   LDLCALC 114 (H) 03/29/2023 1412   LDLCALC 79 05/21/2020 1458     Nonrheumatic mitral valvular disease- There is mixed severe mitral stenosis and severe mitral  regurgitation.  last echo  Recent Results (from the past 95621 hour(s))  ECHO TEE   Collection Time: 08/12/21  9:55 AM  Result Value   Radius 0.90   MV M vel 5.02   MV VTI 1.58   MV Peak grad 100.8   P 1/2 time 145.0   Narrative      TRANSESOPHOGEAL ECHO REPORT        IMPRESSIONS    1. The mitral valve appears rheumatic with severely calcified anterior and posterior leaflets. The leaflets demonstrate restricted movement in systole/diastole. There is mixed severe mitral stenosis and severe mitral regurgitation. The MG is 12 mmHG @  75 bpm. PHT 145 ms which equates to MVA 1.52 cm2. Direct 3D planimetry shows MVA 1.40-1.50 cm2, consistent with severe mitral stenosis. There is also severe, eccentric mitral regurgitation. This is due to severely restricted movement of the posterior MV  leaflet in systole/diastole (IIIA). Regarding MR, 2D PISA 0.9 cm2, 2D ERO 0.37 cm2, R vol 54 cc. 3D VCA 0.35 cm2. There is systolic reversal of flow in the left upper and left lower pulmonary veins. All consistent with severe MR. Overall, there is  combined severe mitral stenosis and severe mitral regurgitation related to rheumatic mitral valve disease with severe leaflet calcificaiton. The mitral valve is rheumatic. Severe mitral valve regurgitation. Severe mitral stenosis.  2. Left ventricular ejection fraction, by estimation, is 60 to 65%. The left ventricle has normal function.  3. Right ventricular systolic function is normal. The right ventricular size is normal.  4. Left atrial size was severely dilated. No left atrial/left atrial appendage thrombus was detected. The LAA emptying velocity was 55 cm/s.  5. Right atrial size was mildly dilated.  6. The aortic valve is tricuspid. There is mild calcification of the aortic valve. Aortic valve regurgitation is not visualized.  7. There is mild (Grade II) layered plaque involving the descending aorta.  8. Agitated saline contrast bubble study was positive with shunting observed after >6 cardiac cycles suggestive of intrapulmonary shunting.              DM 2 -  Lab Results  Component Value Date   HGBA1C 13.3 (H) 03/29/2023   on insulin,         COPD -  not  on baseline oxygen  *L,      OSA - noncompliant with CPAP     A.  Flutter-  - CHA2DS2 vas score 3    anticoagulation with Xarelto on hold      Chronic anemia - baseline hg Hemoglobin & Hematocrit  Recent Labs    03/31/23 0344 04/01/23 0343 04/26/23 1440  HGB 13.3 12.2* 9.4*   Iron/TIBC/Ferritin/ %Sat    Component Value Date/Time   IRON 78 06/30/2020 1152   FERRITIN 61 06/30/2020 1152   IRONPCTSAT 18.2 (L) 07/19/2019 1657     While in ER: Clinical Course as of 04/26/23 1852  Tue Apr 26, 2023  1602 Patient feels like he has not had any significant change after the DuoNeb. [RP]  1653 Signed out to Dr Lockie Mola.  [RP]    Clinical Course User Index [RP] Rondel Baton, MD     Lab Orders         Resp panel by RT-PCR (RSV, Flu A&B, Covid) Anterior Nasal Swab         Comprehensive metabolic panel         CBC         Blood gas, venous         Brain natriuretic peptide         D-dimer, quantitative       CXR - Cardiac enlargement with small bilateral pleural effusions and perihilar/basilar infiltrates, likely edema although pneumonia could have this appearance as well.    CTA chest -  1. No evidence of significant pulmonary embolus. 2. Cardiac enlargement. 3. Aortic atherosclerosis. 4. Moderate bilateral pleural effusions with bilateral basilar consolidation, possibly pneumonia or compressive atelectasis.  Following Medications were ordered in ER: Medications  ipratropium-albuterol (DUONEB) 0.5-2.5 (3) MG/3ML nebulizer solution 3 mL (3 mLs Nebulization Given 04/26/23 1521)  methylPREDNISolone sodium succinate (SOLU-MEDROL) 125 mg/2 mL injection 125 mg (125 mg Intravenous Given 04/26/23 1614)  ipratropium-albuterol (DUONEB) 0.5-2.5 (3) MG/3ML nebulizer solution 3 mL (3 mLs Nebulization Given 04/26/23 1614)  furosemide (LASIX) injection 40 mg (40 mg Intravenous Given 04/26/23 1809)  iohexol (OMNIPAQUE) 350 MG/ML injection 80 mL (80 mLs Intravenous Contrast Given 04/26/23 1707)       ED Triage Vitals  Enc Vitals Group     BP 04/26/23  1421 (!) 141/88     Pulse Rate 04/26/23 1421 76     Resp 04/26/23 1421 13     Temp 04/26/23 1421 98.7 F (37.1 C)     Temp Source 04/26/23 1421 Oral     SpO2 04/26/23 1421 93 %     Weight --      Height --      Head Circumference --      Peak Flow --      Pain Score 04/26/23 1430 0     Pain Loc --      Pain Edu? --  Excl. in GC? --   WUJW(11)@     _________________________________________ Significant initial  Findings: Abnormal Labs Reviewed  COMPREHENSIVE METABOLIC PANEL - Abnormal; Notable for the following components:      Result Value   Sodium 131 (*)    Chloride 95 (*)    Calcium 8.6 (*)    Albumin 3.4 (*)    All other components within normal limits  CBC - Abnormal; Notable for the following components:   RBC 2.98 (*)    Hemoglobin 9.4 (*)    HCT 31.2 (*)    MCV 104.7 (*)    All other components within normal limits  BLOOD GAS, VENOUS - Abnormal; Notable for the following components:   pCO2, Ven 68 (*)    pO2, Ven <31 (*)    Bicarbonate 33.5 (*)    Acid-Base Excess 4.8 (*)    All other components within normal limits  BRAIN NATRIURETIC PEPTIDE - Abnormal; Notable for the following components:   B Natriuretic Peptide 364.1 (*)    All other components within normal limits  D-DIMER, QUANTITATIVE - Abnormal; Notable for the following components:   D-Dimer, Quant 1.53 (*)    All other components within normal limits  TROPONIN I (HIGH SENSITIVITY) - Abnormal; Notable for the following components:   Troponin I (High Sensitivity) 31 (*)    All other components within normal limits      _________________________ Troponin  ordered Cardiac Panel   Recent Labs    04/26/23 1440  TROPONINIHS 31*     ECG: Ordered Personally reviewed and interpreted by me showing: HR : 82 Rhythm:inus rhythm Multiform ventricular premature complexes Prolonged PR interval Probable left atrial enlargement Abnormal R-wave progression, early transition Borderline T  abnormalities, lateral leads Borderline prolonged QT interval Ischemic changes*nonspecific changes, no evidence of ischemic changes QTC 496   BNP (last 3 results) Recent Labs    04/26/23 1441  BNP 364.1*     COVID-19 Labs  Recent Labs    04/26/23 1440  DDIMER 1.53*    Lab Results  Component Value Date   SARSCOV2NAA NEGATIVE 04/26/2023   SARSCOV2NAA NOT DETECTED 07/21/2019   SARSCOV2NAA NEGATIVE 05/14/2019   SARSCOV2NAA NEGATIVE 05/07/2019    The recent clinical data is shown below. Vitals:   04/26/23 1434 04/26/23 1630 04/26/23 1810 04/26/23 1850  BP:  (!) 144/68    Pulse: 73 77  84  Resp: 17 (!) 24  (!) 24  Temp:   97.6 F (36.4 C)   TempSrc:   Oral   SpO2: 96% 97%  97%    WBC     Component Value Date/Time   WBC 6.9 04/26/2023 1440   LYMPHSABS 0.9 03/29/2023 1412   MONOABS 0.4 03/29/2023 1412   EOSABS 0.2 03/29/2023 1412   BASOSABS 0.0 03/29/2023 1412     Procalcitonin   Ordered      UA  ordered     Results for orders placed or performed during the hospital encounter of 04/26/23  Resp panel by RT-PCR (RSV, Flu A&B, Covid) Anterior Nasal Swab     Status: None   Collection Time: 04/26/23  2:40 PM   Specimen: Anterior Nasal Swab  Result Value Ref Range Status   SARS Coronavirus 2 by RT PCR NEGATIVE NEGATIVE Final         Influenza A by PCR NEGATIVE NEGATIVE Final   Influenza B by PCR NEGATIVE NEGATIVE Final         Resp Syncytial Virus by PCR NEGATIVE  NEGATIVE Final           Venous  Blood Gas ordered    __________________________________________________________ Recent Labs  Lab 04/26/23 1440  NA 131*  K 4.5  CO2 29  GLUCOSE 74  BUN 14  CREATININE 0.83  CALCIUM 8.6*    Cr   stable  Lab Results  Component Value Date   CREATININE 0.83 04/26/2023   CREATININE 0.91 04/02/2023   CREATININE 0.97 04/01/2023    Recent Labs  Lab 04/26/23 1440  AST 24  ALT 22  ALKPHOS 42  BILITOT 0.8  PROT 7.4  ALBUMIN 3.4*   Lab Results   Component Value Date   CALCIUM 8.6 (L) 04/26/2023       Plt: Lab Results  Component Value Date   PLT 264 04/26/2023    INR/Prothrombin Time  2.3 High         Recent Labs  Lab 04/26/23 1440  WBC 6.9  HGB 9.4*  HCT 31.2*  MCV 104.7*  PLT 264    HG/HCT   stable,      Component Value Date/Time   HGB 9.4 (L) 04/26/2023 1440   HGB 12.1 (L) 08/03/2021 1042   HCT 31.2 (L) 04/26/2023 1440   HCT 35.5 (L) 08/03/2021 1042   MCV 104.7 (H) 04/26/2023 1440   MCV 92 08/03/2021 1042    _______________________________________________ Hospitalist was called for admission for   Acute respiratory failure with hypoxia (HCC)    The following Work up has been ordered so far:  Orders Placed This Encounter  Procedures   Resp panel by RT-PCR (RSV, Flu A&B, Covid) Anterior Nasal Swab   DG Chest 2 View   CT Angio Chest PE W and/or Wo Contrast   Comprehensive metabolic panel   CBC   Blood gas, venous   Brain natriuretic peptide   D-dimer, quantitative   ED Cardiac monitoring   Initiate Carrier Fluid Protocol   Consult to hospitalist   EKG 12-Lead   Saline lock IV     OTHER Significant initial  Findings:  labs showing:     DM  labs:  HbA1C: Recent Labs    03/29/23 1412  HGBA1C 13.3*       CBG (last 3)  No results for input(s): "GLUCAP" in the last 72 hours.        Cultures:    Component Value Date/Time   SDES  03/30/2023 1453    BONE RIGHT FOOT Performed at [facility], [address], [place], [place] [address]    SPECREQUEST  03/30/2023 1453    NONE Performed at [facility], [address] [place], [place] [address]    CULT  03/30/2023 1453    No growth aerobically or anaerobically. Performed at [facility], [address] [place], [place] [address]    REPTSTATUS 04/04/2023 FINAL 03/30/2023 1453     Radiological Exams on Admission: CT Angio Chest PE W and/or Wo Contrast  Result Date:  04/26/2023 CLINICAL DATA:  Hypoxia with recent surgery. Low oxygen saturation. Shortness of breath on exertion. EXAM: CT ANGIOGRAPHY CHEST WITH CONTRAST TECHNIQUE: Multidetector CT imaging of the chest was performed using the standard protocol during bolus administration of intravenous contrast. Multiplanar CT image reconstructions and MIPs were obtained to evaluate the vascular anatomy. RADIATION DOSE REDUCTION: This exam was performed according to the departmental dose-optimization program which includes automated exposure control, adjustment of the mA and/or kV according to patient size and/or use of iterative reconstruction  technique. CONTRAST:  80mL OMNIPAQUE IOHEXOL 350 MG/ML SOLN COMPARISON:  Chest radiograph 04/26/2023 FINDINGS: Cardiovascular: Technically adequate study with moderately good opacification of the central and segmental pulmonary arteries. No focal filling defects are identified. No evidence of significant pulmonary embolus. Cardiac enlargement. No pericardial effusion. Normal caliber thoracic aorta. No aortic dissection. Calcification of the aorta and coronary arteries. Mediastinum/Nodes: Thyroid gland is unremarkable. Esophagus is decompressed. Mediastinal lymphadenopathy with numerous lymph nodes throughout all quadrants of the mediastinum and extending up into the supraclavicular base of neck. Largest right paratracheal nodes measure up to about 1.7 cm short axis dimension. Etiology is nonspecific, possibly reactive or inflammatory. Lymphoproliferative process is not excluded. Clinical correlation is recommended. Lungs/Pleura: Moderate bilateral pleural effusions with bilateral basilar consolidation or atelectasis. No pneumothorax. Upper Abdomen: No acute abnormalities demonstrated in the visualized upper abdomen. Musculoskeletal: Degenerative changes in the spine. No acute bony abnormalities. Review of the MIP images confirms the above findings. IMPRESSION: 1. No evidence of significant  pulmonary embolus. 2. Cardiac enlargement. 3. Aortic atherosclerosis. 4. Moderate bilateral pleural effusions with bilateral basilar consolidation, possibly pneumonia or compressive atelectasis. Electronically Signed   By: Burman Nieves M.D.   On: 04/26/2023 18:21   DG Chest 2 View  Result Date: 04/26/2023 CLINICAL DATA:  Cough and hypoxia. Low oxygen saturation. Shortness of breath on exertion. EXAM: CHEST - 2 VIEW COMPARISON:  10/14/2016 FINDINGS: Shallow inspiration. Mild cardiac enlargement. Bilateral perihilar and basilar infiltrates with small bilateral pleural effusions, progressing since prior study. No pneumothorax. Mediastinal contours appear intact. IMPRESSION: Cardiac enlargement with small bilateral pleural effusions and perihilar/basilar infiltrates, likely edema although pneumonia could have this appearance as well. Electronically Signed   By: Burman Nieves M.D.   On: 04/26/2023 16:25   _______________________________________________________________________________________________________ Latest  Blood pressure (!) 144/68, pulse 84, temperature 97.6 F (36.4 C), temperature source Oral, resp. rate (!) 24, SpO2 97 %.   Vitals  labs and radiology finding personally reviewed  Review of Systems:    Pertinent positives include:  fatigue,   shortness of breath at rest.  dyspnea on exertion,  Constitutional:  No weight loss, night sweats, Fevers, chills, weight loss  HEENT:  No headaches, Difficulty swallowing,Tooth/dental problems,Sore throat,  No sneezing, itching, ear ache, nasal congestion, post nasal drip,  Cardio-vascular:  No chest pain, Orthopnea, PND, anasarca, dizziness, palpitations.no Bilateral lower extremity swelling  GI:  No heartburn, indigestion, abdominal pain, nausea, vomiting, diarrhea, change in bowel habits, loss of appetite, melena, blood in stool, hematemesis Resp:  no No excess mucus, no productive cough, No non-productive cough, No coughing up of  blood.No change in color of mucus.No wheezing. Skin:  no rash or lesions. No jaundice GU:  no dysuria, change in color of urine, no urgency or frequency. No straining to urinate.  No flank pain.  Musculoskeletal:  No joint pain or no joint swelling. No decreased range of motion. No back pain.  Psych:  No change in mood or affect. No depression or anxiety. No memory loss.  Neuro: no localizing neurological complaints, no tingling, no weakness, no double vision, no gait abnormality, no slurred speech, no confusion  All systems reviewed and apart from HOPI all are negative _______________________________________________________________________________________________ Past Medical History:   Past Medical History:  Diagnosis Date   Anxiety    Asthma    in past, no inhaler now   Benign neoplasm of colon    Bone infection (HCC)    Cancer (HCC)    bladder cancer   COPD (chronic obstructive  pulmonary disease) (HCC)    Depression    Diverticulosis of colon (without mention of hemorrhage)    DJD (degenerative joint disease)    Esophageal reflux    Glaucoma    Hidradenitis    History of BPH    History of gynecomastia    Unilateral   Hyperlipidemia    Irritable bowel syndrome    Memory loss    Mitral stenosis    mild-moderate MS 01/2016   MVP (mitral valve prolapse)    Obesity, unspecified    Pneumonia    Sleep apnea    does not use  cpap or bipap .. no study  ever   Type II or unspecified type diabetes mellitus without mention of complication, not stated as uncontrolled    Unspecified venous (peripheral) insufficiency       Past Surgical History:  Procedure Laterality Date   ACHILLES TENDON SURGERY Right 05/17/2019   Procedure: ACHILLES LENGTHENING/KIDNER;  Surgeon: Park Liter, DPM;  Location: [facility] OR;  Service: Podiatry;  Laterality: Right;   AMPUTATION  08/09/2012   Procedure: AMPUTATION DIGIT;  Surgeon: Nadara Mustard, MD;  Location: [facility] OR;  Service: Orthopedics;   Laterality: Right;  Amputation right Great Toe MTP Joint   AMPUTATION  10/17/2012   Procedure: AMPUTATION DIGIT;  Surgeon: Nadara Mustard, MD;  Location: [facility] OR;  Service: Orthopedics;  Laterality: Right;  Right Foot 3rd Toe Amputation at Metatarsophalangeal Joint   AMPUTATION Right 03/30/2023   Procedure: TRANSMETATARSAL AMPUTATION, RIGHT FOOT;  Surgeon: Pilar Plate, DPM;  Location: [facility];  Service: Podiatry;  Laterality: Right;   AMPUTATION TOE Right 05/17/2019   Procedure: AMPUTATION TOE Metatarsal Phalangeal Joint   FOURTH AND FIFTH, FILLETED TOE FLAP;  Surgeon: Park Liter, DPM;  Location: [facility];  Service: Podiatry;  Laterality: Right;   BUBBLE STUDY  08/12/2021   Procedure: BUBBLE STUDY;  Surgeon: Sande Rives, MD;  Location[facility]OSCOPY;  Service: Cardiovascular;;   CHOLECYSTECTOMY N/A 06/14/2018   Procedure: LAPAROSCOPIC CHOLECYSTECTOMY WITH INTRAOPERATIVE CHOLANGIOGRAM;  Surgeon: Rodman Pickle, MD;  Location: [facility];  Service: General;  Laterality: N/A;   CIRCUMCISION  06/2100   For Phimosis - Dr Marcello Fennel   COLON SURGERY     CYSTOSCOPY W/ RETROGRADES Bilateral 07/07/2018   Procedure: CYSTOSCOPY WITH BILATERAL RETROGRADE PYELOGRAM;  Surgeon: Crist Fat, MD;  Location: [facility];  Service: Urology;  Laterality: Bilateral;   KNEE ARTHROSCOPY     LAPAROSCOPIC LYSIS OF ADHESIONS  06/14/2018   Procedure: LAPAROSCOPIC LYSIS OF ADHESIONS;  Surgeon: Sheliah Hatch De Blanch, MD;  Location: [facility];  Service: General;;   LAPAROTOMY  07/20/11   LASER ABLATION  06/2009   Left Greater Saphenous vein -Dr Jenean Lindau OSTEOTOMY Right 05/17/2019   Procedure: METATARSLSECTOMY FOURTH DIGIT;  Surgeon: Park Liter, DPM;  Location: [facility];  Service: Podiatry;  Laterality: Right;   MULTIPLE TOOTH EXTRACTIONS     TEE WITHOUT CARDIOVERSION N/A 08/12/2021   Procedure: TRANSESOPHAGEAL ECHOCARDIOGRAM (TEE);  Surgeon: Sande Rives, MD;  Location: [facility]OSCOPY;   Service: Cardiovascular;  Laterality: N/A;   TOE AMPUTATION  06/2009   x3Right foot second toe -Dr Lestine Box   TONSILLECTOMY     TRANSURETHRAL RESECTION OF BLADDER TUMOR N/A 07/07/2018   Procedure: TRANSURETHRAL RESECTION OF BLADDER TUMOR (TURBT) WITH POST OP INSTILLATION OF GEMCITABIN;  Surgeon: Crist Fat, MD;  Location: [facility];  Service: Urology;  Laterality: N/A;   WOUND DEBRIDEMENT Right 07/25/2019  Procedure: Right foot wound debridement with application of skin graft substitute;  Surgeon: Park Liter, DPM;  Location: [facility];  Service: Podiatry;  Laterality: Right;    Social History:  Ambulatory   cane, walker      reports that he quit smoking about 7 years ago. His smoking use included cigarettes. He has a 0.50 pack-year smoking history. He has never used smokeless tobacco. He reports that he does not currently use alcohol after a past usage of about 7.0 standard drinks of alcohol per week. He reports that he does not use drugs.     Family History:   Family History  Problem Relation Age of Onset   Diabetes Father    Cancer Father        Prostate   Stroke Father    Prostate cancer Father    High blood pressure Other    Colon cancer Neg Hx    Esophageal cancer Neg Hx    Rectal cancer Neg Hx    Stomach cancer Neg Hx    ______________________________________________________________________________________________ Allergies: Allergies  Allergen Reactions   Bactrim [Sulfamethoxazole-Trimethoprim] Rash     Prior to Admission medications   Medication Sig Start Date End Date Taking? Authorizing Provider  acetaminophen (TYLENOL) 325 MG tablet Take 2 tablets (650 mg total) by mouth every 6 (six) hours as needed for mild pain (or Fever >/= 101). 04/02/23   Marinda Elk, MD  amoxicillin-clavulanate (AUGMENTIN) 875-125 MG tablet Take 1 tablet by mouth 2 (two) times daily for 14 days. 04/22/23 05/06/23  Standiford, Jenelle Mages, DPM  B-D UF III MINI PEN NEEDLES 31G X  5 MM MISC  07/10/21   [provider]  Bempedoic Acid-Ezetimibe (NEXLIZET) 180-10 MG TABS Take 1 tablet by mouth daily. 04/21/23   [name], MD  Continuous Glucose Receiver (FREESTYLE LIBRE 2 READER) DEVI 1 Act by Does not apply route daily. 04/13/23   [name], MD  Continuous Glucose Sensor (FREESTYLE LIBRE 2 SENSOR) MISC 1 Act by Does not apply route daily. 04/13/23   [name], MD  cyanocobalamin (VITAMIN B12) 500 MCG tablet Take 500 mcg by mouth daily.    [provider]  doxycycline (VIBRA-TABS) 100 MG tablet Take 1 tablet (100 mg total) by mouth 2 (two) times daily for 14 days. 04/22/23 05/06/23  Standiford, Jenelle Mages, DPM  Glucagon (GVOKE HYPOPEN 2-PACK) 1 MG/0.2ML SOAJ Inject 1 Act into the skin daily as needed. Patient not taking: Reported on 03/29/2023 11/10/21   [name], MD  insulin aspart (NOVOLOG) 100 UNIT/ML FlexPen Inject 8 Units into the skin 3 (three) times daily with meals. 04/02/23   Marinda Elk, MD  insulin glargine, 2 Unit Dial, (TOUJEO MAX SOLOSTAR) 300 UNIT/ML Solostar Pen Inject 50 Units into the skin daily. 04/02/23   Marinda Elk, MD  Insulin Pen Needle (B-D UF III MINI PEN NEEDLES) 31G X 5 MM MISC USE 1 PEN NEEDLE IN THE MORNING AND AT BEDTIME 04/05/23   [name], MD  metFORMIN (GLUCOPHAGE) 500 MG tablet Take 2 tablets (1,000 mg total) by mouth 2 (two) times daily with a meal. 04/02/23   Marinda Elk, MD  rivaroxaban (XARELTO) 20 MG TABS tablet Take 1 tablet (20 mg total) by mouth daily. Patient not taking: Reported on 03/29/2023 10/18/22   [name], MD  rosuvastatin (CRESTOR) 40 MG tablet Take 1 tablet (40 mg total) by mouth daily. 04/08/23 07/07/23  [name], MD  thiamine (VITAMIN B-1)  100 MG tablet Take 100 mg by mouth daily.    [provider]  Vitamin D, Ergocalciferol, (DRISDOL) 1.25 MG (50000 UNIT) CAPS capsule Take 50,000 Units by mouth every Thursday.    [provider]     ___________________________________________________________________________________________________ Physical Exam:    04/26/2023    6:50 PM 04/26/2023    4:30 PM 04/26/2023    2:34 PM  Vitals with BMI  Systolic  144   Diastolic  68   Pulse 84 77 73     1. General:  in No  Acute distress   Chronically ill   -appearing 2. Psychological: Alert and   Oriented 3. Head/ENT:   Moist  Mucous Membranes                          Head Non traumatic, neck supple                          Poor Dentition 4. SKIN: normal  Skin turgor,  Skin clean Dry and intact no rash Right foot wound    5. Heart: Regular rate and rhythm systolic Murmur, no Rub or gallop 6. Lungs no wheezes or crackles  diminished  7. Abdomen: Soft,  non-tender,   distended   obese  bowel sounds present 8. Lower extremities: no clubbing, cyanosis, 1+ edema 9. Neurologically Grossly intact, moving all 4 extremities equally   10. MSK: Normal range of motion    Chart has been reviewed  ______________________________________________________________________________________________  Assessment/Plan  82 y.o. 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  Possibly secondary to need for recent foot Surgery Not on beta-blocker  Acute respiratory failure with hypoxia (HCC) CT chest showing bilateral pleural effusions with compressive atelectasis Doubt pneumonia as there is no evidence of infection Check procalcitonin Continue oxygen Flutter valve Treat underlying CHF exacerbation Continue COPD medications  Acute diastolic CHF (congestive heart failure) (HCC) - Pt diagnosed with CHF based on presence of the following: , rales   JVD, cardiomegaly, Pulmonary edema on CXR, and   bilateral leg edema, DOE,  pleural effusion  With noted response to IV diuretic in ER  admit on telemetry,  cycle cardiac enzymes,    obtain serial ECG  to evaluate for ischemia as a cause of heart failure  monitor daily weight: There were no vitals filed for this visit. Last BNP BNP (last 3 results) Recent Labs    04/26/23 1441  BNP 364.1*      diurese with IV lasix and monitor orthostatics and creatinine to avoid over diuresis.  Order echogram to evaluate EF and valves   Suspect valvular disease is contributing. Repeat echogram Diuresis with Lasix 40 IV daily Would benefit from cardiology consult given significant valvular disease   Hyperlipidemia Continue Crestor 40 mg a day  Status post amputation of right foot [facility]) Patient followed by podiatry Examined wound appears to be similar to when he was seen by podiatrist earlier. Continue with wound dressing will need to follow-up as an outpatient At this point no evidence of acute infection Check sed rate and CRP  On Augmentin and doxycycline presumably for recent foot infection requiring amputation. Continue change to Unasyn/doxycycline IV while p.o. intake diminished  COPD (chronic obstructive pulmonary disease) (HCC) Was initially given a dose of steroids Symptoms less consistent with COPD no  associated wheezing Chest imaging more consistent with bilateral pleural effusions contributing to worsening shortness of breath and hypoxia Given open wound we will hold off on steroids as this may worsen wound healing  Normocytic anemia Obtain anemia panel Hemoccult stool  Hyponatremia In the setting of fluid overload  Check urine electrolytes  Elevated INR Elevated INR at 2.3 unclear etiology will need to calrify if pt is on anticoagulation     Other plan as per orders.  DVT prophylaxis:  SCD      Code Status:    Code Status: Prior FULL CODE as per patient   I had personally discussed CODE STATUS with patient     ACP none   Family Communication:   Family not at  Bedside     Diet  heart healthy   Disposition Plan:      To home once workup is complete and patient is stable   Following barriers for discharge:                            Electrolytes corrected                               Anemia stable                                                    Will need consultants to evaluate patient prior to discharge       Consult Orders  (From admission, onward)           Start     Ordered   04/26/23 1843  Consult to hospitalist  Once       Provider:  (Not yet assigned)  Question Answer Comment  Place call to: [facility]   Reason for Consult Admit      04/26/23 1842                               Would benefit from PT/OT eval prior to DC  Ordered                                       Diabetes care coordinator                   Transition of care consulted  Nutrition    consulted                  Wound care  consulted                      Consults called:  emailed cardiology    Admission status:  ED Disposition     ED Disposition  Admit   Condition  --   Comment  Hospital Area: [facility] [[id number]]  Level of Care: Telemetry [5]  Admit to tele based on following criteria: Acute CHF  May admit  patient to [facility] or [facility] if equivalent level of care is available:: No  Covid Evaluation: Asymptomatic - no recent exposure (last 10 days) testing not required  Diagnosis: Acute diastolic CHF (congestive heart failure) [facility]) [[id number]]  Admitting Physician: [name] [[id number]]  Attending Physician: [name] [[id number]]  Certification:: I certify this patient will need inpatient services for at least 2 midnights  Estimated Length of Stay: 2            inpatient     I Expect 2 midnight stay secondary to severity of patient's current illness need for inpatient interventions justified by the following:  hemodynamic instability despite optimal treatment ( hypoxia, )  Severe lab/radiological/exam abnormalities including:    Hypoxia pleural effusion  and extensive comorbidities including:    DM2   CHF    COPD/asthma   History of amputation    That are currently affecting medical management.   I expect  patient to be hospitalized for 2 midnights requiring inpatient medical care.  Patient is at high risk for adverse outcome (such as loss of life or disability) if not treated.  Indication for inpatient stay as follows:    Need for operative/procedural  intervention New or worsening hypoxia   Need for IV antibiotics, IV fluids,     Level of care     tele  For  24H       Lab Results  Component Value Date   SARSCOV2NAA NEGATIVE 04/26/2023     Jaz Mallick 04/26/2023, 8:16 PM    [facility]     after 2 AM please page floor coverage PA If 7AM-7PM, please contact the day team taking care of the patient using [email or web address]

## 2023-04-26 NOTE — Assessment & Plan Note (Signed)
Obtain anemia panel Hemoccult stool 

## 2023-04-26 NOTE — Assessment & Plan Note (Signed)
CT chest showing bilateral pleural effusions with compressive atelectasis Doubt pneumonia as there is no evidence of infection Check procalcitonin Continue oxygen Flutter valve Treat underlying CHF exacerbation Continue COPD medications

## 2023-04-26 NOTE — Progress Notes (Signed)
PHARMACY NOTE -  Unasyn  Pharmacy has been assisting with dosing of Unasyn for wound infection. Dosage remains stable at 3g IV q6 hr and further renal adjustments per institutional Pharmacy antibiotic protocol  Pharmacy will sign off, following peripherally for culture results, dose adjustments, and length of therapy. Please reconsult if a change in clinical status warrants re-evaluation of dosage.  Bernadene Person, PharmD, BCPS [phone] 04/26/2023, 8:20 PM

## 2023-04-26 NOTE — ED Provider Notes (Signed)
[place] EMERGENCY DEPARTMENT AT [facility] Provider Note   CSN: [id number] Arrival date & time: 04/26/23  1411     History {Add pertinent medical, surgical, social history, OB history to HPI:1} Chief Complaint  Patient presents with   Low Oxygen    DEACAN SCHARFF is a 82 y.o. male.  82 year old male with a history of atrial flutter on Xarelto, diabetes, COPD, mitral valve prolapse, and self-reported CHF presents emergency department shortness of breath and hypoxia.  Patient refusing to give history at this time.  Per wife Malachi Bonds, went to the surgeon on Friday after his transmetatarsal amputation that was several weeks ago and had been on antibiotics for 10 days and was told to stop taking them but they were resumed on Friday for 14 days (augmentin and doxy). She isn't sure what is going on with his breathing. Started having heavy breathing and coughing and wheezing after returning from the hospital that is gradually worsened. They were told that his lungs were clear when he left the hospital. He did smoke previously. Does have a hx of MVP and CHF. Does not take a fluid pill. His xarelto was stopped and has not started again. Not on home oxygen. Went to [facility] today and his oxygen saturations were 83% on room air and was referred to the emergency department.  No fevers recently.  Says his cough is nonproductive.       Home Medications Prior to Admission medications   Medication Sig Start Date End Date Taking? Authorizing Provider  acetaminophen (TYLENOL) 325 MG tablet Take 2 tablets (650 mg total) by mouth every 6 (six) hours as needed for mild pain (or Fever >/= 101). 04/02/23   Marinda Elk, MD  amoxicillin-clavulanate (AUGMENTIN) 875-125 MG tablet Take 1 tablet by mouth 2 (two) times daily for 14 days. 04/22/23 05/06/23  Standiford, Jenelle Mages, DPM  B-D UF III MINI PEN NEEDLES 31G X 5 MM MISC  07/10/21   [provider]  Bempedoic Acid-Ezetimibe  (NEXLIZET) 180-10 MG TABS Take 1 tablet by mouth daily. 04/21/23   [name], MD  Continuous Glucose Receiver (FREESTYLE LIBRE 2 READER) DEVI 1 Act by Does not apply route daily. 04/13/23   [name], MD  Continuous Glucose Sensor (FREESTYLE LIBRE 2 SENSOR) MISC 1 Act by Does not apply route daily. 04/13/23   [name], MD  cyanocobalamin (VITAMIN B12) 500 MCG tablet Take 500 mcg by mouth daily.    [provider]  doxycycline (VIBRA-TABS) 100 MG tablet Take 1 tablet (100 mg total) by mouth 2 (two) times daily for 14 days. 04/22/23 05/06/23  Standiford, Jenelle Mages, DPM  Glucagon (GVOKE HYPOPEN 2-PACK) 1 MG/0.2ML SOAJ Inject 1 Act into the skin daily as needed. Patient not taking: Reported on 03/29/2023 11/10/21   [name], MD  insulin aspart (NOVOLOG) 100 UNIT/ML FlexPen Inject 8 Units into the skin 3 (three) times daily with meals. 04/02/23   Marinda Elk, MD  insulin glargine, 2 Unit Dial, (TOUJEO MAX SOLOSTAR) 300 UNIT/ML Solostar Pen Inject 50 Units into the skin daily. 04/02/23   Marinda Elk, MD  Insulin Pen Needle (B-D UF III MINI PEN NEEDLES) 31G X 5 MM MISC USE 1 PEN NEEDLE IN THE MORNING AND AT BEDTIME 04/05/23   [name], MD  metFORMIN (GLUCOPHAGE) 500 MG tablet Take 2 tablets (1,000 mg total) by mouth 2 (two) times daily with a meal. 04/02/23   Marinda Elk,  MD  rivaroxaban (XARELTO) 20 MG TABS tablet Take 1 tablet (20 mg total) by mouth daily. Patient not taking: Reported on 03/29/2023 10/18/22   [name], MD  rosuvastatin (CRESTOR) 40 MG tablet Take 1 tablet (40 mg total) by mouth daily. 04/08/23 07/07/23  [name], MD  thiamine (VITAMIN B-1) 100 MG tablet Take 100 mg by mouth daily.    [provider]  Vitamin D, Ergocalciferol, (DRISDOL) 1.25 MG (50000 UNIT) CAPS capsule Take 50,000 Units by mouth every Thursday.    [provider]      Allergies    Bactrim [sulfamethoxazole-trimethoprim]    Review  of Systems   Review of Systems  Physical Exam Updated Vital Signs BP (!) 154/90   Pulse 75   Temp 98.7 F (37.1 C) (Oral)   Resp (!) 23   SpO2 97%  Physical Exam Vitals and nursing note reviewed.  Constitutional:      General: He is not in acute distress.    Appearance: He is well-developed.     Comments: On 2 L nasal cannula.  Does not appear to be any significant respiratory distress.  When asked questions he states he does not want to talk about it and tells me to talk to his wife Malachi Bonds.  HENT:     Head: Normocephalic and atraumatic.     Right Ear: External ear normal.     Left Ear: External ear normal.     Nose: Nose normal.  Eyes:     Extraocular Movements: Extraocular movements intact.     Conjunctiva/sclera: Conjunctivae normal.     Pupils: Pupils are equal, round, and reactive to light.  Cardiovascular:     Rate and Rhythm: Normal rate and regular rhythm.     Heart sounds: Murmur heard.  Pulmonary:     Effort: Pulmonary effort is normal. No respiratory distress.     Comments: On 2 L nasal cannula.  Mild expiratory rhonchi Abdominal:     General: There is no distension.     Palpations: Abdomen is soft. There is no mass.     Tenderness: There is no abdominal tenderness. There is no guarding.  Musculoskeletal:     Cervical back: Normal range of motion and neck supple.     Right lower leg: Edema present.     Left lower leg: Edema present.  Skin:    General: Skin is warm and dry.  Neurological:     Mental Status: He is alert. Mental status is at baseline.  Psychiatric:        Mood and Affect: Mood normal.        Behavior: Behavior normal.     ED Results / Procedures / Treatments   Labs (all labs ordered are listed, but only abnormal results are displayed) Labs Reviewed - No data to display  EKG EKG Interpretation  Date/Time:  Tuesday April 26 2023 14:20:37 EDT Ventricular Rate:  82 PR Interval:  287 QRS Duration: 88 QT Interval:  438 QTC  Calculation: 496 R Axis:   23 Text Interpretation: Sinus rhythm Multiform ventricular premature complexes Prolonged PR interval Probable left atrial enlargement Abnormal R-wave progression, early transition Borderline T abnormalities, lateral leads Borderline prolonged QT interval Confirmed by Vonita Moss [phone]) on 04/26/2023 2:32:28 PM  Radiology No results found.  Procedures Procedures  {Document cardiac monitor, telemetry assessment procedure when appropriate:1}  Medications Ordered in ED Medications - No data to display  ED Course/ Medical Decision Making/ A&P Clinical Course as of 04/26/23  1610  Tue Apr 26, 2023  1602 Patient feels like he has not had any significant change after the DuoNeb. [RP]  1653 Signed out to Dr Lockie Mola.  [RP]    Clinical Course User Index [RP] Rondel Baton, MD   {   Click here for ABCD2, HEART and other calculatorsREFRESH Note before signing :1}                          Medical Decision Making Amount and/or Complexity of Data Reviewed Labs: ordered. Radiology: ordered.  Risk Prescription drug management.   ZACKERIE SARA is a 83 y.o. male with comorbidities that complicate the patient evaluation including atrial flutter on Xarelto, diabetes, COPD, mitral valve prolapse, and self-reported CHF presents emergency department shortness of breath and hypoxia.    Initial Ddx:  COPD exacerbation, heart failure exacerbation, MI, PE, pneumonia, URI  MDM/Course:  Feel the patient may be having a COPD exacerbation given his expiratory rhonchi.  However, does not have significant wheezing that I would expect to see with his new oxygen requirement.  With his Xarelto being held in the setting of his recent surgery we will obtain a D-dimer at this time and CTA if elevated.  Upon re-evaluation ***  This patient presents to the ED for concern of complaints listed in HPI, this involves an extensive number of treatment options, and is a complaint that  carries with it a high risk of complications and morbidity. Disposition including potential need for admission considered.   Dispo: {Disposition:28069}  Additional history obtained from {Additional History:28067} Records reviewed {Records Reviewed:28068} The following labs were independently interpreted: {labs interpreted:28064} and show {lab findings:28250} I independently reviewed the following imaging with scope of interpretation limited to determining acute life threatening conditions related to emergency care: {imaging interpreted:28065} and agree with the radiologist interpretation with the following exceptions: none I personally reviewed and interpreted cardiac monitoring: {cardiac monitoring:28251} I personally reviewed and interpreted the pt's EKG: see above for interpretation  I have reviewed the patients home medications and made adjustments as needed Consults: {Consultants:28063} Social Determinants of health:  ***   {Document critical care time when appropriate:1} {Document review of labs and clinical decision tools ie heart score, Chads2Vasc2 etc:1}  {Document your independent review of radiology images, and any outside records:1} {Document your discussion with family members, caretakers, and with consultants:1} {Document social determinants of health affecting pt's care:1} {Document your decision making why or why not admission, treatments were needed:1} Final Clinical Impression(s) / ED Diagnoses Final diagnoses:  None    Rx / DC Orders ED Discharge Orders     None

## 2023-04-26 NOTE — Assessment & Plan Note (Signed)
Was initially given a dose of steroids Symptoms less consistent with COPD no associated wheezing Chest imaging more consistent with bilateral pleural effusions contributing to worsening shortness of breath and hypoxia Given open wound we will hold off on steroids as this may worsen wound healing

## 2023-04-26 NOTE — Assessment & Plan Note (Signed)
-   Order Sensitive SSI   - continue home insulin but decreased to toujeo  40units,  -  check TSH and HgA1C  - Hold by mouth medications

## 2023-04-26 NOTE — Assessment & Plan Note (Signed)
Continue Crestor 40 mg a day

## 2023-04-26 NOTE — Assessment & Plan Note (Addendum)
-   Pt diagnosed with CHF based on presence of the following: , rales   JVD, cardiomegaly, Pulmonary edema on CXR, and   bilateral leg edema, DOE,  pleural effusion  With noted response to IV diuretic in ER  admit on telemetry,  cycle cardiac enzymes,    obtain serial ECG  to evaluate for ischemia as a cause of heart failure  monitor daily weight: There were no vitals filed for this visit. Last BNP BNP (last 3 results) Recent Labs    04/26/23 1441  BNP 364.1*      diurese with IV lasix and monitor orthostatics and creatinine to avoid over diuresis.  Order echogram to evaluate EF and valves   Suspect valvular disease is contributing. Repeat echogram Diuresis with Lasix 40 IV daily Would benefit from cardiology consult given significant valvular disease

## 2023-04-26 NOTE — ED Notes (Signed)
ED TO INPATIENT HANDOFF REPORT  ED Nurse Name and Phone #:  Edyth Gunnels RN [phone]  S Name/Age/Gender Jason [name] 82 y.o. male Room/Bed: WA18/WA18  Code Status   Code Status: Prior  Home/SNF/Other Home Patient oriented to: self, place, time, and situation Is this baseline? Yes   Triage Complete: Triage complete  Chief Complaint Acute diastolic CHF (congestive heart failure) (HCC) [I50.31]  Triage Note Patient coming from PCP via EMS with c/o low O2 saturation. Per EMS 86% on room air. Pt c/o SOB on exertion, states this is normal for him. Hx COPD and CHF. Currently on 2L nasal cannula at 95%. Denies any chest pain at this time.   Allergies Allergies  Allergen Reactions   Bactrim [Sulfamethoxazole-Trimethoprim] Rash    Level of Care/Admitting Diagnosis ED Disposition     ED Disposition  Admit   Condition  --   Comment  Hospital Area: [facility] [[id number]]  Level of Care: Telemetry [5]  Admit to tele based on following criteria: Acute CHF  May admit patient to [facility] or [facility] if equivalent level of care is available:: No  Covid Evaluation: Asymptomatic - no recent exposure (last 10 days) testing not required  Diagnosis: Acute diastolic CHF (congestive heart failure) [facility]) [[id number]]  Admitting Physician: [name] [[id number]]  Attending Physician: [name] [[id number]]  Certification:: I certify this patient will need inpatient services for at least 2 midnights  Estimated Length of Stay: 2          B Medical/Surgery History Past Medical History:  Diagnosis Date   Anxiety    Asthma    in past, no inhaler now   Benign neoplasm of colon    Bone infection (HCC)    Cancer (HCC)    bladder cancer   COPD (chronic obstructive pulmonary disease) (HCC)    Depression    Diverticulosis of colon (without mention of hemorrhage)    DJD (degenerative joint disease)    Esophageal reflux    Glaucoma    Hidradenitis    History of  BPH    History of gynecomastia    Unilateral   Hyperlipidemia    Irritable bowel syndrome    Memory loss    Mitral stenosis    mild-moderate MS 01/2016   MVP (mitral valve prolapse)    Obesity, unspecified    Pneumonia    Sleep apnea    does not use  cpap or bipap .. no study  ever   Type II or unspecified type diabetes mellitus without mention of complication, not stated as uncontrolled    Unspecified venous (peripheral) insufficiency    Past Surgical History:  Procedure Laterality Date   ACHILLES TENDON SURGERY Right 05/17/2019   Procedure: ACHILLES LENGTHENING/KIDNER;  Surgeon: Park Liter, DPM;  Location: [facility] OR;  Service: Podiatry;  Laterality: Right;   AMPUTATION  08/09/2012   Procedure: AMPUTATION DIGIT;  Surgeon: Nadara Mustard, MD;  Location: [facility] OR;  Service: Orthopedics;  Laterality: Right;  Amputation right Great Toe MTP Joint   AMPUTATION  10/17/2012   Procedure: AMPUTATION DIGIT;  Surgeon: Nadara Mustard, MD;  Location: [facility] OR;  Service: Orthopedics;  Laterality: Right;  Right Foot 3rd Toe Amputation at Metatarsophalangeal Joint   AMPUTATION Right 03/30/2023   Procedure: TRANSMETATARSAL AMPUTATION, RIGHT FOOT;  Surgeon: Pilar Plate, DPM;  Location: [facility] ORS;  Service: Podiatry;  Laterality: Right;   AMPUTATION TOE Right 05/17/2019   Procedure: AMPUTATION TOE Metatarsal Phalangeal Joint  FOURTH AND FIFTH, FILLETED TOE FLAP;  Surgeon: Park Liter, DPM;  Location: [facility];  Service: Podiatry;  Laterality: Right;   BUBBLE STUDY  08/12/2021   Procedure: BUBBLE STUDY;  Surgeon: Sande Rives, MD;  Location: [facility] ENDOSCOPY;  Service: Cardiovascular;;   CHOLECYSTECTOMY N/A 06/14/2018   Procedure: LAPAROSCOPIC CHOLECYSTECTOMY WITH INTRAOPERATIVE CHOLANGIOGRAM;  Surgeon: Rodman Pickle, MD;  Location: [facility]S;  Service: General;  Laterality: N/A;   CIRCUMCISION  06/2100   For Phimosis - Dr Marcello Fennel   COLON SURGERY     CYSTOSCOPY W/ RETROGRADES Bilateral  07/07/2018   Procedure: CYSTOSCOPY WITH BILATERAL RETROGRADE PYELOGRAM;  Surgeon: Crist Fat, MD;  Location: [facility];  Service: Urology;  Laterality: Bilateral;   KNEE ARTHROSCOPY     LAPAROSCOPIC LYSIS OF ADHESIONS  06/14/2018   Procedure: LAPAROSCOPIC LYSIS OF ADHESIONS;  Surgeon: Sheliah Hatch De Blanch, MD;  Location: [facility];  Service: General;;   LAPAROTOMY  07/20/11   LASER ABLATION  06/2009   Left Greater Saphenous vein -Dr Jenean Lindau OSTEOTOMY Right 05/17/2019   Procedure: METATARSLSECTOMY FOURTH DIGIT;  Surgeon: Park Liter, DPM;  Location: [facility];  Service: Podiatry;  Laterality: Right;   MULTIPLE TOOTH EXTRACTIONS     TEE WITHOUT CARDIOVERSION N/A 08/12/2021   Procedure: TRANSESOPHAGEAL ECHOCARDIOGRAM (TEE);  Surgeon: Sande Rives, MD;  Location: [facility] ENDOSCOPY;  Service: Cardiovascular;  Laterality: N/A;   TOE AMPUTATION  06/2009   x3Right foot second toe -Dr Lestine Box   TONSILLECTOMY     TRANSURETHRAL RESECTION OF BLADDER TUMOR N/A 07/07/2018   Procedure: TRANSURETHRAL RESECTION OF BLADDER TUMOR (TURBT) WITH POST OP INSTILLATION OF GEMCITABIN;  Surgeon: Crist Fat, MD;  Location: [facility];  Service: Urology;  Laterality: N/A;   WOUND DEBRIDEMENT Right 07/25/2019   Procedure: Right foot wound debridement with application of skin graft substitute;  Surgeon: Park Liter, DPM;  Location: [facility];  Service: Podiatry;  Laterality: Right;     A IV Location/Drains/Wounds Patient Lines/Drains/Airways Status     Active Line/Drains/Airways     Name Placement date Placement time Site Days   Peripheral IV 04/26/23 20 G 1" Anterior;Distal;Left;Upper Arm 04/26/23  1522  Arm  less than 1            Intake/Output Last 24 hours No intake or output data in the 24 hours ending 04/26/23 1943  Labs/Imaging Results for orders placed or performed during the hospital encounter of 04/26/23 (from the past 48 hour(s))  Resp panel by RT-PCR (RSV, Flu A&B, Covid)  Anterior Nasal Swab     Status: None   Collection Time: 04/26/23  2:40 PM   Specimen: Anterior Nasal Swab  Result Value Ref Range   SARS Coronavirus 2 by RT PCR NEGATIVE NEGATIVE    Comment: (NOTE) SARS-CoV-2 target nucleic acids are NOT DETECTED.  The SARS-CoV-2 RNA is generally detectable in upper respiratory specimens during the acute phase of infection. The lowest concentration of SARS-CoV-2 viral copies this assay can detect is 138 copies/mL. A negative result does not preclude SARS-Cov-2 infection and should not be used as the sole basis for treatment or other patient management decisions. A negative result may occur with  improper specimen collection/handling, submission of specimen other than nasopharyngeal swab, presence of viral mutation(s) within the areas targeted by this assay, and inadequate number of viral copies(<138 copies/mL). A negative result must be combined with clinical observations, patient history, and epidemiological information. The expected result is Negative.  Fact Sheet  for Patients:  BloggerCourse.com  Fact Sheet for Healthcare Providers:  SeriousBroker.it  This test is no t yet approved or cleared by the [place] FDA and  has been authorized for detection and/or diagnosis of SARS-CoV-2 by FDA under an Emergency Use Authorization (EUA). This EUA will remain  in effect (meaning this test can be used) for the duration of the COVID-19 declaration under Section 564(b)(1) of the Act, 21 U.S.C.section 360bbb-3(b)(1), unless the authorization is terminated  or revoked sooner.       Influenza A by PCR NEGATIVE NEGATIVE   Influenza B by PCR NEGATIVE NEGATIVE    Comment: (NOTE) The Xpert Xpress SARS-CoV-2/FLU/RSV plus assay is intended as an aid in the diagnosis of influenza from Nasopharyngeal swab specimens and should not be used as a sole basis for treatment. Nasal washings and aspirates are  unacceptable for Xpert Xpress SARS-CoV-2/FLU/RSV testing.  Fact Sheet for Patients: BloggerCourse.com  Fact Sheet for Healthcare Providers: SeriousBroker.it  This test is not yet approved or cleared by the [place] FDA and has been authorized for detection and/or diagnosis of SARS-CoV-2 by FDA under an Emergency Use Authorization (EUA). This EUA will remain in effect (meaning this test can be used) for the duration of the COVID-19 declaration under Section 564(b)(1) of the Act, 21 U.S.C. section 360bbb-3(b)(1), unless the authorization is terminated or revoked.     Resp Syncytial Virus by PCR NEGATIVE NEGATIVE    Comment: (NOTE) Fact Sheet for Patients: BloggerCourse.com  Fact Sheet for Healthcare Providers: SeriousBroker.it  This test is not yet approved or cleared by the [place] FDA and has been authorized for detection and/or diagnosis of SARS-CoV-2 by FDA under an Emergency Use Authorization (EUA). This EUA will remain in effect (meaning this test can be used) for the duration of the COVID-19 declaration under Section 564(b)(1) of the Act, 21 U.S.C. section 360bbb-3(b)(1), unless the authorization is terminated or revoked.  Performed at [facility], [address] [place], [place] [address]   Comprehensive metabolic panel     Status: Abnormal   Collection Time: 04/26/23  2:40 PM  Result Value Ref Range   Sodium 131 (L) 135 - 145 mmol/L   Potassium 4.5 3.5 - 5.1 mmol/L   Chloride 95 (L) 98 - 111 mmol/L   CO2 29 22 - 32 mmol/L   Glucose, Bld 74 70 - 99 mg/dL    Comment: Glucose reference range applies only to samples taken after fasting for at least 8 hours.   BUN 14 8 - 23 mg/dL   Creatinine, Ser 6.04 0.61 - 1.24 mg/dL   Calcium 8.6 (L) 8.9 - 10.3 mg/dL   Total Protein 7.4 6.5 - 8.1 g/dL   Albumin 3.4 (L) 3.5 - 5.0 g/dL   AST 24 15 -  41 U/L   ALT 22 0 - 44 U/L   Alkaline Phosphatase 42 38 - 126 U/L   Total Bilirubin 0.8 0.3 - 1.2 mg/dL   GFR, Estimated >54 >09 mL/min    Comment: (NOTE) Calculated using the CKD-EPI Creatinine Equation (2021)    Anion gap 7 5 - 15    Comment: Performed at [facility], [address] [place], [place] [address]  CBC     Status: Abnormal   Collection Time: 04/26/23  2:40 PM  Result Value Ref Range   WBC 6.9 4.0 - 10.5 K/uL   RBC 2.98 (L) 4.22 - 5.81 MIL/uL   Hemoglobin 9.4 (L) 13.0 - 17.0 g/dL  HCT 31.2 (L) 39.0 - 52.0 %   MCV 104.7 (H) 80.0 - 100.0 fL   MCH 31.5 26.0 - 34.0 pg   MCHC 30.1 30.0 - 36.0 g/dL   RDW 96.2 95.2 - 84.1 %   Platelets 264 150 - 400 K/uL   nRBC 0.0 0.0 - 0.2 %    Comment: Performed at [facility], [address] [place], [place] [address]  D-dimer, quantitative     Status: Abnormal   Collection Time: 04/26/23  2:40 PM  Result Value Ref Range   D-Dimer, Quant 1.53 (H) 0.00 - 0.50 ug/mL-FEU    Comment: (NOTE) At the manufacturer cut-off value of 0.5 g/mL FEU, this assay has a negative predictive value of 95-100%.This assay is intended for use in conjunction with a clinical pretest probability (PTP) assessment model to exclude pulmonary embolism (PE) and deep venous thrombosis (DVT) in outpatients suspected of PE or DVT. Results should be correlated with clinical presentation. Performed at [facility], [address] [place], [place] [address]   Troponin I (High Sensitivity)     Status: Abnormal   Collection Time: 04/26/23  2:40 PM  Result Value Ref Range   Troponin I (High Sensitivity) 31 (H) <18 ng/L    Comment: (NOTE) Elevated high sensitivity troponin I (hsTnI) values and significant  changes across serial measurements may suggest ACS but many other  chronic and acute conditions are known to elevate hsTnI results.  Refer to the "Links" section for chest pain algorithms and additional   guidance. Performed at [facility], [address], [place], [place] [address]   Brain natriuretic peptide     Status: Abnormal   Collection Time: 04/26/23  2:41 PM  Result Value Ref Range   B Natriuretic Peptide 364.1 (H) 0.0 - 100.0 pg/mL    Comment: Performed at [facility], [address], [place], [place] [address]  Blood gas, venous     Status: Abnormal   Collection Time: 04/26/23  3:20 PM  Result Value Ref Range   pH, Ven 7.3 7.25 - 7.43   pCO2, Ven 68 (H) 44 - 60 mmHg   pO2, Ven <31 (LL) 32 - 45 mmHg    Comment: CRITICAL RESULT CALLED TO, READ BACK BY AND VERIFIED WITH: H.HAMBLIN, RN AT 1559 ON 06.11.24 BY N.THOMPSON    Bicarbonate 33.5 (H) 20.0 - 28.0 mmol/L   Acid-Base Excess 4.8 (H) 0.0 - 2.0 mmol/L   O2 Saturation 31.7 %   Patient temperature 37.0     Comment: Performed at [facility], [address], [place], [place] [address]  Troponin I (High Sensitivity)     Status: Abnormal   Collection Time: 04/26/23  6:22 PM  Result Value Ref Range   Troponin I (High Sensitivity) 31 (H) <18 ng/L    Comment: (NOTE) Elevated high sensitivity troponin I (hsTnI) values and significant  changes across serial measurements may suggest ACS but many other  chronic and acute conditions are known to elevate hsTnI results.  Refer to the "Links" section for chest pain algorithms and additional  guidance. Performed at [facility], [address] [place], [place] [address]    CT Angio Chest PE W and/or Wo Contrast  Result Date: 04/26/2023 CLINICAL DATA:  Hypoxia with recent surgery. Low oxygen saturation. Shortness of breath on exertion. EXAM: CT ANGIOGRAPHY CHEST WITH CONTRAST TECHNIQUE: Multidetector CT imaging of the chest was performed using the standard protocol during  bolus administration of intravenous contrast. Multiplanar CT image reconstructions and MIPs were obtained to evaluate the vascular  anatomy. RADIATION DOSE REDUCTION: This exam was performed according to the departmental dose-optimization program which includes automated exposure control, adjustment of the mA and/or kV according to patient size and/or use of iterative reconstruction technique. CONTRAST:  80mL OMNIPAQUE IOHEXOL 350 MG/ML SOLN COMPARISON:  Chest radiograph 04/26/2023 FINDINGS: Cardiovascular: Technically adequate study with moderately good opacification of the central and segmental pulmonary arteries. No focal filling defects are identified. No evidence of significant pulmonary embolus. Cardiac enlargement. No pericardial effusion. Normal caliber thoracic aorta. No aortic dissection. Calcification of the aorta and coronary arteries. Mediastinum/Nodes: Thyroid gland is unremarkable. Esophagus is decompressed. Mediastinal lymphadenopathy with numerous lymph nodes throughout all quadrants of the mediastinum and extending up into the supraclavicular base of neck. Largest right paratracheal nodes measure up to about 1.7 cm short axis dimension. Etiology is nonspecific, possibly reactive or inflammatory. Lymphoproliferative process is not excluded. Clinical correlation is recommended. Lungs/Pleura: Moderate bilateral pleural effusions with bilateral basilar consolidation or atelectasis. No pneumothorax. Upper Abdomen: No acute abnormalities demonstrated in the visualized upper abdomen. Musculoskeletal: Degenerative changes in the spine. No acute bony abnormalities. Review of the MIP images confirms the above findings. IMPRESSION: 1. No evidence of significant pulmonary embolus. 2. Cardiac enlargement. 3. Aortic atherosclerosis. 4. Moderate bilateral pleural effusions with bilateral basilar consolidation, possibly pneumonia or compressive atelectasis. Electronically Signed   By: Burman Nieves M.D.   On: 04/26/2023 18:21   DG Chest 2 View  Result Date: 04/26/2023 CLINICAL DATA:  Cough and hypoxia. Low oxygen saturation. Shortness  of breath on exertion. EXAM: CHEST - 2 VIEW COMPARISON:  10/14/2016 FINDINGS: Shallow inspiration. Mild cardiac enlargement. Bilateral perihilar and basilar infiltrates with small bilateral pleural effusions, progressing since prior study. No pneumothorax. Mediastinal contours appear intact. IMPRESSION: Cardiac enlargement with small bilateral pleural effusions and perihilar/basilar infiltrates, likely edema although pneumonia could have this appearance as well. Electronically Signed   By: Burman Nieves M.D.   On: 04/26/2023 16:25    Pending Labs Unresulted Labs (From admission, onward)     Start     Ordered   04/27/23 0500  Prealbumin  Tomorrow morning,   R        04/26/23 1853   04/26/23 1855  CK  Add-on,   AD        04/26/23 1854   04/26/23 1855  Magnesium  Add-on,   AD        04/26/23 1854   04/26/23 1855  Phosphorus  Add-on,   AD        04/26/23 1854   04/26/23 1855  Creatinine, urine, random  Once,   URGENT        04/26/23 1854   04/26/23 1855  Osmolality  Add-on,   AD        04/26/23 1854   04/26/23 1855  Osmolality, urine  Once,   URGENT        04/26/23 1854   04/26/23 1855  TSH  Add-on,   AD        04/26/23 1854   04/26/23 1855  Sodium, urine, random  Once,   URGENT        04/26/23 1854   04/26/23 1855  Protime-INR  Once,   STAT       Question:  Release to patient  Answer:  Immediate   04/26/23 1854   04/26/23 1855  Vitamin B12  (Anemia Panel (PNL))  Once,  URGENT        04/26/23 1854   04/26/23 1855  Folate  (Anemia Panel (PNL))  Once,   URGENT        04/26/23 1854   04/26/23 1855  Iron and TIBC  (Anemia Panel (PNL))  Once,   URGENT        04/26/23 1854   04/26/23 1855  Ferritin  (Anemia Panel (PNL))  Once,   URGENT        04/26/23 1854   04/26/23 1855  Reticulocytes  (Anemia Panel (PNL))  Once,   URGENT        04/26/23 1854   04/26/23 1855  Sedimentation rate  Add-on,   AD        04/26/23 1854   04/26/23 1855  C-reactive protein  Add-on,   AD        04/26/23  1854   04/26/23 1854  Hemoglobin A1c  Add-on,   AD       Comments: To assess prior glycemic control    04/26/23 1853   04/26/23 1854  Procalcitonin  Add-on,   AD       References:    Procalcitonin Lower Respiratory Tract Infection AND Sepsis Procalcitonin Algorithm   04/26/23 1853            Vitals/Pain Today's Vitals   04/26/23 1630 04/26/23 1810 04/26/23 1850 04/26/23 1930  BP: (!) 144/68   (!) 151/68  Pulse: 77  84 82  Resp: (!) 24  (!) 24 (!) 28  Temp:  97.6 F (36.4 C)    TempSrc:  Oral    SpO2: 97%  97% 96%  PainSc:        Isolation Precautions No active isolations  Medications Medications  ipratropium-albuterol (DUONEB) 0.5-2.5 (3) MG/3ML nebulizer solution 3 mL (3 mLs Nebulization Given 04/26/23 1521)  methylPREDNISolone sodium succinate (SOLU-MEDROL) 125 mg/2 mL injection 125 mg (125 mg Intravenous Given 04/26/23 1614)  ipratropium-albuterol (DUONEB) 0.5-2.5 (3) MG/3ML nebulizer solution 3 mL (3 mLs Nebulization Given 04/26/23 1614)  furosemide (LASIX) injection 40 mg (40 mg Intravenous Given 04/26/23 1809)  iohexol (OMNIPAQUE) 350 MG/ML injection 80 mL (80 mLs Intravenous Contrast Given 04/26/23 1707)    Mobility non-ambulatory     Focused Assessments Pulmonary Assessment Handoff:  Lung sounds:   O2 Device: Nasal Cannula O2 Flow Rate (L/min): 1 L/min    R Recommendations: See Admitting Provider Note  Report given to:   Additional Notes:

## 2023-04-26 NOTE — Assessment & Plan Note (Addendum)
Patient followed by podiatry Examined wound appears to be similar to when he was seen by podiatrist earlier. Continue with wound dressing will need to follow-up as an outpatient At this point no evidence of acute infection Check sed rate and CRP  On Augmentin and doxycycline presumably for recent foot infection requiring amputation. Continue change to Unasyn/doxycycline IV while p.o. intake diminished

## 2023-04-26 NOTE — ED Provider Notes (Signed)
Patient signed out to me at 5 PM.  Here with new oxygen requirement, respiratory failure.  History of COPD and heart failure.  Overall suspicion is for multifactorial shortness of breath and hypoxia from volume overload and COPD exacerbation.  Given steroids, breathing treatment, Lasix.  Awaiting CT scan to evaluate for pneumonia/PE  Overall CT scan consistent with volume overload.  Clinically I do not think he has pneumonia.  Will admit for further care to hospitalist.   Virgina Norfolk, DO 04/26/23 1844

## 2023-04-26 NOTE — Assessment & Plan Note (Signed)
In the setting of fluid overload  Check urine electrolytes

## 2023-04-26 NOTE — Assessment & Plan Note (Addendum)
Currently Xarelto on hold.  Possibly secondary to need for recent foot Surgery Not on beta-blocker

## 2023-04-26 NOTE — ED Triage Notes (Addendum)
Patient coming from PCP via EMS with c/o low O2 saturation. Per EMS 86% on room air. Pt c/o SOB on exertion, states this is normal for him. Hx COPD and CHF. Currently on 2L nasal cannula at 95%. Denies any chest pain at this time.

## 2023-04-26 NOTE — Subjective & Objective (Signed)
History of COPD and heart failure presented for worsening shortness of breath found to be hypoxic 86% on room air  Endorses shortness of breath with exertion which is somewhat normal for him no chest pain  Of note patient have had recent transmetatarsal amputation of his right foot followed by podiatry

## 2023-04-26 NOTE — ED Notes (Signed)
Pt resting in bed with no acute distress noted at this time.  

## 2023-04-26 NOTE — Assessment & Plan Note (Signed)
Elevated INR at 2.3 unclear etiology will need to calrify if pt is on anticoagulation

## 2023-04-27 ENCOUNTER — Inpatient Hospital Stay (HOSPITAL_COMMUNITY): Payer: Medicare HMO

## 2023-04-27 DIAGNOSIS — I050 Rheumatic mitral stenosis: Secondary | ICD-10-CM

## 2023-04-27 DIAGNOSIS — I340 Nonrheumatic mitral (valve) insufficiency: Secondary | ICD-10-CM | POA: Diagnosis not present

## 2023-04-27 DIAGNOSIS — I5031 Acute diastolic (congestive) heart failure: Secondary | ICD-10-CM | POA: Diagnosis not present

## 2023-04-27 DIAGNOSIS — I342 Nonrheumatic mitral (valve) stenosis: Secondary | ICD-10-CM | POA: Diagnosis not present

## 2023-04-27 DIAGNOSIS — J9601 Acute respiratory failure with hypoxia: Secondary | ICD-10-CM | POA: Diagnosis not present

## 2023-04-27 LAB — IRON AND TIBC
Iron: 44 ug/dL — ABNORMAL LOW (ref 45–182)
Saturation Ratios: 14 % — ABNORMAL LOW (ref 17.9–39.5)
TIBC: 316 ug/dL (ref 250–450)
UIBC: 272 ug/dL

## 2023-04-27 LAB — COMPREHENSIVE METABOLIC PANEL
ALT: 24 U/L (ref 0–44)
AST: 21 U/L (ref 15–41)
Albumin: 3.6 g/dL (ref 3.5–5.0)
Alkaline Phosphatase: 46 U/L (ref 38–126)
Anion gap: 10 (ref 5–15)
BUN: 15 mg/dL (ref 8–23)
CO2: 30 mmol/L (ref 22–32)
Calcium: 8.7 mg/dL — ABNORMAL LOW (ref 8.9–10.3)
Chloride: 95 mmol/L — ABNORMAL LOW (ref 98–111)
Creatinine, Ser: 0.92 mg/dL (ref 0.61–1.24)
GFR, Estimated: >60 mL/min (ref >60)
Glucose, Bld: 204 mg/dL — ABNORMAL HIGH (ref 70–99)
Potassium: 4.7 mmol/L (ref 3.5–5.1)
Sodium: 135 mmol/L (ref 135–145)
Total Bilirubin: 1.1 mg/dL (ref 0.3–1.2)
Total Protein: 8 g/dL (ref 6.5–8.1)

## 2023-04-27 LAB — CBC
HCT: 33.7 % — ABNORMAL LOW (ref 39.0–52.0)
Hemoglobin: 10.3 g/dL — ABNORMAL LOW (ref 13.0–17.0)
MCH: 31.9 pg (ref 26.0–34.0)
MCHC: 30.6 g/dL (ref 30.0–36.0)
MCV: 104.3 fL — ABNORMAL HIGH (ref 80.0–100.0)
Platelets: 280 10*3/uL (ref 150–400)
RBC: 3.23 MIL/uL — ABNORMAL LOW (ref 4.22–5.81)
RDW: 14.6 % (ref 11.5–15.5)
WBC: 7.7 10*3/uL (ref 4.0–10.5)
nRBC: 0 % (ref 0.0–0.2)

## 2023-04-27 LAB — MRSA NEXT GEN BY PCR, NASAL: MRSA by PCR Next Gen: NOT DETECTED

## 2023-04-27 LAB — ECHOCARDIOGRAM COMPLETE
AR max vel: 1.53 cm2
AV Area VTI: 1.8 cm2
AV Area mean vel: 1.5 cm2
AV Mean grad: 7 mmHg
AV Peak grad: 13.1 mmHg
Ao pk vel: 1.81 m/s
Height: 80 in
MV VTI: 0.62 cm2
S' Lateral: 2.7 cm
Weight: 4698.44 oz

## 2023-04-27 LAB — OSMOLALITY: Osmolality: 300 mOsm/kg — ABNORMAL HIGH (ref 275–295)

## 2023-04-27 LAB — HEMOGLOBIN A1C
Hgb A1c MFr Bld: 5.8 % — ABNORMAL HIGH (ref 4.8–5.6)
Mean Plasma Glucose: 119.76 mg/dL

## 2023-04-27 LAB — FERRITIN: Ferritin: 96 ng/mL (ref 24–336)

## 2023-04-27 LAB — GLUCOSE, CAPILLARY
Glucose-Capillary: 223 mg/dL — ABNORMAL HIGH (ref 70–99)
Glucose-Capillary: 191 mg/dL — ABNORMAL HIGH (ref 70–99)
Glucose-Capillary: 215 mg/dL — ABNORMAL HIGH (ref 70–99)
Glucose-Capillary: 164 mg/dL — ABNORMAL HIGH (ref 70–99)
Glucose-Capillary: 213 mg/dL — ABNORMAL HIGH (ref 70–99)

## 2023-04-27 LAB — OSMOLALITY, URINE: Osmolality, Ur: 175 mOsm/kg — ABNORMAL LOW (ref 300–900)

## 2023-04-27 LAB — PREALBUMIN: Prealbumin: 23 mg/dL (ref 18–38)

## 2023-04-27 LAB — MAGNESIUM: Magnesium: 2.2 mg/dL (ref 1.7–2.4)

## 2023-04-27 LAB — PHOSPHORUS: Phosphorus: 5.3 mg/dL — ABNORMAL HIGH (ref 2.5–4.6)

## 2023-04-27 LAB — TSH: TSH: 0.771 u[IU]/mL (ref 0.350–4.500)

## 2023-04-27 LAB — VITAMIN B12: Vitamin B-12: 250 pg/mL (ref 180–914)

## 2023-04-27 LAB — TROPONIN I (HIGH SENSITIVITY): Troponin I (High Sensitivity): 24 ng/L — ABNORMAL HIGH (ref <18)

## 2023-04-27 LAB — C-REACTIVE PROTEIN: CRP: <0.5 mg/dL (ref <1.0)

## 2023-04-27 MED ORDER — RIVAROXABAN 10 MG PO TABS
20.0000 mg | ORAL_TABLET | Freq: Every day | ORAL | Status: DC
  Administered 2023-04-27 – 2023-05-09 (×13): 20 mg via ORAL
  Filled 2023-04-27: qty 1
  Filled 2023-04-27: qty 2
  Filled 2023-04-27 (×5): qty 1
  Filled 2023-04-27: qty 2
  Filled 2023-04-27 (×2): qty 1
  Filled 2023-04-27 (×2): qty 2
  Filled 2023-04-27: qty 1

## 2023-04-27 MED ORDER — AMOXICILLIN-POT CLAVULANATE 875-125 MG PO TABS
1.0000 | ORAL_TABLET | Freq: Two times a day (BID) | ORAL | Status: AC
  Administered 2023-04-27 – 2023-05-05 (×18): 1 via ORAL
  Filled 2023-04-27 (×19): qty 1

## 2023-04-27 MED ORDER — ADULT MULTIVITAMIN W/MINERALS CH
1.0000 | ORAL_TABLET | Freq: Every day | ORAL | Status: DC
  Administered 2023-04-28 – 2023-05-10 (×13): 1 via ORAL
  Filled 2023-04-27 (×13): qty 1

## 2023-04-27 MED ORDER — ORAL CARE MOUTH RINSE: 15.0000 mL | OROMUCOSAL | Status: DC | PRN

## 2023-04-27 MED ORDER — INSULIN ASPART 100 UNIT/ML IJ SOLN
0.0000 [IU] | INTRAMUSCULAR | Status: DC
  Administered 2023-04-27 (×2): 2 [IU] via SUBCUTANEOUS
  Administered 2023-04-27 (×2): 3 [IU] via SUBCUTANEOUS
  Administered 2023-04-28: 2 [IU] via SUBCUTANEOUS

## 2023-04-27 MED ORDER — IPRATROPIUM-ALBUTEROL 0.5-2.5 (3) MG/3ML IN SOLN
3.0000 mL | Freq: Three times a day (TID) | RESPIRATORY_TRACT | Status: DC
  Administered 2023-04-28: 3 mL via RESPIRATORY_TRACT
  Filled 2023-04-27 (×3): qty 3

## 2023-04-27 MED ORDER — DOXYCYCLINE HYCLATE 100 MG PO TABS
100.0000 mg | ORAL_TABLET | Freq: Two times a day (BID) | ORAL | Status: AC
  Administered 2023-04-27 – 2023-05-05 (×18): 100 mg via ORAL
  Filled 2023-04-27 (×18): qty 1

## 2023-04-27 MED ORDER — FUROSEMIDE 10 MG/ML IJ SOLN
40.0000 mg | Freq: Two times a day (BID) | INTRAMUSCULAR | Status: DC
  Administered 2023-04-27 – 2023-04-29 (×5): 40 mg via INTRAVENOUS
  Filled 2023-04-27 (×5): qty 4

## 2023-04-27 NOTE — Progress Notes (Signed)
Pt removed iv access. Iv team consulted, need for iv reinsertion explained. pt refused; pt states "willing to flight" Iv antibiotics not fully consumed.

## 2023-04-27 NOTE — [place] FL2 (Signed)
[place] MEDICAID FL2 LEVEL OF CARE FORM     IDENTIFICATION  Patient Name: Jason [name] Birthdate: 05-May-1941 Sex: male Admission Date (Current Location): 04/26/2023  [facility] and [place] Number:  Producer, television/film/video and Address:  [facility],  [address] [place], [place] [address]      Provider Number: [id number]  Attending Physician Name and Address:  Osvaldo Shipper, MD  Relative Name and Phone Number:  Andersson, Larrabee (Spouse) [phone] (Mobile)    Current Level of Care: Hospital Recommended Level of Care: Skilled Nursing Facility Prior Approval Number:    Date Approved/Denied:   PASRR Number: [id number]  Discharge Plan: SNF    Current Diagnoses: Patient Active Problem List   Diagnosis Date Noted   Acute diastolic CHF (congestive heart failure) (HCC) 04/26/2023   Acute respiratory failure with hypoxia (HCC) 04/26/2023   Status post amputation of right foot (HCC) 04/26/2023   COPD (chronic obstructive pulmonary disease) (HCC) 04/26/2023   Normocytic anemia 04/26/2023   Hyponatremia 04/26/2023   Elevated INR 04/26/2023   Subacute osteomyelitis of right foot (HCC) 03/30/2023   Diabetic ulcer of right midfoot associated with type 2 diabetes mellitus, with muscle involvement without evidence of necrosis (HCC) 03/29/2023   Gangrene of right foot (HCC) 03/29/2023   Right foot infection 03/29/2023   Encounter for general adult medical examination with abnormal findings 03/19/2022   Insulin-requiring or dependent type II diabetes mellitus (HCC) 03/16/2022   Atrial flutter (HCC) 08/02/2021   Nonrheumatic mitral valve stenosis 01/27/2021   Atherosclerosis of aorta (HCC) 09/30/2020   Abnormal electrocardiogram (ECG) (EKG) 09/30/2020   Thiamine deficiency 07/04/2020   Ulcer of right foot with necrosis of bone (HCC)    Malignant neoplasm of urinary bladder (HCC) 07/11/2018   Chronic idiopathic constipation 06/12/2018   Complex sleep apnea syndrome 01/12/2016    OSA (obstructive sleep apnea) 12/18/2015   Benign prostatic hyperplasia 02/18/2014   PAD (peripheral artery disease) (HCC) 02/18/2014   DEGENERATIVE JOINT DISEASE 02/20/2008   Diabetes mellitus type 2 with atherosclerosis of arteries of extremities (HCC) 01/26/2008   Hyperlipidemia 01/26/2008   Mitral valve disorder 01/26/2008   GERD 01/26/2008   Irritable bowel syndrome 01/26/2008    Orientation RESPIRATION BLADDER Height & Weight     Self, Time, Situation, Place  O2 (3L) Continent Weight: 293 lb 10.4 oz (133.2 kg) Height:  6\' 8"  (203.2 cm)  BEHAVIORAL SYMPTOMS/MOOD NEUROLOGICAL BOWEL NUTRITION STATUS      Continent Diet (Heart Healthy)  AMBULATORY STATUS COMMUNICATION OF NEEDS Skin   Limited Assist Verbally Surgical wounds (incision Right Foot)                       Personal Care Assistance Level of Assistance  Bathing, Feeding, Dressing Bathing Assistance: Limited assistance Feeding assistance: Independent Dressing Assistance: Limited assistance     Functional Limitations Info  Sight, Hearing, Speech Sight Info: Adequate Hearing Info: Adequate Speech Info: Adequate    SPECIAL CARE FACTORS FREQUENCY  PT (By licensed PT), OT (By licensed OT)     PT Frequency: 5 x a week OT Frequency: 5 x a week            Contractures Contractures Info: Not present    Additional Factors Info  Code Status, Allergies Code Status Info: full Allergies Info: Bactrim (sulfamethoxazole-trimethoprim)           Current Medications (04/27/2023):  This is the current hospital active medication list Current Facility-Administered Medications  Medication Dose Route Frequency  Provider Last Rate Last Admin   0.9 %  sodium chloride infusion  250 mL Intravenous PRN [name], MD       acetaminophen (TYLENOL) tablet 650 mg  650 mg Oral Q6H PRN Doutova, Anastassia, MD       Or   acetaminophen (TYLENOL) suppository 650 mg  650 mg Rectal Q6H PRN Doutova, Anastassia, MD        albuterol (PROVENTIL) (2.5 MG/3ML) 0.083% nebulizer solution 2.5 mg  2.5 mg Nebulization Q2H PRN Doutova, Anastassia, MD       amoxicillin-clavulanate (AUGMENTIN) 875-125 MG per tablet 1 tablet  1 tablet Oral Q12H Osvaldo Shipper, MD   1 tablet at 04/27/23 1210   bisacodyl (DULCOLAX) suppository 10 mg  10 mg Rectal Daily PRN [name], MD       docusate sodium (COLACE) capsule 100 mg  100 mg Oral BID Adela Glimpse, Anastassia, MD   100 mg at 04/27/23 0912   doxycycline (VIBRA-TABS) tablet 100 mg  100 mg Oral Q12H Len Childs T, RPH   100 mg at 04/27/23 1210   furosemide (LASIX) injection 40 mg  40 mg Intravenous BID [name], MD       guaiFENesin (MUCINEX) 12 hr tablet 600 mg  600 mg Oral BID [name], MD   600 mg at 04/27/23 0910   HYDROcodone-acetaminophen (NORCO/VICODIN) 5-325 MG per tablet 1-2 tablet  1-2 tablet Oral Q4H PRN Doutova, Anastassia, MD       insulin aspart (novoLOG) injection 0-9 Units  0-9 Units Subcutaneous Q4H Luiz Iron, NP   3 Units at 04/27/23 1210   insulin glargine-yfgn (SEMGLEE) injection 40 Units  40 Units Subcutaneous Daily Doutova, Jonny Ruiz, MD   40 Units at 04/27/23 0910   ipratropium-albuterol (DUONEB) 0.5-2.5 (3) MG/3ML nebulizer solution 3 mL  3 mL Nebulization TID Osvaldo Shipper, MD       [START ON 04/28/2023] multivitamin with minerals tablet 1 tablet  1 tablet Oral Daily Osvaldo Shipper, MD       ondansetron [facility]) tablet 4 mg  4 mg Oral Q6H PRN [name], MD       Or   ondansetron (ZOFRAN) injection 4 mg  4 mg Intravenous Q6H PRN Doutova, Anastassia, MD       Oral care mouth rinse  15 mL Mouth Rinse PRN Doutova, Anastassia, MD       polyethylene glycol (MIRALAX / GLYCOLAX) packet 17 g  17 g Oral Daily PRN Doutova, Anastassia, MD       polyethylene glycol (MIRALAX / GLYCOLAX) packet 17 g  17 g Oral Daily Doutova, Anastassia, MD   17 g at 04/27/23 0910   rivaroxaban (XARELTO) tablet 20 mg  20 mg Oral Q supper  Osvaldo Shipper, MD       rosuvastatin (CRESTOR) tablet 40 mg  40 mg Oral Daily Doutova, Anastassia, MD   40 mg at 04/27/23 0909   senna (SENOKOT) tablet 8.6 mg  1 tablet Oral BID Doutova, Anastassia, MD   8.6 mg at 04/27/23 0909   sodium chloride flush (NS) 0.9 % injection 3 mL  3 mL Intravenous Q12H Doutova, Anastassia, MD   3 mL at 04/27/23 0033   sodium chloride flush (NS) 0.9 % injection 3 mL  3 mL Intravenous PRN Doutova, Anastassia, MD       thiamine (VITAMIN B1) tablet 100 mg  100 mg Oral Daily Doutova, Anastassia, MD   100 mg at 04/27/23 1610     Discharge Medications: Please see discharge summary  for a list of discharge medications.  Relevant Imaging Results:  Relevant Lab Results:   Additional Information SSN:[id number]  Valentina Shaggy Acel Natzke, LCSW

## 2023-04-27 NOTE — Progress Notes (Signed)
Patient refused EKG.

## 2023-04-27 NOTE — Consult Note (Addendum)
WOC Nurse Consult Note: Reason for Consult: Consult requested for right foot wound.  Performed remotely after review of progress notes and photos in the EMR.  Wound type: Right anterior foot where previous amputation surgery was performed 5/15 is dark red and moist.  Pt is followed as an outpatient by Dr Verna Czech of the podiatry team and was recently seen in the office for a wound assessment on 6/7. They ordered Betadine dressings Q day.  Dressing procedure/placement/frequency: Continue present plan of care as previously ordered by podiatry.  Topical treatment orders provided for bedside nurses to perform as follows: Paint right foot with Betadine and dry dressing Q day. Pt should follow-up with podiatry team after discharge for further plan of care.  Please re-consult if further assistance is needed.  Thank-you,  Cammie Mcgee MSN, RN, CWOCN, [place], CNS [phone]

## 2023-04-27 NOTE — TOC Initial Note (Signed)
Transition of Care [facility]) - Initial/Assessment Note    Patient Details  Name: Jason [name] MRN: [id number] Date of Birth: 1941-03-19  Transition of Care [facility]) CM/SW Contact:    Larrie Kass, LCSW Phone Number: 04/27/2023, 4:29 PM  Clinical Narrative:                 CSW spoke with pt's spouse Bryan Goin, regarding rec for SNF placement. Pt's spouse reports she thinks it would be a good idea. Pt's wife is agreeable to SNF placement. Pt's wife reports she needs him to get stronger to return home. Pt's wife would like pt to stay in [place]. CSW explained the process, pt will need insurance authorization. CSW to fax pt out for SNF. TOC to follow.   Expected Discharge Plan: Skilled Nursing Facility Barriers to Discharge: Continued Medical Work up   Patient Goals and CMS Choice Patient states their goals for this hospitalization and ongoing recovery are:: SNF to get stronger          Expected Discharge Plan and Services       Living arrangements for the past 2 months: Single Family Home                                      Prior Living Arrangements/Services Living arrangements for the past 2 months: Single Family Home Lives with:: Self, Spouse Patient language and need for interpreter reviewed:: Yes        Need for Family Participation in Patient Care: Yes (Comment) Care giver support system in place?: Yes (comment) Current home services: DME Criminal Activity/Legal Involvement Pertinent to Current Situation/Hospitalization: No - Comment as needed  Activities of Daily Living Home Assistive Devices/Equipment: Eyeglasses    Permission Sought/Granted                  Emotional Assessment Appearance:: Appears stated age     Orientation: : Oriented to Self   Psych Involvement: No (comment)  Admission diagnosis:  Acute respiratory failure with hypoxia (HCC) [J96.01] Acute diastolic CHF (congestive heart failure) (HCC) [I50.31] Patient  Active Problem List   Diagnosis Date Noted   Acute diastolic CHF (congestive heart failure) (HCC) 04/26/2023   Acute respiratory failure with hypoxia (HCC) 04/26/2023   Status post amputation of right foot (HCC) 04/26/2023   COPD (chronic obstructive pulmonary disease) (HCC) 04/26/2023   Normocytic anemia 04/26/2023   Hyponatremia 04/26/2023   Elevated INR 04/26/2023   Subacute osteomyelitis of right foot (HCC) 03/30/2023   Diabetic ulcer of right midfoot associated with type 2 diabetes mellitus, with muscle involvement without evidence of necrosis (HCC) 03/29/2023   Gangrene of right foot (HCC) 03/29/2023   Right foot infection 03/29/2023   Encounter for general adult medical examination with abnormal findings 03/19/2022   Insulin-requiring or dependent type II diabetes mellitus (HCC) 03/16/2022   Atrial flutter (HCC) 08/02/2021   Nonrheumatic mitral valve stenosis 01/27/2021   Atherosclerosis of aorta (HCC) 09/30/2020   Abnormal electrocardiogram (ECG) (EKG) 09/30/2020   Thiamine deficiency 07/04/2020   Ulcer of right foot with necrosis of bone (HCC)    Malignant neoplasm of urinary bladder (HCC) 07/11/2018   Chronic idiopathic constipation 06/12/2018   Complex sleep apnea syndrome 01/12/2016   OSA (obstructive sleep apnea) 12/18/2015   Benign prostatic hyperplasia 02/18/2014   PAD (peripheral artery disease) (HCC) 02/18/2014   DEGENERATIVE JOINT DISEASE 02/20/2008   Diabetes mellitus type 2  with atherosclerosis of arteries of extremities (HCC) 01/26/2008   Hyperlipidemia 01/26/2008   Mitral valve disorder 01/26/2008   GERD 01/26/2008   Irritable bowel syndrome 01/26/2008   PCP:  [name], MD Pharmacy:   [facility] [address] [place] - [address] AT [address] [place] [address] [place] [address] Phone: [phone] Fax: [phone]     Social Determinants of Health (SDOH) Social History: SDOH Screenings   Food Insecurity: No Food  Insecurity (04/26/2023)  Housing: Medium Risk (04/26/2023)  Transportation Needs: No Transportation Needs (04/26/2023)  Utilities: Not At Risk (04/26/2023)  Alcohol Screen: Low Risk  (06/01/2022)  Depression (PHQ2-9): Low Risk  (06/01/2022)  Financial Resource Strain: Low Risk  (06/01/2022)  Physical Activity: Sufficiently Active (06/01/2022)  Social Connections: Socially Integrated (06/01/2022)  Stress: Stress Concern Present (06/01/2022)  Tobacco Use: Medium Risk (04/26/2023)   SDOH Interventions:     Readmission Risk Interventions     No data to display

## 2023-04-27 NOTE — Progress Notes (Signed)
Consulted to restart I.V. placed 2 hours ago after patient pulled out. Patient appears confused and is refusing to allow I.V. to be restarted. RN at bedside.

## 2023-04-27 NOTE — Progress Notes (Signed)
Heart Failure Navigator Progress Note  Assessed for Heart & Vascular TOC clinic readiness.  Patient does not meet criteria due to EF 60-65%, has a scheduled [facility] appointment with [name] on 05/09/2023. .   Navigator will sign off at this time. Rhae Hammock, BSN, RN Heart Failure Teacher, adult education Only

## 2023-04-27 NOTE — Plan of Care (Signed)
  Problem: Fluid Volume: Goal: Ability to maintain a balanced intake and output will improve Outcome: Progressing   Problem: Education: Goal: Ability to demonstrate management of disease process will improve Outcome: Progressing Goal: Ability to verbalize understanding of medication therapies will improve Outcome: Progressing   Problem: Health Behavior/Discharge Planning: Goal: Ability to manage health-related needs will improve Outcome: Progressing

## 2023-04-27 NOTE — Progress Notes (Signed)
[facility] PROGRESS NOTE   ISEAH NORWICK ZOX:[id number] DOB: [birth year] DOA: 04/26/2023  PCP: [name], MD  Brief History/Interval Summary: 82 y.o. male with medical history significant of COPD,  DM2 sp right forefoot amputation, severe mitral stenosis and regurgitation, history of atrial flutter on Xarelto, HLD, bladder cancer, sleep apnea who presented with worsening shortness of breath.  He was noted to be hypoxic with saturations of 86% on room air.  Recently underwent transmetatarsal amputation of his right foot by podiatry.  Has been on Augmentin and doxycycline.  Patient was hospitalized for further management of his hypoxia and dyspnea.  Consultants: None  Procedures: None    Subjective/Interval History: Patient denies any chest pain nausea vomiting this morning.  Mentions that he is feeling better today compared to yesterday.    Assessment/Plan:  Acute respiratory failure with hypoxia/acute diastolic CHF CT scan showed bilateral pleural effusion with compressive atelectasis.  Thought to have acute diastolic CHF.  Was given diuretics in the emergency department.  Remains on furosemide once a day.  Monitor ins and outs and daily weights. Last echocardiogram is from 2022 when he was noted to have EF of 60 to 65% of the left ventricle.  Right ventricular systolic function was noted to be normal. BNP was noted to be 364.  Mild elevation in troponin is likely of no clinical significance. Procalcitonin level less than 0.1.  WBC was normal. Doubt pneumonia.  He is on antibiotics for his right foot. We will however do an echocardiogram to see if there has been any change in his cardiac function. Currently on 2 to 3 L of oxygen by nasal cannula.  Wean down as tolerated to maintain sats greater than 90%.  Atrial flutter Heart rate seems to be reasonably well-controlled.  Not noted to be on any rate limiting drugs currently.  Is on rivaroxaban which was placed on hold  for his foot surgery.  Surgery was several weeks ago.  Will resume his anticoagulation.  Severe mitral stenosis Has a known history of same.  Follow-up on echocardiogram.  Could be contributing to his presentation.  Followed by [name] with cardiology.  According to the last note from February patient has been noncompliant with therapy. Due to fluid overload and his history of severe valvular disease we will go ahead and consult cardiology to assist with management.  Status post transmetatarsal amputation right foot Seen by podiatry recently.  There was some concern for infection.  Surgery was a few weeks ago.  Patient was started on Augmentin and doxycycline by podiatry.  Can be changed to oral.  Follow-up with podiatry in the outpatient setting.  ESR was noted to be 50.  CRP less than 0.5.  Diabetes mellitus type 2 Monitor CBGs.  Continue with SSI.  Noted to be on glargine 40 units daily.  Hyperlipidemia Continue with statin.  History of COPD Respiratory status is stable. No evidence for COPD exacerbation.  Normocytic anemia Stable.  Hyponatremia Improved.  Obesity Estimated body mass index is 32.26 kg/m as calculated from the following:   Height as of this encounter: 6\' 8"  (2.032 m).   Weight as of this encounter: 133.2 kg.   DVT Prophylaxis: Will resume Xarelto Code Status: Full code Family Communication: Discussed with patient Disposition Plan: To be determined  Status is: Inpatient Remains inpatient appropriate because: Acute respiratory failure with hypoxia      Medications: Scheduled:  docusate sodium  100 mg Oral BID   doxycycline  100  mg Oral Q12H   furosemide  40 mg Intravenous Daily   guaiFENesin  600 mg Oral BID   insulin aspart  0-9 Units Subcutaneous Q4H   insulin glargine-yfgn  40 Units Subcutaneous Daily   ipratropium-albuterol  3 mL Nebulization TID   polyethylene glycol  17 g Oral Daily   rosuvastatin  40 mg Oral Daily   senna  1 tablet  Oral BID   sodium chloride flush  3 mL Intravenous Q12H   thiamine  100 mg Oral Daily   Continuous:  sodium chloride     ampicillin-sulbactam (UNASYN) IV 3 g (04/27/23 0913)   NFA:OZHYQM chloride, acetaminophen **OR** acetaminophen, albuterol, bisacodyl, HYDROcodone-acetaminophen, ondansetron **OR** ondansetron (ZOFRAN) IV, mouth rinse, polyethylene glycol, sodium chloride flush  Antibiotics: Anti-infectives (From admission, onward)    Start     Dose/Rate Route Frequency Ordered Stop   04/27/23 0015  doxycycline (VIBRAMYCIN) 100 mg in sodium chloride 0.9 % 250 mL IVPB        100 mg 125 mL/hr over 120 Minutes Intravenous Every 12 hours 04/26/23 2317     04/26/23 2100  Ampicillin-Sulbactam (UNASYN) 3 g in sodium chloride 0.9 % 100 mL IVPB        3 g 200 mL/hr over 30 Minutes Intravenous Every 6 hours 04/26/23 2015         Objective:  Vital Signs  Vitals:   04/27/23 0349 04/27/23 0612 04/27/23 0849 04/27/23 0854  BP: (!) 144/55  128/73   Pulse: 86  78   Resp: 18  18   Temp: 97.8 F (36.6 C)  97.8 F (36.6 C)   TempSrc: Oral  Oral   SpO2: 92%  98% 92%  Weight:  133.2 kg    Height:        Intake/Output Summary (Last 24 hours) at 04/27/2023 1044 Last data filed at 04/27/2023 1000 Gross per 24 hour  Intake 1072.48 ml  Output 2175 ml  Net -1102.52 ml   Filed Weights   04/26/23 2308 04/27/23 0612  Weight: 133.2 kg 133.2 kg    General appearance: Awake alert.  In no distress Resp: Normal effort at rest.  Crackles at the bases.  No wheezing or rhonchi. Cardio: S1-S2 is normal regular.  No S3-S4.  No rubs murmurs or bruit GI: Abdomen is soft.  Nontender nondistended.  Bowel sounds are present normal.  No masses organomegaly Extremities: No edema.  Full range of motion of lower extremities. Neurologic: Alert and oriented x3.  No focal neurological deficits.    Lab Results:  Data Reviewed: I have personally reviewed following labs and reports of the imaging  studies  CBC: Recent Labs  Lab 04/26/23 1440 04/27/23 0833  WBC 6.9 7.7  HGB 9.4* 10.3*  HCT 31.2* 33.7*  MCV 104.7* 104.3*  PLT 264 280    Basic Metabolic Panel: Recent Labs  Lab 04/26/23 1440 04/26/23 2000 04/27/23 0833  NA 131*  --  135  K 4.5  --  4.7  CL 95*  --  95*  CO2 29  --  30  GLUCOSE 74  --  204*  BUN 14  --  15  CREATININE 0.83  --  0.92  CALCIUM 8.6*  --  8.7*  MG  --  1.8 2.2  PHOS  --  4.4 5.3*    GFR: Estimated Creatinine Clearance: 98.8 mL/min (by C-G formula based on SCr of 0.92 mg/dL).  Liver Function Tests: Recent Labs  Lab 04/26/23 1440 04/27/23 0833  AST 24  21  ALT 22 24  ALKPHOS 42 46  BILITOT 0.8 1.1  PROT 7.4 8.0  ALBUMIN 3.4* 3.6     Coagulation Profile: Recent Labs  Lab 04/26/23 1855  INR 2.3*    Cardiac Enzymes: Recent Labs  Lab 04/26/23 2000  CKTOTAL 87     HbA1C: Recent Labs    04/26/23 2125  HGBA1C 5.8*    CBG: Recent Labs  Lab 04/27/23 0346 04/27/23 0727  GLUCAP 223* 191*    Thyroid Function Tests: Recent Labs    04/26/23 2125  TSH 0.771    Anemia Panel: Recent Labs    04/26/23 2125  VITAMINB12 250  FOLATE 18.5  FERRITIN 96  TIBC 316  IRON 44*  RETICCTPCT 6.8*    Recent Results (from the past 240 hour(s))  Resp panel by RT-PCR (RSV, Flu A&B, Covid) Anterior Nasal Swab     Status: None   Collection Time: 04/26/23  2:40 PM   Specimen: Anterior Nasal Swab  Result Value Ref Range Status   SARS Coronavirus 2 by RT PCR NEGATIVE NEGATIVE Final    Comment: (NOTE) SARS-CoV-2 target nucleic acids are NOT DETECTED.  The SARS-CoV-2 RNA is generally detectable in upper respiratory specimens during the acute phase of infection. The lowest concentration of SARS-CoV-2 viral copies this assay can detect is 138 copies/mL. A negative result does not preclude SARS-Cov-2 infection and should not be used as the sole basis for treatment or other patient management decisions. A negative result  may occur with  improper specimen collection/handling, submission of specimen other than nasopharyngeal swab, presence of viral mutation(s) within the areas targeted by this assay, and inadequate number of viral copies(<138 copies/mL). A negative result must be combined with clinical observations, patient history, and epidemiological information. The expected result is Negative.  Fact Sheet for Patients:  BloggerCourse.com  Fact Sheet for Healthcare Providers:  SeriousBroker.it  This test is no t yet approved or cleared by the [place] FDA and  has been authorized for detection and/or diagnosis of SARS-CoV-2 by FDA under an Emergency Use Authorization (EUA). This EUA will remain  in effect (meaning this test can be used) for the duration of the COVID-19 declaration under Section 564(b)(1) of the Act, 21 U.S.C.section 360bbb-3(b)(1), unless the authorization is terminated  or revoked sooner.       Influenza A by PCR NEGATIVE NEGATIVE Final   Influenza B by PCR NEGATIVE NEGATIVE Final    Comment: (NOTE) The Xpert Xpress SARS-CoV-2/FLU/RSV plus assay is intended as an aid in the diagnosis of influenza from Nasopharyngeal swab specimens and should not be used as a sole basis for treatment. Nasal washings and aspirates are unacceptable for Xpert Xpress SARS-CoV-2/FLU/RSV testing.  Fact Sheet for Patients: BloggerCourse.com  Fact Sheet for Healthcare Providers: SeriousBroker.it  This test is not yet approved or cleared by the [place] FDA and has been authorized for detection and/or diagnosis of SARS-CoV-2 by FDA under an Emergency Use Authorization (EUA). This EUA will remain in effect (meaning this test can be used) for the duration of the COVID-19 declaration under Section 564(b)(1) of the Act, 21 U.S.C. section 360bbb-3(b)(1), unless the authorization is terminated  or revoked.     Resp Syncytial Virus by PCR NEGATIVE NEGATIVE Final    Comment: (NOTE) Fact Sheet for Patients: BloggerCourse.com  Fact Sheet for Healthcare Providers: SeriousBroker.it  This test is not yet approved or cleared by the [place] FDA and has been authorized for detection and/or diagnosis of SARS-CoV-2 by  FDA under an Emergency Use Authorization (EUA). This EUA will remain in effect (meaning this test can be used) for the duration of the COVID-19 declaration under Section 564(b)(1) of the Act, 21 U.S.C. section 360bbb-3(b)(1), unless the authorization is terminated or revoked.  Performed at [facility], [address], [place], [place] [address]   MRSA Next Gen by PCR, Nasal     Status: None   Collection Time: 04/27/23 12:08 AM   Specimen: Nasal Mucosa; Nasal Swab  Result Value Ref Range Status   MRSA by PCR Next Gen NOT DETECTED NOT DETECTED Final    Comment: (NOTE) The GeneXpert MRSA Assay (FDA approved for NASAL specimens only), is one component of a comprehensive MRSA colonization surveillance program. It is not intended to diagnose MRSA infection nor to guide or monitor treatment for MRSA infections. Test performance is not FDA approved in patients less than 43 years old. Performed at [facility], [address] [place], [place] [address]       Radiology Studies: DG Foot 2 Views Right  Result Date: 04/27/2023 CLINICAL DATA:  82 year old male history of osteomyelitis status post amputation. EXAM: RIGHT FOOT - 2 VIEW COMPARISON:  Right foot radiograph 03/30/2023. FINDINGS: Postoperative changes of transmetatarsal amputation are again noted. Adjacent to the resected portions of the proximal metatarsals there is a small amount of new bone formation. A small amount of lucency is noted in the lateral aspect of the resected fifth metatarsal. Gas noted in the soft tissues  of the surgical stump on the prior examination has resolved, however, the soft tissues appear slightly more swollen than the prior examination. Skin staples remain in position. No acute displaced fracture or dislocation noted. Advanced degenerative changes of osteoarthritis throughout the midfoot and hindfoot. IMPRESSION: 1. Postoperative changes of transmetatarsal amputation redemonstrated with increasing soft tissue swelling, but resolution of gas in the soft tissues noted on the prior study. Some bony healing is noted along the resection, with a small amount of new bone formation. There is also a subtle lucency in the lateral aspect of the resected fifth metatarsal diaphysis, which could represent normal bony absorption, although osteomyelitis could have a similar appearance. Electronically Signed   By: Trudie Reed M.D.   On: 04/27/2023 05:28   DG Abd 1 View  Result Date: 04/26/2023 CLINICAL DATA:  Constipation. EXAM: ABDOMEN - 1 VIEW COMPARISON:  July 25, 2011 FINDINGS: The bowel gas pattern is normal. A mild stool burden is noted. Radiopaque surgical clips are seen within the right upper quadrant. No radio-opaque calculi or other significant radiographic abnormality are seen. IMPRESSION: Mild stool burden without evidence of bowel obstruction. Electronically Signed   By: Aram Candela M.D.   On: 04/26/2023 20:55   CT Angio Chest PE W and/or Wo Contrast  Result Date: 04/26/2023 CLINICAL DATA:  Hypoxia with recent surgery. Low oxygen saturation. Shortness of breath on exertion. EXAM: CT ANGIOGRAPHY CHEST WITH CONTRAST TECHNIQUE: Multidetector CT imaging of the chest was performed using the standard protocol during bolus administration of intravenous contrast. Multiplanar CT image reconstructions and MIPs were obtained to evaluate the vascular anatomy. RADIATION DOSE REDUCTION: This exam was performed according to the departmental dose-optimization program which includes automated exposure  control, adjustment of the mA and/or kV according to patient size and/or use of iterative reconstruction technique. CONTRAST:  80mL OMNIPAQUE IOHEXOL 350 MG/ML SOLN COMPARISON:  Chest radiograph 04/26/2023 FINDINGS: Cardiovascular: Technically adequate study with moderately good opacification of the central and segmental pulmonary  arteries. No focal filling defects are identified. No evidence of significant pulmonary embolus. Cardiac enlargement. No pericardial effusion. Normal caliber thoracic aorta. No aortic dissection. Calcification of the aorta and coronary arteries. Mediastinum/Nodes: Thyroid gland is unremarkable. Esophagus is decompressed. Mediastinal lymphadenopathy with numerous lymph nodes throughout all quadrants of the mediastinum and extending up into the supraclavicular base of neck. Largest right paratracheal nodes measure up to about 1.7 cm short axis dimension. Etiology is nonspecific, possibly reactive or inflammatory. Lymphoproliferative process is not excluded. Clinical correlation is recommended. Lungs/Pleura: Moderate bilateral pleural effusions with bilateral basilar consolidation or atelectasis. No pneumothorax. Upper Abdomen: No acute abnormalities demonstrated in the visualized upper abdomen. Musculoskeletal: Degenerative changes in the spine. No acute bony abnormalities. Review of the MIP images confirms the above findings. IMPRESSION: 1. No evidence of significant pulmonary embolus. 2. Cardiac enlargement. 3. Aortic atherosclerosis. 4. Moderate bilateral pleural effusions with bilateral basilar consolidation, possibly pneumonia or compressive atelectasis. Electronically Signed   By: Burman Nieves M.D.   On: 04/26/2023 18:21   DG Chest 2 View  Result Date: 04/26/2023 CLINICAL DATA:  Cough and hypoxia. Low oxygen saturation. Shortness of breath on exertion. EXAM: CHEST - 2 VIEW COMPARISON:  10/14/2016 FINDINGS: Shallow inspiration. Mild cardiac enlargement. Bilateral perihilar and  basilar infiltrates with small bilateral pleural effusions, progressing since prior study. No pneumothorax. Mediastinal contours appear intact. IMPRESSION: Cardiac enlargement with small bilateral pleural effusions and perihilar/basilar infiltrates, likely edema although pneumonia could have this appearance as well. Electronically Signed   By: Burman Nieves M.D.   On: 04/26/2023 16:25       LOS: 1 day   Osvaldo Shipper  [facility] Pager on [email or web address]  04/27/2023, 10:44 AM

## 2023-04-27 NOTE — Evaluation (Signed)
Occupational Therapy Evaluation [name] MRN: [id number] DOB: 08/29/1941 Today's Date: 04/27/2023   History of Present Illness 82 yo male admitted with acute on chronic CHF, acute respiratory failure. 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 Other hx: CHF, Dm, mital stenosis, Aflutter bladder Ca, peripheral insufficiency, OSA, memory loss, osteomyelitis   Clinical Impression   Pt reports use of RW at baseline for mobility, is ind with ADLs, lives with spouse. Pt currently with poor awareness/adherence to NWB precautions for RLE, pt needing set up - mod A for ADLs, mod I for bed mobility and pt declines transfers this session. Per RN pt up to [facility] earlier with 1 person assist. Pt presenting with impairments listed below, will follow acutely. Patient will benefit from continued inpatient follow up therapy, <3 hours/day to maximize safety/independence with ADLs/functional mobility.       Recommendations for follow up therapy are one component of a multi-disciplinary discharge planning process, led by the attending physician.  Recommendations may be updated based on patient status, additional functional criteria and insurance authorization.   Assistance Recommended at Discharge Frequent or constant Supervision/Assistance  Patient can return home with the following A lot of help with walking and/or transfers;A lot of help with bathing/dressing/bathroom;Assistance with cooking/housework;Direct supervision/assist for medications management;Direct supervision/assist for financial management;Assist for transportation;Help with stairs or ramp for entrance    Functional Status Assessment  Patient has had a recent decline in their functional status and demonstrates the ability to make significant improvements in function in a reasonable and predictable amount of time.  Equipment Recommendations  Other (comment) (defer)     Recommendations for Other Services PT consult     Precautions / Restrictions Precautions Precautions: Fall Required Braces or Orthoses: Other Brace Other Brace: CAM boot in room Restrictions Weight Bearing Restrictions: Yes RLE Weight Bearing: Non weight bearing Other Position/Activity Restrictions: pt has been noncompliant with NWB status      Mobility Bed Mobility Overal bed mobility: Modified Independent             General bed mobility comments: increased time needed to come EOB    Transfers                   General transfer comment: Pt refused      Balance Overall balance assessment: Needs assistance Sitting-balance support: Feet supported Sitting balance-Leahy Scale: Fair Sitting balance - Comments: pt sits unsupported EOB, does not reach outside BOS without LOB                                   ADL either performed or assessed with clinical judgement   ADL Overall ADL's : Needs assistance/impaired Eating/Feeding: Set up;Sitting   Grooming: Set up;Sitting   Upper Body Bathing: Minimal assistance   Lower Body Bathing: Moderate assistance;Sitting/lateral leans   Upper Body Dressing : Minimal assistance;Sitting   Lower Body Dressing: Moderate assistance;Sitting/lateral leans Lower Body Dressing Details (indicate cue type and reason): donning cam boot Toilet Transfer: Moderate assistance   Toileting- Clothing Manipulation and Hygiene: Moderate assistance       Functional mobility during ADLs: Moderate assistance       Vision   Vision Assessment?: No apparent visual deficits     Perception Perception Perception Tested?: No   Praxis Praxis Praxis tested?: Not tested    Pertinent Vitals/Pain Pain Assessment Pain Assessment: No/denies pain  Hand Dominance     Extremity/Trunk Assessment Upper Extremity Assessment Upper Extremity Assessment: Generalized weakness   Lower Extremity Assessment Lower Extremity  Assessment: Defer to PT evaluation   Cervical / Trunk Assessment Cervical / Trunk Assessment: Normal   Communication Communication Communication: No difficulties   Cognition Arousal/Alertness: Awake/alert Behavior During Therapy: WFL for tasks assessed/performed Overall Cognitive Status: History of cognitive impairments - at baseline                                 General Comments: dementia at baseline     General Comments  VSS on RA    Exercises     Shoulder Instructions      Home Living Family/patient expects to be discharged to:: Private residence Living Arrangements: Spouse/significant other Available Help at Discharge: Family Type of Home: House Home Access: Stairs to enter Secretary/administrator of Steps: 1 Entrance Stairs-Rails: None Home Layout: One level     Bathroom Shower/Tub: Other (comment) (sitting tub)   Bathroom Toilet: Handicapped height Bathroom Accessibility: Yes   Home Equipment: Agricultural consultant (2 wheels);Cane - single point          Prior Functioning/Environment Prior Level of Function : Independent/Modified Independent             Mobility Comments: RW, sometimes with a cane ADLs Comments: ind        OT Problem List: Decreased strength;Decreased range of motion;Decreased activity tolerance;Impaired balance (sitting and/or standing);Decreased knowledge of precautions;Decreased cognition      OT Treatment/Interventions: Therapeutic exercise;Self-care/ADL training;Energy conservation;DME and/or AE instruction;Therapeutic activities;Patient/family education;Balance training    OT Goals(Current goals can be found in the care plan section) Acute Rehab OT Goals Patient Stated Goal: none stated OT Goal Formulation: With patient Time For Goal Achievement: 05/11/23 Potential to Achieve Goals: Good ADL Goals Pt Will Perform Upper Body Dressing: sitting;with min guard assist Pt Will Perform Lower Body Dressing: with min  guard assist;sit to/from stand;sitting/lateral leans Pt Will Transfer to Toilet: with min guard assist;ambulating;regular height toilet Additional ADL Goal #1: pt will perfrom 2 consecutive ADLs adhering to NWB precautions  OT Frequency: Min 1X/week    Co-evaluation              AM-PAC OT "6 Clicks" Daily Activity     Outcome Measure Help from another person eating meals?: None Help from another person taking care of personal grooming?: A Little Help from another person toileting, which includes using toliet, bedpan, or urinal?: A Lot Help from another person bathing (including washing, rinsing, drying)?: A Lot Help from another person to put on and taking off regular upper body clothing?: A Lot Help from another person to put on and taking off regular lower body clothing?: A Lot 6 Click Score: 15   End of Session Nurse Communication: Mobility status  Activity Tolerance: Patient tolerated treatment well Patient left: in bed;with call bell/phone within reach;with bed alarm set  OT Visit Diagnosis: Unsteadiness on feet (R26.81);Other abnormalities of gait and mobility (R26.89);Muscle weakness (generalized) (M62.81)                Time: 1914-7829 OT Time Calculation (min): 22 min Charges:  OT General Charges $OT Visit: 1 Visit OT Evaluation $OT Eval Low Complexity: 1 Low  Carver Fila, OTD, OTR/L SecureChat Preferred Acute Rehab [phone]   Carver Fila Koonce 04/27/2023, 4:35 PM

## 2023-04-27 NOTE — Inpatient Diabetes Management (Signed)
Inpatient Diabetes Program Recommendations  AACE/ADA: New Consensus Statement on Inpatient Glycemic Control (2015)  Target Ranges:  Prepandial:   less than 140 mg/dL      Peak postprandial:   less than 180 mg/dL (1-2 hours)      Critically ill patients:  140 - 180 mg/dL   Lab Results  Component Value Date   GLUCAP 215 (H) 04/27/2023   HGBA1C 5.8 (H) 04/26/2023    Review of Glycemic Control  Latest Reference Range & Units 04/27/23 03:46 04/27/23 07:27 04/27/23 11:06  Glucose-Capillary 70 - 99 mg/dL 409 (H) 811 (H) 914 (H)   Diabetes history: DM 2 Outpatient Diabetes medications: Toujeo 40 units Daily, novolog 8 units tid, Metformin 1000 mg bid Current orders for Inpatient glycemic control:  Semglee 40 units Daily Novolog 0-9 units q4 hours  A1c improved from 13.3% on 5/14 to 5.8% on 6/11 this admission Noted: insulin started this am.   Inpatient Diabetes Program Recommendations:    Follow trends on insulin. Regimen started this am.   Thanks,  Christena Deem RN, MSN, BC-ADM Inpatient Diabetes Coordinator Team Pager [phone] (8a-5p)

## 2023-04-27 NOTE — Consult Note (Addendum)
Cardiology Consultation   Patient ID: Jason [name] MRN: [id number]; DOB: May 29, 1941  Admit date: 04/26/2023 Date of Consult: 04/27/2023  PCP:  Jason Grandchild, MD   [facility] Providers Cardiologist:  Jason Rotunda, MD   {    Patient Profile:   Jason [name] is a 82 y.o. male with a hx of mitral valve stenosis, mitral valve prolapse and MR, atrial flutter, HLD, type 2DM who is being seen 04/27/2023 for the evaluation of CHF, mitral valve stenosis/regurgitation at the request of Jason [name].  History of Present Illness:   Jason [name] is an 82 year old male with above medical history who is followed by Dr. Antoine [name]. Per chart review, echocardiogram in 07/2020 showed EF 60-65%, grade I DD, normal RV systolic function, moderate-severe mitral valve stenosis and moderate holosystolic prolapse of the anterior leaflet of mitral valve. Patient later had a nuclear stress test in 08/2020 that was a normal, low risk study. At an appointment in 3/22, EKG showed atrial flutter. Cardiac monitor in 01/2021 showed continuous atrial flutter (100% burden). HR was well controlled, and patient was on therapeutic Xarelto. Of note, there have been some concerns with medication compliance over the years, and patient had intermittently stopped taking xarelto.   In 07/2021, patient underwent TEE for evaluation of mitral valve disease. TEE showed a rheumatic-appearing mitral valve. 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 Patient was difficult to reach to discuss study results. He eventually was seen in office by Dr. Antoine [name] in 10/2022, and they discussed whether or not patient would want valve surgery. Patient stated that he would want surgery if he was a candidate, and Dr. Antoine [name] explained to patient that to be a surgical candidate, he would need to get his blood sugar better controlled,  be compliant with medications and follow-up visit, and follow up with an oral surgeon to have his teeth removed.   He was last seen by cardiology on 12/28/22. At that time, patient was doing well and denied chest pain, shortness of breath, lower extremity edema, fatigue, palpitations. He was compliant with his medications and had been trying to exercise more frequently. He is followed by Vascular Surgery for PAD and previously had lower extremity transmetatarsal amputations.  Patient presented to the ED on 6/11 from his PCP due to low O2 saturation. Per EMS report, patient was at 86% on room air. Patient complained of SOB on exertion, but noted that this is normal for him. Labs in the ED significant for BNP 364, hsTn 31>31>29. Hemoglobin 9.4. COVID, flu, RSV negative. CTA chest showed no PE, cardiac enlargement, moderate bilateral pleural effusions with bilateral basilar consolidation.   On interview, patient is a poor historian. He reports that he came to the hospital because he had pain in his right foot. He recently had some of his right toes amputated, and he had pain near his surgical site. He has not been taking his Xarelto, but he cannot tell me why he stopped it. He said "sometimes I just do stuff like that". He denies recent chest pain. States that his breathing has been "reasonable". Occasional palpitations in his chest.   Past Medical History:  Diagnosis Date   Anxiety    Asthma    in past, no inhaler now   Benign neoplasm of colon    Bone infection (HCC)    Cancer (HCC)    bladder cancer  COPD (chronic obstructive pulmonary disease) (HCC)    Depression    Diverticulosis of colon (without mention of hemorrhage)    DJD (degenerative joint disease)    Esophageal reflux    Glaucoma    Hidradenitis    History of BPH    History of gynecomastia    Unilateral   Hyperlipidemia    Irritable bowel syndrome    Memory loss    Mitral stenosis    mild-moderate MS 01/2016   MVP (mitral  valve prolapse)    Obesity, unspecified    Pneumonia    Sleep apnea    does not use  cpap or bipap .. no study  ever   Type II or unspecified type diabetes mellitus without mention of complication, not stated as uncontrolled    Unspecified venous (peripheral) insufficiency     Past Surgical History:  Procedure Laterality Date   ACHILLES TENDON SURGERY Right 05/17/2019   Procedure: ACHILLES LENGTHENING/KIDNER;  Surgeon: Park Liter, DPM;  Location: [facility];  Service: Podiatry;  Laterality: Right;   AMPUTATION  08/09/2012   Procedure: AMPUTATION DIGIT;  Surgeon: Nadara Mustard, MD;  Location: [facility];  Service: Orthopedics;  Laterality: Right;  Amputation right Great Toe MTP Joint   AMPUTATION  10/17/2012   Procedure: AMPUTATION DIGIT;  Surgeon: Nadara Mustard, MD;  Location: [facility];  Service: Orthopedics;  Laterality: Right;  Right Foot 3rd Toe Amputation at Metatarsophalangeal Joint   AMPUTATION Right 03/30/2023   Procedure: TRANSMETATARSAL AMPUTATION, RIGHT FOOT;  Surgeon: Pilar Plate, DPM;  Location: [facility];  Service: Podiatry;  Laterality: Right;   AMPUTATION TOE Right 05/17/2019   Procedure: AMPUTATION TOE Metatarsal Phalangeal Joint   FOURTH AND FIFTH, FILLETED TOE FLAP;  Surgeon: Park Liter, DPM;  Location: [facility];  Service: Podiatry;  Laterality: Right;   BUBBLE STUDY  08/12/2021   Procedure: BUBBLE STUDY;  Surgeon: Sande Rives, MD;  Location: [facility];  Service: Cardiovascular;;   CHOLECYSTECTOMY N/A 06/14/2018   Procedure: LAPAROSCOPIC CHOLECYSTECTOMY WITH INTRAOPERATIVE CHOLANGIOGRAM;  Surgeon: Rodman Pickle, MD;  Location: [facility];  Service: General;  Laterality: N/A;   CIRCUMCISION  06/2100   For Phimosis - Dr Marcello Fennel   COLON SURGERY     CYSTOSCOPY W/ RETROGRADES Bilateral 07/07/2018   Procedure: CYSTOSCOPY WITH BILATERAL RETROGRADE PYELOGRAM;  Surgeon: Crist Fat, MD;  Location: [facility];  Service: Urology;  Laterality: Bilateral;   KNEE  ARTHROSCOPY     LAPAROSCOPIC LYSIS OF ADHESIONS  06/14/2018   Procedure: LAPAROSCOPIC LYSIS OF ADHESIONS;  Surgeon: Sheliah Hatch De Blanch, MD;  Location: [facility];  Service: General;;   LAPAROTOMY  07/20/11   LASER ABLATION  06/2009   Left Greater Saphenous vein -Dr Jenean Lindau OSTEOTOMY Right 05/17/2019   Procedure: METATARSLSECTOMY FOURTH DIGIT;  Surgeon: Park Liter, DPM;  Location: [facility];  Service: Podiatry;  Laterality: Right;   MULTIPLE TOOTH EXTRACTIONS     TEE WITHOUT CARDIOVERSION N/A 08/12/2021   Procedure: TRANSESOPHAGEAL ECHOCARDIOGRAM (TEE);  Surgeon: Sande Rives, MD;  Location: [facility] ENDOSCOPY;  Service: Cardiovascular;  Laterality: N/A;   TOE AMPUTATION  06/2009   x3Right foot second toe -Dr Lestine Box   TONSILLECTOMY     TRANSURETHRAL RESECTION OF BLADDER TUMOR N/A 07/07/2018   Procedure: TRANSURETHRAL RESECTION OF BLADDER TUMOR (TURBT) WITH POST OP INSTILLATION OF GEMCITABIN;  Surgeon: Crist Fat, MD;  Location: [facility];  Service: Urology;  Laterality: N/A;   WOUND DEBRIDEMENT Right 07/25/2019  Procedure: Right foot wound debridement with application of skin graft substitute;  Surgeon: Park Liter, DPM;  Location: [facility];  Service: Podiatry;  Laterality: Right;     Home Medications:  Prior to Admission medications   Medication Sig Start Date End Date Taking? Authorizing Provider  acetaminophen (TYLENOL) 325 MG tablet Take 2 tablets (650 mg total) by mouth every 6 (six) hours as needed for mild pain (or Fever >/= 101). 04/02/23  Yes Marinda Elk, MD  amoxicillin-clavulanate (AUGMENTIN) 875-125 MG tablet Take 1 tablet by mouth 2 (two) times daily for 14 days. 04/22/23 05/06/23 Yes Standiford, Jenelle Mages, DPM  Bempedoic Acid-Ezetimibe (NEXLIZET) 180-10 MG TABS Take 1 tablet by mouth daily. 04/21/23  Yes Jason Rotunda, MD  Continuous Glucose Sensor (FREESTYLE LIBRE 2 SENSOR) MISC 1 Act by Does not apply route daily. Patient taking differently: Inject 1  Device into the skin every 14 (fourteen) days. 04/13/23  Yes Jason Grandchild, MD  cyanocobalamin (VITAMIN B12) 500 MCG tablet Take 500 mcg by mouth daily.   Yes [provider]  doxycycline (VIBRA-TABS) 100 MG tablet Take 1 tablet (100 mg total) by mouth 2 (two) times daily for 14 days. 04/22/23 05/06/23 Yes Standiford, Jenelle Mages, DPM  insulin aspart (NOVOLOG) 100 UNIT/ML FlexPen Inject 8 Units into the skin 3 (three) times daily with meals. 04/02/23  Yes Marinda Elk, MD  insulin glargine, 2 Unit Dial, (TOUJEO MAX SOLOSTAR) 300 UNIT/ML Solostar Pen Inject 50 Units into the skin daily. 04/02/23  Yes Marinda Elk, MD  metFORMIN (GLUCOPHAGE) 500 MG tablet Take 2 tablets (1,000 mg total) by mouth 2 (two) times daily with a meal. 04/02/23  Yes Marinda Elk, MD  rosuvastatin (CRESTOR) 40 MG tablet Take 1 tablet (40 mg total) by mouth daily. 04/08/23 07/07/23 Yes Jason Rotunda, MD  thiamine (VITAMIN B-1) 100 MG tablet Take 100 mg by mouth daily.   Yes [provider]  Vitamin D, Ergocalciferol, (DRISDOL) 1.25 MG (50000 UNIT) CAPS capsule Take 50,000 Units by mouth every Wednesday.   Yes [provider]  B-D UF III MINI PEN NEEDLES 31G X 5 MM MISC  07/10/21   [provider]  Continuous Glucose Receiver (FREESTYLE LIBRE 2 READER) DEVI 1 Act by Does not apply route daily. 04/13/23   Jason Grandchild, MD  Glucagon (GVOKE HYPOPEN 2-PACK) 1 MG/0.2ML SOAJ Inject 1 Act into the skin daily as needed. Patient not taking: Reported on 04/26/2023 11/10/21   Jason Grandchild, MD  Insulin Pen Needle (B-D UF III MINI PEN NEEDLES) 31G X 5 MM MISC USE 1 PEN NEEDLE IN THE MORNING AND AT BEDTIME 04/05/23   Jason Grandchild, MD  rivaroxaban (XARELTO) 20 MG TABS tablet Take 1 tablet (20 mg total) by mouth daily. Patient not taking: Reported on 04/26/2023 10/18/22   Jason Rotunda, MD    Inpatient Medications: Scheduled Meds:  amoxicillin-clavulanate  1 tablet Oral Q12H    docusate sodium  100 mg Oral BID   doxycycline  100 mg Oral Q12H   furosemide  40 mg Intravenous Daily   guaiFENesin  600 mg Oral BID   insulin aspart  0-9 Units Subcutaneous Q4H   insulin glargine-yfgn  40 Units Subcutaneous Daily   ipratropium-albuterol  3 mL Nebulization TID   polyethylene glycol  17 g Oral Daily   rivaroxaban  20 mg Oral Q supper   rosuvastatin  40 mg Oral Daily   senna  1 tablet Oral BID  sodium chloride flush  3 mL Intravenous Q12H   thiamine  100 mg Oral Daily   Continuous Infusions:  sodium chloride     PRN Meds: sodium chloride, acetaminophen **OR** acetaminophen, albuterol, bisacodyl, HYDROcodone-acetaminophen, ondansetron **OR** ondansetron (ZOFRAN) IV, mouth rinse, polyethylene glycol, sodium chloride flush  Allergies:    Allergies  Allergen Reactions   Bactrim [Sulfamethoxazole-Trimethoprim] Rash    Social History:   Social History   Socioeconomic History   Marital status: Married    Spouse name: Jason [name]   Number of children: 2   Years of education: Not on file   Highest education level: Not on file  Occupational History   Occupation: retired  Tobacco Use   Smoking status: Former    Packs/day: 0.10    Years: 5.00    Additional pack years: 0.00    Total pack years: 0.50    Types: Cigarettes    Quit date: 05/10/2015    Years since quitting: 7.9   Smokeless tobacco: Never  Vaping Use   Vaping Use: Never used  Substance and Sexual Activity   Alcohol use: Not Currently    Alcohol/week: 7.0 standard drinks of alcohol    Types: 7 Cans of beer per week    Comment: social can of beer NONE RECENTLY   Drug use: No   Sexual activity: Not Currently  Other Topics Concern   Not on file  Social History Narrative   Not on file   Social Determinants of Health   Financial Resource Strain: Low Risk  (06/01/2022)   Overall Financial Resource Strain (CARDIA)    Difficulty of Paying Living Expenses: Not hard at all  Food Insecurity: No Food  Insecurity (04/26/2023)   Hunger Vital Sign    Worried About Running Out of Food in the Last Year: Never true    Ran Out of Food in the Last Year: Never true  Transportation Needs: No Transportation Needs (04/26/2023)   PRAPARE - Administrator, Civil Service (Medical): No    Lack of Transportation (Non-Medical): No  Physical Activity: Sufficiently Active (06/01/2022)   Exercise Vital Sign    Days of Exercise per Week: 5 days    Minutes of Exercise per Session: 30 min  Stress: Stress Concern Present (06/01/2022)   [employer] of Occupational Health - Occupational Stress Questionnaire    Feeling of Stress : Rather much  Social Connections: Socially Integrated (06/01/2022)   Social Connection and Isolation Panel [NHANES]    Frequency of Communication with Friends and Family: More than three times a week    Frequency of Social Gatherings with Friends and Family: More than three times a week    Attends Religious Services: More than 4 times per year    Active Member of [employer] or Organizations: Yes    Attends Engineer, structural: More than 4 times per year    Marital Status: Married  Catering manager Violence: Not At Risk (04/26/2023)   Humiliation, Afraid, Rape, and Kick questionnaire    Fear of Current or Ex-Partner: No    Emotionally Abused: No    Physically Abused: No    Sexually Abused: No    Family History:    Family History  Problem Relation Age of Onset   Diabetes Father    Cancer Father        Prostate   Stroke Father    Prostate cancer Father    High blood pressure Other    Colon cancer Neg Hx  Esophageal cancer Neg Hx    Rectal cancer Neg Hx    Stomach cancer Neg Hx      ROS:  Please see the history of present illness.  All other ROS reviewed and negative.     Physical Exam/Data:   Vitals:   04/27/23 0349 04/27/23 0612 04/27/23 0849 04/27/23 0854  BP: (!) 144/55  128/73   Pulse: 86  78   Resp: 18  18   Temp: 97.8 F (36.6 C)  97.8  F (36.6 C)   TempSrc: Oral  Oral   SpO2: 92%  98% 92%  Weight:  133.2 kg    Height:        Intake/Output Summary (Last 24 hours) at 04/27/2023 1108 Last data filed at 04/27/2023 1000 Gross per 24 hour  Intake 1072.48 ml  Output 2175 ml  Net -1102.52 ml      04/27/2023    6:12 AM 04/26/2023   11:08 PM 03/30/2023    2:03 PM  Last 3 Weights  Weight (lbs) 293 lb 10.4 oz 293 lb 10.4 oz 259 lb  Weight (kg) 133.2 kg 133.2 kg 117.482 kg     Body mass index is 32.26 kg/m.  General:  Elderly male, appears tired. No acute distress. Laying in the bed with head elevated.  HEENT: normal Neck: no JVD Vascular: Radial pulses 2+ bilaterally Cardiac:  normal S1, S2; RRR; Grade 2/6 systolic murmur at apex  Lungs: crackles in lung bases. Normal work of breathing on 3 L via [place]  Abd: soft, nontender, no hepatomegaly  Ext: no edema Musculoskeletal:  No deformities, BUE and BLE strength normal and equal Skin: warm and dry  Neuro:  CNs 2-12 intact, no focal abnormalities noted Psych:  Normal affect   EKG:  The EKG was personally reviewed and demonstrates:  Sinus rhythm with 1st degree AV block, HR 82 BPM, PVCs present  Telemetry:  Telemetry was personally reviewed and demonstrates:  Sinus with PVCs, HR in the 80s   Relevant CV Studies:  Laboratory Data:  High Sensitivity Troponin:   Recent Labs  Lab 04/26/23 1440 04/26/23 1822 04/26/23 1855 04/26/23 2330  TROPONINIHS 31* 31* 29* 24*     Chemistry Recent Labs  Lab 04/26/23 1440 04/26/23 2000 04/27/23 0833  NA 131*  --  135  K 4.5  --  4.7  CL 95*  --  95*  CO2 29  --  30  GLUCOSE 74  --  204*  BUN 14  --  15  CREATININE 0.83  --  0.92  CALCIUM 8.6*  --  8.7*  MG  --  1.8 2.2  GFRNONAA >60  --  >60  ANIONGAP 7  --  10    Recent Labs  Lab 04/26/23 1440 04/27/23 0833  PROT 7.4 8.0  ALBUMIN 3.4* 3.6  AST 24 21  ALT 22 24  ALKPHOS 42 46  BILITOT 0.8 1.1   Lipids No results for input(s): "CHOL", "TRIG", "HDL",  "LABVLDL", "LDLCALC", "CHOLHDL" in the last 168 hours.  Hematology Recent Labs  Lab 04/26/23 1440 04/26/23 2125 04/27/23 0833  WBC 6.9  --  7.7  RBC 2.98* 3.13* 3.23*  HGB 9.4*  --  10.3*  HCT 31.2*  --  33.7*  MCV 104.7*  --  104.3*  MCH 31.5  --  31.9  MCHC 30.1  --  30.6  RDW 14.6  --  14.6  PLT 264  --  280   Thyroid  Recent Labs  Lab  04/26/23 2125  TSH 0.771    BNP Recent Labs  Lab 04/26/23 1441  BNP 364.1*    DDimer  Recent Labs  Lab 04/26/23 1440  DDIMER 1.53*     Radiology/Studies:  DG Foot 2 Views Right  Result Date: 04/27/2023 CLINICAL DATA:  82 year old male history of osteomyelitis status post amputation. EXAM: RIGHT FOOT - 2 VIEW COMPARISON:  Right foot radiograph 03/30/2023. FINDINGS: Postoperative changes of transmetatarsal amputation are again noted. Adjacent to the resected portions of the proximal metatarsals there is a small amount of new bone formation. A small amount of lucency is noted in the lateral aspect of the resected fifth metatarsal. Gas noted in the soft tissues of the surgical stump on the prior examination has resolved, however, the soft tissues appear slightly more swollen than the prior examination. Skin staples remain in position. No acute displaced fracture or dislocation noted. Advanced degenerative changes of osteoarthritis throughout the midfoot and hindfoot. IMPRESSION: 1. Postoperative changes of transmetatarsal amputation redemonstrated with increasing soft tissue swelling, but resolution of gas in the soft tissues noted on the prior study. Some bony healing is noted along the resection, with a small amount of new bone formation. There is also a subtle lucency in the lateral aspect of the resected fifth metatarsal diaphysis, which could represent normal bony absorption, although osteomyelitis could have a similar appearance. Electronically Signed   By: Trudie Reed M.D.   On: 04/27/2023 05:28   DG Abd 1 View  Result Date:  04/26/2023 CLINICAL DATA:  Constipation. EXAM: ABDOMEN - 1 VIEW COMPARISON:  July 25, 2011 FINDINGS: The bowel gas pattern is normal. A mild stool burden is noted. Radiopaque surgical clips are seen within the right upper quadrant. No radio-opaque calculi or other significant radiographic abnormality are seen. IMPRESSION: Mild stool burden without evidence of bowel obstruction. Electronically Signed   By: Aram Candela M.D.   On: 04/26/2023 20:55   CT Angio Chest PE W and/or Wo Contrast  Result Date: 04/26/2023 CLINICAL DATA:  Hypoxia with recent surgery. Low oxygen saturation. Shortness of breath on exertion. EXAM: CT ANGIOGRAPHY CHEST WITH CONTRAST TECHNIQUE: Multidetector CT imaging of the chest was performed using the standard protocol during bolus administration of intravenous contrast. Multiplanar CT image reconstructions and MIPs were obtained to evaluate the vascular anatomy. RADIATION DOSE REDUCTION: This exam was performed according to the departmental dose-optimization program which includes automated exposure control, adjustment of the mA and/or kV according to patient size and/or use of iterative reconstruction technique. CONTRAST:  80mL OMNIPAQUE IOHEXOL 350 MG/ML SOLN COMPARISON:  Chest radiograph 04/26/2023 FINDINGS: Cardiovascular: Technically adequate study with moderately good opacification of the central and segmental pulmonary arteries. No focal filling defects are identified. No evidence of significant pulmonary embolus. Cardiac enlargement. No pericardial effusion. Normal caliber thoracic aorta. No aortic dissection. Calcification of the aorta and coronary arteries. Mediastinum/Nodes: Thyroid gland is unremarkable. Esophagus is decompressed. Mediastinal lymphadenopathy with numerous lymph nodes throughout all quadrants of the mediastinum and extending up into the supraclavicular base of neck. Largest right paratracheal nodes measure up to about 1.7 cm short axis dimension. Etiology  is nonspecific, possibly reactive or inflammatory. Lymphoproliferative process is not excluded. Clinical correlation is recommended. Lungs/Pleura: Moderate bilateral pleural effusions with bilateral basilar consolidation or atelectasis. No pneumothorax. Upper Abdomen: No acute abnormalities demonstrated in the visualized upper abdomen. Musculoskeletal: Degenerative changes in the spine. No acute bony abnormalities. Review of the MIP images confirms the above findings. IMPRESSION: 1. No evidence of significant pulmonary embolus.  2. Cardiac enlargement. 3. Aortic atherosclerosis. 4. Moderate bilateral pleural effusions with bilateral basilar consolidation, possibly pneumonia or compressive atelectasis. Electronically Signed   By: Burman Nieves M.D.   On: 04/26/2023 18:21   DG Chest 2 View  Result Date: 04/26/2023 CLINICAL DATA:  Cough and hypoxia. Low oxygen saturation. Shortness of breath on exertion. EXAM: CHEST - 2 VIEW COMPARISON:  10/14/2016 FINDINGS: Shallow inspiration. Mild cardiac enlargement. Bilateral perihilar and basilar infiltrates with small bilateral pleural effusions, progressing since prior study. No pneumothorax. Mediastinal contours appear intact. IMPRESSION: Cardiac enlargement with small bilateral pleural effusions and perihilar/basilar infiltrates, likely edema although pneumonia could have this appearance as well. Electronically Signed   By: Burman Nieves M.D.   On: 04/26/2023 16:25     Assessment and Plan:   Acute on Chronic Diastolic Heart Failure  Rheumatic Mitral Valve with severe MR and MS  - Most recent echocardiogram from 07/2020 showed EF EF 60-65%, Grade I DD, moderate-severe MS with moderate holosystolic prolapse of the anterior leaflet of the mitral valve. TEE from 07/2021 showed combined severe mitral stenosis and severe mitral regurgitation related to rheumatic mitral valve disease with severe leaflet calcificaiton  - Patient likely needs mitral valve surgery, but  he has a history of medication noncompliance, poorly controlled DM, and poor compliance with cardiology follow up. In the past, Dr. Antoine [name] told him that he would need to correct these behaviors prior to surgery. A1c today 5.8 and he has been doing better about coming to cardiology appointments. Is currently on Abx for possible wound infection on right foot which would need to be completely cleared prior to surgery  - Now, patient presented complaining of right foot pain near the site of recent toe amputation. He was on hypoxic on presentation, but denies shortness of breath or cough  - BNP elevated to 364. CTA chest showed moderate bilateral pleural effusions. He has crackles in lung bases on exam  - Agree with IV lasix 40 mg daily. Strict I/Os, daily weights, and daily BMPs  - Patient has already put out about 1 L urine so far - Echocardiogram pending  Atrial Flutter - Per telemetry, patient is in Sinus rhythm with first degree AV block. HR in the 80s  - He has not been compliant with xarelto- unclear why he has not been taking it. When asked why, he said "sometimes I do things like that"  - Resume xarleto 20 mg daily   HLD  - Lipid panel from 03/2023 showed LDL 114, HDL 50, triglycerides 118, total cholesterol 188  - Continue crestor 40 mg daily   Otherwise per primary  - Type 2 DM - A1c 5.8 - COPD - S/p recent amputation of right foot -- note, there is some concern for infection and he is on Abx  - Normocytic Anemia  - Hyponatremia    Risk Assessment/Risk Scores:   New York Heart Association (NYHA) Functional Class NYHA Class II  CHA2DS2-VASc Score = 6  This indicates a 9.7% annual risk of stroke. The patient's score is based upon: CHF History: 1 HTN History: 1 Diabetes History: 1 Stroke History: 0 Vascular Disease History: 1 Age Score: 2 Gender Score: 0   For questions or updates, please contact [facility] Please consult [email or web address] for contact info under     Signed, Jonita Albee, PA-C  04/27/2023 11:08 AM  History and all data above reviewed.  Patient examined.  I agree with the findings as above.   The patient  is well-known to me.  He presents now primarily with foot pain but is found to have pulmonary edema and heart failure in part related to his mitral valve disease as described in some diastolic dysfunction.  Unfortunately he is clearly been noncompliant with medical management.  Over time he has not been a surgical candidate because of difficulty reaching him, failure to comply with medication management.  He would be at high risk for valve surgery as we have discussed in detail.    I did speak with his wife.  But patient has some memory problems.  He says he has not been particularly short of breath.  He has not been describing PND or orthopnea.  However, his wife says there is been some dyspnea.  He has a little bit of lower extremity swelling.  The patient exam reveals COR: Regular rate and rhythm, 3 out of 6 holosystolic apical murmur radiating slightly to the axilla, no diastolic,  Lungs: Clear to auscultation no wheezing or crackles,  Abd: Positive bowel sounds without rebound or guarding, Ext: Mild bilateral edema.  All available labs, radiology testing, previous records reviewed. Agree with documented assessment and plan.  Mitral stenosis/mitral regurgitation: He has rheumatic heart disease but really has not been a surgical candidate with medical nonadherence.  He did however managed to get his teeth extracted and has come to the last 2 appointments so we might discuss this in future but is not immediate plan.  For now he just needs IV diuresis as above.   We will follow his I's and O's. I will increase Lasix to BID.  Atrial flutter: He is currently in sinus rhythm.  He has a high thromboembolic risk and does not take his anticoagulation unfortunately.   Will put him back on Xarelto.  Jason [name]  1:54 PM  04/27/2023

## 2023-04-27 NOTE — Evaluation (Signed)
Physical Therapy Evaluation [name] MRN: [id number] DOB: [birth year] Today's Date: 04/27/2023  History of Present Illness  82 yo male admitted with acute on chronic CHF, acute respiratory failure. 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 Other hx: CHF, Dm, mital stenosis, Aflutter bladder Ca, peripheral insufficiency, OSA, memory loss, osteomyelitis  Clinical Impression  On eval, pt was Mod Ind with bed mobility. He refused OOB activity on today despite encouragement and explanation of reason for visit-pt stated "I don't see the need for it." Pt returned to supine without assistance. O2 sat reading fluctuated quite a bit on RA during session: 87%-92% (difficulty getting consistent waveform)-made RN aware.  Will plan to follow and progress activity as pt will allow.     Recommendations for follow up therapy are one component of a multi-disciplinary discharge planning process, led by the attending physician.  Recommendations may be updated based on patient status, additional functional criteria and insurance authorization.  Follow Up Recommendations       Assistance Recommended at Discharge Frequent or constant Supervision/Assistance  Patient can return home with the following  Assist for transportation;Help with stairs or ramp for entrance;Assistance with cooking/housework;A little help with walking and/or transfers;A little help with bathing/dressing/bathroom    Equipment Recommendations None recommended by PT  Recommendations for Other Services       Functional Status Assessment Patient has had a recent decline in their functional status and demonstrates the ability to make significant improvements in function in a reasonable and predictable amount of time.     Precautions / Restrictions Precautions Required Braces or Orthoses: Other Brace Other Brace: CAM boot in room Restrictions Weight Bearing Restrictions:  Yes RLE Weight Bearing: Non weight bearing (with CAM boot) Other Position/Activity Restrictions: pt has been noncompliant with NWB status in recent past     Mobility  Bed Mobility Overal bed mobility: Modified Independent             General bed mobility comments: Supine<>Sit. Increased time.    Transfers                   General transfer comment: Pt refused    Ambulation/Gait                  Stairs            Wheelchair Mobility    Modified Rankin (Stroke Patients Only)       Balance                                             Pertinent Vitals/Pain Pain Assessment Pain Assessment: Faces Faces Pain Scale: No hurt    Home Living Family/patient expects to be discharged to:: Private residence Living Arrangements: Spouse/significant other Available Help at Discharge: Family Type of Home: House Home Access: Stairs to enter Entrance Stairs-Rails: None Entrance Stairs-Number of Steps: 1   Home Layout: One level Home Equipment: Agricultural consultant (2 wheels);Cane - single point      Prior Function Prior Level of Function : Independent/Modified Independent             Mobility Comments: RW, sometimes with a cane ADLs Comments: ind     Hand Dominance        Extremity/Trunk Assessment   Upper Extremity Assessment Upper Extremity Assessment: Defer to OT evaluation  Lower Extremity Assessment Lower Extremity Assessment:  (R foot with dressing)    Cervical / Trunk Assessment Cervical / Trunk Assessment: Normal  Communication   Communication: No difficulties  Cognition Arousal/Alertness: Awake/alert Behavior During Therapy: WFL for tasks assessed/performed Overall Cognitive Status: History of cognitive impairments - at baseline                                          General Comments      Exercises     Assessment/Plan    PT Assessment Patient needs continued PT services  PT  Problem List Decreased mobility;Decreased cognition;Decreased knowledge of use of DME;Decreased knowledge of precautions;Decreased balance;Decreased activity tolerance       PT Treatment Interventions DME instruction;Therapeutic activities;Gait training;Therapeutic exercise;Functional mobility training;Balance training    PT Goals (Current goals can be found in the Care Plan section)  Acute Rehab PT Goals Patient Stated Goal: none stated PT Goal Formulation: With patient Time For Goal Achievement: 05/11/23 Potential to Achieve Goals: Good    Frequency Min 1X/week     Co-evaluation               AM-PAC PT "6 Clicks" Mobility  Outcome Measure Help needed turning from your back to your side while in a flat bed without using bedrails?: None Help needed moving from lying on your back to sitting on the side of a flat bed without using bedrails?: None Help needed moving to and from a bed to a chair (including a wheelchair)?: A Little Help needed standing up from a chair using your arms (e.g., wheelchair or bedside chair)?: A Little Help needed to walk in hospital room?: A Little Help needed climbing 3-5 steps with a railing? : A Lot 6 Click Score: 19    End of Session   Activity Tolerance: Patient tolerated treatment well Patient left: in bed;with call bell/phone within reach;with bed alarm set   PT Visit Diagnosis: Difficulty in walking, not elsewhere classified (R26.2)    Time: 4132-4401 PT Time Calculation (min) (ACUTE ONLY): 17 min   Charges:   PT Evaluation $PT Eval Low Complexity: 1 Low             Faye Ramsay, PT Acute Rehabilitation  Office: [phone]

## 2023-04-27 NOTE — Progress Notes (Signed)
Initial Nutrition Assessment  DOCUMENTATION CODES:   Not applicable  INTERVENTION:  - Heart Healthy/Carb Modified diet per MD.  - Multivitamin with minerals daily - Monitor weight trends.   NUTRITION DIAGNOSIS:   Increased nutrient needs related to chronic illness as evidenced by estimated needs.  GOAL:   Patient will meet greater than or equal to 90% of their needs  MONITOR:   PO intake, Weight trends  REASON FOR ASSESSMENT:   Consult Assessment of nutrition requirement/status  ASSESSMENT:   82 y.o. male with PMH of COPD,  DM2 sp right forefoot amputation, severe mitral stenosis and regurgitation, HLD, bladder cancer who presented with worsening shortness of breath. Admitted for acute CHF.  Patient asleep at time of visit, did not wake to sound of voice. No family or visitors at bedside. He is noted to be a poor historian and was combative overnight.   Per chart review, weight has fluctuated between 259-293# over the past year. Lowest weight taken ~1 month ago and highest weight this admission. Patient noted to be experiencing fluid overload so suspect this is affecting current weight status.   RD saw this patient in May and he reported a UBW of 270# and denied recent changes in weight. Noted to have had insignificant weight loss. At that time, patient also reported he liked to eat and often had 3 meals a day at home. Had amputation that admission so wound healing education was provided to patient.   Patient documented to have had 100% of breakfast this morning. Will monitor intake for need to add nutrition supplements. Will add Multivitamin with minerals daily to support micronutrient needs.   Medications reviewed and include: Colace, Miralax, Senokot, Lasix, Insulin, Thiamine  Labs reviewed:  HA1C 5.8    NUTRITION - FOCUSED PHYSICAL EXAM:  Unable to perform at this time  Diet Order:   Diet Order             Diet heart healthy/carb modified Room service  appropriate? Yes; Fluid consistency: Thin  Diet effective now                   EDUCATION NEEDS:  Not appropriate for education at this time  Skin:  Skin Assessment: Skin Integrity Issues: Skin Integrity Issues:: Other (Comment) Other: Right foot  Last BM:  6/10  Height:  Ht Readings from Last 1 Encounters:  04/26/23 6\' 8"  (2.032 m)   Weight:  Wt Readings from Last 1 Encounters:  04/27/23 133.2 kg   BMI:  Body mass index is 32.26 kg/m.  Estimated Nutritional Needs:  Kcal:  2000-2250 kcals Protein:  90-115 grams Fluid:  >/= 2L    Shelle Iron RD, LDN For contact information, refer to [facility].

## 2023-04-28 DIAGNOSIS — J9601 Acute respiratory failure with hypoxia: Secondary | ICD-10-CM | POA: Diagnosis not present

## 2023-04-28 DIAGNOSIS — I5031 Acute diastolic (congestive) heart failure: Secondary | ICD-10-CM | POA: Diagnosis not present

## 2023-04-28 DIAGNOSIS — I4892 Unspecified atrial flutter: Secondary | ICD-10-CM | POA: Diagnosis not present

## 2023-04-28 DIAGNOSIS — Z89431 Acquired absence of right foot: Secondary | ICD-10-CM | POA: Diagnosis not present

## 2023-04-28 LAB — CBC
HCT: 31.5 % — ABNORMAL LOW (ref 39.0–52.0)
Hemoglobin: 9.5 g/dL — ABNORMAL LOW (ref 13.0–17.0)
MCH: 31.7 pg (ref 26.0–34.0)
MCHC: 30.2 g/dL (ref 30.0–36.0)
MCV: 105 fL — ABNORMAL HIGH (ref 80.0–100.0)
Platelets: 271 10*3/uL (ref 150–400)
RBC: 3 MIL/uL — ABNORMAL LOW (ref 4.22–5.81)
RDW: 14.6 % (ref 11.5–15.5)
WBC: 7.6 10*3/uL (ref 4.0–10.5)
nRBC: 0 % (ref 0.0–0.2)

## 2023-04-28 LAB — GLUCOSE, CAPILLARY
Glucose-Capillary: 125 mg/dL — ABNORMAL HIGH (ref 70–99)
Glucose-Capillary: 138 mg/dL — ABNORMAL HIGH (ref 70–99)
Glucose-Capillary: 166 mg/dL — ABNORMAL HIGH (ref 70–99)
Glucose-Capillary: 43 mg/dL — CL (ref 70–99)
Glucose-Capillary: 58 mg/dL — ABNORMAL LOW (ref 70–99)
Glucose-Capillary: 78 mg/dL (ref 70–99)
Glucose-Capillary: 83 mg/dL (ref 70–99)
Glucose-Capillary: 90 mg/dL (ref 70–99)

## 2023-04-28 LAB — BASIC METABOLIC PANEL
Anion gap: 10 (ref 5–15)
BUN: 24 mg/dL — ABNORMAL HIGH (ref 8–23)
CO2: 33 mmol/L — ABNORMAL HIGH (ref 22–32)
Calcium: 8.7 mg/dL — ABNORMAL LOW (ref 8.9–10.3)
Chloride: 94 mmol/L — ABNORMAL LOW (ref 98–111)
Creatinine, Ser: 1.19 mg/dL (ref 0.61–1.24)
GFR, Estimated: >60 mL/min (ref >60)
Glucose, Bld: 91 mg/dL (ref 70–99)
Potassium: 4.7 mmol/L (ref 3.5–5.1)
Sodium: 137 mmol/L (ref 135–145)

## 2023-04-28 MED ORDER — INSULIN ASPART 100 UNIT/ML IJ SOLN
0.0000 [IU] | Freq: Three times a day (TID) | INTRAMUSCULAR | Status: DC
  Administered 2023-04-28 (×2): 2 [IU] via SUBCUTANEOUS
  Administered 2023-04-29: 3 [IU] via SUBCUTANEOUS
  Administered 2023-04-29: 2 [IU] via SUBCUTANEOUS
  Administered 2023-04-30: 3 [IU] via SUBCUTANEOUS
  Administered 2023-04-30: 8 [IU] via SUBCUTANEOUS
  Administered 2023-04-30 – 2023-05-01 (×2): 5 [IU] via SUBCUTANEOUS
  Administered 2023-05-01: 8 [IU] via SUBCUTANEOUS
  Administered 2023-05-01: 5 [IU] via SUBCUTANEOUS
  Administered 2023-05-02: 11 [IU] via SUBCUTANEOUS
  Administered 2023-05-02 (×2): 3 [IU] via SUBCUTANEOUS
  Administered 2023-05-03: 5 [IU] via SUBCUTANEOUS
  Administered 2023-05-03: 8 [IU] via SUBCUTANEOUS
  Administered 2023-05-03: 3 [IU] via SUBCUTANEOUS
  Administered 2023-05-04 (×2): 15 [IU] via SUBCUTANEOUS
  Administered 2023-05-04: 5 [IU] via SUBCUTANEOUS
  Administered 2023-05-05: 3 [IU] via SUBCUTANEOUS
  Administered 2023-05-05 – 2023-05-06 (×2): 2 [IU] via SUBCUTANEOUS
  Administered 2023-05-06 – 2023-05-07 (×2): 15 [IU] via SUBCUTANEOUS
  Administered 2023-05-07: 3 [IU] via SUBCUTANEOUS
  Administered 2023-05-08: 11 [IU] via SUBCUTANEOUS
  Administered 2023-05-08: 5 [IU] via SUBCUTANEOUS
  Administered 2023-05-09 – 2023-05-10 (×2): 2 [IU] via SUBCUTANEOUS

## 2023-04-28 MED ORDER — INSULIN ASPART 100 UNIT/ML IJ SOLN
0.0000 [IU] | Freq: Every day | INTRAMUSCULAR | Status: DC
  Administered 2023-04-29: 2 [IU] via SUBCUTANEOUS
  Administered 2023-04-30: 4 [IU] via SUBCUTANEOUS
  Administered 2023-05-01: 5 [IU] via SUBCUTANEOUS
  Administered 2023-05-02: 2 [IU] via SUBCUTANEOUS
  Administered 2023-05-03 – 2023-05-04 (×2): 3 [IU] via SUBCUTANEOUS
  Administered 2023-05-07: 2 [IU] via SUBCUTANEOUS
  Administered 2023-05-08: 3 [IU] via SUBCUTANEOUS

## 2023-04-28 MED ORDER — IPRATROPIUM-ALBUTEROL 0.5-2.5 (3) MG/3ML IN SOLN
3.0000 mL | Freq: Two times a day (BID) | RESPIRATORY_TRACT | Status: DC
  Administered 2023-04-28 – 2023-05-03 (×10): 3 mL via RESPIRATORY_TRACT
  Filled 2023-04-28 (×11): qty 3

## 2023-04-28 MED ORDER — INSULIN GLARGINE-YFGN 100 UNIT/ML ~~LOC~~ SOLN
10.0000 [IU] | Freq: Every day | SUBCUTANEOUS | Status: DC
  Administered 2023-04-28 – 2023-04-30 (×2): 10 [IU] via SUBCUTANEOUS
  Filled 2023-04-28 (×4): qty 0.1

## 2023-04-28 NOTE — Progress Notes (Signed)
PROGRESS NOTE    Jason [name]  WUJ:[id number] DOB: Apr 09, 1941 DOA: 04/26/2023 PCP: [name], MD   Brief Narrative:  82 y.o. male with medical history significant of COPD,  DM2; status post right forefoot amputation recently and currently on oral antibiotics, severe mitral stenosis and regurgitation, history of atrial flutter on Xarelto, HLD, bladder cancer, sleep apnea presented with worsening shortness of breath.  He was noted to be hypoxic with saturations of 86% on room air.  He was subsequently admitted for acute diastolic CHF and started on IV Lasix.  Cardiology was consulted.  Assessment & Plan:   Acute respiratory failure with hypoxia Acute diastolic CHF Severe mitral stenosis; mild mitral regurgitation -Continue IV Lasix strict input and output.  Daily weights.  Fluid restriction.  Cardiology following.  Follow recommendations -Respiratory status improving: Currently on 1 L oxygen by nasal cannula.  Wean off as able.  Paroxysmal atrial flutter -Currently rate controlled.  Continue Xarelto.  Status post transmetatarsal amputation right foot Seen by podiatry recently.  There was some concern for infection.  Surgery was a few weeks ago.  Patient was started on Augmentin and doxycycline by podiatry.  Continue same.  Follow-up with podiatry in the outpatient setting.  ESR was noted to be 50.  CRP less than 0.5.   Diabetes mellitus type 2 with hypoglycemia -Monitor CBGs.  Continue with SSI.  Decrease long-acting insulin.  Carb modified diet.  Hyperlipidemia -Continue with statin.   History of COPD -Respiratory status is stable. -No evidence for COPD exacerbation.   Normocytic anemia -Stable.   Hyponatremia -Improved.   Obesity -Outpatient follow-up  Goals of care -Overall prognosis is guarded to poor.  Palliative care consultation goals of care discussion.  Currently listed as full code  Physical deconditioning -PT recommended SNF placement.  TOC  following.    DVT prophylaxis: Xarelto Code Status: Full Family Communication: None at bedside Disposition Plan: Status is: Inpatient Remains inpatient appropriate because: Of severity of illness  Consultants: Cardiology.  Consult palliative care  Procedures: None  Antimicrobials: Augmentin and doxycycline   Subjective: Patient seen and examined at bedside.  No fever, vomiting, seizures or agitation reported.  Poor historian.  Objective: Vitals:   04/28/23 0913 04/28/23 1000 04/28/23 1100 04/28/23 1231  BP:    136/68  Pulse:    79  Resp:    18  Temp:    98 F (36.7 C)  TempSrc:    Oral  SpO2: 91% (!) 85% 94% 96%  Weight:      Height:        Intake/Output Summary (Last 24 hours) at 04/28/2023 1420 Last data filed at 04/28/2023 1200 Gross per 24 hour  Intake 1080 ml  Output 4175 ml  Net -3095 ml   Filed Weights   04/26/23 2308 04/27/23 0612 04/28/23 0500  Weight: 133.2 kg 133.2 kg 134.4 kg    Examination:  General exam: Appears calm and comfortable.  On 1 L oxygen via nasal.  Chronically ill and deconditioned looking.   Respiratory system: Bilateral decreased breath sounds at bases with scattered crackles Cardiovascular system: S1 & S2 heard, Rate controlled Gastrointestinal system: Abdomen is obese, nondistended, soft and nontender. Normal bowel sounds heard. Extremities: No cyanosis, clubbing; lower extremity edema present; right foot dressing present Central nervous system: Alert and awake.  Slow to respond.  Poor historian.  No focal neurological deficits. Moving extremities Skin: No rashes, lesions or ulcers Psychiatry: Flat affect.  Not agitated.    Data Reviewed:  I have personally reviewed following labs and imaging studies  CBC: Recent Labs  Lab 04/26/23 1440 04/27/23 0833 04/28/23 0452  WBC 6.9 7.7 7.6  HGB 9.4* 10.3* 9.5*  HCT 31.2* 33.7* 31.5*  MCV 104.7* 104.3* 105.0*  PLT 264 280 271   Basic Metabolic Panel: Recent Labs  Lab  04/26/23 1440 04/26/23 2000 04/27/23 0833 04/28/23 0452  NA 131*  --  135 137  K 4.5  --  4.7 4.7  CL 95*  --  95* 94*  CO2 29  --  30 33*  GLUCOSE 74  --  204* 91  BUN 14  --  15 24*  CREATININE 0.83  --  0.92 1.19  CALCIUM 8.6*  --  8.7* 8.7*  MG  --  1.8 2.2  --   PHOS  --  4.4 5.3*  --    GFR: Estimated Creatinine Clearance: 76.7 mL/min (by C-G formula based on SCr of 1.19 mg/dL). Liver Function Tests: Recent Labs  Lab 04/26/23 1440 04/27/23 0833  AST 24 21  ALT 22 24  ALKPHOS 42 46  BILITOT 0.8 1.1  PROT 7.4 8.0  ALBUMIN 3.4* 3.6   No results for input(s): "LIPASE", "AMYLASE" in the last 168 hours. No results for input(s): "AMMONIA" in the last 168 hours. Coagulation Profile: Recent Labs  Lab 04/26/23 1855  INR 2.3*   Cardiac Enzymes: Recent Labs  Lab 04/26/23 2000  CKTOTAL 87   BNP (last 3 results) No results for input(s): "PROBNP" in the last 8760 hours. HbA1C: Recent Labs    04/26/23 2125  HGBA1C 5.8*   CBG: Recent Labs  Lab 04/28/23 0440 04/28/23 0755 04/28/23 0816 04/28/23 0842 04/28/23 1152  GLUCAP 83 43* 58* 90 125*   Lipid Profile: No results for input(s): "CHOL", "HDL", "LDLCALC", "TRIG", "CHOLHDL", "LDLDIRECT" in the last 72 hours. Thyroid Function Tests: Recent Labs    04/26/23 2125  TSH 0.771   Anemia Panel: Recent Labs    04/26/23 2125  VITAMINB12 250  FOLATE 18.5  FERRITIN 96  TIBC 316  IRON 44*  RETICCTPCT 6.8*   Sepsis Labs: Recent Labs  Lab 04/26/23 2000  PROCALCITON <0.10    Recent Results (from the past 240 hour(s))  Resp panel by RT-PCR (RSV, Flu A&B, Covid) Anterior Nasal Swab     Status: None   Collection Time: 04/26/23  2:40 PM   Specimen: Anterior Nasal Swab  Result Value Ref Range Status   SARS Coronavirus 2 by RT PCR NEGATIVE NEGATIVE Final    Comment: (NOTE) SARS-CoV-2 target nucleic acids are NOT DETECTED.  The SARS-CoV-2 RNA is generally detectable in upper respiratory specimens during  the acute phase of infection. The lowest concentration of SARS-CoV-2 viral copies this assay can detect is 138 copies/mL. A negative result does not preclude SARS-Cov-2 infection and should not be used as the sole basis for treatment or other patient management decisions. A negative result may occur with  improper specimen collection/handling, submission of specimen other than nasopharyngeal swab, presence of viral mutation(s) within the areas targeted by this assay, and inadequate number of viral copies(<138 copies/mL). A negative result must be combined with clinical observations, patient history, and epidemiological information. The expected result is Negative.  Fact Sheet for Patients:  BloggerCourse.com  Fact Sheet for Healthcare Providers:  SeriousBroker.it  This test is no t yet approved or cleared by the [place] FDA and  has been authorized for detection and/or diagnosis of SARS-CoV-2 by FDA under an Emergency Use  Authorization (EUA). This EUA will remain  in effect (meaning this test can be used) for the duration of the COVID-19 declaration under Section 564(b)(1) of the Act, 21 U.S.C.section 360bbb-3(b)(1), unless the authorization is terminated  or revoked sooner.       Influenza A by PCR NEGATIVE NEGATIVE Final   Influenza B by PCR NEGATIVE NEGATIVE Final    Comment: (NOTE) The Xpert Xpress SARS-CoV-2/FLU/RSV plus assay is intended as an aid in the diagnosis of influenza from Nasopharyngeal swab specimens and should not be used as a sole basis for treatment. Nasal washings and aspirates are unacceptable for Xpert Xpress SARS-CoV-2/FLU/RSV testing.  Fact Sheet for Patients: BloggerCourse.com  Fact Sheet for Healthcare Providers: SeriousBroker.it  This test is not yet approved or cleared by the [place] FDA and has been authorized for detection and/or  diagnosis of SARS-CoV-2 by FDA under an Emergency Use Authorization (EUA). This EUA will remain in effect (meaning this test can be used) for the duration of the COVID-19 declaration under Section 564(b)(1) of the Act, 21 U.S.C. section 360bbb-3(b)(1), unless the authorization is terminated or revoked.     Resp Syncytial Virus by PCR NEGATIVE NEGATIVE Final    Comment: (NOTE) Fact Sheet for Patients: BloggerCourse.com  Fact Sheet for Healthcare Providers: SeriousBroker.it  This test is not yet approved or cleared by the [place] FDA and has been authorized for detection and/or diagnosis of SARS-CoV-2 by FDA under an Emergency Use Authorization (EUA). This EUA will remain in effect (meaning this test can be used) for the duration of the COVID-19 declaration under Section 564(b)(1) of the Act, 21 U.S.C. section 360bbb-3(b)(1), unless the authorization is terminated or revoked.  Performed at [facility], [address] [place], [place] [address]   MRSA Next Gen by PCR, Nasal     Status: None   Collection Time: 04/27/23 12:08 AM   Specimen: Nasal Mucosa; Nasal Swab  Result Value Ref Range Status   MRSA by PCR Next Gen NOT DETECTED NOT DETECTED Final    Comment: (NOTE) The GeneXpert MRSA Assay (FDA approved for NASAL specimens only), is one component of a comprehensive MRSA colonization surveillance program. It is not intended to diagnose MRSA infection nor to guide or monitor treatment for MRSA infections. Test performance is not FDA approved in patients less than 108 years old. Performed at [facility], [address] [place], [place] [address]          Radiology Studies: ECHOCARDIOGRAM COMPLETE  Result Date: 04/27/2023    ECHOCARDIOGRAM REPORT   Patient Name:   Jason [name] Date of Exam: 04/27/2023 Medical Rec #:  [id number]       Height:       80.0 in Accession #:     [id number]      Weight:       293.7 lb Date of Birth:  [birth year]        BSA:          2.707 m Patient Age:    82 years        BP:           146/73 mmHg Patient Gender: M               HR:           77 bpm. Exam Location:  Inpatient Procedure: 2D Echo, Cardiac Doppler and Color Doppler Indications:    Valvular Abnormality  History:  Patient has prior history of Echocardiogram examinations, most                 recent 08/12/2021. CHF, Mitral Valve Disease and Mitral Valve                 Prolapse, Arrythmias:Atrial Flutter, Signs/Symptoms:Murmur; Risk                 Factors:Diabetes.  Sonographer:    Darlys Gales Referring Phys: [id number] ANASTASSIA DOUTOVA IMPRESSIONS  1. Left ventricular ejection fraction, by estimation, is 60 to 65%. The left ventricle has normal function. The left ventricle has no regional wall motion abnormalities. There is severe asymmetric left ventricular hypertrophy of the septal segment. Left  ventricular diastolic parameters are indeterminate. There is the interventricular septum is flattened in diastole ('D' shaped left ventricle), consistent with right ventricular volume overload.  2. Right ventricular systolic function is normal. The right ventricular size is not well visualized. There is mildly elevated pulmonary artery systolic pressure. The estimated right ventricular systolic pressure is 43.3 mmHg.  3. Left atrial size was moderately dilated.  4. MVA by continuity equation < 1.5 cm2; unable to direct planimeter valve. There is minimal forward movement, extensive calcification and thickneing with relative sparing of the subvalvular apparatus. Wilkins score 14; not amenable to balloon intervention . The mitral valve is rheumatic. Mild mitral valve regurgitation. Severe mitral stenosis. The mean mitral valve gradient is 22.0 mmHg with average heart rate of 77 bpm.  5. The aortic valve was not well visualized. Aortic valve regurgitation is not visualized. No aortic stenosis is present.   6. The inferior vena cava is dilated in size with <50% respiratory variability, suggesting right atrial pressure of 15 mmHg. Comparison(s): Prior images reviewed side by side. Progression of mitral stenosis, mitral regurgitation is less than on prior TEE assessment. FINDINGS  Left Ventricle: Left ventricular ejection fraction, by estimation, is 60 to 65%. The left ventricle has normal function. The left ventricle has no regional wall motion abnormalities. The left ventricular internal cavity size was normal in size. There is  severe asymmetric left ventricular hypertrophy of the septal segment. The interventricular septum is flattened in diastole ('D' shaped left ventricle), consistent with right ventricular volume overload. Left ventricular diastolic parameters are indeterminate. Right Ventricle: The right ventricular size is not well visualized. No increase in right ventricular wall thickness. Right ventricular systolic function is normal. There is mildly elevated pulmonary artery systolic pressure. The tricuspid regurgitant velocity is 2.66 m/s, and with an assumed right atrial pressure of 15 mmHg, the estimated right ventricular systolic pressure is 43.3 mmHg. Left Atrium: Left atrial size was moderately dilated. Right Atrium: Right atrial size was normal in size. Pericardium: There is no evidence of pericardial effusion. Mitral Valve: MVA by continuity equation < 1.5 cm2; unable to direct planimeter valve. There is minimal forward movement, extensive calcification and thickneing with relative sparing of the subvalvular apparatus. Wilkins score 14; not amenable to balloon  intervention. The mitral valve is rheumatic. Mild mitral valve regurgitation. Severe mitral valve stenosis. MV peak gradient, 30.8 mmHg. The mean mitral valve gradient is 22.0 mmHg with average heart rate of 77 bpm. Tricuspid Valve: The tricuspid valve is grossly normal. Tricuspid valve regurgitation is not demonstrated. No evidence of  tricuspid stenosis. Aortic Valve: The aortic valve was not well visualized. There is mild aortic valve annular calcification. Aortic valve regurgitation is not visualized. No aortic stenosis is present. Aortic valve mean gradient measures 7.0 mmHg. Aortic  valve peak gradient measures 13.1 mmHg. Aortic valve area, by VTI measures 1.80 cm. Pulmonic Valve: The pulmonic valve was not well visualized. Pulmonic valve regurgitation is not visualized. No evidence of pulmonic stenosis. Aorta: The aortic root is normal in size and structure. Venous: The inferior vena cava is dilated in size with less than 50% respiratory variability, suggesting right atrial pressure of 15 mmHg. IAS/Shunts: No atrial level shunt detected by color flow Doppler.  LEFT VENTRICLE PLAX 2D LVIDd:         4.20 cm   Diastology LVIDs:         2.70 cm   LV e' medial:  3.37 cm/s LV PW:         1.30 cm   LV e' lateral: 4.90 cm/s LV IVS:        1.30 cm LVOT diam:     2.00 cm LV SV:         53 LV SV Index:   19 LVOT Area:     3.14 cm  RIGHT VENTRICLE RV S prime:     13.50 cm/s TAPSE (M-mode): 2.0 cm LEFT ATRIUM              Index        RIGHT ATRIUM           Index LA Vol (A2C):   131.0 ml 48.39 ml/m  RA Area:     21.80 cm LA Vol (A4C):   80.9 ml  29.88 ml/m  RA Volume:   67.20 ml  24.82 ml/m LA Biplane Vol: 111.0 ml 41.00 ml/m  AORTIC VALVE AV Area (Vmax):    1.53 cm AV Area (Vmean):   1.50 cm AV Area (VTI):     1.80 cm AV Vmax:           181.00 cm/s AV Vmean:          125.000 cm/s AV VTI:            0.294 m AV Peak Grad:      13.1 mmHg AV Mean Grad:      7.0 mmHg LVOT Vmax:         87.90 cm/s LVOT Vmean:        59.700 cm/s LVOT VTI:          0.168 m LVOT/AV VTI ratio: 0.57 MITRAL VALVE              TRICUSPID VALVE MV Area VTI:  0.62 cm    TR Peak grad:   28.3 mmHg MV Peak grad: 30.8 mmHg   TR Vmax:        266.00 cm/s MV Mean grad: 22.0 mmHg MV Vmax:      2.78 m/s    SHUNTS MV Vmean:     204.3 cm/s  Systemic VTI:  0.17 m                            Systemic Diam: 2.00 cm Riley Lam MD Electronically signed by Riley Lam MD Signature Date/Time: 04/27/2023/4:49:25 PM    Final    DG Foot 2 Views Right  Result Date: 04/27/2023 CLINICAL DATA:  82 year old male history of osteomyelitis status post amputation. EXAM: RIGHT FOOT - 2 VIEW COMPARISON:  Right foot radiograph 03/30/2023. FINDINGS: Postoperative changes of transmetatarsal amputation are again noted. Adjacent to the resected portions of the proximal metatarsals there is a small amount of new bone formation. A small amount of  lucency is noted in the lateral aspect of the resected fifth metatarsal. Gas noted in the soft tissues of the surgical stump on the prior examination has resolved, however, the soft tissues appear slightly more swollen than the prior examination. Skin staples remain in position. No acute displaced fracture or dislocation noted. Advanced degenerative changes of osteoarthritis throughout the midfoot and hindfoot. IMPRESSION: 1. Postoperative changes of transmetatarsal amputation redemonstrated with increasing soft tissue swelling, but resolution of gas in the soft tissues noted on the prior study. Some bony healing is noted along the resection, with a small amount of new bone formation. There is also a subtle lucency in the lateral aspect of the resected fifth metatarsal diaphysis, which could represent normal bony absorption, although osteomyelitis could have a similar appearance. Electronically Signed   By: Trudie Reed M.D.   On: 04/27/2023 05:28   DG Abd 1 View  Result Date: 04/26/2023 CLINICAL DATA:  Constipation. EXAM: ABDOMEN - 1 VIEW COMPARISON:  July 25, 2011 FINDINGS: The bowel gas pattern is normal. A mild stool burden is noted. Radiopaque surgical clips are seen within the right upper quadrant. No radio-opaque calculi or other significant radiographic abnormality are seen. IMPRESSION: Mild stool burden without evidence of bowel obstruction.  Electronically Signed   By: Aram Candela M.D.   On: 04/26/2023 20:55   CT Angio Chest PE W and/or Wo Contrast  Result Date: 04/26/2023 CLINICAL DATA:  Hypoxia with recent surgery. Low oxygen saturation. Shortness of breath on exertion. EXAM: CT ANGIOGRAPHY CHEST WITH CONTRAST TECHNIQUE: Multidetector CT imaging of the chest was performed using the standard protocol during bolus administration of intravenous contrast. Multiplanar CT image reconstructions and MIPs were obtained to evaluate the vascular anatomy. RADIATION DOSE REDUCTION: This exam was performed according to the departmental dose-optimization program which includes automated exposure control, adjustment of the mA and/or kV according to patient size and/or use of iterative reconstruction technique. CONTRAST:  80mL OMNIPAQUE IOHEXOL 350 MG/ML SOLN COMPARISON:  Chest radiograph 04/26/2023 FINDINGS: Cardiovascular: Technically adequate study with moderately good opacification of the central and segmental pulmonary arteries. No focal filling defects are identified. No evidence of significant pulmonary embolus. Cardiac enlargement. No pericardial effusion. Normal caliber thoracic aorta. No aortic dissection. Calcification of the aorta and coronary arteries. Mediastinum/Nodes: Thyroid gland is unremarkable. Esophagus is decompressed. Mediastinal lymphadenopathy with numerous lymph nodes throughout all quadrants of the mediastinum and extending up into the supraclavicular base of neck. Largest right paratracheal nodes measure up to about 1.7 cm short axis dimension. Etiology is nonspecific, possibly reactive or inflammatory. Lymphoproliferative process is not excluded. Clinical correlation is recommended. Lungs/Pleura: Moderate bilateral pleural effusions with bilateral basilar consolidation or atelectasis. No pneumothorax. Upper Abdomen: No acute abnormalities demonstrated in the visualized upper abdomen. Musculoskeletal: Degenerative changes in the  spine. No acute bony abnormalities. Review of the MIP images confirms the above findings. IMPRESSION: 1. No evidence of significant pulmonary embolus. 2. Cardiac enlargement. 3. Aortic atherosclerosis. 4. Moderate bilateral pleural effusions with bilateral basilar consolidation, possibly pneumonia or compressive atelectasis. Electronically Signed   By: Burman Nieves M.D.   On: 04/26/2023 18:21   DG Chest 2 View  Result Date: 04/26/2023 CLINICAL DATA:  Cough and hypoxia. Low oxygen saturation. Shortness of breath on exertion. EXAM: CHEST - 2 VIEW COMPARISON:  10/14/2016 FINDINGS: Shallow inspiration. Mild cardiac enlargement. Bilateral perihilar and basilar infiltrates with small bilateral pleural effusions, progressing since prior study. No pneumothorax. Mediastinal contours appear intact. IMPRESSION: Cardiac enlargement with small bilateral pleural effusions and  perihilar/basilar infiltrates, likely edema although pneumonia could have this appearance as well. Electronically Signed   By: Burman Nieves M.D.   On: 04/26/2023 16:25        Scheduled Meds:  amoxicillin-clavulanate  1 tablet Oral Q12H   docusate sodium  100 mg Oral BID   doxycycline  100 mg Oral Q12H   furosemide  40 mg Intravenous BID   guaiFENesin  600 mg Oral BID   insulin aspart  0-15 Units Subcutaneous TID WC   insulin aspart  0-5 Units Subcutaneous QHS   insulin glargine-yfgn  10 Units Subcutaneous Daily   ipratropium-albuterol  3 mL Nebulization BID   multivitamin with minerals  1 tablet Oral Daily   polyethylene glycol  17 g Oral Daily   rivaroxaban  20 mg Oral Q supper   rosuvastatin  40 mg Oral Daily   senna  1 tablet Oral BID   sodium chloride flush  3 mL Intravenous Q12H   thiamine  100 mg Oral Daily   Continuous Infusions:  sodium chloride            Glade Lloyd, MD [facility] 04/28/2023, 2:20 PM

## 2023-04-28 NOTE — Progress Notes (Signed)
Hypoglycemic Event  CBG: 43  Treatment: 4 oz juice/soda  Symptoms: None  Follow-up CBG: Time:0817 CBG Result:58  Additional juice given to patient, breakfast order placed.   CBG checked at 0842 and result: 90  Possible Reasons for Event: Medication regimen: Long acting insulin order and first dose given to patient on 6/12.   Possible reason for hypoglycemia.  Comments/MD notified:MD Gladstone Lighter, Isaiah Serge A

## 2023-04-28 NOTE — TOC Progression Note (Signed)
Transition of Care [facility]) - Progression Note    Patient Details  Name: Jason [name] MRN: [id number] Date of Birth: Feb 26, 1941  [facility] CM/SW Contact  Larrie Kass, LCSW Phone Number: 04/28/2023, 4:03 PM  Clinical Narrative:    CSW spoke with pt's wife to presented bed offers. Pt's wife is requesting time to review. TOC to follow.    Expected Discharge Plan: Skilled Nursing Facility Barriers to Discharge: Continued Medical Work up  Expected Discharge Plan and Services       Living arrangements for the past 2 months: Single Family Home                                       Social Determinants of Health (SDOH) Interventions SDOH Screenings   Food Insecurity: No Food Insecurity (04/26/2023)  Housing: Medium Risk (04/26/2023)  Transportation Needs: No Transportation Needs (04/26/2023)  Utilities: Not At Risk (04/26/2023)  Alcohol Screen: Low Risk  (06/01/2022)  Depression (PHQ2-9): Low Risk  (06/01/2022)  Financial Resource Strain: Low Risk  (06/01/2022)  Physical Activity: Sufficiently Active (06/01/2022)  Social Connections: Socially Integrated (06/01/2022)  Stress: Stress Concern Present (06/01/2022)  Tobacco Use: Medium Risk (04/26/2023)    Readmission Risk Interventions     No data to display

## 2023-04-28 NOTE — Progress Notes (Signed)
Progress Note  Patient Name: Jason [name] Date of Encounter: 04/28/2023  Primary Cardiologist:   [name], MD   Subjective   He denies chest pain or SOB.   Inpatient Medications    Scheduled Meds:  amoxicillin-clavulanate  1 tablet Oral Q12H   docusate sodium  100 mg Oral BID   doxycycline  100 mg Oral Q12H   furosemide  40 mg Intravenous BID   guaiFENesin  600 mg Oral BID   insulin aspart  0-15 Units Subcutaneous TID WC   insulin aspart  0-5 Units Subcutaneous QHS   insulin glargine-yfgn  10 Units Subcutaneous Daily   ipratropium-albuterol  3 mL Nebulization BID   multivitamin with minerals  1 tablet Oral Daily   polyethylene glycol  17 g Oral Daily   rivaroxaban  20 mg Oral Q supper   rosuvastatin  40 mg Oral Daily   senna  1 tablet Oral BID   sodium chloride flush  3 mL Intravenous Q12H   thiamine  100 mg Oral Daily   Continuous Infusions:  sodium chloride     PRN Meds: sodium chloride, acetaminophen **OR** acetaminophen, albuterol, bisacodyl, HYDROcodone-acetaminophen, ondansetron **OR** ondansetron (ZOFRAN) IV, mouth rinse, polyethylene glycol, sodium chloride flush   Vital Signs    Vitals:   04/28/23 0532 04/28/23 0913 04/28/23 1000 04/28/23 1100  BP: 133/63     Pulse: 75     Resp: 16     Temp: 98.2 F (36.8 C)     TempSrc: Oral     SpO2: 98% 91% (!) 85% 94%  Weight:      Height:        Intake/Output Summary (Last 24 hours) at 04/28/2023 1227 Last data filed at 04/28/2023 0955 Gross per 24 hour  Intake 1060 ml  Output 2475 ml  Net -1415 ml   Filed Weights   04/26/23 2308 04/27/23 0612 04/28/23 0500  Weight: 133.2 kg 133.2 kg 134.4 kg    Telemetry    Atrial flutter with variable conduction.  - Personally Reviewed  ECG    NA - Personally Reviewed  Physical Exam   GEN: No acute distress.   Neck: No  JVD Cardiac: Irregular RR, systolic murmur at the apex with slight diastolic  murmurs, rubs, or gallops.  Respiratory: Clear   to auscultation bilaterally. GI: Soft, nontender, non-distended  MS: No  edema; No deformity. Neuro:  Nonfocal  Psych: Normal affect   Labs    Chemistry Recent Labs  Lab 04/26/23 1440 04/27/23 0833 04/28/23 0452  NA 131* 135 137  K 4.5 4.7 4.7  CL 95* 95* 94*  CO2 29 30 33*  GLUCOSE 74 204* 91  BUN 14 15 24*  CREATININE 0.83 0.92 1.19  CALCIUM 8.6* 8.7* 8.7*  PROT 7.4 8.0  --   ALBUMIN 3.4* 3.6  --   AST 24 21  --   ALT 22 24  --   ALKPHOS 42 46  --   BILITOT 0.8 1.1  --   GFRNONAA >60 >60 >60  ANIONGAP 7 10 10      Hematology Recent Labs  Lab 04/26/23 1440 04/26/23 2125 04/27/23 0833 04/28/23 0452  WBC 6.9  --  7.7 7.6  RBC 2.98* 3.13* 3.23* 3.00*  HGB 9.4*  --  10.3* 9.5*  HCT 31.2*  --  33.7* 31.5*  MCV 104.7*  --  104.3* 105.0*  MCH 31.5  --  31.9 31.7  MCHC 30.1  --  30.6 30.2  RDW  14.6  --  14.6 14.6  PLT 264  --  280 271    Cardiac EnzymesNo results for input(s): "TROPONINI" in the last 168 hours. No results for input(s): "TROPIPOC" in the last 168 hours.   BNP Recent Labs  Lab 04/26/23 1441  BNP 364.1*     DDimer  Recent Labs  Lab 04/26/23 1440  DDIMER 1.53*     Radiology    ECHOCARDIOGRAM COMPLETE  Result Date: 04/27/2023    ECHOCARDIOGRAM REPORT   Patient Name:   Jason [name] Date of Exam: 04/27/2023 Medical Rec #:  [id number]       Height:       80.0 in Accession #:    [id number]      Weight:       293.7 lb Date of Birth:  1941-06-20        BSA:          2.707 m Patient Age:    82 years        BP:           146/73 mmHg Patient Gender: M               HR:           77 bpm. Exam Location:  Inpatient Procedure: 2D Echo, Cardiac Doppler and Color Doppler Indications:    Valvular Abnormality  History:        Patient has prior history of Echocardiogram examinations, most                 recent 08/12/2021. CHF, Mitral Valve Disease and Mitral Valve                 Prolapse, Arrythmias:Atrial Flutter, Signs/Symptoms:Murmur; Risk                  Factors:Diabetes.  Sonographer:    Darlys Gales Referring Phys: [id number] ANASTASSIA DOUTOVA IMPRESSIONS  1. Left ventricular ejection fraction, by estimation, is 60 to 65%. The left ventricle has normal function. The left ventricle has no regional wall motion abnormalities. There is severe asymmetric left ventricular hypertrophy of the septal segment. Left  ventricular diastolic parameters are indeterminate. There is the interventricular septum is flattened in diastole ('D' shaped left ventricle), consistent with right ventricular volume overload.  2. Right ventricular systolic function is normal. The right ventricular size is not well visualized. There is mildly elevated pulmonary artery systolic pressure. The estimated right ventricular systolic pressure is 43.3 mmHg.  3. Left atrial size was moderately dilated.  4. MVA by continuity equation < 1.5 cm2; unable to direct planimeter valve. There is minimal forward movement, extensive calcification and thickneing with relative sparing of the subvalvular apparatus. Wilkins score 14; not amenable to balloon intervention . The mitral valve is rheumatic. Mild mitral valve regurgitation. Severe mitral stenosis. The mean mitral valve gradient is 22.0 mmHg with average heart rate of 77 bpm.  5. The aortic valve was not well visualized. Aortic valve regurgitation is not visualized. No aortic stenosis is present.  6. The inferior vena cava is dilated in size with <50% respiratory variability, suggesting right atrial pressure of 15 mmHg. Comparison(s): Prior images reviewed side by side. Progression of mitral stenosis, mitral regurgitation is less than on prior TEE assessment. FINDINGS  Left Ventricle: Left ventricular ejection fraction, by estimation, is 60 to 65%. The left ventricle has normal function. The left ventricle has no regional wall motion abnormalities. The left ventricular internal cavity size was normal in  size. There is  severe asymmetric left ventricular  hypertrophy of the septal segment. The interventricular septum is flattened in diastole ('D' shaped left ventricle), consistent with right ventricular volume overload. Left ventricular diastolic parameters are indeterminate. Right Ventricle: The right ventricular size is not well visualized. No increase in right ventricular wall thickness. Right ventricular systolic function is normal. There is mildly elevated pulmonary artery systolic pressure. The tricuspid regurgitant velocity is 2.66 m/s, and with an assumed right atrial pressure of 15 mmHg, the estimated right ventricular systolic pressure is 43.3 mmHg. Left Atrium: Left atrial size was moderately dilated. Right Atrium: Right atrial size was normal in size. Pericardium: There is no evidence of pericardial effusion. Mitral Valve: MVA by continuity equation < 1.5 cm2; unable to direct planimeter valve. There is minimal forward movement, extensive calcification and thickneing with relative sparing of the subvalvular apparatus. Wilkins score 14; not amenable to balloon  intervention. The mitral valve is rheumatic. Mild mitral valve regurgitation. Severe mitral valve stenosis. MV peak gradient, 30.8 mmHg. The mean mitral valve gradient is 22.0 mmHg with average heart rate of 77 bpm. Tricuspid Valve: The tricuspid valve is grossly normal. Tricuspid valve regurgitation is not demonstrated. No evidence of tricuspid stenosis. Aortic Valve: The aortic valve was not well visualized. There is mild aortic valve annular calcification. Aortic valve regurgitation is not visualized. No aortic stenosis is present. Aortic valve mean gradient measures 7.0 mmHg. Aortic valve peak gradient measures 13.1 mmHg. Aortic valve area, by VTI measures 1.80 cm. Pulmonic Valve: The pulmonic valve was not well visualized. Pulmonic valve regurgitation is not visualized. No evidence of pulmonic stenosis. Aorta: The aortic root is normal in size and structure. Venous: The inferior vena cava is  dilated in size with less than 50% respiratory variability, suggesting right atrial pressure of 15 mmHg. IAS/Shunts: No atrial level shunt detected by color flow Doppler.  LEFT VENTRICLE PLAX 2D LVIDd:         4.20 cm   Diastology LVIDs:         2.70 cm   LV e' medial:  3.37 cm/s LV PW:         1.30 cm   LV e' lateral: 4.90 cm/s LV IVS:        1.30 cm LVOT diam:     2.00 cm LV SV:         53 LV SV Index:   19 LVOT Area:     3.14 cm  RIGHT VENTRICLE RV S prime:     13.50 cm/s TAPSE (M-mode): 2.0 cm LEFT ATRIUM              Index        RIGHT ATRIUM           Index LA Vol (A2C):   131.0 ml 48.39 ml/m  RA Area:     21.80 cm LA Vol (A4C):   80.9 ml  29.88 ml/m  RA Volume:   67.20 ml  24.82 ml/m LA Biplane Vol: 111.0 ml 41.00 ml/m  AORTIC VALVE AV Area (Vmax):    1.53 cm AV Area (Vmean):   1.50 cm AV Area (VTI):     1.80 cm AV Vmax:           181.00 cm/s AV Vmean:          125.000 cm/s AV VTI:            0.294 m AV Peak Grad:      13.1 mmHg AV  Mean Grad:      7.0 mmHg LVOT Vmax:         87.90 cm/s LVOT Vmean:        59.700 cm/s LVOT VTI:          0.168 m LVOT/AV VTI ratio: 0.57 MITRAL VALVE              TRICUSPID VALVE MV Area VTI:  0.62 cm    TR Peak grad:   28.3 mmHg MV Peak grad: 30.8 mmHg   TR Vmax:        266.00 cm/s MV Mean grad: 22.0 mmHg MV Vmax:      2.78 m/s    SHUNTS MV Vmean:     204.3 cm/s  Systemic VTI:  0.17 m                           Systemic Diam: 2.00 cm Riley Lam MD Electronically signed by Riley Lam MD Signature Date/Time: 04/27/2023/4:49:25 PM    Final    DG Foot 2 Views Right  Result Date: 04/27/2023 CLINICAL DATA:  82 year old male history of osteomyelitis status post amputation. EXAM: RIGHT FOOT - 2 VIEW COMPARISON:  Right foot radiograph 03/30/2023. FINDINGS: Postoperative changes of transmetatarsal amputation are again noted. Adjacent to the resected portions of the proximal metatarsals there is a small amount of new bone formation. A small amount of lucency  is noted in the lateral aspect of the resected fifth metatarsal. Gas noted in the soft tissues of the surgical stump on the prior examination has resolved, however, the soft tissues appear slightly more swollen than the prior examination. Skin staples remain in position. No acute displaced fracture or dislocation noted. Advanced degenerative changes of osteoarthritis throughout the midfoot and hindfoot. IMPRESSION: 1. Postoperative changes of transmetatarsal amputation redemonstrated with increasing soft tissue swelling, but resolution of gas in the soft tissues noted on the prior study. Some bony healing is noted along the resection, with a small amount of new bone formation. There is also a subtle lucency in the lateral aspect of the resected fifth metatarsal diaphysis, which could represent normal bony absorption, although osteomyelitis could have a similar appearance. Electronically Signed   By: Trudie Reed M.D.   On: 04/27/2023 05:28   DG Abd 1 View  Result Date: 04/26/2023 CLINICAL DATA:  Constipation. EXAM: ABDOMEN - 1 VIEW COMPARISON:  July 25, 2011 FINDINGS: The bowel gas pattern is normal. A mild stool burden is noted. Radiopaque surgical clips are seen within the right upper quadrant. No radio-opaque calculi or other significant radiographic abnormality are seen. IMPRESSION: Mild stool burden without evidence of bowel obstruction. Electronically Signed   By: Aram Candela M.D.   On: 04/26/2023 20:55   CT Angio Chest PE W and/or Wo Contrast  Result Date: 04/26/2023 CLINICAL DATA:  Hypoxia with recent surgery. Low oxygen saturation. Shortness of breath on exertion. EXAM: CT ANGIOGRAPHY CHEST WITH CONTRAST TECHNIQUE: Multidetector CT imaging of the chest was performed using the standard protocol during bolus administration of intravenous contrast. Multiplanar CT image reconstructions and MIPs were obtained to evaluate the vascular anatomy. RADIATION DOSE REDUCTION: This exam was  performed according to the departmental dose-optimization program which includes automated exposure control, adjustment of the mA and/or kV according to patient size and/or use of iterative reconstruction technique. CONTRAST:  80mL OMNIPAQUE IOHEXOL 350 MG/ML SOLN COMPARISON:  Chest radiograph 04/26/2023 FINDINGS: Cardiovascular: Technically adequate study with moderately good opacification of the central  and segmental pulmonary arteries. No focal filling defects are identified. No evidence of significant pulmonary embolus. Cardiac enlargement. No pericardial effusion. Normal caliber thoracic aorta. No aortic dissection. Calcification of the aorta and coronary arteries. Mediastinum/Nodes: Thyroid gland is unremarkable. Esophagus is decompressed. Mediastinal lymphadenopathy with numerous lymph nodes throughout all quadrants of the mediastinum and extending up into the supraclavicular base of neck. Largest right paratracheal nodes measure up to about 1.7 cm short axis dimension. Etiology is nonspecific, possibly reactive or inflammatory. Lymphoproliferative process is not excluded. Clinical correlation is recommended. Lungs/Pleura: Moderate bilateral pleural effusions with bilateral basilar consolidation or atelectasis. No pneumothorax. Upper Abdomen: No acute abnormalities demonstrated in the visualized upper abdomen. Musculoskeletal: Degenerative changes in the spine. No acute bony abnormalities. Review of the MIP images confirms the above findings. IMPRESSION: 1. No evidence of significant pulmonary embolus. 2. Cardiac enlargement. 3. Aortic atherosclerosis. 4. Moderate bilateral pleural effusions with bilateral basilar consolidation, possibly pneumonia or compressive atelectasis. Electronically Signed   By: Burman Nieves M.D.   On: 04/26/2023 18:21   DG Chest 2 View  Result Date: 04/26/2023 CLINICAL DATA:  Cough and hypoxia. Low oxygen saturation. Shortness of breath on exertion. EXAM: CHEST - 2 VIEW  COMPARISON:  10/14/2016 FINDINGS: Shallow inspiration. Mild cardiac enlargement. Bilateral perihilar and basilar infiltrates with small bilateral pleural effusions, progressing since prior study. No pneumothorax. Mediastinal contours appear intact. IMPRESSION: Cardiac enlargement with small bilateral pleural effusions and perihilar/basilar infiltrates, likely edema although pneumonia could have this appearance as well. Electronically Signed   By: Burman Nieves M.D.   On: 04/26/2023 16:25    Cardiac Studies    Echo:    1. Left ventricular ejection fraction, by estimation, is 60 to 65%. The  left ventricle has normal function. The left ventricle has no regional  wall motion abnormalities. There is severe asymmetric left ventricular  hypertrophy of the septal segment. Left   ventricular diastolic parameters are indeterminate. There is the  interventricular septum is flattened in diastole ('D' shaped left  ventricle), consistent with right ventricular volume overload.   2. Right ventricular systolic function is normal. The right ventricular  size is not well visualized. There is mildly elevated pulmonary artery  systolic pressure. The estimated right ventricular systolic pressure is  43.3 mmHg.   3. Left atrial size was moderately dilated.   4. MVA by continuity equation < 1.5 cm2; unable to direct planimeter  valve. There is minimal forward movement, extensive calcification and  thickneing with relative sparing of the subvalvular apparatus. Wilkins  score 14; not amenable to balloon  intervention . The mitral valve is rheumatic. Mild mitral valve  regurgitation. Severe mitral stenosis. The mean mitral valve gradient is  22.0 mmHg with average heart rate of 77 bpm.   5. The aortic valve was not well visualized. Aortic valve regurgitation  is not visualized. No aortic stenosis is present.   6. The inferior vena cava is dilated in size with <50% respiratory  variability, suggesting right  atrial pressure of 15 mmHg.    Patient Profile     82 y.o. male with a hx of mitral valve stenosis, mitral valve prolapse and MR, atrial flutter, HLD, type 2DM who is being seen 04/27/2023 for the evaluation of CHF, mitral valve stenosis/regurgitation at the request of Dr. Rito Ehrlich.   Assessment & Plan    MS/MR:  No plan for further inpatient evaluation of this.    He would be high risk for surgery given his previous multiple medical problems  and medication non adherence.   Net negative 2400.   Continue IV diuresis today and likely change to PO in the AM.  Of note he says now that he would not want surgery for his heart valve.  He has some baseline confusion but seems clear about this decision.  We will continue to manage his volume with diuretics.    Atrial flutter:   Rate is controlled and he is back on Xarelto.  No change in therapy.     For questions or updates, please contact [facility] Please consult [email or web address] for contact info under Cardiology/STEMI.   Signed, [name], MD  04/28/2023, 12:27 PM

## 2023-04-29 DIAGNOSIS — R4189 Other symptoms and signs involving cognitive functions and awareness: Secondary | ICD-10-CM | POA: Diagnosis not present

## 2023-04-29 DIAGNOSIS — I5031 Acute diastolic (congestive) heart failure: Secondary | ICD-10-CM | POA: Diagnosis not present

## 2023-04-29 DIAGNOSIS — Z89431 Acquired absence of right foot: Secondary | ICD-10-CM | POA: Diagnosis not present

## 2023-04-29 DIAGNOSIS — I70209 Unspecified atherosclerosis of native arteries of extremities, unspecified extremity: Secondary | ICD-10-CM | POA: Diagnosis not present

## 2023-04-29 DIAGNOSIS — E1151 Type 2 diabetes mellitus with diabetic peripheral angiopathy without gangrene: Secondary | ICD-10-CM | POA: Diagnosis not present

## 2023-04-29 DIAGNOSIS — Z7189 Other specified counseling: Secondary | ICD-10-CM

## 2023-04-29 LAB — BASIC METABOLIC PANEL
Anion gap: 8 (ref 5–15)
BUN: 22 mg/dL (ref 8–23)
CO2: 37 mmol/L — ABNORMAL HIGH (ref 22–32)
Calcium: 8.6 mg/dL — ABNORMAL LOW (ref 8.9–10.3)
Chloride: 91 mmol/L — ABNORMAL LOW (ref 98–111)
Creatinine, Ser: 0.96 mg/dL (ref 0.61–1.24)
GFR, Estimated: >60 mL/min (ref >60)
Glucose, Bld: 105 mg/dL — ABNORMAL HIGH (ref 70–99)
Potassium: 3.9 mmol/L (ref 3.5–5.1)
Sodium: 136 mmol/L (ref 135–145)

## 2023-04-29 LAB — MAGNESIUM: Magnesium: 1.9 mg/dL (ref 1.7–2.4)

## 2023-04-29 LAB — GLUCOSE, CAPILLARY
Glucose-Capillary: 85 mg/dL (ref 70–99)
Glucose-Capillary: 162 mg/dL — ABNORMAL HIGH (ref 70–99)
Glucose-Capillary: 147 mg/dL — ABNORMAL HIGH (ref 70–99)
Glucose-Capillary: 244 mg/dL — ABNORMAL HIGH (ref 70–99)
Glucose-Capillary: 241 mg/dL — ABNORMAL HIGH (ref 70–99)

## 2023-04-29 NOTE — Consult Note (Signed)
Consultation Note Date: 04/29/2023   Patient Name: Jason [name]  DOB: [birth year]  MRN: [id number]  Age / Sex: 82 y.o., male  PCP: [name], MD Referring Physician: Glade Lloyd, MD  Reason for Consultation:  goals of care  HPI/Patient Profile: 82 y.o. male  with past medical history of dementia (not formally diagnosed- per spouse's report and occupational therapy, diabetes, hyperlipidemia, anemia, PAD, a flutter, mitral valve prolapse, mitral stenosis, rheumatic fever, foot abscess and amputation of R forefoot- admitted on 04/26/2023 with shortness of breath, admitted and diuresed for CHF.   Primary Decision Maker NEXT OF KIN - spouse  Discussion: Chart reviewed including labs, progress notes, imaging from this and previous encounters.  Met with patient- no family at bedside. He shares he is from [place], married, has 2 children. 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  He is unable to elaborate to me on his health history. He tells me he is in the hospital due to his foot amputation. When asked about his heart failure he discussed his history of rheumatic fever, but cannot recall he his currently admitted for heart failure exacerbation or what treatment he is receiving, even when reminded.  He admits to memory loss. Says he tries to repeat things over and over to remember them. He was unable to participate in goals of care discussion.  I spoke with his spouse- she noted that he was having episodes of getting lost while driving. The police had to bring him home one day.  Per chart review he frequently forgets his medications and puts weight on his foot due to forgetting he's not supposed to- or that he needs to wear his boot. His spouse reports she has overseen his medication administration for the last two weeks to ensure he was compliant.  They met previously  with cardiology and he was to have dental work done and take his medications correctly so that he could have surgery for his mitral valve stenosis. Spouse would want him to have surgery if it is offered. Cardiology consulted this admission notes patient is high risk fir surgery d/t previous multiple medical problems and medication nonadherence. Spouse shared that caring for him at home has been difficult and feels he needs higher level of care. She has selected [facility] for patient to go to for rehab after discharge. She has considered and is looking at assisted living/senior care communities for herself and Mr. Chairs, recognizing the need for higher level of care for both of them.  We discussed advanced care planning- Mr. Reierson doesn't have advanced directives however, they have attended meetings and spoken with each other about their wishes. He would not want to be put on life support. He would not want to be resuscitated if he stopped breathing and his heart stopped and he died. I recommended a DNR order and she is in agreement with this.  We discussed the trajectory of both heart failure and dementia. Recommended  continued conversations with medical providers regarding scope of  medical interventions within the context of patient's goals of care.      SUMMARY OF RECOMMENDATIONS -Continue current plan of care -To d/c to SNF for rehab -DNR- full scope otherwise    Code Status/Advance Care Planning: DNR   Prognosis:   Unable to determine  Discharge Planning: To Be Determined  Primary Diagnoses: Present on Admission:  Acute diastolic CHF (congestive heart failure) (HCC)  Diabetes mellitus type 2 with atherosclerosis of arteries of extremities (HCC)  Atrial flutter (HCC)  Acute respiratory failure with hypoxia (HCC)  Hyperlipidemia  COPD (chronic obstructive pulmonary disease) (HCC)  Normocytic anemia  Hyponatremia  Elevated INR   Review of Systems  Physical Exam  Vital Signs: BP  (!) 123/53 (BP Location: Left Arm)   Pulse 79   Temp 98.4 F (36.9 C) (Oral)   Resp 18   Ht 6\' 8"  (2.032 m)   Wt 131.5 kg   SpO2 98%   BMI 31.85 kg/m  Pain Scale: 0-10   Pain Score: 0-No pain   SpO2: SpO2: 98 % O2 Device:SpO2: 98 % O2 Flow Rate: .O2 Flow Rate (L/min): 1 L/min  IO: Intake/output summary:  Intake/Output Summary (Last 24 hours) at 04/29/2023 1443 Last data filed at 04/29/2023 1247 Gross per 24 hour  Intake 500 ml  Output 4250 ml  Net -3750 ml    LBM: Last BM Date : 04/25/23 Baseline Weight: Weight: 133.2 kg Most recent weight: Weight: 131.5 kg       Thank you for this consult. Palliative medicine will continue to follow and assist as needed.  Time Total: 90 minutes Greater than 50%  of this time was spent counseling and coordinating care related to the above assessment and plan.  Signed by: Ocie Bob, AGNP-C Palliative Medicine    Please contact Palliative Medicine Team phone at [phone] for questions and concerns.  For individual provider: See Loretha Stapler

## 2023-04-29 NOTE — Care Management Important Message (Signed)
Important Message  Patient Details IM Letter given. Name: Jason [name] MRN: [id number] Date of Birth: February 12, 1941   Medicare Important Message Given:  Yes     Caren Macadam 04/29/2023, 4:09 PM

## 2023-04-29 NOTE — Consult Note (Addendum)
Subjective:  Patient ID: Jason [name], male    DOB: 04-14-1941,  MRN: [id number]  Patient with past medical history of Type 2 DM s/p TMA, mitral sensotis, Hld, bladder cancer, COPD and sleep apnes seen at beside today for evaluation of left TMA site and recommendations for wound care and staple removal. Patient known to the practice and last seen by Dr. Annamary Rummage for post-op. Patient relates has not been changing the dressing. Denies pain. Denis n/c/f/v.Marland Kitchen   Past Medical History:  Diagnosis Date   Anxiety    Asthma    in past, no inhaler now   Benign neoplasm of colon    Bone infection (HCC)    Cancer (HCC)    bladder cancer   COPD (chronic obstructive pulmonary disease) (HCC)    Depression    Diverticulosis of colon (without mention of hemorrhage)    DJD (degenerative joint disease)    Esophageal reflux    Glaucoma    Hidradenitis    History of BPH    History of gynecomastia    Unilateral   Hyperlipidemia    Irritable bowel syndrome    Memory loss    Mitral stenosis    mild-moderate MS 01/2016   MVP (mitral valve prolapse)    Obesity, unspecified    Pneumonia    Sleep apnea    does not use  cpap or bipap .. no study  ever   Type II or unspecified type diabetes mellitus without mention of complication, not stated as uncontrolled    Unspecified venous (peripheral) insufficiency      Past Surgical History:  Procedure Laterality Date   ACHILLES TENDON SURGERY Right 05/17/2019   Procedure: ACHILLES LENGTHENING/KIDNER;  Surgeon: Park Liter, DPM;  Location: [facility];  Service: Podiatry;  Laterality: Right;   AMPUTATION  08/09/2012   Procedure: AMPUTATION DIGIT;  Surgeon: Nadara Mustard, MD;  Location: [facility] OR;  Service: Orthopedics;  Laterality: Right;  Amputation right Great Toe MTP Joint   AMPUTATION  10/17/2012   Procedure: AMPUTATION DIGIT;  Surgeon: Nadara Mustard, MD;  Location: [facility] OR;  Service: Orthopedics;  Laterality: Right;  Right Foot 3rd Toe Amputation at  Metatarsophalangeal Joint   AMPUTATION Right 03/30/2023   Procedure: TRANSMETATARSAL AMPUTATION, RIGHT FOOT;  Surgeon: Pilar Plate, DPM;  Location: [facility];  Service: Podiatry;  Laterality: Right;   AMPUTATION TOE Right 05/17/2019   Procedure: AMPUTATION TOE Metatarsal Phalangeal Joint   FOURTH AND FIFTH, FILLETED TOE FLAP;  Surgeon: Park Liter, DPM;  Location: [facility] OR;  Service: Podiatry;  Laterality: Right;   BUBBLE STUDY  08/12/2021   Procedure: BUBBLE STUDY;  Surgeon: Sande Rives, MD;  Location: [facility]COPY;  Service: Cardiovascular;;   CHOLECYSTECTOMY N/A 06/14/2018   Procedure: LAPAROSCOPIC CHOLECYSTECTOMY WITH INTRAOPERATIVE CHOLANGIOGRAM;  Surgeon: Rodman Pickle, MD;  Location: [facility];  Service: General;  Laterality: N/A;   CIRCUMCISION  06/2100   For Phimosis - Dr Marcello Fennel   COLON SURGERY     CYSTOSCOPY W/ RETROGRADES Bilateral 07/07/2018   Procedure: CYSTOSCOPY WITH BILATERAL RETROGRADE PYELOGRAM;  Surgeon: Crist Fat, MD;  Location: [facility];  Service: Urology;  Laterality: Bilateral;   KNEE ARTHROSCOPY     LAPAROSCOPIC LYSIS OF ADHESIONS  06/14/2018   Procedure: LAPAROSCOPIC LYSIS OF ADHESIONS;  Surgeon: Sheliah Hatch De Blanch, MD;  Location: [facility];  Service: General;;   LAPAROTOMY  07/20/11   LASER ABLATION  06/2009   Left Greater Saphenous vein -Dr Jenean Lindau  OSTEOTOMY Right 05/17/2019   Procedure: METATARSLSECTOMY FOURTH DIGIT;  Surgeon: Park Liter, DPM;  Location: [facility] OR;  Service: Podiatry;  Laterality: Right;   MULTIPLE TOOTH EXTRACTIONS     TEE WITHOUT CARDIOVERSION N/A 08/12/2021   Procedure: TRANSESOPHAGEAL ECHOCARDIOGRAM (TEE);  Surgeon: Sande Rives, MD;  Location: [facility] ENDOSCOPY;  Service: Cardiovascular;  Laterality: N/A;   TOE AMPUTATION  06/2009   x3Right foot second toe -Dr Lestine Box   TONSILLECTOMY     TRANSURETHRAL RESECTION OF BLADDER TUMOR N/A 07/07/2018   Procedure: TRANSURETHRAL RESECTION OF BLADDER TUMOR  (TURBT) WITH POST OP INSTILLATION OF GEMCITABIN;  Surgeon: Crist Fat, MD;  Location: [facility];  Service: Urology;  Laterality: N/A;   WOUND DEBRIDEMENT Right 07/25/2019   Procedure: Right foot wound debridement with application of skin graft substitute;  Surgeon: Park Liter, DPM;  Location: [facility];  Service: Podiatry;  Laterality: Right;       Latest Ref Rng & Units 04/28/2023    4:52 AM 04/27/2023    8:33 AM 04/26/2023    2:40 PM  CBC  WBC 4.0 - 10.5 K/uL 7.6  7.7  6.9   Hemoglobin 13.0 - 17.0 g/dL 9.5  16.1  9.4   Hematocrit 39.0 - 52.0 % 31.5  33.7  31.2   Platelets 150 - 400 K/uL 271  280  264        Latest Ref Rng & Units 04/29/2023    4:20 AM 04/28/2023    4:52 AM 04/27/2023    8:33 AM  BMP  Glucose 70 - 99 mg/dL 096  91  045   BUN 8 - 23 mg/dL 22  24  15    Creatinine 0.61 - 1.24 mg/dL 4.09  8.11  9.14   Sodium 135 - 145 mmol/L 136  137  135   Potassium 3.5 - 5.1 mmol/L 3.9  4.7  4.7   Chloride 98 - 111 mmol/L 91  94  95   CO2 22 - 32 mmol/L 37  33  30   Calcium 8.9 - 10.3 mg/dL 8.6  8.7  8.7      Objective:   Vitals:   04/29/23 0432 04/29/23 1245  BP: 109/68 (!) 123/53  Pulse: 87 79  Resp: 18 18  Temp: 98.5 F (36.9 C) 98.4 F (36.9 C)  SpO2: 96% 98%    General:AA&O x 3. Normal mood and affect   Vascular: DP and PT pulses 2/4 bilateral. Brisk capillary refill to all digits. Pedal hair present   Neruological. Epicritic sensation grossly intact.   Derm: Right distal TMA site with central dehiscence noted to be superficial and granular. Residual staples no longer provide support central. Removed today.  No erythema edema or purulence noted.   MSK: MMT 5/5 in dorsiflexion, plantar flexion, inversion and eversion. Normal joint ROM without pain or crepitus.       Assessment & Plan:  Patient was evaluated and treated and all questions answered.  DX: s/p Right transmetatarsal amputation with wound dehiscense  Wound care: betadine, DSD   Doxycycline and augmentin  DME: NWB in CAM boot   Wound evaluated and central staples removed and lightly debrided excisionally down to subcutaneous tissue removing all necrotic debris. .  Foot redressed  Recommend daily WTD betadine dressing changes.  Patient in agreement with plan and all questions answered.  Podiatry will sign off at this time and will follow in clinic outpatient.   Louann Sjogren, DPM  Accessible via secure chat for questions  or concerns.

## 2023-04-29 NOTE — TOC Progression Note (Signed)
Transition of Care [facility]) - Progression Note    Patient Details  Name: KIRK ZAJDEL MRN: [id number] Date of Birth: 16-Nov-1940  Transition of Care [facility]) CM/SW Contact  Larrie Kass, LCSW Phone Number: 04/29/2023, 5:12 PM  Clinical Narrative:    CSW spoke with pt's wife she has chosen [employer]. Will need to start insurance auth. TOC to follow.   Expected Discharge Plan: Skilled Nursing Facility Barriers to Discharge: Continued Medical Work up  Expected Discharge Plan and Services       Living arrangements for the past 2 months: Single Family Home                                       Social Determinants of Health (SDOH) Interventions SDOH Screenings   Food Insecurity: No Food Insecurity (04/26/2023)  Housing: Medium Risk (04/26/2023)  Transportation Needs: No Transportation Needs (04/26/2023)  Utilities: Not At Risk (04/26/2023)  Alcohol Screen: Low Risk  (06/01/2022)  Depression (PHQ2-9): Low Risk  (06/01/2022)  Financial Resource Strain: Low Risk  (06/01/2022)  Physical Activity: Sufficiently Active (06/01/2022)  Social Connections: Socially Integrated (06/01/2022)  Stress: Stress Concern Present (06/01/2022)  Tobacco Use: Medium Risk (04/26/2023)    Readmission Risk Interventions     No data to display

## 2023-04-29 NOTE — Inpatient Diabetes Management (Signed)
Inpatient Diabetes Program Recommendations  AACE/ADA: New Consensus Statement on Inpatient Glycemic Control (2015)  Target Ranges:  Prepandial:   less than 140 mg/dL      Peak postprandial:   less than 180 mg/dL (1-2 hours)      Critically ill patients:  140 - 180 mg/dL   Lab Results  Component Value Date   GLUCAP 162 (H) 04/29/2023   HGBA1C 5.8 (H) 04/26/2023    Review of Glycemic Control   Inpatient Diabetes Program Recommendations:    Discharge Recommendations: Long acting recommendations: Insulin Glargine (TOUJEO) 300 Units/mL Solostar Pen 10 units QD  Short acting recommendations:  Correction coverage ONLY Insulin aspart (NOVOLOG) FlexPen  Moderate Scale.   Hypoglycemia treatment recommendations: GVoke 0.5mg  Supply/Referral recommendations: Pen needles - standard   Thank you. Ailene Ards, RD, LDN, CDCES Inpatient Diabetes Coordinator [phone]

## 2023-04-29 NOTE — Progress Notes (Signed)
Physical Therapy Treatment [name] MRN: [id number] DOB: Apr 04, 1941 Today's Date: 04/29/2023   History of Present Illness 82 yo male admitted with acute on chronic CHF, acute respiratory failure. 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 Other hx: CHF, Dm, mital stenosis, Aflutter bladder Ca, peripheral insufficiency, OSA, memory loss, osteomyelitis    PT Comments    General Comments: dementia at baseline but pleasant and following all commands.  Able to share his home situation.  Pt aware he is "not suppose" to weight bear through R LE but "I do it any way", stated pt.  "I can't walk without putting some weight on it".  "hopping is for rabits" pt laughed. Assisted OOB to [facility] went well.  Emergency planning/management officer.  Re Educated Pt NWBing. General bed mobility comments: pt self able requiring no physical Assist.  General transfer comment: assisted from elevated bed to [facility].  Pt non compliant with NWB despite VC's.  Pt self able to rise and safely transfer using B UE's to steady self.  Assisted back to bed.  General Gait Details: Therapist deferred amb due to Pt's non compliance with NWBing Pt lives home with Spouse.  Pt will need ST Rehab at SNF to address mobility and functional decline prior to safely returning home.   Recommendations for follow up therapy are one component of a multi-disciplinary discharge planning process, led by the attending physician.  Recommendations may be updated based on patient status, additional functional criteria and insurance authorization.  Follow Up Recommendations       Assistance Recommended at Discharge Frequent or constant Supervision/Assistance  Patient can return home with the following Assist for transportation;Help with stairs or ramp for entrance;Assistance with cooking/housework;A little help with walking and/or transfers;A little help with bathing/dressing/bathroom   Equipment  Recommendations  None recommended by PT    Recommendations for Other Services       Precautions / Restrictions Precautions Precautions: Fall Required Braces or Orthoses: Other Brace Other Brace: CAM boot in room Restrictions Weight Bearing Restrictions: Yes RLE Weight Bearing: Non weight bearing Other Position/Activity Restrictions: pt has been noncompliant with NWB status     Mobility  Bed Mobility Overal bed mobility: Needs Assistance Bed Mobility: Supine to Sit, Sit to Supine     Supine to sit: Supervision Sit to supine: Supervision   General bed mobility comments: pt self able requiring no physical Assist    Transfers Overall transfer level: Needs assistance Equipment used: Rolling walker (2 wheels) Transfers: Sit to/from Stand Sit to Stand: Min guard, Min assist           General transfer comment: assisted from elevated bed to [facility].  Pt non compliant with NWB despite VC's.  Pt self able to rise and safely transfer using B UE's to steady self.  Assisted back to bed.    Ambulation/Gait               General Gait Details: Therapist deferred amb due to Pt's non compliance with NWBing   Stairs             Wheelchair Mobility    Modified Rankin (Stroke Patients Only)       Balance                                            Cognition Arousal/Alertness:  Awake/alert Behavior During Therapy: WFL for tasks assessed/performed Overall Cognitive Status: History of cognitive impairments - at baseline                                 General Comments: dementia at baseline but pleasant and following all commands.  Able to share his home situation.  Pt aware he is "not suppose" to weight bear through R LE but "I do it any way", stated pt.  "I can't walk without putting some weight on it".  "hopping is for rabits" pt laughed.        Exercises      General Comments        Pertinent Vitals/Pain Pain  Assessment Pain Assessment: No/denies pain Faces Pain Scale: No hurt    Home Living                          Prior Function            PT Goals (current goals can now be found in the care plan section) Progress towards PT goals: Progressing toward goals    Frequency    Min 1X/week      PT Plan Current plan remains appropriate    Co-evaluation              AM-PAC PT "6 Clicks" Mobility   Outcome Measure  Help needed turning from your back to your side while in a flat bed without using bedrails?: None Help needed moving from lying on your back to sitting on the side of a flat bed without using bedrails?: None Help needed moving to and from a bed to a chair (including a wheelchair)?: A Little Help needed standing up from a chair using your arms (e.g., wheelchair or bedside chair)?: A Little Help needed to walk in hospital room?: A Lot Help needed climbing 3-5 steps with a railing? : Total 6 Click Score: 17    End of Session Equipment Utilized During Treatment: Gait belt Activity Tolerance: Patient tolerated treatment well Patient left: in bed;with call bell/phone within reach;with bed alarm set Nurse Communication: Mobility status PT Visit Diagnosis: Difficulty in walking, not elsewhere classified (R26.2)     Time: 1030-1055 PT Time Calculation (min) (ACUTE ONLY): 25 min  Charges:  $Therapeutic Activity: 23-37 mins                     Felecia Shelling  PTA Acute  Rehabilitation Services Office M-F          [phone]

## 2023-04-29 NOTE — Progress Notes (Signed)
PROGRESS NOTE    Jason [name]  ZOX:[id number] DOB: [birth year] DOA: 04/26/2023 PCP: [name], MD   Brief Narrative:  82 y.o. male with medical history significant of COPD,  DM2; status post right forefoot amputation recently and currently on oral antibiotics, severe mitral stenosis and regurgitation, history of atrial flutter on Xarelto, HLD, bladder cancer, sleep apnea presented with worsening shortness of breath.  He was noted to be hypoxic with saturations of 86% on room air.  He was subsequently admitted for acute diastolic CHF and started on IV Lasix.  Cardiology was consulted.  Assessment & Plan:   Acute respiratory failure with hypoxia Acute diastolic CHF Severe mitral stenosis; mild mitral regurgitation -Continue IV Lasix; strict input and output.  Daily weights.  Fluid restriction.  Cardiology following.  Follow recommendations -Negative balance of 7567.5 cc since admission -Respiratory status improving: Currently still on 1 L oxygen by nasal cannula.  Wean off as able.  Paroxysmal atrial flutter -Currently rate controlled.  Continue Xarelto.  Status post transmetatarsal amputation right foot Seen by podiatry recently.  There was some concern for infection.  Surgery was a few weeks ago.  Patient was started on Augmentin and doxycycline by podiatry.  Continue same.  Follow-up with podiatry in the outpatient setting.  ESR was noted to be 50.  CRP less than 0.5.   Diabetes mellitus type 2 with hypoglycemia -Monitor CBGs.  Continue with SSI.  Continue decreased dose of long-acting insulin.  Carb modified diet.  Hyperlipidemia -Continue with statin.   History of COPD -Respiratory status is stable. -No evidence for COPD exacerbation.   Normocytic anemia -Stable.   Hyponatremia -Improved.   Obesity -Outpatient follow-up  Goals of care -Overall prognosis is guarded to poor.  Palliative care consultation for goals of care discussion.  Currently listed as full  code  Physical deconditioning -PT recommended SNF placement.  TOC following.    DVT prophylaxis: Xarelto Code Status: Full Family Communication: None at bedside Disposition Plan: Status is: Inpatient Remains inpatient appropriate because: Of severity of illness.  Possible discharge to SNF in 1 to 2 days if cleared by cardiology  Consultants: Cardiology.  palliative care  Procedures: None  Antimicrobials: Augmentin and doxycycline   Subjective: Patient seen and examined at bedside.  No agitation, fever, vomiting reported.  Patient is a poor historian. Objective: Vitals:   04/28/23 1231 04/28/23 1953 04/29/23 0425 04/29/23 0432  BP: 136/68 133/61 109/68 109/68  Pulse: 79 79 78 87  Resp: 18 19 18 18   Temp: 98 F (36.7 C) 98.6 F (37 C) 98.5 F (36.9 C) 98.5 F (36.9 C)  TempSrc: Oral Oral Oral Oral  SpO2: 96% 96% 96% 96%  Weight:   131.5 kg   Height:        Intake/Output Summary (Last 24 hours) at 04/29/2023 0804 Last data filed at 04/29/2023 0504 Gross per 24 hour  Intake 420 ml  Output 5125 ml  Net -4705 ml    Filed Weights   04/27/23 0612 04/28/23 0500 04/29/23 0425  Weight: 133.2 kg 134.4 kg 131.5 kg    Examination:  General: Currently on 1 L oxygen via nasal cannula.  No distress.  Looks chronically ill and deconditioned. ENT/neck: No thyromegaly.  JVD is not elevated  respiratory: Decreased breath sounds at bases bilaterally with basilar crackles; no wheezing  CVS: S1-S2 heard, rate controlled currently Abdominal: Soft, nontender, slightly distended; no organomegaly, bowel sounds are heard Extremities: Trace lower extremity edema; no cyanosis.  Right foot  has a dressing. CNS: Awake and alert.  Still very slow to respond.  Poor historian.  No focal neurologic deficit.  Moves extremities Lymph: No obvious lymphadenopathy Skin: No obvious ecchymosis/lesions  psych: Mostly flat affect.  Not agitated currently. musculoskeletal: No obvious joint  swelling/deformity     Data Reviewed: I have personally reviewed following labs and imaging studies  CBC: Recent Labs  Lab 04/26/23 1440 04/27/23 0833 04/28/23 0452  WBC 6.9 7.7 7.6  HGB 9.4* 10.3* 9.5*  HCT 31.2* 33.7* 31.5*  MCV 104.7* 104.3* 105.0*  PLT 264 280 271    Basic Metabolic Panel: Recent Labs  Lab 04/26/23 1440 04/26/23 2000 04/27/23 0833 04/28/23 0452 04/29/23 0420  NA 131*  --  135 137 136  K 4.5  --  4.7 4.7 3.9  CL 95*  --  95* 94* 91*  CO2 29  --  30 33* 37*  GLUCOSE 74  --  204* 91 105*  BUN 14  --  15 24* 22  CREATININE 0.83  --  0.92 1.19 0.96  CALCIUM 8.6*  --  8.7* 8.7* 8.6*  MG  --  1.8 2.2  --  1.9  PHOS  --  4.4 5.3*  --   --     GFR: Estimated Creatinine Clearance: 94.1 mL/min (by C-G formula based on SCr of 0.96 mg/dL). Liver Function Tests: Recent Labs  Lab 04/26/23 1440 04/27/23 0833  AST 24 21  ALT 22 24  ALKPHOS 42 46  BILITOT 0.8 1.1  PROT 7.4 8.0  ALBUMIN 3.4* 3.6    No results for input(s): "LIPASE", "AMYLASE" in the last 168 hours. No results for input(s): "AMMONIA" in the last 168 hours. Coagulation Profile: Recent Labs  Lab 04/26/23 1855  INR 2.3*    Cardiac Enzymes: Recent Labs  Lab 04/26/23 2000  CKTOTAL 87    BNP (last 3 results) No results for input(s): "PROBNP" in the last 8760 hours. HbA1C: Recent Labs    04/26/23 2125  HGBA1C 5.8*    CBG: Recent Labs  Lab 04/28/23 0842 04/28/23 1152 04/28/23 1708 04/28/23 1959 04/29/23 0730  GLUCAP 90 125* 138* 78 85    Lipid Profile: No results for input(s): "CHOL", "HDL", "LDLCALC", "TRIG", "CHOLHDL", "LDLDIRECT" in the last 72 hours. Thyroid Function Tests: Recent Labs    04/26/23 2125  TSH 0.771    Anemia Panel: Recent Labs    04/26/23 2125  VITAMINB12 250  FOLATE 18.5  FERRITIN 96  TIBC 316  IRON 44*  RETICCTPCT 6.8*    Sepsis Labs: Recent Labs  Lab 04/26/23 2000  PROCALCITON <0.10     Recent Results (from the past  240 hour(s))  Resp panel by RT-PCR (RSV, Flu A&B, Covid) Anterior Nasal Swab     Status: None   Collection Time: 04/26/23  2:40 PM   Specimen: Anterior Nasal Swab  Result Value Ref Range Status   SARS Coronavirus 2 by RT PCR NEGATIVE NEGATIVE Final    Comment: (NOTE) SARS-CoV-2 target nucleic acids are NOT DETECTED.  The SARS-CoV-2 RNA is generally detectable in upper respiratory specimens during the acute phase of infection. The lowest concentration of SARS-CoV-2 viral copies this assay can detect is 138 copies/mL. A negative result does not preclude SARS-Cov-2 infection and should not be used as the sole basis for treatment or other patient management decisions. A negative result may occur with  improper specimen collection/handling, submission of specimen other than nasopharyngeal swab, presence of viral mutation(s) within the areas targeted  by this assay, and inadequate number of viral copies(<138 copies/mL). A negative result must be combined with clinical observations, patient history, and epidemiological information. The expected result is Negative.  Fact Sheet for Patients:  BloggerCourse.com  Fact Sheet for Healthcare Providers:  SeriousBroker.it  This test is no t yet approved or cleared by the [place] FDA and  has been authorized for detection and/or diagnosis of SARS-CoV-2 by FDA under an Emergency Use Authorization (EUA). This EUA will remain  in effect (meaning this test can be used) for the duration of the COVID-19 declaration under Section 564(b)(1) of the Act, 21 U.S.C.section 360bbb-3(b)(1), unless the authorization is terminated  or revoked sooner.       Influenza A by PCR NEGATIVE NEGATIVE Final   Influenza B by PCR NEGATIVE NEGATIVE Final    Comment: (NOTE) The Xpert Xpress SARS-CoV-2/FLU/RSV plus assay is intended as an aid in the diagnosis of influenza from Nasopharyngeal swab specimens and should  not be used as a sole basis for treatment. Nasal washings and aspirates are unacceptable for Xpert Xpress SARS-CoV-2/FLU/RSV testing.  Fact Sheet for Patients: BloggerCourse.com  Fact Sheet for Healthcare Providers: SeriousBroker.it  This test is not yet approved or cleared by the [place] FDA and has been authorized for detection and/or diagnosis of SARS-CoV-2 by FDA under an Emergency Use Authorization (EUA). This EUA will remain in effect (meaning this test can be used) for the duration of the COVID-19 declaration under Section 564(b)(1) of the Act, 21 U.S.C. section 360bbb-3(b)(1), unless the authorization is terminated or revoked.     Resp Syncytial Virus by PCR NEGATIVE NEGATIVE Final    Comment: (NOTE) Fact Sheet for Patients: BloggerCourse.com  Fact Sheet for Healthcare Providers: SeriousBroker.it  This test is not yet approved or cleared by the [place] FDA and has been authorized for detection and/or diagnosis of SARS-CoV-2 by FDA under an Emergency Use Authorization (EUA). This EUA will remain in effect (meaning this test can be used) for the duration of the COVID-19 declaration under Section 564(b)(1) of the Act, 21 U.S.C. section 360bbb-3(b)(1), unless the authorization is terminated or revoked.  Performed at [facility], [address], [place], [place] [address]   MRSA Next Gen by PCR, Nasal     Status: None   Collection Time: 04/27/23 12:08 AM   Specimen: Nasal Mucosa; Nasal Swab  Result Value Ref Range Status   MRSA by PCR Next Gen NOT DETECTED NOT DETECTED Final    Comment: (NOTE) The GeneXpert MRSA Assay (FDA approved for NASAL specimens only), is one component of a comprehensive MRSA colonization surveillance program. It is not intended to diagnose MRSA infection nor to guide or monitor treatment for MRSA infections. Test  performance is not FDA approved in patients less than 19 years old. Performed at [facility], [address], [place], [place] [address]          Radiology Studies: ECHOCARDIOGRAM COMPLETE  Result Date: 04/27/2023    ECHOCARDIOGRAM REPORT   Patient Name:   EMILIAN HINNENKAMP Date of Exam: 04/27/2023 Medical Rec #:  [id number]       Height:       80.0 in Accession #:    [id number]      Weight:       293.7 lb Date of Birth:  05/09/1941        BSA:          2.707 m Patient Age:    15 years  BP:           146/73 mmHg Patient Gender: M               HR:           77 bpm. Exam Location:  Inpatient Procedure: 2D Echo, Cardiac Doppler and Color Doppler Indications:    Valvular Abnormality  History:        Patient has prior history of Echocardiogram examinations, most                 recent 08/12/2021. CHF, Mitral Valve Disease and Mitral Valve                 Prolapse, Arrythmias:Atrial Flutter, Signs/Symptoms:Murmur; Risk                 Factors:Diabetes.  Sonographer:    Darlys Gales Referring Phys: [id number] ANASTASSIA DOUTOVA IMPRESSIONS  1. Left ventricular ejection fraction, by estimation, is 60 to 65%. The left ventricle has normal function. The left ventricle has no regional wall motion abnormalities. There is severe asymmetric left ventricular hypertrophy of the septal segment. Left  ventricular diastolic parameters are indeterminate. There is the interventricular septum is flattened in diastole ('D' shaped left ventricle), consistent with right ventricular volume overload.  2. Right ventricular systolic function is normal. The right ventricular size is not well visualized. There is mildly elevated pulmonary artery systolic pressure. The estimated right ventricular systolic pressure is 43.3 mmHg.  3. Left atrial size was moderately dilated.  4. MVA by continuity equation < 1.5 cm2; unable to direct planimeter valve. There is minimal forward movement, extensive calcification and thickneing  with relative sparing of the subvalvular apparatus. Wilkins score 14; not amenable to balloon intervention . The mitral valve is rheumatic. Mild mitral valve regurgitation. Severe mitral stenosis. The mean mitral valve gradient is 22.0 mmHg with average heart rate of 77 bpm.  5. The aortic valve was not well visualized. Aortic valve regurgitation is not visualized. No aortic stenosis is present.  6. The inferior vena cava is dilated in size with <50% respiratory variability, suggesting right atrial pressure of 15 mmHg. Comparison(s): Prior images reviewed side by side. Progression of mitral stenosis, mitral regurgitation is less than on prior TEE assessment. FINDINGS  Left Ventricle: Left ventricular ejection fraction, by estimation, is 60 to 65%. The left ventricle has normal function. The left ventricle has no regional wall motion abnormalities. The left ventricular internal cavity size was normal in size. There is  severe asymmetric left ventricular hypertrophy of the septal segment. The interventricular septum is flattened in diastole ('D' shaped left ventricle), consistent with right ventricular volume overload. Left ventricular diastolic parameters are indeterminate. Right Ventricle: The right ventricular size is not well visualized. No increase in right ventricular wall thickness. Right ventricular systolic function is normal. There is mildly elevated pulmonary artery systolic pressure. The tricuspid regurgitant velocity is 2.66 m/s, and with an assumed right atrial pressure of 15 mmHg, the estimated right ventricular systolic pressure is 43.3 mmHg. Left Atrium: Left atrial size was moderately dilated. Right Atrium: Right atrial size was normal in size. Pericardium: There is no evidence of pericardial effusion. Mitral Valve: MVA by continuity equation < 1.5 cm2; unable to direct planimeter valve. There is minimal forward movement, extensive calcification and thickneing with relative sparing of the subvalvular  apparatus. Wilkins score 14; not amenable to balloon  intervention. The mitral valve is rheumatic. Mild mitral valve regurgitation. Severe mitral valve stenosis. MV peak gradient, 30.8  mmHg. The mean mitral valve gradient is 22.0 mmHg with average heart rate of 77 bpm. Tricuspid Valve: The tricuspid valve is grossly normal. Tricuspid valve regurgitation is not demonstrated. No evidence of tricuspid stenosis. Aortic Valve: The aortic valve was not well visualized. There is mild aortic valve annular calcification. Aortic valve regurgitation is not visualized. No aortic stenosis is present. Aortic valve mean gradient measures 7.0 mmHg. Aortic valve peak gradient measures 13.1 mmHg. Aortic valve area, by VTI measures 1.80 cm. Pulmonic Valve: The pulmonic valve was not well visualized. Pulmonic valve regurgitation is not visualized. No evidence of pulmonic stenosis. Aorta: The aortic root is normal in size and structure. Venous: The inferior vena cava is dilated in size with less than 50% respiratory variability, suggesting right atrial pressure of 15 mmHg. IAS/Shunts: No atrial level shunt detected by color flow Doppler.  LEFT VENTRICLE PLAX 2D LVIDd:         4.20 cm   Diastology LVIDs:         2.70 cm   LV e' medial:  3.37 cm/s LV PW:         1.30 cm   LV e' lateral: 4.90 cm/s LV IVS:        1.30 cm LVOT diam:     2.00 cm LV SV:         53 LV SV Index:   19 LVOT Area:     3.14 cm  RIGHT VENTRICLE RV S prime:     13.50 cm/s TAPSE (M-mode): 2.0 cm LEFT ATRIUM              Index        RIGHT ATRIUM           Index LA Vol (A2C):   131.0 ml 48.39 ml/m  RA Area:     21.80 cm LA Vol (A4C):   80.9 ml  29.88 ml/m  RA Volume:   67.20 ml  24.82 ml/m LA Biplane Vol: 111.0 ml 41.00 ml/m  AORTIC VALVE AV Area (Vmax):    1.53 cm AV Area (Vmean):   1.50 cm AV Area (VTI):     1.80 cm AV Vmax:           181.00 cm/s AV Vmean:          125.000 cm/s AV VTI:            0.294 m AV Peak Grad:      13.1 mmHg AV Mean Grad:      7.0  mmHg LVOT Vmax:         87.90 cm/s LVOT Vmean:        59.700 cm/s LVOT VTI:          0.168 m LVOT/AV VTI ratio: 0.57 MITRAL VALVE              TRICUSPID VALVE MV Area VTI:  0.62 cm    TR Peak grad:   28.3 mmHg MV Peak grad: 30.8 mmHg   TR Vmax:        266.00 cm/s MV Mean grad: 22.0 mmHg MV Vmax:      2.78 m/s    SHUNTS MV Vmean:     204.3 cm/s  Systemic VTI:  0.17 m                           Systemic Diam: 2.00 cm Riley Lam MD Electronically signed by Riley Lam MD Signature Date/Time: 04/27/2023/4:49:25 PM  Final         Scheduled Meds:  amoxicillin-clavulanate  1 tablet Oral Q12H   docusate sodium  100 mg Oral BID   doxycycline  100 mg Oral Q12H   furosemide  40 mg Intravenous BID   guaiFENesin  600 mg Oral BID   insulin aspart  0-15 Units Subcutaneous TID WC   insulin aspart  0-5 Units Subcutaneous QHS   insulin glargine-yfgn  10 Units Subcutaneous Daily   ipratropium-albuterol  3 mL Nebulization BID   multivitamin with minerals  1 tablet Oral Daily   polyethylene glycol  17 g Oral Daily   rivaroxaban  20 mg Oral Q supper   rosuvastatin  40 mg Oral Daily   senna  1 tablet Oral BID   sodium chloride flush  3 mL Intravenous Q12H   thiamine  100 mg Oral Daily   Continuous Infusions:  sodium chloride            Glade Lloyd, MD [facility] 04/29/2023, 8:04 AM

## 2023-04-30 DIAGNOSIS — Z89431 Acquired absence of right foot: Secondary | ICD-10-CM | POA: Diagnosis not present

## 2023-04-30 DIAGNOSIS — I482 Chronic atrial fibrillation, unspecified: Secondary | ICD-10-CM

## 2023-04-30 DIAGNOSIS — R413 Other amnesia: Secondary | ICD-10-CM | POA: Diagnosis not present

## 2023-04-30 DIAGNOSIS — Z7189 Other specified counseling: Secondary | ICD-10-CM | POA: Diagnosis not present

## 2023-04-30 DIAGNOSIS — I5031 Acute diastolic (congestive) heart failure: Secondary | ICD-10-CM | POA: Diagnosis not present

## 2023-04-30 DIAGNOSIS — J9601 Acute respiratory failure with hypoxia: Secondary | ICD-10-CM | POA: Diagnosis not present

## 2023-04-30 DIAGNOSIS — I050 Rheumatic mitral stenosis: Secondary | ICD-10-CM | POA: Diagnosis not present

## 2023-04-30 LAB — BASIC METABOLIC PANEL
Anion gap: 9 (ref 5–15)
BUN: 27 mg/dL — ABNORMAL HIGH (ref 8–23)
CO2: 35 mmol/L — ABNORMAL HIGH (ref 22–32)
Calcium: 8.4 mg/dL — ABNORMAL LOW (ref 8.9–10.3)
Chloride: 92 mmol/L — ABNORMAL LOW (ref 98–111)
Creatinine, Ser: 1.06 mg/dL (ref 0.61–1.24)
GFR, Estimated: >60 mL/min (ref >60)
Glucose, Bld: 190 mg/dL — ABNORMAL HIGH (ref 70–99)
Potassium: 3.8 mmol/L (ref 3.5–5.1)
Sodium: 136 mmol/L (ref 135–145)

## 2023-04-30 LAB — MAGNESIUM: Magnesium: 1.9 mg/dL (ref 1.7–2.4)

## 2023-04-30 LAB — GLUCOSE, CAPILLARY
Glucose-Capillary: 178 mg/dL — ABNORMAL HIGH (ref 70–99)
Glucose-Capillary: 212 mg/dL — ABNORMAL HIGH (ref 70–99)
Glucose-Capillary: 265 mg/dL — ABNORMAL HIGH (ref 70–99)
Glucose-Capillary: 304 mg/dL — ABNORMAL HIGH (ref 70–99)

## 2023-04-30 MED ORDER — FUROSEMIDE 40 MG PO TABS
40.0000 mg | ORAL_TABLET | Freq: Two times a day (BID) | ORAL | Status: DC
  Administered 2023-04-30 – 2023-05-10 (×21): 40 mg via ORAL
  Filled 2023-04-30 (×21): qty 1

## 2023-04-30 NOTE — Progress Notes (Signed)
Daily Progress Note   Patient Name: Jason [name]       Date: 04/30/2023 DOB: 1941/10/03  Age: 82 y.o. MRN#: [id number] Attending Physician: Glade Lloyd, MD Primary Care Physician: [name], MD Admit Date: 04/26/2023  Reason for Consultation/Follow-up: Establishing goals of care  Patient Profile/HPI:   82 y.o. male  with past medical history of dementia (not formally diagnosed- per spouse's report and occupational therapy, diabetes, hyperlipidemia, anemia, PAD, a flutter, mitral valve prolapse, mitral stenosis, rheumatic fever, foot abscess and amputation of R forefoot- admitted on 04/26/2023 with shortness of breath, admitted and diuresed for CHF.     Subjective: Chart reviewed including labs, progress notes, imaging from this and previous encounters.  Evaluated patient. Sitting up in bed, feeding himself. No complaints.  Spoke with spouse, Malachi Bonds and gave medical update. She has spoken with Child psychotherapist about patient going to [facility].   ROS   Physical Exam Vitals and nursing note reviewed.  Cardiovascular:     Rate and Rhythm: Normal rate.  Pulmonary:     Effort: Pulmonary effort is normal.  Skin:    General: Skin is warm and dry.  Psychiatric:     Comments: Cognitive impairment             Vital Signs: BP 138/80 (BP Location: Right Arm)   Pulse 76   Temp (!) 97.4 F (36.3 C) (Oral)   Resp 18   Ht 6\' 8"  (2.032 m)   Wt 132.9 kg   SpO2 99%   BMI 32.19 kg/m  SpO2: SpO2: 99 % O2 Device: O2 Device: Nasal Cannula O2 Flow Rate: O2 Flow Rate (L/min): 2 L/min  Intake/output summary:  Intake/Output Summary (Last 24 hours) at 04/30/2023 1139 Last data filed at 04/30/2023 1018 Gross per 24 hour  Intake 340 ml  Output 2950 ml  Net -2610 ml   LBM: Last BM Date :  04/25/23 Baseline Weight: Weight: 133.2 kg Most recent weight: Weight: 132.9 kg       Palliative Assessment/Data: PPS: 40%      Patient Active Problem List   Diagnosis Date Noted  . Acute diastolic CHF (congestive heart failure) (HCC) 04/26/2023  . Acute respiratory failure with hypoxia (HCC) 04/26/2023  . Status post amputation of right foot (HCC) 04/26/2023  . COPD (chronic obstructive pulmonary disease) (  HCC) 04/26/2023  . Normocytic anemia 04/26/2023  . Hyponatremia 04/26/2023  . Elevated INR 04/26/2023  . Subacute osteomyelitis of right foot (HCC) 03/30/2023  . Diabetic ulcer of right midfoot associated with type 2 diabetes mellitus, with muscle involvement without evidence of necrosis (HCC) 03/29/2023  . Gangrene of right foot (HCC) 03/29/2023  . Right foot infection 03/29/2023  . Encounter for general adult medical examination with abnormal findings 03/19/2022  . Insulin-requiring or dependent type II diabetes mellitus (HCC) 03/16/2022  . Atrial flutter (HCC) 08/02/2021  . Nonrheumatic mitral valve stenosis 01/27/2021  . Atherosclerosis of aorta (HCC) 09/30/2020  . Abnormal electrocardiogram (ECG) (EKG) 09/30/2020  . Thiamine deficiency 07/04/2020  . Ulcer of right foot with necrosis of bone (HCC)   . Malignant neoplasm of urinary bladder (HCC) 07/11/2018  . Chronic idiopathic constipation 06/12/2018  . Complex sleep apnea syndrome 01/12/2016  . OSA (obstructive sleep apnea) 12/18/2015  . Benign prostatic hyperplasia 02/18/2014  . PAD (peripheral artery disease) (HCC) 02/18/2014  . DEGENERATIVE JOINT DISEASE 02/20/2008  . Diabetes mellitus type 2 with atherosclerosis of arteries of extremities (HCC) 01/26/2008  . Hyperlipidemia 01/26/2008  . Mitral valve disorder 01/26/2008  . GERD 01/26/2008  . Irritable bowel syndrome 01/26/2008    Palliative Care Assessment & Plan    Assessment/Recommendations/Plan  Continue current plan Hopeful for d/c to SNF   Code  Status: DNR  Prognosis:  Unable to determine  Discharge Planning: To Be Determined  Care plan was discussed with patient's spouse and care team.   Thank you for allowing the Palliative Medicine Team to assist in the care of this patient.  Total time:  45 minutes  Greater than 50%  of this time was spent counseling and coordinating care related to the above assessment and plan.  Ocie Bob, AGNP-C Palliative Medicine   Please contact Palliative Medicine Team phone at [phone] for questions and concerns.

## 2023-04-30 NOTE — Progress Notes (Signed)
Progress Note  Patient Name: Jason [name] Date of Encounter: 04/30/2023  Primary Cardiologist:   [name], MD   Subjective   No heart complaints Has not been OOB or weight bearin   Inpatient Medications    Scheduled Meds:  amoxicillin-clavulanate  1 tablet Oral Q12H   docusate sodium  100 mg Oral BID   doxycycline  100 mg Oral Q12H   furosemide  40 mg Intravenous BID   guaiFENesin  600 mg Oral BID   insulin aspart  0-15 Units Subcutaneous TID WC   insulin aspart  0-5 Units Subcutaneous QHS   insulin glargine-yfgn  10 Units Subcutaneous Daily   ipratropium-albuterol  3 mL Nebulization BID   multivitamin with minerals  1 tablet Oral Daily   polyethylene glycol  17 g Oral Daily   rivaroxaban  20 mg Oral Q supper   rosuvastatin  40 mg Oral Daily   senna  1 tablet Oral BID   sodium chloride flush  3 mL Intravenous Q12H   thiamine  100 mg Oral Daily   Continuous Infusions:  sodium chloride     PRN Meds: sodium chloride, acetaminophen **OR** acetaminophen, albuterol, bisacodyl, HYDROcodone-acetaminophen, ondansetron **OR** ondansetron (ZOFRAN) IV, mouth rinse, polyethylene glycol, sodium chloride flush   Vital Signs    Vitals:   04/30/23 0455 04/30/23 0633 04/30/23 0739 04/30/23 0800  BP: 128/67     Pulse: 77     Resp: 17  (!) 24 14  Temp: 98 F (36.7 C)     TempSrc: Oral     SpO2: 93%  95%   Weight:  132.9 kg    Height:        Intake/Output Summary (Last 24 hours) at 04/30/2023 0806 Last data filed at 04/29/2023 2112 Gross per 24 hour  Intake 560 ml  Output 2500 ml  Net -1940 ml   Filed Weights   04/28/23 0500 04/29/23 0425 04/30/23 0633  Weight: 134.4 kg 131.5 kg 132.9 kg    Telemetry    Atrial flutter with variable conduction.  - Personally Reviewed  ECG    NA - Personally Reviewed  Physical Exam   Elderly black male No OS due to afib Lungs clear Plus 2 edema bilateral  Post amputation right forefoot   Labs     Chemistry Recent Labs  Lab 04/26/23 1440 04/27/23 0833 04/28/23 0452 04/29/23 0420 04/30/23 0450  NA 131* 135 137 136 136  K 4.5 4.7 4.7 3.9 3.8  CL 95* 95* 94* 91* 92*  CO2 29 30 33* 37* 35*  GLUCOSE 74 204* 91 105* 190*  BUN 14 15 24* 22 27*  CREATININE 0.83 0.92 1.19 0.96 1.06  CALCIUM 8.6* 8.7* 8.7* 8.6* 8.4*  PROT 7.4 8.0  --   --   --   ALBUMIN 3.4* 3.6  --   --   --   AST 24 21  --   --   --   ALT 22 24  --   --   --   ALKPHOS 42 46  --   --   --   BILITOT 0.8 1.1  --   --   --   GFRNONAA >60 >60 >60 >60 >60  ANIONGAP 7 10 10 8 9      Hematology Recent Labs  Lab 04/26/23 1440 04/26/23 2125 04/27/23 0833 04/28/23 0452  WBC 6.9  --  7.7 7.6  RBC 2.98* 3.13* 3.23* 3.00*  HGB 9.4*  --  10.3* 9.5*  HCT 31.2*  --  33.7* 31.5*  MCV 104.7*  --  104.3* 105.0*  MCH 31.5  --  31.9 31.7  MCHC 30.1  --  30.6 30.2  RDW 14.6  --  14.6 14.6  PLT 264  --  280 271    Cardiac EnzymesNo results for input(s): "TROPONINI" in the last 168 hours. No results for input(s): "TROPIPOC" in the last 168 hours.   BNP Recent Labs  Lab 04/26/23 1441  BNP 364.1*     DDimer  Recent Labs  Lab 04/26/23 1440  DDIMER 1.53*     Radiology    No results found.  Cardiac Studies    Echo:    1. Left ventricular ejection fraction, by estimation, is 60 to 65%. The  left ventricle has normal function. The left ventricle has no regional  wall motion abnormalities. There is severe asymmetric left ventricular  hypertrophy of the septal segment. Left   ventricular diastolic parameters are indeterminate. There is the  interventricular septum is flattened in diastole ('D' shaped left  ventricle), consistent with right ventricular volume overload.   2. Right ventricular systolic function is normal. The right ventricular  size is not well visualized. There is mildly elevated pulmonary artery  systolic pressure. The estimated right ventricular systolic pressure is  43.3 mmHg.   3. Left  atrial size was moderately dilated.   4. MVA by continuity equation < 1.5 cm2; unable to direct planimeter  valve. There is minimal forward movement, extensive calcification and  thickneing with relative sparing of the subvalvular apparatus. Wilkins  score 14; not amenable to balloon  intervention . The mitral valve is rheumatic. Mild mitral valve  regurgitation. Severe mitral stenosis. The mean mitral valve gradient is  22.0 mmHg with average heart rate of 77 bpm.   5. The aortic valve was not well visualized. Aortic valve regurgitation  is not visualized. No aortic stenosis is present.   6. The inferior vena cava is dilated in size with <50% respiratory  variability, suggesting right atrial pressure of 15 mmHg.    Patient Profile     82 y.o. male with a hx of mitral valve stenosis, mitral valve prolapse and MR, atrial flutter, HLD, type 2DM who is being seen 04/27/2023 for the evaluation of CHF, mitral valve stenosis/regurgitation at the request of Dr. Rito Ehrlich.   Assessment & Plan    MS/MR:  severe MS not a candidate for surgery and he does not want it Primary goal is diuresis and beta blockade to maximize diastolic filling time He is currently not on beta blocker will see if HR;s spike when he is OOB and more mobile Change lasix to PO  Atrial flutter:   Rate is controlled and he is back on Xarelto.  No change in therapy.     For questions or updates, please contact [facility] Please consult [email or web address] for contact info under Cardiology/STEMI.   Signed, Charlton Haws, MD  04/30/2023, 8:06 AM

## 2023-04-30 NOTE — Progress Notes (Signed)
BiPAP PRN-Not in [facility]  04/29/23 2200  BiPAP/CPAP/SIPAP  BiPAP/CPAP/SIPAP Pt Type Adult   m.

## 2023-04-30 NOTE — TOC Progression Note (Signed)
Transition of Care [facility]) - Progression Note    Patient Details  Name: Jason [name] MRN: [id number] Date of Birth: [birth year]  Transition of Care [facility]) CM/SW Contact  Otelia Santee, LCSW Phone Number: 04/30/2023, 12:25 PM  Clinical Narrative:    Requested insruance authorization through [employer] as pt's insurance is not managed under [employer]. Insurance auth request currently pending review.    Expected Discharge Plan: Skilled Nursing Facility Barriers to Discharge: Continued Medical Work up  Expected Discharge Plan and Services       Living arrangements for the past 2 months: Single Family Home                                       Social Determinants of Health (SDOH) Interventions SDOH Screenings   Food Insecurity: No Food Insecurity (04/26/2023)  Housing: Medium Risk (04/26/2023)  Transportation Needs: No Transportation Needs (04/26/2023)  Utilities: Not At Risk (04/26/2023)  Alcohol Screen: Low Risk  (06/01/2022)  Depression (PHQ2-9): Low Risk  (06/01/2022)  Financial Resource Strain: Low Risk  (06/01/2022)  Physical Activity: Sufficiently Active (06/01/2022)  Social Connections: Socially Integrated (06/01/2022)  Stress: Stress Concern Present (06/01/2022)  Tobacco Use: Medium Risk (04/26/2023)    Readmission Risk Interventions    04/30/2023   12:25 PM  Readmission Risk Prevention Plan  Transportation Screening Complete  PCP or Specialist Appt within 3-5 Days Complete  HRI or Home Care Consult Complete  Social Work Consult for Recovery Care Planning/Counseling Complete  Palliative Care Screening Complete  Medication Review Oceanographer) Complete

## 2023-04-30 NOTE — Progress Notes (Signed)
No need of bipap at this time. No resp distress noted. Pt on 2L [place] and doing well.

## 2023-04-30 NOTE — Progress Notes (Signed)
PROGRESS NOTE    Jason [name]  ACZ:[id number] DOB: 1941-01-08 DOA: 04/26/2023 PCP: [name], MD   Brief Narrative:  82 y.o. male with medical history significant of COPD,  DM2; status post right forefoot amputation recently and currently on oral antibiotics, severe mitral stenosis and regurgitation, history of atrial flutter on Xarelto, HLD, bladder cancer, sleep apnea presented with worsening shortness of breath.  He was noted to be hypoxic with saturations of 86% on room air.  He was subsequently admitted for acute diastolic CHF and started on IV Lasix.  Cardiology was consulted.  Assessment & Plan:   Acute respiratory failure with hypoxia Acute diastolic CHF Severe mitral stenosis; mild mitral regurgitation -strict input and output.  Daily weights.  Fluid restriction.  Cardiology following: IV Lasix has been switched to oral Lasix from today by cardiology.   -Negative balance of 9507.5 cc since admission -Currently still on 2 L oxygen by nasal cannula.  Wean off as able.  Paroxysmal atrial flutter -Currently rate controlled.  Continue Xarelto.  Status post transmetatarsal amputation right foot -Seen by podiatry recently.  There was some concern for infection.  Surgery was a few weeks ago.  Patient was started on Augmentin and doxycycline by podiatry.  Continue same.  Podiatry removed the staples on 04/29/2023 and recommended outpatient follow-up.  ESR was noted to be 50.  CRP less than 0.5.   Diabetes mellitus type 2 with hypoglycemia -Monitor CBGs.  Continue with SSI.  Increase long-acting insulin to 20 units daily.  Carb modified diet.  Hyperlipidemia -Continue with statin.   History of COPD -Respiratory status is stable. -No evidence for COPD exacerbation.   Normocytic anemia -Stable.   Hyponatremia -Improved.   Obesity -Outpatient follow-up  Goals of care -Overall prognosis is guarded to poor.  Palliative care following.  CODE STATUS has been changed to  DNR.  Physical deconditioning -PT recommended SNF placement.  TOC following.  Dementia -Following delirium precautions.   DVT prophylaxis: Xarelto Code Status: Full Family Communication: None at bedside Disposition Plan: Status is: Inpatient Remains inpatient appropriate because: Of severity of illness.  Need for SNF placement.  Consultants: Cardiology.  palliative care  Procedures: None  Antimicrobials: Augmentin and doxycycline   Subjective: Patient seen and examined at bedside.  Poor historian; confused.  No agitation, seizures, vomiting or fever reported.   Objective: Vitals:   04/30/23 0455 04/30/23 0633 04/30/23 0739 04/30/23 0800  BP: 128/67     Pulse: 77     Resp: 17  (!) 24 14  Temp: 98 F (36.7 C)     TempSrc: Oral     SpO2: 93%  95%   Weight:  132.9 kg    Height:        Intake/Output Summary (Last 24 hours) at 04/30/2023 0829 Last data filed at 04/29/2023 2112 Gross per 24 hour  Intake 560 ml  Output 2500 ml  Net -1940 ml    Filed Weights   04/28/23 0500 04/29/23 0425 04/30/23 1093  Weight: 134.4 kg 131.5 kg 132.9 kg    Examination:  General: No acute distress.  Still on 2 L oxygen by nasal cannula.  Looks chronically ill and deconditioned. ENT/neck: JVD is currently not elevated.  No palpable neck masses  respiratory: Bilateral decreased breath sounds at bases with bibasilar crackles and intermittent tachypnea  CVS: Rate mostly controlled; S1 and S2 are heard  abdominal: Soft, nontender, distended mildly; no organomegaly, bowel sounds are heard normally Extremities: No clubbing; bilateral lower  extremity edema present right foot has a dressing. CNS: Alert; Slightly confused.  Slow to respond.  Poor historian.  No focal neurologic deficit.  Able to move extremities Lymph: No palpable lymphadenopathy Skin: No obvious petechiae/rashes psych: Currently not agitated.  Very flat affect  Data Reviewed: I have personally reviewed following labs and  imaging studies  CBC: Recent Labs  Lab 04/26/23 1440 04/27/23 0833 04/28/23 0452  WBC 6.9 7.7 7.6  HGB 9.4* 10.3* 9.5*  HCT 31.2* 33.7* 31.5*  MCV 104.7* 104.3* 105.0*  PLT 264 280 271    Basic Metabolic Panel: Recent Labs  Lab 04/26/23 1440 04/26/23 2000 04/27/23 0833 04/28/23 0452 04/29/23 0420 04/30/23 0450  NA 131*  --  135 137 136 136  K 4.5  --  4.7 4.7 3.9 3.8  CL 95*  --  95* 94* 91* 92*  CO2 29  --  30 33* 37* 35*  GLUCOSE 74  --  204* 91 105* 190*  BUN 14  --  15 24* 22 27*  CREATININE 0.83  --  0.92 1.19 0.96 1.06  CALCIUM 8.6*  --  8.7* 8.7* 8.6* 8.4*  MG  --  1.8 2.2  --  1.9 1.9  PHOS  --  4.4 5.3*  --   --   --     GFR: Estimated Creatinine Clearance: 85.7 mL/min (by C-G formula based on SCr of 1.06 mg/dL). Liver Function Tests: Recent Labs  Lab 04/26/23 1440 04/27/23 0833  AST 24 21  ALT 22 24  ALKPHOS 42 46  BILITOT 0.8 1.1  PROT 7.4 8.0  ALBUMIN 3.4* 3.6    No results for input(s): "LIPASE", "AMYLASE" in the last 168 hours. No results for input(s): "AMMONIA" in the last 168 hours. Coagulation Profile: Recent Labs  Lab 04/26/23 1855  INR 2.3*    Cardiac Enzymes: Recent Labs  Lab 04/26/23 2000  CKTOTAL 87    BNP (last 3 results) No results for input(s): "PROBNP" in the last 8760 hours. HbA1C: No results for input(s): "HGBA1C" in the last 72 hours.  CBG: Recent Labs  Lab 04/29/23 0730 04/29/23 1140 04/29/23 1632 04/29/23 2039 04/29/23 2214  GLUCAP 85 162* 147* 244* 241*    Lipid Profile: No results for input(s): "CHOL", "HDL", "LDLCALC", "TRIG", "CHOLHDL", "LDLDIRECT" in the last 72 hours. Thyroid Function Tests: No results for input(s): "TSH", "T4TOTAL", "FREET4", "T3FREE", "THYROIDAB" in the last 72 hours.  Anemia Panel: No results for input(s): "VITAMINB12", "FOLATE", "FERRITIN", "TIBC", "IRON", "RETICCTPCT" in the last 72 hours.  Sepsis Labs: Recent Labs  Lab 04/26/23 2000  PROCALCITON <0.10      Recent Results (from the past 240 hour(s))  Resp panel by RT-PCR (RSV, Flu A&B, Covid) Anterior Nasal Swab     Status: None   Collection Time: 04/26/23  2:40 PM   Specimen: Anterior Nasal Swab  Result Value Ref Range Status   SARS Coronavirus 2 by RT PCR NEGATIVE NEGATIVE Final    Comment: (NOTE) SARS-CoV-2 target nucleic acids are NOT DETECTED.  The SARS-CoV-2 RNA is generally detectable in upper respiratory specimens during the acute phase of infection. The lowest concentration of SARS-CoV-2 viral copies this assay can detect is 138 copies/mL. A negative result does not preclude SARS-Cov-2 infection and should not be used as the sole basis for treatment or other patient management decisions. A negative result may occur with  improper specimen collection/handling, submission of specimen other than nasopharyngeal swab, presence of viral mutation(s) within the areas targeted by this  assay, and inadequate number of viral copies(<138 copies/mL). A negative result must be combined with clinical observations, patient history, and epidemiological information. The expected result is Negative.  Fact Sheet for Patients:  BloggerCourse.com  Fact Sheet for Healthcare Providers:  SeriousBroker.it  This test is no t yet approved or cleared by the [place] FDA and  has been authorized for detection and/or diagnosis of SARS-CoV-2 by FDA under an Emergency Use Authorization (EUA). This EUA will remain  in effect (meaning this test can be used) for the duration of the COVID-19 declaration under Section 564(b)(1) of the Act, 21 U.S.C.section 360bbb-3(b)(1), unless the authorization is terminated  or revoked sooner.       Influenza A by PCR NEGATIVE NEGATIVE Final   Influenza B by PCR NEGATIVE NEGATIVE Final    Comment: (NOTE) The Xpert Xpress SARS-CoV-2/FLU/RSV plus assay is intended as an aid in the diagnosis of influenza from  Nasopharyngeal swab specimens and should not be used as a sole basis for treatment. Nasal washings and aspirates are unacceptable for Xpert Xpress SARS-CoV-2/FLU/RSV testing.  Fact Sheet for Patients: BloggerCourse.com  Fact Sheet for Healthcare Providers: SeriousBroker.it  This test is not yet approved or cleared by the [place] FDA and has been authorized for detection and/or diagnosis of SARS-CoV-2 by FDA under an Emergency Use Authorization (EUA). This EUA will remain in effect (meaning this test can be used) for the duration of the COVID-19 declaration under Section 564(b)(1) of the Act, 21 U.S.C. section 360bbb-3(b)(1), unless the authorization is terminated or revoked.     Resp Syncytial Virus by PCR NEGATIVE NEGATIVE Final    Comment: (NOTE) Fact Sheet for Patients: BloggerCourse.com  Fact Sheet for Healthcare Providers: SeriousBroker.it  This test is not yet approved or cleared by the [place] FDA and has been authorized for detection and/or diagnosis of SARS-CoV-2 by FDA under an Emergency Use Authorization (EUA). This EUA will remain in effect (meaning this test can be used) for the duration of the COVID-19 declaration under Section 564(b)(1) of the Act, 21 U.S.C. section 360bbb-3(b)(1), unless the authorization is terminated or revoked.  Performed at [facility], [address], [place], [place] [address]   MRSA Next Gen by PCR, Nasal     Status: None   Collection Time: 04/27/23 12:08 AM   Specimen: Nasal Mucosa; Nasal Swab  Result Value Ref Range Status   MRSA by PCR Next Gen NOT DETECTED NOT DETECTED Final    Comment: (NOTE) The GeneXpert MRSA Assay (FDA approved for NASAL specimens only), is one component of a comprehensive MRSA colonization surveillance program. It is not intended to diagnose MRSA infection nor to guide or  monitor treatment for MRSA infections. Test performance is not FDA approved in patients less than 21 years old. Performed at [facility], [address] [place], [place] [address]          Radiology Studies: No results found.      Scheduled Meds:  amoxicillin-clavulanate  1 tablet Oral Q12H   docusate sodium  100 mg Oral BID   doxycycline  100 mg Oral Q12H   furosemide  40 mg Oral BID   guaiFENesin  600 mg Oral BID   insulin aspart  0-15 Units Subcutaneous TID WC   insulin aspart  0-5 Units Subcutaneous QHS   insulin glargine-yfgn  10 Units Subcutaneous Daily   ipratropium-albuterol  3 mL Nebulization BID   multivitamin with minerals  1 tablet Oral Daily  polyethylene glycol  17 g Oral Daily   rivaroxaban  20 mg Oral Q supper   rosuvastatin  40 mg Oral Daily   senna  1 tablet Oral BID   sodium chloride flush  3 mL Intravenous Q12H   thiamine  100 mg Oral Daily   Continuous Infusions:  sodium chloride            Glade Lloyd, MD [facility] 04/30/2023, 8:29 AM

## 2023-05-01 DIAGNOSIS — I483 Typical atrial flutter: Secondary | ICD-10-CM

## 2023-05-01 DIAGNOSIS — I050 Rheumatic mitral stenosis: Secondary | ICD-10-CM | POA: Diagnosis not present

## 2023-05-01 DIAGNOSIS — I5031 Acute diastolic (congestive) heart failure: Secondary | ICD-10-CM | POA: Diagnosis not present

## 2023-05-01 LAB — BASIC METABOLIC PANEL
Anion gap: 7 (ref 5–15)
BUN: 26 mg/dL — ABNORMAL HIGH (ref 8–23)
CO2: 36 mmol/L — ABNORMAL HIGH (ref 22–32)
Calcium: 8.6 mg/dL — ABNORMAL LOW (ref 8.9–10.3)
Chloride: 92 mmol/L — ABNORMAL LOW (ref 98–111)
Creatinine, Ser: 0.75 mg/dL (ref 0.61–1.24)
GFR, Estimated: >60 mL/min (ref >60)
Glucose, Bld: 286 mg/dL — ABNORMAL HIGH (ref 70–99)
Potassium: 4.4 mmol/L (ref 3.5–5.1)
Sodium: 135 mmol/L (ref 135–145)

## 2023-05-01 LAB — GLUCOSE, CAPILLARY
Glucose-Capillary: 223 mg/dL — ABNORMAL HIGH (ref 70–99)
Glucose-Capillary: 210 mg/dL — ABNORMAL HIGH (ref 70–99)
Glucose-Capillary: 279 mg/dL — ABNORMAL HIGH (ref 70–99)
Glucose-Capillary: 373 mg/dL — ABNORMAL HIGH (ref 70–99)

## 2023-05-01 LAB — MAGNESIUM: Magnesium: 2 mg/dL (ref 1.7–2.4)

## 2023-05-01 MED ORDER — INSULIN GLARGINE-YFGN 100 UNIT/ML ~~LOC~~ SOLN
25.0000 [IU] | Freq: Every day | SUBCUTANEOUS | Status: DC
  Administered 2023-05-01 – 2023-05-03 (×3): 25 [IU] via SUBCUTANEOUS
  Filled 2023-05-01 (×4): qty 0.25

## 2023-05-01 NOTE — TOC Progression Note (Signed)
Transition of Care [facility]) - Progression Note    Patient Details  Name: Jason [name] MRN: [id number] Date of Birth: 1940/12/16  [facility]) CM/SW Contact  Lavenia Atlas, RN Phone Number: 05/01/2023, 10:50 AM  Clinical Narrative:   Awaiting insurance auth. TOC will continue to follow.    Expected Discharge Plan: Skilled Nursing Facility Barriers to Discharge: Continued Medical Work up  Expected Discharge Plan and Services   Discharge Planning Services: CM Consult   Living arrangements for the past 2 months: Single Family Home                 DME Arranged: N/A DME Agency: NA         HH Agency: NA         Social Determinants of Health (SDOH) Interventions SDOH Screenings   Food Insecurity: No Food Insecurity (04/26/2023)  Housing: Medium Risk (04/26/2023)  Transportation Needs: No Transportation Needs (04/26/2023)  Utilities: Not At Risk (04/26/2023)  Alcohol Screen: Low Risk  (06/01/2022)  Depression (PHQ2-9): Low Risk  (06/01/2022)  Financial Resource Strain: Low Risk  (06/01/2022)  Physical Activity: Sufficiently Active (06/01/2022)  Social Connections: Socially Integrated (06/01/2022)  Stress: Stress Concern Present (06/01/2022)  Tobacco Use: Medium Risk (04/26/2023)    Readmission Risk Interventions    04/30/2023   12:25 PM  Readmission Risk Prevention Plan  Transportation Screening Complete  PCP or Specialist Appt within 3-5 Days Complete  HRI or Home Care Consult Complete  Social Work Consult for Recovery Care Planning/Counseling Complete  Palliative Care Screening Complete  Medication Review Oceanographer) Complete

## 2023-05-01 NOTE — Progress Notes (Signed)
PROGRESS NOTE    Jason [name]  ZOX:[id number] DOB: 07-May-1941 DOA: 04/26/2023 PCP: [name], MD   Brief Narrative:  82 y.o. male with medical history significant of COPD,  DM2; status post right forefoot amputation recently and currently on oral antibiotics, severe mitral stenosis and regurgitation, history of atrial flutter on Xarelto, HLD, bladder cancer, sleep apnea presented with worsening shortness of breath.  He was noted to be hypoxic with saturations of 86% on room air.  He was subsequently admitted for acute diastolic CHF and started on IV Lasix.  Cardiology was consulted.  Subsequently, IV Lasix has been switched to oral Lasix.  Palliative care has also evaluated the patient and he has been made DNR.  PT recommended SNF placement.  He is currently medically stable for SNF discharge.  Assessment & Plan:   Acute respiratory failure with hypoxia Acute diastolic CHF Severe mitral stenosis; mild mitral regurgitation -strict input and output.  Daily weights.  Fluid restriction.  Cardiology following: IV Lasix has been switched to oral Lasix from 04/30/2023 by cardiology.   -Negative balance of 11,607.5 cc since admission -Still on 2 L oxygen by nasal cannula.  Wean off as able.  Paroxysmal atrial flutter -Currently rate controlled.  Continue Xarelto.  Status post transmetatarsal amputation right foot -Seen by podiatry recently.  There was some concern for infection.  Surgery was a few weeks ago.  Patient was started on Augmentin and doxycycline by podiatry.  Continue same.  Podiatry removed the staples on 04/29/2023 and recommended outpatient follow-up.  ESR was noted to be 50.  CRP less than 0.5.   Diabetes mellitus type 2 with hypoglycemia -Monitor CBGs.  Continue with SSI.  Increase long-acting insulin to 25 units daily.  Carb modified diet.  Hyperlipidemia -Continue with statin.   History of COPD -Respiratory status is stable. -No evidence for COPD exacerbation.    Normocytic anemia -Stable.   Hyponatremia -Improved.   Obesity -Outpatient follow-up  Goals of care -Overall prognosis is guarded to poor.  Palliative care following.  CODE STATUS has been changed to DNR.  Physical deconditioning -PT recommended SNF placement.  TOC following.  Dementia -Follow delirium precautions.   DVT prophylaxis: Xarelto Code Status: DNR Family Communication: None at bedside Disposition Plan: Status is: Inpatient Remains inpatient appropriate because: Of severity of illness.  Need for SNF placement.  Currently medically stable for discharge to SNF  Consultants: Cardiology.  palliative care  Procedures: None  Antimicrobials: Augmentin and doxycycline   Subjective: Patient seen and examined at bedside.  Poor historian; still confused.  No seizures, vomiting or agitation reported.   Objective: Vitals:   04/30/23 2024 04/30/23 2025 05/01/23 0507 05/01/23 0558  BP: 130/62  118/67   Pulse: 89  78   Resp: 17  18   Temp: 98.2 F (36.8 C)  98.1 F (36.7 C)   TempSrc: Oral  Oral   SpO2: 94% 96% 95%   Weight:    133 kg  Height:        Intake/Output Summary (Last 24 hours) at 05/01/2023 0825 Last data filed at 05/01/2023 0649 Gross per 24 hour  Intake 1600 ml  Output 3100 ml  Net -1500 ml    Filed Weights   04/29/23 0425 04/30/23 0633 05/01/23 0558  Weight: 131.5 kg 132.9 kg 133 kg    Examination:  General: On 2 L oxygen via nasal cannula.  No distress.  Looks chronically ill and deconditioned.  Elderly male lying in bed. ENT/neck: No  obvious neck masses or JVD elevation noted  respiratory: Decreased breath sounds at bases bilaterally with basilar crackles  CVS: S1-S2 heard; rate currently controlled  abdominal: Soft, nontender, still slightly distended; no organomegaly, normal bowel sounds are heard  extremities: Lower extremity edema present bilaterally; no cyanosis.  Right foot has a dressing. CNS: Awake send and still confused.   Still slow to respond.  Poor historian.  No focal neurologic deficit.  Able to move extremities Lymph: No cervical lymphadenopathy Skin: No obvious ecchymosis/lesions  psych: Mostly flat affect.  Showing no signs of agitation   data Reviewed: I have personally reviewed following labs and imaging studies  CBC: Recent Labs  Lab 04/26/23 1440 04/27/23 0833 04/28/23 0452  WBC 6.9 7.7 7.6  HGB 9.4* 10.3* 9.5*  HCT 31.2* 33.7* 31.5*  MCV 104.7* 104.3* 105.0*  PLT 264 280 271    Basic Metabolic Panel: Recent Labs  Lab 04/26/23 2000 04/27/23 0833 04/28/23 0452 04/29/23 0420 04/30/23 0450 05/01/23 0442  NA  --  135 137 136 136 135  K  --  4.7 4.7 3.9 3.8 4.4  CL  --  95* 94* 91* 92* 92*  CO2  --  30 33* 37* 35* 36*  GLUCOSE  --  204* 91 105* 190* 286*  BUN  --  15 24* 22 27* 26*  CREATININE  --  0.92 1.19 0.96 1.06 0.75  CALCIUM  --  8.7* 8.7* 8.6* 8.4* 8.6*  MG 1.8 2.2  --  1.9 1.9 2.0  PHOS 4.4 5.3*  --   --   --   --     GFR: Estimated Creatinine Clearance: 113.5 mL/min (by C-G formula based on SCr of 0.75 mg/dL). Liver Function Tests: Recent Labs  Lab 04/26/23 1440 04/27/23 0833  AST 24 21  ALT 22 24  ALKPHOS 42 46  BILITOT 0.8 1.1  PROT 7.4 8.0  ALBUMIN 3.4* 3.6    No results for input(s): "LIPASE", "AMYLASE" in the last 168 hours. No results for input(s): "AMMONIA" in the last 168 hours. Coagulation Profile: Recent Labs  Lab 04/26/23 1855  INR 2.3*    Cardiac Enzymes: Recent Labs  Lab 04/26/23 2000  CKTOTAL 87    BNP (last 3 results) No results for input(s): "PROBNP" in the last 8760 hours. HbA1C: No results for input(s): "HGBA1C" in the last 72 hours.  CBG: Recent Labs  Lab 04/30/23 1012 04/30/23 1245 04/30/23 1630 04/30/23 2115 05/01/23 0737  GLUCAP 178* 212* 265* 304* 223*    Lipid Profile: No results for input(s): "CHOL", "HDL", "LDLCALC", "TRIG", "CHOLHDL", "LDLDIRECT" in the last 72 hours. Thyroid Function Tests: No results  for input(s): "TSH", "T4TOTAL", "FREET4", "T3FREE", "THYROIDAB" in the last 72 hours.  Anemia Panel: No results for input(s): "VITAMINB12", "FOLATE", "FERRITIN", "TIBC", "IRON", "RETICCTPCT" in the last 72 hours.  Sepsis Labs: Recent Labs  Lab 04/26/23 2000  PROCALCITON <0.10     Recent Results (from the past 240 hour(s))  Resp panel by RT-PCR (RSV, Flu A&B, Covid) Anterior Nasal Swab     Status: None   Collection Time: 04/26/23  2:40 PM   Specimen: Anterior Nasal Swab  Result Value Ref Range Status   SARS Coronavirus 2 by RT PCR NEGATIVE NEGATIVE Final    Comment: (NOTE) SARS-CoV-2 target nucleic acids are NOT DETECTED.  The SARS-CoV-2 RNA is generally detectable in upper respiratory specimens during the acute phase of infection. The lowest concentration of SARS-CoV-2 viral copies this assay can detect is 138 copies/mL.  A negative result does not preclude SARS-Cov-2 infection and should not be used as the sole basis for treatment or other patient management decisions. A negative result may occur with  improper specimen collection/handling, submission of specimen other than nasopharyngeal swab, presence of viral mutation(s) within the areas targeted by this assay, and inadequate number of viral copies(<138 copies/mL). A negative result must be combined with clinical observations, patient history, and epidemiological information. The expected result is Negative.  Fact Sheet for Patients:  BloggerCourse.com  Fact Sheet for Healthcare Providers:  SeriousBroker.it  This test is no t yet approved or cleared by the [place] FDA and  has been authorized for detection and/or diagnosis of SARS-CoV-2 by FDA under an Emergency Use Authorization (EUA). This EUA will remain  in effect (meaning this test can be used) for the duration of the COVID-19 declaration under Section 564(b)(1) of the Act, 21 U.S.C.section 360bbb-3(b)(1),  unless the authorization is terminated  or revoked sooner.       Influenza A by PCR NEGATIVE NEGATIVE Final   Influenza B by PCR NEGATIVE NEGATIVE Final    Comment: (NOTE) The Xpert Xpress SARS-CoV-2/FLU/RSV plus assay is intended as an aid in the diagnosis of influenza from Nasopharyngeal swab specimens and should not be used as a sole basis for treatment. Nasal washings and aspirates are unacceptable for Xpert Xpress SARS-CoV-2/FLU/RSV testing.  Fact Sheet for Patients: BloggerCourse.com  Fact Sheet for Healthcare Providers: SeriousBroker.it  This test is not yet approved or cleared by the [place] FDA and has been authorized for detection and/or diagnosis of SARS-CoV-2 by FDA under an Emergency Use Authorization (EUA). This EUA will remain in effect (meaning this test can be used) for the duration of the COVID-19 declaration under Section 564(b)(1) of the Act, 21 U.S.C. section 360bbb-3(b)(1), unless the authorization is terminated or revoked.     Resp Syncytial Virus by PCR NEGATIVE NEGATIVE Final    Comment: (NOTE) Fact Sheet for Patients: BloggerCourse.com  Fact Sheet for Healthcare Providers: SeriousBroker.it  This test is not yet approved or cleared by the [place] FDA and has been authorized for detection and/or diagnosis of SARS-CoV-2 by FDA under an Emergency Use Authorization (EUA). This EUA will remain in effect (meaning this test can be used) for the duration of the COVID-19 declaration under Section 564(b)(1) of the Act, 21 U.S.C. section 360bbb-3(b)(1), unless the authorization is terminated or revoked.  Performed at [facility], [address], [place], [place] [address]   MRSA Next Gen by PCR, Nasal     Status: None   Collection Time: 04/27/23 12:08 AM   Specimen: Nasal Mucosa; Nasal Swab  Result Value Ref Range Status    MRSA by PCR Next Gen NOT DETECTED NOT DETECTED Final    Comment: (NOTE) The GeneXpert MRSA Assay (FDA approved for NASAL specimens only), is one component of a comprehensive MRSA colonization surveillance program. It is not intended to diagnose MRSA infection nor to guide or monitor treatment for MRSA infections. Test performance is not FDA approved in patients less than 56 years old. Performed at [facility], [address] [place], [place] [address]          Radiology Studies: No results found.      Scheduled Meds:  amoxicillin-clavulanate  1 tablet Oral Q12H   docusate sodium  100 mg Oral BID   doxycycline  100 mg Oral Q12H   furosemide  40 mg Oral BID   guaiFENesin  600  mg Oral BID   insulin aspart  0-15 Units Subcutaneous TID WC   insulin aspart  0-5 Units Subcutaneous QHS   insulin glargine-yfgn  10 Units Subcutaneous Daily   ipratropium-albuterol  3 mL Nebulization BID   multivitamin with minerals  1 tablet Oral Daily   polyethylene glycol  17 g Oral Daily   rivaroxaban  20 mg Oral Q supper   rosuvastatin  40 mg Oral Daily   senna  1 tablet Oral BID   sodium chloride flush  3 mL Intravenous Q12H   thiamine  100 mg Oral Daily   Continuous Infusions:  sodium chloride            Glade Lloyd, MD [facility] 05/01/2023, 8:25 AM

## 2023-05-01 NOTE — Progress Notes (Signed)
Progress Note  Patient Name: Jason [name] Date of Encounter: 05/01/2023  Primary Cardiologist:   [name], MD   Subjective   No heart complaints Has not been OOB or weight bearin   Inpatient Medications    Scheduled Meds:  amoxicillin-clavulanate  1 tablet Oral Q12H   docusate sodium  100 mg Oral BID   doxycycline  100 mg Oral Q12H   furosemide  40 mg Oral BID   guaiFENesin  600 mg Oral BID   insulin aspart  0-15 Units Subcutaneous TID WC   insulin aspart  0-5 Units Subcutaneous QHS   insulin glargine-yfgn  10 Units Subcutaneous Daily   ipratropium-albuterol  3 mL Nebulization BID   multivitamin with minerals  1 tablet Oral Daily   polyethylene glycol  17 g Oral Daily   rivaroxaban  20 mg Oral Q supper   rosuvastatin  40 mg Oral Daily   senna  1 tablet Oral BID   sodium chloride flush  3 mL Intravenous Q12H   thiamine  100 mg Oral Daily   Continuous Infusions:  sodium chloride     PRN Meds: sodium chloride, acetaminophen **OR** acetaminophen, albuterol, bisacodyl, HYDROcodone-acetaminophen, ondansetron **OR** ondansetron (ZOFRAN) IV, mouth rinse, polyethylene glycol, sodium chloride flush   Vital Signs    Vitals:   04/30/23 2024 04/30/23 2025 05/01/23 0507 05/01/23 0558  BP: 130/62  118/67   Pulse: 89  78   Resp: 17  18   Temp: 98.2 F (36.8 C)  98.1 F (36.7 C)   TempSrc: Oral  Oral   SpO2: 94% 96% 95%   Weight:    133 kg  Height:        Intake/Output Summary (Last 24 hours) at 05/01/2023 0749 Last data filed at 05/01/2023 0649 Gross per 24 hour  Intake 1600 ml  Output 3700 ml  Net -2100 ml   Filed Weights   04/29/23 0425 04/30/23 0633 05/01/23 0558  Weight: 131.5 kg 132.9 kg 133 kg    Telemetry    Atrial flutter with variable conduction.  - Personally Reviewed  ECG    NA - Personally Reviewed  Physical Exam   Elderly black male No OS due to afib Lungs clear Plus 2 edema bilateral  Post amputation right forefoot   Labs     Chemistry Recent Labs  Lab 04/26/23 1440 04/27/23 0833 04/28/23 0452 04/29/23 0420 04/30/23 0450 05/01/23 0442  NA 131* 135   < > 136 136 135  K 4.5 4.7   < > 3.9 3.8 4.4  CL 95* 95*   < > 91* 92* 92*  CO2 29 30   < > 37* 35* 36*  GLUCOSE 74 204*   < > 105* 190* 286*  BUN 14 15   < > 22 27* 26*  CREATININE 0.83 0.92   < > 0.96 1.06 0.75  CALCIUM 8.6* 8.7*   < > 8.6* 8.4* 8.6*  PROT 7.4 8.0  --   --   --   --   ALBUMIN 3.4* 3.6  --   --   --   --   AST 24 21  --   --   --   --   ALT 22 24  --   --   --   --   ALKPHOS 42 46  --   --   --   --   BILITOT 0.8 1.1  --   --   --   --  GFRNONAA >60 >60   < > >60 >60 >60  ANIONGAP 7 10   < > 8 9 7    < > = values in this interval not displayed.     Hematology Recent Labs  Lab 04/26/23 1440 04/26/23 2125 04/27/23 0833 04/28/23 0452  WBC 6.9  --  7.7 7.6  RBC 2.98* 3.13* 3.23* 3.00*  HGB 9.4*  --  10.3* 9.5*  HCT 31.2*  --  33.7* 31.5*  MCV 104.7*  --  104.3* 105.0*  MCH 31.5  --  31.9 31.7  MCHC 30.1  --  30.6 30.2  RDW 14.6  --  14.6 14.6  PLT 264  --  280 271    Cardiac EnzymesNo results for input(s): "TROPONINI" in the last 168 hours. No results for input(s): "TROPIPOC" in the last 168 hours.   BNP Recent Labs  Lab 04/26/23 1441  BNP 364.1*     DDimer  Recent Labs  Lab 04/26/23 1440  DDIMER 1.53*     Radiology    No results found.  Cardiac Studies    Echo:    1. Left ventricular ejection fraction, by estimation, is 60 to 65%. The  left ventricle has normal function. The left ventricle has no regional  wall motion abnormalities. There is severe asymmetric left ventricular  hypertrophy of the septal segment. Left   ventricular diastolic parameters are indeterminate. There is the  interventricular septum is flattened in diastole ('D' shaped left  ventricle), consistent with right ventricular volume overload.   2. Right ventricular systolic function is normal. The right ventricular  size is not  well visualized. There is mildly elevated pulmonary artery  systolic pressure. The estimated right ventricular systolic pressure is  43.3 mmHg.   3. Left atrial size was moderately dilated.   4. MVA by continuity equation < 1.5 cm2; unable to direct planimeter  valve. There is minimal forward movement, extensive calcification and  thickneing with relative sparing of the subvalvular apparatus. Wilkins  score 14; not amenable to balloon  intervention . The mitral valve is rheumatic. Mild mitral valve  regurgitation. Severe mitral stenosis. The mean mitral valve gradient is  22.0 mmHg with average heart rate of 77 bpm.   5. The aortic valve was not well visualized. Aortic valve regurgitation  is not visualized. No aortic stenosis is present.   6. The inferior vena cava is dilated in size with <50% respiratory  variability, suggesting right atrial pressure of 15 mmHg.    Patient Profile     82 y.o. male with a hx of mitral valve stenosis, mitral valve prolapse and MR, atrial flutter, HLD, type 2DM who is being seen 04/27/2023 for the evaluation of CHF, mitral valve stenosis/regurgitation at the request of Dr. Rito Ehrlich.   Assessment & Plan    MS/MR:  severe MS not a candidate for surgery and he does not want it Primary goal is diuresis and beta blockade to maximize diastolic filling time He is currently not on beta blocker will see if HR;s spike when he is OOB and more mobile Change lasix to PO  Atrial flutter:   Rate is controlled and he is back on Xarelto.  No change in therapy.     For questions or updates, please contact [facility] Please consult [email or web address] for contact info under Cardiology/STEMI.   Signed, Charlton Haws, MD  05/01/2023, 7:49 AM

## 2023-05-02 ENCOUNTER — Ambulatory Visit: Payer: Medicare HMO | Admitting: Cardiology

## 2023-05-02 DIAGNOSIS — I5031 Acute diastolic (congestive) heart failure: Secondary | ICD-10-CM | POA: Diagnosis not present

## 2023-05-02 LAB — GLUCOSE, CAPILLARY
Glucose-Capillary: 186 mg/dL — ABNORMAL HIGH (ref 70–99)
Glucose-Capillary: 306 mg/dL — ABNORMAL HIGH (ref 70–99)
Glucose-Capillary: 153 mg/dL — ABNORMAL HIGH (ref 70–99)
Glucose-Capillary: 201 mg/dL — ABNORMAL HIGH (ref 70–99)

## 2023-05-02 LAB — BASIC METABOLIC PANEL
Anion gap: 8 (ref 5–15)
BUN: 29 mg/dL — ABNORMAL HIGH (ref 8–23)
CO2: 39 mmol/L — ABNORMAL HIGH (ref 22–32)
Calcium: 9.1 mg/dL (ref 8.9–10.3)
Chloride: 93 mmol/L — ABNORMAL LOW (ref 98–111)
Creatinine, Ser: 1.07 mg/dL (ref 0.61–1.24)
GFR, Estimated: >60 mL/min (ref >60)
Glucose, Bld: 181 mg/dL — ABNORMAL HIGH (ref 70–99)
Potassium: 4.8 mmol/L (ref 3.5–5.1)
Sodium: 140 mmol/L (ref 135–145)

## 2023-05-02 LAB — MAGNESIUM: Magnesium: 2.1 mg/dL (ref 1.7–2.4)

## 2023-05-02 MED ORDER — METOPROLOL SUCCINATE ER 25 MG PO TB24
12.5000 mg | ORAL_TABLET | Freq: Every day | ORAL | Status: DC
  Administered 2023-05-02 – 2023-05-10 (×9): 12.5 mg via ORAL
  Filled 2023-05-02 (×9): qty 1

## 2023-05-02 NOTE — Progress Notes (Signed)
Rounding Note    Patient Name: Jason [name] Date of Encounter: 05/02/2023  [facility] Cardiologist: [name], MD   Subjective   No complaints denies chest pain and SOB  Inpatient Medications    Scheduled Meds:  amoxicillin-clavulanate  1 tablet Oral Q12H   docusate sodium  100 mg Oral BID   doxycycline  100 mg Oral Q12H   furosemide  40 mg Oral BID   guaiFENesin  600 mg Oral BID   insulin aspart  0-15 Units Subcutaneous TID WC   insulin aspart  0-5 Units Subcutaneous QHS   insulin glargine-yfgn  25 Units Subcutaneous Daily   ipratropium-albuterol  3 mL Nebulization BID   multivitamin with minerals  1 tablet Oral Daily   polyethylene glycol  17 g Oral Daily   rivaroxaban  20 mg Oral Q supper   rosuvastatin  40 mg Oral Daily   senna  1 tablet Oral BID   sodium chloride flush  3 mL Intravenous Q12H   thiamine  100 mg Oral Daily   Continuous Infusions:  sodium chloride     PRN Meds: sodium chloride, acetaminophen **OR** acetaminophen, albuterol, bisacodyl, HYDROcodone-acetaminophen, ondansetron **OR** ondansetron (ZOFRAN) IV, mouth rinse, polyethylene glycol, sodium chloride flush   Vital Signs    Vitals:   05/01/23 2022 05/02/23 0444 05/02/23 0618 05/02/23 0753  BP: 122/61 117/63    Pulse: 85 77    Resp: 18 19    Temp: 98.4 F (36.9 C) 98.2 F (36.8 C)    TempSrc: Oral Oral    SpO2: 100% 100%  99%  Weight:   128.2 kg   Height:        Intake/Output Summary (Last 24 hours) at 05/02/2023 1042 Last data filed at 05/02/2023 0916 Gross per 24 hour  Intake 903 ml  Output 2990 ml  Net -2087 ml      05/02/2023    6:18 AM 05/01/2023    5:58 AM 04/30/2023    6:33 AM  Last 3 Weights  Weight (lbs) 282 lb 10.1 oz 293 lb 3.4 oz 292 lb 15.9 oz  Weight (kg) 128.2 kg 133 kg 132.9 kg      Telemetry    SR with long 1st degree AV block - Personally Reviewed  ECG    No new - Personally Reviewed  Physical Exam   GEN: No acute distress.    Neck: No JVD Cardiac: RRR, + murmurs, rubs, or gallops.  Respiratory: few scattered rales to auscultation bilaterally. GI: Soft, nontender, non-distended  MS: No edema; No deformity. Neuro:  Nonfocal  Psych: Normal affect   Labs    High Sensitivity Troponin:   Recent Labs  Lab 04/26/23 1440 04/26/23 1822 04/26/23 1855 04/26/23 2330  TROPONINIHS 31* 31* 29* 24*     Chemistry Recent Labs  Lab 04/26/23 1440 04/26/23 2000 04/27/23 0833 04/28/23 0452 04/30/23 0450 05/01/23 0442 05/02/23 0407  NA 131*  --  135   < > 136 135 140  K 4.5  --  4.7   < > 3.8 4.4 4.8  CL 95*  --  95*   < > 92* 92* 93*  CO2 29  --  30   < > 35* 36* 39*  GLUCOSE 74  --  204*   < > 190* 286* 181*  BUN 14  --  15   < > 27* 26* 29*  CREATININE 0.83  --  0.92   < > 1.06 0.75 1.07  CALCIUM 8.6*  --  8.7*   < > 8.4* 8.6* 9.1  MG  --    < > 2.2   < > 1.9 2.0 2.1  PROT 7.4  --  8.0  --   --   --   --   ALBUMIN 3.4*  --  3.6  --   --   --   --   AST 24  --  21  --   --   --   --   ALT 22  --  24  --   --   --   --   ALKPHOS 42  --  46  --   --   --   --   BILITOT 0.8  --  1.1  --   --   --   --   GFRNONAA >60  --  >60   < > >60 >60 >60  ANIONGAP 7  --  10   < > 9 7 8    < > = values in this interval not displayed.    Lipids No results for input(s): "CHOL", "TRIG", "HDL", "LABVLDL", "LDLCALC", "CHOLHDL" in the last 168 hours.  Hematology Recent Labs  Lab 04/26/23 1440 04/26/23 2125 04/27/23 0833 04/28/23 0452  WBC 6.9  --  7.7 7.6  RBC 2.98* 3.13* 3.23* 3.00*  HGB 9.4*  --  10.3* 9.5*  HCT 31.2*  --  33.7* 31.5*  MCV 104.7*  --  104.3* 105.0*  MCH 31.5  --  31.9 31.7  MCHC 30.1  --  30.6 30.2  RDW 14.6  --  14.6 14.6  PLT 264  --  280 271   Thyroid  Recent Labs  Lab 04/26/23 2125  TSH 0.771    BNP Recent Labs  Lab 04/26/23 1441  BNP 364.1*    DDimer  Recent Labs  Lab 04/26/23 1440  DDIMER 1.53*     Radiology    No results found.  Cardiac Studies   Echo:  04/27/23    1. Left ventricular ejection fraction, by estimation, is 60 to 65%. The  left ventricle has normal function. The left ventricle has no regional  wall motion abnormalities. There is severe asymmetric left ventricular  hypertrophy of the septal segment. Left   ventricular diastolic parameters are indeterminate. There is the  interventricular septum is flattened in diastole ('D' shaped left  ventricle), consistent with right ventricular volume overload.   2. Right ventricular systolic function is normal. The right ventricular  size is not well visualized. There is mildly elevated pulmonary artery  systolic pressure. The estimated right ventricular systolic pressure is  43.3 mmHg.   3. Left atrial size was moderately dilated.   4. MVA by continuity equation < 1.5 cm2; unable to direct planimeter  valve. There is minimal forward movement, extensive calcification and  thickneing with relative sparing of the subvalvular apparatus. Wilkins  score 14; not amenable to balloon  intervention . The mitral valve is rheumatic. Mild mitral valve  regurgitation. Severe mitral stenosis. The mean mitral valve gradient is  22.0 mmHg with average heart rate of 77 bpm.   5. The aortic valve was not well visualized. Aortic valve regurgitation  is not visualized. No aortic stenosis is present.   6. The inferior vena cava is dilated in size with <50% respiratory  variability, suggesting right atrial pressure of 15 mmHg.     Patient Profile     82 y.o. male hx of mitral valve stenosis, mitral valve prolapse and MR, atrial  flutter, HLD, type 2DM who is being seen for the evaluation of CHF, mitral valve stenosis/regurgitation   Assessment & Plan    CHF/MS/MR  --severe MS not a candidate for surgery and he does not want.   --goal is diuresis and Beta blockade to maximize diastolic filling time.  --neg 13,454 since admit and wt down from pk of 134.4 kg to 128.2 kg loss of 13.6 lbs --now on po lasix 40 BID   (was not on lasix prior to admit)  Atrial flutter now SR with long 1st degree AV block  --back on xarelto       For questions or updates, please contact [facility] Please consult [email or web address] for contact info under        Signed, Nada Boozer, NP  05/02/2023, 10:42 AM

## 2023-05-02 NOTE — Inpatient Diabetes Management (Signed)
Inpatient Diabetes Program Recommendations  AACE/ADA: New Consensus Statement on Inpatient Glycemic Control (2015)  Target Ranges:  Prepandial:   less than 140 mg/dL      Peak postprandial:   less than 180 mg/dL (1-2 hours)      Critically ill patients:  140 - 180 mg/dL   Lab Results  Component Value Date   GLUCAP 306 (H) 05/02/2023   HGBA1C 5.8 (H) 04/26/2023    Review of Glycemic Control  Latest Reference Range & Units 05/02/23 07:25 05/02/23 11:20  Glucose-Capillary 70 - 99 mg/dL 161 (H) 096 (H)  Diabetes history: DM 2 Outpatient Diabetes medications: Toujeo 40 units Daily, novolog 8 units tid, Metformin 1000 mg bid Current orders for Inpatient glycemic control:  Semglee 25 units Daily Novolog 0-15 units tid with meals and HS  Inpatient Diabetes Program Recommendations:    May consider adding Novolog 3 units tid with meals (hold if patient eats less than 50% or NPO).   Thanks,  Beryl Meager, RN, BC-ADM Inpatient Diabetes Coordinator Pager [phone]  (8a-5p)

## 2023-05-02 NOTE — Plan of Care (Signed)

## 2023-05-02 NOTE — Care Management Important Message (Signed)
Important Message  Patient Details IM Letter given. Name: Jason [name] MRN: [id number] Date of Birth: Apr 18, 1941   Medicare Important Message Given:  Yes     Caren Macadam 05/02/2023, 12:13 PM

## 2023-05-02 NOTE — Progress Notes (Signed)
PROGRESS NOTE    Jason [name]  ZOX:[id number] DOB: Jan 28, 1941 DOA: 04/26/2023 PCP: [name], MD   Brief Narrative:  82 y.o. male with medical history significant of COPD,  DM2; status post right forefoot amputation recently and currently on oral antibiotics, severe mitral stenosis and regurgitation, history of atrial flutter on Xarelto, HLD, bladder cancer, sleep apnea presented with worsening shortness of breath.  He was noted to be hypoxic with saturations of 86% on room air.  He was subsequently admitted for acute diastolic CHF and started on IV Lasix.  Cardiology was consulted.  Subsequently, IV Lasix has been switched to oral Lasix.  Palliative care has also evaluated the patient and he has been made DNR.  PT recommended SNF placement.  He is currently medically stable for SNF discharge.  Assessment & Plan:   Acute respiratory failure with hypoxia Acute diastolic CHF Severe mitral stenosis; mild mitral regurgitation -strict input and output.  Daily weights.  Fluid restriction.  Cardiology following: IV Lasix has been switched to oral Lasix from 04/30/2023 by cardiology.   -Negative balance of 13,457.5 cc since admission -Still on 2 L oxygen by nasal cannula.  Wean off as able.  Paroxysmal atrial flutter -Currently rate controlled.  Continue Xarelto.  Status post transmetatarsal amputation right foot -Seen by podiatry recently.  There was some concern for infection.  Surgery was a few weeks ago.  Patient was started on Augmentin and doxycycline by podiatry.  Continue same.  Podiatry removed the staples on 04/29/2023 and recommended outpatient follow-up.  ESR was noted to be 50.  CRP less than 0.5.   Diabetes mellitus type 2 with hypoglycemia -Monitor CBGs.  Continue with SSI.  Continue long-acting insulin.  Carb modified diet.  Hyperlipidemia -Continue with statin.   History of COPD -Respiratory status is stable. -No evidence for COPD exacerbation.   Normocytic  anemia -Stable.   Hyponatremia -Improved.   Obesity -Outpatient follow-up  Goals of care -Overall prognosis is guarded to poor.  Palliative care following.  CODE STATUS has been changed to DNR.  Physical deconditioning -PT recommended SNF placement.  TOC following.  Dementia -Follow delirium precautions.   DVT prophylaxis: Xarelto Code Status: DNR Family Communication: None at bedside Disposition Plan: Status is: Inpatient Remains inpatient appropriate because: Of severity of illness.  Need for SNF placement.  Currently medically stable for discharge to SNF  Consultants: Cardiology.  palliative care  Procedures: None  Antimicrobials: Augmentin and doxycycline   Subjective: Patient seen and examined at bedside.  Poor historian; still confused.  No fever, vomiting, seizures reported.   Objective: Vitals:   05/01/23 2022 05/02/23 0444 05/02/23 0618 05/02/23 0753  BP: 122/61 117/63    Pulse: 85 77    Resp: 18 19    Temp: 98.4 F (36.9 C) 98.2 F (36.8 C)    TempSrc: Oral Oral    SpO2: 100% 100%  99%  Weight:   128.2 kg   Height:        Intake/Output Summary (Last 24 hours) at 05/02/2023 0806 Last data filed at 05/02/2023 0618 Gross per 24 hour  Intake 1140 ml  Output 2990 ml  Net -1850 ml    Filed Weights   04/30/23 0633 05/01/23 0558 05/02/23 0618  Weight: 132.9 kg 133 kg 128.2 kg    Examination:  General: On 2 L oxygen via nasal cannula.  No distress.  Chronically ill and deconditioned.  Confused, slow to respond.  Poor historian. respiratory: Decreased breath sounds at bases  bilaterally with some crackles CVS: Currently rate controlled; S1-S2 heard  abdominal: Soft, nontender, slightly distended, no organomegaly; bowel sounds normally heard  extremities: Trace lower extremity edema; no clubbing.  Right foot dressing present    data Reviewed: I have personally reviewed following labs and imaging studies  CBC: Recent Labs  Lab 04/26/23 1440  04/27/23 0833 04/28/23 0452  WBC 6.9 7.7 7.6  HGB 9.4* 10.3* 9.5*  HCT 31.2* 33.7* 31.5*  MCV 104.7* 104.3* 105.0*  PLT 264 280 271    Basic Metabolic Panel: Recent Labs  Lab 04/26/23 2000 04/27/23 0833 04/28/23 0452 04/29/23 0420 04/30/23 0450 05/01/23 0442 05/02/23 0407  NA  --  135 137 136 136 135 140  K  --  4.7 4.7 3.9 3.8 4.4 4.8  CL  --  95* 94* 91* 92* 92* 93*  CO2  --  30 33* 37* 35* 36* 39*  GLUCOSE  --  204* 91 105* 190* 286* 181*  BUN  --  15 24* 22 27* 26* 29*  CREATININE  --  0.92 1.19 0.96 1.06 0.75 1.07  CALCIUM  --  8.7* 8.7* 8.6* 8.4* 8.6* 9.1  MG 1.8 2.2  --  1.9 1.9 2.0 2.1  PHOS 4.4 5.3*  --   --   --   --   --     GFR: Estimated Creatinine Clearance: 83.4 mL/min (by C-G formula based on SCr of 1.07 mg/dL). Liver Function Tests: Recent Labs  Lab 04/26/23 1440 04/27/23 0833  AST 24 21  ALT 22 24  ALKPHOS 42 46  BILITOT 0.8 1.1  PROT 7.4 8.0  ALBUMIN 3.4* 3.6    No results for input(s): "LIPASE", "AMYLASE" in the last 168 hours. No results for input(s): "AMMONIA" in the last 168 hours. Coagulation Profile: Recent Labs  Lab 04/26/23 1855  INR 2.3*    Cardiac Enzymes: Recent Labs  Lab 04/26/23 2000  CKTOTAL 87    BNP (last 3 results) No results for input(s): "PROBNP" in the last 8760 hours. HbA1C: No results for input(s): "HGBA1C" in the last 72 hours.  CBG: Recent Labs  Lab 05/01/23 0737 05/01/23 1305 05/01/23 1646 05/01/23 2019 05/02/23 0725  GLUCAP 223* 210* 279* 373* 186*    Lipid Profile: No results for input(s): "CHOL", "HDL", "LDLCALC", "TRIG", "CHOLHDL", "LDLDIRECT" in the last 72 hours. Thyroid Function Tests: No results for input(s): "TSH", "T4TOTAL", "FREET4", "T3FREE", "THYROIDAB" in the last 72 hours.  Anemia Panel: No results for input(s): "VITAMINB12", "FOLATE", "FERRITIN", "TIBC", "IRON", "RETICCTPCT" in the last 72 hours.  Sepsis Labs: Recent Labs  Lab 04/26/23 2000  PROCALCITON <0.10      Recent Results (from the past 240 hour(s))  Resp panel by RT-PCR (RSV, Flu A&B, Covid) Anterior Nasal Swab     Status: None   Collection Time: 04/26/23  2:40 PM   Specimen: Anterior Nasal Swab  Result Value Ref Range Status   SARS Coronavirus 2 by RT PCR NEGATIVE NEGATIVE Final    Comment: (NOTE) SARS-CoV-2 target nucleic acids are NOT DETECTED.  The SARS-CoV-2 RNA is generally detectable in upper respiratory specimens during the acute phase of infection. The lowest concentration of SARS-CoV-2 viral copies this assay can detect is 138 copies/mL. A negative result does not preclude SARS-Cov-2 infection and should not be used as the sole basis for treatment or other patient management decisions. A negative result may occur with  improper specimen collection/handling, submission of specimen other than nasopharyngeal swab, presence of viral mutation(s) within the  areas targeted by this assay, and inadequate number of viral copies(<138 copies/mL). A negative result must be combined with clinical observations, patient history, and epidemiological information. The expected result is Negative.  Fact Sheet for Patients:  BloggerCourse.com  Fact Sheet for Healthcare Providers:  SeriousBroker.it  This test is no t yet approved or cleared by the [place] FDA and  has been authorized for detection and/or diagnosis of SARS-CoV-2 by FDA under an Emergency Use Authorization (EUA). This EUA will remain  in effect (meaning this test can be used) for the duration of the COVID-19 declaration under Section 564(b)(1) of the Act, 21 U.S.C.section 360bbb-3(b)(1), unless the authorization is terminated  or revoked sooner.       Influenza A by PCR NEGATIVE NEGATIVE Final   Influenza B by PCR NEGATIVE NEGATIVE Final    Comment: (NOTE) The Xpert Xpress SARS-CoV-2/FLU/RSV plus assay is intended as an aid in the diagnosis of influenza from  Nasopharyngeal swab specimens and should not be used as a sole basis for treatment. Nasal washings and aspirates are unacceptable for Xpert Xpress SARS-CoV-2/FLU/RSV testing.  Fact Sheet for Patients: BloggerCourse.com  Fact Sheet for Healthcare Providers: SeriousBroker.it  This test is not yet approved or cleared by the [place] FDA and has been authorized for detection and/or diagnosis of SARS-CoV-2 by FDA under an Emergency Use Authorization (EUA). This EUA will remain in effect (meaning this test can be used) for the duration of the COVID-19 declaration under Section 564(b)(1) of the Act, 21 U.S.C. section 360bbb-3(b)(1), unless the authorization is terminated or revoked.     Resp Syncytial Virus by PCR NEGATIVE NEGATIVE Final    Comment: (NOTE) Fact Sheet for Patients: BloggerCourse.com  Fact Sheet for Healthcare Providers: SeriousBroker.it  This test is not yet approved or cleared by the [place] FDA and has been authorized for detection and/or diagnosis of SARS-CoV-2 by FDA under an Emergency Use Authorization (EUA). This EUA will remain in effect (meaning this test can be used) for the duration of the COVID-19 declaration under Section 564(b)(1) of the Act, 21 U.S.C. section 360bbb-3(b)(1), unless the authorization is terminated or revoked.  Performed at [facility], [address], [place], [place] [address]   MRSA Next Gen by PCR, Nasal     Status: None   Collection Time: 04/27/23 12:08 AM   Specimen: Nasal Mucosa; Nasal Swab  Result Value Ref Range Status   MRSA by PCR Next Gen NOT DETECTED NOT DETECTED Final    Comment: (NOTE) The GeneXpert MRSA Assay (FDA approved for NASAL specimens only), is one component of a comprehensive MRSA colonization surveillance program. It is not intended to diagnose MRSA infection nor to guide or  monitor treatment for MRSA infections. Test performance is not FDA approved in patients less than 71 years old. Performed at [facility], [address], [place], [place] [address]          Radiology Studies: No results found.      Scheduled Meds:  amoxicillin-clavulanate  1 tablet Oral Q12H   docusate sodium  100 mg Oral BID   doxycycline  100 mg Oral Q12H   furosemide  40 mg Oral BID   guaiFENesin  600 mg Oral BID   insulin aspart  0-15 Units Subcutaneous TID WC   insulin aspart  0-5 Units Subcutaneous QHS   insulin glargine-yfgn  25 Units Subcutaneous Daily   ipratropium-albuterol  3 mL Nebulization BID   multivitamin with minerals  1 tablet  Oral Daily   polyethylene glycol  17 g Oral Daily   rivaroxaban  20 mg Oral Q supper   rosuvastatin  40 mg Oral Daily   senna  1 tablet Oral BID   sodium chloride flush  3 mL Intravenous Q12H   thiamine  100 mg Oral Daily   Continuous Infusions:  sodium chloride            Glade Lloyd, MD [facility] 05/02/2023, 8:06 AM

## 2023-05-02 NOTE — TOC Progression Note (Signed)
Transition of Care [facility]) - Progression Note    Patient Details  Name: Jason [name] MRN: [id number] Date of Birth: 1941-04-08  Transition of Care [facility]) CM/SW Contact  Larrie Kass, LCSW Phone Number: 05/02/2023, 9:42 AM  Clinical Narrative:    Pt's insurance auth still pending. TOC to follow.    Expected Discharge Plan: Skilled Nursing Facility Barriers to Discharge: Continued Medical Work up  Expected Discharge Plan and Services   Discharge Planning Services: CM Consult   Living arrangements for the past 2 months: Single Family Home                 DME Arranged: N/A DME Agency: NA         HH Agency: NA         Social Determinants of Health (SDOH) Interventions SDOH Screenings   Food Insecurity: No Food Insecurity (04/26/2023)  Housing: Medium Risk (04/26/2023)  Transportation Needs: No Transportation Needs (04/26/2023)  Utilities: Not At Risk (04/26/2023)  Alcohol Screen: Low Risk  (06/01/2022)  Depression (PHQ2-9): Low Risk  (06/01/2022)  Financial Resource Strain: Low Risk  (06/01/2022)  Physical Activity: Sufficiently Active (06/01/2022)  Social Connections: Socially Integrated (06/01/2022)  Stress: Stress Concern Present (06/01/2022)  Tobacco Use: Medium Risk (04/26/2023)    Readmission Risk Interventions    04/30/2023   12:25 PM  Readmission Risk Prevention Plan  Transportation Screening Complete  PCP or Specialist Appt within 3-5 Days Complete  HRI or Home Care Consult Complete  Social Work Consult for Recovery Care Planning/Counseling Complete  Palliative Care Screening Complete  Medication Review Oceanographer) Complete

## 2023-05-02 NOTE — Progress Notes (Signed)
Physical Therapy Treatment [name] MRN: [id number] DOB: [birth year] Today's Date: 05/02/2023   History of Present Illness 82 yo male admitted with acute on chronic CHF, acute respiratory failure. 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 Other hx: CHF, Dm, mital stenosis, Aflutter bladder Ca, peripheral insufficiency, OSA, memory loss, osteomyelitis    PT Comments    General Comments: dementia at baseline but pleasant and following all commands.  Able to share his home situation.  Pt aware he is "not suppose" to weight bear through R LE but "I do it any way", stated pt.  "I can't walk without putting some weight on it".  "hopping is for rabits" pt laughed. Pt required MAX encouragement to participate.  Strong willed.  Assisted OOB.  General bed mobility comments: Applied RIGHT Cam Boot.  pt self able requiring no physical Assist.  General transfer comment: assisted to rise from elevated bed with VC's for NWB R LE (noncompliant) as well as VC's safety with turns transfering to [facility].  Good use of hands to steady self but non compliant with NWBing.  General Gait Details: using a gait belt looped at lower walker side to simutale a "WALKER KNEE SLING" attempted to instruct/educate placing distal knee joint on sling and amb 4 feet to BSC.  Pt took steps then removed his R LE from sling back onto floor and proceeded to amb PWB to [facility].  "I can't do that", stated pt.  Pt on BSC extended time for attempted BM.  Proceeded to amb back to bed 4 feet with out using makeshift knee sling. Assisted back to bed, removed CAM Boot and positioned to comfort.   Rec a "walker Knee Sling" for NWB.  Pt will need ST Rehab at SNF to address mobility and functional decline prior to safely returning home.   Recommendations for follow up therapy are one component of a multi-disciplinary discharge planning process, led by the attending physician.   Recommendations may be updated based on patient status, additional functional criteria and insurance authorization.  Follow Up Recommendations       Assistance Recommended at Discharge Frequent or constant Supervision/Assistance  Patient can return home with the following Assist for transportation;Help with stairs or ramp for entrance;Assistance with cooking/housework;A little help with walking and/or transfers;A little help with bathing/dressing/bathroom   Equipment Recommendations  Walker Knee Sling   Recommendations for Other Services       Precautions / Restrictions Precautions Precautions: Fall Required Braces or Orthoses: Other Brace Other Brace: CAM boot in room Restrictions Weight Bearing Restrictions: Yes RLE Weight Bearing: Non weight bearing Other Position/Activity Restrictions: pt has been noncompliant with NWB status     Mobility  Bed Mobility Overal bed mobility: Needs Assistance Bed Mobility: Supine to Sit, Sit to Supine     Supine to sit: Supervision Sit to supine: Supervision   General bed mobility comments: Applied RIGHT Cam Boot.  pt self able requiring no physical Assist    Transfers Overall transfer level: Needs assistance Equipment used: Rolling walker (2 wheels) Transfers: Sit to/from Stand Sit to Stand: Min guard, Min assist           General transfer comment: assisted to rise from elevated bed with VC's for NWB R LE (noncompliant) as well as VC's safety with turns transfering to [facility].  Good use of hands to steady self but non compliant with NWBing.    Ambulation/Gait Ambulation/Gait assistance: Mod assist, Max Chemical engineer (  Feet): 8 Feet (4 feet x 2) Assistive device: Rolling walker (2 wheels) Gait Pattern/deviations: Step-to pattern, Decreased stance time - right Gait velocity: decreased     General Gait Details: using a gait belt looped at lower walker side to simutale a "WALKER KNEE SLING" attempted to instruct/educate  placing distal knee joint on sling and amb 4 feet to BSC.  Pt took steps then removed his R LE from sling back onto floor and proceeded to amb PWB to [facility].  "I can't do that", stated pt.  Pt on BSC extended time for attempted BM.  Proceeded to amb back to bed 4 feet with out using makeshift knee sling.   Stairs             Wheelchair Mobility    Modified Rankin (Stroke Patients Only)       Balance                                            Cognition Arousal/Alertness: Awake/alert Behavior During Therapy: WFL for tasks assessed/performed Overall Cognitive Status: History of cognitive impairments - at baseline                                 General Comments: dementia at baseline but pleasant and following all commands.  Able to share his home situation.  Pt aware he is "not suppose" to weight bear through R LE but "I do it any way", stated pt.  "I can't walk without putting some weight on it".  "hopping is for rabits" pt laughed.        Exercises      General Comments        Pertinent Vitals/Pain Pain Assessment Pain Assessment: 0-10 Pain Score: 4  Pain Descriptors / Indicators: Discomfort, Operative site guarding, Aching Pain Intervention(s): Monitored during session, Repositioned    Home Living                          Prior Function            PT Goals (current goals can now be found in the care plan section) Progress towards PT goals: Progressing toward goals    Frequency    Min 1X/week      PT Plan Current plan remains appropriate    Co-evaluation              AM-PAC PT "6 Clicks" Mobility   Outcome Measure  Help needed turning from your back to your side while in a flat bed without using bedrails?: None Help needed moving from lying on your back to sitting on the side of a flat bed without using bedrails?: None Help needed moving to and from a bed to a chair (including a wheelchair)?: A  Little Help needed standing up from a chair using your arms (e.g., wheelchair or bedside chair)?: A Lot Help needed to walk in hospital room?: Total Help needed climbing 3-5 steps with a railing? : Total 6 Click Score: 15    End of Session Equipment Utilized During Treatment: Gait belt Activity Tolerance: Patient tolerated treatment well Patient left: in bed;with call bell/phone within reach;with bed alarm set Nurse Communication: Mobility status PT Visit Diagnosis: Difficulty in walking, not elsewhere classified (R26.2)     Time: 0935-1000  PT Time Calculation (min) (ACUTE ONLY): 25 min  Charges:  $Gait Training: 8-22 mins $Therapeutic Activity: 8-22 mins                     Felecia Shelling  PTA Acute  Rehabilitation Services Office M-F          [phone]

## 2023-05-03 DIAGNOSIS — I050 Rheumatic mitral stenosis: Secondary | ICD-10-CM | POA: Diagnosis not present

## 2023-05-03 DIAGNOSIS — I480 Paroxysmal atrial fibrillation: Secondary | ICD-10-CM | POA: Diagnosis not present

## 2023-05-03 DIAGNOSIS — I5031 Acute diastolic (congestive) heart failure: Secondary | ICD-10-CM | POA: Diagnosis not present

## 2023-05-03 LAB — BASIC METABOLIC PANEL
Anion gap: 8 (ref 5–15)
BUN: 26 mg/dL — ABNORMAL HIGH (ref 8–23)
CO2: 36 mmol/L — ABNORMAL HIGH (ref 22–32)
Calcium: 8.9 mg/dL (ref 8.9–10.3)
Chloride: 93 mmol/L — ABNORMAL LOW (ref 98–111)
Creatinine, Ser: 0.96 mg/dL (ref 0.61–1.24)
GFR, Estimated: >60 mL/min (ref >60)
Glucose, Bld: 188 mg/dL — ABNORMAL HIGH (ref 70–99)
Potassium: 3.8 mmol/L (ref 3.5–5.1)
Sodium: 137 mmol/L (ref 135–145)

## 2023-05-03 LAB — MAGNESIUM: Magnesium: 2 mg/dL (ref 1.7–2.4)

## 2023-05-03 LAB — GLUCOSE, CAPILLARY
Glucose-Capillary: 187 mg/dL — ABNORMAL HIGH (ref 70–99)
Glucose-Capillary: 264 mg/dL — ABNORMAL HIGH (ref 70–99)
Glucose-Capillary: 224 mg/dL — ABNORMAL HIGH (ref 70–99)
Glucose-Capillary: 297 mg/dL — ABNORMAL HIGH (ref 70–99)

## 2023-05-03 NOTE — Progress Notes (Signed)
   Patient Name: Jason [name] Date of Encounter: 05/03/2023 [facility] Cardiologist: [name], MD   Interval Summary  .    82 y.o. male hx of mitral valve stenosis, mitral valve prolapse and MR, atrial flutter, HLD, type 2DM who is being seen for the evaluation of CHF, mitral valve stenosis/regurgitation   No tele Had been in SR   HR stable  Pt is DNR  No chest pain and no SOB today  Vital Signs .    Vitals:   05/02/23 2015 05/03/23 0405 05/03/23 0856 05/03/23 0921  BP:  (!) 117/52  132/60  Pulse:  72 77 77  Resp:  18 18   Temp:  98.2 F (36.8 C)    TempSrc:  Oral    SpO2: 95% 100% 94% 98%  Weight:  128.6 kg    Height:        Intake/Output Summary (Last 24 hours) at 05/03/2023 1000 Last data filed at 05/03/2023 0935 Gross per 24 hour  Intake 900 ml  Output 1775 ml  Net -875 ml      05/03/2023    4:05 AM 05/02/2023    6:18 AM 05/01/2023    5:58 AM  Last 3 Weights  Weight (lbs) 283 lb 8.2 oz 282 lb 10.1 oz 293 lb 3.4 oz  Weight (kg) 128.6 kg 128.2 kg 133 kg      Telemetry/ECG    No tele and no new EKG  - Personally Reviewed  Physical Exam .   GEN: No acute distress.   Neck: No JVD Cardiac: RRR, 2-3/6 murmur, no rubs, or gallops.  Respiratory: Clear to auscultation bilaterally. GI: Soft, nontender, non-distended  MS: No edema Rt foot with dressing post amputation   Assessment & Plan .     CHF/MS/MR  --severe MS not a candidate for surgery and he does not want.   --goal is diuresis and Beta blockade to maximize diastolic filling time.  --neg 13,829 since admit and wt down from pk of 134.4 kg to 128.6 kg loss of  about 13.6 lbs --now on po lasix 40 BID  (was not on lasix prior to admit)  Cr is stable 0.96  normal EF   Atrial flutter now SR with long 1st degree AV block  --back on xarelto  --low dose BB added  metoprolol XL 12.5 mg daily   pulse in 70s  BP stable  For questions or updates, please contact [facility] Please  consult [email or web address] for contact info under        Signed, Nada Boozer, NP

## 2023-05-03 NOTE — Inpatient Diabetes Management (Signed)
Inpatient Diabetes Program Recommendations  AACE/ADA: New Consensus Statement on Inpatient Glycemic Control (2015)  Target Ranges:  Prepandial:   less than 140 mg/dL      Peak postprandial:   less than 180 mg/dL (1-2 hours)      Critically ill patients:  140 - 180 mg/dL   Lab Results  Component Value Date   GLUCAP 264 (H) 05/03/2023   HGBA1C 5.8 (H) 04/26/2023    Review of Glycemic Control  Latest Reference Range & Units 05/02/23 07:25 05/02/23 11:20 05/02/23 16:36 05/02/23 20:26 05/03/23 07:23 05/03/23 11:57  Glucose-Capillary 70 - 99 mg/dL 213 (H) 086 (H) 578 (H) 201 (H) 187 (H) 264 (H)  (H): Data is abnormally high  Inpatient Diabetes Program Recommendations:    Might consider Novolog 3 units TID with meals if he consumes at least 50%  Will continue to follow while inpatient.  Thank you, Dulce Sellar, MSN, CDCES Diabetes Coordinator Inpatient Diabetes Program [phone] (team pager from 8a-5p)

## 2023-05-03 NOTE — TOC Progression Note (Signed)
Transition of Care [facility]) - Progression Note    Patient Details  Name: Jason [name] MRN: [id number] Date of Birth: 1941-10-12  Transition [facility] Care [facility]) CM/SW Contact  Larrie Kass, LCSW Phone Number: 05/03/2023, 10:05 AM  Clinical Narrative:    Insurance auth still pending. TOC to follow.   Expected Discharge Plan: Skilled Nursing Facility Barriers to Discharge: Continued Medical Work up  Expected Discharge Plan and Services   Discharge Planning Services: CM Consult   Living arrangements for the past 2 months: Single Family Home                 DME Arranged: N/A DME Agency: NA         HH Agency: NA         Social Determinants of Health (SDOH) Interventions SDOH Screenings   Food Insecurity: No Food Insecurity (04/26/2023)  Housing: Medium Risk (04/26/2023)  Transportation Needs: No Transportation Needs (04/26/2023)  Utilities: Not At Risk (04/26/2023)  Alcohol Screen: Low Risk  (06/01/2022)  Depression (PHQ2-9): Low Risk  (06/01/2022)  Financial Resource Strain: Low Risk  (06/01/2022)  Physical Activity: Sufficiently Active (06/01/2022)  Social Connections: Socially Integrated (06/01/2022)  Stress: Stress Concern Present (06/01/2022)  Tobacco Use: Medium Risk (04/26/2023)    Readmission Risk Interventions    04/30/2023   12:25 PM  Readmission Risk Prevention Plan  Transportation Screening Complete  PCP or Specialist Appt within 3-5 Days Complete  HRI or Home Care Consult Complete  Social Work Consult for Recovery Care Planning/Counseling Complete  Palliative Care Screening Complete  Medication Review Oceanographer) Complete

## 2023-05-03 NOTE — Progress Notes (Signed)
PROGRESS NOTE    Jason [name]  ZOX:[id number] DOB: 1941/05/28 DOA: 04/26/2023 PCP: [name], MD   Brief Narrative:  82 y.o. male with medical history significant of COPD,  DM2; status post right forefoot amputation recently and currently on oral antibiotics, severe mitral stenosis and regurgitation, history of atrial flutter on Xarelto, HLD, bladder cancer, sleep apnea presented with worsening shortness of breath.  He was noted to be hypoxic with saturations of 86% on room air.  He was subsequently admitted for acute diastolic CHF and started on IV Lasix.  Cardiology was consulted.  Subsequently, IV Lasix has been switched to oral Lasix.  Palliative care has also evaluated the patient and he has been made DNR.  PT recommended SNF placement.  He is currently medically stable for SNF discharge.  Assessment & Plan:   Acute respiratory failure with hypoxia Acute diastolic CHF Severe mitral stenosis; mild mitral regurgitation -strict input and output.  Daily weights.  Fluid restriction.  Cardiology following: IV Lasix has been switched to oral Lasix from 04/30/2023 by cardiology.   -Negative balance of 13,589.5 cc since admission -Still on 2 L oxygen by nasal cannula.  Wean off as able.  Paroxysmal atrial flutter -Currently rate controlled.  Continue Xarelto.  Status post transmetatarsal amputation right foot -Seen by podiatry recently.  There was some concern for infection.  Surgery was a few weeks ago.  Patient was started on Augmentin and doxycycline by podiatry.  Continue same.  Podiatry removed the staples on 04/29/2023 and recommended outpatient follow-up.  ESR was noted to be 50.  CRP less than 0.5.   Diabetes mellitus type 2 with hypoglycemia -Monitor CBGs.  Continue with SSI.  Continue long-acting insulin.  Carb modified diet.  Hyperlipidemia -Continue with statin.   History of COPD -Respiratory status is stable. -No evidence for COPD exacerbation.   Normocytic  anemia Macrocytosis -Stable.   Hyponatremia -Improved.   Obesity -Outpatient follow-up  Goals of care -Overall prognosis is guarded to poor.  Palliative care following.  CODE STATUS has been changed to DNR.  Physical deconditioning -PT recommended SNF placement.  TOC following.  Dementia -Follow delirium precautions.   DVT prophylaxis: Xarelto Code Status: DNR Family Communication: None at bedside Disposition Plan: Status is: Inpatient Remains inpatient appropriate because: Of severity of illness.  Need for SNF placement.  Currently medically stable for discharge to SNF  Consultants: Cardiology.  palliative care  Procedures: None  Antimicrobials: Augmentin and doxycycline   Subjective: Patient seen and examined at bedside.  Poor historian; still confused.  No agitation, fever, vomiting reported.   Objective: Vitals:   05/02/23 1217 05/02/23 1919 05/02/23 2015 05/03/23 0405  BP: 119/62 112/61  (!) 117/52  Pulse: 82 82  72  Resp: 20 18  18   Temp: 97.8 F (36.6 C) 98.6 F (37 C)  98.2 F (36.8 C)  TempSrc: Oral Oral  Oral  SpO2: 94% 96% 95% 100%  Weight:    128.6 kg  Height:        Intake/Output Summary (Last 24 hours) at 05/03/2023 0843 Last data filed at 05/03/2023 0330 Gross per 24 hour  Intake 1143 ml  Output 1275 ml  Net -132 ml    Filed Weights   05/01/23 0558 05/02/23 0618 05/03/23 0405  Weight: 133 kg 128.2 kg 128.6 kg    Examination:  General: No acute distress.  Currently on 2 L oxygen by nasal cannula.  Chronically ill and deconditioned.  Confused, still slow to respond.  Poor historian. respiratory: Bilateral decreased breath sounds at bases with scattered crackles  CVS: S1-S2 heard; rate currently controlled  abdominal: Soft, nontender, mildly distended; no organomegaly; normal bowel sounds heard  extremities: Right foot has a dressing; bilateral lower extremity edema present; no cyanosis   data Reviewed: I have personally reviewed  following labs and imaging studies  CBC: Recent Labs  Lab 04/26/23 1440 04/27/23 0833 04/28/23 0452  WBC 6.9 7.7 7.6  HGB 9.4* 10.3* 9.5*  HCT 31.2* 33.7* 31.5*  MCV 104.7* 104.3* 105.0*  PLT 264 280 271    Basic Metabolic Panel: Recent Labs  Lab 04/26/23 2000 04/27/23 0833 04/28/23 0452 04/29/23 0420 04/30/23 0450 05/01/23 0442 05/02/23 0407 05/03/23 0417  NA  --  135   < > 136 136 135 140 137  K  --  4.7   < > 3.9 3.8 4.4 4.8 3.8  CL  --  95*   < > 91* 92* 92* 93* 93*  CO2  --  30   < > 37* 35* 36* 39* 36*  GLUCOSE  --  204*   < > 105* 190* 286* 181* 188*  BUN  --  15   < > 22 27* 26* 29* 26*  CREATININE  --  0.92   < > 0.96 1.06 0.75 1.07 0.96  CALCIUM  --  8.7*   < > 8.6* 8.4* 8.6* 9.1 8.9  MG 1.8 2.2  --  1.9 1.9 2.0 2.1 2.0  PHOS 4.4 5.3*  --   --   --   --   --   --    < > = values in this interval not displayed.    GFR: Estimated Creatinine Clearance: 93 mL/min (by C-G formula based on SCr of 0.96 mg/dL). Liver Function Tests: Recent Labs  Lab 04/26/23 1440 04/27/23 0833  AST 24 21  ALT 22 24  ALKPHOS 42 46  BILITOT 0.8 1.1  PROT 7.4 8.0  ALBUMIN 3.4* 3.6    No results for input(s): "LIPASE", "AMYLASE" in the last 168 hours. No results for input(s): "AMMONIA" in the last 168 hours. Coagulation Profile: Recent Labs  Lab 04/26/23 1855  INR 2.3*    Cardiac Enzymes: Recent Labs  Lab 04/26/23 2000  CKTOTAL 87    BNP (last 3 results) No results for input(s): "PROBNP" in the last 8760 hours. HbA1C: No results for input(s): "HGBA1C" in the last 72 hours.  CBG: Recent Labs  Lab 05/02/23 0725 05/02/23 1120 05/02/23 1636 05/02/23 2026 05/03/23 0723  GLUCAP 186* 306* 153* 201* 187*    Lipid Profile: No results for input(s): "CHOL", "HDL", "LDLCALC", "TRIG", "CHOLHDL", "LDLDIRECT" in the last 72 hours. Thyroid Function Tests: No results for input(s): "TSH", "T4TOTAL", "FREET4", "T3FREE", "THYROIDAB" in the last 72 hours.  Anemia  Panel: No results for input(s): "VITAMINB12", "FOLATE", "FERRITIN", "TIBC", "IRON", "RETICCTPCT" in the last 72 hours.  Sepsis Labs: Recent Labs  Lab 04/26/23 2000  PROCALCITON <0.10     Recent Results (from the past 240 hour(s))  Resp panel by RT-PCR (RSV, Flu A&B, Covid) Anterior Nasal Swab     Status: None   Collection Time: 04/26/23  2:40 PM   Specimen: Anterior Nasal Swab  Result Value Ref Range Status   SARS Coronavirus 2 by RT PCR NEGATIVE NEGATIVE Final    Comment: (NOTE) SARS-CoV-2 target nucleic acids are NOT DETECTED.  The SARS-CoV-2 RNA is generally detectable in upper respiratory specimens during the acute phase of infection. The lowest concentration  of SARS-CoV-2 viral copies this assay can detect is 138 copies/mL. A negative result does not preclude SARS-Cov-2 infection and should not be used as the sole basis for treatment or other patient management decisions. A negative result may occur with  improper specimen collection/handling, submission of specimen other than nasopharyngeal swab, presence of viral mutation(s) within the areas targeted by this assay, and inadequate number of viral copies(<138 copies/mL). A negative result must be combined with clinical observations, patient history, and epidemiological information. The expected result is Negative.  Fact Sheet for Patients:  BloggerCourse.com  Fact Sheet for Healthcare Providers:  SeriousBroker.it  This test is no t yet approved or cleared by the [place] FDA and  has been authorized for detection and/or diagnosis of SARS-CoV-2 by FDA under an Emergency Use Authorization (EUA). This EUA will remain  in effect (meaning this test can be used) for the duration of the COVID-19 declaration under Section 564(b)(1) of the Act, 21 U.S.C.section 360bbb-3(b)(1), unless the authorization is terminated  or revoked sooner.       Influenza A by PCR NEGATIVE  NEGATIVE Final   Influenza B by PCR NEGATIVE NEGATIVE Final    Comment: (NOTE) The Xpert Xpress SARS-CoV-2/FLU/RSV plus assay is intended as an aid in the diagnosis of influenza from Nasopharyngeal swab specimens and should not be used as a sole basis for treatment. Nasal washings and aspirates are unacceptable for Xpert Xpress SARS-CoV-2/FLU/RSV testing.  Fact Sheet for Patients: BloggerCourse.com  Fact Sheet for Healthcare Providers: SeriousBroker.it  This test is not yet approved or cleared by the [place] FDA and has been authorized for detection and/or diagnosis of SARS-CoV-2 by FDA under an Emergency Use Authorization (EUA). This EUA will remain in effect (meaning this test can be used) for the duration of the COVID-19 declaration under Section 564(b)(1) of the Act, 21 U.S.C. section 360bbb-3(b)(1), unless the authorization is terminated or revoked.     Resp Syncytial Virus by PCR NEGATIVE NEGATIVE Final    Comment: (NOTE) Fact Sheet for Patients: BloggerCourse.com  Fact Sheet for Healthcare Providers: SeriousBroker.it  This test is not yet approved or cleared by the [place] FDA and has been authorized for detection and/or diagnosis of SARS-CoV-2 by FDA under an Emergency Use Authorization (EUA). This EUA will remain in effect (meaning this test can be used) for the duration of the COVID-19 declaration under Section 564(b)(1) of the Act, 21 U.S.C. section 360bbb-3(b)(1), unless the authorization is terminated or revoked.  Performed at [facility], [address] [place], [place] [address]   MRSA Next Gen by PCR, Nasal     Status: None   Collection Time: 04/27/23 12:08 AM   Specimen: Nasal Mucosa; Nasal Swab  Result Value Ref Range Status   MRSA by PCR Next Gen NOT DETECTED NOT DETECTED Final    Comment: (NOTE) The GeneXpert MRSA Assay  (FDA approved for NASAL specimens only), is one component of a comprehensive MRSA colonization surveillance program. It is not intended to diagnose MRSA infection nor to guide or monitor treatment for MRSA infections. Test performance is not FDA approved in patients less than 1 years old. Performed at [facility], [address] [place], [place] [address]          Radiology Studies: No results found.      Scheduled Meds:  amoxicillin-clavulanate  1 tablet Oral Q12H   docusate sodium  100 mg Oral BID   doxycycline  100 mg Oral Q12H  furosemide  40 mg Oral BID   guaiFENesin  600 mg Oral BID   insulin aspart  0-15 Units Subcutaneous TID WC   insulin aspart  0-5 Units Subcutaneous QHS   insulin glargine-yfgn  25 Units Subcutaneous Daily   ipratropium-albuterol  3 mL Nebulization BID   metoprolol succinate  12.5 mg Oral Daily   multivitamin with minerals  1 tablet Oral Daily   polyethylene glycol  17 g Oral Daily   rivaroxaban  20 mg Oral Q supper   rosuvastatin  40 mg Oral Daily   senna  1 tablet Oral BID   sodium chloride flush  3 mL Intravenous Q12H   thiamine  100 mg Oral Daily   Continuous Infusions:  sodium chloride            Glade Lloyd, MD [facility] 05/03/2023, 8:43 AM

## 2023-05-03 NOTE — Progress Notes (Signed)
OT Cancellation Note  [name] MRN: [id number] DOB: 1941-09-07   Cancelled Treatment:    Reason Eval/Treat Not Completed: Patient declined, no reason specified Patient reported he did not want to do occupational therapy today. OT to continue to follow and check back as schedule will allow. [name], MS Acute Rehabilitation Department Office# [phone]  05/03/2023, 3:17 PM

## 2023-05-03 NOTE — Plan of Care (Signed)

## 2023-05-04 DIAGNOSIS — I5031 Acute diastolic (congestive) heart failure: Secondary | ICD-10-CM | POA: Diagnosis not present

## 2023-05-04 LAB — CBC WITH DIFFERENTIAL/PLATELET
Abs Immature Granulocytes: 0.02 10*3/uL (ref 0.00–0.07)
Basophils Absolute: 0 10*3/uL (ref 0.0–0.1)
Basophils Relative: 1 %
Eosinophils Absolute: 0.3 10*3/uL (ref 0.0–0.5)
Eosinophils Relative: 6 %
HCT: 32.5 % — ABNORMAL LOW (ref 39.0–52.0)
Hemoglobin: 10.2 g/dL — ABNORMAL LOW (ref 13.0–17.0)
Immature Granulocytes: 0 %
Lymphocytes Relative: 15 %
Lymphs Abs: 0.8 10*3/uL (ref 0.7–4.0)
MCH: 31.1 pg (ref 26.0–34.0)
MCHC: 31.4 g/dL (ref 30.0–36.0)
MCV: 99.1 fL (ref 80.0–100.0)
Monocytes Absolute: 0.6 10*3/uL (ref 0.1–1.0)
Monocytes Relative: 12 %
Neutro Abs: 3.6 10*3/uL (ref 1.7–7.7)
Neutrophils Relative %: 66 %
Platelets: 239 10*3/uL (ref 150–400)
RBC: 3.28 MIL/uL — ABNORMAL LOW (ref 4.22–5.81)
RDW: 13.9 % (ref 11.5–15.5)
WBC: 5.4 10*3/uL (ref 4.0–10.5)
nRBC: 0 % (ref 0.0–0.2)

## 2023-05-04 LAB — BASIC METABOLIC PANEL
Anion gap: 8 (ref 5–15)
BUN: 26 mg/dL — ABNORMAL HIGH (ref 8–23)
CO2: 33 mmol/L — ABNORMAL HIGH (ref 22–32)
Calcium: 8.7 mg/dL — ABNORMAL LOW (ref 8.9–10.3)
Chloride: 94 mmol/L — ABNORMAL LOW (ref 98–111)
Creatinine, Ser: 0.93 mg/dL (ref 0.61–1.24)
GFR, Estimated: >60 mL/min (ref >60)
Glucose, Bld: 248 mg/dL — ABNORMAL HIGH (ref 70–99)
Potassium: 4 mmol/L (ref 3.5–5.1)
Sodium: 135 mmol/L (ref 135–145)

## 2023-05-04 LAB — GLUCOSE, CAPILLARY
Glucose-Capillary: 249 mg/dL — ABNORMAL HIGH (ref 70–99)
Glucose-Capillary: 440 mg/dL — ABNORMAL HIGH (ref 70–99)
Glucose-Capillary: 313 mg/dL — ABNORMAL HIGH (ref 70–99)
Glucose-Capillary: 298 mg/dL — ABNORMAL HIGH (ref 70–99)

## 2023-05-04 LAB — MAGNESIUM: Magnesium: 2.2 mg/dL (ref 1.7–2.4)

## 2023-05-04 MED ORDER — INSULIN ASPART 100 UNIT/ML IJ SOLN
4.0000 [IU] | Freq: Three times a day (TID) | INTRAMUSCULAR | Status: DC
  Administered 2023-05-04 (×2): 4 [IU] via SUBCUTANEOUS

## 2023-05-04 MED ORDER — INSULIN GLARGINE-YFGN 100 UNIT/ML ~~LOC~~ SOLN
35.0000 [IU] | Freq: Every day | SUBCUTANEOUS | Status: DC
  Administered 2023-05-04: 35 [IU] via SUBCUTANEOUS
  Filled 2023-05-04 (×2): qty 0.35

## 2023-05-04 NOTE — Progress Notes (Signed)
Nutrition Follow-up  DOCUMENTATION CODES:   Not applicable  INTERVENTION:  - Heart Healthy/Carb Modified diet per MD.  - Multivitamin with minerals daily - Monitor weight trends.  Nutrition status is stable at this time, will sign off. Please re-consult if needed.   NUTRITION DIAGNOSIS:   Increased nutrient needs related to chronic illness as evidenced by estimated needs. *ongoing  GOAL:   Patient will meet greater than or equal to 90% of their needs *met  MONITOR:   PO intake, Weight trends  REASON FOR ASSESSMENT:   Consult Assessment of nutrition requirement/status  ASSESSMENT:   82 y.o. male with PMH of COPD,  DM2 sp right forefoot amputation, severe mitral stenosis and regurgitation, HLD, bladder cancer who presented with worsening shortness of breath. Admitted for acute CHF.  Patient endorses great appetite and that he has been eating 3-4 meals a day. Documented to be consuming 100% of all meals. Encouraged patient to continue to eat well.  His weight is decreased from admission (293# to 275#) however patient is -15.7L which is likely affecting weight status.  He is medically stable for discharge, awaiting insurance authorization.   Medications reviewed and include: Colace, Miralax, Senokot, Lasix, MVI, Thiamine, Insulin  Labs reviewed:  HA1C 5.8 Blood Glucose 187-297 x24 hours   Diet Order:   Diet Order             Diet heart healthy/carb modified Room service appropriate? Yes; Fluid consistency: Thin; Fluid restriction: 1500 mL Fluid  Diet effective now                   EDUCATION NEEDS:  Not appropriate for education at this time  Skin:  Skin Assessment: Reviewed RN Assessment Skin Integrity Issues:: Other (Comment) Other: Right foot  Last BM:  6/18  Height:  Ht Readings from Last 1 Encounters:  04/26/23 6\' 8"  (2.032 m)   Weight:  Wt Readings from Last 1 Encounters:  05/04/23 125 kg    BMI:  Body mass index is 30.27  kg/m.  Estimated Nutritional Needs:  Kcal:  2200-2450 kcals Protein:  110-130 grams Fluid:  >/= 2.2L    Shelle Iron RD, LDN For contact information, refer to [facility].

## 2023-05-04 NOTE — Progress Notes (Signed)
Physical Therapy Treatment [name] MRN: [id number] DOB: Aug 31, 1941 Today's Date: 05/04/2023   History of Present Illness 82 yo male admitted with acute on chronic CHF, acute respiratory failure. 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 Other hx: CHF, Dm, mital stenosis, Aflutter bladder Ca, peripheral insufficiency, OSA, memory loss, osteomyelitis    PT Comments    POD # 35 TRANSMETATARSAL AMPUTATION, RIGHT FOOT  General Comments: AxO x 3 required Max encouragement to participate.  Pt also non compliant with his NWBing restriction.  Staples removed on 04/29/23 by Podiatry.  Remains NWB in CAM Boot.  Pt was OOB in recliner.  General transfer comment: Chiropodist and assisted with recliner transfer as well as a toilet transfer.  VC's on proper hand placement and NWBing.  Assisted with peri care and balance during standing on "one leg".  General Gait Details: Pt having great difficulty maintaining NWB R LE.  using  2 gait belt looped at lower walker side to simutale a "WALKER KNEE SLING" attempted to instruct/educate placing distal knee joint on sling and amb 9 feet to BR.  Pt did "better" to keep his R knee on sling and "hop" to and from bathroom at Mod/Max Assist.  Unsteady esp with turns.  VC's to increase support B UE's on walker. Pt was able to perform correclty amb to and from the bathroom 9 feet x 2 with MAX VC's.  Positioned in recliner to comfort and elevated R LE.  Pt will need ST Rehab at SNF to address mobility and functional decline prior to safely returning home.   Recommendations for follow up therapy are one component of a multi-disciplinary discharge planning process, led by the attending physician.  Recommendations may be updated based on patient status, additional functional criteria and insurance authorization.  Follow Up Recommendations       Assistance Recommended at Discharge Frequent or constant  Supervision/Assistance  Patient can return home with the following Assist for transportation;Help with stairs or ramp for entrance;Assistance with cooking/housework;A little help with walking and/or transfers;A little help with bathing/dressing/bathroom   Equipment Recommendations  None recommended by PT    Recommendations for Other Services       Precautions / Restrictions Precautions Precautions: Fall Required Braces or Orthoses: Other Brace Other Brace: CAM boot in room Restrictions Weight Bearing Restrictions: Yes RLE Weight Bearing: Non weight bearing Other Position/Activity Restrictions: pt has been noncompliant with NWB status     Mobility  Bed Mobility               General bed mobility comments: OOB in recliner    Transfers Overall transfer level: Needs assistance Equipment used: Rolling walker (2 wheels) Transfers: Sit to/from Stand Sit to Stand: Min guard, Min assist           General transfer comment: Chiropodist and assisted with recliner transfer as well as a toilet transfer.  VC's on proper hand placement and NWBing.  Assisted with peri care and balance during standing on "one leg".    Ambulation/Gait Ambulation/Gait assistance: Mod assist, Max assist Gait Distance (Feet): 18 Feet Assistive device: Rolling walker (2 wheels) (2 gait belts to simulate a knee sling on walker) Gait Pattern/deviations: Step-to pattern, Decreased stance time - right Gait velocity: decreased     General Gait Details: Pt having great difficulty maintaining NWB R LE.  using  2 gait belt looped at lower walker side to simutale a "WALKER KNEE  SLING" attempted to instruct/educate placing distal knee joint on sling and amb 9 feet to BR.  Pt did "better" to keep his R knee on sling and "hop" to and from bathroom at Mod/Max Assist.  Unsteady esp with turns.  VC's to increase support B UE's on walker.  Pt was able to perform correclty amb to and from the bathroom 9 feet x 2  with VC's.   Stairs             Wheelchair Mobility    Modified Rankin (Stroke Patients Only)       Balance                                            Cognition Arousal/Alertness: Awake/alert Behavior During Therapy: WFL for tasks assessed/performed Overall Cognitive Status: History of cognitive impairments - at baseline                                 General Comments: AxO x 3 required Max encouragement to participate.        Exercises      General Comments        Pertinent Vitals/Pain Pain Assessment Pain Assessment: Faces Faces Pain Scale: Hurts a little bit Pain Location: RIGHT foot Pain Descriptors / Indicators: Discomfort, Operative site guarding, Aching Pain Intervention(s): Monitored during session, Repositioned    Home Living                          Prior Function            PT Goals (current goals can now be found in the care plan section) Progress towards PT goals: Progressing toward goals    Frequency    Min 1X/week      PT Plan Current plan remains appropriate    Co-evaluation              AM-PAC PT "6 Clicks" Mobility   Outcome Measure  Help needed turning from your back to your side while in a flat bed without using bedrails?: A Little Help needed moving from lying on your back to sitting on the side of a flat bed without using bedrails?: A Little Help needed moving to and from a bed to a chair (including a wheelchair)?: A Lot Help needed standing up from a chair using your arms (e.g., wheelchair or bedside chair)?: A Lot Help needed to walk in hospital room?: A Lot Help needed climbing 3-5 steps with a railing? : Total 6 Click Score: 13    End of Session Equipment Utilized During Treatment: Gait belt Activity Tolerance: Patient limited by fatigue Patient left: in chair;with call bell/phone within reach;with chair alarm set Nurse Communication: Mobility status PT Visit  Diagnosis: Difficulty in walking, not elsewhere classified (R26.2)     Time: 1610-9604 PT Time Calculation (min) (ACUTE ONLY): 28 min  Charges:  $Gait Training: 8-22 mins $Therapeutic Activity: 8-22 mins                     {Damean Poffenberger  PTA Acute  [employer] M-F          [phone]

## 2023-05-04 NOTE — Progress Notes (Signed)
   05/04/23 2000  BiPAP/CPAP/SIPAP  Reason BIPAP/CPAP not in use Other(comment) (Bipap PRN order, not indicated at this time.)

## 2023-05-04 NOTE — TOC Progression Note (Addendum)
Transition of Care [facility]) - Progression Note    Patient Details  Name: Jason [name] MRN: [id number] Date of Birth: 1941/10/09  [facility] CM/SW Contact  Larrie Kass, LCSW Phone Number: 05/04/2023, 9:24 AM  Clinical Narrative:    Insurance auth still pending. TOC to follow.  ADDEN 3:00pm TOC received a call from [facility], they are requesting OT notes and note on prior level of functioning. CSW to fax therapy notes to [phone]. TOC to follow.   Expected Discharge Plan: Skilled Nursing Facility Barriers to Discharge: Continued Medical Work up  Expected Discharge Plan and Services   Discharge Planning Services: CM Consult   Living arrangements for the past 2 months: Single Family Home                 DME Arranged: N/A DME Agency: NA         HH Agency: NA         Social Determinants of Health (SDOH) Interventions SDOH Screenings   Food Insecurity: No Food Insecurity (04/26/2023)  Housing: Medium Risk (04/26/2023)  Transportation Needs: No Transportation Needs (04/26/2023)  Utilities: Not At Risk (04/26/2023)  Alcohol Screen: Low Risk  (06/01/2022)  Depression (PHQ2-9): Low Risk  (06/01/2022)  Financial Resource Strain: Low Risk  (06/01/2022)  Physical Activity: Sufficiently Active (06/01/2022)  Social Connections: Socially Integrated (06/01/2022)  Stress: Stress Concern Present (06/01/2022)  Tobacco Use: Medium Risk (04/26/2023)    Readmission Risk Interventions    04/30/2023   12:25 PM  Readmission Risk Prevention Plan  Transportation Screening Complete  PCP or Specialist Appt within 3-5 Days Complete  HRI or Home Care Consult Complete  Social Work Consult for Recovery Care Planning/Counseling Complete  Palliative Care Screening Complete  Medication Review Oceanographer) Complete

## 2023-05-04 NOTE — Progress Notes (Signed)
PROGRESS NOTE    Jason [name]  ZOX:[id number] DOB: [birth year] DOA: 04/26/2023 PCP: [name], MD    Brief Narrative:   Jason [name] is a 82 y.o. male with past medical history significant for chronic diastolic congestive heart failure, atrial flutter on Xarelto, HLD, DM2, COPD, severe mitral stenosis with regurgitation, bladder cancer, OSA, who presented to [facility] ED on 6/11 via EMS from his PCP office due to low oxygen saturation.  Per EMS patient was oxygenating 83% on room air and complaining of shortness of breath with exertion.  Was placed on 2 L nasal cannula; and transported to the ED for further evaluation.  Denies fever, reports nonproductive cough.  In the ED, VBG with pH 7.3, P O2 less than 31.  WBC 6.9, hemoglobin 9.4, platelets 264.  D-dimer 1.53.  Sodium 131, potassium 4.5, chloride 95, CO2 29, glucose 74, BUN 14, creatinine 0.83.  AST 24, ALT 22, total bilirubin 0.8.  BNP elevated 364.1.  High sensitive troponin 31 followed by 24.  COVID/influenza/RSV PCR negative.  Chest x-ray with cardiac enlargement with small bilateral pleural effusions and perihilar/basilar infiltrates likely edema.  CT angiogram chest with no evidence of significant pulmonary embolism, cardiac enlargement, moderate bilateral pleural effusions with bilateral basilar consolidation likely pneumonia versus compressive atelectasis.  Patient was given IV diuresis.  TRH consulted for admission for further evaluation management of acute respite failure secondary to CHF exacerbation.  Assessment & Plan:   Acute respiratory failure with hypoxia Acute diastolic CHF exacerbation Patient presenting to ED with progressive shortness of breath and noted to be hypoxic at PCP office with SpO2 83% on room air.  Patient with elevated BNP, imaging studies consistent with pulmonary edema/pleural effusion, negative for pulmonary embolism.  RSV/influenza A/B/COVID PCR negative.  Patient was started on IV  furosemide and cardiology was consulted and followed during hospital course; now transition to oral furosemide. -- Net negative 2.1 L past 24 hours, net  negative 15.7 L since hospitalization -- wt 134>>125kg -- 1500 mL fluid restriction -- Furosemide 40 mg p.o. twice daily -- Strict I's and O's and daily weights  Hx osteomyelitis first ray right foot with ulceration/necrosis of bone s/p transmetatarsal amputation Wound dehiscence Patient with recent transmetatarsal amputation of right foot for osteomyelitis of first ray, ulceration with necrosis of the bone by podiatry, Dr. [name] First forward on 03/30/2023.  Continues on antibiotics per podiatry x 14 days.  Podiatry removed staples on 04/29/2023 with slight debridement of wound. -- Continue Augmentin/doxycycline x 14 days with projected end date 05/06/2023 --Continue daily wet-to-dry Betadine dressing changes -- Nonweightbearing in cam boot -- Outpatient follow-up with podiatry as scheduled  Paroxysmal atrial flutter -- Metoprolol succinate 12.5 mg p.o. daily -- Continue Xarelto  Severe mitral stenosis/mitral regurgitation Per cardiology not a candidate for surgery.  Outpatient follow-up with [name].  Type 2 diabetes mellitus -- Semglee increased to 35 units [place] daily -- Start Novolog 4 u TIDAC (if eating greater than 50% of meals) -- Moderate SSI for coverage -- CBG before every meal/at bedtime  Hyperlipidemia -- Crestor 40 mg p.o. daily  COPD Not oxygen dependent at baseline, no evidence for exacerbation.  Anemia Hemoglobin 10.2, stable.  Hyponatremia Etiology likely secondary to volume overload.  Improved with IV diuresis.  Obesity Body mass index is 30.27 kg/m. Discussed with patient needs for aggressive lifestyle changes/weight loss as this complicates all facets of care.  Outpatient follow-up with PCP.    Dementia --Delirium precautions --Get up  during the day --Encourage a familiar face to remain present  throughout the day --Keep blinds open and lights on during daylight hours --Minimize the use of opioids/benzodiazepines  Weakness/debility/deconditioning: Seen by PT/OT with recommendation of SNF placement. --TOC following, pending insurance authorization  Goals of care: Seem palliative care during hospitalization, now DNR.  Overall long-term prognosis poor.     DVT prophylaxis: SCDs Start: 04/26/23 2318 rivaroxaban (XARELTO) tablet 20 mg    Code Status: DNR Family Communication: No family present at bedside this morning  Disposition Plan:  Level of care: Med-Surg Status is: Inpatient Remains inpatient appropriate because: Medically stable for discharge to SNF once SNF bed available/receive insurance authorization    Consultants:  Cardiology Palliative care   Procedures:  Bedside debridement and staple removal by podiatry on 6/14  Antimicrobials:  Augmentin/doxycycline, continued from outpatient   Subjective: Patient seen examined bedside, resting comfortably.  Sitting in bedside chair, just completed breakfast.  RN present at bedside.  Discussed recommendations for SNF placement given his deconditioning, debility and need to remain nonweightbearing to his right lower extremity due to recent surgery.  He is in agreement with this this morning and understands necessity.  Otherwise no other specific complaints or concerns at this time.  Remains on oral diuretics that were changed by cardiology and medically stable for discharge to SNF once insurance authorization returns.  Denies headache, no dizziness, no chest pain, no palpitations, no shortness of breath, no abdominal pain, no fever/chills/night sweats, no nausea cefonicid diarrhea, no focal weakness, no fatigue, no paresthesias.  No acute events overnight per nurse staff.  Objective: Vitals:   05/03/23 2008 05/04/23 0426 05/04/23 0500 05/04/23 1408  BP: 120/84 119/64  108/64  Pulse: 77 78  77  Resp: 18 17  16   Temp:  99.2 F (37.3 C) 98.6 F (37 C)  98 F (36.7 C)  TempSrc: Oral Oral  Oral  SpO2: 93% 95%  100%  Weight:   125 kg   Height:        Intake/Output Summary (Last 24 hours) at 05/04/2023 1545 Last data filed at 05/04/2023 1347 Gross per 24 hour  Intake 900 ml  Output 1600 ml  Net -700 ml   Filed Weights   05/02/23 0618 05/03/23 0405 05/04/23 0500  Weight: 128.2 kg 128.6 kg 125 kg    Examination:  Physical Exam: GEN: NAD, alert and oriented x 3, chronically ill in appearance HEENT: NCAT, PERRL, EOMI, sclera clear, MMM PULM: CTAB w/o wheezes/crackles, normal respiratory effort, on room air CV: RRR w/o M/G/R GI: abd soft, NTND, NABS, no R/G/M MSK: no peripheral edema, muscle strength globally intact 5/5 bilateral upper/lower extremities NEURO: CN II-XII intact, no focal deficits, sensation to light touch intact PSYCH: normal mood/affect Integumentary: right foot with dressing/ace wrap in place; as depicted below; otherwise no other concerning rashes/lesions/wounds noted exposed skin surfaces     Data Reviewed: I have personally reviewed following labs and imaging studies  CBC: Recent Labs  Lab 04/28/23 0452 05/04/23 0441  WBC 7.6 5.4  NEUTROABS  --  3.6  HGB 9.5* 10.2*  HCT 31.5* 32.5*  MCV 105.0* 99.1  PLT 271 239   Basic Metabolic Panel: Recent Labs  Lab 04/30/23 0450 05/01/23 0442 05/02/23 0407 05/03/23 0417 05/04/23 0441  NA 136 135 140 137 135  K 3.8 4.4 4.8 3.8 4.0  CL 92* 92* 93* 93* 94*  CO2 35* 36* 39* 36* 33*  GLUCOSE 190* 286* 181* 188* 248*  BUN 27* 26*  29* 26* 26*  CREATININE 1.06 0.75 1.07 0.96 0.93  CALCIUM 8.4* 8.6* 9.1 8.9 8.7*  MG 1.9 2.0 2.1 2.0 2.2   GFR: Estimated Creatinine Clearance: 94.8 mL/min (by C-G formula based on SCr of 0.93 mg/dL). Liver Function Tests: No results for input(s): "AST", "ALT", "ALKPHOS", "BILITOT", "PROT", "ALBUMIN" in the last 168 hours. No results for input(s): "LIPASE", "AMYLASE" in the last 168 hours. No  results for input(s): "AMMONIA" in the last 168 hours. Coagulation Profile: No results for input(s): "INR", "PROTIME" in the last 168 hours. Cardiac Enzymes: No results for input(s): "CKTOTAL", "CKMB", "CKMBINDEX", "TROPONINI" in the last 168 hours. BNP (last 3 results) No results for input(s): "PROBNP" in the last 8760 hours. HbA1C: No results for input(s): "HGBA1C" in the last 72 hours. CBG: Recent Labs  Lab 05/03/23 1157 05/03/23 1648 05/03/23 2059 05/04/23 0732 05/04/23 1209  GLUCAP 264* 224* 297* 249* 440*   Lipid Profile: No results for input(s): "CHOL", "HDL", "LDLCALC", "TRIG", "CHOLHDL", "LDLDIRECT" in the last 72 hours. Thyroid Function Tests: No results for input(s): "TSH", "T4TOTAL", "FREET4", "T3FREE", "THYROIDAB" in the last 72 hours. Anemia Panel: No results for input(s): "VITAMINB12", "FOLATE", "FERRITIN", "TIBC", "IRON", "RETICCTPCT" in the last 72 hours. Sepsis Labs: No results for input(s): "PROCALCITON", "LATICACIDVEN" in the last 168 hours.  Recent Results (from the past 240 hour(s))  Resp panel by RT-PCR (RSV, Flu A&B, Covid) Anterior Nasal Swab     Status: None   Collection Time: 04/26/23  2:40 PM   Specimen: Anterior Nasal Swab  Result Value Ref Range Status   SARS Coronavirus 2 by RT PCR NEGATIVE NEGATIVE Final    Comment: (NOTE) SARS-CoV-2 target nucleic acids are NOT DETECTED.  The SARS-CoV-2 RNA is generally detectable in upper respiratory specimens during the acute phase of infection. The lowest concentration of SARS-CoV-2 viral copies this assay can detect is 138 copies/mL. A negative result does not preclude SARS-Cov-2 infection and should not be used as the sole basis for treatment or other patient management decisions. A negative result may occur with  improper specimen collection/handling, submission of specimen other than nasopharyngeal swab, presence of viral mutation(s) within the areas targeted by this assay, and inadequate number of  viral copies(<138 copies/mL). A negative result must be combined with clinical observations, patient history, and epidemiological information. The expected result is Negative.  Fact Sheet for Patients:  BloggerCourse.com  Fact Sheet for Healthcare Providers:  SeriousBroker.it  This test is no t yet approved or cleared by the [place] FDA and  has been authorized for detection and/or diagnosis of SARS-CoV-2 by FDA under an Emergency Use Authorization (EUA). This EUA will remain  in effect (meaning this test can be used) for the duration of the COVID-19 declaration under Section 564(b)(1) of the Act, 21 U.S.C.section 360bbb-3(b)(1), unless the authorization is terminated  or revoked sooner.       Influenza A by PCR NEGATIVE NEGATIVE Final   Influenza B by PCR NEGATIVE NEGATIVE Final    Comment: (NOTE) The Xpert Xpress SARS-CoV-2/FLU/RSV plus assay is intended as an aid in the diagnosis of influenza from Nasopharyngeal swab specimens and should not be used as a sole basis for treatment. Nasal washings and aspirates are unacceptable for Xpert Xpress SARS-CoV-2/FLU/RSV testing.  Fact Sheet for Patients: BloggerCourse.com  Fact Sheet for Healthcare Providers: SeriousBroker.it  This test is not yet approved or cleared by the [place] FDA and has been authorized for detection and/or diagnosis of SARS-CoV-2 by FDA under an Emergency Use  Authorization (EUA). This EUA will remain in effect (meaning this test can be used) for the duration of the COVID-19 declaration under Section 564(b)(1) of the Act, 21 U.S.C. section 360bbb-3(b)(1), unless the authorization is terminated or revoked.     Resp Syncytial Virus by PCR NEGATIVE NEGATIVE Final    Comment: (NOTE) Fact Sheet for Patients: BloggerCourse.com  Fact Sheet for Healthcare  Providers: SeriousBroker.it  This test is not yet approved or cleared by the [place] FDA and has been authorized for detection and/or diagnosis of SARS-CoV-2 by FDA under an Emergency Use Authorization (EUA). This EUA will remain in effect (meaning this test can be used) for the duration of the COVID-19 declaration under Section 564(b)(1) of the Act, 21 U.S.C. section 360bbb-3(b)(1), unless the authorization is terminated or revoked.  Performed at [facility], [address] [place], [place] [address]   MRSA Next Gen by PCR, Nasal     Status: None   Collection Time: 04/27/23 12:08 AM   Specimen: Nasal Mucosa; Nasal Swab  Result Value Ref Range Status   MRSA by PCR Next Gen NOT DETECTED NOT DETECTED Final    Comment: (NOTE) The GeneXpert MRSA Assay (FDA approved for NASAL specimens only), is one component of a comprehensive MRSA colonization surveillance program. It is not intended to diagnose MRSA infection nor to guide or monitor treatment for MRSA infections. Test performance is not FDA approved in patients less than 47 years old. Performed at [facility], [address], [place], [place] [address]          Radiology Studies: No results found.      Scheduled Meds:  amoxicillin-clavulanate  1 tablet Oral Q12H   docusate sodium  100 mg Oral BID   doxycycline  100 mg Oral Q12H   furosemide  40 mg Oral BID   guaiFENesin  600 mg Oral BID   insulin aspart  0-15 Units Subcutaneous TID WC   insulin aspart  0-5 Units Subcutaneous QHS   insulin aspart  4 Units Subcutaneous TID WC   insulin glargine-yfgn  35 Units Subcutaneous Daily   metoprolol succinate  12.5 mg Oral Daily   multivitamin with minerals  1 tablet Oral Daily   polyethylene glycol  17 g Oral Daily   rivaroxaban  20 mg Oral Q supper   rosuvastatin  40 mg Oral Daily   senna  1 tablet Oral BID   sodium chloride flush  3 mL Intravenous  Q12H   thiamine  100 mg Oral Daily   Continuous Infusions:  sodium chloride       LOS: 8 days    Time spent: 46 minutes spent on chart review, discussion with nursing staff, consultants, updating family and interview/physical exam; more than 50% of that time was spent in counseling and/or coordination of care.    Alvira Philips [place], DO [facility] Available via Epic secure chat 7am-7pm After these hours, please refer to coverage provider listed on [email or web address] 05/04/2023, 3:45 PM

## 2023-05-04 NOTE — Progress Notes (Signed)
Occupational Therapy Treatment [name] MRN: [id number] DOB: Jun 16, 1941 Today's Date: 05/04/2023   History of present illness 82 yo male admitted with acute on chronic CHF, acute respiratory failure. 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 Other hx: CHF, Dm, mital stenosis, Aflutter bladder Ca, peripheral insufficiency, OSA, memory loss, osteomyelitis   OT comments  Patient was seated in recliner asking to get back to bed. Patient was able to engage in grooming tasks seated with education provided on NWB restrictions. Patient reported that he likes to do things his way. Patient declined to work on balance or transfer out of recliner when offered. Nurse made aware. Patient's discharge plan remains appropriate at this time. OT will continue to follow acutely.     Recommendations for follow up therapy are one component of a multi-disciplinary discharge planning process, led by the attending physician.  Recommendations may be updated based on patient status, additional functional criteria and insurance authorization.    Assistance Recommended at Discharge Frequent or constant Supervision/Assistance  Patient can return home with the following  A lot of help with walking and/or transfers;A lot of help with bathing/dressing/bathroom;Assistance with cooking/housework;Direct supervision/assist for medications management;Direct supervision/assist for financial management;Assist for transportation;Help with stairs or ramp for entrance   Equipment Recommendations  None recommended by OT       Precautions / Restrictions Precautions Precautions: Fall Required Braces or Orthoses: Other Brace Other Brace: CAM boot in room Restrictions Weight Bearing Restrictions: Yes RLE Weight Bearing: Non weight bearing Other Position/Activity Restrictions: pt has been noncompliant with NWB status       Mobility Bed Mobility                General bed mobility comments: patient was in recliner and remained in the same at the end of the session.            ADL either performed or assessed with clinical judgement   ADL Overall ADL's : Needs assistance/impaired     Grooming: Set up;Sitting;Wash/dry hands;Wash/dry face;Oral care;Brushing hair Grooming Details (indicate cue type and reason): sitting in recliner with education on importance of completing these tasks each day.                   Toilet Transfer Details (indicate cue type and reason): patient declined to participate in transfers or standing balance tasks at this time reporting that he was comfortable in chair when patient asked multiple times to get back into bed upon entrance to room. nurse made aware.                  Cognition Arousal/Alertness: Awake/alert Behavior During Therapy: WFL for tasks assessed/performed Overall Cognitive Status: History of cognitive impairments - at baseline       General Comments: patient was plesant but confused at times during session. patient reported that he does like to do things his way. at entry to room patient asked to get back into bed. when offered to help patient get into bed patient declined did not remember conversations with repeating statements during session                   Pertinent Vitals/ Pain       Pain Assessment Pain Assessment: Faces Faces Pain Scale: No hurt         Frequency  Min 1X/week        Progress Toward Goals  OT Goals(current goals can now be  found in the care plan section)  Progress towards OT goals: Progressing toward goals     Plan Discharge plan remains appropriate       AM-PAC OT "6 Clicks" Daily Activity     Outcome Measure   Help from another person eating meals?: None Help from another person taking care of personal grooming?: A Little Help from another person toileting, which includes using toliet, bedpan, or urinal?: A Lot Help from  another person bathing (including washing, rinsing, drying)?: A Lot Help from another person to put on and taking off regular upper body clothing?: A Little Help from another person to put on and taking off regular lower body clothing?: A Lot 6 Click Score: 16    End of Session    OT Visit Diagnosis: Unsteadiness on feet (R26.81);Other abnormalities of gait and mobility (R26.89);Muscle weakness (generalized) (M62.81)   Activity Tolerance Patient tolerated treatment well   Patient Left with call bell/phone within reach;in chair;with chair alarm set   Nurse Communication Mobility status        Time: 4098-1191 OT Time Calculation (min): 26 min  Charges: OT General Charges $OT Visit: 1 Visit OT Treatments $Self Care/Home Management : 23-37 mins  [name], MS Acute Rehabilitation Department Office# [phone]   Selinda Flavin 05/04/2023, 3:09 PM

## 2023-05-05 DIAGNOSIS — I5031 Acute diastolic (congestive) heart failure: Secondary | ICD-10-CM | POA: Diagnosis not present

## 2023-05-05 LAB — GLUCOSE, CAPILLARY
Glucose-Capillary: 137 mg/dL — ABNORMAL HIGH (ref 70–99)
Glucose-Capillary: 186 mg/dL — ABNORMAL HIGH (ref 70–99)
Glucose-Capillary: 76 mg/dL (ref 70–99)
Glucose-Capillary: 100 mg/dL — ABNORMAL HIGH (ref 70–99)
Glucose-Capillary: 73 mg/dL (ref 70–99)
Glucose-Capillary: 197 mg/dL — ABNORMAL HIGH (ref 70–99)

## 2023-05-05 MED ORDER — INSULIN GLARGINE-YFGN 100 UNIT/ML ~~LOC~~ SOLN
50.0000 [IU] | Freq: Every day | SUBCUTANEOUS | Status: DC
  Administered 2023-05-05 – 2023-05-06 (×2): 50 [IU] via SUBCUTANEOUS
  Filled 2023-05-05 (×2): qty 0.5

## 2023-05-05 MED ORDER — INSULIN ASPART 100 UNIT/ML IJ SOLN
8.0000 [IU] | Freq: Three times a day (TID) | INTRAMUSCULAR | Status: DC
  Administered 2023-05-05 – 2023-05-07 (×7): 8 [IU] via SUBCUTANEOUS

## 2023-05-05 NOTE — Plan of Care (Signed)
  Problem: Metabolic: Goal: Ability to maintain appropriate glucose levels will improve Outcome: Progressing   Problem: Nutritional: Goal: Maintenance of adequate nutrition will improve Outcome: Progressing   Problem: Education: Goal: Knowledge of General Education information will improve Description: Including pain rating scale, medication(s)/side effects and non-pharmacologic comfort measures Outcome: Progressing   Problem: Health Behavior/Discharge Planning: Goal: Ability to manage health-related needs will improve Outcome: Progressing   Problem: Clinical Measurements: Goal: Will remain free from infection Outcome: Progressing Goal: Diagnostic test results will improve Outcome: Progressing Goal: Respiratory complications will improve Outcome: Progressing Goal: Cardiovascular complication will be avoided Outcome: Progressing   Problem: Activity: Goal: Risk for activity intolerance will decrease Outcome: Progressing   Problem: Nutrition: Goal: Adequate nutrition will be maintained Outcome: Progressing   Problem: Coping: Goal: Level of anxiety will decrease Outcome: Progressing   Problem: Elimination: Goal: Will not experience complications related to bowel motility Outcome: Progressing Goal: Will not experience complications related to urinary retention Outcome: Progressing   Problem: Pain Managment: Goal: General experience of comfort will improve Outcome: Progressing

## 2023-05-05 NOTE — Care Management Important Message (Signed)
Important Message  Patient Details IM Letter given Name: Jason [name] MRN: [id number] Date of Birth: [birth year]   Medicare Important Message Given:  Yes     Caren Macadam 05/05/2023, 10:36 AM

## 2023-05-05 NOTE — TOC Progression Note (Signed)
Transition of Care [facility]) - Progression Note    Patient Details  Name: Jason [name] MRN: [id number] Date of Birth: 09-29-1941  [facility] of [facility]) CM/SW Contact  Larrie Kass, LCSW Phone Number: 05/05/2023, 9:32 AM  Clinical Narrative:    Insurance auth pending for SNF placement. TOC to follow.   Expected Discharge Plan: Skilled Nursing Facility Barriers to Discharge: Continued Medical Work up  Expected Discharge Plan and Services   Discharge Planning Services: CM Consult   Living arrangements for the past 2 months: Single Family Home                 DME Arranged: N/A DME Agency: NA         HH Agency: NA         Social Determinants of Health (SDOH) Interventions SDOH Screenings   Food Insecurity: No Food Insecurity (04/26/2023)  Housing: Medium Risk (04/26/2023)  Transportation Needs: No Transportation Needs (04/26/2023)  Utilities: Not At Risk (04/26/2023)  Alcohol Screen: Low Risk  (06/01/2022)  Depression (PHQ2-9): Low Risk  (06/01/2022)  Financial Resource Strain: Low Risk  (06/01/2022)  Physical Activity: Sufficiently Active (06/01/2022)  Social Connections: Socially Integrated (06/01/2022)  Stress: Stress Concern Present (06/01/2022)  Tobacco Use: Medium Risk (04/26/2023)    Readmission Risk Interventions    04/30/2023   12:25 PM  Readmission Risk Prevention Plan  Transportation Screening Complete  PCP or Specialist Appt within 3-5 Days Complete  HRI or Home Care Consult Complete  Social Work Consult for Recovery Care Planning/Counseling Complete  Palliative Care Screening Complete  Medication Review Oceanographer) Complete

## 2023-05-05 NOTE — Progress Notes (Signed)
PROGRESS NOTE    [name]  ZOX:[id number] DOB: [birth year] DOA: 04/26/2023 PCP: [name], MD    Brief Narrative:   [name] is a 82 y.o. male with past medical history significant for chronic diastolic congestive heart failure, atrial flutter on Xarelto, HLD, DM2, COPD, severe mitral stenosis with regurgitation, bladder cancer, OSA, who presented to [name] on 6/11 via EMS from his PCP office due to low oxygen saturation.  Per EMS patient was oxygenating 83% on room air and complaining of shortness of breath with exertion.  Was placed on 2 L nasal cannula; and transported to the ED for further evaluation.  Denies fever, reports nonproductive cough.  In the ED, VBG with pH 7.3, P O2 less than 31.  WBC 6.9, hemoglobin 9.4, platelets 264.  D-dimer 1.53.  Sodium 131, potassium 4.5, chloride 95, CO2 29, glucose 74, BUN 14, creatinine 0.83.  AST 24, ALT 22, total bilirubin 0.8.  BNP elevated 364.1.  High sensitive troponin 31 followed by 24.  COVID/influenza/RSV PCR negative.  Chest x-ray with cardiac enlargement with small bilateral pleural effusions and perihilar/basilar infiltrates likely edema.  CT angiogram chest with no evidence of significant pulmonary embolism, cardiac enlargement, moderate bilateral pleural effusions with bilateral basilar consolidation likely pneumonia versus compressive atelectasis.  Patient was given IV diuresis.  TRH consulted for admission for further evaluation management of acute respite failure secondary to CHF exacerbation.  Assessment & Plan:   Acute respiratory failure with hypoxia Acute diastolic CHF exacerbation Patient presenting to ED with progressive shortness of breath and noted to be hypoxic at PCP office with SpO2 83% on room air.  Patient with elevated BNP, imaging studies consistent with pulmonary edema/pleural effusion, negative for pulmonary embolism.  RSV/influenza A/B/COVID PCR negative.  Patient was started on IV  furosemide and cardiology was consulted and followed during hospital course; now transition to oral furosemide. -- Net negative 780 mL past 24 hours, net  negative 16.5 L since hospitalization -- wt 134>>125kg -- 1500 mL fluid restriction -- Furosemide 40 mg p.o. twice daily -- Strict I's and O's and daily weights  Hx osteomyelitis first ray right foot with ulceration/necrosis of bone s/p transmetatarsal amputation Wound dehiscence Patient with recent transmetatarsal amputation of right foot for osteomyelitis of first ray, ulceration with necrosis of the bone by podiatry, Dr. [name] First forward on 03/30/2023.  Continues on antibiotics per podiatry x 14 days.  Podiatry removed staples on 04/29/2023 with slight debridement of wound. -- Continue Augmentin/doxycycline x 14 days with projected end date 05/06/2023 -- Continue daily wet-to-dry Betadine dressing changes -- Nonweightbearing RLE in cam boot -- Outpatient follow-up with podiatry as scheduled  Paroxysmal atrial flutter -- Metoprolol succinate 12.5 mg p.o. daily -- Continue Xarelto  Severe mitral stenosis/mitral regurgitation Per cardiology not a candidate for surgery.  Outpatient follow-up with [name].  Type 2 diabetes mellitus -- Semglee increased to 50 units [place] daily -- Novolog 8 u TIDAC (if eating greater than 50% of meals) -- Moderate SSI for coverage -- CBG before every meal/at bedtime  Hyperlipidemia -- Crestor 40 mg p.o. daily  COPD Not oxygen dependent at baseline, no evidence for exacerbation.  Anemia Hemoglobin 10.2, stable.  Hyponatremia Etiology likely secondary to volume overload.  Improved with IV diuresis.  Obesity Body mass index is 30.27 kg/m. Discussed with patient needs for aggressive lifestyle changes/weight loss as this complicates all facets of care.  Outpatient follow-up with PCP.    Dementia --Delirium precautions --Get  up during the day --Encourage a familiar face to remain present  throughout the day --Keep blinds open and lights on during daylight hours --Minimize the use of opioids/benzodiazepines  Weakness/debility/deconditioning: Seen by PT/OT with recommendation of SNF placement. --TOC following, pending insurance authorization  Goals of care: Seen by palliative care during hospitalization, now DNR.  Overall long-term prognosis poor.     DVT prophylaxis: SCDs Start: 04/26/23 2318 rivaroxaban (XARELTO) tablet 20 mg    Code Status: DNR Family Communication: No family present at bedside this morning  Disposition Plan:  Level of care: Med-Surg Status is: Inpatient Remains inpatient appropriate because: Medically stable for discharge to SNF once SNF bed available/receive insurance authorization    Consultants:  Cardiology Palliative care   Procedures:  Bedside debridement and staple removal by podiatry on 6/14  Antimicrobials:  Augmentin/doxycycline, continued from outpatient   Subjective: Patient seen examined bedside, resting comfortably.  Sitting in bedside chair, just completed breakfast.  RN present at bedside.  Discussed recommendations for SNF placement given his deconditioning, debility and need to remain nonweightbearing to his right lower extremity due to recent surgery.  He is in agreement with this this morning and understands necessity.  Otherwise no other specific complaints or concerns at this time.  Remains on oral diuretics that were changed by cardiology and medically stable for discharge to SNF once insurance authorization returns.  Denies headache, no dizziness, no chest pain, no palpitations, no shortness of breath, no abdominal pain, no fever/chills/night sweats, no nausea/vomiting/diarrhea, no focal weakness, no fatigue, no paresthesias.  No acute events overnight per nurse staff.  Objective: Vitals:   05/04/23 1408 05/04/23 2125 05/05/23 0436 05/05/23 1125  BP: 108/64 133/66 127/62 104/71  Pulse: 77 84 78 74  Resp: 16 18 18   (!) 24  Temp: 98 F (36.7 C) 98.9 F (37.2 C) 98.8 F (37.1 C) 98.1 F (36.7 C)  TempSrc: Oral Oral Oral Oral  SpO2: 100% 97% 96% 99%  Weight:      Height:        Intake/Output Summary (Last 24 hours) at 05/05/2023 1331 Last data filed at 05/05/2023 1100 Gross per 24 hour  Intake 1093 ml  Output 1950 ml  Net -857 ml   Filed Weights   05/02/23 0618 05/03/23 0405 05/04/23 0500  Weight: 128.2 kg 128.6 kg 125 kg    Examination:  Physical Exam: GEN: NAD, alert and oriented x 3, chronically ill in appearance HEENT: NCAT, PERRL, EOMI, sclera clear, MMM PULM: CTAB w/o wheezes/crackles, normal respiratory effort, on room air CV: RRR w/o M/G/R GI: abd soft, NTND, NABS, no R/G/M MSK: no peripheral edema, muscle strength globally intact 5/5 bilateral upper/lower extremities NEURO: CN II-XII intact, no focal deficits, sensation to light touch intact PSYCH: normal mood/affect Integumentary: right foot with dressing/ace wrap in place; as depicted below; otherwise no other concerning rashes/lesions/wounds noted exposed skin surfaces     Data Reviewed: I have personally reviewed following labs and imaging studies  CBC: Recent Labs  Lab 05/04/23 0441  WBC 5.4  NEUTROABS 3.6  HGB 10.2*  HCT 32.5*  MCV 99.1  PLT 239   Basic Metabolic Panel: Recent Labs  Lab 04/30/23 0450 05/01/23 0442 05/02/23 0407 05/03/23 0417 05/04/23 0441  NA 136 135 140 137 135  K 3.8 4.4 4.8 3.8 4.0  CL 92* 92* 93* 93* 94*  CO2 35* 36* 39* 36* 33*  GLUCOSE 190* 286* 181* 188* 248*  BUN 27* 26* 29* 26* 26*  CREATININE 1.06 0.75  1.07 0.96 0.93  CALCIUM 8.4* 8.6* 9.1 8.9 8.7*  MG 1.9 2.0 2.1 2.0 2.2   GFR: Estimated Creatinine Clearance: 94.8 mL/min (by C-G formula based on SCr of 0.93 mg/dL). Liver Function Tests: No results for input(s): "AST", "ALT", "ALKPHOS", "BILITOT", "PROT", "ALBUMIN" in the last 168 hours. No results for input(s): "LIPASE", "AMYLASE" in the last 168 hours. No results  for input(s): "AMMONIA" in the last 168 hours. Coagulation Profile: No results for input(s): "INR", "PROTIME" in the last 168 hours. Cardiac Enzymes: No results for input(s): "CKTOTAL", "CKMB", "CKMBINDEX", "TROPONINI" in the last 168 hours. BNP (last 3 results) No results for input(s): "PROBNP" in the last 8760 hours. HbA1C: No results for input(s): "HGBA1C" in the last 72 hours. CBG: Recent Labs  Lab 05/04/23 1209 05/04/23 1634 05/04/23 2050 05/05/23 0736 05/05/23 1121  GLUCAP 440* 313* 298* 137* 186*   Lipid Profile: No results for input(s): "CHOL", "HDL", "LDLCALC", "TRIG", "CHOLHDL", "LDLDIRECT" in the last 72 hours. Thyroid Function Tests: No results for input(s): "TSH", "T4TOTAL", "FREET4", "T3FREE", "THYROIDAB" in the last 72 hours. Anemia Panel: No results for input(s): "VITAMINB12", "FOLATE", "FERRITIN", "TIBC", "IRON", "RETICCTPCT" in the last 72 hours. Sepsis Labs: No results for input(s): "PROCALCITON", "LATICACIDVEN" in the last 168 hours.  Recent Results (from the past 240 hour(s))  Resp panel by RT-PCR (RSV, Flu A&B, Covid) Anterior Nasal Swab     Status: None   Collection Time: 04/26/23  2:40 PM   Specimen: Anterior Nasal Swab  Result Value Ref Range Status   SARS Coronavirus 2 by RT PCR NEGATIVE NEGATIVE Final    Comment: (NOTE) SARS-CoV-2 target nucleic acids are NOT DETECTED.  The SARS-CoV-2 RNA is generally detectable in upper respiratory specimens during the acute phase of infection. The lowest concentration of SARS-CoV-2 viral copies this assay can detect is 138 copies/mL. A negative result does not preclude SARS-Cov-2 infection and should not be used as the sole basis for treatment or other patient management decisions. A negative result may occur with  improper specimen collection/handling, submission of specimen other than nasopharyngeal swab, presence of viral mutation(s) within the areas targeted by this assay, and inadequate number of  viral copies(<138 copies/mL). A negative result must be combined with clinical observations, patient history, and epidemiological information. The expected result is Negative.  Fact Sheet for Patients:  BloggerCourse.com  Fact Sheet for Healthcare Providers:  SeriousBroker.it  This test is no t yet approved or cleared by the [place] FDA and  has been authorized for detection and/or diagnosis of SARS-CoV-2 by FDA under an Emergency Use Authorization (EUA). This EUA will remain  in effect (meaning this test can be used) for the duration of the COVID-19 declaration under Section 564(b)(1) of the Act, 21 U.S.C.section 360bbb-3(b)(1), unless the authorization is terminated  or revoked sooner.       Influenza A by PCR NEGATIVE NEGATIVE Final   Influenza B by PCR NEGATIVE NEGATIVE Final    Comment: (NOTE) The Xpert Xpress SARS-CoV-2/FLU/RSV plus assay is intended as an aid in the diagnosis of influenza from Nasopharyngeal swab specimens and should not be used as a sole basis for treatment. Nasal washings and aspirates are unacceptable for Xpert Xpress SARS-CoV-2/FLU/RSV testing.  Fact Sheet for Patients: BloggerCourse.com  Fact Sheet for Healthcare Providers: SeriousBroker.it  This test is not yet approved or cleared by the [place] FDA and has been authorized for detection and/or diagnosis of SARS-CoV-2 by FDA under an Emergency Use Authorization (EUA). This EUA will remain in  effect (meaning this test can be used) for the duration of the COVID-19 declaration under Section 564(b)(1) of the Act, 21 U.S.C. section 360bbb-3(b)(1), unless the authorization is terminated or revoked.     Resp Syncytial Virus by PCR NEGATIVE NEGATIVE Final    Comment: (NOTE) Fact Sheet for Patients: BloggerCourse.com  Fact Sheet for Healthcare  Providers: SeriousBroker.it  This test is not yet approved or cleared by the [place] FDA and has been authorized for detection and/or diagnosis of SARS-CoV-2 by FDA under an Emergency Use Authorization (EUA). This EUA will remain in effect (meaning this test can be used) for the duration of the COVID-19 declaration under Section 564(b)(1) of the Act, 21 U.S.C. section 360bbb-3(b)(1), unless the authorization is terminated or revoked.  Performed at [facility], [address] [place], [place] [address]   MRSA Next Gen by PCR, Nasal     Status: None   Collection Time: 04/27/23 12:08 AM   Specimen: Nasal Mucosa; Nasal Swab  Result Value Ref Range Status   MRSA by PCR Next Gen NOT DETECTED NOT DETECTED Final    Comment: (NOTE) The GeneXpert MRSA Assay (FDA approved for NASAL specimens only), is one component of a comprehensive MRSA colonization surveillance program. It is not intended to diagnose MRSA infection nor to guide or monitor treatment for MRSA infections. Test performance is not FDA approved in patients less than 61 years old. Performed at [facility], [address] [place], [place] [address]          Radiology Studies: No results found.      Scheduled Meds:  amoxicillin-clavulanate  1 tablet Oral Q12H   docusate sodium  100 mg Oral BID   doxycycline  100 mg Oral Q12H   furosemide  40 mg Oral BID   guaiFENesin  600 mg Oral BID   insulin aspart  0-15 Units Subcutaneous TID WC   insulin aspart  0-5 Units Subcutaneous QHS   insulin aspart  8 Units Subcutaneous TID WC   insulin glargine-yfgn  50 Units Subcutaneous Daily   metoprolol succinate  12.5 mg Oral Daily   multivitamin with minerals  1 tablet Oral Daily   polyethylene glycol  17 g Oral Daily   rivaroxaban  20 mg Oral Q supper   rosuvastatin  40 mg Oral Daily   senna  1 tablet Oral BID   sodium chloride flush  3 mL Intravenous  Q12H   thiamine  100 mg Oral Daily   Continuous Infusions:  sodium chloride       LOS: 9 days    Time spent: 46 minutes spent on chart review, discussion with nursing staff, consultants, updating family and interview/physical exam; more than 50% of that time was spent in counseling and/or coordination of care.    Alvira Philips [place], DO [facility] Available via Epic secure chat 7am-7pm After these hours, please refer to coverage provider listed on [email or web address] 05/05/2023, 1:31 PM

## 2023-05-06 ENCOUNTER — Encounter: Payer: Medicare HMO | Admitting: Podiatry

## 2023-05-06 DIAGNOSIS — I5031 Acute diastolic (congestive) heart failure: Secondary | ICD-10-CM | POA: Diagnosis not present

## 2023-05-06 LAB — GLUCOSE, CAPILLARY
Glucose-Capillary: 66 mg/dL — ABNORMAL LOW (ref 70–99)
Glucose-Capillary: 74 mg/dL (ref 70–99)
Glucose-Capillary: 140 mg/dL — ABNORMAL HIGH (ref 70–99)
Glucose-Capillary: 390 mg/dL — ABNORMAL HIGH (ref 70–99)
Glucose-Capillary: 170 mg/dL — ABNORMAL HIGH (ref 70–99)

## 2023-05-06 MED ORDER — INSULIN GLARGINE-YFGN 100 UNIT/ML ~~LOC~~ SOLN
40.0000 [IU] | Freq: Every day | SUBCUTANEOUS | Status: DC
  Administered 2023-05-07 – 2023-05-10 (×4): 40 [IU] via SUBCUTANEOUS
  Filled 2023-05-06 (×4): qty 0.4

## 2023-05-06 NOTE — TOC Progression Note (Addendum)
Transition of Care [facility]) - Progression Note    Patient Details  Name: [name] MRN: [id number] Date of Birth: 16-Aug-1941  Transition of Care [facility]) CM/SW Contact  Otelia Santee, LCSW Phone Number: 05/06/2023, 12:46 PM  Clinical Narrative:    Received fax of denial for pt's insurance authorization. Pt denied due to "...having heart and breathing troubles. Not wanting to do daily skilled therapy, and requiring little assistance to care for self."  No peer to peer information was offered for this pt. Spoke with pt's spouse who will be completing a fast appeal. CSW walked pt's wife through process and provided all information that would be asked for.  TOC will await appeal decision.   Update 3:00pm- Pt's appeal case ID is [id number]. Medical records and signed appointment of representative faxed to [phone]. Per insurance provider decision for appeal should be made by 05/09/23.    Expected Discharge Plan: Skilled Nursing Facility Barriers to Discharge: Continued Medical Work up  Expected Discharge Plan and Services   Discharge Planning Services: CM Consult   Living arrangements for the past 2 months: Single Family Home                 DME Arranged: N/A DME Agency: NA         HH Agency: NA         Social Determinants of Health (SDOH) Interventions SDOH Screenings   Food Insecurity: No Food Insecurity (04/26/2023)  Housing: Medium Risk (04/26/2023)  Transportation Needs: No Transportation Needs (04/26/2023)  Utilities: Not At Risk (04/26/2023)  Alcohol Screen: Low Risk  (06/01/2022)  Depression (PHQ2-9): Low Risk  (06/01/2022)  Financial Resource Strain: Low Risk  (06/01/2022)  Physical Activity: Sufficiently Active (06/01/2022)  Social Connections: Socially Integrated (06/01/2022)  Stress: Stress Concern Present (06/01/2022)  Tobacco Use: Medium Risk (04/26/2023)    Readmission Risk Interventions    04/30/2023   12:25 PM  Readmission Risk Prevention Plan   Transportation Screening Complete  PCP or Specialist Appt within 3-5 Days Complete  HRI or Home Care Consult Complete  Social Work Consult for Recovery Care Planning/Counseling Complete  Palliative Care Screening Complete  Medication Review [employer]) Complete

## 2023-05-06 NOTE — Progress Notes (Signed)
Physical Therapy Treatment [name] MRN: [id number] DOB: May 07, 1941 Today's Date: 05/06/2023   History of Present Illness 82 yo male admitted with acute on chronic CHF, acute respiratory failure. 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 Other hx: CHF, Dm, mital stenosis, Aflutter bladder Ca, peripheral insufficiency, OSA, memory loss, osteomyelitis    PT Comments    General Comments: AxO x 3 more agreeable to participate.  "I want to get home",  "I have to be able to do for myself", stated pt.  Assisted OOB went well.  General bed mobility comments: Therapist applied CAM BOOT.  Pt self able to trasfer to EOB using rail and increased time.  sitting balance is good. General transfer comment: VC's on NWBing and proper use of walker.  Assisted with a toilet transfer as well with VC's on safety with turns.  Practiced standing on ONE leg static stance for peri care. General Gait Details: Pt having great difficulty maintaining NWB R LE.  using  3 gait belt looped at lower walker side to simutale a "WALKER KNEE SLING" attempted to instruct/educate placing distal knee joint on sling and amb 9 feet to BR.  Pt did "better" to keep his R knee on sling and "hop" to and from bathroom at Mod/Max Assist.  Unsteady esp with turns.  VC's to increase support B UE's on walker.  Pt was able to perform correclty amb to and from the bathroom 9 feet x 2 with VC's.  Positioned in recliner to comfort and R LE ekevated. Pt will need ST Rehab at SNF to address mobility and functional decline prior to safely returning home.   Recommendations for follow up therapy are one component of a multi-disciplinary discharge planning process, led by the attending physician.  Recommendations may be updated based on patient status, additional functional criteria and insurance authorization.  Follow Up Recommendations  Can patient physically be transported by private  vehicle: No    Assistance Recommended at Discharge Frequent or constant Supervision/Assistance  Patient can return home with the following Assist for transportation;Help with stairs or ramp for entrance;Assistance with cooking/housework;A little help with walking and/or transfers;A little help with bathing/dressing/bathroom   Equipment Recommendations  None recommended by PT    Recommendations for Other Services       Precautions / Restrictions Precautions Precautions: Fall Required Braces or Orthoses: Other Brace Other Brace: CAM boot in room Restrictions Weight Bearing Restrictions: Yes RLE Weight Bearing: Non weight bearing Other Position/Activity Restrictions: requires MAX VC's to comply with NWBing     Mobility  Bed Mobility Overal bed mobility: Needs Assistance Bed Mobility: Supine to Sit     Supine to sit: Supervision     General bed mobility comments: Therapist applied CAM BOOT.  Pt self able to trasfer to EOB using rail and increased time.  sitting balance is good.    Transfers Overall transfer level: Needs assistance Equipment used: Rolling walker (2 wheels) Transfers: Sit to/from Stand Sit to Stand: Min guard, Min assist           General transfer comment: VC's on NWBing and proper use of walker.  Assisted with a toilet transfer as well with VC's on safety with turns.  Practiced standing on ONE leg static stance for peri care.    Ambulation/Gait Ambulation/Gait assistance: Mod assist, Max assist Gait Distance (Feet): 18 Feet Assistive device: Rolling walker (2 wheels) Gait Pattern/deviations: Step-to pattern, Decreased stance time - right Gait velocity:  decreased     General Gait Details: Pt having great difficulty maintaining NWB R LE.  using  3 gait belt looped at lower walker side to simutale a "WALKER KNEE SLING" attempted to instruct/educate placing distal knee joint on sling and amb 9 feet to BR.  Pt did "better" to keep his R knee on sling and  "hop" to and from bathroom at Mod/Max Assist.  Unsteady esp with turns.  VC's to increase support B UE's on walker.  Pt was able to perform correclty amb to and from the bathroom 9 feet x 2 with VC's.   Stairs             Wheelchair Mobility    Modified Rankin (Stroke Patients Only)       Balance                                            Cognition Arousal/Alertness: Awake/alert Behavior During Therapy: WFL for tasks assessed/performed                                   General Comments: AxO x 3 more agreeable to participate.  "I want to get home",  "I have to be able to do for myself", stated pt        Exercises      General Comments        Pertinent Vitals/Pain Pain Assessment Pain Assessment: No/denies pain    Home Living                          Prior Function            PT Goals (current goals can now be found in the care plan section) Progress towards PT goals: Progressing toward goals    Frequency    Min 1X/week      PT Plan Current plan remains appropriate    Co-evaluation              AM-PAC PT "6 Clicks" Mobility   Outcome Measure  Help needed turning from your back to your side while in a flat bed without using bedrails?: A Little Help needed moving from lying on your back to sitting on the side of a flat bed without using bedrails?: A Little Help needed moving to and from a bed to a chair (including a wheelchair)?: A Lot Help needed standing up from a chair using your arms (e.g., wheelchair or bedside chair)?: A Lot Help needed to walk in hospital room?: A Lot Help needed climbing 3-5 steps with a railing? : Total 6 Click Score: 13    End of Session Equipment Utilized During Treatment: Gait belt Activity Tolerance: Patient limited by fatigue Patient left: in chair;with call bell/phone within reach;with chair alarm set Nurse Communication: Mobility status PT Visit Diagnosis:  Difficulty in walking, not elsewhere classified (R26.2)     Time: 2956-2130 PT Time Calculation (min) (ACUTE ONLY): 32 min  Charges:  $Gait Training: 8-22 mins $Therapeutic Activity: 8-22 mins                     {Khamani Fairley  PTA Acute  [employer] M-F          [phone]

## 2023-05-06 NOTE — Progress Notes (Signed)
PROGRESS NOTE    Jason [name]  FUX:[id number] DOB: May 24, 1941 DOA: 04/26/2023 PCP: [name], MD    Brief Narrative:   Jason [name] is a 82 y.o. male with past medical history significant for chronic diastolic congestive heart failure, atrial flutter on Xarelto, HLD, DM2, COPD, severe mitral stenosis with regurgitation, bladder cancer, OSA, who presented to [facility] ED on 6/11 via EMS from his PCP office due to low oxygen saturation.  Per EMS patient was oxygenating 83% on room air and complaining of shortness of breath with exertion.  Was placed on 2 L nasal cannula; and transported to the ED for further evaluation.  Denies fever, reports nonproductive cough.  In the ED, VBG with pH 7.3, P O2 less than 31.  WBC 6.9, hemoglobin 9.4, platelets 264.  D-dimer 1.53.  Sodium 131, potassium 4.5, chloride 95, CO2 29, glucose 74, BUN 14, creatinine 0.83.  AST 24, ALT 22, total bilirubin 0.8.  BNP elevated 364.1.  High sensitive troponin 31 followed by 24.  COVID/influenza/RSV PCR negative.  Chest x-ray with cardiac enlargement with small bilateral pleural effusions and perihilar/basilar infiltrates likely edema.  CT angiogram chest with no evidence of significant pulmonary embolism, cardiac enlargement, moderate bilateral pleural effusions with bilateral basilar consolidation likely pneumonia versus compressive atelectasis.  Patient was given IV diuresis.  TRH consulted for admission for further evaluation management of acute respite failure secondary to CHF exacerbation.  Assessment & Plan:   Acute respiratory failure with hypoxia Acute diastolic CHF exacerbation Patient presenting to ED with progressive shortness of breath and noted to be hypoxic at PCP office with SpO2 83% on room air.  Patient with elevated BNP, imaging studies consistent with pulmonary edema/pleural effusion, negative for pulmonary embolism.  RSV/influenza A/B/COVID PCR negative.  Patient was started on IV  furosemide and cardiology was consulted and followed during hospital course; now transition to oral furosemide. -- Net negative 780 mL past 24 hours, net  negative 16.5 L since hospitalization -- wt 134>>125kg -- 1500 mL fluid restriction -- Furosemide 40 mg p.o. twice daily -- Strict I's and O's and daily weights  Hx osteomyelitis first ray right foot with ulceration/necrosis of bone s/p transmetatarsal amputation Wound dehiscence Patient with recent transmetatarsal amputation of right foot for osteomyelitis of first ray, ulceration with necrosis of the bone by podiatry, Dr. [name] First forward on 03/30/2023.  Continues on antibiotics per podiatry x 14 days.  Podiatry removed staples on 04/29/2023 with slight debridement of wound. -- Continue Augmentin/doxycycline x 14 days with projected end date 05/06/2023 -- Continue daily wet-to-dry Betadine dressing changes -- Nonweightbearing RLE in cam boot -- Outpatient follow-up with podiatry as scheduled  Paroxysmal atrial flutter -- Metoprolol succinate 12.5 mg p.o. daily -- Continue Xarelto  Severe mitral stenosis/mitral regurgitation Per cardiology not a candidate for surgery.  Outpatient follow-up with [name].  Type 2 diabetes mellitus -- Semglee 40 units  daily -- Novolog 8 u TIDAC (if eating greater than 50% of meals) -- Moderate SSI for coverage -- CBG before every meal/at bedtime  Hyperlipidemia -- Crestor 40 mg p.o. daily  COPD Not oxygen dependent at baseline, no evidence for exacerbation.  Anemia Hemoglobin 10.2, stable.  Hyponatremia Etiology likely secondary to volume overload.  Improved with IV diuresis.  Obesity Body mass index is 30.01 kg/m. Discussed with patient needs for aggressive lifestyle changes/weight loss as this complicates all facets of care.  Outpatient follow-up with PCP.    Dementia --Delirium precautions --Get up during  the day --Encourage a familiar face to remain present throughout the  day --Keep blinds open and lights on during daylight hours --Minimize the use of opioids/benzodiazepines  Weakness/debility/deconditioning: Seen by PT/OT with recommendation of SNF placement. --TOC following, pending insurance authorization  Goals of care: Seen by palliative care during hospitalization, now DNR.  Overall long-term prognosis poor.     DVT prophylaxis: rivaroxaban (XARELTO) tablet 20 mg Start: 04/27/23 1700 SCDs Start: 04/26/23 2318 rivaroxaban (XARELTO) tablet 20 mg    Code Status: DNR Family Communication: No family present at bedside this morning  Disposition Plan:  Level of care: Med-Surg Status is: Inpatient Remains inpatient appropriate because: Medically stable for discharge to SNF once SNF bed available/receive insurance authorization    Consultants:  Cardiology Palliative care   Procedures:  Bedside debridement and staple removal by podiatry on 6/14  Antimicrobials:  Augmentin/doxycycline, continued from outpatient   Subjective: Patient seen examined bedside, resting comfortably.  Lying in bed sleeping but easily arousable.  No specific complaints this morning.  Awaiting insurance authorization for SNF placement.  Otherwise no other specific complaints or concerns at this time.  Remains on oral diuretics that were changed by cardiology and medically stable for discharge to SNF once insurance authorization returns.  Denies headache, no dizziness, no chest pain, no palpitations, no shortness of breath, no abdominal pain, no fever/chills/night sweats, no nausea/vomiting/diarrhea, no focal weakness, no fatigue, no paresthesias.  No acute events overnight per nurse staff.  Objective: Vitals:   05/05/23 2147 05/05/23 2350 05/06/23 0500 05/06/23 0554  BP: (!) 107/59 (!) 104/58  (!) 116/57  Pulse: 72 74  70  Resp: 17 18  18   Temp: 98 F (36.7 C) 98.2 F (36.8 C)  98.6 F (37 C)  TempSrc: Oral Oral    SpO2: 99% 96%  95%  Weight:   123.9 kg    Height:        Intake/Output Summary (Last 24 hours) at 05/06/2023 1123 Last data filed at 05/06/2023 1000 Gross per 24 hour  Intake 305 ml  Output 1975 ml  Net -1670 ml   Filed Weights   05/05/23 0855 05/05/23 1709 05/06/23 0500  Weight: 122.4 kg 122.4 kg 123.9 kg    Examination:  Physical Exam: GEN: NAD, alert and oriented x 3, chronically ill in appearance HEENT: NCAT, PERRL, EOMI, sclera clear, MMM PULM: CTAB w/o wheezes/crackles, normal respiratory effort, on room air CV: RRR w/o M/G/R GI: abd soft, NTND, NABS, no R/G/M MSK: no peripheral edema, muscle strength globally intact 5/5 bilateral upper/lower extremities NEURO: CN II-XII intact, no focal deficits, sensation to light touch intact PSYCH: normal mood/affect Integumentary: right foot with dressing/ace wrap in place; as depicted below; otherwise no other concerning rashes/lesions/wounds noted exposed skin surfaces     Data Reviewed: I have personally reviewed following labs and imaging studies  CBC: Recent Labs  Lab 05/04/23 0441  WBC 5.4  NEUTROABS 3.6  HGB 10.2*  HCT 32.5*  MCV 99.1  PLT 239   Basic Metabolic Panel: Recent Labs  Lab 04/30/23 0450 05/01/23 0442 05/02/23 0407 05/03/23 0417 05/04/23 0441  NA 136 135 140 137 135  K 3.8 4.4 4.8 3.8 4.0  CL 92* 92* 93* 93* 94*  CO2 35* 36* 39* 36* 33*  GLUCOSE 190* 286* 181* 188* 248*  BUN 27* 26* 29* 26* 26*  CREATININE 1.06 0.75 1.07 0.96 0.93  CALCIUM 8.4* 8.6* 9.1 8.9 8.7*  MG 1.9 2.0 2.1 2.0 2.2   GFR: Estimated Creatinine  Clearance: 94.5 mL/min (by C-G formula based on SCr of 0.93 mg/dL). Liver Function Tests: No results for input(s): "AST", "ALT", "ALKPHOS", "BILITOT", "PROT", "ALBUMIN" in the last 168 hours. No results for input(s): "LIPASE", "AMYLASE" in the last 168 hours. No results for input(s): "AMMONIA" in the last 168 hours. Coagulation Profile: No results for input(s): "INR", "PROTIME" in the last 168 hours. Cardiac  Enzymes: No results for input(s): "CKTOTAL", "CKMB", "CKMBINDEX", "TROPONINI" in the last 168 hours. BNP (last 3 results) No results for input(s): "PROBNP" in the last 8760 hours. HbA1C: No results for input(s): "HGBA1C" in the last 72 hours. CBG: Recent Labs  Lab 05/05/23 1734 05/05/23 1806 05/05/23 2151 05/06/23 0802 05/06/23 0817  GLUCAP 73 100* 197* 66* 74   Lipid Profile: No results for input(s): "CHOL", "HDL", "LDLCALC", "TRIG", "CHOLHDL", "LDLDIRECT" in the last 72 hours. Thyroid Function Tests: No results for input(s): "TSH", "T4TOTAL", "FREET4", "T3FREE", "THYROIDAB" in the last 72 hours. Anemia Panel: No results for input(s): "VITAMINB12", "FOLATE", "FERRITIN", "TIBC", "IRON", "RETICCTPCT" in the last 72 hours. Sepsis Labs: No results for input(s): "PROCALCITON", "LATICACIDVEN" in the last 168 hours.  Recent Results (from the past 240 hour(s))  Resp panel by RT-PCR (RSV, Flu A&B, Covid) Anterior Nasal Swab     Status: None   Collection Time: 04/26/23  2:40 PM   Specimen: Anterior Nasal Swab  Result Value Ref Range Status   SARS Coronavirus 2 by RT PCR NEGATIVE NEGATIVE Final    Comment: (NOTE) SARS-CoV-2 target nucleic acids are NOT DETECTED.  The SARS-CoV-2 RNA is generally detectable in upper respiratory specimens during the acute phase of infection. The lowest concentration of SARS-CoV-2 viral copies this assay can detect is 138 copies/mL. A negative result does not preclude SARS-Cov-2 infection and should not be used as the sole basis for treatment or other patient management decisions. A negative result may occur with  improper specimen collection/handling, submission of specimen other than nasopharyngeal swab, presence of viral mutation(s) within the areas targeted by this assay, and inadequate number of viral copies(<138 copies/mL). A negative result must be combined with clinical observations, patient history, and epidemiological information. The  expected result is Negative.  Fact Sheet for Patients:  BloggerCourse.com  Fact Sheet for Healthcare Providers:  SeriousBroker.it  This test is no t yet approved or cleared by the [place] FDA and  has been authorized for detection and/or diagnosis of SARS-CoV-2 by FDA under an Emergency Use Authorization (EUA). This EUA will remain  in effect (meaning this test can be used) for the duration of the COVID-19 declaration under Section 564(b)(1) of the Act, 21 U.S.C.section 360bbb-3(b)(1), unless the authorization is terminated  or revoked sooner.       Influenza A by PCR NEGATIVE NEGATIVE Final   Influenza B by PCR NEGATIVE NEGATIVE Final    Comment: (NOTE) The Xpert Xpress SARS-CoV-2/FLU/RSV plus assay is intended as an aid in the diagnosis of influenza from Nasopharyngeal swab specimens and should not be used as a sole basis for treatment. Nasal washings and aspirates are unacceptable for Xpert Xpress SARS-CoV-2/FLU/RSV testing.  Fact Sheet for Patients: BloggerCourse.com  Fact Sheet for Healthcare Providers: SeriousBroker.it  This test is not yet approved or cleared by the [place] FDA and has been authorized for detection and/or diagnosis of SARS-CoV-2 by FDA under an Emergency Use Authorization (EUA). This EUA will remain in effect (meaning this test can be used) for the duration of the COVID-19 declaration under Section 564(b)(1) of the Act, 21 U.S.C.  section 360bbb-3(b)(1), unless the authorization is terminated or revoked.     Resp Syncytial Virus by PCR NEGATIVE NEGATIVE Final    Comment: (NOTE) Fact Sheet for Patients: BloggerCourse.com  Fact Sheet for Healthcare Providers: SeriousBroker.it  This test is not yet approved or cleared by the [place] FDA and has been authorized for detection and/or  diagnosis of SARS-CoV-2 by FDA under an Emergency Use Authorization (EUA). This EUA will remain in effect (meaning this test can be used) for the duration of the COVID-19 declaration under Section 564(b)(1) of the Act, 21 U.S.C. section 360bbb-3(b)(1), unless the authorization is terminated or revoked.  Performed at [facility], [address], [place], [place] [address]   MRSA Next Gen by PCR, Nasal     Status: None   Collection Time: 04/27/23 12:08 AM   Specimen: Nasal Mucosa; Nasal Swab  Result Value Ref Range Status   MRSA by PCR Next Gen NOT DETECTED NOT DETECTED Final    Comment: (NOTE) The GeneXpert MRSA Assay (FDA approved for NASAL specimens only), is one component of a comprehensive MRSA colonization surveillance program. It is not intended to diagnose MRSA infection nor to guide or monitor treatment for MRSA infections. Test performance is not FDA approved in patients less than 39 years old. Performed at [facility], [address] [place], [place] [address]          Radiology Studies: No results found.      Scheduled Meds:  docusate sodium  100 mg Oral BID   furosemide  40 mg Oral BID   guaiFENesin  600 mg Oral BID   insulin aspart  0-15 Units Subcutaneous TID WC   insulin aspart  0-5 Units Subcutaneous QHS   insulin aspart  8 Units Subcutaneous TID WC   [START ON 05/07/2023] insulin glargine-yfgn  40 Units Subcutaneous Daily   metoprolol succinate  12.5 mg Oral Daily   multivitamin with minerals  1 tablet Oral Daily   polyethylene glycol  17 g Oral Daily   rivaroxaban  20 mg Oral Q supper   rosuvastatin  40 mg Oral Daily   senna  1 tablet Oral BID   sodium chloride flush  3 mL Intravenous Q12H   thiamine  100 mg Oral Daily   Continuous Infusions:  sodium chloride       LOS: 10 days    Time spent: 46 minutes spent on chart review, discussion with nursing staff, consultants, updating family and  interview/physical exam; more than 50% of that time was spent in counseling and/or coordination of care.    Alvira Philips [place], DO [facility] Available via Epic secure chat 7am-7pm After these hours, please refer to coverage provider listed on [email or web address] 05/06/2023, 11:23 AM

## 2023-05-06 NOTE — Plan of Care (Signed)
  Problem: Education: Goal: Ability to describe self-care measures that may prevent or decrease complications (Diabetes Survival Skills Education) will improve Outcome: Progressing   Problem: Coping: Goal: Ability to adjust to condition or change in health will improve Outcome: Progressing   Problem: Fluid Volume: Goal: Ability to maintain a balanced intake and output will improve Outcome: Progressing   Problem: Health Behavior/Discharge Planning: Goal: Ability to identify and utilize available resources and services will improve Outcome: Progressing   Problem: Skin Integrity: Goal: Risk for impaired skin integrity will decrease Outcome: Progressing   Problem: Clinical Measurements: Goal: Ability to maintain clinical measurements within normal limits will improve Outcome: Progressing Goal: Will remain free from infection Outcome: Progressing   Problem: Activity: Goal: Risk for activity intolerance will decrease Outcome: Progressing   Problem: Nutrition: Goal: Adequate nutrition will be maintained Outcome: Progressing   Problem: Pain Managment: Goal: General experience of comfort will improve Outcome: Progressing   Problem: Safety: Goal: Ability to remain free from injury will improve Outcome: Progressing

## 2023-05-06 NOTE — Inpatient Diabetes Management (Signed)
Inpatient Diabetes Program Recommendations  AACE/ADA: New Consensus Statement on Inpatient Glycemic Control (2015)  Target Ranges:  Prepandial:   less than 140 mg/dL      Peak postprandial:   less than 180 mg/dL (1-2 hours)      Critically ill patients:  140 - 180 mg/dL   Lab Results  Component Value Date   GLUCAP 74 05/06/2023   HGBA1C 5.8 (H) 04/26/2023    Review of Glycemic Control  Latest Reference Range & Units 05/06/23 08:02 05/06/23 08:17  Glucose-Capillary 70 - 99 mg/dL 66 (L) 74  (L): Data is abnormally low  Diabetes history: DM2 Outpatient Diabetes medications:  Toujeo 40 units every day, Novolog 8 units TID and Metformin 1 gm BID Current orders for Inpatient glycemic control: Semglee 50 units QAM, Novolog 0-15 units TID and 0-5 units at bedtime, Novolog 8 units TID  Inpatient Diabetes Program Recommendations:    Was 66 mg/dL this morning.  Please decrease Semglee to 40 units QAM.  Will continue to follow while inpatient.  Thank you, Dulce Sellar, MSN, CDCES Diabetes Coordinator Inpatient Diabetes Program [phone] (team pager from 8a-5p)

## 2023-05-06 NOTE — TOC Progression Note (Signed)
Transition of Care [facility]) - Progression Note   Patient Details  Name: Jason [name] MRN: [id number] Date of Birth: November 30, 1940  [facility] CM/SW Contact  [name], LCSW Phone Number: 05/06/2023, 10:03 AM  Clinical Narrative: Insurance authorization remains pending on the Availity portal.  Expected Discharge Plan: Skilled Nursing Facility Barriers to Discharge: Continued Medical Work up  Expected Discharge Plan and Services Discharge Planning Services: CM Consult Living arrangements for the past 2 months: Single Family Home            DME Arranged: N/A DME Agency: NA HH Agency: NA  Social Determinants of Health (SDOH) Interventions SDOH Screenings   Food Insecurity: No Food Insecurity (04/26/2023)  Housing: Medium Risk (04/26/2023)  Transportation Needs: No Transportation Needs (04/26/2023)  Utilities: Not At Risk (04/26/2023)  Alcohol Screen: Low Risk  (06/01/2022)  Depression (PHQ2-9): Low Risk  (06/01/2022)  Financial Resource Strain: Low Risk  (06/01/2022)  Physical Activity: Sufficiently Active (06/01/2022)  Social Connections: Socially Integrated (06/01/2022)  Stress: Stress Concern Present (06/01/2022)  Tobacco Use: Medium Risk (04/26/2023)   Readmission Risk Interventions    04/30/2023   12:25 PM  Readmission Risk Prevention Plan  Transportation Screening Complete  PCP or Specialist Appt within 3-5 Days Complete  HRI or Home Care Consult Complete  Social Work Consult for Recovery Care Planning/Counseling Complete  Palliative Care Screening Complete  Medication Review Oceanographer) Complete

## 2023-05-07 DIAGNOSIS — I5031 Acute diastolic (congestive) heart failure: Secondary | ICD-10-CM | POA: Diagnosis not present

## 2023-05-07 LAB — GLUCOSE, CAPILLARY
Glucose-Capillary: 94 mg/dL (ref 70–99)
Glucose-Capillary: 171 mg/dL — ABNORMAL HIGH (ref 70–99)
Glucose-Capillary: 416 mg/dL — ABNORMAL HIGH (ref 70–99)
Glucose-Capillary: 390 mg/dL — ABNORMAL HIGH (ref 70–99)
Glucose-Capillary: 223 mg/dL — ABNORMAL HIGH (ref 70–99)

## 2023-05-07 LAB — GLUCOSE, RANDOM: Glucose, Bld: 408 mg/dL — ABNORMAL HIGH (ref 70–99)

## 2023-05-07 NOTE — Progress Notes (Signed)
PROGRESS NOTE    Jason [name]  QIH:[id number] DOB: [birth year] DOA: 04/26/2023 PCP: [name], MD    Brief Narrative:   Jason [name] is a 82 y.o. male with past medical history significant for chronic diastolic congestive heart failure, atrial flutter on Xarelto, HLD, DM2, COPD, severe mitral stenosis with regurgitation, bladder cancer, OSA, who presented to [facility] ED on 6/11 via EMS from his PCP office due to low oxygen saturation.  Per EMS patient was oxygenating 83% on room air and complaining of shortness of breath with exertion.  Was placed on 2 L nasal cannula; and transported to the ED for further evaluation.  Denies fever, reports nonproductive cough.  In the ED, VBG with pH 7.3, P O2 less than 31.  WBC 6.9, hemoglobin 9.4, platelets 264.  D-dimer 1.53.  Sodium 131, potassium 4.5, chloride 95, CO2 29, glucose 74, BUN 14, creatinine 0.83.  AST 24, ALT 22, total bilirubin 0.8.  BNP elevated 364.1.  High sensitive troponin 31 followed by 24.  COVID/influenza/RSV PCR negative.  Chest x-ray with cardiac enlargement with small bilateral pleural effusions and perihilar/basilar infiltrates likely edema.  CT angiogram chest with no evidence of significant pulmonary embolism, cardiac enlargement, moderate bilateral pleural effusions with bilateral basilar consolidation likely pneumonia versus compressive atelectasis.  Patient was given IV diuresis.  TRH consulted for admission for further evaluation management of acute respite failure secondary to CHF exacerbation.  Assessment & Plan:   Acute respiratory failure with hypoxia Acute diastolic CHF exacerbation Patient presenting to ED with progressive shortness of breath and noted to be hypoxic at PCP office with SpO2 83% on room air.  Patient with elevated BNP, imaging studies consistent with pulmonary edema/pleural effusion, negative for pulmonary embolism.  RSV/influenza A/B/COVID PCR negative.  Patient was started on IV  furosemide and cardiology was consulted and followed during hospital course; now transition to oral furosemide. -- Net negative 500 mL past 24 hours, net  negative 18.7 L since hospitalization -- wt 134>>123.9kg -- 1500 mL fluid restriction -- Furosemide 40 mg p.o. twice daily -- Strict I's and O's and daily weights  Hx osteomyelitis first ray right foot with ulceration/necrosis of bone s/p transmetatarsal amputation Wound dehiscence Patient with recent transmetatarsal amputation of right foot for osteomyelitis of first ray, ulceration with necrosis of the bone by podiatry, Dr. [name] First forward on 03/30/2023.  Continues on antibiotics per podiatry x 14 days.  Podiatry removed staples on 04/29/2023 with slight debridement of wound. -- Continue Augmentin/doxycycline x 14 days with projected end date 05/06/2023 -- Continue daily wet-to-dry Betadine dressing changes -- Nonweightbearing RLE in cam boot -- Outpatient follow-up with podiatry as scheduled  Paroxysmal atrial flutter -- Metoprolol succinate 12.5 mg p.o. daily -- Continue Xarelto  Severe mitral stenosis/mitral regurgitation Per cardiology not a candidate for surgery.  Outpatient follow-up with [name].  Type 2 diabetes mellitus -- Semglee 40 units [place] daily -- Novolog 8 u TIDAC (if eating greater than 50% of meals) -- Moderate SSI for coverage -- CBG before every meal/at bedtime  Hyperlipidemia -- Crestor 40 mg p.o. daily  COPD Not oxygen dependent at baseline, no evidence for exacerbation.  Anemia Hemoglobin 10.2, stable.  Hyponatremia Etiology likely secondary to volume overload.  Improved with IV diuresis.  Obesity Body mass index is 30.01 kg/m. Discussed with patient needs for aggressive lifestyle changes/weight loss as this complicates all facets of care.  Outpatient follow-up with PCP.    Dementia --Delirium precautions --Get up during  the day --Encourage a familiar face to remain present throughout the  day --Keep blinds open and lights on during daylight hours --Minimize the use of opioids/benzodiazepines  Weakness/debility/deconditioning: Seen by PT/OT with recommendation of SNF placement. --TOC following, pending insurance authorization  Goals of care: Seen by palliative care during hospitalization, now DNR.  Overall long-term prognosis poor.     DVT prophylaxis: rivaroxaban (XARELTO) tablet 20 mg Start: 04/27/23 1700 SCDs Start: 04/26/23 2318 rivaroxaban (XARELTO) tablet 20 mg    Code Status: DNR Family Communication: No family present at bedside this morning  Disposition Plan:  Level of care: Med-Surg Status is: Inpatient Remains inpatient appropriate because: Medically stable for discharge to SNF once SNF bed available/receive insurance authorization; currently pending family will appeal for insurance denial    Consultants:  Cardiology Palliative care   Procedures:  Bedside debridement and staple removal by podiatry on 6/14  Antimicrobials:  Augmentin/doxycycline, continued from outpatient   Subjective: Patient seen examined bedside, resting comfortably.  Lying in bed eating breakfast.  No specific complaints this morning.  Denies headache, no dizziness, no chest pain, no palpitations, no shortness of breath, no abdominal pain, no fever/chills/night sweats, no nausea/vomiting/diarrhea, no focal weakness, no fatigue, no paresthesias.  No acute events overnight per nurse staff.  Medically stable for discharge to SNF.  Currently awaiting family appeal for insurance denial.  Objective: Vitals:   05/06/23 1423 05/06/23 2141 05/07/23 0624 05/07/23 1223  BP: (!) 131/58 107/65 (!) 115/59 110/66  Pulse: 79 78 72 70  Resp: 16 18 18 18   Temp: 97.6 F (36.4 C) 98.7 F (37.1 C) 98.1 F (36.7 C) 98.2 F (36.8 C)  TempSrc:  Oral Oral Oral  SpO2: 98% 99% 97%   Weight:      Height:        Intake/Output Summary (Last 24 hours) at 05/07/2023 1237 Last data filed at  05/07/2023 1226 Gross per 24 hour  Intake 960 ml  Output 2000 ml  Net -1040 ml   Filed Weights   05/05/23 0855 05/05/23 1709 05/06/23 0500  Weight: 122.4 kg 122.4 kg 123.9 kg    Examination:  Physical Exam: GEN: NAD, alert and oriented x 3, chronically ill in appearance HEENT: NCAT, PERRL, EOMI, sclera clear, MMM PULM: CTAB w/o wheezes/crackles, normal respiratory effort, on room air CV: RRR w/o M/G/R GI: abd soft, NTND, NABS, no R/G/M MSK: no peripheral edema, muscle strength globally intact 5/5 bilateral upper/lower extremities NEURO: CN II-XII intact, no focal deficits, sensation to light touch intact PSYCH: normal mood/affect Integumentary: right foot with dressing/ace wrap in place; as depicted below; otherwise no other concerning rashes/lesions/wounds noted exposed skin surfaces     Data Reviewed: I have personally reviewed following labs and imaging studies  CBC: Recent Labs  Lab 05/04/23 0441  WBC 5.4  NEUTROABS 3.6  HGB 10.2*  HCT 32.5*  MCV 99.1  PLT 239   Basic Metabolic Panel: Recent Labs  Lab 05/01/23 0442 05/02/23 0407 05/03/23 0417 05/04/23 0441  NA 135 140 137 135  K 4.4 4.8 3.8 4.0  CL 92* 93* 93* 94*  CO2 36* 39* 36* 33*  GLUCOSE 286* 181* 188* 248*  BUN 26* 29* 26* 26*  CREATININE 0.75 1.07 0.96 0.93  CALCIUM 8.6* 9.1 8.9 8.7*  MG 2.0 2.1 2.0 2.2   GFR: Estimated Creatinine Clearance: 94.5 mL/min (by C-G formula based on SCr of 0.93 mg/dL). Liver Function Tests: No results for input(s): "AST", "ALT", "ALKPHOS", "BILITOT", "PROT", "ALBUMIN" in the last  168 hours. No results for input(s): "LIPASE", "AMYLASE" in the last 168 hours. No results for input(s): "AMMONIA" in the last 168 hours. Coagulation Profile: No results for input(s): "INR", "PROTIME" in the last 168 hours. Cardiac Enzymes: No results for input(s): "CKTOTAL", "CKMB", "CKMBINDEX", "TROPONINI" in the last 168 hours. BNP (last 3 results) No results for input(s): "PROBNP"  in the last 8760 hours. HbA1C: No results for input(s): "HGBA1C" in the last 72 hours. CBG: Recent Labs  Lab 05/06/23 1140 05/06/23 1622 05/06/23 2139 05/07/23 0737 05/07/23 1132  GLUCAP 140* 390* 170* 94 171*   Lipid Profile: No results for input(s): "CHOL", "HDL", "LDLCALC", "TRIG", "CHOLHDL", "LDLDIRECT" in the last 72 hours. Thyroid Function Tests: No results for input(s): "TSH", "T4TOTAL", "FREET4", "T3FREE", "THYROIDAB" in the last 72 hours. Anemia Panel: No results for input(s): "VITAMINB12", "FOLATE", "FERRITIN", "TIBC", "IRON", "RETICCTPCT" in the last 72 hours. Sepsis Labs: No results for input(s): "PROCALCITON", "LATICACIDVEN" in the last 168 hours.  No results found for this or any previous visit (from the past 240 hour(s)).        Radiology Studies: No results found.      Scheduled Meds:  docusate sodium  100 mg Oral BID   furosemide  40 mg Oral BID   guaiFENesin  600 mg Oral BID   insulin aspart  0-15 Units Subcutaneous TID WC   insulin aspart  0-5 Units Subcutaneous QHS   insulin aspart  8 Units Subcutaneous TID WC   insulin glargine-yfgn  40 Units Subcutaneous Daily   metoprolol succinate  12.5 mg Oral Daily   multivitamin with minerals  1 tablet Oral Daily   polyethylene glycol  17 g Oral Daily   rivaroxaban  20 mg Oral Q supper   rosuvastatin  40 mg Oral Daily   senna  1 tablet Oral BID   sodium chloride flush  3 mL Intravenous Q12H   thiamine  100 mg Oral Daily   Continuous Infusions:  sodium chloride       LOS: 11 days    Time spent: 46 minutes spent on chart review, discussion with nursing staff, consultants, updating family and interview/physical exam; more than 50% of that time was spent in counseling and/or coordination of care.    Alvira Philips [place], DO [facility] Available via Epic secure chat 7am-7pm After these hours, please refer to coverage provider listed on [email or web address] 05/07/2023, 12:37 PM

## 2023-05-07 NOTE — Plan of Care (Signed)
  Problem: Education: Goal: Ability to describe self-care measures that may prevent or decrease complications (Diabetes Survival Skills Education) will improve Outcome: Progressing Goal: Individualized Educational Video(s) Outcome: Progressing   Problem: Coping: Goal: Ability to adjust to condition or change in health will improve Outcome: Progressing   Problem: Fluid Volume: Goal: Ability to maintain a balanced intake and output will improve Outcome: Progressing   Problem: Health Behavior/Discharge Planning: Goal: Ability to identify and utilize available resources and services will improve Outcome: Progressing Goal: Ability to manage health-related needs will improve Outcome: Progressing   Problem: Metabolic: Goal: Ability to maintain appropriate glucose levels will improve Outcome: Progressing   Problem: Nutritional: Goal: Maintenance of adequate nutrition will improve Outcome: Progressing Goal: Progress toward achieving an optimal weight will improve Outcome: Progressing   Problem: Skin Integrity: Goal: Risk for impaired skin integrity will decrease Outcome: Progressing   Problem: Tissue Perfusion: Goal: Adequacy of tissue perfusion will improve Outcome: Progressing   Problem: Education: Goal: Ability to demonstrate management of disease process will improve Outcome: Progressing Goal: Ability to verbalize understanding of medication therapies will improve Outcome: Progressing Goal: Individualized Educational Video(s) Outcome: Progressing   Problem: Activity: Goal: Capacity to carry out activities will improve Outcome: Progressing   Problem: Cardiac: Goal: Ability to achieve and maintain adequate cardiopulmonary perfusion will improve Outcome: Progressing   Problem: Education: Goal: Knowledge of General Education information will improve Description: Including pain rating scale, medication(s)/side effects and non-pharmacologic comfort measures Outcome:  Progressing   Problem: Health Behavior/Discharge Planning: Goal: Ability to manage health-related needs will improve Outcome: Progressing   Problem: Clinical Measurements: Goal: Ability to maintain clinical measurements within normal limits will improve Outcome: Progressing Goal: Will remain free from infection Outcome: Progressing Goal: Diagnostic test results will improve Outcome: Progressing Goal: Respiratory complications will improve Outcome: Progressing Goal: Cardiovascular complication will be avoided Outcome: Progressing   Problem: Activity: Goal: Risk for activity intolerance will decrease Outcome: Progressing   Problem: Nutrition: Goal: Adequate nutrition will be maintained Outcome: Progressing   Problem: Coping: Goal: Level of anxiety will decrease Outcome: Progressing   Problem: Elimination: Goal: Will not experience complications related to bowel motility Outcome: Progressing Goal: Will not experience complications related to urinary retention Outcome: Progressing   Problem: Pain Managment: Goal: General experience of comfort will improve Outcome: Progressing   Problem: Safety: Goal: Ability to remain free from injury will improve Outcome: Progressing   Problem: Skin Integrity: Goal: Risk for impaired skin integrity will decrease Outcome: Progressing   

## 2023-05-07 NOTE — Progress Notes (Signed)
The patient had a blood sugar level of 408 and was covered with Novolog 8 u sliding scale and Novolog 15 u additional. Current blood sugar recheck is 390. Informed Manuela Schwartz.

## 2023-05-07 NOTE — Plan of Care (Signed)
  Problem: Education: Goal: Ability to describe self-care measures that may prevent or decrease complications (Diabetes Survival Skills Education) will improve Outcome: Progressing   Problem: Education: Goal: Ability to demonstrate management of disease process will improve Outcome: Progressing   Problem: Activity: Goal: Capacity to carry out activities will improve Outcome: Progressing

## 2023-05-08 DIAGNOSIS — I5031 Acute diastolic (congestive) heart failure: Secondary | ICD-10-CM | POA: Diagnosis not present

## 2023-05-08 LAB — GLUCOSE, CAPILLARY
Glucose-Capillary: 91 mg/dL (ref 70–99)
Glucose-Capillary: 206 mg/dL — ABNORMAL HIGH (ref 70–99)
Glucose-Capillary: 316 mg/dL — ABNORMAL HIGH (ref 70–99)
Glucose-Capillary: 321 mg/dL — ABNORMAL HIGH (ref 70–99)
Glucose-Capillary: 289 mg/dL — ABNORMAL HIGH (ref 70–99)

## 2023-05-08 MED ORDER — INSULIN ASPART 100 UNIT/ML IJ SOLN
12.0000 [IU] | Freq: Three times a day (TID) | INTRAMUSCULAR | Status: DC
  Administered 2023-05-08: 12 [IU] via SUBCUTANEOUS

## 2023-05-08 NOTE — Progress Notes (Signed)
Occupational Therapy Treatment [name] MRN: [id number] DOB: Feb 23, 1941 Today's Date: 05/08/2023   History of present illness 82 yo male admitted with acute on chronic CHF, acute respiratory failure. 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 Other hx: CHF, Dm, mital stenosis, Aflutter bladder Ca, peripheral insufficiency, OSA, memory loss, osteomyelitis   OT comments  Patient was able to transfer to recliner in room with mod A to maintain RLE on walker sling with consistent education on WB restrictions. Patient continued to have poor standing balance, awareness of WB restrictions and safety awareness impacting participation in ADLs.  Patient's discharge plan remains appropriate at this time. OT will continue to follow acutely.     Recommendations for follow up therapy are one component of a multi-disciplinary discharge planning process, led by the attending physician.  Recommendations may be updated based on patient status, additional functional criteria and insurance authorization.    Assistance Recommended at Discharge Frequent or constant Supervision/Assistance  Patient can return home with the following  A lot of help with walking and/or transfers;A lot of help with bathing/dressing/bathroom;Assistance with cooking/housework;Direct supervision/assist for medications management;Direct supervision/assist for financial management;Assist for transportation;Help with stairs or ramp for entrance   Equipment Recommendations  None recommended by OT       Precautions / Restrictions Precautions Precautions: Fall Required Braces or Orthoses: Other Brace Other Brace: CAM boot in room Restrictions Weight Bearing Restrictions: Yes RLE Weight Bearing: Non weight bearing Other Position/Activity Restrictions: requires MAX VC's to comply with NWBing       Mobility Bed Mobility Overal bed mobility: Needs Assistance Bed  Mobility: Supine to Sit     Supine to sit: Supervision               Balance Overall balance assessment: Needs assistance Sitting-balance support: Feet supported Sitting balance-Leahy Scale: Fair           ADL either performed or assessed with clinical judgement   ADL Overall ADL's : Needs assistance/impaired     Grooming: Set up;Sitting;Wash/dry hands;Wash/dry face;Oral care;Brushing hair Grooming Details (indicate cue type and reason): sitting in recliner with education on importance of completing these tasks each day.   Upper Body Bathing Details (indicate cue type and reason): patient declined to wash up or change hospital gowns reporting that he was hungry multiple times. per meal ordering services patients food was on its way.             Toilet Transfer: Moderate assistance;Stand-pivot;Rolling walker (2 wheels) Toilet Transfer Details (indicate cue type and reason): to recliner in room with extensive cues for patient to keep RLE on the sling attached to walker to maintain NWB status. patient non compliant with keeping in place as the transfer was about to end.                  Cognition Arousal/Alertness: Awake/alert Behavior During Therapy: WFL for tasks assessed/performed Overall Cognitive Status: History of cognitive impairments - at baseline                         Pertinent Vitals/ Pain       Pain Assessment Pain Assessment: No/denies pain         Frequency  Min 1X/week        Progress Toward Goals  OT Goals(current goals can now be found in the care plan section)  Progress towards OT goals: Progressing toward goals  Plan Discharge plan remains appropriate       AM-PAC OT "6 Clicks" Daily Activity     Outcome Measure   Help from another person eating meals?: None Help from another person taking care of personal grooming?: A Little Help from another person toileting, which includes using toliet, bedpan, or urinal?:  A Lot Help from another person bathing (including washing, rinsing, drying)?: A Lot Help from another person to put on and taking off regular upper body clothing?: A Little Help from another person to put on and taking off regular lower body clothing?: A Lot 6 Click Score: 16    End of Session Equipment Utilized During Treatment: Gait belt;Rolling walker (2 wheels)  OT Visit Diagnosis: Unsteadiness on feet (R26.81);Other abnormalities of gait and mobility (R26.89);Muscle weakness (generalized) (M62.81)   Activity Tolerance Patient tolerated treatment well   Patient Left with call bell/phone within reach;in chair;with chair alarm set   Nurse Communication Mobility status        Time: 1115-1130 OT Time Calculation (min): 15 min  Charges: OT General Charges $OT Visit: 1 Visit OT Treatments $Self Care/Home Management : 8-22 mins  [name], MS Acute Rehabilitation Department Office# [phone]   Selinda Flavin 05/08/2023, 11:45 AM

## 2023-05-08 NOTE — Progress Notes (Signed)
PROGRESS NOTE    Jason [name]  UVO:[id number] DOB: September 03, 1941 DOA: 04/26/2023 PCP: [name], MD    Brief Narrative:   Jason [name] is a 82 y.o. male with past medical history significant for chronic diastolic congestive heart failure, atrial flutter on Xarelto, HLD, DM2, COPD, severe mitral stenosis with regurgitation, bladder cancer, OSA, who presented to [facility] ED on 6/11 via EMS from his PCP office due to low oxygen saturation.  Per EMS patient was oxygenating 83% on room air and complaining of shortness of breath with exertion.  Was placed on 2 L nasal cannula; and transported to the ED for further evaluation.  Denies fever, reports nonproductive cough.  In the ED, VBG with pH 7.3, P O2 less than 31.  WBC 6.9, hemoglobin 9.4, platelets 264.  D-dimer 1.53.  Sodium 131, potassium 4.5, chloride 95, CO2 29, glucose 74, BUN 14, creatinine 0.83.  AST 24, ALT 22, total bilirubin 0.8.  BNP elevated 364.1.  High sensitive troponin 31 followed by 24.  COVID/influenza/RSV PCR negative.  Chest x-ray with cardiac enlargement with small bilateral pleural effusions and perihilar/basilar infiltrates likely edema.  CT angiogram chest with no evidence of significant pulmonary embolism, cardiac enlargement, moderate bilateral pleural effusions with bilateral basilar consolidation likely pneumonia versus compressive atelectasis.  Patient was given IV diuresis.  TRH consulted for admission for further evaluation management of acute respite failure secondary to CHF exacerbation.  Assessment & Plan:   Acute respiratory failure with hypoxia Acute diastolic CHF exacerbation Patient presenting to ED with progressive shortness of breath and noted to be hypoxic at PCP office with SpO2 83% on room air.  Patient with elevated BNP, imaging studies consistent with pulmonary edema/pleural effusion, negative for pulmonary embolism.  RSV/influenza A/B/COVID PCR negative.  Patient was started on IV  furosemide and cardiology was consulted and followed during hospital course; now transition to oral furosemide. -- Net negative 1.6L past 24 hours, net  negative 20.3 L since hospitalization -- wt 134>>124kg -- 1500 mL fluid restriction -- Furosemide 40 mg p.o. twice daily -- Strict I's and O's and daily weights  Hx osteomyelitis first ray right foot with ulceration/necrosis of bone s/p transmetatarsal amputation Wound dehiscence Patient with recent transmetatarsal amputation of right foot for osteomyelitis of first ray, ulceration with necrosis of the bone by podiatry, Dr. [name] First forward on 03/30/2023.  Continues on antibiotics per podiatry x 14 days.  Podiatry removed staples on 04/29/2023 with slight debridement of wound.  Completed 14-day antibiotic course with Augmentin/doxycycline per podiatry on 05/06/2023. -- Continue daily wet-to-dry Betadine dressing changes -- Nonweightbearing RLE in cam boot -- Outpatient follow-up with podiatry as scheduled  Paroxysmal atrial flutter -- Metoprolol succinate 12.5 mg p.o. daily -- Continue Xarelto  Severe mitral stenosis/mitral regurgitation Per cardiology not a candidate for surgery.  Outpatient follow-up with [name].  Type 2 diabetes mellitus -- Semglee 40 units [place] daily -- Novolog 12 u TIDAC (if eating greater than 50% of meals) -- Moderate SSI for coverage -- CBG before every meal/at bedtime  Hyperlipidemia -- Crestor 40 mg p.o. daily  COPD Not oxygen dependent at baseline, no evidence for exacerbation.  Anemia Hemoglobin 10.2, stable.  Hyponatremia Etiology likely secondary to volume overload.  Improved with IV diuresis.  Obesity Body mass index is 30.03 kg/m. Discussed with patient needs for aggressive lifestyle changes/weight loss as this complicates all facets of care.  Outpatient follow-up with PCP.    Dementia --Delirium precautions --Get up during the  day --Encourage a familiar face to remain present throughout  the day --Keep blinds open and lights on during daylight hours --Minimize the use of opioids/benzodiazepines  Weakness/debility/deconditioning: Seen by PT/OT with recommendation of SNF placement. --TOC following, pending insurance authorization  Goals of care: Seen by palliative care during hospitalization, now DNR.  Overall long-term prognosis poor.     DVT prophylaxis: rivaroxaban (XARELTO) tablet 20 mg Start: 04/27/23 1700 SCDs Start: 04/26/23 2318 rivaroxaban (XARELTO) tablet 20 mg    Code Status: DNR Family Communication: No family present at bedside this morning  Disposition Plan:  Level of care: Med-Surg Status is: Inpatient Remains inpatient appropriate because: Medically stable for discharge to SNF once SNF bed available/receive insurance authorization; currently pending family will appeal for insurance denial    Consultants:  Cardiology Palliative care   Procedures:  Bedside debridement and staple removal by podiatry on 6/14  Antimicrobials:  Augmentin/doxycycline, continued from outpatient   Subjective: Patient seen examined bedside, resting comfortably.  Lying in bed awaiting breakfast.  Requesting orange juice.  No specific complaints this morning.  Denies headache, no dizziness, no chest pain, no palpitations, no shortness of breath, no abdominal pain, no fever/chills/night sweats, no nausea/vomiting/diarrhea, no focal weakness, no fatigue, no paresthesias.  No acute events overnight per nurse staff.  Medically stable for discharge to SNF.  Currently awaiting family appeal for insurance denial.  Objective: Vitals:   05/07/23 0624 05/07/23 1223 05/07/23 2120 05/08/23 0500  BP: (!) 115/59 110/66 115/64   Pulse: 72 70 79   Resp: 18 18 18    Temp: 98.1 F (36.7 C) 98.2 F (36.8 C) 98.4 F (36.9 C)   TempSrc: Oral Oral Oral   SpO2: 97%  97%   Weight:    124 kg  Height:        Intake/Output Summary (Last 24 hours) at 05/08/2023 1201 Last data filed at  05/08/2023 1000 Gross per 24 hour  Intake 1120 ml  Output 2700 ml  Net -1580 ml   Filed Weights   05/05/23 1709 05/06/23 0500 05/08/23 0500  Weight: 122.4 kg 123.9 kg 124 kg    Examination:  Physical Exam: GEN: NAD, alert and oriented x 3, chronically ill in appearance HEENT: NCAT, PERRL, EOMI, sclera clear, MMM PULM: CTAB w/o wheezes/crackles, normal respiratory effort, on room air CV: RRR w/o M/G/R GI: abd soft, NTND, NABS, no R/G/M MSK: no peripheral edema, muscle strength globally intact 5/5 bilateral upper/lower extremities NEURO: CN II-XII intact, no focal deficits, sensation to light touch intact PSYCH: normal mood/affect Integumentary: right foot with dressing/ace wrap in place; as depicted below; otherwise no other concerning rashes/lesions/wounds noted exposed skin surfaces     Data Reviewed: I have personally reviewed following labs and imaging studies  CBC: Recent Labs  Lab 05/04/23 0441  WBC 5.4  NEUTROABS 3.6  HGB 10.2*  HCT 32.5*  MCV 99.1  PLT 239   Basic Metabolic Panel: Recent Labs  Lab 05/02/23 0407 05/03/23 0417 05/04/23 0441 05/07/23 1707  NA 140 137 135  --   K 4.8 3.8 4.0  --   CL 93* 93* 94*  --   CO2 39* 36* 33*  --   GLUCOSE 181* 188* 248* 408*  BUN 29* 26* 26*  --   CREATININE 1.07 0.96 0.93  --   CALCIUM 9.1 8.9 8.7*  --   MG 2.1 2.0 2.2  --    GFR: Estimated Creatinine Clearance: 94.5 mL/min (by C-G formula based on SCr of 0.93 mg/dL).  Liver Function Tests: No results for input(s): "AST", "ALT", "ALKPHOS", "BILITOT", "PROT", "ALBUMIN" in the last 168 hours. No results for input(s): "LIPASE", "AMYLASE" in the last 168 hours. No results for input(s): "AMMONIA" in the last 168 hours. Coagulation Profile: No results for input(s): "INR", "PROTIME" in the last 168 hours. Cardiac Enzymes: No results for input(s): "CKTOTAL", "CKMB", "CKMBINDEX", "TROPONINI" in the last 168 hours. BNP (last 3 results) No results for input(s):  "PROBNP" in the last 8760 hours. HbA1C: No results for input(s): "HGBA1C" in the last 72 hours. CBG: Recent Labs  Lab 05/07/23 1132 05/07/23 1610 05/07/23 1928 05/07/23 2121 05/08/23 0745  GLUCAP 171* 416* 390* 223* 91   Lipid Profile: No results for input(s): "CHOL", "HDL", "LDLCALC", "TRIG", "CHOLHDL", "LDLDIRECT" in the last 72 hours. Thyroid Function Tests: No results for input(s): "TSH", "T4TOTAL", "FREET4", "T3FREE", "THYROIDAB" in the last 72 hours. Anemia Panel: No results for input(s): "VITAMINB12", "FOLATE", "FERRITIN", "TIBC", "IRON", "RETICCTPCT" in the last 72 hours. Sepsis Labs: No results for input(s): "PROCALCITON", "LATICACIDVEN" in the last 168 hours.  No results found for this or any previous visit (from the past 240 hour(s)).        Radiology Studies: No results found.      Scheduled Meds:  docusate sodium  100 mg Oral BID   furosemide  40 mg Oral BID   guaiFENesin  600 mg Oral BID   insulin aspart  0-15 Units Subcutaneous TID WC   insulin aspart  0-5 Units Subcutaneous QHS   insulin aspart  12 Units Subcutaneous TID WC   insulin glargine-yfgn  40 Units Subcutaneous Daily   metoprolol succinate  12.5 mg Oral Daily   multivitamin with minerals  1 tablet Oral Daily   polyethylene glycol  17 g Oral Daily   rivaroxaban  20 mg Oral Q supper   rosuvastatin  40 mg Oral Daily   senna  1 tablet Oral BID   sodium chloride flush  3 mL Intravenous Q12H   thiamine  100 mg Oral Daily   Continuous Infusions:  sodium chloride       LOS: 12 days    Time spent: 46 minutes spent on chart review, discussion with nursing staff, consultants, updating family and interview/physical exam; more than 50% of that time was spent in counseling and/or coordination of care.    Alvira Philips [place], DO [facility] Available via Epic secure chat 7am-7pm After these hours, please refer to coverage provider listed on [email or web address] 05/08/2023, 12:01 PM

## 2023-05-09 DIAGNOSIS — I5031 Acute diastolic (congestive) heart failure: Secondary | ICD-10-CM | POA: Diagnosis not present

## 2023-05-09 LAB — GLUCOSE, CAPILLARY
Glucose-Capillary: 67 mg/dL — ABNORMAL LOW (ref 70–99)
Glucose-Capillary: 71 mg/dL (ref 70–99)
Glucose-Capillary: 91 mg/dL (ref 70–99)
Glucose-Capillary: 135 mg/dL — ABNORMAL HIGH (ref 70–99)
Glucose-Capillary: 164 mg/dL — ABNORMAL HIGH (ref 70–99)

## 2023-05-09 MED ORDER — INSULIN ASPART 100 UNIT/ML IJ SOLN
16.0000 [IU] | Freq: Three times a day (TID) | INTRAMUSCULAR | Status: DC
  Administered 2023-05-09 – 2023-05-10 (×3): 16 [IU] via SUBCUTANEOUS

## 2023-05-09 NOTE — Progress Notes (Signed)
Hypoglycemic Event  CBG: 61  Treatment: 4 oz juice/soda  Symptoms: None  Follow-up CBG: Time:0810 CBG Result:71  Possible Reasons for Event: Inadequate meal intake (Meal tray not yet arrived for morning)  Comments/MD notified: MD made aware    Pauletta Browns

## 2023-05-09 NOTE — Progress Notes (Signed)
Physical Therapy Treatment [name] MRN: [id number] DOB: [birth year] Today's Date: 05/09/2023   History of Present Illness 82 yo male admitted with acute on chronic CHF, acute respiratory failure. 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 Other hx: CHF, Dm, mital stenosis, Aflutter bladder Ca, peripheral insufficiency, OSA, memory loss, osteomyelitis    PT Comments    Pt AxO x 3 and pleasant.  Pt in bed with CamBoot on.  Assisted OOB to amb to bathroom.  General transfer comment: VC's on NWBing and proper use of walker.  Assisted with a toilet transfer as well with VC's on safety with turns.  Practiced standing on ONE leg static stance for peri care..General Gait Details: Max VC's on NWB.  Assisted with amb to and from bathroom.  Pt declined to use knee sling so Instructed on "hopping" which pt was partially able to perfom at [facility] Assist. Pt will need ST Rehab at SNF to address mobility and functional decline prior to safely returning home.   Recommendations for follow up therapy are one component of a multi-disciplinary discharge planning process, led by the attending physician.  Recommendations may be updated based on patient status, additional functional criteria and insurance authorization.  Follow Up Recommendations  Can patient physically be transported by private vehicle: No    Assistance Recommended at Discharge Frequent or constant Supervision/Assistance  Patient can return home with the following Assist for transportation;Help with stairs or ramp for entrance;Assistance with cooking/housework;A little help with walking and/or transfers;A little help with bathing/dressing/bathroom   Equipment Recommendations  None recommended by PT    Recommendations for Other Services       Precautions / Restrictions Precautions Precautions: Fall Required Braces or Orthoses: Other Brace Other Brace: CAM boot in  room Restrictions Weight Bearing Restrictions: Yes RLE Weight Bearing: Non weight bearing Other Position/Activity Restrictions: requires MAX VC's to comply with NWBing     Mobility  Bed Mobility Overal bed mobility: Needs Assistance Bed Mobility: Supine to Sit     Supine to sit: Min guard, Supervision     General bed mobility comments: Therapist applied CAM BOOT.  Pt self able to trasfer to EOB using rail and increased time.  sitting balance is good.    Transfers Overall transfer level: Needs assistance Equipment used: Rolling walker (2 wheels) Transfers: Sit to/from Stand Sit to Stand: Min guard, Min assist           General transfer comment: VC's on NWBing and proper use of walker.  Assisted with a toilet transfer as well with VC's on safety with turns.  Practiced standing on ONE leg static stance for peri care.    Ambulation/Gait Ambulation/Gait assistance: Mod assist, Max assist Gait Distance (Feet): 18 Feet Assistive device: Rolling walker (2 wheels) Gait Pattern/deviations: Step-to pattern, Decreased stance time - right Gait velocity: decreased     General Gait Details: Max VC's on NWB.  Assisted with amb to and from bathroom.  Pt declined to use knee sling so Instructed on "hopping" which pt was partially able to perfom at [employer] Assist.   Stairs             Wheelchair Mobility    Modified Rankin (Stroke Patients Only)       Balance  Cognition Arousal/Alertness: Awake/alert Behavior During Therapy: WFL for tasks assessed/performed Overall Cognitive Status: History of cognitive impairments - at baseline                                 General Comments: AxO x 3 more agreeable to participate.        Exercises      General Comments        Pertinent Vitals/Pain Pain Assessment Pain Assessment: No/denies pain    Home Living                           Prior Function            PT Goals (current goals can now be found in the care plan section) Progress towards PT goals: Progressing toward goals    Frequency    Min 1X/week      PT Plan Current plan remains appropriate    Co-evaluation              AM-PAC PT "6 Clicks" Mobility   Outcome Measure                   End of Session Equipment Utilized During Treatment: Gait belt Activity Tolerance: Patient limited by fatigue Patient left: in chair;with call bell/phone within reach;with chair alarm set Nurse Communication: Mobility status PT Visit Diagnosis: Difficulty in walking, not elsewhere classified (R26.2)     Time: 1610-9604 PT Time Calculation (min) (ACUTE ONLY): 28 min  Charges:  $Gait Training: 8-22 mins $Therapeutic Activity: 8-22 mins                     Felecia Shelling  PTA Acute  Rehabilitation Services Office M-F          [phone]

## 2023-05-09 NOTE — Progress Notes (Signed)
PROGRESS NOTE    RIZWAN KUYPER  ZOX:[id number] DOB: [birth year] DOA: 04/26/2023 PCP: [name], MD    Brief Narrative:   Jason [name] is a 82 y.o. male with past medical history significant for chronic diastolic congestive heart failure, atrial flutter on Xarelto, HLD, DM2, COPD, severe mitral stenosis with regurgitation, bladder cancer, OSA, who presented to [facility] ED on 6/11 via EMS from his PCP office due to low oxygen saturation.  Per EMS patient was oxygenating 83% on room air and complaining of shortness of breath with exertion.  Was placed on 2 L nasal cannula; and transported to the ED for further evaluation.  Denies fever, reports nonproductive cough.  In the ED, VBG with pH 7.3, P O2 less than 31.  WBC 6.9, hemoglobin 9.4, platelets 264.  D-dimer 1.53.  Sodium 131, potassium 4.5, chloride 95, CO2 29, glucose 74, BUN 14, creatinine 0.83.  AST 24, ALT 22, total bilirubin 0.8.  BNP elevated 364.1.  High sensitive troponin 31 followed by 24.  COVID/influenza/RSV PCR negative.  Chest x-ray with cardiac enlargement with small bilateral pleural effusions and perihilar/basilar infiltrates likely edema.  CT angiogram chest with no evidence of significant pulmonary embolism, cardiac enlargement, moderate bilateral pleural effusions with bilateral basilar consolidation likely pneumonia versus compressive atelectasis.  Patient was given IV diuresis.  TRH consulted for admission for further evaluation management of acute respite failure secondary to CHF exacerbation.  Assessment & Plan:   Acute respiratory failure with hypoxia Acute diastolic CHF exacerbation Patient presenting to ED with progressive shortness of breath and noted to be hypoxic at PCP office with SpO2 83% on room air.  Patient with elevated BNP, imaging studies consistent with pulmonary edema/pleural effusion, negative for pulmonary embolism.  RSV/influenza A/B/COVID PCR negative.  Patient was started on IV  furosemide and cardiology was consulted and followed during hospital course; now transition to oral furosemide. -- Net negative 1.6L past 24 hours, net  negative 20.3 L since hospitalization -- wt 134>>124kg -- 1500 mL fluid restriction -- Furosemide 40 mg p.o. twice daily -- Strict I's and O's and daily weights  Hx osteomyelitis first ray right foot with ulceration/necrosis of bone s/p transmetatarsal amputation Wound dehiscence Patient with recent transmetatarsal amputation of right foot for osteomyelitis of first ray, ulceration with necrosis of the bone by podiatry, Dr. [name] First forward on 03/30/2023.  Continues on antibiotics per podiatry x 14 days.  Podiatry removed staples on 04/29/2023 with slight debridement of wound.  Completed 14-day antibiotic course with Augmentin/doxycycline per podiatry on 05/06/2023. -- Continue daily wet-to-dry Betadine dressing changes -- Nonweightbearing RLE in cam boot -- Outpatient follow-up with podiatry as scheduled  Paroxysmal atrial flutter -- Metoprolol succinate 12.5 mg p.o. daily -- Continue Xarelto  Severe mitral stenosis/mitral regurgitation Per cardiology not a candidate for surgery.  Outpatient follow-up with [name].  Type 2 diabetes mellitus -- Semglee 40 units [place] daily -- Novolog 16 u TIDAC (if eating greater than 50% of meals) -- Moderate SSI for coverage -- CBG before every meal/at bedtime  Hyperlipidemia -- Crestor 40 mg p.o. daily  COPD Not oxygen dependent at baseline, no evidence for exacerbation.  Anemia Hemoglobin 10.2, stable.  Hyponatremia Etiology likely secondary to volume overload.  Improved with IV diuresis.  Obesity Body mass index is 29.96 kg/m. Discussed with patient needs for aggressive lifestyle changes/weight loss as this complicates all facets of care.  Outpatient follow-up with PCP.    Dementia --Delirium precautions --Get up during the  day --Encourage a familiar face to remain present throughout  the day --Keep blinds open and lights on during daylight hours --Minimize the use of opioids/benzodiazepines  Weakness/debility/deconditioning: Seen by PT/OT with recommendation of SNF placement. --TOC following, pending insurance authorization  Goals of care: Seen by palliative care during hospitalization, now DNR.  Overall long-term prognosis poor.     DVT prophylaxis: rivaroxaban (XARELTO) tablet 20 mg Start: 04/27/23 1700 SCDs Start: 04/26/23 2318 rivaroxaban (XARELTO) tablet 20 mg    Code Status: DNR Family Communication: No family present at bedside this morning  Disposition Plan:  Level of care: Med-Surg Status is: Inpatient Remains inpatient appropriate because: Medically stable for discharge to SNF once SNF bed available/receive insurance authorization; currently pending family will appeal for insurance denial    Consultants:  Cardiology Palliative care   Procedures:  Bedside debridement and staple removal by podiatry on 6/14  Antimicrobials:  Augmentin/doxycycline, continued from outpatient   Subjective: Patient seen examined bedside, resting comfortably.  Lying in bed.  RN present.  Slightly hypoglycemic this morning blood sugars remain labile.  Will continue with current long-acting dose and increase mealtime dose as he has significant spikes of glucose during mealtime.  No specific complaints this morning.  Denies headache, no dizziness, no chest pain, no palpitations, no shortness of breath, no abdominal pain, no fever/chills/night sweats, no nausea/vomiting/diarrhea, no focal weakness, no fatigue, no paresthesias.  No acute events overnight per nurse staff.  Medically stable for discharge to SNF.  Currently awaiting family appeal for insurance denial.  Objective: Vitals:   05/08/23 1312 05/08/23 2120 05/09/23 0426 05/09/23 0855  BP: 111/62 (!) 115/56 101/66 122/66  Pulse: 68 85 67 75  Resp: 17 17 17    Temp: 97.9 F (36.6 C) 98.6 F (37 C) 98.3 F  (36.8 C)   TempSrc: Oral Oral Oral   SpO2: 100% 95% 96%   Weight:   123.7 kg   Height:        Intake/Output Summary (Last 24 hours) at 05/09/2023 1033 Last data filed at 05/09/2023 0748 Gross per 24 hour  Intake 240 ml  Output 1250 ml  Net -1010 ml   Filed Weights   05/06/23 0500 05/08/23 0500 05/09/23 0426  Weight: 123.9 kg 124 kg 123.7 kg    Examination:  Physical Exam: GEN: NAD, alert and oriented x 3, chronically ill in appearance HEENT: NCAT, PERRL, EOMI, sclera clear, MMM PULM: CTAB w/o wheezes/crackles, normal respiratory effort, on room air CV: RRR w/o M/G/R GI: abd soft, NTND, NABS, no R/G/M MSK: no peripheral edema, muscle strength globally intact 5/5 bilateral upper/lower extremities NEURO: CN II-XII intact, no focal deficits, sensation to light touch intact PSYCH: normal mood/affect Integumentary: right foot with dressing/ace wrap in place; as depicted below; otherwise no other concerning rashes/lesions/wounds noted exposed skin surfaces     Data Reviewed: I have personally reviewed following labs and imaging studies  CBC: Recent Labs  Lab 05/04/23 0441  WBC 5.4  NEUTROABS 3.6  HGB 10.2*  HCT 32.5*  MCV 99.1  PLT 239   Basic Metabolic Panel: Recent Labs  Lab 05/03/23 0417 05/04/23 0441 05/07/23 1707  NA 137 135  --   K 3.8 4.0  --   CL 93* 94*  --   CO2 36* 33*  --   GLUCOSE 188* 248* 408*  BUN 26* 26*  --   CREATININE 0.96 0.93  --   CALCIUM 8.9 8.7*  --   MG 2.0 2.2  --  GFR: Estimated Creatinine Clearance: 94.4 mL/min (by C-G formula based on SCr of 0.93 mg/dL). Liver Function Tests: No results for input(s): "AST", "ALT", "ALKPHOS", "BILITOT", "PROT", "ALBUMIN" in the last 168 hours. No results for input(s): "LIPASE", "AMYLASE" in the last 168 hours. No results for input(s): "AMMONIA" in the last 168 hours. Coagulation Profile: No results for input(s): "INR", "PROTIME" in the last 168 hours. Cardiac Enzymes: No results for  input(s): "CKTOTAL", "CKMB", "CKMBINDEX", "TROPONINI" in the last 168 hours. BNP (last 3 results) No results for input(s): "PROBNP" in the last 8760 hours. HbA1C: No results for input(s): "HGBA1C" in the last 72 hours. CBG: Recent Labs  Lab 05/08/23 1653 05/08/23 1812 05/08/23 2121 05/09/23 0741 05/09/23 0810  GLUCAP 316* 321* 289* 67* 71   Lipid Profile: No results for input(s): "CHOL", "HDL", "LDLCALC", "TRIG", "CHOLHDL", "LDLDIRECT" in the last 72 hours. Thyroid Function Tests: No results for input(s): "TSH", "T4TOTAL", "FREET4", "T3FREE", "THYROIDAB" in the last 72 hours. Anemia Panel: No results for input(s): "VITAMINB12", "FOLATE", "FERRITIN", "TIBC", "IRON", "RETICCTPCT" in the last 72 hours. Sepsis Labs: No results for input(s): "PROCALCITON", "LATICACIDVEN" in the last 168 hours.  No results found for this or any previous visit (from the past 240 hour(s)).        Radiology Studies: No results found.      Scheduled Meds:  docusate sodium  100 mg Oral BID   furosemide  40 mg Oral BID   guaiFENesin  600 mg Oral BID   insulin aspart  0-15 Units Subcutaneous TID WC   insulin aspart  0-5 Units Subcutaneous QHS   insulin aspart  16 Units Subcutaneous TID WC   insulin glargine-yfgn  40 Units Subcutaneous Daily   metoprolol succinate  12.5 mg Oral Daily   multivitamin with minerals  1 tablet Oral Daily   polyethylene glycol  17 g Oral Daily   rivaroxaban  20 mg Oral Q supper   rosuvastatin  40 mg Oral Daily   senna  1 tablet Oral BID   sodium chloride flush  3 mL Intravenous Q12H   thiamine  100 mg Oral Daily   Continuous Infusions:  sodium chloride       LOS: 13 days    Time spent: 46 minutes spent on chart review, discussion with nursing staff, consultants, updating family and interview/physical exam; more than 50% of that time was spent in counseling and/or coordination of care.    Alvira Philips [place], DO [facility] Available via Epic secure  chat 7am-7pm After these hours, please refer to coverage provider listed on [email or web address] 05/09/2023, 10:33 AM

## 2023-05-09 NOTE — TOC Progression Note (Signed)
Transition of Care [facility]) - Progression Note   Patient Details  Name: Jason [name] MRN: [id number] Date of Birth: September 25, 1941  Transition of Care [facility]) CM/SW Contact  [name], LCSW Phone Number: 05/09/2023, 12:44 PM  Clinical Narrative: Patient was approved for SNF after completing the expedited appeal. Certificate # is: [id number]. CSW followed up with [place] in admissions at [facility] and confirmed a bed will be available tomorrow. CSW left voicemail for patient's spouse regarding insurance approval for rehab. Hospitalist updated.  Expected Discharge Plan: Skilled Nursing Facility Barriers to Discharge: Continued Medical Work up  Expected Discharge Plan and Services Discharge Planning Services: CM Consult Living arrangements for the past 2 months: Single Family Home            DME Arranged: N/A DME Agency: NA HH Agency: NA  Social Determinants of Health (SDOH) Interventions SDOH Screenings   Food Insecurity: No Food Insecurity (04/26/2023)  Housing: Medium Risk (04/26/2023)  Transportation Needs: No Transportation Needs (04/26/2023)  Utilities: Not At Risk (04/26/2023)  Alcohol Screen: Low Risk  (06/01/2022)  Depression (PHQ2-9): Low Risk  (06/01/2022)  Financial Resource Strain: Low Risk  (06/01/2022)  Physical Activity: Sufficiently Active (06/01/2022)  Social Connections: Socially Integrated (06/01/2022)  Stress: Stress Concern Present (06/01/2022)  Tobacco Use: Medium Risk (04/26/2023)   Readmission Risk Interventions    04/30/2023   12:25 PM  Readmission Risk Prevention Plan  Transportation Screening Complete  PCP or Specialist Appt within 3-5 Days Complete  HRI or Home Care Consult Complete  Social Work Consult for Recovery Care Planning/Counseling Complete  Palliative Care Screening Complete  Medication Review Oceanographer) Complete

## 2023-05-10 ENCOUNTER — Telehealth: Payer: Self-pay | Admitting: Cardiology

## 2023-05-10 DIAGNOSIS — I5031 Acute diastolic (congestive) heart failure: Secondary | ICD-10-CM | POA: Diagnosis not present

## 2023-05-10 LAB — GLUCOSE, CAPILLARY
Glucose-Capillary: 81 mg/dL (ref 70–99)
Glucose-Capillary: 139 mg/dL — ABNORMAL HIGH (ref 70–99)

## 2023-05-10 MED ORDER — POLYETHYLENE GLYCOL 3350 17 G PO PACK: 17.0000 g | PACK | Freq: Every day | ORAL | 0 refills | Status: DC | PRN

## 2023-05-10 MED ORDER — INSULIN ASPART 100 UNIT/ML FLEXPEN: 16.0000 [IU] | PEN_INJECTOR | Freq: Three times a day (TID) | SUBCUTANEOUS | 11 refills | Status: DC

## 2023-05-10 MED ORDER — TOUJEO MAX SOLOSTAR 300 UNIT/ML ~~LOC~~ SOPN
40.0000 [IU] | PEN_INJECTOR | Freq: Every day | SUBCUTANEOUS | 0 refills | Status: DC
Start: 2023-05-10 — End: 2023-08-25

## 2023-05-10 MED ORDER — DOCUSATE SODIUM 100 MG PO CAPS: 100.0000 mg | ORAL_CAPSULE | Freq: Two times a day (BID) | ORAL | 0 refills | Status: DC

## 2023-05-10 MED ORDER — FUROSEMIDE 40 MG PO TABS: 40.0000 mg | ORAL_TABLET | Freq: Two times a day (BID) | ORAL | 0 refills | Status: DC

## 2023-05-10 MED ORDER — METOPROLOL SUCCINATE ER 25 MG PO TB24: 12.5000 mg | ORAL_TABLET | Freq: Every day | ORAL | 0 refills | Status: DC

## 2023-05-10 NOTE — TOC Transition Note (Signed)
Transition of Care [facility]) - CM/SW Discharge Note  Patient Details  Name: Jason [name] MRN: [id number] Date of Birth: Apr 01, 1941  Transition of Care [facility]) CM/SW Contact:  [name], LCSW Phone Number: 05/10/2023, 2:38 PM  Clinical Narrative: Patient will go to room 409P at [facility] and the number for report is [phone]. Discharge summary, discharge orders, and SNF transfer report faxed to facility in hub. PTAR scheduled; medical necessity form done. CSW notified wife of discharge and transportation being set up. LPN updated. TOC signing off.  Final next level of care: Skilled Nursing Facility Barriers to Discharge: Barriers Resolved  Discharge Placement Existing PASRR number confirmed : 04/27/23          Patient chooses bed at: [facility] Patient to be transferred to facility by: PTAR Name of family member notified: Jason [name] (spouse) Patient and family notified of of transfer: 05/10/23  Discharge Plan and Services Additional resources added to the After Visit Summary for   Discharge Planning Services: CM Consult       DME Arranged: N/A DME Agency: NA HH Agency: NA  Social Determinants of Health (SDOH) Interventions SDOH Screenings   Food Insecurity: No Food Insecurity (04/26/2023)  Housing: Medium Risk (04/26/2023)  Transportation Needs: No Transportation Needs (04/26/2023)  Utilities: Not At Risk (04/26/2023)  Alcohol Screen: Low Risk  (06/01/2022)  Depression (PHQ2-9): Low Risk  (06/01/2022)  Financial Resource Strain: Low Risk  (06/01/2022)  Physical Activity: Sufficiently Active (06/01/2022)  Social Connections: Socially Integrated (06/01/2022)  Stress: Stress Concern Present (06/01/2022)  Tobacco Use: Medium Risk (04/26/2023)   Readmission Risk Interventions    04/30/2023   12:25 PM  Readmission Risk Prevention Plan  Transportation Screening Complete  PCP or Specialist Appt within 3-5 Days Complete  HRI or Home Care Consult Complete  Social Work Consult for  Recovery Care Planning/Counseling Complete  Palliative Care Screening Complete  Medication Review Oceanographer) Complete

## 2023-05-10 NOTE — Discharge Summary (Signed)
Physician Discharge Summary  VALENTINO SAAVEDRA OZH:[id number] DOB: 03-06-1941 DOA: 04/26/2023  PCP: [name], MD  Admit date: 04/26/2023 Discharge date: 05/10/2023  Admitted From: Home Disposition: [facility]  Recommendations for Outpatient Follow-up:  Follow up with PCP in 1-2 weeks Follow-up with podiatry, Dr. Annamary Rummage Follow-up with cardiology, [name] on 05/24/2023 10:55 AM Remain nonweightbearing right lower extremity with Cam boot in place Started on furosemide 40 mg p.o. twice daily by cardiology for CHF exacerbation   Discharge Condition: Stable CODE STATUS: DNR Diet recommendation: Heart healthy/consistent carbohydrate diet; 1500 mL fluid restriction  History of present illness:  MIA WINTHROP is a 82 y.o. male with past medical history significant for chronic diastolic congestive heart failure, atrial flutter on Xarelto, HLD, DM2, COPD, severe mitral stenosis with regurgitation, bladder cancer, OSA, who presented to [facility] ED on 6/11 via EMS from his PCP office due to low oxygen saturation.  Per EMS patient was oxygenating 83% on room air and complaining of shortness of breath with exertion.  Was placed on 2 L nasal cannula; and transported to the ED for further evaluation.  Denies fever, reports nonproductive cough.   In the ED, VBG with pH 7.3, P O2 less than 31.  WBC 6.9, hemoglobin 9.4, platelets 264.  D-dimer 1.53.  Sodium 131, potassium 4.5, chloride 95, CO2 29, glucose 74, BUN 14, creatinine 0.83.  AST 24, ALT 22, total bilirubin 0.8.  BNP elevated 364.1.  High sensitive troponin 31 followed by 24.  COVID/influenza/RSV PCR negative.  Chest x-ray with cardiac enlargement with small bilateral pleural effusions and perihilar/basilar infiltrates likely edema.  CT angiogram chest with no evidence of significant pulmonary embolism, cardiac enlargement, moderate bilateral pleural effusions with bilateral basilar consolidation likely pneumonia versus  compressive atelectasis.  Patient was given IV diuresis.  TRH consulted for admission for further evaluation management of acute respite failure secondary to CHF exacerbation.  Hospital course:  Acute respiratory failure with hypoxia Acute diastolic CHF exacerbation Patient presenting to ED with progressive shortness of breath and noted to be hypoxic at PCP office with SpO2 83% on room air.  Patient with elevated BNP, imaging studies consistent with pulmonary edema/pleural effusion, negative for pulmonary embolism.  RSV/influenza A/B/COVID PCR negative.  Patient was started on IV furosemide and cardiology was consulted and followed during hospital course; now transition to oral furosemide.  Continue furosemide 40 mg p.o. twice daily.  1500 mL fluid restriction daily.  Recommend continue to monitor weights regularly, discharge weight 123.7 kg.  Outpatient follow-up with cardiology.   Hx osteomyelitis first ray right foot with ulceration/necrosis of bone s/p transmetatarsal amputation Wound dehiscence Patient with recent transmetatarsal amputation of right foot for osteomyelitis of first ray, ulceration with necrosis of the bone by podiatry, Dr. [name] First forward on 03/30/2023.  Continues on antibiotics per podiatry x 14 days.  Podiatry removed staples on 04/29/2023 with slight debridement of wound.  Completed 14-day antibiotic course with Augmentin/doxycycline per podiatry on 05/06/2023. Continue daily wet-to-dry Betadine dressing changes. Nonweightbearing RLE in cam boot. Outpatient follow-up with podiatry.   Paroxysmal atrial flutter Metoprolol succinate 12.5 mg p.o. daily. Continue Xarelto   Severe mitral stenosis/mitral regurgitation Per cardiology not a candidate for surgery.  Outpatient follow-up with [name].   Type 2 diabetes mellitus Insulin glargine 40 units [place] daily, Novolog 16 u TIDAC.  Continue to monitor blood sugars closely and adjust as needed  Hyperlipidemia Crestor 40 mg p.o.  daily, nexlizet daily   COPD Not oxygen  dependent at baseline, no evidence for exacerbation.   Anemia Hemoglobin 10.2, stable.   Hyponatremia Etiology likely secondary to volume overload.  Improved with IV diuresis.   Obesity Body mass index is 29.96 kg/m. Discussed with patient needs for aggressive lifestyle changes/weight loss as this complicates all facets of care.  Outpatient follow-up with PCP.     Dementia Minimize the use of opioids/benzodiazepines   Weakness/debility/deconditioning: Seen by PT/OT with recommendation of SNF placement.   Goals of care: Seen by palliative care during hospitalization, now DNR.  Overall long-term prognosis poor.   Discharge Diagnoses:  Principal Problem:   Acute diastolic CHF (congestive heart failure) (HCC) Active Problems:   Diabetes mellitus type 2 with atherosclerosis of arteries of extremities (HCC)   Hyperlipidemia   Atrial flutter (HCC)   Acute respiratory failure with hypoxia (HCC)   Status post amputation of right foot (HCC)   COPD (chronic obstructive pulmonary disease) (HCC)   Normocytic anemia   Hyponatremia   Elevated INR   PAF (paroxysmal atrial fibrillation) (HCC)   Moderate mitral stenosis    Discharge Instructions  Discharge Instructions     Call MD for:  difficulty breathing, headache or visual disturbances   Complete by: As directed    Call MD for:  extreme fatigue   Complete by: As directed    Call MD for:  persistant dizziness or light-headedness   Complete by: As directed    Call MD for:  persistant nausea and vomiting   Complete by: As directed    Call MD for:  severe uncontrolled pain   Complete by: As directed    Call MD for:  temperature >100.4   Complete by: As directed    Diet - low sodium heart healthy   Complete by: As directed    Discharge wound care:   Complete by: As directed    Right foot with betadine soaked gauze, kerlex and ace wrap daily   Increase activity slowly   Complete by:  As directed       Allergies as of 05/10/2023       Reactions   Bactrim [sulfamethoxazole-trimethoprim] Rash        Medication List     STOP taking these medications    amoxicillin-clavulanate 875-125 MG tablet Commonly known as: AUGMENTIN   doxycycline 100 MG tablet Commonly known as: VIBRA-TABS       TAKE these medications    acetaminophen 325 MG tablet Commonly known as: TYLENOL Take 2 tablets (650 mg total) by mouth every 6 (six) hours as needed for mild pain (or Fever >/= 101).   B-D UF III MINI PEN NEEDLES 31G X 5 MM Misc Generic drug: Insulin Pen Needle   B-D UF III MINI PEN NEEDLES 31G X 5 MM Misc Generic drug: Insulin Pen Needle USE 1 PEN NEEDLE IN THE MORNING AND AT BEDTIME   cyanocobalamin 500 MCG tablet Commonly known as: VITAMIN B12 Take 500 mcg by mouth daily.   docusate sodium 100 MG capsule Commonly known as: COLACE Take 1 capsule (100 mg total) by mouth 2 (two) times daily.   FreeStyle Libre 2 Reader [place] 1 Act by Does not apply route daily.   FreeStyle Libre 2 Sensor Misc 1 Act by Does not apply route daily. What changed:  how much to take how to take this when to take this   furosemide 40 MG tablet Commonly known as: LASIX Take 1 tablet (40 mg total) by mouth 2 (two) times daily.  Gvoke HypoPen 2-Pack 1 MG/0.2ML Soaj Generic drug: Glucagon Inject 1 Act into the skin daily as needed.   insulin aspart 100 UNIT/ML FlexPen Commonly known as: NOVOLOG Inject 16 Units into the skin 3 (three) times daily with meals. What changed: how much to take   metFORMIN 500 MG tablet Commonly known as: GLUCOPHAGE Take 2 tablets (1,000 mg total) by mouth 2 (two) times daily with a meal.   metoprolol succinate 25 MG 24 hr tablet Commonly known as: TOPROL-XL Take 0.5 tablets (12.5 mg total) by mouth daily.   Nexlizet 180-10 MG Tabs Generic drug: Bempedoic Acid-Ezetimibe Take 1 tablet by mouth daily.   polyethylene glycol 17 g  packet Commonly known as: MIRALAX / GLYCOLAX Take 17 g by mouth daily as needed for mild constipation.   rivaroxaban 20 MG Tabs tablet Commonly known as: Xarelto Take 1 tablet (20 mg total) by mouth daily.   rosuvastatin 40 MG tablet Commonly known as: CRESTOR Take 1 tablet (40 mg total) by mouth daily.   thiamine 100 MG tablet Commonly known as: Vitamin B-1 Take 100 mg by mouth daily.   Toujeo Max SoloStar 300 UNIT/ML Solostar Pen Generic drug: insulin glargine (2 Unit Dial) Inject 40 Units into the skin daily. What changed: how much to take   Vitamin D (Ergocalciferol) 1.25 MG (50000 UNIT) Caps capsule Commonly known as: DRISDOL Take 50,000 Units by mouth every Wednesday.               Discharge Care Instructions  (From admission, onward)           Start     Ordered   05/10/23 0000  Discharge wound care:       Comments: Right foot with betadine soaked gauze, kerlex and ace wrap daily   05/10/23 0930            Contact information for follow-up providers     [name], MD Follow up on 05/24/2023.   Specialty: Cardiology Why: at 10:55 AM please arrive 15 min early to check in. this will be with his PA [facility] information: [address] [place] [address] [phone]         [name], MD. Schedule an appointment as soon as possible for a visit in 1 week(s).   Specialty: Internal Medicine Contact information: [address] [place] [address] [phone]         Pilar Plate, DPM. Schedule an appointment as soon as possible for a visit.   Specialty: Actuary information: [address] [place] [address] [phone]              Contact information for after-discharge care     Destination     [facility] .   Service: Skilled Nursing Contact information: [address] Test Update Address [place]  [place] [address] [phone]                    Allergies  Allergen Reactions   Bactrim [Sulfamethoxazole-Trimethoprim] Rash    Consultations: Cardiology Palliative care Podiatry   Procedures/Studies: ECHOCARDIOGRAM COMPLETE  Result Date: 04/27/2023    ECHOCARDIOGRAM REPORT   Patient Name:   OMARRION CARMER Date of Exam: 04/27/2023 Medical Rec #:  [id number]       Height:       80.0 in Accession #:    [id number]      Weight:  293.7 lb Date of Birth:  Aug 11, 1941        BSA:          2.707 m Patient Age:    82 years        BP:           146/73 mmHg Patient Gender: M               HR:           77 bpm. Exam Location:  Inpatient Procedure: 2D Echo, Cardiac Doppler and Color Doppler Indications:    Valvular Abnormality  History:        Patient has prior history of Echocardiogram examinations, most                 recent 08/12/2021. CHF, Mitral Valve Disease and Mitral Valve                 Prolapse, Arrythmias:Atrial Flutter, Signs/Symptoms:Murmur; Risk                 Factors:Diabetes.  Sonographer:    Darlys Gales Referring Phys: [id number] ANASTASSIA DOUTOVA IMPRESSIONS  1. Left ventricular ejection fraction, by estimation, is 60 to 65%. The left ventricle has normal function. The left ventricle has no regional wall motion abnormalities. There is severe asymmetric left ventricular hypertrophy of the septal segment. Left  ventricular diastolic parameters are indeterminate. There is the interventricular septum is flattened in diastole ('D' shaped left ventricle), consistent with right ventricular volume overload.  2. Right ventricular systolic function is normal. The right ventricular size is not well visualized. There is mildly elevated pulmonary artery systolic pressure. The estimated right ventricular systolic pressure is 43.3 mmHg.  3. Left atrial size was moderately dilated.  4. MVA by continuity equation < 1.5 cm2; unable to direct planimeter valve. There is minimal forward movement,  extensive calcification and thickneing with relative sparing of the subvalvular apparatus. Wilkins score 14; not amenable to balloon intervention . The mitral valve is rheumatic. Mild mitral valve regurgitation. Severe mitral stenosis. The mean mitral valve gradient is 22.0 mmHg with average heart rate of 77 bpm.  5. The aortic valve was not well visualized. Aortic valve regurgitation is not visualized. No aortic stenosis is present.  6. The inferior vena cava is dilated in size with <50% respiratory variability, suggesting right atrial pressure of 15 mmHg. Comparison(s): Prior images reviewed side by side. Progression of mitral stenosis, mitral regurgitation is less than on prior TEE assessment. FINDINGS  Left Ventricle: Left ventricular ejection fraction, by estimation, is 60 to 65%. The left ventricle has normal function. The left ventricle has no regional wall motion abnormalities. The left ventricular internal cavity size was normal in size. There is  severe asymmetric left ventricular hypertrophy of the septal segment. The interventricular septum is flattened in diastole ('D' shaped left ventricle), consistent with right ventricular volume overload. Left ventricular diastolic parameters are indeterminate. Right Ventricle: The right ventricular size is not well visualized. No increase in right ventricular wall thickness. Right ventricular systolic function is normal. There is mildly elevated pulmonary artery systolic pressure. The tricuspid regurgitant velocity is 2.66 m/s, and with an assumed right atrial pressure of 15 mmHg, the estimated right ventricular systolic pressure is 43.3 mmHg. Left Atrium: Left atrial size was moderately dilated. Right Atrium: Right atrial size was normal in size. Pericardium: There is no evidence of pericardial effusion. Mitral Valve: MVA by continuity equation < 1.5 cm2; unable to direct planimeter valve. There is minimal  forward movement, extensive calcification and thickneing  with relative sparing of the subvalvular apparatus. Wilkins score 14; not amenable to balloon  intervention. The mitral valve is rheumatic. Mild mitral valve regurgitation. Severe mitral valve stenosis. MV peak gradient, 30.8 mmHg. The mean mitral valve gradient is 22.0 mmHg with average heart rate of 77 bpm. Tricuspid Valve: The tricuspid valve is grossly normal. Tricuspid valve regurgitation is not demonstrated. No evidence of tricuspid stenosis. Aortic Valve: The aortic valve was not well visualized. There is mild aortic valve annular calcification. Aortic valve regurgitation is not visualized. No aortic stenosis is present. Aortic valve mean gradient measures 7.0 mmHg. Aortic valve peak gradient measures 13.1 mmHg. Aortic valve area, by VTI measures 1.80 cm. Pulmonic Valve: The pulmonic valve was not well visualized. Pulmonic valve regurgitation is not visualized. No evidence of pulmonic stenosis. Aorta: The aortic root is normal in size and structure. Venous: The inferior vena cava is dilated in size with less than 50% respiratory variability, suggesting right atrial pressure of 15 mmHg. IAS/Shunts: No atrial level shunt detected by color flow Doppler.  LEFT VENTRICLE PLAX 2D LVIDd:         4.20 cm   Diastology LVIDs:         2.70 cm   LV e' medial:  3.37 cm/s LV PW:         1.30 cm   LV e' lateral: 4.90 cm/s LV IVS:        1.30 cm LVOT diam:     2.00 cm LV SV:         53 LV SV Index:   19 LVOT Area:     3.14 cm  RIGHT VENTRICLE RV S prime:     13.50 cm/s TAPSE (M-mode): 2.0 cm LEFT ATRIUM              Index        RIGHT ATRIUM           Index LA Vol (A2C):   131.0 ml 48.39 ml/m  RA Area:     21.80 cm LA Vol (A4C):   80.9 ml  29.88 ml/m  RA Volume:   67.20 ml  24.82 ml/m LA Biplane Vol: 111.0 ml 41.00 ml/m  AORTIC VALVE AV Area (Vmax):    1.53 cm AV Area (Vmean):   1.50 cm AV Area (VTI):     1.80 cm AV Vmax:           181.00 cm/s AV Vmean:          125.000 cm/s AV VTI:            0.294 m AV Peak Grad:       13.1 mmHg AV Mean Grad:      7.0 mmHg LVOT Vmax:         87.90 cm/s LVOT Vmean:        59.700 cm/s LVOT VTI:          0.168 m LVOT/AV VTI ratio: 0.57 MITRAL VALVE              TRICUSPID VALVE MV Area VTI:  0.62 cm    TR Peak grad:   28.3 mmHg MV Peak grad: 30.8 mmHg   TR Vmax:        266.00 cm/s MV Mean grad: 22.0 mmHg MV Vmax:      2.78 m/s    SHUNTS MV Vmean:     204.3 cm/s  Systemic VTI:  0.17 m  Systemic Diam: 2.00 cm Riley Lam MD Electronically signed by Riley Lam MD Signature Date/Time: 04/27/2023/4:49:25 PM    Final    DG Foot 2 Views Right  Result Date: 04/27/2023 CLINICAL DATA:  82 year old male history of osteomyelitis status post amputation. EXAM: RIGHT FOOT - 2 VIEW COMPARISON:  Right foot radiograph 03/30/2023. FINDINGS: Postoperative changes of transmetatarsal amputation are again noted. Adjacent to the resected portions of the proximal metatarsals there is a small amount of new bone formation. A small amount of lucency is noted in the lateral aspect of the resected fifth metatarsal. Gas noted in the soft tissues of the surgical stump on the prior examination has resolved, however, the soft tissues appear slightly more swollen than the prior examination. Skin staples remain in position. No acute displaced fracture or dislocation noted. Advanced degenerative changes of osteoarthritis throughout the midfoot and hindfoot. IMPRESSION: 1. Postoperative changes of transmetatarsal amputation redemonstrated with increasing soft tissue swelling, but resolution of gas in the soft tissues noted on the prior study. Some bony healing is noted along the resection, with a small amount of new bone formation. There is also a subtle lucency in the lateral aspect of the resected fifth metatarsal diaphysis, which could represent normal bony absorption, although osteomyelitis could have a similar appearance. Electronically Signed   By: Trudie Reed M.D.   On:  04/27/2023 05:28   DG Abd 1 View  Result Date: 04/26/2023 CLINICAL DATA:  Constipation. EXAM: ABDOMEN - 1 VIEW COMPARISON:  July 25, 2011 FINDINGS: The bowel gas pattern is normal. A mild stool burden is noted. Radiopaque surgical clips are seen within the right upper quadrant. No radio-opaque calculi or other significant radiographic abnormality are seen. IMPRESSION: Mild stool burden without evidence of bowel obstruction. Electronically Signed   By: Aram Candela M.D.   On: 04/26/2023 20:55   CT Angio Chest PE W and/or Wo Contrast  Result Date: 04/26/2023 CLINICAL DATA:  Hypoxia with recent surgery. Low oxygen saturation. Shortness of breath on exertion. EXAM: CT ANGIOGRAPHY CHEST WITH CONTRAST TECHNIQUE: Multidetector CT imaging of the chest was performed using the standard protocol during bolus administration of intravenous contrast. Multiplanar CT image reconstructions and MIPs were obtained to evaluate the vascular anatomy. RADIATION DOSE REDUCTION: This exam was performed according to the departmental dose-optimization program which includes automated exposure control, adjustment of the mA and/or kV according to patient size and/or use of iterative reconstruction technique. CONTRAST:  80mL OMNIPAQUE IOHEXOL 350 MG/ML SOLN COMPARISON:  Chest radiograph 04/26/2023 FINDINGS: Cardiovascular: Technically adequate study with moderately good opacification of the central and segmental pulmonary arteries. No focal filling defects are identified. No evidence of significant pulmonary embolus. Cardiac enlargement. No pericardial effusion. Normal caliber thoracic aorta. No aortic dissection. Calcification of the aorta and coronary arteries. Mediastinum/Nodes: Thyroid gland is unremarkable. Esophagus is decompressed. Mediastinal lymphadenopathy with numerous lymph nodes throughout all quadrants of the mediastinum and extending up into the supraclavicular base of neck. Largest right paratracheal nodes  measure up to about 1.7 cm short axis dimension. Etiology is nonspecific, possibly reactive or inflammatory. Lymphoproliferative process is not excluded. Clinical correlation is recommended. Lungs/Pleura: Moderate bilateral pleural effusions with bilateral basilar consolidation or atelectasis. No pneumothorax. Upper Abdomen: No acute abnormalities demonstrated in the visualized upper abdomen. Musculoskeletal: Degenerative changes in the spine. No acute bony abnormalities. Review of the MIP images confirms the above findings. IMPRESSION: 1. No evidence of significant pulmonary embolus. 2. Cardiac enlargement. 3. Aortic atherosclerosis. 4. Moderate bilateral pleural effusions with bilateral basilar  consolidation, possibly pneumonia or compressive atelectasis. Electronically Signed   By: Burman Nieves M.D.   On: 04/26/2023 18:21   DG Chest 2 View  Result Date: 04/26/2023 CLINICAL DATA:  Cough and hypoxia. Low oxygen saturation. Shortness of breath on exertion. EXAM: CHEST - 2 VIEW COMPARISON:  10/14/2016 FINDINGS: Shallow inspiration. Mild cardiac enlargement. Bilateral perihilar and basilar infiltrates with small bilateral pleural effusions, progressing since prior study. No pneumothorax. Mediastinal contours appear intact. IMPRESSION: Cardiac enlargement with small bilateral pleural effusions and perihilar/basilar infiltrates, likely edema although pneumonia could have this appearance as well. Electronically Signed   By: Burman Nieves M.D.   On: 04/26/2023 16:25     Subjective: Patient seen examined bedside, resting calmly.  Sitting on edge of bed eating breakfast.  Discharging to rehab today.  No specific questions or concerns at this time.  Denies headache, no dizziness, no chest pain, no palpitations, no shortness of breath, no abdominal pain, no fever/chills/night sweats, no nausea/vomiting/diarrhea, no fatigue, no paresthesias.  No acute events overnight per nurse staff.  Discharge Exam: Vitals:    05/09/23 2119 05/10/23 0547  BP: (!) 119/58 110/63  Pulse: 72 69  Resp: 18 18  Temp: 98.3 F (36.8 C) 97.9 F (36.6 C)  SpO2: 94% 93%   Vitals:   05/09/23 1333 05/09/23 2119 05/10/23 0547 05/10/23 0713  BP: 121/69 (!) 119/58 110/63   Pulse: 70 72 69   Resp: 17 18 18    Temp: 97.7 F (36.5 C) 98.3 F (36.8 C) 97.9 F (36.6 C)   TempSrc:      SpO2: 99% 94% 93%   Weight:    123.7 kg  Height:        Physical Exam: GEN: NAD, alert and oriented x 3, chronically ill in appearance HEENT: NCAT, PERRL, EOMI, sclera clear, MMM PULM: CTAB w/o wheezes/crackles, normal respiratory effort, on room air CV: RRR w/o M/G/R GI: abd soft, NTND, NABS, no R/G/M MSK: no peripheral edema, muscle strength globally intact 5/5 bilateral upper/lower extremities NEURO: CN II-XII intact, no focal deficits, sensation to light touch intact PSYCH: normal mood/affect Integumentary: right foot with dressing/ace wrap in place; as depicted below; otherwise no other concerning rashes/lesions/wounds noted exposed skin surfaces     The results of significant diagnostics from this hospitalization (including imaging, microbiology, ancillary and laboratory) are listed below for reference.     Microbiology: No results found for this or any previous visit (from the past 240 hour(s)).   Labs: BNP (last 3 results) Recent Labs    04/26/23 1441  BNP 364.1*   Basic Metabolic Panel: Recent Labs  Lab 05/04/23 0441 05/07/23 1707  NA 135  --   K 4.0  --   CL 94*  --   CO2 33*  --   GLUCOSE 248* 408*  BUN 26*  --   CREATININE 0.93  --   CALCIUM 8.7*  --   MG 2.2  --    Liver Function Tests: No results for input(s): "AST", "ALT", "ALKPHOS", "BILITOT", "PROT", "ALBUMIN" in the last 168 hours. No results for input(s): "LIPASE", "AMYLASE" in the last 168 hours. No results for input(s): "AMMONIA" in the last 168 hours. CBC: Recent Labs  Lab 05/04/23 0441  WBC 5.4  NEUTROABS 3.6  HGB 10.2*  HCT 32.5*   MCV 99.1  PLT 239   Cardiac Enzymes: No results for input(s): "CKTOTAL", "CKMB", "CKMBINDEX", "TROPONINI" in the last 168 hours. BNP: Invalid input(s): "POCBNP" CBG: Recent Labs  Lab 05/09/23 0810  05/09/23 1136 05/09/23 1648 05/09/23 2121 05/10/23 0748  GLUCAP 71 91 135* 164* 81   D-Dimer No results for input(s): "DDIMER" in the last 72 hours. Hgb A1c No results for input(s): "HGBA1C" in the last 72 hours. Lipid Profile No results for input(s): "CHOL", "HDL", "LDLCALC", "TRIG", "CHOLHDL", "LDLDIRECT" in the last 72 hours. Thyroid function studies No results for input(s): "TSH", "T4TOTAL", "T3FREE", "THYROIDAB" in the last 72 hours.  Invalid input(s): "FREET3" Anemia work up No results for input(s): "VITAMINB12", "FOLATE", "FERRITIN", "TIBC", "IRON", "RETICCTPCT" in the last 72 hours. Urinalysis    Component Value Date/Time   COLORURINE YELLOW 03/29/2023 1412   APPEARANCEUR CLEAR 03/29/2023 1412   LABSPEC 1.015 03/29/2023 1412   PHURINE 6.0 03/29/2023 1412   GLUCOSEU >=1000 (A) 03/29/2023 1412   HGBUR NEGATIVE 03/29/2023 1412   BILIRUBINUR NEGATIVE 03/29/2023 1412   BILIRUBINUR neg 03/21/2018 0940   KETONESUR NEGATIVE 03/29/2023 1412   PROTEINUR NEGATIVE 05/21/2020 1458   UROBILINOGEN 0.2 03/29/2023 1412   NITRITE NEGATIVE 03/29/2023 1412   LEUKOCYTESUR NEGATIVE 03/29/2023 1412   Sepsis Labs Recent Labs  Lab 05/04/23 0441  WBC 5.4   Microbiology No results found for this or any previous visit (from the past 240 hour(s)).   Time coordinating discharge: Over 30 minutes  SIGNED:   Alvira Philips [place], DO  [facility] 05/10/2023, 9:30 AM

## 2023-05-10 NOTE — Telephone Encounter (Signed)
Spoke with the patient's wife who is wondering about patient having mitral valve surgery done while he is in the hospital currently. Advised that they cannot not do that now and patient still needs to have a full work up done if they are considering repairing his mitral valve.

## 2023-05-10 NOTE — Progress Notes (Signed)
Report called to [facility]. All questions were answered. Patient is stable with no signs of distress. Patient is ready to be picked up by [facility].

## 2023-05-10 NOTE — Plan of Care (Signed)
  Problem: Education: Goal: Ability to describe self-care measures that may prevent or decrease complications (Diabetes Survival Skills Education) will improve Outcome: Progressing Goal: Individualized Educational Video(s) Outcome: Progressing   Problem: Coping: Goal: Ability to adjust to condition or change in health will improve Outcome: Progressing   Problem: Nutritional: Goal: Maintenance of adequate nutrition will improve Outcome: Progressing Goal: Progress toward achieving an optimal weight will improve Outcome: Progressing   Problem: Skin Integrity: Goal: Risk for impaired skin integrity will decrease Outcome: Progressing

## 2023-05-10 NOTE — Telephone Encounter (Signed)
Pt's spouse stated pt is supposed to have surgery and she's not sure when but since he's in the hospital now, she was wondering if he could have the procedure done while being there. She's requesting a callback to discuss further. Please advise

## 2023-05-11 DIAGNOSIS — I5031 Acute diastolic (congestive) heart failure: Secondary | ICD-10-CM

## 2023-05-11 DIAGNOSIS — J449 Chronic obstructive pulmonary disease, unspecified: Secondary | ICD-10-CM

## 2023-05-11 DIAGNOSIS — I10 Essential (primary) hypertension: Secondary | ICD-10-CM

## 2023-05-11 DIAGNOSIS — K219 Gastro-esophageal reflux disease without esophagitis: Secondary | ICD-10-CM

## 2023-05-11 DIAGNOSIS — K589 Irritable bowel syndrome without diarrhea: Secondary | ICD-10-CM

## 2023-05-11 DIAGNOSIS — E785 Hyperlipidemia, unspecified: Secondary | ICD-10-CM

## 2023-05-11 DIAGNOSIS — Z7689 Persons encountering health services in other specified circumstances: Secondary | ICD-10-CM

## 2023-05-11 DIAGNOSIS — J9601 Acute respiratory failure with hypoxia: Secondary | ICD-10-CM

## 2023-05-11 DIAGNOSIS — E1169 Type 2 diabetes mellitus with other specified complication: Secondary | ICD-10-CM

## 2023-05-11 DIAGNOSIS — D649 Anemia, unspecified: Secondary | ICD-10-CM

## 2023-05-11 DIAGNOSIS — I480 Paroxysmal atrial fibrillation: Secondary | ICD-10-CM

## 2023-05-11 NOTE — Progress Notes (Deleted)
Cardiology Office Note:    Date:  05/11/2023   ID:  Jason [name], DOB 03/06/1941, MRN [id number]  PCP:  [name], MD  Cardiologist:  [name], MD  Electrophysiologist:  None   Referring MD: [name], MD   Chief Complaint: hospital follow-up of CHF  History of Present Illness:    Jason [name] is a 82 y.o. male with a history of chronic diastolic CHF, persistent atrial flutter on Xarelto, rheumatic heart disease (mitral valve prolapse with severe mitral regurgitation and mitral stenosis), PAD with mild infrainguinal arterial disease , hyperlipidemia, type 2 diabetes mellitus with diabetic foot ulcer and osteomyelitis s/p transmetatarsal of right foot in 03/2023, COPD, obstructive sleep apnea, IBS, BPH, bladder cancer, and medication non-compliance who is followed by [name] and presents today for hospital follow-up of CHF.  Patient was referred to [name] in 2017 for further evaluation of mitral valve disease. Echo prior to visit showed normal LVEF with mild LVH and normal wall motion as well as moderate thickened mitral valve with mild to moderate stenosis and mild regurgitation. He reported having rheumatic fever at age 50. His mitral valve disease has been monitored closely over the years and has progressed. Myoview in 10/2020 showed no evidence of ischemia. He was noted to be in atrial flutter at an office visit in 12/2020 and a cardiac monitor after this showed continuous atrial flutter (100% burden). Heart rate was well controlled and there has been concerns for medication compliance over the years so a rate control strategy has been pursued. TEE in 07/2021 showed LVEF of 60-65% with severely calcified anterior and posterior leaflets of the mitral valve with severe mitral stenosis and severe mitral regurgitation (mean gradient 12 mmHg). Findings consistent with rheumatic heart disease. He was not responsive to any of our attempts to get in contact with him after  this and did not follow-up with [name] until 10/2022. At that time, he was non-compliant with all of his cardiac medications and was not walking. [name] had a long discussion with patient about potential valve surgery and patient was informed that he would have to compliant with medications and follow-ups. He was referred to an Transport planner and plan was to follow-up in a couple of months to see if he would be a candidate for surgery if he could demonstrate compliance. He has since decided that he would not want surgery.  He was admitted in 03/2023 for sepsis secondary to a right diabetic foot ulcer and osteomyelitis ultimately requiring a transmetatarsal amputation on 03/30/2023. ABIs during that admission showed non-compressible lower extremities arteries bilaterally.   He was then admitted from 04/26/2023 to 05/10/2023 for acute CHF after being found to be hypoxic with O2 sat of 83% on room air at his PCPs office. Echo showed LVEF of 60-65% with severe asymmetric LVH of the septal segment, normal RV with mildly elevated PASP and interventricular septum flattening in diastole consistent with RV overload, and a rheumatic mitral valve with severe mitral stenosis and mild mitral regurgitation. Cardiology consulted and he was not felt to be a candidate for valve surgery. He was diuresed with IV Lasix and then transitioned to PO Lasix.   Patient presents today for follow-up. ***  Chronic Diastolic CHF Patient was recently admitted in 04/2023 for acute CHF likely due to severe mitral stenosis and medication non-compliance. Echo showed LVEF of LVEF of 60-65% with severe asymmetric LVH of the septal segment, normal RV with mildly elevated  PASP and interventricular septum flattening in diastole consistent with RV overload, and a rheumatic mitral valve with severe mitral stenosis and mild mitral regurgitation.  - *** - Continue Lasix 40mg  twice daily.  - Continue Toprol-XL 12.5mg  daily. - Discussed  importance of daily weights and sodium/ fluid restrictions.   Rheumatic Mitral Valve Disease Severe Mitral Stenosis TEE in 07/2021 showed severely calcified anterior and posterior leaflets of the mitral valve with severe mitral stenosis and severe mitral regurgitation (mean gradient 12 mmHg). Findings consistent with rheumatic heart disease. TTE in 04/2023 during recent admission showed severe mitral stenosis but only mild mitral regurgitation.  - Not a candidate for surgery given noncompliance. Patient also has said that he would not want surgery. Continue medical therapy with diuresis and beta-blocker to increase diastole.   Permanent Atrial Flutter Monitor in 01/2021 showed 100% atrial flutter burden. Focus has been on rate control given history of non-compliance.  - Rate controlled. *** - Continue Toprol-XL 12.5mg  daily. - Continue Xarelto 20mg  daily.  PAD ABIs in 03/2023 showed non-compressible lower extremities arteries bilaterally. Per last Vascular Surgery note in 11/2022, "patient has evidence of mild infrainguinal arterial occlusive disease." - Continue Xarelto and statin.   Hyperlipidemia Lipid panel in 03/2023: Total Cholesterol 188, Triglycerides 118, HDL 50, LDL 114. LDL goal <70 given CAD.  - Continue Crestor 40mg  daily.  - ***  Type 2 Diabetes Mellitus Hemoglobin A1c 5.8% in 04/2023.  - On Metformin and Insulin.  - Management per PCP.  EKGs/Labs/Other Studies Reviewed:    The following studies were reviewed:  Myoview 10/22/2020: Nuclear stress EF: 73%. The left ventricular ejection fraction is hyperdynamic (>65%). There was no ST segment deviation noted during stress. The study is normal. This is a low risk study. _______________  Monitor 12/2020: 1 run of Ventricular Tachycardia occurred lasting 8 beats with a max rate of 146 bpm (avg 139 bpm). Atrial Fibrillation/Flutter occurred continuously (100% burden), ranging from 35-116 bpm (avg of 66 bpm). Isolated VEs were  rare (<1.0%), VE Couplets were rare (<1.0%), and no VE Triplets were present. Ventricular Bigeminy and Trigeminy were present.   Atrial flutter with variable conduction.   Occasional 2:1 conduction only briefly. Rare ventricular ectopy.   _______________  TEE 08/12/2021: Impressions: 1. The mitral valve appears rheumatic with severely calcified anterior  and posterior leaflets. The leaflets demonstrate restricted movement in  systole/diastole. There is mixed severe mitral stenosis and severe mitral  regurgitation. The MG is 12 mmHG @  75 bpm. PHT 145 ms which equates to MVA 1.52 cm2. Direct 3D planimetry  shows MVA 1.40-1.50 cm2, consistent with severe mitral stenosis. There is  also severe, eccentric mitral regurgitation. This is due to severely  restricted movement of the posterior MV  leaflet in systole/diastole (IIIA). Regarding MR, 2D PISA 0.9 cm2, 2D ERO  0.37 cm2, R vol 54 cc. 3D VCA 0.35 cm2. There is systolic reversal of flow  in the left upper and left lower pulmonary veins. All consistent with  severe MR. Overall, there is  combined severe mitral stenosis and severe mitral regurgitation related to  rheumatic mitral valve disease with severe leaflet calcificaiton. The  mitral valve is rheumatic. Severe mitral valve regurgitation. Severe  mitral stenosis.   2. Left ventricular ejection fraction, by estimation, is 60 to 65%. The  left ventricle has normal function.   3. Right ventricular systolic function is normal. The right ventricular  size is normal.   4. Left atrial size was severely dilated. No left  atrial/left atrial  appendage thrombus was detected. The LAA emptying velocity was 55 cm/s.   5. Right atrial size was mildly dilated.   6. The aortic valve is tricuspid. There is mild calcification of the  aortic valve. Aortic valve regurgitation is not visualized.   7. There is mild (Grade II) layered plaque involving the descending  aorta.   8. Agitated saline contrast  bubble study was positive with shunting  observed after >6 cardiac cycles suggestive of intrapulmonary shunting.  _______________  ABIs/ TBIs 03/29/2023: Summary: Right: Resting right ankle-brachial index indicates noncompressible right  lower extremity arteries. Unable to obtain TBI due to great toe amputation.   Left: Resting left ankle-brachial index indicates noncompressible left  lower extremity arteries. The left toe-brachial index is abnormal.  _______________  TTE 04/27/2023: Impressions: 1. Left ventricular ejection fraction, by estimation, is 60 to 65%. The  left ventricle has normal function. The left ventricle has no regional  wall motion abnormalities. There is severe asymmetric left ventricular  hypertrophy of the septal segment. Left   ventricular diastolic parameters are indeterminate. There is the  interventricular septum is flattened in diastole ('D' shaped left  ventricle), consistent with right ventricular volume overload.   2. Right ventricular systolic function is normal. The right ventricular  size is not well visualized. There is mildly elevated pulmonary artery  systolic pressure. The estimated right ventricular systolic pressure is  43.3 mmHg.   3. Left atrial size was moderately dilated.   4. MVA by continuity equation < 1.5 cm2; unable to direct planimeter  valve. There is minimal forward movement, extensive calcification and  thickneing with relative sparing of the subvalvular apparatus. Wilkins  score 14; not amenable to balloon  intervention . The mitral valve is rheumatic. Mild mitral valve  regurgitation. Severe mitral stenosis. The mean mitral valve gradient is  22.0 mmHg with average heart rate of 77 bpm.   5. The aortic valve was not well visualized. Aortic valve regurgitation  is not visualized. No aortic stenosis is present.   6. The inferior vena cava is dilated in size with <50% respiratory  variability, suggesting right atrial pressure of 15  mmHg.   Comparison(s): Prior images reviewed side by side. Progression of mitral  stenosis, mitral regurgitation is less than on prior TEE assessment.    EKG:  EKG not ordered today.   Recent Labs: 04/26/2023: B Natriuretic Peptide 364.1; TSH 0.771 04/27/2023: ALT 24 05/04/2023: BUN 26; Creatinine, Ser 0.93; Hemoglobin 10.2; Magnesium 2.2; Platelets 239; Potassium 4.0; Sodium 135  Recent Lipid Panel    Component Value Date/Time   CHOL 188 03/29/2023 1412   TRIG 118.0 03/29/2023 1412   HDL 50.40 03/29/2023 1412   CHOLHDL 4 03/29/2023 1412   VLDL 23.6 03/29/2023 1412   LDLCALC 114 (H) 03/29/2023 1412   LDLCALC 79 05/21/2020 1458    Physical Exam:    Vital Signs: There were no vitals taken for this visit.    Wt Readings from Last 3 Encounters:  05/10/23 272 lb 11.3 oz (123.7 kg)  03/30/23 259 lb (117.5 kg)  03/29/23 260 lb (117.9 kg)     General: 82 y.o. male in no acute distress. HEENT: Normocephalic and atraumatic. Sclera clear. EOMs intact. Neck: Supple. No carotid bruits. No JVD. Heart: *** RRR. Distinct S1 and S2. No murmurs, gallops, or rubs. Radial and distal pedal pulses 2+ and equal bilaterally. Lungs: No increased work of breathing. Clear to ausculation bilaterally. No wheezes, rhonchi, or rales.  Abdomen: Soft,  non-distended, and non-tender to palpation. Bowel sounds present in all 4 quadrants.  MSK: Normal strength and tone for age. *** Extremities: No lower extremity edema.    Skin: Warm and dry. Neuro: Alert and oriented x3. No focal deficits. Psych: Normal affect. Responds appropriately.   Assessment:    No diagnosis found.  Plan:     Disposition: Follow up in ***   Medication Adjustments/Labs and Tests Ordered: Current medicines are reviewed at length with the patient today.  Concerns regarding medicines are outlined above.  No orders of the defined types were placed in this encounter.  No orders of the defined types were placed in this  encounter.   There are no Patient Instructions on file for this visit.   Signed, Corrin Parker, PA-C  05/11/2023 1:09 PM    [facility]

## 2023-05-12 NOTE — Progress Notes (Signed)
Cardiology Office Note:    Date:  05/24/2023   ID:  [name], DOB 07-Jun-1941, MRN [id number]  PCP:  [name], MD  Cardiologist:  [name], MD  Electrophysiologist:  None   Referring MD: [name], MD   Chief Complaint: hospital follow-up of CHF  History of Present Illness:    Jason [name] is a 82 y.o. male with a history of chronic diastolic CHF, persistent atrial flutter on Xarelto, rheumatic heart disease (mitral valve prolapse with severe mitral stenosis and mild mitral regurgitation), PAD with mild infrainguinal arterial disease , hyperlipidemia, type 2 diabetes mellitus with diabetic foot ulcer and osteomyelitis s/p transmetatarsal amputation of right foot in 03/2023, COPD, obstructive sleep apnea, IBS, BPH, bladder cancer, and medication non-compliance who is followed by [name] and presents today for hospital follow-up of CHF.  Patient was referred to [name] in 2017 for further evaluation of mitral valve disease. Echo prior to visit showed normal LVEF with mild LVH and normal wall motion as well as moderate thickened mitral valve with mild to moderate stenosis and mild regurgitation. He reported having rheumatic fever at age 20. His mitral valve disease has been monitored closely over the years and has progressed. Myoview in 10/2020 showed no evidence of ischemia. He was noted to be in atrial flutter at an office visit in 12/2020 and a cardiac monitor after this showed continuous atrial flutter (100% burden). Heart rate was well controlled and there has been concerns for medication compliance over the years so a rate control strategy has been pursued. TEE in 07/2021 showed LVEF of 60-65% with severely calcified anterior and posterior leaflets of the mitral valve with severe mitral stenosis and severe mitral regurgitation (mean gradient 12 mmHg). Findings consistent with rheumatic heart disease. He was not responsive to any of our attempts to get in contact  with him after this and did not follow-up with [name] until 10/2022. At that time, he was non-compliant with all of his cardiac medications and was not walking. [name] had a long discussion with patient about potential valve surgery and patient was informed that he would have to compliant with medications and follow-ups. He was referred to an Transport planner and plan was to follow-up in a couple of months to see if he would be a candidate for surgery if he could demonstrate compliance. He has since decided that he would not want surgery.  He was admitted in 03/2023 for sepsis secondary to a right diabetic foot ulcer and osteomyelitis ultimately requiring a transmetatarsal amputation on 03/30/2023. ABIs during that admission showed non-compressible lower extremities arteries bilaterally.   He was then admitted from 04/26/2023 to 05/10/2023 for acute CHF after being found to be hypoxic with O2 sat of 83% on room air at his PCPs office. Echo showed LVEF of 60-65% with severe asymmetric LVH of the septal segment, normal RV with mildly elevated PASP and interventricular septum flattening in diastole consistent with RV overload, and a rheumatic mitral valve with severe mitral stenosis and mild mitral regurgitation. Cardiology consulted and he was not felt to be a candidate for valve surgery. He was diuresed with IV Lasix and then transitioned to PO Lasix.   Patient presents today for follow-up. Here alone. He is currently at [facility] but is hoping to return home. He is doing okay from a cardiac standpoint since discharge and has no cardiac complaints. He denies any chest pain, shortness of breath, orthopnea, PND, palpitations, lightheadedness,  dizziness, or syncope. He is still has a walking boot on his right foot. He has some mild edema of his left lower extremity which he is states is normal for him. He has no questions or concerns today.  EKGs/Labs/Other Studies Reviewed:    The following studies  were reviewed today:  Myoview 10/22/2020: Nuclear stress EF: 73%. The left ventricular ejection fraction is hyperdynamic (>65%). There was no ST segment deviation noted during stress. The study is normal. This is a low risk study. _______________  Monitor 12/2020: 1 run of Ventricular Tachycardia occurred lasting 8 beats with a max rate of 146 bpm (avg 139 bpm). Atrial Fibrillation/Flutter occurred continuously (100% burden), ranging from 35-116 bpm (avg of 66 bpm). Isolated VEs were rare (<1.0%), VE Couplets were rare (<1.0%), and no VE Triplets were present. Ventricular Bigeminy and Trigeminy were present.   Atrial flutter with variable conduction.   Occasional 2:1 conduction only briefly. Rare ventricular ectopy.   _______________  TEE 08/12/2021: Impressions: 1. The mitral valve appears rheumatic with severely calcified anterior  and posterior leaflets. The leaflets demonstrate restricted movement in  systole/diastole. There is mixed severe mitral stenosis and severe mitral  regurgitation. The MG is 12 mmHG @  75 bpm. PHT 145 ms which equates to MVA 1.52 cm2. Direct 3D planimetry  shows MVA 1.40-1.50 cm2, consistent with severe mitral stenosis. There is  also severe, eccentric mitral regurgitation. This is due to severely  restricted movement of the posterior MV  leaflet in systole/diastole (IIIA). Regarding MR, 2D PISA 0.9 cm2, 2D ERO  0.37 cm2, R vol 54 cc. 3D VCA 0.35 cm2. There is systolic reversal of flow  in the left upper and left lower pulmonary veins. All consistent with  severe MR. Overall, there is  combined severe mitral stenosis and severe mitral regurgitation related to  rheumatic mitral valve disease with severe leaflet calcificaiton. The  mitral valve is rheumatic. Severe mitral valve regurgitation. Severe  mitral stenosis.   2. Left ventricular ejection fraction, by estimation, is 60 to 65%. The  left ventricle has normal function.   3. Right ventricular  systolic function is normal. The right ventricular  size is normal.   4. Left atrial size was severely dilated. No left atrial/left atrial  appendage thrombus was detected. The LAA emptying velocity was 55 cm/s.   5. Right atrial size was mildly dilated.   6. The aortic valve is tricuspid. There is mild calcification of the  aortic valve. Aortic valve regurgitation is not visualized.   7. There is mild (Grade II) layered plaque involving the descending  aorta.   8. Agitated saline contrast bubble study was positive with shunting  observed after >6 cardiac cycles suggestive of intrapulmonary shunting.  _______________  ABIs/ TBIs 03/29/2023: Summary: Right: Resting right ankle-brachial index indicates noncompressible right  lower extremity arteries. Unable to obtain TBI due to great toe amputation.   Left: Resting left ankle-brachial index indicates noncompressible left  lower extremity arteries. The left toe-brachial index is abnormal.  _______________  TTE 04/27/2023: Impressions: 1. Left ventricular ejection fraction, by estimation, is 60 to 65%. The  left ventricle has normal function. The left ventricle has no regional  wall motion abnormalities. There is severe asymmetric left ventricular  hypertrophy of the septal segment. Left   ventricular diastolic parameters are indeterminate. There is the  interventricular septum is flattened in diastole ('D' shaped left  ventricle), consistent with right ventricular volume overload.   2. Right ventricular systolic function is normal.  The right ventricular  size is not well visualized. There is mildly elevated pulmonary artery  systolic pressure. The estimated right ventricular systolic pressure is  43.3 mmHg.   3. Left atrial size was moderately dilated.   4. MVA by continuity equation < 1.5 cm2; unable to direct planimeter  valve. There is minimal forward movement, extensive calcification and  thickneing with relative sparing of the  subvalvular apparatus. Wilkins  score 14; not amenable to balloon  intervention . The mitral valve is rheumatic. Mild mitral valve  regurgitation. Severe mitral stenosis. The mean mitral valve gradient is  22.0 mmHg with average heart rate of 77 bpm.   5. The aortic valve was not well visualized. Aortic valve regurgitation  is not visualized. No aortic stenosis is present.   6. The inferior vena cava is dilated in size with <50% respiratory  variability, suggesting right atrial pressure of 15 mmHg.   Comparison(s): Prior images reviewed side by side. Progression of mitral  stenosis, mitral regurgitation is less than on prior TEE assessment.   EKG:  EKG not ordered today.   Recent Labs: 04/26/2023: B Natriuretic Peptide 364.1; TSH 0.771 04/27/2023: ALT 24 05/04/2023: Magnesium 2.2 05/18/2023: BUN 20; Creatinine, Ser 1.18; Hemoglobin 12.2; Platelets 217; Potassium 3.7; Sodium 139  Recent Lipid Panel    Component Value Date/Time   CHOL 188 03/29/2023 1412   TRIG 118.0 03/29/2023 1412   HDL 50.40 03/29/2023 1412   CHOLHDL 4 03/29/2023 1412   VLDL 23.6 03/29/2023 1412   LDLCALC 114 (H) 03/29/2023 1412   LDLCALC 79 05/21/2020 1458    Physical Exam:    Vital Signs: BP (!) 98/58   Pulse 67   Ht 6\' 8"  (2.032 m)   Wt 274 lb (124.3 kg)   SpO2 96%   BMI 30.10 kg/m     Wt Readings from Last 3 Encounters:  05/24/23 274 lb (124.3 kg)  05/18/23 272 lb (123.4 kg)  05/10/23 272 lb 11.3 oz (123.7 kg)     General: 82 y.o. African-American male in no acute distress. HEENT: Normocephalic and atraumatic. Sclera clear.  Neck: Supple. No carotid bruits. No JVD. Heart: RRR. Distinct S1 and S2. II/VI systolic murmur heard best at apex. No gallops or rubs.  Lungs: No increased work of breathing. Clear to ausculation bilaterally. No wheezes, rhonchi, or rales.  Abdomen: Soft, non-distended, and non-tender to palpation.  Extremities: CAM walking boot on right lower extremities. Mild (trace to 1+)  pitting edema of left lower extremity. Skin: Warm and dry. Neuro: Alert and oriented x3. No focal deficits. Psych: Normal affect. Responds appropriately.  Assessment:    1. Chronic diastolic CHF (congestive heart failure) (HCC)   2. Rheumatic mitral valve disease   3. Severe mitral valve stenosis   4. Atrial flutter, unspecified type (HCC)   5. PAD (peripheral artery disease) (HCC)   6. Hyperlipidemia, unspecified hyperlipidemia type   7. Type 2 diabetes mellitus without obesity (HCC)   8. Osteomyelitis s/p transmetatarsal amputation of right foot     Plan:    Chronic Diastolic CHF Patient was recently admitted in 04/2023 for acute CHF likely due to severe mitral stenosis and medication non-compliance. Echo showed LVEF of LVEF of 60-65% with severe asymmetric LVH of the septal segment, normal RV with mildly elevated PASP and interventricular septum flattening in diastole consistent with RV overload, and a rheumatic mitral valve with severe mitral stenosis and mild mitral regurgitation.  - Mild lower extremity edema on exam but otherwise euvolemic.  Weight stable from recent discharge. - Continue Lasix 40mg  twice daily.  - Continue Toprol-XL 12.5mg  daily. - Renal function and potassium stable on recent labs on 05/18/2023.  Rheumatic Mitral Valve Disease Severe Mitral Stenosis TEE in 07/2021 showed severely calcified anterior and posterior leaflets of the mitral valve with severe mitral stenosis and severe mitral regurgitation (mean gradient 12 mmHg). Findings consistent with rheumatic heart disease. TTE in 04/2023 during recent admission showed severe mitral stenosis but only mild mitral regurgitation.  - Not a candidate for surgery given noncompliance. Patient also has said that he would not want surgery. Continue medical therapy with diuresis and beta-blocker to increase diastole.   Permanent Atrial Flutter Monitor in 01/2021 showed 100% atrial flutter burden. Focus has been on rate control  given history of non-compliance.  - Rate controlled.  - Continue Toprol-XL 12.5mg  daily. - Continue Xarelto 20mg  daily.  PAD ABIs in 03/2023 showed non-compressible lower extremities arteries bilaterally. Per last Vascular Surgery note in 11/2022, "patient has evidence of mild infrainguinal arterial occlusive disease." - Continue Xarelto and statin.   Hyperlipidemia Lipid panel in 03/2023: Total Cholesterol 188, Triglycerides 118, HDL 50, LDL 114. LDL goal <70 given CAD.  - Continue Crestor 40mg  daily and Nexlizet 180-10mg  daily.  - He has had issues with medication compliance in the past. Currently taking medications because he is at a SNF and he takes what they given him. Can repeat lipid panel at follow-up visit if still compliant with medications.   Type 2 Diabetes Mellitus Hemoglobin A1c 5.8% in 04/2023.  - On Metformin and Insulin.  - Management per PCP.  Osteomyelitis s/p Transmetatarsal Amputation Recently admitted in 03/2023 for sepsis secondary to right diabetic foot ulcer and osteomyelitis ultiately requiring transmetatarsal amputation.  - Currently residing at [facility] following amputation. Still in walking boot.   - Management per Podiatry.  Disposition: Follow up in 3 months.    Medication Adjustments/Labs and Tests Ordered: Current medicines are reviewed at length with the patient today.  Concerns regarding medicines are outlined above.  No orders of the defined types were placed in this encounter.  No orders of the defined types were placed in this encounter.   Patient Instructions  Medication Instructions:   No chnages  *If you need a refill on your cardiac medications before your next appointment, please call your pharmacy*   Lab Work:  No needed     Testing/Procedures: Not needed  Follow-Up: At [facility], you and your health needs are our priority.  As part of our continuing mission to provide you with exceptional heart care, we have  created designated Provider Care Teams.  These Care Teams include your primary Cardiologist (physician) and Advanced Practice Providers (APPs -  Physician Assistants and Nurse Practitioners) who all work together to provide you with the care you need, when you need it.  We recommend signing up for the patient portal called "MyChart".  Sign up information is provided on this After Visit Summary.  MyChart is used to connect with patients for Virtual Visits (Telemedicine).  Patients are able to view lab/test results, encounter notes, upcoming appointments, etc.  Non-urgent messages can be sent to your provider as well.   To learn more about what you can do with MyChart, go to [email or web address].    Your next appointment:   3 month(s)  The format for your next appointment:   In Person  Provider:   [name], MD      Signed, Corrin Parker, PA-C  05/24/2023 10:01 AM    [facility]

## 2023-05-16 DIAGNOSIS — M6281 Muscle weakness (generalized): Secondary | ICD-10-CM

## 2023-05-16 DIAGNOSIS — Z8631 Personal history of diabetic foot ulcer: Secondary | ICD-10-CM

## 2023-05-16 DIAGNOSIS — D649 Anemia, unspecified: Secondary | ICD-10-CM

## 2023-05-16 DIAGNOSIS — K219 Gastro-esophageal reflux disease without esophagitis: Secondary | ICD-10-CM

## 2023-05-16 DIAGNOSIS — I10 Essential (primary) hypertension: Secondary | ICD-10-CM

## 2023-05-16 DIAGNOSIS — I480 Paroxysmal atrial fibrillation: Secondary | ICD-10-CM | POA: Diagnosis not present

## 2023-05-16 DIAGNOSIS — J449 Chronic obstructive pulmonary disease, unspecified: Secondary | ICD-10-CM

## 2023-05-16 DIAGNOSIS — R2689 Other abnormalities of gait and mobility: Secondary | ICD-10-CM

## 2023-05-16 DIAGNOSIS — J9601 Acute respiratory failure with hypoxia: Secondary | ICD-10-CM | POA: Diagnosis not present

## 2023-05-16 DIAGNOSIS — E785 Hyperlipidemia, unspecified: Secondary | ICD-10-CM

## 2023-05-16 DIAGNOSIS — I5031 Acute diastolic (congestive) heart failure: Secondary | ICD-10-CM | POA: Diagnosis not present

## 2023-05-16 DIAGNOSIS — E1169 Type 2 diabetes mellitus with other specified complication: Secondary | ICD-10-CM | POA: Diagnosis not present

## 2023-05-18 ENCOUNTER — Encounter (HOSPITAL_COMMUNITY): Payer: Self-pay

## 2023-05-18 ENCOUNTER — Emergency Department (HOSPITAL_COMMUNITY): Payer: Medicare HMO

## 2023-05-18 ENCOUNTER — Other Ambulatory Visit: Payer: Self-pay

## 2023-05-18 ENCOUNTER — Emergency Department (HOSPITAL_COMMUNITY)
Admission: EM | Admit: 2023-05-18 | Discharge: 2023-05-18 | Disposition: A | Discharge status: Home / Self Care | Payer: Medicare HMO | Attending: Emergency Medicine | Admitting: Emergency Medicine

## 2023-05-18 DIAGNOSIS — E1165 Type 2 diabetes mellitus with hyperglycemia: Secondary | ICD-10-CM | POA: Diagnosis not present

## 2023-05-18 DIAGNOSIS — R2689 Other abnormalities of gait and mobility: Secondary | ICD-10-CM | POA: Diagnosis not present

## 2023-05-18 DIAGNOSIS — Z794 Long term (current) use of insulin: Secondary | ICD-10-CM | POA: Insufficient documentation

## 2023-05-18 DIAGNOSIS — Z4801 Encounter for change or removal of surgical wound dressing: Secondary | ICD-10-CM | POA: Insufficient documentation

## 2023-05-18 DIAGNOSIS — F0390 Unspecified dementia without behavioral disturbance: Secondary | ICD-10-CM | POA: Insufficient documentation

## 2023-05-18 DIAGNOSIS — J449 Chronic obstructive pulmonary disease, unspecified: Secondary | ICD-10-CM | POA: Diagnosis not present

## 2023-05-18 DIAGNOSIS — Z5189 Encounter for other specified aftercare: Secondary | ICD-10-CM

## 2023-05-18 DIAGNOSIS — S91309D Unspecified open wound, unspecified foot, subsequent encounter: Secondary | ICD-10-CM | POA: Diagnosis not present

## 2023-05-18 DIAGNOSIS — Z7984 Long term (current) use of oral hypoglycemic drugs: Secondary | ICD-10-CM | POA: Diagnosis not present

## 2023-05-18 DIAGNOSIS — M6281 Muscle weakness (generalized): Secondary | ICD-10-CM | POA: Diagnosis not present

## 2023-05-18 DIAGNOSIS — E1169 Type 2 diabetes mellitus with other specified complication: Secondary | ICD-10-CM | POA: Diagnosis not present

## 2023-05-18 LAB — BASIC METABOLIC PANEL
Anion gap: 11 (ref 5–15)
BUN: 20 mg/dL (ref 8–23)
CO2: 31 mmol/L (ref 22–32)
Calcium: 9.3 mg/dL (ref 8.9–10.3)
Chloride: 97 mmol/L — ABNORMAL LOW (ref 98–111)
Creatinine, Ser: 1.18 mg/dL (ref 0.61–1.24)
GFR, Estimated: >60 mL/min (ref >60)
Glucose, Bld: 167 mg/dL — ABNORMAL HIGH (ref 70–99)
Potassium: 3.7 mmol/L (ref 3.5–5.1)
Sodium: 139 mmol/L (ref 135–145)

## 2023-05-18 LAB — CBC WITH DIFFERENTIAL/PLATELET
Abs Immature Granulocytes: 0.01 10*3/uL (ref 0.00–0.07)
Basophils Absolute: 0.1 10*3/uL (ref 0.0–0.1)
Basophils Relative: 1 %
Eosinophils Absolute: 0.4 10*3/uL (ref 0.0–0.5)
Eosinophils Relative: 7 %
HCT: 39.3 % (ref 39.0–52.0)
Hemoglobin: 12.2 g/dL — ABNORMAL LOW (ref 13.0–17.0)
Immature Granulocytes: 0 %
Lymphocytes Relative: 20 %
Lymphs Abs: 1.1 10*3/uL (ref 0.7–4.0)
MCH: 30.3 pg (ref 26.0–34.0)
MCHC: 31 g/dL (ref 30.0–36.0)
MCV: 97.5 fL (ref 80.0–100.0)
Monocytes Absolute: 0.5 10*3/uL (ref 0.1–1.0)
Monocytes Relative: 10 %
Neutro Abs: 3.4 10*3/uL (ref 1.7–7.7)
Neutrophils Relative %: 62 %
Platelets: 217 10*3/uL (ref 150–400)
RBC: 4.03 MIL/uL — ABNORMAL LOW (ref 4.22–5.81)
RDW: 12.7 % (ref 11.5–15.5)
WBC: 5.5 10*3/uL (ref 4.0–10.5)
nRBC: 0 % (ref 0.0–0.2)

## 2023-05-18 NOTE — ED Notes (Signed)
[facility] called for pt transport. 

## 2023-05-18 NOTE — ED Notes (Signed)
[facility] arrived for pt transport back to [facility]

## 2023-05-18 NOTE — ED Notes (Signed)
Attempt to call wife to arrange pt transport, wife did not answer. [facility] called to provide transport and states they do not have transport. [facility] will be called

## 2023-05-18 NOTE — ED Notes (Signed)
Pt right foot wrapped in kerlex

## 2023-05-18 NOTE — ED Triage Notes (Signed)
BIB EMS from [facility] for a wound check of right foot.  Pt had toes amputated on right foot and has been having washouts of foot since. Pt continues to put weight on foot and is supposed to be non weight bearing. Wound has dehisced due to this. Pt is not having any s/s of infection at this time.  Dementia at baseline

## 2023-05-18 NOTE — Discharge Instructions (Signed)
No evidence of infection today (XR is normal/as expected post op), labs reassuring (normal WBC, normal differential).  Recommend follow up with podiatry. Continue wound care at facility.

## 2023-05-18 NOTE — ED Notes (Signed)
Attempt to call pt wife for transport back to [facility]. No response

## 2023-05-18 NOTE — ED Notes (Signed)
[facility] left pt DNR and nursing home packet. [facility] called to notify and no response. Voicemail left for facility to notify them

## 2023-05-18 NOTE — ED Provider Notes (Signed)
[place] EMERGENCY DEPARTMENT AT [facility] Provider Note   CSN: [id number] Arrival date & time: 05/18/23  1234     History  Chief Complaint  Patient presents with   Wound Check    Jason [name] is a 82 y.o. male.  82 year old male with right foot wound. Patient sent via EMS from [facility] with concern for wound dehiscence. Transmetatarsal amputation 03/30/23. Patient denies pain in the foot, no other complaints or concerns.        Home Medications Prior to Admission medications   Medication Sig Start Date End Date Taking? Authorizing Provider  acetaminophen (TYLENOL) 325 MG tablet Take 2 tablets (650 mg total) by mouth every 6 (six) hours as needed for mild pain (or Fever >/= 101). 04/02/23   Marinda Elk, MD  B-D UF III MINI PEN NEEDLES 31G X 5 MM MISC  07/10/21   [provider]  Bempedoic Acid-Ezetimibe (NEXLIZET) 180-10 MG TABS Take 1 tablet by mouth daily. 04/21/23   [name], MD  Continuous Glucose Receiver (FREESTYLE LIBRE 2 READER) DEVI 1 Act by Does not apply route daily. 04/13/23   [name], MD  Continuous Glucose Sensor (FREESTYLE LIBRE 2 SENSOR) MISC 1 Act by Does not apply route daily. Patient taking differently: Inject 1 Device into the skin every 14 (fourteen) days. 04/13/23   [name], MD  cyanocobalamin (VITAMIN B12) 500 MCG tablet Take 500 mcg by mouth daily.    [provider]  docusate sodium (COLACE) 100 MG capsule Take 1 capsule (100 mg total) by mouth 2 (two) times daily. 05/10/23   [place], Alvira Philips, DO  furosemide (LASIX) 40 MG tablet Take 1 tablet (40 mg total) by mouth 2 (two) times daily. 05/10/23 06/09/23  [place], Alvira Philips, DO  Glucagon (GVOKE HYPOPEN 2-PACK) 1 MG/0.2ML SOAJ Inject 1 Act into the skin daily as needed. Patient not taking: Reported on 04/26/2023 11/10/21   [name], MD  insulin aspart (NOVOLOG) 100 UNIT/ML FlexPen Inject 16 Units into the skin 3 (three) times daily with meals.  05/10/23   [place], Eric J, DO  insulin glargine, 2 Unit Dial, (TOUJEO MAX SOLOSTAR) 300 UNIT/ML Solostar Pen Inject 40 Units into the skin daily. 05/10/23   [place], Alvira Philips, DO  Insulin Pen Needle (B-D UF III MINI PEN NEEDLES) 31G X 5 MM MISC USE 1 PEN NEEDLE IN THE MORNING AND AT BEDTIME 04/05/23   [name], MD  metFORMIN (GLUCOPHAGE) 500 MG tablet Take 2 tablets (1,000 mg total) by mouth 2 (two) times daily with a meal. 04/02/23   Marinda Elk, MD  metoprolol succinate (TOPROL-XL) 25 MG 24 hr tablet Take 0.5 tablets (12.5 mg total) by mouth daily. 05/10/23 06/09/23  [place], Alvira Philips, DO  polyethylene glycol (MIRALAX / GLYCOLAX) 17 g packet Take 17 g by mouth daily as needed for mild constipation. 05/10/23   [place], Alvira Philips, DO  rivaroxaban (XARELTO) 20 MG TABS tablet Take 1 tablet (20 mg total) by mouth daily. Patient not taking: Reported on 04/26/2023 10/18/22   [name], MD  rosuvastatin (CRESTOR) 40 MG tablet Take 1 tablet (40 mg total) by mouth daily. 04/08/23 07/07/23  [name], MD  thiamine (VITAMIN B-1) 100 MG tablet Take 100 mg by mouth daily.    [provider]  Vitamin D, Ergocalciferol, (DRISDOL) 1.25 MG (50000 UNIT) CAPS capsule Take 50,000 Units by mouth every Wednesday.    [provider]  Allergies    Bactrim [sulfamethoxazole-trimethoprim]    Review of Systems   Review of Systems Level 5 caveat for dementia  Physical Exam Updated Vital Signs BP 125/73   Pulse 73   Temp 97.8 F (36.6 C) (Oral)   Resp 17   Ht 6\' 8"  (2.032 m)   Wt 123.4 kg   SpO2 100%   BMI 29.88 kg/m  Physical Exam Vitals and nursing note reviewed.  Constitutional:      General: He is not in acute distress.    Appearance: He is well-developed. He is not diaphoretic.  HENT:     Head: Normocephalic and atraumatic.  Pulmonary:     Effort: Pulmonary effort is normal.  Musculoskeletal:        General: No tenderness.  Skin:    General: Skin is warm and dry.   Neurological:     Mental Status: He is alert and oriented to person, place, and time.  Psychiatric:        Behavior: Behavior normal.   Additional photos in media tab.         ED Results / Procedures / Treatments   Labs (all labs ordered are listed, but only abnormal results are displayed) Labs Reviewed  BASIC METABOLIC PANEL - Abnormal; Notable for the following components:      Result Value   Chloride 97 (*)    Glucose, Bld 167 (*)    All other components within normal limits  CBC WITH DIFFERENTIAL/PLATELET - Abnormal; Notable for the following components:   RBC 4.03 (*)    Hemoglobin 12.2 (*)    All other components within normal limits    EKG None  Radiology DG Foot Complete Right  Result Date: 05/18/2023 CLINICAL DATA:  Status post transmetatarsal amputation of right foot, wound dehiscence EXAM: RIGHT FOOT COMPLETE - 3+ VIEW COMPARISON:  04/27/2023 FINDINGS: There is transmetatarsal amputation of right foot with no significant interval change. There is interval removal of some of the skin staples in the stump. Degenerative changes are noted with bony spurs in the dorsal aspect of intertarsal joints. Plantar spur is seen in calcaneus. Linear coarse calcification is seen in the course of Achilles tendon. IMPRESSION: Transmetatarsal amputation of right foot. No recent fracture or dislocation is seen. No significant interval changes are noted in the bony structures. Electronically Signed   By: Ernie Avena M.D.   On: 05/18/2023 13:20    Procedures Procedures    Medications Ordered in ED Medications - No data to display  ED Course/ Medical Decision Making/ A&P                             Medical Decision Making Amount and/or Complexity of Data Reviewed Labs: ordered. Radiology: ordered.   This patient presents to the ED for concern of right foot wound, this involves an extensive number of treatment options, and is a complaint that carries with it a high  risk of complications and morbidity.  The differential diagnosis includes but not limited to osteomyelitis, cellulitis, abscess, fracture   Co morbidities that complicate the patient evaluation  Dementia, diabetes, COPD, venous insufficiency   Additional history obtained:  Additional history obtained from EMS who contributes to history as above External records from outside source obtained and reviewed including prior XR on file   Lab Tests:  I Ordered, and personally interpreted labs.  The pertinent results include:  CBC with normal WBC, normal differential. BMP with  elevated glucose at 167 (non fasting)   Imaging Studies ordered:  I ordered imaging studies including XR right foot  I independently visualized and interpreted imaging which showed no acute findings.  I agree with the radiologist interpretation    Consultations Obtained:  I requested consultation with the Dr. Wilkie Aye, ER attending,  and discussed lab and imaging findings as well as pertinent plan - they recommend: agrees with plan of care   Problem List / ED Course / Critical interventions / Medication management  82 year old male brought in by EMS from nursing facility for concern for wound dehiscence- right foot, following transmetatarsal amputation 2 months ago. EMS reports patient is supposed to be non weight bearing but continues to walk on his foot. Unfortunately, calls to the nursing facility today for further information have gone unanswered and there is no documentation with the patient for reasons for his acute visit (med list and history only). Foot does not appear infected (no drainage, is not hot to the touch, no pain). Pictures added to the record today. XR is negative for fracture, gas, signs of osteomyelitis. Labs are reassuring, normal WBC and differential, BMP without significant findings. Discussed with ER attending who has seen the patient. Plan is for patient to return to facility to continue care  with routine follow up with podiatry.  I have reviewed the patients home medicines and have made adjustments as needed   Social Determinants of Health:  Lives at nursing facility    Test / Admission - Considered:  Dc with follow up with podiatry          Final Clinical Impression(s) / ED Diagnoses Final diagnoses:  Visit for wound check    Rx / DC Orders ED Discharge Orders     None         Jeannie Fend, PA-C 05/18/23 1444    Horton, Clabe Seal, DO 05/19/23 1015

## 2023-05-18 NOTE — ED Notes (Signed)
Pt given two sandwiches and a ginger ale.

## 2023-05-24 ENCOUNTER — Encounter: Payer: Self-pay | Admitting: Student

## 2023-05-24 ENCOUNTER — Ambulatory Visit: Payer: Medicare HMO | Admitting: Student

## 2023-05-24 ENCOUNTER — Ambulatory Visit: Payer: Medicare HMO | Attending: Student | Admitting: Student

## 2023-05-24 VITALS — BP 98/58 | HR 67 | Ht >= 80 in | Wt 274.0 lb

## 2023-05-24 DIAGNOSIS — I059 Rheumatic mitral valve disease, unspecified: Secondary | ICD-10-CM | POA: Diagnosis not present

## 2023-05-24 DIAGNOSIS — I050 Rheumatic mitral stenosis: Secondary | ICD-10-CM

## 2023-05-24 DIAGNOSIS — I5032 Chronic diastolic (congestive) heart failure: Secondary | ICD-10-CM | POA: Diagnosis not present

## 2023-05-24 DIAGNOSIS — Z794 Long term (current) use of insulin: Secondary | ICD-10-CM

## 2023-05-24 DIAGNOSIS — E119 Type 2 diabetes mellitus without complications: Secondary | ICD-10-CM

## 2023-05-24 DIAGNOSIS — I4892 Unspecified atrial flutter: Secondary | ICD-10-CM | POA: Diagnosis not present

## 2023-05-24 DIAGNOSIS — M869 Osteomyelitis, unspecified: Secondary | ICD-10-CM

## 2023-05-24 DIAGNOSIS — I739 Peripheral vascular disease, unspecified: Secondary | ICD-10-CM

## 2023-05-24 DIAGNOSIS — E785 Hyperlipidemia, unspecified: Secondary | ICD-10-CM

## 2023-05-24 NOTE — Patient Instructions (Addendum)
Medication Instructions:   No chnages  *If you need a refill on your cardiac medications before your next appointment, please call your pharmacy*   Lab Work:  No needed     Testing/Procedures: Not needed  Follow-Up: At [facility], you and your health needs are our priority.  As part of our continuing mission to provide you with exceptional heart care, we have created designated Provider Care Teams.  These Care Teams include your primary Cardiologist (physician) and Advanced Practice Providers (APPs -  Physician Assistants and Nurse Practitioners) who all work together to provide you with the care you need, when you need it.  We recommend signing up for the patient portal called "MyChart".  Sign up information is provided on this After Visit Summary.  MyChart is used to connect with patients for Virtual Visits (Telemedicine).  Patients are able to view lab/test results, encounter notes, upcoming appointments, etc.  Non-urgent messages can be sent to your provider as well.   To learn more about what you can do with MyChart, go to [email or web address].    Your next appointment:   3 month(s)  The format for your next appointment:   In Person  Provider:   [name], MD

## 2023-05-31 ENCOUNTER — Telehealth: Payer: Self-pay

## 2023-05-31 NOTE — Telephone Encounter (Signed)
Open in error   Woodfin Ganja LPN [facility] Nurse Health Advisor Direct Dial [phone]

## 2023-06-09 ENCOUNTER — Ambulatory Visit: Payer: Medicare HMO | Admitting: Podiatry

## 2023-06-09 DIAGNOSIS — L97412 Non-pressure chronic ulcer of right heel and midfoot with fat layer exposed: Secondary | ICD-10-CM | POA: Diagnosis not present

## 2023-06-09 DIAGNOSIS — E11621 Type 2 diabetes mellitus with foot ulcer: Secondary | ICD-10-CM

## 2023-06-09 DIAGNOSIS — I739 Peripheral vascular disease, unspecified: Secondary | ICD-10-CM

## 2023-06-09 DIAGNOSIS — Z89431 Acquired absence of right foot: Secondary | ICD-10-CM

## 2023-06-09 DIAGNOSIS — L97411 Non-pressure chronic ulcer of right heel and midfoot limited to breakdown of skin: Secondary | ICD-10-CM

## 2023-06-09 NOTE — Progress Notes (Signed)
  Subjective:  Patient ID: Jason [name], male    DOB: 17-Feb-1941,  MRN: [id number]  DOS: 03/30/2023 Procedure: Transmetatarsal amputation right foot  82 y.o. male returns for post-op check.  Patient is approximately 2 months from trans met amp of right foot.  They have been doing every other day betadine dressing changes. State the wounds are improving aside from one location. Patient is still taking antibiotics per ID recs.   Review of Systems: Negative except as noted in the HPI. Denies N/V/F/Ch.   Objective:  There were no vitals filed for this visit. There is no height or weight on file to calculate BMI. Constitutional Well developed. Well nourished.  Vascular Foot warm and well perfused. Capillary refill normal to all digits.  Calf is soft and supple, no posterior calf or knee pain, negative Homans' sign  Neurologic Normal speech. Oriented to person, place, and time. Epicritic sensation to light touch grossly present bilaterally.  Dermatologic Maceration is noted at the transmetatarsal amputation site on the right foot however the suture line is intact with staples intact.  Wounds present along the incision line lateral aspect, probes to sub q tissue. Mild malodor.     Orthopedic: Tenderness to palpation noted about the surgical site.   Multiple view plain film radiographs: Deferred Assessment:   1. S/P transmetatarsal amputation of foot, right (HCC)   2. Diabetic ulcer of right midfoot associated with type 2 diabetes mellitus, limited to breakdown of skin (HCC)   3. PAD (peripheral artery disease) (HCC)   4. Ulcer of right midfoot with fat layer exposed (HCC)     Plan:  Patient was evaluated and treated and all questions answered.  S/p foot surgery right transmetatarsal amputation -Progressing with concern for ulceration at the lateral aspect of the amputation site, however overall appears improved after wound care.  -Continue antibitocs per ID -XR: Deferred at this  visit -WB Status: WBAT in post op shoe -Sutures: Staples and sutures removed in total -Wounds debrided to healthy bleeding base.  -Medications: Antibiotics as above.  Patient does not have any pain associated with this area -Foot redressed with Betadine wet-to-dry dressing. -Recommend patient continue daily Betadine wet-to-dry dressings and explained and showed him how to do this.  -Recommend wound care referral, placed order at this vist.   Return in about 4 weeks (around 07/07/2023) for f/u R foot trans met amp site wounds.         Corinna Gab, DPM [facility] / [facility]

## 2023-06-21 ENCOUNTER — Telehealth: Payer: Self-pay | Admitting: Internal Medicine

## 2023-06-21 NOTE — Telephone Encounter (Signed)
03/29/23 OV note has been faxed back to [facility].

## 2023-06-21 NOTE — Telephone Encounter (Signed)
[facility] - called - Phone:  [phone]  Would like office notes from patient's May, 2024 appointment faxed to :  [phone]

## 2023-06-23 ENCOUNTER — Ambulatory Visit: Payer: Medicare HMO | Admitting: Internal Medicine

## 2023-07-07 ENCOUNTER — Ambulatory Visit: Payer: Medicare HMO | Admitting: Podiatry

## 2023-07-07 ENCOUNTER — Encounter: Payer: Self-pay | Admitting: Podiatry

## 2023-07-07 DIAGNOSIS — E11621 Type 2 diabetes mellitus with foot ulcer: Secondary | ICD-10-CM | POA: Diagnosis not present

## 2023-07-07 DIAGNOSIS — L97412 Non-pressure chronic ulcer of right heel and midfoot with fat layer exposed: Secondary | ICD-10-CM

## 2023-07-07 DIAGNOSIS — I739 Peripheral vascular disease, unspecified: Secondary | ICD-10-CM

## 2023-07-07 DIAGNOSIS — Z89431 Acquired absence of right foot: Secondary | ICD-10-CM

## 2023-07-07 NOTE — Progress Notes (Signed)
  Subjective:  Patient ID: Jason [name], male    DOB: 04/11/1941,  MRN: [id number]  DOS: 03/30/2023 Procedure: Transmetatarsal amputation right foot  82 y.o. male returns for post-op check.  Patient is approximately 3 months from trans met amp of right foot.  Patient has finished antibiotics.  He is scheduled to go to wound care next week.  Patient has been washing the wound with wound spray but has not been putting anything else on.  Report mild drainage from the area.  Review of Systems: Negative except as noted in the HPI. Denies N/V/F/Ch.   Objective:  There were no vitals filed for this visit. There is no height or weight on file to calculate BMI. Constitutional Well developed. Well nourished.  Vascular Foot warm and well perfused. Capillary refill normal to all digits.  Calf is soft and supple, no posterior calf or knee pain, negative Homans' sign  Neurologic Normal speech. Oriented to person, place, and time. Epicritic sensation to light touch grossly absent to right foot  Dermatologic No maceration is noted at the transmetatarsal amputation site on the right foot improved from prior however the suture line is intact with staples intact.  Decreased wound present from the incision line from prior with smaller area with hyperkeratotic and fibrotic Overlying the ulcer.  This is on the lateral margin.  There is also a secondary hypergranular sinus tract at the dorsal aspect of the amputation site.  Bleeds significantly upon debridement but there is no pus expressed from the area.    Orthopedic: S/p R foot tMA. Nontender to palpation noted about the surgical site secondary to neuropathy.   Multiple view plain film radiographs: Deferred Assessment:   1. Diabetic ulcer of right midfoot associated with type 2 diabetes mellitus, with fat layer exposed (HCC)   2. S/P transmetatarsal amputation of foot, right (HCC)   3. PAD (peripheral artery disease) (HCC)      Plan:  Patient was  evaluated and treated and all questions answered.  S/p foot surgery right transmetatarsal amputation with ulceration present at the amputation site -Progressing with concern for ulceration at the lateral aspect of the amputation site, however overall appears improved after wound care.  -Patient is currently finished with oral antibiotics.  Hold on further antibiotics at this time will consider future antibiotics if needed if the wound worsens -XR: Deferred at this visit -WB Status: WBAT in post op shoe -Sutures: Staples and sutures previously removed however removed additional staple today -Wounds debrided to healthy bleeding base as below, removed hyperkeratotic tissue surrounding the forefoot -Medications: Antibiotics as above.  Patient does not have any pain associated with this area -Foot redressed with gauze dressing -Recommend patient continue daily dressing changes with iodoform and gauze  -Recommend wound care and patient is going to see them next week  Procedure: Excisional Debridement of Wound Rationale: Removal of non-viable soft tissue from the wound to promote healing.  Anesthesia: none Post-Debridement Wound Measurements: 1.5 cm x 0.8 cm x 0.2 cm  Type of Debridement: Sharp Excisional Tissue Removed: Non-viable soft tissue Depth of Debridement: subcutaneous tissue. Technique: Sharp excisional debridement to bleeding, viable wound base.  Dressing: Dry, sterile, compression dressing. Disposition: Patient tolerated procedure well.   Return in about 3 weeks (around 07/28/2023) for f/u R foot transmet amp site ulcer.         Corinna Gab, DPM [facility] / [facility]

## 2023-07-13 ENCOUNTER — Encounter (HOSPITAL_BASED_OUTPATIENT_CLINIC_OR_DEPARTMENT_OTHER): Payer: Medicare Other | Attending: Physician Assistant | Admitting: Internal Medicine

## 2023-07-13 DIAGNOSIS — I4891 Unspecified atrial fibrillation: Secondary | ICD-10-CM | POA: Insufficient documentation

## 2023-07-13 DIAGNOSIS — E1151 Type 2 diabetes mellitus with diabetic peripheral angiopathy without gangrene: Secondary | ICD-10-CM | POA: Diagnosis not present

## 2023-07-13 DIAGNOSIS — B351 Tinea unguium: Secondary | ICD-10-CM | POA: Insufficient documentation

## 2023-07-13 DIAGNOSIS — L97518 Non-pressure chronic ulcer of other part of right foot with other specified severity: Secondary | ICD-10-CM | POA: Insufficient documentation

## 2023-07-13 DIAGNOSIS — E1142 Type 2 diabetes mellitus with diabetic polyneuropathy: Secondary | ICD-10-CM | POA: Diagnosis present

## 2023-07-13 DIAGNOSIS — G4730 Sleep apnea, unspecified: Secondary | ICD-10-CM | POA: Diagnosis not present

## 2023-07-13 DIAGNOSIS — E785 Hyperlipidemia, unspecified: Secondary | ICD-10-CM | POA: Insufficient documentation

## 2023-07-13 DIAGNOSIS — E11621 Type 2 diabetes mellitus with foot ulcer: Secondary | ICD-10-CM | POA: Diagnosis not present

## 2023-07-13 DIAGNOSIS — I872 Venous insufficiency (chronic) (peripheral): Secondary | ICD-10-CM | POA: Insufficient documentation

## 2023-07-13 DIAGNOSIS — Z833 Family history of diabetes mellitus: Secondary | ICD-10-CM | POA: Insufficient documentation

## 2023-07-13 DIAGNOSIS — Z7901 Long term (current) use of anticoagulants: Secondary | ICD-10-CM | POA: Diagnosis not present

## 2023-07-13 DIAGNOSIS — I050 Rheumatic mitral stenosis: Secondary | ICD-10-CM | POA: Diagnosis not present

## 2023-07-13 DIAGNOSIS — I5032 Chronic diastolic (congestive) heart failure: Secondary | ICD-10-CM | POA: Insufficient documentation

## 2023-07-15 NOTE — Progress Notes (Addendum)
MD Entered By: Karie Schwalbe on 07/20/2023 15:45:20 -------------------------------------------------------------------------------- HxROS Details Patient Name: Date of Service: Jason Jobs. 07/13/2023 8:00 A M Medical Record Number: [id number] Patient Account Number: [id number] Date of Birth/Sex: Treating RN: 1941-01-17 (82 y.o. Jason [name] Primary Care Provider: [name] Other Clinician: MARUS, WOOLLEY ([id number]) [id number] Page 9 of 11 Referring Provider: Treating Provider/Extender: Erasmo Downer in Treatment: 0 Information Obtained From Patient Eyes Complaints and Symptoms: Positive for: Glasses / Contacts - Glasses Medical History: Negative for: Cataracts; Glaucoma; Optic Neuritis Integumentary (Skin) Complaints and Symptoms: Positive for: Wounds - tic Foot wound (R) Medical History: Negative for: History of Burn Past Medical History Notes: (R) plantar ulceration , (L) Plantar callousing , Ulcer of Right foot with necrosis of bone Subacute osteomyelitis of right foot Constitutional Symptoms (General Health) Medical History: Past Medical History Notes: overgrown toenails (has appt. with podiatry 01/26/17) , obesity , Ear/Nose/Mouth/Throat Medical History: Negative for: Chronic sinus problems/congestion; Middle ear problems Hematologic/Lymphatic Medical History: Negative for: Anemia; Hemophilia; Human Immunodeficiency Virus;  Lymphedema; Sickle Cell Disease Respiratory Medical History: Positive for: Sleep Apnea - uses CPAP Negative for: Aspiration; Asthma; Chronic Obstructive Pulmonary Disease (COPD); Pneumothorax; Tuberculosis Cardiovascular Medical History: Positive for: Arrhythmia - Atrial Flutter; Congestive Heart Failure; Peripheral Arterial Disease; Peripheral Venous Disease Negative for: Angina; Coronary Artery Disease; Deep Vein Thrombosis; Hypertension; Hypotension; Myocardial Infarction; Phlebitis; Vasculitis Past Medical History Notes: hyperlipidemia , atherosclerosis of extremity arteries , mitral valve disorder , Gastrointestinal Medical History: Negative for: Cirrhosis ; Colitis; Crohns; Hepatitis A; Hepatitis B; Hepatitis C Past Medical History Notes: GERD , IBS , Endocrine Medical History: Positive for: Type II Diabetes Negative for: Type I Diabetes Time with diabetes: 69yrs Treated with: Insulin Blood sugar tested every day: Yes Tested : daily Genitourinary Medical History: Negative for: End Stage Renal Disease Past Medical History Notes: BPH , hematuria Benign Prostatic Hyperplasia Jason [name], Jason [name] ([id number]) [id number] Page 10 of 11 Immunological Medical History: Negative for: Lupus Erythematosus; Raynauds; Scleroderma Musculoskeletal Medical History: Negative for: Gout; Rheumatoid Arthritis; Osteoarthritis; Osteomyelitis Past Medical History Notes: DJD , Neurologic Medical History: Positive for: Neuropathy Negative for: Dementia; Quadriplegia; Paraplegia; Seizure Disorder Oncologic Medical History: Negative for: Received Chemotherapy; Received Radiation Psychiatric Medical History: Negative for: Anorexia/bulimia; Confinement Anxiety Past Medical History Notes: depression Immunizations Pneumococcal Vaccine: Received Pneumococcal Vaccination: Yes Received Pneumococcal Vaccination On or After 60th Birthday: Yes Implantable Devices No  devices added Hospitalization / Surgery History Type of Hospitalization/Surgery abdominal intestine surgery Right T (x3) amputation (06/2009) oe Metatarsal Osteotomy (right) 05/17/2019 Laser Ablation (Left Greater Saphenous Vein) 2010 TURBT (07/07/2018) Family and Social History Cancer: Yes - Mother,Father; Diabetes: Yes - Father; Heart Disease: Yes - Father; Hereditary Spherocytosis: No; Hypertension: Yes - Father; Kidney Disease: Yes - Father; Lung Disease: No; Seizures: No; Stroke: Yes - Father; Thyroid Problems: No; Tuberculosis: No; Former smoker; Marital Status - Married; Alcohol Use: Moderate - beer; Drug Use: Prior History - TCH; Caffeine Use: Never; Financial Concerns: No; Food, Clothing or Shelter Needs: No; Support System Lacking: No; Transportation Concerns: No Electronic Signature(s) Signed: 07/13/2023 5:08:25 PM By: Baltazar Najjar MD Signed: 07/14/2023 5:30:58 PM By: Karie Schwalbe RN Entered By: Karie Schwalbe on 07/13/2023 08:51:00 -------------------------------------------------------------------------------- SuperBill Details Patient Name: Date of Service: Jason Jobs 07/13/2023 Medical Record Number: [id number] Patient Account Number: [id number] Date of Birth/Sex: Treating RN: 08-20-1941 (82 y.o. M) Primary Care Provider: [name] Other Clinician: Referring Provider: Treating Provider/Extender: Erasmo Downer in Treatment: 0  MD Entered By: Karie Schwalbe on 07/20/2023 15:45:20 -------------------------------------------------------------------------------- HxROS Details Patient Name: Date of Service: Jason Jobs. 07/13/2023 8:00 A M Medical Record Number: [id number] Patient Account Number: [id number] Date of Birth/Sex: Treating RN: 1941-01-17 (82 y.o. Jason [name] Primary Care Provider: [name] Other Clinician: MARUS, WOOLLEY ([id number]) [id number] Page 9 of 11 Referring Provider: Treating Provider/Extender: Erasmo Downer in Treatment: 0 Information Obtained From Patient Eyes Complaints and Symptoms: Positive for: Glasses / Contacts - Glasses Medical History: Negative for: Cataracts; Glaucoma; Optic Neuritis Integumentary (Skin) Complaints and Symptoms: Positive for: Wounds - tic Foot wound (R) Medical History: Negative for: History of Burn Past Medical History Notes: (R) plantar ulceration , (L) Plantar callousing , Ulcer of Right foot with necrosis of bone Subacute osteomyelitis of right foot Constitutional Symptoms (General Health) Medical History: Past Medical History Notes: overgrown toenails (has appt. with podiatry 01/26/17) , obesity , Ear/Nose/Mouth/Throat Medical History: Negative for: Chronic sinus problems/congestion; Middle ear problems Hematologic/Lymphatic Medical History: Negative for: Anemia; Hemophilia; Human Immunodeficiency Virus;  Lymphedema; Sickle Cell Disease Respiratory Medical History: Positive for: Sleep Apnea - uses CPAP Negative for: Aspiration; Asthma; Chronic Obstructive Pulmonary Disease (COPD); Pneumothorax; Tuberculosis Cardiovascular Medical History: Positive for: Arrhythmia - Atrial Flutter; Congestive Heart Failure; Peripheral Arterial Disease; Peripheral Venous Disease Negative for: Angina; Coronary Artery Disease; Deep Vein Thrombosis; Hypertension; Hypotension; Myocardial Infarction; Phlebitis; Vasculitis Past Medical History Notes: hyperlipidemia , atherosclerosis of extremity arteries , mitral valve disorder , Gastrointestinal Medical History: Negative for: Cirrhosis ; Colitis; Crohns; Hepatitis A; Hepatitis B; Hepatitis C Past Medical History Notes: GERD , IBS , Endocrine Medical History: Positive for: Type II Diabetes Negative for: Type I Diabetes Time with diabetes: 69yrs Treated with: Insulin Blood sugar tested every day: Yes Tested : daily Genitourinary Medical History: Negative for: End Stage Renal Disease Past Medical History Notes: BPH , hematuria Benign Prostatic Hyperplasia Jason [name], Jason [name] ([id number]) [id number] Page 10 of 11 Immunological Medical History: Negative for: Lupus Erythematosus; Raynauds; Scleroderma Musculoskeletal Medical History: Negative for: Gout; Rheumatoid Arthritis; Osteoarthritis; Osteomyelitis Past Medical History Notes: DJD , Neurologic Medical History: Positive for: Neuropathy Negative for: Dementia; Quadriplegia; Paraplegia; Seizure Disorder Oncologic Medical History: Negative for: Received Chemotherapy; Received Radiation Psychiatric Medical History: Negative for: Anorexia/bulimia; Confinement Anxiety Past Medical History Notes: depression Immunizations Pneumococcal Vaccine: Received Pneumococcal Vaccination: Yes Received Pneumococcal Vaccination On or After 60th Birthday: Yes Implantable Devices No  devices added Hospitalization / Surgery History Type of Hospitalization/Surgery abdominal intestine surgery Right T (x3) amputation (06/2009) oe Metatarsal Osteotomy (right) 05/17/2019 Laser Ablation (Left Greater Saphenous Vein) 2010 TURBT (07/07/2018) Family and Social History Cancer: Yes - Mother,Father; Diabetes: Yes - Father; Heart Disease: Yes - Father; Hereditary Spherocytosis: No; Hypertension: Yes - Father; Kidney Disease: Yes - Father; Lung Disease: No; Seizures: No; Stroke: Yes - Father; Thyroid Problems: No; Tuberculosis: No; Former smoker; Marital Status - Married; Alcohol Use: Moderate - beer; Drug Use: Prior History - TCH; Caffeine Use: Never; Financial Concerns: No; Food, Clothing or Shelter Needs: No; Support System Lacking: No; Transportation Concerns: No Electronic Signature(s) Signed: 07/13/2023 5:08:25 PM By: Baltazar Najjar MD Signed: 07/14/2023 5:30:58 PM By: Karie Schwalbe RN Entered By: Karie Schwalbe on 07/13/2023 08:51:00 -------------------------------------------------------------------------------- SuperBill Details Patient Name: Date of Service: Jason Jobs 07/13/2023 Medical Record Number: [id number] Patient Account Number: [id number] Date of Birth/Sex: Treating RN: 08-20-1941 (82 y.o. M) Primary Care Provider: [name] Other Clinician: Referring Provider: Treating Provider/Extender: Erasmo Downer in Treatment: 0  Jason [name], Jason [name] ([id number]) [id number] Page 1 of 11 Visit Report for 07/13/2023 Chief Complaint Document Details Patient Name: Date of Service: Jason [name], Jason [name] 07/13/2023 8:00 A M Medical Record Number: [id number] Patient Account Number: [id number] Date of Birth/Sex: Treating RN: [birth year] (82 y.o. M) Primary Care Provider: [name] Other Clinician: Referring Provider: Treating Provider/Extender: Erasmo Downer in Treatment: 0 Information Obtained from: Patient Chief Complaint 01/31/17; patient is here for review of wound on his left great toe 07/13/2023; patient comes back to clinic for review of wounds on his right transmetatarsal amputation site Electronic Signature(s) Signed: 07/13/2023 5:08:25 PM By: Baltazar Najjar MD Entered By: Baltazar Najjar on 07/13/2023 09:49:49 -------------------------------------------------------------------------------- Debridement Details Patient Name: Date of Service: Jason Milian S. 07/13/2023 8:00 A M Medical Record Number: [id number] Patient Account Number: [id number] Date of Birth/Sex: Treating RN: Apr 05, 1941 (82 y.o. M) Primary Care Provider: [name] Other Clinician: Referring Provider: Treating Provider/Extender: Erasmo Downer in Treatment: 0 Debridement Performed for Assessment: Wound #5 Right Amputation Site - Transmetatarsal Performed By: Physician Maxwell Caul., MD Debridement Type: Debridement Severity of Tissue Pre Debridement: Fat layer exposed Level of Consciousness (Pre-procedure): Awake and Alert Pre-procedure Verification/Time Out Yes - 09:20 Taken: Start Time: 09:20 Pain Control: Lidocaine 4% T opical Solution Percent of Wound Bed Debrided: 100% T Area Debrided (cm): otal 3.34 Tissue and other material debrided: Viable, Non-Viable, Slough, Subcutaneous, Slough Level: Skin/Subcutaneous Tissue Debridement Description: Excisional Instrument:  Curette Bleeding: Minimum Hemostasis Achieved: Pressure End Time: 09:22 Procedural Pain: 0 Post Procedural Pain: 0 Response to Treatment: Procedure was tolerated well Level of Consciousness (Post- Awake and Alert procedure): Post Debridement Measurements of Total Wound Length: (cm) 1.7 Width: (cm) 2.5 Depth: (cm) 0.4 Volume: (cm) 1.335 Jason [name], Jason [name] ([id number]) [id number] Page 2 of 11 Character of Wound/Ulcer Post Debridement: Improved Severity of Tissue Post Debridement: Fat layer exposed Post Procedure Diagnosis Same as Pre-procedure Notes Scribed for Dr. Leanord Hawking by J.Scotton Electronic Signature(s) Signed: 07/13/2023 5:08:25 PM By: Baltazar Najjar MD Entered By: Baltazar Najjar on 07/13/2023 09:49:13 -------------------------------------------------------------------------------- HPI Details Patient Name: Date of Service: Jason Milian S. 07/13/2023 8:00 A M Medical Record Number: [id number] Patient Account Number: [id number] Date of Birth/Sex: Treating RN: 23-Oct-1941 (82 y.o. M) Primary Care Provider: [name] Other Clinician: Referring Provider: Treating Provider/Extender: Erasmo Downer in Treatment: 0 History of Present Illness HPI Description: 01/31/17; this is a 82 year old man who has a history of type 2 diabetes on oral agents. He has a history of wound difficulties however this is on his right foot in fact he has had amputations of his medial 3 toes. I did not actually look at the right foot today. He tells me about a month ago he was cutting his mycotic nails informed a wound on the medial aspect of the left great toe. He saw Dr. Logan Bores of podiatry. He has been using Neosporin for a while and now simply wrapping it with gauze. He states he does have peripheral neuropathy. His hemoglobin A1c in this clinic was 1. 02/21/1989 non-compressible on the left. He did have vascular studies done in 2015 showing ABIs of 1.4  bilaterally, TBI's of 1.0 on the right 1.1 on the left with triphasic waveforms. He has not had imaging studies that I'm aware of 02/07/17; this is a 82 year old man with type 2 diabetes on oral agents. [place] with a small open area on the medial  Foot Wound Laterality: Dorsal, Right, Lateral Cleanser: Soap and Water 3 x Per Week/30 Days Discharge Instructions: May shower and wash wound with dial antibacterial soap and water prior to dressing change. Cleanser: Vashe 5.8 (oz) 3 x Per Week/30 Days Discharge Instructions: Cleanse the wound with Vashe prior to applying a clean dressing using gauze sponges, not tissue or cotton balls. Cleanser: Wound Cleanser 3 x Per Week/30 Days Discharge Instructions: Cleanse the wound with wound cleanser prior to applying a clean dressing using gauze sponges, not tissue or cotton balls. Prim Dressing: Hydrofera Blue Ready Transfer Foam, 2.5x2.5 (in/in) 3 x Per Week/30 Days ary Discharge Instructions: Place HBR into wound and place over wound bed directly Secondary Dressing: Woven Gauze Sponge, Non-Sterile 4x4 in 3 x Per Week/30 Days Discharge Instructions: Apply over primary dressing as directed. Secured With: [employer], 4.5x3.1 (in/yd) 3 x Per Week/30 Days Discharge Instructions: Secure with Kerlix as directed. Electronic Signature(s) Signed: 07/21/2023 4:36:51 AM By: Baltazar Najjar MD Signed: 07/29/2023 8:27:42 AM By: Karie Schwalbe RN Previous Signature: 07/13/2023 5:08:25 PM Version By: Baltazar Najjar MD Previous Signature: 07/14/2023 5:30:58 PM Version By: Karie Schwalbe RN Entered By: Karie Schwalbe on 07/20/2023 15:44:51 -------------------------------------------------------------------------------- Problem List Details Patient Name: Date of Service: Jason Jobs. 07/13/2023 8:00 A M Medical Record Number: [id number] Patient Account Number: [id number] Date of Birth/Sex: Treating RN: November 02, 1941 (82 y.o. M) Primary Care Provider: [name] Other  Clinician: Referring Provider: Treating Provider/Extender: Erasmo Downer in Treatment: [address] [place], [place] Winifred Olive ([id number]) [id number] Page 5 of 11 Active Problems ICD-10 Encounter Code Description Active Date MDM Diagnosis E11.621 Type 2 diabetes mellitus with foot ulcer 07/13/2023 No Yes L97.518 Non-pressure chronic ulcer of other part of right foot with other specified 07/13/2023 No Yes severity E11.51 Type 2 diabetes mellitus with diabetic peripheral angiopathy without gangrene 07/13/2023 No Yes E11.42 Type 2 diabetes mellitus with diabetic polyneuropathy 07/13/2023 No Yes Inactive Problems Resolved Problems Electronic Signature(s) Signed: 07/13/2023 5:08:25 PM By: Baltazar Najjar MD Entered By: Baltazar Najjar on 07/13/2023 09:46:07 -------------------------------------------------------------------------------- Progress Note Details Patient Name: Date of Service: Jason Milian S. 07/13/2023 8:00 A M Medical Record Number: [id number] Patient Account Number: [id number] Date of Birth/Sex: Treating RN: 10/29/1941 (82 y.o. M) Primary Care Provider: [name] Other Clinician: Referring Provider: Treating Provider/Extender: Erasmo Downer in Treatment: 0 Subjective Chief Complaint Information obtained from Patient 01/31/17; patient is here for review of wound on his left great toe 07/13/2023; patient comes back to clinic for review of wounds on his right transmetatarsal amputation site History of Present Illness (HPI) 01/31/17; this is a 82 year old man who has a history of type 2 diabetes on oral agents. He has a history of wound difficulties however this is on his right foot in fact he has had amputations of his medial 3 toes. I did not actually look at the right foot today. He tells me about a month ago he was cutting his mycotic nails informed a wound on the medial aspect of the left great toe. He saw Dr. Logan Bores of  podiatry. He has been using Neosporin for a while and now simply wrapping it with gauze. He states he does have peripheral neuropathy. His hemoglobin A1c in this clinic was 1. 02/21/1989 non-compressible on the left. He did have vascular studies done in 2015 showing ABIs of 1.4 bilaterally, TBI's of 1.0 on the right 1.1 on the left with triphasic waveforms. He has not had imaging studies that I'm  PMI perhaps a soft diastolic rumble. JVP not elevated no gallops. Pedal pulses were present at the dorsalis pedis and posterior tibial although certainly not robust. Popliteal and femoral pulses were palpable. Edema present in both extremities. Nonpitting. He likely has chronic venous insufficiency with secondary lymphedema skin changes related to this. Notes Wound exam; the area in question is actually roughly at the level of the fourth fifth met heads and the amputation site. The wound had debris on the surface on the superior medial part of it there was a tunnel roughly 0.7 cm. On the dorsal aspect of the foot there is also a small tunnel but I could not prove that these connect. There was no purulent drainage no obvious erythema.I used a #3 curette to debride the wound on the tip of the foot removing fibrinous surface slough some subcutaneous tissue also went into the tunnel removing subcutaneous tissue. Hemostasis with direct pressure although bleeding was minimal Electronic Signature(s) Signed: 07/13/2023 5:08:25 PM By: Baltazar Najjar MD Entered By: Baltazar Najjar on 07/13/2023 09:58:32 -------------------------------------------------------------------------------- Physician Orders Details Patient Name: Date of Service: Jason Milian S. 07/13/2023 8:00 A M Medical Record Number: [id number] Patient Account Number: [id number] Date of Birth/Sex: Treating RN: 07/10/1941 (82 y.o. Jason [name] Primary Care Provider: [name] Other Clinician: Referring Provider: Treating Provider/Extender: Erasmo Downer in Treatment: 0 Verbal / Phone Orders: No Diagnosis Coding Follow-up Appointments ppointment in 1 week. Allen Derry PA Wednesday Return A Anesthetic (In clinic) Topical Lidocaine 4%  applied to wound bed - Used in Clinic Bathing/ Shower/ Hygiene May shower and wash wound with soap and water. - After shower, may change wound dressing if so desired Off-Loading Wound #6 Right,Lateral,Dorsal Foot Other: - Elevate feet when sitting/in bed. Place a pillow behind the knee for comfort. Jason [name], Jason [name] ([id number]) [id number] Page 4 of 11 Home Health Wound #6 Right,Lateral,Dorsal Foot Admit to Home Health for skilled nursing wound care. May utilize formulary equivalent dressing for wound treatment orders unless otherwise specified. - Please change dressing at least twice a week. The 3rd wound change in the week will be done at the Wound care center [facility]) New wound care orders this week; continue Home Health for wound care. May utilize formulary equivalent dressing for wound treatment orders unless otherwise specified. - Please change dressing at least twice a week. The 3rd wound change in the week will be done at the Wound care center [facility]) Other Home Health Orders/Instructions: Rolene Arbour will perform Home Health Wound Treatment Wound #5 - Amputation Site - Transmetatarsal Wound Laterality: Right Cleanser: Soap and Water 3 x Per Week/30 Days Discharge Instructions: May shower and wash wound with dial antibacterial soap and water prior to dressing change. Cleanser: Vashe 5.8 (oz) 3 x Per Week/30 Days Discharge Instructions: Cleanse the wound with Vashe prior to applying a clean dressing using gauze sponges, not tissue or cotton balls. Cleanser: Wound Cleanser 3 x Per Week/30 Days Discharge Instructions: Cleanse the wound with wound cleanser prior to applying a clean dressing using gauze sponges, not tissue or cotton balls. Prim Dressing: Hydrofera Blue Ready Transfer Foam, 2.5x2.5 (in/in) 3 x Per Week/30 Days ary Discharge Instructions: Place HBR into wound and place over wound bed directly Secondary Dressing: Woven Gauze Sponge, Non-Sterile 4x4 in 3  x Per Week/30 Days Discharge Instructions: Apply over primary dressing as directed. Secured With: [employer], 4.5x3.1 (in/yd) 3 x Per Week/30 Days Discharge Instructions: Secure with Kerlix as directed. Wound #6 -  PMI perhaps a soft diastolic rumble. JVP not elevated no gallops. Pedal pulses were present at the dorsalis pedis and posterior tibial although certainly not robust. Popliteal and femoral pulses were palpable. Edema present in both extremities. Nonpitting. He likely has chronic venous insufficiency with secondary lymphedema skin changes related to this. Notes Wound exam; the area in question is actually roughly at the level of the fourth fifth met heads and the amputation site. The wound had debris on the surface on the superior medial part of it there was a tunnel roughly 0.7 cm. On the dorsal aspect of the foot there is also a small tunnel but I could not prove that these connect. There was no purulent drainage no obvious erythema.I used a #3 curette to debride the wound on the tip of the foot removing fibrinous surface slough some subcutaneous tissue also went into the tunnel removing subcutaneous tissue. Hemostasis with direct pressure although bleeding was minimal Electronic Signature(s) Signed: 07/13/2023 5:08:25 PM By: Baltazar Najjar MD Entered By: Baltazar Najjar on 07/13/2023 09:58:32 -------------------------------------------------------------------------------- Physician Orders Details Patient Name: Date of Service: Jason Milian S. 07/13/2023 8:00 A M Medical Record Number: [id number] Patient Account Number: [id number] Date of Birth/Sex: Treating RN: 07/10/1941 (82 y.o. Jason [name] Primary Care Provider: [name] Other Clinician: Referring Provider: Treating Provider/Extender: Erasmo Downer in Treatment: 0 Verbal / Phone Orders: No Diagnosis Coding Follow-up Appointments ppointment in 1 week. Allen Derry PA Wednesday Return A Anesthetic (In clinic) Topical Lidocaine 4%  applied to wound bed - Used in Clinic Bathing/ Shower/ Hygiene May shower and wash wound with soap and water. - After shower, may change wound dressing if so desired Off-Loading Wound #6 Right,Lateral,Dorsal Foot Other: - Elevate feet when sitting/in bed. Place a pillow behind the knee for comfort. Jason [name], Jason [name] ([id number]) [id number] Page 4 of 11 Home Health Wound #6 Right,Lateral,Dorsal Foot Admit to Home Health for skilled nursing wound care. May utilize formulary equivalent dressing for wound treatment orders unless otherwise specified. - Please change dressing at least twice a week. The 3rd wound change in the week will be done at the Wound care center [facility]) New wound care orders this week; continue Home Health for wound care. May utilize formulary equivalent dressing for wound treatment orders unless otherwise specified. - Please change dressing at least twice a week. The 3rd wound change in the week will be done at the Wound care center [facility]) Other Home Health Orders/Instructions: Rolene Arbour will perform Home Health Wound Treatment Wound #5 - Amputation Site - Transmetatarsal Wound Laterality: Right Cleanser: Soap and Water 3 x Per Week/30 Days Discharge Instructions: May shower and wash wound with dial antibacterial soap and water prior to dressing change. Cleanser: Vashe 5.8 (oz) 3 x Per Week/30 Days Discharge Instructions: Cleanse the wound with Vashe prior to applying a clean dressing using gauze sponges, not tissue or cotton balls. Cleanser: Wound Cleanser 3 x Per Week/30 Days Discharge Instructions: Cleanse the wound with wound cleanser prior to applying a clean dressing using gauze sponges, not tissue or cotton balls. Prim Dressing: Hydrofera Blue Ready Transfer Foam, 2.5x2.5 (in/in) 3 x Per Week/30 Days ary Discharge Instructions: Place HBR into wound and place over wound bed directly Secondary Dressing: Woven Gauze Sponge, Non-Sterile 4x4 in 3  x Per Week/30 Days Discharge Instructions: Apply over primary dressing as directed. Secured With: [employer], 4.5x3.1 (in/yd) 3 x Per Week/30 Days Discharge Instructions: Secure with Kerlix as directed. Wound #6 -  Jason [name], Jason [name] ([id number]) [id number] Page 1 of 11 Visit Report for 07/13/2023 Chief Complaint Document Details Patient Name: Date of Service: Jason [name], Jason [name] 07/13/2023 8:00 A M Medical Record Number: [id number] Patient Account Number: [id number] Date of Birth/Sex: Treating RN: [birth year] (82 y.o. M) Primary Care Provider: [name] Other Clinician: Referring Provider: Treating Provider/Extender: Erasmo Downer in Treatment: 0 Information Obtained from: Patient Chief Complaint 01/31/17; patient is here for review of wound on his left great toe 07/13/2023; patient comes back to clinic for review of wounds on his right transmetatarsal amputation site Electronic Signature(s) Signed: 07/13/2023 5:08:25 PM By: Baltazar Najjar MD Entered By: Baltazar Najjar on 07/13/2023 09:49:49 -------------------------------------------------------------------------------- Debridement Details Patient Name: Date of Service: Jason Milian S. 07/13/2023 8:00 A M Medical Record Number: [id number] Patient Account Number: [id number] Date of Birth/Sex: Treating RN: Apr 05, 1941 (82 y.o. M) Primary Care Provider: [name] Other Clinician: Referring Provider: Treating Provider/Extender: Erasmo Downer in Treatment: 0 Debridement Performed for Assessment: Wound #5 Right Amputation Site - Transmetatarsal Performed By: Physician Maxwell Caul., MD Debridement Type: Debridement Severity of Tissue Pre Debridement: Fat layer exposed Level of Consciousness (Pre-procedure): Awake and Alert Pre-procedure Verification/Time Out Yes - 09:20 Taken: Start Time: 09:20 Pain Control: Lidocaine 4% T opical Solution Percent of Wound Bed Debrided: 100% T Area Debrided (cm): otal 3.34 Tissue and other material debrided: Viable, Non-Viable, Slough, Subcutaneous, Slough Level: Skin/Subcutaneous Tissue Debridement Description: Excisional Instrument:  Curette Bleeding: Minimum Hemostasis Achieved: Pressure End Time: 09:22 Procedural Pain: 0 Post Procedural Pain: 0 Response to Treatment: Procedure was tolerated well Level of Consciousness (Post- Awake and Alert procedure): Post Debridement Measurements of Total Wound Length: (cm) 1.7 Width: (cm) 2.5 Depth: (cm) 0.4 Volume: (cm) 1.335 Jason [name], Jason [name] ([id number]) [id number] Page 2 of 11 Character of Wound/Ulcer Post Debridement: Improved Severity of Tissue Post Debridement: Fat layer exposed Post Procedure Diagnosis Same as Pre-procedure Notes Scribed for Dr. Leanord Hawking by J.Scotton Electronic Signature(s) Signed: 07/13/2023 5:08:25 PM By: Baltazar Najjar MD Entered By: Baltazar Najjar on 07/13/2023 09:49:13 -------------------------------------------------------------------------------- HPI Details Patient Name: Date of Service: Jason Milian S. 07/13/2023 8:00 A M Medical Record Number: [id number] Patient Account Number: [id number] Date of Birth/Sex: Treating RN: 23-Oct-1941 (82 y.o. M) Primary Care Provider: [name] Other Clinician: Referring Provider: Treating Provider/Extender: Erasmo Downer in Treatment: 0 History of Present Illness HPI Description: 01/31/17; this is a 82 year old man who has a history of type 2 diabetes on oral agents. He has a history of wound difficulties however this is on his right foot in fact he has had amputations of his medial 3 toes. I did not actually look at the right foot today. He tells me about a month ago he was cutting his mycotic nails informed a wound on the medial aspect of the left great toe. He saw Dr. Logan Bores of podiatry. He has been using Neosporin for a while and now simply wrapping it with gauze. He states he does have peripheral neuropathy. His hemoglobin A1c in this clinic was 1. 02/21/1989 non-compressible on the left. He did have vascular studies done in 2015 showing ABIs of 1.4  bilaterally, TBI's of 1.0 on the right 1.1 on the left with triphasic waveforms. He has not had imaging studies that I'm aware of 02/07/17; this is a 82 year old man with type 2 diabetes on oral agents. [place] with a small open area on the medial  aspect of his left great toe. This is in the setting of a significant mycotic nail for which he follows with Dr. Logan Bores of podiatry READMISSION 07/13/2023 This is a now 82 year old man who is had problems with a wound on his right foot amputation site after undergoing a transmetatarsal amputation in May of this year. He was admitted to hospital from 03/29/2023 through 04/02/2023 with sepsis felt to be secondary to an infected wound I think on the right first metatarsal head he underwent a transmetatarsal amputation by podiatry in the hospital. A culture of the wound had previously shown Klebsiella. He was seen in follow-up in the office on 04/19/2023 with a postop wound infection and was given doxycycline and Augmentin for 14 days. He has been dressing the wounds with Betadine wet-to-dry. Looking through epic it would appear that the follow-up with the podiatry group has been sporadic. The patient tells me he was at [facility] skilled facility although he is back at home now. His wound now is actually probably between the fourth and fifth metatarsal head on the amputation site. He has a smaller wound on the dorsal foot in this area and I am sure at 1 time these connected but I could not prove this today. He just came in with kerlix and dry gauze on the wound. He was followed for a period of time by Dr. Durwin Nora of vein and vascular as far as I can see last saw him on 1//24. 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 Last arterial studies I see showed noncompressible ABIs bilaterally but multiphasic waveforms on  the right but a TBI of 0.44 on the left Past medical history includes type 2 diabetes with peripheral neuropathy and PAD, diastolic heart failure, mitral valve stenosis, atrial fibrillation on Xarelto, sleep apnea ABIs were not repeated today in the clinic as they are previously noncompressible as noted His MRI is listed below; This did not suggest osteomyelitis in the area of the right foot where his wound is currently located MRI 03/29/23 IMPRESSION: 1. Deep skin wound about the plantar aspect of the first metatarsal head with mild marrow edema about the plantar aspect of the first metatarsal head concerning for osteomyelitis. 2. No fluid collection or abscess. 3. Postsurgical changes with prior amputation of the right foot through the metatarsophalangeal joints of the first and fifth digit and through metatarsal heads of the second through fourth digits. 4. Edema of the plantar muscles and tendons suggesting diabetic myopathy/myositis. Jason [name], Jason [name] ([id number]) [id number] Page 3 of 11 Electronic Signature(s) Signed: 07/13/2023 5:08:25 PM By: Baltazar Najjar MD Entered By: Baltazar Najjar on 07/13/2023 09:55:20 -------------------------------------------------------------------------------- Physical Exam Details Patient Name: Date of Service: Jason Milian S. 07/13/2023 8:00 A M Medical Record Number: [id number] Patient Account Number: [id number] Date of Birth/Sex: Treating RN: 1941-01-07 (82 y.o. M) Primary Care Provider: [name] Other Clinician: Referring Provider: Treating Provider/Extender: Erasmo Downer in Treatment: 0 Constitutional Sitting or standing Blood Pressure is within target range for patient.. Pulse regular and within target range for patient.Marland Kitchen Respirations regular, non-labored and within target range.. Temperature is normal and within the target range for the patient.Marland Kitchen Appears in no distress. Respiratory work of  breathing is normal. Bilateral breath sounds are clear and equal in all lobes with no wheezes, rales or rhonchi.. Cardiovascular 2 out of 6 pansystolic murmur maximal at the  Foot Wound Laterality: Dorsal, Right, Lateral Cleanser: Soap and Water 3 x Per Week/30 Days Discharge Instructions: May shower and wash wound with dial antibacterial soap and water prior to dressing change. Cleanser: Vashe 5.8 (oz) 3 x Per Week/30 Days Discharge Instructions: Cleanse the wound with Vashe prior to applying a clean dressing using gauze sponges, not tissue or cotton balls. Cleanser: Wound Cleanser 3 x Per Week/30 Days Discharge Instructions: Cleanse the wound with wound cleanser prior to applying a clean dressing using gauze sponges, not tissue or cotton balls. Prim Dressing: Hydrofera Blue Ready Transfer Foam, 2.5x2.5 (in/in) 3 x Per Week/30 Days ary Discharge Instructions: Place HBR into wound and place over wound bed directly Secondary Dressing: Woven Gauze Sponge, Non-Sterile 4x4 in 3 x Per Week/30 Days Discharge Instructions: Apply over primary dressing as directed. Secured With: [employer], 4.5x3.1 (in/yd) 3 x Per Week/30 Days Discharge Instructions: Secure with Kerlix as directed. Electronic Signature(s) Signed: 07/21/2023 4:36:51 AM By: Baltazar Najjar MD Signed: 07/29/2023 8:27:42 AM By: Karie Schwalbe RN Previous Signature: 07/13/2023 5:08:25 PM Version By: Baltazar Najjar MD Previous Signature: 07/14/2023 5:30:58 PM Version By: Karie Schwalbe RN Entered By: Karie Schwalbe on 07/20/2023 15:44:51 -------------------------------------------------------------------------------- Problem List Details Patient Name: Date of Service: Jason Jobs. 07/13/2023 8:00 A M Medical Record Number: [id number] Patient Account Number: [id number] Date of Birth/Sex: Treating RN: November 02, 1941 (82 y.o. M) Primary Care Provider: [name] Other  Clinician: Referring Provider: Treating Provider/Extender: Erasmo Downer in Treatment: [address] [place], [place] Winifred Olive ([id number]) [id number] Page 5 of 11 Active Problems ICD-10 Encounter Code Description Active Date MDM Diagnosis E11.621 Type 2 diabetes mellitus with foot ulcer 07/13/2023 No Yes L97.518 Non-pressure chronic ulcer of other part of right foot with other specified 07/13/2023 No Yes severity E11.51 Type 2 diabetes mellitus with diabetic peripheral angiopathy without gangrene 07/13/2023 No Yes E11.42 Type 2 diabetes mellitus with diabetic polyneuropathy 07/13/2023 No Yes Inactive Problems Resolved Problems Electronic Signature(s) Signed: 07/13/2023 5:08:25 PM By: Baltazar Najjar MD Entered By: Baltazar Najjar on 07/13/2023 09:46:07 -------------------------------------------------------------------------------- Progress Note Details Patient Name: Date of Service: Jason Milian S. 07/13/2023 8:00 A M Medical Record Number: [id number] Patient Account Number: [id number] Date of Birth/Sex: Treating RN: 10/29/1941 (82 y.o. M) Primary Care Provider: [name] Other Clinician: Referring Provider: Treating Provider/Extender: Erasmo Downer in Treatment: 0 Subjective Chief Complaint Information obtained from Patient 01/31/17; patient is here for review of wound on his left great toe 07/13/2023; patient comes back to clinic for review of wounds on his right transmetatarsal amputation site History of Present Illness (HPI) 01/31/17; this is a 82 year old man who has a history of type 2 diabetes on oral agents. He has a history of wound difficulties however this is on his right foot in fact he has had amputations of his medial 3 toes. I did not actually look at the right foot today. He tells me about a month ago he was cutting his mycotic nails informed a wound on the medial aspect of the left great toe. He saw Dr. Logan Bores of  podiatry. He has been using Neosporin for a while and now simply wrapping it with gauze. He states he does have peripheral neuropathy. His hemoglobin A1c in this clinic was 1. 02/21/1989 non-compressible on the left. He did have vascular studies done in 2015 showing ABIs of 1.4 bilaterally, TBI's of 1.0 on the right 1.1 on the left with triphasic waveforms. He has not had imaging studies that I'm  aspect of his left great toe. This is in the setting of a significant mycotic nail for which he follows with Dr. Logan Bores of podiatry READMISSION 07/13/2023 This is a now 82 year old man who is had problems with a wound on his right foot amputation site after undergoing a transmetatarsal amputation in May of this year. He was admitted to hospital from 03/29/2023 through 04/02/2023 with sepsis felt to be secondary to an infected wound I think on the right first metatarsal head he underwent a transmetatarsal amputation by podiatry in the hospital. A culture of the wound had previously shown Klebsiella. He was seen in follow-up in the office on 04/19/2023 with a postop wound infection and was given doxycycline and Augmentin for 14 days. He has been dressing the wounds with Betadine wet-to-dry. Looking through epic it would appear that the follow-up with the podiatry group has been sporadic. The patient tells me he was at [facility] skilled facility although he is back at home now. His wound now is actually probably between the fourth and fifth metatarsal head on the amputation site. He has a smaller wound on the dorsal foot in this area and I am sure at 1 time these connected but I could not prove this today. He just came in with kerlix and dry gauze on the wound. He was followed for a period of time by Dr. Durwin Nora of vein and vascular as far as I can see last saw him on 1//24. 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 Last arterial studies I see showed noncompressible ABIs bilaterally but multiphasic waveforms on  the right but a TBI of 0.44 on the left Past medical history includes type 2 diabetes with peripheral neuropathy and PAD, diastolic heart failure, mitral valve stenosis, atrial fibrillation on Xarelto, sleep apnea ABIs were not repeated today in the clinic as they are previously noncompressible as noted His MRI is listed below; This did not suggest osteomyelitis in the area of the right foot where his wound is currently located MRI 03/29/23 IMPRESSION: 1. Deep skin wound about the plantar aspect of the first metatarsal head with mild marrow edema about the plantar aspect of the first metatarsal head concerning for osteomyelitis. 2. No fluid collection or abscess. 3. Postsurgical changes with prior amputation of the right foot through the metatarsophalangeal joints of the first and fifth digit and through metatarsal heads of the second through fourth digits. 4. Edema of the plantar muscles and tendons suggesting diabetic myopathy/myositis. Jason [name], Jason [name] ([id number]) [id number] Page 3 of 11 Electronic Signature(s) Signed: 07/13/2023 5:08:25 PM By: Baltazar Najjar MD Entered By: Baltazar Najjar on 07/13/2023 09:55:20 -------------------------------------------------------------------------------- Physical Exam Details Patient Name: Date of Service: Jason Milian S. 07/13/2023 8:00 A M Medical Record Number: [id number] Patient Account Number: [id number] Date of Birth/Sex: Treating RN: 1941-01-07 (82 y.o. M) Primary Care Provider: [name] Other Clinician: Referring Provider: Treating Provider/Extender: Erasmo Downer in Treatment: 0 Constitutional Sitting or standing Blood Pressure is within target range for patient.. Pulse regular and within target range for patient.Marland Kitchen Respirations regular, non-labored and within target range.. Temperature is normal and within the target range for the patient.Marland Kitchen Appears in no distress. Respiratory work of  breathing is normal. Bilateral breath sounds are clear and equal in all lobes with no wheezes, rales or rhonchi.. Cardiovascular 2 out of 6 pansystolic murmur maximal at the

## 2023-07-20 ENCOUNTER — Encounter (HOSPITAL_BASED_OUTPATIENT_CLINIC_OR_DEPARTMENT_OTHER): Attending: Physician Assistant | Admitting: Physician Assistant

## 2023-07-20 DIAGNOSIS — E1151 Type 2 diabetes mellitus with diabetic peripheral angiopathy without gangrene: Secondary | ICD-10-CM | POA: Diagnosis not present

## 2023-07-20 DIAGNOSIS — I4891 Unspecified atrial fibrillation: Secondary | ICD-10-CM | POA: Insufficient documentation

## 2023-07-20 DIAGNOSIS — Z87820 Personal history of traumatic brain injury: Secondary | ICD-10-CM | POA: Insufficient documentation

## 2023-07-20 DIAGNOSIS — Z7901 Long term (current) use of anticoagulants: Secondary | ICD-10-CM | POA: Diagnosis not present

## 2023-07-20 DIAGNOSIS — I5032 Chronic diastolic (congestive) heart failure: Secondary | ICD-10-CM | POA: Insufficient documentation

## 2023-07-20 DIAGNOSIS — E11621 Type 2 diabetes mellitus with foot ulcer: Secondary | ICD-10-CM | POA: Insufficient documentation

## 2023-07-20 DIAGNOSIS — E1142 Type 2 diabetes mellitus with diabetic polyneuropathy: Secondary | ICD-10-CM | POA: Insufficient documentation

## 2023-07-20 DIAGNOSIS — L97518 Non-pressure chronic ulcer of other part of right foot with other specified severity: Secondary | ICD-10-CM | POA: Insufficient documentation

## 2023-07-21 ENCOUNTER — Ambulatory Visit (HOSPITAL_COMMUNITY)
Admission: RE | Admit: 2023-07-21 | Discharge: 2023-07-21 | Disposition: A | Discharge status: Home / Self Care | Payer: Medicare Other | Source: Ambulatory Visit | Attending: Physician Assistant | Admitting: Physician Assistant

## 2023-07-21 ENCOUNTER — Other Ambulatory Visit (HOSPITAL_COMMUNITY): Payer: Self-pay | Admitting: Physician Assistant

## 2023-07-21 DIAGNOSIS — B999 Unspecified infectious disease: Secondary | ICD-10-CM

## 2023-07-21 NOTE — Progress Notes (Signed)
Jason [name], Jason [name] ([id number]) [id number] Page 1 of 10 Visit Report for 07/20/2023 Chief Complaint Document Details Patient Name: Date of Service: Jason [name], Jason [name] 07/20/2023 3:15 PM Medical Record Number: [id number] Patient Account Number: [id number] Date of Birth/Sex: Treating RN: February 03, 1941 (82 y.o. M) Primary Care Provider: [name] Other Clinician: Referring Provider: Treating Provider/Extender: Gaynelle Arabian in Treatment: 1 Information Obtained from: Patient Chief Complaint 01/31/17; patient is here for review of wound on his left great toe 07/13/2023; patient comes back to clinic for review of wounds on his right transmetatarsal amputation site Electronic Signature(s) Signed: 07/20/2023 3:48:57 PM By: Allen Derry PA-C Entered By: Allen Derry on 07/20/2023 12:48:57 -------------------------------------------------------------------------------- Debridement Details Patient Name: Date of Service: Jason Jobs. 07/20/2023 3:15 PM Medical Record Number: [id number] Patient Account Number: [id number] Date of Birth/Sex: Treating RN: [birth year] (82 y.o. [name] Primary Care Provider: [name] Other Clinician: Referring Provider: Treating Provider/Extender: Gaynelle Arabian in Treatment: 1 Debridement Performed for Assessment: Wound #6 Right,Lateral,Dorsal Foot Performed By: Physician Jason Kelp, PA Debridement Type: Debridement Severity of Tissue Pre Debridement: Fat layer exposed Level of Consciousness (Pre-procedure): Awake and Alert Pre-procedure Verification/Time Out Yes - 15:55 Taken: Start Time: 15:58 Pain Control: Lidocaine 5% topical ointment Percent of Wound Bed Debrided: 100% T Area Debrided (cm): otal 0.07 Tissue and other material debrided: Non-Viable, Slough, Slough Level: Non-Viable Tissue Debridement Description: Selective/Open Wound Instrument: Curette Bleeding: Minimum Hemostasis  Achieved: Pressure Response to Treatment: Procedure was tolerated well Level of Consciousness (Post- Awake and Alert procedure): Post Debridement Measurements of Total Wound Length: (cm) 0.3 Width: (cm) 0.3 Depth: (cm) 0.3 Volume: (cm) 0.021 Character of Wound/Ulcer Post Debridement: Improved Severity of Tissue Post Debridement: Fat layer exposed Jason [name], Jason [name] ([id number]) [id number] Page 2 of 10 Post Procedure Diagnosis Same as Pre-procedure Electronic Signature(s) Signed: 07/20/2023 5:04:43 PM By: Redmond Pulling RN, BSN Signed: 07/20/2023 6:11:34 PM By: Allen Derry PA-C Entered By: Redmond Pulling on 07/20/2023 13:03:31 -------------------------------------------------------------------------------- Debridement Details Patient Name: Date of Service: Jason Jobs. 07/20/2023 3:15 PM Medical Record Number: [id number] Patient Account Number: [id number] Date of Birth/Sex: Treating RN: 11-02-1941 (82 y.o. M) Primary Care Provider: [name] Other Clinician: Referring Provider: Treating Provider/Extender: Gaynelle Arabian in Treatment: 1 Debridement Performed for Assessment: Wound #5 Right Amputation Site - Transmetatarsal Performed By: Physician Jason Kelp, PA Debridement Type: Debridement Severity of Tissue Pre Debridement: Fat layer exposed Level of Consciousness (Pre-procedure): Awake and Alert Pre-procedure Verification/Time Out Yes - 15:55 Taken: Start Time: 15:58 Pain Control: Lidocaine 5% topical ointment Percent of Wound Bed Debrided: 100% T Area Debrided (cm): otal 1.1 Tissue and other material debrided: Viable, Non-Viable, Slough, Subcutaneous, Slough Level: Skin/Subcutaneous Tissue Debridement Description: Excisional Instrument: Curette Bleeding: Minimum Hemostasis Achieved: Pressure Response to Treatment: Procedure was tolerated well Level of Consciousness (Post- Awake and Alert procedure): Post Debridement  Measurements of Total Wound Length: (cm) 1 Width: (cm) 1.4 Depth: (cm) 1.4 Volume: (cm) 1.539 Character of Wound/Ulcer Post Debridement: Improved Severity of Tissue Post Debridement: Fat layer exposed Post Procedure Diagnosis Same as Pre-procedure Electronic Signature(s) Signed: 07/20/2023 5:52:52 PM By: Allen Derry PA-C Previous Signature: 07/20/2023 5:04:43 PM Version By: Redmond Pulling RN, BSN Entered By: Allen Derry on 07/20/2023 14:52:52 -------------------------------------------------------------------------------- HPI Details Patient Name: Date of Service: Jason Jobs. 07/20/2023 3:15 PM Medical Record Number: [id number] Patient Account Number: [id number] Date of Birth/Sex: Treating RN: [birth year] (82 y.o.  Jason [name], Jason [name] ([id number]) [id number] Page 3 of 10 Primary Care Provider: [name] Other Clinician: Referring Provider: Treating Provider/Extender: Gaynelle Arabian in Treatment: 1 History of Present Illness Chronic/Inactive Conditions Condition 1: He was followed for a period of time by Dr. Durwin Nora of vein and vascular as far as I can see last saw him on 1//24. 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 Last arterial studies I see showed noncompressible ABIs bilaterally but multiphasic waveforms on the right but a TBI of 0.44 on the left HPI Description: 01/31/17; this is a 82 year old man who has a history of type 2 diabetes on oral agents. He has a history of wound difficulties however this is on his right foot in fact he has had amputations of his medial 3 toes. I did not actually look at the right foot today. He tells me about a month ago he was cutting his mycotic nails informed a wound on the medial aspect of the left great toe. He saw Dr. Logan Bores of podiatry. He has been using Neosporin for a  while and now simply wrapping it with gauze. He states he does have peripheral neuropathy. His hemoglobin A1c in this clinic was 1. 02/21/1989 non-compressible on the left. He did have vascular studies done in 2015 showing ABIs of 1.4 bilaterally, TBI's of 1.0 on the right 1.1 on the left with triphasic waveforms. He has not had imaging studies that I'm aware of 02/07/17; this is a 82 year old man with type 2 diabetes on oral agents. [place] week with a small open area on the medial aspect of his left great toe. This is in the setting of a significant mycotic nail for which he follows with Dr. Logan Bores of podiatry READMISSION 07/13/2023 This is a now 82 year old man who is had problems with a wound on his right foot amputation site after undergoing a transmetatarsal amputation in May of this year. He was admitted to hospital from 03/29/2023 through 04/02/2023 with sepsis felt to be secondary to an infected wound I think on the right first metatarsal head he underwent a transmetatarsal amputation by podiatry in the hospital. A culture of the wound had previously shown Klebsiella. He was seen in follow-up in the office on 04/19/2023 with a postop wound infection and was given doxycycline and Augmentin for 14 days. He has been dressing the wounds with Betadine wet-to-dry. Looking through epic it would appear that the follow-up with the podiatry group has been sporadic. The patient tells me he was at [facility] skilled facility although he is back at home now. His wound now is actually probably between the fourth and fifth metatarsal head on the amputation site. He has a smaller wound on the dorsal foot in this area and I am sure at 1 time these connected but I could not prove this today. He just came in with kerlix and dry gauze on the wound. He was followed for a period of time by Dr. Durwin Nora of vein and vascular as far as I can see last saw him on 1//24. At that time he was felt to have  mild infrainguinal arterial occlusive disease but had multiphasic signals in both feet which should have been sufficient to heal wounds. Last arterial studies I see showed noncompressible ABIs bilaterally but multiphasic waveforms on the right but a TBI  of 0.44 on the left Past medical history includes type 2 diabetes with peripheral neuropathy and PAD, diastolic heart failure, mitral valve stenosis, atrial fibrillation on Xarelto, sleep apnea ABIs were not repeated today in the clinic as they are previously noncompressible as noted His MRI is listed below; This did not suggest osteomyelitis in the area of the right foot where his wound is currently located MRI 03/29/23 IMPRESSION: 1. Deep skin wound about the plantar aspect of the first metatarsal head with mild marrow edema about the plantar aspect of the first metatarsal head concerning for osteomyelitis. 2. No fluid collection or abscess. 3. Postsurgical changes with prior amputation of the right foot through the metatarsophalangeal joints of the first and fifth digit and through metatarsal heads of the second through fourth digits. 4. Edema of the plantar muscles and tendons suggesting diabetic myopathy/myositis. 07-20-2023 upon evaluation today patient presents for follow-up evaluation here in the clinic. I did review his note from the last visit with Dr. Leanord Hawking. With that being said he is a current patient of vein and vascular here in [place] and has seen them last it looks like according to Dr. Jannetta Quint note in January of this year. At that point he had multiphasic waveforms on the right with a TBI of 0.44 on the left. I do not see any recent x-rays currently although he has seen podiatry I cannot see any of those x-rays. I really think having him go for a x-ray of the foot would be a good idea. Electronic Signature(s) Signed: 07/20/2023 5:50:16 PM By: Allen Derry PA-C Entered By: Allen Derry on 07/20/2023  14:50:16 -------------------------------------------------------------------------------- Physical Exam Details Patient Name: Date of Service: Jason [name], Jason [name] 07/20/2023 3:15 PM Medical Record Number: [id number] Patient Account Number: [id number] Date of Birth/Sex: Treating RN: June 04, 1941 (82 y.o. M) Primary Care Provider: [name] Other Clinician: Referring Provider: Treating Provider/Extender: Daine Gip, Jason [name] in Treatment: 1 Jason [name], Jason [name] Winifred Olive ([id number]) [id number] Page 4 of 10 Constitutional Well-nourished and well-hydrated in no acute distress. Respiratory normal breathing without difficulty. Psychiatric this patient is able to make decisions and demonstrates good insight into disease process. Alert and Oriented x 3. pleasant and cooperative. Notes Upon inspection patient's wound bed actually showed signs of the need for some sharp debridement I did perform debridement today clearway necrotic debris actually seems to have some good tissue in the base of the wound I do not feel any direct bone exposure but at the same time I do feel like that he has some issues here with drainage and again being so close to the bone and I am concerned about how this is going to proceed. Right now I see no signs of infection and this is somewhat encouraging but at the same time I am very cautious. Electronic Signature(s) Signed: 07/20/2023 5:51:16 PM By: Allen Derry PA-C Entered By: Allen Derry on 07/20/2023 14:51:16 -------------------------------------------------------------------------------- Physician Orders Details Patient Name: Date of Service: Jason Jobs. 07/20/2023 3:15 PM Medical Record Number: [id number] Patient Account Number: [id number] Date of Birth/Sex: Treating RN: 10/05/1941 (82 y.o. [name] Primary Care Provider: [name] Other Clinician: Referring Provider: Treating Provider/Extender: Gaynelle Arabian in Treatment: 1 Verbal / Phone Orders: No Diagnosis Coding ICD-10 Coding Code Description E11.621 Type 2 diabetes mellitus with foot ulcer L97.518 Non-pressure chronic ulcer of other part of right foot with other specified severity E11.51 Type 2 diabetes mellitus with diabetic peripheral angiopathy without gangrene E11.42 Type  2 diabetes mellitus with diabetic polyneuropathy Follow-up Appointments ppointment in 1 week. Allen Derry PA Wednesday 07/27/23 @ 1:15pm Return A Anesthetic (In clinic) Topical Lidocaine 4% applied to wound bed - Used in Clinic Bathing/ Shower/ Hygiene May shower and wash wound with soap and water. - After shower, may change wound dressing if so desired Off-Loading Wound #6 Right,Lateral,Dorsal Foot Other: - Elevate feet when sitting/in bed. Place a pillow behind the knee for comfort. Home Health Wound #6 Right,Lateral,Dorsal Foot New wound care orders this week; continue Home Health for wound care. May utilize formulary equivalent dressing for wound treatment orders unless otherwise specified. - Please change dressing at least twice a week. The 3rd wound change in the week will be done at the Wound care center [facility]) Pack small strips of hydrofera blue into deep parts of the wounds. Other Home Health Orders/Instructions: Rolene Arbour will perform Home Health Wound Treatment Wound #5 - Amputation Site - Transmetatarsal Wound Laterality: Right Cleanser: Soap and Water 3 x Per Week/30 Days Discharge Instructions: May shower and wash wound with dial antibacterial soap and water prior to dressing change. Cleanser: Vashe 5.8 (oz) 3 x Per Week/30 Days Discharge Instructions: Cleanse the wound with Vashe prior to applying a clean dressing using gauze sponges, not tissue or cotton balls. Jason [name], Jason [name] ([id number]) [id number] Page 5 of 10 Cleanser: Wound Cleanser 3 x Per Week/30 Days Discharge Instructions: Cleanse the wound  with wound cleanser prior to applying a clean dressing using gauze sponges, not tissue or cotton balls. Prim Dressing: Hydrofera Blue Ready Transfer Foam, 2.5x2.5 (in/in) 3 x Per Week/30 Days ary Discharge Instructions: Place HBR into wound and place over wound bed directly Secondary Dressing: Woven Gauze Sponge, Non-Sterile 4x4 in 3 x Per Week/30 Days Discharge Instructions: Apply over primary dressing as directed. Secured With: [employer], 4.5x3.1 (in/yd) 3 x Per Week/30 Days Discharge Instructions: Secure with Kerlix as directed. Wound #6 - Foot Wound Laterality: Dorsal, Right, Lateral Cleanser: Soap and Water 3 x Per Week/30 Days Discharge Instructions: May shower and wash wound with dial antibacterial soap and water prior to dressing change. Cleanser: Vashe 5.8 (oz) 3 x Per Week/30 Days Discharge Instructions: Cleanse the wound with Vashe prior to applying a clean dressing using gauze sponges, not tissue or cotton balls. Cleanser: Wound Cleanser 3 x Per Week/30 Days Discharge Instructions: Cleanse the wound with wound cleanser prior to applying a clean dressing using gauze sponges, not tissue or cotton balls. Prim Dressing: Hydrofera Blue Ready Transfer Foam, 2.5x2.5 (in/in) 3 x Per Week/30 Days ary Discharge Instructions: Place HBR into wound and place over wound bed directly Secondary Dressing: Woven Gauze Sponge, Non-Sterile 4x4 in 3 x Per Week/30 Days Discharge Instructions: Apply over primary dressing as directed. Secured With: [employer], 4.5x3.1 (in/yd) 3 x Per Week/30 Days Discharge Instructions: Secure with Kerlix as directed. Radiology X-ray, foot - x-ray of right foot for suspected infection of bone Patient Medications llergies: Bactrim A Notifications Medication Indication Start End 07/20/2023 lidocaine DOSE topical 5 % ointment - ointment topical once daily Electronic Signature(s) Signed: 07/20/2023 5:04:43 PM By: Redmond Pulling RN, BSN Signed:  07/20/2023 6:11:34 PM By: Allen Derry PA-C Entered By: Redmond Pulling on 07/20/2023 13:34:19 Prescription 07/20/2023 -------------------------------------------------------------------------------- Jason [name]. Jason [name] [place] Patient Name: Provider: [birth year] [id number] Date of Birth: NPI#: Judie Petit [id number] Sex: DEA #: [phone] Phone #: License #: [employer] Signs: Patient Address: [address] Eligha Bridegroom [facility] [place], [place] [address]  [address] [place], [place] [address] [phone] Allergies Bactrim Jason [name], Jason [name] ([id number]) [id number] Page 6 of 10 Provider's Orders X-ray, foot - x-ray of right foot for suspected infection of bone Hand Signature: Date(s): Electronic Signature(s) Signed: 07/20/2023 5:04:43 PM By: Redmond Pulling RN, BSN Signed: 07/20/2023 6:11:34 PM By: Allen Derry PA-C Entered By: Redmond Pulling on 07/20/2023 13:34:19 -------------------------------------------------------------------------------- Problem List Details Patient Name: Date of Service: Jason Jobs. 07/20/2023 3:15 PM Medical Record Number: [id number] Patient Account Number: [id number] Date of Birth/Sex: Treating RN: [birth year] (82 y.o. M) Primary Care Provider: [name] Other Clinician: Referring Provider: Treating Provider/Extender: Gaynelle Arabian in Treatment: 1 Active Problems ICD-10 Encounter Code Description Active Date MDM Diagnosis E11.621 Type 2 diabetes mellitus with foot ulcer 07/13/2023 No Yes L97.518 Non-pressure chronic ulcer of other part of right foot with other specified 07/13/2023 No Yes severity E11.51 Type 2 diabetes mellitus with diabetic peripheral angiopathy without gangrene 07/13/2023 No Yes E11.42 Type 2 diabetes mellitus with diabetic polyneuropathy 07/13/2023 No Yes Inactive Problems Resolved Problems Electronic Signature(s) Signed: 07/20/2023 5:04:43 PM By:  Redmond Pulling RN, BSN Signed: 07/20/2023 6:11:34 PM By: Allen Derry PA-C Previous Signature: 07/20/2023 3:48:33 PM Version By: Allen Derry PA-C Entered By: Redmond Pulling on 07/20/2023 13:19:41 -------------------------------------------------------------------------------- Progress Note Details Patient Name: Date of Service: Jason Jobs. 07/20/2023 3:15 PM Jason [name] ([id number]) [id number] Page 7 of 10 Medical Record Number: [id number] Patient Account Number: [id number] Date of Birth/Sex: Treating RN: [birth year] (82 y.o. M) Primary Care Provider: [name] Other Clinician: Referring Provider: Treating Provider/Extender: Gaynelle Arabian in Treatment: 1 Subjective Chief Complaint Information obtained from Patient 01/31/17; patient is here for review of wound on his left great toe 07/13/2023; patient comes back to clinic for review of wounds on his right transmetatarsal amputation site History of Present Illness (HPI) Chronic/Inactive Condition: He was followed for a period of time by Dr. Durwin Nora of vein and vascular as far as I can see last saw him on 1//24. 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 Last arterial studies I see showed noncompressible ABIs bilaterally but multiphasic waveforms on the right but a TBI of 0.44 on the left 01/31/17; this is a 82 year old man who has a history of type 2 diabetes on oral agents. He has a history of wound difficulties however this is on his right foot in fact he has had amputations of his medial 3 toes. I did not actually look at the right foot today. He tells me about a month ago he was cutting his mycotic nails informed a wound on the medial aspect of the left great toe. He saw Dr. Logan Bores of podiatry. He has been using Neosporin for a while and now simply wrapping it with  gauze. He states he does have peripheral neuropathy. His hemoglobin A1c in this clinic was 1. 02/21/1989 non-compressible on the left. He did have vascular studies done in 2015 showing ABIs of 1.4 bilaterally, TBI's of 1.0 on the right 1.1 on the left with triphasic waveforms. He has not had imaging studies that I'm aware of 02/07/17; this is a 82 year old man with type 2 diabetes on oral agents. [place] week with a small open area on the medial aspect of his left great toe. This is in  the setting of a significant mycotic nail for which he follows with Dr. Logan Bores of podiatry READMISSION 07/13/2023 This is a now 82 year old man who is had problems with a wound on his right foot amputation site after undergoing a transmetatarsal amputation in May of this year. He was admitted to hospital from 03/29/2023 through 04/02/2023 with sepsis felt to be secondary to an infected wound I think on the right first metatarsal head he underwent a transmetatarsal amputation by podiatry in the hospital. A culture of the wound had previously shown Klebsiella. He was seen in follow-up in the office on 04/19/2023 with a postop wound infection and was given doxycycline and Augmentin for 14 days. He has been dressing the wounds with Betadine wet-to-dry. Looking through epic it would appear that the follow-up with the podiatry group has been sporadic. The patient tells me he was at [facility] skilled facility although he is back at home now. His wound now is actually probably between the fourth and fifth metatarsal head on the amputation site. He has a smaller wound on the dorsal foot in this area and I am sure at 1 time these connected but I could not prove this today. He just came in with kerlix and dry gauze on the wound. He was followed for a period of time by Dr. Durwin Nora of vein and vascular as far as I can see last saw him on 1//24. At that time he was felt to have mild infrainguinal arterial occlusive  disease but had multiphasic signals in both feet which should have been sufficient to heal wounds. Last arterial studies I see showed noncompressible ABIs bilaterally but multiphasic waveforms on the right but a TBI of 0.44 on the left Past medical history includes type 2 diabetes with peripheral neuropathy and PAD, diastolic heart failure, mitral valve stenosis, atrial fibrillation on Xarelto, sleep apnea ABIs were not repeated today in the clinic as they are previously noncompressible as noted His MRI is listed below; This did not suggest osteomyelitis in the area of the right foot where his wound is currently located MRI 03/29/23 IMPRESSION: 1. Deep skin wound about the plantar aspect of the first metatarsal head with mild marrow edema about the plantar aspect of the first metatarsal head concerning for osteomyelitis. 2. No fluid collection or abscess. 3. Postsurgical changes with prior amputation of the right foot through the metatarsophalangeal joints of the first and fifth digit and through metatarsal heads of the second through fourth digits. 4. Edema of the plantar muscles and tendons suggesting diabetic myopathy/myositis. 07-20-2023 upon evaluation today patient presents for follow-up evaluation here in the clinic. I did review his note from the last visit with Dr. Leanord Hawking. With that being said he is a current patient of vein and vascular here in [place] and has seen them last it looks like according to Dr. Jannetta Quint note in January of this year. At that point he had multiphasic waveforms on the right with a TBI of 0.44 on the left. I do not see any recent x-rays currently although he has seen podiatry I cannot see any of those x-rays. I really think having him go for a x-ray of the foot would be a good idea. Objective Constitutional Well-nourished and well-hydrated in no acute distress. Vitals Time Taken: 3:37 PM, Height: 80 in, Weight: 280 lbs, BMI: 30.8, Temperature: 97.7 F, Pulse:  61 bpm, Respiratory Rate: 16 breaths/min, Blood Pressure: 104/65 mmHg. Respiratory JKAI, ELFMAN ([id number]) [id number] Page 8 of 10 normal breathing without  difficulty. Psychiatric this patient is able to make decisions and demonstrates good insight into disease process. Alert and Oriented x 3. pleasant and cooperative. General Notes: Upon inspection patient's wound bed actually showed signs of the need for some sharp debridement I did perform debridement today clearway necrotic debris actually seems to have some good tissue in the base of the wound I do not feel any direct bone exposure but at the same time I do feel like that he has some issues here with drainage and again being so close to the bone and I am concerned about how this is going to proceed. Right now I see no signs of infection and this is somewhat encouraging but at the same time I am very cautious. Integumentary (Hair, Skin) Wound #5 status is Open. Original cause of wound was Gradually Appeared. The date acquired was: 04/19/2023. The wound has been in treatment 1 weeks. 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 The wound measures 1cm length x 1.4cm width x 1.4cm depth; 1.1cm^2 area and 1.539cm^3 volume. There is Fat Layer (Subcutaneous Tissue) exposed. There is no undermining noted, however, there is tunneling at 3:00 with a maximum distance of 3.1cm. There is a medium amount of serosanguineous drainage noted. There is small (1-33%) pink granulation within the wound bed. There is a large (67-100%) amount of necrotic tissue within the wound bed including Adherent Slough. The periwound skin appearance had no abnormalities noted for moisture. The periwound skin appearance had no abnormalities noted for color. The periwound skin appearance exhibited: Scarring. Periwound temperature was noted as No Abnormality. Wound #6 status is Open. Original cause of wound was Gradually  Appeared. The date acquired was: 04/23/2023. The wound has been in treatment 1 weeks. The wound is located on the Right,Lateral,Dorsal Foot. The wound measures 0.3cm length x 0.3cm width x 0.1cm depth; 0.071cm^2 area and 0.007cm^3 volume. There is Fat Layer (Subcutaneous Tissue) exposed. There is no tunneling or undermining noted. There is a medium amount of sanguinous drainage noted. There is large (67-100%) red granulation within the wound bed. There is a small (1-33%) amount of necrotic tissue within the wound bed including Adherent Slough. The periwound skin appearance exhibited: Dry/Scaly. The periwound skin appearance did not exhibit: Rash, Scarring. Periwound temperature was noted as No Abnormality. Assessment Active Problems ICD-10 Type 2 diabetes mellitus with foot ulcer Non-pressure chronic ulcer of other part of right foot with other specified severity Type 2 diabetes mellitus with diabetic peripheral angiopathy without gangrene Type 2 diabetes mellitus with diabetic polyneuropathy Procedures Wound #5 Pre-procedure diagnosis of Wound #5 is a Diabetic Wound/Ulcer of the Lower Extremity located on the Right Amputation Site - Transmetatarsal .Severity of Tissue Pre Debridement is: Fat layer exposed. There was a Excisional Skin/Subcutaneous Tissue Debridement with a total area of 1.1 sq cm performed by Jason Kelp, PA. With the following instrument(s): Curette to remove Viable and Non-Viable tissue/material. Material removed includes Subcutaneous Tissue and Slough and after achieving pain control using Lidocaine 5% topical ointment. No specimens were taken. A time out was conducted at 15:55, prior to the start of the procedure. A Minimum amount of bleeding was controlled with Pressure. The procedure was tolerated well. Post Debridement Measurements: 1cm length x 1.4cm width x 1.4cm depth; 1.539cm^3 volume. Character of Wound/Ulcer Post Debridement is improved. Severity of Tissue Post  Debridement is: Fat layer exposed. Post procedure Diagnosis Wound #5: Same as Pre-Procedure Wound #6 Pre-procedure diagnosis of Wound #6 is a Diabetic Wound/Ulcer  of the Lower Extremity located on the Right,Lateral,Dorsal Foot .Severity of Tissue Pre Debridement is: Fat layer exposed. There was a Selective/Open Wound Non-Viable Tissue Debridement with a total area of 0.07 sq cm performed by Jason Kelp, PA. With the following instrument(s): Curette to remove Non-Viable tissue/material. Material removed includes [facility] after achieving pain control using Lidocaine 5% topical ointment. No specimens were taken. A time out was conducted at 15:55, prior to the start of the procedure. A Minimum amount of bleeding was controlled with Pressure. The procedure was tolerated well. Post Debridement Measurements: 0.3cm length x 0.3cm width x 0.3cm depth; 0.021cm^3 volume. Character of Wound/Ulcer Post Debridement is improved. Severity of Tissue Post Debridement is: Fat layer exposed. Post procedure Diagnosis Wound #6: Same as Pre-Procedure Plan Follow-up Appointments: Return Appointment in 1 week. Allen Derry PA Wednesday 07/27/23 @ 1:15pm Anesthetic: (In clinic) Topical Lidocaine 4% applied to wound bed - Used in Clinic Bathing/ Shower/ Hygiene: May shower and wash wound with soap and water. - After shower, may change wound dressing if so desired Off-Loading: Wound #6 Right,Lateral,Dorsal Foot: Other: - Elevate feet when sitting/in bed. Place a pillow behind the knee for comfort. Home Health: Wound #6 Right,Lateral,Dorsal Foot: New wound care orders this week; continue Home Health for wound care. May utilize formulary equivalent dressing for wound treatment orders unless otherwise specified. - Please change dressing at least twice a week. The 3rd wound change in the week will be done at the Wound care center [facility]) [address] Jason [name], Jason [name] ([id number]) [id number] Page 9 of  10 small strips of hydrofera blue into deep parts of the wounds. Other Home Health Orders/Instructions: Rolene Arbour will perform Home Health Radiology ordered were: X-ray, foot - x-ray of right foot for suspected infection of bone The following medication(s) was prescribed: lidocaine topical 5 % ointment ointment topical once daily was prescribed at facility WOUND #5: - Amputation Site - Transmetatarsal Wound Laterality: Right Cleanser: Soap and Water 3 x Per Week/30 Days Discharge Instructions: May shower and wash wound with dial antibacterial soap and water prior to dressing change. Cleanser: Vashe 5.8 (oz) 3 x Per Week/30 Days Discharge Instructions: Cleanse the wound with Vashe prior to applying a clean dressing using gauze sponges, not tissue or cotton balls. Cleanser: Wound Cleanser 3 x Per Week/30 Days Discharge Instructions: Cleanse the wound with wound cleanser prior to applying a clean dressing using gauze sponges, not tissue or cotton balls. Prim Dressing: Hydrofera Blue Ready Transfer Foam, 2.5x2.5 (in/in) 3 x Per Week/30 Days ary Discharge Instructions: Place HBR into wound and place over wound bed directly Secondary Dressing: Woven Gauze Sponge, Non-Sterile 4x4 in 3 x Per Week/30 Days Discharge Instructions: Apply over primary dressing as directed. Secured With: [employer], 4.5x3.1 (in/yd) 3 x Per Week/30 Days Discharge Instructions: Secure with Kerlix as directed. WOUND #6: - Foot Wound Laterality: Dorsal, Right, Lateral Cleanser: Soap and Water 3 x Per Week/30 Days Discharge Instructions: May shower and wash wound with dial antibacterial soap and water prior to dressing change. Cleanser: Vashe 5.8 (oz) 3 x Per Week/30 Days Discharge Instructions: Cleanse the wound with Vashe prior to applying a clean dressing using gauze sponges, not tissue or cotton balls. Cleanser: Wound Cleanser 3 x Per Week/30 Days Discharge Instructions: Cleanse the wound with wound cleanser  prior to applying a clean dressing using gauze sponges, not tissue or cotton balls. Prim Dressing: Hydrofera Blue Ready Transfer Foam, 2.5x2.5 (in/in) 3 x Per Week/30 Days ary Discharge Instructions: Place  HBR into wound and place over wound bed directly Secondary Dressing: Woven Gauze Sponge, Non-Sterile 4x4 in 3 x Per Week/30 Days Discharge Instructions: Apply over primary dressing as directed. Secured With: [employer], 4.5x3.1 (in/yd) 3 x Per Week/30 Days Discharge Instructions: Secure with Kerlix as directed. 1. I did explain to the patient that I am cautiously optimistic that we will be able to get things moving in the right direction here. For the time being I would go ahead and recommend that we use the [facility] dressing rather to pack into the wound bed I think this is good to do a good job here. 2. Also can recommend that we have the patient utilize the Zetuvit and roll gauze to secure in place. 3. I am going can recommend he should try to stay off of this is much as possible with the offloading shoe. We will see patient back for reevaluation in 1 week here in the clinic. If anything worsens or changes patient will contact our office for additional recommendations. Electronic Signature(s) Signed: 07/20/2023 5:53:09 PM By: Allen Derry PA-C Previous Signature: 07/20/2023 5:52:11 PM Version By: Allen Derry PA-C Entered By: Allen Derry on 07/20/2023 14:53:09 -------------------------------------------------------------------------------- SuperBill Details Patient Name: Date of Service: Jason Jobs 07/20/2023 Medical Record Number: [id number] Patient Account Number: [id number] Date of Birth/Sex: Treating RN: 1941/03/03 (82 y.o. M) Primary Care Provider: [name] Other Clinician: Referring Provider: Treating Provider/Extender: Gaynelle Arabian in Treatment: 1 Diagnosis Coding ICD-10 Codes Code Description [phone] Type 2 diabetes mellitus with foot  ulcer L97.518 Non-pressure chronic ulcer of other part of right foot with other specified severity E11.51 Type 2 diabetes mellitus with diabetic peripheral angiopathy without gangrene E11.42 Type 2 diabetes mellitus with diabetic polyneuropathy Facility Procedures : TRACY, PASLEY Code: [id number] Baltazar Najjar ([id number] Description: 11042 - DEB SUBQ TISSUE 20 SQ CM/< ICD-10 Diagnosis Description L97.518 Non-pressure chronic ulcer of other part of right foot with other specified severit ) [id number] Page Quantity: 1 10 of 10 : CPT4 Code: [id number] IC L Description: 97 - DEBRIDE WOUND 1ST 20 SQ CM OR < D-10 Diagnosis Description 97.518 Non-pressure chronic ulcer of other part of right foot with other specified severity Modifier: 1 Quantity: Physician Procedures : CPT4 Code Description Modifier [id number] 99213 - WC PHYS LEVEL 3 - EST PT 25 ICD-10 Diagnosis Description E11.621 Type 2 diabetes mellitus with foot ulcer L97.518 Non-pressure chronic ulcer of other part of right foot with other specified severity E11.51  Type 2 diabetes mellitus with diabetic peripheral angiopathy without gangrene E11.42 Type 2 diabetes mellitus with diabetic polyneuropathy Quantity: 1 : [id number] 11042 - WC PHYS SUBQ TISS 20 SQ CM ICD-10 Diagnosis Description L97.518 Non-pressure chronic ulcer of other part of right foot with other specified severity Quantity: 1 : [id number] 97597 - WC PHYS DEBR WO ANESTH 20 SQ CM ICD-10 Diagnosis Description L97.518 Non-pressure chronic ulcer of other part of right foot with other specified severity Quantity: 1 Electronic Signature(s) Signed: 07/20/2023 5:53:29 PM By: Allen Derry PA-C Entered By: Allen Derry on 07/20/2023 14:53:29

## 2023-07-22 ENCOUNTER — Ambulatory Visit: Payer: Medicare HMO | Admitting: Podiatry

## 2023-07-22 NOTE — Progress Notes (Signed)
TORRELL, TERPAK ([id number]) [id number] Page 1 of 7 Visit Report for 07/20/2023 Arrival Information Details Patient Name: Date of Service: Jason [name], Jason [name] 07/20/2023 3:15 PM Medical Record Number: [id number] Patient Account Number: [id number] Date of Birth/Sex: Treating RN: [birth year] (82 y.o. M) Primary Care Jason [name]: Jason [name] Other Clinician: Referring Jason [name]: Treating Jason [name]/Extender: Jason [name] in Treatment: 1 Visit Information History Since Last Visit Added or deleted any medications: No Patient Arrived: Wheel Chair Any new allergies or adverse reactions: No Arrival Time: 15:36 Had a fall or experienced change in No Accompanied By: wife activities of daily living that may affect Transfer Assistance: None risk of falls: Patient Identification Verified: Yes Signs or symptoms of abuse/neglect since last visito No Secondary Verification Process Completed: Yes Hospitalized since last visit: No Patient Has Alerts: Yes Implantable device outside of the clinic excluding No Patient Alerts: ABI R 1.46 (03/29/23) cellular tissue based products placed in the center ABI L 1.63 (03/29/23) since last visit: Has Dressing in Place as Prescribed: Yes Pain Present Now: No Electronic Signature(s) Signed: 07/22/2023 10:06:16 AM By: Jason [name] Entered By: Jason [name] on 07/20/2023 15:37:12 -------------------------------------------------------------------------------- Encounter Discharge Information Details Patient Name: Date of Service: Jason [name]. 07/20/2023 3:15 PM Medical Record Number: [id number] Patient Account Number: [id number] Date of Birth/Sex: Treating RN: [birth year] (82 y.o. [name] Primary Care Jason [name]: Jason [name] Other Clinician: Referring Ariell Gunnels: Treating Jason [name]/Extender: Jason [name] in Treatment: 1 Encounter Discharge Information Items Post Procedure Vitals Discharge  Condition: Stable Temperature (F): 97.7 Ambulatory Status: Ambulatory Pulse (bpm): 61 Discharge Destination: Home Respiratory Rate (breaths/min): 18 Transportation: Private Auto Blood Pressure (mmHg): 104/65 Accompanied By: self Schedule Follow-up Appointment: Yes Clinical Summary of Care: Patient Declined Electronic Signature(s) Signed: 07/20/2023 5:04:43 PM By: Jason Pulling RN, BSN Entered By: Jason [name] on 07/20/2023 16:39:22 Jason [name] ([id number]) [id number] Page 2 of 7 -------------------------------------------------------------------------------- Lower Extremity Assessment Details Patient Name: Date of Service: Jason [name], Jason [name] 07/20/2023 3:15 PM Medical Record Number: [id number] Patient Account Number: [id number] Date of Birth/Sex: Treating RN: 12-10-1940 (82 y.o. [name] Primary Care Jason [name]: Jason [name] Other Clinician: Referring Jason [name]: Treating Jason [name]/Extender: Jason [name] in Treatment: 1 Edema Assessment Assessed: [Left: No] [Right: No] Edema: [Left: N] [Right: o] Calf Left: Right: Point of Measurement: 38 cm From Medial Instep 41.7 cm Ankle Left: Right: Point of Measurement: 11 cm From Medial Instep 27.7 cm Vascular Assessment Pulses: Dorsalis Pedis Palpable: [Right:Yes] Extremity colors, hair growth, and conditions: Extremity Color: [Right:Pale] Hair Growth on Extremity: [Right:No] Temperature of Extremity: [Right:Warm] Dependent Rubor: [Right:No No] Electronic Signature(s) Signed: 07/20/2023 5:04:43 PM By: Jason Pulling RN, BSN Entered By: Jason [name] on 07/20/2023 15:42:29 -------------------------------------------------------------------------------- Multi-Disciplinary Care Plan Details Patient Name: Date of Service: Jason [name]. 07/20/2023 3:15 PM Medical Record Number: [id number] Patient Account Number: [id number] Date of Birth/Sex: Treating RN: 06-Feb-1941 (82 y.o. [name] Primary Care Marckus Hanover: Jason [name] Other Clinician: Referring Sylvan Lahm: Treating Tareq Dwan/Extender: Jason [name] in Treatment: 1 Active Inactive Wound/Skin Impairment Nursing Diagnoses: Impaired tissue integrity Goals: Patient/caregiver will verbalize understanding of skin care regimen Date Initiated: 07/13/2023 Target Resolution Date: 10/15/2023 Goal Status: Active Jason, [name] ([id number]) [id number] Page 3 of 7 Interventions: Assess ulceration(s) every visit Treatment Activities: Skin care regimen initiated : 07/13/2023 Notes: Electronic Signature(s) Signed: 07/20/2023 5:04:43 PM By: Jason Pulling RN, BSN Entered By: Jason [name] on 07/20/2023 15:46:57 --------------------------------------------------------------------------------  Pain Assessment Details Patient Name: Date of Service: Jason [name], Jason [name] 07/20/2023 3:15 PM Medical Record Number: [id number] Patient Account Number: [id number] Date of Birth/Sex: Treating RN: [birth year] (82 y.o. M) Primary Care Lakin Rhine: Jason [name] Other Clinician: Referring Jason [name]: Treating Jason [name]/Extender: Jason [name] in Treatment: 1 Active Problems Location of Pain Severity and Description of Pain Patient Has Paino No Site Locations Pain Management and Medication Current Pain Management: Electronic Signature(s) Signed: 07/22/2023 10:06:16 AM By: Jason [name] Entered By: Jason [name] on 07/20/2023 15:37:37 -------------------------------------------------------------------------------- Patient/Caregiver Education Details Patient Name: Date of Service: Jason [name] 9/4/2024andnbsp3:15 PM Medical Record Number: [id number] Patient Account Number: [id number] Date of Birth/Gender: Treating RN: 06-08-1941 (82 y.o. [name] Primary Care Physician: Jason [name] Other Clinician: [name], Jason [name] ([id number])  [id number] Page 4 of 7 Referring Physician: Treating Physician/Extender: Jason [name] in Treatment: 1 Education Assessment Education Provided To: Patient Education Topics Provided Wound/Skin Impairment: Methods: Explain/Verbal Responses: State content correctly Electronic Signature(s) Signed: 07/20/2023 5:04:43 PM By: Jason Pulling RN, BSN Entered By: Jason [name] on 07/20/2023 15:47:11 -------------------------------------------------------------------------------- Wound Assessment Details Patient Name: Date of Service: Jason [name]. 07/20/2023 3:15 PM Medical Record Number: [id number] Patient Account Number: [id number] Date of Birth/Sex: Treating RN: 18-Apr-1941 (82 y.o. M) Primary Care Caydee Talkington: Jason [name] Other Clinician: Referring Chloris Marcoux: Treating Daekwon Beswick/Extender: Jason [name] in Treatment: 1 Wound Status Wound Number: 5 Primary Diabetic Wound/Ulcer of the Lower Extremity Etiology: Wound Location: Right Amputation Site - Transmetatarsal Wound Open Wounding Event: Gradually Appeared Status: Date Acquired: 04/19/2023 Comorbid Sleep Apnea, Arrhythmia, Congestive Heart Failure, Peripheral [name] Of Treatment: 1 History: Arterial Disease, Peripheral Venous Disease, Type II Diabetes, Clustered Wound: No Neuropathy Photos Wound Measurements Length: (cm) 1 Width: (cm) 1.4 Depth: (cm) 1.4 Area: (cm) 1.1 Volume: (cm) 1.539 % Reduction in Area: 67% % Reduction in Volume: -15.3% Epithelialization: Small (1-33%) Tunneling: Yes Position (o'clock): 3 Maximum Distance: (cm) 3.1 Undermining: No Wound Description Classification: Grade 2 Exudate Amount: Medium Exudate Type: Serosanguineous Exudate Color: red, brown Jason [name], Jason [name] ([id number]) Foul Odor After Cleansing: No Slough/Fibrino Yes [id number].pdf Page 5 of 7 Wound Bed Granulation Amount: Small (1-33%) Exposed  Structure Granulation Quality: Pink Fascia Exposed: No Necrotic Amount: Large (67-100%) Fat Layer (Subcutaneous Tissue) Exposed: Yes Necrotic Quality: Adherent Slough Tendon Exposed: No Muscle Exposed: No Joint Exposed: No Bone Exposed: No Periwound Skin Texture Texture Color No Abnormalities Noted: No No Abnormalities Noted: Yes Scarring: Yes Temperature / Pain Temperature: No Abnormality Moisture No Abnormalities Noted: Yes Treatment Notes Wound #5 (Amputation Site - Transmetatarsal) Wound Laterality: Right Cleanser Soap and Water Discharge Instruction: May shower and wash wound with dial antibacterial soap and water prior to dressing change. Vashe 5.8 (oz) Discharge Instruction: Cleanse the wound with Vashe prior to applying a clean dressing using gauze sponges, not tissue or cotton balls. Wound Cleanser Discharge Instruction: Cleanse the wound with wound cleanser prior to applying a clean dressing using gauze sponges, not tissue or cotton balls. Peri-Wound Care Topical Primary Dressing Hydrofera Blue Ready Transfer Foam, 2.5x2.5 (in/in) Discharge Instruction: Place HBR into wound and place over wound bed directly Secondary Dressing Woven Gauze Sponge, Non-Sterile 4x4 in Discharge Instruction: Apply over primary dressing as directed. Secured With [employer], 4.5x3.1 (in/yd) Discharge Instruction: Secure with Kerlix as directed. Compression Wrap Compression Stockings Add-Ons Electronic Signature(s) Signed: 07/22/2023 10:06:16 AM By: Jason [name] Entered By: Jason [name] on 07/20/2023 15:45:38 -------------------------------------------------------------------------------- Wound Assessment Details  Patient Name: Date of Service: Jason [name], Jason [name] 07/20/2023 3:15 PM Medical Record Number: [id number] Patient Account Number: [id number] Date of Birth/Sex: Treating RN: [birth year] (82 y.o. M) Primary Care Daisee Centner: Jason [name] Other Clinician: Referring  Tevon Berhane: Treating Chirstine Defrain/Extender: Daine Gip, Jason Fila [name] in Treatment: 1 Wound Status Jason [name], Jason [name] ([id number]) [id number] Page 6 of 7 Wound Number: 6 Primary Diabetic Wound/Ulcer of the Lower Extremity Etiology: Wound Location: Right, Lateral, Dorsal Foot Wound Open Wounding Event: Gradually Appeared Status: Date Acquired: 04/23/2023 Comorbid Sleep Apnea, Arrhythmia, Congestive Heart Failure, Peripheral [name] Of Treatment: 1 History: Arterial Disease, Peripheral Venous Disease, Type II Diabetes, Clustered Wound: No Neuropathy Photos Wound Measurements Length: (cm) 0.3 Width: (cm) 0.3 Depth: (cm) 0.1 Area: (cm) 0.071 Volume: (cm) 0.007 % Reduction in Area: 85.9% % Reduction in Volume: 98% Epithelialization: Small (1-33%) Tunneling: No Undermining: No Wound Description Classification: Grade 2 Exudate Amount: Medium Exudate Type: Sanguinous Exudate Color: red Foul Odor After Cleansing: No Slough/Fibrino Yes Wound Bed Granulation Amount: Large (67-100%) Exposed Structure Granulation Quality: Red Fat Layer (Subcutaneous Tissue) Exposed: Yes Necrotic Amount: Small (1-33%) Necrotic Quality: Adherent Slough Periwound Skin Texture Texture Color No Abnormalities Noted: No No Abnormalities Noted: No Rash: No Temperature / Pain Scarring: No Temperature: No Abnormality Moisture No Abnormalities Noted: No Dry / Scaly: Yes Treatment Notes Wound #6 (Foot) Wound Laterality: Dorsal, Right, Lateral Cleanser Soap and Water Discharge Instruction: May shower and wash wound with dial antibacterial soap and water prior to dressing change. Vashe 5.8 (oz) Discharge Instruction: Cleanse the wound with Vashe prior to applying a clean dressing using gauze sponges, not tissue or cotton balls. Wound Cleanser Discharge Instruction: Cleanse the wound with wound cleanser prior to applying a clean dressing using gauze sponges, not tissue or  cotton balls. Peri-Wound Care Topical Primary Dressing Hydrofera Blue Ready Transfer Foam, 2.5x2.5 (in/in) Discharge Instruction: Place HBR into wound and place over wound bed directly Secondary Dressing Woven Gauze Sponge, Non-Sterile 4x4 in Discharge Instruction: Apply over primary dressing as directed. Jason [name], Jason [name] ([id number]) [id number] Page 7 of 7 Secured With [employer], 4.5x3.1 (in/yd) Discharge Instruction: Secure with Kerlix as directed. Compression Wrap Compression Stockings Add-Ons Electronic Signature(s) Signed: 07/22/2023 10:06:16 AM By: Jason [name] Entered By: Jason [name] on 07/20/2023 15:46:31 -------------------------------------------------------------------------------- Vitals Details Patient Name: Date of Service: Jason [name]. 07/20/2023 3:15 PM Medical Record Number: [id number] Patient Account Number: [id number] Date of Birth/Sex: Treating RN: [birth year] (82 y.o. M) Primary Care Amairany Schumpert: Jason [name] Other Clinician: Referring Mikhia Dusek: Treating Virdell Hoiland/Extender: Jason [name] in Treatment: 1 Vital Signs Time Taken: 15:37 Temperature (F): 97.7 Height (in): 80 Pulse (bpm): 61 Weight (lbs): 280 Respiratory Rate (breaths/min): 16 Body Mass Index (BMI): 30.8 Blood Pressure (mmHg): 104/65 Reference Range: 80 - 120 mg / dl Electronic Signature(s) Signed: 07/22/2023 10:06:16 AM By: Jason [name] Entered By: Jason [name] on 07/20/2023 15:37:30

## 2023-07-27 ENCOUNTER — Encounter (HOSPITAL_BASED_OUTPATIENT_CLINIC_OR_DEPARTMENT_OTHER): Admitting: Physician Assistant

## 2023-07-27 DIAGNOSIS — E11621 Type 2 diabetes mellitus with foot ulcer: Secondary | ICD-10-CM | POA: Diagnosis not present

## 2023-07-27 NOTE — Progress Notes (Signed)
Jason [name] ([id number]) [id number] Page 1 of 6 Visit Report for 07/27/2023 Arrival Information Details Patient Name: Date of Service: Jason [name] 07/27/2023 1:15 PM Medical Record Number: [id number] Patient Account Number: [id number] Date of Birth/Sex: Treating RN: 01-24-1941 (82 y.o. M) Primary Care Jason [name]: Jason [name] Other Clinician: Referring Jason [name]: Treating Jason [name]/Extender: Jason [name] in Treatment: 2 Visit Information History Since Last Visit Added or deleted any medications: No Patient Arrived: [name] Any new allergies or adverse reactions: No Arrival Time: 13:20 Had Jason fall or experienced change in No Accompanied By: wife activities of daily living that may affect Transfer Assistance: None risk of falls: Patient Identification Verified: Yes Signs or symptoms of abuse/neglect since last visito No Secondary Verification Process Completed: Yes Hospitalized since last visit: No Patient Has Alerts: Yes Implantable device outside of the clinic excluding No Patient Alerts: ABI R 1.46 (03/29/23) cellular tissue based products placed in the center ABI L 1.63 (03/29/23) since last visit: Has Dressing in Place as Prescribed: Yes Pain Present Now: No Electronic Signature(s) Signed: 07/27/2023 4:43:35 PM By: Jason [name] Entered By: Jason [name] on 07/27/2023 13:21:02 -------------------------------------------------------------------------------- Encounter Discharge Information Details Patient Name: Date of Service: Jason Jobs. 07/27/2023 1:15 PM Medical Record Number: [id number] Patient Account Number: [id number] Date of Birth/Sex: Treating RN: [birth year] (82 y.o. Jason [name] Primary Care Jason [name]: Jason [name] Other Clinician: Referring Julann Mcgilvray: Treating Jason [name]/Extender: Jason [name] in Treatment: 2 Encounter Discharge Information Items Post Procedure Vitals Discharge Condition:  Stable Temperature (F): 98.6 Ambulatory Status: Ambulatory Pulse (bpm): 86 Discharge Destination: Home Respiratory Rate (breaths/min): 18 Transportation: Private Auto Blood Pressure (mmHg): 107/71 Accompanied By: Jason [name] Schedule Follow-up Appointment: Yes Clinical Summary of Care: Patient Declined Electronic Signature(s) Signed: 07/27/2023 2:26:34 PM By: Jason Pulling RN, BSN Entered By: Jason [name] on 07/27/2023 14:18:30 Jason [name] ([id number]) [id number] Page 2 of 6 -------------------------------------------------------------------------------- Lower Extremity Assessment Details Patient Name: Date of Service: Jason [name] 07/27/2023 1:15 PM Medical Record Number: [id number] Patient Account Number: [id number] Date of Birth/Sex: Treating RN: 05/12/1941 (82 y.o. M) Primary Care Canon Gola: Jason [name] Other Clinician: Referring Jason [name]: Treating Jason [name]/Extender: Jason [name] in Treatment: 2 Edema Assessment Assessed: [Left: No] [Right: No] Edema: [Left: N] [Right: o] Calf Left: Right: Point of Measurement: 38 cm From Medial Instep 42 cm Ankle Left: Right: Point of Measurement: 11 cm From Medial Instep 28.5 cm Vascular Assessment Extremity colors, hair growth, and conditions: Extremity Color: [Right:Pale] Hair Growth on Extremity: [Right:No] Temperature of Extremity: [Right:Warm] Dependent Rubor: [Right:No No] Electronic Signature(s) Signed: 07/27/2023 4:43:35 PM By: Jason [name] Entered By: Jason [name] on 07/27/2023 13:29:26 -------------------------------------------------------------------------------- Multi-Disciplinary Care Plan Details Patient Name: Date of Service: Jason [name] S. 07/27/2023 1:15 PM Medical Record Number: [id number] Patient Account Number: [id number] Date of Birth/Sex: Treating RN: 04-25-1941 (82 y.o. Jason [name] Primary Care Sian Joles: Jason [name] Other Clinician: Referring  Deondrea Markos: Treating Jason [name]/Extender: Jason [name] in Treatment: 2 Active Inactive Wound/Skin Impairment Nursing Diagnoses: Impaired tissue integrity Goals: Patient/caregiver will verbalize understanding of skin care regimen Date Initiated: 07/13/2023 Target Resolution Date: 10/15/2023 Goal Status: Active Interventions: Assess ulceration(s) every visit Jason [name] ([id number]) [id number] Page 3 of 6 Treatment Activities: Skin care regimen initiated : 07/13/2023 Notes: Electronic Signature(s) Signed: 07/27/2023 2:26:34 PM By: Jason Pulling RN, BSN Entered By: Jason [name] on 07/27/2023 13:38:25 -------------------------------------------------------------------------------- Pain Assessment Details Patient Name: Date of Service: Jason [name]  Jason [name] 07/27/2023 1:15 PM Medical Record Number: [id number] Patient Account Number: [id number] Date of Birth/Sex: Treating RN: 10-30-1941 (82 y.o. M) Primary Care Izzak Fries: Jason [name] Other Clinician: Referring Tammye Kahler: Treating Jason [name]/Extender: Jason [name] in Treatment: 2 Active Problems Location of Pain Severity and Description of Pain Patient Has Paino No Site Locations Pain Management and Medication Current Pain Management: Electronic Signature(s) Signed: 07/27/2023 4:43:35 PM By: Jason [name] Entered By: Jason [name] on 07/27/2023 13:33:12 -------------------------------------------------------------------------------- Patient/Caregiver Education Details Patient Name: Date of Service: Jason Jobs 9/11/2024andnbsp1:15 PM Medical Record Number: [id number] Patient Account Number: [id number] Date of Birth/Gender: Treating RN: 1941-03-25 (82 y.o. Jason [name] Primary Care Physician: Jason [name] Other Clinician: Referring Physician: Treating Physician/Extender: Jason [name], Criss Rosales in Treatment: 2 Jason [name] ([id number])  [id number] Page 4 of 6 Education Assessment Education Provided To: Patient Education Topics Provided Wound/Skin Impairment: Methods: Explain/Verbal Responses: State content correctly Electronic Signature(s) Signed: 07/27/2023 2:26:34 PM By: Jason Pulling RN, BSN Entered By: Jason [name] on 07/27/2023 13:38:44 -------------------------------------------------------------------------------- Wound Assessment Details Patient Name: Date of Service: Jason Jobs. 07/27/2023 1:15 PM Medical Record Number: [id number] Patient Account Number: [id number] Date of Birth/Sex: Treating RN: [birth year] (82 y.o. Jason [name] Primary Care Inda Mcglothen: Jason [name] Other Clinician: Referring Jonquil Stubbe: Treating Melitta Tigue/Extender: Jason [name] in Treatment: 2 Wound Status Wound Number: 5 Primary Diabetic Wound/Ulcer of the Lower Extremity Etiology: Wound Location: Right Amputation Site - Transmetatarsal Wound Open Wounding Event: Gradually Appeared Status: Date Acquired: 04/19/2023 Comorbid Sleep Apnea, Arrhythmia, Congestive Heart Failure, Peripheral [name] Of Treatment: 2 History: Arterial Disease, Peripheral Venous Disease, Type II Diabetes, Clustered Wound: No Neuropathy Wound Measurements Length: (cm) 1 Width: (cm) 1.4 Depth: (cm) 1.3 Area: (cm) 1.1 Volume: (cm) 1.429 % Reduction in Area: 67% % Reduction in Volume: -7% Epithelialization: Small (1-33%) Tunneling: Yes Position (o'clock): 2 Maximum Distance: (cm) 2.4 Undermining: No Wound Description Classification: Grade 2 Exudate Amount: Medium Exudate Type: Serosanguineous Exudate Color: red, brown Foul Odor After Cleansing: No Slough/Fibrino Yes Wound Bed Granulation Amount: Large (67-100%) Exposed Structure Granulation Quality: Pink Fascia Exposed: No Necrotic Amount: Small (1-33%) Fat Layer (Subcutaneous Tissue) Exposed: Yes Necrotic Quality: Adherent Slough Tendon  Exposed: No Muscle Exposed: No Joint Exposed: No Bone Exposed: No Periwound Skin Texture Texture Color No Abnormalities Noted: No No Abnormalities Noted: Yes Scarring: Yes Temperature / Pain Temperature: No Abnormality Moisture No Abnormalities Noted: [address] KALIK, ZECHER ([id number]) [id number] Page 5 of 6 Treatment Notes Wound #5 (Amputation Site - Transmetatarsal) Wound Laterality: Right Cleanser Soap and Water Discharge Instruction: May shower and wash wound with dial antibacterial soap and water prior to dressing change. Vashe 5.8 (oz) Discharge Instruction: Cleanse the wound with Vashe prior to applying Jason clean dressing using gauze sponges, not tissue or cotton balls. Wound Cleanser Discharge Instruction: Cleanse the wound with wound cleanser prior to applying Jason clean dressing using gauze sponges, not tissue or cotton balls. Peri-Wound Care Topical Primary Dressing Hydrofera Blue Ready Transfer Foam, 2.5x2.5 (in/in) Discharge Instruction: Place HBR into wound and place over wound bed directly Secondary Dressing Woven Gauze Sponge, Non-Sterile 4x4 in Discharge Instruction: Apply over primary dressing as directed. Secured With [employer], 4.5x3.1 (in/yd) Discharge Instruction: Secure with Kerlix as directed. Compression Wrap Compression Stockings Add-Ons Electronic Signature(s) Signed: 07/27/2023 2:26:34 PM By: Jason Pulling RN, BSN Entered By: Jason [name] on 07/27/2023 13:58:49 -------------------------------------------------------------------------------- Wound Assessment Details Patient Name: Date of Service: Jason [name]  MAHKAI, PITONES 07/27/2023 1:15 PM Medical Record Number: [id number] Patient Account Number: [id number] Date of Birth/Sex: Treating RN: March 05, 1941 (82 y.o. M) Primary Care Elizah Lydon: Jason [name] Other Clinician: Referring Tram Wrenn: Treating Mistee Soliman/Extender: Jason [name] in Treatment: 2 Wound  Status Wound Number: 6 Primary Diabetic Wound/Ulcer of the Lower Extremity Etiology: Wound Location: Right, Lateral, Dorsal Foot Wound Healed - Epithelialized Wounding Event: Gradually Appeared Status: Date Acquired: 04/23/2023 Comorbid Sleep Apnea, Arrhythmia, Congestive Heart Failure, Peripheral [name] Of Treatment: 2 History: Arterial Disease, Peripheral Venous Disease, Type II Diabetes, Clustered Wound: No Neuropathy Wound Measurements Length: (cm) Width: (cm) Depth: (cm) Area: (cm) Volume: (cm) 0 % Reduction in Area: 100% 0 % Reduction in Volume: 100% 0 Epithelialization: Large (67-100%) 0 Tunneling: No 0 Undermining: No Wound Description Classification: Grade 2 Cripps, Clemens S ([id number]) Exudate Amount: None Present Foul Odor After Cleansing: No [id number] Page 6 of 6 Slough/Fibrino No Wound Bed Granulation Amount: None Present (0%) Exposed Structure Necrotic Amount: None Present (0%) Fat Layer (Subcutaneous Tissue) Exposed: No Periwound Skin Texture Texture Color No Abnormalities Noted: No No Abnormalities Noted: No Rash: No Temperature / Pain Scarring: No Temperature: No Abnormality Moisture No Abnormalities Noted: No Dry / Scaly: Yes Electronic Signature(s) Signed: 07/27/2023 2:26:34 PM By: Jason Pulling RN, BSN Entered By: Jason [name] on 07/27/2023 13:58:00 -------------------------------------------------------------------------------- Vitals Details Patient Name: Date of Service: Jason [name] S. 07/27/2023 1:15 PM Medical Record Number: [id number] Patient Account Number: [id number] Date of Birth/Sex: Treating RN: Sep 16, 1941 (82 y.o. M) Primary Care Timothy Townsel: Jason [name] Other Clinician: Referring Patrick Sohm: Treating Riese Hellard/Extender: Jason [name] in Treatment: 2 Vital Signs Time Taken: 13:32 Temperature (F): 98.6 Height (in): 80 Pulse (bpm): 86 Weight (lbs): 280 Respiratory Rate (breaths/min):  16 Body Mass Index (BMI): 30.8 Blood Pressure (mmHg): 107/71 Reference Range: 80 - 120 mg / dl Electronic Signature(s) Signed: 07/27/2023 4:43:35 PM By: Jason [name] Entered By: Jason [name] on 07/27/2023 13:33:03

## 2023-07-27 NOTE — Progress Notes (Addendum)
Jason [name] [facility] Wednesday 08/10/23 @ 10:00am Anesthetic: (In clinic) Topical Lidocaine 4% applied to wound bed - Used in Clinic Bathing/ Shower/ Hygiene: May shower and wash wound with soap and water. - After shower, may  change wound dressing if so desired The following medication(s) was prescribed: lidocaine topical 5 % ointment ointment topical once daily was prescribed at facility WOUND #5: - Amputation Site - Transmetatarsal Wound Laterality: Right Cleanser: Soap and Water 3 x Per Week/30 Days Discharge Instructions: May shower and wash wound with dial antibacterial soap and water prior to dressing change. Cleanser: Vashe 5.8 (oz) 3 x Per Week/30 Days Discharge Instructions: Cleanse the wound with Vashe prior to applying a clean dressing using gauze sponges, not tissue or cotton balls. Cleanser: Wound Cleanser 3 x Per Week/30 Days Discharge Instructions: Cleanse the wound with wound cleanser prior to applying a clean dressing using gauze sponges, not tissue or cotton balls. Prim Dressing: Hydrofera Blue Ready Transfer Foam, 2.5x2.5 (in/in) 3 x Per Week/30 Days ary Discharge Instructions: Place HBR into wound and place over wound bed directly Secondary Dressing: Woven Gauze Sponge, Non-Sterile 4x4 in 3 x Per Week/30 Days Discharge Instructions: Apply over primary dressing as directed. Secured With: [employer], 4.5x3.1 (in/yd) 3 x Per Week/30 Days Discharge Instructions: Secure with Kerlix as directed. 1. I would recommend that we have the patient continue to monitor for any signs of infection or worsening. Based on what I am seeing [facility] towards complete closure. 2. I am going to recommend as well that the patient should continue to monitor for any signs of I do believe that we will make worsening overall and if anything changes he knows to contact the office let me know. 3. We will specifically be using the [facility] followed by ABD pad and roll gauze to secure in place. We will see patient back for reevaluation in 1 week here in the clinic. If anything worsens or changes patient will contact our office for additional recommendations. Electronic Signature(s) Signed: 07/29/2023 11:40:57 AM  By: Shawn Stall RN, BSN Signed: 07/29/2023 1:20:16 PM By: Jason Derry PA-C Previous Signature: 07/27/2023 2:02:27 PM Version By: Jason Derry PA-C Entered By: Shawn Stall on 07/29/2023 11:31:00 -------------------------------------------------------------------------------- SuperBill Details Patient Name: Date of Service: Jason [name] 07/27/2023 Medical Record Number: [id number] Patient Account Number: [id number] Date of Birth/Sex: Treating RN: 09/26/1941 (82 y.o. M) Primary Care Provider: [name] Other Clinician: Referring Provider: Treating Provider/Extender: Gaynelle Arabian in Treatment: 2 Diagnosis Coding ICD-10 Codes Code Description [phone] Type 2 diabetes mellitus with foot ulcer L97.518 Non-pressure chronic ulcer of other part of right foot with other specified severity E11.51 Type 2 diabetes mellitus with diabetic peripheral angiopathy without gangrene E11.42 Type 2 diabetes mellitus with diabetic polyneuropathy Facility Procedures : GRIGOR, CANGE Code: [id number] Baltazar Najjar ([id number] L Description: 97597 - DEBRIDE WOUND 1ST 20 SQ CM OR < ICD-10 Diagnosis Description ) [phone] 97.518 Non-pressure chronic ulcer of other part of right foot with other specified severity Modifier: _[email or web address].pdf P Quantity: 1 age 44 of 8 Physician Procedures : CPT4 Code Description Modifier [phone] 97597 - WC PHYS DEBR WO ANESTH 20 SQ CM ICD-10 Diagnosis Description L97.518 Non-pressure chronic ulcer of other part of right foot with other specified severity Quantity: 1 Electronic Signature(s) Signed: 07/27/2023 2:02:50 PM By: Jason Derry PA-C Entered By: Jason [name] on 07/27/2023 14:02:50  Jason [name] [facility] Wednesday 08/10/23 @ 10:00am Anesthetic: (In clinic) Topical Lidocaine 4% applied to wound bed - Used in Clinic Bathing/ Shower/ Hygiene: May shower and wash wound with soap and water. - After shower, may  change wound dressing if so desired The following medication(s) was prescribed: lidocaine topical 5 % ointment ointment topical once daily was prescribed at facility WOUND #5: - Amputation Site - Transmetatarsal Wound Laterality: Right Cleanser: Soap and Water 3 x Per Week/30 Days Discharge Instructions: May shower and wash wound with dial antibacterial soap and water prior to dressing change. Cleanser: Vashe 5.8 (oz) 3 x Per Week/30 Days Discharge Instructions: Cleanse the wound with Vashe prior to applying a clean dressing using gauze sponges, not tissue or cotton balls. Cleanser: Wound Cleanser 3 x Per Week/30 Days Discharge Instructions: Cleanse the wound with wound cleanser prior to applying a clean dressing using gauze sponges, not tissue or cotton balls. Prim Dressing: Hydrofera Blue Ready Transfer Foam, 2.5x2.5 (in/in) 3 x Per Week/30 Days ary Discharge Instructions: Place HBR into wound and place over wound bed directly Secondary Dressing: Woven Gauze Sponge, Non-Sterile 4x4 in 3 x Per Week/30 Days Discharge Instructions: Apply over primary dressing as directed. Secured With: [employer], 4.5x3.1 (in/yd) 3 x Per Week/30 Days Discharge Instructions: Secure with Kerlix as directed. 1. I would recommend that we have the patient continue to monitor for any signs of infection or worsening. Based on what I am seeing [facility] towards complete closure. 2. I am going to recommend as well that the patient should continue to monitor for any signs of I do believe that we will make worsening overall and if anything changes he knows to contact the office let me know. 3. We will specifically be using the [facility] followed by ABD pad and roll gauze to secure in place. We will see patient back for reevaluation in 1 week here in the clinic. If anything worsens or changes patient will contact our office for additional recommendations. Electronic Signature(s) Signed: 07/29/2023 11:40:57 AM  By: Shawn Stall RN, BSN Signed: 07/29/2023 1:20:16 PM By: Jason Derry PA-C Previous Signature: 07/27/2023 2:02:27 PM Version By: Jason Derry PA-C Entered By: Shawn Stall on 07/29/2023 11:31:00 -------------------------------------------------------------------------------- SuperBill Details Patient Name: Date of Service: Jason [name] 07/27/2023 Medical Record Number: [id number] Patient Account Number: [id number] Date of Birth/Sex: Treating RN: 09/26/1941 (82 y.o. M) Primary Care Provider: [name] Other Clinician: Referring Provider: Treating Provider/Extender: Gaynelle Arabian in Treatment: 2 Diagnosis Coding ICD-10 Codes Code Description [phone] Type 2 diabetes mellitus with foot ulcer L97.518 Non-pressure chronic ulcer of other part of right foot with other specified severity E11.51 Type 2 diabetes mellitus with diabetic peripheral angiopathy without gangrene E11.42 Type 2 diabetes mellitus with diabetic polyneuropathy Facility Procedures : GRIGOR, CANGE Code: [id number] Baltazar Najjar ([id number] L Description: 97597 - DEBRIDE WOUND 1ST 20 SQ CM OR < ICD-10 Diagnosis Description ) [phone] 97.518 Non-pressure chronic ulcer of other part of right foot with other specified severity Modifier: _[id number].pdf P Quantity: 1 age 44 of 8 Physician Procedures : CPT4 Code Description Modifier [phone] 97597 - WC PHYS DEBR WO ANESTH 20 SQ CM ICD-10 Diagnosis Description L97.518 Non-pressure chronic ulcer of other part of right foot with other specified severity Quantity: 1 Electronic Signature(s) Signed: 07/27/2023 2:02:50 PM By: Jason Derry PA-C Entered By: Jason [name] on 07/27/2023 14:02:50  Jason [name] [facility] Wednesday 08/10/23 @ 10:00am Anesthetic: (In clinic) Topical Lidocaine 4% applied to wound bed - Used in Clinic Bathing/ Shower/ Hygiene: May shower and wash wound with soap and water. - After shower, may  change wound dressing if so desired The following medication(s) was prescribed: lidocaine topical 5 % ointment ointment topical once daily was prescribed at facility WOUND #5: - Amputation Site - Transmetatarsal Wound Laterality: Right Cleanser: Soap and Water 3 x Per Week/30 Days Discharge Instructions: May shower and wash wound with dial antibacterial soap and water prior to dressing change. Cleanser: Vashe 5.8 (oz) 3 x Per Week/30 Days Discharge Instructions: Cleanse the wound with Vashe prior to applying a clean dressing using gauze sponges, not tissue or cotton balls. Cleanser: Wound Cleanser 3 x Per Week/30 Days Discharge Instructions: Cleanse the wound with wound cleanser prior to applying a clean dressing using gauze sponges, not tissue or cotton balls. Prim Dressing: Hydrofera Blue Ready Transfer Foam, 2.5x2.5 (in/in) 3 x Per Week/30 Days ary Discharge Instructions: Place HBR into wound and place over wound bed directly Secondary Dressing: Woven Gauze Sponge, Non-Sterile 4x4 in 3 x Per Week/30 Days Discharge Instructions: Apply over primary dressing as directed. Secured With: [employer], 4.5x3.1 (in/yd) 3 x Per Week/30 Days Discharge Instructions: Secure with Kerlix as directed. 1. I would recommend that we have the patient continue to monitor for any signs of infection or worsening. Based on what I am seeing [facility] towards complete closure. 2. I am going to recommend as well that the patient should continue to monitor for any signs of I do believe that we will make worsening overall and if anything changes he knows to contact the office let me know. 3. We will specifically be using the [facility] followed by ABD pad and roll gauze to secure in place. We will see patient back for reevaluation in 1 week here in the clinic. If anything worsens or changes patient will contact our office for additional recommendations. Electronic Signature(s) Signed: 07/29/2023 11:40:57 AM  By: Shawn Stall RN, BSN Signed: 07/29/2023 1:20:16 PM By: Jason Derry PA-C Previous Signature: 07/27/2023 2:02:27 PM Version By: Jason Derry PA-C Entered By: Shawn Stall on 07/29/2023 11:31:00 -------------------------------------------------------------------------------- SuperBill Details Patient Name: Date of Service: Jason [name] 07/27/2023 Medical Record Number: [id number] Patient Account Number: [id number] Date of Birth/Sex: Treating RN: 09/26/1941 (82 y.o. M) Primary Care Provider: [name] Other Clinician: Referring Provider: Treating Provider/Extender: Gaynelle Arabian in Treatment: 2 Diagnosis Coding ICD-10 Codes Code Description [phone] Type 2 diabetes mellitus with foot ulcer L97.518 Non-pressure chronic ulcer of other part of right foot with other specified severity E11.51 Type 2 diabetes mellitus with diabetic peripheral angiopathy without gangrene E11.42 Type 2 diabetes mellitus with diabetic polyneuropathy Facility Procedures : GRIGOR, CANGE Code: [id number] Baltazar Najjar ([id number] L Description: 97597 - DEBRIDE WOUND 1ST 20 SQ CM OR < ICD-10 Diagnosis Description ) [phone] 97.518 Non-pressure chronic ulcer of other part of right foot with other specified severity Modifier: _[email or web address].pdf P Quantity: 1 age 44 of 8 Physician Procedures : CPT4 Code Description Modifier [phone] 97597 - WC PHYS DEBR WO ANESTH 20 SQ CM ICD-10 Diagnosis Description L97.518 Non-pressure chronic ulcer of other part of right foot with other specified severity Quantity: 1 Electronic Signature(s) Signed: 07/27/2023 2:02:50 PM By: Jason Derry PA-C Entered By: Jason [name] on 07/27/2023 14:02:50  Jason [name] [facility] Wednesday 08/10/23 @ 10:00am Anesthetic: (In clinic) Topical Lidocaine 4% applied to wound bed - Used in Clinic Bathing/ Shower/ Hygiene: May shower and wash wound with soap and water. - After shower, may  change wound dressing if so desired The following medication(s) was prescribed: lidocaine topical 5 % ointment ointment topical once daily was prescribed at facility WOUND #5: - Amputation Site - Transmetatarsal Wound Laterality: Right Cleanser: Soap and Water 3 x Per Week/30 Days Discharge Instructions: May shower and wash wound with dial antibacterial soap and water prior to dressing change. Cleanser: Vashe 5.8 (oz) 3 x Per Week/30 Days Discharge Instructions: Cleanse the wound with Vashe prior to applying a clean dressing using gauze sponges, not tissue or cotton balls. Cleanser: Wound Cleanser 3 x Per Week/30 Days Discharge Instructions: Cleanse the wound with wound cleanser prior to applying a clean dressing using gauze sponges, not tissue or cotton balls. Prim Dressing: Hydrofera Blue Ready Transfer Foam, 2.5x2.5 (in/in) 3 x Per Week/30 Days ary Discharge Instructions: Place HBR into wound and place over wound bed directly Secondary Dressing: Woven Gauze Sponge, Non-Sterile 4x4 in 3 x Per Week/30 Days Discharge Instructions: Apply over primary dressing as directed. Secured With: [employer], 4.5x3.1 (in/yd) 3 x Per Week/30 Days Discharge Instructions: Secure with Kerlix as directed. 1. I would recommend that we have the patient continue to monitor for any signs of infection or worsening. Based on what I am seeing [facility] towards complete closure. 2. I am going to recommend as well that the patient should continue to monitor for any signs of I do believe that we will make worsening overall and if anything changes he knows to contact the office let me know. 3. We will specifically be using the [facility] followed by ABD pad and roll gauze to secure in place. We will see patient back for reevaluation in 1 week here in the clinic. If anything worsens or changes patient will contact our office for additional recommendations. Electronic Signature(s) Signed: 07/29/2023 11:40:57 AM  By: Shawn Stall RN, BSN Signed: 07/29/2023 1:20:16 PM By: Jason Derry PA-C Previous Signature: 07/27/2023 2:02:27 PM Version By: Jason Derry PA-C Entered By: Shawn Stall on 07/29/2023 11:31:00 -------------------------------------------------------------------------------- SuperBill Details Patient Name: Date of Service: Jason [name] 07/27/2023 Medical Record Number: [id number] Patient Account Number: [id number] Date of Birth/Sex: Treating RN: 09/26/1941 (82 y.o. M) Primary Care Provider: [name] Other Clinician: Referring Provider: Treating Provider/Extender: Gaynelle Arabian in Treatment: 2 Diagnosis Coding ICD-10 Codes Code Description [phone] Type 2 diabetes mellitus with foot ulcer L97.518 Non-pressure chronic ulcer of other part of right foot with other specified severity E11.51 Type 2 diabetes mellitus with diabetic peripheral angiopathy without gangrene E11.42 Type 2 diabetes mellitus with diabetic polyneuropathy Facility Procedures : GRIGOR, CANGE Code: [id number] Baltazar Najjar ([id number] L Description: 97597 - DEBRIDE WOUND 1ST 20 SQ CM OR < ICD-10 Diagnosis Description ) [phone] 97.518 Non-pressure chronic ulcer of other part of right foot with other specified severity Modifier: _[id number].pdf P Quantity: 1 age 44 of 8 Physician Procedures : CPT4 Code Description Modifier [phone] 97597 - WC PHYS DEBR WO ANESTH 20 SQ CM ICD-10 Diagnosis Description L97.518 Non-pressure chronic ulcer of other part of right foot with other specified severity Quantity: 1 Electronic Signature(s) Signed: 07/27/2023 2:02:50 PM By: Jason Derry PA-C Entered By: Jason [name] on 07/27/2023 14:02:50  Non-Sterile 4x4 in 3 x Per Week/30 Days Discharge Instructions: Apply over primary dressing as directed. Secured With: [employer], 4.5x3.1 (in/yd) 3 x Per Week/30 Days Discharge Instructions: Secure with Kerlix as directed. Patient Medications llergies: Bactrim A Notifications Medication Indication Start End 07/27/2023 lidocaine DOSE topical 5 % ointment - ointment topical once daily Electronic Signature(s) Signed: 07/27/2023 2:26:34 PM By: Redmond Pulling RN, BSN Signed: 07/28/2023 5:07:09 PM By: Jason Derry PA-C Entered By: Redmond Pulling on 07/27/2023 14:07:53 -------------------------------------------------------------------------------- Problem List Details Patient Name: Date of Service: Jason [name]. 07/27/2023 1:15 PM Medical Record Number: [id number] Patient Account Number: [id number] Date of Birth/Sex: Treating RN: [birth year] (82 y.o. M) Primary Care Provider: [name] Other Clinician: Referring Provider: Treating Provider/Extender: Gaynelle Arabian in Treatment: 2 Active Problems ICD-10 Encounter Code Description Active Date MDM Diagnosis E11.621 Type 2 diabetes mellitus with foot ulcer 07/13/2023 No Yes L97.518 Non-pressure chronic ulcer of other part of right foot with other specified 07/13/2023 No Yes severity E11.51 Type 2 diabetes mellitus with diabetic peripheral angiopathy without gangrene 07/13/2023 No Yes Jason [name], Jason [name] ([id number]) [phone].pdf Page 5 of 8 E11.42 Type 2 diabetes mellitus with diabetic polyneuropathy 07/13/2023 No Yes Inactive Problems Resolved Problems Electronic Signature(s) Signed: 07/27/2023 1:10:50 PM By: Jason Derry PA-C Entered By: Jason [name] on 07/27/2023 13:10:50 -------------------------------------------------------------------------------- Progress Note Details Patient Name: Date of Service: Jason [name]. 07/27/2023 1:15  PM Medical Record Number: [id number] Patient Account Number: [id number] Date of Birth/Sex: Treating RN: 03/29/1941 (82 y.o. M) Primary Care Provider: [name] Other Clinician: Referring Provider: Treating Provider/Extender: Gaynelle Arabian in Treatment: 2 Subjective Chief Complaint Information obtained from Patient 01/31/17; patient is here for review of wound on his left great toe 07/13/2023; patient comes back to clinic for review of wounds on his right transmetatarsal amputation site History of Present Illness (HPI) Chronic/Inactive Condition: He was followed for a period of time by Dr. Durwin Nora of vein and vascular as far as I can see last saw him on 1//24. 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 Last arterial studies I see showed noncompressible ABIs bilaterally but multiphasic waveforms on the right but a TBI of 0.44 on the left 01/31/17; this is a 82 year old man who has a history of type 2 diabetes on oral agents. He has a history of wound difficulties however this is on his right foot in fact he has had amputations of his medial 3 toes. I did not actually look at the right foot today. He tells me about a month ago he was cutting his mycotic nails informed a wound on the medial aspect of the left great toe. He saw Dr. Logan Bores of podiatry. He has been using Neosporin for a while and now simply wrapping it with gauze. He states he does have peripheral neuropathy. His hemoglobin A1c in this clinic was 1. 02/21/1989 non-compressible on the left. He did have vascular studies done in 2015 showing ABIs of 1.4 bilaterally, TBI's of 1.0 on the right 1.1 on the left with triphasic waveforms. He has not had imaging studies that I'm aware of 02/07/17; this is a 82 year old man with type 2 diabetes on oral agents. [place] with a small open  area on the medial aspect of his left great toe. This is in the setting  Jason [name] [facility] Wednesday 08/10/23 @ 10:00am Anesthetic: (In clinic) Topical Lidocaine 4% applied to wound bed - Used in Clinic Bathing/ Shower/ Hygiene: May shower and wash wound with soap and water. - After shower, may  change wound dressing if so desired The following medication(s) was prescribed: lidocaine topical 5 % ointment ointment topical once daily was prescribed at facility WOUND #5: - Amputation Site - Transmetatarsal Wound Laterality: Right Cleanser: Soap and Water 3 x Per Week/30 Days Discharge Instructions: May shower and wash wound with dial antibacterial soap and water prior to dressing change. Cleanser: Vashe 5.8 (oz) 3 x Per Week/30 Days Discharge Instructions: Cleanse the wound with Vashe prior to applying a clean dressing using gauze sponges, not tissue or cotton balls. Cleanser: Wound Cleanser 3 x Per Week/30 Days Discharge Instructions: Cleanse the wound with wound cleanser prior to applying a clean dressing using gauze sponges, not tissue or cotton balls. Prim Dressing: Hydrofera Blue Ready Transfer Foam, 2.5x2.5 (in/in) 3 x Per Week/30 Days ary Discharge Instructions: Place HBR into wound and place over wound bed directly Secondary Dressing: Woven Gauze Sponge, Non-Sterile 4x4 in 3 x Per Week/30 Days Discharge Instructions: Apply over primary dressing as directed. Secured With: [employer], 4.5x3.1 (in/yd) 3 x Per Week/30 Days Discharge Instructions: Secure with Kerlix as directed. 1. I would recommend that we have the patient continue to monitor for any signs of infection or worsening. Based on what I am seeing [facility] towards complete closure. 2. I am going to recommend as well that the patient should continue to monitor for any signs of I do believe that we will make worsening overall and if anything changes he knows to contact the office let me know. 3. We will specifically be using the [facility] followed by ABD pad and roll gauze to secure in place. We will see patient back for reevaluation in 1 week here in the clinic. If anything worsens or changes patient will contact our office for additional recommendations. Electronic Signature(s) Signed: 07/29/2023 11:40:57 AM  By: Shawn Stall RN, BSN Signed: 07/29/2023 1:20:16 PM By: Jason Derry PA-C Previous Signature: 07/27/2023 2:02:27 PM Version By: Jason Derry PA-C Entered By: Shawn Stall on 07/29/2023 11:31:00 -------------------------------------------------------------------------------- SuperBill Details Patient Name: Date of Service: Jason [name] 07/27/2023 Medical Record Number: [id number] Patient Account Number: [id number] Date of Birth/Sex: Treating RN: 09/26/1941 (82 y.o. M) Primary Care Provider: [name] Other Clinician: Referring Provider: Treating Provider/Extender: Gaynelle Arabian in Treatment: 2 Diagnosis Coding ICD-10 Codes Code Description [phone] Type 2 diabetes mellitus with foot ulcer L97.518 Non-pressure chronic ulcer of other part of right foot with other specified severity E11.51 Type 2 diabetes mellitus with diabetic peripheral angiopathy without gangrene E11.42 Type 2 diabetes mellitus with diabetic polyneuropathy Facility Procedures : GRIGOR, CANGE Code: [id number] Baltazar Najjar ([id number] L Description: 97597 - DEBRIDE WOUND 1ST 20 SQ CM OR < ICD-10 Diagnosis Description ) [phone] 97.518 Non-pressure chronic ulcer of other part of right foot with other specified severity Modifier: _[id number].pdf P Quantity: 1 age 44 of 9 Physician Procedures : CPT4 Code Description Modifier [phone] 97597 - WC PHYS DEBR WO ANESTH 20 SQ CM ICD-10 Diagnosis Description L97.518 Non-pressure chronic ulcer of other part of right foot with other specified severity Quantity: 1 Electronic Signature(s) Signed: 07/27/2023 2:02:50 PM By: Jason Derry PA-C Entered By: Jason [name] on 07/27/2023 14:02:50  Jason [name] [facility] Wednesday 08/10/23 @ 10:00am Anesthetic: (In clinic) Topical Lidocaine 4% applied to wound bed - Used in Clinic Bathing/ Shower/ Hygiene: May shower and wash wound with soap and water. - After shower, may  change wound dressing if so desired The following medication(s) was prescribed: lidocaine topical 5 % ointment ointment topical once daily was prescribed at facility WOUND #5: - Amputation Site - Transmetatarsal Wound Laterality: Right Cleanser: Soap and Water 3 x Per Week/30 Days Discharge Instructions: May shower and wash wound with dial antibacterial soap and water prior to dressing change. Cleanser: Vashe 5.8 (oz) 3 x Per Week/30 Days Discharge Instructions: Cleanse the wound with Vashe prior to applying a clean dressing using gauze sponges, not tissue or cotton balls. Cleanser: Wound Cleanser 3 x Per Week/30 Days Discharge Instructions: Cleanse the wound with wound cleanser prior to applying a clean dressing using gauze sponges, not tissue or cotton balls. Prim Dressing: Hydrofera Blue Ready Transfer Foam, 2.5x2.5 (in/in) 3 x Per Week/30 Days ary Discharge Instructions: Place HBR into wound and place over wound bed directly Secondary Dressing: Woven Gauze Sponge, Non-Sterile 4x4 in 3 x Per Week/30 Days Discharge Instructions: Apply over primary dressing as directed. Secured With: [employer], 4.5x3.1 (in/yd) 3 x Per Week/30 Days Discharge Instructions: Secure with Kerlix as directed. 1. I would recommend that we have the patient continue to monitor for any signs of infection or worsening. Based on what I am seeing Headway towards complete closure. 2. I am going to recommend as well that the patient should continue to monitor for any signs of I do believe that we will make worsening overall and if anything changes he knows to contact the office let me know. 3. We will specifically be using the [facility] followed by ABD pad and roll gauze to secure in place. We will see patient back for reevaluation in 1 week here in the clinic. If anything worsens or changes patient will contact our office for additional recommendations. Electronic Signature(s) Signed: 07/29/2023 11:40:57 AM  By: Shawn Stall RN, BSN Signed: 07/29/2023 1:20:16 PM By: Jason Derry PA-C Previous Signature: 07/27/2023 2:02:27 PM Version By: Jason Derry PA-C Entered By: Shawn Stall on 07/29/2023 11:31:00 -------------------------------------------------------------------------------- SuperBill Details Patient Name: Date of Service: Jason [name] 07/27/2023 Medical Record Number: [id number] Patient Account Number: [id number] Date of Birth/Sex: Treating RN: 09/26/1941 (82 y.o. M) Primary Care Provider: [name] Other Clinician: Referring Provider: Treating Provider/Extender: Gaynelle Arabian in Treatment: 2 Diagnosis Coding ICD-10 Codes Code Description [phone] Type 2 diabetes mellitus with foot ulcer L97.518 Non-pressure chronic ulcer of other part of right foot with other specified severity E11.51 Type 2 diabetes mellitus with diabetic peripheral angiopathy without gangrene E11.42 Type 2 diabetes mellitus with diabetic polyneuropathy Facility Procedures : GRIGOR, CANGE Code: [id number] Baltazar Najjar ([id number] L Description: 97597 - DEBRIDE WOUND 1ST 20 SQ CM OR < ICD-10 Diagnosis Description ) [phone] 97.518 Non-pressure chronic ulcer of other part of right foot with other specified severity Modifier: _[email or web address].pdf P Quantity: 1 age 44 of 9 Physician Procedures : CPT4 Code Description Modifier [phone] 97597 - WC PHYS DEBR WO ANESTH 20 SQ CM ICD-10 Diagnosis Description L97.518 Non-pressure chronic ulcer of other part of right foot with other specified severity Quantity: 1 Electronic Signature(s) Signed: 07/27/2023 2:02:50 PM By: Jason Derry PA-C Entered By: Jason [name] on 07/27/2023 14:02:50

## 2023-08-05 ENCOUNTER — Ambulatory Visit (INDEPENDENT_AMBULATORY_CARE_PROVIDER_SITE_OTHER): Payer: Medicare HMO | Admitting: Podiatry

## 2023-08-05 DIAGNOSIS — L97412 Non-pressure chronic ulcer of right heel and midfoot with fat layer exposed: Secondary | ICD-10-CM

## 2023-08-05 DIAGNOSIS — Z89431 Acquired absence of right foot: Secondary | ICD-10-CM

## 2023-08-05 DIAGNOSIS — E1142 Type 2 diabetes mellitus with diabetic polyneuropathy: Secondary | ICD-10-CM

## 2023-08-05 NOTE — Progress Notes (Signed)
Subjective:  Patient ID: Jason [name], male    DOB: September 06, 1941,  MRN: [id number]  DOS: 03/30/2023 Procedure: Transmetatarsal amputation right foot  82 y.o. male returns for post-op check.  Patient is approximately 4 months from trans met amp of right foot.  Patient seeing wound care.  Reports that the wound seems to be improving.  Weightbearing as tolerated in a postop shoe.  Has wound care via home health nurse who comes out twice a week.  They are doing Hydrofera Blue dressing changes.  Review of Systems: Negative except as noted in the HPI. Denies N/V/F/Ch.   Objective:  There were no vitals filed for this visit. There is no height or weight on file to calculate BMI. Constitutional Well developed. Well nourished.  Vascular Foot warm and well perfused. Capillary refill normal to all digits.  Calf is soft and supple, no posterior calf or knee pain, negative Homans' sign  Neurologic Normal speech. Oriented to person, place, and time. Epicritic sensation to light touch grossly absent to right foot  Dermatologic Distal circular ulceration present along the lateral aspect of the amputation site on the right foot.  There is decreased tunneling from prior.  Wound measures approximately 1 x 1 x 0.5 cm Does not probe to bone.  Healthy granular bleeding tissue at the wound base    Orthopedic: S/p R foot tMA. Nontender to palpation noted about the surgical site secondary to neuropathy.   Multiple view plain film radiographs: Deferred Assessment:   1. DM type 2 with diabetic peripheral neuropathy (HCC)   2. Ulcer of right midfoot with fat layer exposed (HCC)   3. S/P transmetatarsal amputation of foot, right (HCC)       Plan:  Patient was evaluated and treated and all questions answered.  S/p foot surgery right transmetatarsal amputation with ulceration present at the amputation site -Much improved from prior with decreased tunneling wound overall healing and smaller in size than  previously appreciate wound care assistance with wound healing -Patient is currently finished with oral antibiotics.  Hold on further antibiotics at this time will consider future antibiotics if needed if the wound worsens -XR: Deferred at this visit -patient had x-ray on 07/21/2023 that did not show any residual osteomyelitis at the amputation site but did show heterotropic ossification -WB Status: WBAT in post op shoe -Sutures: Previously removed -Wounds debrided to healthy bleeding base as below, removed hyperkeratotic tissue surrounding the forefoot -Medications: Antibiotics as above.  Patient does not have any pain associated with this area -Foot redressed with gauze dressing -Recommend patient continue daily dressing changes with iodoform and gauze daily patient to have iodoform -Continue wound care every 2 weeks  Procedure: Excisional Debridement of Wound Rationale: Removal of non-viable soft tissue from the wound to promote healing.  Anesthesia: none Post-Debridement Wound Measurements: 1 cm x 1 cm x 0.5 cm  Type of Debridement: Sharp Excisional Tissue Removed: Non-viable soft tissue Depth of Debridement: subcutaneous tissue. Technique: Sharp excisional debridement to bleeding, viable wound base.  Dressing: Dry, sterile, compression dressing. Disposition: Patient tolerated procedure well.   Return in about 4 weeks (around 09/02/2023) for Ulcer R TMA site.         Corinna Gab, DPM [facility] / [facility]

## 2023-08-10 ENCOUNTER — Encounter (HOSPITAL_BASED_OUTPATIENT_CLINIC_OR_DEPARTMENT_OTHER): Admitting: Physician Assistant

## 2023-08-10 NOTE — Progress Notes (Signed)
No Abnormalities Noted: Yes Treatment Notes Wound #5 (Amputation Site - Transmetatarsal) Wound Laterality: Right Cleanser Soap and Water Discharge Instruction: May shower and wash wound with dial antibacterial soap and water prior to dressing change. Vashe 5.8 (oz) Discharge Instruction: Cleanse the wound with Vashe prior to applying a clean dressing using gauze sponges, not tissue or cotton balls. Wound Cleanser Discharge Instruction: Cleanse the wound with wound cleanser prior to applying a clean dressing using gauze sponges, not tissue or  cotton balls. Peri-Wound Care Topical Primary Dressing Hydrofera Blue Ready Transfer Foam, 2.5x2.5 (in/in) Discharge Instruction: Cut a piece of HBR slightly smaller than the wound and Place in wound bed. Secondary Dressing Woven Gauze Sponge, Non-Sterile 4x4 in Discharge Instruction: Apply over primary dressing as directed. Secured With [employer], 4.5x3.1 (in/yd) Discharge Instruction: Secure with Kerlix as directed. Compression Wrap Compression Stockings Add-Ons Electronic Signature(s) Signed: 08/10/2023 12:07:31 PM By: Karie Schwalbe RN Entered By: Karie Schwalbe on 08/10/2023 07:26:37 -------------------------------------------------------------------------------- Vitals Details Patient Name: Date of Service: Jason Milian S. 08/10/2023 10:00 A M Medical Record Number: [id number] Patient Account Number: [id number] Date of Birth/Sex: Treating RN: 1940-11-21 (82 y.o. Jason [name] Primary Care Jason [name]: Jason [name] Other Clinician: Referring Jason [name]: Treating Jason [name]/Extender: Jason [name] in Treatment: 4 Vital Signs Time Taken: 10:22 Temperature (F): 97.8 Height (in): 80 Pulse (bpm): 86 Weight (lbs): 280 Respiratory Rate (breaths/min): 16 Body Mass Index (BMI): 30.8 Blood Pressure (mmHg): 105/69 Reference Range: 80 - 120 mg / dl Electronic Signature(s) Signed: 08/10/2023 12:07:31 PM By: Karie Schwalbe RN Entered By: Karie Schwalbe on 08/10/2023 07:35:23  Interventions: Assess ulceration(s) every visit Treatment Activities: Skin care regimen initiated : 07/13/2023 Notes: Electronic Signature(s) Signed: 08/10/2023 12:07:31 PM By: Karie Schwalbe RN Entered By: Karie Schwalbe on 08/10/2023 09:03:07 -------------------------------------------------------------------------------- Pain Assessment Details Patient Name: Date of Service: Jason Jobs. 08/10/2023 10:00 A M Medical Record Number: [id number] Patient Account Number: [id number] Date of Birth/Sex: Treating RN: 1941/10/14 (82 y.o. Jason [name] Primary Care Cledith Abdou: Jason [name] Other Clinician: Referring Danisa Kopec: Treating Destina Mantei/Extender: Jason [name] in Treatment: [address] Jason [name], Jason [name] ([id number]) [id number] Page 5 of 7 Location of Pain Severity and Description of Pain Patient Has Paino Yes Site Locations Pain Location: Generalized Pain With Dressing Change: No Duration of the Pain. Constant / Intermittento Constant Rate the pain. Current Pain Level: 4 Worst Pain Level: 9 Least Pain Level: 3 Tolerable Pain Level: 4 Character of Pain Describe the Pain: Difficult to Pinpoint Pain Management and Medication Current Pain Management: Medication: Yes Cold Application: No Rest: Yes Massage: No Activity: No T.E.N.S.: No Heat Application: No Leg drop or  elevation: No Is the Current Pain Management Adequate: Adequate How does your wound impact your activities of daily livingo Sleep: No Bathing: No Appetite: No Relationship With Others: No Bladder Continence: No Emotions: No Bowel Continence: No Work: No Toileting: No Drive: No Dressing: No Hobbies: No Electronic Signature(s) Signed: 08/10/2023 12:07:31 PM By: Karie Schwalbe RN Entered By: Karie Schwalbe on 08/10/2023 07:35:55 -------------------------------------------------------------------------------- Patient/Caregiver Education Details Patient Name: Date of Service: Jason Jobs 9/25/2024andnbsp10:00 A M Medical Record Number: [id number] Patient Account Number: [id number] Date of Birth/Gender: Treating RN: [birth year] (82 y.o. Jason [name] Primary Care Physician: Jason [name] Other Clinician: Referring Physician: Treating Physician/Extender: Jason [name] in Treatment: 4 Education Assessment Education Provided To: Patient Education Topics Provided Wound/Skin Impairment: Methods: Explain/Verbal Responses: State content correctly Jason [name], Jason [name] ([id number]) [id number] Page 6 of 7 Electronic Signature(s) Signed: 08/10/2023 12:07:31 PM By: Karie Schwalbe RN Entered By: Karie Schwalbe on 08/10/2023 09:03:19 -------------------------------------------------------------------------------- Wound Assessment Details Patient Name: Date of Service: Jason Jobs. 08/10/2023 10:00 A M Medical Record Number: [id number] Patient Account Number: [id number] Date of Birth/Sex: Treating RN: July 02, 1941 (82 y.o. Jason [name] Primary Care Nikko Quast: Jason [name] Other Clinician: Referring Kloie Whiting: Treating Ashby Moskal/Extender: Jason [name] in Treatment: 4 Wound Status Wound Number: 5 Primary Diabetic Wound/Ulcer of the Lower Extremity Etiology: Wound Location: Right Amputation Site -  Transmetatarsal Wound Open Wounding Event: Gradually Appeared Status: Date Acquired: 04/19/2023 Comorbid Sleep Apnea, Arrhythmia, Congestive Heart Failure, Peripheral Weeks Of Treatment: 4 History: Arterial Disease, Peripheral Venous Disease, Type II Diabetes, Clustered Wound: No Neuropathy Photos Wound Measurements Length: (cm) 0.8 Width: (cm) 1.3 Depth: (cm) 1.2 Area: (cm) 0.817 Volume: (cm) 0.98 % Reduction in Area: 75.5% % Reduction in Volume: 26.6% Epithelialization: Small (1-33%) Tunneling: Yes Position (o'clock): 2 Maximum Distance: (cm) 1.5 Wound Description Classification: Grade 2 Wound Margin: Distinct, outline attached Exudate Amount: Medium Exudate Type: Serosanguineous Exudate Color: red, brown Foul Odor After Cleansing: No Slough/Fibrino Yes Wound Bed Granulation Amount: Large (67-100%) Exposed Structure Granulation Quality: Pink, Hyper-granulation, Friable Fascia Exposed: No Necrotic Amount: Small (1-33%) Fat Layer (Subcutaneous Tissue) Exposed: Yes Necrotic Quality: Adherent Slough Tendon Exposed: No Muscle Exposed: No Joint Exposed: No Bone Exposed: No Periwound Skin Texture Texture Color No Abnormalities Noted: No No Abnormalities Noted: Yes Scarring: Yes Temperature / Pain Temperature: No [address] Jason [name], Jason [name] ([id number]) [id number] Page 7 of 7  Interventions: Assess ulceration(s) every visit Treatment Activities: Skin care regimen initiated : 07/13/2023 Notes: Electronic Signature(s) Signed: 08/10/2023 12:07:31 PM By: Karie Schwalbe RN Entered By: Karie Schwalbe on 08/10/2023 09:03:07 -------------------------------------------------------------------------------- Pain Assessment Details Patient Name: Date of Service: Jason Jobs. 08/10/2023 10:00 A M Medical Record Number: [id number] Patient Account Number: [id number] Date of Birth/Sex: Treating RN: 1941/10/14 (82 y.o. Jason [name] Primary Care Cledith Abdou: Jason [name] Other Clinician: Referring Danisa Kopec: Treating Destina Mantei/Extender: Jason [name] in Treatment: [address] Jason [name], Jason [name] ([id number]) [id number] Page 5 of 7 Location of Pain Severity and Description of Pain Patient Has Paino Yes Site Locations Pain Location: Generalized Pain With Dressing Change: No Duration of the Pain. Constant / Intermittento Constant Rate the pain. Current Pain Level: 4 Worst Pain Level: 9 Least Pain Level: 3 Tolerable Pain Level: 4 Character of Pain Describe the Pain: Difficult to Pinpoint Pain Management and Medication Current Pain Management: Medication: Yes Cold Application: No Rest: Yes Massage: No Activity: No T.E.N.S.: No Heat Application: No Leg drop or  elevation: No Is the Current Pain Management Adequate: Adequate How does your wound impact your activities of daily livingo Sleep: No Bathing: No Appetite: No Relationship With Others: No Bladder Continence: No Emotions: No Bowel Continence: No Work: No Toileting: No Drive: No Dressing: No Hobbies: No Electronic Signature(s) Signed: 08/10/2023 12:07:31 PM By: Karie Schwalbe RN Entered By: Karie Schwalbe on 08/10/2023 07:35:55 -------------------------------------------------------------------------------- Patient/Caregiver Education Details Patient Name: Date of Service: Jason Jobs 9/25/2024andnbsp10:00 A M Medical Record Number: [id number] Patient Account Number: [id number] Date of Birth/Gender: Treating RN: [birth year] (82 y.o. Jason [name] Primary Care Physician: Jason [name] Other Clinician: Referring Physician: Treating Physician/Extender: Jason [name] in Treatment: 4 Education Assessment Education Provided To: Patient Education Topics Provided Wound/Skin Impairment: Methods: Explain/Verbal Responses: State content correctly Jason [name], Jason [name] ([id number]) [id number] Page 6 of 7 Electronic Signature(s) Signed: 08/10/2023 12:07:31 PM By: Karie Schwalbe RN Entered By: Karie Schwalbe on 08/10/2023 09:03:19 -------------------------------------------------------------------------------- Wound Assessment Details Patient Name: Date of Service: Jason Jobs. 08/10/2023 10:00 A M Medical Record Number: [id number] Patient Account Number: [id number] Date of Birth/Sex: Treating RN: July 02, 1941 (82 y.o. Jason [name] Primary Care Nikko Quast: Jason [name] Other Clinician: Referring Kloie Whiting: Treating Ashby Moskal/Extender: Jason [name] in Treatment: 4 Wound Status Wound Number: 5 Primary Diabetic Wound/Ulcer of the Lower Extremity Etiology: Wound Location: Right Amputation Site -  Transmetatarsal Wound Open Wounding Event: Gradually Appeared Status: Date Acquired: 04/19/2023 Comorbid Sleep Apnea, Arrhythmia, Congestive Heart Failure, Peripheral Weeks Of Treatment: 4 History: Arterial Disease, Peripheral Venous Disease, Type II Diabetes, Clustered Wound: No Neuropathy Photos Wound Measurements Length: (cm) 0.8 Width: (cm) 1.3 Depth: (cm) 1.2 Area: (cm) 0.817 Volume: (cm) 0.98 % Reduction in Area: 75.5% % Reduction in Volume: 26.6% Epithelialization: Small (1-33%) Tunneling: Yes Position (o'clock): 2 Maximum Distance: (cm) 1.5 Wound Description Classification: Grade 2 Wound Margin: Distinct, outline attached Exudate Amount: Medium Exudate Type: Serosanguineous Exudate Color: red, brown Foul Odor After Cleansing: No Slough/Fibrino Yes Wound Bed Granulation Amount: Large (67-100%) Exposed Structure Granulation Quality: Pink, Hyper-granulation, Friable Fascia Exposed: No Necrotic Amount: Small (1-33%) Fat Layer (Subcutaneous Tissue) Exposed: Yes Necrotic Quality: Adherent Slough Tendon Exposed: No Muscle Exposed: No Joint Exposed: No Bone Exposed: No Periwound Skin Texture Texture Color No Abnormalities Noted: No No Abnormalities Noted: Yes Scarring: Yes Temperature / Pain Temperature: No [address] Jason [name], Jason [name] ([id number]) [id number] Page 7 of 7  No Abnormalities Noted: Yes Treatment Notes Wound #5 (Amputation Site - Transmetatarsal) Wound Laterality: Right Cleanser Soap and Water Discharge Instruction: May shower and wash wound with dial antibacterial soap and water prior to dressing change. Vashe 5.8 (oz) Discharge Instruction: Cleanse the wound with Vashe prior to applying a clean dressing using gauze sponges, not tissue or cotton balls. Wound Cleanser Discharge Instruction: Cleanse the wound with wound cleanser prior to applying a clean dressing using gauze sponges, not tissue or  cotton balls. Peri-Wound Care Topical Primary Dressing Hydrofera Blue Ready Transfer Foam, 2.5x2.5 (in/in) Discharge Instruction: Cut a piece of HBR slightly smaller than the wound and Place in wound bed. Secondary Dressing Woven Gauze Sponge, Non-Sterile 4x4 in Discharge Instruction: Apply over primary dressing as directed. Secured With [employer], 4.5x3.1 (in/yd) Discharge Instruction: Secure with Kerlix as directed. Compression Wrap Compression Stockings Add-Ons Electronic Signature(s) Signed: 08/10/2023 12:07:31 PM By: Karie Schwalbe RN Entered By: Karie Schwalbe on 08/10/2023 07:26:37 -------------------------------------------------------------------------------- Vitals Details Patient Name: Date of Service: Jason Milian S. 08/10/2023 10:00 A M Medical Record Number: [id number] Patient Account Number: [id number] Date of Birth/Sex: Treating RN: 1940-11-21 (82 y.o. Jason [name] Primary Care Jason [name]: Jason [name] Other Clinician: Referring Jason [name]: Treating Jason [name]/Extender: Jason [name] in Treatment: 4 Vital Signs Time Taken: 10:22 Temperature (F): 97.8 Height (in): 80 Pulse (bpm): 86 Weight (lbs): 280 Respiratory Rate (breaths/min): 16 Body Mass Index (BMI): 30.8 Blood Pressure (mmHg): 105/69 Reference Range: 80 - 120 mg / dl Electronic Signature(s) Signed: 08/10/2023 12:07:31 PM By: Karie Schwalbe RN Entered By: Karie Schwalbe on 08/10/2023 07:35:23

## 2023-08-10 NOTE — Progress Notes (Addendum)
in/yd) 3 x Per Week/30 Days Discharge Instructions: Secure with Kerlix as directed. Electronic Signature(s) Signed: 08/10/2023 12:07:31 PM By: Karie Schwalbe RN Signed: 08/10/2023 3:53:17 PM By: Allen Derry PA-C Entered By: Karie Schwalbe on 08/10/2023 10:55:48 Jason [name] ([id number]) [id number]  Page 4 of 7 -------------------------------------------------------------------------------- Problem List Details Patient Name: Date of Service: Jason [name], Jason [name] 08/10/2023 10:00 A M Medical Record Number: [id number] Patient Account Number: [id number] Date of Birth/Sex: Treating RN: [birth year] (82 y.o. M) Primary Care Provider: [name] Other Clinician: Referring Provider: Treating Provider/Extender: Gaynelle Arabian in Treatment: 4 Active Problems ICD-10 Encounter Code Description Active Date MDM Diagnosis E11.621 Type 2 diabetes mellitus with foot ulcer 07/13/2023 No Yes L97.518 Non-pressure chronic ulcer of other part of right foot with other specified 07/13/2023 No Yes severity E11.51 Type 2 diabetes mellitus with diabetic peripheral angiopathy without gangrene 07/13/2023 No Yes E11.42 Type 2 diabetes mellitus with diabetic polyneuropathy 07/13/2023 No Yes Inactive Problems Resolved Problems Electronic Signature(s) Signed: 08/10/2023 10:02:03 AM By: Allen Derry PA-C Entered By: Allen Derry on 08/10/2023 10:02:03 -------------------------------------------------------------------------------- Progress Note Details Patient Name: Date of Service: Jason [name]. 08/10/2023 10:00 A M Medical Record Number: [id number] Patient Account Number: [id number] Date of Birth/Sex: Treating RN: [birth year] (82 y.o. M) Primary Care Provider: [name] Other Clinician: Referring Provider: Treating Provider/Extender: Gaynelle Arabian in Treatment: 4 Subjective Chief Complaint Information obtained from Patient 01/31/17; patient is here for review of wound on his left great toe 07/13/2023; patient comes back to clinic for review of wounds on his right transmetatarsal amputation site History of Present Illness (HPI) Chronic/Inactive Condition: He was followed for a period of time by Dr. Durwin Nora of vein and vascular as far as I can see last saw him on 1//24. 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 Last arterial studies I see showed noncompressible ABIs bilaterally but multiphasic waveforms on the right but a TBI of 0.44 on the left 01/31/17; this is a 82 year old man who has a history of type 2 diabetes on oral agents. He has a history of wound difficulties however this is on his right foot in fact he has had amputations of his medial 3 toes. I did not actually look at the right foot today. He tells me about a month ago he was cutting his mycotic nails informed a wound on the medial aspect of the left great toe. He saw Dr. Logan Bores of podiatry. He has been using Neosporin for a while and now simply wrapping it with gauze. He states he does have peripheral neuropathy. His hemoglobin A1c in this clinic was 1. 02/21/1989 non-compressible on the left. He did have vascular studies done in 2015 showing ABIs of 1.4 bilaterally, TBI's of 1.0 on the right 1.1 on the left with triphasic waveforms. He has not had imaging EDIBERTO, FARRELL ([id number]) [id number] Page 5 of 7 studies that I'm aware of 02/07/17; this is a 82 year old man with type 2 diabetes on oral agents. [place] with a small open area on the medial aspect of his left great toe. This is in the setting of a significant mycotic nail for which he follows with Dr. Logan Bores of podiatry READMISSION 07/13/2023 This is a now 82 year old man who is had problems with a wound on his right foot amputation  dial antibacterial soap and water prior to dressing change. Cleanser: Vashe 5.8 (oz) 3 x Per Week/30 Days Discharge Instructions: Cleanse the wound with Vashe prior to applying a clean dressing using gauze sponges, not tissue or cotton balls. Cleanser: Wound Cleanser 3 x Per Week/30 Days Discharge Instructions: Cleanse the wound with wound cleanser prior to applying a clean dressing using gauze sponges, not tissue or cotton balls. Prim Dressing: Hydrofera Blue Ready Transfer Foam, 2.5x2.5 (in/in) 3 x Per Week/30 Days ary Discharge Instructions: Cut a piece of HBR slightly smaller than the wound and Place in wound bed. Secondary Dressing: Woven Gauze Sponge, Non-Sterile 4x4 in 3 x Per Week/30 Days Discharge Instructions: Apply over primary  dressing as directed. Secured With: [employer], 4.5x3.1 (in/yd) 3 x Per Week/30 Days Discharge Instructions: Secure with Kerlix as directed. 1. I would recommend that we have the patient continue to monitor for any signs of infection or worsening. Overall based on what I am seeing I do believe that we are making really good headway towards complete closure which is great news. 2. I am also can recommend that we have the patient continue to monitor for any evidence of worsening or infection if anything changes he knows to contact the office and let me know. We will see patient back for reevaluation in 1 week here in the clinic. If anything worsens or changes patient will contact our office for additional recommendations. Depending on how things proceed over the next couple weeks we may switch over to collagen when I see him in the next office visit. Electronic Signature(s) Signed: 08/10/2023 11:05:08 AM By: Allen Derry PA-C Entered By: Allen Derry on 08/10/2023 11:05:08 -------------------------------------------------------------------------------- SuperBill Details Patient Name: Date of Service: Jason [name] 08/10/2023 Medical Record Number: [id number] Patient Account Number: [id number] Date of Birth/Sex: Treating RN: 1940-12-23 (82 y.o. M) Primary Care Provider: [name] Other Clinician: Referring Provider: Treating Provider/Extender: Gaynelle Arabian in Treatment: 4 Diagnosis Coding JIMIN, BRISCO ([id number]) [id number] Page 7 of 7 ICD-10 Codes Code Description E11.621 Type 2 diabetes mellitus with foot ulcer L97.518 Non-pressure chronic ulcer of other part of right foot with other specified severity E11.51 Type 2 diabetes mellitus with diabetic peripheral angiopathy without gangrene E11.42 Type 2 diabetes mellitus with diabetic polyneuropathy Physician Procedures : CPT4 Code Description Modifier [id number] - WC PHYS  LEVEL 3 - EST PT ICD-10 Diagnosis Description E11.621 Type 2 diabetes mellitus with foot ulcer L97.518 Non-pressure chronic ulcer of other part of right foot with other specified severity E11.51  Type 2 diabetes mellitus with diabetic peripheral angiopathy without gangrene E11.42 Type 2 diabetes mellitus with diabetic polyneuropathy Quantity: 1 Electronic Signature(s) Signed: 08/10/2023 11:05:20 AM By: Allen Derry PA-C Entered By: Allen Derry on 08/10/2023 11:05:20  Jason [name], Jason [name] ([id number]) [id number] Page 1 of 7 Visit Report for 08/10/2023 Chief Complaint Document Details Patient Name: Date of Service: EIN, Jason [name] 08/10/2023 10:00 A M Medical Record Number: [id number] Patient Account Number: [id number] Date of Birth/Sex: Treating RN: January 14, 1941 (82 y.o. M) Primary Care Provider: [name] Other Clinician: Referring Provider: Treating Provider/Extender: Gaynelle Arabian in Treatment: 4 Information Obtained from: Patient Chief Complaint 01/31/17; patient is here for review of wound on his left great toe 07/13/2023; patient comes back to clinic for review of wounds on his right transmetatarsal amputation site Electronic Signature(s) Signed: 08/10/2023 10:02:15 AM By: Allen Derry PA-C Entered By: Allen Derry on 08/10/2023 10:02:15 -------------------------------------------------------------------------------- HPI Details Patient Name: Date of Service: Jason Milian S. 08/10/2023 10:00 A M Medical Record Number: [id number] Patient Account Number: [id number] Date of Birth/Sex: Treating RN: Jun 25, 1941 (82 y.o. M) Primary Care Provider: [name] Other Clinician: Referring Provider: Treating Provider/Extender: Gaynelle Arabian in Treatment: 4 History of Present Illness Chronic/Inactive Conditions Condition 1: He was followed for a period of time by Dr. Durwin Nora of vein and vascular as far as I can see last saw him on 1//24. 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 Last arterial studies I see showed noncompressible ABIs bilaterally but multiphasic waveforms on the right but a TBI of 0.44 on the left HPI Description: 01/31/17; this is a 83 year old man who has a history of type 2 diabetes on oral agents. He has a history of wound difficulties however this is on  his right foot in fact he has had amputations of his medial 3 toes. I did not actually look at the right foot today. He tells me about a month ago he was cutting his mycotic nails informed a wound on the medial aspect of the left great toe. He saw Dr. Logan Bores of podiatry. He has been using Neosporin for a while and now simply wrapping it with gauze. He states he does have peripheral neuropathy. His hemoglobin A1c in this clinic was 1. 02/21/1989 non-compressible on the left. He did have vascular studies done in 2015 showing ABIs of 1.4 bilaterally, TBI's of 1.0 on the right 1.1 on the left with triphasic waveforms. He has not had imaging studies that I'm aware of 02/07/17; this is a 82 year old man with type 2 diabetes on oral agents. [place] with a small open area on the medial aspect of his left great toe. This is in the setting of a significant mycotic nail for which he follows with Dr. Logan Bores of podiatry READMISSION 07/13/2023 This is a now 82 year old man who is had problems with a wound on his right foot amputation site after undergoing a transmetatarsal amputation in May of this year. He was admitted to hospital from 03/29/2023 through 04/02/2023 with sepsis felt to be secondary to an infected wound I think on the right first metatarsal head he underwent a transmetatarsal amputation by podiatry in the hospital. A culture of the wound had previously shown Klebsiella. He was seen in follow-up in the office on 04/19/2023 with a postop wound infection and was given doxycycline and Augmentin for 14 days. He has been dressing the wounds with Betadine wet-to-dry. Looking through epic it would appear that the follow-up with the podiatry group has been sporadic. The patient tells me  Jason [name], Jason [name] ([id number]) [id number] Page 1 of 7 Visit Report for 08/10/2023 Chief Complaint Document Details Patient Name: Date of Service: EIN, Jason [name] 08/10/2023 10:00 A M Medical Record Number: [id number] Patient Account Number: [id number] Date of Birth/Sex: Treating RN: January 14, 1941 (82 y.o. M) Primary Care Provider: [name] Other Clinician: Referring Provider: Treating Provider/Extender: Gaynelle Arabian in Treatment: 4 Information Obtained from: Patient Chief Complaint 01/31/17; patient is here for review of wound on his left great toe 07/13/2023; patient comes back to clinic for review of wounds on his right transmetatarsal amputation site Electronic Signature(s) Signed: 08/10/2023 10:02:15 AM By: Allen Derry PA-C Entered By: Allen Derry on 08/10/2023 10:02:15 -------------------------------------------------------------------------------- HPI Details Patient Name: Date of Service: Jason Milian S. 08/10/2023 10:00 A M Medical Record Number: [id number] Patient Account Number: [id number] Date of Birth/Sex: Treating RN: Jun 25, 1941 (82 y.o. M) Primary Care Provider: [name] Other Clinician: Referring Provider: Treating Provider/Extender: Gaynelle Arabian in Treatment: 4 History of Present Illness Chronic/Inactive Conditions Condition 1: He was followed for a period of time by Dr. Durwin Nora of vein and vascular as far as I can see last saw him on 1//24. 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 Last arterial studies I see showed noncompressible ABIs bilaterally but multiphasic waveforms on the right but a TBI of 0.44 on the left HPI Description: 01/31/17; this is a 83 year old man who has a history of type 2 diabetes on oral agents. He has a history of wound difficulties however this is on  his right foot in fact he has had amputations of his medial 3 toes. I did not actually look at the right foot today. He tells me about a month ago he was cutting his mycotic nails informed a wound on the medial aspect of the left great toe. He saw Dr. Logan Bores of podiatry. He has been using Neosporin for a while and now simply wrapping it with gauze. He states he does have peripheral neuropathy. His hemoglobin A1c in this clinic was 1. 02/21/1989 non-compressible on the left. He did have vascular studies done in 2015 showing ABIs of 1.4 bilaterally, TBI's of 1.0 on the right 1.1 on the left with triphasic waveforms. He has not had imaging studies that I'm aware of 02/07/17; this is a 82 year old man with type 2 diabetes on oral agents. [place] last week with a small open area on the medial aspect of his left great toe. This is in the setting of a significant mycotic nail for which he follows with Dr. Logan Bores of podiatry READMISSION 07/13/2023 This is a now 82 year old man who is had problems with a wound on his right foot amputation site after undergoing a transmetatarsal amputation in May of this year. He was admitted to hospital from 03/29/2023 through 04/02/2023 with sepsis felt to be secondary to an infected wound I think on the right first metatarsal head he underwent a transmetatarsal amputation by podiatry in the hospital. A culture of the wound had previously shown Klebsiella. He was seen in follow-up in the office on 04/19/2023 with a postop wound infection and was given doxycycline and Augmentin for 14 days. He has been dressing the wounds with Betadine wet-to-dry. Looking through epic it would appear that the follow-up with the podiatry group has been sporadic. The patient tells me  in/yd) 3 x Per Week/30 Days Discharge Instructions: Secure with Kerlix as directed. Electronic Signature(s) Signed: 08/10/2023 12:07:31 PM By: Karie Schwalbe RN Signed: 08/10/2023 3:53:17 PM By: Allen Derry PA-C Entered By: Karie Schwalbe on 08/10/2023 10:55:48 Jason [name] ([id number]) [id number]  Page 4 of 7 -------------------------------------------------------------------------------- Problem List Details Patient Name: Date of Service: Jason [name], Jason [name] 08/10/2023 10:00 A M Medical Record Number: [id number] Patient Account Number: [id number] Date of Birth/Sex: Treating RN: [birth year] (82 y.o. M) Primary Care Provider: [name] Other Clinician: Referring Provider: Treating Provider/Extender: Gaynelle Arabian in Treatment: 4 Active Problems ICD-10 Encounter Code Description Active Date MDM Diagnosis E11.621 Type 2 diabetes mellitus with foot ulcer 07/13/2023 No Yes L97.518 Non-pressure chronic ulcer of other part of right foot with other specified 07/13/2023 No Yes severity E11.51 Type 2 diabetes mellitus with diabetic peripheral angiopathy without gangrene 07/13/2023 No Yes E11.42 Type 2 diabetes mellitus with diabetic polyneuropathy 07/13/2023 No Yes Inactive Problems Resolved Problems Electronic Signature(s) Signed: 08/10/2023 10:02:03 AM By: Allen Derry PA-C Entered By: Allen Derry on 08/10/2023 10:02:03 -------------------------------------------------------------------------------- Progress Note Details Patient Name: Date of Service: Jason [name]. 08/10/2023 10:00 A M Medical Record Number: [id number] Patient Account Number: [id number] Date of Birth/Sex: Treating RN: [birth year] (82 y.o. M) Primary Care Provider: [name] Other Clinician: Referring Provider: Treating Provider/Extender: Gaynelle Arabian in Treatment: 4 Subjective Chief Complaint Information obtained from Patient 01/31/17; patient is here for review of wound on his left great toe 07/13/2023; patient comes back to clinic for review of wounds on his right transmetatarsal amputation site History of Present Illness (HPI) Chronic/Inactive Condition: He was followed for a period of time by Dr. Durwin Nora of vein and vascular as far as I can see last saw him on 1//24. 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 Last arterial studies I see showed noncompressible ABIs bilaterally but multiphasic waveforms on the right but a TBI of 0.44 on the left 01/31/17; this is a 82 year old man who has a history of type 2 diabetes on oral agents. He has a history of wound difficulties however this is on his right foot in fact he has had amputations of his medial 3 toes. I did not actually look at the right foot today. He tells me about a month ago he was cutting his mycotic nails informed a wound on the medial aspect of the left great toe. He saw Dr. Logan Bores of podiatry. He has been using Neosporin for a while and now simply wrapping it with gauze. He states he does have peripheral neuropathy. His hemoglobin A1c in this clinic was 1. 02/21/1989 non-compressible on the left. He did have vascular studies done in 2015 showing ABIs of 1.4 bilaterally, TBI's of 1.0 on the right 1.1 on the left with triphasic waveforms. He has not had imaging EDIBERTO, FARRELL ([id number]) [id number] Page 5 of 7 studies that I'm aware of 02/07/17; this is a 82 year old man with type 2 diabetes on oral agents. He came to [place] last week with a small open area on the medial aspect of his left great toe. This is in the setting of a significant mycotic nail for which he follows with Dr. Logan Bores of podiatry READMISSION 07/13/2023 This is a now 82 year old man who is had problems with a wound on his right foot amputation  Medical Record Number: [id number] Patient Account Number: [id number] Date of Birth/Sex: Treating RN: Apr 08, 1941 (82 y.o. M) Primary Care Provider: [name] Other Clinician: Referring Provider: Treating Provider/Extender: Bess Kinds Weeks in Treatment: 4 Constitutional Well-nourished and well-hydrated in no acute distress. Respiratory normal breathing without difficulty. Psychiatric this patient is able to make decisions and demonstrates good insight into disease process.  Alert and Oriented x 3. pleasant and cooperative. Notes Patient's wound bed actually did not require any sharp debridement today. I did clean this out just with gauze and a sterile Q-tip and there was really nothing more that required sharp debridement which was good news. Overall the wound appears to be doing excellent based on what I see at this time. Electronic Signature(s) Signed: 08/10/2023 11:04:11 AM By: Allen Derry PA-C Entered By: Allen Derry on 08/10/2023 11:04:11 Henningsen, Winifred [name] ([id number]) [id number] Page 3 of 7 -------------------------------------------------------------------------------- Physician Orders Details Patient Name: Date of Service: ULISSES, RABINOWITZ 08/10/2023 10:00 A M Medical Record Number: [id number] Patient Account Number: [id number] Date of Birth/Sex: Treating RN: Apr 22, 1941 (82 y.o. [name] Primary Care Provider: [name] Other Clinician: Referring Provider: Treating Provider/Extender: Gaynelle Arabian in Treatment: 4 Verbal / Phone Orders: No Diagnosis Coding ICD-10 Coding Code Description E11.621 Type 2 diabetes mellitus with foot ulcer L97.518 Non-pressure chronic ulcer of other part of right foot with other specified severity E11.51 Type 2 diabetes mellitus with diabetic peripheral angiopathy without gangrene E11.42 Type 2 diabetes mellitus with diabetic polyneuropathy Follow-up Appointments ppointment in 2 weeks. Allen Derry PA Wednesday Room 7 08/24/23 at 1pm Return A Return appointment in 3 weeks. - Cayton Cuevas STONE PA [facility] (Room 9) 08/31/23 at 11:15am Return appointment in 1 month. - Please make appointment with front desk Anesthetic (In clinic) Topical Lidocaine 4% applied to wound bed - Used in Clinic Bathing/ Shower/ Hygiene May shower and wash wound with soap and water. 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 May utilize formulary equivalent dressing for wound treatment orders unless otherwise specified. - Cut Hydrafera Blue Ready dressing a little smaller than the wound opening and place into the wound bed.Then place gauze,Kerlix tape over wound dressing. Other Home Health Orders/Instructions: - Wound Care by [facility] at least twice a week. Cut Hydrafera Blue Ready dressing a little smaller than the wound opening and place into the wound bed.Then place gauze,Kerlix tape over wound dressing. Wound Treatment Wound #5 - Amputation Site - Transmetatarsal Wound Laterality: Right Cleanser: Soap and Water 3 x Per Week/30 Days Discharge Instructions: May shower and wash wound with dial antibacterial soap and water prior to dressing change. Cleanser: Vashe 5.8 (oz) 3 x Per Week/30 Days Discharge Instructions: Cleanse the wound with Vashe prior to applying a clean dressing using gauze sponges, not tissue or cotton balls. Cleanser: Wound Cleanser 3 x Per Week/30 Days Discharge Instructions: Cleanse the wound with wound cleanser prior to applying a clean dressing using gauze sponges, not tissue or cotton balls. Prim Dressing: Hydrofera Blue Ready Transfer Foam, 2.5x2.5 (in/in) 3 x Per Week/30 Days ary Discharge Instructions: Cut a piece of HBR slightly smaller than the wound and Place in wound bed. Secondary Dressing: Woven Gauze Sponge, Non-Sterile 4x4 in 3 x Per Week/30 Days Discharge Instructions: Apply over primary dressing as directed. Secured With: [employer], 4.5x3.1 (  Medical Record Number: [id number] Patient Account Number: [id number] Date of Birth/Sex: Treating RN: Apr 08, 1941 (82 y.o. M) Primary Care Provider: [name] Other Clinician: Referring Provider: Treating Provider/Extender: Bess Kinds Weeks in Treatment: 4 Constitutional Well-nourished and well-hydrated in no acute distress. Respiratory normal breathing without difficulty. Psychiatric this patient is able to make decisions and demonstrates good insight into disease process.  Alert and Oriented x 3. pleasant and cooperative. Notes Patient's wound bed actually did not require any sharp debridement today. I did clean this out just with gauze and a sterile Q-tip and there was really nothing more that required sharp debridement which was good news. Overall the wound appears to be doing excellent based on what I see at this time. Electronic Signature(s) Signed: 08/10/2023 11:04:11 AM By: Allen Derry PA-C Entered By: Allen Derry on 08/10/2023 11:04:11 Henningsen, Winifred [name] ([id number]) [id number] Page 3 of 7 -------------------------------------------------------------------------------- Physician Orders Details Patient Name: Date of Service: ULISSES, RABINOWITZ 08/10/2023 10:00 A M Medical Record Number: [id number] Patient Account Number: [id number] Date of Birth/Sex: Treating RN: Apr 22, 1941 (82 y.o. [name] Primary Care Provider: [name] Other Clinician: Referring Provider: Treating Provider/Extender: Gaynelle Arabian in Treatment: 4 Verbal / Phone Orders: No Diagnosis Coding ICD-10 Coding Code Description E11.621 Type 2 diabetes mellitus with foot ulcer L97.518 Non-pressure chronic ulcer of other part of right foot with other specified severity E11.51 Type 2 diabetes mellitus with diabetic peripheral angiopathy without gangrene E11.42 Type 2 diabetes mellitus with diabetic polyneuropathy Follow-up Appointments ppointment in 2 weeks. Allen Derry PA Wednesday Room 7 08/24/23 at 1pm Return A Return appointment in 3 weeks. - Cayton Cuevas STONE PA [facility] (Room 9) 08/31/23 at 11:15am Return appointment in 1 month. - Please make appointment with front desk Anesthetic (In clinic) Topical Lidocaine 4% applied to wound bed - Used in Clinic Bathing/ Shower/ Hygiene May shower and wash wound with soap and water. 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 May utilize formulary equivalent dressing for wound treatment orders unless otherwise specified. - Cut Hydrafera Blue Ready dressing a little smaller than the wound opening and place into the wound bed.Then place gauze,Kerlix tape over wound dressing. Other Home Health Orders/Instructions: - Wound Care by [facility] at least twice a week. Cut Hydrafera Blue Ready dressing a little smaller than the wound opening and place into the wound bed.Then place gauze,Kerlix tape over wound dressing. Wound Treatment Wound #5 - Amputation Site - Transmetatarsal Wound Laterality: Right Cleanser: Soap and Water 3 x Per Week/30 Days Discharge Instructions: May shower and wash wound with dial antibacterial soap and water prior to dressing change. Cleanser: Vashe 5.8 (oz) 3 x Per Week/30 Days Discharge Instructions: Cleanse the wound with Vashe prior to applying a clean dressing using gauze sponges, not tissue or cotton balls. Cleanser: Wound Cleanser 3 x Per Week/30 Days Discharge Instructions: Cleanse the wound with wound cleanser prior to applying a clean dressing using gauze sponges, not tissue or cotton balls. Prim Dressing: Hydrofera Blue Ready Transfer Foam, 2.5x2.5 (in/in) 3 x Per Week/30 Days ary Discharge Instructions: Cut a piece of HBR slightly smaller than the wound and Place in wound bed. Secondary Dressing: Woven Gauze Sponge, Non-Sterile 4x4 in 3 x Per Week/30 Days Discharge Instructions: Apply over primary dressing as directed. Secured With: [employer], 4.5x3.1 (

## 2023-08-24 ENCOUNTER — Encounter (HOSPITAL_BASED_OUTPATIENT_CLINIC_OR_DEPARTMENT_OTHER): Payer: Medicare HMO | Attending: Physician Assistant | Admitting: Physician Assistant

## 2023-08-24 DIAGNOSIS — G4730 Sleep apnea, unspecified: Secondary | ICD-10-CM | POA: Diagnosis not present

## 2023-08-24 DIAGNOSIS — Z7901 Long term (current) use of anticoagulants: Secondary | ICD-10-CM | POA: Diagnosis not present

## 2023-08-24 DIAGNOSIS — L97518 Non-pressure chronic ulcer of other part of right foot with other specified severity: Secondary | ICD-10-CM | POA: Diagnosis not present

## 2023-08-24 DIAGNOSIS — E1151 Type 2 diabetes mellitus with diabetic peripheral angiopathy without gangrene: Secondary | ICD-10-CM | POA: Diagnosis not present

## 2023-08-24 DIAGNOSIS — E1142 Type 2 diabetes mellitus with diabetic polyneuropathy: Secondary | ICD-10-CM | POA: Insufficient documentation

## 2023-08-24 DIAGNOSIS — E11621 Type 2 diabetes mellitus with foot ulcer: Secondary | ICD-10-CM | POA: Insufficient documentation

## 2023-08-24 DIAGNOSIS — Z89421 Acquired absence of other right toe(s): Secondary | ICD-10-CM | POA: Diagnosis not present

## 2023-08-24 DIAGNOSIS — I5032 Chronic diastolic (congestive) heart failure: Secondary | ICD-10-CM | POA: Diagnosis not present

## 2023-08-24 DIAGNOSIS — Z89411 Acquired absence of right great toe: Secondary | ICD-10-CM | POA: Diagnosis not present

## 2023-08-24 DIAGNOSIS — I4891 Unspecified atrial fibrillation: Secondary | ICD-10-CM | POA: Diagnosis not present

## 2023-08-24 NOTE — Progress Notes (Signed)
point he is in agreement with that plan. Fortunately I do not see any evidence of infection or worsening overall and I am pleased in that regard. Electronic Signature(s) Signed: 08/24/2023 2:29:30 PM By: Allen Derry PA-C Entered By: Allen Derry on 08/24/2023 11:29:30 -------------------------------------------------------------------------------- Physical Exam Details Patient Name: Date of Service: Jason [name], Jason [name] 08/24/2023 1:00 PM Medical Record Number: [id number] Patient Account Number: [id number] Date of Birth/Sex: Treating RN: [birth year] (82 y.o. M) Primary Care Provider:  [name] Other Clinician: Referring Provider: Treating Provider/Extender: Gaynelle Arabian in Treatment: 6 Constitutional Well-nourished and well-hydrated in no acute distress. Respiratory normal breathing without difficulty. Psychiatric this patient is able to make decisions and demonstrates good insight into disease process. Alert and Oriented x 3. pleasant and cooperative. Notes Upon inspection patient's wound bed showed signs of good granulation epithelization at this point. Fortunately I see no signs of active infection which is great news and in general I do believe that we will make an excellent headway towards closure. I am extremely pleased with where we stand currently. Electronic Signature(s) Signed: 08/24/2023 2:29:49 PM By: Allen Derry PA-C Entered By: Allen Derry on 08/24/2023 11:29:48 [name] ([id number]) [id number] Page 3 of 7 -------------------------------------------------------------------------------- Physician Orders Details Patient Name: Date of Service: Jason [name], Jason [name] 08/24/2023 1:00 PM Medical Record Number: [id number] Patient Account Number: [id number] Date of Birth/Sex: Treating RN: 13-Sep-1941 (82 y.o. Tammy Sours Primary Care Provider: [name] Other Clinician: Referring Provider: Treating Provider/Extender: Gaynelle Arabian in Treatment: 6 The following information was scribed by: Shawn Stall The information was scribed for: Lenda Kelp Verbal / Phone Orders: No Diagnosis Coding ICD-10 Coding Code Description E11.621 Type 2 diabetes mellitus with foot ulcer L97.518 Non-pressure chronic ulcer of other part of right foot with other specified severity E11.51 Type 2 diabetes mellitus with diabetic peripheral angiopathy without gangrene E11.42 Type 2 diabetes mellitus with diabetic polyneuropathy Follow-up Appointments ppointment in 1 week. - Rollie Hynek [facility]  (Room 9) 08/31/23 at 11:15am Return A ppointment in 2 weeks. - Marcella Charlson STONE PA [facility] (Room 9) 09/07/23 at 11:15am Return A Return appointment in 3 weeks. - Shayden Gingrich STONE PA [facility] (Room 9) 09/14/23 at 11:15am Return appointment in 1 month. - Please make appointment with front desk Anesthetic (In clinic) Topical Lidocaine 4% applied to wound bed - Used in Clinic Bathing/ Shower/ Hygiene May shower and wash wound with soap and water. - After shower, may change wound dressing if so desired Home Health Wound #5 Right Amputation Site - Transmetatarsal New wound care orders this week; continue Home Health for wound care. May utilize formulary equivalent dressing for wound treatment orders unless otherwise specified. - use endoform as primary dressing place into the wound bed.Then place gauze,Kerlix tape over wound dressing. Other Home Health Orders/Instructions: - Wound Care by [facility] at least twice a week. Wound Treatment Wound #5 - Amputation Site - Transmetatarsal Wound Laterality: Right Cleanser: Soap and Water 3 x Per Week/30 Days Discharge Instructions: May shower and wash wound with dial antibacterial soap and water prior to dressing change. Cleanser: Vashe 5.8 (oz) 3 x Per Week/30 Days Discharge Instructions: Cleanse the wound with Vashe prior to applying a clean dressing using gauze sponges, not tissue or cotton balls. Cleanser: Wound Cleanser 3 x Per Week/30 Days Discharge Instructions: Cleanse the wound with wound cleanser prior to applying a clean dressing using gauze sponges, not tissue or cotton balls. Prim Dressing:  continue Home Health for wound care. May utilize formulary equivalent dressing for wound treatment orders unless otherwise specified. - use endoform as primary dressing place into the wound bed.Then place gauze,Kerlix tape over wound dressing. Other Home Health Orders/Instructions: - Wound Care by [facility] at least twice a week. WOUND #5: - Amputation Site - Transmetatarsal Wound Laterality: Right Cleanser: Soap and Water 3 x Per Week/30 Days Discharge Instructions: May shower and wash wound with dial antibacterial soap and water prior to dressing change. Cleanser: Vashe 5.8 (oz) 3 x Per Week/30 Days Discharge Instructions: Cleanse the wound with Vashe prior to applying a clean dressing using gauze sponges, not tissue or cotton balls. Cleanser: Wound Cleanser 3 x Per Week/30 Days Discharge  Instructions: Cleanse the wound with wound cleanser prior to applying a clean dressing using gauze sponges, not tissue or cotton balls. Prim Dressing: Endoform 2x2 in 3 x Per Week/30 Days ary Discharge Instructions: Moisten with saline Secondary Dressing: Woven Gauze Sponge, Non-Sterile 4x4 in 3 x Per Week/30 Days Discharge Instructions: Apply over primary dressing as directed. Secured With: [employer], 4.5x3.1 (in/yd) 3 x Per Week/30 Days Discharge Instructions: Secure with Kerlix as directed. 1. I am and I suggest that we have the patient continue to monitor for any signs of infection or worsening. In general I think that he does seem to be making really good progress I think switching to the endoform's can be a good option. 2. I am then recommend as well that the patient should continue to utilize the gauze to cover followed by roll gauze to secure in place. 3. I am also going to recommend the patient should continue to offload is much as he can I think the more this he can do the better. We will see patient back for reevaluation in 1 week here in the clinic. If anything worsens or changes patient will contact our office for additional recommendations. Electronic Signature(s) Signed: 08/24/2023 2:30:52 PM By: Allen Derry PA-C Entered By: Allen Derry on 08/24/2023 11:30:52 -------------------------------------------------------------------------------- SuperBill Details Patient Name: Date of Service: [name] 08/24/2023 [name] ([id number]) [id number] Page 7 of 7 Medical Record Number: [id number] Patient Account Number: [id number] Date of Birth/Sex: Treating RN: 1941-05-31 (82 y.o. Harlon Flor, Millard.Loa Primary Care Provider: [name] Other Clinician: Referring Provider: Treating Provider/Extender: Gaynelle Arabian in Treatment: 6 Diagnosis Coding ICD-10 Codes Code Description [phone] Type 2 diabetes mellitus with  foot ulcer L97.518 Non-pressure chronic ulcer of other part of right foot with other specified severity E11.51 Type 2 diabetes mellitus with diabetic peripheral angiopathy without gangrene E11.42 Type 2 diabetes mellitus with diabetic polyneuropathy Facility Procedures : CPT4 Code: 60630160 Description: 99213 - WOUND CARE VISIT-LEV 3 EST PT Modifier: Quantity: 1 Physician Procedures : CPT4 Code Description Modifier [id number] 99213 - WC PHYS LEVEL 3 - EST PT ICD-10 Diagnosis Description E11.621 Type 2 diabetes mellitus with foot ulcer L97.518 Non-pressure chronic ulcer of other part of right foot with other specified severity E11.51  Type 2 diabetes mellitus with diabetic peripheral angiopathy without gangrene E11.42 Type 2 diabetes mellitus with diabetic polyneuropathy Quantity: 1 Electronic Signature(s) Signed: 08/24/2023 2:31:08 PM By: Allen Derry PA-C Entered By: Allen Derry on 08/24/2023 11:31:08  point he is in agreement with that plan. Fortunately I do not see any evidence of infection or worsening overall and I am pleased in that regard. Electronic Signature(s) Signed: 08/24/2023 2:29:30 PM By: Allen Derry PA-C Entered By: Allen Derry on 08/24/2023 11:29:30 -------------------------------------------------------------------------------- Physical Exam Details Patient Name: Date of Service: Jason [name], Jason [name] 08/24/2023 1:00 PM Medical Record Number: [id number] Patient Account Number: [id number] Date of Birth/Sex: Treating RN: [birth year] (82 y.o. M) Primary Care Provider:  [name] Other Clinician: Referring Provider: Treating Provider/Extender: Gaynelle Arabian in Treatment: 6 Constitutional Well-nourished and well-hydrated in no acute distress. Respiratory normal breathing without difficulty. Psychiatric this patient is able to make decisions and demonstrates good insight into disease process. Alert and Oriented x 3. pleasant and cooperative. Notes Upon inspection patient's wound bed showed signs of good granulation epithelization at this point. Fortunately I see no signs of active infection which is great news and in general I do believe that we will make an excellent headway towards closure. I am extremely pleased with where we stand currently. Electronic Signature(s) Signed: 08/24/2023 2:29:49 PM By: Allen Derry PA-C Entered By: Allen Derry on 08/24/2023 11:29:48 [name] ([id number]) [id number] Page 3 of 7 -------------------------------------------------------------------------------- Physician Orders Details Patient Name: Date of Service: Jason [name], Jason [name] 08/24/2023 1:00 PM Medical Record Number: [id number] Patient Account Number: [id number] Date of Birth/Sex: Treating RN: 13-Sep-1941 (82 y.o. Tammy Sours Primary Care Provider: [name] Other Clinician: Referring Provider: Treating Provider/Extender: Gaynelle Arabian in Treatment: 6 The following information was scribed by: Shawn Stall The information was scribed for: Lenda Kelp Verbal / Phone Orders: No Diagnosis Coding ICD-10 Coding Code Description E11.621 Type 2 diabetes mellitus with foot ulcer L97.518 Non-pressure chronic ulcer of other part of right foot with other specified severity E11.51 Type 2 diabetes mellitus with diabetic peripheral angiopathy without gangrene E11.42 Type 2 diabetes mellitus with diabetic polyneuropathy Follow-up Appointments ppointment in 1 week. - Rollie Hynek [facility] PA [facility]  (Room 9) 08/31/23 at 11:15am Return A ppointment in 2 weeks. - Marcella Charlson STONE PA [facility] (Room 9) 09/07/23 at 11:15am Return A Return appointment in 3 weeks. - Shayden Gingrich STONE PA [facility] (Room 9) 09/14/23 at 11:15am Return appointment in 1 month. - Please make appointment with front desk Anesthetic (In clinic) Topical Lidocaine 4% applied to wound bed - Used in Clinic Bathing/ Shower/ Hygiene May shower and wash wound with soap and water. - After shower, may change wound dressing if so desired Home Health Wound #5 Right Amputation Site - Transmetatarsal New wound care orders this week; continue Home Health for wound care. May utilize formulary equivalent dressing for wound treatment orders unless otherwise specified. - use endoform as primary dressing place into the wound bed.Then place gauze,Kerlix tape over wound dressing. Other Home Health Orders/Instructions: - Wound Care by [facility] at least twice a week. Wound Treatment Wound #5 - Amputation Site - Transmetatarsal Wound Laterality: Right Cleanser: Soap and Water 3 x Per Week/30 Days Discharge Instructions: May shower and wash wound with dial antibacterial soap and water prior to dressing change. Cleanser: Vashe 5.8 (oz) 3 x Per Week/30 Days Discharge Instructions: Cleanse the wound with Vashe prior to applying a clean dressing using gauze sponges, not tissue or cotton balls. Cleanser: Wound Cleanser 3 x Per Week/30 Days Discharge Instructions: Cleanse the wound with wound cleanser prior to applying a clean dressing using gauze sponges, not tissue or cotton balls. Prim Dressing:  continue Home Health for wound care. May utilize formulary equivalent dressing for wound treatment orders unless otherwise specified. - use endoform as primary dressing place into the wound bed.Then place gauze,Kerlix tape over wound dressing. Other Home Health Orders/Instructions: - Wound Care by [facility] at least twice a week. WOUND #5: - Amputation Site - Transmetatarsal Wound Laterality: Right Cleanser: Soap and Water 3 x Per Week/30 Days Discharge Instructions: May shower and wash wound with dial antibacterial soap and water prior to dressing change. Cleanser: Vashe 5.8 (oz) 3 x Per Week/30 Days Discharge Instructions: Cleanse the wound with Vashe prior to applying a clean dressing using gauze sponges, not tissue or cotton balls. Cleanser: Wound Cleanser 3 x Per Week/30 Days Discharge  Instructions: Cleanse the wound with wound cleanser prior to applying a clean dressing using gauze sponges, not tissue or cotton balls. Prim Dressing: Endoform 2x2 in 3 x Per Week/30 Days ary Discharge Instructions: Moisten with saline Secondary Dressing: Woven Gauze Sponge, Non-Sterile 4x4 in 3 x Per Week/30 Days Discharge Instructions: Apply over primary dressing as directed. Secured With: [employer], 4.5x3.1 (in/yd) 3 x Per Week/30 Days Discharge Instructions: Secure with Kerlix as directed. 1. I am and I suggest that we have the patient continue to monitor for any signs of infection or worsening. In general I think that he does seem to be making really good progress I think switching to the endoform's can be a good option. 2. I am then recommend as well that the patient should continue to utilize the gauze to cover followed by roll gauze to secure in place. 3. I am also going to recommend the patient should continue to offload is much as he can I think the more this he can do the better. We will see patient back for reevaluation in 1 week here in the clinic. If anything worsens or changes patient will contact our office for additional recommendations. Electronic Signature(s) Signed: 08/24/2023 2:30:52 PM By: Allen Derry PA-C Entered By: Allen Derry on 08/24/2023 11:30:52 -------------------------------------------------------------------------------- SuperBill Details Patient Name: Date of Service: [name] 08/24/2023 [name] ([id number]) [id number] Page 7 of 7 Medical Record Number: [id number] Patient Account Number: [id number] Date of Birth/Sex: Treating RN: 1941-05-31 (82 y.o. Harlon Flor, [place]oa Primary Care Provider: [name] Other Clinician: Referring Provider: Treating Provider/Extender: Gaynelle Arabian in Treatment: 6 Diagnosis Coding ICD-10 Codes Code Description [phone] Type 2 diabetes mellitus with  foot ulcer L97.518 Non-pressure chronic ulcer of other part of right foot with other specified severity E11.51 Type 2 diabetes mellitus with diabetic peripheral angiopathy without gangrene E11.42 Type 2 diabetes mellitus with diabetic polyneuropathy Facility Procedures : CPT4 Code: 60630160 Description: 99213 - WOUND CARE VISIT-LEV 3 EST PT Modifier: Quantity: 1 Physician Procedures : CPT4 Code Description Modifier [id number] 99213 - WC PHYS LEVEL 3 - EST PT ICD-10 Diagnosis Description E11.621 Type 2 diabetes mellitus with foot ulcer L97.518 Non-pressure chronic ulcer of other part of right foot with other specified severity E11.51  Type 2 diabetes mellitus with diabetic peripheral angiopathy without gangrene E11.42 Type 2 diabetes mellitus with diabetic polyneuropathy Quantity: 1 Electronic Signature(s) Signed: 08/24/2023 2:31:08 PM By: Allen Derry PA-C Entered By: Allen Derry on 08/24/2023 11:31:08  continue Home Health for wound care. May utilize formulary equivalent dressing for wound treatment orders unless otherwise specified. - use endoform as primary dressing place into the wound bed.Then place gauze,Kerlix tape over wound dressing. Other Home Health Orders/Instructions: - Wound Care by [facility] at least twice a week. WOUND #5: - Amputation Site - Transmetatarsal Wound Laterality: Right Cleanser: Soap and Water 3 x Per Week/30 Days Discharge Instructions: May shower and wash wound with dial antibacterial soap and water prior to dressing change. Cleanser: Vashe 5.8 (oz) 3 x Per Week/30 Days Discharge Instructions: Cleanse the wound with Vashe prior to applying a clean dressing using gauze sponges, not tissue or cotton balls. Cleanser: Wound Cleanser 3 x Per Week/30 Days Discharge  Instructions: Cleanse the wound with wound cleanser prior to applying a clean dressing using gauze sponges, not tissue or cotton balls. Prim Dressing: Endoform 2x2 in 3 x Per Week/30 Days ary Discharge Instructions: Moisten with saline Secondary Dressing: Woven Gauze Sponge, Non-Sterile 4x4 in 3 x Per Week/30 Days Discharge Instructions: Apply over primary dressing as directed. Secured With: [employer], 4.5x3.1 (in/yd) 3 x Per Week/30 Days Discharge Instructions: Secure with Kerlix as directed. 1. I am and I suggest that we have the patient continue to monitor for any signs of infection or worsening. In general I think that he does seem to be making really good progress I think switching to the endoform's can be a good option. 2. I am then recommend as well that the patient should continue to utilize the gauze to cover followed by roll gauze to secure in place. 3. I am also going to recommend the patient should continue to offload is much as he can I think the more this he can do the better. We will see patient back for reevaluation in 1 week here in the clinic. If anything worsens or changes patient will contact our office for additional recommendations. Electronic Signature(s) Signed: 08/24/2023 2:30:52 PM By: Allen Derry PA-C Entered By: Allen Derry on 08/24/2023 11:30:52 -------------------------------------------------------------------------------- SuperBill Details Patient Name: Date of Service: [name] 08/24/2023 [name] ([id number]) [id number] Page 7 of 7 Medical Record Number: [id number] Patient Account Number: [id number] Date of Birth/Sex: Treating RN: 1941-05-31 (82 y.o. Harlon Flor, Millard.[place]oa Primary Care Provider: [name] Other Clinician: Referring Provider: Treating Provider/Extender: Gaynelle Arabian in Treatment: 6 Diagnosis Coding ICD-10 Codes Code Description [phone] Type 2 diabetes mellitus with  foot ulcer L97.518 Non-pressure chronic ulcer of other part of right foot with other specified severity E11.51 Type 2 diabetes mellitus with diabetic peripheral angiopathy without gangrene E11.42 Type 2 diabetes mellitus with diabetic polyneuropathy Facility Procedures : CPT4 Code: 60630160 Description: 99213 - WOUND CARE VISIT-LEV 3 EST PT Modifier: Quantity: 1 Physician Procedures : CPT4 Code Description Modifier [id number] 99213 - WC PHYS LEVEL 3 - EST PT ICD-10 Diagnosis Description E11.621 Type 2 diabetes mellitus with foot ulcer L97.518 Non-pressure chronic ulcer of other part of right foot with other specified severity E11.51  Type 2 diabetes mellitus with diabetic peripheral angiopathy without gangrene E11.42 Type 2 diabetes mellitus with diabetic polyneuropathy Quantity: 1 Electronic Signature(s) Signed: 08/24/2023 2:31:08 PM By: Allen Derry PA-C Entered By: Allen Derry on 08/24/2023 11:31:08  point he is in agreement with that plan. Fortunately I do not see any evidence of infection or worsening overall and I am pleased in that regard. Electronic Signature(s) Signed: 08/24/2023 2:29:30 PM By: Allen Derry PA-C Entered By: Allen Derry on 08/24/2023 11:29:30 -------------------------------------------------------------------------------- Physical Exam Details Patient Name: Date of Service: Jason [name], Jason [name] 08/24/2023 1:00 PM Medical Record Number: [id number] Patient Account Number: [id number] Date of Birth/Sex: Treating RN: [birth year] (82 y.o. M) Primary Care Provider:  [name] Other Clinician: Referring Provider: Treating Provider/Extender: Gaynelle Arabian in Treatment: 6 Constitutional Well-nourished and well-hydrated in no acute distress. Respiratory normal breathing without difficulty. Psychiatric this patient is able to make decisions and demonstrates good insight into disease process. Alert and Oriented x 3. pleasant and cooperative. Notes Upon inspection patient's wound bed showed signs of good granulation epithelization at this point. Fortunately I see no signs of active infection which is great news and in general I do believe that we will make an excellent headway towards closure. I am extremely pleased with where we stand currently. Electronic Signature(s) Signed: 08/24/2023 2:29:49 PM By: Allen Derry PA-C Entered By: Allen Derry on 08/24/2023 11:29:48 [name] ([id number]) [id number] Page 3 of 7 -------------------------------------------------------------------------------- Physician Orders Details Patient Name: Date of Service: Jason [name], Jason [name] 08/24/2023 1:00 PM Medical Record Number: [id number] Patient Account Number: [id number] Date of Birth/Sex: Treating RN: 13-Sep-1941 (82 y.o. Tammy Sours Primary Care Provider: [name] Other Clinician: Referring Provider: Treating Provider/Extender: Gaynelle Arabian in Treatment: 6 The following information was scribed by: Shawn Stall The information was scribed for: Lenda Kelp Verbal / Phone Orders: No Diagnosis Coding ICD-10 Coding Code Description E11.621 Type 2 diabetes mellitus with foot ulcer L97.518 Non-pressure chronic ulcer of other part of right foot with other specified severity E11.51 Type 2 diabetes mellitus with diabetic peripheral angiopathy without gangrene E11.42 Type 2 diabetes mellitus with diabetic polyneuropathy Follow-up Appointments ppointment in 1 week. - Rollie Hynek STONE PA WED[facility]DAY  (Room 9) 08/31/23 at 11:15am Return A ppointment in 2 weeks. - Marcella Charlson STONE PA [facility] (Room 9) 09/07/23 at 11:15am Return A Return appointment in 3 weeks. - Shayden Gingrich STONE PA [facility] (Room 9) 09/14/23 at 11:15am Return appointment in 1 month. - Please make appointment with front desk Anesthetic (In clinic) Topical Lidocaine 4% applied to wound bed - Used in Clinic Bathing/ Shower/ Hygiene May shower and wash wound with soap and water. - After shower, may change wound dressing if so desired Home Health Wound #5 Right Amputation Site - Transmetatarsal New wound care orders this week; continue Home Health for wound care. May utilize formulary equivalent dressing for wound treatment orders unless otherwise specified. - use endoform as primary dressing place into the wound bed.Then place gauze,Kerlix tape over wound dressing. Other Home Health Orders/Instructions: - Wound Care by [facility] at least twice a week. Wound Treatment Wound #5 - Amputation Site - Transmetatarsal Wound Laterality: Right Cleanser: Soap and Water 3 x Per Week/30 Days Discharge Instructions: May shower and wash wound with dial antibacterial soap and water prior to dressing change. Cleanser: Vashe 5.8 (oz) 3 x Per Week/30 Days Discharge Instructions: Cleanse the wound with Vashe prior to applying a clean dressing using gauze sponges, not tissue or cotton balls. Cleanser: Wound Cleanser 3 x Per Week/30 Days Discharge Instructions: Cleanse the wound with wound cleanser prior to applying a clean dressing using gauze sponges, not tissue or cotton balls. Prim Dressing:  point he is in agreement with that plan. Fortunately I do not see any evidence of infection or worsening overall and I am pleased in that regard. Electronic Signature(s) Signed: 08/24/2023 2:29:30 PM By: Allen Derry PA-C Entered By: Allen Derry on 08/24/2023 11:29:30 -------------------------------------------------------------------------------- Physical Exam Details Patient Name: Date of Service: Jason [name], Jason [name] 08/24/2023 1:00 PM Medical Record Number: [id number] Patient Account Number: [id number] Date of Birth/Sex: Treating RN: [birth year] (82 y.o. M) Primary Care Provider:  [name] Other Clinician: Referring Provider: Treating Provider/Extender: Gaynelle Arabian in Treatment: 6 Constitutional Well-nourished and well-hydrated in no acute distress. Respiratory normal breathing without difficulty. Psychiatric this patient is able to make decisions and demonstrates good insight into disease process. Alert and Oriented x 3. pleasant and cooperative. Notes Upon inspection patient's wound bed showed signs of good granulation epithelization at this point. Fortunately I see no signs of active infection which is great news and in general I do believe that we will make an excellent headway towards closure. I am extremely pleased with where we stand currently. Electronic Signature(s) Signed: 08/24/2023 2:29:49 PM By: Allen Derry PA-C Entered By: Allen Derry on 08/24/2023 11:29:48 [name] ([id number]) [id number] Page 3 of 7 -------------------------------------------------------------------------------- Physician Orders Details Patient Name: Date of Service: Jason [name], Jason [name] 08/24/2023 1:00 PM Medical Record Number: [id number] Patient Account Number: [id number] Date of Birth/Sex: Treating RN: 13-Sep-1941 (82 y.o. Tammy Sours Primary Care Provider: [name] Other Clinician: Referring Provider: Treating Provider/Extender: Gaynelle Arabian in Treatment: 6 The following information was scribed by: Shawn Stall The information was scribed for: Lenda Kelp Verbal / Phone Orders: No Diagnosis Coding ICD-10 Coding Code Description E11.621 Type 2 diabetes mellitus with foot ulcer L97.518 Non-pressure chronic ulcer of other part of right foot with other specified severity E11.51 Type 2 diabetes mellitus with diabetic peripheral angiopathy without gangrene E11.42 Type 2 diabetes mellitus with diabetic polyneuropathy Follow-up Appointments ppointment in 1 week. - Rollie Hynek STONE PA WEDN[facility]DAY  (Room 9) 08/31/23 at 11:15am Return A ppointment in 2 weeks. - Marcella Charlson STONE PA [facility] (Room 9) 09/07/23 at 11:15am Return A Return appointment in 3 weeks. - Shayden Gingrich STONE PA [facility] (Room 9) 09/14/23 at 11:15am Return appointment in 1 month. - Please make appointment with front desk Anesthetic (In clinic) Topical Lidocaine 4% applied to wound bed - Used in Clinic Bathing/ Shower/ Hygiene May shower and wash wound with soap and water. - After shower, may change wound dressing if so desired Home Health Wound #5 Right Amputation Site - Transmetatarsal New wound care orders this week; continue Home Health for wound care. May utilize formulary equivalent dressing for wound treatment orders unless otherwise specified. - use endoform as primary dressing place into the wound bed.Then place gauze,Kerlix tape over wound dressing. Other Home Health Orders/Instructions: - Wound Care by [facility] at least twice a week. Wound Treatment Wound #5 - Amputation Site - Transmetatarsal Wound Laterality: Right Cleanser: Soap and Water 3 x Per Week/30 Days Discharge Instructions: May shower and wash wound with dial antibacterial soap and water prior to dressing change. Cleanser: Vashe 5.8 (oz) 3 x Per Week/30 Days Discharge Instructions: Cleanse the wound with Vashe prior to applying a clean dressing using gauze sponges, not tissue or cotton balls. Cleanser: Wound Cleanser 3 x Per Week/30 Days Discharge Instructions: Cleanse the wound with wound cleanser prior to applying a clean dressing using gauze sponges, not tissue or cotton balls. Prim Dressing:

## 2023-08-24 NOTE — Progress Notes (Unsigned)
Cardiology Office Note:   Date:  08/24/2023  ID:  Jason [name], DOB November 19, 1940, MRN [id number] PCP: [name], MD  [facility] Providers Cardiologist:  [name], MD {  History of Present Illness:   [name] is a 82 y.o. male has a PMH of mitral valve prolapse and moderate MR as well as mitral valve stenosis.  His PMH also includes atrial flutter, HLD, and type 2 diabetes.  He previously wore a cardiac event monitor which showed atrial flutter 100% of the time that was rate controlled.  He underwent TEE 08/12/2021 which showed a rheumatic appearing mitral valve with severely calcified anterior and posterior leaflets.  He was felt to have severe mitral stenosis and severe mitral regurgitation.  He was in the hospital in June 2024 with respiratory failure.  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  He has severe MS but is not thought to be a surgical candidate with his severe comorbid disease and non adherence.    ***   1. Left ventricular ejection fraction, by estimation, is 60 to 65%. The  left ventricle has normal function. The left ventricle has no regional  wall motion abnormalities. There is severe asymmetric left ventricular  hypertrophy of the septal segment. Left   ventricular diastolic parameters are indeterminate. There is the  interventricular septum is flattened in diastole ('D' shaped left  ventricle), consistent with right ventricular volume overload.   2. Right ventricular systolic function is normal. The right ventricular  size is not well visualized. There is mildly elevated pulmonary artery  systolic pressure. The estimated right ventricular systolic pressure is  43.3 mmHg.   3. Left atrial size was moderately dilated.   4. MVA by continuity equation < 1.5 cm2; unable to direct planimeter  valve. There is minimal forward movement, extensive calcification and  thickneing with relative sparing of the subvalvular apparatus. Wilkins  score 14; not  amenable to balloon  intervention . The mitral valve is rheumatic. Mild mitral valve  regurgitation. Severe mitral stenosis. The mean mitral valve gradient is  22.0 mmHg with average heart rate of 77 bpm.   5. The aortic valve was not well visualized. Aortic valve regurgitation  is not visualized. No aortic stenosis is present.   6. The inferior vena cava is dilated in size with <50% respiratory  variability, suggesting right atrial pressure of 15 mmHg.     He was seen by [name] on 08/03/2021.  During that time he was shown a video animation of his valvular issues.  He was limited in his overall activity due to fatigue.  He had also had some toes amputated on his right foot.  He was somewhat active with chores around his house.  He denied any symptoms of chest discomfort neck and arm discomfort.  He denied new shortness of breath orthopnea and PND.  He denied palpitations presyncope and syncope.   After his TEE 08/12/2021 attempt for follow-up was made.  He was unable to be reached via phone and missed scheduled follow-up appointments.  Eventually he was sent a letter.   He was seen in follow-up by [name] on 10/18/2022.  During that time he was medically noncompliant.  Options for treatment were discussed.  He reported that he was not walking.  He had a fall and was working with physical therapy to help with his balance.  He did have the ability to walk.  He denied acute dyspnea with walking across his  house.  He was not noted to have PND orthopnea.  He was noted to have sleep apnea but denied CPAP use.  He denied presyncope or syncope and palpitations.  He denied chest pain.  He was not noted to have weight gain or lower extremity edema.  His blood pressure was 120/54.  His weight was noted to be 271 pounds.  His EKG showed sinus rhythm 72 bpm with first-degree AV block and no acute ST changes.  There was a long conversation had about his mitral stenosis.  He reported that he would want to  have the surgery if he was a candidate.  Good blood pressure control was discussed.  It was felt that he needed to be compliant with his medications and follow-up visits.  A plan was made for consultation with oral surgeon to address removing teeth.  It was felt that if he adhere to recommendations he might be a candidate for mitral valve surgery.   He presents to the clinic today for follow-up evaluation and states he feels fairly well.  He continues to be fairly sedentary at home.  He reports that the most exercise he does are occasional activities at home.  He is waiting on [name] to review his preoperative cardiac evaluation for his upcoming dental procedure.  He reports that he has a scheduled for 01/03/2023.  He reports compliance with his medications.  Today his blood pressure is 134/72.  His wife reports that they are considering joining [employer] or going to the [facility] to swim.  We reviewed the importance of being as physically fit as possible prior to open heart surgery to aid in recovery.  They expressed understanding.  I will continue his current medication regimen, give the salty 6 diet sheet, encouraged joining one of the 2 physical activity programs and we will plan follow-up in June.   Today he denies chest pain, shortness of breath, lower extremity edema, fatigue, palpitations, melena, hematuria, hemoptysis, diaphoresis, weakness, presyncope, syncope, orthopnea, and PND.  ROS: ***  Studies Reviewed:    EKG:       ***  Risk Assessment/Calculations:   {Does this patient have ATRIAL FIBRILLATION?:[phone]} No BP recorded.  {Refresh Note OR Click here to enter BP  :1}***        Physical Exam:   VS:  There were no vitals taken for this visit.   Wt Readings from Last 3 Encounters:  05/24/23 274 lb (124.3 kg)  05/18/23 272 lb (123.4 kg)  05/10/23 272 lb 11.3 oz (123.7 kg)     GEN: Well nourished, well developed in no acute distress NECK: No JVD; No carotid  bruits CARDIAC: ***RRR, no murmurs, rubs, gallops RESPIRATORY:  Clear to auscultation without rales, wheezing or rhonchi  ABDOMEN: Soft, non-tender, non-distended EXTREMITIES:  No edema; No deformity   ASSESSMENT AND PLAN:   Mitral valve stenosis:  ***  -previously noncompliant with therapy and follow-up appointments.  During previous appointment with Dr. 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  Details above.  Continues to be fairly sedentary at home.  He reports that he is considering joining the [employer] and possibly [employer] sne[employer]. Heart healthy low-sodium diet-salty 6 given Increase physical activity as tolerated   Atrial flutter:  ***  -heart rate today 75 BPM.  Denies recent episodes of accelerated or irregular heartbeat. Continue Xarelto Heart healthy low-sodium diet  Increase physical activity as tolerated Avoid triggers caffeine, chocolate, EtOH, dehydration etc.   Hyperlipidemia:  ***  -LDL 138. Continue bempedoic acid, simvastatin Heart healthy low-sodium diet-salty 6 given Increase physical activity as tolerated   Type 2 diabetes:  ***  -327 on 03/16/2022 Continue Ozempic, metformin, insulin Heart healthy low-sodium carb modified diet Increase physical activity as tolerated Follows with PCP    {Are you ordering a CV Procedure (e.g. stress test, cath, DCCV, TEE, etc)?   Press F2        :[id number]}  Follow up ***  Signed, [name], MD

## 2023-08-25 ENCOUNTER — Encounter: Payer: Self-pay | Admitting: Cardiology

## 2023-08-25 ENCOUNTER — Ambulatory Visit: Payer: Medicare HMO | Attending: Cardiology | Admitting: Cardiology

## 2023-08-25 VITALS — BP 106/60 | HR 80 | Ht >= 80 in | Wt 266.4 lb

## 2023-08-25 DIAGNOSIS — E785 Hyperlipidemia, unspecified: Secondary | ICD-10-CM | POA: Diagnosis not present

## 2023-08-25 DIAGNOSIS — I4892 Unspecified atrial flutter: Secondary | ICD-10-CM | POA: Diagnosis not present

## 2023-08-25 DIAGNOSIS — E118 Type 2 diabetes mellitus with unspecified complications: Secondary | ICD-10-CM | POA: Diagnosis not present

## 2023-08-25 DIAGNOSIS — I059 Rheumatic mitral valve disease, unspecified: Secondary | ICD-10-CM | POA: Diagnosis not present

## 2023-08-25 NOTE — Patient Instructions (Signed)
    Follow-Up: At [facility], you and your health needs are our priority.  As part of our continuing mission to provide you with exceptional heart care, we have created designated Provider Care Teams.  These Care Teams include your primary Cardiologist (physician) and Advanced Practice Providers (APPs -  Physician Assistants and Nurse Practitioners) who all work together to provide you with the care you need, when you need it.  We recommend signing up for the patient portal called "MyChart".  Sign up information is provided on this After Visit Summary.  MyChart is used to connect with patients for Virtual Visits (Telemedicine).  Patients are able to view lab/test results, encounter notes, upcoming appointments, etc.  Non-urgent messages can be sent to your provider as well.   To learn more about what you can do with MyChart, go to [email or web address].    Your next appointment:   6 month(s)  Provider:   Marjie Skiff PA

## 2023-08-25 NOTE — Progress Notes (Signed)
With [employer] Sterile, 4.5x3.1 (in/yd) Discharge Instruction: Secure with Kerlix as directed. Compression Wrap Compression Stockings Add-Ons Electronic Signature(s) Signed: 08/24/2023 5:41:53 PM By: Thayer Dallas Entered By: Thayer Dallas on 08/24/2023 10:20:51 -------------------------------------------------------------------------------- Vitals Details Patient Name: Date of Service: Jason Milian S. 08/24/2023 1:00 PM Medical Record Number: [id number] Patient Account Number: [id number] Date of Birth/Sex: Treating  RN: Sep 10, 1941 (82 y.o. M) Primary Care Jason [name]: Jason [name] Other Clinician: Referring Jason [name]: Treating Jason [name]/Extender: Jason [name] in Treatment: 6 Vital Signs Time Taken: 13:17 Pulse (bpm): 85 Height (in): 80 Respiratory Rate (breaths/min): 16 Weight (lbs): 280 Blood Pressure (mmHg): 110/73 Body Mass Index (BMI): 30.8 Reference Range: 80 - 120 mg / dl Electronic Signature(s) Signed: 08/24/2023 5:41:53 PM By: Thayer Dallas Entered By: Thayer Dallas on 08/24/2023 10:17:45  With [employer] Sterile, 4.5x3.1 (in/yd) Discharge Instruction: Secure with Kerlix as directed. Compression Wrap Compression Stockings Add-Ons Electronic Signature(s) Signed: 08/24/2023 5:41:53 PM By: Thayer Dallas Entered By: Thayer Dallas on 08/24/2023 10:20:51 -------------------------------------------------------------------------------- Vitals Details Patient Name: Date of Service: Jason Milian S. 08/24/2023 1:00 PM Medical Record Number: [id number] Patient Account Number: [id number] Date of Birth/Sex: Treating  RN: Sep 10, 1941 (82 y.o. M) Primary Care Jason [name]: Jason [name] Other Clinician: Referring Jason [name]: Treating Jason [name]/Extender: Jason [name] in Treatment: 6 Vital Signs Time Taken: 13:17 Pulse (bpm): 85 Height (in): 80 Respiratory Rate (breaths/min): 16 Weight (lbs): 280 Blood Pressure (mmHg): 110/73 Body Mass Index (BMI): 30.8 Reference Range: 80 - 120 mg / dl Electronic Signature(s) Signed: 08/24/2023 5:41:53 PM By: Thayer Dallas Entered By: Thayer Dallas on 08/24/2023 10:17:45  [name], [name] ([id number]) [id number] Page 1 of 7 Visit Report for 08/24/2023 Arrival Information Details Patient Name: Date of Service: Jason [name], Jason [name] 08/24/2023 1:00 PM Medical Record Number: [id number] Patient Account Number: [id number] Date of Birth/Sex: Treating RN: 10-01-1941 (82 y.o. M) Primary Care Kylin Dubs: Jason [name] Other Clinician: Referring Rossie Scarfone: Treating Kashten Gowin/Extender: Jason [name] in Treatment: 6 Visit Information History Since Last Visit Added or deleted any medications: No Patient Arrived: Ambulatory Any new allergies or adverse reactions: No Arrival Time: 13:06 Had a fall or experienced change in No Accompanied By: wife activities of daily living that may affect Transfer Assistance: None risk of falls: Patient Identification Verified: Yes Signs or symptoms of abuse/neglect since last visito No Secondary Verification Process Completed: Yes Hospitalized since last visit: No Patient Has Alerts: Yes Implantable device outside of the clinic excluding No Patient Alerts: ABI R 1.46 (03/29/23) cellular tissue based products placed in the center ABI L 1.63 (03/29/23) since last visit: Has Dressing in Place as Prescribed: Yes Pain Present Now: No Electronic Signature(s) Signed: 08/24/2023 5:41:53 PM By: Thayer Dallas Entered By: Thayer Dallas on 08/24/2023 10:08:56 -------------------------------------------------------------------------------- Clinic Level of Care Assessment Details Patient Name: Date of Service: [name], [name] 08/24/2023 1:00 PM Medical Record Number: [id number] Patient Account Number: [id number] Date of Birth/Sex: Treating RN: Oct 05, 1941 (82 y.o. Tammy Sours Primary Care Dacoda Finlay: Jason [name] Other Clinician: Referring Ciaran Begay: Treating Ichael Pullara/Extender: Jason [name] in Treatment: 6 Clinic Level of Care Assessment Items TOOL 4 Quantity Score X- 1 0 Use when  only an EandM is performed on FOLLOW-UP visit ASSESSMENTS - Nursing Assessment / Reassessment X- 1 10 Reassessment of Co-morbidities (includes updates in patient status) X- 1 5 Reassessment of Adherence to Treatment Plan ASSESSMENTS - Wound and Skin A ssessment / Reassessment X - Simple Wound Assessment / Reassessment - one wound 1 5 []  - 0 Complex Wound Assessment / Reassessment - multiple wounds []  - 0 Dermatologic / Skin Assessment (not related to wound area) ASSESSMENTS - Focused Assessment X- 1 5 Circumferential Edema Measurements - multi extremities []  - 0 Nutritional Assessment / Counseling / Intervention [name], [name] ([id number]) [id number] Page 2 of 7 []  - 0 Lower Extremity Assessment (monofilament, tuning fork, pulses) []  - 0 Peripheral Arterial Disease Assessment (using hand held doppler) ASSESSMENTS - Ostomy and/or Continence Assessment and Care []  - 0 Incontinence Assessment and Management []  - 0 Ostomy Care Assessment and Management (repouching, etc.) PROCESS - Coordination of Care X - Simple Patient / Family Education for ongoing care 1 15 []  - 0 Complex (extensive) Patient / Family Education for ongoing care X- 1 10 Staff obtains Chiropractor, Records, T Results / Process Orders est X- 1 10 Staff telephones HHA, Nursing Homes / Clarify orders / etc []  - 0 Routine Transfer to another Facility (non-emergent condition) []  - 0 Routine Hospital Admission (non-emergent condition) []  - 0 New Admissions / Manufacturing engineer / Ordering NPWT Apligraf, etc. , []  - 0 Emergency Hospital Admission (emergent condition) X- 1 10 Simple Discharge Coordination []  - 0 Complex (extensive) Discharge Coordination PROCESS - Special Needs []  - 0 Pediatric / Minor Patient Management []  - 0 Isolation Patient Management []  - 0 Hearing / Language / Visual special needs []  - 0 Assessment of Community assistance (transportation, D/C planning,  etc.) []  - 0 Additional assistance / Altered mentation []  - 0 Support Surface(s) Assessment (bed, cushion, seat, etc.) INTERVENTIONS - Wound Cleansing / Measurement  [name], [name] ([id number]) [id number] Page 1 of 7 Visit Report for 08/24/2023 Arrival Information Details Patient Name: Date of Service: Jason [name], Jason [name] 08/24/2023 1:00 PM Medical Record Number: [id number] Patient Account Number: [id number] Date of Birth/Sex: Treating RN: 10-01-1941 (82 y.o. M) Primary Care Kylin Dubs: Jason [name] Other Clinician: Referring Rossie Scarfone: Treating Kashten Gowin/Extender: Jason [name] in Treatment: 6 Visit Information History Since Last Visit Added or deleted any medications: No Patient Arrived: Ambulatory Any new allergies or adverse reactions: No Arrival Time: 13:06 Had a fall or experienced change in No Accompanied By: wife activities of daily living that may affect Transfer Assistance: None risk of falls: Patient Identification Verified: Yes Signs or symptoms of abuse/neglect since last visito No Secondary Verification Process Completed: Yes Hospitalized since last visit: No Patient Has Alerts: Yes Implantable device outside of the clinic excluding No Patient Alerts: ABI R 1.46 (03/29/23) cellular tissue based products placed in the center ABI L 1.63 (03/29/23) since last visit: Has Dressing in Place as Prescribed: Yes Pain Present Now: No Electronic Signature(s) Signed: 08/24/2023 5:41:53 PM By: Thayer Dallas Entered By: Thayer Dallas on 08/24/2023 10:08:56 -------------------------------------------------------------------------------- Clinic Level of Care Assessment Details Patient Name: Date of Service: [name], [name] 08/24/2023 1:00 PM Medical Record Number: [id number] Patient Account Number: [id number] Date of Birth/Sex: Treating RN: Oct 05, 1941 (82 y.o. Tammy Sours Primary Care Dacoda Finlay: Jason [name] Other Clinician: Referring Ciaran Begay: Treating Ichael Pullara/Extender: Jason [name] in Treatment: 6 Clinic Level of Care Assessment Items TOOL 4 Quantity Score X- 1 0 Use when  only an EandM is performed on FOLLOW-UP visit ASSESSMENTS - Nursing Assessment / Reassessment X- 1 10 Reassessment of Co-morbidities (includes updates in patient status) X- 1 5 Reassessment of Adherence to Treatment Plan ASSESSMENTS - Wound and Skin A ssessment / Reassessment X - Simple Wound Assessment / Reassessment - one wound 1 5 []  - 0 Complex Wound Assessment / Reassessment - multiple wounds []  - 0 Dermatologic / Skin Assessment (not related to wound area) ASSESSMENTS - Focused Assessment X- 1 5 Circumferential Edema Measurements - multi extremities []  - 0 Nutritional Assessment / Counseling / Intervention [name], [name] ([id number]) [id number] Page 2 of 7 []  - 0 Lower Extremity Assessment (monofilament, tuning fork, pulses) []  - 0 Peripheral Arterial Disease Assessment (using hand held doppler) ASSESSMENTS - Ostomy and/or Continence Assessment and Care []  - 0 Incontinence Assessment and Management []  - 0 Ostomy Care Assessment and Management (repouching, etc.) PROCESS - Coordination of Care X - Simple Patient / Family Education for ongoing care 1 15 []  - 0 Complex (extensive) Patient / Family Education for ongoing care X- 1 10 Staff obtains Chiropractor, Records, T Results / Process Orders est X- 1 10 Staff telephones HHA, Nursing Homes / Clarify orders / etc []  - 0 Routine Transfer to another Facility (non-emergent condition) []  - 0 Routine Hospital Admission (non-emergent condition) []  - 0 New Admissions / Manufacturing engineer / Ordering NPWT Apligraf, etc. , []  - 0 Emergency Hospital Admission (emergent condition) X- 1 10 Simple Discharge Coordination []  - 0 Complex (extensive) Discharge Coordination PROCESS - Special Needs []  - 0 Pediatric / Minor Patient Management []  - 0 Isolation Patient Management []  - 0 Hearing / Language / Visual special needs []  - 0 Assessment of Community assistance (transportation, D/C planning,  etc.) []  - 0 Additional assistance / Altered mentation []  - 0 Support Surface(s) Assessment (bed, cushion, seat, etc.) INTERVENTIONS - Wound Cleansing / Measurement

## 2023-08-31 ENCOUNTER — Encounter (HOSPITAL_BASED_OUTPATIENT_CLINIC_OR_DEPARTMENT_OTHER): Payer: Medicare HMO | Admitting: Physician Assistant

## 2023-08-31 DIAGNOSIS — E11621 Type 2 diabetes mellitus with foot ulcer: Secondary | ICD-10-CM | POA: Diagnosis not present

## 2023-08-31 NOTE — Progress Notes (Addendum)
EDWAR, COE ([id number]) [id number] Page 1 of 8 Visit Report for 08/31/2023 Chief Complaint Document Details Patient Name: Date of Service: Jason [name], Jason [name] 08/31/2023 11:15 A M Medical Record Number: [id number] Patient Account Number: [id number] Date of Birth/Sex: Treating RN: 10-06-1941 (82 y.o. M) Primary Care Provider: [name] Other Clinician: Referring Provider: Treating Provider/Extender: Gaynelle Arabian in Treatment: 7 Information Obtained from: Patient Chief Complaint 01/31/17; patient is here for review of wound on his left great toe 07/13/2023; patient comes back to clinic for review of wounds on his right transmetatarsal amputation site Electronic Signature(s) Signed: 08/31/2023 11:27:27 AM By: Allen Derry PA-C Entered By: Allen Derry on 08/31/2023 11:27:27 -------------------------------------------------------------------------------- Debridement Details Patient Name: Date of Service: Jason Milian S. 08/31/2023 11:15 A M Medical Record Number: [id number] Patient Account Number: [id number] Date of Birth/Sex: Treating RN: 30-Jul-1941 (82 y.o. M) Primary Care Provider: [name] Other Clinician: Referring Provider: Treating Provider/Extender: Gaynelle Arabian in Treatment: 7 Debridement Performed for Assessment: Wound #5 Right Amputation Site - Transmetatarsal Performed By: Physician Lenda Kelp, PA The following information was scribed by: Karie Schwalbe The information was scribed for: Lenda Kelp Debridement Type: Debridement Severity of Tissue Pre Debridement: Fat layer exposed Level of Consciousness (Pre-procedure): Awake and Alert Pre-procedure Verification/Time Out Yes - 11:55 Taken: Start Time: 11:55 Pain Control: Lidocaine 4% Topical Solution Percent of Wound Bed Debrided: 100% T Area Debrided (cm): otal 0.16 Tissue and other material debrided: Non-Viable, Callus, Slough,  Subcutaneous, Slough Level: Skin/Subcutaneous Tissue Debridement Description: Excisional Instrument: Curette Bleeding: Minimum Hemostasis Achieved: Pressure End Time: 11:58 Procedural Pain: 0 Post Procedural Pain: 0 Response to Treatment: Procedure was tolerated well Level of Consciousness (Post- Awake and Alert procedure): Post Debridement Measurements of Total Wound Length: (cm) 0.4 Width: (cm) 0.5 Jason [name], Jason [name] ([id number]) [id number] Page 2 of 8 Depth: (cm) 0.8 Volume: (cm) 0.126 Character of Wound/Ulcer Post Debridement: Improved Severity of Tissue Post Debridement: Fat layer exposed Post Procedure Diagnosis Same as Pre-procedure Electronic Signature(s) Signed: 08/31/2023 2:05:04 PM By: Allen Derry PA-C Previous Signature: 08/31/2023 1:34:12 PM Version By: Karie Schwalbe RN Entered By: Allen Derry on 08/31/2023 14:05:04 -------------------------------------------------------------------------------- HPI Details Patient Name: Date of Service: Jason Milian S. 08/31/2023 11:15 A M Medical Record Number: [id number] Patient Account Number: [id number] Date of Birth/Sex: Treating RN: 1940/12/03 (82 y.o. M) Primary Care Provider: [name] Other Clinician: Referring Provider: Treating Provider/Extender: Gaynelle Arabian in Treatment: 7 History of Present Illness Chronic/Inactive Conditions Condition 1: He was followed for a period of time by Dr. Durwin Nora of vein and vascular as far as I can see last saw him on 1//24. 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 Last arterial studies I see showed noncompressible ABIs bilaterally but multiphasic waveforms on the right but a TBI of 0.44 on the left HPI Description: 01/31/17; this is a 82 year old man who has a history of type 2 diabetes on oral agents. He has a  history of wound difficulties however this is on his right foot in fact he has had amputations of his medial 3 toes. I did not actually look at the right foot today. He tells me about a month ago he was cutting his mycotic nails informed a wound on the medial aspect of the left great toe. He saw Dr. Logan Bores  of podiatry. He has been using Neosporin for a while and now simply wrapping it with gauze. He states he does have peripheral neuropathy. His hemoglobin A1c in this clinic was 1. 02/21/1989 non-compressible on the left. He did have vascular studies done in 2015 showing ABIs of 1.4 bilaterally, TBI's of 1.0 on the right 1.1 on the left with triphasic waveforms. He has not had imaging studies that I'm aware of 02/07/17; this is a 82 year old man with type 2 diabetes on oral agents. [place] week with a small open area on the medial aspect of his left great toe. This is in the setting of a significant mycotic nail for which he follows with Dr. Logan Bores of podiatry READMISSION 07/13/2023 This is a now 82 year old man who is had problems with a wound on his right foot amputation site after undergoing a transmetatarsal amputation in May of this year. He was admitted to hospital from 03/29/2023 through 04/02/2023 with sepsis felt to be secondary to an infected wound I think on the right first metatarsal head he underwent a transmetatarsal amputation by podiatry in the hospital. A culture of the wound had previously shown Klebsiella. He was seen in follow-up in the office on 04/19/2023 with a postop wound infection and was given doxycycline and Augmentin for 14 days. He has been dressing the wounds with Betadine wet-to-dry. Looking through epic it would appear that the follow-up with the podiatry group has been sporadic. The patient tells me he was at [facility] skilled facility although he is back at home now. His wound now is actually probably between the fourth and fifth metatarsal head on  the amputation site. He has a smaller wound on the dorsal foot in this area and I am sure at 1 time these connected but I could not prove this today. He just came in with kerlix and dry gauze on the wound. He was followed for a period of time by Dr. Durwin Nora of vein and vascular as far as I can see last saw him on 1//24. 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 Last arterial studies I see showed noncompressible ABIs bilaterally but multiphasic waveforms on the right but a TBI of 0.44 on the left Past medical history includes type 2 diabetes with peripheral neuropathy and PAD, diastolic heart failure, mitral valve stenosis, atrial fibrillation on Xarelto, sleep apnea ABIs were not repeated today in the clinic as they are previously noncompressible as noted His MRI is listed below; This did not suggest osteomyelitis in the area of the right foot where his wound is currently located MRI 03/29/23 IMPRESSION: 1. Deep skin wound about the plantar aspect of the first metatarsal head with mild marrow edema about the plantar aspect of the first metatarsal head concerning for osteomyelitis. 2. No fluid collection or abscess. 3. Postsurgical changes with prior amputation of the right foot through the metatarsophalangeal joints of the first and fifth digit and through metatarsal heads of the second through fourth digits. 4. Edema of the plantar muscles and tendons suggesting diabetic myopathy/myositis. KEATS, KINGRY ([id number]) [id number] Page 3 of 8 07-20-2023 upon evaluation today patient presents for follow-up evaluation here in the clinic. I did review his note from the last visit with Dr. Leanord Hawking. With that being said he is a current patient of vein and vascular here in [place] and has seen  them last it looks like according to Dr. Jannetta Quint note in  January of this year. At that point he had multiphasic waveforms on the right with a TBI of 0.44 on the left. I do not see any recent x-rays currently although he has seen podiatry I cannot see any of those x-rays. I really think having him go for a x-ray of the foot would be a good idea. 07-27-2023 upon evaluation today patient appears to be doing well currently in regard to his wound. He is actually showing signs of good improvement. I actually feel like this is significantly improved compared to last week's evaluation he does seem to have good granulation and epithelization and very pleased there is some callus I am going to need to remove however. 08-10-2023 upon evaluation today patient appears to be doing well currently in regard to his wound. The foot actually appears to be showing signs of excellent improvement and I am very pleased in that regard. Fortunately I do not see any evidence of worsening overall and I do believe that the patient is making good headway towards closure. For now I am probably going to recommend that we continue with the [facility] although we may make a switch to collagen in the near future depending on how things progress. 08-24-2023 upon evaluation today patient appears to be doing well currently in regard to his wound. He has been tolerating the dressing changes I do believe it may be good to switch over to endoform at this point he is in agreement with that plan. Fortunately I do not see any evidence of infection or worsening overall and I am pleased in that regard. 08-31-2023 upon evaluation today patient's wound bed actually showed signs of good granulation epithelization at this point. Fortunately I do not see any signs of infection he seems to be doing quite well based on what I am seeing today. Electronic Signature(s) Signed: 08/31/2023 2:03:04 PM By: Allen Derry PA-C Entered By: Allen Derry on 08/31/2023  14:03:04 -------------------------------------------------------------------------------- Physical Exam Details Patient Name: Date of Service: Jason [name], Jason [name] 08/31/2023 11:15 A M Medical Record Number: [id number] Patient Account Number: [id number] Date of Birth/Sex: Treating RN: 05-14-1941 (82 y.o. M) Primary Care Provider: [name] Other Clinician: Referring Provider: Treating Provider/Extender: Gaynelle Arabian in Treatment: 7 Constitutional Well-nourished and well-hydrated in no acute distress. Respiratory normal breathing without difficulty. Psychiatric this patient is able to make decisions and demonstrates good insight into disease process. Alert and Oriented x 3. pleasant and cooperative. Notes Upon inspection patient's wound bed showed signs of good granulation epithelization at the perform debridement clearway callus and slough and biofilm down to good subcutaneous tissue he tolerated that today without complication postdebridement the wound bed is significantly improved which is great news I am very pleased with what we are seeing today. Electronic Signature(s) Signed: 08/31/2023 2:03:20 PM By: Allen Derry PA-C Entered By: Allen Derry on 08/31/2023 14:03:20 -------------------------------------------------------------------------------- Physician Orders Details Patient Name: Date of Service: Jason Milian S. 08/31/2023 11:15 A M Medical Record Number: [id number] Patient Account Number: [id number] Date of Birth/Sex: Treating RN: 1941-01-09 (82 y.o. [name] Primary Care Provider: [name] Other Clinician: Referring Provider: Treating Provider/Extender: Gaynelle Arabian in Treatment: 7 Verbal / Phone Orders: No KAVION, MANCINAS ([id number]) [id number] Page 4 of 8 Diagnosis Coding ICD-10 Coding Code Description E11.621 Type 2 diabetes mellitus with foot ulcer L97.518 Non-pressure chronic  ulcer of other part of  right foot with other specified severity E11.51 Type 2 diabetes mellitus with diabetic peripheral angiopathy without gangrene E11.42 Type 2 diabetes mellitus with diabetic polyneuropathy Follow-up Appointments ppointment in 1 week. - Joan Avetisyan [facility] (Room 9) 09/07/23 at 11:15am Return A ppointment in 2 weeks. - Maguire Killmer [facility] (Room 9) 09/14/23 at 11:15am Return A Return appointment in 3 weeks. - Hensley Aziz [facility]ONE PA [facility] (Room 9) Return appointment in 1 month. - Please make appointment with front desk Anesthetic (In clinic) Topical Lidocaine 4% applied to wound bed - Used in Clinic Bathing/ Shower/ Hygiene May shower and wash wound with soap and water. - After shower, may change wound dressing if so desired Home Health Wound #5 Right Amputation Site - Transmetatarsal New wound care orders this week; continue Home Health for wound care. May utilize formulary equivalent dressing for wound treatment orders unless otherwise specified. - use endoform as primary dressing place into the wound bed.Then place gauze,Kerlix tape over wound dressing. Other Home Health Orders/Instructions: - Wound Care by [facility] at least twice a week. Wound Treatment Wound #5 - Amputation Site - Transmetatarsal Wound Laterality: Right Cleanser: Soap and Water 3 x Per Week/30 Days Discharge Instructions: May shower and wash wound with dial antibacterial soap and water prior to dressing change. Cleanser: Vashe 5.8 (oz) 3 x Per Week/30 Days Discharge Instructions: Cleanse the wound with Vashe prior to applying a clean dressing using gauze sponges, not tissue or cotton balls. Cleanser: Wound Cleanser 3 x Per Week/30 Days Discharge Instructions: Cleanse the wound with wound cleanser prior to applying a clean dressing using gauze sponges, not tissue or cotton balls. Prim Dressing: Endoform 2x2 in 3 x Per Week/30 Days ary Discharge Instructions: Moisten with  saline Secondary Dressing: Woven Gauze Sponge, Non-Sterile 4x4 in 3 x Per Week/30 Days Discharge Instructions: Apply over primary dressing as directed. Secured With: [employer], 4.5x3.1 (in/yd) 3 x Per Week/30 Days Discharge Instructions: Secure with Kerlix as directed. Electronic Signature(s) Signed: 08/31/2023 1:34:12 PM By: Karie Schwalbe RN Signed: 08/31/2023 4:26:00 PM By: Allen Derry PA-C Entered By: Karie Schwalbe on 08/31/2023 12:08:14 -------------------------------------------------------------------------------- Problem List Details Patient Name: Date of Service: Jason Milian S. 08/31/2023 11:15 A M Medical Record Number: [id number] Patient Account Number: [id number] Date of Birth/Sex: Treating RN: 02-03-1941 (82 y.o. M) Primary Care Provider: [name] Other Clinician: Referring Provider: Treating Provider/Extender: Gaynelle Arabian in Treatment: 7 Active Problems ICD-10 DEQUINCY, BORN ([id number]) [id number] Page 5 of 8 Encounter Code Description Active Date MDM Diagnosis E11.621 Type 2 diabetes mellitus with foot ulcer 07/13/2023 No Yes L97.518 Non-pressure chronic ulcer of other part of right foot with other specified 07/13/2023 No Yes severity E11.51 Type 2 diabetes mellitus with diabetic peripheral angiopathy without gangrene 07/13/2023 No Yes E11.42 Type 2 diabetes mellitus with diabetic polyneuropathy 07/13/2023 No Yes Inactive Problems Resolved Problems Electronic Signature(s) Signed: 08/31/2023 11:27:18 AM By: Allen Derry PA-C Entered By: Allen Derry on 08/31/2023 11:27:17 -------------------------------------------------------------------------------- Progress Note Details Patient Name: Date of Service: Jason Milian S. 08/31/2023 11:15 A M Medical Record Number: [id number] Patient Account Number: [id number] Date of Birth/Sex: Treating RN: 02-28-1941 (82 y.o. M) Primary Care Provider: [name]  Other Clinician: Referring Provider: Treating Provider/Extender: Gaynelle Arabian in Treatment: 7 Subjective Chief Complaint Information obtained from Patient 01/31/17; patient is here for review of wound on his left great toe 07/13/2023; patient comes back to clinic for review of wounds on  his right transmetatarsal amputation site History of Present Illness (HPI) Chronic/Inactive Condition: He was followed for a period of time by Dr. Durwin Nora of vein and vascular as far as I can see last saw him on 1//24. 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 Last arterial studies I see showed noncompressible ABIs bilaterally but multiphasic waveforms on the right but a TBI of 0.44 on the left 01/31/17; this is a 82 year old man who has a history of type 2 diabetes on oral agents. He has a history of wound difficulties however this is on his right foot in fact he has had amputations of his medial 3 toes. I did not actually look at the right foot today. He tells me about a month ago he was cutting his mycotic nails informed a wound on the medial aspect of the left great toe. He saw Dr. Logan Bores of podiatry. He has been using Neosporin for a while and now simply wrapping it with gauze. He states he does have peripheral neuropathy. His hemoglobin A1c in this clinic was 1. 02/21/1989 non-compressible on the left. He did have vascular studies done in 2015 showing ABIs of 1.4 bilaterally, TBI's of 1.0 on the right 1.1 on the left with triphasic waveforms. He has not had imaging studies that I'm aware of 02/07/17; this is a 82 year old man with type 2 diabetes on oral agents. [place] last [place] with a small open area on the medial aspect of his left great toe. This is in the setting of a significant mycotic nail for which he follows with Dr. Logan Bores of  podiatry READMISSION 07/13/2023 This is a now 82 year old man who is had problems with a wound on his right foot amputation site after undergoing a transmetatarsal amputation in May of this year. He was admitted to hospital from 03/29/2023 through 04/02/2023 with sepsis felt to be secondary to an infected wound I think on the right first metatarsal head he underwent a transmetatarsal amputation by podiatry in the hospital. A culture of the wound had previously shown Klebsiella. He was seen in follow-up in the office on 04/19/2023 with a postop wound infection and was given doxycycline and Augmentin for 14 days. He has been dressing the wounds with Betadine wet-to-dry. Looking through epic it would appear that the follow-up with the podiatry group has been sporadic. The patient tells me he was at [facility] skilled facility although he is back at home now. His wound now is actually probably between the fourth and fifth metatarsal head on the amputation site. He has a smaller wound on the dorsal foot in this area and I am sure at 1 time these connected but I could not prove this today. He just came in with kerlix and dry gauze on the wound. Jason [name], Jason [name] ([id number]) [id number] Page 6 of 8 He was followed for a period of time by Dr. Durwin Nora of vein and vascular as far as I can see last saw him on 1//24. 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 Last arterial studies I see showed noncompressible ABIs bilaterally but multiphasic waveforms on the right but a TBI of 0.44 on the left Past medical history includes type 2 diabetes with peripheral neuropathy and PAD, diastolic heart failure,  mitral valve stenosis, atrial fibrillation on Xarelto, sleep apnea ABIs were not repeated today in the clinic as they are previously noncompressible as noted His  MRI is listed below; This did not suggest osteomyelitis in the area of the right foot where his wound is currently located MRI 03/29/23 IMPRESSION: 1. Deep skin wound about the plantar aspect of the first metatarsal head with mild marrow edema about the plantar aspect of the first metatarsal head concerning for osteomyelitis. 2. No fluid collection or abscess. 3. Postsurgical changes with prior amputation of the right foot through the metatarsophalangeal joints of the first and fifth digit and through metatarsal heads of the second through fourth digits. 4. Edema of the plantar muscles and tendons suggesting diabetic myopathy/myositis. 07-20-2023 upon evaluation today patient presents for follow-up evaluation here in the clinic. I did review his note from the last visit with Dr. Leanord Hawking. With that being said he is a current patient of vein and vascular here in [place] and has seen them last it looks like according to Dr. Jannetta Quint note in January of this year. At that point he had multiphasic waveforms on the right with a TBI of 0.44 on the left. I do not see any recent x-rays currently although he has seen podiatry I cannot see any of those x-rays. I really think having him go for a x-ray of the foot would be a good idea. 07-27-2023 upon evaluation today patient appears to be doing well currently in regard to his wound. He is actually showing signs of good improvement. I actually feel like this is significantly improved compared to last week's evaluation he does seem to have good granulation and epithelization and very pleased there is some callus I am going to need to remove however. 08-10-2023 upon evaluation today patient appears to be doing well currently in regard to his wound. The foot actually appears to be showing signs of excellent improvement and I am very pleased in that regard. Fortunately I do not see any evidence of worsening overall and I do believe that the patient is making  good headway towards closure. For now I am probably going to recommend that we continue with the [facility] although we may make a switch to collagen in the near future depending on how things progress. 08-24-2023 upon evaluation today patient appears to be doing well currently in regard to his wound. He has been tolerating the dressing changes I do believe it may be good to switch over to endoform at this point he is in agreement with that plan. Fortunately I do not see any evidence of infection or worsening overall and I am pleased in that regard. 08-31-2023 upon evaluation today patient's wound bed actually showed signs of good granulation epithelization at this point. Fortunately I do not see any signs of infection he seems to be doing quite well based on what I am seeing today. Objective Constitutional Well-nourished and well-hydrated in no acute distress. Vitals Time Taken: 11:51 AM, Height: 80 in, Weight: 280 lbs, BMI: 30.8, Temperature: 98 F, Pulse: 87 bpm, Respiratory Rate: 16 breaths/min, Blood Pressure: 128/78 mmHg. Respiratory normal breathing without difficulty. Psychiatric this patient is able to make decisions and demonstrates good insight into disease process. Alert and Oriented x 3. pleasant and cooperative. General Notes: Upon inspection patient's wound bed showed signs of good granulation epithelization at the perform debridement clearway callus and slough and biofilm down to good subcutaneous tissue he tolerated that today without complication postdebridement the wound bed is  significantly improved which is great news I am very pleased with what we are seeing today. Integumentary (Hair, Skin) Wound #5 status is Open. Original cause of wound was Gradually Appeared. The date acquired was: 04/19/2023. The wound has been in treatment 7 weeks. 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 The wound measures 0.4cm length x 0.5cm width x 0.8cm depth;  0.157cm^2 area and 0.126cm^3 volume. There is Fat Layer (Subcutaneous Tissue) exposed. There is tunneling at 2:00 with a maximum distance of 0.6cm. There is a medium amount of serosanguineous drainage noted. The wound margin is distinct with the outline attached to the wound base. There is large (67-100%) pink, friable, hyper - granulation within the wound bed. There is a small (1-33%) amount of necrotic tissue within the wound bed including Adherent Slough. The periwound skin appearance had no abnormalities noted for color. The periwound skin appearance exhibited: Callus, Scarring, Dry/Scaly. Periwound temperature was noted as No Abnormality. Assessment Active Problems ICD-10 Type 2 diabetes mellitus with foot ulcer Jason [name], Jason [name] ([id number]) [id number] Page 7 of 8 Non-pressure chronic ulcer of other part of right foot with other specified severity Type 2 diabetes mellitus with diabetic peripheral angiopathy without gangrene Type 2 diabetes mellitus with diabetic polyneuropathy Procedures Wound #5 Pre-procedure diagnosis of Wound #5 is a Diabetic Wound/Ulcer of the Lower Extremity located on the Right Amputation Site - Transmetatarsal .Severity of Tissue Pre Debridement is: Fat layer exposed. There was a Excisional Skin/Subcutaneous Tissue Debridement with a total area of 0.16 sq cm performed by Lenda Kelp, PA. With the following instrument(s): Curette to remove Non-Viable tissue/material. Material removed includes Callus, Subcutaneous Tissue, and Slough after achieving pain control using Lidocaine 4% T opical Solution. No specimens were taken. A time out was conducted at 11:55, prior to the start of the procedure. A Minimum amount of bleeding was controlled with Pressure. The procedure was tolerated well with a pain level of 0 throughout and a pain level of 0 following the procedure. Post Debridement Measurements: 0.4cm length x 0.5cm width x 0.8cm depth;  0.126cm^3 volume. Character of Wound/Ulcer Post Debridement is improved. Severity of Tissue Post Debridement is: Fat layer exposed. Post procedure Diagnosis Wound #5: Same as Pre-Procedure Plan Follow-up Appointments: Return Appointment in 1 week. - Avaiah Stempel STONE PA [facility] (Room 9) 09/07/23 at 11:15am Return Appointment in 2 weeks. - Heyden Jaber STONE PA [facility] (Room 9) 09/14/23 at 11:15am Return appointment in 3 weeks. - Jazzelle Zhang STONE PA [facility] (Room 9) Return appointment in 1 month. - Please make appointment with front desk Anesthetic: (In clinic) Topical Lidocaine 4% applied to wound bed - Used in Clinic Bathing/ Shower/ Hygiene: May shower and wash wound with soap and water. - After shower, may change wound dressing if so desired Home Health: Wound #5 Right Amputation Site - Transmetatarsal: New wound care orders this week; continue Home Health for wound care. May utilize formulary equivalent dressing for wound treatment orders unless otherwise specified. - use endoform as primary dressing place into the wound bed.Then place gauze,Kerlix tape over wound dressing. Other Home Health Orders/Instructions: - Wound Care by [facility] at least twice a week. WOUND #5: - Amputation Site - Transmetatarsal Wound Laterality: Right Cleanser: Soap and Water 3 x Per Week/30 Days Discharge Instructions: May shower and wash wound with dial antibacterial soap and water prior to dressing change. Cleanser: Vashe 5.8 (oz) 3 x Per Week/30 Days Discharge Instructions: Cleanse the wound with Vashe prior to applying a clean dressing  using gauze sponges, not tissue or cotton balls. Cleanser: Wound Cleanser 3 x Per Week/30 Days Discharge Instructions: Cleanse the wound with wound cleanser prior to applying a clean dressing using gauze sponges, not tissue or cotton balls. Prim Dressing: Endoform 2x2 in 3 x Per Week/30 Days ary Discharge Instructions: Moisten with saline Secondary Dressing: Woven Gauze  Sponge, Non-Sterile 4x4 in 3 x Per Week/30 Days Discharge Instructions: Apply over primary dressing as directed. Secured With: [employer], 4.5x3.1 (in/yd) 3 x Per Week/30 Days Discharge Instructions: Secure with Kerlix as directed. 1. I recommend that we have the patient continue to monitor for any signs of infection or worsening. Overall I think that we are definitely headed in the right direction and very pleased in that regard. 2. I am going to suggest that the patient should continue with the endoform which I think has done a really good job here up to this point I would suggest that we continue as such. Follow-up 1 Electronic Signature(s) Signed: 08/31/2023 2:05:19 PM By: Allen Derry PA-C Previous Signature: 08/31/2023 2:04:14 PM Version By: Allen Derry PA-C Entered By: Allen Derry on 08/31/2023 14:05:19 -------------------------------------------------------------------------------- SuperBill Details Patient Name: Date of Service: Jason [name] 08/31/2023 Medical Record Number: [id number] Patient Account Number: [id number] Date of Birth/Sex: Treating RN: 07-15-1941 (82 y.o. Jason [name], Jason [name] ([id number]) [id number] Page 8 of 8 Primary Care Provider: [name] Other Clinician: Referring Provider: Treating Provider/Extender: Gaynelle Arabian in Treatment: 7 Diagnosis Coding ICD-10 Codes Code Description E11.621 Type 2 diabetes mellitus with foot ulcer L97.518 Non-pressure chronic ulcer of other part of right foot with other specified severity E11.51 Type 2 diabetes mellitus with diabetic peripheral angiopathy without gangrene E11.42 Type 2 diabetes mellitus with diabetic polyneuropathy Facility Procedures : CPT4 Code: 62952841 Description: 11042 - DEB SUBQ TISSUE 20 SQ CM/< ICD-10 Diagnosis Description E11.621 Type 2 diabetes mellitus with foot ulcer L97.518 Non-pressure chronic ulcer of other part of right foot with  other specified sev E11.51 Type 2 diabetes mellitus with  diabetic peripheral angiopathy without gangrene E11.42 Type 2 diabetes mellitus with diabetic polyneuropathy Modifier: erity Quantity: 1 Physician Procedures : CPT4 Code Description Modifier [id number] 11042 - WC PHYS SUBQ TISS 20 SQ CM ICD-10 Diagnosis Description E11.621 Type 2 diabetes mellitus with foot ulcer L97.518 Non-pressure chronic ulcer of other part of right foot with other specified severity E11.51  Type 2 diabetes mellitus with diabetic peripheral angiopathy without gangrene E11.42 Type 2 diabetes mellitus with diabetic polyneuropathy Quantity: 1 Electronic Signature(s) Signed: 09/19/2023 10:57:49 AM By: Pearletha Alfred Signed: 09/20/2023 8:43:52 AM By: Allen Derry PA-C Previous Signature: 08/31/2023 2:06:05 PM Version By: Allen Derry PA-C Entered By: Pearletha Alfred on 09/19/2023 10:57:49

## 2023-08-31 NOTE — Progress Notes (Signed)
REIGN, BARTNICK ([id number]) [id number] Page 1 of 6 Visit Report for 08/31/2023 Arrival Information Details Patient Name: Date of Service: Jason [name], Jason [name] 08/31/2023 11:15 A M Medical Record Number: [id number] Patient Account Number: [id number] Date of Birth/Sex: Treating RN: 1941/10/24 (82 y.o. M) Primary Care Jason [name]: Jason [name] Other Clinician: Referring Jason [name]: Treating Jason [name]/Extender: Jason [name] in Treatment: 7 Visit Information History Since Last Visit Added or deleted any medications: No Patient Arrived: Ambulatory Any new allergies or adverse reactions: No Arrival Time: 11:43 Had a fall or experienced change in No Accompanied By: wife activities of daily living that may affect Transfer Assistance: None risk of falls: Patient Identification Verified: Yes Signs or symptoms of abuse/neglect since last visito No Secondary Verification Process Completed: Yes Hospitalized since last visit: No Patient Has Alerts: Yes Implantable device outside of the clinic excluding No Patient Alerts: ABI R 1.46 (03/29/23) cellular tissue based products placed in the center ABI L 1.63 (03/29/23) since last visit: Has Dressing in Place as Prescribed: Yes Pain Present Now: No Electronic Signature(s) Signed: 08/31/2023 1:34:12 PM By: Jason Schwalbe RN Entered By: Jason [name] on 08/31/2023 08:53:26 -------------------------------------------------------------------------------- Encounter Discharge Information Details Patient Name: Date of Service: Jason Milian S. 08/31/2023 11:15 A M Medical Record Number: [id number] Patient Account Number: [id number] Date of Birth/Sex: Treating RN: [birth year] (82 y.o. Jason [name] Primary Care Renad Jenniges: Jason [name] Other Clinician: Referring Arihana Ambrocio: Treating Docie Abramovich/Extender: Jason [name] in Treatment: 7 Encounter Discharge Information Items Post Procedure  Vitals Discharge Condition: Stable Temperature (F): 98 Ambulatory Status: Ambulatory Pulse (bpm): 87 Discharge Destination: Home Respiratory Rate (breaths/min): 16 Transportation: Private Auto Blood Pressure (mmHg): 128/78 Accompanied By: spouse Schedule Follow-up Appointment: Yes Clinical Summary of Care: Patient Declined Electronic Signature(s) Signed: 08/31/2023 1:34:12 PM By: Jason Schwalbe RN Entered By: Jason [name] on 08/31/2023 10:33:37 [name] ([id number]) [id number] Page 2 of 6 -------------------------------------------------------------------------------- Lower Extremity Assessment Details Patient Name: Date of Service: Jason [name], Jason [name] 08/31/2023 11:15 A M Medical Record Number: [id number] Patient Account Number: [id number] Date of Birth/Sex: Treating RN: [birth year] (82 y.o. Jason [name] Primary Care Nachum Derossett: Jason [name] Other Clinician: Referring Jason [name]: Treating Krystle Oberman/Extender: Bess Kinds Weeks in Treatment: 7 Edema Assessment Assessed: [Left: No] [Right: No] Edema: [Left: N] [Right: o] Calf Left: Right: Point of Measurement: 38 cm From Medial Instep 41.5 cm Ankle Left: Right: Point of Measurement: 11 cm From Medial Instep 27.2 cm Vascular Assessment Pulses: Dorsalis Pedis Palpable: [Right:Yes] Extremity colors, hair growth, and conditions: Extremity Color: [Right:Pale] Hair Growth on Extremity: [Right:No] Temperature of Extremity: [Right:Warm] Dependent Rubor: [Right:No No] Electronic Signature(s) Signed: 08/31/2023 1:34:12 PM By: Jason Schwalbe RN Entered By: Jason [name] on 08/31/2023 08:56:37 -------------------------------------------------------------------------------- Multi-Disciplinary Care Plan Details Patient Name: Date of Service: Jason Milian S. 08/31/2023 11:15 A M Medical Record Number: [id number] Patient Account Number: [id number] Date of Birth/Sex: Treating  RN: 03-25-1941 (82 y.o. Jason [name] Primary Care Jason [name]: Jason [name] Other Clinician: Referring Merle Cirelli: Treating Jason [name]/Extender: Jason [name] in Treatment: 7 Active Inactive Wound/Skin Impairment Nursing Diagnoses: Impaired tissue integrity Goals: Patient/caregiver will verbalize understanding of skin care regimen Date Initiated: 07/13/2023 Target Resolution Date: 11/14/2023 Goal Status: Active Jason [name] ([id number]) [id number] Page 3 of 6 Interventions: Assess ulceration(s) every visit Treatment Activities: Skin care regimen initiated : 07/13/2023 Notes: Electronic Signature(s) Signed: 08/31/2023 1:34:12 PM By: Jason Schwalbe RN Entered By: Jason [name] on 08/31/2023 09:52:48 --------------------------------------------------------------------------------  Jason [name] on 08/31/2023 09:13:18 -------------------------------------------------------------------------------- Vitals Details Patient Name: Date of Service: Jason [name], Jason [name] 08/31/2023 11:15 A M Medical Record Number: [id number] Patient Account Number: [id number] Date of Birth/Sex: Treating RN: August 19, 1941 (82 y.o. Jason [name] Primary Care Chrishauna Mee: Jason [name] Other Clinician: Referring Lewellyn Fultz: Treating Laquashia Mergenthaler/Extender: Daine Gip, Criss Rosales in Treatment: [address] [place] ([id number]) [id number] Page 6 of 6 Vital Signs Time Taken: 11:51 Temperature (F): 98 Height (in): 80 Pulse (bpm): 87 Weight (lbs): 280 Respiratory Rate (breaths/min): 16 Body Mass Index (BMI): 30.8 Blood Pressure (mmHg): 128/78 Reference Range: 80 - 120 mg / dl Electronic Signature(s) Signed: 08/31/2023 1:34:12 PM By: Jason Schwalbe RN Entered By: Jason [name] on 08/31/2023 08:54:07  Pain Assessment Details Patient Name: Date of Service: Jason [name], Jason [name] 08/31/2023 11:15 A M Medical Record Number: [id number] Patient Account Number: [id number] Date of Birth/Sex: Treating RN: Nov 27, 1940 (82 y.o. Jason [name] Primary Care Torria Fromer: Jason [name] Other Clinician: Referring Khair Chasteen: Treating [name]/Extender: Jason [name] in Treatment: 7 Active Problems Location of Pain Severity and Description of Pain Patient Has Paino No Site Locations Pain Management and Medication Current Pain Management: Electronic Signature(s) Signed: 08/31/2023 1:34:12 PM By: Jason Schwalbe RN Entered By: Jason [name] on 08/31/2023 08:54:53 -------------------------------------------------------------------------------- Patient/Caregiver Education Details Patient Name: Date of Service: Jason [name] 10/16/2024andnbsp11:15 A M Medical Record Number: [id number] Patient Account Number: [id number] Date of Birth/Gender: Treating RN: 05-17-1941 (82 y.o. Jason [name] Primary Care Physician: Jason [name] Other Clinician: BABY, [name] ([id number]) [id number] Page 4 of 6 Referring Physician: Treating Physician/Extender: Jason [name] in Treatment: 7 Education Assessment Education Provided To: Patient Education Topics Provided Wound/Skin Impairment: Methods: Explain/Verbal Responses: State content correctly Electronic Signature(s) Signed: 08/31/2023 1:34:12 PM By: Jason Schwalbe RN Entered By: Jason [name] on 08/31/2023 10:31:52 -------------------------------------------------------------------------------- Wound Assessment Details Patient Name: Date of Service: Jason [name]. 08/31/2023 11:15 A M Medical Record Number: [id number] Patient Account Number: [id number] Date of Birth/Sex: Treating RN: 1941/02/06 (82 y.o. Jason [name] Primary Care Daronte Shostak: Jason [name] Other Clinician: Referring Aryn Kops: Treating Jason [name]/Extender: Jason [name] in Treatment: 7 Wound Status Wound Number: 5 Primary Diabetic Wound/Ulcer of the Lower Extremity Etiology: Wound Location: Right Amputation Site - Transmetatarsal Wound Open Wounding Event: Gradually Appeared Status: Date Acquired: 04/19/2023 Comorbid Sleep Apnea, Arrhythmia, Congestive Heart Failure, Peripheral Weeks Of Treatment: 7 History: Arterial Disease, Peripheral Venous Disease, Type II Diabetes, Clustered Wound: No Neuropathy Photos Wound Measurements Length: (cm) 0.4 Width: (cm) 0.5 Depth: (cm) 0.8 Area: (cm) 0.157 Volume: (cm) 0.126 % Reduction in Area: 95.3% % Reduction in Volume: 90.6% Epithelialization: Small (1-33%) Tunneling: Yes Position (o'clock): 2 Maximum Distance: (cm) 0.6 Wound Description Classification: Grade 2 Wound Margin: Distinct, outline attached Exudate Amount: Medium Exudate Type: Serosanguineous Exudate Color: red, brown Jason [name], Jason [name] ([id number]) Foul Odor After Cleansing: No Slough/Fibrino Yes [phone].pdf  Page 5 of 6 Wound Bed Granulation Amount: Large (67-100%) Exposed Structure Granulation Quality: Pink, Hyper-granulation, Friable Fascia Exposed: No Necrotic Amount: Small (1-33%) Fat Layer (Subcutaneous Tissue) Exposed: Yes Necrotic Quality: Adherent Slough Tendon Exposed: No Muscle Exposed: No Joint Exposed: No Bone Exposed: No Periwound Skin Texture Texture Color No Abnormalities Noted: No No Abnormalities Noted: Yes Callus: Yes Temperature / Pain Scarring: Yes Temperature: No Abnormality Moisture No Abnormalities Noted: No Dry / Scaly: Yes Treatment Notes Wound #5 (Amputation Site - Transmetatarsal) Wound Laterality: Right Cleanser Soap and Water Discharge Instruction: May shower and wash wound with dial antibacterial soap and water prior to dressing change. Vashe 5.8 (oz) Discharge Instruction: Cleanse the wound with Vashe prior to applying a clean dressing using gauze sponges, not tissue or cotton balls. Wound Cleanser Discharge Instruction: Cleanse the wound with wound cleanser prior to applying a clean dressing using gauze sponges, not tissue or cotton balls. Peri-Wound Care Topical Primary Dressing Endoform 2x2 in Discharge Instruction: Moisten with saline Secondary Dressing Woven Gauze Sponge, Non-Sterile 4x4 in Discharge Instruction: Apply over primary dressing as directed. Secured With [employer], 4.5x3.1 (in/yd) Discharge Instruction: Secure with Kerlix as directed. Compression Wrap Compression Stockings Add-Ons Electronic Signature(s) Signed: 08/31/2023 1:34:12 PM By: Jason Schwalbe RN Entered By: Baruch Merl,

## 2023-09-02 ENCOUNTER — Encounter: Payer: Self-pay | Admitting: Podiatry

## 2023-09-02 ENCOUNTER — Ambulatory Visit: Payer: Medicare HMO | Admitting: Podiatry

## 2023-09-02 DIAGNOSIS — L97412 Non-pressure chronic ulcer of right heel and midfoot with fat layer exposed: Secondary | ICD-10-CM

## 2023-09-02 DIAGNOSIS — Z89431 Acquired absence of right foot: Secondary | ICD-10-CM

## 2023-09-02 DIAGNOSIS — E1142 Type 2 diabetes mellitus with diabetic polyneuropathy: Secondary | ICD-10-CM

## 2023-09-02 NOTE — Progress Notes (Signed)
Subjective:  Patient ID: Jason [name], male    DOB: 1941/02/28,  MRN: [id number]  DOS: 03/30/2023 Procedure: Transmetatarsal amputation right foot  82 y.o. male returns for post-op check.  Patient is approximately 5 months from trans met amp of right foot.  Patient seeing wound care.  Reports that the wound seems to be improving.  Weightbearing as tolerated in a postop shoe.  Has wound care via home health nurse who comes out twice a week.  Has been doing iodoform dressing daily.   Review of Systems: Negative except as noted in the HPI. Denies N/V/F/Ch.   Objective:  There were no vitals filed for this visit. There is no height or weight on file to calculate BMI. Constitutional Well developed. Well nourished.  Vascular Foot warm and well perfused. Capillary refill normal to all digits.  Calf is soft and supple, no posterior calf or knee pain, negative Homans' sign  Neurologic Normal speech. Oriented to person, place, and time. Epicritic sensation to light touch grossly absent to right foot  Dermatologic Distal circular ulceration present along the lateral aspect of the amputation site on the right foot.  There no tunneling. Wound measures approximately 0.7 x 0.4 x 0.3 cm Does not probe to bone.  Healthy granular bleeding tissue at the wound base. Improving    Orthopedic: S/p R foot tMA. Nontender to palpation noted about the surgical site secondary to neuropathy.   Multiple view plain film radiographs: Deferred Assessment:   1. Ulcer of right midfoot with fat layer exposed (HCC)   2. S/P transmetatarsal amputation of foot, right (HCC)   3. DM type 2 with diabetic peripheral neuropathy (HCC)        Plan:  Patient was evaluated and treated and all questions answered.  S/p foot surgery right transmetatarsal amputation with ulceration present at the amputation site -Much improved from prior with decreased tunneling wound overall healing and smaller in size than previously  appreciate wound care assistance with wound healing -Patient is currently finished with oral antibiotics.  Hold on further antibiotics at this time will consider future antibiotics if needed if the wound worsens -XR: Deferred at this visit -patient had x-ray on 07/21/2023 that did not show any residual osteomyelitis at the amputation site but did show heterotropic ossification -WB Status: WBAT in post op shoe -Sutures: Previously removed -Wounds debrided to healthy bleeding base as below, removed hyperkeratotic tissue surrounding the forefoot -Medications: No further abx indicated -Foot redressed with gauze dressing -Recommend patient continue daily dressing changes with iodoform and gauze daily patient to have iodoform -Continue wound care every 2 weeks  Procedure: Excisional Debridement of Wound Rationale: Removal of non-viable soft tissue from the wound to promote healing.  Anesthesia: none Post-Debridement Wound Measurements: 0.7 cm x 0.4 cm x 0.3 cm  Type of Debridement: Sharp Excisional Tissue Removed: Non-viable soft tissue Depth of Debridement: subcutaneous tissue. Technique: Sharp excisional debridement to bleeding, viable wound base.  Dressing: Dry, sterile, compression dressing. Disposition: Patient tolerated procedure well.   Return in about 4 weeks (around 09/30/2023) for f/u R foot ulcer.         Corinna Gab, DPM [facility]

## 2023-09-07 ENCOUNTER — Encounter (HOSPITAL_BASED_OUTPATIENT_CLINIC_OR_DEPARTMENT_OTHER): Payer: Medicare HMO | Admitting: Physician Assistant

## 2023-09-07 DIAGNOSIS — E11621 Type 2 diabetes mellitus with foot ulcer: Secondary | ICD-10-CM | POA: Diagnosis not present

## 2023-09-07 NOTE — Progress Notes (Addendum)
has seen them last it looks like according to  Dr. Jannetta Quint note in January of this year. At that point he had multiphasic waveforms on the right with a TBI of 0.44 on the left. I do not see any recent x-rays currently although he has seen podiatry I cannot see any of those x-rays. I really think having him go for a x-ray of the foot would be a good idea. 07-27-2023 upon evaluation today patient appears to be doing well currently in regard to his wound. He is actually showing signs of good improvement. I actually feel like this is significantly improved compared to last week's evaluation he does seem to have good granulation and epithelization and very pleased there is some callus I am going to need to remove however. 08-10-2023 upon evaluation today patient appears to be doing well currently in regard to his wound. The foot actually appears to be showing signs of excellent improvement and I am very pleased in that regard. Fortunately I do not see any evidence of worsening overall and I do believe that the patient is making good headway towards closure. For now I am probably going to recommend that we continue with the [facility] although we may make a switch to collagen in the near future depending on how things progress. 08-24-2023 upon evaluation today patient appears to be doing well currently in regard to his wound. He has been tolerating the dressing changes I do believe it may be good to switch over to endoform at this point he is in agreement with that plan. Fortunately I do not see any evidence of infection or worsening overall and I am pleased in that regard. 08-31-2023 upon evaluation today patient's wound bed actually showed signs of good granulation epithelization at this point. Fortunately I do not see any signs of infection he seems to be doing quite well based on what I am seeing today. 09-07-2023 upon evaluation today patient appears to be doing well currently in regard to his wound. Has been tolerating the dressing changes  without complication. Fortunately I do not see any signs of active infection locally or systemically at this time which is great news. No fevers, chills, nausea, vomiting, or diarrhea. Electronic Signature(s) Signed: 09/07/2023 11:59:27 AM By: Allen Derry PA-C Entered By: Allen Derry on 09/07/2023 08:59:27 -------------------------------------------------------------------------------- Physical Exam Details Patient Name: Date of Service: BLAS, FIKES 09/07/2023 11:15 A M Medical Record Number: [id number] Patient Account Number: [id number] Date of Birth/Sex: Treating RN: [birth year] (82 y.o. M) Primary Care Provider: [name] Other Clinician: Referring Provider: Treating Provider/Extender: Gaynelle Arabian in Treatment: 8 Constitutional Well-nourished and well-hydrated in no acute distress. Respiratory normal breathing without difficulty. Psychiatric this patient is able to make decisions and demonstrates good insight into disease process. Alert and Oriented x 3. pleasant and cooperative. Notes Patient's wound bed did require sharp debridement clearway some of the necrotic debris he tolerated the debridement today without complication and postdebridement wound bed appears to be doing much better which is great news. No fevers, chills, nausea, vomiting, or diarrhea. Electronic Signature(s) Signed: 09/07/2023 11:59:43 AM By: Allen Derry PA-C Entered By: Allen Derry on 09/07/2023 08:59:43 -------------------------------------------------------------------------------- Physician Orders Details Patient Name: Date of Service: Jason Milian S. 09/07/2023 11:15 A M Medical Record Number: [id number] Patient Account Number: [id number] Date of Birth/Sex: Treating RN: 1941-07-02 (82 y.o. [name] Primary Care Provider: [name] Other Clinician: Referring Provider: Treating Provider/Extender: Daine Gip, Jason Fila  DEMARRIO, BONADIO ([id number])  [id number] Page 4 of 8 Weeks in Treatment: 8 Verbal / Phone Orders: No Diagnosis Coding ICD-10 Coding Code Description E11.621 Type 2 diabetes mellitus with foot ulcer L97.518 Non-pressure chronic ulcer of other part of right foot with other specified severity E11.51 Type 2 diabetes mellitus with diabetic peripheral angiopathy without gangrene E11.42 Type 2 diabetes mellitus with diabetic polyneuropathy Follow-up Appointments ppointment in 1 week. - Nasim Garofano [facility] (Room 9) 09/14/23 at 11:15am Return A ppointment in 2 weeks. - Kalima Saylor STONE PA [facility] (Room 9) Please make appointments at front desk Return A Return appointment in 3 weeks. - Kayli Beal [facility] (Room 9) Please make appointments at front desk Return appointment in 1 month. - Please make appointment with front desk Please make appointments at front desk Anesthetic (In clinic) Topical Lidocaine 4% applied to wound bed - Used in Clinic Bathing/ Shower/ Hygiene May shower and wash wound with soap and water. - After shower, may change wound dressing if so desired Home Health Wound #5 Right Amputation Site - Transmetatarsal New wound care orders this week; continue Home Health for wound care. May utilize formulary equivalent dressing for wound treatment orders unless otherwise specified. - use endoform as primary dressing place into the wound bed.Then place gauze,Kerlix tape over wound dressing. Other Home Health Orders/Instructions: - Wound Care by [facility] at least twice a week. Wound Treatment Wound #5 - Amputation Site - Transmetatarsal Wound Laterality: Right Cleanser: Soap and Water 3 x Per Week/30 Days Discharge Instructions: May shower and wash wound with dial antibacterial soap and water prior to dressing change. Cleanser: Vashe 5.8 (oz) 3 x Per Week/30 Days Discharge Instructions: Cleanse the wound with Vashe prior to applying a clean dressing using gauze sponges,  not tissue or cotton balls. Cleanser: Wound Cleanser 3 x Per Week/30 Days Discharge Instructions: Cleanse the wound with wound cleanser prior to applying a clean dressing using gauze sponges, not tissue or cotton balls. Prim Dressing: Endoform 2x2 in 3 x Per Week/30 Days ary Discharge Instructions: Moisten with saline Secondary Dressing: Woven Gauze Sponge, Non-Sterile 4x4 in 3 x Per Week/30 Days Discharge Instructions: Apply over primary dressing as directed. Secured With: [employer], 4.5x3.1 (in/yd) 3 x Per Week/30 Days Discharge Instructions: Secure with Kerlix as directed. Electronic Signature(s) Signed: 09/07/2023 2:51:19 PM By: Karie Schwalbe RN Signed: 09/07/2023 4:45:44 PM By: Allen Derry PA-C Entered By: Karie Schwalbe on 09/07/2023 08:55:36 -------------------------------------------------------------------------------- Problem List Details Patient Name: Date of Service: Jason Milian S. 09/07/2023 11:15 A M Medical Record Number: [id number] Patient Account Number: [id number] Date of Birth/Sex: Treating RN: 03-08-1941 (82 y.o. M) Primary Care Provider: [name] Other Clinician: Referring Provider: Treating Provider/Extender: Daine Gip, Criss Rosales in Treatment: [address], [place] ([id number]) [id number] Page 5 of 8 Active Problems ICD-10 Encounter Code Description Active Date MDM Diagnosis E11.621 Type 2 diabetes mellitus with foot ulcer 07/13/2023 No Yes L97.518 Non-pressure chronic ulcer of other part of right foot with other specified 07/13/2023 No Yes severity E11.51 Type 2 diabetes mellitus with diabetic peripheral angiopathy without gangrene 07/13/2023 No Yes E11.42 Type 2 diabetes mellitus with diabetic polyneuropathy 07/13/2023 No Yes Inactive Problems Resolved Problems Electronic Signature(s) Signed: 09/07/2023 11:28:55 AM By: Allen Derry PA-C Entered By: Allen Derry on 09/07/2023  08:28:55 -------------------------------------------------------------------------------- Progress Note Details Patient Name: Date of Service: Jason Milian S. 09/07/2023 11:15 A M Medical Record Number: [id number] Patient Account Number: [id number] Date of Birth/Sex: Treating  wound care. May utilize formulary equivalent dressing for wound treatment orders unless otherwise specified. - use endoform as primary dressing place into the wound bed.Then place gauze,Kerlix tape over wound dressing. Other Home Health Orders/Instructions: - Wound Care by [facility] at least twice a week. WOUND #5: - Amputation Site - Transmetatarsal Wound Laterality: Right Cleanser: Soap and Water 3 x Per  Week/30 Days Discharge Instructions: May shower and wash wound with dial antibacterial soap and water prior to dressing change. Cleanser: Vashe 5.8 (oz) 3 x Per Week/30 Days Discharge Instructions: Cleanse the wound with Vashe prior to applying a clean dressing using gauze sponges, not tissue or cotton balls. Cleanser: Wound Cleanser 3 x Per Week/30 Days Discharge Instructions: Cleanse the wound with wound cleanser prior to applying a clean dressing using gauze sponges, not tissue or cotton balls. Prim Dressing: Endoform 2x2 in 3 x Per Week/30 Days ary Discharge Instructions: Moisten with saline Secondary Dressing: Woven Gauze Sponge, Non-Sterile 4x4 in 3 x Per Week/30 Days Discharge Instructions: Apply over primary dressing as directed. Secured With: [employer], 4.5x3.1 (in/yd) 3 x Per Week/30 Days Discharge Instructions: Secure with Kerlix as directed. 1. I would recommend based on what we are seeing that we have the patient going continue to monitor for any signs of infection or worsening. I do believe that the patient is making excellent headway towards closure which is great news. 2. I am going to recommend as well that the patient should continue to utilize the endoform which I think is doing a really good job of this is both feeling and I am getting smaller and I think that we are on the right track towards complete closure. We will see patient back for reevaluation in 1 week here in the clinic. If anything worsens or changes patient will contact our office for additional recommendations. Electronic Signature(s) Signed: 09/07/2023 12:00:14 PM By: Allen Derry PA-C Entered By: Allen Derry on 09/07/2023 09:00:14 [name] ([id number]) [id number] Page 8 of 8 -------------------------------------------------------------------------------- SuperBill Details Patient Name: Date of Service: KINTE, GRIEVES 09/07/2023 Medical Record Number:  [id number] Patient Account Number: [id number] Date of Birth/Sex: Treating RN: 1941-10-27 (82 y.o. M) Primary Care Provider: [name] Other Clinician: Referring Provider: Treating Provider/Extender: Gaynelle Arabian in Treatment: 8 Diagnosis Coding ICD-10 Codes Code Description [phone] Type 2 diabetes mellitus with foot ulcer L97.518 Non-pressure chronic ulcer of other part of right foot with other specified severity E11.51 Type 2 diabetes mellitus with diabetic peripheral angiopathy without gangrene E11.42 Type 2 diabetes mellitus with diabetic polyneuropathy Facility Procedures : CPT4 Code: [id number] Description: 11042 - DEB SUBQ TISSUE 20 SQ CM/< ICD-10 Diagnosis Description L97.518 Non-pressure chronic ulcer of other part of right foot with other specified sev Modifier: erity Quantity: 1 Physician Procedures : CPT4 Code Description Modifier [id number] 11042 - WC PHYS SUBQ TISS 20 SQ CM ICD-10 Diagnosis Description L97.518 Non-pressure chronic ulcer of other part of right foot with other specified severity Quantity: 1 Electronic Signature(s) Signed: 09/07/2023 12:00:21 PM By: Allen Derry PA-C Entered By: Allen Derry on 09/07/2023 09:00:21  DEMARRIO, BONADIO ([id number])  [id number] Page 4 of 8 Weeks in Treatment: 8 Verbal / Phone Orders: No Diagnosis Coding ICD-10 Coding Code Description E11.621 Type 2 diabetes mellitus with foot ulcer L97.518 Non-pressure chronic ulcer of other part of right foot with other specified severity E11.51 Type 2 diabetes mellitus with diabetic peripheral angiopathy without gangrene E11.42 Type 2 diabetes mellitus with diabetic polyneuropathy Follow-up Appointments ppointment in 1 week. - Nasim Garofano [facility] (Room 9) 09/14/23 at 11:15am Return A ppointment in 2 weeks. - Kalima Saylor STONE PA [facility] (Room 9) Please make appointments at front desk Return A Return appointment in 3 weeks. - Kayli Beal STONE PA [facility] (Room 9) Please make appointments at front desk Return appointment in 1 month. - Please make appointment with front desk Please make appointments at front desk Anesthetic (In clinic) Topical Lidocaine 4% applied to wound bed - Used in Clinic Bathing/ Shower/ Hygiene May shower and wash wound with soap and water. - After shower, may change wound dressing if so desired Home Health Wound #5 Right Amputation Site - Transmetatarsal New wound care orders this week; continue Home Health for wound care. May utilize formulary equivalent dressing for wound treatment orders unless otherwise specified. - use endoform as primary dressing place into the wound bed.Then place gauze,Kerlix tape over wound dressing. Other Home Health Orders/Instructions: - Wound Care by [facility] at least twice a week. Wound Treatment Wound #5 - Amputation Site - Transmetatarsal Wound Laterality: Right Cleanser: Soap and Water 3 x Per Week/30 Days Discharge Instructions: May shower and wash wound with dial antibacterial soap and water prior to dressing change. Cleanser: Vashe 5.8 (oz) 3 x Per Week/30 Days Discharge Instructions: Cleanse the wound with Vashe prior to applying a clean dressing using gauze sponges,  not tissue or cotton balls. Cleanser: Wound Cleanser 3 x Per Week/30 Days Discharge Instructions: Cleanse the wound with wound cleanser prior to applying a clean dressing using gauze sponges, not tissue or cotton balls. Prim Dressing: Endoform 2x2 in 3 x Per Week/30 Days ary Discharge Instructions: Moisten with saline Secondary Dressing: Woven Gauze Sponge, Non-Sterile 4x4 in 3 x Per Week/30 Days Discharge Instructions: Apply over primary dressing as directed. Secured With: [employer], 4.5x3.1 (in/yd) 3 x Per Week/30 Days Discharge Instructions: Secure with Kerlix as directed. Electronic Signature(s) Signed: 09/07/2023 2:51:19 PM By: Karie Schwalbe RN Signed: 09/07/2023 4:45:44 PM By: Allen Derry PA-C Entered By: Karie Schwalbe on 09/07/2023 08:55:36 -------------------------------------------------------------------------------- Problem List Details Patient Name: Date of Service: Jason Milian S. 09/07/2023 11:15 A M Medical Record Number: [id number] Patient Account Number: [id number] Date of Birth/Sex: Treating RN: 03-08-1941 (82 y.o. M) Primary Care Provider: [name] Other Clinician: Referring Provider: Treating Provider/Extender: Daine Gip, Criss Rosales in Treatment: [address], [place] ([id number]) [id number] Page 5 of 8 Active Problems ICD-10 Encounter Code Description Active Date MDM Diagnosis E11.621 Type 2 diabetes mellitus with foot ulcer 07/13/2023 No Yes L97.518 Non-pressure chronic ulcer of other part of right foot with other specified 07/13/2023 No Yes severity E11.51 Type 2 diabetes mellitus with diabetic peripheral angiopathy without gangrene 07/13/2023 No Yes E11.42 Type 2 diabetes mellitus with diabetic polyneuropathy 07/13/2023 No Yes Inactive Problems Resolved Problems Electronic Signature(s) Signed: 09/07/2023 11:28:55 AM By: Allen Derry PA-C Entered By: Allen Derry on 09/07/2023  08:28:55 -------------------------------------------------------------------------------- Progress Note Details Patient Name: Date of Service: Jason Milian S. 09/07/2023 11:15 A M Medical Record Number: [id number] Patient Account Number: [id number] Date of Birth/Sex: Treating  wound care. May utilize formulary equivalent dressing for wound treatment orders unless otherwise specified. - use endoform as primary dressing place into the wound bed.Then place gauze,Kerlix tape over wound dressing. Other Home Health Orders/Instructions: - Wound Care by [facility] at least twice a week. WOUND #5: - Amputation Site - Transmetatarsal Wound Laterality: Right Cleanser: Soap and Water 3 x Per  Week/30 Days Discharge Instructions: May shower and wash wound with dial antibacterial soap and water prior to dressing change. Cleanser: Vashe 5.8 (oz) 3 x Per Week/30 Days Discharge Instructions: Cleanse the wound with Vashe prior to applying a clean dressing using gauze sponges, not tissue or cotton balls. Cleanser: Wound Cleanser 3 x Per Week/30 Days Discharge Instructions: Cleanse the wound with wound cleanser prior to applying a clean dressing using gauze sponges, not tissue or cotton balls. Prim Dressing: Endoform 2x2 in 3 x Per Week/30 Days ary Discharge Instructions: Moisten with saline Secondary Dressing: Woven Gauze Sponge, Non-Sterile 4x4 in 3 x Per Week/30 Days Discharge Instructions: Apply over primary dressing as directed. Secured With: [employer], 4.5x3.1 (in/yd) 3 x Per Week/30 Days Discharge Instructions: Secure with Kerlix as directed. 1. I would recommend based on what we are seeing that we have the patient going continue to monitor for any signs of infection or worsening. I do believe that the patient is making excellent headway towards closure which is great news. 2. I am going to recommend as well that the patient should continue to utilize the endoform which I think is doing a really good job of this is both feeling and I am getting smaller and I think that we are on the right track towards complete closure. We will see patient back for reevaluation in 1 week here in the clinic. If anything worsens or changes patient will contact our office for additional recommendations. Electronic Signature(s) Signed: 09/07/2023 12:00:14 PM By: Allen Derry PA-C Entered By: Allen Derry on 09/07/2023 09:00:14 [name] ([id number]) [id number] Page 8 of 8 -------------------------------------------------------------------------------- SuperBill Details Patient Name: Date of Service: KINTE, GRIEVES 09/07/2023 Medical Record Number:  [id number] Patient Account Number: [id number] Date of Birth/Sex: Treating RN: 1941-10-27 (82 y.o. M) Primary Care Provider: [name] Other Clinician: Referring Provider: Treating Provider/Extender: Gaynelle Arabian in Treatment: 8 Diagnosis Coding ICD-10 Codes Code Description [phone] Type 2 diabetes mellitus with foot ulcer L97.518 Non-pressure chronic ulcer of other part of right foot with other specified severity E11.51 Type 2 diabetes mellitus with diabetic peripheral angiopathy without gangrene E11.42 Type 2 diabetes mellitus with diabetic polyneuropathy Facility Procedures : CPT4 Code: [id number] Description: 11042 - DEB SUBQ TISSUE 20 SQ CM/< ICD-10 Diagnosis Description L97.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 Jannetta Quint note in January of this year. At that point he had multiphasic waveforms on the right with a TBI of 0.44 on the left. I do not see any recent x-rays currently although he has seen podiatry I cannot see any of those x-rays. I really think having him go for a x-ray of the foot would be a good idea. 07-27-2023 upon evaluation today patient appears to be doing well currently in regard to his wound. He is actually showing signs of good improvement. I actually feel like this is significantly improved compared to last week's evaluation he does seem to have good granulation and epithelization and very pleased there is some callus I am going to need to remove however. 08-10-2023 upon evaluation today patient appears to be doing well currently in regard to his wound. The foot actually appears to be showing signs of excellent improvement and I am very pleased in that regard. Fortunately I do not see any evidence of worsening overall and I do believe that the patient is making good headway towards closure. For now I am probably going to recommend that we continue with the [facility] although we may make a switch to collagen in the near future depending on how things progress. 08-24-2023 upon evaluation today patient appears to be doing well currently in regard to his wound. He has been tolerating the dressing changes I do believe it may be good to switch over to endoform at this point he is in agreement with that plan. Fortunately I do not see any evidence of infection or worsening overall and I am pleased in that regard. 08-31-2023 upon evaluation today patient's wound bed actually showed signs of good granulation epithelization at this point. Fortunately I do not see any signs of infection he seems to be doing quite well based on what I am seeing today. 09-07-2023 upon evaluation today patient appears to be doing well currently in regard to his wound. Has been tolerating the dressing changes  without complication. Fortunately I do not see any signs of active infection locally or systemically at this time which is great news. No fevers, chills, nausea, vomiting, or diarrhea. Electronic Signature(s) Signed: 09/07/2023 11:59:27 AM By: Allen Derry PA-C Entered By: Allen Derry on 09/07/2023 08:59:27 -------------------------------------------------------------------------------- Physical Exam Details Patient Name: Date of Service: BLAS, FIKES 09/07/2023 11:15 A M Medical Record Number: [id number] Patient Account Number: [id number] Date of Birth/Sex: Treating RN: [birth year] (82 y.o. M) Primary Care Provider: [name] Other Clinician: Referring Provider: Treating Provider/Extender: Gaynelle Arabian in Treatment: 8 Constitutional Well-nourished and well-hydrated in no acute distress. Respiratory normal breathing without difficulty. Psychiatric this patient is able to make decisions and demonstrates good insight into disease process. Alert and Oriented x 3. pleasant and cooperative. Notes Patient's wound bed did require sharp debridement clearway some of the necrotic debris he tolerated the debridement today without complication and postdebridement wound bed appears to be doing much better which is great news. No fevers, chills, nausea, vomiting, or diarrhea. Electronic Signature(s) Signed: 09/07/2023 11:59:43 AM By: Allen Derry PA-C Entered By: Allen Derry on 09/07/2023 08:59:43 -------------------------------------------------------------------------------- Physician Orders Details Patient Name: Date of Service: Jason Milian S. 09/07/2023 11:15 A M Medical Record Number: [id number] Patient Account Number: [id number] Date of Birth/Sex: Treating RN: 1941-07-02 (82 y.o. [name] Primary Care Provider: [name] Other Clinician: Referring Provider: Treating Provider/Extender: Daine Gip, Jason Fila  wound care. May utilize formulary equivalent dressing for wound treatment orders unless otherwise specified. - use endoform as primary dressing place into the wound bed.Then place gauze,Kerlix tape over wound dressing. Other Home Health Orders/Instructions: - Wound Care by [facility] at least twice a week. WOUND #5: - Amputation Site - Transmetatarsal Wound Laterality: Right Cleanser: Soap and Water 3 x Per  Week/30 Days Discharge Instructions: May shower and wash wound with dial antibacterial soap and water prior to dressing change. Cleanser: Vashe 5.8 (oz) 3 x Per Week/30 Days Discharge Instructions: Cleanse the wound with Vashe prior to applying a clean dressing using gauze sponges, not tissue or cotton balls. Cleanser: Wound Cleanser 3 x Per Week/30 Days Discharge Instructions: Cleanse the wound with wound cleanser prior to applying a clean dressing using gauze sponges, not tissue or cotton balls. Prim Dressing: Endoform 2x2 in 3 x Per Week/30 Days ary Discharge Instructions: Moisten with saline Secondary Dressing: Woven Gauze Sponge, Non-Sterile 4x4 in 3 x Per Week/30 Days Discharge Instructions: Apply over primary dressing as directed. Secured With: [employer], 4.5x3.1 (in/yd) 3 x Per Week/30 Days Discharge Instructions: Secure with Kerlix as directed. 1. I would recommend based on what we are seeing that we have the patient going continue to monitor for any signs of infection or worsening. I do believe that the patient is making excellent headway towards closure which is great news. 2. I am going to recommend as well that the patient should continue to utilize the endoform which I think is doing a really good job of this is both feeling and I am getting smaller and I think that we are on the right track towards complete closure. We will see patient back for reevaluation in 1 week here in the clinic. If anything worsens or changes patient will contact our office for additional recommendations. Electronic Signature(s) Signed: 09/07/2023 12:00:14 PM By: Allen Derry PA-C Entered By: Allen Derry on 09/07/2023 09:00:14 [name] ([id number]) [id number] Page 8 of 8 -------------------------------------------------------------------------------- SuperBill Details Patient Name: Date of Service: KINTE, GRIEVES 09/07/2023 Medical Record Number:  [id number] Patient Account Number: [id number] Date of Birth/Sex: Treating RN: 1941-10-27 (82 y.o. M) Primary Care Provider: [name] Other Clinician: Referring Provider: Treating Provider/Extender: Gaynelle Arabian in Treatment: 8 Diagnosis Coding ICD-10 Codes Code Description [id number] Type 2 diabetes mellitus with foot ulcer L97.518 Non-pressure chronic ulcer of other part of right foot with other specified severity E11.51 Type 2 diabetes mellitus with diabetic peripheral angiopathy without gangrene E11.42 Type 2 diabetes mellitus with diabetic polyneuropathy Facility Procedures : CPT4 Code: [id number] Description: 11042 - DEB SUBQ TISSUE 20 SQ CM/< ICD-10 Diagnosis Description L97.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 Jannetta Quint note in January of this year. At that point he had multiphasic waveforms on the right with a TBI of 0.44 on the left. I do not see any recent x-rays currently although he has seen podiatry I cannot see any of those x-rays. I really think having him go for a x-ray of the foot would be a good idea. 07-27-2023 upon evaluation today patient appears to be doing well currently in regard to his wound. He is actually showing signs of good improvement. I actually feel like this is significantly improved compared to last week's evaluation he does seem to have good granulation and epithelization and very pleased there is some callus I am going to need to remove however. 08-10-2023 upon evaluation today patient appears to be doing well currently in regard to his wound. The foot actually appears to be showing signs of excellent improvement and I am very pleased in that regard. Fortunately I do not see any evidence of worsening overall and I do believe that the patient is making good headway towards closure. For now I am probably going to recommend that we continue with the [facility] although we may make a switch to collagen in the near future depending on how things progress. 08-24-2023 upon evaluation today patient appears to be doing well currently in regard to his wound. He has been tolerating the dressing changes I do believe it may be good to switch over to endoform at this point he is in agreement with that plan. Fortunately I do not see any evidence of infection or worsening overall and I am pleased in that regard. 08-31-2023 upon evaluation today patient's wound bed actually showed signs of good granulation epithelization at this point. Fortunately I do not see any signs of infection he seems to be doing quite well based on what I am seeing today. 09-07-2023 upon evaluation today patient appears to be doing well currently in regard to his wound. Has been tolerating the dressing changes  without complication. Fortunately I do not see any signs of active infection locally or systemically at this time which is great news. No fevers, chills, nausea, vomiting, or diarrhea. Electronic Signature(s) Signed: 09/07/2023 11:59:27 AM By: Allen Derry PA-C Entered By: Allen Derry on 09/07/2023 08:59:27 -------------------------------------------------------------------------------- Physical Exam Details Patient Name: Date of Service: BLAS, FIKES 09/07/2023 11:15 A M Medical Record Number: [id number] Patient Account Number: [id number] Date of Birth/Sex: Treating RN: [birth year] (82 y.o. M) Primary Care Provider: [name] Other Clinician: Referring Provider: Treating Provider/Extender: Gaynelle Arabian in Treatment: 8 Constitutional Well-nourished and well-hydrated in no acute distress. Respiratory normal breathing without difficulty. Psychiatric this patient is able to make decisions and demonstrates good insight into disease process. Alert and Oriented x 3. pleasant and cooperative. Notes Patient's wound bed did require sharp debridement clearway some of the necrotic debris he tolerated the debridement today without complication and postdebridement wound bed appears to be doing much better which is great news. No fevers, chills, nausea, vomiting, or diarrhea. Electronic Signature(s) Signed: 09/07/2023 11:59:43 AM By: Allen Derry PA-C Entered By: Allen Derry on 09/07/2023 08:59:43 -------------------------------------------------------------------------------- Physician Orders Details Patient Name: Date of Service: Jason Milian S. 09/07/2023 11:15 A M Medical Record Number: [id number] Patient Account Number: [id number] Date of Birth/Sex: Treating RN: 1941-07-02 (82 y.o. [name] Primary Care Provider: [name] Other Clinician: Referring Provider: Treating Provider/Extender: Daine Gip, Jason Fila

## 2023-09-08 NOTE — Progress Notes (Signed)
Jason MO MOICES, Jason S. 09/07/2023 11:15 Jason M Medical Record Number: [id number] Patient Account Number: [id number] Date of Birth/Sex: Treating RN: January 10, 1941 (82 y.o. M) Primary Care Tondalaya Perren: [name] Other Clinician: Referring Evaristo Tsuda: Treating Kelwin Gibler/Extender: Gaynelle Arabian in Treatment: 8 Active Problems Location of Pain Severity and Description of Pain Patient Has Paino No Site Locations Pain Management and Medication Current Pain Management: Electronic Signature(s) Signed: 09/08/2023 4:59:00 PM By: Thayer Dallas Entered By: Thayer Dallas on 09/07/2023 08:22:15 -------------------------------------------------------------------------------- Patient/Caregiver Education Details Patient Name: Date of Service: Jason [name] 10/23/2024andnbsp11:15 Jason M Medical Record Number: [id number] Patient Account Number: [id number] Date of Birth/Gender: Treating RN: [birth year] (82 y.o. Jason [name] Primary Care Physician: [name] Other Clinician: Referring Physician: Treating Physician/Extender: Daine Gip, Criss Rosales in Treatment: [address],  [place] ([id number]) [id number] Page 4 of 6 Education Assessment Education Provided To: Patient Education Topics Provided Wound/Skin Impairment: Methods: Explain/Verbal Responses: State content correctly Electronic Signature(s) Signed: 09/07/2023 2:51:19 PM By: Karie Schwalbe RN Entered By: Karie Schwalbe on 09/07/2023 08:56:50 -------------------------------------------------------------------------------- Wound Assessment Details Patient Name: Date of Service: Jason [name]. 09/07/2023 11:15 Jason M Medical Record Number: [id number] Patient Account Number: [id number] Date of Birth/Sex: Treating RN: [birth year] (82 y.o. 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 Wound Open Wounding Event: Gradually Appeared Status: Date Acquired: 04/19/2023 Comorbid Sleep Apnea, Arrhythmia, Congestive Heart Failure, Peripheral Weeks Of Treatment: 8 History: Arterial Disease, Peripheral Venous Disease, Type II Diabetes, Clustered Wound: No Neuropathy Photos Wound Measurements Length: (cm) 0.3 Width: (cm) 0.4 Depth: (cm) 0.8 Area: (cm) 0.094 Volume: (cm) 0.075 % Reduction in Area: 97.2% % Reduction in Volume: 94.4% Epithelialization: Small (1-33%) Tunneling: No Undermining: No Wound Description Classification: Grade 2 Wound Margin: Distinct, outline attached Exudate Amount: Medium Exudate Type: Serosanguineous Exudate Color: red, brown Foul Odor After Cleansing: No Slough/Fibrino Yes Wound Bed Granulation Amount: Large (67-100%) Exposed Structure Granulation Quality: Pink Fascia Exposed: No Jason [name], Jason [name] ([id number]) [id number].pdf Page 5 of 6 Necrotic Amount: Small  (1-33%) Fat Layer (Subcutaneous Tissue) Exposed: Yes Necrotic Quality: Adherent Slough Tendon Exposed: No Muscle Exposed: No Joint Exposed: No Bone Exposed: No Periwound Skin Texture Texture Color No Abnormalities Noted: No No Abnormalities Noted: Yes Callus: Yes Temperature / Pain Scarring: Yes Temperature: No Abnormality Moisture No Abnormalities Noted: No Dry / Scaly: Yes Treatment Notes Wound #5 (Amputation Site - Transmetatarsal) Wound Laterality: Right Cleanser Soap and Water Discharge Instruction: May shower and wash wound with dial antibacterial soap and water prior to dressing change. Vashe 5.8 (oz) Discharge Instruction: Cleanse the wound with Vashe prior to applying Jason clean dressing using gauze sponges, not tissue or cotton balls. Wound Cleanser Discharge Instruction: Cleanse the wound with wound cleanser prior to applying Jason clean dressing using gauze sponges, not tissue or cotton balls. Peri-Wound Care Topical Primary Dressing Endoform 2x2 in Discharge Instruction: Moisten with saline Secondary Dressing Woven Gauze Sponge, Non-Sterile 4x4 in Discharge Instruction: Apply over primary dressing as directed. Secured With [employer], 4.5x3.1 (in/yd) Discharge Instruction: Secure with Kerlix as directed. Compression Wrap Compression Stockings Add-Ons Electronic Signature(s) Signed: 09/07/2023 2:51:19 PM By: Karie Schwalbe RN Entered By: Karie Schwalbe on 09/07/2023 08:49:53 -------------------------------------------------------------------------------- Vitals Details Patient Name: Date of Service: Jason Milian S. 09/07/2023 11:15 Jason  M Medical Record Number: [id number] Patient Account Number: [id number] Date of Birth/Sex: Treating RN: 1941/07/10 (82 y.o. M) Primary Care See Beharry: [name] Other Clinician: Referring Sheana Bir: Treating Goble Fudala/Extender: Gaynelle Arabian in Treatment: 8 Vital Signs Time Taken:  11:21 Temperature (F): 97.6 Height (in): 80 Pulse (bpm): [address] ([id number]) [phone].pdf Page 6 of 6 Weight (lbs): 280 Respiratory Rate (breaths/min): 16 Body Mass Index (BMI): 30.8 Blood Pressure (mmHg): 112/74 Reference Range: 80 - 120 mg / dl Electronic Signature(s) Signed: 09/08/2023 4:59:00 PM By: Thayer Dallas Entered By: Thayer Dallas on 09/07/2023 08:21:52  Jason MO MOICES, Jason S. 09/07/2023 11:15 Jason M Medical Record Number: [id number] Patient Account Number: [id number] Date of Birth/Sex: Treating RN: January 10, 1941 (82 y.o. M) Primary Care Tondalaya Perren: [name] Other Clinician: Referring Evaristo Tsuda: Treating Kelwin Gibler/Extender: Gaynelle Arabian in Treatment: 8 Active Problems Location of Pain Severity and Description of Pain Patient Has Paino No Site Locations Pain Management and Medication Current Pain Management: Electronic Signature(s) Signed: 09/08/2023 4:59:00 PM By: Thayer Dallas Entered By: Thayer Dallas on 09/07/2023 08:22:15 -------------------------------------------------------------------------------- Patient/Caregiver Education Details Patient Name: Date of Service: Jason [name] 10/23/2024andnbsp11:15 Jason M Medical Record Number: [id number] Patient Account Number: [id number] Date of Birth/Gender: Treating RN: [birth year] (82 y.o. Jason [name] Primary Care Physician: [name] Other Clinician: Referring Physician: Treating Physician/Extender: Daine Gip, Criss Rosales in Treatment: [address],  [place] ([id number]) [id number] Page 4 of 6 Education Assessment Education Provided To: Patient Education Topics Provided Wound/Skin Impairment: Methods: Explain/Verbal Responses: State content correctly Electronic Signature(s) Signed: 09/07/2023 2:51:19 PM By: Karie Schwalbe RN Entered By: Karie Schwalbe on 09/07/2023 08:56:50 -------------------------------------------------------------------------------- Wound Assessment Details Patient Name: Date of Service: Jason [name]. 09/07/2023 11:15 Jason M Medical Record Number: [id number] Patient Account Number: [id number] Date of Birth/Sex: Treating RN: [birth year] (82 y.o. 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 Wound Open Wounding Event: Gradually Appeared Status: Date Acquired: 04/19/2023 Comorbid Sleep Apnea, Arrhythmia, Congestive Heart Failure, Peripheral Weeks Of Treatment: 8 History: Arterial Disease, Peripheral Venous Disease, Type II Diabetes, Clustered Wound: No Neuropathy Photos Wound Measurements Length: (cm) 0.3 Width: (cm) 0.4 Depth: (cm) 0.8 Area: (cm) 0.094 Volume: (cm) 0.075 % Reduction in Area: 97.2% % Reduction in Volume: 94.4% Epithelialization: Small (1-33%) Tunneling: No Undermining: No Wound Description Classification: Grade 2 Wound Margin: Distinct, outline attached Exudate Amount: Medium Exudate Type: Serosanguineous Exudate Color: red, brown Foul Odor After Cleansing: No Slough/Fibrino Yes Wound Bed Granulation Amount: Large (67-100%) Exposed Structure Granulation Quality: Pink Fascia Exposed: No Jason [name], Jason [name] ([id number]) [id number].pdf Page 5 of 6 Necrotic Amount: Small  (1-33%) Fat Layer (Subcutaneous Tissue) Exposed: Yes Necrotic Quality: Adherent Slough Tendon Exposed: No Muscle Exposed: No Joint Exposed: No Bone Exposed: No Periwound Skin Texture Texture Color No Abnormalities Noted: No No Abnormalities Noted: Yes Callus: Yes Temperature / Pain Scarring: Yes Temperature: No Abnormality Moisture No Abnormalities Noted: No Dry / Scaly: Yes Treatment Notes Wound #5 (Amputation Site - Transmetatarsal) Wound Laterality: Right Cleanser Soap and Water Discharge Instruction: May shower and wash wound with dial antibacterial soap and water prior to dressing change. Vashe 5.8 (oz) Discharge Instruction: Cleanse the wound with Vashe prior to applying Jason clean dressing using gauze sponges, not tissue or cotton balls. Wound Cleanser Discharge Instruction: Cleanse the wound with wound cleanser prior to applying Jason clean dressing using gauze sponges, not tissue or cotton balls. Peri-Wound Care Topical Primary Dressing Endoform 2x2 in Discharge Instruction: Moisten with saline Secondary Dressing Woven Gauze Sponge, Non-Sterile 4x4 in Discharge Instruction: Apply over primary dressing as directed. Secured With [employer], 4.5x3.1 (in/yd) Discharge Instruction: Secure with Kerlix as directed. Compression Wrap Compression Stockings Add-Ons Electronic Signature(s) Signed: 09/07/2023 2:51:19 PM By: Karie Schwalbe RN Entered By: Karie Schwalbe on 09/07/2023 08:49:53 -------------------------------------------------------------------------------- Vitals Details Patient Name: Date of Service: Jason Milian S. 09/07/2023 11:15 Jason

## 2023-09-14 ENCOUNTER — Encounter (HOSPITAL_BASED_OUTPATIENT_CLINIC_OR_DEPARTMENT_OTHER): Payer: Medicare HMO | Admitting: Physician Assistant

## 2023-09-14 DIAGNOSIS — E11621 Type 2 diabetes mellitus with foot ulcer: Secondary | ICD-10-CM | POA: Diagnosis not present

## 2023-09-14 NOTE — Progress Notes (Addendum)
[id number] Date of Birth/Sex: Treating RN: 20-May-1941 (82 y.o. Jason [name] Primary Care Provider: [name] Other Clinician: Referring Provider: Treating Provider/Extender: Daine Gip, Criss Rosales in Treatment: [address], [place] ([id number]) [id number] Page 4 of 8 Verbal / Phone Orders: No Diagnosis Coding ICD-10 Coding Code Description E11.621 Type 2 diabetes mellitus with foot ulcer L97.518 Non-pressure chronic ulcer of other part of right foot with other specified severity E11.51 Type 2 diabetes mellitus with diabetic peripheral angiopathy without gangrene E11.42 Type 2 diabetes mellitus with diabetic polyneuropathy Follow-up Appointments ppointment in 1 week. - Casen Pryor STONE PA WEDNESDAY (Room 9) 10/01/23 at 12:30pm Return A ppointment in 2 weeks. - Caitlyne Ingham STONE PA [facility] (Room 9) Please make appointments at front desk Return A Return appointment in 3 weeks. - Shirly Bartosiewicz STONE PA [facility] (Room 9) Please make appointments at front desk Return appointment in 1 month. - Please make appointment with front desk Please make appointments at front desk Anesthetic (In clinic) Topical Lidocaine 4% applied to wound bed - Used in Clinic Bathing/ Shower/ Hygiene May shower and wash wound with soap and water. - After shower, may change wound dressing if so desired Home Health Wound #5 Right Amputation Site - Transmetatarsal No change in wound care orders this week; continue Home Health for wound care. May utilize formulary equivalent dressing for wound treatment orders unless otherwise specified. - use Endoform as primary dressing place into the wound bed.Then place gauze, Kerlix tape over wound dressing. Other Home Health Orders/Instructions: - Wound Care by [facility] at least twice a week. Wound Treatment Wound #5 - Amputation Site - Transmetatarsal Wound Laterality: Right Cleanser: Soap and Water 3 x Per Week/30 Days Discharge Instructions: May shower and wash wound with dial antibacterial soap and water prior  to dressing change. Cleanser: Vashe 5.8 (oz) 3 x Per Week/30 Days Discharge Instructions: Cleanse the wound with Vashe prior to applying a clean dressing using gauze sponges, not tissue or cotton balls. Cleanser: Wound Cleanser 3 x Per Week/30 Days Discharge Instructions: Cleanse the wound with wound cleanser prior to applying a clean dressing using gauze sponges, not tissue or cotton balls. Prim Dressing: Endoform 2x2 in 3 x Per Week/30 Days ary Discharge Instructions: Moisten with saline Secondary Dressing: Woven Gauze Sponge, Non-Sterile 4x4 in 3 x Per Week/30 Days Discharge Instructions: Apply over primary dressing as directed. Secured With: [employer], 4.5x3.1 (in/yd) 3 x Per Week/30 Days Discharge Instructions: Secure with Kerlix as directed. Electronic Signature(s) Signed: 09/14/2023 12:54:46 PM By: Karie Schwalbe RN Signed: 09/14/2023 4:33:40 PM By: Allen Derry PA-C Entered By: Karie Schwalbe on 09/14/2023 09:02:12 -------------------------------------------------------------------------------- Problem List Details Patient Name: Date of Service: Jason Jobs. 09/14/2023 11:15 A M Medical Record Number: [id number] Patient Account Number: [id number] Date of Birth/Sex: Treating RN: 1941/08/15 (82 y.o. M) Primary Care Provider: [name] Other Clinician: Referring Provider: Treating Provider/Extender: Daine Gip, Criss Rosales in Treatment: [address], [place] ([id number]) [id number] Page 5 of 8 Active Problems ICD-10 Encounter Code Description Active Date MDM Diagnosis E11.621 Type 2 diabetes mellitus with foot ulcer 07/13/2023 No Yes L97.518 Non-pressure chronic ulcer of other part of right foot with other specified 07/13/2023 No Yes severity E11.51 Type 2 diabetes mellitus with diabetic peripheral angiopathy without gangrene 07/13/2023 No Yes E11.42 Type 2 diabetes mellitus with diabetic polyneuropathy 07/13/2023 No  Yes Inactive Problems Resolved Problems Electronic Signature(s) Signed: 09/14/2023 11:37:13 AM By: Allen Derry PA-C Entered By: Allen Derry on 09/14/2023  [id number] Date of Birth/Sex: Treating RN: 20-May-1941 (82 y.o. Jason [name] Primary Care Provider: [name] Other Clinician: Referring Provider: Treating Provider/Extender: Daine Gip, Criss Rosales in Treatment: [address], [place] ([id number]) [id number] Page 4 of 8 Verbal / Phone Orders: No Diagnosis Coding ICD-10 Coding Code Description E11.621 Type 2 diabetes mellitus with foot ulcer L97.518 Non-pressure chronic ulcer of other part of right foot with other specified severity E11.51 Type 2 diabetes mellitus with diabetic peripheral angiopathy without gangrene E11.42 Type 2 diabetes mellitus with diabetic polyneuropathy Follow-up Appointments ppointment in 1 week. - Casen Pryor STONE PA WEDNESDAY (Room 9) 10/01/23 at 12:30pm Return A ppointment in 2 weeks. - Caitlyne Ingham STONE PA [facility] (Room 9) Please make appointments at front desk Return A Return appointment in 3 weeks. - Shirly Bartosiewicz STONE PA [facility] (Room 9) Please make appointments at front desk Return appointment in 1 month. - Please make appointment with front desk Please make appointments at front desk Anesthetic (In clinic) Topical Lidocaine 4% applied to wound bed - Used in Clinic Bathing/ Shower/ Hygiene May shower and wash wound with soap and water. - After shower, may change wound dressing if so desired Home Health Wound #5 Right Amputation Site - Transmetatarsal No change in wound care orders this week; continue Home Health for wound care. May utilize formulary equivalent dressing for wound treatment orders unless otherwise specified. - use Endoform as primary dressing place into the wound bed.Then place gauze, Kerlix tape over wound dressing. Other Home Health Orders/Instructions: - Wound Care by [facility] at least twice a week. Wound Treatment Wound #5 - Amputation Site - Transmetatarsal Wound Laterality: Right Cleanser: Soap and Water 3 x Per Week/30 Days Discharge Instructions: May shower and wash wound with dial antibacterial soap and water prior  to dressing change. Cleanser: Vashe 5.8 (oz) 3 x Per Week/30 Days Discharge Instructions: Cleanse the wound with Vashe prior to applying a clean dressing using gauze sponges, not tissue or cotton balls. Cleanser: Wound Cleanser 3 x Per Week/30 Days Discharge Instructions: Cleanse the wound with wound cleanser prior to applying a clean dressing using gauze sponges, not tissue or cotton balls. Prim Dressing: Endoform 2x2 in 3 x Per Week/30 Days ary Discharge Instructions: Moisten with saline Secondary Dressing: Woven Gauze Sponge, Non-Sterile 4x4 in 3 x Per Week/30 Days Discharge Instructions: Apply over primary dressing as directed. Secured With: [employer], 4.5x3.1 (in/yd) 3 x Per Week/30 Days Discharge Instructions: Secure with Kerlix as directed. Electronic Signature(s) Signed: 09/14/2023 12:54:46 PM By: Karie Schwalbe RN Signed: 09/14/2023 4:33:40 PM By: Allen Derry PA-C Entered By: Karie Schwalbe on 09/14/2023 09:02:12 -------------------------------------------------------------------------------- Problem List Details Patient Name: Date of Service: Jason Jobs. 09/14/2023 11:15 A M Medical Record Number: [id number] Patient Account Number: [id number] Date of Birth/Sex: Treating RN: 1941/08/15 (82 y.o. M) Primary Care Provider: [name] Other Clinician: Referring Provider: Treating Provider/Extender: Daine Gip, Criss Rosales in Treatment: [address], [place] ([id number]) [id number] Page 5 of 8 Active Problems ICD-10 Encounter Code Description Active Date MDM Diagnosis E11.621 Type 2 diabetes mellitus with foot ulcer 07/13/2023 No Yes L97.518 Non-pressure chronic ulcer of other part of right foot with other specified 07/13/2023 No Yes severity E11.51 Type 2 diabetes mellitus with diabetic peripheral angiopathy without gangrene 07/13/2023 No Yes E11.42 Type 2 diabetes mellitus with diabetic polyneuropathy 07/13/2023 No  Yes Inactive Problems Resolved Problems Electronic Signature(s) Signed: 09/14/2023 11:37:13 AM By: Allen Derry PA-C Entered By: Allen Derry on 09/14/2023  desk Please make appointments at front desk Anesthetic: (In clinic) Topical Lidocaine 4% applied to wound bed - Used in Clinic Bathing/ Shower/ Hygiene: May shower and wash wound with soap and water. 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 May utilize formulary equivalent dressing for wound treatment orders unless otherwise specified. - use Endoform as primary dressing place into the wound bed.Then place gauze, Kerlix tape over wound dressing. Other Home Health Orders/Instructions: - Wound Care by [facility] at least twice a week. WOUND #5: - Amputation Site - Transmetatarsal Wound Laterality: Right Cleanser: Soap and Water 3 x Per Week/30 Days Discharge Instructions: May shower and wash wound with dial antibacterial soap and water prior to dressing change. Cleanser: Vashe 5.8 (oz) 3 x Per Week/30 Days Discharge Instructions: Cleanse the wound with Vashe prior to applying a clean dressing using gauze sponges, not tissue or cotton balls. Cleanser: Wound Cleanser 3 x Per Week/30 Days Discharge Instructions: Cleanse the wound with wound cleanser prior to applying a clean dressing using gauze sponges, not tissue or cotton balls. Prim Dressing: Endoform 2x2 in 3 x Per Week/30 Days ary Discharge Instructions: Moisten with saline Secondary Dressing: Woven Gauze Sponge, Non-Sterile 4x4 in 3 x Per Week/30 Days Discharge Instructions: Apply over primary dressing as directed. Secured With: [employer], 4.5x3.1 (in/yd) 3 x Per Week/30 Days Discharge Instructions: Secure with Kerlix as directed. 1. I am going to recommend that we have the patient going to continue to monitor for any signs of infection or worsening. Based on what I see I do believe that we will make an excellent headway towards closure. 2. Recommend continue with endoform followed by ABD pad and roll gauze to secure in place. We will see patient back for reevaluation in 1 week here in the clinic. If anything worsens or changes patient will contact our office for additional recommendations. Electronic Signature(s) Signed: 09/14/2023 1:23:23 PM By: Allen Derry PA-C Entered By: Allen Derry on 09/14/2023 10:23:22 Jason [name] ([id number])  [id number] Page 8 of 8 -------------------------------------------------------------------------------- SuperBill Details Patient Name: Date of Service: Jason [name], Jason [name] 09/14/2023 Medical Record Number: [id number] Patient Account Number: [id number] Date of Birth/Sex: Treating RN: 1940-11-26 (82 y.o. M) Primary Care Provider: [name] Other Clinician: Referring Provider: Treating Provider/Extender: Gaynelle Arabian in Treatment: 9 Diagnosis Coding ICD-10 Codes Code Description E11.621 Type 2 diabetes mellitus with foot ulcer L97.518 Non-pressure chronic ulcer of other part of right foot with other specified severity E11.51 Type 2 diabetes mellitus with diabetic peripheral angiopathy without gangrene E11.42 Type 2 diabetes mellitus with diabetic polyneuropathy Facility Procedures : CPT4 Code: 02725366 Description: 11042 - DEB SUBQ TISSUE 20 SQ CM/< ICD-10 Diagnosis Description L97.518 Non-pressure chronic ulcer of other part of right foot with other specified sev Modifier: erity Quantity: 1 Physician Procedures : CPT4 Code Description Modifier [id number] 11042 - WC PHYS SUBQ TISS 20 SQ CM ICD-10 Diagnosis Description L97.518 Non-pressure chronic ulcer of other part of right foot with other specified severity Quantity: 1 Electronic Signature(s) Signed: 09/14/2023 1:23:29 PM By: Allen Derry PA-C Entered By: Allen Derry on 09/14/2023 10:23:28  desk Please make appointments at front desk Anesthetic: (In clinic) Topical Lidocaine 4% applied to wound bed - Used in Clinic Bathing/ Shower/ Hygiene: May shower and wash wound with soap and water. 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 May utilize formulary equivalent dressing for wound treatment orders unless otherwise specified. - use Endoform as primary dressing place into the wound bed.Then place gauze, Kerlix tape over wound dressing. Other Home Health Orders/Instructions: - Wound Care by [facility] at least twice a week. WOUND #5: - Amputation Site - Transmetatarsal Wound Laterality: Right Cleanser: Soap and Water 3 x Per Week/30 Days Discharge Instructions: May shower and wash wound with dial antibacterial soap and water prior to dressing change. Cleanser: Vashe 5.8 (oz) 3 x Per Week/30 Days Discharge Instructions: Cleanse the wound with Vashe prior to applying a clean dressing using gauze sponges, not tissue or cotton balls. Cleanser: Wound Cleanser 3 x Per Week/30 Days Discharge Instructions: Cleanse the wound with wound cleanser prior to applying a clean dressing using gauze sponges, not tissue or cotton balls. Prim Dressing: Endoform 2x2 in 3 x Per Week/30 Days ary Discharge Instructions: Moisten with saline Secondary Dressing: Woven Gauze Sponge, Non-Sterile 4x4 in 3 x Per Week/30 Days Discharge Instructions: Apply over primary dressing as directed. Secured With: [employer], 4.5x3.1 (in/yd) 3 x Per Week/30 Days Discharge Instructions: Secure with Kerlix as directed. 1. I am going to recommend that we have the patient going to continue to monitor for any signs of infection or worsening. Based on what I see I do believe that we will make an excellent headway towards closure. 2. Recommend continue with endoform followed by ABD pad and roll gauze to secure in place. We will see patient back for reevaluation in 1 week here in the clinic. If anything worsens or changes patient will contact our office for additional recommendations. Electronic Signature(s) Signed: 09/14/2023 1:23:23 PM By: Allen Derry PA-C Entered By: Allen Derry on 09/14/2023 10:23:22 Jason [name] ([id number])  [id number] Page 8 of 8 -------------------------------------------------------------------------------- SuperBill Details Patient Name: Date of Service: Jason [name], Jason [name] 09/14/2023 Medical Record Number: [id number] Patient Account Number: [id number] Date of Birth/Sex: Treating RN: 1940-11-26 (82 y.o. M) Primary Care Provider: [name] Other Clinician: Referring Provider: Treating Provider/Extender: Gaynelle Arabian in Treatment: 9 Diagnosis Coding ICD-10 Codes Code Description E11.621 Type 2 diabetes mellitus with foot ulcer L97.518 Non-pressure chronic ulcer of other part of right foot with other specified severity E11.51 Type 2 diabetes mellitus with diabetic peripheral angiopathy without gangrene E11.42 Type 2 diabetes mellitus with diabetic polyneuropathy Facility Procedures : CPT4 Code: [id number] Description: 11042 - DEB SUBQ TISSUE 20 SQ CM/< ICD-10 Diagnosis Description L97.518 Non-pressure chronic ulcer of other part of right foot with other specified sev Modifier: erity Quantity: 1 Physician Procedures : CPT4 Code Description Modifier [id number] 11042 - WC PHYS SUBQ TISS 20 SQ CM ICD-10 Diagnosis Description L97.518 Non-pressure chronic ulcer of other part of right foot with other specified severity Quantity: 1 Electronic Signature(s) Signed: 09/14/2023 1:23:29 PM By: Allen Derry PA-C Entered By: Allen Derry on 09/14/2023 10:23:28  08:37:12 -------------------------------------------------------------------------------- Progress Note Details Patient Name: Date of Service: Jason [name], Jason [name] 09/14/2023 11:15 A M Medical Record Number: [id number] Patient Account Number: [id number] Date of Birth/Sex: Treating RN: July 02, 1941 (82 y.o. M) Primary Care Provider: [name] Other Clinician: Referring Provider: Treating Provider/Extender: Gaynelle Arabian in Treatment: 9 Subjective Chief Complaint Information obtained from Patient 01/31/17; patient is here for review of wound on his left great toe 07/13/2023; patient comes back to clinic for review of wounds on his right transmetatarsal amputation site History of Present Illness (HPI) Chronic/Inactive Condition: He was followed for a period of time by Dr. Durwin Nora of vein and vascular as far as I can see last saw him on 1//24. 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 Last arterial studies I see showed noncompressible ABIs bilaterally but multiphasic waveforms on the right but a TBI of 0.44 on the left 01/31/17; this is a 82 year old man who has a history of type 2 diabetes on oral agents. He has a history of wound difficulties however this is on his right foot in fact he has had amputations of his medial 3 toes. I did not actually look at the right foot today. He tells me about a month ago he was cutting his mycotic nails informed a wound on the medial aspect of the left great toe. He saw Dr. Logan Bores of podiatry. He has been using Neosporin for a while and now simply wrapping it with gauze. He states he does have peripheral neuropathy. His hemoglobin A1c in this clinic was 1. 02/21/1989  non-compressible on the left. He did have vascular studies done in 2015 showing ABIs of 1.4 bilaterally, TBI's of 1.0 on the right 1.1 on the left with triphasic waveforms. He has not had imaging studies that I'm aware of 02/07/17; this is a 82 year old man with type 2 diabetes on oral agents. [place] with a small open area on the medial aspect of his left great toe. This is in the setting of a significant mycotic nail for which he follows with Dr. Logan Bores of podiatry READMISSION 07/13/2023 This is a now 82 year old man who is had problems with a wound on his right foot amputation site after undergoing a transmetatarsal amputation in May of this year. He was admitted to hospital from 03/29/2023 through 04/02/2023 with sepsis felt to be secondary to an infected wound I think on the right first metatarsal head he underwent a transmetatarsal amputation by podiatry in the hospital. A culture of the wound had previously shown Klebsiella. He was seen in follow-up in the office on 04/19/2023 with a postop wound infection and was given doxycycline and Augmentin for 14 days. He has been dressing the wounds with Betadine ARI, ROSENDALE ([id number]) [phone].pdf Page 6 of 8 wet-to-dry. Looking through epic it would appear that the follow-up with the podiatry group has been sporadic. The patient tells me he was at [facility] skilled facility although he is back at home now. His wound now is actually probably between the fourth and fifth metatarsal head on the amputation site. He has a smaller wound on the dorsal foot in this area and I am sure at 1 time these connected but I could not prove this today. He just came in with kerlix and dry gauze on the wound. He was followed for a  Jason [name], Jason [name] ([id number]) [id number] Page 1 of 8 Visit Report for 09/14/2023 Chief Complaint Document Details Patient Name: Date of Service: TANDY, LUCKADOO 09/14/2023 11:15 A M Medical Record Number: [id number] Patient Account Number: [id number] Date of Birth/Sex: Treating RN: 10/24/1941 (82 y.o. M) Primary Care Provider: [name] Other Clinician: Referring Provider: Treating Provider/Extender: Gaynelle Arabian in Treatment: 9 Information Obtained from: Patient Chief Complaint 01/31/17; patient is here for review of wound on his left great toe 07/13/2023; patient comes back to clinic for review of wounds on his right transmetatarsal amputation site Electronic Signature(s) Signed: 09/14/2023 11:37:22 AM By: Allen Derry PA-C Entered By: Allen Derry on 09/14/2023 08:37:22 -------------------------------------------------------------------------------- Debridement Details Patient Name: Date of Service: Jason Jobs. 09/14/2023 11:15 A M Medical Record Number: [id number] Patient Account Number: [id number] Date of Birth/Sex: Treating RN: 1940-12-17 (82 y.o. Jason [name] Primary Care Provider: [name] Other Clinician: Referring Provider: Treating Provider/Extender: Gaynelle Arabian in Treatment: 9 Debridement Performed for Assessment: Wound #5 Right Amputation Site - Transmetatarsal Performed By: Physician Lenda Kelp, PA The following information was scribed by: Karie Schwalbe The information was scribed for: Lenda Kelp Debridement Type: Debridement Severity of Tissue Pre Debridement: Fat layer exposed Level of Consciousness (Pre-procedure): Awake and Alert Pre-procedure Verification/Time Out Yes - 11:55 Taken: Start Time: 11:55 Pain Control: Lidocaine 4% T opical Solution Percent of Wound Bed Debrided: 100% T Area Debrided (cm): otal 0.07 Tissue and other material debrided: Viable,  Non-Viable, Callus, Slough, Subcutaneous, Slough Level: Skin/Subcutaneous Tissue Debridement Description: Excisional Instrument: Curette Bleeding: Minimum Hemostasis Achieved: Pressure Response to Treatment: Procedure was tolerated well Level of Consciousness (Post- Awake and Alert procedure): Post Debridement Measurements of Total Wound Length: (cm) 0.3 Width: (cm) 0.3 Depth: (cm) 0.5 Volume: (cm) 0.035 Character of Wound/Ulcer Post Debridement: Improved Jason [name], Jason [name] ([id number]) [id number] Page 2 of 8 Severity of Tissue Post Debridement: Fat layer exposed Post Procedure Diagnosis Same as Pre-procedure Electronic Signature(s) Signed: 09/14/2023 12:54:46 PM By: Karie Schwalbe RN Signed: 09/14/2023 4:33:40 PM By: Allen Derry PA-C Entered By: Karie Schwalbe on 09/14/2023 08:59:59 -------------------------------------------------------------------------------- HPI Details Patient Name: Date of Service: Jason Milian S. 09/14/2023 11:15 A M Medical Record Number: [id number] Patient Account Number: [id number] Date of Birth/Sex: Treating RN: 11/25/1940 (82 y.o. M) Primary Care Provider: [name] Other Clinician: Referring Provider: Treating Provider/Extender: Gaynelle Arabian in Treatment: 9 History of Present Illness Chronic/Inactive Conditions Condition 1: He was followed for a period of time by Dr. Durwin Nora of vein and vascular as far as I can see last saw him on 1//24. 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 Last arterial studies I see showed noncompressible ABIs bilaterally but multiphasic waveforms on the right but a TBI of 0.44 on the left HPI Description: 01/31/17; this is a 82 year old man who has a history of type 2 diabetes on oral agents. He has a history of wound difficulties however this is on  his right foot in fact he has had amputations of his medial 3 toes. I did not actually look at the right foot today. He tells me about a month ago he was cutting his mycotic nails informed a wound on the medial aspect of the left great toe. He saw Dr. Logan Bores of podiatry. He has been using Neosporin for  point he had multiphasic waveforms on the right with a TBI of 0.44 on the left. I do not see any  recent x-rays currently although he has seen podiatry I cannot see any of those x-rays. I really think having him go for a x-ray of the foot would be a good idea. Jason [name], Jason [name] ([id number]) [id number] Page 3 of 8 07-27-2023 upon evaluation today patient appears to be doing well currently in regard to his wound. He is actually showing signs of good improvement. I actually feel like this is significantly improved compared to last week's evaluation he does seem to have good granulation and epithelization and very pleased there is some callus I am going to need to remove however. 08-10-2023 upon evaluation today patient appears to be doing well currently in regard to his wound. The foot actually appears to be showing signs of excellent improvement and I am very pleased in that regard. Fortunately I do not see any evidence of worsening overall and I do believe that the patient is making good headway towards closure. For now I am probably going to recommend that we continue with the [facility] although we may make a switch to collagen in the near future depending on how things progress. 08-24-2023 upon evaluation today patient appears to be doing well currently in regard to his wound. He has been tolerating the dressing changes I do believe it may be good to switch over to endoform at this point he is in agreement with that plan. Fortunately I do not see any evidence of infection or worsening overall and I am pleased in that regard. 08-31-2023 upon evaluation today patient's wound bed actually showed signs of good granulation epithelization at this point. Fortunately I do not see any signs of infection he seems to be doing quite well based on what I am seeing today. 09-07-2023 upon evaluation today patient appears to be doing well currently in regard to his wound. Has been tolerating the dressing changes without complication. Fortunately I do not see any signs of active  infection locally or systemically at this time which is great news. No fevers, chills, nausea, vomiting, or diarrhea. 09-14-2023 upon evaluation today patient appears to be doing excellent in regard to his foot ulcer. This is getting very close to complete resolution. Fortunately I do not see any evidence of worsening overall and I do believe that the patient is making excellent headway towards complete closure. Electronic Signature(s) Signed: 09/14/2023 1:22:41 PM By: Allen Derry PA-C Entered By: Allen Derry on 09/14/2023 10:22:41 -------------------------------------------------------------------------------- Physical Exam Details Patient Name: Date of Service: Jason [name], Jason [name] 09/14/2023 11:15 A M Medical Record Number: [id number] Patient Account Number: [id number] Date of Birth/Sex: Treating RN: 02-18-1941 (82 y.o. M) Primary Care Provider: [name] Other Clinician: Referring Provider: Treating Provider/Extender: Gaynelle Arabian in Treatment: 9 Constitutional Well-nourished and well-hydrated in no acute distress. Respiratory normal breathing without difficulty. Psychiatric this patient is able to make decisions and demonstrates good insight into disease process. Alert and Oriented x 3. pleasant and cooperative. Notes Patient's wound bed did require some minimal sharp debridement clearway some of the current necrotic debris he tolerated that today without complication postdebridement wound bed appears to be doing much better. Electronic Signature(s) Signed: 09/14/2023 1:22:58 PM By: Allen Derry PA-C Entered By: Allen Derry on 09/14/2023 10:22:58 -------------------------------------------------------------------------------- Physician Orders Details Patient Name: Date of Service: Jason Jobs. 09/14/2023 11:15 A M Medical Record Number: [id number] Patient Account Number:  desk Please make appointments at front desk Anesthetic: (In clinic) Topical Lidocaine 4% applied to wound bed - Used in Clinic Bathing/ Shower/ Hygiene: May shower and wash wound with soap and water. 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 May utilize formulary equivalent dressing for wound treatment orders unless otherwise specified. - use Endoform as primary dressing place into the wound bed.Then place gauze, Kerlix tape over wound dressing. Other Home Health Orders/Instructions: - Wound Care by [facility] at least twice a week. WOUND #5: - Amputation Site - Transmetatarsal Wound Laterality: Right Cleanser: Soap and Water 3 x Per Week/30 Days Discharge Instructions: May shower and wash wound with dial antibacterial soap and water prior to dressing change. Cleanser: Vashe 5.8 (oz) 3 x Per Week/30 Days Discharge Instructions: Cleanse the wound with Vashe prior to applying a clean dressing using gauze sponges, not tissue or cotton balls. Cleanser: Wound Cleanser 3 x Per Week/30 Days Discharge Instructions: Cleanse the wound with wound cleanser prior to applying a clean dressing using gauze sponges, not tissue or cotton balls. Prim Dressing: Endoform 2x2 in 3 x Per Week/30 Days ary Discharge Instructions: Moisten with saline Secondary Dressing: Woven Gauze Sponge, Non-Sterile 4x4 in 3 x Per Week/30 Days Discharge Instructions: Apply over primary dressing as directed. Secured With: [employer], 4.5x3.1 (in/yd) 3 x Per Week/30 Days Discharge Instructions: Secure with Kerlix as directed. 1. I am going to recommend that we have the patient going to continue to monitor for any signs of infection or worsening. Based on what I see I do believe that we will make an excellent headway towards closure. 2. Recommend continue with endoform followed by ABD pad and roll gauze to secure in place. We will see patient back for reevaluation in 1 week here in the clinic. If anything worsens or changes patient will contact our office for additional recommendations. Electronic Signature(s) Signed: 09/14/2023 1:23:23 PM By: Allen Derry PA-C Entered By: Allen Derry on 09/14/2023 10:23:22 Jason [name] ([id number])  [id number] Page 8 of 8 -------------------------------------------------------------------------------- SuperBill Details Patient Name: Date of Service: Jason [name], Jason [name] 09/14/2023 Medical Record Number: [id number] Patient Account Number: [id number] Date of Birth/Sex: Treating RN: 1940-11-26 (82 y.o. 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

## 2023-09-16 NOTE — Progress Notes (Signed)
LAZARIUS, RIVKIN ([id number]) [id number] Page 1 of 6 Visit Report for 09/14/2023 Arrival Information Details Patient Name: Date of Service: Jason [name], Jason [name] 09/14/2023 11:15 A M Medical Record Number: [id number] Patient Account Number: [id number] Date of Birth/Sex: Treating RN: 06-23-1941 (82 y.o. [name] Primary Care Savaughn Karwowski: [name] Other Clinician: Referring Tsion Inghram: Treating Adon Gehlhausen/Extender: Gaynelle Arabian in Treatment: 9 Visit Information History Since Last Visit Added or deleted any medications: No Patient Arrived: Wheel Chair Any new allergies or adverse reactions: No Arrival Time: 11:40 Had a fall or experienced change in No Accompanied By: wife activities of daily living that may affect Transfer Assistance: None risk of falls: Patient Identification Verified: Yes Signs or symptoms of abuse/neglect since last visito No Secondary Verification Process Completed: Yes Hospitalized since last visit: No Patient Has Alerts: Yes Implantable device outside of the clinic excluding No Patient Alerts: ABI R 1.46 (03/29/23) cellular tissue based products placed in the center ABI L 1.63 (03/29/23) since last visit: Has Dressing in Place as Prescribed: Yes Pain Present Now: No Electronic Signature(s) Signed: 09/14/2023 5:03:07 PM By: Redmond Pulling RN, BSN Entered By: Redmond Pulling on 09/14/2023 08:40:56 -------------------------------------------------------------------------------- Encounter Discharge Information Details Patient Name: Date of Service: Jason Milian S. 09/14/2023 11:15 A M Medical Record Number: [id number] Patient Account Number: [id number] Date of Birth/Sex: Treating RN: July 09, 1941 (82 y.o. [name] Primary Care Xavier Fournier: [name] Other Clinician: Referring Ayaan Shutes: Treating Theola Cuellar/Extender: Gaynelle Arabian in Treatment: 9 Encounter Discharge Information Items Post  Procedure Vitals Discharge Condition: Stable Temperature (F): 98.6 Ambulatory Status: Wheelchair Pulse (bpm): 86 Discharge Destination: Home Respiratory Rate (breaths/min): 18 Transportation: Private Auto Blood Pressure (mmHg): 100/63 Accompanied By: wife Schedule Follow-up Appointment: Yes Clinical Summary of Care: Patient Declined Electronic Signature(s) Signed: 09/14/2023 12:54:46 PM By: Karie Schwalbe RN Entered By: Karie Schwalbe on 09/14/2023 09:53:06 Fox, Winifred Olive ([id number]) [id number] Page 2 of 6 -------------------------------------------------------------------------------- Lower Extremity Assessment Details Patient Name: Date of Service: Jason [name] 09/14/2023 11:15 A M Medical Record Number: [id number] Patient Account Number: [id number] Date of Birth/Sex: Treating RN: [birth year] (82 y.o. [name] Primary Care Lorenia Hoston: [name] Other Clinician: Referring Petar Mucci: Treating Catheline Hixon/Extender: Bess Kinds Weeks in Treatment: 9 Edema Assessment Assessed: [Left: No] [Right: No] Edema: [Left: N] [Right: o] Calf Left: Right: Point of Measurement: 38 cm From Medial Instep 41.2 cm Ankle Left: Right: Point of Measurement: 11 cm From Medial Instep 27.2 cm Vascular Assessment Pulses: Dorsalis Pedis Palpable: [Right:Yes] Extremity colors, hair growth, and conditions: Extremity Color: [Right:Pale] Hair Growth on Extremity: [Right:No] Temperature of Extremity: [Right:Warm] Dependent Rubor: [Right:No No] Electronic Signature(s) Signed: 09/14/2023 5:03:07 PM By: Redmond Pulling RN, BSN Entered By: Redmond Pulling on 09/14/2023 08:40:10 -------------------------------------------------------------------------------- Multi-Disciplinary Care Plan Details Patient Name: Date of Service: Jason [name]. 09/14/2023 11:15 A M Medical Record Number: [id number] Patient Account Number: [id number] Date of  Birth/Sex: Treating RN: [birth year] (82 y.o. [name] Primary Care Alura Olveda: [name] Other Clinician: Referring Tanis Burnley: Treating Nicky Kras/Extender: Gaynelle Arabian in Treatment: 9 Active Inactive Wound/Skin Impairment Nursing Diagnoses: Impaired tissue integrity Goals: Patient/caregiver will verbalize understanding of skin care regimen Date Initiated: 07/13/2023 Target Resolution Date: 11/14/2023 Goal Status: Active JYQUAN, KENLEY ([id number]) [id number] Page 3 of 6 Interventions: Assess ulceration(s) every visit Treatment Activities: Skin care regimen initiated : 07/13/2023 Notes: Electronic Signature(s) Signed: 09/14/2023 12:54:46 PM By: Karie Schwalbe RN Entered By:  Karie Schwalbe on 09/14/2023 09:53:46 -------------------------------------------------------------------------------- Pain Assessment Details Patient Name: Date of Service: [name], Jason 09/14/2023 11:15 A M Medical Record Number: [id number] Patient Account Number: [id number] Date of Birth/Sex: Treating RN: 1941-08-22 (82 y.o. [name] Primary Care Sybrina Laning: [name] Other Clinician: Referring Auron Tadros: Treating Buryl Bamber/Extender: Gaynelle Arabian in Treatment: 9 Active Problems Location of Pain Severity and Description of Pain Patient Has Paino No Site Locations Pain Management and Medication Current Pain Management: Electronic Signature(s) Signed: 09/14/2023 5:03:07 PM By: Redmond Pulling RN, BSN Entered By: Redmond Pulling on 09/14/2023 08:41:33 -------------------------------------------------------------------------------- Patient/Caregiver Education Details Patient Name: Date of Service: Jason [name] 10/30/2024andnbsp11:15 A M Medical Record Number: [id number] Patient Account Number: [id number] Date of Birth/Gender: Treating RN: [birth year] (82 y.o. [name] Primary Care Physician: [name]  Other Clinician: PANAYIOTIS, RAINVILLE ([id number]) [id number] Page 4 of 6 Referring Physician: Treating Physician/Extender: Gaynelle Arabian in Treatment: 9 Education Assessment Education Provided To: Patient Education Topics Provided Wound/Skin Impairment: Methods: Explain/Verbal Responses: State content correctly [employer]) Signed: 09/14/2023 12:54:46 PM By: Karie Schwalbe RN Entered By: Karie Schwalbe on 09/14/2023 09:54:00 -------------------------------------------------------------------------------- Wound Assessment Details Patient Name: Date of Service: Jason [name]. 09/14/2023 11:15 A M Medical Record Number: [id number] Patient Account Number: [id number] Date of Birth/Sex: Treating RN: 09-11-1941 (82 y.o. Tammy Sours Primary Care Ilisha Blust: [name] Other Clinician: Referring Benzion Mesta: Treating Maxximus Gotay/Extender: Gaynelle Arabian in Treatment: 9 Wound Status Wound Number: 5 Primary Diabetic Wound/Ulcer of the Lower Extremity Etiology: Wound Location: Right Amputation Site - Transmetatarsal Wound Open Wounding Event: Gradually Appeared Status: Date Acquired: 04/19/2023 Comorbid Sleep Apnea, Arrhythmia, Congestive Heart Failure, Peripheral Weeks Of Treatment: 9 History: Arterial Disease, Peripheral Venous Disease, Type II Diabetes, Clustered Wound: No Neuropathy Photos Wound Measurements Length: (cm) 0.3 Width: (cm) 0.3 Depth: (cm) 0.5 Area: (cm) 0.071 Volume: (cm) 0.035 % Reduction in Area: 97.9% % Reduction in Volume: 97.4% Epithelialization: Small (1-33%) Tunneling: Yes Position (o'clock): 4 Maximum Distance: (cm) 0.5 Wound Description Classification: Grade 2 Wound Margin: Distinct, outline attached Exudate Amount: Medium Exudate Type: Serosanguineous Exudate Color: red, brown BARAK, BIALECKI ([id number]) Foul Odor After Cleansing: No Slough/Fibrino  Yes [phone].pdf Page 5 of 6 Wound Bed Granulation Amount: Large (67-100%) Exposed Structure Granulation Quality: Pink Fascia Exposed: No Necrotic Amount: Small (1-33%) Fat Layer (Subcutaneous Tissue) Exposed: Yes Necrotic Quality: Adherent Slough Tendon Exposed: No Muscle Exposed: No Joint Exposed: No Bone Exposed: No Periwound Skin Texture Texture Color No Abnormalities Noted: No No Abnormalities Noted: Yes Callus: Yes Temperature / Pain Scarring: Yes Temperature: No Abnormality Moisture No Abnormalities Noted: No Dry / Scaly: Yes Treatment Notes Wound #5 (Amputation Site - Transmetatarsal) Wound Laterality: Right Cleanser Soap and Water Discharge Instruction: May shower and wash wound with dial antibacterial soap and water prior to dressing change. Vashe 5.8 (oz) Discharge Instruction: Cleanse the wound with Vashe prior to applying a clean dressing using gauze sponges, not tissue or cotton balls. Wound Cleanser Discharge Instruction: Cleanse the wound with wound cleanser prior to applying a clean dressing using gauze sponges, not tissue or cotton balls. Peri-Wound Care Topical Primary Dressing Endoform 2x2 in Discharge Instruction: Moisten with saline Secondary Dressing Woven Gauze Sponge, Non-Sterile 4x4 in Discharge Instruction: Apply over primary dressing as directed. Secured With [employer], 4.5x3.1 (in/yd) Discharge Instruction: Secure with Kerlix as directed. Compression Wrap Compression Stockings Add-Ons Electronic Signature(s) Signed: 09/15/2023 6:03:32 PM By: Elesa Hacker,  Yvonne Kendall RN, BSN Entered By: Shawn Stall on 09/14/2023 08:42:15 -------------------------------------------------------------------------------- Vitals Details Patient Name: Date of Service: UNDRA, HARRIMAN 09/14/2023 11:15 A M Medical Record Number: [id number] Patient Account Number: [id number] Date of Birth/Sex: Treating RN: [birth year] (82 y.o. [name] Primary Care Destynee Stringfellow: [name] Other Clinician: Referring Atsushi Yom: Treating Salma Walrond/Extender: Daine Gip, Criss Rosales in Treatment: [address] Progress S ([id number]) [id number] Page 6 of 6 Vital Signs Time Taken: 11:41 Temperature (F): 98.2 Height (in): 80 Pulse (bpm): 86 Weight (lbs): 280 Respiratory Rate (breaths/min): 18 Body Mass Index (BMI): 30.8 Blood Pressure (mmHg): 100/63 Capillary Blood Glucose (mg/dl): 74 Reference Range: 80 - 120 mg / dl Electronic Signature(s) Signed: 09/14/2023 5:03:07 PM By: Redmond Pulling RN, BSN Entered By: Redmond Pulling on 09/14/2023 08:41:27

## 2023-09-21 ENCOUNTER — Encounter (HOSPITAL_BASED_OUTPATIENT_CLINIC_OR_DEPARTMENT_OTHER): Payer: Medicare HMO | Attending: Physician Assistant | Admitting: Internal Medicine

## 2023-09-21 DIAGNOSIS — E11621 Type 2 diabetes mellitus with foot ulcer: Secondary | ICD-10-CM | POA: Diagnosis present

## 2023-09-21 DIAGNOSIS — L97518 Non-pressure chronic ulcer of other part of right foot with other specified severity: Secondary | ICD-10-CM | POA: Diagnosis not present

## 2023-09-21 DIAGNOSIS — G4730 Sleep apnea, unspecified: Secondary | ICD-10-CM | POA: Insufficient documentation

## 2023-09-21 DIAGNOSIS — E1151 Type 2 diabetes mellitus with diabetic peripheral angiopathy without gangrene: Secondary | ICD-10-CM | POA: Insufficient documentation

## 2023-09-21 DIAGNOSIS — I4891 Unspecified atrial fibrillation: Secondary | ICD-10-CM | POA: Insufficient documentation

## 2023-09-21 DIAGNOSIS — I5032 Chronic diastolic (congestive) heart failure: Secondary | ICD-10-CM | POA: Diagnosis not present

## 2023-09-21 DIAGNOSIS — L84 Corns and callosities: Secondary | ICD-10-CM | POA: Insufficient documentation

## 2023-09-21 DIAGNOSIS — E1142 Type 2 diabetes mellitus with diabetic polyneuropathy: Secondary | ICD-10-CM | POA: Insufficient documentation

## 2023-09-22 NOTE — Progress Notes (Signed)
Jason [name] ([id number]) [id number] Page 1 of 7 Visit Report for 09/21/2023 HPI Details Patient Name: Date of Service: Jason [name], Jason [name] 09/21/2023 12:30 PM Medical Record Number: [id number] Patient Account Number: [id number] Date of Birth/Sex: Treating RN: June 02, 1941 (82 y.o. M) Primary Care Provider: [name] Other Clinician: Referring Provider: Treating Provider/Extender: Erasmo Downer in Treatment: 10 History of Present Illness Chronic/Inactive Conditions Condition 1: He was followed for a period of time by Dr. Durwin Nora of vein and vascular as far as I can see last saw him on 1//24. 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 Last arterial studies I see showed noncompressible ABIs bilaterally but multiphasic waveforms on the right but a TBI of 0.44 on the left HPI Description: 01/31/17; this is a 82 year old man who has a history of type 2 diabetes on oral agents. He has a history of wound difficulties however this is on his right foot in fact he has had amputations of his medial 3 toes. I did not actually look at the right foot today. He tells me about a month ago he was cutting his mycotic nails informed a wound on the medial aspect of the left great toe. He saw Dr. Logan Bores of podiatry. He has been using Neosporin for a while and now simply wrapping it with gauze. He states he does have peripheral neuropathy. His hemoglobin A1c in this clinic was 1. 02/21/1989 non-compressible on the left. He did have vascular studies done in 2015 showing ABIs of 1.4 bilaterally, TBI's of 1.0 on the right 1.1 on the left with triphasic waveforms. He has not had imaging studies that I'm aware of 02/07/17; this is a 82 year old man with type 2 diabetes on oral agents. [place] with a small open area on the medial  aspect of his left great toe. This is in the setting of a significant mycotic nail for which he follows with Dr. Logan Bores of podiatry READMISSION 07/13/2023 This is a now 82 year old man who is had problems with a wound on his right foot amputation site after undergoing a transmetatarsal amputation in May of this year. He was admitted to hospital from 03/29/2023 through 04/02/2023 with sepsis felt to be secondary to an infected wound I think on the right first metatarsal head he underwent a transmetatarsal amputation by podiatry in the hospital. A culture of the wound had previously shown Klebsiella. He was seen in follow-up in the office on 04/19/2023 with a postop wound infection and was given doxycycline and Augmentin for 14 days. He has been dressing the wounds with Betadine wet-to-dry. Looking through epic it would appear that the follow-up with the podiatry group has been sporadic. The patient tells me he was at [facility] skilled facility although he is back at home now. His wound now is actually probably between the fourth and fifth metatarsal head on the amputation site. He has a smaller wound on the dorsal foot in this area and I am sure at 1 time these connected but I could not prove this today. He just came in with kerlix and dry gauze on the wound. He was followed for a period of time by Dr. Durwin Nora of vein and vascular as far as I can see last saw him on 1//24. At that time he was felt to have mild infrainguinal arterial occlusive disease  but had multiphasic signals in both feet which should have been sufficient to heal wounds. Last arterial studies I see showed noncompressible ABIs bilaterally but multiphasic waveforms on the right but a TBI of 0.44 on the left Past medical history includes type 2 diabetes with peripheral neuropathy and PAD, diastolic heart failure, mitral valve stenosis, atrial fibrillation on Xarelto, sleep apnea ABIs were not repeated today in the clinic as they are  previously noncompressible as noted His MRI is listed below; This did not suggest osteomyelitis in the area of the right foot where his wound is currently located MRI 03/29/23 IMPRESSION: 1. Deep skin wound about the plantar aspect of the first metatarsal head with mild marrow edema about the plantar aspect of the first metatarsal head concerning for osteomyelitis. 2. No fluid collection or abscess. 3. Postsurgical changes with prior amputation of the right foot through the metatarsophalangeal joints of the first and fifth digit and through metatarsal heads of the second through fourth digits. 4. Edema of the plantar muscles and tendons suggesting diabetic myopathy/myositis. 07-20-2023 upon evaluation today patient presents for follow-up evaluation here in the clinic. I did review his note from the last visit with Dr. Leanord Hawking. With that being said he is a current patient of vein and vascular here in [place] and has seen them last it looks like according to Dr. Jannetta Quint note in January of this year. At that point he had multiphasic waveforms on the right with a TBI of 0.44 on the left. I do not see any recent x-rays currently although he has seen podiatry I cannot see any of those x-rays. I really think having him go for a x-ray of the foot would be a good idea. 07-27-2023 upon evaluation today patient appears to be doing well currently in regard to his wound. He is actually showing signs of good improvement. I actually feel like this is significantly improved compared to last week's evaluation he does seem to have good granulation and epithelization and very pleased there is some callus I am going to need to remove however. 08-10-2023 upon evaluation today patient appears to be doing well currently in regard to his wound. The foot actually appears to be showing signs of excellent improvement and I am very pleased in that regard. Fortunately I do not see any evidence of worsening overall and I do  believe that the patient is making good headway towards closure. For now I am probably going to recommend that we continue with the [facility] although we may make a switch to collagen in the near future depending on how things progress. 08-24-2023 upon evaluation today patient appears to be doing well currently in regard to his wound. He has been tolerating the dressing changes I do believe it TEVITA, GOMER ([id number]) [id number] Page 2 of 7 may be good to switch over to endoform at this point he is in agreement with that plan. Fortunately I do not see any evidence of infection or worsening overall and I am pleased in that regard. 08-31-2023 upon evaluation today patient's wound bed actually showed signs of good granulation epithelization at this point. Fortunately I do not see any signs of infection he seems to be doing quite well based on what I am seeing today. 09-07-2023 upon evaluation today patient appears to be doing well currently in regard to his wound. Has been tolerating the dressing changes without complication. Fortunately I do not see any signs of active infection locally or systemically at this time which  is great news. No fevers, chills, nausea, vomiting, or diarrhea. 09-14-2023 upon evaluation today patient appears to be doing excellent in regard to his foot ulcer. This is getting very close to complete resolution. Fortunately I do not see any evidence of worsening overall and I do believe that the patient is making excellent headway towards complete closure. 11/6; this is a wound on the right TMA tip at the level roughly of the fourth metatarsal. Small wound with 0.5 cm of depth we have been using endoform ABDs and gauze. Electronic Signature(s) Signed: 09/21/2023 4:54:50 PM By: Baltazar Najjar MD Entered By: Baltazar Najjar on 09/21/2023 10:30:57 -------------------------------------------------------------------------------- Physical Exam  Details Patient Name: Date of Service: [name] 09/21/2023 12:30 PM Medical Record Number: [id number] Patient Account Number: [id number] Date of Birth/Sex: Treating RN: [birth year] (82 y.o. M) Primary Care Provider: [name] Other Clinician: Referring Provider: Treating Provider/Extender: Erasmo Downer in Treatment: 10 Constitutional Sitting or standing Blood Pressure is within target range for patient.. Pulse regular and within target range for patient.Marland Kitchen Respirations regular, non-labored and within target range.. Temperature is normal and within the target range for the patient.Marland Kitchen Appears in no distress. Notes Wound exam; small punched-out area at the tip of his right TMA. The etiology here is not really clear to me. This is only 0.5 cm in depth there is no undermining does not probe anywhere near bone. There is no purulent drainage. Electronic Signature(s) Signed: 09/21/2023 4:54:50 PM By: Baltazar Najjar MD Entered By: Baltazar Najjar on 09/21/2023 10:33:59 -------------------------------------------------------------------------------- Physician Orders Details Patient Name: Date of Service: [name] 09/21/2023 12:30 PM Medical Record Number: [id number] Patient Account Number: [id number] Date of Birth/Sex: Treating RN: [birth year] (82 y.o. Tammy Sours Primary Care Provider: [name] Other Clinician: Referring Provider: Treating Provider/Extender: Erasmo Downer in Treatment: 10 The following information was scribed by: Shawn Stall The information was scribed for: Baltazar Najjar Verbal / Phone Orders: No Diagnosis Coding ICD-10 Coding Code Description E11.621 Type 2 diabetes mellitus with foot ulcer L97.518 Non-pressure chronic ulcer of other part of right foot with other specified severity KIAN, OTTAVIANO ([id number]) [id number] Page 3 of 7 E11.51 Type 2 diabetes mellitus with diabetic  peripheral angiopathy without gangrene E11.42 Type 2 diabetes mellitus with diabetic polyneuropathy Follow-up Appointments ppointment in 1 week. - Dr. Emeline Darling (Room 9) 09/28/23 at 1115am Return A ppointment in 2 weeks. - Dr. Leanord Hawking (Room 9) 10/05/2023 1115 Return A Anesthetic (In clinic) Topical Lidocaine 4% applied to wound bed - Used in Clinic Bathing/ Shower/ Hygiene May shower and wash wound with soap and water. - After shower, may change wound dressing if so desired Home Health Wound #5 Right Amputation Site - Transmetatarsal No change in wound care orders this week; continue Home Health for wound care. May utilize formulary equivalent dressing for wound treatment orders unless otherwise specified. - use Endoform as primary dressing place into the wound bed.Then place gauze, Kerlix tape over wound dressing. Other Home Health Orders/Instructions: - Wound Care by [facility] at least twice a week. Wound Treatment Wound #5 - Amputation Site - Transmetatarsal Wound Laterality: Right Cleanser: Soap and Water 3 x Per Week/30 Days Discharge Instructions: May shower and wash wound with dial antibacterial soap and water prior to dressing change. Cleanser: Vashe 5.8 (oz) 3 x Per Week/30 Days Discharge Instructions: Cleanse the wound with Vashe prior to applying a clean dressing using gauze sponges, not tissue or cotton balls.  Cleanser: Wound Cleanser 3 x Per Week/30 Days Discharge Instructions: Cleanse the wound with wound cleanser prior to applying a clean dressing using gauze sponges, not tissue or cotton balls. Prim Dressing: Endoform 2x2 in 3 x Per Week/30 Days ary Discharge Instructions: Moisten with saline Secondary Dressing: Woven Gauze Sponge, Non-Sterile 4x4 in 3 x Per Week/30 Days Discharge Instructions: Apply over primary dressing as directed. Secured With: [employer], 4.5x3.1 (in/yd) 3 x Per Week/30 Days Discharge Instructions: Secure with Kerlix as  directed. Electronic Signature(s) Signed: 09/21/2023 4:54:50 PM By: Baltazar Najjar MD Signed: 09/21/2023 6:03:26 PM By: Shawn Stall RN, BSN Entered By: Shawn Stall on 09/21/2023 10:11:19 -------------------------------------------------------------------------------- Problem List Details Patient Name: Date of Service: [name] 09/21/2023 12:30 PM Medical Record Number: [id number] Patient Account Number: [id number] Date of Birth/Sex: Treating RN: [birth year] (82 y.o. Tammy Sours Primary Care Provider: [name] Other Clinician: Referring Provider: Treating Provider/Extender: Erasmo Downer in Treatment: 10 Active Problems ICD-10 Encounter Code Description Active Date MDM Diagnosis E11.621 Type 2 diabetes mellitus with foot ulcer 07/13/2023 No Yes L97.518 Non-pressure chronic ulcer of other part of right foot with other specified 07/13/2023 No Yes severity JOVANNY, STEPHANIE ([id number]) [phone].pdf Page 4 of 7 E11.51 Type 2 diabetes mellitus with diabetic peripheral angiopathy without gangrene 07/13/2023 No Yes E11.42 Type 2 diabetes mellitus with diabetic polyneuropathy 07/13/2023 No Yes Inactive Problems Resolved Problems Electronic Signature(s) Signed: 09/21/2023 4:54:50 PM By: Baltazar Najjar MD Entered By: Baltazar Najjar on 09/21/2023 10:29:51 -------------------------------------------------------------------------------- Progress Note Details Patient Name: Date of Service: [name] 09/21/2023 12:30 PM Medical Record Number: [id number] Patient Account Number: [id number] Date of Birth/Sex: Treating RN: Apr 10, 1941 (82 y.o. M) Primary Care Provider: [name] Other Clinician: Referring Provider: Treating Provider/Extender: Erasmo Downer in Treatment: 10 Subjective History of Present Illness (HPI) Chronic/Inactive Condition: He was followed for a period of time by Dr. Durwin Nora of vein and  vascular as far as I can see last saw him on 1//24. 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 Last arterial studies I see showed noncompressible ABIs bilaterally but multiphasic waveforms on the right but a TBI of 0.44 on the left 01/31/17; this is a 82 year old man who has a history of type 2 diabetes on oral agents. He has a history of wound difficulties however this is on his right foot in fact he has had amputations of his medial 3 toes. I did not actually look at the right foot today. He tells me about a month ago he was cutting his mycotic nails informed a wound on the medial aspect of the left great toe. He saw Dr. Logan Bores of podiatry. He has been using Neosporin for a while and now simply wrapping it with gauze. He states he does have peripheral neuropathy. His hemoglobin A1c in this clinic was 1. 02/21/1989 non-compressible on the left. He did have vascular studies done in 2015 showing ABIs of 1.4 bilaterally, TBI's of 1.0 on the right 1.1 on the left with triphasic waveforms. He has not had imaging studies that I'm aware of 02/07/17; this is a 82 year old man with type 2 diabetes on oral agents. [place] with a small open area on the medial aspect of his left great toe. This is in the setting of a significant mycotic nail for  which he follows with Dr. Logan Bores of podiatry READMISSION 07/13/2023 This is a now 82 year old man who is had problems with a wound on his right foot amputation site after undergoing a transmetatarsal amputation in May of this year. He was admitted to hospital from 03/29/2023 through 04/02/2023 with sepsis felt to be secondary to an infected wound I think on the right first metatarsal head he underwent a transmetatarsal amputation by podiatry in the hospital. A culture of the wound had previously shown Klebsiella. He was seen in  follow-up in the office on 04/19/2023 with a postop wound infection and was given doxycycline and Augmentin for 14 days. He has been dressing the wounds with Betadine wet-to-dry. Looking through epic it would appear that the follow-up with the podiatry group has been sporadic. The patient tells me he was at [facility] skilled facility although he is back at home now. His wound now is actually probably between the fourth and fifth metatarsal head on the amputation site. He has a smaller wound on the dorsal foot in this area and I am sure at 1 time these connected but I could not prove this today. He just came in with kerlix and dry gauze on the wound. He was followed for a period of time by Dr. Durwin Nora of vein and vascular as far as I can see last saw him on 1//24. 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 Last arterial studies I see showed noncompressible ABIs bilaterally but multiphasic waveforms on the right but a TBI of 0.44 on the left Past medical history includes type 2 diabetes with peripheral neuropathy and PAD, diastolic heart failure, mitral valve stenosis, atrial fibrillation on Xarelto, sleep apnea ABIs were not repeated today in the clinic as they are previously noncompressible as noted His MRI is listed below; This did not suggest osteomyelitis in the area of the right foot where his wound is currently located MRI 03/29/23 IMPRESSION: 1. Deep skin wound about the plantar aspect of the first metatarsal head with mild marrow edema about the plantar aspect of the first DAKING, WESTERVELT ([id number]) [id number].pdf Page 5 of 7 metatarsal head concerning for osteomyelitis. 2. No fluid collection or abscess. 3. Postsurgical changes with prior amputation of the right foot through the metatarsophalangeal joints of the first and fifth  digit and through metatarsal heads of the second through fourth digits. 4. Edema of the plantar muscles and tendons suggesting diabetic myopathy/myositis. 07-20-2023 upon evaluation today patient presents for follow-up evaluation here in the clinic. I did review his note from the last visit with Dr. Leanord Hawking. With that being said he is a current patient of vein and vascular here in [place] and has seen them last it looks like according to Dr. Jannetta Quint note in January of this year. At that point he had multiphasic waveforms on the right with a TBI of 0.44 on the left. I do not see any recent x-rays currently although he has seen podiatry I cannot see any of those x-rays. I really think having him go for a x-ray of the foot would be a good idea. 07-27-2023 upon evaluation today patient appears to be doing well currently in regard to his wound. He is actually showing signs of good improvement. I actually feel like this is significantly improved compared to last week's evaluation he does seem to have good granulation and epithelization and very  pleased there is some callus I am going to need to remove however. 08-10-2023 upon evaluation today patient appears to be doing well currently in regard to his wound. The foot actually appears to be showing signs of excellent improvement and I am very pleased in that regard. Fortunately I do not see any evidence of worsening overall and I do believe that the patient is making good headway towards closure. For now I am probably going to recommend that we continue with the [facility] although we may make a switch to collagen in the near future depending on how things progress. 08-24-2023 upon evaluation today patient appears to be doing well currently in regard to his wound. He has been tolerating the dressing changes I do believe it may be good to switch over to endoform at this point he is in agreement with that plan. Fortunately I do not see any evidence of  infection or worsening overall and I am pleased in that regard. 08-31-2023 upon evaluation today patient's wound bed actually showed signs of good granulation epithelization at this point. Fortunately I do not see any signs of infection he seems to be doing quite well based on what I am seeing today. 09-07-2023 upon evaluation today patient appears to be doing well currently in regard to his wound. Has been tolerating the dressing changes without complication. Fortunately I do not see any signs of active infection locally or systemically at this time which is great news. No fevers, chills, nausea, vomiting, or diarrhea. 09-14-2023 upon evaluation today patient appears to be doing excellent in regard to his foot ulcer. This is getting very close to complete resolution. Fortunately I do not see any evidence of worsening overall and I do believe that the patient is making excellent headway towards complete closure. 11/6; this is a wound on the right TMA tip at the level roughly of the fourth metatarsal. Small wound with 0.5 cm of depth we have been using endoform ABDs and gauze. Objective Constitutional Sitting or standing Blood Pressure is within target range for patient.. Pulse regular and within target range for patient.Marland Kitchen Respirations regular, non-labored and within target range.. Temperature is normal and within the target range for the patient.Marland Kitchen Appears in no distress. Vitals Time Taken: 12:46 PM, Height: 80 in, Weight: 280 lbs, BMI: 30.8, Temperature: 98 F, Pulse: 80 bpm, Respiratory Rate: 18 breaths/min, Blood Pressure: 113/71 mmHg. General Notes: Wound exam; small punched-out area at the tip of his right TMA. The etiology here is not really clear to me. This is only 0.5 cm in depth there is no undermining does not probe anywhere near bone. There is no purulent drainage. Integumentary (Hair, Skin) Wound #5 status is Open. Original cause of wound was Gradually Appeared. The date acquired was:  04/19/2023. The wound has been in treatment 10 weeks. 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 The wound measures 0.3cm length x 0.2cm width x 0.5cm depth; 0.047cm^2 area and 0.024cm^3 volume. There is Fat Layer (Subcutaneous Tissue) exposed. Tunneling has been noted at 4:00 with a maximum distance of 0.4cm. Undermining begins at 12:00 and ends at 6:00 with a maximum distance of 0.3cm. There is a medium amount of serosanguineous drainage noted. The wound margin is distinct with the outline attached to the wound base. There is large (67-100%) pink granulation within the wound bed. There is a small (1-33%) amount of necrotic tissue within the wound bed including Adherent Slough. The periwound skin appearance had no abnormalities noted  for color. The periwound skin appearance exhibited: Callus, Scarring, Dry/Scaly. Periwound temperature was noted as No Abnormality. Assessment Active Problems ICD-10 Type 2 diabetes mellitus with foot ulcer Non-pressure chronic ulcer of other part of right foot with other specified severity Type 2 diabetes mellitus with diabetic peripheral angiopathy without gangrene Type 2 diabetes mellitus with diabetic polyneuropathy Plan AUNDREA, HIGGINBOTHAM ([id number]) [phone].pdf Page 6 of 7 Follow-up Appointments: Return Appointment in 1 week. - Dr. Emeline Darling (Room 9) 09/28/23 at 1115am Return Appointment in 2 weeks. - Dr. Leanord Hawking (Room 9) 10/05/2023 1115 Anesthetic: (In clinic) Topical Lidocaine 4% applied to wound bed - Used in Clinic Bathing/ Shower/ Hygiene: May shower and wash wound with soap and water. - After shower, may change wound dressing if so desired Home Health: Wound #5 Right Amputation Site - Transmetatarsal: No change in wound care orders this week; continue Home Health for wound care. May utilize formulary equivalent dressing for wound treatment orders unless otherwise specified. - use Endoform as  primary dressing place into the wound bed.Then place gauze, Kerlix tape over wound dressing. Other Home Health Orders/Instructions: - Wound Care by [facility] at least twice a week. WOUND #5: - Amputation Site - Transmetatarsal Wound Laterality: Right Cleanser: Soap and Water 3 x Per Week/30 Days Discharge Instructions: May shower and wash wound with dial antibacterial soap and water prior to dressing change. Cleanser: Vashe 5.8 (oz) 3 x Per Week/30 Days Discharge Instructions: Cleanse the wound with Vashe prior to applying a clean dressing using gauze sponges, not tissue or cotton balls. Cleanser: Wound Cleanser 3 x Per Week/30 Days Discharge Instructions: Cleanse the wound with wound cleanser prior to applying a clean dressing using gauze sponges, not tissue or cotton balls. Prim Dressing: Endoform 2x2 in 3 x Per Week/30 Days ary Discharge Instructions: Moisten with saline Secondary Dressing: Woven Gauze Sponge, Non-Sterile 4x4 in 3 x Per Week/30 Days Discharge Instructions: Apply over primary dressing as directed. Secured With: [employer], 4.5x3.1 (in/yd) 3 x Per Week/30 Days Discharge Instructions: Secure with Kerlix as directed. 1. I am continuing endoform as the primary dressing 2. Careful attention to the depth of this wound area. Options would be the Iodoflex or even an advanced treatment option here. 3. Will need to see what we know about the underlying tissue here and whether he has ever had advanced imaging Electronic Signature(s) Signed: 09/21/2023 4:54:50 PM By: Baltazar Najjar MD Entered By: Baltazar Najjar on 09/21/2023 10:35:12 -------------------------------------------------------------------------------- SuperBill Details Patient Name: Date of Service: [name] 09/21/2023 Medical Record Number: [id number] Patient Account Number: [id number] Date of Birth/Sex: Treating RN: 12-Feb-1941 (82 y.o. Tammy Sours Primary Care Provider: [name]  Other Clinician: Referring Provider: Treating Provider/Extender: Erasmo Downer in Treatment: 10 Diagnosis Coding ICD-10 Codes Code Description E11.621 Type 2 diabetes mellitus with foot ulcer L97.518 Non-pressure chronic ulcer of other part of right foot with other specified severity E11.51 Type 2 diabetes mellitus with diabetic peripheral angiopathy without gangrene E11.42 Type 2 diabetes mellitus with diabetic polyneuropathy Facility Procedures : CPT4 Code: 32202542 Description: 99213 - WOUND CARE VISIT-LEV 3 EST PT Modifier: Quantity: 1 Physician Procedures : CPT4 Code Description Modifier [id number] 99213 - WC PHYS LEVEL 3 - EST PT ICD-10 Diagnosis Description E11.621 Type 2 diabetes mellitus with foot ulcer L97.518 Non-pressure chronic ulcer of other part of right foot with other specified severity BOBBYE, REINITZ ([id number]) [id number] Page Quantity: 1 7 of 7 Electronic Signature(s) Signed: 09/21/2023 4:54:50  PM By: Baltazar Najjar MD Entered By: Baltazar Najjar on 09/21/2023 10:35:35

## 2023-09-23 NOTE — Progress Notes (Signed)
OLUFEMI, AMADEO ([id number]) [id number] Page 1 of 9 Visit Report for 09/21/2023 Arrival Information Details Patient Name: Date of Service: Jason [name], Jason [name] 09/21/2023 12:30 PM Medical Record Number: [id number] Patient Account Number: [id number] Date of Birth/Sex: Treating RN: October 18, 1941 (82 y.o. [name] Primary Care Rochanda Harpham: [name] Other Clinician: Referring Shagun Wordell: Treating Myiesha Edgar/Extender: Erasmo Downer in Treatment: 10 Visit Information History Since Last Visit Added or deleted any medications: No Patient Arrived: Ambulatory Any new allergies or adverse reactions: No Arrival Time: 12:45 Had a fall or experienced change in No Accompanied By: spouse activities of daily living that may affect Transfer Assistance: None risk of falls: Patient Identification Verified: Yes Signs or symptoms of abuse/neglect since last visito No Patient Has Alerts: Yes Hospitalized since last visit: No Patient Alerts: ABI R 1.46 (03/29/23) Implantable device outside of the clinic excluding No ABI L 1.63 (03/29/23) cellular tissue based products placed in the center since last visit: Has Dressing in Place as Prescribed: Yes Pain Present Now: No Electronic Signature(s) Signed: 09/22/2023 6:28:11 PM By: Karie Schwalbe RN Entered By: Karie Schwalbe on 09/21/2023 12:46:32 -------------------------------------------------------------------------------- Clinic Level of Care Assessment Details Patient Name: Date of Service: Jason [name], Jason [name] 09/21/2023 12:30 PM Medical Record Number: [id number] Patient Account Number: [id number] Date of Birth/Sex: Treating RN: 12/30/1940 (82 y.o. Tammy Sours Primary Care Bentlie Withem: [name] Other Clinician: Referring Amrita Radu: Treating Dash Cardarelli/Extender: Erasmo Downer in Treatment: 10 Clinic Level of Care Assessment Items TOOL 4 Quantity Score X- 1 0 Use when only an EandM is  performed on FOLLOW-UP visit ASSESSMENTS - Nursing Assessment / Reassessment X- 1 10 Reassessment of Co-morbidities (includes updates in patient status) X- 1 5 Reassessment of Adherence to Treatment Plan ASSESSMENTS - Wound and Skin A ssessment / Reassessment X - Simple Wound Assessment / Reassessment - one wound 1 5 []  - 0 Complex Wound Assessment / Reassessment - multiple wounds []  - 0 Dermatologic / Skin Assessment (not related to wound area) ASSESSMENTS - Focused Assessment X- 1 5 Circumferential Edema Measurements - multi extremities []  - 0 Nutritional Assessment / Counseling / Intervention KALAI, ROYES ([id number]) [id number] Page 2 of 9 []  - 0 Lower Extremity Assessment (monofilament, tuning fork, pulses) []  - 0 Peripheral Arterial Disease Assessment (using hand held doppler) ASSESSMENTS - Ostomy and/or Continence Assessment and Care []  - 0 Incontinence Assessment and Management []  - 0 Ostomy Care Assessment and Management (repouching, etc.) PROCESS - Coordination of Care X - Simple Patient / Family Education for ongoing care 1 15 []  - 0 Complex (extensive) Patient / Family Education for ongoing care X- 1 10 Staff obtains Chiropractor, Records, T Results / Process Orders est X- 1 10 Staff telephones HHA, Nursing Homes / Clarify orders / etc []  - 0 Routine Transfer to another Facility (non-emergent condition) []  - 0 Routine Hospital Admission (non-emergent condition) []  - 0 New Admissions / Manufacturing engineer / Ordering NPWT Apligraf, etc. , []  - 0 Emergency Hospital Admission (emergent condition) X- 1 10 Simple Discharge Coordination []  - 0 Complex (extensive) Discharge Coordination PROCESS - Special Needs []  - 0 Pediatric / Minor Patient Management []  - 0 Isolation Patient Management []  - 0 Hearing / Language / Visual special needs []  - 0 Assessment of Community assistance (transportation, D/C planning, etc.) []  -  0 Additional assistance / Altered mentation []  - 0 Support Surface(s) Assessment (bed, cushion, seat, etc.) INTERVENTIONS - Wound Cleansing / Measurement X - Simple Wound  Cleansing - one wound 1 5 []  - 0 Complex Wound Cleansing - multiple wounds X- 1 5 Wound Imaging (photographs - any number of wounds) []  - 0 Wound Tracing (instead of photographs) X- 1 5 Simple Wound Measurement - one wound []  - 0 Complex Wound Measurement - multiple wounds INTERVENTIONS - Wound Dressings X - Small Wound Dressing one or multiple wounds 1 10 []  - 0 Medium Wound Dressing one or multiple wounds []  - 0 Large Wound Dressing one or multiple wounds []  - 0 Application of Medications - topical []  - 0 Application of Medications - injection INTERVENTIONS - Miscellaneous []  - 0 External ear exam []  - 0 Specimen Collection (cultures, biopsies, blood, body fluids, etc.) []  - 0 Specimen(s) / Culture(s) sent or taken to Lab for analysis []  - 0 Patient Transfer (multiple staff / Nurse, adult / Similar devices) []  - 0 Simple Staple / Suture removal (25 or less) []  - 0 Complex Staple / Suture removal (26 or more) []  - 0 Hypo / Hyperglycemic Management (close monitor of Blood Glucose) Jason [name], Jason [name] ([id number]) [id number] Page 3 of 9 []  - 0 Ankle / Brachial Index (ABI) - do not check if billed separately X- 1 5 Vital Signs Has the patient been seen at the hospital within the last three years: Yes Total Score: 100 Level Of Care: New/Established - Level 3 Electronic Signature(s) Signed: 09/21/2023 6:03:26 PM By: Shawn Stall RN, BSN Entered By: Shawn Stall on 09/21/2023 13:11:57 -------------------------------------------------------------------------------- Encounter Discharge Information Details Patient Name: Date of Service: Jason Milian S. 09/21/2023 12:30 PM Medical Record Number: [id number] Patient Account Number: [id number] Date of Birth/Sex: Treating  RN: 05/30/1941 (82 y.o. Tammy Sours Primary Care Emric Kowalewski: [name] Other Clinician: Referring Ritaj Dullea: Treating Algenis Ballin/Extender: Erasmo Downer in Treatment: 10 Encounter Discharge Information Items Discharge Condition: Stable Ambulatory Status: Wheelchair Discharge Destination: Home Transportation: Private Auto Accompanied By: wife Schedule Follow-up Appointment: Yes Clinical Summary of Care: Electronic Signature(s) Signed: 09/21/2023 6:03:26 PM By: Shawn Stall RN, BSN Entered By: Shawn Stall on 09/21/2023 13:12:37 -------------------------------------------------------------------------------- Lower Extremity Assessment Details Patient Name: Date of Service: Jason [name]. 09/21/2023 12:30 PM Medical Record Number: [id number] Patient Account Number: [id number] Date of Birth/Sex: Treating RN: 03-16-1941 (82 y.o. [name] Primary Care Renai Lopata: [name] Other Clinician: Referring Syd Newsome: Treating Preeya Cleckley/Extender: Erasmo Downer in Treatment: 10 Edema Assessment Assessed: Kyra Searles: No] [Right: No] Edema: [Left: N] [Right: o] Calf Left: Right: Point of Measurement: 38 cm From Medial Instep 41.8 cm Ankle Left: Right: Point of Measurement: 11 cm From Medial Instep 27.3 cm Vascular Assessment Jason [name], Jason [name] ([id number]) [Right:[id number] Page 4 of 9] Pulses: Dorsalis Pedis Palpable: [Right:Yes] Extremity colors, hair growth, and conditions: Extremity Color: [Right:Pale] Hair Growth on Extremity: [Right:No] Temperature of Extremity: [Right:Warm] Dependent Rubor: [Right:No No] Electronic Signature(s) Signed: 09/22/2023 6:28:11 PM By: Karie Schwalbe RN Entered By: Karie Schwalbe on 09/21/2023 12:53:04 -------------------------------------------------------------------------------- Multi Wound Chart Details Patient Name: Date of Service: Jason Milian S. 09/21/2023 12:30  PM Medical Record Number: [id number] Patient Account Number: [id number] Date of Birth/Sex: Treating RN: 05-Jun-1941 (82 y.o. M) Primary Care Sandralee Tarkington: [name] Other Clinician: Referring Shalie Schremp: Treating Breniya Goertzen/Extender: Erasmo Downer in Treatment: 10 Vital Signs Height(in): 80 Pulse(bpm): 80 Weight(lbs): 280 Blood Pressure(mmHg): 113/71 Body Mass Index(BMI): 30.8 Temperature(F): 98 Respiratory Rate(breaths/min): 18 [5:Photos:] [N/A:N/A] Right Amputation Site - N/A N/A Wound Location: Transmetatarsal Gradually Appeared N/A N/A Wounding Event: Diabetic Wound/Ulcer of  the Lower N/A N/A Primary Etiology: Extremity Sleep Apnea, Arrhythmia, Congestive N/A N/A Comorbid History: Heart Failure, Peripheral Arterial Disease, Peripheral Venous Disease, Type II Diabetes, Neuropathy 04/19/2023 N/A N/A Date Acquired: 10 N/A N/A Weeks of Treatment: Open N/A N/A Wound Status: No N/A N/A Wound Recurrence: 0.3x0.2x0.5 N/A N/A Measurements L x W x D (cm) 0.047 N/A N/A A (cm) : rea 0.024 N/A N/A Volume (cm) : 98.60% N/A N/A % Reduction in A rea: 98.20% N/A N/A % Reduction in Volume: 4 Position 1 (o'clock): 0.4 Maximum Distance 1 (cm): 12 Starting Position 1 (o'clock): 6 Ending Position 1 (o'clock): 0.3 Maximum Distance 1 (cm): Yes N/A N/A Tunneling: Yes N/A N/A Undermining: Grade 2 N/A N/A Classification: Medium N/A N/A Exudate A mount: Serosanguineous N/A N/A Exudate TypeRORKE, PAGLIARULO ([id number]) [id number] Page 5 of 9 red, brown N/A N/A Exudate Color: Distinct, outline attached N/A N/A Wound Margin: Large (67-100%) N/A N/A Granulation Amount: Pink N/A N/A Granulation Quality: Small (1-33%) N/A N/A Necrotic Amount: Fat Layer (Subcutaneous Tissue): Yes N/A N/A Exposed Structures: Fascia: No Tendon: No Muscle: No Joint: No Bone: No Small (1-33%) N/A N/A Epithelialization: Callus: Yes N/A  N/A Periwound Skin Texture: Scarring: Yes Dry/Scaly: Yes N/A N/A Periwound Skin Moisture: No Abnormalities Noted N/A N/A Periwound Skin Color: No Abnormality N/A N/A Temperature: Treatment Notes Wound #5 (Amputation Site - Transmetatarsal) Wound Laterality: Right Cleanser Soap and Water Discharge Instruction: May shower and wash wound with dial antibacterial soap and water prior to dressing change. Vashe 5.8 (oz) Discharge Instruction: Cleanse the wound with Vashe prior to applying a clean dressing using gauze sponges, not tissue or cotton balls. Wound Cleanser Discharge Instruction: Cleanse the wound with wound cleanser prior to applying a clean dressing using gauze sponges, not tissue or cotton balls. Peri-Wound Care Topical Primary Dressing Endoform 2x2 in Discharge Instruction: Moisten with saline Secondary Dressing Woven Gauze Sponge, Non-Sterile 4x4 in Discharge Instruction: Apply over primary dressing as directed. Secured With [employer], 4.5x3.1 (in/yd) Discharge Instruction: Secure with Kerlix as directed. Compression Wrap Compression Stockings Add-Ons Electronic Signature(s) Signed: 09/21/2023 4:54:50 PM By: Baltazar Najjar MD Entered By: Baltazar Najjar on 09/21/2023 13:29:59 -------------------------------------------------------------------------------- Multi-Disciplinary Care Plan Details Patient Name: Date of Service: Jason Milian S. 09/21/2023 12:30 PM Medical Record Number: [id number] Patient Account Number: [id number] Date of Birth/Sex: Treating RN: [birth year] (82 y.o. Tammy Sours Primary Care Merlie Noga: [name] Other Clinician: Referring Aubreana Cornacchia: Treating Saniyah Mondesir/Extender: Erasmo Downer in Treatment: [address] [place], [place] Winifred Olive ([id number]) [id number] Page 6 of 9 Wound/Skin Impairment Nursing Diagnoses: Impaired tissue integrity Goals: Patient/caregiver will verbalize  understanding of skin care regimen Date Initiated: 07/13/2023 Target Resolution Date: 11/14/2023 Goal Status: Active Interventions: Assess ulceration(s) every visit Treatment Activities: Skin care regimen initiated : 07/13/2023 Notes: Electronic Signature(s) Signed: 09/21/2023 6:03:26 PM By: Shawn Stall RN, BSN Entered By: Shawn Stall on 09/21/2023 12:56:19 -------------------------------------------------------------------------------- Pain Assessment Details Patient Name: Date of Service: Jason Milian S. 09/21/2023 12:30 PM Medical Record Number: [id number] Patient Account Number: [id number] Date of Birth/Sex: Treating RN: [birth year] (82 y.o. [name] Primary Care Daelin Haste: [name] Other Clinician: Referring Winslow Ederer: Treating Clary Boulais/Extender: Erasmo Downer in Treatment: 10 Active Problems Location of Pain Severity and Description of Pain Patient Has Paino Yes Site Locations Pain Location: Generalized Pain With Dressing Change: No Duration of the Pain. Constant / Intermittento Constant Rate the pain. Current Pain Level: 4 Worst Pain Level: 10 Least Pain Level: 4  Tolerable Pain Level: 4 Character of Pain Describe the Pain: Difficult to Pinpoint Pain Management and Medication Current Pain Management: Medication: Yes Cold Application: No Rest: Yes Massage: No Activity: No T.E.N.S.: No Heat Application: No Leg drop or elevation: No Is the Current Pain Management Adequate: Adequate How does your wound impact your activities of daily [address] DANIELS, THOMA ([id number]) [id number] Page 7 of 9 Sleep: No Bathing: No Appetite: No Relationship With Others: No Bladder Continence: No Emotions: No Bowel Continence: No Work: No Toileting: No Drive: No Dressing: No Hobbies: No Electronic Signature(s) Signed: 09/22/2023 6:28:11 PM By: Karie Schwalbe RN Entered By: Karie Schwalbe on 09/21/2023  12:51:35 -------------------------------------------------------------------------------- Patient/Caregiver Education Details Patient Name: Date of Service: Jason [name] 11/6/2024andnbsp12:30 PM Medical Record Number: [id number] Patient Account Number: [id number] Date of Birth/Gender: Treating RN: 06/29/1941 (82 y.o. Tammy Sours Primary Care Physician: [name] Other Clinician: Referring Physician: Treating Physician/Extender: Erasmo Downer in Treatment: 10 Education Assessment Education Provided To: Patient Education Topics Provided Wound/Skin Impairment: Handouts: Caring for Your Ulcer Methods: Explain/Verbal Responses: Reinforcements needed Electronic Signature(s) Signed: 09/21/2023 6:03:26 PM By: Shawn Stall RN, BSN Entered By: Shawn Stall on 09/21/2023 12:56:30 -------------------------------------------------------------------------------- Wound Assessment Details Patient Name: Date of Service: Jason [name]. 09/21/2023 12:30 PM Medical Record Number: [id number] Patient Account Number: [id number] Date of Birth/Sex: Treating RN: 02/02/1941 (82 y.o. [name] Primary Care Sher Shampine: [name] Other Clinician: Referring Izan Miron: Treating Linzy Laury/Extender: Erasmo Downer in Treatment: 10 Wound Status Wound Number: 5 Primary Diabetic Wound/Ulcer of the Lower Extremity Etiology: Wound Location: Right Amputation Site - Transmetatarsal Wound Open Wounding Event: Gradually Appeared Status: Date Acquired: 04/19/2023 Comorbid Sleep Apnea, Arrhythmia, Congestive Heart Failure, Peripheral Weeks Of Treatment: 10 History: Arterial Disease, Peripheral Venous Disease, Type II Diabetes, Clustered Wound: No Neuropathy Photos Jason [name], Jason [name] ([id number]) [id number] Page 8 of 9 Wound Measurements Length: (cm) 0.3 Width: (cm) 0.2 Depth: (cm) 0.5 Area: (cm) 0.047 Volume: (cm) 0.024 %  Reduction in Area: 98.6% % Reduction in Volume: 98.2% Epithelialization: Small (1-33%) Tunneling: Yes Position (o'clock): 4 Maximum Distance: (cm) 0.4 Undermining: Yes Starting Position (o'clock): 12 Ending Position (o'clock): 6 Maximum Distance: (cm) 0.3 Wound Description Classification: Grade 2 Wound Margin: Distinct, outline attached Exudate Amount: Medium Exudate Type: Serosanguineous Exudate Color: red, brown Foul Odor After Cleansing: No Slough/Fibrino Yes Wound Bed Granulation Amount: Large (67-100%) Exposed Structure Granulation Quality: Pink Fascia Exposed: No Necrotic Amount: Small (1-33%) Fat Layer (Subcutaneous Tissue) Exposed: Yes Necrotic Quality: Adherent Slough Tendon Exposed: No Muscle Exposed: No Joint Exposed: No Bone Exposed: No Periwound Skin Texture Texture Color No Abnormalities Noted: No No Abnormalities Noted: Yes Callus: Yes Temperature / Pain Scarring: Yes Temperature: No Abnormality Moisture No Abnormalities Noted: No Dry / Scaly: Yes Treatment Notes Wound #5 (Amputation Site - Transmetatarsal) Wound Laterality: Right Cleanser Soap and Water Discharge Instruction: May shower and wash wound with dial antibacterial soap and water prior to dressing change. Vashe 5.8 (oz) Discharge Instruction: Cleanse the wound with Vashe prior to applying a clean dressing using gauze sponges, not tissue or cotton balls. Wound Cleanser Discharge Instruction: Cleanse the wound with wound cleanser prior to applying a clean dressing using gauze sponges, not tissue or cotton balls. Peri-Wound Care Topical Primary Dressing Endoform 2x2 in Discharge Instruction: Moisten with saline Secondary Dressing Woven Gauze Sponge, Non-Sterile 4x4 in Jason [name], Jason [name] ([id number]) [id number] Page 9 of 9 Discharge Instruction: Apply over primary dressing as directed. Secured With [employer]  Roll Sterile, 4.5x3.1 (in/yd) Discharge Instruction:  Secure with Kerlix as directed. Compression Wrap Compression Stockings Add-Ons Electronic Signature(s) Signed: 09/22/2023 6:28:11 PM By: Karie Schwalbe RN Entered By: Karie Schwalbe on 09/21/2023 13:06:27 -------------------------------------------------------------------------------- Vitals Details Patient Name: Date of Service: Jason Milian S. 09/21/2023 12:30 PM Medical Record Number: [id number] Patient Account Number: [id number] Date of Birth/Sex: Treating RN: January 04, 1941 (82 y.o. [name] Primary Care Shamirah Ivan: [name] Other Clinician: Referring Timothy Trudell: Treating Shani Fitch/Extender: Erasmo Downer in Treatment: 10 Vital Signs Time Taken: 12:46 Temperature (F): 98 Height (in): 80 Pulse (bpm): 80 Weight (lbs): 280 Respiratory Rate (breaths/min): 18 Body Mass Index (BMI): 30.8 Blood Pressure (mmHg): 113/71 Reference Range: 80 - 120 mg / dl Electronic Signature(s) Signed: 09/22/2023 6:28:11 PM By: Karie Schwalbe RN Entered By: Karie Schwalbe on 09/21/2023 12:48:56

## 2023-09-28 ENCOUNTER — Encounter (HOSPITAL_BASED_OUTPATIENT_CLINIC_OR_DEPARTMENT_OTHER): Payer: Medicare HMO | Admitting: Internal Medicine

## 2023-09-28 DIAGNOSIS — E11621 Type 2 diabetes mellitus with foot ulcer: Secondary | ICD-10-CM | POA: Diagnosis not present

## 2023-09-29 ENCOUNTER — Ambulatory Visit (INDEPENDENT_AMBULATORY_CARE_PROVIDER_SITE_OTHER): Payer: Medicare HMO | Admitting: Podiatry

## 2023-09-29 DIAGNOSIS — L97412 Non-pressure chronic ulcer of right heel and midfoot with fat layer exposed: Secondary | ICD-10-CM | POA: Diagnosis not present

## 2023-09-29 DIAGNOSIS — Z89431 Acquired absence of right foot: Secondary | ICD-10-CM

## 2023-09-29 DIAGNOSIS — E1142 Type 2 diabetes mellitus with diabetic polyneuropathy: Secondary | ICD-10-CM

## 2023-09-29 MED ORDER — IODOSORB 0.9 % EX GEL: 1.0000 | Freq: Every day | CUTANEOUS | 0 refills | Status: DC | PRN

## 2023-09-29 NOTE — Progress Notes (Signed)
  Subjective:  Patient ID: Jason [name], male    DOB: [birth year],  MRN: [id number]  DOS: 03/30/2023 Procedure: Transmetatarsal amputation right foot  82 y.o. male returns for post-op check.  Patient is approximately 6 months from trans met amp of right foot.  Patient seeing wound care.  Reports that the wound seems to be improving.  Weightbearing as tolerated in a postop shoe.  Has wound care via home health nurse who comes out twice a week.  Has been doing iodoform dressing daily.   Review of Systems: Negative except as noted in the HPI. Denies N/V/F/Ch.   Objective:  There were no vitals filed for this visit. There is no height or weight on file to calculate BMI. Constitutional Well developed. Well nourished.  Vascular Foot warm and well perfused. Capillary refill normal to all digits.  Calf is soft and supple, no posterior calf or knee pain, negative Homans' sign  Neurologic Normal speech. Oriented to person, place, and time. Epicritic sensation to light touch grossly absent to right foot  Dermatologic Distal circular ulceration present along the lateral aspect of the amputation site on the right foot.  There no tunneling. Wound measures approximately 0.6 x 0.4 x 0.3 cm Does not probe to bone.  Healthy granular bleeding tissue at the wound base. Improving    Orthopedic: S/p R foot tMA. Nontender to palpation noted about the surgical site secondary to neuropathy.   Multiple view plain film radiographs: Deferred Assessment:   1. S/P transmetatarsal amputation of foot, right (HCC)   2. Ulcer of right midfoot with fat layer exposed (HCC)   3. DM type 2 with diabetic peripheral neuropathy (HCC)      Plan:  Patient was evaluated and treated and all questions answered.  S/p foot surgery right transmetatarsal amputation with ulceration present at the amputation site -Much improved from prior with decreased tunneling wound overall healing and smaller in size than previously  appreciate wound care assistance with wound healing -Patient is currently finished with oral antibiotics.  Hold on further antibiotics at this time will consider future antibiotics if needed if the wound worsens -XR: Deferred at this visit -patient had x-ray on 07/21/2023 that did not show any residual osteomyelitis at the amputation site but did show heterotropic ossification -WB Status: WBAT in post op shoe -Sutures: Previously removed -Wounds debrided to healthy bleeding base as below, removed hyperkeratotic tissue surrounding the forefoot -Medications: No further abx indicated -Foot redressed with adhesive dressing -Recommend patient continue daily dressing changes with iodoform and gauze daily patient to have iodoform -Continue wound care every 2 weeks -Recommend DM shoes and liners  Procedure: Excisional Debridement of Wound Rationale: Removal of non-viable soft tissue from the wound to promote healing.  Anesthesia: none Post-Debridement Wound Measurements: 0.6 cm x 0.4 cm x 0.3 cm  Type of Debridement: Sharp Excisional Tissue Removed: Non-viable soft tissue Depth of Debridement: subcutaneous tissue. Technique: Sharp excisional debridement to bleeding, viable wound base.  Dressing: Dry, sterile, compression dressing. Disposition: Patient tolerated procedure well.   Return in about 4 weeks (around 10/27/2023) for f/u R foot ulcer.         Corinna Gab, DPM [facility] / [facility]

## 2023-09-29 NOTE — Progress Notes (Addendum)
Jason, [name] ([id number]) [id number] Page 1 of 8 Visit Report for 09/28/2023 Debridement Details Patient Name: Date of Service: Jason [name], Jason [name] 09/28/2023 11:15 A M Medical Record Number: [id number] Patient Account Number: [id number] Date of Birth/Sex: Treating RN: Aug 07, 1941 (82 y.o. M) Primary Care Provider: [name] Other Clinician: Referring Provider: Treating Provider/Extender: Erasmo Downer in Treatment: 11 Debridement Performed for Assessment: Wound #5 Right Amputation Site - Transmetatarsal Performed By: Physician Maxwell Caul., MD The following information was scribed by: Karie Schwalbe The information was scribed for: Baltazar Najjar Debridement Type: Debridement Severity of Tissue Pre Debridement: Fat layer exposed Level of Consciousness (Pre-procedure): Awake and Alert Pre-procedure Verification/Time Out Yes - 11:45 Taken: Start Time: 11:45 Pain Control: Lidocaine 4% T opical Solution Percent of Wound Bed Debrided: 100% T Area Debrided (cm): otal 0.07 Tissue and other material debrided: Viable, Non-Viable, Callus, Subcutaneous, Skin: Dermis , Skin: Epidermis Level: Skin/Subcutaneous Tissue Debridement Description: Excisional Instrument: Curette Bleeding: Minimum Hemostasis Achieved: Pressure Response to Treatment: Procedure was tolerated well Level of Consciousness (Post- Awake and Alert procedure): Post Debridement Measurements of Total Wound Length: (cm) 0.3 Width: (cm) 0.3 Depth: (cm) 0.4 Volume: (cm) 0.028 Character of Wound/Ulcer Post Debridement: Improved Severity of Tissue Post Debridement: Fat layer exposed Post Procedure Diagnosis Same as Pre-procedure Electronic Signature(s) Signed: 09/29/2023 4:06:32 PM By: Baltazar Najjar MD Entered By: Baltazar Najjar on 09/28/2023 12:07:44 -------------------------------------------------------------------------------- HPI Details Patient Name:  Date of Service: Jason Milian S. 09/28/2023 11:15 A M Medical Record Number: [id number] Patient Account Number: [id number] Date of Birth/Sex: Treating RN: 03/02/1941 (82 y.o. M) Primary Care Provider: [name] Other Clinician: Referring Provider: Treating Provider/Extender: Erasmo Downer in Treatment: 11 History of Present Illness Jason, [name] ([id number]) [id number] Page 2 of 8 Chronic/Inactive Conditions Condition 1: He was followed for a period of time by Dr. Durwin Nora of vein and vascular as far as I can see last saw him on 1//24. 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 Last arterial studies I see showed noncompressible ABIs bilaterally but multiphasic waveforms on the right but a TBI of 0.44 on the left HPI Description: 01/31/17; this is a 82 year old man who has a history of type 2 diabetes on oral agents. He has a history of wound difficulties however this is on his right foot in fact he has had amputations of his medial 3 toes. I did not actually look at the right foot today. He tells me about a month ago he was cutting his mycotic nails informed a wound on the medial aspect of the left great toe. He saw Dr. Logan Bores of podiatry. He has been using Neosporin for a while and now simply wrapping it with gauze. He states he does have peripheral neuropathy. His hemoglobin A1c in this clinic was 1. 02/21/1989 non-compressible on the left. He did have vascular studies done in 2015 showing ABIs of 1.4 bilaterally, TBI's of 1.0 on the right 1.1 on the left with triphasic waveforms. He has not had imaging studies that I'm aware of 02/07/17; this is a 82 year old man with type 2 diabetes on oral agents. [place] with a small open area on the medial aspect of his left great toe. This is in the setting of  a significant mycotic nail for which he follows with Dr. Logan Bores of  podiatry READMISSION 07/13/2023 This is a now 82 year old man who is had problems with a wound on his right foot amputation site after undergoing a transmetatarsal amputation in May of this year. He was admitted to hospital from 03/29/2023 through 04/02/2023 with sepsis felt to be secondary to an infected wound I think on the right first metatarsal head he underwent a transmetatarsal amputation by podiatry in the hospital. A culture of the wound had previously shown Klebsiella. He was seen in follow-up in the office on 04/19/2023 with a postop wound infection and was given doxycycline and Augmentin for 14 days. He has been dressing the wounds with Betadine wet-to-dry. Looking through epic it would appear that the follow-up with the podiatry group has been sporadic. The patient tells me he was at [facility] skilled facility although he is back at home now. His wound now is actually probably between the fourth and fifth metatarsal head on the amputation site. He has a smaller wound on the dorsal foot in this area and I am sure at 1 time these connected but I could not prove this today. He just came in with kerlix and dry gauze on the wound. He was followed for a period of time by Dr. Durwin Nora of vein and vascular as far as I can see last saw him on 1//24. 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 Last arterial studies I see showed noncompressible ABIs bilaterally but multiphasic waveforms on the right but a TBI of 0.44 on the left Past medical history includes type 2 diabetes with peripheral neuropathy and PAD, diastolic heart failure, mitral valve stenosis, atrial fibrillation on Xarelto, sleep apnea ABIs were not repeated today in the clinic as they are previously noncompressible as noted His MRI is listed  below; This did not suggest osteomyelitis in the area of the right foot where his wound is currently located MRI 03/29/23 IMPRESSION: 1. Deep skin wound about the plantar aspect of the first metatarsal head with mild marrow edema about the plantar aspect of the first metatarsal head concerning for osteomyelitis. 2. No fluid collection or abscess. 3. Postsurgical changes with prior amputation of the right foot through the metatarsophalangeal joints of the first and fifth digit and through metatarsal heads of the second through fourth digits. 4. Edema of the plantar muscles and tendons suggesting diabetic myopathy/myositis. 07-20-2023 upon evaluation today patient presents for follow-up evaluation here in the clinic. I did review his note from the last visit with Dr. Leanord Hawking. With that being said he is a current patient of vein and vascular here in [place] and has seen them last it looks like according to Dr. Jannetta Quint note in January of this year. At that point he had multiphasic waveforms on the right with a TBI of 0.44 on the left. I do not see any recent x-rays currently although he has seen podiatry I cannot see any of those x-rays. I really think having him go for a x-ray of the foot would be a good idea. 07-27-2023 upon evaluation today patient appears to be doing well currently in regard to his wound. He is actually showing signs of good improvement. I actually feel like this is significantly improved compared to last week's evaluation he does seem to have good granulation and epithelization and very pleased there is some callus I am going to need to remove however. 08-10-2023 upon evaluation today  patient appears to be doing well currently in regard to his wound. The foot actually appears to be showing signs of excellent improvement and I am very pleased in that regard. Fortunately I do not see any evidence of worsening overall and I do believe that the patient is making good headway towards  closure. For now I am probably going to recommend that we continue with the [facility] although we may make a switch to collagen in the near future depending on how things progress. 08-24-2023 upon evaluation today patient appears to be doing well currently in regard to his wound. He has been tolerating the dressing changes I do believe it may be good to switch over to endoform at this point he is in agreement with that plan. Fortunately I do not see any evidence of infection or worsening overall and I am pleased in that regard. 08-31-2023 upon evaluation today patient's wound bed actually showed signs of good granulation epithelization at this point. Fortunately I do not see any signs of infection he seems to be doing quite well based on what I am seeing today. 09-07-2023 upon evaluation today patient appears to be doing well currently in regard to his wound. Has been tolerating the dressing changes without complication. Fortunately I do not see any signs of active infection locally or systemically at this time which is great news. No fevers, chills, nausea, vomiting, or diarrhea. 09-14-2023 upon evaluation today patient appears to be doing excellent in regard to his foot ulcer. This is getting very close to complete resolution. Fortunately I do not see any evidence of worsening overall and I do believe that the patient is making excellent headway towards complete closure. 11/6; this is a wound on the right TMA tip at the level roughly of the fourth metatarsal. Small wound with 0.5 cm of depth we have been using endoform ABDs and gauze. 11/13; right TMA at roughly the fourth metatarsal. The surface callus raised the possibility that there was healing a healed state here. We have been using endoform ABDs and gauze. Electronic Signature(s) Signed: 09/29/2023 4:06:32 PM By: Baltazar Najjar MD Entered By: Baltazar Najjar on 09/28/2023 12:10:03 [name] ([id number])  [id number] Page 3 of 8 -------------------------------------------------------------------------------- Physical Exam Details Patient Name: Date of Service: JARAAD, FUGITT 09/28/2023 11:15 A M Medical Record Number: [id number] Patient Account Number: [id number] Date of Birth/Sex: Treating RN: 19-Jul-1941 (83 y.o. M) Primary Care Provider: [name] Other Clinician: Referring Provider: Treating Provider/Extender: Erasmo Downer in Treatment: 11 Constitutional Sitting or standing Blood Pressure is within target range for patient.. Pulse regular and within target range for patient.Marland Kitchen Respirations regular, non-labored and within target range.. Temperature is normal and within the target range for the patient.Marland Kitchen Appears in no distress. Notes Wound exam; punched-out area over the right lateral TMA at roughly the fourth met head. The etiology is not clear. Roughly 0.5 cm in depth but some undermining. I used a #3 curette to remove the surface eschar, this was not healed underneath this. About no change since last week. No purulence however, no evidence of soft tissue infection Electronic Signature(s) Signed: 09/29/2023 4:06:32 PM By: Baltazar Najjar MD Entered By: Baltazar Najjar on 09/28/2023 12:10:59 -------------------------------------------------------------------------------- Physician Orders Details Patient Name: Date of Service: Jason Milian S. 09/28/2023 11:15 A M Medical Record Number: [id number] Patient Account Number: [id number] Date of Birth/Sex: Treating RN: 07-02-1941 (82 y.o. [name] Primary Care Provider: [name] Other Clinician: Referring Provider: Treating  Provider/Extender: Erasmo Downer in Treatment: 11 Verbal / Phone Orders: No Diagnosis Coding Follow-up Appointments ppointment in 1 week. - Dr. Emeline Darling (Room 9) 10/05/23 at 11:15am Return A ppointment in 2 weeks. - Dr.  Bernie Covey week-can skip this week appointment wise. Return A Return appointment in 3 weeks. - Dr. Leanord Hawking Please schedule an appointment with front desk Anesthetic (In clinic) Topical Lidocaine 4% applied to wound bed - Used in Clinic Bathing/ Shower/ Hygiene May shower and wash wound with soap and water. - After shower, may change wound dressing if so desired Home Health Wound #5 Right Amputation Site - Transmetatarsal Discontinue home health for wound care. - Discontinue Home Health Other Home Health Orders/Instructions: - Wound Care by [facility] at least twice a week.-D/C'd due to no insurance authorization (09/28/23) Wound Treatment Wound #5 - Amputation Site - Transmetatarsal Wound Laterality: Right Cleanser: Normal Saline (DME) (Generic) 3 x Per Week/30 Days Discharge Instructions: use to slightly moisten Endoform before placing in the wound bed Cleanser: Soap and Water 3 x Per Week/30 Days Discharge Instructions: May shower and wash wound with dial antibacterial soap and water prior to dressing change. Cleanser: Vashe 5.8 (oz) 3 x Per Week/30 Days SALIM, ALONGE ([id number]) [id number].pdf Page 4 of 8 Discharge Instructions: Cleanse the wound with Vashe prior to applying a clean dressing using gauze sponges, not tissue or cotton balls. Cleanser: Wound Cleanser 3 x Per Week/30 Days Discharge Instructions: Cleanse the wound with wound cleanser prior to applying a clean dressing using gauze sponges, not tissue or cotton balls. Prim Dressing: Endoform 2x2 in (DME) (Generic) 3 x Per Week/30 Days ary Discharge Instructions: Moisten with saline Secondary Dressing: ABD Pad, 5x9 (DME) (Generic) 3 x Per Week/30 Days Discharge Instructions: Apply over primary dressing as directed. Secondary Dressing: Woven Gauze Sponge, Non-Sterile 4x4 in (DME) (Generic) 3 x Per Week/30 Days Discharge Instructions: Apply over primary dressing as  directed. Secured With: [employer], 4.5x3.1 (in/yd) (DME) (Generic) 3 x Per Week/30 Days Discharge Instructions: Secure with Kerlix as directed. Secured With: Paper Tape, 2x10 (in/yd) (DME) (Generic) 3 x Per Week/30 Days Discharge Instructions: Secure dressing with tape as directed. Electronic Signature(s) Signed: 09/28/2023 5:11:37 PM By: Karie Schwalbe RN Signed: 09/29/2023 4:06:32 PM By: Baltazar Najjar MD Entered By: Karie Schwalbe on 09/28/2023 12:34:28 -------------------------------------------------------------------------------- Problem List Details Patient Name: Date of Service: Jason Milian S. 09/28/2023 11:15 A M Medical Record Number: [id number] Patient Account Number: [id number] Date of Birth/Sex: Treating RN: 1940-11-19 (82 y.o. M) Primary Care Provider: [name] Other Clinician: Referring Provider: Treating Provider/Extender: Erasmo Downer in Treatment: 11 Active Problems ICD-10 Encounter Code Description Active Date MDM Diagnosis E11.621 Type 2 diabetes mellitus with foot ulcer 07/13/2023 No Yes L97.518 Non-pressure chronic ulcer of other part of right foot with other specified 07/13/2023 No Yes severity E11.51 Type 2 diabetes mellitus with diabetic peripheral angiopathy without gangrene 07/13/2023 No Yes E11.42 Type 2 diabetes mellitus with diabetic polyneuropathy 07/13/2023 No Yes Inactive Problems Resolved Problems Electronic Signature(s) Signed: 09/29/2023 4:06:32 PM By: Baltazar Najjar MD Entered By: Baltazar Najjar on 09/28/2023 12:07:23 [name] ([id number]) [id number] Page 5 of 8 -------------------------------------------------------------------------------- Progress Note Details Patient Name: Date of Service: TERIQUE, SKENE 09/28/2023 11:15 A M Medical Record Number: [id number] Patient Account Number: [id number] Date of Birth/Sex: Treating RN: [birth year] (82 y.o. M) Primary Care  Provider: [name] Other Clinician: Referring Provider: Treating Provider/Extender: Erasmo Downer  in Treatment: 11 Subjective History of Present Illness (HPI) Chronic/Inactive Condition: He was followed for a period of time by Dr. Durwin Nora of vein and vascular as far as I can see last saw him on 1//24. 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 Last arterial studies I see showed noncompressible ABIs bilaterally but multiphasic waveforms on the right but a TBI of 0.44 on the left 01/31/17; this is a 82 year old man who has a history of type 2 diabetes on oral agents. He has a history of wound difficulties however this is on his right foot in fact he has had amputations of his medial 3 toes. I did not actually look at the right foot today. He tells me about a month ago he was cutting his mycotic nails informed a wound on the medial aspect of the left great toe. He saw Dr. Logan Bores of podiatry. He has been using Neosporin for a while and now simply wrapping it with gauze. He states he does have peripheral neuropathy. His hemoglobin A1c in this clinic was 1. 02/21/1989 non-compressible on the left. He did have vascular studies done in 2015 showing ABIs of 1.4 bilaterally, TBI's of 1.0 on the right 1.1 on the left with triphasic waveforms. He has not had imaging studies that I'm aware of 02/07/17; this is a 82 year old man with type 2 diabetes on oral agents. [place] with a small open area on the medial aspect of his left great toe. This is in the setting of a significant mycotic nail for which he follows with Dr. Logan Bores of podiatry READMISSION 07/13/2023 This is a now 82 year old man who is had problems with a wound on his right foot amputation site after undergoing a transmetatarsal amputation in May of this year. He was admitted to  hospital from 03/29/2023 through 04/02/2023 with sepsis felt to be secondary to an infected wound I think on the right first metatarsal head he underwent a transmetatarsal amputation by podiatry in the hospital. A culture of the wound had previously shown Klebsiella. He was seen in follow-up in the office on 04/19/2023 with a postop wound infection and was given doxycycline and Augmentin for 14 days. He has been dressing the wounds with Betadine wet-to-dry. Looking through epic it would appear that the follow-up with the podiatry group has been sporadic. The patient tells me he was at [facility] skilled facility although he is back at home now. His wound now is actually probably between the fourth and fifth metatarsal head on the amputation site. He has a smaller wound on the dorsal foot in this area and I am sure at 1 time these connected but I could not prove this today. He just came in with kerlix and dry gauze on the wound. He was followed for a period of time by Dr. Durwin Nora of vein and vascular as far as I can see last saw him on 1//24. 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 Last arterial studies I see showed noncompressible ABIs bilaterally but multiphasic waveforms on the right but a TBI of 0.44 on the left Past medical history includes type 2 diabetes with peripheral neuropathy and PAD, diastolic heart failure, mitral valve stenosis, atrial fibrillation on Xarelto, sleep apnea ABIs  were not repeated today in the clinic as they are previously noncompressible as noted His MRI is listed below; This did not suggest osteomyelitis in the area of the right foot where his wound is currently located MRI 03/29/23 IMPRESSION: 1. Deep skin wound about the plantar aspect of the first metatarsal head with mild marrow edema about the plantar aspect of the first metatarsal head  concerning for osteomyelitis. 2. No fluid collection or abscess. 3. Postsurgical changes with prior amputation of the right foot through the metatarsophalangeal joints of the first and fifth digit and through metatarsal heads of the second through fourth digits. 4. Edema of the plantar muscles and tendons suggesting diabetic myopathy/myositis. 07-20-2023 upon evaluation today patient presents for follow-up evaluation here in the clinic. I did review his note from the last visit with Dr. Leanord Hawking. With that being said he is a current patient of vein and vascular here in [place] and has seen them last it looks like according to Dr. Jannetta Quint note in January of this year. At that point he had multiphasic waveforms on the right with a TBI of 0.44 on the left. I do not see any recent x-rays currently although he has seen podiatry I cannot see any of those x-rays. I really think having him go for a x-ray of the foot would be a good idea. 07-27-2023 upon evaluation today patient appears to be doing well currently in regard to his wound. He is actually showing signs of good improvement. I actually feel like this is significantly improved compared to last week's evaluation he does seem to have good granulation and epithelization and very pleased there is some callus I am going to need to remove however. 08-10-2023 upon evaluation today patient appears to be doing well currently in regard to his wound. The foot actually appears to be showing signs of excellent improvement and I am very pleased in that regard. Fortunately I do not see any evidence of worsening overall and I do believe that the patient is making good headway towards closure. For now I am probably going to recommend that we continue with the [facility] although we may make a switch to collagen in the near future depending on how things progress. 08-24-2023 upon evaluation today patient appears to be doing well currently in regard to his wound.  He has been tolerating the dressing changes I do believe it may be good to switch over to endoform at this point he is in agreement with that plan. Fortunately I do not see any evidence of infection or worsening overall DONATO, BRODERSON ([id number]) [id number] Page 6 of 8 and I am pleased in that regard. 08-31-2023 upon evaluation today patient's wound bed actually showed signs of good granulation epithelization at this point. Fortunately I do not see any signs of infection he seems to be doing quite well based on what I am seeing today. 09-07-2023 upon evaluation today patient appears to be doing well currently in regard to his wound. Has been tolerating the dressing changes without complication. Fortunately I do not see any signs of active infection locally or systemically at this time which is great news. No fevers, chills, nausea, vomiting, or diarrhea. 09-14-2023 upon evaluation today patient appears to be doing excellent in regard to his foot ulcer. This is getting very close to complete resolution. Fortunately I do not see any evidence of worsening overall and I do believe that the patient is making excellent headway towards complete closure. 11/6; this is a wound  on the right TMA tip at the level roughly of the fourth metatarsal. Small wound with 0.5 cm of depth we have been using endoform ABDs and gauze. 11/13; right TMA at roughly the fourth metatarsal. The surface callus raised the possibility that there was healing a healed state here. We have been using endoform ABDs and gauze. Objective Constitutional Sitting or standing Blood Pressure is within target range for patient.. Pulse regular and within target range for patient.Marland Kitchen Respirations regular, non-labored and within target range.. Temperature is normal and within the target range for the patient.Marland Kitchen Appears in no distress. Vitals Time Taken: 11:35 AM, Height: 80 in, Weight: 280 lbs, BMI: 30.8, Temperature:  97.6 F, Pulse: 56 bpm, Respiratory Rate: 18 breaths/min, Blood Pressure: 138/73 mmHg. General Notes: Wound exam; punched-out area over the right lateral TMA at roughly the fourth met head. The etiology is not clear. Roughly 0.5 cm in depth but some undermining. I used a #3 curette to remove the surface eschar, this was not healed underneath this. About no change since last week. No purulence however, no evidence of soft tissue infection Integumentary (Hair, Skin) Wound #5 status is Open. Original cause of wound was Gradually Appeared. The date acquired was: 04/19/2023. The wound has been in treatment 11 weeks. 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 The wound measures 0.3cm length x 0.3cm width x 0.4cm depth; 0.071cm^2 area and 0.028cm^3 volume. There is Fat Layer (Subcutaneous Tissue) exposed. There is no tunneling noted, however, there is undermining starting at 12:00 and ending at 1:00 with a maximum distance of 0.4cm. There is a medium amount of serosanguineous drainage noted. The wound margin is distinct with the outline attached to the wound base. There is small (1-33%) pink granulation within the wound bed. There is a large (67-100%) amount of necrotic tissue within the wound bed including Eschar and Adherent Slough. The periwound skin appearance had no abnormalities noted for color. The periwound skin appearance exhibited: Callus, Scarring, Dry/Scaly. Periwound temperature was noted as No Abnormality. Assessment Active Problems ICD-10 Type 2 diabetes mellitus with foot ulcer Non-pressure chronic ulcer of other part of right foot with other specified severity Type 2 diabetes mellitus with diabetic peripheral angiopathy without gangrene Type 2 diabetes mellitus with diabetic polyneuropathy Procedures Wound #5 Pre-procedure diagnosis of Wound #5 is a Diabetic Wound/Ulcer of the Lower Extremity located on the Right Amputation Site - Transmetatarsal .Severity  of Tissue Pre Debridement is: Fat layer exposed. There was a Excisional Skin/Subcutaneous Tissue Debridement with a total area of 0.07 sq cm performed by Maxwell Caul., MD. With the following instrument(s): Curette to remove Viable and Non-Viable tissue/material. Material removed includes Callus, Subcutaneous Tissue, Skin: Dermis, and Skin: Epidermis after achieving pain control using Lidocaine 4% T opical Solution. No specimens were taken. A time out was conducted at 11:45, prior to the start of the procedure. A Minimum amount of bleeding was controlled with Pressure. The procedure was tolerated well. Post Debridement Measurements: 0.3cm length x 0.3cm width x 0.4cm depth; 0.028cm^3 volume. Character of Wound/Ulcer Post Debridement is improved. Severity of Tissue Post Debridement is: Fat layer exposed. Post procedure Diagnosis Wound #5: Same as Pre-Procedure Plan Follow-up Appointments: GAUTAM, REYNOLDSON ([id number]) [id number] Page 7 of 8 Return Appointment in 1 week. - Dr. Emeline Darling (Room 9) 10/05/23 at 11:15am Return Appointment in 2 weeks. - Dr. Bernie Covey week-can skip this week appointment wise. Return appointment in 3 weeks. - Dr. Leanord Hawking Please schedule an appointment with  front desk Anesthetic: (In clinic) Topical Lidocaine 4% applied to wound bed - Used in Clinic Bathing/ Shower/ Hygiene: May shower and wash wound with soap and water. - After shower, may change wound dressing if so desired Home Health: Wound #5 Right Amputation Site - Transmetatarsal: Discontinue home health for wound care. - Discontinue Home Health Other Home Health Orders/Instructions: - Wound Care by [facility] at least twice a week.-D/C'd due to no insurance authorization (09/28/23) WOUND #5: - Amputation Site - Transmetatarsal Wound Laterality: Right Cleanser: Normal Saline (DME) (Generic) 3 x Per Week/30 Days Discharge Instructions: use to slightly  moisten Endoform before placing in the wound bed Cleanser: Soap and Water 3 x Per Week/30 Days Discharge Instructions: May shower and wash wound with dial antibacterial soap and water prior to dressing change. Cleanser: Vashe 5.8 (oz) 3 x Per Week/30 Days Discharge Instructions: Cleanse the wound with Vashe prior to applying a clean dressing using gauze sponges, not tissue or cotton balls. Cleanser: Wound Cleanser 3 x Per Week/30 Days Discharge Instructions: Cleanse the wound with wound cleanser prior to applying a clean dressing using gauze sponges, not tissue or cotton balls. Prim Dressing: Endoform 2x2 in (DME) (Generic) 3 x Per Week/30 Days ary Discharge Instructions: Moisten with saline Secondary Dressing: ABD Pad, 5x9 (DME) (Generic) 3 x Per Week/30 Days Discharge Instructions: Apply over primary dressing as directed. Secondary Dressing: Woven Gauze Sponge, Non-Sterile 4x4 in (DME) (Generic) 3 x Per Week/30 Days Discharge Instructions: Apply over primary dressing as directed. Secured With: [employer], 4.5x3.1 (in/yd) (DME) (Generic) 3 x Per Week/30 Days Discharge Instructions: Secure with Kerlix as directed. Secured With: Paper T ape, 2x10 (in/yd) (DME) (Generic) 3 x Per Week/30 Days Discharge Instructions: Secure dressing with tape as directed. 1. Still used endoform this week 2. If there is no improvement by next week may try Iodosorb in this area 3. No evidence of infection this does not probe to bone Electronic Signature(s) Signed: 09/30/2023 1:18:47 PM By: Shawn Stall RN, BSN Signed: 10/03/2023 4:31:26 PM By: Baltazar Najjar MD Previous Signature: 09/29/2023 4:06:32 PM Version By: Baltazar Najjar MD Entered By: Shawn Stall on 09/30/2023 13:03:58 -------------------------------------------------------------------------------- SuperBill Details Patient Name: Date of Service: [name] 09/28/2023 Medical Record Number: [id number] Patient Account Number:  [id number] Date of Birth/Sex: Treating RN: 1940/12/17 (82 y.o. M) Primary Care Provider: [name] Other Clinician: Referring Provider: Treating Provider/Extender: Erasmo Downer in Treatment: 11 Diagnosis Coding ICD-10 Codes Code Description E11.621 Type 2 diabetes mellitus with foot ulcer L97.518 Non-pressure chronic ulcer of other part of right foot with other specified severity E11.51 Type 2 diabetes mellitus with diabetic peripheral angiopathy without gangrene E11.42 Type 2 diabetes mellitus with diabetic polyneuropathy Facility Procedures : CPT4 Code: 13244010 Description: 11042 - DEB SUBQ TISSUE 20 SQ CM/< ICD-10 Diagnosis Description E11.621 Type 2 diabetes mellitus with foot ulcer L97.518 Non-pressure chronic ulcer of other part of right foot with other specified sev Modifier: erity Quantity: 1 Physician Procedures CHAZZ, FOK ([id number]): CPT4 Code Description [id number] 11042 - WC PHYS SUBQ TISS 20 SQ CM ICD-10 Diagnosis Description E11.621 Type 2 diabetes mellitus with foot ulcer L97.518 Non-pressure chronic ulcer of other part of right foot with other s [id number] Page 8 of 8: Quantity Modifier 1 pecified severity Electronic Signature(s) Signed: 09/29/2023 4:06:32 PM By: Baltazar Najjar MD Entered By: Baltazar Najjar on 09/28/2023 12:12:07

## 2023-09-29 NOTE — Progress Notes (Signed)
Jason [name] ([id number]) [id number] Page 1 of 7 Visit Report for 09/28/2023 Arrival Information Details Patient Name: Date of Service: Jason [name], Jason [name] 09/28/2023 11:15 A M Medical Record Number: [id number] Patient Account Number: [id number] Date of Birth/Sex: Treating RN: 03/27/1941 (82 y.o. M) Primary Care Jason [name]: Jason [name] Other Clinician: Referring Jason [name]: Treating Jason [name]/Extender: Jason [name] in Treatment: 11 Visit Information History Since Last Visit Added or deleted any medications: No Patient Arrived: Jason [name] Any new allergies or adverse reactions: No Arrival Time: 11:22 Had a fall or experienced change in No Accompanied By: wife activities of daily living that may affect Transfer Assistance: None risk of falls: Patient Identification Verified: Yes Signs or symptoms of abuse/neglect since last visito No Secondary Verification Process Completed: Yes Hospitalized since last visit: No Patient Has Alerts: Yes Implantable device outside of the clinic excluding No Patient Alerts: ABI R 1.46 (03/29/23) cellular tissue based products placed in the center ABI L 1.63 (03/29/23) since last visit: Has Dressing in Place as Prescribed: Yes Pain Present Now: No Electronic Signature(s) Signed: 09/28/2023 4:47:30 PM By: Thayer Dallas Entered By: Thayer Dallas on 09/28/2023 11:28:08 -------------------------------------------------------------------------------- Encounter Discharge Information Details Patient Name: Date of Service: Jason Milian S. 09/28/2023 11:15 A M Medical Record Number: [id number] Patient Account Number: [id number] Date of Birth/Sex: Treating RN: 09-Jan-1941 (82 y.o. Jason [name] Primary Care Jason [name]: Jason [name] Other Clinician: Referring Cambren Helm: Treating Jason [name]/Extender: Jason [name] in Treatment: 11 Encounter Discharge Information Items Post Procedure Vitals Discharge  Condition: Stable Temperature (F): 97.6 Ambulatory Status: Walker Pulse (bpm): 56 Discharge Destination: Home Respiratory Rate (breaths/min): 18 Transportation: Private Auto Blood Pressure (mmHg): 138/73 Accompanied By: caregiver/friend/spouse Schedule Follow-up Appointment: Yes Clinical Summary of Care: Patient Declined Electronic Signature(s) Signed: 09/28/2023 5:11:37 PM By: Karie Schwalbe RN Entered By: Karie Schwalbe on 09/28/2023 12:37:59 [name], Jason Olive ([id number]) [id number] Page 2 of 7 -------------------------------------------------------------------------------- Lower Extremity Assessment Details Patient Name: Date of Service: Jason [name] 09/28/2023 11:15 A M Medical Record Number: [id number] Patient Account Number: [id number] Date of Birth/Sex: Treating RN: 29-Nov-1940 (82 y.o. M) Primary Care Jason [name]: Jason [name] Other Clinician: Referring Jason [name]: Treating Jason [name]/Extender: Jason [name] in Treatment: 11 Edema Assessment Assessed: Kyra Searles: No] Franne Forts: No] Edema: [Left: N] [Right: o] Calf Left: Right: Point of Measurement: 38 cm From Medial Instep 41.5 cm Ankle Left: Right: Point of Measurement: 11 cm From Medial Instep 27 cm Vascular Assessment Extremity colors, hair growth, and conditions: Extremity Color: [Right:Pale] Hair Growth on Extremity: [Right:No] Temperature of Extremity: [Right:Warm] Dependent Rubor: [Right:No No] Electronic Signature(s) Signed: 09/28/2023 4:47:30 PM By: Thayer Dallas Entered By: Thayer Dallas on 09/28/2023 11:35:01 -------------------------------------------------------------------------------- Multi Wound Chart Details Patient Name: Date of Service: Jason Milian S. 09/28/2023 11:15 A M Medical Record Number: [id number] Patient Account Number: [id number] Date of Birth/Sex: Treating RN: Oct 21, 1941 (82 y.o. M) Primary Care Jason [name]: Jason [name] Other  Clinician: Referring Jason [name]: Treating Jason [name]/Extender: Jason [name] in Treatment: 11 Vital Signs Height(in): 80 Pulse(bpm): 56 Weight(lbs): 280 Blood Pressure(mmHg): 138/73 Body Mass Index(BMI): 30.8 Temperature(F): 97.6 Respiratory Rate(breaths/min): 18 [5:Photos:] [N/A:N/A] Right Amputation Site - N/A N/A Wound Location: Transmetatarsal Gradually Appeared N/A N/A Wounding Event: Diabetic Wound/Ulcer of the Lower N/A N/A Primary Etiology: Extremity Sleep Apnea, Arrhythmia, Congestive N/A N/A Comorbid History: Heart Failure, Peripheral Arterial Disease, Peripheral Venous Disease, Type II Diabetes, Neuropathy 04/19/2023 N/A N/A Date Acquired: 11 N/A N/A Weeks of Treatment: Open N/A N/A Wound Status:  No N/A N/A Wound Recurrence: 0.3x0.3x0.4 N/A N/A Measurements L x W x D (cm) 0.071 N/A N/A A (cm) : rea 0.028 N/A N/A Volume (cm) : 97.90% N/A N/A % Reduction in A rea: 97.90% N/A N/A % Reduction in Volume: 12 Starting Position 1 (o'clock): 1 Ending Position 1 (o'clock): 0.4 Maximum Distance 1 (cm): Yes N/A N/A Undermining: Grade 2 N/A N/A Classification: Medium N/A N/A Exudate A mount: Serosanguineous N/A N/A Exudate Type: red, brown N/A N/A Exudate Color: Distinct, outline attached N/A N/A Wound Margin: Small (1-33%) N/A N/A Granulation A mount: Pink N/A N/A Granulation Quality: Large (67-100%) N/A N/A Necrotic A mount: Eschar, Adherent Slough N/A N/A Necrotic Tissue: Fat Layer (Subcutaneous Tissue): Yes N/A N/A Exposed Structures: Fascia: No Tendon: No Muscle: No Joint: No Bone: No None N/A N/A Epithelialization: Debridement - Excisional N/A N/A Debridement: Pre-procedure Verification/Time Out 11:45 N/A N/A Taken: Lidocaine 4% Topical Solution N/A N/A Pain Control: Callus, Subcutaneous N/A N/A Tissue Debrided: Skin/Subcutaneous Tissue N/A N/A Level: 0.07 N/A N/A Debridement A (sq cm): rea Curette N/A  N/A Instrument: Minimum N/A N/A Bleeding: Pressure N/A N/A Hemostasis A chieved: Procedure was tolerated well N/A N/A Debridement Treatment Response: 0.3x0.3x0.4 N/A N/A Post Debridement Measurements L x W x D (cm) 0.028 N/A N/A Post Debridement Volume: (cm) Callus: Yes N/A N/A Periwound Skin Texture: Scarring: Yes Dry/Scaly: Yes N/A N/A Periwound Skin Moisture: No Abnormalities Noted N/A N/A Periwound Skin Color: No Abnormality N/A N/A Temperature: Debridement N/A N/A Procedures Performed: Treatment Notes Electronic Signature(s) Signed: 09/29/2023 4:06:32 PM By: Baltazar Najjar MD Entered By: Baltazar Najjar on 09/28/2023 12:07:34 -------------------------------------------------------------------------------- Multi-Disciplinary Care Plan Details Patient Name: Date of Service: Jason Milian S. 09/28/2023 11:15 A M Medical Record Number: [id number] Patient Account Number: [id number] Date of Birth/Sex: Treating RN: [birth year] (82 y.o. Amine, [place], [place] ([id number]) [id number] Page 4 of 7 Primary Care Tricia Pledger: Jason [name] Other Clinician: Referring Dashiell Franchino: Treating Breyden Jeudy/Extender: Jason [name] in Treatment: 11 Active Inactive Wound/Skin Impairment Nursing Diagnoses: Impaired tissue integrity Goals: Patient/caregiver will verbalize understanding of skin care regimen Date Initiated: 07/13/2023 Target Resolution Date: 11/14/2023 Goal Status: Active Interventions: Assess ulceration(s) every visit Treatment Activities: Skin care regimen initiated : 07/13/2023 Notes: Electronic Signature(s) Signed: 09/28/2023 5:11:37 PM By: Karie Schwalbe RN Entered By: Karie Schwalbe on 09/28/2023 12:34:49 -------------------------------------------------------------------------------- Pain Assessment Details Patient Name: Date of Service: Jason Milian S. 09/28/2023 11:15 A M Medical Record Number:  [id number] Patient Account Number: [id number] Date of Birth/Sex: Treating RN: 1941-07-13 (82 y.o. M) Primary Care Jacquetta Polhamus: Jason [name] Other Clinician: Referring Shishir Krantz: Treating Soundra Lampley/Extender: Jason [name] in Treatment: 11 Active Problems Location of Pain Severity and Description of Pain Patient Has Paino No Site Locations Pain Management and Medication Current Pain Management: Electronic Signature(s) GARDY, LETIZIA ([id number]) [id number] Page 5 of 7 Signed: 09/28/2023 4:47:30 PM By: Thayer Dallas Entered By: Thayer Dallas on 09/28/2023 11:28:26 -------------------------------------------------------------------------------- Patient/Caregiver Education Details Patient Name: Date of Service: [name] 11/13/2024andnbsp11:15 A M Medical Record Number: [id number] Patient Account Number: [id number] Date of Birth/Gender: Treating RN: [birth year] (82 y.o. Jason [name] Primary Care Physician: Jason [name] Other Clinician: Referring Physician: Treating Physician/Extender: Jason [name] in Treatment: 11 Education Assessment Education Provided To: Patient Education Topics Provided Wound/Skin Impairment: Methods: Explain/Verbal Responses: State content correctly Electronic Signature(s) Signed: 09/28/2023 5:11:37 PM By: Karie Schwalbe RN Entered By: Karie Schwalbe on 09/28/2023 12:35:01 -------------------------------------------------------------------------------- Wound Assessment Details Patient Name: Date  of Service: DEONTAI, VITALE 09/28/2023 11:15 A M Medical Record Number: [id number] Patient Account Number: [id number] Date of Birth/Sex: Treating RN: 01-30-1941 (82 y.o. M) Primary Care Anthea Udovich: Jason [name] Other Clinician: Referring Amrita Radu: Treating Tashi Band/Extender: Jason [name] in Treatment: 11 Wound Status Wound Number: 5 Primary Diabetic Wound/Ulcer of the  Lower Extremity Etiology: Wound Location: Right Amputation Site - Transmetatarsal Wound Open Wounding Event: Gradually Appeared Status: Date Acquired: 04/19/2023 Comorbid Sleep Apnea, Arrhythmia, Congestive Heart Failure, Peripheral Weeks Of Treatment: 11 History: Arterial Disease, Peripheral Venous Disease, Type II Diabetes, Clustered Wound: No Neuropathy Photos KUTTER, PARROTT ([id number]) [id number] Page 6 of 7 Wound Measurements Length: (cm) 0.3 Width: (cm) 0.3 Depth: (cm) 0.4 Area: (cm) 0.071 Volume: (cm) 0.028 % Reduction in Area: 97.9% % Reduction in Volume: 97.9% Epithelialization: None Tunneling: No Undermining: Yes Starting Position (o'clock): 12 Ending Position (o'clock): 1 Maximum Distance: (cm) 0.4 Wound Description Classification: Grade 2 Wound Margin: Distinct, outline attached Exudate Amount: Medium Exudate Type: Serosanguineous Exudate Color: red, brown Foul Odor After Cleansing: No Slough/Fibrino Yes Wound Bed Granulation Amount: Small (1-33%) Exposed Structure Granulation Quality: Pink Fascia Exposed: No Necrotic Amount: Large (67-100%) Fat Layer (Subcutaneous Tissue) Exposed: Yes Necrotic Quality: Eschar, Adherent Slough Tendon Exposed: No Muscle Exposed: No Joint Exposed: No Bone Exposed: No Periwound Skin Texture Texture Color No Abnormalities Noted: No No Abnormalities Noted: Yes Callus: Yes Temperature / Pain Scarring: Yes Temperature: No Abnormality Moisture No Abnormalities Noted: No Dry / Scaly: Yes Treatment Notes Wound #5 (Amputation Site - Transmetatarsal) Wound Laterality: Right Cleanser Normal Saline Discharge Instruction: use to slightly moisten Endoform before placing in the wound bed Soap and Water Discharge Instruction: May shower and wash wound with dial antibacterial soap and water prior to dressing change. Vashe 5.8 (oz) Discharge Instruction: Cleanse the wound with Vashe prior to applying  a clean dressing using gauze sponges, not tissue or cotton balls. Wound Cleanser Discharge Instruction: Cleanse the wound with wound cleanser prior to applying a clean dressing using gauze sponges, not tissue or cotton balls. Peri-Wound Care Topical Primary Dressing Endoform 2x2 in Discharge Instruction: Moisten with saline Secondary Dressing ABD Pad, 5x9 SHONTE, KUMP ([id number]) [id number] Page 7 of 7 Discharge Instruction: Apply over primary dressing as directed. Woven Gauze Sponge, Non-Sterile 4x4 in Discharge Instruction: Apply over primary dressing as directed. Secured With [employer], 4.5x3.1 (in/yd) Discharge Instruction: Secure with Kerlix as directed. Paper Tape, 2x10 (in/yd) Discharge Instruction: Secure dressing with tape as directed. Compression Wrap Compression Stockings Add-Ons Electronic Signature(s) Signed: 09/28/2023 5:11:37 PM By: Karie Schwalbe RN Entered By: Karie Schwalbe on 09/28/2023 11:55:40 -------------------------------------------------------------------------------- Vitals Details Patient Name: Date of Service: Jason Milian S. 09/28/2023 11:15 A M Medical Record Number: [id number] Patient Account Number: [id number] Date of Birth/Sex: Treating RN: [birth year] (82 y.o. M) Primary Care Toneka Fullen: Jason [name] Other Clinician: Referring Shrihan Putt: Treating Myrakle Wingler/Extender: Jason [name] in Treatment: 11 Vital Signs Time Taken: 11:35 Temperature (F): 97.6 Height (in): 80 Pulse (bpm): 56 Weight (lbs): 280 Respiratory Rate (breaths/min): 18 Body Mass Index (BMI): 30.8 Blood Pressure (mmHg): 138/73 Reference Range: 80 - 120 mg / dl Electronic Signature(s) Signed: 09/28/2023 4:47:30 PM By: Thayer Dallas Entered By: Thayer Dallas on 09/28/2023 11:36:13

## 2023-10-05 ENCOUNTER — Encounter (HOSPITAL_BASED_OUTPATIENT_CLINIC_OR_DEPARTMENT_OTHER): Payer: Medicare HMO | Admitting: Internal Medicine

## 2023-10-05 DIAGNOSIS — E11621 Type 2 diabetes mellitus with foot ulcer: Secondary | ICD-10-CM | POA: Diagnosis not present

## 2023-10-05 NOTE — Progress Notes (Signed)
[name], [name] ([id number]) [id number] Page 1 of 8 Visit Report for 10/05/2023 Debridement Details Patient Name: Date of Service: Jason [name], Jason [name] 10/05/2023 11:15 A M Medical Record Number: [id number] Patient Account Number: [id number] Date of Birth/Sex: Treating RN: [birth year] (82 y.o. M) Primary Care Provider: [name] Other Clinician: Referring Provider: Treating Provider/Extender: Erasmo Downer in Treatment: 12 Debridement Performed for Assessment: Wound #5 Right Amputation Site - Transmetatarsal Performed By: Physician Maxwell Caul., MD The following information was scribed by: Karie Schwalbe The information was scribed for: Jason [name] Debridement Type: Debridement Severity of Tissue Pre Debridement: Fat layer exposed Level of Consciousness (Pre-procedure): Awake and Alert Pre-procedure Verification/Time Out Yes - 12:21 Taken: Start Time: 12:21 Pain Control: Lidocaine 5% topical ointment Percent of Wound Bed Debrided: 100% T Area Debrided (cm): otal 0.07 Tissue and other material debrided: Viable, Non-Viable, Callus, Eschar, Skin: Dermis , Skin: Epidermis Level: Skin/Epidermis Debridement Description: Selective/Open Wound Instrument: Curette Bleeding: Minimum Hemostasis Achieved: Pressure Response to Treatment: Procedure was tolerated well Level of Consciousness (Post- Awake and Alert procedure): Post Debridement Measurements of Total Wound Length: (cm) 0.3 Width: (cm) 0.3 Depth: (cm) 0.3 Volume: (cm) 0.021 Character of Wound/Ulcer Post Debridement: Improved Severity of Tissue Post Debridement: Fat layer exposed Post Procedure Diagnosis Same as Pre-procedure Electronic Signature(s) Signed: 10/05/2023 4:37:54 PM By: Jason Najjar MD Entered By: Jason [name] on 10/05/2023 09:32:31 -------------------------------------------------------------------------------- HPI Details Patient Name: Date of  Service: Jason Milian S. 10/05/2023 11:15 A M Medical Record Number: [id number] Patient Account Number: [id number] Date of Birth/Sex: Treating RN: 1941-07-20 (82 y.o. M) Primary Care Provider: [name] Other Clinician: Referring Provider: Treating Provider/Extender: Erasmo Downer in Treatment: 12 History of Present Illness JAQUAVEON, VANOS ([id number]) [id number] Page 2 of 8 Chronic/Inactive Conditions Condition 1: He was followed for a period of time by Dr. Durwin Nora of vein and vascular as far as I can see last saw him on 1//24. 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 Last arterial studies I see showed noncompressible ABIs bilaterally but multiphasic waveforms on the right but a TBI of 0.44 on the left HPI Description: 01/31/17; this is a 82 year old man who has a history of type 2 diabetes on oral agents. He has a history of wound difficulties however this is on his right foot in fact he has had amputations of his medial 3 toes. I did not actually look at the right foot today. He tells me about a month ago he was cutting his mycotic nails informed a wound on the medial aspect of the left great toe. He saw Dr. Logan Bores of podiatry. He has been using Neosporin for a while and now simply wrapping it with gauze. He states he does have peripheral neuropathy. His hemoglobin A1c in this clinic was 1. 02/21/1989 non-compressible on the left. He did have vascular studies done in 2015 showing ABIs of 1.4 bilaterally, TBI's of 1.0 on the right 1.1 on the left with triphasic waveforms. He has not had imaging studies that I'm aware of 02/07/17; this is a 82 year old man with type 2 diabetes on oral agents. [place] with a small open area on the medial aspect of his left great toe. This is in the setting of a  significant mycotic nail for which he follows with Dr. Logan Bores of podiatry  READMISSION 07/13/2023 This is a now 82 year old man who is had problems with a wound on his right foot amputation site after undergoing a transmetatarsal amputation in May of this year. He was admitted to hospital from 03/29/2023 through 04/02/2023 with sepsis felt to be secondary to an infected wound I think on the right first metatarsal head he underwent a transmetatarsal amputation by podiatry in the hospital. A culture of the wound had previously shown Klebsiella. He was seen in follow-up in the office on 04/19/2023 with a postop wound infection and was given doxycycline and Augmentin for 14 days. He has been dressing the wounds with Betadine wet-to-dry. Looking through epic it would appear that the follow-up with the podiatry group has been sporadic. The patient tells me he was at [facility] skilled facility although he is back at home now. His wound now is actually probably between the fourth and fifth metatarsal head on the amputation site. He has a smaller wound on the dorsal foot in this area and I am sure at 1 time these connected but I could not prove this today. He just came in with kerlix and dry gauze on the wound. He was followed for a period of time by Dr. Durwin Nora of vein and vascular as far as I can see last saw him on 1//24. 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 Last arterial studies I see showed noncompressible ABIs bilaterally but multiphasic waveforms on the right but a TBI of 0.44 on the left Past medical history includes type 2 diabetes with peripheral neuropathy and PAD, diastolic heart failure, mitral valve stenosis, atrial fibrillation on Xarelto, sleep apnea ABIs were not repeated today in the clinic as they are previously noncompressible as noted His MRI is listed below;  This did not suggest osteomyelitis in the area of the right foot where his wound is currently located MRI 03/29/23 IMPRESSION: 1. Deep skin wound about the plantar aspect of the first metatarsal head with mild marrow edema about the plantar aspect of the first metatarsal head concerning for osteomyelitis. 2. No fluid collection or abscess. 3. Postsurgical changes with prior amputation of the right foot through the metatarsophalangeal joints of the first and fifth digit and through metatarsal heads of the second through fourth digits. 4. Edema of the plantar muscles and tendons suggesting diabetic myopathy/myositis. 07-20-2023 upon evaluation today patient presents for follow-up evaluation here in the clinic. I did review his note from the last visit with Dr. Leanord Hawking. With that being said he is a current patient of vein and vascular here in [place] and has seen them last it looks like according to Dr. Jannetta Quint note in January of this year. At that point he had multiphasic waveforms on the right with a TBI of 0.44 on the left. I do not see any recent x-rays currently although he has seen podiatry I cannot see any of those x-rays. I really think having him go for a x-ray of the foot would be a good idea. 07-27-2023 upon evaluation today patient appears to be doing well currently in regard to his wound. He is actually showing signs of good improvement. I actually feel like this is significantly improved compared to last week's evaluation he does seem to have good granulation and epithelization and very pleased there is some callus I am going to need to remove however. 08-10-2023 upon evaluation today patient  appears to be doing well currently in regard to his wound. The foot actually appears to be showing signs of excellent improvement and I am very pleased in that regard. Fortunately I do not see any evidence of worsening overall and I do believe that the patient is making good headway towards  closure. For now I am probably going to recommend that we continue with the [facility] although we may make a switch to collagen in the near future depending on how things progress. 08-24-2023 upon evaluation today patient appears to be doing well currently in regard to his wound. He has been tolerating the dressing changes I do believe it may be good to switch over to endoform at this point he is in agreement with that plan. Fortunately I do not see any evidence of infection or worsening overall and I am pleased in that regard. 08-31-2023 upon evaluation today patient's wound bed actually showed signs of good granulation epithelization at this point. Fortunately I do not see any signs of infection he seems to be doing quite well based on what I am seeing today. 09-07-2023 upon evaluation today patient appears to be doing well currently in regard to his wound. Has been tolerating the dressing changes without complication. Fortunately I do not see any signs of active infection locally or systemically at this time which is great news. No fevers, chills, nausea, vomiting, or diarrhea. 09-14-2023 upon evaluation today patient appears to be doing excellent in regard to his foot ulcer. This is getting very close to complete resolution. Fortunately I do not see any evidence of worsening overall and I do believe that the patient is making excellent headway towards complete closure. 11/6; this is a wound on the right TMA tip at the level roughly of the fourth metatarsal. Small wound with 0.5 cm of depth we have been using endoform ABDs and gauze. 11/13; right TMA at roughly the fourth metatarsal. The surface callus raised the possibility that there was healing a healed state here. We have been using endoform ABDs and gauze. 11/20; right TMA at roughly the fourth metatarsal again comes in with surface callus. This requires debridement we have been using endoform ABDs and gauze this is being changed daily  by a neighbor who has medical training Electronic Signature(s) Signed: 10/05/2023 4:37:54 PM By: Jason Najjar MD [name] ([id number]) PM By: Jason Najjar MD [phone].pdf Page 3 of 8 Signed: 10/05/2023 4:37:54 Entered By: Jason [name] on 10/05/2023 09:34:35 -------------------------------------------------------------------------------- Physical Exam Details Patient Name: Date of Service: AAIDAN, RENDER 10/05/2023 11:15 A M Medical Record Number: [id number] Patient Account Number: [id number] Date of Birth/Sex: Treating RN: February 03, 1941 (82 y.o. M) Primary Care Provider: [name] Other Clinician: Referring Provider: Treating Provider/Extender: Erasmo Downer in Treatment: 12 Constitutional Sitting or standing Blood Pressure is within target range for patient.. Pulse regular and within target range for patient.Marland Kitchen Respirations regular, non-labored and within target range.. Temperature is normal and within the target range for the patient.Marland Kitchen Appears in no distress. Notes Wound exam punched-out area of the right lateral TMA at roughly the fourth metatarsal head. Still callus around the wound edge with undermining. I used a #3 curette to remove the skin callus to expose the wound which still has some depth. There is no evidence of infection Electronic Signature(s) Signed: 10/05/2023 4:37:54 PM By: Jason Najjar MD Entered By: Jason [name] on 10/05/2023 09:36:39 -------------------------------------------------------------------------------- Physician Orders Details Patient Name: Date of Service: A MO S, Demontray S.  10/05/2023 11:15 A M Medical Record Number: [id number] Patient Account Number: [id number] Date of Birth/Sex: Treating RN: Dec 24, 1940 (82 y.o. [name] Primary Care Provider: [name] Other Clinician: Referring Provider: Treating Provider/Extender: Erasmo Downer in Treatment:  12 Verbal / Phone Orders: No Diagnosis Coding Follow-up Appointments ppointment in 1 week. - Dr. Leanord Hawking [facility] 10/19/23 at 11am Return A ppointment in 2 weeks. - Dr. Bernie Covey week-can skip this week appointment wise. Return A Return appointment in 3 weeks. - Dr. Leanord Hawking Please schedule an appointment with front desk Anesthetic (In clinic) Topical Lidocaine 4% applied to wound bed - Used in Clinic Bathing/ Shower/ Hygiene May shower and wash wound with soap and water. - Please keep the wound dressing dry on the days the wound dressing is not being changed Home Health Wound #5 Right Amputation Site - Transmetatarsal Discontinue home health for wound care. - Discontinue Home Health Other Home Health Orders/Instructions: - Wound Care by [facility] at least twice a week.-D/C'd due to no insurance authorization (09/28/23) Wound Treatment Wound #5 - Amputation Site - Transmetatarsal Wound Laterality: Right Cleanser: Normal Saline (Generic) Every Other Day/30 Days Discharge Instructions: use to slightly moisten Endoform before placing in the wound bed Cleanser: Soap and Water Every Other Day/30 Days Discharge Instructions: May shower and wash wound with dial antibacterial soap and water prior to dressing change. JAECEON, SMETHURST ([id number]) [id number] Page 4 of 8 Cleanser: Vashe 5.8 (oz) Every Other Day/30 Days Discharge Instructions: Cleanse the wound with Vashe prior to applying a clean dressing using gauze sponges, not tissue or cotton balls. Cleanser: Wound Cleanser Every Other Day/30 Days Discharge Instructions: Cleanse the wound with wound cleanser prior to applying a clean dressing using gauze sponges, not tissue or cotton balls. Prim Dressing: Endoform 2x2 in (Generic) Every Other Day/30 Days ary Discharge Instructions: Moisten with saline Secondary Dressing: ABD Pad, 5x9 (Generic) Every Other Day/30 Days Discharge Instructions:  Apply over primary dressing as directed. Secondary Dressing: Woven Gauze Sponge, Non-Sterile 4x4 in (Generic) Every Other Day/30 Days Discharge Instructions: Apply over primary dressing as directed. Secured With: [employer], 4.5x3.1 (in/yd) (Generic) Every Other Day/30 Days Discharge Instructions: Secure with Kerlix as directed. Secured With: Paper Tape, 2x10 (in/yd) (Generic) Every Other Day/30 Days Discharge Instructions: Secure dressing with tape as directed. Electronic Signature(s) Signed: 10/05/2023 1:14:50 PM By: Karie Schwalbe RN Signed: 10/05/2023 4:37:54 PM By: Jason Najjar MD Entered By: Karie Schwalbe on 10/05/2023 09:29:17 -------------------------------------------------------------------------------- Problem List Details Patient Name: Date of Service: [name] 10/05/2023 11:15 A M Medical Record Number: [id number] Patient Account Number: [id number] Date of Birth/Sex: Treating RN: 05/11/1941 (82 y.o. M) Primary Care Provider: [name] Other Clinician: Referring Provider: Treating Provider/Extender: Erasmo Downer in Treatment: 12 Active Problems ICD-10 Encounter Code Description Active Date MDM Diagnosis E11.621 Type 2 diabetes mellitus with foot ulcer 07/13/2023 No Yes L97.518 Non-pressure chronic ulcer of other part of right foot with other specified 07/13/2023 No Yes severity E11.51 Type 2 diabetes mellitus with diabetic peripheral angiopathy without gangrene 07/13/2023 No Yes E11.42 Type 2 diabetes mellitus with diabetic polyneuropathy 07/13/2023 No Yes Inactive Problems Resolved Problems Electronic Signature(s) DEADRICK, VERDUGO ([id number]) [phone].pdf Page 5 of 8 Signed: 10/05/2023 4:37:54 PM By: Jason Najjar MD Entered By: Jason [name] on 10/05/2023 09:30:22 -------------------------------------------------------------------------------- Progress Note Details Patient Name: Date of  Service: Jason Milian S. 10/05/2023 11:15 A M Medical Record Number: [id number] Patient Account Number: [id number] Date of  Birth/Sex: Treating RN: February 08, 1941 (82 y.o. M) Primary Care Provider: [name] Other Clinician: Referring Provider: Treating Provider/Extender: Erasmo Downer in Treatment: 12 Subjective History of Present Illness (HPI) Chronic/Inactive Condition: He was followed for a period of time by Dr. Durwin Nora of vein and vascular as far as I can see last saw him on 1//24. 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 Last arterial studies I see showed noncompressible ABIs bilaterally but multiphasic waveforms on the right but a TBI of 0.44 on the left 01/31/17; this is a 82 year old man who has a history of type 2 diabetes on oral agents. He has a history of wound difficulties however this is on his right foot in fact he has had amputations of his medial 3 toes. I did not actually look at the right foot today. He tells me about a month ago he was cutting his mycotic nails informed a wound on the medial aspect of the left great toe. He saw Dr. Logan Bores of podiatry. He has been using Neosporin for a while and now simply wrapping it with gauze. He states he does have peripheral neuropathy. His hemoglobin A1c in this clinic was 1. 02/21/1989 non-compressible on the left. He did have vascular studies done in 2015 showing ABIs of 1.4 bilaterally, TBI's of 1.0 on the right 1.1 on the left with triphasic waveforms. He has not had imaging studies that I'm aware of 02/07/17; this is a 82 year old man with type 2 diabetes on oral agents. [place] with a small open area on the medial aspect of his left great toe. This is in the setting of a significant mycotic nail for which he follows with Dr. Logan Bores of  podiatry READMISSION 07/13/2023 This is a now 82 year old man who is had problems with a wound on his right foot amputation site after undergoing a transmetatarsal amputation in May of this year. He was admitted to hospital from 03/29/2023 through 04/02/2023 with sepsis felt to be secondary to an infected wound I think on the right first metatarsal head he underwent a transmetatarsal amputation by podiatry in the hospital. A culture of the wound had previously shown Klebsiella. He was seen in follow-up in the office on 04/19/2023 with a postop wound infection and was given doxycycline and Augmentin for 14 days. He has been dressing the wounds with Betadine wet-to-dry. Looking through epic it would appear that the follow-up with the podiatry group has been sporadic. The patient tells me he was at [facility] skilled facility although he is back at home now. His wound now is actually probably between the fourth and fifth metatarsal head on the amputation site. He has a smaller wound on the dorsal foot in this area and I am sure at 1 time these connected but I could not prove this today. He just came in with kerlix and dry gauze on the wound. He was followed for a period of time by Dr. Durwin Nora of vein and vascular as far as I can see last saw him on 1//24. 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 Last arterial studies I see showed noncompressible ABIs bilaterally but multiphasic waveforms on the right but a TBI of 0.44 on the left Past medical  history includes type 2 diabetes with peripheral neuropathy and PAD, diastolic heart failure, mitral valve stenosis, atrial fibrillation on Xarelto, sleep apnea ABIs were not repeated today in the clinic as they are previously noncompressible as noted His MRI is listed below; This did not suggest osteomyelitis in the area of the right  foot where his wound is currently located MRI 03/29/23 IMPRESSION: 1. Deep skin wound about the plantar aspect of the first metatarsal head with mild marrow edema about the plantar aspect of the first metatarsal head concerning for osteomyelitis. 2. No fluid collection or abscess. 3. Postsurgical changes with prior amputation of the right foot through the metatarsophalangeal joints of the first and fifth digit and through metatarsal heads of the second through fourth digits. 4. Edema of the plantar muscles and tendons suggesting diabetic myopathy/myositis. 07-20-2023 upon evaluation today patient presents for follow-up evaluation here in the clinic. I did review his note from the last visit with Dr. Leanord Hawking. With that being said he is a current patient of vein and vascular here in [place] and has seen them last it looks like according to Dr. Jannetta Quint note in January of this year. At that point he had multiphasic waveforms on the right with a TBI of 0.44 on the left. I do not see any recent x-rays currently although he has seen podiatry I cannot see any of those x-rays. I really think having him go for a x-ray of the foot would be a good idea. 07-27-2023 upon evaluation today patient appears to be doing well currently in regard to his wound. He is actually showing signs of good improvement. I actually feel like this is significantly improved compared to last week's evaluation he does seem to have good granulation and epithelization and very pleased there is some callus I am going to need to remove however. 08-10-2023 upon evaluation today patient appears to be doing well currently in regard to his wound. The foot actually appears to be showing signs of excellent improvement and I am very pleased in that regard. Fortunately I do not see any evidence of worsening overall and I do believe that the patient is making good headway towards closure. For now I am probably going to recommend that we continue  with the [facility] although we may make a switch to collagen in the near future depending on how things progress. KAYSEAN, LITTLETON ([id number]) [id number] Page 6 of 8 08-24-2023 upon evaluation today patient appears to be doing well currently in regard to his wound. He has been tolerating the dressing changes I do believe it may be good to switch over to endoform at this point he is in agreement with that plan. Fortunately I do not see any evidence of infection or worsening overall and I am pleased in that regard. 08-31-2023 upon evaluation today patient's wound bed actually showed signs of good granulation epithelization at this point. Fortunately I do not see any signs of infection he seems to be doing quite well based on what I am seeing today. 09-07-2023 upon evaluation today patient appears to be doing well currently in regard to his wound. Has been tolerating the dressing changes without complication. Fortunately I do not see any signs of active infection locally or systemically at this time which is great news. No fevers, chills, nausea, vomiting, or diarrhea. 09-14-2023 upon evaluation today patient appears to be doing excellent in regard to his foot ulcer. This is getting very close to complete resolution. Fortunately I do not see any  evidence of worsening overall and I do believe that the patient is making excellent headway towards complete closure. 11/6; this is a wound on the right TMA tip at the level roughly of the fourth metatarsal. Small wound with 0.5 cm of depth we have been using endoform ABDs and gauze. 11/13; right TMA at roughly the fourth metatarsal. The surface callus raised the possibility that there was healing a healed state here. We have been using endoform ABDs and gauze. 11/20; right TMA at roughly the fourth metatarsal again comes in with surface callus. This requires debridement we have been using endoform ABDs and gauze this is being  changed daily by a neighbor who has medical training Objective Constitutional Sitting or standing Blood Pressure is within target range for patient.. Pulse regular and within target range for patient.Marland Kitchen Respirations regular, non-labored and within target range.. Temperature is normal and within the target range for the patient.Marland Kitchen Appears in no distress. Vitals Time Taken: 11:58 AM, Height: 80 in, Weight: 280 lbs, BMI: 30.8, Temperature: 97.6 F, Pulse: 87 bpm, Respiratory Rate: 18 breaths/min, Blood Pressure: 133/67 mmHg. General Notes: Wound exam punched-out area of the right lateral TMA at roughly the fourth metatarsal head. Still callus around the wound edge with undermining. I used a #3 curette to remove the skin callus to expose the wound which still has some depth. There is no evidence of infection Integumentary (Hair, Skin) Wound #5 status is Open. Original cause of wound was Gradually Appeared. The date acquired was: 04/19/2023. The wound has been in treatment 12 weeks. 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 The wound measures 0.3cm length x 0.3cm width x 0.3cm depth; 0.071cm^2 area and 0.021cm^3 volume. There is Fat Layer (Subcutaneous Tissue) exposed. There is no tunneling noted, however, there is undermining starting at 9:00 and ending at 6:00 with a maximum distance of 0.3cm. There is a medium amount of serosanguineous drainage noted. The wound margin is distinct with the outline attached to the wound base. There is large (67-100%) red granulation within the wound bed. There is a small (1-33%) amount of necrotic tissue within the wound bed including Eschar and Adherent Slough. The periwound skin appearance had no abnormalities noted for color. The periwound skin appearance exhibited: Callus, Scarring, Dry/Scaly. Periwound temperature was noted as No Abnormality. Assessment Active Problems ICD-10 Type 2 diabetes mellitus with foot ulcer Non-pressure chronic  ulcer of other part of right foot with other specified severity Type 2 diabetes mellitus with diabetic peripheral angiopathy without gangrene Type 2 diabetes mellitus with diabetic polyneuropathy Procedures Wound #5 Pre-procedure diagnosis of Wound #5 is a Diabetic Wound/Ulcer of the Lower Extremity located on the Right Amputation Site - Transmetatarsal .Severity of Tissue Pre Debridement is: Fat layer exposed. There was a Selective/Open Wound Skin/Epidermis Debridement with a total area of 0.07 sq cm performed by Maxwell Caul., MD. With the following instrument(s): Curette to remove Viable and Non-Viable tissue/material. Material removed includes Eschar, Callus, Skin: Dermis, and Skin: Epidermis after achieving pain control using Lidocaine 5% topical ointment. No specimens were taken. A time out was conducted at 12:21, prior to the start of the procedure. A Minimum amount of bleeding was controlled with Pressure. The procedure was tolerated well. Post Debridement Measurements: 0.3cm length x 0.3cm width x 0.3cm depth; 0.021cm^3 volume. Character of Wound/Ulcer Post Debridement is improved. Severity of Tissue Post Debridement is: Fat layer exposed. Post procedure Diagnosis Wound #5: Same as Pre-Procedure AHMIER, CERRITOS ([id number]) [phone].pdf Page 7  of 8 Plan Follow-up Appointments: Return Appointment in 1 week. - Dr. Leanord Hawking [facility] 10/19/23 at 11am Return Appointment in 2 weeks. - Dr. Bernie Covey week-can skip this week appointment wise. Return appointment in 3 weeks. - Dr. Leanord Hawking Please schedule an appointment with front desk Anesthetic: (In clinic) Topical Lidocaine 4% applied to wound bed - Used in Clinic Bathing/ Shower/ Hygiene: May shower and wash wound with soap and water. - Please keep the wound dressing dry on the days the wound dressing is not being changed Home Health: Wound #5 Right Amputation Site - Transmetatarsal: Discontinue home  health for wound care. - Discontinue Home Health Other Home Health Orders/Instructions: - Wound Care by [facility] at least twice a week.-D/C'd due to no insurance authorization (09/28/23) WOUND #5: - Amputation Site - Transmetatarsal Wound Laterality: Right Cleanser: Normal Saline (Generic) Every Other Day/30 Days Discharge Instructions: use to slightly moisten Endoform before placing in the wound bed Cleanser: Soap and Water Every Other Day/30 Days Discharge Instructions: May shower and wash wound with dial antibacterial soap and water prior to dressing change. Cleanser: Vashe 5.8 (oz) Every Other Day/30 Days Discharge Instructions: Cleanse the wound with Vashe prior to applying a clean dressing using gauze sponges, not tissue or cotton balls. Cleanser: Wound Cleanser Every Other Day/30 Days Discharge Instructions: Cleanse the wound with wound cleanser prior to applying a clean dressing using gauze sponges, not tissue or cotton balls. Prim Dressing: Endoform 2x2 in (Generic) Every Other Day/30 Days ary Discharge Instructions: Moisten with saline Secondary Dressing: ABD Pad, 5x9 (Generic) Every Other Day/30 Days Discharge Instructions: Apply over primary dressing as directed. Secondary Dressing: Woven Gauze Sponge, Non-Sterile 4x4 in (Generic) Every Other Day/30 Days Discharge Instructions: Apply over primary dressing as directed. Secured With: [employer], 4.5x3.1 (in/yd) (Generic) Every Other Day/30 Days Discharge Instructions: Secure with Kerlix as directed. Secured With: Paper T ape, 2x10 (in/yd) (Generic) Every Other Day/30 Days Discharge Instructions: Secure dressing with tape as directed. 1. I am still continuing with endoform 2. I asked him to back off on the dressing from daily to every other day 3. No evidence of infection Electronic Signature(s) Signed: 10/05/2023 4:37:54 PM By: Jason Najjar MD Entered By: Jason [name] on 10/05/2023  09:37:59 -------------------------------------------------------------------------------- SuperBill Details Patient Name: Date of Service: [name] 10/05/2023 Medical Record Number: [id number] Patient Account Number: [id number] Date of Birth/Sex: Treating RN: 09-30-1941 (82 y.o. M) Primary Care Provider: [name] Other Clinician: Referring Provider: Treating Provider/Extender: Erasmo Downer in Treatment: 12 Diagnosis Coding ICD-10 Codes Code Description E11.621 Type 2 diabetes mellitus with foot ulcer L97.518 Non-pressure chronic ulcer of other part of right foot with other specified severity E11.51 Type 2 diabetes mellitus with diabetic peripheral angiopathy without gangrene E11.42 Type 2 diabetes mellitus with diabetic polyneuropathy Facility Procedures : TREYLON, REHOR Code: [id number] Jason [name] ([id number] Description: [phone] - DEBRIDE WOUND 1ST 20 SQ CM OR < ICD-10 Diagnosis Description L97.518 Non-pressure chronic ulcer of other part of right foot with other specified sever 0) [phone] Modifier: ity _Physician_512 Quantity: 1 27.pdf Page 8 of 8 Physician Procedures : CPT4 Code Description Modifier [id number] [phone] - WC PHYS DEBR WO ANESTH 20 SQ CM ICD-10 Diagnosis Description L97.518 Non-pressure chronic ulcer of other part of right foot with other specified severity Quantity: 1 Electronic Signature(s) Signed: 10/05/2023 4:37:54 PM By: Jason Najjar MD Entered By: Jason [name] on 10/05/2023 09:38:12

## 2023-10-07 NOTE — Progress Notes (Signed)
Jason [name] ([id number]) [id number] Page 1 of 7 Visit Report for 10/05/2023 Arrival Information Details Patient Name: Date of Service: Jason [name], Jason [name] 10/05/2023 11:15 A M Medical Record Number: [id number] Patient Account Number: [id number] Date of Birth/Sex: Treating [name]: 1941-10-12 (82 y.o. M) Primary Care Legend Pecore: Jason [name] Other Clinician: Referring Lili Harts: Treating Jason [name]: Jason [name] in Treatment: 12 Visit Information History Since Last Visit Added or deleted any medications: No Patient Arrived: Gilmer Mor Any new allergies or adverse reactions: No Arrival Time: 11:55 Had a fall or experienced change in No Accompanied By: wife activities of daily living that may affect Transfer Assistance: None risk of falls: Patient Identification Verified: Yes Signs or symptoms of abuse/neglect since last visito No Secondary Verification Process Completed: Yes Hospitalized since last visit: No Patient Has Alerts: Yes Implantable device outside of the clinic excluding No Patient Alerts: ABI R 1.46 (03/29/23) cellular tissue based products placed in the center ABI L 1.63 (03/29/23) since last visit: Has Dressing in Place as Prescribed: Yes Pain Present Now: No Electronic Signature(s) Signed: 10/07/2023 11:01:27 AM By: Jason [name] Entered By: Jason [name] on 10/05/2023 08:58:34 -------------------------------------------------------------------------------- Encounter Discharge Information Details Patient Name: Date of Service: Jason Milian S. 10/05/2023 11:15 A M Medical Record Number: [id number] Patient Account Number: [id number] Date of Birth/Sex: Treating [name]: 05-31-1941 (82 y.o. [name] Primary Care Ione [place]: Jason [name] Other Clinician: Referring Jason [name]: Treating Vontae Court/Extender: Jason [name] in Treatment: 12 Encounter Discharge Information Items Post Procedure Vitals Discharge  Condition: Stable Temperature (F): 97.6 Ambulatory Status: Cane Pulse (bpm): 87 Discharge Destination: Home Respiratory Rate (breaths/min): 18 Transportation: Private Auto Blood Pressure (mmHg): 133/67 Accompanied By: spouse Schedule Follow-up Appointment: Yes Clinical Summary of Care: Patient Declined Electronic Signature(s) Signed: 10/05/2023 1:14:50 PM By: Jason [name] Entered By: Jason Schwalbe on 10/05/2023 10:14:23 [name] ([id number]) [id number] Page 2 of 7 -------------------------------------------------------------------------------- Lower Extremity Assessment Details Patient Name: Date of Service: Jason [name] 10/05/2023 11:15 A M Medical Record Number: [id number] Patient Account Number: [id number] Date of Birth/Sex: Treating [name]: 1941/01/15 (82 y.o. M) Primary Care Tandre Conly: Jason [name] Other Clinician: Referring Mervin Ramires: Treating Marquest Gunkel/Extender: Jason [name] in Treatment: 12 Edema Assessment Assessed: Kyra Searles: No] Franne Forts: No] Edema: [Left: N] [Right: o] Calf Left: Right: Point of Measurement: 38 cm From Medial Instep 42.5 cm Ankle Left: Right: Point of Measurement: 11 cm From Medial Instep 27.5 cm Vascular Assessment Extremity colors, hair growth, and conditions: Extremity Color: [Right:Pale] Hair Growth on Extremity: [Right:No] Temperature of Extremity: [Right:Warm] Dependent Rubor: [Right:No No] Electronic Signature(s) Signed: 10/07/2023 11:01:27 AM By: Jason [name] Entered By: Jason [name] on 10/05/2023 09:05:53 -------------------------------------------------------------------------------- Multi Wound Chart Details Patient Name: Date of Service: Jason Milian S. 10/05/2023 11:15 A M Medical Record Number: [id number] Patient Account Number: [id number] Date of Birth/Sex: Treating [name]: 1941/01/31 (82 y.o. M) Primary Care Kimila Papaleo: Jason [name] Other Clinician: Referring  Kashton Mcartor: Treating Sahir Tolson/Extender: Jason [name] in Treatment: 12 Vital Signs Height(in): 80 Pulse(bpm): 87 Weight(lbs): 280 Blood Pressure(mmHg): 133/67 Body Mass Index(BMI): 30.8 Temperature(F): 97.6 Respiratory Rate(breaths/min): 18 [5:Photos:] [N/A:N/A] Right Amputation Site - N/A N/A Wound Location: Transmetatarsal Gradually Appeared N/A N/A Wounding Event: Diabetic Wound/Ulcer of the Lower N/A N/A Primary Etiology: Extremity Sleep Apnea, Arrhythmia, Congestive N/A N/A Comorbid History: Heart Failure, Peripheral Arterial Disease, Peripheral Venous Disease, Type II Diabetes, Neuropathy 04/19/2023 N/A N/A Date Acquired: 12 N/A N/A Weeks of Treatment: Open N/A N/A Wound Status:  No N/A N/A Wound Recurrence: 0.3x0.3x0.3 N/A N/A Measurements L x W x D (cm) 0.071 N/A N/A A (cm) : rea 0.021 N/A N/A Volume (cm) : 97.90% N/A N/A % Reduction in A rea: 98.40% N/A N/A % Reduction in Volume: 9 Starting Position 1 (o'clock): 6 Ending Position 1 (o'clock): 0.3 Maximum Distance 1 (cm): Yes N/A N/A Undermining: Grade 2 N/A N/A Classification: Medium N/A N/A Exudate A mount: Serosanguineous N/A N/A Exudate Type: red, brown N/A N/A Exudate Color: Distinct, outline attached N/A N/A Wound Margin: Large (67-100%) N/A N/A Granulation A mount: Red N/A N/A Granulation Quality: Small (1-33%) N/A N/A Necrotic A mount: Eschar, Adherent Slough N/A N/A Necrotic Tissue: Fat Layer (Subcutaneous Tissue): Yes N/A N/A Exposed Structures: Fascia: No Tendon: No Muscle: No Joint: No Bone: No Small (1-33%) N/A N/A Epithelialization: Debridement - Selective/Open Wound N/A N/A Debridement: Pre-procedure Verification/Time Out 12:21 N/A N/A Taken: Lidocaine 5% topical ointment N/A N/A Pain Control: Callus N/A N/A Tissue Debrided: Skin/Epidermis N/A N/A Level: 0.07 N/A N/A Debridement A (sq cm): rea Curette N/A N/A Instrument: Minimum N/A  N/A Bleeding: Pressure N/A N/A Hemostasis A chieved: Procedure was tolerated well N/A N/A Debridement Treatment Response: 0.3x0.3x0.3 N/A N/A Post Debridement Measurements L x W x D (cm) 0.021 N/A N/A Post Debridement Volume: (cm) Callus: Yes N/A N/A Periwound Skin Texture: Scarring: Yes Dry/Scaly: Yes N/A N/A Periwound Skin Moisture: No Abnormalities Noted N/A N/A Periwound Skin Color: No Abnormality N/A N/A Temperature: Debridement N/A N/A Procedures Performed: Treatment Notes Electronic Signature(s) Signed: 10/05/2023 4:37:54 PM By: Baltazar Najjar MD Entered By: Baltazar Najjar on 10/05/2023 09:32:00 -------------------------------------------------------------------------------- Multi-Disciplinary Care Plan Details Patient Name: Date of Service: Jason Milian S. 10/05/2023 11:15 A M Medical Record Number: [id number] Patient Account Number: [id number] Date of Birth/Sex: Treating [name]: May 09, 1941 (82 y.o. Cade, Swayzer, [place] ([id number]) [id number] Page 4 of 7 Primary Care Darci Lykins: Jason [name] Other Clinician: Referring Lauri Till: Treating Alahna Dunne/Extender: Jason [name] in Treatment: 12 Active Inactive Wound/Skin Impairment Nursing Diagnoses: Impaired tissue integrity Goals: Patient/caregiver will verbalize understanding of skin care regimen Date Initiated: 07/13/2023 Target Resolution Date: 11/14/2023 Goal Status: Active Interventions: Assess ulceration(s) every visit Treatment Activities: Skin care regimen initiated : 07/13/2023 Notes: Electronic Signature(s) Signed: 10/05/2023 1:14:50 PM By: Jason [name] Entered By: Jason Schwalbe on 10/05/2023 10:12:18 -------------------------------------------------------------------------------- Pain Assessment Details Patient Name: Date of Service: Jason [name]. 10/05/2023 11:15 A M Medical Record Number: [id number] Patient Account Number:  [id number] Date of Birth/Sex: Treating [name]: Oct 05, 1941 (82 y.o. M) Primary Care Keyshaun Exley: Jason [name] Other Clinician: Referring Kionte Baumgardner: Treating Rylynn Schoneman/Extender: Jason [name] in Treatment: 12 Active Problems Location of Pain Severity and Description of Pain Patient Has Paino No Site Locations Pain Management and Medication Current Pain Management: Electronic Signature(s) Jason [name], Jason [name] ([id number]) [id number] Page 5 of 7 Signed: 10/07/2023 11:01:27 AM By: Jason [name] Entered By: Jason [name] on 10/05/2023 09:01:15 -------------------------------------------------------------------------------- Patient/Caregiver Education Details Patient Name: Date of Service: Jason [name] 11/20/2024andnbsp11:15 A M Medical Record Number: [id number] Patient Account Number: [id number] Date of Birth/Gender: Treating [name]: 1941-09-22 (82 y.o. [name] Primary Care Physician: Jason [name] Other Clinician: Referring Physician: Treating Physician/Extender: Jason [name] in Treatment: 12 Education Assessment Education Provided To: Patient Education Topics Provided Wound/Skin Impairment: Methods: Explain/Verbal Responses: State content correctly Electronic Signature(s) Signed: 10/05/2023 1:14:50 PM By: Jason [name] Entered By: Jason Schwalbe on 10/05/2023 10:12:30 -------------------------------------------------------------------------------- Wound Assessment Details Patient Name: Date  of Service: Jason [name], Jason [name] 10/05/2023 11:15 A M Medical Record Number: [id number] Patient Account Number: [id number] Date of Birth/Sex: Treating [name]: 02-Nov-1941 (82 y.o. M) Primary Care Juliahna Wiswell: Jason [name] Other Clinician: Referring Kaya Klausing: Treating Charmain Diosdado/Extender: Jason [name] in Treatment: 12 Wound Status Wound Number: 5 Primary Diabetic Wound/Ulcer of the Lower Extremity Etiology: Wound  Location: Right Amputation Site - Transmetatarsal Wound Open Wounding Event: Gradually Appeared Status: Date Acquired: 04/19/2023 Comorbid Sleep Apnea, Arrhythmia, Congestive Heart Failure, Peripheral Weeks Of Treatment: 12 History: Arterial Disease, Peripheral Venous Disease, Type II Diabetes, Clustered Wound: No Neuropathy Photos Jason [name], Jason [name] ([id number]) [id number] Page 6 of 7 Wound Measurements Length: (cm) 0.3 Width: (cm) 0.3 Depth: (cm) 0.3 Area: (cm) 0.071 Volume: (cm) 0.021 % Reduction in Area: 97.9% % Reduction in Volume: 98.4% Epithelialization: Small (1-33%) Tunneling: No Undermining: Yes Starting Position (o'clock): 9 Ending Position (o'clock): 6 Maximum Distance: (cm) 0.3 Wound Description Classification: Grade 2 Wound Margin: Distinct, outline attached Exudate Amount: Medium Exudate Type: Serosanguineous Exudate Color: red, brown Foul Odor After Cleansing: No Slough/Fibrino Yes Wound Bed Granulation Amount: Large (67-100%) Exposed Structure Granulation Quality: Red Fascia Exposed: No Necrotic Amount: Small (1-33%) Fat Layer (Subcutaneous Tissue) Exposed: Yes Necrotic Quality: Eschar, Adherent Slough Tendon Exposed: No Muscle Exposed: No Joint Exposed: No Bone Exposed: No Periwound Skin Texture Texture Color No Abnormalities Noted: No No Abnormalities Noted: Yes Callus: Yes Temperature / Pain Scarring: Yes Temperature: No Abnormality Moisture No Abnormalities Noted: No Dry / Scaly: Yes Treatment Notes Wound #5 (Amputation Site - Transmetatarsal) Wound Laterality: Right Cleanser Normal Saline Discharge Instruction: use to slightly moisten Endoform before placing in the wound bed Soap and Water Discharge Instruction: May shower and wash wound with dial antibacterial soap and water prior to dressing change. Vashe 5.8 (oz) Discharge Instruction: Cleanse the wound with Vashe prior to applying a clean dressing using  gauze sponges, not tissue or cotton balls. Wound Cleanser Discharge Instruction: Cleanse the wound with wound cleanser prior to applying a clean dressing using gauze sponges, not tissue or cotton balls. Peri-Wound Care Topical Primary Dressing Endoform 2x2 in Discharge Instruction: Moisten with saline Secondary Dressing ABD Pad, 5x9 Jason [name], Jason [name] ([id number]) [id number] Page 7 of 7 Discharge Instruction: Apply over primary dressing as directed. Woven Gauze Sponge, Non-Sterile 4x4 in Discharge Instruction: Apply over primary dressing as directed. Secured With [employer], 4.5x3.1 (in/yd) Discharge Instruction: Secure with Kerlix as directed. Paper Tape, 2x10 (in/yd) Discharge Instruction: Secure dressing with tape as directed. Compression Wrap Compression Stockings Add-Ons Electronic Signature(s) Signed: 10/05/2023 1:14:50 PM By: Jason [name] Entered By: Jason Schwalbe on 10/05/2023 09:15:06 -------------------------------------------------------------------------------- Vitals Details Patient Name: Date of Service: Jason Milian S. 10/05/2023 11:15 A M Medical Record Number: [id number] Patient Account Number: [id number] Date of Birth/Sex: Treating [name]: June 03, 1941 (82 y.o. M) Primary Care Keiaira Donlan: Jason [name] Other Clinician: Referring Maxyne Derocher: Treating Callan Norden/Extender: Jason [name] in Treatment: 12 Vital Signs Time Taken: 11:58 Temperature (F): 97.6 Height (in): 80 Pulse (bpm): 87 Weight (lbs): 280 Respiratory Rate (breaths/min): 18 Body Mass Index (BMI): 30.8 Blood Pressure (mmHg): 133/67 Reference Range: 80 - 120 mg / dl Electronic Signature(s) Signed: 10/07/2023 11:01:27 AM By: Jason [name] Entered By: Jason [name] on 10/05/2023 09:01:10

## 2023-10-19 ENCOUNTER — Encounter (HOSPITAL_BASED_OUTPATIENT_CLINIC_OR_DEPARTMENT_OTHER): Attending: Physician Assistant | Admitting: Internal Medicine

## 2023-10-19 DIAGNOSIS — E1142 Type 2 diabetes mellitus with diabetic polyneuropathy: Secondary | ICD-10-CM | POA: Diagnosis not present

## 2023-10-19 DIAGNOSIS — Z7901 Long term (current) use of anticoagulants: Secondary | ICD-10-CM | POA: Diagnosis not present

## 2023-10-19 DIAGNOSIS — I5032 Chronic diastolic (congestive) heart failure: Secondary | ICD-10-CM | POA: Diagnosis not present

## 2023-10-19 DIAGNOSIS — I4891 Unspecified atrial fibrillation: Secondary | ICD-10-CM | POA: Insufficient documentation

## 2023-10-19 DIAGNOSIS — L97518 Non-pressure chronic ulcer of other part of right foot with other specified severity: Secondary | ICD-10-CM | POA: Insufficient documentation

## 2023-10-19 DIAGNOSIS — E1151 Type 2 diabetes mellitus with diabetic peripheral angiopathy without gangrene: Secondary | ICD-10-CM | POA: Insufficient documentation

## 2023-10-19 DIAGNOSIS — L84 Corns and callosities: Secondary | ICD-10-CM | POA: Diagnosis not present

## 2023-10-19 DIAGNOSIS — G4730 Sleep apnea, unspecified: Secondary | ICD-10-CM | POA: Insufficient documentation

## 2023-10-19 DIAGNOSIS — E11621 Type 2 diabetes mellitus with foot ulcer: Secondary | ICD-10-CM | POA: Insufficient documentation

## 2023-10-19 DIAGNOSIS — B351 Tinea unguium: Secondary | ICD-10-CM | POA: Diagnosis not present

## 2023-10-20 ENCOUNTER — Other Ambulatory Visit: Payer: Medicare HMO

## 2023-10-20 NOTE — Progress Notes (Addendum)
JOSHAU, KISHBAUGH ([id number]) [id number] Page 1 of 9 Visit Report for 10/19/2023 Debridement Details Patient Name: Date of Service: Jason [name], Jason [name] 10/19/2023 11:00 A M Medical Record Number: [id number] Patient Account Number: [id number] Date of Birth/Sex: Treating RN: 05-27-1941 (82 y.o. Male) Primary Care Provider: [name] Other Clinician: Referring Provider: Treating Provider/Extender: Erasmo Downer in Treatment: 14 Debridement Performed for Assessment: Wound #5 Right Amputation Site - Transmetatarsal Performed By: Physician Maxwell Caul., MD The following information was scribed by: Samuella Bruin The information was scribed for: Baltazar Najjar Debridement Type: Debridement Severity of Tissue Pre Debridement: Fat layer exposed Level of Consciousness (Pre-procedure): Awake and Alert Pre-procedure Verification/Time Out Yes - 10:56 Taken: Start Time: 10:56 Pain Control: Lidocaine 4% T opical Solution Percent of Wound Bed Debrided: 100% T Area Debrided (cm): otal 0.09 Tissue and other material debrided: Non-Viable, Callus, Subcutaneous, Skin: Epidermis Level: Skin/Subcutaneous Tissue Debridement Description: Excisional Instrument: Curette Specimen: Tissue Culture Number of Specimens T aken: 1 Bleeding: Minimum Hemostasis Achieved: Pressure Response to Treatment: Procedure was tolerated well Level of Consciousness (Post- Awake and Alert procedure): Post Debridement Measurements of Total Wound Length: (cm) 0.3 Width: (cm) 0.4 Depth: (cm) 1 Volume: (cm) 0.094 Character of Wound/Ulcer Post Debridement: Improved Severity of Tissue Post Debridement: Fat layer exposed Post Procedure Diagnosis Same as Pre-procedure Electronic Signature(s) Signed: 10/20/2023 4:45:28 AM By: Baltazar Najjar MD Entered By: Baltazar Najjar on 10/19/2023  09:20:05 -------------------------------------------------------------------------------- HPI Details Patient Name: Date of Service: Jason Milian S. 10/19/2023 11:00 A M Medical Record Number: [id number] Patient Account Number: [id number] Date of Birth/Sex: Treating RN: Jun 30, 1941 (82 y.o. Male) Primary Care Provider: [name] Other Clinician: Referring Provider: Treating Provider/Extender: Erasmo Downer in Treatment: [address] Winifred Olive ([id number]) [id number] Page 2 of 9 History of Present Illness Chronic/Inactive Conditions Condition 1: He was followed for a period of time by Dr. Durwin Nora of vein and vascular as far as I can see last saw him on 1//24. 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 Last arterial studies I see showed noncompressible ABIs bilaterally but multiphasic waveforms on the right but a TBI of 0.44 on the left HPI Description: 01/31/17; this is a 82 year old man who has a history of type 2 diabetes on oral agents. He has a history of wound difficulties however this is on his right foot in fact he has had amputations of his medial 3 toes. I did not actually look at the right foot today. He tells me about a month ago he was cutting his mycotic nails informed a wound on the medial aspect of the left great toe. He saw Dr. Logan Bores of podiatry. He has been using Neosporin for a while and now simply wrapping it with gauze. He states he does have peripheral neuropathy. His hemoglobin A1c in this clinic was 1. 02/21/1989 non-compressible on the left. He did have vascular studies done in 2015 showing ABIs of 1.4 bilaterally, TBI's of 1.0 on the right 1.1 on the left with triphasic waveforms. He has not had imaging studies that I'm aware of 02/07/17; this is a 82 year old man with type 2 diabetes on oral agents. [place]  [place] with a small open area on the medial aspect of his left great toe. This is in the setting of a significant mycotic nail for which he  follows with Dr. Logan Bores of podiatry READMISSION 07/13/2023 This is a now 82 year old man who is had problems with a wound on his right foot amputation site after undergoing a transmetatarsal amputation in May of this year. He was admitted to hospital from 03/29/2023 through 04/02/2023 with sepsis felt to be secondary to an infected wound I think on the right first metatarsal head he underwent a transmetatarsal amputation by podiatry in the hospital. A culture of the wound had previously shown Klebsiella. He was seen in follow-up in the office on 04/19/2023 with a postop wound infection and was given doxycycline and Augmentin for 14 days. He has been dressing the wounds with Betadine wet-to-dry. Looking through epic it would appear that the follow-up with the podiatry group has been sporadic. The patient tells me he was at [facility] skilled facility although he is back at home now. His wound now is actually probably between the fourth and fifth metatarsal head on the amputation site. He has a smaller wound on the dorsal foot in this area and I am sure at 1 time these connected but I could not prove this today. He just came in with kerlix and dry gauze on the wound. He was followed for a period of time by Dr. Durwin Nora of vein and vascular as far as I can see last saw him on 1//24. 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 Last arterial studies I see showed noncompressible ABIs bilaterally but multiphasic waveforms on the right but a TBI of 0.44 on the left Past medical history includes type 2 diabetes with peripheral neuropathy and PAD, diastolic heart failure, mitral valve stenosis, atrial fibrillation on Xarelto, sleep  apnea ABIs were not repeated today in the clinic as they are previously noncompressible as noted His MRI is listed below; This did not suggest osteomyelitis in the area of the right foot where his wound is currently located MRI 03/29/23 IMPRESSION: 1. Deep skin wound about the plantar aspect of the first metatarsal head with mild marrow edema about the plantar aspect of the first metatarsal head concerning for osteomyelitis. 2. No fluid collection or abscess. 3. Postsurgical changes with prior amputation of the right foot through the metatarsophalangeal joints of the first and fifth digit and through metatarsal heads of the second through fourth digits. 4. Edema of the plantar muscles and tendons suggesting diabetic myopathy/myositis. 07-20-2023 upon evaluation today patient presents for follow-up evaluation here in the clinic. I did review his note from the last visit with Dr. Leanord Hawking. With that being said he is a current patient of vein and vascular here in [place] and has seen them last it looks like according to Dr. Jannetta Quint note in January of this year. At that point he had multiphasic waveforms on the right with a TBI of 0.44 on the left. I do not see any recent x-rays currently although he has seen podiatry I cannot see any of those x-rays. I really think having him go for a x-ray of the foot would be a good idea. 07-27-2023 upon evaluation today patient appears to be doing well currently in regard to his wound. He is actually showing signs of good improvement. I actually feel like this is significantly improved compared to last week's evaluation he does seem to have good granulation and epithelization and very pleased there is some callus I am going to need to remove  however. 08-10-2023 upon evaluation today patient appears to be doing well currently in regard to his wound. The foot actually appears to be showing signs of excellent improvement and I am very pleased in that regard.  Fortunately I do not see any evidence of worsening overall and I do believe that the patient is making good headway towards closure. For now I am probably going to recommend that we continue with the [facility] although we may make a switch to collagen in the near future depending on how things progress. 08-24-2023 upon evaluation today patient appears to be doing well currently in regard to his wound. He has been tolerating the dressing changes I do believe it may be good to switch over to endoform at this point he is in agreement with that plan. Fortunately I do not see any evidence of infection or worsening overall and I am pleased in that regard. 08-31-2023 upon evaluation today patient's wound bed actually showed signs of good granulation epithelization at this point. Fortunately I do not see any signs of infection he seems to be doing quite well based on what I am seeing today. 09-07-2023 upon evaluation today patient appears to be doing well currently in regard to his wound. Has been tolerating the dressing changes without complication. Fortunately I do not see any signs of active infection locally or systemically at this time which is great news. No fevers, chills, nausea, vomiting, or diarrhea. 09-14-2023 upon evaluation today patient appears to be doing excellent in regard to his foot ulcer. This is getting very close to complete resolution. Fortunately I do not see any evidence of worsening overall and I do believe that the patient is making excellent headway towards complete closure. 11/6; this is a wound on the right TMA tip at the level roughly of the fourth metatarsal. Small wound with 0.5 cm of depth we have been using endoform ABDs and gauze. 11/13; right TMA at roughly the fourth metatarsal. The surface callus raised the possibility that there was healing a healed state here. We have been using endoform ABDs and gauze. 11/20; right TMA at roughly the fourth metatarsal again  comes in with surface callus. This requires debridement we have been using endoform ABDs and gauze this is being changed daily by a neighbor who has medical training 12/3; right TMA at roughly the fourth metatarsal head. Again a completely nonviable surface with callus dry skin and subcutaneous debris. I used a #3 curette Jason [name], Jason [name] ([id number]) [id number] Page 3 of 9 to remove all of this. The wound cleans up quite nicely however it has a worrisome amount of depth. He is a type II diabetic. 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 Annamary Rummage. I am assuming this is the only area that remains. Electronic Signature(s) Signed: 10/20/2023 4:45:28 AM By: Baltazar Najjar MD Entered By: Baltazar Najjar on 10/19/2023 09:21:32 -------------------------------------------------------------------------------- Physical Exam Details Patient Name: Date of Service: Jason Milian S. 10/19/2023 11:00 A M Medical Record Number: [id number] Patient Account Number: [id number] Date of Birth/Sex: Treating RN: [birth year] (82 y.o. Male) Primary Care Provider: [name] Other Clinician: Referring Provider: Treating Provider/Extender: Erasmo Downer in Treatment: 14 Constitutional Sitting or standing Blood Pressure is within target range for patient.. Pulse regular and within target range for patient.Marland Kitchen Respirations regular, non-labored and within target range.. Temperature is normal and within the target  range for the patient.Marland Kitchen Appears in no distress. Notes Wound exam; again a small punched-out area on the right lateral TMA roughly at the fourth met head. This probes considerably. I am again using a #3 curette to remove skin callus and subcutaneous debris. We did a PCR swab culture today. The overall wound depth is now at 1 cm. Electronic  Signature(s) Signed: 10/20/2023 4:45:28 AM By: Baltazar Najjar MD Entered By: Baltazar Najjar on 10/19/2023 09:22:47 -------------------------------------------------------------------------------- Physician Orders Details Patient Name: Date of Service: Jason Milian S. 10/19/2023 11:00 A M Medical Record Number: [id number] Patient Account Number: [id number] Date of Birth/Sex: Treating RN: [birth year] (82 y.o. Male) Samuella Bruin Primary Care Provider: [name] Other Clinician: Referring Provider: Treating Provider/Extender: Erasmo Downer in Treatment: 14 The following information was scribed by: Samuella Bruin The information was scribed for: Baltazar Najjar Verbal / Phone Orders: No Diagnosis Coding Follow-up Appointments ppointment in 1 week. - Dr. Emeline Darling Return A Anesthetic (In clinic) Topical Lidocaine 4% applied to wound bed - Used in Clinic Bathing/ Shower/ Hygiene May shower and wash wound with soap and water. - Please keep the wound dressing dry on the days the wound dressing is not being changed Home Health Wound #5 Right Amputation Site - Transmetatarsal Discontinue home health for wound care. - Discontinue Home Health Other Home Health Orders/Instructions: - Wound Care by [facility] at least twice a week.-D/C'd due to no insurance authorization (09/28/23) Jason [name], Jason [name] ([id number]) [id number] Page 4 of 9 Wound Treatment Wound #5 - Amputation Site - Transmetatarsal Wound Laterality: Right Cleanser: Normal Saline (Generic) Every Other Day/30 Days Discharge Instructions: use to slightly moisten Endoform before placing in the wound bed Cleanser: Soap and Water Every Other Day/30 Days Discharge Instructions: May shower and wash wound with dial antibacterial soap and water prior to dressing change. Cleanser: Vashe 5.8 (oz) Every Other Day/30 Days Discharge Instructions: Cleanse the wound with  Vashe prior to applying a clean dressing using gauze sponges, not tissue or cotton balls. Cleanser: Wound Cleanser Every Other Day/30 Days Discharge Instructions: Cleanse the wound with wound cleanser prior to applying a clean dressing using gauze sponges, not tissue or cotton balls. Prim Dressing: Maxorb Extra Ag+ Alginate Dressing, 2x2 (in/in) (DME) (Generic) Every Other Day/30 Days ary Discharge Instructions: Apply to wound bed as instructed Secondary Dressing: ABD Pad, 5x9 (Generic) Every Other Day/30 Days Discharge Instructions: Apply over primary dressing as directed. Secondary Dressing: Woven Gauze Sponge, Non-Sterile 4x4 in (Generic) Every Other Day/30 Days Discharge Instructions: Apply over primary dressing as directed. Secured With: [employer], 4.5x3.1 (in/yd) (Generic) Every Other Day/30 Days Discharge Instructions: Secure with Kerlix as directed. Secured With: Paper Tape, 2x10 (in/yd) (Generic) Every Other Day/30 Days Discharge Instructions: Secure dressing with tape as directed. Laboratory naerobe culture (MICRO) - PCR swab of nonhealing wound to right foot ICD10: E11.621 Bacteria identified in Unspecified specimen by A LOINC Code: 635-3 Convenience Name: Anaerobic culture Radiology X-ray, foot right - 2 views of the right foot right due to nonhealing wound on TMA at the 4th metatarsal CPT 73620 ICD10: E11.621 : Patient Medications llergies: Bactrim A Notifications Medication Indication Start End 10/19/2023 lidocaine DOSE topical 4 % cream - cream topical Electronic Signature(s) Signed: 10/19/2023 4:04:24 PM By: Samuella Bruin Signed: 10/20/2023 4:45:28 AM By: Baltazar Najjar MD Entered By: Samuella Bruin on 10/19/2023 08:07:23 Prescription 10/19/2023 -------------------------------------------------------------------------------- Jason Kohut MD Patient Name: Provider: 16-Aug-1941 [id number] Date of Birth: NPI#: Male [id number] Sex:  DEA #: [phone] [id number] Phone #: License #: [id number] UPN: Patient Address: [address] [place]igh[place] Bridegroom [facility] [place], [place] [address] [address] [place], [place] [address] NEILAN, RIZZO ([id number]) [id number] Page 5 of 9 [id number] Allergies Bactrim Provider's Orders X-ray, foot right - 2 views of the right foot right due to nonhealing wound on TMA at the 4th metatarsal CPT 73620 ICD10: E11.621 : Hand Signature: Date(s): Electronic Signature(s) Signed: 10/19/2023 4:04:24 PM By: Samuella Bruin Signed: 10/20/2023 4:45:28 AM By: Baltazar Najjar MD Entered By: Samuella Bruin on 10/19/2023 08:07:24 -------------------------------------------------------------------------------- Problem List Details Patient Name: Date of Service: Jason Milian S. 10/19/2023 11:00 A M Medical Record Number: [id number] Patient Account Number: [id number] Date of Birth/Sex: Treating RN: 12-Oct-1941 (82 y.o. Male) Primary Care Provider: [name] Other Clinician: Referring Provider: Treating Provider/Extender: Erasmo Downer in Treatment: 14 Active Problems ICD-10 Encounter Code Description Active Date MDM Diagnosis E11.621 Type 2 diabetes mellitus with foot ulcer 07/13/2023 No Yes L97.518 Non-pressure chronic ulcer of other part of right foot with other specified 07/13/2023 No Yes severity E11.51 Type 2 diabetes mellitus with diabetic peripheral angiopathy without gangrene 07/13/2023 No Yes E11.42 Type 2 diabetes mellitus with diabetic polyneuropathy 07/13/2023 No Yes Inactive Problems Resolved Problems Electronic Signature(s) Signed: 10/20/2023 4:45:28 AM By: Baltazar Najjar MD Entered By: Baltazar Najjar on 10/19/2023 09:19:11 Jagoda, Winifred Olive ([id number]) [id number] Page 6 of  9 -------------------------------------------------------------------------------- Progress Note Details Patient Name: Date of Service: Jason [name], Jason [name] 10/19/2023 11:00 A M Medical Record Number: [id number] Patient Account Number: [id number] Date of Birth/Sex: Treating RN: [birth year] (82 y.o. Male) Primary Care Provider: [name] Other Clinician: Referring Provider: Treating Provider/Extender: Erasmo Downer in Treatment: 14 Subjective History of Present Illness (HPI) Chronic/Inactive Condition: He was followed for a period of time by Dr. Durwin Nora of vein and vascular as far as I can see last saw him on 1//24. 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 Last arterial studies I see showed noncompressible ABIs bilaterally but multiphasic waveforms on the right but a TBI of 0.44 on the left 01/31/17; this is a 82 year old man who has a history of type 2 diabetes on oral agents. He has a history of wound difficulties however this is on his right foot in fact he has had amputations of his medial 3 toes. I did not actually look at the right foot today. He tells me about a month ago he was cutting his mycotic nails informed a wound on the medial aspect of the left great toe. He saw Dr. Logan Bores of podiatry. He has been using Neosporin for a while and now simply wrapping it with gauze. He states he does have peripheral neuropathy. His hemoglobin A1c in this clinic was 1. 02/21/1989 non-compressible on the left. He did have vascular studies done in 2015 showing ABIs of 1.4 bilaterally, TBI's of 1.0 on the right 1.1 on the left with triphasic waveforms. He has not had imaging studies that I'm aware of 02/07/17; this is a 82 year old man with type 2 diabetes on oral agents. [place] with a small open area on the medial aspect of his left great  toe. This is in the setting of a significant mycotic nail for which he follows with Dr. Logan Bores of podiatry READMISSION  07/13/2023 This is a now 82 year old man who is had problems with a wound on his right foot amputation site after undergoing a transmetatarsal amputation in May of this year. He was admitted to hospital from 03/29/2023 through 04/02/2023 with sepsis felt to be secondary to an infected wound I think on the right first metatarsal head he underwent a transmetatarsal amputation by podiatry in the hospital. A culture of the wound had previously shown Klebsiella. He was seen in follow-up in the office on 04/19/2023 with a postop wound infection and was given doxycycline and Augmentin for 14 days. He has been dressing the wounds with Betadine wet-to-dry. Looking through epic it would appear that the follow-up with the podiatry group has been sporadic. The patient tells me he was at [facility] skilled facility although he is back at home now. His wound now is actually probably between the fourth and fifth metatarsal head on the amputation site. He has a smaller wound on the dorsal foot in this area and I am sure at 1 time these connected but I could not prove this today. He just came in with kerlix and dry gauze on the wound. He was followed for a period of time by Dr. Durwin Nora of vein and vascular as far as I can see last saw him on 1//24. 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 Last arterial studies I see showed noncompressible ABIs bilaterally but multiphasic waveforms on the right but a TBI of 0.44 on the left Past medical history includes type 2 diabetes with peripheral neuropathy and PAD, diastolic heart failure, mitral valve stenosis, atrial fibrillation on Xarelto, sleep apnea ABIs were not repeated today in the clinic as they are previously noncompressible  as noted His MRI is listed below; This did not suggest osteomyelitis in the area of the right foot where his wound is currently located MRI 03/29/23 IMPRESSION: 1. Deep skin wound about the plantar aspect of the first metatarsal head with mild marrow edema about the plantar aspect of the first metatarsal head concerning for osteomyelitis. 2. No fluid collection or abscess. 3. Postsurgical changes with prior amputation of the right foot through the metatarsophalangeal joints of the first and fifth digit and through metatarsal heads of the second through fourth digits. 4. Edema of the plantar muscles and tendons suggesting diabetic myopathy/myositis. 07-20-2023 upon evaluation today patient presents for follow-up evaluation here in the clinic. I did review his note from the last visit with Dr. Leanord Hawking. With that being said he is a current patient of vein and vascular here in [place] and has seen them last it looks like according to Dr. Jannetta Quint note in January of this year. At that point he had multiphasic waveforms on the right with a TBI of 0.44 on the left. I do not see any recent x-rays currently although he has seen podiatry I cannot see any of those x-rays. I really think having him go for a x-ray of the foot would be a good idea. 07-27-2023 upon evaluation today patient appears to be doing well currently in regard to his wound. He is actually showing signs of good improvement. I actually feel like this is significantly improved compared to last week's evaluation he does seem to have good granulation and epithelization and very pleased there is some callus I am going to need to remove however. 08-10-2023 upon evaluation today patient appears  to be doing well currently in regard to his wound. The foot actually appears to be showing signs of excellent improvement and I am very pleased in that regard. Fortunately I do not see any evidence of worsening overall and I do believe that the patient is  making good headway towards closure. For now I am probably going to recommend that we continue with the [facility] although we may make a switch to collagen in the near future depending on how things progress. 08-24-2023 upon evaluation today patient appears to be doing well currently in regard to his wound. He has been tolerating the dressing changes I do believe it may be good to switch over to endoform at this point he is in agreement with that plan. Fortunately I do not see any evidence of infection or worsening overall and I am pleased in that regard. 08-31-2023 upon evaluation today patient's wound bed actually showed signs of good granulation epithelization at this point. Fortunately I do not see any signs of infection he seems to be doing quite well based on what I am seeing today. 09-07-2023 upon evaluation today patient appears to be doing well currently in regard to his wound. Has been tolerating the dressing changes without complication. Fortunately I do not see any signs of active infection locally or systemically at this time which is great news. No fevers, chills, nausea, vomiting, or diarrhea. 09-14-2023 upon evaluation today patient appears to be doing excellent in regard to his foot ulcer. This is getting very close to complete resolution. Fortunately Jason [name], Jason [name] ([id number]) [id number] Page 7 of 9 I do not see any evidence of worsening overall and I do believe that the patient is making excellent headway towards complete closure. 11/6; this is a wound on the right TMA tip at the level roughly of the fourth metatarsal. Small wound with 0.5 cm of depth we have been using endoform ABDs and gauze. 11/13; right TMA at roughly the fourth metatarsal. The surface callus raised the possibility that there was healing a healed state here. We have been using endoform ABDs and gauze. 11/20; right TMA at roughly the fourth metatarsal again comes in with  surface callus. This requires debridement we have been using endoform ABDs and gauze this is being changed daily by a neighbor who has medical training 12/3; right TMA at roughly the fourth metatarsal head. Again a completely nonviable surface with callus dry skin and subcutaneous debris. I used a #3 curette to remove all of this. The wound cleans up quite nicely however it has a worrisome amount of depth. He is a type II diabetic. 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 Annamary Rummage. I am assuming this is the only area that remains. Objective Constitutional Sitting or standing Blood Pressure is within target range for patient.. Pulse regular and within target range for patient.Marland Kitchen Respirations regular, non-labored and within target range.. Temperature is normal and within the target range for the patient.Marland Kitchen Appears in no distress. Vitals Time Taken: 10:45 AM, Height: 80 in, Weight: 280 lbs, BMI: 30.8, Temperature: 98.5 F, Pulse: 58 bpm, Respiratory Rate: 18 breaths/min, Blood Pressure: 108/56 mmHg. General Notes: Wound exam; again a small punched-out area on the right lateral TMA roughly at the fourth met head. This probes considerably. I am again using a #3 curette to remove skin callus and subcutaneous debris. We did a PCR swab  culture today. The overall wound depth is now at 1 cm. Integumentary (Hair, Skin) Wound #5 status is Open. Original cause of wound was Gradually Appeared. The date acquired was: 04/19/2023. The wound has been in treatment 14 weeks. 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 The wound measures 0.3cm length x 0.4cm width x 1cm depth; 0.094cm^2 area and 0.094cm^3 volume. There is Fat Layer (Subcutaneous Tissue) exposed. There is undermining starting at 12:00 and ending at 12:00 with a maximum distance of 0.4cm. There is a medium amount of  serosanguineous drainage noted. The wound margin is distinct with the outline attached to the wound base. There is medium (34-66%) red granulation within the wound bed. There is a medium (34-66%) amount of necrotic tissue within the wound bed including Eschar. The periwound skin appearance had no abnormalities noted for color. The periwound skin appearance exhibited: Callus, Scarring, Dry/Scaly. Periwound temperature was noted as No Abnormality. Assessment Active Problems ICD-10 Type 2 diabetes mellitus with foot ulcer Non-pressure chronic ulcer of other part of right foot with other specified severity Type 2 diabetes mellitus with diabetic peripheral angiopathy without gangrene Type 2 diabetes mellitus with diabetic polyneuropathy Procedures Wound #5 Pre-procedure diagnosis of Wound #5 is a Diabetic Wound/Ulcer of the Lower Extremity located on the Right Amputation Site - Transmetatarsal .Severity of Tissue Pre Debridement is: Fat layer exposed. There was a Excisional Skin/Subcutaneous Tissue Debridement with a total area of 0.09 sq cm performed by Maxwell Caul., MD. With the following instrument(s): Curette to remove Non-Viable tissue/material. Material removed includes Callus, Subcutaneous Tissue, and Skin: Epidermis after achieving pain control using Lidocaine 4% T opical Solution. 1 specimen was taken by a Tissue Culture and sent to the lab per facility protocol. A time out was conducted at 10:56, prior to the start of the procedure. A Minimum amount of bleeding was controlled with Pressure. The procedure was tolerated well. Post Debridement Measurements: 0.3cm length x 0.4cm width x 1cm depth; 0.094cm^3 volume. Character of Wound/Ulcer Post Debridement is improved. Severity of Tissue Post Debridement is: Fat layer exposed. Post procedure Diagnosis Wound #5: Same as Pre-Procedure Plan Follow-up Appointments: Return Appointment in 1 week. - Dr. Leanord Hawking [facility] Anesthetic: Jason [name], Jason [name] ([id number]) [id number] Page 8 of 9 (In clinic) Topical Lidocaine 4% applied to wound bed - Used in Clinic Bathing/ Shower/ Hygiene: May shower and wash wound with soap and water. - Please keep the wound dressing dry on the days the wound dressing is not being changed Home Health: Wound #5 Right Amputation Site - Transmetatarsal: Discontinue home health for wound care. - Discontinue Home Health Other Home Health Orders/Instructions: - Wound Care by [facility] at least twice a week.-D/C'd due to no insurance authorization (09/28/23) Radiology ordered were: X-ray, foot right - 2 views of the right foot right due to nonhealing wound on TMA at the 4th metatarsal CPT 73620 ICD10: E11.621 : Laboratory ordered were: Anaerobic culture - PCR swab of nonhealing wound to right foot ICD10: E11.621 The following medication(s) was prescribed: lidocaine topical 4 % cream cream topical was prescribed at facility WOUND #5: - Amputation Site - Transmetatarsal Wound Laterality: Right Cleanser: Normal Saline (Generic) Every Other Day/30 Days Discharge Instructions: use to slightly moisten Endoform before placing in the wound bed Cleanser: Soap and Water Every Other Day/30 Days Discharge Instructions: May shower and wash wound with dial antibacterial soap and water prior to dressing change. Cleanser: Vashe 5.8 (oz) Every Other Day/30 Days Discharge Instructions: Cleanse  the wound with Vashe prior to applying a clean dressing using gauze sponges, not tissue or cotton balls. Cleanser: Wound Cleanser Every Other Day/30 Days Discharge Instructions: Cleanse the wound with wound cleanser prior to applying a clean dressing using gauze sponges, not tissue or cotton balls. Prim Dressing: Maxorb Extra Ag+ Alginate Dressing, 2x2 (in/in) (DME) (Generic) Every Other Day/30 Days ary Discharge Instructions: Apply to wound bed as instructed Secondary Dressing: ABD Pad, 5x9  (Generic) Every Other Day/30 Days Discharge Instructions: Apply over primary dressing as directed. Secondary Dressing: Woven Gauze Sponge, Non-Sterile 4x4 in (Generic) Every Other Day/30 Days Discharge Instructions: Apply over primary dressing as directed. Secured With: [employer], 4.5x3.1 (in/yd) (Generic) Every Other Day/30 Days Discharge Instructions: Secure with Kerlix as directed. Secured With: Paper T ape, 2x10 (in/yd) (Generic) Every Other Day/30 Days Discharge Instructions: Secure dressing with tape as directed. 1. We continued with silver alginate as a primary dressing #2 awaiting results of swab PCR culture which I will likely address with topical and systemic antibiotics 3. X-ray of the right foot to compare with 1 done in July Addendum 12/10; PCR culture showed high concentrations of Enterobacter. No resistance genes. He is allergic to Bactrim. E scribed cefdinir 300 twice daily x 2 weeks Electronic Signature(s) Signed: 10/27/2023 5:17:16 PM By: Baltazar Najjar MD Previous Signature: 10/20/2023 4:45:28 AM Version By: Baltazar Najjar MD Entered By: Baltazar Najjar on 10/25/2023 07:11:27 -------------------------------------------------------------------------------- SuperBill Details Patient Name: Date of Service: [name] 10/19/2023 Medical Record Number: [id number] Patient Account Number: [id number] Date of Birth/Sex: Treating RN: [birth year] (82 y.o. Male) Primary Care Provider: [name] Other Clinician: Referring Provider: Treating Provider/Extender: Erasmo Downer in Treatment: 14 Diagnosis Coding ICD-10 Codes Code Description E11.621 Type 2 diabetes mellitus with foot ulcer L97.518 Non-pressure chronic ulcer of other part of right foot with other specified severity E11.51 Type 2 diabetes mellitus with diabetic peripheral angiopathy without gangrene E11.42 Type 2 diabetes mellitus with diabetic polyneuropathy Facility Procedures :  BRYLAN, DEC Code: [id number] NNINGS S ([id number] E L Description: 11042 - DEB SUBQ TISSUE 20 SQ CM/< ICD-10 Diagnosis Description ) [phone] 11.621 Type 2 diabetes mellitus with foot ulcer 97.518 Non-pressure chronic ulcer of other part of right foot with other specified sever Modifier: [id number] ity Quantity: 1 7.pdf Page 9 of 9 Physician Procedures : CPT4 Code Description Modifier [id number] 11042 - WC PHYS SUBQ TISS 20 SQ CM ICD-10 Diagnosis Description E11.621 Type 2 diabetes mellitus with foot ulcer L97.518 Non-pressure chronic ulcer of other part of right foot with other specified severity Quantity: 1 Electronic Signature(s) Signed: 10/20/2023 4:45:28 AM By: Baltazar Najjar MD Entered By: Baltazar Najjar on 10/19/2023 09:25:17

## 2023-10-20 NOTE — Progress Notes (Signed)
KYSEEM, LYNSKEY ([id number]) [id number] Page 1 of 8 Visit Report for 10/19/2023 Arrival Information Details Patient Name: Date of Service: Jason [name], Jason [name] 10/19/2023 11:00 Jason [name] Medical Record Number: [id number] Patient Account Number: [id number] Date of Birth/Sex: Treating RN: 09/22/1941 (82 y.o. Jason [name] Primary Care Jason [name]: Jason [name] Other Clinician: Referring Jason [name]: Treating Jason [name]/Extender: Jason [name] in Treatment: 14 Visit Information History Since Last Visit Added or deleted any medications: No Patient Arrived: Jason [name] Any new allergies or adverse reactions: No Arrival Time: 10:44 Had Jason fall or experienced change in No Accompanied By: wife activities of daily living that may affect Transfer Assistance: Manual risk of falls: Patient Identification Verified: Yes Signs or symptoms of abuse/neglect since last visito No Secondary Verification Process Completed: Yes Hospitalized since last visit: No Patient Has Alerts: Yes Implantable device outside of the clinic excluding No Patient Alerts: ABI R 1.46 (03/29/23) cellular tissue based products placed in the center ABI L 1.63 (03/29/23) since last visit: Has Dressing in Place as Prescribed: Yes Pain Present Now: No Electronic Signature(s) Signed: 10/19/2023 4:04:24 PM By: Samuella [name] Entered By: Samuella [name] on 10/19/2023 10:45:53 -------------------------------------------------------------------------------- Encounter Discharge Information Details Patient Name: Date of Service: Jason Milian S. 10/19/2023 11:00 Jason [name] Medical Record Number: [id number] Patient Account Number: [id number] Date of Birth/Sex: Treating RN: December 29, 1940 (82 y.o. Jason [name] Primary Care Jason [name]: Jason [name] Other Clinician: Referring Jason [name]: Treating Jason [name]/Extender: Jason [name] in Treatment: 14 Encounter Discharge Information Items Post  Procedure Vitals Discharge Condition: Stable Temperature (F): 98.5 Ambulatory Status: Walker Pulse (bpm): 58 Discharge Destination: Home Respiratory Rate (breaths/min): 18 Transportation: Private Auto Blood Pressure (mmHg): 108/56 Accompanied By: wife Schedule Follow-up Appointment: Yes Clinical Summary of Care: Patient Declined Electronic Signature(s) Signed: 10/19/2023 4:04:24 PM By: Samuella [name] Entered By: Samuella [name] on 10/19/2023 11:17:18 Piech, Jason [name] ([id number]) [id number] Page 2 of 8 -------------------------------------------------------------------------------- Lower Extremity Assessment Details Patient Name: Date of Service: Jason [name], Jason [name] 10/19/2023 11:00 Jason [name] Medical Record Number: [id number] Patient Account Number: [id number] Date of Birth/Sex: Treating RN: 06-20-1941 (82 y.o. Jason [name] Primary Care Evania Lyne: Jason [name] Other Clinician: Referring Jason [name]: Treating Jason [name]/Extender: Jason [name] in Treatment: 14 Edema Assessment Assessed: Jason [name]: No] Franne Forts: No] Edema: [Left: N] [Right: o] Calf Left: Right: Point of Measurement: 38 cm From Medial Instep 42.5 cm Ankle Left: Right: Point of Measurement: 11 cm From Medial Instep 27.5 cm Vascular Assessment Extremity colors, hair growth, and conditions: Extremity Color: [Right:Pale] Hair Growth on Extremity: [Right:No] Temperature of Extremity: [Right:Warm] Dependent Rubor: [Right:No No] Electronic Signature(s) Signed: 10/19/2023 4:04:24 PM By: Samuella [name] Entered By: Samuella [name] on 10/19/2023 10:46:12 -------------------------------------------------------------------------------- Multi Wound Chart Details Patient Name: Date of Service: Jason Milian S. 10/19/2023 11:00 Jason [name] Medical Record Number: [id number] Patient Account Number: [id number] Date of Birth/Sex: Treating RN: Dec 31, 1940 (82 y.o. [name]) Primary Care Keyonna Comun[place]:  Jason [name] Other Clinician: Referring Jason [name]: Treating Jason [name]/Extender: Jason [name] in Treatment: 14 Vital Signs Height(in): 80 Pulse(bpm): 58 Weight(lbs): 280 Blood Pressure(mmHg): 108/56 Body Mass Index(BMI): 30.8 Temperature(F): 98.5 Respiratory Rate(breaths/min): 18 [5:Photos:] [[name]:[name]] Right Amputation Site - [name] [name] Wound Location: Transmetatarsal Gradually Appeared [name] [name] Wounding Event: Diabetic Wound/Ulcer of the Lower [name] [name] Primary Etiology: Extremity Sleep Apnea, Arrhythmia, Congestive [name] [name] Comorbid History: Heart Failure, Peripheral Arterial Disease, Peripheral Venous Disease, Type II Diabetes, Neuropathy 04/19/2023 [name] [name] Date Acquired: 14 [name] [name] Weeks of Treatment: Open [name]  [name] Wound Status: No [name] [name] Wound Recurrence: 0.3x0.4x1 [name] [name] Measurements L x W x D (cm) 0.094 [name] [name] Jason (cm) : rea 0.094 [name] [name] Volume (cm) : 97.20% [name] [name] % Reduction in Jason rea: 93.00% [name] [name] % Reduction in Volume: 12 Starting Position 1 (o'clock): 12 Ending Position 1 (o'clock): 0.4 Maximum Distance 1 (cm): Yes [name] [name] Undermining: Grade 2 [name] [name] Classification: Medium [name] [name] Exudate Jason mount: Serosanguineous [name] [name] Exudate Type: red, brown [name] [name] Exudate Color: Distinct, outline attached [name] [name] Wound Margin: Medium (34-66%) [name] [name] Granulation Jason mount: Red [name] [name] Granulation Quality: Medium (34-66%) [name] [name] Necrotic Jason mount: Eschar [name] [name] Necrotic Tissue: Fat Layer (Subcutaneous Tissue): Yes [name] [name] Exposed Structures: Fascia: No Tendon: No Muscle: No Joint: No Bone: No Small (1-33%) [name] [name] Epithelialization: Debridement - Excisional [name] [name] Debridement: Pre-procedure Verification/Time Out 10:56 [name] [name] Taken: Lidocaine 4% Topical Solution [name] [name] Pain Control: Callus, Subcutaneous [name] [name] Tissue Debrided: Skin/Subcutaneous Tissue [name] [name] Level: 0.09 [name] [name] Debridement Jason (sq cm): rea Curette  [name] [name] Instrument: Minimum [name] [name] Bleeding: Pressure [name] [name] Hemostasis Jason chieved: Procedure was tolerated well [name] [name] Debridement Treatment Response: 0.3x0.4x1 [name] [name] Post Debridement Measurements L x W x D (cm) 0.094 [name] [name] Post Debridement Volume: (cm) Callus: Yes [name] [name] Periwound Skin Texture: Scarring: Yes Dry/Scaly: Yes [name] [name] Periwound Skin Moisture: No Abnormalities Noted [name] [name] Periwound Skin Color: No Abnormality [name] [name] Temperature: Debridement [name] [name] Procedures Performed: Treatment Notes Wound #5 (Amputation Site - Transmetatarsal) Wound Laterality: Right Cleanser Normal Saline Discharge Instruction: use to slightly moisten Endoform before placing in the wound bed Soap and Water Discharge Instruction: May shower and wash wound with dial antibacterial soap and water prior to dressing change. Vashe 5.8 (oz) Discharge Instruction: Cleanse the wound with Vashe prior to applying Jason clean dressing using gauze sponges, not tissue or cotton balls. Wound Cleanser Discharge Instruction: Cleanse the wound with wound cleanser prior to applying Jason clean dressing using gauze sponges, not tissue or cotton balls. Peri-Wound Care Topical Primary Dressing CASH, BRIERLEY ([id number]) [id number] Page 4 of 8 Maxorb Extra Ag+ Alginate Dressing, 2x2 (in/in) Discharge Instruction: Apply to wound bed as instructed Secondary Dressing ABD Pad, 5x9 Discharge Instruction: Apply over primary dressing as directed. Woven Gauze Sponge, Non-Sterile 4x4 in Discharge Instruction: Apply over primary dressing as directed. Secured With [employer], 4.5x3.1 (in/yd) Discharge Instruction: Secure with Kerlix as directed. Paper Tape, 2x10 (in/yd) Discharge Instruction: Secure dressing with tape as directed. Compression Wrap Compression Stockings Add-Ons Electronic Signature(s) Signed: 10/20/2023 4:45:28 AM By: Baltazar Najjar MD Entered By: Baltazar Najjar on 10/19/2023 12:19:47 -------------------------------------------------------------------------------- Multi-Disciplinary Care Plan Details Patient Name: Date of Service: Jason Milian S. 10/19/2023 11:00 Jason [name] Medical Record Number: [id number] Patient Account Number: [id number] Date of Birth/Sex: Treating RN: Feb 07, 1941 (82 y.o. Jason [name] Primary Care Kamron Portee: Jason [name] Other Clinician: Referring [place]: Treating Louie Meaders/Extender: Jason [name] in Treatment: 14 Active Inactive Wound/Skin Impairment Nursing Diagnoses: Impaired tissue integrity Goals: Patient/caregiver will verbalize understanding of skin care regimen Date Initiated: 07/13/2023 Target Resolution Date: 11/14/2023 Goal Status: Active Interventions: Assess ulceration(s) every visit Treatment Activities: Skin care regimen initiated : 07/13/2023 Notes: Electronic Signature(s) Signed: 10/19/2023 4:04:24 PM By: Samuella [name] Entered By: Samuella [name] on 10/19/2023 10:58:17 Rita, Jason [name] ([id number]) [id number] Page 5 of 8 -------------------------------------------------------------------------------- Pain Assessment Details Patient Name: Date of Service: Jason [name], Jason S. 10/19/2023 11:00 Jason  [name] Medical Record Number: [id number] Patient Account Number: [id number] Date of Birth/Sex: Treating RN: 23-Mar-1941 (82 y.o. Jason [name] Primary Care Shaquila Sigman: Jason [name] Other Clinician: Referring Jovon Winterhalter: Treating Gedalya Jim/Extender: Jason [name] in Treatment: 14 Active Problems Location of Pain Severity and Description of Pain Patient Has Paino No Site Locations Rate the pain. Current Pain Level: 0 Pain Management and Medication Current Pain Management: Electronic Signature(s) Signed: 10/19/2023 4:04:24 PM By: Samuella [name] Entered By: Samuella [name] on 10/19/2023  10:46:10 -------------------------------------------------------------------------------- Patient/Caregiver Education Details Patient Name: Date of Service: Jason [name] 12/4/2024andnbsp11:00 Jason [name] Medical Record Number: [id number] Patient Account Number: [id number] Date of Birth/Gender: Treating RN: May 29, 1941 (82 y.o. Jason [name] Primary Care Physician: Jason [name] Other Clinician: Referring Physician: Treating Physician/Extender: Jason [name] in Treatment: 14 Education Assessment Education Provided To: Patient Education Topics Provided Wound/Skin Impairment: Methods: Explain/Verbal Responses: Reinforcements needed, State content correctly Jason [name], Jason [name] ([id number]) [phone].pdf Page 6 of 8 Electronic Signature(s) Signed: 10/19/2023 4:04:24 PM By: Samuella [name] Entered By: Samuella [name] on 10/19/2023 10:58:27 -------------------------------------------------------------------------------- Wound Assessment Details Patient Name: Date of Service: Jason [name]. 10/19/2023 11:00 Jason [name] Medical Record Number: [id number] Patient Account Number: [id number] Date of Birth/Sex: Treating RN: December 31, 1940 (82 y.o. Jason [name] Primary Care Brax Walen: Jason [name] Other Clinician: Referring Vonetta Foulk: Treating Teandre Hamre/Extender: Jason [name] in Treatment: 14 Wound Status Wound Number: 5 Primary Diabetic Wound/Ulcer of the Lower Extremity Etiology: Wound Location: Right Amputation Site - Transmetatarsal Wound Open Wounding Event: Gradually Appeared Status: Date Acquired: 04/19/2023 Comorbid Sleep Apnea, Arrhythmia, Congestive Heart Failure, Peripheral Weeks Of Treatment: 14 History: Arterial Disease, Peripheral Venous Disease, Type II Diabetes, Clustered Wound: No Neuropathy Photos Wound Measurements Length: (cm) 0.3 Width: (cm) 0.4 Depth: (cm) 1 Area: (cm) 0.094 Volume: (cm) 0.094 %  Reduction in Area: 97.2% % Reduction in Volume: 93% Epithelialization: Small (1-33%) Undermining: Yes Starting Position (o'clock): 12 Ending Position (o'clock): 12 Maximum Distance: (cm) 0.4 Wound Description Classification: Grade 2 Wound Margin: Distinct, outline attached Exudate Amount: Medium Exudate Type: Serosanguineous Exudate Color: red, brown Foul Odor After Cleansing: No Slough/Fibrino Yes Wound Bed Granulation Amount: Medium (34-66%) Exposed Structure Granulation Quality: Red Fascia Exposed: No Necrotic Amount: Medium (34-66%) Fat Layer (Subcutaneous Tissue) Exposed: Yes Necrotic Quality: Eschar Tendon Exposed: No Muscle Exposed: No Joint Exposed: No Bone Exposed: No Periwound Skin Texture Texture Color No Abnormalities Noted: No No Abnormalities Noted: [address] Jason [name], Jason [name] ([id number]) [id number] Page 7 of 8 Callus: Yes Temperature / Pain Scarring: Yes Temperature: No Abnormality Moisture No Abnormalities Noted: No Dry / Scaly: Yes Treatment Notes Wound #5 (Amputation Site - Transmetatarsal) Wound Laterality: Right Cleanser Normal Saline Discharge Instruction: use to slightly moisten Endoform before placing in the wound bed Soap and Water Discharge Instruction: May shower and wash wound with dial antibacterial soap and water prior to dressing change. Vashe 5.8 (oz) Discharge Instruction: Cleanse the wound with Vashe prior to applying Jason clean dressing using gauze sponges, not tissue or cotton balls. Wound Cleanser Discharge Instruction: Cleanse the wound with wound cleanser prior to applying Jason clean dressing using gauze sponges, not tissue or cotton balls. Peri-Wound Care Topical Primary Dressing Maxorb Extra Ag+ Alginate Dressing, 2x2 (in/in) Discharge Instruction: Apply to wound bed as instructed Secondary Dressing ABD Pad, 5x9 Discharge Instruction: Apply over primary dressing as directed. Woven Gauze Sponge, Non-Sterile  4x4 in Discharge Instruction: Apply over primary dressing as directed. Secured With [employer], 4.5x3.1 (in/yd) Discharge  Instruction: Secure with Kerlix as directed. Paper Tape, 2x10 (in/yd) Discharge Instruction: Secure dressing with tape as directed. Compression Wrap Compression Stockings Add-Ons Electronic Signature(s) Signed: 10/19/2023 4:04:24 PM By: Samuella [name] Entered By: Samuella [name] on 10/19/2023 11:03:47 -------------------------------------------------------------------------------- Vitals Details Patient Name: Date of Service: Jason Milian S. 10/19/2023 11:00 Jason [name] Medical Record Number: [id number] Patient Account Number: [id number] Date of Birth/Sex: Treating RN: [birth year] (82 y.o. Jason [name] Primary Care Bryona Foxworthy: Jason [name] Other Clinician: Referring Oather Muil[place]: Treating Karlie Aung/Extender: Jason [name] in Treatment: 14 Vital Signs Time Taken: 10:45 Temperature (F): 98.5 Height (in): 80 Pulse (bpm): 58 Weight (lbs): 280 Respiratory Rate (breaths/min): 18 Body Mass Index (BMI): 30.8 Blood Pressure (mmHg): 108/56 Reference Range: 80 - 120 mg / dl Jason [name], Jason [name] ([id number]) [id number].pdf Page 8 of 8 Electronic Signature(s) Signed: 10/19/2023 4:04:24 PM By: Samuella [name] Entered By: Samuella [name] on 10/19/2023 10:46:30

## 2023-10-21 ENCOUNTER — Other Ambulatory Visit (HOSPITAL_COMMUNITY): Payer: Self-pay

## 2023-10-21 ENCOUNTER — Ambulatory Visit (HOSPITAL_COMMUNITY)
Admission: RE | Admit: 2023-10-21 | Discharge: 2023-10-21 | Disposition: A | Discharge status: Home / Self Care | Source: Ambulatory Visit | Attending: Internal Medicine | Admitting: Internal Medicine

## 2023-10-21 DIAGNOSIS — E1169 Type 2 diabetes mellitus with other specified complication: Secondary | ICD-10-CM | POA: Diagnosis present

## 2023-10-21 DIAGNOSIS — L97509 Non-pressure chronic ulcer of other part of unspecified foot with unspecified severity: Secondary | ICD-10-CM | POA: Diagnosis present

## 2023-10-21 DIAGNOSIS — E11621 Type 2 diabetes mellitus with foot ulcer: Secondary | ICD-10-CM | POA: Diagnosis present

## 2023-10-21 DIAGNOSIS — M869 Osteomyelitis, unspecified: Secondary | ICD-10-CM | POA: Insufficient documentation

## 2023-10-26 ENCOUNTER — Encounter (HOSPITAL_BASED_OUTPATIENT_CLINIC_OR_DEPARTMENT_OTHER): Admitting: Internal Medicine

## 2023-10-26 DIAGNOSIS — E11621 Type 2 diabetes mellitus with foot ulcer: Secondary | ICD-10-CM | POA: Diagnosis not present

## 2023-10-27 ENCOUNTER — Ambulatory Visit: Payer: Medicare HMO | Admitting: Podiatry

## 2023-10-27 NOTE — Progress Notes (Signed)
[name], [name] ([id number]) [id number] Page 1 of 9 Visit Report for 10/26/2023 Arrival Information Details Patient Name: Date of Service: [name], [name] 10/26/2023 1:15 PM Medical Record Number: [id number] Patient Account Number: [id number] Date of Birth/Sex: Treating RN: 05/19/1941 (82 y.o. Male) Shawn Stall Primary Care Yandriel Boening: Jason [name] Other Clinician: Referring Deanthony Maull: Treating Miara Emminger/Extender: Erasmo Downer in Treatment: 15 Visit Information History Since Last Visit All ordered tests and consults were completed: Yes Patient Arrived: Jason [name] Added or deleted any medications: No Arrival Time: 13:25 Any new allergies or adverse reactions: No Accompanied By: wife Had a fall or experienced change in No Transfer Assistance: None activities of daily living that may affect Patient Identification Verified: Yes risk of falls: Secondary Verification Process Completed: Yes Signs or symptoms of abuse/neglect since No Patient Requires Transmission-Based Precautions: No last visito Patient Has Alerts: Yes Hospitalized since last visit: No Patient Alerts: ABI R 1.46 (03/29/23) Implantable device outside of the clinic No ABI L 1.63 (03/29/23) excluding cellular tissue based products placed in the center since last visit: Has Dressing in Place as Prescribed: Yes Has Footwear/Offloading in Place as Yes Prescribed: Left: Surgical Shoe with Pressure Relief Insole Right: Surgical Shoe with Pressure Relief Insole Pain Present Now: No Electronic Signature(s) Signed: 10/26/2023 5:45:54 PM By: Shawn Stall RN, BSN Entered By: Shawn Stall on 10/26/2023 13:26:03 -------------------------------------------------------------------------------- Clinic Level of Care Assessment Details Patient Name: Date of Service: Jason [name]. 10/26/2023 1:15 PM Medical Record Number: [id number] Patient Account Number: [id number] Date of  Birth/Sex: Treating RN: 27-Dec-1940 (82 y.o. Male) Shawn Stall Primary Care Aara Jacquot: Jason [name] Other Clinician: Referring Aayushi Solorzano: Treating Mikisha Roseland/Extender: Erasmo Downer in Treatment: 15 Clinic Level of Care Assessment Items TOOL 4 Quantity Score X- 1 0 Use when only an EandM is performed on FOLLOW-UP visit ASSESSMENTS - Nursing Assessment / Reassessment X- 1 10 Reassessment of Co-morbidities (includes updates in patient status) X- 1 5 Reassessment of Adherence to Treatment Plan ASSESSMENTS - Wound and Skin A ssessment / Reassessment X - Simple Wound Assessment / Reassessment - one wound 1 5 []  - 0 Complex Wound Assessment / Reassessment - multiple wounds ETAN, CORIELL ([id number]) [phone] Page 2 of 9 []  - 0 Dermatologic / Skin Assessment (not related to wound area) ASSESSMENTS - Focused Assessment X- 1 5 Circumferential Edema Measurements - multi extremities []  - 0 Nutritional Assessment / Counseling / Intervention []  - 0 Lower Extremity Assessment (monofilament, tuning fork, pulses) []  - 0 Peripheral Arterial Disease Assessment (using hand held doppler) ASSESSMENTS - Ostomy and/or Continence Assessment and Care []  - 0 Incontinence Assessment and Management []  - 0 Ostomy Care Assessment and Management (repouching, etc.) PROCESS - Coordination of Care X - Simple Patient / Family Education for ongoing care 1 15 []  - 0 Complex (extensive) Patient / Family Education for ongoing care X- 1 10 Staff obtains Chiropractor, Records, T Results / Process Orders est []  - 0 Staff telephones HHA, Nursing Homes / Clarify orders / etc []  - 0 Routine Transfer to another Facility (non-emergent condition) []  - 0 Routine Hospital Admission (non-emergent condition) []  - 0 New Admissions / Manufacturing engineer / Ordering NPWT Apligraf, etc. , []  - 0 Emergency Hospital Admission (emergent condition) X- 1 10 Simple Discharge  Coordination []  - 0 Complex (extensive) Discharge Coordination PROCESS - Special Needs []  - 0 Pediatric / Minor Patient Management []  - 0 Isolation Patient Management []  - 0 Hearing / Language / Visual special  needs []  - 0 Assessment of Community assistance (transportation, D/C planning, etc.) []  - 0 Additional assistance / Altered mentation []  - 0 Support Surface(s) Assessment (bed, cushion, seat, etc.) INTERVENTIONS - Wound Cleansing / Measurement X - Simple Wound Cleansing - one wound 1 5 []  - 0 Complex Wound Cleansing - multiple wounds X- 1 5 Wound Imaging (photographs - any number of wounds) []  - 0 Wound Tracing (instead of photographs) X- 1 5 Simple Wound Measurement - one wound []  - 0 Complex Wound Measurement - multiple wounds INTERVENTIONS - Wound Dressings []  - 0 Small Wound Dressing one or multiple wounds X- 1 15 Medium Wound Dressing one or multiple wounds []  - 0 Large Wound Dressing one or multiple wounds X- 1 5 Application of Medications - topical []  - 0 Application of Medications - injection INTERVENTIONS - Miscellaneous []  - 0 External ear exam []  - 0 Specimen Collection (cultures, biopsies, blood, body fluids, etc.) []  - 0 Specimen(s) / Culture(s) sent or taken to Lab for analysis []  - 0 Patient Transfer (multiple staff / Teddy Spike / Similar devices) HEINZ, FAGO ([id number]) [id number] Page 3 of 9 []  - 0 Simple Staple / Suture removal (25 or less) []  - 0 Complex Staple / Suture removal (26 or more) []  - 0 Hypo / Hyperglycemic Management (close monitor of Blood Glucose) []  - 0 Ankle / Brachial Index (ABI) - do not check if billed separately X- 1 5 Vital Signs Has the patient been seen at the hospital within the last three years: Yes Total Score: 100 Level Of Care: New/Established - Level 3 Electronic Signature(s) Signed: 10/26/2023 5:45:54 PM By: Shawn Stall RN, BSN Entered By: Shawn Stall on  10/26/2023 13:55:32 -------------------------------------------------------------------------------- Encounter Discharge Information Details Patient Name: Date of Service: Jason [name]. 10/26/2023 1:15 PM Medical Record Number: [id number] Patient Account Number: [id number] Date of Birth/Sex: Treating RN: Sep 05, 1941 (82 y.o. Male) Shawn Stall Primary Care Cristel Rail: Jason [name] Other Clinician: Referring Clanton Emanuelson: Treating Dayleen Beske/Extender: Erasmo Downer in Treatment: 15 Encounter Discharge Information Items Discharge Condition: Stable Ambulatory Status: Cane Discharge Destination: Home Transportation: Private Auto Accompanied By: wife Schedule Follow-up Appointment: Yes Clinical Summary of Care: Electronic Signature(s) Signed: 10/26/2023 5:45:54 PM By: Shawn Stall RN, BSN Entered By: Shawn Stall on 10/26/2023 13:56:02 -------------------------------------------------------------------------------- Lower Extremity Assessment Details Patient Name: Date of Service: Jason [name]. 10/26/2023 1:15 PM Medical Record Number: [id number] Patient Account Number: [id number] Date of Birth/Sex: Treating RN: 1941-06-07 (82 y.o. Male) Shawn Stall Primary Care Wilbur Oakland: Jason [name] Other Clinician: Referring Karthika Glasper: Treating Daquann Merriott/Extender: Marya Amsler Weeks in Treatment: 15 Edema Assessment Assessed: Kyra Searles: No] [Right: Yes] Edema: [Left: N] [Right: o] Calf Left: Right: Point of Measurement: 38 cm From Medial Instep 39 cm Ankle TARREN, DENTINO ([id number]) [id number] Page 4 of 9 Left: Right: Point of Measurement: 11 cm From Medial Instep 26.5 cm Vascular Assessment Pulses: Dorsalis Pedis Palpable: [Right:Yes] Extremity colors, hair growth, and conditions: Extremity Color: [Right:Pale] Hair Growth on Extremity: [Right:No] Temperature of Extremity: [Right:Warm] Dependent Rubor: [Right:No] Blanched when  Elevated: [Right:No Yes] Electronic Signature(s) Signed: 10/26/2023 5:45:54 PM By: Shawn Stall RN, BSN Entered By: Shawn Stall on 10/26/2023 13:31:05 -------------------------------------------------------------------------------- Multi Wound Chart Details Patient Name: Date of Service: Jason [name]. 10/26/2023 1:15 PM Medical Record Number: [id number] Patient Account Number: [id number] Date of Birth/Sex: Treating RN: Apr 13, 1941 (82 y.o. Male) Primary Care Henrick Mcgue: Jason [name] Other Clinician: Referring Perkins Molina: Treating Loris Winrow/Extender: Marya Amsler  Weeks in Treatment: 15 Vital Signs Height(in): 80 Capillary Blood Glucose(mg/dl): 413 Weight(lbs): 244 Pulse(bpm): 63 Body Mass Index(BMI): 30.8 Blood Pressure(mmHg): 114/70 Temperature(F): 98 Respiratory Rate(breaths/min): 20 [5:Photos:] [N/A:N/A] Right Amputation Site - N/A N/A Wound Location: Transmetatarsal Gradually Appeared N/A N/A Wounding Event: Diabetic Wound/Ulcer of the Lower N/A N/A Primary Etiology: Extremity Sleep Apnea, Arrhythmia, Congestive N/A N/A Comorbid History: Heart Failure, Peripheral Arterial Disease, Peripheral Venous Disease, Type II Diabetes, Neuropathy 04/19/2023 N/A N/A Date Acquired: 15 N/A N/A Weeks of Treatment: Open N/A N/A Wound Status: No N/A N/A Wound Recurrence: 0.6x0.7x0.9 N/A N/A Measurements L x W x D (cm) 0.33 N/A N/A A (cm) : rea 0.297 N/A N/A Volume (cm) : 90.10% N/A N/A % Reduction in A rea: 77.80% N/A N/A % Reduction in Volume: 12 Starting Position 1 (o'clock): 12 Ending Position 1 (o'clock): MACLEAN, WAS ([id number]) [id number] Page 5 of 9 0.6 Maximum Distance 1 (cm): Yes N/A N/A Undermining: Grade 2 N/A N/A Classification: Medium N/A N/A Exudate A mount: Serosanguineous N/A N/A Exudate Type: red, brown N/A N/A Exudate Color: Well defined, not attached N/A N/A Wound Margin: Large (67-100%) N/A  N/A Granulation A mount: Red N/A N/A Granulation Quality: Small (1-33%) N/A N/A Necrotic A mount: Eschar N/A N/A Necrotic Tissue: Fat Layer (Subcutaneous Tissue): Yes N/A N/A Exposed Structures: Fascia: No Tendon: No Muscle: No Joint: No Bone: No Small (1-33%) N/A N/A Epithelialization: Callus: Yes N/A N/A Periwound Skin Texture: Scarring: Yes Dry/Scaly: Yes N/A N/A Periwound Skin Moisture: No Abnormalities Noted N/A N/A Periwound Skin Color: No Abnormality N/A N/A Temperature: Treatment Notes Wound #5 (Amputation Site - Transmetatarsal) Wound Laterality: Right Cleanser Normal Saline Discharge Instruction: use to slightly moisten Endoform before placing in the wound bed Soap and Water Discharge Instruction: May shower and wash wound with dial antibacterial soap and water prior to dressing change. Vashe 5.8 (oz) Discharge Instruction: Cleanse the wound with Vashe prior to applying a clean dressing using gauze sponges, not tissue or cotton balls. Wound Cleanser Discharge Instruction: Cleanse the wound with wound cleanser prior to applying a clean dressing using gauze sponges, not tissue or cotton balls. Peri-Wound Care Topical Gentamicin Discharge Instruction: As directed by physician directly to wound bed. Primary Dressing Maxorb Extra Ag+ Alginate Dressing, 2x2 (in/in) Discharge Instruction: Apply to wound bed as instructed Secondary Dressing ABD Pad, 5x9 Discharge Instruction: Apply over primary dressing as directed. Woven Gauze Sponge, Non-Sterile 4x4 in Discharge Instruction: Apply over primary dressing as directed. Secured With [employer], 4.5x3.1 (in/yd) Discharge Instruction: Secure with Kerlix as directed. Paper Tape, 2x10 (in/yd) Discharge Instruction: Secure dressing with tape as directed. Compression Wrap Compression Stockings Add-Ons Electronic Signature(s) Signed: 10/26/2023 5:18:28 PM By: Baltazar Najjar MD Entered By: Baltazar Najjar  on 10/26/2023 13:59:04 Hurston, Winifred Olive ([id number]) [id number] Page 6 of 9 -------------------------------------------------------------------------------- Multi-Disciplinary Care Plan Details Patient Name: Date of Service: ARIANO, INDA 10/26/2023 1:15 PM Medical Record Number: [id number] Patient Account Number: [id number] Date of Birth/Sex: Treating RN: 10-12-1941 (82 y.o. Male) Shawn Stall Primary Care Elhadj Girton: Jason [name] Other Clinician: Referring Vandella Ord: Treating Demika Langenderfer/Extender: Erasmo Downer in Treatment: 15 Active Inactive Wound/Skin Impairment Nursing Diagnoses: Impaired tissue integrity Goals: Patient/caregiver will verbalize understanding of skin care regimen Date Initiated: 07/13/2023 Target Resolution Date: 12/16/2023 Goal Status: Active Interventions: Assess ulceration(s) every visit Treatment Activities: Skin care regimen initiated : 07/13/2023 Notes: Electronic Signature(s) Signed: 10/26/2023 5:45:54 PM By: Shawn Stall RN, BSN Entered By: Shawn Stall on 10/26/2023 13:37:28 -------------------------------------------------------------------------------- Pain Assessment  Details Patient Name: Date of Service: GENE, WEATHERBY 10/26/2023 1:15 PM Medical Record Number: [id number] Patient Account Number: [id number] Date of Birth/Sex: Treating RN: 31-Oct-1941 (82 y.o. Male) Shawn Stall Primary Care Jeanne Diefendorf: Jason [name] Other Clinician: Referring Delanee Xin: Treating Halana Deisher/Extender: Erasmo Downer in Treatment: 15 Active Problems Location of Pain Severity and Description of Pain Patient Has Paino No Site Locations Rate the pain. SIRMICHAEL, LORENSON ([id number]) [id number] Page 7 of 9 Rate the pain. Current Pain Level: 0 Pain Management and Medication Current Pain Management: Electronic Signature(s) Signed: 10/26/2023 5:45:54 PM By: Shawn Stall RN,  BSN Entered By: Shawn Stall on 10/26/2023 13:26:45 -------------------------------------------------------------------------------- Patient/Caregiver Education Details Patient Name: Date of Service: Jason [name] 12/11/2024andnbsp1:15 PM Medical Record Number: [id number] Patient Account Number: [id number] Date of Birth/Gender: Treating RN: 1941/06/25 (82 y.o. Male) Shawn Stall Primary Care Physician: Jason [name] Other Clinician: Referring Physician: Treating Physician/Extender: Erasmo Downer in Treatment: 15 Education Assessment Education Provided To: Patient Education Topics Provided Wound/Skin Impairment: Handouts: Caring for Your Ulcer Methods: Explain/Verbal Responses: Reinforcements needed Electronic Signature(s) Signed: 10/26/2023 5:45:54 PM By: Shawn Stall RN, BSN Entered By: Shawn Stall on 10/26/2023 13:37:38 -------------------------------------------------------------------------------- Wound Assessment Details Patient Name: Date of Service: Jason [name] 10/26/2023 1:15 PM Medical Record Number: [id number] Patient Account Number: [id number] Date of Birth/Sex: Treating RN: [birth year] (82 y.o. Male) Shawn Stall Primary Care Jaice Digioia: Jason [name] Other Clinician: KUAN, RAMA ([id number]) [id number] Page 8 of 9 Referring Jarmarcus Wambold: Treating Jerrad Mendibles/Extender: Erasmo Downer in Treatment: 15 Wound Status Wound Number: 5 Primary Diabetic Wound/Ulcer of the Lower Extremity Etiology: Wound Location: Right Amputation Site - Transmetatarsal Wound Open Wounding Event: Gradually Appeared Status: Date Acquired: 04/19/2023 Comorbid Sleep Apnea, Arrhythmia, Congestive Heart Failure, Peripheral Weeks Of Treatment: 15 History: Arterial Disease, Peripheral Venous Disease, Type II Diabetes, Clustered Wound: No Neuropathy Photos Wound Measurements Length: (cm) 0.6 Width: (cm) 0.7 Depth: (cm)  0.9 Area: (cm) 0.33 Volume: (cm) 0.297 % Reduction in Area: 90.1% % Reduction in Volume: 77.8% Epithelialization: Small (1-33%) Tunneling: No Undermining: Yes Starting Position (o'clock): 12 Ending Position (o'clock): 12 Maximum Distance: (cm) 0.6 Wound Description Classification: Grade 2 Wound Margin: Well defined, not attached Exudate Amount: Medium Exudate Type: Serosanguineous Exudate Color: red, brown Foul Odor After Cleansing: No Slough/Fibrino Yes Wound Bed Granulation Amount: Large (67-100%) Exposed Structure Granulation Quality: Red Fascia Exposed: No Necrotic Amount: Small (1-33%) Fat Layer (Subcutaneous Tissue) Exposed: Yes Necrotic Quality: Eschar Tendon Exposed: No Muscle Exposed: No Joint Exposed: No Bone Exposed: No Periwound Skin Texture Texture Color No Abnormalities Noted: No No Abnormalities Noted: Yes Callus: Yes Temperature / Pain Scarring: Yes Temperature: No Abnormality Moisture No Abnormalities Noted: No Dry / Scaly: Yes Treatment Notes Wound #5 (Amputation Site - Transmetatarsal) Wound Laterality: Right Cleanser Normal Saline Discharge Instruction: use to slightly moisten Endoform before placing in the wound bed Soap and Water Discharge Instruction: May shower and wash wound with dial antibacterial soap and water prior to dressing change. Vashe 5.8 (oz) Discharge Instruction: Cleanse the wound with Vashe prior to applying a clean dressing using gauze sponges, not tissue or cotton balls. OSMANI, LACLAIR ([id number]) [id number] Page 9 of 9 Wound Cleanser Discharge Instruction: Cleanse the wound with wound cleanser prior to applying a clean dressing using gauze sponges, not tissue or cotton balls. Peri-Wound Care Topical Gentamicin Discharge Instruction: As directed by physician directly to wound bed. Primary Dressing Maxorb Extra Ag+ Alginate Dressing, 2x2 (in/in) Discharge  Instruction: Apply to wound bed as  instructed Secondary Dressing ABD Pad, 5x9 Discharge Instruction: Apply over primary dressing as directed. Woven Gauze Sponge, Non-Sterile 4x4 in Discharge Instruction: Apply over primary dressing as directed. Secured With [employer], 4.5x3.1 (in/yd) Discharge Instruction: Secure with Kerlix as directed. Paper Tape, 2x10 (in/yd) Discharge Instruction: Secure dressing with tape as directed. Compression Wrap Compression Stockings Add-Ons Electronic Signature(s) Signed: 10/26/2023 5:45:54 PM By: Shawn Stall RN, BSN Entered By: Shawn Stall on 10/26/2023 13:51:42 -------------------------------------------------------------------------------- Vitals Details Patient Name: Date of Service: Jason [name]. 10/26/2023 1:15 PM Medical Record Number: [id number] Patient Account Number: [id number] Date of Birth/Sex: Treating RN: [birth year] (82 y.o. Male) Shawn Stall Primary Care Ivee Poellnitz: Jason [name] Other Clinician: Referring Kourtni Stineman: Treating Robb Sibal/Extender: Erasmo Downer in Treatment: 15 Vital Signs Time Taken: 13:30 Temperature (F): 98 Height (in): 80 Pulse (bpm): 63 Weight (lbs): 280 Respiratory Rate (breaths/min): 20 Body Mass Index (BMI): 30.8 Blood Pressure (mmHg): 114/70 Capillary Blood Glucose (mg/dl): 132 Reference Range: 80 - 120 mg / dl Electronic Signature(s) Signed: 10/26/2023 5:45:54 PM By: Shawn Stall RN, BSN Entered By: Shawn Stall on 10/26/2023 13:31:34

## 2023-10-27 NOTE — Progress Notes (Signed)
Jason [name], Jason [name] ([id number]) [id number] Page 1 of 7 Visit Report for 10/26/2023 HPI Details Patient Name: Date of Service: Jason [name], Jason [name] 10/26/2023 1:15 PM Medical Record Number: [id number] Patient Account Number: [id number] Date of Birth/Sex: Treating RN: 06-Apr-1941 (82 y.o. Male) Primary Care Provider: [name] Other Clinician: Referring Provider: Treating Provider/Extender: Erasmo Downer in Treatment: 15 History of Present Illness Chronic/Inactive Conditions Condition 1: He was followed for a period of time by Dr. Durwin Nora of vein and vascular as far as I can see last saw him on 1//24. 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 Last arterial studies I see showed noncompressible ABIs bilaterally but multiphasic waveforms on the right but a TBI of 0.44 on the left HPI Description: 01/31/17; this is a 82 year old man who has a history of type 2 diabetes on oral agents. He has a history of wound difficulties however this is on his right foot in fact he has had amputations of his medial 3 toes. I did not actually look at the right foot today. He tells me about a month ago he was cutting his mycotic nails informed a wound on the medial aspect of the left great toe. He saw Dr. Logan Bores of podiatry. He has been using Neosporin for a while and now simply wrapping it with gauze. He states he does have peripheral neuropathy. His hemoglobin A1c in this clinic was 1. 02/21/1989 non-compressible on the left. He did have vascular studies done in 2015 showing ABIs of 1.4 bilaterally, TBI's of 1.0 on the right 1.1 on the left with triphasic waveforms. He has not had imaging studies that I'm aware of 02/07/17; this is a 82 year old man with type 2 diabetes on oral agents. [place] with a small open area on the medial  aspect of his left great toe. This is in the setting of a significant mycotic nail for which he follows with Dr. Logan Bores of podiatry READMISSION 07/13/2023 This is a now 82 year old man who is had problems with a wound on his right foot amputation site after undergoing a transmetatarsal amputation in May of this year. He was admitted to hospital from 03/29/2023 through 04/02/2023 with sepsis felt to be secondary to an infected wound I think on the right first metatarsal head he underwent a transmetatarsal amputation by podiatry in the hospital. A culture of the wound had previously shown Klebsiella. He was seen in follow-up in the office on 04/19/2023 with a postop wound infection and was given doxycycline and Augmentin for 14 days. He has been dressing the wounds with Betadine wet-to-dry. Looking through epic it would appear that the follow-up with the podiatry group has been sporadic. The patient tells me he was at [facility] skilled facility although he is back at home now. His wound now is actually probably between the fourth and fifth metatarsal head on the amputation site. He has a smaller wound on the dorsal foot in this area and I am sure at 1 time these connected but I could not prove this today. He just came in with kerlix and dry gauze on the wound. He was followed for a period of time by Dr. Durwin Nora of vein and vascular as far as I can see last saw him on 1//24. At that time he was felt to have mild infrainguinal arterial occlusive disease  but had multiphasic signals in both feet which should have been sufficient to heal wounds. Last arterial studies I see showed noncompressible ABIs bilaterally but multiphasic waveforms on the right but a TBI of 0.44 on the left Past medical history includes type 2 diabetes with peripheral neuropathy and PAD, diastolic heart failure, mitral valve stenosis, atrial fibrillation on Xarelto, sleep apnea ABIs were not repeated today in the clinic as they are  previously noncompressible as noted His MRI is listed below; This did not suggest osteomyelitis in the area of the right foot where his wound is currently located MRI 03/29/23 IMPRESSION: 1. Deep skin wound about the plantar aspect of the first metatarsal head with mild marrow edema about the plantar aspect of the first metatarsal head concerning for osteomyelitis. 2. No fluid collection or abscess. 3. Postsurgical changes with prior amputation of the right foot through the metatarsophalangeal joints of the first and fifth digit and through metatarsal heads of the second through fourth digits. 4. Edema of the plantar muscles and tendons suggesting diabetic myopathy/myositis. 07-20-2023 upon evaluation today patient presents for follow-up evaluation here in the clinic. I did review his note from the last visit with Dr. Leanord Hawking. With that being said he is a current patient of vein and vascular here in [place] and has seen them last it looks like according to Dr. Jannetta Quint note in January of this year. At that point he had multiphasic waveforms on the right with a TBI of 0.44 on the left. I do not see any recent x-rays currently although he has seen podiatry I cannot see any of those x-rays. I really think having him go for a x-ray of the foot would be a good idea. 07-27-2023 upon evaluation today patient appears to be doing well currently in regard to his wound. He is actually showing signs of good improvement. I actually feel like this is significantly improved compared to last week's evaluation he does seem to have good granulation and epithelization and very pleased there is some callus I am going to need to remove however. 08-10-2023 upon evaluation today patient appears to be doing well currently in regard to his wound. The foot actually appears to be showing signs of excellent improvement and I am very pleased in that regard. Fortunately I do not see any evidence of worsening overall and I do  believe that the patient is making good headway towards closure. For now I am probably going to recommend that we continue with the [facility] although we may make a switch to collagen in the near future depending on how things progress. 08-24-2023 upon evaluation today patient appears to be doing well currently in regard to his wound. He has been tolerating the dressing changes I do believe it Jason [name], Jason [name] ([id number]) [id number] Page 2 of 7 may be good to switch over to endoform at this point he is in agreement with that plan. Fortunately I do not see any evidence of infection or worsening overall and I am pleased in that regard. 08-31-2023 upon evaluation today patient's wound bed actually showed signs of good granulation epithelization at this point. Fortunately I do not see any signs of infection he seems to be doing quite well based on what I am seeing today. 09-07-2023 upon evaluation today patient appears to be doing well currently in regard to his wound. Has been tolerating the dressing changes without complication. Fortunately I do not see any signs of active infection locally or systemically at this time which  is great news. No fevers, chills, nausea, vomiting, or diarrhea. 09-14-2023 upon evaluation today patient appears to be doing excellent in regard to his foot ulcer. This is getting very close to complete resolution. Fortunately I do not see any evidence of worsening overall and I do believe that the patient is making excellent headway towards complete closure. 11/6; this is a wound on the right TMA tip at the level roughly of the fourth metatarsal. Small wound with 0.5 cm of depth we have been using endoform ABDs and gauze. 11/13; right TMA at roughly the fourth metatarsal. The surface callus raised the possibility that there was healing a healed state here. We have been using endoform ABDs and gauze. 11/20; right TMA at roughly the fourth  metatarsal again comes in with surface callus. This requires debridement we have been using endoform ABDs and gauze this is being changed daily by a neighbor who has medical training 12/3; right TMA at roughly the fourth metatarsal head. Again a completely nonviable surface with callus dry skin and subcutaneous debris. I used a #3 curette to remove all of this. The wound cleans up quite nicely however it has a worrisome amount of depth. He is a type II diabetic. 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 Annamary Rummage. I am assuming this is the only area that remains. 12/11; patient is continually deteriorated with the wound on the plantar aspect of the roughly the right fourth metatarsal and his TMA site. On 11 6 this was down to 0.5 cm in depth it has gotten worse than this with undermining. PCR culture I did last time showed Enterobacter. No resistance genes were detected. We sent in cefdinir 300 twice daily for 10 days but this has been picked up yet. We are also going to use topical gentamicin Electronic Signature(s) Signed: 10/26/2023 5:18:28 PM By: Baltazar Najjar MD Entered By: Baltazar Najjar on 10/26/2023 11:00:47 -------------------------------------------------------------------------------- Physical Exam Details Patient Name: Date of Service: Jason Jobs. 10/26/2023 1:15 PM Medical Record Number: [id number] Patient Account Number: [id number] Date of Birth/Sex: Treating RN: [birth year] (82 y.o. Male) Primary Care Provider: [name] Other Clinician: Referring Provider: Treating Provider/Extender: Erasmo Downer in Treatment: 15 Constitutional Sitting or standing Blood Pressure is within target range for patient.. Pulse regular and within target range for patient.Marland Kitchen Respirations regular, non-labored and within target range.. Temperature is normal  and within the target range for the patient.Marland Kitchen Appears in no distress. Notes Wound exam; small punched-out area in the right lateral TMA however this is more impressive in size. 0.6 cm in depth 0.3 to 4 cm and undermining circumferentially. This does not go to bone there is no surrounding soft tissue infection Electronic Signature(s) Signed: 10/26/2023 5:18:28 PM By: Baltazar Najjar MD Entered By: Baltazar Najjar on 10/26/2023 11:01:54 -------------------------------------------------------------------------------- Physician Orders Details Patient Name: Date of Service: Jason Jobs. 10/26/2023 1:15 PM Medical Record Number: [id number] Patient Account Number: [id number] Date of Birth/Sex: Treating RN: [birth year] (82 y.o. Male) Shawn Stall Primary Care Provider: [name] Other Clinician: Referring Provider: Treating Provider/Extender: Jhon, Jude, Jason [name] ([id number]) [id number] Page 3 of 7 Weeks in Treatment: 15 The following information was scribed by: Shawn Stall The information was scribed for: Baltazar Najjar Verbal / Phone Orders: No Diagnosis Coding ICD-10 Coding Code Description E11.621 Type 2 diabetes mellitus with foot ulcer L97.518  Non-pressure chronic ulcer of other part of right foot with other specified severity E11.51 Type 2 diabetes mellitus with diabetic peripheral angiopathy without gangrene E11.42 Type 2 diabetes mellitus with diabetic polyneuropathy Follow-up Appointments ppointment in 1 week. - Dr. Leanord Hawking (already scheduled) 11/02/2023 115pm Return A Nurse Visit: - week of Christmas and new years (front office to schedule) Other: - Pick up oral and topical antibiotics from [facility] today. Anesthetic (In clinic) Topical Lidocaine 4% applied to wound bed - Used in Clinic Bathing/ Shower/ Hygiene May shower and wash wound with soap and water. - Please keep the wound dressing dry on the days the wound dressing  is not being changed Off-Loading Open toe surgical shoe to: - right foot surgical shoe Wound Treatment Wound #5 - Amputation Site - Transmetatarsal Wound Laterality: Right Cleanser: Normal Saline (Generic) Every Other Day/30 Days Discharge Instructions: use to slightly moisten Endoform before placing in the wound bed Cleanser: Soap and Water Every Other Day/30 Days Discharge Instructions: May shower and wash wound with dial antibacterial soap and water prior to dressing change. Cleanser: Vashe 5.8 (oz) Every Other Day/30 Days Discharge Instructions: Cleanse the wound with Vashe prior to applying a clean dressing using gauze sponges, not tissue or cotton balls. Cleanser: Wound Cleanser Every Other Day/30 Days Discharge Instructions: Cleanse the wound with wound cleanser prior to applying a clean dressing using gauze sponges, not tissue or cotton balls. Topical: Gentamicin Every Other Day/30 Days Discharge Instructions: As directed by physician directly to wound bed. Prim Dressing: Maxorb Extra Ag+ Alginate Dressing, 2x2 (in/in) (Generic) Every Other Day/30 Days ary Discharge Instructions: Apply to wound bed as instructed Secondary Dressing: ABD Pad, 5x9 (Generic) Every Other Day/30 Days Discharge Instructions: Apply over primary dressing as directed. Secondary Dressing: Woven Gauze Sponge, Non-Sterile 4x4 in (Generic) Every Other Day/30 Days Discharge Instructions: Apply over primary dressing as directed. Secured With: [employer], 4.5x3.1 (in/yd) (Generic) Every Other Day/30 Days Discharge Instructions: Secure with Kerlix as directed. Secured With: Paper Tape, 2x10 (in/yd) (Generic) Every Other Day/30 Days Discharge Instructions: Secure dressing with tape as directed. Patient Medications llergies: Bactrim A Notifications Medication Indication Start End wound infection 10/26/2023 gentamicin DOSE topical 0.1 % cream - cream topical once daily to wound with dressing changes  daily Electronic Signature(s) Signed: 10/26/2023 1:57:36 PM By: Baltazar Najjar MD Entered By: Baltazar Najjar on 10/26/2023 10:57:35 Jason [name], Jason [name] ([id number]) [id number] Page 4 of 7 -------------------------------------------------------------------------------- Problem List Details Patient Name: Date of Service: Jason [name], Jason [name] 10/26/2023 1:15 PM Medical Record Number: [id number] Patient Account Number: [id number] Date of Birth/Sex: Treating RN: [birth year] (82 y.o. Male) Shawn Stall Primary Care Provider: [name] Other Clinician: Referring Provider: Treating Provider/Extender: Erasmo Downer in Treatment: 15 Active Problems ICD-10 Encounter Code Description Active Date MDM Diagnosis E11.621 Type 2 diabetes mellitus with foot ulcer 07/13/2023 No Yes L97.518 Non-pressure chronic ulcer of other part of right foot with other specified 07/13/2023 No Yes severity E11.51 Type 2 diabetes mellitus with diabetic peripheral angiopathy without gangrene 07/13/2023 No Yes E11.42 Type 2 diabetes mellitus with diabetic polyneuropathy 07/13/2023 No Yes Inactive Problems Resolved Problems Electronic Signature(s) Signed: 10/26/2023 5:18:28 PM By: Baltazar Najjar MD Entered By: Baltazar Najjar on 10/26/2023 10:58:57 -------------------------------------------------------------------------------- Progress Note Details Patient Name: Date of Service: Jason Jobs. 10/26/2023 1:15 PM Medical Record Number: [id number] Patient Account Number: [id number] Date of Birth/Sex: Treating RN: 28-Oct-1941 (82 y.o. Male) Primary Care Provider: [name] Other Clinician: Referring Provider: Treating Provider/Extender: Leanord Hawking  Nani Skillern, Jason Fila Weeks in Treatment: 15 Subjective History of Present Illness (HPI) Chronic/Inactive Condition: He was followed for a period of time by Dr. Durwin Nora of vein and vascular as far as I can see last saw him on 1//24.  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 Last arterial studies I see showed noncompressible ABIs bilaterally but multiphasic waveforms on the right but a TBI of 0.44 on the left 01/31/17; this is a 82 year old man who has a history of type 2 diabetes on oral agents. He has a history of wound difficulties however this is on his right foot in fact he has had amputations of his medial 3 toes. I did not actually look at the right foot today. He tells me about a month ago he was cutting his mycotic Jason [name], Jason [name] ([id number]) [id number] Page 5 of 7 nails informed a wound on the medial aspect of the left great toe. He saw Dr. Logan Bores of podiatry. He has been using Neosporin for a while and now simply wrapping it with gauze. He states he does have peripheral neuropathy. His hemoglobin A1c in this clinic was 1. 02/21/1989 non-compressible on the left. He did have vascular studies done in 2015 showing ABIs of 1.4 bilaterally, TBI's of 1.0 on the right 1.1 on the left with triphasic waveforms. He has not had imaging studies that I'm aware of 02/07/17; this is a 82 year old man with type 2 diabetes on oral agents. [place] came [place] last week with a small open area on the medial aspect of his left great toe. This is in the setting of a significant mycotic nail for which he follows with Dr. Logan Bores of podiatry READMISSION 07/13/2023 This is a now 82 year old man who is had problems with a wound on his right foot amputation site after undergoing a transmetatarsal amputation in May of this year. He was admitted to hospital from 03/29/2023 through 04/02/2023 with sepsis felt to be secondary to an infected wound I think on the right first metatarsal head he underwent a transmetatarsal amputation by podiatry in the hospital. A culture of the wound had previously shown  Klebsiella. He was seen in follow-up in the office on 04/19/2023 with a postop wound infection and was given doxycycline and Augmentin for 14 days. He has been dressing the wounds with Betadine wet-to-dry. Looking through epic it would appear that the follow-up with the podiatry group has been sporadic. The patient tells me he was at [facility] skilled facility although he is back at home now. His wound now is actually probably between the fourth and fifth metatarsal head on the amputation site. He has a smaller wound on the dorsal foot in this area and I am sure at 1 time these connected but I could not prove this today. He just came in with kerlix and dry gauze on the wound. He was followed for a period of time by Dr. Durwin Nora of vein and vascular as far as I can see last saw him on 1//24. 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 Last arterial studies I see showed noncompressible ABIs bilaterally but multiphasic waveforms on the right but a TBI of 0.44 on the left Past medical history includes type 2 diabetes with peripheral neuropathy and  PAD, diastolic heart failure, mitral valve stenosis, atrial fibrillation on Xarelto, sleep apnea ABIs were not repeated today in the clinic as they are previously noncompressible as noted His MRI is listed below; This did not suggest osteomyelitis in the area of the right foot where his wound is currently located MRI 03/29/23 IMPRESSION: 1. Deep skin wound about the plantar aspect of the first metatarsal head with mild marrow edema about the plantar aspect of the first metatarsal head concerning for osteomyelitis. 2. No fluid collection or abscess. 3. Postsurgical changes with prior amputation of the right foot through the metatarsophalangeal joints of the first and fifth digit and through metatarsal heads of the second through  fourth digits. 4. Edema of the plantar muscles and tendons suggesting diabetic myopathy/myositis. 07-20-2023 upon evaluation today patient presents for follow-up evaluation here in the clinic. I did review his note from the last visit with Dr. Leanord Hawking. With that being said he is a current patient of vein and vascular here in [place] and has seen them last it looks like according to Dr. Jannetta Quint note in January of this year. At that point he had multiphasic waveforms on the right with a TBI of 0.44 on the left. I do not see any recent x-rays currently although he has seen podiatry I cannot see any of those x-rays. I really think having him go for a x-ray of the foot would be a good idea. 07-27-2023 upon evaluation today patient appears to be doing well currently in regard to his wound. He is actually showing signs of good improvement. I actually feel like this is significantly improved compared to last week's evaluation he does seem to have good granulation and epithelization and very pleased there is some callus I am going to need to remove however. 08-10-2023 upon evaluation today patient appears to be doing well currently in regard to his wound. The foot actually appears to be showing signs of excellent improvement and I am very pleased in that regard. Fortunately I do not see any evidence of worsening overall and I do believe that the patient is making good headway towards closure. For now I am probably going to recommend that we continue with the [facility] although we may make a switch to collagen in the near future depending on how things progress. 08-24-2023 upon evaluation today patient appears to be doing well currently in regard to his wound. He has been tolerating the dressing changes I do believe it may be good to switch over to endoform at this point he is in agreement with that plan. Fortunately I do not see any evidence of infection or worsening overall and I am pleased in that  regard. 08-31-2023 upon evaluation today patient's wound bed actually showed signs of good granulation epithelization at this point. Fortunately I do not see any signs of infection he seems to be doing quite well based on what I am seeing today. 09-07-2023 upon evaluation today patient appears to be doing well currently in regard to his wound. Has been tolerating the dressing changes without complication. Fortunately I do not see any signs of active infection locally or systemically at this time which is great news. No fevers, chills, nausea, vomiting, or diarrhea. 09-14-2023 upon evaluation today patient appears to be doing excellent in regard to his foot ulcer. This is getting very close to complete resolution. Fortunately I do not see any evidence of worsening overall and I do believe that the patient is making excellent headway towards complete closure.  11/6; this is a wound on the right TMA tip at the level roughly of the fourth metatarsal. Small wound with 0.5 cm of depth we have been using endoform ABDs and gauze. 11/13; right TMA at roughly the fourth metatarsal. The surface callus raised the possibility that there was healing a healed state here. We have been using endoform ABDs and gauze. 11/20; right TMA at roughly the fourth metatarsal again comes in with surface callus. This requires debridement we have been using endoform ABDs and gauze this is being changed daily by a neighbor who has medical training 12/3; right TMA at roughly the fourth metatarsal head. Again a completely nonviable surface with callus dry skin and subcutaneous debris. I used a #3 curette to remove all of this. The wound cleans up quite nicely however it has a worrisome amount of depth. He is a type II diabetic. 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 Annamary Rummage. I am assuming this is the only area  that remains. 12/11; patient is continually deteriorated with the wound on the plantar aspect of the roughly the right fourth metatarsal and his TMA site. On 11 6 this was down to 0.5 cm in depth it has gotten worse than this with undermining. PCR culture I did last time showed Enterobacter. No resistance genes were detected. We sent in cefdinir 300 twice daily for 10 days but this has been picked up yet. We are also going to use topical gentamicin Jason [name], Jason [name] ([id number]) [id number] Page 6 of 7 Objective Constitutional Sitting or standing Blood Pressure is within target range for patient.. Pulse regular and within target range for patient.Marland Kitchen Respirations regular, non-labored and within target range.. Temperature is normal and within the target range for the patient.Marland Kitchen Appears in no distress. Vitals Time Taken: 1:30 PM, Height: 80 in, Weight: 280 lbs, BMI: 30.8, Temperature: 98 F, Pulse: 63 bpm, Respiratory Rate: 20 breaths/min, Blood Pressure: 114/70 mmHg, Capillary Blood Glucose: 126 mg/dl. 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 0.6 cm in depth 0.3 to 4 cm and undermining circumferentially. This does not go to bone there is no surrounding soft tissue infection Integumentary (Hair, Skin) Wound #5 status is Open. Original cause of wound was Gradually Appeared. The date acquired was: 04/19/2023. The wound has been in treatment 15 weeks. 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 The wound measures 0.6cm length x 0.7cm width x 0.9cm depth; 0.33cm^2 area and 0.297cm^3 volume. There is Fat Layer (Subcutaneous Tissue) exposed. There is no tunneling noted, however, there is undermining starting at 12:00 and ending at 12:00 with a maximum distance of 0.6cm. There is a medium amount of serosanguineous drainage noted. The wound margin is well defined and not attached to the  wound base. There is large (67-100%) red granulation within the wound bed. There is a small (1-33%) amount of necrotic tissue within the wound bed including Eschar. The periwound skin appearance had no abnormalities noted for color. The periwound skin appearance exhibited: Callus, Scarring, Dry/Scaly. Periwound temperature was noted as No Abnormality. Assessment Active Problems ICD-10 Type 2 diabetes mellitus with foot ulcer Non-pressure chronic ulcer of other part of right foot with other specified severity Type 2 diabetes mellitus with diabetic peripheral angiopathy without gangrene Type 2 diabetes mellitus with diabetic polyneuropathy Plan Follow-up Appointments: Return  Appointment in 1 week. - Dr. Leanord Hawking (already scheduled) 11/02/2023 115pm Nurse Visit: - week of Christmas and new years (front office to schedule) Other: - Pick up oral and topical antibiotics from [facility] today. Anesthetic: (In clinic) Topical Lidocaine 4% applied to wound bed - Used in Clinic Bathing/ Shower/ Hygiene: May shower and wash wound with soap and water. - Please keep the wound dressing dry on the days the wound dressing is not being changed Off-Loading: Open toe surgical shoe to: - right foot surgical shoe The following medication(s) was prescribed: gentamicin topical 0.1 % cream cream topical once daily to wound with dressing changes daily for wound infection starting 10/26/2023 WOUND #5: - Amputation Site - Transmetatarsal Wound Laterality: Right Cleanser: Normal Saline (Generic) Every Other Day/30 Days Discharge Instructions: use to slightly moisten Endoform before placing in the wound bed Cleanser: Soap and Water Every Other Day/30 Days Discharge Instructions: May shower and wash wound with dial antibacterial soap and water prior to dressing change. Cleanser: Vashe 5.8 (oz) Every Other Day/30 Days Discharge Instructions: Cleanse the wound with Vashe prior to applying a clean dressing using gauze sponges,  not tissue or cotton balls. Cleanser: Wound Cleanser Every Other Day/30 Days Discharge Instructions: Cleanse the wound with wound cleanser prior to applying a clean dressing using gauze sponges, not tissue or cotton balls. Topical: Gentamicin Every Other Day/30 Days Discharge Instructions: As directed by physician directly to wound bed. Prim Dressing: Maxorb Extra Ag+ Alginate Dressing, 2x2 (in/in) (Generic) Every Other Day/30 Days ary Discharge Instructions: Apply to wound bed as instructed Secondary Dressing: ABD Pad, 5x9 (Generic) Every Other Day/30 Days Discharge Instructions: Apply over primary dressing as directed. Secondary Dressing: Woven Gauze Sponge, Non-Sterile 4x4 in (Generic) Every Other Day/30 Days Discharge Instructions: Apply over primary dressing as directed. Secured With: [employer], 4.5x3.1 (in/yd) (Generic) Every Other Day/30 Days Discharge Instructions: Secure with Kerlix as directed. Secured With: Paper T ape, 2x10 (in/yd) (Generic) Every Other Day/30 Days Discharge Instructions: Secure dressing with tape as directed. [place], [place] ([id number]) [id number] Page 7 of 7 1. We are going to change the dressing to gentamicin under silver alginate. 2. Awaiting the x-ray to be read. 3. Gentamicin topically oral cefdinir both of these are at [facility] on [address]) Signed: 10/26/2023 5:18:28 PM By: Baltazar Najjar MD Entered By: Baltazar Najjar on 10/26/2023 11:02:51 -------------------------------------------------------------------------------- SuperBill Details Patient Name: Date of Service: Jason Jobs 10/26/2023 Medical Record Number: [id number] Patient Account Number: [id number] Date of Birth/Sex: Treating RN: [birth year] (82 y.o. Male) Shawn Stall Primary Care Provider: [name] Other Clinician: Referring Provider: Treating Provider/Extender: Erasmo Downer in Treatment:  15 Diagnosis Coding ICD-10 Codes Code Description E11.621 Type 2 diabetes mellitus with foot ulcer L97.518 Non-pressure chronic ulcer of other part of right foot with other specified severity E11.51 Type 2 diabetes mellitus with diabetic peripheral angiopathy without gangrene E11.42 Type 2 diabetes mellitus with diabetic polyneuropathy Facility Procedures : CPT4 Code: [id number] Description: 99213 - WOUND CARE VISIT-LEV 3 EST PT Modifier: Quantity: 1 Physician Procedures : CPT4 Code Description Modifier [id number] - WC PHYS LEVEL 3 - EST PT ICD-10 Diagnosis Description L97.518 Non-pressure chronic ulcer of other part of right foot with other specified severity E11.621 Type 2 diabetes mellitus with foot ulcer Quantity: 1 Electronic Signature(s) Signed: 10/26/2023 5:18:28 PM By: Baltazar Najjar MD Entered By: Baltazar Najjar on 10/26/2023 11:03:16

## 2023-11-02 ENCOUNTER — Ambulatory Visit (HOSPITAL_BASED_OUTPATIENT_CLINIC_OR_DEPARTMENT_OTHER): Admitting: Internal Medicine

## 2023-11-03 ENCOUNTER — Encounter (HOSPITAL_BASED_OUTPATIENT_CLINIC_OR_DEPARTMENT_OTHER): Admitting: Internal Medicine

## 2023-11-03 DIAGNOSIS — E11621 Type 2 diabetes mellitus with foot ulcer: Secondary | ICD-10-CM | POA: Diagnosis not present

## 2023-11-04 NOTE — Progress Notes (Signed)
Jason [name], Jason [name] ([id number]) [id number] Page 1 of 7 Visit Report for 11/03/2023 HPI Details Patient Name: Date of Service: Jason [name], Jason [name] 11/03/2023 12:30 PM Medical Record Number: [id number] Patient Account Number: [id number] Date of Birth/Sex: Treating RN: 28-Sep-1941 (82 y.o. M) Primary Care Provider: [name] Other Clinician: Referring Provider: Treating Provider/Extender: Erasmo Downer in Treatment: 16 History of Present Illness Chronic/Inactive Conditions Condition 1: He was followed for a period of time by Dr. Durwin Nora of vein and vascular as far as I can see last saw him on 1//24. 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 Last arterial studies I see showed noncompressible ABIs bilaterally but multiphasic waveforms on the right but a TBI of 0.44 on the left HPI Description: 01/31/17; this is a 82 year old man who has a history of type 2 diabetes on oral agents. He has a history of wound difficulties however this is on his right foot in fact he has had amputations of his medial 3 toes. I did not actually look at the right foot today. He tells me about a month ago he was cutting his mycotic nails informed a wound on the medial aspect of the left great toe. He saw Dr. Logan Bores of podiatry. He has been using Neosporin for a while and now simply wrapping it with gauze. He states he does have peripheral neuropathy. His hemoglobin A1c in this clinic was 1. 02/21/1989 non-compressible on the left. He did have vascular studies done in 2015 showing ABIs of 1.4 bilaterally, TBI's of 1.0 on the right 1.1 on the left with triphasic waveforms. He has not had imaging studies that I'm aware of 02/07/17; this is a 82 year old man with type 2 diabetes on oral agents. [place] with a small open area on the medial  aspect of his left great toe. This is in the setting of a significant mycotic nail for which he follows with Dr. Logan Bores of podiatry READMISSION 07/13/2023 This is a now 82 year old man who is had problems with a wound on his right foot amputation site after undergoing a transmetatarsal amputation in May of this year. He was admitted to hospital from 03/29/2023 through 04/02/2023 with sepsis felt to be secondary to an infected wound I think on the right first metatarsal head he underwent a transmetatarsal amputation by podiatry in the hospital. A culture of the wound had previously shown Klebsiella. He was seen in follow-up in the office on 04/19/2023 with a postop wound infection and was given doxycycline and Augmentin for 14 days. He has been dressing the wounds with Betadine wet-to-dry. Looking through epic it would appear that the follow-up with the podiatry group has been sporadic. The patient tells me he was at [facility] skilled facility although he is back at home now. His wound now is actually probably between the fourth and fifth metatarsal head on the amputation site. He has a smaller wound on the dorsal foot in this area and I am sure at 1 time these connected but I could not prove this today. He just came in with kerlix and dry gauze on the wound. He was followed for a period of time by Dr. Durwin Nora of vein and vascular as far as I can see last saw him on 1//24. At that time he was felt to have mild infrainguinal arterial occlusive disease  but had multiphasic signals in both feet which should have been sufficient to heal wounds. Last arterial studies I see showed noncompressible ABIs bilaterally but multiphasic waveforms on the right but a TBI of 0.44 on the left Past medical history includes type 2 diabetes with peripheral neuropathy and PAD, diastolic heart failure, mitral valve stenosis, atrial fibrillation on Xarelto, sleep apnea ABIs were not repeated today in the clinic as they are  previously noncompressible as noted His MRI is listed below; This did not suggest osteomyelitis in the area of the right foot where his wound is currently located MRI 03/29/23 IMPRESSION: 1. Deep skin wound about the plantar aspect of the first metatarsal head with mild marrow edema about the plantar aspect of the first metatarsal head concerning for osteomyelitis. 2. No fluid collection or abscess. 3. Postsurgical changes with prior amputation of the right foot through the metatarsophalangeal joints of the first and fifth digit and through metatarsal heads of the second through fourth digits. 4. Edema of the plantar muscles and tendons suggesting diabetic myopathy/myositis. 07-20-2023 upon evaluation today patient presents for follow-up evaluation here in the clinic. I did review his note from the last visit with Dr. Leanord Hawking. With that being said he is a current patient of vein and vascular here in [place] and has seen them last it looks like according to Dr. Jannetta Quint note in January of this year. At that point he had multiphasic waveforms on the right with a TBI of 0.44 on the left. I do not see any recent x-rays currently although he has seen podiatry I cannot see any of those x-rays. I really think having him go for a x-ray of the foot would be a good idea. 07-27-2023 upon evaluation today patient appears to be doing well currently in regard to his wound. He is actually showing signs of good improvement. I actually feel like this is significantly improved compared to last week's evaluation he does seem to have good granulation and epithelization and very pleased there is some callus I am going to need to remove however. 08-10-2023 upon evaluation today patient appears to be doing well currently in regard to his wound. The foot actually appears to be showing signs of excellent improvement and I am very pleased in that regard. Fortunately I do not see any evidence of worsening overall and I do  believe that the patient is making good headway towards closure. For now I am probably going to recommend that we continue with the [facility] although we may make a switch to collagen in the near future depending on how things progress. 08-24-2023 upon evaluation today patient appears to be doing well currently in regard to his wound. He has been tolerating the dressing changes I do believe it CINCH, RIPPLE ([id number]) [id number] Page 2 of 7 may be good to switch over to endoform at this point he is in agreement with that plan. Fortunately I do not see any evidence of infection or worsening overall and I am pleased in that regard. 08-31-2023 upon evaluation today patient's wound bed actually showed signs of good granulation epithelization at this point. Fortunately I do not see any signs of infection he seems to be doing quite well based on what I am seeing today. 09-07-2023 upon evaluation today patient appears to be doing well currently in regard to his wound. Has been tolerating the dressing changes without complication. Fortunately I do not see any signs of active infection locally or systemically at this time which  is great news. No fevers, chills, nausea, vomiting, or diarrhea. 09-14-2023 upon evaluation today patient appears to be doing excellent in regard to his foot ulcer. This is getting very close to complete resolution. Fortunately I do not see any evidence of worsening overall and I do believe that the patient is making excellent headway towards complete closure. 11/6; this is a wound on the right TMA tip at the level roughly of the fourth metatarsal. Small wound with 0.5 cm of depth we have been using endoform ABDs and gauze. 11/13; right TMA at roughly the fourth metatarsal. The surface callus raised the possibility that there was healing a healed state here. We have been using endoform ABDs and gauze. 11/20; right TMA at roughly the fourth  metatarsal again comes in with surface callus. This requires debridement we have been using endoform ABDs and gauze this is being changed daily by a neighbor who has medical training 12/3; right TMA at roughly the fourth metatarsal head. Again a completely nonviable surface with callus dry skin and subcutaneous debris. I used a #3 curette to remove all of this. The wound cleans up quite nicely however it has a worrisome amount of depth. He is a type II diabetic. 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 Annamary Rummage. I am assuming this is the only area that remains. 12/11; patient is continually deteriorated with the wound on the plantar aspect of the roughly the right fourth metatarsal and his TMA site. On 11 6 this was down to 0.5 cm in depth it has gotten worse than this with undermining. PCR culture I did last time showed Enterobacter. No resistance genes were detected. We sent in cefdinir 300 twice daily for 10 days but this has been picked up yet. We are also going to use topical gentamicin 12/19; with the patient was here last week he had developed a major deterioration deeper wound. PCR culture I did showed Enterobacter we have been using topical gentamicin, silver alginate and oral antibiotics for 10 days. In the interim he seems to have not done well. He had a fall yesterday. He is now apparently under hospice care. The patient states he has generalized weakness. His wife says that he has significant chronic mitral regurgitation that cannot be repaired Electronic Signature(s) Signed: 11/03/2023 5:18:32 PM By: Baltazar Najjar MD Entered By: Baltazar Najjar on 11/03/2023 09:59:39 -------------------------------------------------------------------------------- Physical Exam Details Patient Name: Date of Service: Jason Milian S. 11/03/2023 12:30 PM Medical Record Number:  [id number] Patient Account Number: [id number] Date of Birth/Sex: Treating RN: Aug 05, 1941 (82 y.o. M) Primary Care Provider: [name] Other Clinician: Referring Provider: Treating Provider/Extender: Erasmo Downer in Treatment: 16 Constitutional Sitting or standing Blood Pressure is within target range for patient.. Pulse regular and within target range for patient.Marland Kitchen Respirations regular, non-labored and within target range.. Temperature is normal and within the target range for the patient.Marland Kitchen Appears in no distress. Notes Wound exam; small punched area out area at roughly the fourth metatarsal head. This does not look too much worse than last week. In fact there is no obvious undermining although I think the depth is the same. This does not probe to bone there is no purulent drainage no periwound tenderness. Electronic Signature(s) Signed: 11/03/2023 5:18:32 PM By: Baltazar Najjar MD Entered By: Baltazar Najjar on 11/03/2023 10:01:19 Physician Orders Details -------------------------------------------------------------------------------- [name] ([id number]) [id number]  Page 3 of 7 Patient Name: Date of Service: Jason [name], Jason [name] 11/03/2023 12:30 PM Medical Record Number: [id number] Patient Account Number: [id number] Date of Birth/Sex: Treating RN: 05/11/1941 (82 y.o. Tammy Sours Primary Care Provider: [name] Other Clinician: Referring Provider: Treating Provider/Extender: Erasmo Downer in Treatment: 16 The following information was scribed by: Shawn Stall The information was scribed for: Baltazar Najjar Verbal / Phone Orders: No Diagnosis Coding Follow-up Appointments Return appointment in 3 weeks. - ****Dr. Mikey Bussing week she returns**** Anesthetic (In clinic) Topical Lidocaine 4% applied to wound bed - Used in Clinic Bathing/ Shower/ Hygiene May shower and wash wound with soap and water. - Please  keep the wound dressing dry on the days the wound dressing is not being changed Off-Loading Open toe surgical shoe to: - right foot surgical shoe Home Health No change in wound care orders this week; continue Home Health for wound care. May utilize formulary equivalent dressing for wound treatment orders unless otherwise specified. Other Home Health Orders/Instructions: - [facility] out of [place]. Wound Treatment Wound #5 - Amputation Site - Transmetatarsal Wound Laterality: Right Cleanser: Soap and Water 2 x Per Week/30 Days Discharge Instructions: May shower and wash wound with dial antibacterial soap and water prior to dressing change. Cleanser: Vashe 5.8 (oz) 2 x Per Week/30 Days Discharge Instructions: Cleanse the wound with Vashe prior to applying a clean dressing using gauze sponges, not tissue or cotton balls. Topical: Gentamicin 2 x Per Week/30 Days Discharge Instructions: As directed by physician directly to wound bed. Prim Dressing: Maxorb Extra Ag+ Alginate Dressing, 2x2 (in/in) (Generic) 2 x Per Week/30 Days ary Discharge Instructions: Apply to wound bed as instructed Secondary Dressing: ABD Pad, 5x9 (Generic) 2 x Per Week/30 Days Discharge Instructions: Apply over primary dressing as directed. Secondary Dressing: Woven Gauze Sponge, Non-Sterile 4x4 in (Generic) 2 x Per Week/30 Days Discharge Instructions: Apply over primary dressing as directed. Secured With: Paper Tape, 2x10 (in/yd) (Generic) 2 x Per Week/30 Days Discharge Instructions: Secure dressing with tape as directed. Electronic Signature(s) Signed: 11/03/2023 5:18:32 PM By: Baltazar Najjar MD Signed: 11/03/2023 6:09:01 PM By: Shawn Stall RN, BSN Entered By: Shawn Stall on 11/03/2023 09:56:12 -------------------------------------------------------------------------------- Problem List Details Patient Name: Date of Service: Jason Jobs. 11/03/2023 12:30 PM Medical Record Number:  [id number] Patient Account Number: [id number] Date of Birth/Sex: Treating RN: 1941/08/31 (82 y.o. M) Primary Care Provider: [name] Other Clinician: Referring Provider: Treating Provider/Extender: Erasmo Downer in Treatment: 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 11/03/2023 12:30 PM Medical Record Number: [id number] Patient Account Number: [id number] Date of Birth/Sex: Treating RN: August 26, 1941 (82 y.o. M) Primary Care Provider: [name] Other Clinician: Referring Provider: Treating Provider/Extender: Erasmo Downer in Treatment: 16 Subjective History of Present Illness (HPI) Chronic/Inactive Condition: He was followed for a period of time by Dr. Durwin Nora of  vein and vascular as far as I can see last saw him on 1//24. 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 Last arterial studies I see showed noncompressible ABIs bilaterally but multiphasic waveforms on the right but a TBI of 0.44 on the left 01/31/17; this is a 82 year old man who has a  history of type 2 diabetes on oral agents. He has a history of wound difficulties however this is on his right foot in fact he has had amputations of his medial 3 toes. I did not actually look at the right foot today. He tells me about a month ago he was cutting his mycotic nails informed a wound on the medial aspect of the left great toe. He saw Dr. Logan Bores of podiatry. He has been using Neosporin for a while and now simply wrapping it with gauze. He states he does have peripheral neuropathy. His hemoglobin A1c in this clinic was 1. 02/21/1989 non-compressible on the left. He did have vascular studies done in 2015 showing ABIs of 1.4 bilaterally, TBI's of 1.0 on the right 1.1 on the left with triphasic waveforms. He has not had imaging studies that I'm aware of 02/07/17; this is a 82 year old man with type 2 diabetes on oral agents. [place] last week with a small open area on the medial aspect of his left great toe. This is in the setting of a significant mycotic nail for which he follows with Dr. Logan Bores of podiatry READMISSION 07/13/2023 This is a now 82 year old man who is had problems with a wound on his right foot amputation site after undergoing a transmetatarsal amputation in May of this year. He was admitted to hospital from 03/29/2023 through 04/02/2023 with sepsis felt to be secondary to an infected wound I think on the right first metatarsal head he underwent a transmetatarsal amputation by podiatry in the hospital. A culture of the wound had previously shown Klebsiella. He was seen in follow-up in the office on 04/19/2023 with a postop wound infection and was given doxycycline and Augmentin for 14 days. He has been dressing the wounds with Betadine wet-to-dry. Looking through epic it would appear that the follow-up with the podiatry group has been sporadic. The patient tells me he was at [facility] skilled facility although he is back at home now. His wound now is actually  probably between the fourth and fifth metatarsal head on the amputation site. He has a smaller wound on the dorsal foot in this area and I am sure at 1 time these connected but I could not prove this today. He just came in with kerlix and dry gauze on the wound. Jason [name], Jason [name] ([id number]) [id number] Page 5 of 7 He was followed for a period of time by Dr. Durwin Nora of vein and vascular as far as I can see last saw him on 1//24. 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 Last arterial studies I see showed noncompressible ABIs bilaterally but multiphasic waveforms on the right but a TBI of 0.44 on the left Past medical history includes type 2 diabetes with peripheral neuropathy and PAD, diastolic heart failure, mitral valve stenosis, atrial fibrillation on Xarelto, sleep apnea ABIs were not repeated today in the clinic as they are previously noncompressible as  noted His MRI is listed below; This did not suggest osteomyelitis in the area of the right foot where his wound is currently located MRI 03/29/23 IMPRESSION: 1. Deep skin wound about the plantar aspect of the first metatarsal head with mild marrow edema about the plantar aspect of the first metatarsal head concerning for osteomyelitis. 2. No fluid collection or abscess. 3. Postsurgical changes with prior amputation of the right foot through the metatarsophalangeal joints of the first and fifth digit and through metatarsal heads of the second through fourth digits. 4. Edema of the plantar muscles and tendons suggesting diabetic myopathy/myositis. 07-20-2023 upon evaluation today patient presents for follow-up evaluation here in the clinic. I did review his note from the last visit with Dr. Leanord Hawking. With that being said he is a current patient of vein and vascular here in [place] and has seen  them last it looks like according to Dr. Jannetta Quint note in January of this year. At that point he had multiphasic waveforms on the right with a TBI of 0.44 on the left. I do not see any recent x-rays currently although he has seen podiatry I cannot see any of those x-rays. I really think having him go for a x-ray of the foot would be a good idea. 07-27-2023 upon evaluation today patient appears to be doing well currently in regard to his wound. He is actually showing signs of good improvement. I actually feel like this is significantly improved compared to last week's evaluation he does seem to have good granulation and epithelization and very pleased there is some callus I am going to need to remove however. 08-10-2023 upon evaluation today patient appears to be doing well currently in regard to his wound. The foot actually appears to be showing signs of excellent improvement and I am very pleased in that regard. Fortunately I do not see any evidence of worsening overall and I do believe that the patient is making good headway towards closure. For now I am probably going to recommend that we continue with the [facility] although we may make a switch to collagen in the near future depending on how things progress. 08-24-2023 upon evaluation today patient appears to be doing well currently in regard to his wound. He has been tolerating the dressing changes I do believe it may be good to switch over to endoform at this point he is in agreement with that plan. Fortunately I do not see any evidence of infection or worsening overall and I am pleased in that regard. 08-31-2023 upon evaluation today patient's wound bed actually showed signs of good granulation epithelization at this point. Fortunately I do not see any signs of infection he seems to be doing quite well based on what I am seeing today. 09-07-2023 upon evaluation today patient appears to be doing well currently in regard to his wound. Has been  tolerating the dressing changes without complication. Fortunately I do not see any signs of active infection locally or systemically at this time which is great news. No fevers, chills, nausea, vomiting, or diarrhea. 09-14-2023 upon evaluation today patient appears to be doing excellent in regard to his foot ulcer. This is getting very close to complete resolution. Fortunately I do not see any evidence of worsening overall and I do believe that the patient is making excellent headway towards complete closure. 11/6; this is a wound on the right TMA tip at the level roughly of the fourth metatarsal. Small wound with 0.5 cm of depth we have  been using endoform ABDs and gauze. 11/13; right TMA at roughly the fourth metatarsal. The surface callus raised the possibility that there was healing a healed state here. We have been using endoform ABDs and gauze. 11/20; right TMA at roughly the fourth metatarsal again comes in with surface callus. This requires debridement we have been using endoform ABDs and gauze this is being changed daily by a neighbor who has medical training 12/3; right TMA at roughly the fourth metatarsal head. Again a completely nonviable surface with callus dry skin and subcutaneous debris. I used a #3 curette to remove all of this. The wound cleans up quite nicely however it has a worrisome amount of depth. He is a type II diabetic. 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 Annamary Rummage. I am assuming this is the only area that remains. 12/11; patient is continually deteriorated with the wound on the plantar aspect of the roughly the right fourth metatarsal and his TMA site. On 11 6 this was down to 0.5 cm in depth it has gotten worse than this with undermining. PCR culture I did last time showed Enterobacter. No resistance genes were detected. We sent in cefdinir 300 twice  daily for 10 days but this has been picked up yet. We are also going to use topical gentamicin 12/19; with the patient was here last week he had developed a major deterioration deeper wound. PCR culture I did showed Enterobacter we have been using topical gentamicin, silver alginate and oral antibiotics for 10 days. In the interim he seems to have not done well. He had a fall yesterday. He is now apparently under hospice care. The patient states he has generalized weakness. His wife says that he has significant chronic mitral regurgitation that cannot be repaired Objective Constitutional Sitting or standing Blood Pressure is within target range for patient.. Pulse regular and within target range for patient.Marland Kitchen Respirations regular, non-labored and within target range.. Temperature is normal and within the target range for the patient.Marland Kitchen Appears in no distress. Vitals Time Taken: 12:40 PM, Height: 80 in, Weight: 280 lbs, BMI: 30.8, Temperature: 97.7 F, Pulse: 64 bpm, Respiratory Rate: 18 breaths/min, Blood Jason [name], Jason [name] ([id number]) [id number] Page 6 of 7 Pressure: 119/73 mmHg, Capillary Blood Glucose: 96 mg/dl. General Notes: Wound exam; small punched area out area at roughly the fourth metatarsal head. This does not look too much worse than last week. In fact there is no obvious undermining although I think the depth is the same. This does not probe to bone there is no purulent drainage no periwound tenderness. Integumentary (Hair, Skin) Wound #5 status is Open. Original cause of wound was Gradually Appeared. The date acquired was: 04/19/2023. The wound has been in treatment 16 weeks. 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 The wound measures 0.3cm length x 0.4cm width x 0.4cm depth; 0.094cm^2 area and 0.038cm^3 volume. There is Fat Layer (Subcutaneous Tissue) exposed. There is no tunneling or undermining noted. There is a medium amount  of serosanguineous drainage noted. The wound margin is well defined and not attached to the wound base. There is large (67-100%) red granulation within the wound bed. There is a small (1-33%) amount of necrotic tissue within the wound bed including Eschar. The periwound skin appearance had no abnormalities noted for color. The periwound skin appearance exhibited: Callus, Scarring, Dry/Scaly. Periwound temperature was noted  as No Abnormality. Assessment Active Problems ICD-10 Type 2 diabetes mellitus with foot ulcer Non-pressure chronic ulcer of other part of right foot with other specified severity Type 2 diabetes mellitus with diabetic peripheral angiopathy without gangrene Type 2 diabetes mellitus with diabetic polyneuropathy Plan Follow-up Appointments: Return appointment in 3 weeks. - ****Dr. Mikey Bussing week she returns**** Anesthetic: (In clinic) Topical Lidocaine 4% applied to wound bed - Used in Clinic Bathing/ Shower/ Hygiene: May shower and wash wound with soap and water. - Please keep the wound dressing dry on the days the wound dressing is not being changed Off-Loading: Open toe surgical shoe to: - right foot surgical shoe Home Health: No change in wound care orders this week; continue Home Health for wound care. May utilize formulary equivalent dressing for wound treatment orders unless otherwise specified. Other Home Health Orders/Instructions: - [facility]osp[facility] out of [place]. WOUND #5: - Amputation Site - Transmetatarsal Wound Laterality: Right Cleanser: Soap and Water 2 x Per Week/30 Days Discharge Instructions: May shower and wash wound with dial antibacterial soap and water prior to dressing change. Cleanser: Vashe 5.8 (oz) 2 x Per Week/30 Days Discharge Instructions: Cleanse the wound with Vashe prior to applying a clean dressing using gauze sponges, not tissue or cotton balls. Topical: Gentamicin 2 x Per Week/30 Days Discharge Instructions: As directed by physician  directly to wound bed. Prim Dressing: Maxorb Extra Ag+ Alginate Dressing, 2x2 (in/in) (Generic) 2 x Per Week/30 Days ary Discharge Instructions: Apply to wound bed as instructed Secondary Dressing: ABD Pad, 5x9 (Generic) 2 x Per Week/30 Days Discharge Instructions: Apply over primary dressing as directed. Secondary Dressing: Woven Gauze Sponge, Non-Sterile 4x4 in (Generic) 2 x Per Week/30 Days Discharge Instructions: Apply over primary dressing as directed. Secured With: Paper T ape, 2x10 (in/yd) (Generic) 2 x Per Week/30 Days Discharge Instructions: Secure dressing with tape as directed. 1. Will continue with gentamicin and silver alginate. This wound does not probe to bone. His x-ray did not show significant cortical erosion to suggest osteomyelitis 2. He is now under hospice designation and I am really not sure what the underlying hospice diagnosis here is. Apparently they know that he comes here and also podiatry but will try to clarify what this means exactly. I will likely not pursue aggressive evaluations like an MRI etc. Electronic Signature(s) Signed: 11/03/2023 5:18:32 PM By: Baltazar Najjar MD Entered By: Baltazar Najjar on 11/03/2023 10:02:21 SuperBill Details -------------------------------------------------------------------------------- [name] ([id number]) [id number] Page 7 of 7 Patient Name: Date of Service: Jason [name], Jason [name] 11/03/2023 Medical Record Number: [id number] Patient Account Number: [id number] Date of Birth/Sex: Treating RN: 1941-01-19 (82 y.o. Tammy Sours Primary Care Provider: [name] Other Clinician: Referring Provider: Treating Provider/Extender: Erasmo Downer in Treatment: 16 Diagnosis Coding ICD-10 Codes Code Description E11.621 Type 2 diabetes mellitus with foot ulcer L97.518 Non-pressure chronic ulcer of other part of right foot with other specified severity E11.51 Type 2 diabetes  mellitus with diabetic peripheral angiopathy without gangrene E11.42 Type 2 diabetes mellitus with diabetic polyneuropathy Facility Procedures CPT4 Code Description Modifier Quantity [id number] [phone] - WOUND CARE VISIT-LEV 3 EST PT 1 Physician Procedures Quantity CPT4 Code Description Modifier [id number] - WC PHYS LEVEL 3 - EST PT 1 ICD-10 Diagnosis Description L97.518 Non-pressure chronic ulcer of other part of right foot with other specified severity E11.621 Type 2 diabetes mellitus with foot ulcer Electronic Signature(s) Signed: 11/03/2023 5:18:32 PM By: Baltazar Najjar MD Entered By: Baltazar Najjar on 11/03/2023 10:02:39

## 2023-11-04 NOTE — Progress Notes (Signed)
LARZ, BISSETTE ([id number]) [id number] Page 1 of 9 Visit Report for 11/03/2023 Arrival Information Details Patient Name: Date of Service: Jason [name], Jason [name] 11/03/2023 12:30 PM Medical Record Number: [id number] Patient Account Number: [id number] Date of Birth/Sex: Treating RN: [birth year] (82 y.o. Jason [name] Primary Care Ceciley Buist: [name] Other Clinician: Referring Elward Nocera: Treating Earleen Aoun/Extender: Erasmo Downer in Treatment: 16 Visit Information History Since Last Visit Added or deleted any medications: No Patient Arrived: Wheel Chair Any new allergies or adverse reactions: No Arrival Time: 12:46 Had a fall or experienced change in Yes Accompanied By: Wife activities of daily living that may affect Transfer Assistance: Manual risk of falls: Patient Identification Verified: Yes Signs or symptoms of abuse/neglect since last visito No Secondary Verification Process Completed: Yes Hospitalized since last visit: No Patient Requires Transmission-Based Precautions: No Implantable device outside of the clinic excluding No Patient Has Alerts: Yes cellular tissue based products placed in the center Patient Alerts: ABI R 1.46 (03/29/23) since last visit: ABI L 1.63 (03/29/23) Has Dressing in Place as Prescribed: Yes Pain Present Now: No Notes per wife patient'[name] PCP and cardiologist suggested hospice for patient due to heart status admitted over 1 week ago. Wife and patient do not know the name of the hospice agency. Only provided a number to the office [phone]. Per patient fell at home yesterday, called EMS to help off the floor. Patient declined to go to the ED for evaluation. Electronic Signature([name]) Signed: 11/03/2023 6:09:01 PM By: Shawn Stall RN, BSN Entered By: Shawn Stall on 11/03/2023 12:52:08 -------------------------------------------------------------------------------- Clinic Level of Care Assessment  Details Patient Name: Date of Service: Jason [name], Jason [name] 11/03/2023 12:30 PM Medical Record Number: [id number] Patient Account Number: [id number] Date of Birth/Sex: Treating RN: [birth year] (82 y.o. Tammy Sours Primary Care Gisselle Galvis: [name] Other Clinician: Referring Daira Hine: Treating Janne Faulk/Extender: Erasmo Downer in Treatment: 16 Clinic Level of Care Assessment Items TOOL 4 Quantity Score X- 1 0 Use when only an EandM is performed on FOLLOW-UP visit ASSESSMENTS - Nursing Assessment / Reassessment X- 1 10 Reassessment of Co-morbidities (includes updates in patient status) X- 1 5 Reassessment of Adherence to Treatment Plan ASSESSMENTS - Wound and Skin A ssessment / Reassessment X - Simple Wound Assessment / Reassessment - one wound 1 5 []  - 0 Complex Wound Assessment / Reassessment - multiple wounds []  - 0 Dermatologic / Skin Assessment (not related to wound area) ASSESSMENTS - Focused Assessment Jason [name], Jason [name] ([id number]) [id number] Page 2 of 9 X- 1 5 Circumferential Edema Measurements - multi extremities []  - 0 Nutritional Assessment / Counseling / Intervention []  - 0 Lower Extremity Assessment (monofilament, tuning fork, pulses) []  - 0 Peripheral Arterial Disease Assessment (using hand held doppler) ASSESSMENTS - Ostomy and/or Continence Assessment and Care []  - 0 Incontinence Assessment and Management []  - 0 Ostomy Care Assessment and Management (repouching, etc.) PROCESS - Coordination of Care X - Simple Patient / Family Education for ongoing care 1 15 []  - 0 Complex (extensive) Patient / Family Education for ongoing care X- 1 10 Staff obtains Chiropractor, Records, T Results / Process Orders est []  - 0 Staff telephones HHA, Nursing Homes / Clarify orders / etc []  - 0 Routine Transfer to another Facility (non-emergent condition) []  - 0 Routine Hospital Admission (non-emergent condition) []  - 0 New  Admissions / Manufacturing engineer / Ordering NPWT Apligraf, etc. , []  - 0 Emergency Hospital Admission (emergent condition) X- 1 10 Simple Discharge Coordination []  -  0 Complex (extensive) Discharge Coordination PROCESS - Special Needs []  - 0 Pediatric / Minor Patient Management []  - 0 Isolation Patient Management []  - 0 Hearing / Language / Visual special needs []  - 0 Assessment of Community assistance (transportation, D/C planning, etc.) []  - 0 Additional assistance / Altered mentation []  - 0 Support Surface([name]) Assessment (bed, cushion, seat, etc.) INTERVENTIONS - Wound Cleansing / Measurement X - Simple Wound Cleansing - one wound 1 5 []  - 0 Complex Wound Cleansing - multiple wounds X- 1 5 Wound Imaging (photographs - any number of wounds) []  - 0 Wound Tracing (instead of photographs) X- 1 5 Simple Wound Measurement - one wound []  - 0 Complex Wound Measurement - multiple wounds INTERVENTIONS - Wound Dressings X - Small Wound Dressing one or multiple wounds 1 10 []  - 0 Medium Wound Dressing one or multiple wounds []  - 0 Large Wound Dressing one or multiple wounds X- 1 5 Application of Medications - topical []  - 0 Application of Medications - injection INTERVENTIONS - Miscellaneous []  - 0 External ear exam []  - 0 Specimen Collection (cultures, biopsies, blood, body fluids, etc.) []  - 0 Specimen([name]) / Culture([name]) sent or taken to Lab for analysis []  - 0 Patient Transfer (multiple staff / Michiel Sites Lift / Similar devices) []  - 0 Simple Staple / Suture removal (25 or less) Jason [name], Jason [name] ([id number]) [id number] Page 3 of 9 []  - 0 Complex Staple / Suture removal (26 or more) []  - 0 Hypo / Hyperglycemic Management (close monitor of Blood Glucose) []  - 0 Ankle / Brachial Index (ABI) - do not check if billed separately X- 1 5 Vital Signs Has the patient been seen at the hospital within the last three years: Yes Total Score: 95 Level  Of Care: New/Established - Level 3 Electronic Signature([name]) Signed: 11/03/2023 6:09:01 PM By: Shawn Stall RN, BSN Entered By: Shawn Stall on 11/03/2023 12:57:22 -------------------------------------------------------------------------------- Complex / Palliative Patient Assessment Details Patient Name: Date of Service: Jason Jobs. 11/03/2023 12:30 PM Medical Record Number: [id number] Patient Account Number: [id number] Date of Birth/Sex: Treating RN: 1941/07/08 (82 y.o. Tammy Sours Primary Care Camay Pedigo: [name] Other Clinician: Referring Jessejames Steelman: Treating Jadyn Brasher/Extender: Erasmo Downer in Treatment: 16 Complex Wound Management Criteria Palliative Wound Management Criteria Patient has a terminal condition and Advanced Wound Management would negatively impact the patient'[name] quality of life. Patient currently admitted to hospice care as of last week. Care Approach Wound Care Plan: Palliative Wound Management Electronic Signature([name]) Signed: 11/03/2023 5:18:32 PM By: Baltazar Najjar MD Signed: 11/03/2023 6:09:01 PM By: Shawn Stall RN, BSN Entered By: Shawn Stall on 11/03/2023 13:41:31 -------------------------------------------------------------------------------- Encounter Discharge Information Details Patient Name: Date of Service: Jason Jobs. 11/03/2023 12:30 PM Medical Record Number: [id number] Patient Account Number: [id number] Date of Birth/Sex: Treating RN: Apr 08, 1941 (82 y.o. Tammy Sours Primary Care Marisah Laker: [name] Other Clinician: Referring Darrell Hauk: Treating Dianne Whelchel/Extender: Erasmo Downer in Treatment: 16 Encounter Discharge Information Items Discharge Condition: Stable Ambulatory Status: Wheelchair Discharge Destination: Home Transportation: Private Auto Accompanied By: wife Schedule Follow-up Appointment: Yes Clinical Summary of Care: Electronic Signature([name]) Signed: 11/03/2023 6:09:01  PM By: Shawn Stall RN, BSN CALIL, VANELLA ([id number]) PM By: Shawn Stall RN, BSN [phone].pdf Page 4 of 9 Signed: 11/03/2023 6:09:01 Entered By: Shawn Stall on 11/03/2023 12:57:59 -------------------------------------------------------------------------------- Lower Extremity Assessment Details Patient Name: Date of Service: AESON, SGRO 11/03/2023 12:30 PM Medical Record Number: [id number] Patient Account Number: [id number]  Date of Birth/Sex: Treating RN: [birth year] (82 y.o. Jason [name] Primary Care Hermen Mario: [name] Other Clinician: Referring Zelena Bushong: Treating Stephone Gum/Extender: Erasmo Downer in Treatment: 16 Edema Assessment Assessed: Kyra Searles: No] Franne Forts: No] Edema: [Left: N] [Right: o] Calf Left: Right: Point of Measurement: 38 cm From Medial Instep 37 cm Ankle Left: Right: Point of Measurement: 11 cm From Medial Instep 27 cm Vascular Assessment Pulses: Dorsalis Pedis Palpable: [Right:Yes] Extremity colors, hair growth, and conditions: Extremity Color: [Right:Pale] Hair Growth on Extremity: [Right:No] Temperature of Extremity: [Right:Warm] Dependent Rubor: [Right:No Yes] Electronic Signature([name]) Signed: 11/03/2023 5:51:07 PM By: Redmond Pulling RN, BSN Entered By: Redmond Pulling on 11/03/2023 12:46:07 -------------------------------------------------------------------------------- Multi Wound Chart Details Patient Name: Date of Service: Jason Milian [name]. 11/03/2023 12:30 PM Medical Record Number: [id number] Patient Account Number: [id number] Date of Birth/Sex: Treating RN: 1941-08-15 (82 y.o. M) Primary Care Omeed Osuna: [name] Other Clinician: Referring Nikisha Fleece: Treating Debera Sterba/Extender: Erasmo Downer in Treatment: 16 Vital Signs Height(in): 80 Capillary Blood Glucose(mg/dl): 96 Weight(lbs): 657 Pulse(bpm): 64 Body Mass Index(BMI): 30.8 Blood Pressure(mmHg):  119/73 Temperature(F): 97.7 Respiratory Rate(breaths/min): 18 [Jason [name], Jason [name] ([id number]):Photos:] [id number] Page 5 of 9:5 N/A N/A N/A N/A] Right Amputation Site - N/A N/A Wound Location: Transmetatarsal Gradually Appeared N/A N/A Wounding Event: Diabetic Wound/Ulcer of the Lower N/A N/A Primary Etiology: Extremity Sleep Apnea, Arrhythmia, Congestive N/A N/A Comorbid History: Heart Failure, Peripheral Arterial Disease, Peripheral Venous Disease, Type II Diabetes, Neuropathy 04/19/2023 N/A N/A Date Acquired: 16 N/A N/A Weeks of Treatment: Open N/A N/A Wound Status: No N/A N/A Wound Recurrence: 0.3x0.4x0.4 N/A N/A Measurements L x W x D (cm) 0.094 N/A N/A A (cm) : rea 0.038 N/A N/A Volume (cm) : 97.20% N/A N/A % Reduction in A rea: 97.20% N/A N/A % Reduction in Volume: Grade 2 N/A N/A Classification: Medium N/A N/A Exudate A mount: Serosanguineous N/A N/A Exudate Type: red, brown N/A N/A Exudate Color: Well defined, not attached N/A N/A Wound Margin: Large (67-100%) N/A N/A Granulation A mount: Red N/A N/A Granulation Quality: Small (1-33%) N/A N/A Necrotic A mount: Eschar N/A N/A Necrotic Tissue: Fat Layer (Subcutaneous Tissue): Yes N/A N/A Exposed Structures: Fascia: No Tendon: No Muscle: No Joint: No Bone: No Small (1-33%) N/A N/A Epithelialization: Callus: Yes N/A N/A Periwound Skin Texture: Scarring: Yes Dry/Scaly: Yes N/A N/A Periwound Skin Moisture: No Abnormalities Noted N/A N/A Periwound Skin Color: No Abnormality N/A N/A Temperature: Treatment Notes Wound #5 (Amputation Site - Transmetatarsal) Wound Laterality: Right Cleanser Soap and Water Discharge Instruction: May shower and wash wound with dial antibacterial soap and water prior to dressing change. Vashe 5.8 (oz) Discharge Instruction: Cleanse the wound with Vashe prior to applying a clean dressing using gauze sponges, not tissue or cotton  balls. Peri-Wound Care Topical Gentamicin Discharge Instruction: As directed by physician directly to wound bed. Primary Dressing Maxorb Extra Ag+ Alginate Dressing, 2x2 (in/in) Discharge Instruction: Apply to wound bed as instructed Secondary Dressing ABD Pad, 5x9 Discharge Instruction: Apply over primary dressing as directed. Woven Gauze Sponge, Non-Sterile 4x4 in Discharge Instruction: Apply over primary dressing as directed. Secured With Paper Tape, 2x10 (in/yd) Discharge Instruction: Secure dressing with tape as directed. Jason [name], Jason [name] ([id number]) [id number] Page 6 of 9 Compression Wrap Compression Stockings Add-Ons Electronic Signature([name]) Signed: 11/03/2023 5:18:32 PM By: Baltazar Najjar MD Entered By: Baltazar Najjar on 11/03/2023 12:58:04 -------------------------------------------------------------------------------- Multi-Disciplinary Care Plan Details Patient Name: Date of Service: Jason Milian [name]. 11/03/2023 12:30 PM Medical Record Number: [id number]  Patient Account Number: [id number] Date of Birth/Sex: Treating RN: 01-Oct-1941 (82 y.o. Tammy Sours Primary Care Aretta Stetzel: [name] Other Clinician: Referring Epiphany Seltzer: Treating Jonet Mathies/Extender: Erasmo Downer in Treatment: 16 Active Inactive Wound/Skin Impairment Nursing Diagnoses: Impaired tissue integrity Goals: Patient/caregiver will verbalize understanding of skin care regimen Date Initiated: 07/13/2023 Target Resolution Date: 12/16/2023 Goal Status: Active Interventions: Assess ulceration([name]) every visit Treatment Activities: Skin care regimen initiated : 07/13/2023 Notes: Electronic Signature([name]) Signed: 11/03/2023 6:09:01 PM By: Shawn Stall RN, BSN Entered By: Shawn Stall on 11/03/2023 12:52:42 -------------------------------------------------------------------------------- Pain Assessment Details Patient Name: Date of Service: Jason Milian [name]. 11/03/2023 12:30 PM Medical Record Number: [id number] Patient Account Number: [id number] Date of Birth/Sex: Treating RN: 28-Jan-1941 (82 y.o. Jason [name] Primary Care Jahzier Villalon: [name] Other Clinician: Referring Ren Aspinall: Treating Haddy Mullinax/Extender: Erasmo Downer in Treatment: 16 Active Problems Location of Pain Severity and Description of Pain Jason [name], Jason [name] ([id number]) [id number] Page 7 of 9 Patient Has Paino No Site Locations Pain Management and Medication Current Pain Management: Electronic Signature([name]) Signed: 11/03/2023 5:51:07 PM By: Redmond Pulling RN, BSN Entered By: Redmond Pulling on 11/03/2023 12:45:53 -------------------------------------------------------------------------------- Patient/Caregiver Education Details Patient Name: Date of Service: Jason Jobs 12/19/2024andnbsp12:30 PM Medical Record Number: [id number] Patient Account Number: [id number] Date of Birth/Gender: Treating RN: 04-Aug-1941 (82 y.o. Tammy Sours Primary Care Physician: [name] Other Clinician: Referring Physician: Treating Physician/Extender: Erasmo Downer in Treatment: 16 Education Assessment Education Provided To: Patient Education Topics Provided Wound/Skin Impairment: Handouts: Caring for Your Ulcer Methods: Explain/Verbal Responses: Reinforcements needed Electronic Signature([name]) Signed: 11/03/2023 6:09:01 PM By: Shawn Stall RN, BSN Entered By: Shawn Stall on 11/03/2023 12:52:54 -------------------------------------------------------------------------------- Wound Assessment Details Patient Name: Date of Service: Jason Jobs. 11/03/2023 12:30 PM Medical Record Number: [id number] Patient Account Number: [id number] Jason [name], Jason [name] ([id number]) [id number] Page 8 of 9 Date of Birth/Sex: Treating RN: 1941-07-17 (82 y.o. Jason [name] Primary Care  Reis Goga: Other Clinician: [name] Referring Druanne Bosques: Treating Ike Maragh/Extender: Erasmo Downer in Treatment: 16 Wound Status Wound Number: 5 Primary Diabetic Wound/Ulcer of the Lower Extremity Etiology: Wound Location: Right Amputation Site - Transmetatarsal Wound Open Wounding Event: Gradually Appeared Status: Date Acquired: 04/19/2023 Comorbid Sleep Apnea, Arrhythmia, Congestive Heart Failure, Peripheral Weeks Of Treatment: 16 History: Arterial Disease, Peripheral Venous Disease, Type II Diabetes, Clustered Wound: No Neuropathy Photos Wound Measurements Length: (cm) 0.3 Width: (cm) 0.4 Depth: (cm) 0.4 Area: (cm) 0.094 Volume: (cm) 0.038 % Reduction in Area: 97.2% % Reduction in Volume: 97.2% Epithelialization: Small (1-33%) Tunneling: No Undermining: No Wound Description Classification: Grade 2 Wound Margin: Well defined, not attached Exudate Amount: Medium Exudate Type: Serosanguineous Exudate Color: red, brown Foul Odor After Cleansing: No Slough/Fibrino Yes Wound Bed Granulation Amount: Large (67-100%) Exposed Structure Granulation Quality: Red Fascia Exposed: No Necrotic Amount: Small (1-33%) Fat Layer (Subcutaneous Tissue) Exposed: Yes Necrotic Quality: Eschar Tendon Exposed: No Muscle Exposed: No Joint Exposed: No Bone Exposed: No Periwound Skin Texture Texture Color No Abnormalities Noted: No No Abnormalities Noted: Yes Callus: Yes Temperature / Pain Scarring: Yes Temperature: No Abnormality Moisture No Abnormalities Noted: No Dry / Scaly: Yes Treatment Notes Wound #5 (Amputation Site - Transmetatarsal) Wound Laterality: Right Cleanser Soap and Water Discharge Instruction: May shower and wash wound with dial antibacterial soap and water prior to dressing change. Vashe 5.8 (oz) Discharge Instruction: Cleanse the wound with Vashe prior to applying a clean dressing using gauze sponges, not tissue  or cotton balls. Peri-Wound  Care Topical Jason [name], Jason [name] ([id number]) [id number] Page 9 of 9 Gentamicin Discharge Instruction: As directed by physician directly to wound bed. Primary Dressing Maxorb Extra Ag+ Alginate Dressing, 2x2 (in/in) Discharge Instruction: Apply to wound bed as instructed Secondary Dressing ABD Pad, 5x9 Discharge Instruction: Apply over primary dressing as directed. Woven Gauze Sponge, Non-Sterile 4x4 in Discharge Instruction: Apply over primary dressing as directed. Secured With Paper Tape, 2x10 (in/yd) Discharge Instruction: Secure dressing with tape as directed. Compression Wrap Compression Stockings Add-Ons Electronic Signature([name]) Signed: 11/03/2023 5:51:07 PM By: Redmond Pulling RN, BSN Entered By: Redmond Pulling on 11/03/2023 12:44:41 -------------------------------------------------------------------------------- Vitals Details Patient Name: Date of Service: Jason Milian [name]. 11/03/2023 12:30 PM Medical Record Number: [id number] Patient Account Number: [id number] Date of Birth/Sex: Treating RN: 1941-06-20 (82 y.o. Jason [name] Primary Care Bobby Barton: [name] Other Clinician: Referring Shawntae Lowy: Treating Renessa Wellnitz/Extender: Erasmo Downer in Treatment: 16 Vital Signs Time Taken: 12:40 Temperature (F): 97.7 Height (in): 80 Pulse (bpm): 64 Weight (lbs): 280 Respiratory Rate (breaths/min): 18 Body Mass Index (BMI): 30.8 Blood Pressure (mmHg): 119/73 Capillary Blood Glucose (mg/dl): 96 Reference Range: 80 - 120 mg / dl Electronic Signature([name]) Signed: 11/03/2023 5:51:07 PM By: Redmond Pulling RN, BSN Entered By: Redmond Pulling on 11/03/2023 12:45:43

## 2023-11-04 NOTE — Progress Notes (Signed)
ELISEY, HALBERG ([id number]) [id number] Page 1 of 1 Visit Report for 11/03/2023 Fall Risk Assessment Details Patient Name: Date of Service: Jason [name], Jason [name] 11/03/2023 12:30 PM Medical Record Number: [id number] Patient Account Number: [id number] Date of Birth/Sex: Treating RN: 06/11/1941 (82 y.o. Tammy Sours Primary Care Ledford Goodson: [name] Other Clinician: Referring Ivery Nanney: Treating Mylinh Cragg/Extender: Erasmo Downer in Treatment: 16 Fall Risk Assessment Items Have you had 2 or more falls in the last 12 monthso 0 Yes Have you had any fall that resulted in injury in the last 12 monthso 0 No FALLS RISK SCREEN History of falling - immediate or within 3 months 25 Yes Secondary diagnosis (Do you have 2 or more medical diagnoseso) 0 No Ambulatory aid None/bed rest/wheelchair/nurse 0 Yes Crutches/cane/walker 15 Yes Furniture 0 No Intravenous therapy Access/Saline/Heparin Lock 0 No Gait/Transferring Normal/ bed rest/ wheelchair 0 No Weak (short steps with or without shuffle, stooped but able to lift head while walking, may seek 10 Yes support from furniture) Impaired (short steps with shuffle, may have difficulty arising from chair, head down, impaired 0 No balance) Mental Status Oriented to own ability 0 Yes Notes see arrival tab documentation. Electronic Signature(s) Signed: 11/03/2023 6:09:01 PM By: Shawn Stall RN, BSN Entered By: Shawn Stall on 11/03/2023 12:52:32

## 2023-11-15 ENCOUNTER — Other Ambulatory Visit (HOSPITAL_COMMUNITY): Payer: Self-pay

## 2023-11-24 ENCOUNTER — Encounter (HOSPITAL_BASED_OUTPATIENT_CLINIC_OR_DEPARTMENT_OTHER): Payer: Medicare HMO | Attending: Internal Medicine | Admitting: Internal Medicine

## 2023-11-24 DIAGNOSIS — L97518 Non-pressure chronic ulcer of other part of right foot with other specified severity: Secondary | ICD-10-CM | POA: Diagnosis not present

## 2023-11-24 DIAGNOSIS — E1142 Type 2 diabetes mellitus with diabetic polyneuropathy: Secondary | ICD-10-CM | POA: Insufficient documentation

## 2023-11-24 DIAGNOSIS — G4730 Sleep apnea, unspecified: Secondary | ICD-10-CM | POA: Diagnosis not present

## 2023-11-24 DIAGNOSIS — E1151 Type 2 diabetes mellitus with diabetic peripheral angiopathy without gangrene: Secondary | ICD-10-CM | POA: Diagnosis not present

## 2023-11-24 DIAGNOSIS — I5032 Chronic diastolic (congestive) heart failure: Secondary | ICD-10-CM | POA: Diagnosis not present

## 2023-11-24 DIAGNOSIS — Z7901 Long term (current) use of anticoagulants: Secondary | ICD-10-CM | POA: Diagnosis not present

## 2023-11-24 DIAGNOSIS — E11621 Type 2 diabetes mellitus with foot ulcer: Secondary | ICD-10-CM | POA: Diagnosis present

## 2023-11-28 ENCOUNTER — Ambulatory Visit: Payer: Medicare HMO | Admitting: Podiatry

## 2023-11-30 NOTE — Progress Notes (Signed)
 ERICBERTO, PADGET ([id number]) [id number] Page 1 of 8 Visit Report for 11/24/2023 Arrival Information Details Patient Name: Date of Service: Jason [name], Jason [name] 11/24/2023 12:30 PM Medical Record Number: [id number] Patient Account Number: [id number] Date of Birth/Sex: Treating RN: [birth year] (83 y.o. NETTY Claven Pollen Primary Care Lemma Tetro: Amin, Saad Other Clinician: Referring Raiana Pharris: Treating Mayara Paulson/Extender: Hoffman, Jessica Amin, Saad Weeks in Treatment: 19 Visit Information History Since Last Visit Added or deleted any medications: No Patient Arrived: Wheel Chair Any new allergies or adverse reactions: No Arrival Time: 12:46 Had a fall or experienced change in No Accompanied By: spouse activities of daily living that may affect Transfer Assistance: Manual risk of falls: Patient Identification Verified: Yes Signs or symptoms of abuse/neglect since last visito No Patient Requires Transmission-Based Precautions: No Hospitalized since last visit: No Patient Has Alerts: Yes Implantable device outside of the clinic excluding No Patient Alerts: ABI R 1.46 (03/29/23) cellular tissue based products placed in the center ABI L 1.63 (03/29/23) since last visit: Has Dressing in Place as Prescribed: Yes Pain Present Now: No Electronic Signature(s) Signed: 11/24/2023 6:12:20 PM By: Claven Pollen RN Entered By: Claven Pollen on 11/24/2023 12:54:18 -------------------------------------------------------------------------------- Clinic Level of Care Assessment Details Patient Name: Date of Service: Jason [name], Jason [name] 11/24/2023 12:30 PM Medical Record Number: [id number] Patient Account Number: [id number] Date of Birth/Sex: Treating RN: 08-13-1941 (83 y.o. NETTY Claven Pollen Primary Care Ermina Oberman: Amin, Saad Other Clinician: Referring Nehemias Sauceda: Treating Hayes Rehfeldt/Extender: Rosan Harlene Caleen Roetta Devra in Treatment: 19 Clinic Level of Care Assessment  Items TOOL 4 Quantity Score X- 1 0 Use when only an EandM is performed on FOLLOW-UP visit ASSESSMENTS - Nursing Assessment / Reassessment X- 1 10 Reassessment of Co-morbidities (includes updates in patient status) X- 1 5 Reassessment of Adherence to Treatment Plan ASSESSMENTS - Wound and Skin A ssessment / Reassessment X - Simple Wound Assessment / Reassessment - one wound 1 5 []  - 0 Complex Wound Assessment / Reassessment - multiple wounds []  - 0 Dermatologic / Skin Assessment (not related to wound area) ASSESSMENTS - Focused Assessment []  - 0 Circumferential Edema Measurements - multi extremities []  - 0 Nutritional Assessment / Counseling / Intervention KHAMANI, FAIRLEY ([id number]) [id number] Page 2 of 8 []  - 0 Lower Extremity Assessment (monofilament, tuning fork, pulses) []  - 0 Peripheral Arterial Disease Assessment (using hand held doppler) ASSESSMENTS - Ostomy and/or Continence Assessment and Care []  - 0 Incontinence Assessment and Management []  - 0 Ostomy Care Assessment and Management (repouching, etc.) PROCESS - Coordination of Care X - Simple Patient / Family Education for ongoing care 1 15 []  - 0 Complex (extensive) Patient / Family Education for ongoing care X- 1 10 Staff obtains Chiropractor, Records, T Results / Process Orders est X- 1 10 Staff telephones HHA, Nursing Homes / Clarify orders / etc []  - 0 Routine Transfer to another Facility (non-emergent condition) []  - 0 Routine Hospital Admission (non-emergent condition) []  - 0 New Admissions / Manufacturing Engineer / Ordering NPWT Apligraf, etc. , X- 1 20 Emergency Hospital Admission (emergent condition) []  - 0 Simple Discharge Coordination []  - 0 Complex (extensive) Discharge Coordination PROCESS - Special Needs []  - 0 Pediatric / Minor Patient Management []  - 0 Isolation Patient Management []  - 0 Hearing / Language / Visual special needs []  - 0 Assessment of  Community assistance (transportation, D/C planning, etc.) []  - 0 Additional assistance / Altered mentation []  - 0 Support Surface(s) Assessment (bed, cushion, seat, etc.) INTERVENTIONS - Wound Cleansing /  Measurement X - Simple Wound Cleansing - one wound 1 5 []  - 0 Complex Wound Cleansing - multiple wounds X- 1 5 Wound Imaging (photographs - any number of wounds) []  - 0 Wound Tracing (instead of photographs) X- 1 5 Simple Wound Measurement - one wound []  - 0 Complex Wound Measurement - multiple wounds INTERVENTIONS - Wound Dressings X - Small Wound Dressing one or multiple wounds 1 10 []  - 0 Medium Wound Dressing one or multiple wounds []  - 0 Large Wound Dressing one or multiple wounds []  - 0 Application of Medications - topical []  - 0 Application of Medications - injection INTERVENTIONS - Miscellaneous []  - 0 External ear exam []  - 0 Specimen Collection (cultures, biopsies, blood, body fluids, etc.) []  - 0 Specimen(s) / Culture(s) sent or taken to Lab for analysis []  - 0 Patient Transfer (multiple staff / Nurse, Adult / Similar devices) []  - 0 Simple Staple / Suture removal (25 or less) []  - 0 Complex Staple / Suture removal (26 or more) []  - 0 Hypo / Hyperglycemic Management (close monitor of Blood Glucose) KABIR, BRANNOCK ([id number]) [id number] Page 3 of 8 []  - 0 Ankle / Brachial Index (ABI) - do not check if billed separately X- 1 5 Vital Signs Has the patient been seen at the hospital within the last three years: Yes Total Score: 105 Level Of Care: New/Established - Level 3 Electronic Signature(s) Signed: 11/24/2023 6:12:20 PM By: Claven Pollen RN Entered By: Claven Pollen on 11/24/2023 15:07:26 -------------------------------------------------------------------------------- Encounter Discharge Information Details Patient Name: Date of Service: Jason DIERDRE [name] TONNIE S. 11/24/2023 12:30 PM Medical Record Number: [id number] Patient  Account Number: [id number] Date of Birth/Sex: Treating RN: September 14, 1941 (83 y.o. NETTY Claven Pollen Primary Care Lateefah Mallery: Amin, Saad Other Clinician: Referring Duanna Runk: Treating Orvile Corona/Extender: Hoffman, Jessica Amin, Saad Weeks in Treatment: 19 Encounter Discharge Information Items Discharge Condition: Stable Ambulatory Status: Wheelchair Discharge Destination: Home Transportation: Private Auto Accompanied By: spouse Schedule Follow-up Appointment: Yes Clinical Summary of Care: Patient Declined Electronic Signature(s) Signed: 11/24/2023 6:12:20 PM By: Claven Pollen RN Entered By: Claven Pollen on 11/24/2023 18:09:14 -------------------------------------------------------------------------------- Lower Extremity Assessment Details Patient Name: Date of Service: Jason DIERDRE [name] TONNIE [name]. 11/24/2023 12:30 PM Medical Record Number: [id number] Patient Account Number: [id number] Date of Birth/Sex: Treating RN: [birth year] (83 y.o. NETTY Claven Pollen Primary Care Yonis Carreon: Amin, Saad Other Clinician: Referring Sylvain Hasten: Treating Lilyrose Tanney/Extender: Hoffman, Jessica Amin, Saad Weeks in Treatment: 19 Edema Assessment Assessed: [Left: No] [Right: No] Edema: [Left: N] [Right: o] Calf Left: Right: Point of Measurement: 38 cm From Medial Instep 43 cm Ankle Left: Right: Point of Measurement: 11 cm From Medial Instep 28.5 cm Vascular Assessment EUGEN, JEANSONNE ([id number]) [Right:[id number] Page 4 of 8] Pulses: Dorsalis Pedis Palpable: [Right:Yes] Extremity colors, hair growth, and conditions: Extremity Color: [Right:Pale] Hair Growth on Extremity: [Right:No] Temperature of Extremity: [Right:Warm] Dependent Rubor: [Right:No Yes] Electronic Signature(s) Signed: 11/24/2023 6:12:20 PM By: Claven Pollen RN Entered By: Claven Pollen on 11/24/2023 12:56:48 -------------------------------------------------------------------------------- Multi Wound Chart  Details Patient Name: Date of Service: Jason DIERDRE [name] TONNIE S. 11/24/2023 12:30 PM Medical Record Number: [id number] Patient Account Number: [id number] Date of Birth/Sex: Treating RN: 1941-09-30 (83 y.o. M) Primary Care Ely Ballen: Amin, Saad Other Clinician: Referring Mont Jagoda: Treating Gitel Beste/Extender: Hoffman, Jessica Amin, Saad Weeks in Treatment: 19 Vital Signs Height(in): 80 Pulse(bpm): 60 Weight(lbs): 280 Blood Pressure(mmHg): 145/82 Body Mass Index(BMI): 30.8 Temperature(F): 97.7 Respiratory Rate(breaths/min): 16 [5:Photos:] [N/A:N/A] Right Amputation Site - N/A N/A Wound Location: Transmetatarsal Gradually Appeared N/A N/A  Wounding Event: Diabetic Wound/Ulcer of the Lower N/A N/A Primary Etiology: Extremity Sleep Apnea, Arrhythmia, Congestive N/A N/A Comorbid History: Heart Failure, Peripheral Arterial Disease, Peripheral Venous Disease, Type II Diabetes, Neuropathy 04/19/2023 N/A N/A Date Acquired: 50 N/A N/A Weeks of Treatment: Open N/A N/A Wound Status: No N/A N/A Wound Recurrence: 0.9x0.8x0.7 N/A N/A Measurements L x W x D (cm) 0.565 N/A N/A A (cm) : rea 0.396 N/A N/A Volume (cm) : 83.10% N/A N/A % Reduction in A rea: 70.30% N/A N/A % Reduction in Volume: 12 Starting Position 1 (o'clock): 12 Ending Position 1 (o'clock): 0.4 Maximum Distance 1 (cm): Yes N/A N/A Undermining: Grade 2 N/A N/A Classification: Medium N/A N/A Exudate A mount: Serosanguineous N/A N/A Exudate Type: red, brown N/A N/A Exudate Color: Well defined, not attached N/A N/A Wound Margin: Large (67-100%) N/A N/A Granulation A mount: MASAJI, BILLUPS ([id number]) [id number] Page 5 of 8 Red N/A N/A Granulation Quality: Small (1-33%) N/A N/A Necrotic Amount: Eschar N/A N/A Necrotic Tissue: Fat Layer (Subcutaneous Tissue): Yes N/A N/A Exposed Structures: Fascia: No Tendon: No Muscle: No Joint: No Bone: No Small (1-33%) N/A  N/A Epithelialization: Callus: Yes N/A N/A Periwound Skin Texture: Scarring: Yes Dry/Scaly: Yes N/A N/A Periwound Skin Moisture: No Abnormalities Noted N/A N/A Periwound Skin Color: No Abnormality N/A N/A Temperature: Treatment Notes Electronic Signature(s) Signed: 11/29/2023 12:04:19 PM By: Rosan Raisin DO Entered By: Rosan Raisin on 11/24/2023 15:31:22 -------------------------------------------------------------------------------- Multi-Disciplinary Care Plan Details Patient Name: Date of Service: Jason DIERDRE [name] TONNIE S. 11/24/2023 12:30 PM Medical Record Number: [id number] Patient Account Number: [id number] Date of Birth/Sex: Treating RN: 04/04/1941 (83 y.o. NETTY Claven Pollen Primary Care Kristof Nadeem: Amin, Saad Other Clinician: Referring Shayli Altemose: Treating Troye Hiemstra/Extender: Hoffman, Jessica Amin, Saad Weeks in Treatment: 19 Active Inactive Wound/Skin Impairment Nursing Diagnoses: Impaired tissue integrity Goals: Patient/caregiver will verbalize understanding of skin care regimen Date Initiated: 07/13/2023 Target Resolution Date: 01/13/2024 Goal Status: Active Interventions: Assess ulceration(s) every visit Treatment Activities: Skin care regimen initiated : 07/13/2023 Notes: Electronic Signature(s) Signed: 11/24/2023 6:12:20 PM By: Claven Pollen RN Entered By: Claven Pollen on 11/24/2023 18:00:40 -------------------------------------------------------------------------------- Pain Assessment Details Patient Name: Date of Service: Jason DIERDRE [name] TONNIE [name]. 11/24/2023 12:30 PM Jason [name] ([id number]) [id number] Page 6 of 8 Medical Record Number: [id number] Patient Account Number: [id number] Date of Birth/Sex: Treating RN: 10-23-1941 (83 y.o. NETTY Claven Pollen Primary Care Ayisha Pol: Amin, Saad Other Clinician: Referring Staphanie Harbison: Treating Lujean Ebright/Extender: Hoffman, Jessica Amin, Saad Weeks in Treatment: 19 Active Problems Location of Pain  Severity and Description of Pain Patient Has Paino No Site Locations Pain Management and Medication Current Pain Management: Electronic Signature(s) Signed: 11/24/2023 6:12:20 PM By: Claven Pollen RN Entered By: Claven Pollen on 11/24/2023 12:54:55 -------------------------------------------------------------------------------- Patient/Caregiver Education Details Patient Name: Date of Service: Jason DIERDRE [name] TONNIE CANDIE 1/9/2025andnbsp12:30 PM Medical Record Number: [id number] Patient Account Number: [id number] Date of Birth/Gender: Treating RN: 1941/01/20 (83 y.o. NETTY Claven Pollen Primary Care Physician: Amin, Saad Other Clinician: Referring Physician: Treating Physician/Extender: Hoffman, Jessica Amin, Saad Weeks in Treatment: 53 Education Assessment Education Provided To: Patient Education Topics Provided Wound/Skin Impairment: Methods: Explain/Verbal Responses: State content correctly Electronic Signature(s) Signed: 11/24/2023 6:12:20 PM By: Claven Pollen RN Entered By: Claven Pollen on 11/24/2023 18:00:55 Jason [name] ([id number]) [id number] Page 7 of 8 -------------------------------------------------------------------------------- Wound Assessment Details Patient Name: Date of Service: Jason [name], Jason [name] 11/24/2023 12:30 PM Medical Record Number: [id number] Patient Account Number: [id number] Date of Birth/Sex: Treating RN: 1941-06-12 (83 y.o. NETTY Claven Pollen Primary Care  Amaura Authier: Caleen Dirks Other Clinician: Referring Jurnie Garritano: Treating Sims Laday/Extender: Rosan Harlene Caleen Dirks Devra in Treatment: 19 Wound Status Wound Number: 5 Primary Diabetic Wound/Ulcer of the Lower Extremity Etiology: Wound Location: Right Amputation Site - Transmetatarsal Wound Open Wounding Event: Gradually Appeared Status: Date Acquired: 04/19/2023 Comorbid Sleep Apnea, Arrhythmia, Congestive Heart Failure, Peripheral Weeks Of Treatment: 19 History:  Arterial Disease, Peripheral Venous Disease, Type II Diabetes, Clustered Wound: No Neuropathy Photos Wound Measurements Length: (cm) 0.9 Width: (cm) 0.8 Depth: (cm) 0.7 Area: (cm) 0.565 Volume: (cm) 0.396 % Reduction in Area: 83.1% % Reduction in Volume: 70.3% Epithelialization: Small (1-33%) Tunneling: No Undermining: Yes Starting Position (o'clock): 12 Ending Position (o'clock): 12 Maximum Distance: (cm) 0.4 Wound Description Classification: Grade 2 Wound Margin: Well defined, not attached Exudate Amount: Medium Exudate Type: Serosanguineous Exudate Color: red, brown Foul Odor After Cleansing: No Slough/Fibrino Yes Wound Bed Granulation Amount: Large (67-100%) Exposed Structure Granulation Quality: Red Fascia Exposed: No Necrotic Amount: Small (1-33%) Fat Layer (Subcutaneous Tissue) Exposed: Yes Necrotic Quality: Eschar Tendon Exposed: No Muscle Exposed: No Joint Exposed: No Bone Exposed: No Periwound Skin Texture Texture Color No Abnormalities Noted: No No Abnormalities Noted: Yes Callus: Yes Temperature / Pain Scarring: Yes Temperature: No Abnormality Moisture No Abnormalities Noted: No Dry / Scaly: [address] MACKENZY, EISENBERG ([id number]) [id number] Page 8 of 8 Treatment Notes Wound #5 (Amputation Site - Transmetatarsal) Wound Laterality: Right Cleanser Soap and Water Discharge Instruction: May shower and wash wound with dial antibacterial soap and water prior to dressing change. Vashe 5.8 (oz) Discharge Instruction: Cleanse the wound with Vashe prior to applying a clean dressing using gauze sponges, not tissue or cotton balls. Peri-Wound Care Topical Gentamicin Discharge Instruction: As directed by physician directly to wound bed. Primary Dressing Maxorb Extra Ag+ Alginate Dressing, 2x2 (in/in) Discharge Instruction: Apply to wound bed as instructed Secondary Dressing ABD Pad, 5x9 Discharge Instruction: Apply over primary  dressing as directed. Woven Gauze Sponge, Non-Sterile 4x4 in Discharge Instruction: Apply over primary dressing as directed. Secured With Paper Tape, 2x10 (in/yd) Discharge Instruction: Secure dressing with tape as directed. Compression Wrap Compression Stockings Add-Ons Electronic Signature(s) Signed: 11/24/2023 6:12:20 PM By: Claven Pollen RN Entered By: Claven Pollen on 11/24/2023 13:04:42 -------------------------------------------------------------------------------- Vitals Details Patient Name: Date of Service: Jason DIERDRE [name] TONNIE S. 11/24/2023 12:30 PM Medical Record Number: [id number] Patient Account Number: [id number] Date of Birth/Sex: Treating RN: 02/05/1941 (83 y.o. NETTY Claven Pollen Primary Care Emaree Chiu: Amin, Saad Other Clinician: Referring Jamerson Vonbargen: Treating Norvell Ureste/Extender: Hoffman, Jessica Amin, Saad Weeks in Treatment: 19 Vital Signs Time Taken: 12:48 Temperature (F): 97.7 Height (in): 80 Pulse (bpm): 60 Weight (lbs): 280 Respiratory Rate (breaths/min): 16 Body Mass Index (BMI): 30.8 Blood Pressure (mmHg): 145/82 Reference Range: 80 - 120 mg / dl Electronic Signature(s) Signed: 11/24/2023 6:12:20 PM By: Claven Pollen RN Entered By: Claven Pollen on 11/24/2023 12:54:46

## 2023-11-30 NOTE — Progress Notes (Signed)
 ADGER, CANTERA ([id number]) [id number] Page 1 of 8 Visit Report for 11/24/2023 Chief Complaint Document Details Patient Name: Date of Service: Jason [name], Jason [name] 11/24/2023 12:30 PM Medical Record Number: [id number] Patient Account Number: [id number] Date of Birth/Sex: Treating RN: 06-04-1941 (83 y.o. M) Primary Care Provider: Amin, Saad Other Clinician: Referring Provider: Treating Provider/Extender: Jessejames Steelman Amin, Saad Weeks in Treatment: 47 Information Obtained from: Patient Chief Complaint 01/31/17; patient is here for review of wound on his left great toe 07/13/2023; patient comes back to clinic for review of wounds on his right transmetatarsal amputation site Electronic Signature(s) Signed: 11/29/2023 12:04:19 PM By: Rosan Raisin DO Entered By: Rosan Raisin on 11/24/2023 15:34:21 -------------------------------------------------------------------------------- HPI Details Patient Name: Date of Service: Jason DIERDRE RAMAN TONNIE S. 11/24/2023 12:30 PM Medical Record Number: [id number] Patient Account Number: [id number] Date of Birth/Sex: Treating RN: 01-08-1941 (83 y.o. M) Primary Care Provider: Amin, Saad Other Clinician: Referring Provider: Treating Provider/Extender: Dmario Russom Amin, Saad Weeks in Treatment: 19 History of Present Illness Chronic/Inactive Conditions Condition 1: He was followed for a period of time by Dr. Melvenia of vein and vascular as far as I can see last saw him on 1//24. 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 Last arterial studies I see showed noncompressible ABIs bilaterally but multiphasic waveforms on the right but a TBI of 0.44 on the left HPI Description: 01/31/17; this is a 83 year old man who has a history of type 2 diabetes on oral agents. He has a history of wound difficulties however this  is on his right foot in fact he has had amputations of his medial 3 toes. I did not actually look at the right foot today. He tells me about a month ago he was cutting his mycotic nails informed a wound on the medial aspect of the left great toe. He saw Dr. Janit of podiatry. He has been using Neosporin for a while and now simply wrapping it with gauze. He states he does have peripheral neuropathy. His hemoglobin A1c in this clinic was 1. 02/21/1989 non-compressible on the left. He did have vascular studies done in 2015 showing ABIs of 1.4 bilaterally, TBI's of 1.0 on the right 1.1 on the left with triphasic waveforms. He has not had imaging studies that I'm aware of 02/07/17; this is a 83 year old man with type 2 diabetes on oral agents. He came to us  see us  last week with a small open area on the medial aspect of his left great toe. This is in the setting of a significant mycotic nail for which he follows with Dr. Janit of podiatry READMISSION 07/13/2023 This is a now 83 year old man who is had problems with a wound on his right foot amputation site after undergoing a transmetatarsal amputation in May of this year. He was admitted to hospital from 03/29/2023 through 04/02/2023 with sepsis felt to be secondary to an infected wound I think on the right first metatarsal head he underwent a transmetatarsal amputation by podiatry in the hospital. A culture of the wound had previously shown Klebsiella. He was seen in follow-up in the office on 04/19/2023 with a postop wound infection and was given doxycycline  and Augmentin  for 14 days. He has been dressing the wounds with Betadine wet-to-dry. Looking through epic it would appear that the follow-up with the podiatry group has been sporadic. The patient tells me he was at [facility] skilled facility although he is back at home now. His wound now is actually probably between the fourth and fifth metatarsal head on the amputation site. He has a smaller wound  on the dorsal foot in this area and I am sure at 1 time these connected but I could not prove this today. He just came in with kerlix and dry gauze on the wound. He was followed for a period of time by Dr. Melvenia of vein and vascular as far as I can see last saw him on 1//24. 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 Last arterial studies I see showed noncompressible ABIs bilaterally but multiphasic waveforms on the right but a TBI of 0.44 on the left Jason [name], Jason [name] ([id number]) [id number] Page 2 of 8 Past medical history includes type 2 diabetes with peripheral neuropathy and PAD, diastolic heart failure, mitral valve stenosis, atrial fibrillation on Xarelto , sleep apnea ABIs were not repeated today in the clinic as they are previously noncompressible as noted His MRI is listed below; This did not suggest osteomyelitis in the area of the right foot where his wound is currently located MRI 03/29/23 IMPRESSION: 1. Deep skin wound about the plantar aspect of the first metatarsal head with mild marrow edema about the plantar aspect of the first metatarsal head concerning for osteomyelitis. 2. No fluid collection or abscess. 3. Postsurgical changes with prior amputation of the right foot through the metatarsophalangeal joints of the first and fifth digit and through metatarsal heads of the second through fourth digits. 4. Edema of the plantar muscles and tendons suggesting diabetic myopathy/myositis. 07-20-2023 upon evaluation today patient presents for follow-up evaluation here in the clinic. I did review his note from the last visit with Dr. Rufus. With that being said he is a current patient of vein and vascular here in [place] and has seen them last it looks like according to Dr. Marina note in January of this year. At that point he had  multiphasic waveforms on the right with a TBI of 0.44 on the left. I do not see any recent x-rays currently although he has seen podiatry I cannot see any of those x-rays. I really think having him go for a x-ray of the foot would be a good idea. 07-27-2023 upon evaluation today patient appears to be doing well currently in regard to his wound. He is actually showing signs of good improvement. I actually feel like this is significantly improved compared to last week's evaluation he does seem to have good granulation and epithelization and very pleased there is some callus I am going to need to remove however. 08-10-2023 upon evaluation today patient appears to be doing well currently in regard to his wound. The foot actually appears to be showing signs of excellent improvement and I am very pleased in that regard. Fortunately I do not see any evidence of worsening overall and I do believe that the patient is making good headway towards closure. For now I am probably going to recommend that we continue with the Hydrofera Blue although we may make a switch to collagen in the near future depending on how things progress. 08-24-2023 upon evaluation today patient appears to be doing well currently in regard to his wound. He has been tolerating the dressing changes I do believe it may be good to switch over to endoform at this point he  is in agreement with that plan. Fortunately I do not see any evidence of infection or worsening overall and I am pleased in that regard. 08-31-2023 upon evaluation today patient's wound bed actually showed signs of good granulation epithelization at this point. Fortunately I do not see any signs of infection he seems to be doing quite well based on what I am seeing today. 09-07-2023 upon evaluation today patient appears to be doing well currently in regard to his wound. Has been tolerating the dressing changes without complication. Fortunately I do not see any signs of active  infection locally or systemically at this time which is great news. No fevers, chills, nausea, vomiting, or diarrhea. 09-14-2023 upon evaluation today patient appears to be doing excellent in regard to his foot ulcer. This is getting very close to complete resolution. Fortunately I do not see any evidence of worsening overall and I do believe that the patient is making excellent headway towards complete closure. 11/6; this is a wound on the right TMA tip at the level roughly of the fourth metatarsal. Small wound with 0.5 cm of depth we have been using endoform ABDs and gauze. 11/13; right TMA at roughly the fourth metatarsal. The surface callus raised the possibility that there was healing a healed state here. We have been using endoform ABDs and gauze. 11/20; right TMA at roughly the fourth metatarsal again comes in with surface callus. This requires debridement we have been using endoform ABDs and gauze this is being changed daily by a neighbor who has medical training 12/3; right TMA at roughly the fourth metatarsal head. Again a completely nonviable surface with callus dry skin and subcutaneous debris. I used a #3 curette to remove all of this. The wound cleans up quite nicely however it has a worrisome amount of depth. He is a type II diabetic. 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 Malvin. I am assuming this is the only area that remains. 12/11; patient is continually deteriorated with the wound on the plantar aspect of the roughly the right fourth metatarsal and his TMA site. On 11 6 this was down to 0.5 cm in depth it has gotten worse than this with undermining. PCR culture I did last time showed Enterobacter. No resistance genes were detected. We sent in cefdinir 300 twice daily for 10 days but this has been picked up yet. We are also going to use topical  gentamicin 12/19; with the patient was here last week he had developed a major deterioration deeper wound. PCR culture I did showed Enterobacter we have been using topical gentamicin, silver  alginate and oral antibiotics for 10 days. In the interim he seems to have not done well. He had a fall yesterday. He is now apparently under hospice care. The patient states he has generalized weakness. His wife says that he has significant chronic mitral regurgitation that cannot be repaired 11/24/2023; patient presents for follow-up. He has been using gentamicin ointment and silver  alginate to the wound bed. He has no issues or complaints today. He denies signs of infection. Electronic Signature(s) Signed: 11/29/2023 12:04:19 PM By: Rosan Raisin DO Entered By: Rosan Raisin on 11/24/2023 15:37:06 Figg, TONNIE RAMAN ([id number]) [id number] Page 3 of 8 -------------------------------------------------------------------------------- Physical Exam Details Patient Name: Date of Service: Jason [name], Jason [name] 11/24/2023 12:30 PM Medical Record Number: [id number] Patient Account Number: [id number] Date of Birth/Sex: Treating  RN: November 22, 1940 (83 y.o. M) Primary Care Provider: Amin, Saad Other Clinician: Referring Provider: Treating Provider/Extender: Ezekeil Bethel Amin, Saad Weeks in Treatment: 43 Constitutional respirations regular, non-labored and within target range for patient.. Cardiovascular 2+ dorsalis pedis/posterior tibialis pulses. Psychiatric pleasant and cooperative. Notes Pedal pulses heard on Doppler today. Patient has a small punched-out area roughly around the fourth metatarsal head. Granulation tissue at the base. No probing to bone. No signs of infection including increased warmth, erythema or purulent drainage. Electronic Signature(s) Signed: 11/29/2023 12:04:19 PM By: Rosan Raisin DO Entered By: Rosan Raisin on 11/24/2023  15:37:52 -------------------------------------------------------------------------------- Physician Orders Details Patient Name: Date of Service: Jason DIERDRE GORMAN TONNIE GORMAN. 11/24/2023 12:30 PM Medical Record Number: [id number] Patient Account Number: [id number] Date of Birth/Sex: Treating RN: 1941-01-28 (83 y.o. NETTY Claven Pollen Primary Care Provider: Amin, Saad Other Clinician: Referring Provider: Treating Provider/Extender: Kaizen Ibsen Amin, Saad Weeks in Treatment: 73 Verbal / Phone Orders: No Diagnosis Coding Follow-up Appointments ppointment in 2 weeks. - Dr. Rosan Return A Anesthetic (In clinic) Topical Lidocaine  4% applied to wound bed - Used in Clinic Bathing/ Shower/ Hygiene May shower and wash wound with soap and water. - Please keep the wound dressing dry on the days the wound dressing is not being changed Off-Loading Open toe surgical shoe to: - right foot surgical shoe Home Health New wound care orders this week; continue Home Health for wound care. May utilize formulary equivalent dressing for wound treatment orders unless otherwise specified. - Add Gentamicin ointment to wound bed ,followed by Placing Silver  Alginate into the wound bed. Use aBD to cover the top of the foot and finish with Kerlix and tape Other Home Health Orders/Instructions: - Hospice Home Health out of [place]. Wound Treatment Wound #5 - Amputation Site - Transmetatarsal Wound Laterality: Right Cleanser: Soap and Water 2 x Per Week/30 Days Discharge Instructions: May shower and wash wound with dial antibacterial soap and water prior to dressing change. Cleanser: Vashe 5.8 (oz) 2 x Per Week/30 Days Discharge Instructions: Cleanse the wound with Vashe prior to applying a clean dressing using gauze sponges, not tissue or cotton balls. Topical: Gentamicin 2 x Per Week/30 Days Discharge Instructions: As directed by physician directly to wound bed. Jason [name], Jason [name] ([id number])  [id number] Page 4 of 8 Prim Dressing: Maxorb Extra Ag+ Alginate Dressing, 2x2 (in/in) (Generic) 2 x Per Week/30 Days ary Discharge Instructions: Apply to wound bed as instructed Secondary Dressing: ABD Pad, 5x9 (Generic) 2 x Per Week/30 Days Discharge Instructions: Apply over primary dressing as directed. Secondary Dressing: Woven Gauze Sponge, Non-Sterile 4x4 in (Generic) 2 x Per Week/30 Days Discharge Instructions: Apply over primary dressing as directed. Secured With: Paper Tape, 2x10 (in/yd) (Generic) 2 x Per Week/30 Days Discharge Instructions: Secure dressing with tape as directed. Patient Medications llergies: Bactrim  A Notifications Medication Indication Start End 11/24/2023 gentamicin DOSE 1 - topical 0.1 % cream - Apply once daily to the wound bed Electronic Signature(s) Signed: 11/24/2023 6:12:20 PM By: Claven Pollen RN Signed: 11/29/2023 12:04:19 PM By: Rosan Raisin DO Previous Signature: 11/24/2023 3:39:33 PM Version By: Rosan Raisin DO Entered By: Claven Pollen on 11/24/2023 18:08:17 -------------------------------------------------------------------------------- Problem List Details Patient Name: Date of Service: Jason DIERDRE GORMAN TONNIE GORMAN. 11/24/2023 12:30 PM Medical Record Number: [id number] Patient Account Number: [id number] Date of Birth/Sex: Treating RN: 07-21-1941 (83 y.o. M) Primary Care Provider: Amin, Saad Other Clinician: Referring Provider: Treating Provider/Extender: Javeon Macmurray Amin, Saad Weeks in Treatment: 80 Active Problems ICD-10 Encounter Code Description Active Date MDM  Diagnosis E11.621 Type 2 diabetes mellitus with foot ulcer 07/13/2023 No Yes L97.518 Non-pressure chronic ulcer of other part of right foot with other specified 07/13/2023 No Yes severity E11.51 Type 2 diabetes mellitus with diabetic peripheral angiopathy without gangrene 07/13/2023 No Yes E11.42 Type 2 diabetes mellitus with diabetic polyneuropathy  07/13/2023 No Yes Inactive Problems Resolved Problems Electronic Signature(s) Signed: 11/29/2023 12:04:19 PM By: Rosan Harlene ROSALEA JIMMY TONNIE GORMAN ([id number]) PM By: Rosan Harlene DO [id number] Page 5 of 8 Signed: 11/29/2023 12:04:19 Entered By: Rosan Harlene on 11/24/2023 15:31:16 -------------------------------------------------------------------------------- Progress Note Details Patient Name: Date of Service: Jason [name], Jason [name] 11/24/2023 12:30 PM Medical Record Number: [id number] Patient Account Number: [id number] Date of Birth/Sex: Treating RN: [birth year] (83 y.o. M) Primary Care Provider: Amin, Saad Other Clinician: Referring Provider: Treating Provider/Extender: Aniruddh Ciavarella Amin, Saad Weeks in Treatment: 59 Subjective Chief Complaint Information obtained from Patient 01/31/17; patient is here for review of wound on his left great toe 07/13/2023; patient comes back to clinic for review of wounds on his right transmetatarsal amputation site History of Present Illness (HPI) Chronic/Inactive Condition: He was followed for a period of time by Dr. Melvenia of vein and vascular as far as I can see last saw him on 1//24. 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 Last arterial studies I see showed noncompressible ABIs bilaterally but multiphasic waveforms on the right but a TBI of 0.44 on the left 01/31/17; this is a 83 year old man who has a history of type 2 diabetes on oral agents. He has a history of wound difficulties however this is on his right foot in fact he has had amputations of his medial 3 toes. I did not actually look at the right foot today. He tells me about a month ago he was cutting his mycotic nails informed a wound on the medial aspect of the left great toe. He saw Dr. Janit of podiatry. He has been using  Neosporin for a while and now simply wrapping it with gauze. He states he does have peripheral neuropathy. His hemoglobin A1c in this clinic was 1. 02/21/1989 non-compressible on the left. He did have vascular studies done in 2015 showing ABIs of 1.4 bilaterally, TBI's of 1.0 on the right 1.1 on the left with triphasic waveforms. He has not had imaging studies that I'm aware of 02/07/17; this is a 83 year old man with type 2 diabetes on oral agents. He came to us  see us  last week with a small open area on the medial aspect of his left great toe. This is in the setting of a significant mycotic nail for which he follows with Dr. Janit of podiatry READMISSION 07/13/2023 This is a now 83 year old man who is had problems with a wound on his right foot amputation site after undergoing a transmetatarsal amputation in May of this year. He was admitted to hospital from 03/29/2023 through 04/02/2023 with sepsis felt to be secondary to an infected wound I think on the right first metatarsal head he underwent a transmetatarsal amputation by podiatry in the hospital. A culture of the wound had previously shown Klebsiella. He was seen in follow-up in the office on 04/19/2023 with a postop wound infection and was given doxycycline  and Augmentin  for 14 days. He has been dressing the wounds with Betadine wet-to-dry. Looking through epic it would appear that the follow-up with the podiatry group has been  sporadic. The patient tells me he was at [facility] skilled facility although he is back at home now. His wound now is actually probably between the fourth and fifth metatarsal head on the amputation site. He has a smaller wound on the dorsal foot in this area and I am sure at 1 time these connected but I could not prove this today. He just came in with kerlix and dry gauze on the wound. He was followed for a period of time by Dr. Melvenia of vein and vascular as far as I can see last saw him on 1//24. At that time he  was felt to have mild infrainguinal arterial occlusive disease but had multiphasic signals in both feet which should have been sufficient to heal wounds. Last arterial studies I see showed noncompressible ABIs bilaterally but multiphasic waveforms on the right but a TBI of 0.44 on the left Past medical history includes type 2 diabetes with peripheral neuropathy and PAD, diastolic heart failure, mitral valve stenosis, atrial fibrillation on Xarelto , sleep apnea ABIs were not repeated today in the clinic as they are previously noncompressible as noted His MRI is listed below; This did not suggest osteomyelitis in the area of the right foot where his wound is currently located MRI 03/29/23 IMPRESSION: 1. Deep skin wound about the plantar aspect of the first metatarsal head with mild marrow edema about the plantar aspect of the first metatarsal head concerning for osteomyelitis. 2. No fluid collection or abscess. 3. Postsurgical changes with prior amputation of the right foot through the metatarsophalangeal joints of the first and fifth digit and through metatarsal heads of the second through fourth digits. 4. Edema of the plantar muscles and tendons suggesting diabetic myopathy/myositis. 07-20-2023 upon evaluation today patient presents for follow-up evaluation here in the clinic. I did review his note from the last visit with Dr. Rufus. With that being said he is a current patient of vein and vascular here in [place] and has seen them last it looks like according to Dr. Marina note in January of this year. At that point he had multiphasic waveforms on the right with a TBI of 0.44 on the left. I do not see any recent x-rays currently although he has seen podiatry I cannot see any of those x-rays. I really think having him go for a x-ray of the foot would be a good idea. 07-27-2023 upon evaluation today patient appears to be doing well currently in regard to his wound. He is actually showing  signs of good improvement. I actually feel like this is significantly improved compared to last week's evaluation he does seem to have good granulation and epithelization and very pleased there is some callus I am going to need to remove however. Jason [name], Jason [name] ([id number]) [id number] Page 6 of 8 08-10-2023 upon evaluation today patient appears to be doing well currently in regard to his wound. The foot actually appears to be showing signs of excellent improvement and I am very pleased in that regard. Fortunately I do not see any evidence of worsening overall and I do believe that the patient is making good headway towards closure. For now I am probably going to recommend that we continue with the Hydrofera Blue although we may make a switch to collagen in the near future depending on how things progress. 08-24-2023 upon evaluation today patient appears to be doing well currently in regard to his wound. He has been tolerating the dressing changes I do believe it may be good  to switch over to endoform at this point he is in agreement with that plan. Fortunately I do not see any evidence of infection or worsening overall and I am pleased in that regard. 08-31-2023 upon evaluation today patient's wound bed actually showed signs of good granulation epithelization at this point. Fortunately I do not see any signs of infection he seems to be doing quite well based on what I am seeing today. 09-07-2023 upon evaluation today patient appears to be doing well currently in regard to his wound. Has been tolerating the dressing changes without complication. Fortunately I do not see any signs of active infection locally or systemically at this time which is great news. No fevers, chills, nausea, vomiting, or diarrhea. 09-14-2023 upon evaluation today patient appears to be doing excellent in regard to his foot ulcer. This is getting very close to complete resolution. Fortunately I do  not see any evidence of worsening overall and I do believe that the patient is making excellent headway towards complete closure. 11/6; this is a wound on the right TMA tip at the level roughly of the fourth metatarsal. Small wound with 0.5 cm of depth we have been using endoform ABDs and gauze. 11/13; right TMA at roughly the fourth metatarsal. The surface callus raised the possibility that there was healing a healed state here. We have been using endoform ABDs and gauze. 11/20; right TMA at roughly the fourth metatarsal again comes in with surface callus. This requires debridement we have been using endoform ABDs and gauze this is being changed daily by a neighbor who has medical training 12/3; right TMA at roughly the fourth metatarsal head. Again a completely nonviable surface with callus dry skin and subcutaneous debris. I used a #3 curette to remove all of this. The wound cleans up quite nicely however it has a worrisome amount of depth. He is a type II diabetic. 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 Malvin. I am assuming this is the only area that remains. 12/11; patient is continually deteriorated with the wound on the plantar aspect of the roughly the right fourth metatarsal and his TMA site. On 11 6 this was down to 0.5 cm in depth it has gotten worse than this with undermining. PCR culture I did last time showed Enterobacter. No resistance genes were detected. We sent in cefdinir 300 twice daily for 10 days but this has been picked up yet. We are also going to use topical gentamicin 12/19; with the patient was here last week he had developed a major deterioration deeper wound. PCR culture I did showed Enterobacter we have been using topical gentamicin, silver  alginate and oral antibiotics for 10 days. In the interim he seems to have not done well. He had a fall  yesterday. He is now apparently under hospice care. The patient states he has generalized weakness. His wife says that he has significant chronic mitral regurgitation that cannot be repaired 11/24/2023; patient presents for follow-up. He has been using gentamicin ointment and silver  alginate to the wound bed. He has no issues or complaints today. He denies signs of infection. Objective Constitutional respirations regular, non-labored and within target range for patient.. Vitals Time Taken: 12:48 PM, Height: 80 in, Weight: 280 lbs, BMI: 30.8, Temperature: 97.7 F, Pulse: 60 bpm, Respiratory Rate: 16 breaths/min, Blood Pressure: 145/82 mmHg. Cardiovascular 2+ dorsalis pedis/posterior tibialis pulses. Psychiatric pleasant  and cooperative. General Notes: Pedal pulses heard on Doppler today. Patient has a small punched-out area roughly around the fourth metatarsal head. Granulation tissue at the base. No probing to bone. No signs of infection including increased warmth, erythema or purulent drainage. Integumentary (Hair, Skin) Wound #5 status is Open. Original cause of wound was Gradually Appeared. The date acquired was: 04/19/2023. The wound has been in treatment 19 weeks. 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 The wound measures 0.9cm length x 0.8cm width x 0.7cm depth; 0.565cm^2 area and 0.396cm^3 volume. There is Fat Layer (Subcutaneous Tissue) exposed. There is no tunneling noted, however, there is undermining starting at 12:00 and ending at 12:00 with a maximum distance of 0.4cm. There is a medium amount of serosanguineous drainage noted. The wound margin is well defined and not attached to the wound base. There is large (67-100%) red granulation within the wound bed. There is a small (1-33%) amount of necrotic tissue within the wound bed including Eschar. The periwound skin appearance had no abnormalities noted for color. The periwound skin appearance exhibited:  Callus, Scarring, Dry/Scaly. Periwound temperature was noted as No Abnormality. Assessment Jason [name], Jason [name] ([id number]) [id number] Page 7 of 8 Active Problems ICD-10 Type 2 diabetes mellitus with foot ulcer Non-pressure chronic ulcer of other part of right foot with other specified severity Type 2 diabetes mellitus with diabetic peripheral angiopathy without gangrene Type 2 diabetes mellitus with diabetic polyneuropathy Patient's wound is stable. This is over a previous transmetatarsal amputation site. He seems to be doing fine with silver  alginate. He did state he is not using the gentamicin ointment. I went ahead and prescribed this today. This will help with bioburden. He is under hospice care so I will not pursue aggressive care. Plan Follow-up Appointments: Return Appointment in 2 weeks. - Dr. Rosan Anesthetic: (In clinic) Topical Lidocaine  4% applied to wound bed - Used in Clinic Bathing/ Shower/ Hygiene: May shower and wash wound with soap and water. - Please keep the wound dressing dry on the days the wound dressing is not being changed Off-Loading: Open toe surgical shoe to: - right foot surgical shoe Home Health: No change in wound care orders this week; continue Home Health for wound care. May utilize formulary equivalent dressing for wound treatment orders unless otherwise specified. Other Home Health Orders/Instructions: - [facility] out of [place]. The following medication(s) was prescribed: gentamicin topical 0.1 % cream 1 Apply once daily to the wound bed starting 11/24/2023 WOUND #5: - Amputation Site - Transmetatarsal Wound Laterality: Right Cleanser: Soap and Water 2 x Per Week/30 Days Discharge Instructions: May shower and wash wound with dial antibacterial soap and water prior to dressing change. Cleanser: Vashe 5.8 (oz) 2 x Per Week/30 Days Discharge Instructions: Cleanse the wound with Vashe prior to applying a clean dressing  using gauze sponges, not tissue or cotton balls. Topical: Gentamicin 2 x Per Week/30 Days Discharge Instructions: As directed by physician directly to wound bed. Prim Dressing: Maxorb Extra Ag+ Alginate Dressing, 2x2 (in/in) (Generic) 2 x Per Week/30 Days ary Discharge Instructions: Apply to wound bed as instructed Secondary Dressing: ABD Pad, 5x9 (Generic) 2 x Per Week/30 Days Discharge Instructions: Apply over primary dressing as directed. Secondary Dressing: Woven Gauze Sponge, Non-Sterile 4x4 in (Generic) 2 x Per Week/30 Days Discharge Instructions: Apply over primary dressing as directed. Secured With: Paper T ape, 2x10 (in/yd) (Generic) 2 x Per Week/30 Days Discharge Instructions: Secure dressing with tape as directed. 1. Antibiotic  ointment with silver  alginate 2. Follow-up in 2 weeks Electronic Signature(s) Signed: 11/29/2023 12:04:19 PM By: Rosan Raisin DO Entered By: Rosan Raisin on 11/24/2023 15:42:04 -------------------------------------------------------------------------------- SuperBill Details Patient Name: Date of Service: Jason DIERDRE GORMAN TONNIE CANDIE 11/24/2023 Medical Record Number: [id number] Patient Account Number: [id number] Date of Birth/Sex: Treating RN: 1941-01-27 (83 y.o. NETTY Claven Pollen Primary Care Provider: Amin, Saad Other Clinician: Referring Provider: Treating Provider/Extender: Romie Tay Amin, Saad Weeks in Treatment: 19 Diagnosis Coding ICD-10 Codes Code Description E11.621 Type 2 diabetes mellitus with foot ulcer L97.518 Non-pressure chronic ulcer of other part of right foot with other specified severity Jason [name], Jason [name] ([id number]) [id number] Page 8 of 8 E11.51 Type 2 diabetes mellitus with diabetic peripheral angiopathy without gangrene E11.42 Type 2 diabetes mellitus with diabetic polyneuropathy Facility Procedures : CPT4 Code: [id number] Description: 99213 - WOUND CARE VISIT-LEV 3 EST PT Modifier: Quantity:  1 Physician Procedures : CPT4 Code Description Modifier [id number] 99213 - WC PHYS LEVEL 3 - EST PT ICD-10 Diagnosis Description E11.621 Type 2 diabetes mellitus with foot ulcer L97.518 Non-pressure chronic ulcer of other part of right foot with other specified severity E11.51  Type 2 diabetes mellitus with diabetic peripheral angiopathy without gangrene E11.42 Type 2 diabetes mellitus with diabetic polyneuropathy Quantity: 1 Electronic Signature(s) Signed: 11/29/2023 12:04:19 PM By: Rosan Raisin DO Entered By: Rosan Raisin on 11/24/2023 15:42:25

## 2023-12-08 ENCOUNTER — Ambulatory Visit (HOSPITAL_BASED_OUTPATIENT_CLINIC_OR_DEPARTMENT_OTHER): Payer: Medicare HMO | Admitting: Internal Medicine

## 2024-01-29 ENCOUNTER — Encounter (HOSPITAL_COMMUNITY): Payer: Self-pay

## 2024-01-29 ENCOUNTER — Emergency Department (HOSPITAL_COMMUNITY)

## 2024-01-29 ENCOUNTER — Inpatient Hospital Stay (HOSPITAL_COMMUNITY)

## 2024-01-29 ENCOUNTER — Inpatient Hospital Stay (HOSPITAL_COMMUNITY)
Admission: EM | Admit: 2024-01-29 | Discharge: 2024-02-11 | DRG: 208 | Disposition: A | Discharge status: Skilled Nursing Facility | Attending: Student | Admitting: Student

## 2024-01-29 ENCOUNTER — Other Ambulatory Visit: Payer: Self-pay

## 2024-01-29 DIAGNOSIS — I050 Rheumatic mitral stenosis: Secondary | ICD-10-CM | POA: Diagnosis present

## 2024-01-29 DIAGNOSIS — Z7901 Long term (current) use of anticoagulants: Secondary | ICD-10-CM

## 2024-01-29 DIAGNOSIS — I5031 Acute diastolic (congestive) heart failure: Secondary | ICD-10-CM | POA: Diagnosis not present

## 2024-01-29 DIAGNOSIS — G4739 Other sleep apnea: Secondary | ICD-10-CM | POA: Diagnosis not present

## 2024-01-29 DIAGNOSIS — F0390 Unspecified dementia without behavioral disturbance: Secondary | ICD-10-CM | POA: Diagnosis present

## 2024-01-29 DIAGNOSIS — N179 Acute kidney failure, unspecified: Secondary | ICD-10-CM | POA: Diagnosis present

## 2024-01-29 DIAGNOSIS — I5023 Acute on chronic systolic (congestive) heart failure: Secondary | ICD-10-CM | POA: Diagnosis not present

## 2024-01-29 DIAGNOSIS — J42 Unspecified chronic bronchitis: Secondary | ICD-10-CM | POA: Diagnosis not present

## 2024-01-29 DIAGNOSIS — I5082 Biventricular heart failure: Secondary | ICD-10-CM | POA: Diagnosis present

## 2024-01-29 DIAGNOSIS — I739 Peripheral vascular disease, unspecified: Secondary | ICD-10-CM | POA: Diagnosis not present

## 2024-01-29 DIAGNOSIS — Z79891 Long term (current) use of opiate analgesic: Secondary | ICD-10-CM

## 2024-01-29 DIAGNOSIS — I872 Venous insufficiency (chronic) (peripheral): Secondary | ICD-10-CM | POA: Diagnosis present

## 2024-01-29 DIAGNOSIS — M7501 Adhesive capsulitis of right shoulder: Secondary | ICD-10-CM | POA: Insufficient documentation

## 2024-01-29 DIAGNOSIS — J9602 Acute respiratory failure with hypercapnia: Secondary | ICD-10-CM

## 2024-01-29 DIAGNOSIS — M19011 Primary osteoarthritis, right shoulder: Secondary | ICD-10-CM | POA: Diagnosis present

## 2024-01-29 DIAGNOSIS — E16A2 Hypoglycemia level 2: Secondary | ICD-10-CM | POA: Diagnosis not present

## 2024-01-29 DIAGNOSIS — G8929 Other chronic pain: Secondary | ICD-10-CM | POA: Diagnosis present

## 2024-01-29 DIAGNOSIS — J9621 Acute and chronic respiratory failure with hypoxia: Secondary | ICD-10-CM | POA: Diagnosis present

## 2024-01-29 DIAGNOSIS — E785 Hyperlipidemia, unspecified: Secondary | ICD-10-CM | POA: Diagnosis present

## 2024-01-29 DIAGNOSIS — I509 Heart failure, unspecified: Secondary | ICD-10-CM | POA: Diagnosis not present

## 2024-01-29 DIAGNOSIS — D649 Anemia, unspecified: Secondary | ICD-10-CM | POA: Diagnosis present

## 2024-01-29 DIAGNOSIS — Z6834 Body mass index (BMI) 34.0-34.9, adult: Secondary | ICD-10-CM

## 2024-01-29 DIAGNOSIS — J449 Chronic obstructive pulmonary disease, unspecified: Secondary | ICD-10-CM | POA: Diagnosis present

## 2024-01-29 DIAGNOSIS — J810 Acute pulmonary edema: Secondary | ICD-10-CM | POA: Diagnosis not present

## 2024-01-29 DIAGNOSIS — J9622 Acute and chronic respiratory failure with hypercapnia: Secondary | ICD-10-CM | POA: Diagnosis present

## 2024-01-29 DIAGNOSIS — E119 Type 2 diabetes mellitus without complications: Secondary | ICD-10-CM | POA: Diagnosis not present

## 2024-01-29 DIAGNOSIS — Z794 Long term (current) use of insulin: Secondary | ICD-10-CM | POA: Diagnosis not present

## 2024-01-29 DIAGNOSIS — Z87891 Personal history of nicotine dependence: Secondary | ICD-10-CM

## 2024-01-29 DIAGNOSIS — R4182 Altered mental status, unspecified: Secondary | ICD-10-CM

## 2024-01-29 DIAGNOSIS — I4891 Unspecified atrial fibrillation: Secondary | ICD-10-CM | POA: Diagnosis present

## 2024-01-29 DIAGNOSIS — I5033 Acute on chronic diastolic (congestive) heart failure: Secondary | ICD-10-CM | POA: Diagnosis present

## 2024-01-29 DIAGNOSIS — E1151 Type 2 diabetes mellitus with diabetic peripheral angiopathy without gangrene: Secondary | ICD-10-CM | POA: Diagnosis not present

## 2024-01-29 DIAGNOSIS — R570 Cardiogenic shock: Secondary | ICD-10-CM | POA: Diagnosis present

## 2024-01-29 DIAGNOSIS — I110 Hypertensive heart disease with heart failure: Principal | ICD-10-CM | POA: Diagnosis present

## 2024-01-29 DIAGNOSIS — E1165 Type 2 diabetes mellitus with hyperglycemia: Secondary | ICD-10-CM | POA: Diagnosis present

## 2024-01-29 DIAGNOSIS — J90 Pleural effusion, not elsewhere classified: Secondary | ICD-10-CM | POA: Diagnosis not present

## 2024-01-29 DIAGNOSIS — G4733 Obstructive sleep apnea (adult) (pediatric): Secondary | ICD-10-CM | POA: Diagnosis present

## 2024-01-29 DIAGNOSIS — J9601 Acute respiratory failure with hypoxia: Secondary | ICD-10-CM | POA: Diagnosis not present

## 2024-01-29 DIAGNOSIS — A419 Sepsis, unspecified organism: Secondary | ICD-10-CM | POA: Diagnosis present

## 2024-01-29 DIAGNOSIS — I10 Essential (primary) hypertension: Secondary | ICD-10-CM | POA: Diagnosis not present

## 2024-01-29 DIAGNOSIS — I4892 Unspecified atrial flutter: Secondary | ICD-10-CM | POA: Diagnosis present

## 2024-01-29 DIAGNOSIS — M7500 Adhesive capsulitis of unspecified shoulder: Secondary | ICD-10-CM | POA: Diagnosis present

## 2024-01-29 DIAGNOSIS — Z7984 Long term (current) use of oral hypoglycemic drugs: Secondary | ICD-10-CM

## 2024-01-29 DIAGNOSIS — Z833 Family history of diabetes mellitus: Secondary | ICD-10-CM

## 2024-01-29 DIAGNOSIS — Z1152 Encounter for screening for COVID-19: Secondary | ICD-10-CM | POA: Diagnosis not present

## 2024-01-29 DIAGNOSIS — R0902 Hypoxemia: Secondary | ICD-10-CM | POA: Diagnosis not present

## 2024-01-29 DIAGNOSIS — E11649 Type 2 diabetes mellitus with hypoglycemia without coma: Secondary | ICD-10-CM | POA: Diagnosis not present

## 2024-01-29 DIAGNOSIS — R609 Edema, unspecified: Secondary | ICD-10-CM | POA: Diagnosis not present

## 2024-01-29 DIAGNOSIS — G9341 Metabolic encephalopathy: Secondary | ICD-10-CM | POA: Diagnosis present

## 2024-01-29 DIAGNOSIS — I70209 Unspecified atherosclerosis of native arteries of extremities, unspecified extremity: Secondary | ICD-10-CM | POA: Diagnosis not present

## 2024-01-29 DIAGNOSIS — L03116 Cellulitis of left lower limb: Secondary | ICD-10-CM | POA: Diagnosis present

## 2024-01-29 DIAGNOSIS — E66811 Obesity, class 1: Secondary | ICD-10-CM | POA: Diagnosis present

## 2024-01-29 DIAGNOSIS — T68XXXA Hypothermia, initial encounter: Secondary | ICD-10-CM | POA: Diagnosis present

## 2024-01-29 DIAGNOSIS — Z8551 Personal history of malignant neoplasm of bladder: Secondary | ICD-10-CM

## 2024-01-29 DIAGNOSIS — Z79899 Other long term (current) drug therapy: Secondary | ICD-10-CM

## 2024-01-29 DIAGNOSIS — I342 Nonrheumatic mitral (valve) stenosis: Secondary | ICD-10-CM

## 2024-01-29 DIAGNOSIS — R0603 Acute respiratory distress: Secondary | ICD-10-CM | POA: Diagnosis present

## 2024-01-29 HISTORY — DX: Hypoxemia: R09.02

## 2024-01-29 LAB — CBC WITH DIFFERENTIAL/PLATELET
Abs Immature Granulocytes: 0.04 10*3/uL (ref 0.00–0.07)
Basophils Absolute: 0.1 10*3/uL (ref 0.0–0.1)
Basophils Relative: 1 %
Eosinophils Absolute: 0 10*3/uL (ref 0.0–0.5)
Eosinophils Relative: 0 %
HCT: 31.5 % — ABNORMAL LOW (ref 39.0–52.0)
Hemoglobin: 9.6 g/dL — ABNORMAL LOW (ref 13.0–17.0)
Immature Granulocytes: 1 %
Lymphocytes Relative: 8 %
Lymphs Abs: 0.7 10*3/uL (ref 0.7–4.0)
MCH: 26.2 pg (ref 26.0–34.0)
MCHC: 30.5 g/dL (ref 30.0–36.0)
MCV: 86.1 fL (ref 80.0–100.0)
Monocytes Absolute: 1 10*3/uL (ref 0.1–1.0)
Monocytes Relative: 11 %
Neutro Abs: 6.9 10*3/uL (ref 1.7–7.7)
Neutrophils Relative %: 79 %
Platelets: 241 10*3/uL (ref 150–400)
RBC: 3.66 MIL/uL — ABNORMAL LOW (ref 4.22–5.81)
RDW: 15.8 % — ABNORMAL HIGH (ref 11.5–15.5)
WBC: 8.6 10*3/uL (ref 4.0–10.5)
nRBC: 0 % (ref 0.0–0.2)

## 2024-01-29 LAB — I-STAT CHEM 8, ED
BUN: 24 mg/dL — ABNORMAL HIGH (ref 8–23)
Calcium, Ion: 1.19 mmol/L (ref 1.15–1.40)
Chloride: 95 mmol/L — ABNORMAL LOW (ref 98–111)
Creatinine, Ser: 1.4 mg/dL — ABNORMAL HIGH (ref 0.61–1.24)
Glucose, Bld: 276 mg/dL — ABNORMAL HIGH (ref 70–99)
HCT: 38 % — ABNORMAL LOW (ref 39.0–52.0)
Hemoglobin: 12.9 g/dL — ABNORMAL LOW (ref 13.0–17.0)
Potassium: 5 mmol/L (ref 3.5–5.1)
Sodium: 134 mmol/L — ABNORMAL LOW (ref 135–145)
TCO2: 31 mmol/L (ref 22–32)

## 2024-01-29 LAB — GLUCOSE, PLEURAL OR PERITONEAL FLUID: Glucose, Fluid: 260 mg/dL

## 2024-01-29 LAB — I-STAT VENOUS BLOOD GAS, ED
Acid-Base Excess: 0 mmol/L (ref 0.0–2.0)
Bicarbonate: 31.7 mmol/L — ABNORMAL HIGH (ref 20.0–28.0)
Calcium, Ion: 1.2 mmol/L (ref 1.15–1.40)
HCT: 38 % — ABNORMAL LOW (ref 39.0–52.0)
Hemoglobin: 12.9 g/dL — ABNORMAL LOW (ref 13.0–17.0)
O2 Saturation: 88 %
Potassium: 5 mmol/L (ref 3.5–5.1)
Sodium: 134 mmol/L — ABNORMAL LOW (ref 135–145)
TCO2: 35 mmol/L — ABNORMAL HIGH (ref 22–32)
pCO2, Ven: 95.4 mmHg (ref 44–60)
pH, Ven: 7.129 — CL (ref 7.25–7.43)
pO2, Ven: 75 mmHg — ABNORMAL HIGH (ref 32–45)

## 2024-01-29 LAB — I-STAT ARTERIAL BLOOD GAS, ED
Acid-Base Excess: 2 mmol/L (ref 0.0–2.0)
Bicarbonate: 27.1 mmol/L (ref 20.0–28.0)
Calcium, Ion: 1.12 mmol/L — ABNORMAL LOW (ref 1.15–1.40)
HCT: 32 % — ABNORMAL LOW (ref 39.0–52.0)
Hemoglobin: 10.9 g/dL — ABNORMAL LOW (ref 13.0–17.0)
O2 Saturation: 100 %
Patient temperature: 95.3
Potassium: 4.7 mmol/L (ref 3.5–5.1)
Sodium: 133 mmol/L — ABNORMAL LOW (ref 135–145)
TCO2: 28 mmol/L (ref 22–32)
pCO2 arterial: 38.9 mmHg (ref 32–48)
pH, Arterial: 7.443 (ref 7.35–7.45)
pO2, Arterial: 196 mmHg — ABNORMAL HIGH (ref 83–108)

## 2024-01-29 LAB — BODY FLUID CELL COUNT WITH DIFFERENTIAL
Eos, Fluid: 1 %
Lymphs, Fluid: 39 %
Monocyte-Macrophage-Serous Fluid: 7 % — ABNORMAL LOW (ref 50–90)
Neutrophil Count, Fluid: 52 % — ABNORMAL HIGH (ref 0–25)
Other Cells, Fluid: 1 %
Total Nucleated Cell Count, Fluid: 249 uL (ref 0–1000)

## 2024-01-29 LAB — COMPREHENSIVE METABOLIC PANEL
ALT: 39 U/L (ref 0–44)
AST: 49 U/L — ABNORMAL HIGH (ref 15–41)
Albumin: 3.3 g/dL — ABNORMAL LOW (ref 3.5–5.0)
Alkaline Phosphatase: 48 U/L (ref 38–126)
Anion gap: 14 (ref 5–15)
BUN: 25 mg/dL — ABNORMAL HIGH (ref 8–23)
CO2: 27 mmol/L (ref 22–32)
Calcium: 9 mg/dL (ref 8.9–10.3)
Chloride: 92 mmol/L — ABNORMAL LOW (ref 98–111)
Creatinine, Ser: 1.48 mg/dL — ABNORMAL HIGH (ref 0.61–1.24)
GFR, Estimated: 47 mL/min — ABNORMAL LOW (ref >60)
Glucose, Bld: 282 mg/dL — ABNORMAL HIGH (ref 70–99)
Potassium: 4.9 mmol/L (ref 3.5–5.1)
Sodium: 133 mmol/L — ABNORMAL LOW (ref 135–145)
Total Bilirubin: 1 mg/dL (ref 0.0–1.2)
Total Protein: 7.3 g/dL (ref 6.5–8.1)

## 2024-01-29 LAB — URINALYSIS, W/ REFLEX TO CULTURE (INFECTION SUSPECTED)
Bilirubin Urine: NEGATIVE
Glucose, UA: NEGATIVE mg/dL
Hgb urine dipstick: NEGATIVE
Ketones, ur: NEGATIVE mg/dL
Leukocytes,Ua: NEGATIVE
Nitrite: NEGATIVE
Protein, ur: 100 mg/dL — AB
Specific Gravity, Urine: 1.019 (ref 1.005–1.030)
pH: 5 (ref 5.0–8.0)

## 2024-01-29 LAB — RESP PANEL BY RT-PCR (RSV, FLU A&B, COVID)  RVPGX2
Influenza A by PCR: NEGATIVE
Influenza B by PCR: NEGATIVE
Resp Syncytial Virus by PCR: NEGATIVE
SARS Coronavirus 2 by RT PCR: NEGATIVE

## 2024-01-29 LAB — CBC
HCT: 37.2 % — ABNORMAL LOW (ref 39.0–52.0)
Hemoglobin: 10.6 g/dL — ABNORMAL LOW (ref 13.0–17.0)
MCH: 26.3 pg (ref 26.0–34.0)
MCHC: 28.5 g/dL — ABNORMAL LOW (ref 30.0–36.0)
MCV: 92.3 fL (ref 80.0–100.0)
Platelets: 307 10*3/uL (ref 150–400)
RBC: 4.03 MIL/uL — ABNORMAL LOW (ref 4.22–5.81)
RDW: 15.9 % — ABNORMAL HIGH (ref 11.5–15.5)
WBC: 7.9 10*3/uL (ref 4.0–10.5)
nRBC: 0 % (ref 0.0–0.2)

## 2024-01-29 LAB — GLUCOSE, CAPILLARY
Glucose-Capillary: 182 mg/dL — ABNORMAL HIGH (ref 70–99)
Glucose-Capillary: 243 mg/dL — ABNORMAL HIGH (ref 70–99)
Glucose-Capillary: 251 mg/dL — ABNORMAL HIGH (ref 70–99)

## 2024-01-29 LAB — PROTEIN, PLEURAL OR PERITONEAL FLUID: Total protein, fluid: <3 g/dL

## 2024-01-29 LAB — MRSA NEXT GEN BY PCR, NASAL: MRSA by PCR Next Gen: NOT DETECTED

## 2024-01-29 LAB — PROCALCITONIN: Procalcitonin: <0.1 ng/mL

## 2024-01-29 LAB — TROPONIN I (HIGH SENSITIVITY)
Troponin I (High Sensitivity): 40 ng/L — ABNORMAL HIGH (ref <18)
Troponin I (High Sensitivity): 107 ng/L (ref <18)

## 2024-01-29 LAB — BRAIN NATRIURETIC PEPTIDE: B Natriuretic Peptide: 1360.8 pg/mL — ABNORMAL HIGH (ref 0.0–100.0)

## 2024-01-29 LAB — HEMOGLOBIN A1C
Hgb A1c MFr Bld: 7.2 % — ABNORMAL HIGH (ref 4.8–5.6)
Mean Plasma Glucose: 159.94 mg/dL

## 2024-01-29 LAB — LACTIC ACID, PLASMA
Lactic Acid, Venous: 3.8 mmol/L (ref 0.5–1.9)
Lactic Acid, Venous: 2.4 mmol/L (ref 0.5–1.9)

## 2024-01-29 LAB — PROTIME-INR
INR: 1.8 — ABNORMAL HIGH (ref 0.8–1.2)
Prothrombin Time: 20.9 s — ABNORMAL HIGH (ref 11.4–15.2)

## 2024-01-29 LAB — LACTATE DEHYDROGENASE, PLEURAL OR PERITONEAL FLUID: LD, Fluid: 79 U/L — ABNORMAL HIGH (ref 3–23)

## 2024-01-29 LAB — APTT: aPTT: 36 s (ref 24–36)

## 2024-01-29 MED ORDER — SODIUM CHLORIDE 0.9 % IV BOLUS
1000.0000 mL | Freq: Once | INTRAVENOUS | Status: AC
  Administered 2024-01-29: 1000 mL via INTRAVENOUS

## 2024-01-29 MED ORDER — FENTANYL 2500MCG IN NS 250ML (10MCG/ML) PREMIX INFUSION
25.0000 ug/h | INTRAVENOUS | Status: DC
  Administered 2024-01-29 – 2024-01-30 (×2): 50 ug/h via INTRAVENOUS
  Administered 2024-01-31: 75 ug/h via INTRAVENOUS
  Filled 2024-01-29 (×3): qty 250

## 2024-01-29 MED ORDER — ETOMIDATE 2 MG/ML IV SOLN
INTRAVENOUS | Status: AC | PRN
  Administered 2024-01-29: 20 mg via INTRAVENOUS

## 2024-01-29 MED ORDER — CHLORHEXIDINE GLUCONATE CLOTH 2 % EX PADS
6.0000 | MEDICATED_PAD | Freq: Every day | CUTANEOUS | Status: DC
  Administered 2024-01-29 – 2024-02-01 (×4): 6 via TOPICAL

## 2024-01-29 MED ORDER — SODIUM CHLORIDE 0.9 % IV BOLUS (SEPSIS)
1000.0000 mL | Freq: Once | INTRAVENOUS | Status: AC
  Administered 2024-01-29: 1000 mL via INTRAVENOUS

## 2024-01-29 MED ORDER — FENTANYL BOLUS VIA INFUSION
25.0000 ug | INTRAVENOUS | Status: DC | PRN
Start: 2024-01-29 — End: 2024-02-01
  Administered 2024-01-29: 50 ug via INTRAVENOUS
  Administered 2024-01-30: 100 ug via INTRAVENOUS
  Administered 2024-01-30 (×2): 25 ug via INTRAVENOUS
  Administered 2024-01-30: 100 ug via INTRAVENOUS
  Administered 2024-01-30: 50 ug via INTRAVENOUS
  Administered 2024-01-30 – 2024-01-31 (×3): 100 ug via INTRAVENOUS

## 2024-01-29 MED ORDER — DEXMEDETOMIDINE HCL IN NACL 400 MCG/100ML IV SOLN: 0.0000 ug/kg/h | INTRAVENOUS | Status: DC

## 2024-01-29 MED ORDER — CEFTRIAXONE SODIUM 2 G IJ SOLR
2.0000 g | Freq: Once | INTRAMUSCULAR | Status: AC
  Administered 2024-01-29: 2 g via INTRAVENOUS
  Filled 2024-01-29: qty 20

## 2024-01-29 MED ORDER — NOREPINEPHRINE 4 MG/250ML-% IV SOLN
2.0000 ug/min | INTRAVENOUS | Status: DC
  Administered 2024-01-29: 2 ug/min via INTRAVENOUS

## 2024-01-29 MED ORDER — DOCUSATE SODIUM 50 MG/5ML PO LIQD: 100.0000 mg | Freq: Two times a day (BID) | ORAL | Status: DC | PRN

## 2024-01-29 MED ORDER — SODIUM CHLORIDE 0.9 % IV SOLN: 250.0000 mL | INTRAVENOUS | Status: DC

## 2024-01-29 MED ORDER — PROPOFOL 1000 MG/100ML IV EMUL
INTRAVENOUS | Status: AC
  Administered 2024-01-29: 5 ug/kg/min via INTRAVENOUS
  Filled 2024-01-29: qty 100

## 2024-01-29 MED ORDER — FUROSEMIDE 10 MG/ML IJ SOLN
80.0000 mg | Freq: Three times a day (TID) | INTRAMUSCULAR | Status: AC
  Administered 2024-01-29 (×2): 80 mg via INTRAVENOUS
  Filled 2024-01-29 (×2): qty 8

## 2024-01-29 MED ORDER — LINEZOLID 600 MG/300ML IV SOLN
600.0000 mg | Freq: Two times a day (BID) | INTRAVENOUS | Status: DC
  Administered 2024-01-29: 600 mg via INTRAVENOUS
  Filled 2024-01-29 (×3): qty 300

## 2024-01-29 MED ORDER — POLYETHYLENE GLYCOL 3350 17 G PO PACK
17.0000 g | PACK | Freq: Every day | ORAL | Status: DC
  Administered 2024-01-29 – 2024-02-01 (×4): 17 g
  Filled 2024-01-29 (×4): qty 1

## 2024-01-29 MED ORDER — FAMOTIDINE 20 MG PO TABS
20.0000 mg | ORAL_TABLET | Freq: Two times a day (BID) | ORAL | Status: DC
  Administered 2024-01-29 – 2024-02-01 (×7): 20 mg
  Filled 2024-01-29 (×7): qty 1

## 2024-01-29 MED ORDER — SODIUM CHLORIDE 0.9 % IV SOLN
500.0000 mg | Freq: Once | INTRAVENOUS | Status: AC
  Administered 2024-01-29: 500 mg via INTRAVENOUS
  Filled 2024-01-29: qty 5

## 2024-01-29 MED ORDER — SODIUM CHLORIDE 0.9% FLUSH
10.0000 mL | Freq: Three times a day (TID) | INTRAVENOUS | Status: DC
  Administered 2024-01-29 – 2024-02-01 (×9): 10 mL via INTRAPLEURAL

## 2024-01-29 MED ORDER — SODIUM CHLORIDE 0.9 % IV SOLN
2.0000 g | INTRAVENOUS | Status: AC
  Administered 2024-01-30 – 2024-02-05 (×7): 2 g via INTRAVENOUS
  Filled 2024-01-29 (×7): qty 20

## 2024-01-29 MED ORDER — MIDAZOLAM HCL 2 MG/2ML IJ SOLN: 1.0000 mg | INTRAMUSCULAR | Status: DC | PRN

## 2024-01-29 MED ORDER — PROPOFOL 1000 MG/100ML IV EMUL
0.0000 ug/kg/min | INTRAVENOUS | Status: DC
  Administered 2024-01-29: 35 ug/kg/min via INTRAVENOUS
  Administered 2024-01-29: 45 ug/kg/min via INTRAVENOUS
  Administered 2024-01-30: 5 ug/kg/min via INTRAVENOUS
  Administered 2024-01-30: 30 ug/kg/min via INTRAVENOUS
  Administered 2024-01-30 (×2): 45 ug/kg/min via INTRAVENOUS
  Administered 2024-01-31 (×2): 20 ug/kg/min via INTRAVENOUS
  Administered 2024-01-31 (×2): 15 ug/kg/min via INTRAVENOUS
  Administered 2024-02-01: 30 ug/kg/min via INTRAVENOUS
  Filled 2024-01-29 (×10): qty 100

## 2024-01-29 MED ORDER — SODIUM CHLORIDE 0.9 % IV SOLN
100.0000 mg | Freq: Two times a day (BID) | INTRAVENOUS | Status: DC
  Administered 2024-01-30 – 2024-01-31 (×3): 100 mg via INTRAVENOUS
  Filled 2024-01-29 (×4): qty 100

## 2024-01-29 MED ORDER — DOCUSATE SODIUM 50 MG/5ML PO LIQD
100.0000 mg | Freq: Two times a day (BID) | ORAL | Status: DC
  Administered 2024-01-29 – 2024-02-01 (×7): 100 mg
  Filled 2024-01-29 (×7): qty 10

## 2024-01-29 MED ORDER — POLYETHYLENE GLYCOL 3350 17 G PO PACK: 17.0000 g | PACK | Freq: Every day | ORAL | Status: DC | PRN

## 2024-01-29 MED ORDER — ALBUMIN HUMAN 25 % IV SOLN
25.0000 g | Freq: Four times a day (QID) | INTRAVENOUS | Status: AC
  Administered 2024-01-29: 25 g via INTRAVENOUS
  Administered 2024-01-29: 12.5 g via INTRAVENOUS
  Administered 2024-01-30: 25 g via INTRAVENOUS
  Administered 2024-01-30: 12.5 g via INTRAVENOUS
  Filled 2024-01-29 (×4): qty 100

## 2024-01-29 MED ORDER — ROCURONIUM BROMIDE 10 MG/ML (PF) SYRINGE
PREFILLED_SYRINGE | INTRAVENOUS | Status: AC | PRN
  Administered 2024-01-29: 100 mg via INTRAVENOUS

## 2024-01-29 MED ORDER — INSULIN ASPART 100 UNIT/ML IJ SOLN
0.0000 [IU] | INTRAMUSCULAR | Status: DC
  Administered 2024-01-29: 8 [IU] via SUBCUTANEOUS
  Administered 2024-01-29: 3 [IU] via SUBCUTANEOUS

## 2024-01-29 MED ORDER — NOREPINEPHRINE 4 MG/250ML-% IV SOLN
INTRAVENOUS | Status: AC
  Filled 2024-01-29: qty 250

## 2024-01-29 NOTE — ED Provider Notes (Signed)
 [place] EMERGENCY DEPARTMENT AT [facility] Provider Note   CSN: [id number] Arrival date & time: 01/29/24  1454     History  Chief Complaint  Patient presents with   Respiratory Distress   Altered Mental Status    Jason [name] is a 83 y.o. maleWho presented with EMS for respiratory distress/altered mental status.  Family states he was somewhat confused been acting normal and when they arrived first responders stated that his SpO2 was in the 60s.  They witnessed him acting normal but then when he attempted to sit up on the side of the bed, he went unresponsive.  Chest compressions were initiated but when fire/EMS arrived, he had a strong central pulse.  Patient is on 80 mg of Lasix daily.  He is also on Xarelto  PMHx includes T2DM, HLD, PAD, OSA, bladder cancer, A-fib on Xarelto, diastolic CHF, COPD   Altered Mental Status      Home Medications Prior to Admission medications   Medication Sig Start Date End Date Taking? Authorizing Provider  acetaminophen (TYLENOL) 325 MG tablet Take 2 tablets (650 mg total) by mouth every 6 (six) hours as needed for mild pain (or Fever >/= 101). 04/02/23  Yes Marinda Elk, MD  furosemide (LASIX) 40 MG tablet Take 1 tablet (40 mg total) by mouth 2 (two) times daily. Patient taking differently: Take 40 mg by mouth daily. 05/10/23 01/29/24 Yes [place], Eric J, DO  insulin aspart (NOVOLOG) 100 UNIT/ML FlexPen Inject 16 Units into the skin 3 (three) times daily with meals. Patient taking differently: Inject 8 Units into the skin at bedtime as needed for high blood sugar. 05/10/23  Yes [place], Eric J, DO  metFORMIN (GLUCOPHAGE) 500 MG tablet Take 2 tablets (1,000 mg total) by mouth 2 (two) times daily with a meal. Patient taking differently: Take 500 mg by mouth 2 (two) times daily with a meal. 04/02/23  Yes Marinda Elk, MD  metoprolol succinate (TOPROL-XL) 25 MG 24 hr tablet Take 0.5 tablets (12.5 mg total) by mouth daily.  05/10/23 01/29/24 Yes [place], Eric J, DO  morphine (MSIR) 30 MG tablet Take 15 mg by mouth every 6 (six) hours as needed for severe pain (pain score 7-10).   Yes [provider]  rivaroxaban (XARELTO) 20 MG TABS tablet Take 1 tablet (20 mg total) by mouth daily. 10/18/22  Yes Hochrein, Fayrene Fearing, MD  TOUJEO MAX SOLOSTAR 300 UNIT/ML Solostar Pen Inject 40 Units into the skin in the morning. 12/20/23  Yes [provider]  traMADol (ULTRAM) 50 MG tablet Take 50 mg by mouth every 4 (four) hours as needed for moderate pain (pain score 4-6).   Yes [provider]  traZODone (DESYREL) 50 MG tablet Take 50 mg by mouth at bedtime.   Yes [provider]  Vitamin D, Ergocalciferol, (DRISDOL) 1.25 MG (50000 UNIT) CAPS capsule Take 50,000 Units by mouth every Wednesday.   Yes [provider]  B-D UF III MINI PEN NEEDLES 31G X 5 MM MISC  07/10/21   [provider]  Continuous Glucose Sensor (FREESTYLE LIBRE 2 SENSOR) MISC 1 Act by Does not apply route daily. 04/13/23   [name], MD  Insulin Pen Needle (B-D UF III MINI PEN NEEDLES) 31G X 5 MM MISC USE 1 PEN NEEDLE IN THE MORNING AND AT BEDTIME 04/05/23   [name], MD      Allergies    Bactrim [sulfamethoxazole-trimethoprim]    Review of Systems  Review of Systems  Physical Exam Updated Vital Signs BP 134/78   Pulse 62   Temp (!) 96.6 F (35.9 C)   Resp (!) 22   Ht 6\' 5"  (1.956 m)   Wt 120.8 kg   SpO2 100%   BMI 31.58 kg/m  Physical Exam Vitals and nursing note reviewed.  Constitutional:      Appearance: He is obese. He is ill-appearing and toxic-appearing.  HENT:     Head: Normocephalic and atraumatic.     Nose:     Comments: NPA present right nostril    Mouth/Throat:     Lips: Pink. No lesions.     Comments: Blood in the posterior oropharynx Eyes:     Comments: Mucopurulent discharge to bilateral eyes  Cardiovascular:     Rate and Rhythm: Normal rate and regular rhythm.   Pulmonary:     Breath sounds: Examination of the right-upper field reveals decreased breath sounds. Examination of the right-middle field reveals decreased breath sounds. Examination of the right-lower field reveals decreased breath sounds. Decreased breath sounds present. No wheezing.  Abdominal:     General: Abdomen is protuberant.     Tenderness: There is no abdominal tenderness.  Musculoskeletal:     Right lower leg: 3+ Pitting Edema present.     Left lower leg: 3+ Pitting Edema present.  Feet:     Comments: Right foot partial amputation Skin:    General: Skin is cool and moist.  Neurological:     Mental Status: He is lethargic.     GCS: GCS eye subscore is 3. GCS verbal subscore is 1. GCS motor subscore is 5.     ED Results / Procedures / Treatments   Labs (all labs ordered are listed, but only abnormal results are displayed) Labs Reviewed  COMPREHENSIVE METABOLIC PANEL - Abnormal; Notable for the following components:      Result Value   Sodium 133 (*)    Chloride 92 (*)    Glucose, Bld 282 (*)    BUN 25 (*)    Creatinine, Ser 1.48 (*)    Albumin 3.3 (*)    AST 49 (*)    GFR, Estimated 47 (*)    All other components within normal limits  CBC - Abnormal; Notable for the following components:   RBC 4.03 (*)    Hemoglobin 10.6 (*)    HCT 37.2 (*)    MCHC 28.5 (*)    RDW 15.9 (*)    All other components within normal limits  LACTIC ACID, PLASMA - Abnormal; Notable for the following components:   Lactic Acid, Venous 3.8 (*)    All other components within normal limits  BRAIN NATRIURETIC PEPTIDE - Abnormal; Notable for the following components:   B Natriuretic Peptide 1,360.8 (*)    All other components within normal limits  URINALYSIS, W/ REFLEX TO CULTURE (INFECTION SUSPECTED) - Abnormal; Notable for the following components:   Color, Urine AMBER (*)    APPearance HAZY (*)    Protein, ur 100 (*)    Bacteria, UA RARE (*)    All other components within normal  limits  BODY FLUID CELL COUNT WITH DIFFERENTIAL - Abnormal; Notable for the following components:   Appearance, Fluid CLEAR (*)    Neutrophil Count, Fluid 52 (*)    Monocyte-Macrophage-Serous Fluid 7 (*)    All other components within normal limits  LACTATE DEHYDROGENASE, PLEURAL OR PERITONEAL FLUID - Abnormal; Notable for the following components:   LD, Fluid 79 (*)  All other components within normal limits  GLUCOSE, CAPILLARY - Abnormal; Notable for the following components:   Glucose-Capillary 251 (*)    All other components within normal limits  I-STAT VENOUS BLOOD GAS, ED - Abnormal; Notable for the following components:   pH, Ven 7.129 (*)    pCO2, Ven 95.4 (*)    pO2, Ven 75 (*)    Bicarbonate 31.7 (*)    TCO2 35 (*)    Sodium 134 (*)    HCT 38.0 (*)    Hemoglobin 12.9 (*)    All other components within normal limits  I-STAT CHEM 8, ED - Abnormal; Notable for the following components:   Sodium 134 (*)    Chloride 95 (*)    BUN 24 (*)    Creatinine, Ser 1.40 (*)    Glucose, Bld 276 (*)    Hemoglobin 12.9 (*)    HCT 38.0 (*)    All other components within normal limits  I-STAT ARTERIAL BLOOD GAS, ED - Abnormal; Notable for the following components:   pO2, Arterial 196 (*)    Sodium 133 (*)    Calcium, Ion 1.12 (*)    HCT 32.0 (*)    Hemoglobin 10.9 (*)    All other components within normal limits  TROPONIN I (HIGH SENSITIVITY) - Abnormal; Notable for the following components:   Troponin I (High Sensitivity) 40 (*)    All other components within normal limits  RESP PANEL BY RT-PCR (RSV, FLU A&B, COVID)  RVPGX2  MRSA NEXT GEN BY PCR, NASAL  CULTURE, RESPIRATORY W GRAM STAIN  CULTURE, BLOOD (ROUTINE X 2)  CULTURE, BLOOD (ROUTINE X 2)  BODY FLUID CULTURE W GRAM STAIN  GLUCOSE, PLEURAL OR PERITONEAL FLUID  PROTEIN, PLEURAL OR PERITONEAL FLUID  LACTIC ACID, PLASMA  PROTIME-INR  APTT  DIFFERENTIAL  BLOOD GAS, ARTERIAL  CBC  BASIC METABOLIC PANEL  MAGNESIUM   PHOSPHORUS  PROCALCITONIN  TRIGLYCERIDES, BODY FLUIDS  HEMOGLOBIN A1C  TRIGLYCERIDES  CBG MONITORING, ED  I-STAT CG4 LACTIC ACID, ED  I-STAT CG4 LACTIC ACID, ED  CYTOLOGY - NON PAP  TROPONIN I (HIGH SENSITIVITY)    EKG EKG Interpretation Date/Time:  Sunday January 29 2024 15:04:16 EDT Ventricular Rate:  69 PR Interval:    QRS Duration:  142 QT Interval:  506 QTC Calculation: 531 R Axis:   111  Text Interpretation: Atrial fibrillation Nonspecific intraventricular conduction delay PVCs Confirmed by Pricilla Loveless [phone]) on 01/29/2024 3:37:58 PM  Radiology DG CHEST PORT 1 VIEW Result Date: 01/29/2024 CLINICAL DATA:  [id number] Encounter for central line placement [id number] EXAM: PORTABLE CHEST 1 VIEW COMPARISON:  Same day chest x-ray FINDINGS: Interval placement of right subclavian approach central venous catheter with distal tip terminating at the distal SVC. Left IJ central line is again noted to be malpositioned with catheter following the course of the left subclavian vein and terminating in the expected location of the left axillary vein. Endotracheal and enteric tubes remain appropriately positioned. Right chest tube in place. Stable heart size. Patchy bilateral airspace opacities and small bilateral pleural effusions, unchanged. No pneumothorax. IMPRESSION: 1. Interval placement of right subclavian approach central venous catheter with distal tip terminating at the distal SVC. No pneumothorax. 2. Left IJ central line is again noted to be malpositioned with catheter following the course of the left subclavian vein and terminating in the expected location of the left axillary vein. Repositioning is recommended. Electronically Signed   By: Duanne Guess D.O.   On: 01/29/2024 19:11  DG Chest 1 View Result Date: 01/29/2024 CLINICAL DATA:  Central line placement EXAM: CHEST  1 VIEW COMPARISON:  Radiograph same day FINDINGS: LEFT central venous line from IJ approach extends into the LEFT  subclavian vein towards the axilla. Endotracheal tube and NG tube unchanged. Bilateral pleural effusions. RIGHT chest tube in place. No pneumothorax. IMPRESSION: 1. LEFT internal jugular catheter extends into the LEFT axilla through the subclavian vein. 2. Stable support apparatus otherwise. Electronically Signed   By: Genevive Bi M.D.   On: 01/29/2024 18:33   DG CHEST PORT 1 VIEW Result Date: 01/29/2024 CLINICAL DATA:  Central line placement EXAM: PORTABLE CHEST 1 VIEW COMPARISON:  01/29/2024 FINDINGS: High endotracheal 2 is unchanged. No Central line definitively seen. If the central line was placed in the left internal jugular, there appears to be a catheter that loops in the left side of the neck, only partially visualized. Right chest tube is noted. Chest is partially visualized with the lower lobes cut off the image. Bilateral perihilar and lower lobe airspace disease noted. IMPRESSION: Possible left internal jugular central line coiling in the left side of the neck, only partially visualized. The entire chest is not visualized/imaged. Right chest tube partially visualized with bilateral perihilar and lower lobe airspace opacities. No visible pneumothorax. Electronically Signed   By: Charlett Nose M.D.   On: 01/29/2024 18:02   DG Abd Portable 1 View Result Date: 01/29/2024 CLINICAL DATA:  OG placement verification EXAM: PORTABLE ABDOMEN - 1 VIEW COMPARISON:  None Available. FINDINGS: The bowel gas pattern is non-obstructive. No evidence of pneumoperitoneum, within the limitations of a supine film. No acute osseous abnormalities. The soft tissues are within normal limits. Surgical changes, devices, tubes and lines: Enteric tube is seen with its tip and side hole overlying the left upper quadrant, over the region of fundus/cardia of the stomach. Consider further advancement of the tube by 2-4 inches. IMPRESSION: Enteric tube is seen with its tip and side hole overlying the left upper quadrant, over the  region of fundus/cardia of the stomach. Consider further advancement of the tube by 2-4 inches. Electronically Signed   By: Jules Schick M.D.   On: 01/29/2024 15:52   DG Chest Portable 1 View Result Date: 01/29/2024 CLINICAL DATA:  Respiratory distress. Low oxygen saturation. Brief loss of consciousness. EXAM: PORTABLE CHEST 1 VIEW COMPARISON:  04/26/2023. FINDINGS: Low lung volume. There is small-to-moderate layering right pleural effusion. There are probable atelectatic changes at the right lung base. No significant left pleural effusion. No dense consolidation or lung collapse. Stable cardio-mediastinal silhouette. No acute osseous abnormalities. The soft tissues are within normal limits. Endotracheal tube tip is 4.4 cm from the carina. An enteric tube extends below diaphragm, tip out of the view of the radiograph. IMPRESSION: Small-to-moderate layering right pleural effusion. Probable atelectatic changes at the right lung base. Electronically Signed   By: Jules Schick M.D.   On: 01/29/2024 15:51    Procedures Procedure Name: Intubation Date/Time: 01/29/2024 3:24 PM  Performed by: Renella Cunas, MDPre-anesthesia Checklist: Patient identified, Timeout performed, Emergency Drugs available, Suction available and Patient being monitored Oxygen Delivery Method: Ambu bag Preoxygenation: Pre-oxygenation with 100% oxygen Induction Type: Rapid sequence Ventilation: Mask ventilation with difficulty, Two handed mask ventilation required and Nasal airway inserted- appropriate to patient size Laryngoscope Size: Glidescope and 5 Grade View: Grade I Tube size: 8.0 mm Number of attempts: 1 Airway Equipment and Method: Rigid stylet and Video-laryngoscopy Placement Confirmation: ETT inserted through vocal cords under direct vision,  CO2 detector and Breath sounds checked- equal and bilateral Secured at: 26 cm Tube secured with: ETT holder Dental Injury: Teeth and Oropharynx as per pre-operative assessment        Medications Ordered in ED Medications  fentaNYL in NS (66mcg/ml) infusion-PREMIX (100 mcg/hr Intravenous Infusion Verify 01/29/24 1900)  fentaNYL (SUBLIMAZE) bolus via infusion 25-100 mcg (50 mcg Intravenous Bolus from Bag 01/29/24 1736)  midazolam (VERSED) injection 1-2 mg (has no administration in time range)  docusate (COLACE) 50 MG/5ML liquid 100 mg (has no administration in time range)  polyethylene glycol (MIRALAX / GLYCOLAX) packet 17 g (has no administration in time range)  famotidine (PEPCID) tablet 20 mg (has no administration in time range)  furosemide (LASIX) injection 80 mg (80 mg Intravenous Given 01/29/24 1839)  albumin human 25 % solution 25 g ( Intravenous Infusion Verify 01/29/24 1900)  cefTRIAXone (ROCEPHIN) 2 g in sodium chloride 0.9 % 100 mL IVPB (has no administration in time range)  doxycycline (VIBRAMYCIN) 100 mg in sodium chloride 0.9 % 250 mL IVPB (has no administration in time range)  sodium chloride flush (NS) 0.9 % injection 10 mL (10 mLs Intrapleural Given 01/29/24 1835)  linezolid (ZYVOX) IVPB 600 mg (has no administration in time range)  Chlorhexidine Gluconate Cloth 2 % PADS 6 each (6 each Topical Given 01/29/24 1728)  0.9 %  sodium chloride infusion (250 mLs Intravenous Not Given 01/29/24 1738)  norepinephrine (LEVOPHED) 4mg  in (0.016 mg/mL) premix infusion ( Intravenous Not Given 01/29/24 1757)  docusate (COLACE) 50 MG/5ML liquid 100 mg (has no administration in time range)  polyethylene glycol (MIRALAX / GLYCOLAX) packet 17 g (17 g Per Tube Given 01/29/24 1833)  insulin aspart (novoLOG) injection 0-15 Units (8 Units Subcutaneous Given 01/29/24 1836)  propofol (DIPRIVAN) 1000 MG/100ML infusion (35 mcg/kg/min  120.8 kg Intravenous Infusion Verify 01/29/24 1900)  etomidate (AMIDATE) injection (20 mg Intravenous Given 01/29/24 1515)  rocuronium (ZEMURON) injection (100 mg Intravenous Given 01/29/24 1515)  sodium chloride 0.9 % bolus 1,000 mL  (0 mLs Intravenous Stopped 01/29/24 1619)  sodium chloride 0.9 % bolus 1,000 mL (0 mLs Intravenous Stopped 01/29/24 1727)  cefTRIAXone (ROCEPHIN) 2 g in sodium chloride 0.9 % 100 mL IVPB (0 g Intravenous Stopped 01/29/24 1728)  azithromycin (ZITHROMAX) 500 mg in sodium chloride 0.9 % 250 mL IVPB (0 mg Intravenous Stopped 01/29/24 1738)    ED Course/ Medical Decision Making/ A&P Clinical Course as of 01/29/24 1952  Sun Jan 29, 2024  1500 Full code verified with son [RP]    Clinical Course User Index [RP] Rondel Baton, MD                                 Medical Decision Making Amount and/or Complexity of Data Reviewed Labs: ordered. Radiology: ordered.  Risk Prescription drug management. Decision regarding hospitalization.   On arrival, patient was getting bagged with an NPA in his right nostril.  Blood pressure was soft but stable at 123/65.  He was intermittently responsive to loud vocalizations and would open his eyes, however he would immediately closes them and return to somnolence.  Patient is also hypothermic at 93.5.  He feels cool and clammy to the touch.  Initial concern for sepsis versus PE, acute on chronic hypoxia/hypercarbia, ACS, pneumonia, pneumothorax, pleural effusion, stroke, intracranial hemorrhage,  Patient's son was made aware that patient would require a ventilator for life support.  Initial labs notable  for pH of 7.129 and a pCO2 of 95.  This reports concern for acute on chronic hypercarbic respiratory failure.  Other labs that resulted while patient was in the ED were remarkable for no leukocytosis, hemoglobin of 10.6, similar to patient's baseline.  CMP revealed no electrolyte or metabolic derangements, glucose elevated, 282.  Creatinine of 1.48 and BUN of 25 likely small AKI.  Initial troponin 40.  Lactic acid elevated at 3.8.  BNP also elevated at 1360, concern for heart failure exacerbation.  Initial CXR shows right lung with considerable consolidation  concerning for pneumonia.  Given patient's hypothermia, tachypnea and hypoxia, initiated code sepsis with blood cultures, 1 L LR given appearance of significant volume overload on physical exam, and initiation of Rocephin and azithromycin.  Critical care was called for admission.  Patient was discussed with the inpatient team and patient was admitted to the inpatient service with no further emergent intervention required while in the emergency department.  Final Clinical Impression(s) / ED Diagnoses Final diagnoses:  Hypoxia  Acute respiratory failure with hypoxia and hypercarbia (HCC)  Altered mental status, unspecified altered mental status type   Rx / DC Orders ED Discharge Orders     None      Renella Cunas, PGY 2 Emergency medicine   Renella Cunas, MD 01/29/24 Rushie Goltz    Pricilla Loveless, MD 01/29/24 2317

## 2024-01-29 NOTE — ED Notes (Signed)
 RT at [facility] for ABG. BCx1 sent. Pt resting/ sedated, breathing with vent, NAD, calm, VSS.

## 2024-01-29 NOTE — Progress Notes (Addendum)
 eLink Physician-Brief Progress Note Patient Name: AMANTE FOMBY DOB: [birth year] MRN: [id number]   Date of Service  01/29/2024  HPI/Events of Note  [name] just called and said her dad should be a full code, pt's wife has dementia and that the aunt made this decision,  daughter said she would fax [place] a POA paper in the next hour,   BSRN just confused as to what to do if something was to happen  eICU Interventions  Will await MPOA paperwork-for the time being, maintain current CODE STATUS until we have reason to believe otherwise.   2234 - Trend troponin  Intervention Category Minor Interventions: Routine modifications to care plan (e.g. PRN medications for pain, fever)  Kenedee Molesky 01/29/2024, 8:28 PM

## 2024-01-29 NOTE — ED Triage Notes (Signed)
 Pt from home with ems for resp distress/ AMS.  Pt LSN 2pm, HHRN states he was acting normal but SP02 was in the 60s, pt attempted to sit up on the side of the bed when he went unresponsive, HHRN did chest compressions. When Fire arrived pt had a strong carotid pulse. Initial GSC 4, pt improved to an 8 en route. Pt arrived to ed with NPA in place and assisting ventilations with BVM.

## 2024-01-29 NOTE — H&P (Signed)
 NAME:  Jason [name], MRN:  [id number], DOB:  23-Jul-1941, LOS: 0 ADMISSION DATE:  01/29/2024, CONSULTATION DATE:  01/29/2024 REFERRING MD:  Dr. Clemon Chambers - EDP, CHIEF COMPLAINT: Acute respiratory failure  History of Present Illness:  Jason [name] is a 82 year old male with an extensive past medical history significant for but not limited to COPD, HTN, HLD, HFpEF, sleep apnea, diabetes who presented to the ED 3/16 with complaints of acute respiratory distress and altered mental status.  Family reports patient was somewhat confused prompting EMS call, on first responder presentation patient was found hypoxic with SpO2 in the 60s with witnessed loss of consciousness prompting short was compressions, on EMS arrival patient had strong central pulse.  Patient was transported emergently to the ED with bag mask ventilation assistance and intubated emergently on ED arrival.  On ED arrival patient was found hypothermic, tachypneic, mildly hypertensive.  Lab work significant for NA 134, Kos 282, creatinine 1.46, albumin 3.3, GFR 47, BNP 1,360 hemoglobin 10.6 lactic 3.3.  Chest x-ray with small to moderate layering right pleural effusion PCCM consulted for further management assistance  Pertinent  Medical History  COPD, HTN, HLD, HFpEF, sleep apnea, diabetes  Significant Hospital Events: Including procedures, antibiotic start and stop dates in addition to other pertinent events   3/remain16 presented with acute altered mental status and hypoxic respiratory failure, intubated on ED arrival  Interim History / Subjective:  Intubated and sedated  Objective   Blood pressure (!) 144/84, pulse 67, temperature (!) 96.1 F (35.6 C), resp. rate (!) 24, height 6\' 5"  (1.956 m), weight 120.8 kg, SpO2 92%.    Vent Mode: PRVC FiO2 (%):  [100 %] 100 % Set Rate:  [24 bmp] 24 bmp Vt Set:  [650 mL] 650 mL PEEP:  [10 cmH20] 10 cmH20 Plateau Pressure:  [28 cmH20] 28 cmH20  No intake or output data in the 24 hours ending  01/29/24 1604 Filed Weights   01/29/24 1528  Weight: 120.8 kg    Examination: General: Acute on chronic ill-appearing deconditioned elderly male lying in bed on mechanical ventilation in no acute distress HEENT: ETT, MM pink/moist, PERRL,  Neuro: Unresponsive on vent CV: s1s2 regular rate and rhythm, no murmur, rubs, or gallops,  PULM: Bilateral rhonchi, right greater than left, no increased work of breathing, tolerating ventilator GI: soft, bowel sounds active in all 4 quadrants, non-tender, non-distended Extremities: warm/dry, no edema  Skin: no rashes or lesions   Resolved Hospital Problem list     Assessment & Plan:  Acute hypoxic and hypercapnic respiratory failure Right pleural effusion P: Continue ventilator support with lung protective strategies  Wean PEEP and FiO2 for sats greater than 90%. Head of bed elevated 30 degrees. Plateau pressures less than 30 cm H20.  Follow intermittent chest x-ray and ABG.   SAT/SBT as tolerated, mentation preclude extubation  Ensure adequate pulmonary hygiene  Follow cultures  VAP bundle in place  PAD protocol Empiric CAP coverage Diurese as below Placed right smallbore chest tube, follow need for possible pleural lytics  Concern for evolving septic shock -Patient presented hypothermic and tachypneic with positive lactic acid of 3.8 in the setting of acute concern of left lower extremity cellulitis P: Empiric linezolid, Rocephin,and Doxy   Admit ICU Vent support as above Follow cultures  Concern for decompensated HFpEF -Patient presented with elevated BNP with and worsening pleural effusion in the setting of known left ventricular hypertrophy and RV volume overload on most recent echo 04/2023 Mitral valve stenosis Atrial  flutter anticoagulated with Xarelto at baseline P: Continuous telemetry  Trend BMP  Strict intake and output  Daily weight to assess volume status Daily assessment for need to diurese Closely monitor  renal function and electrolytes  Vent support as above Repeat limited echo Consider cardiology consult GDMT as able Consider heparin drip tomorrow  Acute kidney injury -Creatinine 1.48 with GFR 47 on admission compared to creatinine 1.18 with GFR greater than 60 05/2023 P: Follow renal function  Monitor urine output Trend Bmet Avoid nephrotoxins Ensure adequate renal perfusion   Type 2 diabetes P: SSI CBG goal 140-180 CBG checks every 4   Best Practice (right click and "Reselect all SmartList Selections" daily)   Diet/type: NPO DVT prophylaxis SCD Pressure ulcer(s): N/A GI prophylaxis: PPI Lines: N/A Foley:  Yes, and it is still needed Code Status:  full code Last date of multidisciplinary goals of care discussion:Pending   Labs   CBC: Recent Labs  Lab 01/29/24 1500 01/29/24 1515 01/29/24 1550  WBC 7.9  --   --   HGB 10.6* 12.9*  12.9* 10.9*  HCT 37.2* 38.0*  38.0* 32.0*  MCV 92.3  --   --   PLT 307  --   --     Basic Metabolic Panel: Recent Labs  Lab 01/29/24 1500 01/29/24 1515 01/29/24 1550  NA 133* 134*  134* 133*  K 4.9 5.0  5.0 4.7  CL 92* 95*  --   CO2 27  --   --   GLUCOSE 282* 276*  --   BUN 25* 24*  --   CREATININE 1.48* 1.40*  --   CALCIUM 9.0  --   --    GFR: Estimated Creatinine Clearance: 59.6 mL/min (A) (by C-G formula based on SCr of 1.4 mg/dL (H)). Recent Labs  Lab 01/29/24 1500  WBC 7.9  LATICACIDVEN 3.8*    Liver Function Tests: Recent Labs  Lab 01/29/24 1500  AST 49*  ALT 39  ALKPHOS 48  BILITOT 1.0  PROT 7.3  ALBUMIN 3.3*   No results for input(s): "LIPASE", "AMYLASE" in the last 168 hours. No results for input(s): "AMMONIA" in the last 168 hours.  ABG    Component Value Date/Time   PHART 7.443 01/29/2024 1550   PCO2ART 38.9 01/29/2024 1550   PO2ART 196 (H) 01/29/2024 1550   HCO3 27.1 01/29/2024 1550   TCO2 28 01/29/2024 1550   O2SAT 100 01/29/2024 1550     Coagulation Profile: No results for  input(s): "INR", "PROTIME" in the last 168 hours.  Cardiac Enzymes: No results for input(s): "CKTOTAL", "CKMB", "CKMBINDEX", "TROPONINI" in the last 168 hours.  HbA1C: HbA1c, POC (prediabetic range)  Date/Time Value Ref Range Status  08/18/2021 02:40 PM 7.5 (A) 5.7 - 6.4 % Final   HbA1c, POC (controlled diabetic range)  Date/Time Value Ref Range Status  08/18/2021 02:40 PM 7.5 (A) 0.0 - 7.0 % Final   HbA1c POC (<> result, manual entry)  Date/Time Value Ref Range Status  08/18/2021 02:40 PM 7.5 4.0 - 5.6 % Final   Hgb A1c MFr Bld  Date/Time Value Ref Range Status  04/26/2023 09:25 PM 5.8 (H) 4.8 - 5.6 % Final    Comment:    (NOTE) Pre diabetes:          5.7%-6.4%  Diabetes:              >6.4%  Glycemic control for   <7.0% adults with diabetes   03/29/2023 02:12 PM 13.3 (H) 4.6 - 6.5 %  Final    Comment:    Glycemic Control Guidelines for People with Diabetes:Non Diabetic:  <6%Goal of Therapy: <7%Additional Action Suggested:  >8%     CBG: No results for input(s): "GLUCAP" in the last 168 hours.  Review of Systems:   Unable to assess   Past Medical History:  He,  has a past medical history of Anxiety, Asthma, Benign neoplasm of colon, Bone infection (HCC), Cancer (HCC), COPD (chronic obstructive pulmonary disease) (HCC), Depression, Diverticulosis of colon (without mention of hemorrhage), DJD (degenerative joint disease), Esophageal reflux, Glaucoma, Hidradenitis, History of BPH, History of gynecomastia, Hyperlipidemia, Irritable bowel syndrome, Memory loss, Mitral stenosis, MVP (mitral valve prolapse), Obesity, unspecified, Pneumonia, Sleep apnea, Type II or unspecified type diabetes mellitus without mention of complication, not stated as uncontrolled, and Unspecified venous (peripheral) insufficiency.   Surgical History:   Past Surgical History:  Procedure Laterality Date   ACHILLES TENDON SURGERY Right 05/17/2019   Procedure: ACHILLES LENGTHENING/KIDNER;  Surgeon: Park Liter, DPM;  Location: [facility] OR;  Service: Podiatry;  Laterality: Right;   AMPUTATION  08/09/2012   Procedure: AMPUTATION DIGIT;  Surgeon: Nadara Mustard, MD;  Location: [facility] OR;  Service: Orthopedics;  Laterality: Right;  Amputation right Great Toe MTP Joint   AMPUTATION  10/17/2012   Procedure: AMPUTATION DIGIT;  Surgeon: Nadara Mustard, MD;  Location: [facility] OR;  Service: Orthopedics;  Laterality: Right;  Right Foot 3rd Toe Amputation at Metatarsophalangeal Joint   AMPUTATION Right 03/30/2023   Procedure: TRANSMETATARSAL AMPUTATION, RIGHT FOOT;  Surgeon: Pilar Plate, DPM;  Location: [facility];  Service: Podiatry;  Laterality: Right;   AMPUTATION TOE Right 05/17/2019   Procedure: AMPUTATION TOE Metatarsal Phalangeal Joint   FOURTH AND FIFTH, FILLETED TOE FLAP;  Surgeon: Park Liter, DPM;  Location: [facility] OR;  Service: Podiatry;  Laterality: Right;   BUBBLE STUDY  08/12/2021   Procedure: BUBBLE STUDY;  Surgeon: Sande Rives, MD;  Location: [facility] ENDOSCOPY;  Service: Cardiovascular;;   CHOLECYSTECTOMY N/A 06/14/2018   Procedure: LAPAROSCOPIC CHOLECYSTECTOMY WITH INTRAOPERATIVE CHOLANGIOGRAM;  Surgeon: Rodman Pickle, MD;  Location: [facility];  Service: General;  Laterality: N/A;   CIRCUMCISION  06/2100   For Phimosis - Dr Marcello Fennel   COLON SURGERY     CYSTOSCOPY W/ RETROGRADES Bilateral 07/07/2018   Procedure: CYSTOSCOPY WITH BILATERAL RETROGRADE PYELOGRAM;  Surgeon: Crist Fat, MD;  Location: [facility];  Service: Urology;  Laterality: Bilateral;   KNEE ARTHROSCOPY     LAPAROSCOPIC LYSIS OF ADHESIONS  06/14/2018   Procedure: LAPAROSCOPIC LYSIS OF ADHESIONS;  Surgeon: Sheliah Hatch De Blanch, MD;  Location: [facility];  Service: General;;   LAPAROTOMY  07/20/11   LASER ABLATION  06/2009   Left Greater Saphenous vein -Dr Jenean Lindau OSTEOTOMY Right 05/17/2019   Procedure: METATARSLSECTOMY FOURTH DIGIT;  Surgeon: Park Liter, DPM;  Location: [facility];  Service: Podiatry;  Laterality:  Right;   MULTIPLE TOOTH EXTRACTIONS     TEE WITHOUT CARDIOVERSION N/A 08/12/2021   Procedure: TRANSESOPHAGEAL ECHOCARDIOGRAM (TEE);  Surgeon: Sande Rives, MD;  Location: [facility] ENDOSCOPY;  Service: Cardiovascular;  Laterality: N/A;   TOE AMPUTATION  06/2009   x3Right foot second toe -Dr Lestine Box   TONSILLECTOMY     TRANSURETHRAL RESECTION OF BLADDER TUMOR N/A 07/07/2018   Procedure: TRANSURETHRAL RESECTION OF BLADDER TUMOR (TURBT) WITH POST OP INSTILLATION OF GEMCITABIN;  Surgeon: Crist Fat, MD;  Location: [facility];  Service: Urology;  Laterality: N/A;   WOUND DEBRIDEMENT Right 07/25/2019  Procedure: Right foot wound debridement with application of skin graft substitute;  Surgeon: Park Liter, DPM;  Location: [facility];  Service: Podiatry;  Laterality: Right;     Social History:   reports that he quit smoking about 8 years ago. His smoking use included cigarettes. He started smoking about 13 years ago. He has a 0.5 pack-year smoking history. He has never used smokeless tobacco. He reports that he does not currently use alcohol after a past usage of about 7.0 standard drinks of alcohol per week. He reports that he does not use drugs.   Family History:  His family history includes Cancer in his father; Diabetes in his father; High blood pressure in an other family member; Prostate cancer in his father; Stroke in his father. There is no history of Colon cancer, Esophageal cancer, Rectal cancer, or Stomach cancer.   Allergies Allergies  Allergen Reactions   Bactrim [Sulfamethoxazole-Trimethoprim] Rash     Home Medications  Prior to Admission medications   Medication Sig Start Date End Date Taking? Authorizing Provider  acetaminophen (TYLENOL) 325 MG tablet Take 2 tablets (650 mg total) by mouth every 6 (six) hours as needed for mild pain (or Fever >/= 101). 04/02/23   Marinda Elk, MD  B-D UF III MINI PEN NEEDLES 31G X 5 MM MISC  07/10/21   [provider]   Bempedoic Acid-Ezetimibe (NEXLIZET) 180-10 MG TABS Take 1 tablet by mouth daily. 04/21/23   [name], MD  cadexomer iodine (IODOSORB) 0.9 % gel Apply 1 Application topically daily as needed for wound care. 09/29/23   Standiford, Jenelle Mages, DPM  Continuous Glucose Sensor (FREESTYLE LIBRE 2 SENSOR) MISC 1 Act by Does not apply route daily. Patient taking differently: Inject 1 Device into the skin every 14 (fourteen) days. 04/13/23   [name], MD  cyanocobalamin (VITAMIN B12) 500 MCG tablet Take 500 mcg by mouth daily.    [provider]  docusate sodium (COLACE) 100 MG capsule Take 1 capsule (100 mg total) by mouth 2 (two) times daily. 05/10/23   [place], Alvira Philips, DO  furosemide (LASIX) 40 MG tablet Take 1 tablet (40 mg total) by mouth 2 (two) times daily. 05/10/23 08/25/23  [place], Alvira Philips, DO  insulin aspart (NOVOLOG) 100 UNIT/ML FlexPen Inject 16 Units into the skin 3 (three) times daily with meals. 05/10/23   [place], Alvira Philips, DO  Insulin Pen Needle (B-D UF III MINI PEN NEEDLES) 31G X 5 MM MISC USE 1 PEN NEEDLE IN THE MORNING AND AT BEDTIME 04/05/23   [name], MD  metFORMIN (GLUCOPHAGE) 500 MG tablet Take 2 tablets (1,000 mg total) by mouth 2 (two) times daily with a meal. 04/02/23   Marinda Elk, MD  metoprolol succinate (TOPROL-XL) 25 MG 24 hr tablet Take 0.5 tablets (12.5 mg total) by mouth daily. 05/10/23 08/25/23  [place], Alvira Philips, DO  polyethylene glycol (MIRALAX / GLYCOLAX) 17 g packet Take 17 g by mouth daily as needed for mild constipation. 05/10/23   [place], Alvira Philips, DO  rivaroxaban (XARELTO) 20 MG TABS tablet Take 1 tablet (20 mg total) by mouth daily. 10/18/22   [name], MD  rosuvastatin (CRESTOR) 40 MG tablet Take 1 tablet (40 mg total) by mouth daily. 04/08/23 08/25/23  [name], MD  thiamine (VITAMIN B-1) 100 MG tablet Take 100 mg by mouth daily.    [provider]  Vitamin D, Ergocalciferol, (DRISDOL) 1.25 MG (50000 UNIT) CAPS  capsule Take 50,000 Units  by mouth every Wednesday.    [provider]     Critical care time:   CRITICAL CARE Performed by: Mylinh Cragg D. Harris   Total critical care time: 45 minutes  Critical care time was exclusive of separately billable procedures and treating other patients.  Critical care was necessary to treat or prevent imminent or life-threatening deterioration.  Critical care was time spent personally by me on the following activities: development of treatment plan with patient and/or surrogate as well as nursing, discussions with consultants, evaluation of patient's response to treatment, examination of patient, obtaining history from patient or surrogate, ordering and performing treatments and interventions, ordering and review of laboratory studies, ordering and review of radiographic studies, pulse oximetry and re-evaluation of patient's condition.  Rhyse Skowron D. Harris, NP-C [facility] Personal contact information can be found on Amion  If no contact or response made please call [phone] 01/29/2024, 5:15 PM

## 2024-01-29 NOTE — ED Notes (Signed)
 Pt resting on vent, NAD, calm, PCCM at [facility]

## 2024-01-29 NOTE — Procedures (Signed)
 Central Venous Catheter Insertion Procedure Note  Jason [name]  [id number]  1941/11/10  Date:01/29/24  Time:6:51 PM   Provider Performing:Stachia Slutsky Salena Saner Katrinka Blazing   Procedure: Insertion of Non-tunneled Central Venous Catheter(36556) without US guidance  Indication(s) Medication administration  Consent Risks of the procedure as well as the alternatives and risks of each were explained to the patient and/or caregiver.  Consent for the procedure was obtained and is signed in the bedside chart  Anesthesia Topical only with 1% lidocaine   Timeout Verified patient identification, verified procedure, site/side was marked, verified correct patient position, special equipment/implants available, medications/allergies/relevant history reviewed, required imaging and test results available.  Sterile Technique Maximal sterile technique including full sterile barrier drape, hand hygiene, sterile gown, sterile gloves, mask, hair covering, sterile ultrasound probe cover (if used).  Procedure Description Area of catheter insertion was cleaned with chlorhexidine and draped in sterile fashion.  Without real-time ultrasound guidance a central venous catheter was placed into the right subclavian vein. Nonpulsatile blood flow and easy flushing noted in all ports.  The catheter was sutured in place and sterile dressing applied.  Complications/Tolerance None; patient tolerated the procedure well. Chest X-ray is ordered to verify placement for internal jugular or subclavian cannulation.   Chest x-ray is not ordered for femoral cannulation.  EBL Minimal  Specimen(s) None

## 2024-01-29 NOTE — Procedures (Signed)
 Central Venous Catheter Insertion Procedure Note  Jason [name]  [id number]  [birth year]  Date:01/29/24  Time:6:04 PM   Provider Performing:Kirsten Mckone Salena Saner Katrinka Blazing   Procedure: Insertion of Non-tunneled Central Venous [phone]) with US guidance ([id number])   Indication(s) Difficult access  Consent Risks of the procedure as well as the alternatives and risks of each were explained to the patient and/or caregiver.  Consent for the procedure was obtained and is signed in the bedside chart  Anesthesia Topical only with 1% lidocaine   Timeout Verified patient identification, verified procedure, site/side was marked, verified correct patient position, special equipment/implants available, medications/allergies/relevant history reviewed, required imaging and test results available.  Sterile Technique Maximal sterile technique including full sterile barrier drape, hand hygiene, sterile gown, sterile gloves, mask, hair covering, sterile ultrasound probe cover (if used).  Procedure Description Area of catheter insertion was cleaned with chlorhexidine and draped in sterile fashion.  With real-time ultrasound guidance a central venous catheter was placed into the left internal jugular vein. Nonpulsatile blood flow and easy flushing noted in all ports.  The catheter was sutured in place and sterile dressing applied.  Complications/Tolerance None; patient tolerated the procedure well. Chest X-ray is ordered to verify placement for internal jugular or subclavian cannulation.   Chest x-ray is not ordered for femoral cannulation.  EBL Minimal  Specimen(s) None

## 2024-01-29 NOTE — Progress Notes (Signed)
 RT NOTE: RT transported PT on ventilator from room 2M14 to CT and back to room 2M14 with no apparent complications.

## 2024-01-29 NOTE — ED Notes (Signed)
 Wife Jason [name] [phone] would like an update asap

## 2024-01-29 NOTE — Progress Notes (Signed)
 01/29/2024 [facility] noted malpositioned. Okay to draw blood from. Will ask night team to place RIJ. Family updated.  Myrla Halsted MD PCCM

## 2024-01-29 NOTE — Code Documentation (Signed)
 Intubated with 8.0 ETT 26 at the lip, positive color change

## 2024-01-29 NOTE — Sepsis Progress Note (Addendum)
 Elink following code sepsis  1734 Notified bedside nurse of need to draw repeat lactic acid.   1814 Code Sepsis monitoring discontinued due to cancellation.

## 2024-01-29 NOTE — Progress Notes (Signed)
 Patient transported from Trauma room B to 2M14 via the ventilator with no complications. Ventilator power cord plugged into red outlet and air and oxygen hoses connected to wall.

## 2024-01-29 NOTE — Procedures (Signed)
 Insertion of Chest Tube Procedure Note  Jason [name]  [id number]  07-08-1941  Date:01/29/24  Time:6:04 PM    Provider Performing: Lorin Glass   Procedure: Pleural Catheter Insertion w/ Imaging Guidance (40981)  Indication(s) Effusion  Consent Risks of the procedure as well as the alternatives and risks of each were explained to the patient and/or caregiver.  Consent for the procedure was obtained and is signed in the bedside chart  Anesthesia Topical only with 1% lidocaine    Time Out Verified patient identification, verified procedure, site/side was marked, verified correct patient position, special equipment/implants available, medications/allergies/relevant history reviewed, required imaging and test results available.   Sterile Technique Maximal sterile technique including full sterile barrier drape, hand hygiene, sterile gown, sterile gloves, mask, hair covering, sterile ultrasound probe cover (if used).   Procedure Description Ultrasound used to identify appropriate pleural anatomy for placement and overlying skin marked. Area of placement cleaned and draped in sterile fashion.  A 14 French pigtail pleural catheter was placed into the right pleural space using Seldinger technique. Appropriate return of fluid was obtained.  The tube was connected to atrium and placed on -20 cm H2O wall suction.   Complications/Tolerance None; patient tolerated the procedure well. Chest X-ray is ordered to verify placement.   EBL Minimal  Specimen(s) fluid

## 2024-01-29 NOTE — Progress Notes (Addendum)
 DC CODE SEPSIS

## 2024-01-30 ENCOUNTER — Inpatient Hospital Stay (HOSPITAL_COMMUNITY)

## 2024-01-30 DIAGNOSIS — R0603 Acute respiratory distress: Secondary | ICD-10-CM | POA: Diagnosis not present

## 2024-01-30 DIAGNOSIS — J90 Pleural effusion, not elsewhere classified: Secondary | ICD-10-CM | POA: Diagnosis not present

## 2024-01-30 DIAGNOSIS — R609 Edema, unspecified: Secondary | ICD-10-CM | POA: Diagnosis not present

## 2024-01-30 DIAGNOSIS — I5023 Acute on chronic systolic (congestive) heart failure: Secondary | ICD-10-CM | POA: Diagnosis not present

## 2024-01-30 DIAGNOSIS — I509 Heart failure, unspecified: Secondary | ICD-10-CM

## 2024-01-30 DIAGNOSIS — I4891 Unspecified atrial fibrillation: Secondary | ICD-10-CM | POA: Diagnosis not present

## 2024-01-30 DIAGNOSIS — I050 Rheumatic mitral stenosis: Secondary | ICD-10-CM

## 2024-01-30 DIAGNOSIS — J9601 Acute respiratory failure with hypoxia: Secondary | ICD-10-CM | POA: Diagnosis not present

## 2024-01-30 LAB — CBC
HCT: 31.2 % — ABNORMAL LOW (ref 39.0–52.0)
Hemoglobin: 9.9 g/dL — ABNORMAL LOW (ref 13.0–17.0)
MCH: 26.8 pg (ref 26.0–34.0)
MCHC: 31.7 g/dL (ref 30.0–36.0)
MCV: 84.6 fL (ref 80.0–100.0)
Platelets: 285 10*3/uL (ref 150–400)
RBC: 3.69 MIL/uL — ABNORMAL LOW (ref 4.22–5.81)
RDW: 16.2 % — ABNORMAL HIGH (ref 11.5–15.5)
WBC: 7.5 10*3/uL (ref 4.0–10.5)
nRBC: 0 % (ref 0.0–0.2)

## 2024-01-30 LAB — GLUCOSE, CAPILLARY
Glucose-Capillary: 117 mg/dL — ABNORMAL HIGH (ref 70–99)
Glucose-Capillary: 149 mg/dL — ABNORMAL HIGH (ref 70–99)
Glucose-Capillary: 256 mg/dL — ABNORMAL HIGH (ref 70–99)
Glucose-Capillary: 42 mg/dL — CL (ref 70–99)
Glucose-Capillary: 70 mg/dL (ref 70–99)
Glucose-Capillary: 93 mg/dL (ref 70–99)
Glucose-Capillary: 95 mg/dL (ref 70–99)
Glucose-Capillary: 95 mg/dL (ref 70–99)

## 2024-01-30 LAB — BASIC METABOLIC PANEL
Anion gap: 11 (ref 5–15)
BUN: 20 mg/dL (ref 8–23)
CO2: 30 mmol/L (ref 22–32)
Calcium: 9.1 mg/dL (ref 8.9–10.3)
Chloride: 96 mmol/L — ABNORMAL LOW (ref 98–111)
Creatinine, Ser: 1.22 mg/dL (ref 0.61–1.24)
GFR, Estimated: 60 mL/min — ABNORMAL LOW (ref >60)
Glucose, Bld: 97 mg/dL (ref 70–99)
Potassium: 3.4 mmol/L — ABNORMAL LOW (ref 3.5–5.1)
Sodium: 137 mmol/L (ref 135–145)

## 2024-01-30 LAB — ECHOCARDIOGRAM LIMITED
AR max vel: 1.66 cm2
AV Area VTI: 1.7 cm2
AV Area mean vel: 1.7 cm2
AV Mean grad: 12 mmHg
AV Peak grad: 19.6 mmHg
Ao pk vel: 2.22 m/s
Area-P 1/2: 1.79 cm2
Height: 77 in
MV VTI: 0.61 cm2
S' Lateral: 2.7 cm
Single Plane A4C EF: 50.4 %
Weight: 4388.04 [oz_av]

## 2024-01-30 LAB — TRIGLYCERIDES: Triglycerides: 53 mg/dL (ref <150)

## 2024-01-30 LAB — TROPONIN I (HIGH SENSITIVITY): Troponin I (High Sensitivity): 98 ng/L — ABNORMAL HIGH (ref <18)

## 2024-01-30 LAB — MAGNESIUM: Magnesium: 2 mg/dL (ref 1.7–2.4)

## 2024-01-30 LAB — PHOSPHORUS: Phosphorus: 2.2 mg/dL — ABNORMAL LOW (ref 2.5–4.6)

## 2024-01-30 MED ORDER — OSMOLITE 1.5 CAL PO LIQD
1000.0000 mL | ORAL | Status: DC
  Administered 2024-01-30 – 2024-01-31 (×2): 1000 mL
  Filled 2024-01-30 (×5): qty 1000

## 2024-01-30 MED ORDER — INSULIN ASPART 100 UNIT/ML IJ SOLN
0.0000 [IU] | INTRAMUSCULAR | Status: DC
  Administered 2024-01-30: 1 [IU] via SUBCUTANEOUS
  Administered 2024-01-31: 2 [IU] via SUBCUTANEOUS
  Administered 2024-01-31: 1 [IU] via SUBCUTANEOUS
  Administered 2024-01-31: 2 [IU] via SUBCUTANEOUS
  Administered 2024-01-31 (×2): 3 [IU] via SUBCUTANEOUS
  Administered 2024-02-01: 2 [IU] via SUBCUTANEOUS
  Administered 2024-02-01 (×2): 3 [IU] via SUBCUTANEOUS
  Administered 2024-02-01: 2 [IU] via SUBCUTANEOUS
  Administered 2024-02-01 (×3): 3 [IU] via SUBCUTANEOUS
  Administered 2024-02-02 (×2): 1 [IU] via SUBCUTANEOUS

## 2024-01-30 MED ORDER — DEXTROSE 50 % IV SOLN
INTRAVENOUS | Status: AC
  Filled 2024-01-30: qty 50

## 2024-01-30 MED ORDER — PROSOURCE TF20 ENFIT COMPATIBL EN LIQD
60.0000 mL | Freq: Every day | ENTERAL | Status: DC
  Administered 2024-01-30 – 2024-02-01 (×3): 60 mL
  Filled 2024-01-30 (×3): qty 60

## 2024-01-30 MED ORDER — THIAMINE MONONITRATE 100 MG PO TABS
100.0000 mg | ORAL_TABLET | Freq: Every day | ORAL | Status: DC
  Administered 2024-01-30 – 2024-02-01 (×3): 100 mg
  Filled 2024-01-30 (×4): qty 1

## 2024-01-30 MED ORDER — NOREPINEPHRINE 4 MG/250ML-% IV SOLN
0.0000 ug/min | INTRAVENOUS | Status: DC
  Administered 2024-01-30: 11 ug/min via INTRAVENOUS
  Administered 2024-01-30: 9 ug/min via INTRAVENOUS
  Administered 2024-01-31: 7 ug/min via INTRAVENOUS
  Administered 2024-01-31: 10 ug/min via INTRAVENOUS
  Administered 2024-01-31 (×2): 14 ug/min via INTRAVENOUS
  Administered 2024-01-31: 7 ug/min via INTRAVENOUS
  Administered 2024-02-01: 8 ug/min via INTRAVENOUS
  Filled 2024-01-30 (×8): qty 250

## 2024-01-30 MED ORDER — DEXTROSE 50 % IV SOLN
1.0000 | Freq: Once | INTRAVENOUS | Status: AC
  Administered 2024-01-30: 50 mL via INTRAVENOUS

## 2024-01-30 MED ORDER — HEPARIN (PORCINE) 25000 UT/250ML-% IV SOLN
1700.0000 [IU]/h | INTRAVENOUS | Status: DC
  Administered 2024-01-30 – 2024-01-31 (×2): 1700 [IU]/h via INTRAVENOUS
  Filled 2024-01-30 (×2): qty 250

## 2024-01-30 MED ORDER — FUROSEMIDE 10 MG/ML IJ SOLN
80.0000 mg | Freq: Three times a day (TID) | INTRAMUSCULAR | Status: AC
  Administered 2024-01-30 (×2): 80 mg via INTRAVENOUS
  Filled 2024-01-30 (×2): qty 8

## 2024-01-30 MED ORDER — POTASSIUM & SODIUM PHOSPHATES 280-160-250 MG PO PACK
2.0000 | PACK | ORAL | Status: AC
  Administered 2024-01-30 (×3): 2
  Filled 2024-01-30: qty 1
  Filled 2024-01-30 (×2): qty 2

## 2024-01-30 MED ORDER — ORAL CARE MOUTH RINSE
15.0000 mL | OROMUCOSAL | Status: DC
  Administered 2024-01-30 – 2024-02-01 (×25): 15 mL via OROMUCOSAL

## 2024-01-30 MED ORDER — ORAL CARE MOUTH RINSE: 15.0000 mL | OROMUCOSAL | Status: DC | PRN

## 2024-01-30 NOTE — Progress Notes (Signed)
 RT NOTE: patient does not meet SBT criteria for this AM due to PEEP, and FIO2, requirements. Weaned FIO2 to 40%.  Tolerating current ventilator settings well at this time.  Will continue to monitor.

## 2024-01-30 NOTE — Progress Notes (Signed)
 [name] wanted pt to be full code, per conversation with Dr. Eliezer Lofts.  Will switch pt to full code, and follow up with family tomorrow.

## 2024-01-30 NOTE — Progress Notes (Addendum)
 NAME:  Jason [name], MRN:  [id number], DOB:  February 02, 1941, LOS: 1 ADMISSION DATE:  01/29/2024, CONSULTATION DATE:  01/29/2024 REFERRING MD:  Dr. Clemon Chambers - EDP, CHIEF COMPLAINT: Acute respiratory failure  History of Present Illness:  Jason [name] is a 83 year old male with an extensive past medical history significant for but not limited to COPD, HTN, HLD, HFpEF, sleep apnea, diabetes who presented to the ED 3/16 with complaints of acute respiratory distress and altered mental status.  Family reports patient was somewhat confused prompting EMS call, on first responder presentation patient was found hypoxic with SpO2 in the 60s with witnessed loss of consciousness prompting short was compressions, on EMS arrival patient had strong central pulse.  Patient was transported emergently to the ED with bag mask ventilation assistance and intubated emergently on ED arrival.  On ED arrival patient was found hypothermic, tachypneic, mildly hypertensive.  Lab work significant for  NA 134, Kos 282, creatinine 1.46, albumin 3.3, GFR 47, BNP 1,360 hemoglobin 10.6 lactic 3.3.   Chest x-ray with small to moderate layering right pleural effusion PCCM consulted for further management assistance  Pertinent  Medical History  COPD, HTN, HLD, HFpEF, sleep apnea, diabetes  Significant Hospital Events: Including procedures, antibiotic start and stop dates in addition to other pertinent events   3/16 presented with acute altered mental status and hypoxic respiratory failure, intubated on ED arrival  Interim History / Subjective:  Intubated and sedated.  Awake, not following commands.  Objective   Blood pressure (!) 92/50, pulse 64, temperature 98.4 F (36.9 C), resp. rate (!) 22, height 6\' 5"  (1.956 m), weight 124.4 kg, SpO2 100%. CVP:  [16 mmHg-22 mmHg] 16 mmHg  Vent Mode: PRVC FiO2 (%):  [50 %-100 %] 50 % Set Rate:  [22 bmp-24 bmp] 22 bmp Vt Set:  [650 mL-660 mL] 660 mL PEEP:  [10 cmH20] 10 cmH20 Plateau  Pressure:  [24 cmH20-28 cmH20] 24 cmH20   Intake/Output Summary (Last 24 hours) at 01/30/2024 0743 Last data filed at 01/30/2024 0600 Gross per 24 hour  Intake 3567.66 ml  Output 5430 ml  Net -1862.34 ml   Filed Weights   01/29/24 1528 01/30/24 0245  Weight: 120.8 kg 124.4 kg   Examination: General: Acute on chronic ill-appearing deconditioned elderly male lying in bed on mechanical ventilation in no acute distress HEENT: ETT, MM pink/moist, PERRL,  Neuro: Unresponsive on vent CV: s1s2 regular rate and rhythm, no murmur, rubs, or gallops,  PULM: Bilateral rhonchi, right greater than left, no increased work of breathing, tolerating ventilator GI: soft, bowel sounds active in all 4 quadrants, non-tender, non-distended Extremities: warm/dry, no edema  Skin: no rashes or lesions   Resolved Hospital Problem list     Assessment & Plan:  Acute hypoxic and hypercapnic respiratory failure Right pleural effusion Pleural fluid analysis consistent with transudative fluid.  Chest tube with an output >1.6 L.  Pleural fluid culture no growth to date.  Will continue diuresis with IV Lasix, and chest tube. P: Continue ventilator support with lung protective strategies  Wean PEEP and FiO2 for sats greater than 90%. Follow cultures  Placed right smallbore chest tube, follow need for possible pleural lytics  Concern for evolving septic shock MAPS >65 on norepinephrine.  Lactic acid improving 3.8>> 2.2.  Negative blood cultures.  Discontinued linezolid, will continue Doxy for LLE cellulitis.  Vascular US normal.  P: -Continue doxycycline. -Continue vent as above.  Acute on chronic heart failure. Mitral valve stenosis Atrial flutter anticoagulated with  Xarelto at baseline Diuresed with IV Lasix 80 mg TID , with a UOP of 3.2 L. Echo 3/16 showed an LVEF  55 to 60%,with moderate concentric left  ventricular hypertrophy. Right ventricular systolic function is moderately reduced. Holding Xarelto as  pt is not eating.  - Continue diuresis as above. - Pharmacy consult for heparin.  Acute kidney injury Resolved, Scr 1.48>>1.28.   -BMP - Monitor urine output - Avoid nephrotoxins  Type 2 diabetes Hypoglycemic this morning, -  - Decrease Novolog correction to 0-9 units q 4 hrs.  - CBG goal 140-180 - CBG checks every 4  Best Practice (right click and "Reselect all SmartList Selections" daily)   Diet/type: NPO DVT prophylaxis DOAC Pressure ulcer(s): N/A GI prophylaxis: PPI Lines: N/A Foley:  Yes, and it is still needed Code Status:  full code Last date of multidisciplinary goals of care discussion:Pending   Labs   CBC: Recent Labs  Lab 01/29/24 1500 01/29/24 1515 01/29/24 1550 01/29/24 2117 01/30/24 0350  WBC 7.9  --   --  8.6 7.5  NEUTROABS  --   --   --  6.9  --   HGB 10.6* 12.9*  12.9* 10.9* 9.6* 9.9*  HCT 37.2* 38.0*  38.0* 32.0* 31.5* 31.2*  MCV 92.3  --   --  86.1 84.6  PLT 307  --   --  241 285    Basic Metabolic Panel: Recent Labs  Lab 01/29/24 1500 01/29/24 1515 01/29/24 1550 01/30/24 0350  NA 133* 134*  134* 133* 137  K 4.9 5.0  5.0 4.7 3.4*  CL 92* 95*  --  96*  CO2 27  --   --  30  GLUCOSE 282* 276*  --  97  BUN 25* 24*  --  20  CREATININE 1.48* 1.40*  --  1.22  CALCIUM 9.0  --   --  9.1  MG  --   --   --  2.0  PHOS  --   --   --  2.2*   GFR: Estimated Creatinine Clearance: 69.3 mL/min (by C-G formula based on SCr of 1.22 mg/dL). Recent Labs  Lab 01/29/24 1500 01/29/24 2117 01/30/24 0350  PROCALCITON  --  <0.10  --   WBC 7.9 8.6 7.5  LATICACIDVEN 3.8* 2.4*  --     Liver Function Tests: Recent Labs  Lab 01/29/24 1500  AST 49*  ALT 39  ALKPHOS 48  BILITOT 1.0  PROT 7.3  ALBUMIN 3.3*   No results for input(s): "LIPASE", "AMYLASE" in the last 168 hours. No results for input(s): "AMMONIA" in the last 168 hours.  ABG    Component Value Date/Time   PHART 7.443 01/29/2024 1550   PCO2ART 38.9 01/29/2024 1550   PO2ART  196 (H) 01/29/2024 1550   HCO3 27.1 01/29/2024 1550   TCO2 28 01/29/2024 1550   O2SAT 100 01/29/2024 1550     Coagulation Profile: Recent Labs  Lab 01/29/24 2117  INR 1.8*    Cardiac Enzymes: No results for input(s): "CKTOTAL", "CKMB", "CKMBINDEX", "TROPONINI" in the last 168 hours.  HbA1C: HbA1c, POC (prediabetic range)  Date/Time Value Ref Range Status  08/18/2021 02:40 PM 7.5 (A) 5.7 - 6.4 % Final   HbA1c, POC (controlled diabetic range)  Date/Time Value Ref Range Status  08/18/2021 02:40 PM 7.5 (A) 0.0 - 7.0 % Final   HbA1c POC (<> result, manual entry)  Date/Time Value Ref Range Status  08/18/2021 02:40 PM 7.5 4.0 - 5.6 % Final  Hgb A1c MFr Bld  Date/Time Value Ref Range Status  01/29/2024 09:17 PM 7.2 (H) 4.8 - 5.6 % Final    Comment:    (NOTE) Pre diabetes:          5.7%-6.4%  Diabetes:              >6.4%  Glycemic control for   <7.0% adults with diabetes   04/26/2023 09:25 PM 5.8 (H) 4.8 - 5.6 % Final    Comment:    (NOTE) Pre diabetes:          5.7%-6.4%  Diabetes:              >6.4%  Glycemic control for   <7.0% adults with diabetes     CBG: Recent Labs  Lab 01/29/24 1807 01/29/24 1956 01/29/24 2337 01/30/24 0345  GLUCAP 251* 243* 182* 117*    Review of Systems:   Unable to assess   Past Medical History:  He,  has a past medical history of Anxiety, Asthma, Benign neoplasm of colon, Bone infection (HCC), Cancer (HCC), COPD (chronic obstructive pulmonary disease) (HCC), Depression, Diverticulosis of colon (without mention of hemorrhage), DJD (degenerative joint disease), Esophageal reflux, Glaucoma, Hidradenitis, History of BPH, History of gynecomastia, Hyperlipidemia, Hypoxia (01/29/2024), Irritable bowel syndrome, Memory loss, Mitral stenosis, MVP (mitral valve prolapse), Obesity, unspecified, Pneumonia, Sleep apnea, Type II or unspecified type diabetes mellitus without mention of complication, not stated as uncontrolled, and Unspecified  venous (peripheral) insufficiency.   Surgical History:   Past Surgical History:  Procedure Laterality Date   ACHILLES TENDON SURGERY Right 05/17/2019   Procedure: ACHILLES LENGTHENING/KIDNER;  Surgeon: Park Liter, DPM;  Location: [facility];  Service: Podiatry;  Laterality: Right;   AMPUTATION  08/09/2012   Procedure: AMPUTATION DIGIT;  Surgeon: Nadara Mustard, MD;  Location: [facility];  Service: Orthopedics;  Laterality: Right;  Amputation right Great Toe MTP Joint   AMPUTATION  10/17/2012   Procedure: AMPUTATION DIGIT;  Surgeon: Nadara Mustard, MD;  Location: [facility];  Service: Orthopedics;  Laterality: Right;  Right Foot 3rd Toe Amputation at Metatarsophalangeal Joint   AMPUTATION Right 03/30/2023   Procedure: TRANSMETATARSAL AMPUTATION, RIGHT FOOT;  Surgeon: Pilar Plate, DPM;  Location: [facility];  Service: Podiatry;  Laterality: Right;   AMPUTATION TOE Right 05/17/2019   Procedure: AMPUTATION TOE Metatarsal Phalangeal Joint   FOURTH AND FIFTH, FILLETED TOE FLAP;  Surgeon: Park Liter, DPM;  Location: [facility];  Service: Podiatry;  Laterality: Right;   BUBBLE STUDY  08/12/2021   Procedure: BUBBLE STUDY;  Surgeon: Sande Rives, MD;  Location: [facility] ENDOSCOPY;  Service: Cardiovascular;;   CHOLECYSTECTOMY N/A 06/14/2018   Procedure: LAPAROSCOPIC CHOLECYSTECTOMY WITH INTRAOPERATIVE CHOLANGIOGRAM;  Surgeon: Rodman Pickle, MD;  Location: [facility];  Service: General;  Laterality: N/A;   CIRCUMCISION  06/2100   For Phimosis - Dr Marcello Fennel   COLON SURGERY     CYSTOSCOPY W/ RETROGRADES Bilateral 07/07/2018   Procedure: CYSTOSCOPY WITH BILATERAL RETROGRADE PYELOGRAM;  Surgeon: Crist Fat, MD;  Location: [facility];  Service: Urology;  Laterality: Bilateral;   KNEE ARTHROSCOPY     LAPAROSCOPIC LYSIS OF ADHESIONS  06/14/2018   Procedure: LAPAROSCOPIC LYSIS OF ADHESIONS;  Surgeon: Sheliah Hatch De Blanch, MD;  Location: [facility];  Service: General;;   LAPAROTOMY  07/20/11   LASER ABLATION   06/2009   Left Greater Saphenous vein -Dr Jenean Lindau OSTEOTOMY Right 05/17/2019   Procedure: METATARSLSECTOMY FOURTH DIGIT;  Surgeon: Park Liter,  DPM;  Location: [facility];  Service: Podiatry;  Laterality: Right;   MULTIPLE TOOTH EXTRACTIONS     TEE WITHOUT CARDIOVERSION N/A 08/12/2021   Procedure: TRANSESOPHAGEAL ECHOCARDIOGRAM (TEE);  Surgeon: Sande Rives, MD;  Location: [facility] ENDOSCOPY;  Service: Cardiovascular;  Laterality: N/A;   TOE AMPUTATION  06/2009   x3Right foot second toe -Dr Lestine Box   TONSILLECTOMY     TRANSURETHRAL RESECTION OF BLADDER TUMOR N/A 07/07/2018   Procedure: TRANSURETHRAL RESECTION OF BLADDER TUMOR (TURBT) WITH POST OP INSTILLATION OF GEMCITABIN;  Surgeon: Crist Fat, MD;  Location: [facility];  Service: Urology;  Laterality: N/A;   WOUND DEBRIDEMENT Right 07/25/2019   Procedure: Right foot wound debridement with application of skin graft substitute;  Surgeon: Park Liter, DPM;  Location: [facility];  Service: Podiatry;  Laterality: Right;     Social History:   reports that he quit smoking about 8 years ago. His smoking use included cigarettes. He started smoking about 13 years ago. He has a 0.5 pack-year smoking history. He has never used smokeless tobacco. He reports that he does not currently use alcohol after a past usage of about 7.0 standard drinks of alcohol per week. He reports that he does not use drugs.   Family History:  His family history includes Cancer in his father; Diabetes in his father; High blood pressure in an other family member; Prostate cancer in his father; Stroke in his father. There is no history of Colon cancer, Esophageal cancer, Rectal cancer, or Stomach cancer.   Allergies Allergies  Allergen Reactions   Bactrim [Sulfamethoxazole-Trimethoprim] Rash     Home Medications  Prior to Admission medications   Medication Sig Start Date End Date Taking? Authorizing Provider  acetaminophen (TYLENOL) 325 MG tablet Take 2  tablets (650 mg total) by mouth every 6 (six) hours as needed for mild pain (or Fever >/= 101). 04/02/23   Marinda Elk, MD  B-D UF III MINI PEN NEEDLES 31G X 5 MM MISC  07/10/21   [provider]  Bempedoic Acid-Ezetimibe (NEXLIZET) 180-10 MG TABS Take 1 tablet by mouth daily. 04/21/23   [name], MD  cadexomer iodine (IODOSORB) 0.9 % gel Apply 1 Application topically daily as needed for wound care. 09/29/23   Standiford, Jenelle Mages, DPM  Continuous Glucose Sensor (FREESTYLE LIBRE 2 SENSOR) MISC 1 Act by Does not apply route daily. Patient taking differently: Inject 1 Device into the skin every 14 (fourteen) days. 04/13/23   [name], MD  cyanocobalamin (VITAMIN B12) 500 MCG tablet Take 500 mcg by mouth daily.    [provider]  docusate sodium (COLACE) 100 MG capsule Take 1 capsule (100 mg total) by mouth 2 (two) times daily. 05/10/23   [place], Alvira Philips, DO  furosemide (LASIX) 40 MG tablet Take 1 tablet (40 mg total) by mouth 2 (two) times daily. 05/10/23 08/25/23  [place], Alvira Philips, DO  insulin aspart (NOVOLOG) 100 UNIT/ML FlexPen Inject 16 Units into the skin 3 (three) times daily with meals. 05/10/23   [place], Alvira Philips, DO  Insulin Pen Needle (B-D UF III MINI PEN NEEDLES) 31G X 5 MM MISC USE 1 PEN NEEDLE IN THE MORNING AND AT BEDTIME 04/05/23   [name], MD  metFORMIN (GLUCOPHAGE) 500 MG tablet Take 2 tablets (1,000 mg total) by mouth 2 (two) times daily with a meal. 04/02/23   Marinda Elk, MD  metoprolol succinate (TOPROL-XL) 25 MG 24 hr tablet Take 0.5 tablets (12.5 mg total)  by mouth daily. 05/10/23 08/25/23  [place], Alvira Philips, DO  polyethylene glycol (MIRALAX / GLYCOLAX) 17 g packet Take 17 g by mouth daily as needed for mild constipation. 05/10/23   [place], Alvira Philips, DO  rivaroxaban (XARELTO) 20 MG TABS tablet Take 1 tablet (20 mg total) by mouth daily. 10/18/22   [name], MD  rosuvastatin (CRESTOR) 40 MG tablet Take 1 tablet (40 mg total) by  mouth daily. 04/08/23 08/25/23  [name], MD  thiamine (VITAMIN B-1) 100 MG tablet Take 100 mg by mouth daily.    [provider]  Vitamin D, Ergocalciferol, (DRISDOL) 1.25 MG (50000 UNIT) CAPS capsule Take 50,000 Units by mouth every Wednesday.    [provider]     Laretta Bolster, M.D.  Internal Medicine Resident, PGY-1 [facility]  Pager: [phone] 10:41 AM, 01/30/2024   **Please contact the on call pager after 5 pm and on weekends at [phone].**

## 2024-01-30 NOTE — Plan of Care (Signed)
  Problem: Clinical Measurements: Goal: Will remain free from infection Outcome: Progressing Goal: Respiratory complications will improve Outcome: Progressing   Problem: Activity: Goal: Risk for activity intolerance will decrease Outcome: Progressing   Problem: Pain Managment: Goal: General experience of comfort will improve and/or be controlled Outcome: Progressing   Problem: Skin Integrity: Goal: Risk for impaired skin integrity will decrease Outcome: Progressing

## 2024-01-30 NOTE — Progress Notes (Signed)
 Latest Reference Range & Units 01/30/24 07:55  Glucose-Capillary 70 - 99 mg/dL 42 (LL)  (LL): Data is critically low Durel Salts, MD made aware. Hypoglycemic protocol put into place. 1 ampule of D50 given. Blood sugar recheck at 0826 is 93.

## 2024-01-30 NOTE — Progress Notes (Signed)
 Initial Nutrition Assessment  DOCUMENTATION CODES:  Not applicable  INTERVENTION:  Initiate tube feeding via OGT: Osmolite 1.5 at 60 ml/h (1440 ml per day) Start at 30 and advance by 10 q8h to goal Prosource TF20 60 ml 1x/d Provides 2240 kcal, 110 gm protein, 1097 ml free water daily Thiamine 100mg  x 5 days for drop in low K and phosphorus in the event that pt is refeeding  NUTRITION DIAGNOSIS:  Inadequate oral intake related to inability to eat as evidenced by NPO status.  GOAL:  Patient will meet greater than or equal to 90% of their needs  MONITOR:  TF tolerance, I & O's, Vent status, Labs  REASON FOR ASSESSMENT:  Ventilator    ASSESSMENT:  Pt with hx of COPD, HTN, HLD, CHF, GERD, IBS, hx bladder cancer, and DM type 2 presented to ED with AMS and respiratory failure. Intubated upon arrival to ED  3/16, presented to ED, intubated, chest tube placed  Patient is currently intubated on ventilator support. No family at bedside at the time of assessment to provide a history. On exam, pt appears well nourished but noted that phosphorus and K low on labs this AM. Replacements ordered.   Noted CBGs in the lower range. Discussed with MD, ok to begin enteral feeds. Hopeful to try and attempt extubation tomorrow.   MV: 9.5 L/min Temp (24hrs), Avg:98.4 F (36.9 C), Min:97.3 F (36.3 C), Max:99.5 F (37.5 C) MAP (cuff):  Propofol: 10.87 ml/hr (287 kcal/d)  Admit weight: 120.8 kg  Current weight: 124.4 kg Weight appears stable in hx   Intake/Output Summary (Last 24 hours) at 01/30/2024 1654 Last data filed at 01/30/2024 1600 Gross per 24 hour  Intake 3699.6 ml  Output 6655 ml  Net -2955.4 ml  Net IO Since Admission: -1,955.4 mL [01/30/24 1654]  Drains/Lines: CVC Triple lumen right subclavian OGT 18 Fr. UOP out x 24 hours Chest Tube right x 24 hours  Nutritionally Relevant Medications: Scheduled Meds:  docusate  100 mg Per Tube BID   famotidine   20 mg Per Tube BID   furosemide  80 mg Intravenous Q8H   insulin aspart  0-9 Units Subcutaneous Q4H   polyethylene glycol  17 g Per Tube Daily   Continuous Infusions:  cefTRIAXone (ROCEPHIN)  IV Stopped (01/30/24 0909)   doxycycline (VIBRAMYCIN) IV Stopped (01/30/24 1129)   norepinephrine (LEVOPHED) Adult infusion 7 mcg/min (01/30/24 1600)   propofol (DIPRIVAN) infusion 15 mcg/kg/min (01/30/24 1600)   PRN Meds:.dextrose, docusate, polyethylene glycol  Labs Reviewed: K 3.4 Chloride 96 Phosphorus 2.2 CBG ranges from 42-251 mg/dL over the last 24 hours HgbA1c 7.2%  NUTRITION - FOCUSED PHYSICAL EXAM: Flowsheet Row Most Recent Value  Orbital Region No depletion  Upper Arm Region No depletion  Thoracic and Lumbar Region No depletion  Buccal Region No depletion  Temple Region No depletion  Clavicle Bone Region No depletion  Clavicle and Acromion Bone Region Mild depletion  Scapular Bone Region No depletion  Dorsal Hand No depletion  Patellar Region No depletion  Anterior Thigh Region No depletion  Posterior Calf Region No depletion  Edema (RD Assessment) Mild  [BLE]  Hair Reviewed  Eyes Reviewed  Mouth Reviewed  Skin Reviewed  Nails Reviewed   Diet Order:   Diet Order             Diet NPO time specified  Diet effective now  EDUCATION NEEDS:  Not appropriate for education at this time  Skin:  Skin Assessment: Reviewed RN Assessment  Last BM:  unsure  Height:  Ht Readings from Last 1 Encounters:  01/29/24 6\' 5"  (1.956 m)    Weight:  Wt Readings from Last 1 Encounters:  01/30/24 124.4 kg    Ideal Body Weight:  94.5 kg  BMI:  Body mass index is 32.52 kg/m.  Estimated Nutritional Needs:  Kcal:  2200-2400 kcal/d Protein:  110-125g/d Fluid:  2.4L/d    Greig Castilla, RD, LDN Registered Dietitian II Please reach out via secure chat Weekend on-call pager # available in [facility]

## 2024-01-30 NOTE — TOC Initial Note (Addendum)
 Transition of Care [employer]) - Initial/Assessment Note    Patient Details  Name: Jason [name] MRN: [id number] Date of Birth: 01-01-1941  [facility] of [facility] CM/SW Contact:    Jason Coots, LCSW Phone Number: 01/30/2024, 12:20 PM  Clinical Narrative:                  12:21 PM Per progressions, patient's daughter, Jason [name], is HCPOA and is to fax documentation upon return home (currently out of town). CSW attempted to call patient's spouse, Jason [name], as [name]'s phone number on chart is invalid, but was unable to contact Jason [name] due to caller restrictions. CSW made medical team aware.  12:51 PM Per bedside RN, Licensed conveyancer received HCPOA documentation that is to be placed in chart.    Barriers to Discharge: Continued Medical Work up   Patient Goals and CMS Choice            Expected Discharge Plan and Services       Living arrangements for the past 2 months: Single Family Home                                      Prior Living Arrangements/Services Living arrangements for the past 2 months: Single Family Home   Patient language and need for interpreter reviewed:: Yes        Need for Family Participation in Patient Care: Yes (Comment) Care giver support system in place?: Yes (comment)   Criminal Activity/Legal Involvement Pertinent to Current Situation/Hospitalization: No - Comment as needed  Activities of Daily Living      Permission Sought/Granted Permission sought to share information with : Family Supports Permission granted to share information with : No (Contact information on chart)  Share Information with NAME: Jason [name]     Permission granted to share info w Relationship: Spouse  Permission granted to share info w Contact Information: [phone]  Emotional Assessment   Attitude/Demeanor/Rapport: Unable to Assess, Intubated (Following Commands or Not Following Commands) Affect (typically observed): Unable to Assess   Alcohol / Substance  Use: Not Applicable Psych Involvement: No (comment)  Admission diagnosis:  Hypoxia [R09.02] Sepsis (HCC) [A41.9] Acute respiratory failure with hypoxia and hypercarbia (HCC) [J96.01, J96.02] Altered mental status, unspecified altered mental status type [R41.82] Patient Active Problem List   Diagnosis Date Noted   Sepsis (HCC) 01/29/2024   Hypoxia 01/29/2024   PAF (paroxysmal atrial fibrillation) (HCC) 05/03/2023   Moderate mitral stenosis 05/03/2023   Acute diastolic CHF (congestive heart failure) (HCC) 04/26/2023   Acute respiratory failure with hypoxia (HCC) 04/26/2023   Status post amputation of right foot (HCC) 04/26/2023   COPD (chronic obstructive pulmonary disease) (HCC) 04/26/2023   Normocytic anemia 04/26/2023   Hyponatremia 04/26/2023   Elevated INR 04/26/2023   Subacute osteomyelitis of right foot (HCC) 03/30/2023   Diabetic ulcer of right midfoot associated with type 2 diabetes mellitus, with muscle involvement without evidence of necrosis (HCC) 03/29/2023   Gangrene of right foot (HCC) 03/29/2023   Right foot infection 03/29/2023   Encounter for general adult medical examination with abnormal findings 03/19/2022   Insulin-requiring or dependent type II diabetes mellitus (HCC) 03/16/2022   Atrial flutter (HCC) 08/02/2021   Nonrheumatic mitral valve stenosis 01/27/2021   Atherosclerosis of aorta (HCC) 09/30/2020   Abnormal electrocardiogram (ECG) (EKG) 09/30/2020   Thiamine deficiency 07/04/2020   Ulcer of right foot with necrosis of bone (HCC)  Malignant neoplasm of urinary bladder (HCC) 07/11/2018   Chronic idiopathic constipation 06/12/2018   Complex sleep apnea syndrome 01/12/2016   OSA (obstructive sleep apnea) 12/18/2015   Benign prostatic hyperplasia 02/18/2014   PAD (peripheral artery disease) (HCC) 02/18/2014   DEGENERATIVE JOINT DISEASE 02/20/2008   Diabetes mellitus type 2 with atherosclerosis of arteries of extremities (HCC) 01/26/2008   Hyperlipidemia  01/26/2008   Mitral valve disorder 01/26/2008   GERD 01/26/2008   Irritable bowel syndrome 01/26/2008   PCP:  [name], MD Pharmacy:   [facility] [phone] - [place], [place] - [address] AT [address] [place]  [address] Phone: [phone] Fax: [phone]     Social Drivers of Health (SDOH) Social History: SDOH Screenings   Food Insecurity: No Food Insecurity (04/26/2023)  Housing: Medium Risk (04/26/2023)  Transportation Needs: No Transportation Needs (04/26/2023)  Utilities: Not At Risk (04/26/2023)  Alcohol Screen: Low Risk  (06/01/2022)  Depression (PHQ2-9): Low Risk  (06/01/2022)  Financial Resource Strain: Low Risk  (06/01/2022)  Physical Activity: Sufficiently Active (06/01/2022)  Social Connections: Socially Integrated (06/01/2022)  Stress: Stress Concern Present (06/01/2022)  Tobacco Use: Medium Risk (01/29/2024)   SDOH Interventions:     Readmission Risk Interventions    04/30/2023   12:25 PM  Readmission Risk Prevention Plan  Transportation Screening Complete  PCP or Specialist Appt within 3-5 Days Complete  HRI or Home Care Consult Complete  Social Work Consult for Recovery Care Planning/Counseling Complete  Palliative Care Screening Complete  Medication Review Oceanographer) Complete

## 2024-01-30 NOTE — Progress Notes (Addendum)
 PHARMACY - ANTICOAGULATION CONSULT NOTE  Pharmacy Consult for IV Heparin Indication: atrial fibrillation  Allergies  Allergen Reactions   Bactrim [Sulfamethoxazole-Trimethoprim] Rash    Patient Measurements: Height: 6\' 5"  (195.6 cm) Weight: 124.4 kg (274 lb 4 oz) IBW/kg (Calculated) : 89.1 Heparin Dosing Weight: 114.2 kg  Vital Signs: Temp: 99.5 F (37.5 C) (03/17 1600) Temp Source: Bladder (03/17 1600) BP: 118/62 (03/17 1600) Pulse Rate: 75 (03/17 1600)  Labs: Recent Labs    01/29/24 1500 01/29/24 1515 01/29/24 1550 01/29/24 2117 01/30/24 0350 01/30/24 0520  HGB 10.6* 12.9*  12.9* 10.9* 9.6* 9.9*  --   HCT 37.2* 38.0*  38.0* 32.0* 31.5* 31.2*  --   PLT 307  --   --  241 285  --   APTT  --   --   --  36  --   --   LABPROT  --   --   --  20.9*  --   --   INR  --   --   --  1.8*  --   --   CREATININE 1.48* 1.40*  --   --  1.22  --   TROPONINIHS 40*  --   --  107*  --  98*    Estimated Creatinine Clearance: 69.3 mL/min (by C-G formula based on SCr of 1.22 mg/dL).   Medical History: Past Medical History:  Diagnosis Date   Anxiety    Asthma    in past, no inhaler now   Benign neoplasm of colon    Bone infection (HCC)    Cancer (HCC)    bladder cancer   COPD (chronic obstructive pulmonary disease) (HCC)    Depression    Diverticulosis of colon (without mention of hemorrhage)    DJD (degenerative joint disease)    Esophageal reflux    Glaucoma    Hidradenitis    History of BPH    History of gynecomastia    Unilateral   Hyperlipidemia    Hypoxia 01/29/2024   Irritable bowel syndrome    Memory loss    Mitral stenosis    mild-moderate MS 01/2016   MVP (mitral valve prolapse)    Obesity, unspecified    Pneumonia    Sleep apnea    does not use  cpap or bipap .. no study  ever   Type II or unspecified type diabetes mellitus without mention of complication, not stated as uncontrolled    Unspecified venous (peripheral) insufficiency     Medications:   Medications Prior to Admission  Medication Sig Dispense Refill Last Dose/Taking   acetaminophen (TYLENOL) 325 MG tablet Take 2 tablets (650 mg total) by mouth every 6 (six) hours as needed for mild pain (or Fever >/= 101). 30 tablet 0 Taking As Needed   furosemide (LASIX) 40 MG tablet Take 1 tablet (40 mg total) by mouth 2 (two) times daily. (Patient taking differently: Take 40 mg by mouth daily.) 60 tablet 0 01/29/2024 Morning   insulin aspart (NOVOLOG) 100 UNIT/ML FlexPen Inject 16 Units into the skin 3 (three) times daily with meals. (Patient taking differently: Inject 8 Units into the skin at bedtime as needed for high blood sugar.) 15 mL 11 Taking Differently   metFORMIN (GLUCOPHAGE) 500 MG tablet Take 2 tablets (1,000 mg total) by mouth 2 (two) times daily with a meal. (Patient taking differently: Take 500 mg by mouth 2 (two) times daily with a meal.) 360 tablet 0 01/29/2024 Morning   metoprolol succinate (TOPROL-XL) 25 MG 24  hr tablet Take 0.5 tablets (12.5 mg total) by mouth daily. 15 tablet 0 01/29/2024 Morning   morphine (MSIR) 30 MG tablet Take 15 mg by mouth every 6 (six) hours as needed for severe pain (pain score 7-10).   01/29/2024 Morning   rivaroxaban (XARELTO) 20 MG TABS tablet Take 1 tablet (20 mg total) by mouth daily. 90 tablet 3 01/29/2024 at  8:30 AM   TOUJEO MAX SOLOSTAR 300 UNIT/ML Solostar Pen Inject 40 Units into the skin in the morning.   01/29/2024 Morning   traMADol (ULTRAM) 50 MG tablet Take 50 mg by mouth every 4 (four) hours as needed for moderate pain (pain score 4-6).   Taking As Needed   traZODone (DESYREL) 50 MG tablet Take 50 mg by mouth at bedtime.   01/28/2024 Bedtime   Vitamin D, Ergocalciferol, (DRISDOL) 1.25 MG (50000 UNIT) CAPS capsule Take 50,000 Units by mouth every Wednesday.   01/25/2024   B-D UF III MINI PEN NEEDLES 31G X 5 MM MISC       Continuous Glucose Sensor (FREESTYLE LIBRE 2 SENSOR) MISC 1 Act by Does not apply route daily. 2 each 5    Insulin Pen  Needle (B-D UF III MINI PEN NEEDLES) 31G X 5 MM MISC USE 1 PEN NEEDLE IN THE MORNING AND AT BEDTIME 100 each 2     Assessment: 83 years of age male admitted with acute respiratory distress and altered mental status. Patient is chronically on Xarelto for atrial flutter. Patient is NPO and Xarelto is on hold. Pharmacy consulted to start IV heparin therapy.  Last Xarelto dose taken 01/29/24 at 8:30 AM.  Hgb 9.9. Platelets 98. INR 1.8 in setting of recent Xarelto. Baseline aPTT 36. Due to recent Xarelto, will utilize aPTTs for now until correlating.  Chest tube placed 3/16 for right pleural effusion - serous drainage.   Goal of Therapy:  Heparin level 0.3-0.7 units/ml Monitor platelets by anticoagulation protocol: Yes   Plan:  No bolus due to recent Xarelto dose and recent chest tube placement.  Start Heparin at 1700 units/hr.  aPTT in 8 hours.  Daily aPTT, heparin level, and CBC.   Link Snuffer, PharmD, BCPS, BCCCP Clinical Pharmacist Please refer to [facility] for [facility] numbers 01/30/2024,4:32 PM

## 2024-01-30 NOTE — Inpatient Diabetes Management (Signed)
 Inpatient Diabetes Program Recommendations  AACE/ADA: New Consensus Statement on Inpatient Glycemic Control (2015)  Target Ranges:  Prepandial:   less than 140 mg/dL      Peak postprandial:   less than 180 mg/dL (1-2 hours)      Critically ill patients:  140 - 180 mg/dL   Lab Results  Component Value Date   GLUCAP 93 01/30/2024   HGBA1C 7.2 (H) 01/29/2024    Latest Reference Range & Units 01/29/24 18:07 01/29/24 19:56 01/29/24 23:37 01/30/24 03:45 01/30/24 07:55 01/30/24 08:27  Glucose-Capillary 70 - 99 mg/dL 782 (H) Novolog 8 units 243 (H) 182 (H) Novolog 3 units 117 (H) 42 (LL) 93  (LL): Data is critically low (H): Data is abnormally high  Diabetes history: DM2 Outpatient Diabetes medications: Novolog 8 units hs if CBGs elevated (?), Metformin 500 mg daily Current orders for Inpatient glycemic control: Novolog 0-15 units q 4 hrs.  Inpatient Diabetes Program Recommendations:   Noted patient had hypoglycemia post receiving Novolog correction. Please consider: -Decrease Novolog correction to 0-9 units q 4 hrs.  Thank you, Billy Fischer. Prithvi Kooi, RN, MSN, CDCES  Diabetes Coordinator Inpatient Glycemic Control Team Team Pager [phone] (8am-5pm) 01/30/2024 10:06 AM

## 2024-01-30 NOTE — Progress Notes (Signed)
 [facility] ADULT ICU REPLACEMENT PROTOCOL   The patient does apply for the [facility] Adult ICU Electrolyte Replacment Protocol based on the criteria listed below:   1.Exclusion criteria: TCTS, ECMO, Dialysis, and Myasthenia Gravis patients 2. Is GFR >/= 30 ml/min? Yes.    Patient's GFR today is 60 3. Is SCr </= 2? Yes.   Patient's SCr is 1.22 mg/dL 4. Did SCr increase >/= 0.5 in 24 hours? No. 5.Pt's weight >40kg  Yes.   6. Abnormal electrolyte(s): Phos 2.2, K+ 3.4  7. Electrolytes replaced per protocol 8.  Call MD STAT for K+ </= 2.5, Phos </= 1, or Mag </= 1 Physician:  Dr. Loralyn Freshwater, Lilia Argue 01/30/2024 4:47 AM

## 2024-01-30 NOTE — Progress Notes (Signed)
 Lower extremity venous duplex completed. Please see CV Procedures for preliminary results.  Shona Simpson, RVT 01/30/24 12:17 PM

## 2024-01-31 DIAGNOSIS — I4891 Unspecified atrial fibrillation: Secondary | ICD-10-CM | POA: Diagnosis not present

## 2024-01-31 DIAGNOSIS — I5023 Acute on chronic systolic (congestive) heart failure: Secondary | ICD-10-CM | POA: Diagnosis not present

## 2024-01-31 DIAGNOSIS — J9601 Acute respiratory failure with hypoxia: Secondary | ICD-10-CM | POA: Diagnosis not present

## 2024-01-31 DIAGNOSIS — E119 Type 2 diabetes mellitus without complications: Secondary | ICD-10-CM

## 2024-01-31 DIAGNOSIS — I10 Essential (primary) hypertension: Secondary | ICD-10-CM

## 2024-01-31 DIAGNOSIS — J90 Pleural effusion, not elsewhere classified: Secondary | ICD-10-CM | POA: Diagnosis not present

## 2024-01-31 LAB — CBC
HCT: 29.8 % — ABNORMAL LOW (ref 39.0–52.0)
HCT: 30.5 % — ABNORMAL LOW (ref 39.0–52.0)
Hemoglobin: 9.3 g/dL — ABNORMAL LOW (ref 13.0–17.0)
Hemoglobin: 9.5 g/dL — ABNORMAL LOW (ref 13.0–17.0)
MCH: 26 pg (ref 26.0–34.0)
MCH: 26.1 pg (ref 26.0–34.0)
MCHC: 31.1 g/dL (ref 30.0–36.0)
MCHC: 31.2 g/dL (ref 30.0–36.0)
MCV: 83.2 fL (ref 80.0–100.0)
MCV: 83.8 fL (ref 80.0–100.0)
Platelets: 264 10*3/uL (ref 150–400)
Platelets: 264 10*3/uL (ref 150–400)
RBC: 3.58 MIL/uL — ABNORMAL LOW (ref 4.22–5.81)
RBC: 3.64 MIL/uL — ABNORMAL LOW (ref 4.22–5.81)
RDW: 16.9 % — ABNORMAL HIGH (ref 11.5–15.5)
RDW: 17.1 % — ABNORMAL HIGH (ref 11.5–15.5)
WBC: 10.4 10*3/uL (ref 4.0–10.5)
WBC: 9.8 10*3/uL (ref 4.0–10.5)
nRBC: 0 % (ref 0.0–0.2)
nRBC: 0 % (ref 0.0–0.2)

## 2024-01-31 LAB — BASIC METABOLIC PANEL
Anion gap: 14 (ref 5–15)
BUN: 21 mg/dL (ref 8–23)
CO2: 29 mmol/L (ref 22–32)
Calcium: 8.4 mg/dL — ABNORMAL LOW (ref 8.9–10.3)
Chloride: 96 mmol/L — ABNORMAL LOW (ref 98–111)
Creatinine, Ser: 1.23 mg/dL (ref 0.61–1.24)
GFR, Estimated: 59 mL/min — ABNORMAL LOW (ref >60)
Glucose, Bld: 180 mg/dL — ABNORMAL HIGH (ref 70–99)
Potassium: 3.4 mmol/L — ABNORMAL LOW (ref 3.5–5.1)
Sodium: 139 mmol/L (ref 135–145)

## 2024-01-31 LAB — APTT
aPTT: 133 s — ABNORMAL HIGH (ref 24–36)
aPTT: 69 s — ABNORMAL HIGH (ref 24–36)
aPTT: 92 s — ABNORMAL HIGH (ref 24–36)

## 2024-01-31 LAB — GLUCOSE, CAPILLARY
Glucose-Capillary: 144 mg/dL — ABNORMAL HIGH (ref 70–99)
Glucose-Capillary: 171 mg/dL — ABNORMAL HIGH (ref 70–99)
Glucose-Capillary: 191 mg/dL — ABNORMAL HIGH (ref 70–99)
Glucose-Capillary: 192 mg/dL — ABNORMAL HIGH (ref 70–99)
Glucose-Capillary: 203 mg/dL — ABNORMAL HIGH (ref 70–99)
Glucose-Capillary: 220 mg/dL — ABNORMAL HIGH (ref 70–99)
Glucose-Capillary: 231 mg/dL — ABNORMAL HIGH (ref 70–99)

## 2024-01-31 LAB — PHOSPHORUS: Phosphorus: 3.5 mg/dL (ref 2.5–4.6)

## 2024-01-31 LAB — TRIGLYCERIDES, BODY FLUIDS: Triglycerides, Fluid: 22 mg/dL

## 2024-01-31 LAB — MAGNESIUM: Magnesium: 1.9 mg/dL (ref 1.7–2.4)

## 2024-01-31 LAB — HEPARIN LEVEL (UNFRACTIONATED): Heparin Unfractionated: 0.6 [IU]/mL (ref 0.30–0.70)

## 2024-01-31 LAB — CYTOLOGY - NON PAP

## 2024-01-31 MED ORDER — HEPARIN (PORCINE) 25000 UT/250ML-% IV SOLN: 1400.0000 [IU]/h | INTRAVENOUS | Status: AC

## 2024-01-31 MED ORDER — FUROSEMIDE 10 MG/ML IJ SOLN
80.0000 mg | Freq: Three times a day (TID) | INTRAMUSCULAR | Status: AC
  Administered 2024-01-31 (×2): 80 mg via INTRAVENOUS
  Filled 2024-01-31 (×2): qty 8

## 2024-01-31 MED ORDER — DOXYCYCLINE HYCLATE 100 MG PO TABS
100.0000 mg | ORAL_TABLET | Freq: Two times a day (BID) | ORAL | Status: DC
  Administered 2024-01-31 – 2024-02-01 (×3): 100 mg
  Filled 2024-01-31 (×3): qty 1

## 2024-01-31 MED ORDER — POTASSIUM CHLORIDE 20 MEQ PO PACK
40.0000 meq | PACK | Freq: Once | ORAL | Status: AC
  Administered 2024-01-31: 40 meq
  Filled 2024-01-31: qty 2

## 2024-01-31 MED ORDER — MAGNESIUM SULFATE 2 GM/50ML IV SOLN
2.0000 g | Freq: Once | INTRAVENOUS | Status: AC
  Administered 2024-01-31: 2 g via INTRAVENOUS
  Filled 2024-01-31: qty 50

## 2024-01-31 MED ORDER — RIVAROXABAN 10 MG PO TABS
20.0000 mg | ORAL_TABLET | Freq: Every day | ORAL | Status: DC
  Administered 2024-01-31 – 2024-02-09 (×10): 20 mg via ORAL
  Filled 2024-01-31: qty 2
  Filled 2024-01-31: qty 1
  Filled 2024-01-31 (×5): qty 2
  Filled 2024-01-31 (×2): qty 1
  Filled 2024-01-31 (×2): qty 2

## 2024-01-31 NOTE — Progress Notes (Addendum)
 NAME:  Jason [name], MRN:  [id number], DOB:  04/14/1941, LOS: 2 ADMISSION DATE:  01/29/2024, CONSULTATION DATE:  01/29/2024 REFERRING MD:  Dr. Clemon Chambers - EDP, CHIEF COMPLAINT: Acute respiratory failure  History of Present Illness:  Jason [name] is a 83 year old male with an extensive past medical history significant for but not limited to COPD, HTN, HLD, HFpEF, sleep apnea, diabetes who presented to the ED 3/16 with complaints of acute respiratory distress and altered mental status.  Family reports patient was somewhat confused prompting EMS call, on first responder presentation patient was found hypoxic with SpO2 in the 60s with witnessed loss of consciousness prompting short was compressions, on EMS arrival patient had strong central pulse.  Patient was transported emergently to the ED with bag mask ventilation assistance and intubated emergently on ED arrival.  On ED arrival patient was found hypothermic, tachypneic, mildly hypertensive.  Lab work significant for  NA 134, Kos 282, creatinine 1.46, albumin 3.3, GFR 47, BNP 1,360 hemoglobin 10.6 lactic 3.3.   Chest x-ray with small to moderate layering right pleural effusion PCCM consulted for further management assistance  Pertinent  Medical History  COPD, HTN, HLD, HFpEF, sleep apnea, diabetes  Significant Hospital Events: Including procedures, antibiotic start and stop dates in addition to other pertinent events   3/16 presented with acute altered mental status and hypoxic respiratory failure, intubated on ED arrival  Interim History / Subjective:  Awake, able to follow commands.  Failed SBT trial this morning, fentanyl was still running. Will attempt another trial with fentanyl off.  Objective   Blood pressure 103/65, pulse 75, temperature 99.7 F (37.6 C), resp. rate 20, height 6\' 5"  (1.956 m), weight 132.2 kg, SpO2 100%. CVP:  [13 mmHg] 13 mmHg  Vent Mode: PRVC FiO2 (%):  [40 %-60 %] 40 % Set Rate:  [20 bmp-22 bmp] 20 bmp Vt  Set:  [660 mL] 660 mL PEEP:  [5 cmH20-10 cmH20] 8 cmH20 Pressure Support:  [8 cmH20] 8 cmH20 Plateau Pressure:  [20 cmH20-24 cmH20] 20 cmH20   *Propofol 20 mcg ** Fentanyl 25    Intake/Output Summary (Last 24 hours) at 01/31/2024 0746 Last data filed at 01/31/2024 0700 Gross per 24 hour  Intake 3115.09 ml  Output 3130 ml  Net -14.91 ml   Output: 2.4 L of urine Chest tube output 0.65 L   Filed Weights   01/29/24 1528 01/30/24 0245 01/31/24 0500  Weight: 120.8 kg 124.4 kg 132.2 kg    Examination: General: Acute on chronic ill-appearing deconditioned elderly male lying in bed on mechanical ventilation in no acute distress HEENT: ETT, MM pink/moist, PERRL,  Neuro:  CV: s1s2 regular rate and rhythm, no murmur, rubs, or gallops,  PULM: Bilateral rhonchi, right greater than left, no increased work of breathing, tolerating ventilator GI: soft, bowel sounds active in all 4 quadrants, non-tender, non-distended Extremities: warm/dry, no edema  Skin: no rashes or lesions  Labs Potassium 3.4, SCr 1.23 Blood cultures NGTD at 48 hours  Tracheal aspirate NGTD. Resolved Hospital Problem list     Assessment & Plan:  Acute hypoxic and hypercapnic respiratory failure Right pleural effusion Chest tube output decreasing.  Pleural fluid culture NGTD.  Will continue diuresis with IV Lasix, and chest tube. P: -Trial of SBT -IV diuresis - Will discontinue chest tube after successful extubation likely tomorrow. - Follow-up with social work to identify POA and next of kin - wife at home has dementia.   Concern for evolving septic shock MAPS >65 on  norepinephrine.  Blood cultures negative at 48 hours.  On doxycycline for left lower extremity wounds. P: -Continue doxycycline. -Continue vent as above.  Acute on chronic heart failure. Mitral valve stenosis Atrial flutter anticoagulated with Xarelto at baseline Currently on heparin for anticoagulation, will switch to Xarelto as patient is  currently on tube feeds.  - Tube feeds - Continue diuresis as above. - Xarelto for anticoagulation.  Type 2 diabetes Fasting glucose 180, goal < 120.  - SSI - CBG goal 140-180 - CBG checks every 4  Best Practice (right click and "Reselect all SmartList Selections" daily)   Diet/type: Tube feeds DVT prophylaxis DOAC Pressure ulcer(s): N/A GI prophylaxis: PPI Lines: N/A Foley:  Yes, and it is still needed Code Status:  full code Last date of multidisciplinary goals of care discussion:Pending   Labs   CBC: Recent Labs  Lab 01/29/24 1500 01/29/24 1515 01/29/24 1550 01/29/24 2117 01/30/24 0350 01/31/24 0000 01/31/24 0408  WBC 7.9  --   --  8.6 7.5 10.4 9.8  NEUTROABS  --   --   --  6.9  --   --   --   HGB 10.6*   < > 10.9* 9.6* 9.9* 9.5* 9.3*  HCT 37.2*   < > 32.0* 31.5* 31.2* 30.5* 29.8*  MCV 92.3  --   --  86.1 84.6 83.8 83.2  PLT 307  --   --  241 285 264 264   < > = values in this interval not displayed.    Basic Metabolic Panel: Recent Labs  Lab 01/29/24 1500 01/29/24 1515 01/29/24 1550 01/30/24 0350 01/31/24 0408  NA 133* 134*  134* 133* 137 139  K 4.9 5.0  5.0 4.7 3.4* 3.4*  CL 92* 95*  --  96* 96*  CO2 27  --   --  30 29  GLUCOSE 282* 276*  --  97 180*  BUN 25* 24*  --  20 21  CREATININE 1.48* 1.40*  --  1.22 1.23  CALCIUM 9.0  --   --  9.1 8.4*  MG  --   --   --  2.0 1.9  PHOS  --   --   --  2.2* 3.5   GFR: Estimated Creatinine Clearance: 70.8 mL/min (by C-G formula based on SCr of 1.23 mg/dL). Recent Labs  Lab 01/29/24 1500 01/29/24 2117 01/30/24 0350 01/31/24 0000 01/31/24 0408  PROCALCITON  --  <0.10  --   --   --   WBC 7.9 8.6 7.5 10.4 9.8  LATICACIDVEN 3.8* 2.4*  --   --   --     Liver Function Tests: Recent Labs  Lab 01/29/24 1500  AST 49*  ALT 39  ALKPHOS 48  BILITOT 1.0  PROT 7.3  ALBUMIN 3.3*   No results for input(s): "LIPASE", "AMYLASE" in the last 168 hours. No results for input(s): "AMMONIA" in the last 168  hours.  ABG    Component Value Date/Time   PHART 7.443 01/29/2024 1550   PCO2ART 38.9 01/29/2024 1550   PO2ART 196 (H) 01/29/2024 1550   HCO3 27.1 01/29/2024 1550   TCO2 28 01/29/2024 1550   O2SAT 100 01/29/2024 1550     Coagulation Profile: Recent Labs  Lab 01/29/24 2117  INR 1.8*    Cardiac Enzymes: No results for input(s): "CKTOTAL", "CKMB", "CKMBINDEX", "TROPONINI" in the last 168 hours.  HbA1C: HbA1c, POC (prediabetic range)  Date/Time Value Ref Range Status  08/18/2021 02:40 PM 7.5 (A) 5.7 - 6.4 %  Final   HbA1c, POC (controlled diabetic range)  Date/Time Value Ref Range Status  08/18/2021 02:40 PM 7.5 (A) 0.0 - 7.0 % Final   HbA1c POC (<> result, manual entry)  Date/Time Value Ref Range Status  08/18/2021 02:40 PM 7.5 4.0 - 5.6 % Final   Hgb A1c MFr Bld  Date/Time Value Ref Range Status  01/29/2024 09:17 PM 7.2 (H) 4.8 - 5.6 % Final    Comment:    (NOTE) Pre diabetes:          5.7%-6.4%  Diabetes:              >6.4%  Glycemic control for   <7.0% adults with diabetes   04/26/2023 09:25 PM 5.8 (H) 4.8 - 5.6 % Final    Comment:    (NOTE) Pre diabetes:          5.7%-6.4%  Diabetes:              >6.4%  Glycemic control for   <7.0% adults with diabetes     CBG: Recent Labs  Lab 01/30/24 1559 01/30/24 1949 01/30/24 2342 01/31/24 0401 01/31/24 0555  GLUCAP 95 95 149* 171* 144*    Review of Systems:   Unable to assess   Past Medical History:  He,  has a past medical history of Anxiety, Asthma, Benign neoplasm of colon, Bone infection (HCC), Cancer (HCC), COPD (chronic obstructive pulmonary disease) (HCC), Depression, Diverticulosis of colon (without mention of hemorrhage), DJD (degenerative joint disease), Esophageal reflux, Glaucoma, Hidradenitis, History of BPH, History of gynecomastia, Hyperlipidemia, Hypoxia (01/29/2024), Irritable bowel syndrome, Memory loss, Mitral stenosis, MVP (mitral valve prolapse), Obesity, unspecified, Pneumonia,  Sleep apnea, Type II or unspecified type diabetes mellitus without mention of complication, not stated as uncontrolled, and Unspecified venous (peripheral) insufficiency.   Surgical History:   Past Surgical History:  Procedure Laterality Date   ACHILLES TENDON SURGERY Right 05/17/2019   Procedure: ACHILLES LENGTHENING/KIDNER;  Surgeon: Park Liter, DPM;  Location: [facility] OR;  Service: Podiatry;  Laterality: Right;   AMPUTATION  08/09/2012   Procedure: AMPUTATION DIGIT;  Surgeon: Nadara Mustard, MD;  Location: [facility] OR;  Service: Orthopedics;  Laterality: Right;  Amputation right Great Toe MTP Joint   AMPUTATION  10/17/2012   Procedure: AMPUTATION DIGIT;  Surgeon: Nadara Mustard, MD;  Location: [facility] OR;  Service: Orthopedics;  Laterality: Right;  Right Foot 3rd Toe Amputation at Metatarsophalangeal Joint   AMPUTATION Right 03/30/2023   Procedure: TRANSMETATARSAL AMPUTATION, RIGHT FOOT;  Surgeon: Pilar Plate, DPM;  Location: [facility] ORS;  Service: Podiatry;  Laterality: Right;   AMPUTATION TOE Right 05/17/2019   Procedure: AMPUTATION TOE Metatarsal Phalangeal Joint   FOURTH AND FIFTH, FILLETED TOE FLAP;  Surgeon: Park Liter, DPM;  Location: [facility] OR;  Service: Podiatry;  Laterality: Right;   BUBBLE STUDY  08/12/2021   Procedure: BUBBLE STUDY;  Surgeon: Sande Rives, MD;  Location[facility] ENDOSCOPY;  Service: Cardiovascular;;   CHOLECYSTECTOMY N/A 06/14/2018   Procedure: LAPAROSCOPIC CHOLECYSTECTOMY WITH INTRAOPERATIVE CHOLANGIOGRAM;  Surgeon: Rodman Pickle, MD;  Location: [facility];  Service: General;  Laterality: N/A;   CIRCUMCISION  06/2100   For Phimosis - Dr Marcello Fennel   COLON SURGERY     CYSTOSCOPY W/ RETROGRADES Bilateral 07/07/2018   Procedure: CYSTOSCOPY WITH BILATERAL RETROGRADE PYELOGRAM;  Surgeon: Crist Fat, MD;  Location: [facility];  Service: Urology;  Laterality: Bilateral;   KNEE ARTHROSCOPY     LAPAROSCOPIC LYSIS OF ADHESIONS  06/14/2018   Procedure:  LAPAROSCOPIC LYSIS  OF ADHESIONS;  Surgeon: Kinsinger, De Blanch, MD;  Location: [facility];  Service: General;;   LAPAROTOMY  07/20/11   LASER ABLATION  06/2009   Left Greater Saphenous vein -Dr Jenean Lindau OSTEOTOMY Right 05/17/2019   Procedure: METATARSLSECTOMY FOURTH DIGIT;  Surgeon: Park Liter, DPM;  Location: [facility] OR;  Service: Podiatry;  Laterality: Right;   MULTIPLE TOOTH EXTRACTIONS     TEE WITHOUT CARDIOVERSION N/A 08/12/2021   Procedure: TRANSESOPHAGEAL ECHOCARDIOGRAM (TEE);  Surgeon: Sande Rives, MD;  Location: [facility] ENDOSCOPY;  Service: Cardiovascular;  Laterality: N/A;   TOE AMPUTATION  06/2009   x3Right foot second toe -Dr Lestine Box   TONSILLECTOMY     TRANSURETHRAL RESECTION OF BLADDER TUMOR N/A 07/07/2018   Procedure: TRANSURETHRAL RESECTION OF BLADDER TUMOR (TURBT) WITH POST OP INSTILLATION OF GEMCITABIN;  Surgeon: Crist Fat, MD;  Location: [facility];  Service: Urology;  Laterality: N/A;   WOUND DEBRIDEMENT Right 07/25/2019   Procedure: Right foot wound debridement with application of skin graft substitute;  Surgeon: Park Liter, DPM;  Location: [facility];  Service: Podiatry;  Laterality: Right;     Social History:   reports that he quit smoking about 8 years ago. His smoking use included cigarettes. He started smoking about 13 years ago. He has a 0.5 pack-year smoking history. He has never used smokeless tobacco. He reports that he does not currently use alcohol after a past usage of about 7.0 standard drinks of alcohol per week. He reports that he does not use drugs.   Family History:  His family history includes Cancer in his father; Diabetes in his father; High blood pressure in an other family member; Prostate cancer in his father; Stroke in his father. There is no history of Colon cancer, Esophageal cancer, Rectal cancer, or Stomach cancer.   Allergies Allergies  Allergen Reactions   Bactrim [Sulfamethoxazole-Trimethoprim] Rash     Home Medications  Prior to Admission  medications   Medication Sig Start Date End Date Taking? Authorizing Provider  acetaminophen (TYLENOL) 325 MG tablet Take 2 tablets (650 mg total) by mouth every 6 (six) hours as needed for mild pain (or Fever >/= 101). 04/02/23   Marinda Elk, MD  B-D UF III MINI PEN NEEDLES 31G X 5 MM MISC  07/10/21   [provider]  Bempedoic Acid-Ezetimibe (NEXLIZET) 180-10 MG TABS Take 1 tablet by mouth daily. 04/21/23   [name], MD  cadexomer iodine (IODOSORB) 0.9 % gel Apply 1 Application topically daily as needed for wound care. 09/29/23   Standiford, Jenelle Mages, DPM  Continuous Glucose Sensor (FREESTYLE LIBRE 2 SENSOR) MISC 1 Act by Does not apply route daily. Patient taking differently: Inject 1 Device into the skin every 14 (fourteen) days. 04/13/23   [name], MD  cyanocobalamin (VITAMIN B12) 500 MCG tablet Take 500 mcg by mouth daily.    [provider]  docusate sodium (COLACE) 100 MG capsule Take 1 capsule (100 mg total) by mouth 2 (two) times daily. 05/10/23   [place], Alvira Philips, DO  furosemide (LASIX) 40 MG tablet Take 1 tablet (40 mg total) by mouth 2 (two) times daily. 05/10/23 08/25/23  [place], Alvira Philips, DO  insulin aspart (NOVOLOG) 100 UNIT/ML FlexPen Inject 16 Units into the skin 3 (three) times daily with meals. 05/10/23   [place], Eric J, DO  Insulin Pen Needle (B-D UF III MINI PEN NEEDLES) 31G X 5 MM MISC USE 1 PEN NEEDLE IN THE  MORNING AND AT BEDTIME 04/05/23   [name], MD  metFORMIN (GLUCOPHAGE) 500 MG tablet Take 2 tablets (1,000 mg total) by mouth 2 (two) times daily with a meal. 04/02/23   Marinda Elk, MD  metoprolol succinate (TOPROL-XL) 25 MG 24 hr tablet Take 0.5 tablets (12.5 mg total) by mouth daily. 05/10/23 08/25/23  [place], Alvira Philips, DO  polyethylene glycol (MIRALAX / GLYCOLAX) 17 g packet Take 17 g by mouth daily as needed for mild constipation. 05/10/23   [place], Alvira Philips, DO  rivaroxaban (XARELTO) 20 MG TABS tablet Take 1 tablet (20 mg  total) by mouth daily. 10/18/22   [name], MD  rosuvastatin (CRESTOR) 40 MG tablet Take 1 tablet (40 mg total) by mouth daily. 04/08/23 08/25/23  [name], MD  thiamine (VITAMIN B-1) 100 MG tablet Take 100 mg by mouth daily.    [provider]  Vitamin D, Ergocalciferol, (DRISDOL) 1.25 MG (50000 UNIT) CAPS capsule Take 50,000 Units by mouth every Wednesday.    [provider]    Laretta Bolster, M.D.  Internal Medicine Resident, PGY-1 [facility]  Pager: [phone] 7:46 AM, 01/31/2024   **Please contact the on call pager after 5 pm and on weekends at [phone].**

## 2024-01-31 NOTE — Plan of Care (Signed)

## 2024-01-31 NOTE — Progress Notes (Signed)
 RT NOTE: attempted SBT on CPAP/PSV of 12/8 at 0735 however patient's RR decreased to 8 and minute ventilation decreased to 3.0.  Placed back on full support ventilation and tolerating well at this time.  Will continue to monitor.

## 2024-01-31 NOTE — Progress Notes (Signed)
 [facility] ADULT ICU REPLACEMENT PROTOCOL   The patient does apply for the [facility] Adult ICU Electrolyte Replacment Protocol based on the criteria listed below:   1.Exclusion criteria: TCTS, ECMO, Dialysis, and Myasthenia Gravis patients 2. Is GFR >/= 30 ml/min? Yes.    Patient's GFR today is 59 3. Is SCr </= 2? Yes.   Patient's SCr is 1.23 mg/dL 4. Did SCr increase >/= 0.5 in 24 hours? No. 5.Pt's weight >40kg  Yes.   6. Abnormal electrolyte(s): potassium 3.4, mag 1.9  7. Electrolytes replaced per protocol 8.  Call MD STAT for K+ </= 2.5, Phos </= 1, or Mag </= 1 Physician:  protocol  Melvern Banker 01/31/2024 6:10 AM

## 2024-01-31 NOTE — Progress Notes (Signed)
 PHARMACY - ANTICOAGULATION CONSULT NOTE  Pharmacy Consult for IV Heparin Indication: atrial fibrillation  Allergies  Allergen Reactions   Bactrim [Sulfamethoxazole-Trimethoprim] Rash    Patient Measurements: Height: 6\' 5"  (195.6 cm) Weight: 124.4 kg (274 lb 4 oz) IBW/kg (Calculated) : 89.1 Heparin Dosing Weight: 114.2 kg  Vital Signs: Temp: 99.7 F (37.6 C) (03/17 2302) Temp Source: Bladder (03/17 1600) BP: 115/59 (03/17 2302) Pulse Rate: 69 (03/17 2302)  Labs: Recent Labs    01/29/24 1500 01/29/24 1515 01/29/24 1550 01/29/24 2117 01/30/24 0350 01/30/24 0520 01/31/24 0000  HGB 10.6* 12.9*  12.9*   < > 9.6* 9.9*  --  9.5*  HCT 37.2* 38.0*  38.0*   < > 31.5* 31.2*  --  30.5*  PLT 307  --   --  241 285  --  264  APTT  --   --   --  36  --   --  69*  LABPROT  --   --   --  20.9*  --   --   --   INR  --   --   --  1.8*  --   --   --   CREATININE 1.48* 1.40*  --   --  1.22  --   --   TROPONINIHS 40*  --   --  107*  --  98*  --    < > = values in this interval not displayed.    Estimated Creatinine Clearance: 69.3 mL/min (by C-G formula based on SCr of 1.22 mg/dL).   Assessment: 83 years of age male admitted with acute respiratory distress and altered mental status. Patient is chronically on Xarelto for atrial flutter. Patient is NPO and Xarelto is on hold. Pharmacy consulted to start IV heparin therapy.  Last Xarelto dose taken 01/29/24 at 8:30 AM.  Hgb 9.9. Platelets 98. INR 1.8 in setting of recent Xarelto. Baseline aPTT 36. Due to recent Xarelto, will utilize aPTTs for now until correlating.  Chest tube placed 3/16 for right pleural effusion - serous drainage.   AM aPTT therapeutic on 1700 units/hr. Per RN, no signs/symptoms of bleeding, chest tube drainage unchanged.  Goal of Therapy:  Heparin level 0.3-0.7 units/ml Monitor platelets by anticoagulation protocol: Yes   Plan:  Continue heparin at 1700 units/hr.  Confirmatory aPTT in 8 hours.  Daily aPTT and  heparin level until levels correlate and then transition to heparin levels  Daily CBC  Arabella Merles, PharmD. Clinical Pharmacist 01/31/2024 12:37 AM

## 2024-01-31 NOTE — Progress Notes (Signed)
 PHARMACY - ANTICOAGULATION CONSULT NOTE  Pharmacy Consult for IV Heparin Indication: atrial fibrillation  Allergies  Allergen Reactions   Bactrim [Sulfamethoxazole-Trimethoprim] Rash    Patient Measurements: Height: 6\' 5"  (195.6 cm) Weight: 132.2 kg (291 lb 7.2 oz) IBW/kg (Calculated) : 89.1 Heparin Dosing Weight: 114.2 kg  Vital Signs: Temp: 99.5 F (37.5 C) (03/18 0900) Temp Source: Bladder (03/18 0800) BP: 97/55 (03/18 0900) Pulse Rate: 72 (03/18 0900)  Labs: Recent Labs    01/29/24 1500 01/29/24 1515 01/29/24 1550 01/29/24 2117 01/30/24 0350 01/30/24 0520 01/31/24 0000 01/31/24 0408 01/31/24 0754  HGB 10.6* 12.9*  12.9*   < > 9.6* 9.9*  --  9.5* 9.3*  --   HCT 37.2* 38.0*  38.0*   < > 31.5* 31.2*  --  30.5* 29.8*  --   PLT 307  --   --  241 285  --  264 264  --   APTT  --   --    < > 36  --   --  69* 92* 133*  LABPROT  --   --   --  20.9*  --   --   --   --   --   INR  --   --   --  1.8*  --   --   --   --   --   HEPARINUNFRC  --   --   --   --   --   --   --   --  0.60  CREATININE 1.48* 1.40*  --   --  1.22  --   --  1.23  --   TROPONINIHS 40*  --   --  107*  --  98*  --   --   --    < > = values in this interval not displayed.    Estimated Creatinine Clearance: 70.8 mL/min (by C-G formula based on SCr of 1.23 mg/dL).   Assessment: 83 years of age male admitted with acute respiratory distress and altered mental status. Patient is chronically on Xarelto for atrial flutter. Patient is NPO and Xarelto is on hold. Pharmacy consulted to start IV heparin therapy. Last Xarelto dose taken 01/29/24 at 8:30 AM.   AM aPTT supratherapeutic on 1700 units/hr. Per RN, no signs/symptoms of bleeding, chest tube drainage overall improved.  Goal of Therapy:  Heparin level 0.3-0.7 units/ml Monitor platelets by anticoagulation protocol: Yes   Plan:  Hold heparin infusion x1 hour Decrease heparin to 1400 units/hr Initiate Xarelto 20 mg at 1700 and discontinue heparin at  that time Daily CBC, monitor renal function  Rutherford Nail, PharmD PGY2 Critical Care Pharmacy Resident 01/31/2024 10:42 AM

## 2024-02-01 ENCOUNTER — Inpatient Hospital Stay (HOSPITAL_COMMUNITY)

## 2024-02-01 DIAGNOSIS — I4891 Unspecified atrial fibrillation: Secondary | ICD-10-CM | POA: Diagnosis not present

## 2024-02-01 DIAGNOSIS — J90 Pleural effusion, not elsewhere classified: Secondary | ICD-10-CM | POA: Diagnosis not present

## 2024-02-01 DIAGNOSIS — I5023 Acute on chronic systolic (congestive) heart failure: Secondary | ICD-10-CM | POA: Diagnosis not present

## 2024-02-01 DIAGNOSIS — J9601 Acute respiratory failure with hypoxia: Secondary | ICD-10-CM | POA: Diagnosis not present

## 2024-02-01 LAB — CBC
HCT: 28.9 % — ABNORMAL LOW (ref 39.0–52.0)
Hemoglobin: 9 g/dL — ABNORMAL LOW (ref 13.0–17.0)
MCH: 26.2 pg (ref 26.0–34.0)
MCHC: 31.1 g/dL (ref 30.0–36.0)
MCV: 84.3 fL (ref 80.0–100.0)
Platelets: 243 10*3/uL (ref 150–400)
RBC: 3.43 MIL/uL — ABNORMAL LOW (ref 4.22–5.81)
RDW: 17.4 % — ABNORMAL HIGH (ref 11.5–15.5)
WBC: 8.9 10*3/uL (ref 4.0–10.5)
nRBC: 0 % (ref 0.0–0.2)

## 2024-02-01 LAB — CULTURE, RESPIRATORY W GRAM STAIN

## 2024-02-01 LAB — BASIC METABOLIC PANEL
Anion gap: 11 (ref 5–15)
BUN: 18 mg/dL (ref 8–23)
CO2: 29 mmol/L (ref 22–32)
Calcium: 8 mg/dL — ABNORMAL LOW (ref 8.9–10.3)
Chloride: 96 mmol/L — ABNORMAL LOW (ref 98–111)
Creatinine, Ser: 1.15 mg/dL (ref 0.61–1.24)
GFR, Estimated: >60 mL/min (ref >60)
Glucose, Bld: 227 mg/dL — ABNORMAL HIGH (ref 70–99)
Potassium: 3.2 mmol/L — ABNORMAL LOW (ref 3.5–5.1)
Sodium: 136 mmol/L (ref 135–145)

## 2024-02-01 LAB — GLUCOSE, CAPILLARY
Glucose-Capillary: 151 mg/dL — ABNORMAL HIGH (ref 70–99)
Glucose-Capillary: 157 mg/dL — ABNORMAL HIGH (ref 70–99)
Glucose-Capillary: 201 mg/dL — ABNORMAL HIGH (ref 70–99)
Glucose-Capillary: 225 mg/dL — ABNORMAL HIGH (ref 70–99)
Glucose-Capillary: 233 mg/dL — ABNORMAL HIGH (ref 70–99)
Glucose-Capillary: 247 mg/dL — ABNORMAL HIGH (ref 70–99)

## 2024-02-01 LAB — MAGNESIUM: Magnesium: 2.1 mg/dL (ref 1.7–2.4)

## 2024-02-01 LAB — HEPARIN LEVEL (UNFRACTIONATED): Heparin Unfractionated: >1.1 [IU]/mL — ABNORMAL HIGH (ref 0.30–0.70)

## 2024-02-01 LAB — PHOSPHORUS: Phosphorus: 2.6 mg/dL (ref 2.5–4.6)

## 2024-02-01 LAB — APTT: aPTT: 41 s — ABNORMAL HIGH (ref 24–36)

## 2024-02-01 MED ORDER — DOCUSATE SODIUM 100 MG PO CAPS
100.0000 mg | ORAL_CAPSULE | Freq: Two times a day (BID) | ORAL | Status: DC
Start: 2024-02-01 — End: 2024-02-02
  Administered 2024-02-02: 100 mg via ORAL
  Filled 2024-02-01: qty 1

## 2024-02-01 MED ORDER — POLYETHYLENE GLYCOL 3350 17 G PO PACK: 17.0000 g | PACK | Freq: Every day | ORAL | Status: DC | PRN

## 2024-02-01 MED ORDER — FAMOTIDINE 20 MG PO TABS: 20.0000 mg | ORAL_TABLET | Freq: Two times a day (BID) | ORAL | Status: DC

## 2024-02-01 MED ORDER — DOCUSATE SODIUM 100 MG PO CAPS: 100.0000 mg | ORAL_CAPSULE | Freq: Two times a day (BID) | ORAL | Status: DC | PRN

## 2024-02-01 MED ORDER — DOXYCYCLINE HYCLATE 100 MG PO TABS
100.0000 mg | ORAL_TABLET | Freq: Two times a day (BID) | ORAL | Status: AC
  Administered 2024-02-02 – 2024-02-05 (×8): 100 mg via ORAL
  Filled 2024-02-01 (×8): qty 1

## 2024-02-01 MED ORDER — POLYETHYLENE GLYCOL 3350 17 G PO PACK
17.0000 g | PACK | Freq: Every day | ORAL | Status: DC
  Administered 2024-02-02: 17 g via ORAL
  Filled 2024-02-01: qty 1

## 2024-02-01 MED ORDER — FUROSEMIDE 10 MG/ML IJ SOLN
80.0000 mg | Freq: Three times a day (TID) | INTRAMUSCULAR | Status: AC
  Administered 2024-02-01 (×2): 80 mg via INTRAVENOUS
  Filled 2024-02-01 (×2): qty 8

## 2024-02-01 MED ORDER — POTASSIUM CHLORIDE 20 MEQ PO PACK
40.0000 meq | PACK | ORAL | Status: AC
  Administered 2024-02-01 (×2): 40 meq
  Filled 2024-02-01 (×2): qty 2

## 2024-02-01 MED ORDER — THIAMINE MONONITRATE 100 MG PO TABS
100.0000 mg | ORAL_TABLET | Freq: Every day | ORAL | Status: AC
  Administered 2024-02-02 – 2024-02-03 (×2): 100 mg via ORAL
  Filled 2024-02-01 (×2): qty 1

## 2024-02-01 NOTE — Progress Notes (Signed)
 [facility] ADULT ICU REPLACEMENT PROTOCOL   The patient does apply for the [facility] Adult ICU Electrolyte Replacment Protocol based on the criteria listed below:   1.Exclusion criteria: TCTS, ECMO, Dialysis, and Myasthenia Gravis patients 2. Is GFR >/= 30 ml/min? Yes.    Patient's GFR today is >60 3. Is SCr </= 2? Yes.   Patient's SCr is 1.15 mg/dL 4. Did SCr increase >/= 0.5 in 24 hours? No. 5.Pt's weight >40kg  Yes.   6. Abnormal electrolyte(s): K+ = 3.2  7. Electrolytes replaced per protocol 8.  Call MD STAT for K+ </= 2.5, Phos </= 1, or Mag </= 1 Physician:  Delia Chimes, eMD   Jason [name] 02/01/2024 4:11 AM

## 2024-02-01 NOTE — Procedures (Signed)
 Extubation Procedure Note  Patient Details:   Name: Jason [name] DOB: 09-04-1941 MRN: [id number]   Airway Documentation:    Vent end date: 02/01/24 Vent end time: 1120   Evaluation  O2 sats: stable throughout Complications: No apparent complications Patient did tolerate procedure well. Bilateral Breath Sounds: Clear, Diminished   Yes  Cuff leak confirmed. No post extubation stridor noted  Jennylee Uehara 02/01/2024, 11:29 AM

## 2024-02-01 NOTE — Progress Notes (Signed)
 NAME:  Jason [name], MRN:  [id number], DOB:  05-25-1941, LOS: 3 ADMISSION DATE:  01/29/2024, CONSULTATION DATE:  01/29/2024 REFERRING MD:  Dr. Clemon Chambers - EDP, CHIEF COMPLAINT: Acute respiratory failure  History of Present Illness:  Jason [name] is a 83 year old male with an extensive past medical history significant for but not limited to COPD, HTN, HLD, HFpEF, sleep apnea, diabetes who presented to the ED 3/16 with complaints of acute respiratory distress and altered mental status.  Family reports patient was somewhat confused prompting EMS call, on first responder presentation patient was found hypoxic with SpO2 in the 60s with witnessed loss of consciousness prompting short was compressions, on EMS arrival patient had strong central pulse.  Patient was transported emergently to the ED with bag mask ventilation assistance and intubated emergently on ED arrival.  On ED arrival patient was found hypothermic, tachypneic, mildly hypertensive.  Lab work significant for  NA 134, Kos 282, creatinine 1.46, albumin 3.3, GFR 47, BNP 1,360 hemoglobin 10.6 lactic 3.3.   Chest x-ray with small to moderate layering right pleural effusion PCCM consulted for further management assistance  Pertinent  Medical History  COPD, HTN, HLD, HFpEF, sleep apnea, diabetes  Significant Hospital Events: Including procedures, antibiotic start and stop dates in addition to other pertinent events   3/16 presented with acute altered mental status and hypoxic respiratory failure, intubated on ED arrival  Interim History / Subjective:  Tolerated trial of SBT, will extubate to BiPAP.  AF, 103/53, maps >65, HR 63, RR 20, vent 40% FiO2. Objective   Blood pressure (!) 88/57, pulse 63, temperature 98.8 F (37.1 C), resp. rate 20, height 6\' 5"  (1.956 m), weight 133.5 kg, SpO2 100%.    Vent Mode: PRVC FiO2 (%):  [40 %-50 %] 40 % Set Rate:  [20 bmp] 20 bmp Vt Set:  [409 mL] 660 mL PEEP:  [8 cmH20] 8 cmH20 Plateau Pressure:   [20 cmH20-25 cmH20] 21 cmH20   In:  3.2 L Urine output: 1.9 L +1.56 L = 3.46L Chest tube output: 320 mL  Labs K3.2, repleted.  Magnesium 2.1.  Intake/Output Summary (Last 24 hours) at 02/01/2024 8119 Last data filed at 02/01/2024 0600 Gross per 24 hour  Intake 3175.62 ml  Output 5495 ml  Net -2319.38 ml    Filed Weights   01/30/24 0245 01/31/24 0500 02/01/24 0326  Weight: 124.4 kg 132.2 kg 133.5 kg   Examination: General: Acute on chronic ill-appearing deconditioned elderly male lying in bed on mechanical ventilation in no acute distress HEENT: ETT, MM pink/moist, PERRL,  Neuro:  CV: s1s2 regular rate and rhythm, no murmur, rubs, or gallops,  PULM: Bilateral rhonchi, right greater than left, no increased work of breathing, tolerating ventilator GI: soft, bowel sounds active in all 4 quadrants, non-tender, non-distended Extremities: warm/dry, no edema  Skin: no rashes or lesions Neuro: awake and alert, following commands.  Labs Potassium 3.4, SCr 1.23 Blood cultures NGTD at 48 hours  Blood cultures NGTD Tracheal aspirate NGTD Resolved Hospital Problem list     Assessment & Plan:  Acute hypoxic and hypercapnic respiratory failure Right pleural effusion Passed SBT, will extubate to BiPAP.  Chest tube output decreasing.   P: -Continue BiPAP. -IV Lasix 80 mg twice daily. -Keep K >4, Mg >2. -Tentative plan to discontinue chest tube after extubation.  Concern for evolving septic shock Maps> 65, negative blood cultures.  P: -Continue doxycycline. -Continue vent as above.  Acute on chronic heart failure. Mitral valve stenosis Atrial flutter  anticoagulated with Xarelto at baseline  - IV fluids as above. - Xarelto for anticoagulation.  Type 2 diabetes Elevated blood glucose from 190-225.  - SSI, will increase sensitivity. - CBG goal 140-180 - CBG checks every 4  Best Practice (right click and "Reselect all SmartList Selections" daily)   Diet/type: Tube  feeds DVT prophylaxis DOAC Pressure ulcer(s): N/A GI prophylaxis: PPI Lines: N/A Foley:  Yes, and it is still needed Code Status:  full code Last date of multidisciplinary goals of care discussion:Pending   Labs   CBC: Recent Labs  Lab 01/29/24 2117 01/30/24 0350 01/31/24 0000 01/31/24 0408 02/01/24 0315  WBC 8.6 7.5 10.4 9.8 8.9  NEUTROABS 6.9  --   --   --   --   HGB 9.6* 9.9* 9.5* 9.3* 9.0*  HCT 31.5* 31.2* 30.5* 29.8* 28.9*  MCV 86.1 84.6 83.8 83.2 84.3  PLT 241 285 264 264 243    Basic Metabolic Panel: Recent Labs  Lab 01/29/24 1500 01/29/24 1515 01/29/24 1550 01/30/24 0350 01/31/24 0408 02/01/24 0315  NA 133* 134*  134* 133* 137 139 136  K 4.9 5.0  5.0 4.7 3.4* 3.4* 3.2*  CL 92* 95*  --  96* 96* 96*  CO2 27  --   --  30 29 29   GLUCOSE 282* 276*  --  97 180* 227*  BUN 25* 24*  --  20 21 18   CREATININE 1.48* 1.40*  --  1.22 1.23 1.15  CALCIUM 9.0  --   --  9.1 8.4* 8.0*  MG  --   --   --  2.0 1.9 2.1  PHOS  --   --   --  2.2* 3.5 2.6   GFR: Estimated Creatinine Clearance: 76.2 mL/min (by C-G formula based on SCr of 1.15 mg/dL). Recent Labs  Lab 01/29/24 1500 01/29/24 2117 01/30/24 0350 01/31/24 0000 01/31/24 0408 02/01/24 0315  PROCALCITON  --  <0.10  --   --   --   --   WBC 7.9 8.6 7.5 10.4 9.8 8.9  LATICACIDVEN 3.8* 2.4*  --   --   --   --     Liver Function Tests: Recent Labs  Lab 01/29/24 1500  AST 49*  ALT 39  ALKPHOS 48  BILITOT 1.0  PROT 7.3  ALBUMIN 3.3*   No results for input(s): "LIPASE", "AMYLASE" in the last 168 hours. No results for input(s): "AMMONIA" in the last 168 hours.  ABG    Component Value Date/Time   PHART 7.443 01/29/2024 1550   PCO2ART 38.9 01/29/2024 1550   PO2ART 196 (H) 01/29/2024 1550   HCO3 27.1 01/29/2024 1550   TCO2 28 01/29/2024 1550   O2SAT 100 01/29/2024 1550     Coagulation Profile: Recent Labs  Lab 01/29/24 2117  INR 1.8*    Cardiac Enzymes: No results for input(s): "CKTOTAL",  "CKMB", "CKMBINDEX", "TROPONINI" in the last 168 hours.  HbA1C: HbA1c, POC (prediabetic range)  Date/Time Value Ref Range Status  08/18/2021 02:40 PM 7.5 (A) 5.7 - 6.4 % Final   HbA1c, POC (controlled diabetic range)  Date/Time Value Ref Range Status  08/18/2021 02:40 PM 7.5 (A) 0.0 - 7.0 % Final   HbA1c POC (<> result, manual entry)  Date/Time Value Ref Range Status  08/18/2021 02:40 PM 7.5 4.0 - 5.6 % Final   Hgb A1c MFr Bld  Date/Time Value Ref Range Status  01/29/2024 09:17 PM 7.2 (H) 4.8 - 5.6 % Final    Comment:    (  NOTE) Pre diabetes:          5.7%-6.4%  Diabetes:              >6.4%  Glycemic control for   <7.0% adults with diabetes   04/26/2023 09:25 PM 5.8 (H) 4.8 - 5.6 % Final    Comment:    (NOTE) Pre diabetes:          5.7%-6.4%  Diabetes:              >6.4%  Glycemic control for   <7.0% adults with diabetes     CBG: Recent Labs  Lab 01/31/24 1107 01/31/24 1523 01/31/24 1954 01/31/24 2345 02/01/24 0309  GLUCAP 192* 231* 203* 220* 225*    Review of Systems:   Unable to assess   Past Medical History:  He,  has a past medical history of Anxiety, Asthma, Benign neoplasm of colon, Bone infection (HCC), Cancer (HCC), COPD (chronic obstructive pulmonary disease) (HCC), Depression, Diverticulosis of colon (without mention of hemorrhage), DJD (degenerative joint disease), Esophageal reflux, Glaucoma, Hidradenitis, History of BPH, History of gynecomastia, Hyperlipidemia, Hypoxia (01/29/2024), Irritable bowel syndrome, Memory loss, Mitral stenosis, MVP (mitral valve prolapse), Obesity, unspecified, Pneumonia, Sleep apnea, Type II or unspecified type diabetes mellitus without mention of complication, not stated as uncontrolled, and Unspecified venous (peripheral) insufficiency.   Surgical History:   Past Surgical History:  Procedure Laterality Date   ACHILLES TENDON SURGERY Right 05/17/2019   Procedure: ACHILLES LENGTHENING/KIDNER;  Surgeon: Park Liter,  DPM;  Location: [facility] OR;  Service: Podiatry;  Laterality: Right;   AMPUTATION  08/09/2012   Procedure: AMPUTATION DIGIT;  Surgeon: Nadara Mustard, MD;  Location: [facility] OR;  Service: Orthopedics;  Laterality: Right;  Amputation right Great Toe MTP Joint   AMPUTATION  10/17/2012   Procedure: AMPUTATION DIGIT;  Surgeon: Nadara Mustard, MD;  Location: [facility] OR;  Service: Orthopedics;  Laterality: Right;  Right Foot 3rd Toe Amputation at Metatarsophalangeal Joint   AMPUTATION Right 03/30/2023   Procedure: TRANSMETATARSAL AMPUTATION, RIGHT FOOT;  Surgeon: Pilar Plate, DPM;  Location: [facility];  Service: Podiatry;  Laterality: Right;   AMPUTATION TOE Right 05/17/2019   Procedure: AMPUTATION TOE Metatarsal Phalangeal Joint   FOURTH AND FIFTH, FILLETED TOE FLAP;  Surgeon: Park Liter, DPM;  Location: [facility] OR;  Service: Podiatry;  Laterality: Right;   BUBBLE STUDY  08/12/2021   Procedure: BUBBLE STUDY;  Surgeon: Sande Rives, MD;  Location: [facility] ENDOSCOPY;  Service: Cardiovascular;;   CHOLECYSTECTOMY N/A 06/14/2018   Procedure: LAPAROSCOPIC CHOLECYSTECTOMY WITH INTRAOPERATIVE CHOLANGIOGRAM;  Surgeon: Rodman Pickle, MD;  Location: [facility];  Service: General;  Laterality: N/A;   CIRCUMCISION  06/2100   For Phimosis - Dr Marcello Fennel   COLON SURGERY     CYSTOSCOPY W/ RETROGRADES Bilateral 07/07/2018   Procedure: CYSTOSCOPY WITH BILATERAL RETROGRADE PYELOGRAM;  Surgeon: Crist Fat, MD;  Location: [facility];  Service: Urology;  Laterality: Bilateral;   KNEE ARTHROSCOPY     LAPAROSCOPIC LYSIS OF ADHESIONS  06/14/2018   Procedure: LAPAROSCOPIC LYSIS OF ADHESIONS;  Surgeon: Sheliah Hatch De Blanch, MD;  Location: [facility];  Service: General;;   LAPAROTOMY  07/20/11   LASER ABLATION  06/2009   Left Greater Saphenous vein -Dr Jenean Lindau OSTEOTOMY Right 05/17/2019   Procedure: METATARSLSECTOMY FOURTH DIGIT;  Surgeon: Park Liter, DPM;  Location: [facility];  Service: Podiatry;  Laterality: Right;    MULTIPLE TOOTH EXTRACTIONS     TEE WITHOUT CARDIOVERSION N/A 08/12/2021  Procedure: TRANSESOPHAGEAL ECHOCARDIOGRAM (TEE);  Surgeon: Sande Rives, MD;  Location: [facility] ENDOSCOPY;  Service: Cardiovascular;  Laterality: N/A;   TOE AMPUTATION  06/2009   x3Right foot second toe -Dr Lestine Box   TONSILLECTOMY     TRANSURETHRAL RESECTION OF BLADDER TUMOR N/A 07/07/2018   Procedure: TRANSURETHRAL RESECTION OF BLADDER TUMOR (TURBT) WITH POST OP INSTILLATION OF GEMCITABIN;  Surgeon: Crist Fat, MD;  Location: [facility];  Service: Urology;  Laterality: N/A;   WOUND DEBRIDEMENT Right 07/25/2019   Procedure: Right foot wound debridement with application of skin graft substitute;  Surgeon: Park Liter, DPM;  Location: [facility];  Service: Podiatry;  Laterality: Right;     Social History:   reports that he quit smoking about 8 years ago. His smoking use included cigarettes. He started smoking about 13 years ago. He has a 0.5 pack-year smoking history. He has never used smokeless tobacco. He reports that he does not currently use alcohol after a past usage of about 7.0 standard drinks of alcohol per week. He reports that he does not use drugs.   Family History:  His family history includes Cancer in his father; Diabetes in his father; High blood pressure in an other family member; Prostate cancer in his father; Stroke in his father. There is no history of Colon cancer, Esophageal cancer, Rectal cancer, or Stomach cancer.   Allergies Allergies  Allergen Reactions   Bactrim [Sulfamethoxazole-Trimethoprim] Rash     Home Medications  Prior to Admission medications   Medication Sig Start Date End Date Taking? Authorizing Provider  acetaminophen (TYLENOL) 325 MG tablet Take 2 tablets (650 mg total) by mouth every 6 (six) hours as needed for mild pain (or Fever >/= 101). 04/02/23   Marinda Elk, MD  B-D UF III MINI PEN NEEDLES 31G X 5 MM MISC  07/10/21   [provider]  Bempedoic  Acid-Ezetimibe (NEXLIZET) 180-10 MG TABS Take 1 tablet by mouth daily. 04/21/23   [name], MD  cadexomer iodine (IODOSORB) 0.9 % gel Apply 1 Application topically daily as needed for wound care. 09/29/23   Standiford, Jenelle Mages, DPM  Continuous Glucose Sensor (FREESTYLE LIBRE 2 SENSOR) MISC 1 Act by Does not apply route daily. Patient taking differently: Inject 1 Device into the skin every 14 (fourteen) days. 04/13/23   [name], MD  cyanocobalamin (VITAMIN B12) 500 MCG tablet Take 500 mcg by mouth daily.    [provider]  docusate sodium (COLACE) 100 MG capsule Take 1 capsule (100 mg total) by mouth 2 (two) times daily. 05/10/23   [place], Alvira Philips, DO  furosemide (LASIX) 40 MG tablet Take 1 tablet (40 mg total) by mouth 2 (two) times daily. 05/10/23 08/25/23  [place], Alvira Philips, DO  insulin aspart (NOVOLOG) 100 UNIT/ML FlexPen Inject 16 Units into the skin 3 (three) times daily with meals. 05/10/23   [place], Alvira Philips, DO  Insulin Pen Needle (B-D UF III MINI PEN NEEDLES) 31G X 5 MM MISC USE 1 PEN NEEDLE IN THE MORNING AND AT BEDTIME 04/05/23   [name], MD  metFORMIN (GLUCOPHAGE) 500 MG tablet Take 2 tablets (1,000 mg total) by mouth 2 (two) times daily with a meal. 04/02/23   Marinda Elk, MD  metoprolol succinate (TOPROL-XL) 25 MG 24 hr tablet Take 0.5 tablets (12.5 mg total) by mouth daily. 05/10/23 08/25/23  [place], Alvira Philips, DO  polyethylene glycol (MIRALAX / GLYCOLAX) 17 g packet Take 17 g by mouth daily as needed  for mild constipation. 05/10/23   [place], Alvira Philips, DO  rivaroxaban (XARELTO) 20 MG TABS tablet Take 1 tablet (20 mg total) by mouth daily. 10/18/22   [name], MD  rosuvastatin (CRESTOR) 40 MG tablet Take 1 tablet (40 mg total) by mouth daily. 04/08/23 08/25/23  [name], MD  thiamine (VITAMIN B-1) 100 MG tablet Take 100 mg by mouth daily.    [provider]  Vitamin D, Ergocalciferol, (DRISDOL) 1.25 MG (50000 UNIT) CAPS capsule Take  50,000 Units by mouth every Wednesday.    [provider]    Laretta Bolster, M.D.  Internal Medicine Resident, PGY-1 [facility]  Pager: [phone] 7:02 AM, 02/01/2024   **Please contact the on call pager after 5 pm and on weekends at [phone].**

## 2024-02-01 NOTE — Inpatient Diabetes Management (Signed)
 Inpatient Diabetes Program Recommendations  AACE/ADA: New Consensus Statement on Inpatient Glycemic Control (2015)  Target Ranges:  Prepandial:   less than 140 mg/dL      Peak postprandial:   less than 180 mg/dL (1-2 hours)      Critically ill patients:  140 - 180 mg/dL   Lab Results  Component Value Date   GLUCAP 233 (H) 02/01/2024   HGBA1C 7.2 (H) 01/29/2024    Latest Reference Range & Units 01/31/24 07:53 01/31/24 11:07 01/31/24 15:23 01/31/24 19:54 01/31/24 23:45 02/01/24 03:09 02/01/24 08:10 02/01/24 11:53  Glucose-Capillary 70 - 99 mg/dL 161 (H) 096 (H) 045 (H) 203 (H) 220 (H) 225 (H) 247 (H) 233 (H)  (H): Data is abnormally high  Diabetes history: DM2 Outpatient Diabetes medications: Novolog 8 units hs if CBGs elevated (?), Metformin 500 mg daily Current orders for Inpatient glycemic control: Novolog 0-9 units q 4 hrs.   Osmolite 60 cc/her tube feeding.  Inpatient Diabetes Program Recommendations:   Please consider: -Add Novolog 2 units q 4 hrs. Tube feed coverage (hold if tube feed held or D/C'd for any reason)  Thank you, Darel Hong E. Adrijana Haros, RN, MSN, CDCES  Diabetes Coordinator Inpatient Glycemic Control Team Team Pager [phone] (8am-5pm) 02/01/2024 12:21 PM

## 2024-02-01 NOTE — Progress Notes (Signed)
 eLink Physician-Brief Progress Note Patient Name: Jason [name] DOB: 26-Aug-1941 MRN: [id number]   Date of Service  02/01/2024  HPI/Events of Note  Extubated today, tube thoracostomy explanted.  Family wondering about chest x-ray results from this evening  eICU Interventions  Radiograph reviewed with family at bedside.  Encourage deep breathing, incentive spirometry, out of bed to chair, and standard airway clearance therapy.     Intervention Category Minor Interventions: Communication with other healthcare providers and/or family;Routine modifications to care plan (e.g. PRN medications for pain, fever)  Rakayla Ricklefs 02/01/2024, 8:59 PM

## 2024-02-02 DIAGNOSIS — I5031 Acute diastolic (congestive) heart failure: Secondary | ICD-10-CM | POA: Diagnosis not present

## 2024-02-02 DIAGNOSIS — J810 Acute pulmonary edema: Secondary | ICD-10-CM | POA: Diagnosis not present

## 2024-02-02 DIAGNOSIS — J9601 Acute respiratory failure with hypoxia: Secondary | ICD-10-CM | POA: Diagnosis not present

## 2024-02-02 LAB — BASIC METABOLIC PANEL
Anion gap: 14 (ref 5–15)
BUN: 17 mg/dL (ref 8–23)
CO2: 26 mmol/L (ref 22–32)
Calcium: 8.5 mg/dL — ABNORMAL LOW (ref 8.9–10.3)
Chloride: 99 mmol/L (ref 98–111)
Creatinine, Ser: 1.11 mg/dL (ref 0.61–1.24)
GFR, Estimated: >60 mL/min (ref >60)
Glucose, Bld: 150 mg/dL — ABNORMAL HIGH (ref 70–99)
Potassium: 3.9 mmol/L (ref 3.5–5.1)
Sodium: 139 mmol/L (ref 135–145)

## 2024-02-02 LAB — CBC
HCT: 28 % — ABNORMAL LOW (ref 39.0–52.0)
Hemoglobin: 8.4 g/dL — ABNORMAL LOW (ref 13.0–17.0)
MCH: 26.3 pg (ref 26.0–34.0)
MCHC: 30 g/dL (ref 30.0–36.0)
MCV: 87.8 fL (ref 80.0–100.0)
Platelets: 221 10*3/uL (ref 150–400)
RBC: 3.19 MIL/uL — ABNORMAL LOW (ref 4.22–5.81)
RDW: 17 % — ABNORMAL HIGH (ref 11.5–15.5)
WBC: 8.2 10*3/uL (ref 4.0–10.5)
nRBC: 0 % (ref 0.0–0.2)

## 2024-02-02 LAB — GLUCOSE, CAPILLARY
Glucose-Capillary: 142 mg/dL — ABNORMAL HIGH (ref 70–99)
Glucose-Capillary: 131 mg/dL — ABNORMAL HIGH (ref 70–99)
Glucose-Capillary: 174 mg/dL — ABNORMAL HIGH (ref 70–99)
Glucose-Capillary: 180 mg/dL — ABNORMAL HIGH (ref 70–99)
Glucose-Capillary: 171 mg/dL — ABNORMAL HIGH (ref 70–99)
Glucose-Capillary: 154 mg/dL — ABNORMAL HIGH (ref 70–99)

## 2024-02-02 LAB — MAGNESIUM: Magnesium: 2 mg/dL (ref 1.7–2.4)

## 2024-02-02 LAB — BODY FLUID CULTURE W GRAM STAIN: Gram Stain: NONE SEEN

## 2024-02-02 LAB — PHOSPHORUS: Phosphorus: 3.2 mg/dL (ref 2.5–4.6)

## 2024-02-02 MED ORDER — POLYETHYLENE GLYCOL 3350 17 G PO PACK
17.0000 g | PACK | Freq: Two times a day (BID) | ORAL | Status: DC
  Administered 2024-02-02 – 2024-02-09 (×7): 17 g via ORAL
  Filled 2024-02-02 (×8): qty 1

## 2024-02-02 MED ORDER — INSULIN ASPART 100 UNIT/ML IJ SOLN
0.0000 [IU] | Freq: Three times a day (TID) | INTRAMUSCULAR | Status: DC
  Administered 2024-02-02 – 2024-02-03 (×4): 2 [IU] via SUBCUTANEOUS
  Administered 2024-02-03: 1 [IU] via SUBCUTANEOUS
  Administered 2024-02-04: 2 [IU] via SUBCUTANEOUS
  Administered 2024-02-04: 3 [IU] via SUBCUTANEOUS
  Administered 2024-02-04: 1 [IU] via SUBCUTANEOUS
  Administered 2024-02-05 – 2024-02-06 (×6): 2 [IU] via SUBCUTANEOUS
  Administered 2024-02-07: 3 [IU] via SUBCUTANEOUS
  Administered 2024-02-07: 2 [IU] via SUBCUTANEOUS
  Administered 2024-02-07: 1 [IU] via SUBCUTANEOUS
  Administered 2024-02-08: 3 [IU] via SUBCUTANEOUS
  Administered 2024-02-08: 2 [IU] via SUBCUTANEOUS

## 2024-02-02 MED ORDER — ENSURE ENLIVE PO LIQD
237.0000 mL | ORAL | Status: DC
  Administered 2024-02-02 – 2024-02-09 (×8): 237 mL via ORAL

## 2024-02-02 MED ORDER — SENNOSIDES-DOCUSATE SODIUM 8.6-50 MG PO TABS
1.0000 | ORAL_TABLET | Freq: Two times a day (BID) | ORAL | Status: DC
  Administered 2024-02-02 – 2024-02-09 (×8): 1 via ORAL
  Filled 2024-02-02 (×9): qty 1

## 2024-02-02 MED ORDER — INSULIN ASPART 100 UNIT/ML IJ SOLN
0.0000 [IU] | Freq: Every day | INTRAMUSCULAR | Status: DC
  Administered 2024-02-03: 2 [IU] via SUBCUTANEOUS
  Administered 2024-02-06: 3 [IU] via SUBCUTANEOUS
  Administered 2024-02-08: 2 [IU] via SUBCUTANEOUS

## 2024-02-02 MED ORDER — FUROSEMIDE 10 MG/ML IJ SOLN
80.0000 mg | Freq: Three times a day (TID) | INTRAMUSCULAR | Status: AC
  Administered 2024-02-02 (×2): 80 mg via INTRAVENOUS
  Filled 2024-02-02 (×2): qty 8

## 2024-02-02 NOTE — Progress Notes (Addendum)
 Nutrition Follow-up  DOCUMENTATION CODES:  Not applicable  INTERVENTION:  Adjust diet to DYS 3 for easier meal consumption Ordering assistance Encourage PO intake Ensure Enlive 1x/d, 350kcal, 20g protein  NUTRITION DIAGNOSIS:  Inadequate oral intake related to inability to eat as evidenced by NPO status. - ongoing  GOAL:  Patient will meet greater than or equal to 90% of their needs - progressing, diet advanced  MONITOR:  TF tolerance, I & O's, Vent status, Labs  REASON FOR ASSESSMENT:  Ventilator, Consult Enteral/tube feeding initiation and management  ASSESSMENT:  Pt with hx of COPD, HTN, HLD, CHF, GERD, IBS, hx bladder cancer, and DM type 2 presented to ED with AMS and respiratory failure. Intubated upon arrival to ED  3/16 - presented to ED, intubated, chest tube placed 3/19 - extubated, chest tube removed  Pt working with PT at the time of assessment. Pt able to be extubated yesterday, diet order in place. Discussed intake with RN. States that he has been doing well but that she has only seen him eat soft items. Will adjust to DYS3 to promote easier meal consumption. No benefit to excessive restrictions from cardiac/carb modified diet in the setting of his acute illness and advanced age.  Discussed in rounds, pt stable to transfer out of ICU  Admit weight: 120.8 kg  Current weight: 125.8 kg Weight appears stable in hx   Intake/Output Summary (Last 24 hours) at 02/02/2024 1037 Last data filed at 02/02/2024 0800 Gross per 24 hour  Intake 138.09 ml  Output 2705 ml  Net -2566.91 ml  Net IO Since Admission: -6,313.03 mL [02/02/24 1037]  Drains/Lines: UOP out x 24 hours  Nutritionally Relevant Medications: Scheduled Meds:  docusate sodium  100 mg Oral BID   doxycycline  100 mg Oral Q12H   furosemide  80 mg Intravenous Q8H   insulin aspart  0-5 Units Subcutaneous QHS   insulin aspart  0-9 Units Subcutaneous TID WC   polyethylene glycol  17 g Oral Daily    rivaroxaban  20 mg Oral Q supper   thiamine  100 mg Oral Daily   Continuous Infusions:  cefTRIAXone (ROCEPHIN)  IV Stopped (02/01/24 0934)   PRN Meds:.docusate sodium, polyethylene glycol  Labs Reviewed: CBG ranges from 131-247 mg/dL over the last 24 hours HgbA1c 7.2%  NUTRITION - FOCUSED PHYSICAL EXAM: Flowsheet Row Most Recent Value  Orbital Region No depletion  Upper Arm Region No depletion  Thoracic and Lumbar Region No depletion  Buccal Region No depletion  Temple Region No depletion  Clavicle Bone Region No depletion  Clavicle and Acromion Bone Region Mild depletion  Scapular Bone Region No depletion  Dorsal Hand No depletion  Patellar Region No depletion  Anterior Thigh Region No depletion  Posterior Calf Region No depletion  Edema (RD Assessment) Mild  [BLE]  Hair Reviewed  Eyes Reviewed  Mouth Reviewed  Skin Reviewed  Nails Reviewed   Diet Order:   Diet Order             Diet heart healthy/carb modified Room service appropriate? Yes; Fluid consistency: Thin  Diet effective now                   EDUCATION NEEDS:  Not appropriate for education at this time  Skin:  Skin Assessment: Reviewed RN Assessment  Last BM:  unsure  Height:  Ht Readings from Last 1 Encounters:  01/29/24 6\' 5"  (1.956 m)    Weight:  Wt Readings from Last 1 Encounters:  02/02/24 125.8 kg    Ideal Body Weight:  94.5 kg  BMI:  Body mass index is 32.89 kg/m.  Estimated Nutritional Needs:  Kcal:  2200-2400 kcal/d Protein:  110-125g/d Fluid:  2.4L/d    Greig Castilla, RD, LDN Registered Dietitian II Please reach out via secure chat Weekend on-call pager # available in [facility]

## 2024-02-02 NOTE — [place] FL2 (Signed)
 [place] MEDICAID FL2 LEVEL OF CARE FORM     IDENTIFICATION  Patient Name: Jason [name] Birthdate: 02-27-1941 Sex: male Admission Date (Current Location): 01/29/2024  [facility] and [place] Number:  Producer, television/film/video and Address:  The . [facility], [address], [place], [place] [address]      Provider Number: [id number]  Attending Physician Name and Address:  Charlott Holler, MD  Relative Name and Phone Number:  Yong Channel; Daughter/HCPOA; [phone]    Current Level of Care: Hospital Recommended Level of Care: Skilled Nursing Facility Prior Approval Number:    Date Approved/Denied:   PASRR Number: [id number]  Discharge Plan: SNF    Current Diagnoses: Patient Active Problem List   Diagnosis Date Noted   Sepsis (HCC) 01/29/2024   Hypoxia 01/29/2024   PAF (paroxysmal atrial fibrillation) (HCC) 05/03/2023   Moderate mitral stenosis 05/03/2023   Acute diastolic CHF (congestive heart failure) (HCC) 04/26/2023   Acute respiratory failure with hypoxia and hypercarbia (HCC) 04/26/2023   Status post amputation of right foot (HCC) 04/26/2023   COPD (chronic obstructive pulmonary disease) (HCC) 04/26/2023   Normocytic anemia 04/26/2023   Hyponatremia 04/26/2023   Elevated INR 04/26/2023   Subacute osteomyelitis of right foot (HCC) 03/30/2023   Diabetic ulcer of right midfoot associated with type 2 diabetes mellitus, with muscle involvement without evidence of necrosis (HCC) 03/29/2023   Gangrene of right foot (HCC) 03/29/2023   Right foot infection 03/29/2023   Encounter for general adult medical examination with abnormal findings 03/19/2022   Insulin-requiring or dependent type II diabetes mellitus (HCC) 03/16/2022   Atrial flutter (HCC) 08/02/2021   Nonrheumatic mitral valve stenosis 01/27/2021   Atherosclerosis of aorta (HCC) 09/30/2020   Abnormal electrocardiogram (ECG) (EKG) 09/30/2020   Thiamine deficiency 07/04/2020   Ulcer of right  foot with necrosis of bone (HCC)    Malignant neoplasm of urinary bladder (HCC) 07/11/2018   Chronic idiopathic constipation 06/12/2018   Complex sleep apnea syndrome 01/12/2016   OSA (obstructive sleep apnea) 12/18/2015   Benign prostatic hyperplasia 02/18/2014   PAD (peripheral artery disease) (HCC) 02/18/2014   DEGENERATIVE JOINT DISEASE 02/20/2008   Diabetes mellitus type 2 with atherosclerosis of arteries of extremities (HCC) 01/26/2008   Hyperlipidemia 01/26/2008   Mitral valve disorder 01/26/2008   GERD 01/26/2008   Irritable bowel syndrome 01/26/2008    Orientation RESPIRATION BLADDER Height & Weight     Self, Place  Normal (Room Air) Incontinent, External catheter Weight: 277 lb 5.4 oz (125.8 kg) Height:  6\' 5"  (195.6 cm)  BEHAVIORAL SYMPTOMS/MOOD NEUROLOGICAL BOWEL NUTRITION STATUS      Continent Diet (Please see dc summary)  AMBULATORY STATUS COMMUNICATION OF NEEDS Skin   Extensive Assist Verbally Other (Comment) (Wound / Incision (Open or Dehisced) 01/29/24 Skin tear Toe (Comment which one) Anterior;Left second toe; Wound / Incision (Open or Dehisced) 01/29/24 Skin tear Foot Anterior;Right)                       Personal Care Assistance Level of Assistance  Bathing, Feeding, Dressing Bathing Assistance: Maximum assistance Feeding assistance: Maximum assistance Dressing Assistance: Maximum assistance     Functional Limitations Info             SPECIAL CARE FACTORS FREQUENCY  PT (By licensed PT), OT (By licensed OT)     PT Frequency: 5x OT Frequency: 5x            Contractures Contractures Info: Not present  Additional Factors Info  Code Status, Allergies Code Status Info: Full Code Allergies Info: Bactrim (Sulfamethoxazole-Trimethoprim)           Current Medications (02/02/2024):  This is the current hospital active medication list Current Facility-Administered Medications  Medication Dose Route Frequency Provider Last Rate Last Admin    cefTRIAXone (ROCEPHIN) 2 g in sodium chloride 0.9 % 100 mL IVPB  2 g Intravenous Q24H Lorin Glass, MD 200 mL/hr at 02/02/24 1100 Infusion Verify at 02/02/24 1100   Chlorhexidine Gluconate Cloth 2 % PADS 6 each  6 each Topical Daily Lorin Glass, MD   6 each at 02/01/24 2200   docusate sodium (COLACE) capsule 100 mg  100 mg Oral BID PRN Paytes, Austin A, RPH       doxycycline (VIBRA-TABS) tablet 100 mg  100 mg Oral Q12H Paytes, Austin A, RPH   100 mg at 02/02/24 1033   feeding supplement (ENSURE ENLIVE / ENSURE PLUS) liquid 237 mL  237 mL Oral Q24H Charlott Holler, MD       furosemide (LASIX) injection 80 mg  80 mg Intravenous Arrie Eastern, MD   80 mg at 02/02/24 1033   insulin aspart (novoLOG) injection 0-5 Units  0-5 Units Subcutaneous QHS Laretta Bolster, MD       insulin aspart (novoLOG) injection 0-9 Units  0-9 Units Subcutaneous TID WC Laretta Bolster, MD   2 Units at 02/02/24 1315   Oral care mouth rinse  15 mL Mouth Rinse PRN Charlott Holler, MD       polyethylene glycol (MIRALAX / GLYCOLAX) packet 17 g  17 g Oral Daily PRN Paytes, Austin A, RPH       polyethylene glycol (MIRALAX / GLYCOLAX) packet 17 g  17 g Oral BID Laretta Bolster, MD       rivaroxaban Carlena Hurl) tablet 20 mg  20 mg Oral Q supper Knute Neu, RPH   20 mg at 02/01/24 1837   senna-docusate (Senokot-S) tablet 1 tablet  1 tablet Oral BID Laretta Bolster, MD       thiamine (VITAMIN B1) tablet 100 mg  100 mg Oral Daily Paytes, Austin A, RPH   100 mg at 02/02/24 1036     Discharge Medications: Please see discharge summary for a list of discharge medications.  Relevant Imaging Results:  Relevant Lab Results:   Additional Information SSN:[id number]  Marliss Coots, LCSW

## 2024-02-02 NOTE — Plan of Care (Signed)

## 2024-02-02 NOTE — Evaluation (Addendum)
 Physical Therapy Evaluation [name] MRN: [id number] DOB: [birth year] Today's Date: 02/02/2024  History of Present Illness  83 yo male admitted 3/16 with acute respiratory distress, AMS, hypoxic, Acutely decompensated HFpEF. Intubated 3/16-3/19. PMhx: COPD, HTN, HLD, HFpEF, sleep apnea, T2DM, AFib, PAD, Rt transmet amputation  Clinical Impression  Pt in recliner with OT stating struggle with low chair. Pt requires 2 person assist to rise from chair and pivot back to bed for toileting as BSC not wide enough and toilet too low. Pt states getting stuck on toilet at home and needs increased time and cues to sequence transfers this session. Pt fatigued after bed to chair with OT and return to bed with both therapists and stated he could not tolerate stepping even a few more feet. Pt lives with wife and daughter has offered to have pt return to her house but she was not available via phone to discuss post acute rehab. Pt with decreased strength, transfers, gait and function who will benefit from acute therapy to maximize mobility and safety. Patient will benefit from continued inpatient follow up therapy, <3 hours/day  SPO2 88-92% on RA with limited activity   RR up to 40 with return to bed      If plan is discharge home, recommend the following: A lot of help with walking and/or transfers;A lot of help with bathing/dressing/bathroom;Help with stairs or ramp for entrance;Assist for transportation   Can travel by private vehicle   No    Equipment Recommendations BSC/3in1  Recommendations for Other Services       Functional Status Assessment Patient has had a recent decline in their functional status and demonstrates the ability to make significant improvements in function in a reasonable and predictable amount of time.     Precautions / Restrictions Precautions Precautions: Fall;Other (comment) Recall of Precautions/Restrictions: Impaired Precaution/Restrictions  Comments: watch sats      Mobility  Bed Mobility Overal bed mobility: Needs Assistance Bed Mobility: Sit to Supine       Sit to supine: Min assist   General bed mobility comments: physical assist to lift legs to surface    Transfers Overall transfer level: Needs assistance   Transfers: Sit to/from Stand, Bed to chair/wheelchair/BSC Sit to Stand: Mod assist, +2 physical assistance   Step pivot transfers: Min assist, +2 safety/equipment       General transfer comment: mod +2 with cues for hand placement, assist to power up from low chair. stepping with RW from chair to bed    Ambulation/Gait                  Stairs            Wheelchair Mobility     Tilt Bed    Modified Rankin (Stroke Patients Only)       Balance Overall balance assessment: Needs assistance, History of Falls Sitting-balance support: No upper extremity supported, Feet supported Sitting balance-Leahy Scale: Fair     Standing balance support: Bilateral upper extremity supported, During functional activity, Reliant on assistive device for balance Standing balance-Leahy Scale: Poor                               Pertinent Vitals/Pain Pain Assessment Pain Assessment: No/denies pain    Home Living Family/patient expects to be discharged to:: Private residence Living Arrangements: Spouse/significant other Available Help at Discharge: Family;Available PRN/intermittently Type of Home: House Home Access: Stairs to  enter Entrance Stairs-Rails: None Entrance Stairs-Number of Steps: 1   Home Layout: One level Home Equipment: Agricultural consultant (2 wheels);Cane - single point;Hospital bed;Wheelchair - manual Additional Comments: daughter's house is 2 level with bed on first floor but a couple steps to get in    Prior Function Prior Level of Function : Needs assist             Mobility Comments: RW, cane, independent depending on day. Has gotten stuck on toilet and needed  assist to rise       Extremity/Trunk Assessment   Upper Extremity Assessment Upper Extremity Assessment: Defer to OT evaluation    Lower Extremity Assessment Lower Extremity Assessment: Generalized weakness    Cervical / Trunk Assessment Cervical / Trunk Assessment: Kyphotic  Communication   Communication Communication: No apparent difficulties    Cognition Arousal: Alert Behavior During Therapy: WFL for tasks assessed/performed   PT - Cognitive impairments: Safety/Judgement, Problem solving, Sequencing                         Following commands: Impaired Following commands impaired: Follows one step commands with increased time     Cueing Cueing Techniques: Verbal cues, Gestural cues, Tactile cues     General Comments      Exercises     Assessment/Plan    PT Assessment Patient needs continued PT services  PT Problem List Decreased strength;Decreased activity tolerance;Decreased balance;Decreased mobility;Decreased knowledge of use of DME       PT Treatment Interventions DME instruction;Gait training;Functional mobility training;Therapeutic activities;Therapeutic exercise;Patient/family education;Balance training    PT Goals (Current goals can be found in the Care Plan section)  Acute Rehab PT Goals Patient Stated Goal: be able to get stronger PT Goal Formulation: With patient Time For Goal Achievement: 02/16/24 Potential to Achieve Goals: Good    Frequency Min 2X/week     Co-evaluation PT/OT/SLP Co-Evaluation/Treatment: Yes Reason for Co-Treatment: Complexity of the patient's impairments (multi-system involvement) PT goals addressed during session: Mobility/safety with mobility;Balance;Proper use of DME         AM-PAC PT "6 Clicks" Mobility  Outcome Measure Help needed turning from your back to your side while in a flat bed without using bedrails?: A Little Help needed moving from lying on your back to sitting on the side of a flat bed  without using bedrails?: A Little Help needed moving to and from a bed to a chair (including a wheelchair)?: A Lot Help needed standing up from a chair using your arms (e.g., wheelchair or bedside chair)?: Total Help needed to walk in hospital room?: Total Help needed climbing 3-5 steps with a railing? : Total 6 Click Score: 11    End of Session Equipment Utilized During Treatment: Gait belt Activity Tolerance: Patient tolerated treatment well Patient left: in bed;with call bell/phone within reach Nurse Communication: Mobility status PT Visit Diagnosis: Other abnormalities of gait and mobility (R26.89);Difficulty in walking, not elsewhere classified (R26.2);Muscle weakness (generalized) (M62.81)    Time: 1210-1229 PT Time Calculation (min) (ACUTE ONLY): 19 min   Charges:   PT Evaluation $PT Eval Moderate Complexity: 1 Mod   PT General Charges $$ ACUTE PT VISIT: 1 Visit         Merryl Hacker, PT Acute Rehabilitation Services Office: [phone]   Cristine Polio 02/02/2024, 12:57 PM

## 2024-02-02 NOTE — Progress Notes (Signed)
 Chaplain paged to unit upon the request of pt's daughter. Chaplain introduced spiritual care and offered support. [name] at the bedside and shared that she is the health care agent for her father and is seeking durable power of attorney. She was under the impression that chaplains could assist with this process. Chaplain explained that we are only authorized to assist with health care power of attorney and counseled regarding other resources for more information and support.   She raised concern in Jason [name]' receipt of care at home due to spouse's worsening dementia and unwillingness to have outside home health providers provide additional assistance. Chaplain offered to connect her to the unit case manager and social work for additional consultation regarding disposition plans, which she accepted and I completed.   Jason [name]. Carley Hammed, M.Div. [facility] Chaplain Pager [phone] Office [phone]

## 2024-02-02 NOTE — Evaluation (Signed)
 Occupational Therapy Evaluation [name] MRN: [id number] DOB: [birth year] Today's Date: 02/02/2024   History of Present Illness   83 yo male admitted 3/16 with acute respiratory distress, AMS, hypoxic, Acutely decompensated HFpEF. Intubated 3/16-3/19. PMhx: COPD, HTN, HLD, HFpEF, sleep apnea, T2DM, AFib, PAD, Rt transmet amputation     Clinical Impressions PTA, pt lives with wife (who has dementia per chart), reports ambulatory with and without AD but has had at least one recent fall. Pt reports typically able to manage ADLs but per family, pt unable to transfer from toilet without assist recently. Pt presents now with deficits in strength, balance, endurance and cognition. Pt requires Min A for bed mobility, Min A x 2 to stand from elevated surfaces and Mod A x 2 from lower surfaces. Due to pt fatigue and balance deficits, +2 needed for bed <> recliner transfers, Min A for UB ADLs and up to Total A for LB ADLs. Daughter present initially during session, reported preference for [facility] services and for pt to discharge to her home. However, based on current deficits and fall risk, feel pt would benefit from continued inpatient follow up therapy, <3 hours/day at DC. Will continue to follow acutely.     If plan is discharge home, recommend the following:   A lot of help with walking and/or transfers;Two people to help with walking and/or transfers;Two people to help with bathing/dressing/bathroom;A lot of help with bathing/dressing/bathroom     Functional Status Assessment   Patient has had a recent decline in their functional status and demonstrates the ability to make significant improvements in function in a reasonable and predictable amount of time.     Equipment Recommendations   Other (comment) (daughter would like drop arm BSC; TBD)     Recommendations for Other Services         Precautions/Restrictions   Precautions Precautions: Fall;Other  (comment) Recall of Precautions/Restrictions: Impaired Precaution/Restrictions Comments: watch sats Restrictions Weight Bearing Restrictions Per Provider Order: No     Mobility Bed Mobility Overal bed mobility: Needs Assistance Bed Mobility: Supine to Sit, Sit to Supine     Supine to sit: Min assist, HOB elevated, Used rails Sit to supine: Min assist   General bed mobility comments: Min A to lift trunk/scoot hips to EOB, able to bring LE off of bed but required assist for LE back to bed    Transfers Overall transfer level: Needs assistance Equipment used: Rolling walker (2 wheels) Transfers: Sit to/from Stand, Bed to chair/wheelchair/BSC Sit to Stand: Min assist, +2 physical assistance, +2 safety/equipment, Mod assist, From elevated surface     Step pivot transfers: Mod assist, +2 safety/equipment, Min assist     General transfer comment: Min A x 2 to stand at bedside with RW and cues for hand placement. MiN A x 2 to step to recliner w/ assist to correct posterior bias. Mod A x 2 to stand from low recliner surface, step back to chair and control posterior bias due to pt fatigue      Balance Overall balance assessment: Needs assistance, History of Falls Sitting-balance support: No upper extremity supported, Feet supported Sitting balance-Leahy Scale: Fair     Standing balance support: Bilateral upper extremity supported, During functional activity, Reliant on assistive device for balance Standing balance-Leahy Scale: Poor                             ADL either performed or assessed  with clinical judgement   ADL Overall ADL's : Needs assistance/impaired Eating/Feeding: Set up;Sitting   Grooming: Minimal assistance;Sitting   Upper Body Bathing: Minimal assistance;Sitting   Lower Body Bathing: Maximal assistance;+2 for safety/equipment;Sit to/from stand   Upper Body Dressing : Minimal assistance;Sitting   Lower Body Dressing: Maximal assistance;+2 for  safety/equipment;Sit to/from stand   Toilet Transfer: Moderate assistance;+2 for safety/equipment;Stand-pivot;BSC/3in1;Rolling walker (2 wheels)   Toileting- Clothing Manipulation and Hygiene: Total assistance;Sit to/from stand;+2 for safety/equipment               Vision Ability to See in Adequate Light: 0 Adequate Patient Visual Report: No change from baseline Vision Assessment?: No apparent visual deficits     Perception         Praxis         Pertinent Vitals/Pain Pain Assessment Pain Assessment: No/denies pain     Extremity/Trunk Assessment Upper Extremity Assessment Upper Extremity Assessment: Generalized weakness;Right hand dominant;RUE deficits/detail RUE Deficits / Details: reports impaired strengthh, ROM and discomfort in this UE for months though unsure why. edematous compared to LUE. grip strength weaker and AAROM shoulder flex to 95*, abduct to 90* RUE Coordination: decreased fine motor   Lower Extremity Assessment Lower Extremity Assessment: Defer to PT evaluation   Cervical / Trunk Assessment Cervical / Trunk Assessment: Kyphotic   Communication Communication Communication: Impaired Factors Affecting Communication: Reduced clarity of speech   Cognition Arousal: Alert Behavior During Therapy: WFL for tasks assessed/performed Cognition: Cognition impaired     Awareness: Intellectual awareness intact, Online awareness impaired Memory impairment (select all impairments): Declarative long-term memory Attention impairment (select first level of impairment): Selective attention Executive functioning impairment (select all impairments): Sequencing, Reasoning, Problem solving OT - Cognition Comments: pleasant, witty humor. difficulty providing clear answers for PLOF. cues for sequencing and safety needed                 Following commands: Impaired Following commands impaired: Follows one step commands with increased time, Follows multi-step  commands inconsistently     Cueing  General Comments   Cueing Techniques: Verbal cues;Gestural cues;Tactile cues  Daughter present initially but stepped out. RN present during session   Exercises     Shoulder Instructions      Home Living Family/patient expects to be discharged to:: Private residence Living Arrangements: Spouse/significant other Available Help at Discharge: Family;Available PRN/intermittently Type of Home: House Home Access: Stairs to enter [employer] of Steps: 1 Entrance Stairs-Rails: None Home Layout: One level     Bathroom Shower/Tub: Chief Strategy Officer: Handicapped height Bathroom Accessibility: Yes   Home Equipment: Agricultural consultant (2 wheels);Cane - single point;Hospital bed;Wheelchair - manual   Additional Comments: daughter's house is 2 level with bed on first floor but 2 steps to get in      Prior Functioning/Environment Prior Level of Function : Needs assist             Mobility Comments: RW, cane, independent depending on day. Has gotten stuck on toilet and needed assist to rise ADLs Comments: reports independence but increased difficulty. per daughter, pt with fall 2 weeks ago and required EMS to assist in getting pt up    OT Problem List: Decreased strength;Decreased activity tolerance;Impaired balance (sitting and/or standing);Decreased cognition;Decreased safety awareness;Decreased knowledge of use of DME or AE;Impaired UE functional use;Decreased coordination;Decreased range of motion   OT Treatment/Interventions: Self-care/ADL training;Therapeutic exercise;Energy conservation;DME and/or AE instruction;Therapeutic activities;Patient/family education      OT Goals(Current goals can be  found in the care plan section)   Acute Rehab OT Goals Patient Stated Goal: daughter wants to take pt home with [facility] services OT Goal Formulation: With patient Time For Goal Achievement: 02/23/24 Potential to Achieve Goals:  Good   OT Frequency:  Min 1X/week    Co-evaluation   Reason for Co-Treatment: Complexity of the patient's impairments (multi-system involvement) PT goals addressed during session: Mobility/safety with mobility;Balance;Proper use of DME        AM-PAC OT "6 Clicks" Daily Activity     Outcome Measure Help from another person eating meals?: A Little Help from another person taking care of personal grooming?: A Little Help from another person toileting, which includes using toliet, bedpan, or urinal?: Total Help from another person bathing (including washing, rinsing, drying)?: A Lot Help from another person to put on and taking off regular upper body clothing?: A Little Help from another person to put on and taking off regular lower body clothing?: A Lot 6 Click Score: 14   End of Session Equipment Utilized During Treatment: Gait belt;Rolling walker (2 wheels) Nurse Communication: Mobility status  Activity Tolerance: Patient tolerated treatment well;Patient limited by fatigue Patient left: in bed;with call bell/phone within reach  OT Visit Diagnosis: Other abnormalities of gait and mobility (R26.89);Unsteadiness on feet (R26.81);Muscle weakness (generalized) (M62.81)                Time: 8756-4332 OT Time Calculation (min): 45 min Charges:  OT General Charges $OT Visit: 1 Visit OT Evaluation $OT Eval Moderate Complexity: 1 Mod OT Treatments $Therapeutic Activity: 8-22 mins  Bradd Canary, OTR/L Acute Rehab Services Office: [phone]   Lorre Munroe 02/02/2024, 1:58 PM

## 2024-02-02 NOTE — TOC Progression Note (Signed)
 Transition of Care [facility]) - Progression Note    Patient Details  Name: Jason [name] MRN: [id number] Date of Birth: Jan 02, 1941  [facility]) CM/SW Contact  Marliss Coots, LCSW Phone Number: 02/02/2024, 9:31 AM  Clinical Narrative:     9:31 AM Per chart review, patient has SDOH needs (transportation and social connections). CSW added resources to patient's AVS.     Barriers to Discharge: Continued Medical Work up  Expected Discharge Plan and Services       Living arrangements for the past 2 months: Single Family Home                                       Social Determinants of Health (SDOH) Interventions SDOH Screenings   Food Insecurity: No Food Insecurity (04/26/2023)  Housing: Low Risk  (02/01/2024)  Transportation Needs: Unmet Transportation Needs (02/01/2024)  Utilities: Not At Risk (02/01/2024)  Alcohol Screen: Low Risk  (06/01/2022)  Depression (PHQ2-9): Low Risk  (06/01/2022)  Financial Resource Strain: Low Risk  (06/01/2022)  Physical Activity: Sufficiently Active (06/01/2022)  Social Connections: Socially Isolated (02/01/2024)  Stress: Stress Concern Present (06/01/2022)  Tobacco Use: Medium Risk (01/29/2024)    Readmission Risk Interventions    04/30/2023   12:25 PM  Readmission Risk Prevention Plan  Transportation Screening Complete  PCP or Specialist Appt within 3-5 Days Complete  HRI or Home Care Consult Complete  Social Work Consult for Recovery Care Planning/Counseling Complete  Palliative Care Screening Complete  Medication Review Oceanographer) Complete

## 2024-02-02 NOTE — Discharge Instructions (Signed)
   Social Connections   [facility] (Adult Program)  - Address: [address] [place], [place] [address] - General office #:  [phone] - Enrollment Phone #: [phone]  [facility]  - Senior Friendship Line: call toll free, available 24 hours a day, at [phone]   [place] [address]  [place] 2-1-1 is another useful way to locate resources in the community. Visit [email or web address].ch to find service information online. If you need additional assistance, 2-1-1 Referral Specialists are available 24 hours a day, every day by dialing 2-1-1 or [phone] from any phone. The call is free, confidential, and available in any language.   -[facility] [place]: [phone] / [address] [place], [place] [address]  -Dial 988: Talk lifeline 24/7.  -[facility] Resource Network Warmline: [phone]

## 2024-02-02 NOTE — TOC Progression Note (Addendum)
 Transition of Care [facility]) - Progression Note    Patient Details  Name: Jason [name] MRN: [id number] Date of Birth: [birth year]  Transition [facility]) CM/SW Contact  Marliss Coots, LCSW Phone Number: 02/02/2024, 2:02 PM  Clinical Narrative:     2:02 PM CSW called patient's daughter/HCPOA, [name], to inform her of therapy recommendation of discharge to SNF (patient disoriented x2). [name] was agreeable with patient discharge to SNF. [name] stated patient has family in [place] area and expressed preferences in SNFs within [place] and [place] counties. CSW offered to provide SNF options with Medicare ratings in person or via email. [name] requested CSW to email SNF options and Medicare ratings once obtained ([email or web address]).  4:46 PM Patient's daughter/HCPOA, [name], called CSW to follow up on HCPOA and SNF transportation questions. CSW answered questions (informed [name] that is patient was fully oriented he would be deferred to for medical decision making purposes, otherwise would defer to [facility] if he was not fully oriented. CSW also informed [name] that ambulance transportation would likely have a copay within 50 mil radius of hospital but would have upfront cost if traveled outside of 50 mile radius. HCPOA requested transportation services to contact, and CSW provided HCPOA with [facility] contact information.  Expected Discharge Plan: Skilled Nursing Facility Barriers to Discharge: Continued Medical Work up, SNF Pending bed offer  Expected Discharge Plan and Services In-house Referral: Clinical Social Work   Post Acute Care Choice: Skilled Nursing Facility Living arrangements for the past 2 months: Single Family Home                                       Social Determinants of Health (SDOH) Interventions SDOH Screenings   Food Insecurity: No Food Insecurity (04/26/2023)  Housing: Low Risk  (02/01/2024)  Transportation Needs: Unmet Transportation Needs (02/01/2024)  Utilities:  Not At Risk (02/01/2024)  Alcohol Screen: Low Risk  (06/01/2022)  Depression (PHQ2-9): Low Risk  (06/01/2022)  Financial Resource Strain: Low Risk  (06/01/2022)  Physical Activity: Sufficiently Active (06/01/2022)  Social Connections: Socially Isolated (02/01/2024)  Stress: Stress Concern Present (06/01/2022)  Tobacco Use: Medium Risk (01/29/2024)    Readmission Risk Interventions    04/30/2023   12:25 PM  Readmission Risk Prevention Plan  Transportation Screening Complete  PCP or Specialist Appt within 3-5 Days Complete  HRI or Home Care Consult Complete  Social Work Consult for Recovery Care Planning/Counseling Complete  Palliative Care Screening Complete  Medication Review Oceanographer) Complete

## 2024-02-02 NOTE — Progress Notes (Signed)
 NAME:  Jason [name], MRN:  [id number], DOB:  [birth year], LOS: 4 ADMISSION DATE:  01/29/2024, CONSULTATION DATE:  01/29/2024 REFERRING MD:  Dr. Clemon Chambers - EDP, CHIEF COMPLAINT: Acute respiratory failure  History of Present Illness:  Jason [name] is a 83 year old male with an extensive past medical history significant for but not limited to COPD, HTN, HLD, HFpEF, sleep apnea, diabetes who presented to the ED 3/16 with complaints of acute respiratory distress and altered mental status.  Family reports patient was somewhat confused prompting EMS call, on first responder presentation patient was found hypoxic with SpO2 in the 60s with witnessed loss of consciousness prompting short was compressions, on EMS arrival patient had strong central pulse.  Patient was transported emergently to the ED with bag mask ventilation assistance and intubated emergently on ED arrival.  On ED arrival patient was found hypothermic, tachypneic, mildly hypertensive.  Lab work significant for  NA 134, Kos 282, creatinine 1.46, albumin 3.3, GFR 47, BNP 1,360 hemoglobin 10.6 lactic 3.3.   Chest x-ray with small to moderate layering right pleural effusion PCCM consulted for further management assistance  Pertinent  Medical History  COPD, HTN, HLD, HFpEF, sleep apnea, diabetes  Significant Hospital Events: Including procedures, antibiotic start and stop dates in addition to other pertinent events   3/16 presented with acute altered mental status and hypoxic respiratory failure, intubated on ED arrival 3/19: Extubated.  Interim History / Subjective:  S/p extubation, Mildly hypotensive and Tachypneic  This morning, daughter at bedside.Satting normal on room air, normal respiratory rate. Daughter asking about filling paperwork for POA.  Objective   Afebrile, Blood pressure (!) 94/54, pulse 78, temperature 99.1 F (37.3 C), temperature source Oral, resp. rate (!) 35, height 6\' 5"  (1.956 m), weight 125.8 kg, SpO2 100%.     Vent Mode: Stand-by FiO2 (%):  [40 %] 40 % Set Rate:  [20 bmp] 20 bmp Vt Set:  [660 mL] 660 mL PEEP:  [5 cmH20] 5 cmH20 Plateau Pressure:  [20 cmH20] 20 cmH20    Intake/Output Summary (Last 24 hours) at 02/02/2024 0649 Last data filed at 02/02/2024 0600 Gross per 24 hour  Intake 679.51 ml  Output 2480 ml  Net -1800.49 ml   In:  0.7 L Urine output: 2.4L   K 3.9 Fasting blood glucose : 150  Novolog 2 units q 4 hrs. Tube feed coverage.   Imaging:  Increased left basilar consolidation after extubation. Stable patchy right basilar consolidation. Trace left pleural effusion.  Filed Weights   01/31/24 0500 02/01/24 0326 02/02/24 0356  Weight: 132.2 kg 133.5 kg 125.8 kg   Examination: General: Acute on chronic ill-appearing deconditioned elderly male lying in bed on mechanical ventilation in no acute distress HEENT: ETT, MM pink/moist, PERRL,  Neuro:  CV: s1s2 regular rate and rhythm, no murmur, rubs, or gallops,  PULM: Bilateral rhonchi, right greater than left, no increased work of breathing, tolerating ventilator GI: soft, bowel sounds active in all 4 quadrants, non-tender, non-distended Extremities: warm/dry, no edema  Skin: no rashes or lesions Neuro: awake and alert, following commands.   Resolved Hospital Problem list     Assessment & Plan:  Acute hypoxic and hypercapnic respiratory failure Right pleural effusion S/p extubation and chest tube removal yesterday, satting normal on room air.  Tolerated IV diuresis with > 1.5 L output. He is medically ready to leave ICU to MedSurg. P: -Will consult chaplain follow-up with DPA. -IV Lasix 80 mg twice daily. -Incentive spirometer -Encourage deep breathing -Keep  K >4, Mg >2.  Left leg cellulitis -Doxycycline [end date 3/22] Acute on chronic heart failure. Mitral valve stenosis Atrial flutter anticoagulated with Xarelto at baseline  - IV fluids as above.  Type 2 diabetes Fasting BG 150.  Postprandial  140-230  - SSI, will increase sensitivity. - CBG goal 140-180  Best Practice (right click and "Reselect all SmartList Selections" daily)   Diet/type: Normal diet. DVT prophylaxis DOAC Pressure ulcer(s): N/A GI prophylaxis: N/A and PPI Lines: N/A Foley:  Yes, and it is still needed Code Status:  full code Last date of multidisciplinary goals of care discussion:Pending   Labs   CBC: Recent Labs  Lab 01/29/24 2117 01/30/24 0350 01/31/24 0000 01/31/24 0408 02/01/24 0315 02/02/24 0359  WBC 8.6 7.5 10.4 9.8 8.9 8.2  NEUTROABS 6.9  --   --   --   --   --   HGB 9.6* 9.9* 9.5* 9.3* 9.0* 8.4*  HCT 31.5* 31.2* 30.5* 29.8* 28.9* 28.0*  MCV 86.1 84.6 83.8 83.2 84.3 87.8  PLT 241 285 264 264 243 221    Basic Metabolic Panel: Recent Labs  Lab 01/29/24 1500 01/29/24 1515 01/29/24 1550 01/30/24 0350 01/31/24 0408 02/01/24 0315 02/02/24 0359  NA 133* 134*  134* 133* 137 139 136 139  K 4.9 5.0  5.0 4.7 3.4* 3.4* 3.2* 3.9  CL 92* 95*  --  96* 96* 96* 99  CO2 27  --   --  30 29 29 26   GLUCOSE 282* 276*  --  97 180* 227* 150*  BUN 25* 24*  --  20 21 18 17   CREATININE 1.48* 1.40*  --  1.22 1.23 1.15 1.11  CALCIUM 9.0  --   --  9.1 8.4* 8.0* 8.5*  MG  --   --   --  2.0 1.9 2.1 2.0  PHOS  --   --   --  2.2* 3.5 2.6 3.2   GFR: Estimated Creatinine Clearance: 76.6 mL/min (by C-G formula based on SCr of 1.11 mg/dL). Recent Labs  Lab 01/29/24 1500 01/29/24 2117 01/30/24 0350 01/31/24 0000 01/31/24 0408 02/01/24 0315 02/02/24 0359  PROCALCITON  --  <0.10  --   --   --   --   --   WBC 7.9 8.6   < > 10.4 9.8 8.9 8.2  LATICACIDVEN 3.8* 2.4*  --   --   --   --   --    < > = values in this interval not displayed.    Liver Function Tests: Recent Labs  Lab 01/29/24 1500  AST 49*  ALT 39  ALKPHOS 48  BILITOT 1.0  PROT 7.3  ALBUMIN 3.3*   No results for input(s): "LIPASE", "AMYLASE" in the last 168 hours. No results for input(s): "AMMONIA" in the last 168  hours.  ABG    Component Value Date/Time   PHART 7.443 01/29/2024 1550   PCO2ART 38.9 01/29/2024 1550   PO2ART 196 (H) 01/29/2024 1550   HCO3 27.1 01/29/2024 1550   TCO2 28 01/29/2024 1550   O2SAT 100 01/29/2024 1550     Coagulation Profile: Recent Labs  Lab 01/29/24 2117  INR 1.8*    Cardiac Enzymes: No results for input(s): "CKTOTAL", "CKMB", "CKMBINDEX", "TROPONINI" in the last 168 hours.  HbA1C: HbA1c, POC (prediabetic range)  Date/Time Value Ref Range Status  08/18/2021 02:40 PM 7.5 (A) 5.7 - 6.4 % Final   HbA1c, POC (controlled diabetic range)  Date/Time Value Ref Range Status  08/18/2021 02:40 PM  7.5 (A) 0.0 - 7.0 % Final   HbA1c POC (<> result, manual entry)  Date/Time Value Ref Range Status  08/18/2021 02:40 PM 7.5 4.0 - 5.6 % Final   Hgb A1c MFr Bld  Date/Time Value Ref Range Status  01/29/2024 09:17 PM 7.2 (H) 4.8 - 5.6 % Final    Comment:    (NOTE) Pre diabetes:          5.7%-6.4%  Diabetes:              >6.4%  Glycemic control for   <7.0% adults with diabetes   04/26/2023 09:25 PM 5.8 (H) 4.8 - 5.6 % Final    Comment:    (NOTE) Pre diabetes:          5.7%-6.4%  Diabetes:              >6.4%  Glycemic control for   <7.0% adults with diabetes     CBG: Recent Labs  Lab 02/01/24 1153 02/01/24 1511 02/01/24 1947 02/01/24 2339 02/02/24 0343  GLUCAP 233* 201* 157* 151* 142*    Review of Systems:   Unable to assess   Past Medical History:  He,  has a past medical history of Anxiety, Asthma, Benign neoplasm of colon, Bone infection (HCC), Cancer (HCC), COPD (chronic obstructive pulmonary disease) (HCC), Depression, Diverticulosis of colon (without mention of hemorrhage), DJD (degenerative joint disease), Esophageal reflux, Glaucoma, Hidradenitis, History of BPH, History of gynecomastia, Hyperlipidemia, Hypoxia (01/29/2024), Irritable bowel syndrome, Memory loss, Mitral stenosis, MVP (mitral valve prolapse), Obesity, unspecified, Pneumonia,  Sleep apnea, Type II or unspecified type diabetes mellitus without mention of complication, not stated as uncontrolled, and Unspecified venous (peripheral) insufficiency.   Surgical History:   Past Surgical History:  Procedure Laterality Date   ACHILLES TENDON SURGERY Right 05/17/2019   Procedure: ACHILLES LENGTHENING/KIDNER;  Surgeon: Park Liter, DPM;  Location: [facility] OR;  Service: Podiatry;  Laterality: Right;   AMPUTATION  08/09/2012   Procedure: AMPUTATION DIGIT;  Surgeon: Nadara Mustard, MD;  Location: [facility] OR;  Service: Orthopedics;  Laterality: Right;  Amputation right Great Toe MTP Joint   AMPUTATION  10/17/2012   Procedure: AMPUTATION DIGIT;  Surgeon: Nadara Mustard, MD;  Location: [facility];  Service: Orthopedics;  Laterality: Right;  Right Foot 3rd Toe Amputation at Metatarsophalangeal Joint   AMPUTATION Right 03/30/2023   Procedure: TRANSMETATARSAL AMPUTATION, RIGHT FOOT;  Surgeon: Pilar Plate, DPM;  Location: [facility];  Service: Podiatry;  Laterality: Right;   AMPUTATION TOE Right 05/17/2019   Procedure: AMPUTATION TOE Metatarsal Phalangeal Joint   FOURTH AND FIFTH, FILLETED TOE FLAP;  Surgeon: Park Liter, DPM;  Location: [facility];  Service: Podiatry;  Laterality: Right;   BUBBLE STUDY  08/12/2021   Procedure: BUBBLE STUDY;  Surgeon: Sande Rives, MD;  Location: [facility] ENDOSCOPY;  Service: Cardiovascular;;   CHOLECYSTECTOMY N/A 06/14/2018   Procedure: LAPAROSCOPIC CHOLECYSTECTOMY WITH INTRAOPERATIVE CHOLANGIOGRAM;  Surgeon: Rodman Pickle, MD;  Location: [facility];  Service: General;  Laterality: N/A;   CIRCUMCISION  06/2100   For Phimosis - Dr Marcello Fennel   COLON SURGERY     CYSTOSCOPY W/ RETROGRADES Bilateral 07/07/2018   Procedure: CYSTOSCOPY WITH BILATERAL RETROGRADE PYELOGRAM;  Surgeon: Crist Fat, MD;  Location: [facility];  Service: Urology;  Laterality: Bilateral;   KNEE ARTHROSCOPY     LAPAROSCOPIC LYSIS OF ADHESIONS  06/14/2018   Procedure: LAPAROSCOPIC LYSIS  OF ADHESIONS;  Surgeon: Sheliah Hatch De Blanch, MD;  Location: [facility];  Service: General;;  LAPAROTOMY  07/20/11   LASER ABLATION  06/2009   Left Greater Saphenous vein -Dr Jenean Lindau OSTEOTOMY Right 05/17/2019   Procedure: METATARSLSECTOMY FOURTH DIGIT;  Surgeon: Park Liter, DPM;  Location: [facility];  Service: Podiatry;  Laterality: Right;   MULTIPLE TOOTH EXTRACTIONS     TEE WITHOUT CARDIOVERSION N/A 08/12/2021   Procedure: TRANSESOPHAGEAL ECHOCARDIOGRAM (TEE);  Surgeon: Sande Rives, MD;  Location: [facility] ENDOSCOPY;  Service: Cardiovascular;  Laterality: N/A;   TOE AMPUTATION  06/2009   x3Right foot second toe -Dr Lestine Box   TONSILLECTOMY     TRANSURETHRAL RESECTION OF BLADDER TUMOR N/A 07/07/2018   Procedure: TRANSURETHRAL RESECTION OF BLADDER TUMOR (TURBT) WITH POST OP INSTILLATION OF GEMCITABIN;  Surgeon: Crist Fat, MD;  Location: [facility];  Service: Urology;  Laterality: N/A;   WOUND DEBRIDEMENT Right 07/25/2019   Procedure: Right foot wound debridement with application of skin graft substitute;  Surgeon: Park Liter, DPM;  Location: [facility];  Service: Podiatry;  Laterality: Right;     Social History:   reports that he quit smoking about 8 years ago. His smoking use included cigarettes. He started smoking about 13 years ago. He has a 0.5 pack-year smoking history. He has never used smokeless tobacco. He reports that he does not currently use alcohol after a past usage of about 7.0 standard drinks of alcohol per week. He reports that he does not use drugs.   Family History:  His family history includes Cancer in his father; Diabetes in his father; High blood pressure in an other family member; Prostate cancer in his father; Stroke in his father. There is no history of Colon cancer, Esophageal cancer, Rectal cancer, or Stomach cancer.   Allergies Allergies  Allergen Reactions   Bactrim [Sulfamethoxazole-Trimethoprim] Rash     Home Medications  Prior to Admission  medications   Medication Sig Start Date End Date Taking? Authorizing Provider  acetaminophen (TYLENOL) 325 MG tablet Take 2 tablets (650 mg total) by mouth every 6 (six) hours as needed for mild pain (or Fever >/= 101). 04/02/23   Marinda Elk, MD  B-D UF III MINI PEN NEEDLES 31G X 5 MM MISC  07/10/21   [provider]  Bempedoic Acid-Ezetimibe (NEXLIZET) 180-10 MG TABS Take 1 tablet by mouth daily. 04/21/23   [name], MD  cadexomer iodine (IODOSORB) 0.9 % gel Apply 1 Application topically daily as needed for wound care. 09/29/23   Standiford, Jenelle Mages, DPM  Continuous Glucose Sensor (FREESTYLE LIBRE 2 SENSOR) MISC 1 Act by Does not apply route daily. Patient taking differently: Inject 1 Device into the skin every 14 (fourteen) days. 04/13/23   [name], MD  cyanocobalamin (VITAMIN B12) 500 MCG tablet Take 500 mcg by mouth daily.    [provider]  docusate sodium (COLACE) 100 MG capsule Take 1 capsule (100 mg total) by mouth 2 (two) times daily. 05/10/23   [place], Alvira Philips, DO  furosemide (LASIX) 40 MG tablet Take 1 tablet (40 mg total) by mouth 2 (two) times daily. 05/10/23 08/25/23  [place], Alvira Philips, DO  insulin aspart (NOVOLOG) 100 UNIT/ML FlexPen Inject 16 Units into the skin 3 (three) times daily with meals. 05/10/23   [place], Alvira Philips, DO  Insulin Pen Needle (B-D UF III MINI PEN NEEDLES) 31G X 5 MM MISC USE 1 PEN NEEDLE IN THE MORNING AND AT BEDTIME 04/05/23   [name], MD  metFORMIN (GLUCOPHAGE) 500 MG tablet Take 2 tablets (  1,000 mg total) by mouth 2 (two) times daily with a meal. 04/02/23   Marinda Elk, MD  metoprolol succinate (TOPROL-XL) 25 MG 24 hr tablet Take 0.5 tablets (12.5 mg total) by mouth daily. 05/10/23 08/25/23  [place], Alvira Philips, DO  polyethylene glycol (MIRALAX / GLYCOLAX) 17 g packet Take 17 g by mouth daily as needed for mild constipation. 05/10/23   [place], Alvira Philips, DO  rivaroxaban (XARELTO) 20 MG TABS tablet Take 1 tablet (20 mg  total) by mouth daily. 10/18/22   [name], MD  rosuvastatin (CRESTOR) 40 MG tablet Take 1 tablet (40 mg total) by mouth daily. 04/08/23 08/25/23  [name], MD  thiamine (VITAMIN B-1) 100 MG tablet Take 100 mg by mouth daily.    [provider]  Vitamin D, Ergocalciferol, (DRISDOL) 1.25 MG (50000 UNIT) CAPS capsule Take 50,000 Units by mouth every Wednesday.    [provider]    Laretta Bolster, M.D.  Internal Medicine Resident, PGY-1 [facility]  Pager: [phone] 6:49 AM, 02/02/2024   **Please contact the on call pager after 5 pm and on weekends at [phone].**

## 2024-02-03 DIAGNOSIS — I5082 Biventricular heart failure: Secondary | ICD-10-CM

## 2024-02-03 DIAGNOSIS — J9602 Acute respiratory failure with hypercapnia: Secondary | ICD-10-CM | POA: Diagnosis not present

## 2024-02-03 DIAGNOSIS — J9601 Acute respiratory failure with hypoxia: Secondary | ICD-10-CM | POA: Diagnosis not present

## 2024-02-03 DIAGNOSIS — I050 Rheumatic mitral stenosis: Secondary | ICD-10-CM | POA: Diagnosis not present

## 2024-02-03 LAB — CULTURE, BLOOD (ROUTINE X 2)
Culture: NO GROWTH
Culture: NO GROWTH
Special Requests: ADEQUATE

## 2024-02-03 LAB — CBC
HCT: 27.4 % — ABNORMAL LOW (ref 39.0–52.0)
Hemoglobin: 8.2 g/dL — ABNORMAL LOW (ref 13.0–17.0)
MCH: 26.2 pg (ref 26.0–34.0)
MCHC: 29.9 g/dL — ABNORMAL LOW (ref 30.0–36.0)
MCV: 87.5 fL (ref 80.0–100.0)
Platelets: 230 10*3/uL (ref 150–400)
RBC: 3.13 MIL/uL — ABNORMAL LOW (ref 4.22–5.81)
RDW: 17.8 % — ABNORMAL HIGH (ref 11.5–15.5)
WBC: 5.4 10*3/uL (ref 4.0–10.5)
nRBC: 0 % (ref 0.0–0.2)

## 2024-02-03 LAB — GLUCOSE, CAPILLARY
Glucose-Capillary: 135 mg/dL — ABNORMAL HIGH (ref 70–99)
Glucose-Capillary: 162 mg/dL — ABNORMAL HIGH (ref 70–99)
Glucose-Capillary: 230 mg/dL — ABNORMAL HIGH (ref 70–99)
Glucose-Capillary: 206 mg/dL — ABNORMAL HIGH (ref 70–99)
Glucose-Capillary: 209 mg/dL — ABNORMAL HIGH (ref 70–99)

## 2024-02-03 LAB — BASIC METABOLIC PANEL
Anion gap: 14 (ref 5–15)
BUN: 22 mg/dL (ref 8–23)
CO2: 28 mmol/L (ref 22–32)
Calcium: 8.8 mg/dL — ABNORMAL LOW (ref 8.9–10.3)
Chloride: 97 mmol/L — ABNORMAL LOW (ref 98–111)
Creatinine, Ser: 1.35 mg/dL — ABNORMAL HIGH (ref 0.61–1.24)
GFR, Estimated: 53 mL/min — ABNORMAL LOW (ref >60)
Glucose, Bld: 152 mg/dL — ABNORMAL HIGH (ref 70–99)
Potassium: 3.9 mmol/L (ref 3.5–5.1)
Sodium: 139 mmol/L (ref 135–145)

## 2024-02-03 LAB — MAGNESIUM: Magnesium: 2.1 mg/dL (ref 1.7–2.4)

## 2024-02-03 LAB — PHOSPHORUS: Phosphorus: 3.4 mg/dL (ref 2.5–4.6)

## 2024-02-03 NOTE — Progress Notes (Signed)
 PROGRESS NOTE  Jason [name]:[id number] DOB: 08/10/1941   PCP: [name], MD  Patient is from: Home.  Lives with wife who has dementia.  Has caregiver.   DOA: 01/29/2024 LOS: 5  Chief complaints Chief Complaint  Patient presents with   Respiratory Distress   Altered Mental Status     Brief Narrative / Interim history: 83 year old M with PMH of diastolic CHF, sev mitral valve stenosis, OSA, atrial flutter on Xarelto, DM-2, HTN, HLD, PAD and chronic pain brought to ED due to altered mental status and respiratory distress, and admitted to ICU with acute on chronic respiratory failure with hypoxia and hypercapnia, CHF exacerbation and pleural effusion.  Reportedly, patient was hypoxic to 60s with witnessed LOC when first responders arrived and had brief CPR.  However, found to have strong pulses when EMS arrived.  In ED, hypothermic, tachypneic with mild hypertension. VBG 7.1/95/75/27 suggesting acute respiratory acidosis.  K3.4. Na 133.  Cr 1.4.  BUN 24.  Hgb 11.  BNP 1360.  Troponin 40.  Lactic acid 3.8.  CXR with small to moderate layering right pleural effusion.  Started on broad-spectrum antibiotics.  Intubated and admitted to ICU.    While in ICU, he had chest tube placement for pleural effusion.  Diuresed with IV Lasix.  TTE with LVEF of 55 to 60%, moderate concentric LVH, D-shaped septum with moderately reduced RVSF, moderate LAE and RAE and severe mitral valve stenosis (not new).   Patient was extubated on 3/19 and transferred to [facility] on 3/21 on room air.  Therapy recommending SNF.  Subjective: Seen and examined earlier this morning.  No major events overnight of this morning.  No complaints.  He is awake and alert and oriented x 4.  Denies chest pain, shortness of breath, GI or UTI symptoms.  Objective: Vitals:   02/03/24 0530 02/03/24 0739 02/03/24 1015 02/03/24 1023  BP: (!) 113/54 (!) 107/56    Pulse: 70 (!) 56    Resp: 17 18    Temp: 98.2 F  (36.8 C) 98 F (36.7 C)    TempSrc: Oral     SpO2: 96% 92% 96% 95%  Weight: 128.5 kg     Height:        Examination:  GENERAL: No apparent distress.  Nontoxic. HEENT: MMM.  Vision and hearing grossly intact.  NECK: Supple.  No apparent JVD.  RESP:  No IWOB.  Fair aeration bilaterally. CVS:  RRR. Heart sounds normal.  ABD/GI/GU: BS+. Abd soft, NTND.  MSK/EXT:  Moves extremities.  Right TMA.  BLE edema/venous insufficiency.  Not able to palpate DP pulses. SKIN: Lower extremity skin changes in keeping with venous sufficiency/chronic PAD. NEURO: Awake, alert and oriented x 4.  No apparent focal neuro deficit. PSYCH: Calm. Normal affect.   Consultants:  Critical care admitted patient  Procedures: Intubation and mechanical ventilation Right chest tube   Microbiology summarized: COVID-19, influenza and RSV PCR nonreactive MRSA PCR screen nonreactive Blood cultures NGTD Respiratory culture normal respiratory flora Right pleural fluid culture NGTD  Assessment and plan: Acute hypoxic and hypercapnic respiratory failure: Present on arrival.  Multifactorial including CHF, underlying COPD, OSA, polypharmacy, encephalopathy and pleural effusion.  Resolved. -Treat treatable causes -Minimum oxygen to keep saturation above 90% -Minimize sedating medications.  Acute on chronic biventricular heart failure: TTE with LVEF of 55 to 60%, moderate concentric LVH, D-shaped septum with moderately reduced RVSF, moderate LAE and RAE and severe mitral valve stenosis (not new).  BNP elevated to 1360.  CXR with mild to moderate right pleural effusion.  Diuresed with IV Lasix.  Status post chest tube for right pleural effusion. -Hold diuretics today -Strict intake and output, daily weight, renal functions and electrolytes  Right pleural effusion: Chest x-ray showed mild to moderate pleural effusion.  Likely due to CHF. -S/p chest tube and diuretics.  Fluid transudative and culture negative. -Manage  CHF as above  Untreated OSA: Patient and family does not recall diagnosis of OSA but his sleep study in 2017 was positive.  Not able to access his spirometry result at the same time.  Not on CPAP or inhalers at home. -Trial of CPAP at night.  IDDM-2 with hyperglycemia: A1c 7.2%. Recent Labs  Lab 02/02/24 1151 02/02/24 1312 02/02/24 1703 02/02/24 1948 02/03/24 0740  GLUCAP 174* 180* 171* 154* 135*  -Continue SSI-sensitive  AKI: Creatinine up after interval improvement likely due to diuretics.  Baseline Cr ~0.9-1.0. Recent Labs    05/03/23 0417 05/04/23 0441 05/18/23 1332 01/29/24 1500 01/29/24 1515 01/30/24 0350 01/31/24 0408 02/01/24 0315 02/02/24 0359 02/03/24 0602  BUN 26* 26* 20 25* 24* 20 21 18 17 22   CREATININE 0.96 0.93 1.18 1.48* 1.40* 1.22 1.23 1.15 1.11 1.35*  -Hold diuretics today -Continue monitoring  Normocytic anemia: Likely due to chronic disease and acute illness.  No overt bleeding.  He is on Xarelto. Recent Labs    01/29/24 1500 01/29/24 1515 01/29/24 1550 01/29/24 2117 01/30/24 0350 01/31/24 0000 01/31/24 0408 02/01/24 0315 02/02/24 0359 02/03/24 0602  HGB 10.6* 12.9*  12.9* 10.9* 9.6* 9.9* 9.5* 9.3* 9.0* 8.4* 8.2*  -Continue monitoring  Atrial flutter anticoagulated with Xarelto at baseline -Continue home Xarelto. -Toprol-XL on hold due to hypotension and bradycardia. -Optimize electrolytes  Left leg cellulitis? He has chronic venous insufficiency in both legs which makes it difficult -Doxycycline [end date 3/22].  He is also on ceftriaxone.  Chronic pain on opiate-Per narcotic database, he had prescription for oxycodone in February and tramadol and December.  Not sure if he is still taking these medications.   Currently not in significant pain or discomfort. -Tylenol as needed for pain -Avoid opiates.  Hyponatremia: Resolved  Physical deconditioning: Daughter reports significant physical deconditioning lately. -PT/OT eval   Class  I obesity Body mass index is 33.59 kg/m. Nutrition Problem: Inadequate oral intake Etiology: inability to eat Signs/Symptoms: NPO status Interventions: Refer to RD note for recommendations   DVT prophylaxis:  rivaroxaban (XARELTO) tablet 20 mg Start: 01/31/24 1700 SCDs Start: 01/29/24 1629 rivaroxaban (XARELTO) tablet 20 mg  Code Status: Full code Family Communication: Updated patient's daughter over the phone Level of care: Telemetry Medical Status is: Inpatient Remains inpatient appropriate because: Respiratory failure, CHF exacerbation and physical deconditioning   Final disposition: SNF   55 minutes with more than 50% spent in reviewing records, counseling patient/family and coordinating care.   Sch Meds:  Scheduled Meds:  doxycycline  100 mg Oral Q12H   feeding supplement  237 mL Oral Q24H   insulin aspart  0-5 Units Subcutaneous QHS   insulin aspart  0-9 Units Subcutaneous TID WC   polyethylene glycol  17 g Oral BID   rivaroxaban  20 mg Oral Q supper   senna-docusate  1 tablet Oral BID   Continuous Infusions:  cefTRIAXone (ROCEPHIN)  IV 2 g (02/03/24 1023)   PRN Meds:.docusate sodium, mouth rinse, polyethylene glycol  Antimicrobials: Anti-infectives (From admission, onward)    Start     Dose/Rate Route Frequency Ordered Stop   02/02/24 1000  doxycycline (VIBRA-TABS) tablet 100 mg        100 mg Oral Every 12 hours 02/01/24 2114 02/06/24 0959   01/31/24 2200  doxycycline (VIBRA-TABS) tablet 100 mg  Status:  Discontinued        100 mg Per Tube Every 12 hours 01/31/24 1452 02/01/24 2114   01/30/24 1000  cefTRIAXone (ROCEPHIN) 2 g in sodium chloride 0.9 % 100 mL IVPB        2 g 200 mL/hr over 30 Minutes Intravenous Every 24 hours 01/29/24 1659 02/06/24 0959   01/30/24 1000  doxycycline (VIBRAMYCIN) 100 mg in sodium chloride 0.9 % 250 mL IVPB  Status:  Discontinued        100 mg 125 mL/hr over 120 Minutes Intravenous Every 12 hours 01/29/24 1659 01/31/24 1452    01/29/24 1715  linezolid (ZYVOX) IVPB 600 mg  Status:  Discontinued        600 mg 300 mL/hr over 60 Minutes Intravenous Every 12 hours 01/29/24 1704 01/30/24 1106   01/29/24 1530  cefTRIAXone (ROCEPHIN) 2 g in sodium chloride 0.9 % 100 mL IVPB        2 g 200 mL/hr over 30 Minutes Intravenous Once 01/29/24 1529 01/29/24 1728   01/29/24 1530  azithromycin (ZITHROMAX) 500 mg in sodium chloride 0.9 % 250 mL IVPB        500 mg 250 mL/hr over 60 Minutes Intravenous  Once 01/29/24 1529 01/29/24 1738        I have personally reviewed the following labs and images: CBC: Recent Labs  Lab 01/29/24 2117 01/30/24 0350 01/31/24 0000 01/31/24 0408 02/01/24 0315 02/02/24 0359 02/03/24 0602  WBC 8.6   < > 10.4 9.8 8.9 8.2 5.4  NEUTROABS 6.9  --   --   --   --   --   --   HGB 9.6*   < > 9.5* 9.3* 9.0* 8.4* 8.2*  HCT 31.5*   < > 30.5* 29.8* 28.9* 28.0* 27.4*  MCV 86.1   < > 83.8 83.2 84.3 87.8 87.5  PLT 241   < > 264 264 243 221 230   < > = values in this interval not displayed.   BMP &GFR Recent Labs  Lab 01/30/24 0350 01/31/24 0408 02/01/24 0315 02/02/24 0359 02/03/24 0602  NA 137 139 136 139 139  K 3.4* 3.4* 3.2* 3.9 3.9  CL 96* 96* 96* 99 97*  CO2 30 29 29 26 28   GLUCOSE 97 180* 227* 150* 152*  BUN 20 21 18 17 22   CREATININE 1.22 1.23 1.15 1.11 1.35*  CALCIUM 9.1 8.4* 8.0* 8.5* 8.8*  MG 2.0 1.9 2.1 2.0 2.1  PHOS 2.2* 3.5 2.6 3.2 3.4   Estimated Creatinine Clearance: 63.7 mL/min (A) (by C-G formula based on SCr of 1.35 mg/dL (H)). Liver & Pancreas: Recent Labs  Lab 01/29/24 1500  AST 49*  ALT 39  ALKPHOS 48  BILITOT 1.0  PROT 7.3  ALBUMIN 3.3*   No results for input(s): "LIPASE", "AMYLASE" in the last 168 hours. No results for input(s): "AMMONIA" in the last 168 hours. Diabetic: No results for input(s): "HGBA1C" in the last 72 hours. Recent Labs  Lab 02/02/24 1151 02/02/24 1312 02/02/24 1703 02/02/24 1948 02/03/24 0740  GLUCAP 174* 180* 171* 154* 135*    Cardiac Enzymes: No results for input(s): "CKTOTAL", "CKMB", "CKMBINDEX", "TROPONINI" in the last 168 hours. No results for input(s): "PROBNP" in the last 8760 hours. Coagulation Profile: Recent Labs  Lab 01/29/24 2117  INR 1.8*   Thyroid Function Tests: No results for input(s): "TSH", "T4TOTAL", "FREET4", "T3FREE", "THYROIDAB" in the last 72 hours. Lipid Profile: No results for input(s): "CHOL", "HDL", "LDLCALC", "TRIG", "CHOLHDL", "LDLDIRECT" in the last 72 hours. Anemia Panel: No results for input(s): "VITAMINB12", "FOLATE", "FERRITIN", "TIBC", "IRON", "RETICCTPCT" in the last 72 hours. Urine analysis:    Component Value Date/Time   COLORURINE AMBER (A) 01/29/2024 1529   APPEARANCEUR HAZY (A) 01/29/2024 1529   LABSPEC 1.019 01/29/2024 1529   PHURINE 5.0 01/29/2024 1529   GLUCOSEU NEGATIVE 01/29/2024 1529   GLUCOSEU >=1000 (A) 03/29/2023 1412   HGBUR NEGATIVE 01/29/2024 1529   BILIRUBINUR NEGATIVE 01/29/2024 1529   BILIRUBINUR neg 03/21/2018 0940   KETONESUR NEGATIVE 01/29/2024 1529   PROTEINUR 100 (A) 01/29/2024 1529   UROBILINOGEN 0.2 03/29/2023 1412   NITRITE NEGATIVE 01/29/2024 1529   LEUKOCYTESUR NEGATIVE 01/29/2024 1529   Sepsis Labs: Invalid input(s): "PROCALCITONIN", "LACTICIDVEN"  Microbiology: Recent Results (from the past 240 hours)  Resp panel by RT-PCR (RSV, Flu A&B, Covid) Urine, Catheterized     Status: None   Collection Time: 01/29/24  3:29 PM   Specimen: Urine, Catheterized; Nasal Swab  Result Value Ref Range Status   SARS Coronavirus 2 by RT PCR NEGATIVE NEGATIVE Final   Influenza A by PCR NEGATIVE NEGATIVE Final   Influenza B by PCR NEGATIVE NEGATIVE Final    Comment: (NOTE) The Xpert Xpress SARS-CoV-2/FLU/RSV plus assay is intended as an aid in the diagnosis of influenza from Nasopharyngeal swab specimens and should not be used as a sole basis for treatment. Nasal washings and aspirates are unacceptable for Xpert Xpress  SARS-CoV-2/FLU/RSV testing.  Fact Sheet for Patients: BloggerCourse.com  Fact Sheet for Healthcare Providers: SeriousBroker.it  This test is not yet approved or cleared by the [place] FDA and has been authorized for detection and/or diagnosis of SARS-CoV-2 by FDA under an Emergency Use Authorization (EUA). This EUA will remain in effect (meaning this test can be used) for the duration of the COVID-19 declaration under Section 564(b)(1) of the Act, 21 U.S.C. section 360bbb-3(b)(1), unless the authorization is terminated or revoked.     Resp Syncytial Virus by PCR NEGATIVE NEGATIVE Final    Comment: (NOTE) Fact Sheet for Patients: BloggerCourse.com  Fact Sheet for Healthcare Providers: SeriousBroker.it  This test is not yet approved or cleared by the [place] FDA and has been authorized for detection and/or diagnosis of SARS-CoV-2 by FDA under an Emergency Use Authorization (EUA). This EUA will remain in effect (meaning this test can be used) for the duration of the COVID-19 declaration under Section 564(b)(1) of the Act, 21 U.S.C. section 360bbb-3(b)(1), unless the authorization is terminated or revoked.  Performed at [facility], [address] [place], [place] [address]   Blood Culture (routine x 2)     Status: None   Collection Time: 01/29/24  3:29 PM   Specimen: BLOOD  Result Value Ref Range Status   Specimen Description BLOOD LEFT ANTECUBITAL  Final   Special Requests   Final    BOTTLES DRAWN AEROBIC AND ANAEROBIC Blood Culture results may not be optimal due to an inadequate volume of blood received in culture bottles   Culture   Final    NO GROWTH 5 DAYS Performed at [facility], [address] [place], [place] [address]    Report Status 02/03/2024 FINAL  Final  MRSA Next Gen by PCR, Nasal     Status: None   Collection Time:  01/29/24  5:10  PM   Specimen: Pleural; Nasal Swab  Result Value Ref Range Status   MRSA by PCR Next Gen NOT DETECTED NOT DETECTED Final    Comment: (NOTE) The GeneXpert MRSA Assay (FDA approved for NASAL specimens only), is one component of a comprehensive MRSA colonization surveillance program. It is not intended to diagnose MRSA infection nor to guide or monitor treatment for MRSA infections. Test performance is not FDA approved in patients less than 71 years old. Performed at [facility], [address], [place], [place] [address]   Culture, Respiratory w Gram Stain     Status: None   Collection Time: 01/29/24  5:11 PM   Specimen: Tracheal Aspirate; Respiratory  Result Value Ref Range Status   Specimen Description TRACHEAL ASPIRATE  Final   Special Requests NONE  Final   Gram Stain   Final    FEW WBC PRESENT, PREDOMINANTLY PMN FEW SQUAMOUS EPITHELIAL CELLS PRESENT ABUNDANT GRAM POSITIVE COCCI FEW GRAM NEGATIVE RODS    Culture   Final    ABUNDANT Normal respiratory flora-no Staph aureus or Pseudomonas seen Performed at [facility], [address] [place], [place] [address]    Report Status 02/01/2024 FINAL  Final  Body fluid culture w Gram Stain     Status: None   Collection Time: 01/29/24  6:00 PM   Specimen: Pleural Fluid  Result Value Ref Range Status   Specimen Description PLEURAL  Final   Special Requests RIGHT  Final   Gram Stain NO WBC SEEN NO ORGANISMS SEEN   Final   Culture   Final    NO GROWTH 3 DAYS Performed at [facility], [address], [place], [place] [address]    Report Status 02/02/2024 FINAL  Final  Blood Culture (routine x 2)     Status: None   Collection Time: 01/29/24  7:45 PM   Specimen: BLOOD  Result Value Ref Range Status   Specimen Description BLOOD SITE NOT SPECIFIED  Final   Special Requests   Final    BOTTLES DRAWN AEROBIC AND ANAEROBIC Blood Culture adequate volume   Culture   Final    NO GROWTH 5 DAYS Performed at [facility], [address], [place], [place] [address]    Report Status 02/03/2024 FINAL  Final    Radiology Studies: No results found.    Jany Buckwalter T. Ula Couvillon [facility]  If 7PM-7AM, please contact night-coverage [email or web address] 02/03/2024, 11:55 AM

## 2024-02-03 NOTE — Care Management Important Message (Signed)
 Important Message  Patient Details  Name: Jason [name] MRN: [id number] Date of Birth: 1941/01/28   Important Message Given:        Dorena Bodo 02/03/2024, 2:34 PM

## 2024-02-03 NOTE — Plan of Care (Signed)
  Problem: Education: Goal: Knowledge of General Education information will improve Description: Including pain rating scale, medication(s)/side effects and non-pharmacologic comfort measures Outcome: Progressing   Problem: Clinical Measurements: Goal: Will remain free from infection Outcome: Progressing Goal: Respiratory complications will improve Outcome: Progressing   Problem: Activity: Goal: Risk for activity intolerance will decrease Outcome: Progressing   Problem: Coping: Goal: Level of anxiety will decrease Outcome: Progressing   Problem: Pain Managment: Goal: General experience of comfort will improve and/or be controlled Outcome: Progressing   Problem: Safety: Goal: Ability to remain free from injury will improve Outcome: Progressing

## 2024-02-03 NOTE — Progress Notes (Signed)
 Chaplain responded to a request to provide information about the Advance Directives form. Pt welcomed Chaplain and received information. Pt requested some time to fill out the form. Chaplain asked Pt to notify when the form is ready for notarization.  Oneida Alar Chaplain Resident   02/03/24 [phone]  Spiritual Encounters  Type of Visit Initial  Care provided to: Patient  Referral source Clinical staff  Reason for visit Advance directives  OnCall Visit No

## 2024-02-03 NOTE — TOC Progression Note (Addendum)
 Transition of Care [facility]) - Progression Note    Patient Details  Name: Jason [name] MRN: [id number] Date of Birth: 1941-01-30  [facility] CM/SW Contact  Michaela Corner, [place] Phone Number: 02/03/2024, 12:04 PM  Clinical Narrative:   CSW called the following SNFs below regarding patients referral.   - [facility]: left VM for admissions. At 12:42PM, Lauris Poag w/ admissions called CSW back and they will need CSW to resubmit referral (fax [phone]).   - [facility]: left VM for admissions  - [facility]: Admissions asked for CSW to secure email referral ([email or web address])  - [facility]: left VM for admissions  - [facility]: Left VM for Namon Cirri (Admissions - phone [phone])  - [facility] [place]: left VM for admissions  - [facility]: Will call CSW back, but does not have beds available. Need to fax referral to [phone].   - [facility]: left VM for admissions  - [facility]:  left VM for admissions  - [facility]: left VM for admissions  - [facility]: Need to fax referral to [phone]  - [facility]:  left VM for admissions. Per VM, to call the front desk dial [phone].   - [facility]: Need to fax referral to [phone]  - [facility]: Need to resubmit referral to [email or web address]   1:50 PM CSW emailed patients dtr, [name], medicare.gov ratings of current accepting SNFs.   1:50 PM CSW to resubmit referrals to facilities that expressed they did not receive one at this time.   TOC will continue to follow.    Expected Discharge Plan: Skilled Nursing Facility Barriers to Discharge: Continued Medical Work up, SNF Pending bed offer  Expected Discharge Plan and Services In-house Referral: Clinical Social Work   Post Acute Care Choice: Skilled Nursing Facility Living arrangements for the past 2 months: Single Family  Home  Social Determinants of Health (SDOH) Interventions SDOH Screenings   Food Insecurity: No Food Insecurity (04/26/2023)  Housing: Low Risk  (02/01/2024)  Transportation Needs: Unmet Transportation Needs (02/01/2024)  Utilities: Not At Risk (02/01/2024)  Alcohol Screen: Low Risk  (06/01/2022)  Depression (PHQ2-9): Low Risk  (06/01/2022)  Financial Resource Strain: Low Risk  (06/01/2022)  Physical Activity: Sufficiently Active (06/01/2022)  Social Connections: Socially Isolated (02/01/2024)  Stress: Stress Concern Present (06/01/2022)  Tobacco Use: Medium Risk (01/29/2024)    Readmission Risk Interventions    04/30/2023   12:25 PM  Readmission Risk Prevention Plan  Transportation Screening Complete  PCP or Specialist Appt within 3-5 Days Complete  HRI or Home Care Consult Complete  Social Work Consult for Recovery Care Planning/Counseling Complete  Palliative Care Screening Complete  Medication Review Oceanographer) Complete

## 2024-02-04 ENCOUNTER — Encounter (HOSPITAL_COMMUNITY)

## 2024-02-04 DIAGNOSIS — J9601 Acute respiratory failure with hypoxia: Secondary | ICD-10-CM | POA: Diagnosis not present

## 2024-02-04 DIAGNOSIS — I050 Rheumatic mitral stenosis: Secondary | ICD-10-CM | POA: Diagnosis not present

## 2024-02-04 DIAGNOSIS — I5082 Biventricular heart failure: Secondary | ICD-10-CM | POA: Diagnosis not present

## 2024-02-04 DIAGNOSIS — J9602 Acute respiratory failure with hypercapnia: Secondary | ICD-10-CM | POA: Diagnosis not present

## 2024-02-04 LAB — CBC
HCT: 28.8 % — ABNORMAL LOW (ref 39.0–52.0)
Hemoglobin: 8.8 g/dL — ABNORMAL LOW (ref 13.0–17.0)
MCH: 26.6 pg (ref 26.0–34.0)
MCHC: 30.6 g/dL (ref 30.0–36.0)
MCV: 87 fL (ref 80.0–100.0)
Platelets: 228 10*3/uL (ref 150–400)
RBC: 3.31 MIL/uL — ABNORMAL LOW (ref 4.22–5.81)
RDW: 17.6 % — ABNORMAL HIGH (ref 11.5–15.5)
WBC: 4.6 10*3/uL (ref 4.0–10.5)
nRBC: 0 % (ref 0.0–0.2)

## 2024-02-04 LAB — RENAL FUNCTION PANEL
Albumin: 2.9 g/dL — ABNORMAL LOW (ref 3.5–5.0)
Anion gap: 11 (ref 5–15)
BUN: 20 mg/dL (ref 8–23)
CO2: 29 mmol/L (ref 22–32)
Calcium: 8.7 mg/dL — ABNORMAL LOW (ref 8.9–10.3)
Chloride: 100 mmol/L (ref 98–111)
Creatinine, Ser: 1.12 mg/dL (ref 0.61–1.24)
GFR, Estimated: >60 mL/min (ref >60)
Glucose, Bld: 169 mg/dL — ABNORMAL HIGH (ref 70–99)
Phosphorus: 3.2 mg/dL (ref 2.5–4.6)
Potassium: 3.6 mmol/L (ref 3.5–5.1)
Sodium: 140 mmol/L (ref 135–145)

## 2024-02-04 LAB — GLUCOSE, CAPILLARY
Glucose-Capillary: 147 mg/dL — ABNORMAL HIGH (ref 70–99)
Glucose-Capillary: 176 mg/dL — ABNORMAL HIGH (ref 70–99)
Glucose-Capillary: 202 mg/dL — ABNORMAL HIGH (ref 70–99)
Glucose-Capillary: 186 mg/dL — ABNORMAL HIGH (ref 70–99)

## 2024-02-04 LAB — MAGNESIUM: Magnesium: 2.1 mg/dL (ref 1.7–2.4)

## 2024-02-04 MED ORDER — DAPAGLIFLOZIN PROPANEDIOL 10 MG PO TABS
10.0000 mg | ORAL_TABLET | Freq: Every day | ORAL | Status: DC
  Administered 2024-02-04 – 2024-02-09 (×6): 10 mg via ORAL
  Filled 2024-02-04 (×7): qty 1

## 2024-02-04 MED ORDER — INSULIN GLARGINE-YFGN 100 UNIT/ML ~~LOC~~ SOPN: 6.0000 [IU] | PEN_INJECTOR | SUBCUTANEOUS | Status: DC

## 2024-02-04 NOTE — Progress Notes (Addendum)
 PROGRESS NOTE  Jason [name] ZOX:[id number] DOB: November 03, 1941   PCP: [name], MD  Patient is from: Home.  Lives with wife who has dementia.  Has caregiver.   DOA: 01/29/2024 LOS: 6  Chief complaints Chief Complaint  Patient presents with   Respiratory Distress   Altered Mental Status     Brief Narrative / Interim history: 83 year old M with PMH of diastolic CHF, sev mitral valve stenosis, OSA, atrial flutter on Xarelto, DM-2, HTN, HLD, PAD and chronic pain brought to ED due to altered mental status and respiratory distress, and admitted to ICU with acute on chronic respiratory failure with hypoxia and hypercapnia, CHF exacerbation and pleural effusion.  Reportedly, patient was hypoxic to 60s with witnessed LOC when first responders arrived and had brief CPR.  However, found to have strong pulses when EMS arrived.  In ED, hypothermic, tachypneic with mild hypertension. VBG 7.1/95/75/27 suggesting acute respiratory acidosis.  K3.4. Na 133.  Cr 1.4.  BUN 24.  Hgb 11.  BNP 1360.  Troponin 40.  Lactic acid 3.8.  CXR with small to moderate layering right pleural effusion.  Started on broad-spectrum antibiotics.  Intubated and admitted to ICU.    While in ICU, he had chest tube placement for pleural effusion.  Diuresed with IV Lasix.  TTE with LVEF of 55 to 60%, moderate concentric LVH, D-shaped septum with moderately reduced RVSF, moderate LAE and RAE and severe mitral valve stenosis (not new).   Patient was extubated on 3/19 and transferred to [facility] on 3/21 on room air.  Therapy recommending SNF.  Medically stable for discharge.  Subjective: Seen and examined earlier this morning.  No major events overnight of this morning.  No complaints other than some pain in his right foot that he attributes to callus.  Objective: Vitals:   02/04/24 0004 02/04/24 0500 02/04/24 0744 02/04/24 1139  BP: 124/65 (!) 122/59 (!) 119/52 123/68  Pulse: 65 62 63 (!) 59  Resp: 18 20 13 13    Temp: 98.7 F (37.1 C) 98.4 F (36.9 C) 98.8 F (37.1 C) 98.4 F (36.9 C)  TempSrc: Oral Oral Oral Oral  SpO2: 97% 92% (!) 89% 95%  Weight:  131.3 kg    Height:        Examination:  GENERAL: No apparent distress.  Nontoxic. HEENT: MMM.  Vision and hearing grossly intact.  NECK: Supple.  No apparent JVD.  RESP:  No IWOB.  Fair aeration bilaterally. CVS:  RRR. Heart sounds normal.  ABD/GI/GU: BS+. Abd soft, NTND.  MSK/EXT:  Moves extremities.  Right TMA.  BLE edema/venous insufficiency.  Not able to palpate DP pulses.  SKIN: Lower extremity skin changes in keeping with venous sufficiency/chronic PAD. NEURO: Awake, alert and oriented x 4.  No apparent focal neuro deficit. PSYCH: Calm. Normal affect.   Consultants:  Critical care admitted patient  Procedures: Intubation and mechanical ventilation Right chest tube   Microbiology summarized: COVID-19, influenza and RSV PCR nonreactive MRSA PCR screen nonreactive Blood cultures NGTD Respiratory culture normal respiratory flora Right pleural fluid culture NGTD  Assessment and plan: Acute hypoxic and hypercapnic respiratory failure: Present on arrival.  Multifactorial including CHF, underlying COPD, OSA, polypharmacy, encephalopathy and pleural effusion.  Resolved. -Treat treatable causes -Minimum oxygen to keep saturation above 90% -Minimize sedating medications.  Acute on chronic biventricular heart failure: TTE with LVEF of 55 to 60%, moderate concentric LVH, D-shaped septum with moderately reduced RVSF, moderate LAE and RAE and severe mitral valve stenosis (not new).  BNP elevated to 1360.  CXR with mild to moderate right pleural effusion.  Diuresed with IV Lasix.  S/p chest tube for right pleural effusion. -Continue holding diuretics. -Start Farxiga 10 g daily. -Strict intake and output, daily weight, renal functions and electrolytes  Right pleural effusion: Chest x-ray showed mild to moderate pleural effusion.  Likely  due to CHF. -S/p chest tube and diuretics.  Fluid transudative and culture negative. -Manage CHF as above  Untreated OSA: Patient and family does not recall diagnosis of OSA but his sleep study in 2017 was positive.  Not able to access his spirometry result at the same time.  Not on CPAP or inhalers at home. -Refused trial of CPAP here.  IDDM-2 with hyperglycemia: A1c 7.2%. Recent Labs  Lab 02/03/24 1533 02/03/24 1715 02/03/24 2039 02/04/24 0741 02/04/24 1136  GLUCAP 230* 206* 209* 147* 176*  -Continue SSI-sensitive -Added Farxiga as above  AKI: Baseline Cr ~0.9-1.0.  AKI resolved. Recent Labs    05/04/23 0441 05/18/23 1332 01/29/24 1500 01/29/24 1515 01/30/24 0350 01/31/24 0408 02/01/24 0315 02/02/24 0359 02/03/24 0602 02/04/24 0656  BUN 26* 20 25* 24* 20 21 18 17 22 20   CREATININE 0.93 1.18 1.48* 1.40* 1.22 1.23 1.15 1.11 1.35* 1.12  -Hold diuretics today -Continue monitoring  Normocytic anemia: Likely due to chronic disease and acute illness.  No overt bleeding.  He is on Xarelto.  H&H stable now. Recent Labs    01/29/24 1515 01/29/24 1550 01/29/24 2117 01/30/24 0350 01/31/24 0000 01/31/24 0408 02/01/24 0315 02/02/24 0359 02/03/24 0602 02/04/24 0656  HGB 12.9*  12.9* 10.9* 9.6* 9.9* 9.5* 9.3* 9.0* 8.4* 8.2* 8.8*  -Continue monitoring -Check anemia panel in the morning  Atrial flutter anticoagulated with Xarelto at baseline -Continue home Xarelto. -Toprol-XL on hold due to borderline bradycardia. -Optimize electrolytes  Bilateral PAD/possible cellulitis: ABI in 03/2023 with noncompressible lower extremity arthritis bilaterally.  He seems to have chronic venous insufficiency as well.  He is on Xarelto.  He is LDL is 114 in 03/2023.  He is not on a statin. -Recheck lipid panel -Continue doxycycline and ceftriaxone until 3/24. -Continue Xarelto -Hesitant to add low-dose aspirin given his anemia.  Chronic pain on opiate-Per narcotic database, he had  prescription for oxycodone in February and tramadol and December.  Denies current use.   Currently not in significant pain or discomfort. -Tylenol as needed for pain -Avoid opiates.  Hyponatremia: Resolved  Physical deconditioning: Daughter reports significant physical deconditioning lately. -PT/OT recommended SNF.   Class I obesity Body mass index is 34.33 kg/m. Nutrition Problem: Inadequate oral intake Etiology: inability to eat Signs/Symptoms: NPO status Interventions: Refer to RD note for recommendations   DVT prophylaxis:  rivaroxaban (XARELTO) tablet 20 mg Start: 01/31/24 1700 rivaroxaban (XARELTO) tablet 20 mg  Code Status: Full code Family Communication: None at bedside today. Level of care: Telemetry Medical Status is: Inpatient Remains inpatient appropriate because: Respiratory failure, CHF exacerbation and physical deconditioning   Final disposition: SNF   55 minutes with more than 50% spent in reviewing records, counseling patient/family and coordinating care.   Sch Meds:  Scheduled Meds:  dapagliflozin propanediol  10 mg Oral Daily   doxycycline  100 mg Oral Q12H   feeding supplement  237 mL Oral Q24H   insulin aspart  0-5 Units Subcutaneous QHS   insulin aspart  0-9 Units Subcutaneous TID WC   polyethylene glycol  17 g Oral BID   rivaroxaban  20 mg Oral Q supper   senna-docusate  1 tablet Oral BID   Continuous Infusions:  cefTRIAXone (ROCEPHIN)  IV 2 g (02/04/24 1014)   PRN Meds:.docusate sodium, mouth rinse, polyethylene glycol  Antimicrobials: Anti-infectives (From admission, onward)    Start     Dose/Rate Route Frequency Ordered Stop   02/02/24 1000  doxycycline (VIBRA-TABS) tablet 100 mg        100 mg Oral Every 12 hours 02/01/24 2114 02/06/24 0959   01/31/24 2200  doxycycline (VIBRA-TABS) tablet 100 mg  Status:  Discontinued        100 mg Per Tube Every 12 hours 01/31/24 1452 02/01/24 2114   01/30/24 1000  cefTRIAXone (ROCEPHIN) 2 g in  sodium chloride 0.9 % 100 mL IVPB        2 g 200 mL/hr over 30 Minutes Intravenous Every 24 hours 01/29/24 1659 02/06/24 0959   01/30/24 1000  doxycycline (VIBRAMYCIN) 100 mg in sodium chloride 0.9 % 250 mL IVPB  Status:  Discontinued        100 mg 125 mL/hr over 120 Minutes Intravenous Every 12 hours 01/29/24 1659 01/31/24 1452   01/29/24 1715  linezolid (ZYVOX) IVPB 600 mg  Status:  Discontinued        600 mg 300 mL/hr over 60 Minutes Intravenous Every 12 hours 01/29/24 1704 01/30/24 1106   01/29/24 1530  cefTRIAXone (ROCEPHIN) 2 g in sodium chloride 0.9 % 100 mL IVPB        2 g 200 mL/hr over 30 Minutes Intravenous Once 01/29/24 1529 01/29/24 1728   01/29/24 1530  azithromycin (ZITHROMAX) 500 mg in sodium chloride 0.9 % 250 mL IVPB        500 mg 250 mL/hr over 60 Minutes Intravenous  Once 01/29/24 1529 01/29/24 1738        I have personally reviewed the following labs and images: CBC: Recent Labs  Lab 01/29/24 2117 01/30/24 0350 01/31/24 0408 02/01/24 0315 02/02/24 0359 02/03/24 0602 02/04/24 0656  WBC 8.6   < > 9.8 8.9 8.2 5.4 4.6  NEUTROABS 6.9  --   --   --   --   --   --   HGB 9.6*   < > 9.3* 9.0* 8.4* 8.2* 8.8*  HCT 31.5*   < > 29.8* 28.9* 28.0* 27.4* 28.8*  MCV 86.1   < > 83.2 84.3 87.8 87.5 87.0  PLT 241   < > 264 243 221 230 228   < > = values in this interval not displayed.   BMP &GFR Recent Labs  Lab 01/31/24 0408 02/01/24 0315 02/02/24 0359 02/03/24 0602 02/04/24 0656  NA 139 136 139 139 140  K 3.4* 3.2* 3.9 3.9 3.6  CL 96* 96* 99 97* 100  CO2 29 29 26 28 29   GLUCOSE 180* 227* 150* 152* 169*  BUN 21 18 17 22 20   CREATININE 1.23 1.15 1.11 1.35* 1.12  CALCIUM 8.4* 8.0* 8.5* 8.8* 8.7*  MG 1.9 2.1 2.0 2.1 2.1  PHOS 3.5 2.6 3.2 3.4 3.2   Estimated Creatinine Clearance: 77.6 mL/min (by C-G formula based on SCr of 1.12 mg/dL). Liver & Pancreas: Recent Labs  Lab 01/29/24 1500 02/04/24 0656  AST 49*  --   ALT 39  --   ALKPHOS 48  --   BILITOT  1.0  --   PROT 7.3  --   ALBUMIN 3.3* 2.9*   No results for input(s): "LIPASE", "AMYLASE" in the last 168 hours. No results for input(s): "AMMONIA" in the last 168 hours. Diabetic: No  results for input(s): "HGBA1C" in the last 72 hours. Recent Labs  Lab 02/03/24 1533 02/03/24 1715 02/03/24 2039 02/04/24 0741 02/04/24 1136  GLUCAP 230* 206* 209* 147* 176*   Cardiac Enzymes: No results for input(s): "CKTOTAL", "CKMB", "CKMBINDEX", "TROPONINI" in the last 168 hours. No results for input(s): "PROBNP" in the last 8760 hours. Coagulation Profile: Recent Labs  Lab 01/29/24 2117  INR 1.8*   Thyroid Function Tests: No results for input(s): "TSH", "T4TOTAL", "FREET4", "T3FREE", "THYROIDAB" in the last 72 hours. Lipid Profile: No results for input(s): "CHOL", "HDL", "LDLCALC", "TRIG", "CHOLHDL", "LDLDIRECT" in the last 72 hours. Anemia Panel: No results for input(s): "VITAMINB12", "FOLATE", "FERRITIN", "TIBC", "IRON", "RETICCTPCT" in the last 72 hours. Urine analysis:    Component Value Date/Time   COLORURINE AMBER (A) 01/29/2024 1529   APPEARANCEUR HAZY (A) 01/29/2024 1529   LABSPEC 1.019 01/29/2024 1529   PHURINE 5.0 01/29/2024 1529   GLUCOSEU NEGATIVE 01/29/2024 1529   GLUCOSEU >=1000 (A) 03/29/2023 1412   HGBUR NEGATIVE 01/29/2024 1529   BILIRUBINUR NEGATIVE 01/29/2024 1529   BILIRUBINUR neg 03/21/2018 0940   KETONESUR NEGATIVE 01/29/2024 1529   PROTEINUR 100 (A) 01/29/2024 1529   UROBILINOGEN 0.2 03/29/2023 1412   NITRITE NEGATIVE 01/29/2024 1529   LEUKOCYTESUR NEGATIVE 01/29/2024 1529   Sepsis Labs: Invalid input(s): "PROCALCITONIN", "LACTICIDVEN"  Microbiology: Recent Results (from the past 240 hours)  Resp panel by RT-PCR (RSV, Flu A&B, Covid) Urine, Catheterized     Status: None   Collection Time: 01/29/24  3:29 PM   Specimen: Urine, Catheterized; Nasal Swab  Result Value Ref Range Status   SARS Coronavirus 2 by RT PCR NEGATIVE NEGATIVE Final   Influenza A by  PCR NEGATIVE NEGATIVE Final   Influenza B by PCR NEGATIVE NEGATIVE Final    Comment: (NOTE) The Xpert Xpress SARS-CoV-2/FLU/RSV plus assay is intended as an aid in the diagnosis of influenza from Nasopharyngeal swab specimens and should not be used as a sole basis for treatment. Nasal washings and aspirates are unacceptable for Xpert Xpress SARS-CoV-2/FLU/RSV testing.  Fact Sheet for Patients: BloggerCourse.com  Fact Sheet for Healthcare Providers: SeriousBroker.it  This test is not yet approved or cleared by the [place] FDA and has been authorized for detection and/or diagnosis of SARS-CoV-2 by FDA under an Emergency Use Authorization (EUA). This EUA will remain in effect (meaning this test can be used) for the duration of the COVID-19 declaration under Section 564(b)(1) of the Act, 21 U.S.C. section 360bbb-3(b)(1), unless the authorization is terminated or revoked.     Resp Syncytial Virus by PCR NEGATIVE NEGATIVE Final    Comment: (NOTE) Fact Sheet for Patients: BloggerCourse.com  Fact Sheet for Healthcare Providers: SeriousBroker.it  This test is not yet approved or cleared by the [place] FDA and has been authorized for detection and/or diagnosis of SARS-CoV-2 by FDA under an Emergency Use Authorization (EUA). This EUA will remain in effect (meaning this test can be used) for the duration of the COVID-19 declaration under Section 564(b)(1) of the Act, 21 U.S.C. section 360bbb-3(b)(1), unless the authorization is terminated or revoked.  Performed at [facility], [address], [place], [place] [address]   Blood Culture (routine x 2)     Status: None   Collection Time: 01/29/24  3:29 PM   Specimen: BLOOD  Result Value Ref Range Status   Specimen Description BLOOD LEFT ANTECUBITAL  Final   Special Requests   Final    BOTTLES DRAWN AEROBIC AND  ANAEROBIC Blood Culture results  may not be optimal due to an inadequate volume of blood received in culture bottles   Culture   Final    NO GROWTH 5 DAYS Performed at [facility], [address] [place], [place] [address]    Report Status 02/03/2024 FINAL  Final  MRSA Next Gen by PCR, Nasal     Status: None   Collection Time: 01/29/24  5:10 PM   Specimen: Pleural; Nasal Swab  Result Value Ref Range Status   MRSA by PCR Next Gen NOT DETECTED NOT DETECTED Final    Comment: (NOTE) The GeneXpert MRSA Assay (FDA approved for NASAL specimens only), is one component of a comprehensive MRSA colonization surveillance program. It is not intended to diagnose MRSA infection nor to guide or monitor treatment for MRSA infections. Test performance is not FDA approved in patients less than 94 years old. Performed at [facility], [address], [place], [place] [address]   Culture, Respiratory w Gram Stain     Status: None   Collection Time: 01/29/24  5:11 PM   Specimen: Tracheal Aspirate; Respiratory  Result Value Ref Range Status   Specimen Description TRACHEAL ASPIRATE  Final   Special Requests NONE  Final   Gram Stain   Final    FEW WBC PRESENT, PREDOMINANTLY PMN FEW SQUAMOUS EPITHELIAL CELLS PRESENT ABUNDANT GRAM POSITIVE COCCI FEW GRAM NEGATIVE RODS    Culture   Final    ABUNDANT Normal respiratory flora-no Staph aureus or Pseudomonas seen Performed at [facility], [address] [place], [place] [address]    Report Status 02/01/2024 FINAL  Final  Body fluid culture w Gram Stain     Status: None   Collection Time: 01/29/24  6:00 PM   Specimen: Pleural Fluid  Result Value Ref Range Status   Specimen Description PLEURAL  Final   Special Requests RIGHT  Final   Gram Stain NO WBC SEEN NO ORGANISMS SEEN   Final   Culture   Final    NO GROWTH 3 DAYS Performed at [facility], [address] [place], [place] [address]    Report Status 02/02/2024 FINAL   Final  Blood Culture (routine x 2)     Status: None   Collection Time: 01/29/24  7:45 PM   Specimen: BLOOD  Result Value Ref Range Status   Specimen Description BLOOD SITE NOT SPECIFIED  Final   Special Requests   Final    BOTTLES DRAWN AEROBIC AND ANAEROBIC Blood Culture adequate volume   Culture   Final    NO GROWTH 5 DAYS Performed at [facility], [address], [place], [place] [address]    Report Status 02/03/2024 FINAL  Final    Radiology Studies: No results found.    Leaman Abe T. Magen Suriano [facility]  If 7PM-7AM, please contact night-coverage [email or web address] 02/04/2024, 3:02 PM

## 2024-02-04 NOTE — Plan of Care (Signed)
  Problem: Clinical Measurements: Goal: Will remain free from infection Outcome: Progressing   Problem: Activity: Goal: Risk for activity intolerance will decrease Outcome: Progressing   Problem: Safety: Goal: Ability to remain free from injury will improve Outcome: Progressing   Problem: Skin Integrity: Goal: Risk for impaired skin integrity will decrease Outcome: Progressing   Problem: Skin Integrity: Goal: Risk for impaired skin integrity will decrease Outcome: Progressing   Problem: Tissue Perfusion: Goal: Adequacy of tissue perfusion will improve Outcome: Progressing

## 2024-02-05 DIAGNOSIS — I4892 Unspecified atrial flutter: Secondary | ICD-10-CM | POA: Diagnosis not present

## 2024-02-05 DIAGNOSIS — J42 Unspecified chronic bronchitis: Secondary | ICD-10-CM

## 2024-02-05 DIAGNOSIS — G4733 Obstructive sleep apnea (adult) (pediatric): Secondary | ICD-10-CM

## 2024-02-05 DIAGNOSIS — J9601 Acute respiratory failure with hypoxia: Secondary | ICD-10-CM | POA: Diagnosis not present

## 2024-02-05 DIAGNOSIS — E1151 Type 2 diabetes mellitus with diabetic peripheral angiopathy without gangrene: Secondary | ICD-10-CM

## 2024-02-05 DIAGNOSIS — G4739 Other sleep apnea: Secondary | ICD-10-CM

## 2024-02-05 DIAGNOSIS — I739 Peripheral vascular disease, unspecified: Secondary | ICD-10-CM

## 2024-02-05 LAB — RENAL FUNCTION PANEL
Albumin: 2.8 g/dL — ABNORMAL LOW (ref 3.5–5.0)
Anion gap: 10 (ref 5–15)
BUN: 17 mg/dL (ref 8–23)
CO2: 27 mmol/L (ref 22–32)
Calcium: 9 mg/dL (ref 8.9–10.3)
Chloride: 103 mmol/L (ref 98–111)
Creatinine, Ser: 1.13 mg/dL (ref 0.61–1.24)
GFR, Estimated: >60 mL/min (ref >60)
Glucose, Bld: 139 mg/dL — ABNORMAL HIGH (ref 70–99)
Phosphorus: 3.5 mg/dL (ref 2.5–4.6)
Potassium: 3.7 mmol/L (ref 3.5–5.1)
Sodium: 140 mmol/L (ref 135–145)

## 2024-02-05 LAB — LIPID PANEL
Cholesterol: 145 mg/dL (ref 0–200)
HDL: 30 mg/dL — ABNORMAL LOW (ref >40)
LDL Cholesterol: 102 mg/dL — ABNORMAL HIGH (ref 0–99)
Total CHOL/HDL Ratio: 4.8 ratio
Triglycerides: 64 mg/dL (ref <150)
VLDL: 13 mg/dL (ref 0–40)

## 2024-02-05 LAB — GLUCOSE, CAPILLARY
Glucose-Capillary: 172 mg/dL — ABNORMAL HIGH (ref 70–99)
Glucose-Capillary: 196 mg/dL — ABNORMAL HIGH (ref 70–99)
Glucose-Capillary: 171 mg/dL — ABNORMAL HIGH (ref 70–99)
Glucose-Capillary: 155 mg/dL — ABNORMAL HIGH (ref 70–99)

## 2024-02-05 LAB — MAGNESIUM: Magnesium: 2 mg/dL (ref 1.7–2.4)

## 2024-02-05 MED ORDER — FUROSEMIDE 40 MG PO TABS
40.0000 mg | ORAL_TABLET | Freq: Every day | ORAL | Status: DC
  Administered 2024-02-05 – 2024-02-09 (×5): 40 mg via ORAL
  Filled 2024-02-05 (×5): qty 1

## 2024-02-05 MED ORDER — MOMETASONE FURO-FORMOTEROL FUM 200-5 MCG/ACT IN AERO
2.0000 | INHALATION_SPRAY | Freq: Two times a day (BID) | RESPIRATORY_TRACT | Status: DC
  Administered 2024-02-05 – 2024-02-09 (×10): 2 via RESPIRATORY_TRACT
  Filled 2024-02-05: qty 8.8

## 2024-02-05 MED ORDER — GUAIFENESIN ER 600 MG PO TB12
600.0000 mg | ORAL_TABLET | Freq: Two times a day (BID) | ORAL | Status: DC
  Administered 2024-02-05 – 2024-02-09 (×10): 600 mg via ORAL
  Filled 2024-02-05 (×9): qty 1

## 2024-02-05 MED ORDER — IPRATROPIUM-ALBUTEROL 0.5-2.5 (3) MG/3ML IN SOLN: 3.0000 mL | RESPIRATORY_TRACT | Status: DC | PRN

## 2024-02-05 NOTE — Progress Notes (Signed)
   02/05/24 2035  BiPAP/CPAP/SIPAP  BiPAP/CPAP/SIPAP Pt Type Adult  BiPAP/CPAP/SIPAP Resmed  Mask Type  (pt stated he doesnt want to wear CPAP for the night)  BiPAP/CPAP /SiPAP Vitals  Pulse Rate 86  Resp 20  SpO2 96 %  Bilateral Breath Sounds Clear;Diminished

## 2024-02-05 NOTE — Plan of Care (Signed)
  Problem: Clinical Measurements: Goal: Will remain free from infection Outcome: Progressing Goal: Respiratory complications will improve Outcome: Progressing   Problem: Activity: Goal: Risk for activity intolerance will decrease Outcome: Progressing   Problem: Elimination: Goal: Will not experience complications related to bowel motility Outcome: Progressing Goal: Will not experience complications related to urinary retention Outcome: Progressing   Problem: Pain Managment: Goal: General experience of comfort will improve and/or be controlled Outcome: Progressing   Problem: Safety: Goal: Ability to remain free from injury will improve Outcome: Progressing   Problem: Skin Integrity: Goal: Risk for impaired skin integrity will decrease Outcome: Progressing   Problem: Health Behavior/Discharge Planning: Goal: Ability to manage health-related needs will improve Outcome: Progressing   Problem: Skin Integrity: Goal: Risk for impaired skin integrity will decrease Outcome: Progressing   Problem: Tissue Perfusion: Goal: Adequacy of tissue perfusion will improve Outcome: Progressing

## 2024-02-05 NOTE — Progress Notes (Signed)
 PROGRESS NOTE  Jason [name] ZOX:[id number] DOB: 11-03-1941   PCP: [name], MD  Patient is from: Home.  Lives with wife who has dementia.  Has caregiver.   DOA: 01/29/2024 LOS: 7  Chief complaints Chief Complaint  Patient presents with   Respiratory Distress   Altered Mental Status     Brief Narrative / Interim history: 83 year old M with PMH of diastolic CHF, sev mitral valve stenosis, OSA, atrial flutter on Xarelto, DM-2, HTN, HLD, PAD and chronic pain brought to ED due to altered mental status and respiratory distress, and admitted to ICU with acute on chronic respiratory failure with hypoxia and hypercapnia, CHF exacerbation and pleural effusion.  Reportedly, patient was hypoxic to 60s with witnessed LOC when first responders arrived and had brief CPR.  However, found to have strong pulses when EMS arrived.  In ED, hypothermic, tachypneic with mild hypertension. VBG 7.1/95/75/27 suggesting acute respiratory acidosis.  K3.4. Na 133.  Cr 1.4.  BUN 24.  Hgb 11.  BNP 1360.  Troponin 40.  Lactic acid 3.8.  CXR with small to moderate layering right pleural effusion.  Started on broad-spectrum antibiotics.  Intubated and admitted to ICU.    While in ICU, he had chest tube placement for pleural effusion.  Diuresed with IV Lasix.  TTE with LVEF of 55 to 60%, moderate concentric LVH, D-shaped septum with moderately reduced RVSF, moderate LAE and RAE and severe mitral valve stenosis (not new).   Patient was extubated on 3/19 and transferred to [facility] on 3/21 on room air.  Therapy recommending SNF.  Medically stable for discharge.  Subjective: Seen and examined earlier this morning.  No major events overnight of this morning.  Reported some cough.  No shortness of breath  Objective: Vitals:   02/04/24 2350 02/05/24 0452 02/05/24 0834 02/05/24 1213  BP: (!) 129/53 (!) 126/58 (!) 120/56 (!) 102/55  Pulse: 61 72 (!) 56 61  Resp: 19 19 18 19   Temp: 99 F (37.2 C) 98.8 F  (37.1 C)  98.7 F (37.1 C)  TempSrc: Oral Oral Oral   SpO2: 95% 93% 94% 91%  Weight:  131.3 kg    Height:        Examination:  GENERAL: No apparent distress.  Nontoxic. HEENT: MMM.  Vision and hearing grossly intact.  NECK: Supple.  No apparent JVD.  RESP:  No IWOB.  Fair aeration bilaterally but limited exam CVS:  RRR. Heart sounds normal.  ABD/GI/GU: BS+. Abd soft, NTND.  MSK/EXT:  Moves extremities.  Right TMA.  BLE edema/venous insufficiency.  Not able to palpate DP pulses.  SKIN: Lower extremity skin changes in keeping with venous sufficiency/chronic PAD. NEURO: Awake, alert and oriented x 4.  No apparent focal neuro deficit. PSYCH: Calm. Normal affect.   Consultants:  Critical care admitted patient  Procedures: Intubation and mechanical ventilation Right chest tube   Microbiology summarized: COVID-19, influenza and RSV PCR nonreactive MRSA PCR screen nonreactive Blood cultures NGTD Respiratory culture normal respiratory flora Right pleural fluid culture NGTD  Assessment and plan: Acute hypoxic and hypercapnic respiratory failure: Present on arrival.  Multifactorial including CHF, underlying COPD, OSA, polypharmacy, encephalopathy and pleural effusion.  Resolved. -Treat treatable causes -Minimum oxygen to keep saturation above 90% -Minimize sedating medications. -Start Dulera twice daily, DuoNebs and mucolytic's as needed.  Acute on chronic biventricular heart failure: TTE with LVEF of 55 to 60%, moderate concentric LVH, D-shaped septum with moderately reduced RVSF, moderate LAE and RAE and severe mitral valve  stenosis (not new).  BNP elevated to 1360.  CXR with mild to moderate right pleural effusion.  Diuresed with IV Lasix.  S/p chest tube for right pleural effusion. -Resume p.o. Lasix 40 mg daily -Started Farxiga 10 g daily on 3/22. -Strict intake and output, daily weight, renal functions and electrolytes  Right pleural effusion: Chest x-ray showed mild to  moderate pleural effusion.  Likely due to CHF. -S/p chest tube and diuretics.  Fluid transudative and culture negative. -Manage CHF as above  Untreated OSA: Patient and family does not recall diagnosis of OSA but his sleep study in 2017 was positive.  Not able to access his spirometry result at the same time.  Not on CPAP or inhalers at home. -Refused trial of CPAP here.  IDDM-2 with hyperglycemia: A1c 7.2%. Recent Labs  Lab 02/04/24 1136 02/04/24 1627 02/04/24 2040 02/05/24 0839 02/05/24 1208  GLUCAP 176* 202* 186* 172* 196*  -Continue SSI-sensitive -Added Farxiga as above  AKI: Baseline Cr ~0.9-1.0.  AKI resolved. Recent Labs    05/18/23 1332 01/29/24 1500 01/29/24 1515 01/30/24 0350 01/31/24 0408 02/01/24 0315 02/02/24 0359 02/03/24 0602 02/04/24 0656 02/05/24 0737  BUN 20 25* 24* 20 21 18 17 22 20 17   CREATININE 1.18 1.48* 1.40* 1.22 1.23 1.15 1.11 1.35* 1.12 1.13  -Hold diuretics today -Continue monitoring  Normocytic anemia: Likely due to chronic disease and acute illness.  No overt bleeding.  He is on Xarelto.  H&H stable now. Recent Labs    01/29/24 1515 01/29/24 1550 01/29/24 2117 01/30/24 0350 01/31/24 0000 01/31/24 0408 02/01/24 0315 02/02/24 0359 02/03/24 0602 02/04/24 0656  HGB 12.9*  12.9* 10.9* 9.6* 9.9* 9.5* 9.3* 9.0* 8.4* 8.2* 8.8*  -Continue monitoring -Check anemia panel in the morning  Atrial flutter anticoagulated with Xarelto at baseline -Continue home Xarelto. -Toprol-XL on hold due to borderline bradycardia. -Optimize electrolytes  Bilateral PAD/possible cellulitis: ABI in 03/2023 with noncompressible lower extremity arthritis bilaterally.  He seems to have chronic venous insufficiency as well.  He is on Xarelto.  He is LDL is 114 in 03/2023.  He is not on a statin. -Recheck lipid panel -Continue doxycycline and ceftriaxone until 3/24. -Continue Xarelto -Hesitant to add low-dose aspirin given his anemia.  Chronic pain on  opiate-Per narcotic database, he had prescription for oxycodone in February and tramadol and December.  Denies current use.   Currently not in significant pain or discomfort. -Tylenol as needed for pain -Avoid opiates.  Hyponatremia: Resolved  Physical deconditioning: Daughter reports significant physical deconditioning lately.  -PT/OT recommended SNF.   Class I obesity Body mass index is 34.33 kg/m. Nutrition Problem: Inadequate oral intake Etiology: inability to eat Signs/Symptoms: NPO status Interventions: Refer to RD note for recommendations   DVT prophylaxis:  rivaroxaban (XARELTO) tablet 20 mg Start: 01/31/24 1700 rivaroxaban (XARELTO) tablet 20 mg  Code Status: Full code Family Communication: None at bedside Level of care: Telemetry Medical Status is: Inpatient Remains inpatient appropriate because: Respiratory failure, CHF exacerbation and physical deconditioning   Final disposition: SNF.  Medically stable for discharge.   35 minutes with more than 50% spent in reviewing records, counseling patient/family and coordinating care.   Sch Meds:  Scheduled Meds:  dapagliflozin propanediol  10 mg Oral Daily   doxycycline  100 mg Oral Q12H   feeding supplement  237 mL Oral Q24H   furosemide  40 mg Oral Daily   guaiFENesin  600 mg Oral BID   insulin aspart  0-5 Units Subcutaneous QHS  insulin aspart  0-9 Units Subcutaneous TID WC   mometasone-formoterol  2 puff Inhalation BID   polyethylene glycol  17 g Oral BID   rivaroxaban  20 mg Oral Q supper   senna-docusate  1 tablet Oral BID   Continuous Infusions:   PRN Meds:.docusate sodium, ipratropium-albuterol, mouth rinse, polyethylene glycol  Antimicrobials: Anti-infectives (From admission, onward)    Start     Dose/Rate Route Frequency Ordered Stop   02/02/24 1000  doxycycline (VIBRA-TABS) tablet 100 mg        100 mg Oral Every 12 hours 02/01/24 2114 02/06/24 0959   01/31/24 2200  doxycycline (VIBRA-TABS)  tablet 100 mg  Status:  Discontinued        100 mg Per Tube Every 12 hours 01/31/24 1452 02/01/24 2114   01/30/24 1000  cefTRIAXone (ROCEPHIN) 2 g in sodium chloride 0.9 % 100 mL IVPB        2 g 200 mL/hr over 30 Minutes Intravenous Every 24 hours 01/29/24 1659 02/05/24 1010   01/30/24 1000  doxycycline (VIBRAMYCIN) 100 mg in sodium chloride 0.9 % 250 mL IVPB  Status:  Discontinued        100 mg 125 mL/hr over 120 Minutes Intravenous Every 12 hours 01/29/24 1659 01/31/24 1452   01/29/24 1715  linezolid (ZYVOX) IVPB 600 mg  Status:  Discontinued        600 mg 300 mL/hr over 60 Minutes Intravenous Every 12 hours 01/29/24 1704 01/30/24 1106   01/29/24 1530  cefTRIAXone (ROCEPHIN) 2 g in sodium chloride 0.9 % 100 mL IVPB        2 g 200 mL/hr over 30 Minutes Intravenous Once 01/29/24 1529 01/29/24 1728   01/29/24 1530  azithromycin (ZITHROMAX) 500 mg in sodium chloride 0.9 % 250 mL IVPB        500 mg 250 mL/hr over 60 Minutes Intravenous  Once 01/29/24 1529 01/29/24 1738        I have personally reviewed the following labs and images: CBC: Recent Labs  Lab 01/29/24 2117 01/30/24 0350 01/31/24 0408 02/01/24 0315 02/02/24 0359 02/03/24 0602 02/04/24 0656  WBC 8.6   < > 9.8 8.9 8.2 5.4 4.6  NEUTROABS 6.9  --   --   --   --   --   --   HGB 9.6*   < > 9.3* 9.0* 8.4* 8.2* 8.8*  HCT 31.5*   < > 29.8* 28.9* 28.0* 27.4* 28.8*  MCV 86.1   < > 83.2 84.3 87.8 87.5 87.0  PLT 241   < > 264 243 221 230 228   < > = values in this interval not displayed.   BMP &GFR Recent Labs  Lab 02/01/24 0315 02/02/24 0359 02/03/24 0602 02/04/24 0656 02/05/24 0737  NA 136 139 139 140 140  K 3.2* 3.9 3.9 3.6 3.7  CL 96* 99 97* 100 103  CO2 29 26 28 29 27   GLUCOSE 227* 150* 152* 169* 139*  BUN 18 17 22 20 17   CREATININE 1.15 1.11 1.35* 1.12 1.13  CALCIUM 8.0* 8.5* 8.8* 8.7* 9.0  MG 2.1 2.0 2.1 2.1 2.0  PHOS 2.6 3.2 3.4 3.2 3.5   Estimated Creatinine Clearance: 76.9 mL/min (by C-G formula based  on SCr of 1.13 mg/dL). Liver & Pancreas: Recent Labs  Lab 01/29/24 1500 02/04/24 0656 02/05/24 0737  AST 49*  --   --   ALT 39  --   --   ALKPHOS 48  --   --  BILITOT 1.0  --   --   PROT 7.3  --   --   ALBUMIN 3.3* 2.9* 2.8*   No results for input(s): "LIPASE", "AMYLASE" in the last 168 hours. No results for input(s): "AMMONIA" in the last 168 hours. Diabetic: No results for input(s): "HGBA1C" in the last 72 hours. Recent Labs  Lab 02/04/24 1136 02/04/24 1627 02/04/24 2040 02/05/24 0839 02/05/24 1208  GLUCAP 176* 202* 186* 172* 196*   Cardiac Enzymes: No results for input(s): "CKTOTAL", "CKMB", "CKMBINDEX", "TROPONINI" in the last 168 hours. No results for input(s): "PROBNP" in the last 8760 hours. Coagulation Profile: Recent Labs  Lab 01/29/24 2117  INR 1.8*   Thyroid Function Tests: No results for input(s): "TSH", "T4TOTAL", "FREET4", "T3FREE", "THYROIDAB" in the last 72 hours. Lipid Profile: Recent Labs    02/05/24 0737  CHOL 145  HDL 30*  LDLCALC 102*  TRIG 64  CHOLHDL 4.8   Anemia Panel: No results for input(s): "VITAMINB12", "FOLATE", "FERRITIN", "TIBC", "IRON", "RETICCTPCT" in the last 72 hours. Urine analysis:    Component Value Date/Time   COLORURINE AMBER (A) 01/29/2024 1529   APPEARANCEUR HAZY (A) 01/29/2024 1529   LABSPEC 1.019 01/29/2024 1529   PHURINE 5.0 01/29/2024 1529   GLUCOSEU NEGATIVE 01/29/2024 1529   GLUCOSEU >=1000 (A) 03/29/2023 1412   HGBUR NEGATIVE 01/29/2024 1529   BILIRUBINUR NEGATIVE 01/29/2024 1529   BILIRUBINUR neg 03/21/2018 0940   KETONESUR NEGATIVE 01/29/2024 1529   PROTEINUR 100 (A) 01/29/2024 1529   UROBILINOGEN 0.2 03/29/2023 1412   NITRITE NEGATIVE 01/29/2024 1529   LEUKOCYTESUR NEGATIVE 01/29/2024 1529   Sepsis Labs: Invalid input(s): "PROCALCITONIN", "LACTICIDVEN"  Microbiology: Recent Results (from the past 240 hours)  Resp panel by RT-PCR (RSV, Flu A&B, Covid) Urine, Catheterized     Status: None    Collection Time: 01/29/24  3:29 PM   Specimen: Urine, Catheterized; Nasal Swab  Result Value Ref Range Status   SARS Coronavirus 2 by RT PCR NEGATIVE NEGATIVE Final   Influenza A by PCR NEGATIVE NEGATIVE Final   Influenza B by PCR NEGATIVE NEGATIVE Final    Comment: (NOTE) The Xpert Xpress SARS-CoV-2/FLU/RSV plus assay is intended as an aid in the diagnosis of influenza from Nasopharyngeal swab specimens and should not be used as a sole basis for treatment. Nasal washings and aspirates are unacceptable for Xpert Xpress SARS-CoV-2/FLU/RSV testing.  Fact Sheet for Patients: BloggerCourse.com  Fact Sheet for Healthcare Providers: SeriousBroker.it  This test is not yet approved or cleared by the [place] FDA and has been authorized for detection and/or diagnosis of SARS-CoV-2 by FDA under an Emergency Use Authorization (EUA). This EUA will remain in effect (meaning this test can be used) for the duration of the COVID-19 declaration under Section 564(b)(1) of the Act, 21 U.S.C. section 360bbb-3(b)(1), unless the authorization is terminated or revoked.     Resp Syncytial Virus by PCR NEGATIVE NEGATIVE Final    Comment: (NOTE) Fact Sheet for Patients: BloggerCourse.com  Fact Sheet for Healthcare Providers: SeriousBroker.it  This test is not yet approved or cleared by the [place] FDA and has been authorized for detection and/or diagnosis of SARS-CoV-2 by FDA under an Emergency Use Authorization (EUA). This EUA will remain in effect (meaning this test can be used) for the duration of the COVID-19 declaration under Section 564(b)(1) of the Act, 21 U.S.C. section 360bbb-3(b)(1), unless the authorization is terminated or revoked.  Performed at [facility], [address], [place], [place] [address]   Blood  Culture (routine x 2)     Status: None   Collection Time:  01/29/24  3:29 PM   Specimen: BLOOD  Result Value Ref Range Status   Specimen Description BLOOD LEFT ANTECUBITAL  Final   Special Requests   Final    BOTTLES DRAWN AEROBIC AND ANAEROBIC Blood Culture results may not be optimal due to an inadequate volume of blood received in culture bottles   Culture   Final    NO GROWTH 5 DAYS Performed at [facility], [address], [place], [place] [address]    Report Status 02/03/2024 FINAL  Final  MRSA Next Gen by PCR, Nasal     Status: None   Collection Time: 01/29/24  5:10 PM   Specimen: Pleural; Nasal Swab  Result Value Ref Range Status   MRSA by PCR Next Gen NOT DETECTED NOT DETECTED Final    Comment: (NOTE) The GeneXpert MRSA Assay (FDA approved for NASAL specimens only), is one component of a comprehensive MRSA colonization surveillance program. It is not intended to diagnose MRSA infection nor to guide or monitor treatment for MRSA infections. Test performance is not FDA approved in patients less than 56 years old. Performed at [facility], [address] [place], [place] [address]   Culture, Respiratory w Gram Stain     Status: None   Collection Time: 01/29/24  5:11 PM   Specimen: Tracheal Aspirate; Respiratory  Result Value Ref Range Status   Specimen Description TRACHEAL ASPIRATE  Final   Special Requests NONE  Final   Gram Stain   Final    FEW WBC PRESENT, PREDOMINANTLY PMN FEW SQUAMOUS EPITHELIAL CELLS PRESENT ABUNDANT GRAM POSITIVE COCCI FEW GRAM NEGATIVE RODS    Culture   Final    ABUNDANT Normal respiratory flora-no Staph aureus or Pseudomonas seen Performed at [facility], [address] [place], [place] [address]    Report Status 02/01/2024 FINAL  Final  Body fluid culture w Gram Stain     Status: None   Collection Time: 01/29/24  6:00 PM   Specimen: Pleural Fluid  Result Value Ref Range Status   Specimen Description PLEURAL  Final   Special Requests RIGHT  Final   Gram Stain NO WBC  SEEN NO ORGANISMS SEEN   Final   Culture   Final    NO GROWTH 3 DAYS Performed at [facility], [address], [place], [place] [address]    Report Status 02/02/2024 FINAL  Final  Blood Culture (routine x 2)     Status: None   Collection Time: 01/29/24  7:45 PM   Specimen: BLOOD  Result Value Ref Range Status   Specimen Description BLOOD SITE NOT SPECIFIED  Final   Special Requests   Final    BOTTLES DRAWN AEROBIC AND ANAEROBIC Blood Culture adequate volume   Culture   Final    NO GROWTH 5 DAYS Performed at [facility], [address] [place], [place] [address]    Report Status 02/03/2024 FINAL  Final    Radiology Studies: No results found.    Anatalia Kronk T. Milliana Reddoch [facility]  If 7PM-7AM, please contact night-coverage [email or web address] 02/05/2024, 2:35 PM

## 2024-02-06 DIAGNOSIS — I70209 Unspecified atherosclerosis of native arteries of extremities, unspecified extremity: Secondary | ICD-10-CM

## 2024-02-06 DIAGNOSIS — E1151 Type 2 diabetes mellitus with diabetic peripheral angiopathy without gangrene: Secondary | ICD-10-CM | POA: Diagnosis not present

## 2024-02-06 DIAGNOSIS — J9601 Acute respiratory failure with hypoxia: Secondary | ICD-10-CM | POA: Diagnosis not present

## 2024-02-06 DIAGNOSIS — J42 Unspecified chronic bronchitis: Secondary | ICD-10-CM | POA: Diagnosis not present

## 2024-02-06 DIAGNOSIS — I4892 Unspecified atrial flutter: Secondary | ICD-10-CM | POA: Diagnosis not present

## 2024-02-06 LAB — MAGNESIUM: Magnesium: 2.2 mg/dL (ref 1.7–2.4)

## 2024-02-06 LAB — RETICULOCYTES
Immature Retic Fract: 26.9 % — ABNORMAL HIGH (ref 2.3–15.9)
RBC.: 3.79 MIL/uL — ABNORMAL LOW (ref 4.22–5.81)
Retic Count, Absolute: 119.4 10*3/uL (ref 19.0–186.0)
Retic Ct Pct: 3.2 % — ABNORMAL HIGH (ref 0.4–3.1)

## 2024-02-06 LAB — CBC
HCT: 33.6 % — ABNORMAL LOW (ref 39.0–52.0)
Hemoglobin: 10.1 g/dL — ABNORMAL LOW (ref 13.0–17.0)
MCH: 26.9 pg (ref 26.0–34.0)
MCHC: 30.1 g/dL (ref 30.0–36.0)
MCV: 89.6 fL (ref 80.0–100.0)
Platelets: 327 10*3/uL (ref 150–400)
RBC: 3.75 MIL/uL — ABNORMAL LOW (ref 4.22–5.81)
RDW: 17.8 % — ABNORMAL HIGH (ref 11.5–15.5)
WBC: 5.3 10*3/uL (ref 4.0–10.5)
nRBC: 0 % (ref 0.0–0.2)

## 2024-02-06 LAB — IRON AND TIBC
Iron: 27 ug/dL — ABNORMAL LOW (ref 45–182)
Saturation Ratios: 7 % — ABNORMAL LOW (ref 17.9–39.5)
TIBC: 412 ug/dL (ref 250–450)
UIBC: 385 ug/dL

## 2024-02-06 LAB — RENAL FUNCTION PANEL
Albumin: 3.4 g/dL — ABNORMAL LOW (ref 3.5–5.0)
Anion gap: 10 (ref 5–15)
BUN: 15 mg/dL (ref 8–23)
CO2: 27 mmol/L (ref 22–32)
Calcium: 9.3 mg/dL (ref 8.9–10.3)
Chloride: 102 mmol/L (ref 98–111)
Creatinine, Ser: 1.18 mg/dL (ref 0.61–1.24)
GFR, Estimated: >60 mL/min (ref >60)
Glucose, Bld: 193 mg/dL — ABNORMAL HIGH (ref 70–99)
Phosphorus: 4 mg/dL (ref 2.5–4.6)
Potassium: 3.6 mmol/L (ref 3.5–5.1)
Sodium: 139 mmol/L (ref 135–145)

## 2024-02-06 LAB — FERRITIN: Ferritin: 62 ng/mL (ref 24–336)

## 2024-02-06 LAB — VITAMIN B12: Vitamin B-12: 783 pg/mL (ref 180–914)

## 2024-02-06 LAB — GLUCOSE, CAPILLARY
Glucose-Capillary: 156 mg/dL — ABNORMAL HIGH (ref 70–99)
Glucose-Capillary: 196 mg/dL — ABNORMAL HIGH (ref 70–99)
Glucose-Capillary: 197 mg/dL — ABNORMAL HIGH (ref 70–99)
Glucose-Capillary: 281 mg/dL — ABNORMAL HIGH (ref 70–99)

## 2024-02-06 LAB — FOLATE: Folate: 21.9 ng/mL (ref >5.9)

## 2024-02-06 MED ORDER — IRON SUCROSE 300 MG IVPB - SIMPLE MED
300.0000 mg | Freq: Once | Status: DC
  Filled 2024-02-06: qty 265

## 2024-02-06 MED ORDER — ATORVASTATIN CALCIUM 40 MG PO TABS
40.0000 mg | ORAL_TABLET | Freq: Every day | ORAL | Status: DC
  Administered 2024-02-06 – 2024-02-09 (×4): 40 mg via ORAL
  Filled 2024-02-06 (×4): qty 1

## 2024-02-06 MED ORDER — SODIUM CHLORIDE 0.9 % IV SOLN
300.0000 mg | Freq: Once | INTRAVENOUS | Status: AC
  Administered 2024-02-06: 300 mg via INTRAVENOUS
  Filled 2024-02-06: qty 15

## 2024-02-06 NOTE — Plan of Care (Signed)
 ?  Problem: Education: ?Goal: Knowledge of General Education information will improve ?Description: Including pain rating scale, medication(s)/side effects and non-pharmacologic comfort measures ?Outcome: Progressing ?  ?Problem: Health Behavior/Discharge Planning: ?Goal: Ability to manage health-related needs will improve ?Outcome: Progressing ?  ?Problem: Coping: ?Goal: Level of anxiety will decrease ?Outcome: Progressing ?  ?

## 2024-02-06 NOTE — TOC Progression Note (Addendum)
 Transition of Care [facility]) - Progression Note    Patient Details  Name: Jason [name] MRN: [id number] Date of Birth: 1941-03-17  [facility]) CM/SW Contact  Erin Sons, [place] Phone Number: 02/06/2024, 12:12 PM  Clinical Narrative:      [facility]: left VM for admissions.   - [facility]: Left VM for admissions  - [facility]: left message with Special educational needs teacher for admissions to call back   - [facility]: left voicemail with admissions   - [facility]:  they may be able to offer bed; they are calling daughter to discuss plan for after rehab.   Pt is ox4 now. Met with pt and provided local snf offers; Currently no offers in [place] area. Pt states he would need to talk with daughter about offers. CSW to follow up with daughter.  1240: CSW called daughter and updated her. She is interested in [facility] and plans to tour their facility today. She will call CSW after tour. She wants to private pay for ambulance transport on [place].   Expected Discharge Plan: Skilled Nursing Facility Barriers to Discharge: Continued Medical Work up, SNF Pending bed offer  Expected Discharge Plan and Services In-house Referral: Clinical Social Work   Post Acute Care Choice: Skilled Nursing Facility Living arrangements for the past 2 months: Single Family Home                                       Social Determinants of Health (SDOH) Interventions SDOH Screenings   Food Insecurity: No Food Insecurity (04/26/2023)  Housing: Low Risk  (02/01/2024)  Transportation Needs: Unmet Transportation Needs (02/01/2024)  Utilities: Not At Risk (02/01/2024)  Alcohol Screen: Low Risk  (06/01/2022)  Depression (PHQ2-9): Low Risk  (06/01/2022)  Financial Resource Strain: Low Risk  (06/01/2022)  Physical Activity: Sufficiently Active (06/01/2022)  Social Connections: Socially Isolated (02/01/2024)  Stress: Stress Concern Present (06/01/2022)  Tobacco  Use: Medium Risk (01/29/2024)    Readmission Risk Interventions    04/30/2023   12:25 PM  Readmission Risk Prevention Plan  Transportation Screening Complete  PCP or Specialist Appt within 3-5 Days Complete  HRI or Home Care Consult Complete  Social Work Consult for Recovery Care Planning/Counseling Complete  Palliative Care Screening Complete  Medication Review [employer]) Complete

## 2024-02-06 NOTE — Progress Notes (Signed)
 Physical Therapy Treatment [name] MRN: [id number] DOB: 08/25/1941 Today's Date: 02/06/2024   History of Present Illness 83 yo male admitted 3/16 with acute respiratory distress, AMS, hypoxic, Acutely decompensated HFpEF. Intubated 3/16-3/19. PMhx: COPD, HTN, HLD, HFpEF, sleep apnea, T2DM, AFib, PAD, Rt transmet amputation    PT Comments  Pt pleasant and very willing to mobilize. Pt able to progress to short distance ambulation in room but limited by fatigue. [place] continues to require 2 person assist to rise particularly from lower surfaces and reliant on RW. Therapist provided education for HEP and transfers. Will continue to follow.     If plan is discharge home, recommend the following: A lot of help with walking and/or transfers;A lot of help with bathing/dressing/bathroom;Help with stairs or ramp for entrance;Assist for transportation   Can travel by private vehicle     No  Equipment Recommendations  BSC/3in1    Recommendations for Other Services       Precautions / Restrictions Precautions Precautions: Fall     Mobility  Bed Mobility Overal bed mobility: Needs Assistance Bed Mobility: Supine to Sit     Supine to sit: Min assist, HOB elevated, Used rails     General bed mobility comments: min assist to clear legs off surface, pt able to elevate trunk with use of rail, increased time and assist to scoot hips to EOB    Transfers Overall transfer level: Needs assistance   Transfers: Sit to/from Stand Sit to Stand: Mod assist, +2 physical assistance, Min assist   Step pivot transfers: Mod assist, +2 safety/equipment, Min assist       General transfer comment: mod +2 assist to stand from bed, min +2 assist to stand from recliner boosted with folded blankets. Cues for hand placement and safety with assist to rise    Ambulation/Gait Ambulation/Gait assistance: Contact guard assist, +2 safety/equipment Gait Distance (Feet): 20  Feet Assistive device: Rolling walker (2 wheels) Gait Pattern/deviations: Step-through pattern, Decreased stride length   Gait velocity interpretation: <1.8 ft/sec, indicate of risk for recurrent falls   General Gait Details: cues for posture and proximity to RW with close chair follow   Stairs             Wheelchair Mobility     Tilt Bed    Modified Rankin (Stroke Patients Only)       Balance Overall balance assessment: Needs assistance, History of Falls Sitting-balance support: No upper extremity supported, Feet supported Sitting balance-Leahy Scale: Fair Sitting balance - Comments: EOB without support   Standing balance support: Bilateral upper extremity supported, During functional activity, Reliant on assistive device for balance Standing balance-Leahy Scale: Poor Standing balance comment: Rw in standing                            Communication Communication Communication: No apparent difficulties  Cognition Arousal: Alert Behavior During Therapy: WFL for tasks assessed/performed   PT - Cognitive impairments: Safety/Judgement, Problem solving                         Following commands: Intact Following commands impaired: Follows one step commands with increased time    Cueing Cueing Techniques: Verbal cues, Tactile cues  Exercises General Exercises - Lower Extremity Long Arc Quad: AROM, Both, 20 reps, Seated, Strengthening Hip Flexion/Marching: AROM, Both, 10 reps, Seated, Strengthening    General Comments  Pertinent Vitals/Pain Pain Assessment Pain Assessment: No/denies pain    Home Living                          Prior Function            PT Goals (current goals can now be found in the care plan section) Progress towards PT goals: Progressing toward goals    Frequency    Min 2X/week      PT Plan      Co-evaluation              AM-PAC PT "6 Clicks" Mobility   Outcome Measure  Help  needed turning from your back to your side while in a flat bed without using bedrails?: A Little Help needed moving from lying on your back to sitting on the side of a flat bed without using bedrails?: A Little Help needed moving to and from a bed to a chair (including a wheelchair)?: A Lot Help needed standing up from a chair using your arms (e.g., wheelchair or bedside chair)?: Total Help needed to walk in hospital room?: Total Help needed climbing 3-5 steps with a railing? : Total 6 Click Score: 11    End of Session Equipment Utilized During Treatment: Gait belt Activity Tolerance: Patient tolerated treatment well Patient left: in chair;with call bell/phone within reach;with chair alarm set Nurse Communication: Mobility status PT Visit Diagnosis: Other abnormalities of gait and mobility (R26.89);Difficulty in walking, not elsewhere classified (R26.2);Muscle weakness (generalized) (M62.81)     Time: 4098-1191 PT Time Calculation (min) (ACUTE ONLY): 28 min  Charges:    $Gait Training: 8-22 mins $Therapeutic Activity: 8-22 mins PT General Charges $$ ACUTE PT VISIT: 1 Visit                     Merryl Hacker, PT Acute Rehabilitation Services Office: [phone]    Enedina Finner Monique Gift 02/06/2024, 10:40 AM

## 2024-02-06 NOTE — Progress Notes (Signed)
 PROGRESS NOTE  Jason [name] ZOX:[id number] DOB: [birth year]   PCP: [name], MD  Patient is from: Home.  Lives with wife who has dementia.  Has caregiver.   DOA: 01/29/2024 LOS: 8  Chief complaints Chief Complaint  Patient presents with   Respiratory Distress   Altered Mental Status     Brief Narrative / Interim history: 83 year old M with PMH of diastolic CHF, sev mitral valve stenosis, OSA, atrial flutter on Xarelto, DM-2, HTN, HLD, PAD and chronic pain brought to ED due to altered mental status and respiratory distress, and admitted to ICU with acute on chronic respiratory failure with hypoxia and hypercapnia, CHF exacerbation and pleural effusion.  Reportedly, patient was hypoxic to 60s with witnessed LOC when first responders arrived and had brief CPR.  However, found to have strong pulses when EMS arrived.  In ED, hypothermic, tachypneic with mild hypertension. VBG 7.1/95/75/27 suggesting acute respiratory acidosis.  K3.4. Na 133.  Cr 1.4.  BUN 24.  Hgb 11.  BNP 1360.  Troponin 40.  Lactic acid 3.8.  CXR with small to moderate layering right pleural effusion.  Started on broad-spectrum antibiotics.  Intubated and admitted to ICU.    While in ICU, he had chest tube placement for pleural effusion.  Diuresed with IV Lasix.  TTE with LVEF of 55 to 60%, moderate concentric LVH, D-shaped septum with moderately reduced RVSF, moderate LAE and RAE and severe mitral valve stenosis (not new).   Patient was extubated on 3/19 and transferred to [facility] on 3/21 on room air.  Therapy recommending SNF.  Medically stable for discharge.  Subjective: Seen and examined earlier this morning.  Sleepy but wakes to voice.  No major events overnight of this morning.  Reports improvement in his cough.  Denies chest pain or shortness of breath.  Vital stable.  Labs yet to be drawn this morning.  Objective: Vitals:   02/05/24 2035 02/06/24 0005 02/06/24 0429 02/06/24 0806  BP:  122/60  132/73 120/63  Pulse: 86 (!) 59 62 67  Resp: 20 19 20 16   Temp:  98.6 F (37 C) 98.5 F (36.9 C) 99.9 F (37.7 C)  TempSrc:    Oral  SpO2: 96% 97% 92% 95%  Weight:   131.3 kg   Height:        Examination:  GENERAL: No apparent distress.  Nontoxic. HEENT: MMM.  Vision and hearing grossly intact.  NECK: Supple.  No apparent JVD.  RESP:  No IWOB.  Fair aeration bilaterally but limited exam CVS:  RRR. Heart sounds normal.  ABD/GI/GU: BS+. Abd soft, NTND.  MSK/EXT:  Moves extremities.  Right TMA.  BLE edema/venous insufficiency.  Faint DP pulses. SKIN: Lower extremity skin changes in keeping with venous sufficiency/chronic PAD. NEURO: Sleepy but wakes to voice.  Oriented appropriately.  No apparent focal neuro deficit. PSYCH: Calm. Normal affect.   Consultants:  Critical care admitted patient  Procedures: Intubation and mechanical ventilation Right chest tube   Microbiology summarized: COVID-19, influenza and RSV PCR nonreactive MRSA PCR screen nonreactive Blood cultures NGTD Respiratory culture normal respiratory flora Right pleural fluid culture NGTD  Assessment and plan: Acute hypoxic and hypercapnic respiratory failure: Present on arrival.  Multifactorial including CHF, underlying COPD, OSA, polypharmacy, encephalopathy and pleural effusion.  Resolved. -Treat treatable causes -Minimum oxygen to keep saturation above 90% -Minimize sedating medications. -Continue Dulera twice daily, DuoNebs and mucolytic's as needed.  Acute on chronic biventricular heart failure: TTE with LVEF of 55 to 60%, moderate  concentric LVH, D-shaped septum with moderately reduced RVSF, moderate LAE and RAE and severe mitral valve stenosis (not new).  BNP elevated to 1360.  CXR with mild to moderate right pleural effusion.  Diuresed with IV Lasix.  Restarted on p.o. Lasix.  -Continue p.o. Lasix 40 mg daily -Started Farxiga 10 g daily on 3/22. -Strict intake and output, daily weight, renal  functions and electrolytes -Follow morning labs.  Right pleural effusion: Chest x-ray showed mild to moderate pleural effusion.  Likely due to CHF. -S/p chest tube and diuretics.  Fluid transudative and culture negative. -Manage CHF as above  Untreated OSA: Patient and family does not recall diagnosis of OSA but his sleep study in 2017 was positive.  Not able to access his spirometry result at the same time.  Not on CPAP or inhalers at home. -Refused trial of CPAP here.  IDDM-2 with hyperglycemia: A1c 7.2%. Recent Labs  Lab 02/05/24 0839 02/05/24 1208 02/05/24 1738 02/05/24 2003 02/06/24 0802  GLUCAP 172* 196* 171* 155* 156*  -Continue SSI-sensitive -Added Farxiga as above  AKI: Baseline Cr ~0.9-1.0.  AKI resolved. Recent Labs    05/18/23 1332 01/29/24 1500 01/29/24 1515 01/30/24 0350 01/31/24 0408 02/01/24 0315 02/02/24 0359 02/03/24 0602 02/04/24 0656 02/05/24 0737  BUN 20 25* 24* 20 21 18 17 22 20 17   CREATININE 1.18 1.48* 1.40* 1.22 1.23 1.15 1.11 1.35* 1.12 1.13  -Continue monitoring.  Follow morning labs  Normocytic anemia: Likely due to chronic disease and acute illness.  No overt bleeding.  He is on Xarelto.  H&H stable now. Recent Labs    01/29/24 1515 01/29/24 1550 01/29/24 2117 01/30/24 0350 01/31/24 0000 01/31/24 0408 02/01/24 0315 02/02/24 0359 02/03/24 0602 02/04/24 0656  HGB 12.9*  12.9* 10.9* 9.6* 9.9* 9.5* 9.3* 9.0* 8.4* 8.2* 8.8*  -Continue monitoring -Check anemia panel  Atrial flutter anticoagulated with Xarelto at baseline -Continue home Xarelto. -Toprol-XL on hold due to borderline bradycardia. -Optimize electrolytes  Bilateral PAD/possible cellulitis: ABI in 03/2023 with noncompressible lower extremity arthritis bilaterally.  He seems to have chronic venous insufficiency as well.  He is on Xarelto.  He is LDL is 114 in 03/2023.  LDL 102.  He is not on a statin. -Continue doxycycline and ceftriaxone until 3/24. -Continue  Xarelto -Start Lipitor.  Chronic pain on opiate-Per narcotic database, he had prescription for oxycodone in February and tramadol and December.  Denies current use.   Currently not in significant pain or discomfort. -Tylenol as needed for pain -Avoid opiates.  Hyponatremia: Resolved  Physical deconditioning: Daughter reports significant physical deconditioning lately.  -PT/OT recommended SNF.   Class I obesity Body mass index is 34.33 kg/m. Nutrition Problem: Inadequate oral intake Etiology: inability to eat Signs/Symptoms: NPO status Interventions: Refer to RD note for recommendations   DVT prophylaxis:  rivaroxaban (XARELTO) tablet 20 mg Start: 01/31/24 1700 rivaroxaban (XARELTO) tablet 20 mg  Code Status: Full code Family Communication: None at bedside Level of care: Telemetry Medical Status is: Inpatient Remains inpatient appropriate because: Safe disposition/SNF.   Final disposition: SNF.  Medically stable for discharge.   35 minutes with more than 50% spent in reviewing records, counseling patient/family and coordinating care.   Sch Meds:  Scheduled Meds:  dapagliflozin propanediol  10 mg Oral Daily   feeding supplement  237 mL Oral Q24H   furosemide  40 mg Oral Daily   guaiFENesin  600 mg Oral BID   insulin aspart  0-5 Units Subcutaneous QHS   insulin aspart  0-9  Units Subcutaneous TID WC   mometasone-formoterol  2 puff Inhalation BID   polyethylene glycol  17 g Oral BID   rivaroxaban  20 mg Oral Q supper   senna-docusate  1 tablet Oral BID   Continuous Infusions:   PRN Meds:.docusate sodium, ipratropium-albuterol, mouth rinse, polyethylene glycol  Antimicrobials: Anti-infectives (From admission, onward)    Start     Dose/Rate Route Frequency Ordered Stop   02/02/24 1000  doxycycline (VIBRA-TABS) tablet 100 mg        100 mg Oral Every 12 hours 02/01/24 2114 02/05/24 2230   01/31/24 2200  doxycycline (VIBRA-TABS) tablet 100 mg  Status:  Discontinued         100 mg Per Tube Every 12 hours 01/31/24 1452 02/01/24 2114   01/30/24 1000  cefTRIAXone (ROCEPHIN) 2 g in sodium chloride 0.9 % 100 mL IVPB        2 g 200 mL/hr over 30 Minutes Intravenous Every 24 hours 01/29/24 1659 02/05/24 1010   01/30/24 1000  doxycycline (VIBRAMYCIN) 100 mg in sodium chloride 0.9 % 250 mL IVPB  Status:  Discontinued        100 mg 125 mL/hr over 120 Minutes Intravenous Every 12 hours 01/29/24 1659 01/31/24 1452   01/29/24 1715  linezolid (ZYVOX) IVPB 600 mg  Status:  Discontinued        600 mg 300 mL/hr over 60 Minutes Intravenous Every 12 hours 01/29/24 1704 01/30/24 1106   01/29/24 1530  cefTRIAXone (ROCEPHIN) 2 g in sodium chloride 0.9 % 100 mL IVPB        2 g 200 mL/hr over 30 Minutes Intravenous Once 01/29/24 1529 01/29/24 1728   01/29/24 1530  azithromycin (ZITHROMAX) 500 mg in sodium chloride 0.9 % 250 mL IVPB        500 mg 250 mL/hr over 60 Minutes Intravenous  Once 01/29/24 1529 01/29/24 1738        I have personally reviewed the following labs and images: CBC: Recent Labs  Lab 01/31/24 0408 02/01/24 0315 02/02/24 0359 02/03/24 0602 02/04/24 0656  WBC 9.8 8.9 8.2 5.4 4.6  HGB 9.3* 9.0* 8.4* 8.2* 8.8*  HCT 29.8* 28.9* 28.0* 27.4* 28.8*  MCV 83.2 84.3 87.8 87.5 87.0  PLT 264 243 221 230 228   BMP &GFR Recent Labs  Lab 02/01/24 0315 02/02/24 0359 02/03/24 0602 02/04/24 0656 02/05/24 0737  NA 136 139 139 140 140  K 3.2* 3.9 3.9 3.6 3.7  CL 96* 99 97* 100 103  CO2 29 26 28 29 27   GLUCOSE 227* 150* 152* 169* 139*  BUN 18 17 22 20 17   CREATININE 1.15 1.11 1.35* 1.12 1.13  CALCIUM 8.0* 8.5* 8.8* 8.7* 9.0  MG 2.1 2.0 2.1 2.1 2.0  PHOS 2.6 3.2 3.4 3.2 3.5   Estimated Creatinine Clearance: 76.9 mL/min (by C-G formula based on SCr of 1.13 mg/dL). Liver & Pancreas: Recent Labs  Lab 02/04/24 0656 02/05/24 0737  ALBUMIN 2.9* 2.8*   No results for input(s): "LIPASE", "AMYLASE" in the last 168 hours. No results for input(s):  "AMMONIA" in the last 168 hours. Diabetic: No results for input(s): "HGBA1C" in the last 72 hours. Recent Labs  Lab 02/05/24 0839 02/05/24 1208 02/05/24 1738 02/05/24 2003 02/06/24 0802  GLUCAP 172* 196* 171* 155* 156*   Cardiac Enzymes: No results for input(s): "CKTOTAL", "CKMB", "CKMBINDEX", "TROPONINI" in the last 168 hours. No results for input(s): "PROBNP" in the last 8760 hours. Coagulation Profile: No results for input(s): "INR", "PROTIME"  in the last 168 hours.  Thyroid Function Tests: No results for input(s): "TSH", "T4TOTAL", "FREET4", "T3FREE", "THYROIDAB" in the last 72 hours. Lipid Profile: Recent Labs    02/05/24 0737  CHOL 145  HDL 30*  LDLCALC 102*  TRIG 64  CHOLHDL 4.8   Anemia Panel: No results for input(s): "VITAMINB12", "FOLATE", "FERRITIN", "TIBC", "IRON", "RETICCTPCT" in the last 72 hours. Urine analysis:    Component Value Date/Time   COLORURINE AMBER (A) 01/29/2024 1529   APPEARANCEUR HAZY (A) 01/29/2024 1529   LABSPEC 1.019 01/29/2024 1529   PHURINE 5.0 01/29/2024 1529   GLUCOSEU NEGATIVE 01/29/2024 1529   GLUCOSEU >=1000 (A) 03/29/2023 1412   HGBUR NEGATIVE 01/29/2024 1529   BILIRUBINUR NEGATIVE 01/29/2024 1529   BILIRUBINUR neg 03/21/2018 0940   KETONESUR NEGATIVE 01/29/2024 1529   PROTEINUR 100 (A) 01/29/2024 1529   UROBILINOGEN 0.2 03/29/2023 1412   NITRITE NEGATIVE 01/29/2024 1529   LEUKOCYTESUR NEGATIVE 01/29/2024 1529   Sepsis Labs: Invalid input(s): "PROCALCITONIN", "LACTICIDVEN"  Microbiology: Recent Results (from the past 240 hours)  Resp panel by RT-PCR (RSV, Flu A&B, Covid) Urine, Catheterized     Status: None   Collection Time: 01/29/24  3:29 PM   Specimen: Urine, Catheterized; Nasal Swab  Result Value Ref Range Status   SARS Coronavirus 2 by RT PCR NEGATIVE NEGATIVE Final   Influenza A by PCR NEGATIVE NEGATIVE Final   Influenza B by PCR NEGATIVE NEGATIVE Final    Comment: (NOTE) The Xpert Xpress SARS-CoV-2/FLU/RSV  plus assay is intended as an aid in the diagnosis of influenza from Nasopharyngeal swab specimens and should not be used as a sole basis for treatment. Nasal washings and aspirates are unacceptable for Xpert Xpress SARS-CoV-2/FLU/RSV testing.  Fact Sheet for Patients: BloggerCourse.com  Fact Sheet for Healthcare Providers: SeriousBroker.it  This test is not yet approved or cleared by the [place] FDA and has been authorized for detection and/or diagnosis of SARS-CoV-2 by FDA under an Emergency Use Authorization (EUA). This EUA will remain in effect (meaning this test can be used) for the duration of the COVID-19 declaration under Section 564(b)(1) of the Act, 21 U.S.C. section 360bbb-3(b)(1), unless the authorization is terminated or revoked.     Resp Syncytial Virus by PCR NEGATIVE NEGATIVE Final    Comment: (NOTE) Fact Sheet for Patients: BloggerCourse.com  Fact Sheet for Healthcare Providers: SeriousBroker.it  This test is not yet approved or cleared by the [place] FDA and has been authorized for detection and/or diagnosis of SARS-CoV-2 by FDA under an Emergency Use Authorization (EUA). This EUA will remain in effect (meaning this test can be used) for the duration of the COVID-19 declaration under Section 564(b)(1) of the Act, 21 U.S.C. section 360bbb-3(b)(1), unless the authorization is terminated or revoked.  Performed at [facility], [address], [place], [place] [address]   Blood Culture (routine x 2)     Status: None   Collection Time: 01/29/24  3:29 PM   Specimen: BLOOD  Result Value Ref Range Status   Specimen Description BLOOD LEFT ANTECUBITAL  Final   Special Requests   Final    BOTTLES DRAWN AEROBIC AND ANAEROBIC Blood Culture results may not be optimal due to an inadequate volume of blood received in culture bottles   Culture   Final     NO GROWTH 5 DAYS Performed at [facility], [address], [place], [place] [address]    Report Status 02/03/2024 FINAL  Final  MRSA Next Gen by  PCR, Nasal     Status: None   Collection Time: 01/29/24  5:10 PM   Specimen: Pleural; Nasal Swab  Result Value Ref Range Status   MRSA by PCR Next Gen NOT DETECTED NOT DETECTED Final    Comment: (NOTE) The GeneXpert MRSA Assay (FDA approved for NASAL specimens only), is one component of a comprehensive MRSA colonization surveillance program. It is not intended to diagnose MRSA infection nor to guide or monitor treatment for MRSA infections. Test performance is not FDA approved in patients less than 9 years old. Performed at [facility], [address], [place], [place] [address]   Culture, Respiratory w Gram Stain     Status: None   Collection Time: 01/29/24  5:11 PM   Specimen: Tracheal Aspirate; Respiratory  Result Value Ref Range Status   Specimen Description TRACHEAL ASPIRATE  Final   Special Requests NONE  Final   Gram Stain   Final    FEW WBC PRESENT, PREDOMINANTLY PMN FEW SQUAMOUS EPITHELIAL CELLS PRESENT ABUNDANT GRAM POSITIVE COCCI FEW GRAM NEGATIVE RODS    Culture   Final    ABUNDANT Normal respiratory flora-no Staph aureus or Pseudomonas seen Performed at [facility], [address], [place], [place] [address]    Report Status 02/01/2024 FINAL  Final  Body fluid culture w Gram Stain     Status: None   Collection Time: 01/29/24  6:00 PM   Specimen: Pleural Fluid  Result Value Ref Range Status   Specimen Description PLEURAL  Final   Special Requests RIGHT  Final   Gram Stain NO WBC SEEN NO ORGANISMS SEEN   Final   Culture   Final    NO GROWTH 3 DAYS Performed at [facility], [address] [place], [place] [address]    Report Status 02/02/2024 FINAL  Final  Blood Culture (routine x 2)     Status: None   Collection Time: 01/29/24  7:45 PM   Specimen: BLOOD  Result Value Ref Range  Status   Specimen Description BLOOD SITE NOT SPECIFIED  Final   Special Requests   Final    BOTTLES DRAWN AEROBIC AND ANAEROBIC Blood Culture adequate volume   Culture   Final    NO GROWTH 5 DAYS Performed at [facility], [address], [place], [place] [address]    Report Status 02/03/2024 FINAL  Final    Radiology Studies: No results found.    Arek Spadafore T. Julyssa Kyer [facility]  If 7PM-7AM, please contact night-coverage [email or web address] 02/06/2024, 9:50 AM

## 2024-02-07 DIAGNOSIS — I4892 Unspecified atrial flutter: Secondary | ICD-10-CM | POA: Diagnosis not present

## 2024-02-07 DIAGNOSIS — E1151 Type 2 diabetes mellitus with diabetic peripheral angiopathy without gangrene: Secondary | ICD-10-CM | POA: Diagnosis not present

## 2024-02-07 DIAGNOSIS — J9601 Acute respiratory failure with hypoxia: Secondary | ICD-10-CM | POA: Diagnosis not present

## 2024-02-07 DIAGNOSIS — J42 Unspecified chronic bronchitis: Secondary | ICD-10-CM | POA: Diagnosis not present

## 2024-02-07 LAB — GLUCOSE, CAPILLARY
Glucose-Capillary: 156 mg/dL — ABNORMAL HIGH (ref 70–99)
Glucose-Capillary: 133 mg/dL — ABNORMAL HIGH (ref 70–99)
Glucose-Capillary: 249 mg/dL — ABNORMAL HIGH (ref 70–99)
Glucose-Capillary: 172 mg/dL — ABNORMAL HIGH (ref 70–99)

## 2024-02-07 MED ORDER — ATORVASTATIN CALCIUM 40 MG PO TABS: 40.0000 mg | ORAL_TABLET | Freq: Every day | ORAL | Status: AC

## 2024-02-07 MED ORDER — SENNOSIDES-DOCUSATE SODIUM 8.6-50 MG PO TABS: 1.0000 | ORAL_TABLET | Freq: Two times a day (BID) | ORAL | Status: AC | PRN

## 2024-02-07 MED ORDER — POLYETHYLENE GLYCOL 3350 17 G PO PACK: 17.0000 g | PACK | Freq: Two times a day (BID) | ORAL | 0 refills | Status: AC | PRN

## 2024-02-07 MED ORDER — MOMETASONE FURO-FORMOTEROL FUM 200-5 MCG/ACT IN AERO: 2.0000 | INHALATION_SPRAY | Freq: Two times a day (BID) | RESPIRATORY_TRACT | Status: AC

## 2024-02-07 MED ORDER — FUROSEMIDE 40 MG PO TABS: 40.0000 mg | ORAL_TABLET | Freq: Every day | ORAL | Status: AC

## 2024-02-07 MED ORDER — DAPAGLIFLOZIN PROPANEDIOL 10 MG PO TABS: 10.0000 mg | ORAL_TABLET | Freq: Every day | ORAL | Status: AC

## 2024-02-07 MED ORDER — ENSURE ENLIVE PO LIQD: 237.0000 mL | ORAL | Status: AC

## 2024-02-07 MED ORDER — INSULIN ASPART 100 UNIT/ML IJ SOLN
0.0000 [IU] | Freq: Three times a day (TID) | INTRAMUSCULAR | Status: DC
Start: 2024-02-07 — End: 2024-02-11

## 2024-02-07 MED ORDER — IPRATROPIUM-ALBUTEROL 0.5-2.5 (3) MG/3ML IN SOLN: 3.0000 mL | RESPIRATORY_TRACT | Status: AC | PRN

## 2024-02-07 NOTE — Progress Notes (Signed)
 PROGRESS NOTE  Jason [name]:[id number] DOB: Nov 12, 1941   PCP: [name], MD  Patient is from: Home.  Lives with wife who has dementia.  Has caregiver.   DOA: 01/29/2024 LOS: 9  Chief complaints Chief Complaint  Patient presents with   Respiratory Distress   Altered Mental Status     Brief Narrative / Interim history: 83 year old M with PMH of diastolic CHF, sev mitral valve stenosis, OSA, atrial flutter on Xarelto, DM-2, HTN, HLD, PAD and chronic pain brought to ED due to altered mental status and respiratory distress, and admitted to ICU with acute on chronic respiratory failure with hypoxia and hypercapnia, CHF exacerbation and pleural effusion.  Reportedly, patient was hypoxic to 60s with witnessed LOC when first responders arrived and had brief CPR.  However, found to have strong pulses when EMS arrived.  In ED, hypothermic, tachypneic with mild hypertension. VBG 7.1/95/75/27 suggesting acute respiratory acidosis.  K3.4. Na 133.  Cr 1.4.  BUN 24.  Hgb 11.  BNP 1360.  Troponin 40.  Lactic acid 3.8.  CXR with small to moderate layering right pleural effusion.  Started on broad-spectrum antibiotics.  Intubated and admitted to ICU.    While in ICU, he had chest tube placement for pleural effusion.  Diuresed with IV Lasix.  TTE with LVEF of 55 to 60%, moderate concentric LVH, D-shaped septum with moderately reduced RVSF, moderate LAE and RAE and severe mitral valve stenosis (not new).   Patient was extubated on 3/19 and transferred to [facility] on 3/21 on room air.  Therapy recommending SNF.  Medically stable for discharge.  Subjective: Seen and examined earlier this morning.  No major events overnight of this morning.  No complaints.  Getting ready to eat breakfast.  Objective: Vitals:   02/07/24 0740 02/07/24 0755 02/07/24 0837 02/07/24 1201  BP: (!) 116/56 (!) 115/48  (!) 122/54  Pulse: 60 (!) 58  60  Resp:    16  Temp: 100 F (37.8 C) 99.3 F (37.4 C)   98.2 F (36.8 C)  TempSrc: Oral Oral  Oral  SpO2:  96% 94% 96%  Weight:      Height:        Examination:  GENERAL: No apparent distress.  Nontoxic. HEENT: MMM.  Vision and hearing grossly intact.  NECK: Supple.  No apparent JVD.  RESP:  No IWOB.  Fair aeration bilaterally but limited exam CVS:  RRR. Heart sounds normal.  ABD/GI/GU: BS+. Abd soft, NTND.  MSK/EXT:  Moves extremities.  Right TMA.  BLE edema/venous insufficiency.  Faint DP pulses. SKIN: Lower extremity skin changes in keeping with venous sufficiency/chronic PAD. NEURO: Awake and alert.  Oriented x 4.  No apparent focal neurodeficit. PSYCH: Calm. Normal affect.   Consultants:  Critical care admitted patient  Procedures: Intubation and mechanical ventilation Right chest tube   Microbiology summarized: COVID-19, influenza and RSV PCR nonreactive MRSA PCR screen nonreactive Blood cultures NGTD Respiratory culture normal respiratory flora Right pleural fluid culture NGTD  Assessment and plan: Acute hypoxic and hypercapnic respiratory failure: Present on arrival.  Multifactorial including CHF, underlying COPD, OSA, polypharmacy, encephalopathy and pleural effusion.  Resolved. -Treat treatable causes -Minimize sedating medications. -Continue Dulera twice daily, DuoNebs and mucolytic's as needed.  Acute on chronic biventricular heart failure: TTE with LVEF of 55 to 60%, moderate concentric LVH, D-shaped septum with moderately reduced RVSF, moderate LAE and RAE and severe mitral valve stenosis (not new).  BNP elevated to 1360.  CXR with mild to  moderate right pleural effusion.  Diuresed with IV Lasix.  Restarted on p.o. Lasix.  -Continue p.o. Lasix 40 mg daily -Started Farxiga 10 g daily on 3/22. -Strict intake and output, daily weight, renal functions and electrolytes  Right pleural effusion: Chest x-ray showed mild to moderate pleural effusion.  Likely due to CHF. -S/p chest tube and diuretics.  Fluid transudative  and culture negative. -Manage CHF as above  Untreated OSA: Patient and family does not recall diagnosis of OSA but his sleep study in 2017 was positive.  Not able to access his spirometry result at the same time.  Not on CPAP or inhalers at home. -Refused trial of CPAP here.  IDDM-2 with hyperglycemia: A1c 7.2%. Recent Labs  Lab 02/06/24 1125 02/06/24 1701 02/06/24 2006 02/07/24 0752 02/07/24 1200  GLUCAP 196* 197* 281* 156* 133*  -Continue SSI-sensitive -Added Farxiga as above  AKI: Baseline Cr ~0.9-1.0.  AKI resolved. Recent Labs    01/29/24 1500 01/29/24 1515 01/30/24 0350 01/31/24 0408 02/01/24 0315 02/02/24 0359 02/03/24 0602 02/04/24 0656 02/05/24 0737 02/06/24 1127  BUN 25* 24* 20 21 18 17 22 20 17 15   CREATININE 1.48* 1.40* 1.22 1.23 1.15 1.11 1.35* 1.12 1.13 1.18  -Continue monitoring.  Follow morning labs  Normocytic anemia: Likely due to chronic disease and acute illness.  No overt bleeding.  He is on Xarelto.  H&H stable now.  Anemia panel with iron deficiency Recent Labs    01/29/24 1550 01/29/24 2117 01/30/24 0350 01/31/24 0000 01/31/24 0408 02/01/24 0315 02/02/24 0359 02/03/24 0602 02/04/24 0656 02/06/24 1127  HGB 10.9* 9.6* 9.9* 9.5* 9.3* 9.0* 8.4* 8.2* 8.8* 10.1*  -Continue monitoring -Received IV Venofer 300 mg on 3/24.  Atrial flutter anticoagulated with Xarelto at baseline -Continue home Xarelto. -Toprol-XL on hold due to borderline bradycardia. -Optimize electrolytes  Bilateral PAD/possible cellulitis: ABI in 03/2023 with noncompressible lower extremity arthritis bilaterally.  He seems to have chronic venous insufficiency as well.  He is on Xarelto.  He is LDL is 114 in 03/2023.  LDL 102.  He is not on a statin. -Continue doxycycline and ceftriaxone until 3/24. -Continue Xarelto -Continue Lipitor.  Chronic pain on opiate-Per narcotic database, he had prescription for oxycodone in February and tramadol and December.  Denies current use.    Currently not in significant pain or discomfort. -Tylenol as needed for pain -Avoid opiates.  Hyponatremia: Resolved  Physical deconditioning: Daughter reports significant physical deconditioning lately.  -PT/OT recommended SNF.   Class I obesity Body mass index is 34.33 kg/m. Nutrition Problem: Inadequate oral intake Etiology: inability to eat Signs/Symptoms: NPO status Interventions: Refer to RD note for recommendations   DVT prophylaxis:  rivaroxaban (XARELTO) tablet 20 mg Start: 01/31/24 1700 rivaroxaban (XARELTO) tablet 20 mg  Code Status: Full code Family Communication: None at bedside Level of care: Telemetry Medical Status is: Inpatient Remains inpatient appropriate because: Safe disposition/SNF.   Final disposition: SNF.  Medically stable for discharge.   35 minutes with more than 50% spent in reviewing records, counseling patient/family and coordinating care.   Sch Meds:  Scheduled Meds:  atorvastatin  40 mg Oral Daily   dapagliflozin propanediol  10 mg Oral Daily   feeding supplement  237 mL Oral Q24H   furosemide  40 mg Oral Daily   guaiFENesin  600 mg Oral BID   insulin aspart  0-5 Units Subcutaneous QHS   insulin aspart  0-9 Units Subcutaneous TID WC   mometasone-formoterol  2 puff Inhalation BID   polyethylene glycol  17 g Oral BID   rivaroxaban  20 mg Oral Q supper   senna-docusate  1 tablet Oral BID   Continuous Infusions:   PRN Meds:.docusate sodium, ipratropium-albuterol, mouth rinse, polyethylene glycol  Antimicrobials: Anti-infectives (From admission, onward)    Start     Dose/Rate Route Frequency Ordered Stop   02/02/24 1000  doxycycline (VIBRA-TABS) tablet 100 mg        100 mg Oral Every 12 hours 02/01/24 2114 02/05/24 2230   01/31/24 2200  doxycycline (VIBRA-TABS) tablet 100 mg  Status:  Discontinued        100 mg Per Tube Every 12 hours 01/31/24 1452 02/01/24 2114   01/30/24 1000  cefTRIAXone (ROCEPHIN) 2 g in sodium chloride 0.9 %  100 mL IVPB        2 g 200 mL/hr over 30 Minutes Intravenous Every 24 hours 01/29/24 1659 02/05/24 1010   01/30/24 1000  doxycycline (VIBRAMYCIN) 100 mg in sodium chloride 0.9 % 250 mL IVPB  Status:  Discontinued        100 mg 125 mL/hr over 120 Minutes Intravenous Every 12 hours 01/29/24 1659 01/31/24 1452   01/29/24 1715  linezolid (ZYVOX) IVPB 600 mg  Status:  Discontinued        600 mg 300 mL/hr over 60 Minutes Intravenous Every 12 hours 01/29/24 1704 01/30/24 1106   01/29/24 1530  cefTRIAXone (ROCEPHIN) 2 g in sodium chloride 0.9 % 100 mL IVPB        2 g 200 mL/hr over 30 Minutes Intravenous Once 01/29/24 1529 01/29/24 1728   01/29/24 1530  azithromycin (ZITHROMAX) 500 mg in sodium chloride 0.9 % 250 mL IVPB        500 mg 250 mL/hr over 60 Minutes Intravenous  Once 01/29/24 1529 01/29/24 1738        I have personally reviewed the following labs and images: CBC: Recent Labs  Lab 02/01/24 0315 02/02/24 0359 02/03/24 0602 02/04/24 0656 02/06/24 1127  WBC 8.9 8.2 5.4 4.6 5.3  HGB 9.0* 8.4* 8.2* 8.8* 10.1*  HCT 28.9* 28.0* 27.4* 28.8* 33.6*  MCV 84.3 87.8 87.5 87.0 89.6  PLT 243 221 230 228 327   BMP &GFR Recent Labs  Lab 02/02/24 0359 02/03/24 0602 02/04/24 0656 02/05/24 0737 02/06/24 1127  NA 139 139 140 140 139  K 3.9 3.9 3.6 3.7 3.6  CL 99 97* 100 103 102  CO2 26 28 29 27 27   GLUCOSE 150* 152* 169* 139* 193*  BUN 17 22 20 17 15   CREATININE 1.11 1.35* 1.12 1.13 1.18  CALCIUM 8.5* 8.8* 8.7* 9.0 9.3  MG 2.0 2.1 2.1 2.0 2.2  PHOS 3.2 3.4 3.2 3.5 4.0   Estimated Creatinine Clearance: 73.6 mL/min (by C-G formula based on SCr of 1.18 mg/dL). Liver & Pancreas: Recent Labs  Lab 02/04/24 0656 02/05/24 0737 02/06/24 1127  ALBUMIN 2.9* 2.8* 3.4*   No results for input(s): "LIPASE", "AMYLASE" in the last 168 hours. No results for input(s): "AMMONIA" in the last 168 hours. Diabetic: No results for input(s): "HGBA1C" in the last 72 hours. Recent Labs  Lab  02/06/24 1125 02/06/24 1701 02/06/24 2006 02/07/24 0752 02/07/24 1200  GLUCAP 196* 197* 281* 156* 133*   Cardiac Enzymes: No results for input(s): "CKTOTAL", "CKMB", "CKMBINDEX", "TROPONINI" in the last 168 hours. No results for input(s): "PROBNP" in the last 8760 hours. Coagulation Profile: No results for input(s): "INR", "PROTIME" in the last 168 hours.  Thyroid Function Tests: No results for input(s): "TSH", "  T4TOTAL", "FREET4", "T3FREE", "THYROIDAB" in the last 72 hours. Lipid Profile: Recent Labs    02/05/24 0737  CHOL 145  HDL 30*  LDLCALC 102*  TRIG 64  CHOLHDL 4.8   Anemia Panel: Recent Labs    02/06/24 1127  VITAMINB12 783  FOLATE 21.9  FERRITIN 62  TIBC 412  IRON 27*  RETICCTPCT 3.2*   Urine analysis:    Component Value Date/Time   COLORURINE AMBER (A) 01/29/2024 1529   APPEARANCEUR HAZY (A) 01/29/2024 1529   LABSPEC 1.019 01/29/2024 1529   PHURINE 5.0 01/29/2024 1529   GLUCOSEU NEGATIVE 01/29/2024 1529   GLUCOSEU >=1000 (A) 03/29/2023 1412   HGBUR NEGATIVE 01/29/2024 1529   BILIRUBINUR NEGATIVE 01/29/2024 1529   BILIRUBINUR neg 03/21/2018 0940   KETONESUR NEGATIVE 01/29/2024 1529   PROTEINUR 100 (A) 01/29/2024 1529   UROBILINOGEN 0.2 03/29/2023 1412   NITRITE NEGATIVE 01/29/2024 1529   LEUKOCYTESUR NEGATIVE 01/29/2024 1529   Sepsis Labs: Invalid input(s): "PROCALCITONIN", "LACTICIDVEN"  Microbiology: Recent Results (from the past 240 hours)  Resp panel by RT-PCR (RSV, Flu A&B, Covid) Urine, Catheterized     Status: None   Collection Time: 01/29/24  3:29 PM   Specimen: Urine, Catheterized; Nasal Swab  Result Value Ref Range Status   SARS Coronavirus 2 by RT PCR NEGATIVE NEGATIVE Final   Influenza A by PCR NEGATIVE NEGATIVE Final   Influenza B by PCR NEGATIVE NEGATIVE Final    Comment: (NOTE) The Xpert Xpress SARS-CoV-2/FLU/RSV plus assay is intended as an aid in the diagnosis of influenza from Nasopharyngeal swab specimens and should not  be used as a sole basis for treatment. Nasal washings and aspirates are unacceptable for Xpert Xpress SARS-CoV-2/FLU/RSV testing.  Fact Sheet for Patients: BloggerCourse.com  Fact Sheet for Healthcare Providers: SeriousBroker.it  This test is not yet approved or cleared by the [place] FDA and has been authorized for detection and/or diagnosis of SARS-CoV-2 by FDA under an Emergency Use Authorization (EUA). This EUA will remain in effect (meaning this test can be used) for the duration of the COVID-19 declaration under Section 564(b)(1) of the Act, 21 U.S.C. section 360bbb-3(b)(1), unless the authorization is terminated or revoked.     Resp Syncytial Virus by PCR NEGATIVE NEGATIVE Final    Comment: (NOTE) Fact Sheet for Patients: BloggerCourse.com  Fact Sheet for Healthcare Providers: SeriousBroker.it  This test is not yet approved or cleared by the [place] FDA and has been authorized for detection and/or diagnosis of SARS-CoV-2 by FDA under an Emergency Use Authorization (EUA). This EUA will remain in effect (meaning this test can be used) for the duration of the COVID-19 declaration under Section 564(b)(1) of the Act, 21 U.S.C. section 360bbb-3(b)(1), unless the authorization is terminated or revoked.  Performed at [facility], [address] [place], [place] [address]   Blood Culture (routine x 2)     Status: None   Collection Time: 01/29/24  3:29 PM   Specimen: BLOOD  Result Value Ref Range Status   Specimen Description BLOOD LEFT ANTECUBITAL  Final   Special Requests   Final    BOTTLES DRAWN AEROBIC AND ANAEROBIC Blood Culture results may not be optimal due to an inadequate volume of blood received in culture bottles   Culture   Final    NO GROWTH 5 DAYS Performed at [facility], [address], [place], [place] [address]    Report Status  02/03/2024 FINAL  Final  MRSA Next Gen by PCR, Nasal  Status: None   Collection Time: 01/29/24  5:10 PM   Specimen: Pleural; Nasal Swab  Result Value Ref Range Status   MRSA by PCR Next Gen NOT DETECTED NOT DETECTED Final    Comment: (NOTE) The GeneXpert MRSA Assay (FDA approved for NASAL specimens only), is one component of a comprehensive MRSA colonization surveillance program. It is not intended to diagnose MRSA infection nor to guide or monitor treatment for MRSA infections. Test performance is not FDA approved in patients less than 20 years old. Performed at [facility], [address], [place], [place] [address]   Culture, Respiratory w Gram Stain     Status: None   Collection Time: 01/29/24  5:11 PM   Specimen: Tracheal Aspirate; Respiratory  Result Value Ref Range Status   Specimen Description TRACHEAL ASPIRATE  Final   Special Requests NONE  Final   Gram Stain   Final    FEW WBC PRESENT, PREDOMINANTLY PMN FEW SQUAMOUS EPITHELIAL CELLS PRESENT ABUNDANT GRAM POSITIVE COCCI FEW GRAM NEGATIVE RODS    Culture   Final    ABUNDANT Normal respiratory flora-no Staph aureus or Pseudomonas seen Performed at [facility], [address] [place], [place] [address]    Report Status 02/01/2024 FINAL  Final  Body fluid culture w Gram Stain     Status: None   Collection Time: 01/29/24  6:00 PM   Specimen: Pleural Fluid  Result Value Ref Range Status   Specimen Description PLEURAL  Final   Special Requests RIGHT  Final   Gram Stain NO WBC SEEN NO ORGANISMS SEEN   Final   Culture   Final    NO GROWTH 3 DAYS Performed at [facility], [address], [place], [place] [address]    Report Status 02/02/2024 FINAL  Final  Blood Culture (routine x 2)     Status: None   Collection Time: 01/29/24  7:45 PM   Specimen: BLOOD  Result Value Ref Range Status   Specimen Description BLOOD SITE NOT SPECIFIED  Final   Special Requests   Final    BOTTLES DRAWN AEROBIC  AND ANAEROBIC Blood Culture adequate volume   Culture   Final    NO GROWTH 5 DAYS Performed at [facility], [address] [place], [place] [address]    Report Status 02/03/2024 FINAL  Final    Radiology Studies: No results found.    Ahnna Dungan T. Ezrah Panning [facility]  If 7PM-7AM, please contact night-coverage [email or web address] 02/07/2024, 1:05 PM

## 2024-02-07 NOTE — Plan of Care (Signed)
  Problem: Education: Goal: Knowledge of General Education information will improve Description: Including pain rating scale, medication(s)/side effects and non-pharmacologic comfort measures Outcome: Progressing   Problem: Health Behavior/Discharge Planning: Goal: Ability to manage health-related needs will improve Outcome: Progressing   Problem: Clinical Measurements: Goal: Ability to maintain clinical measurements within normal limits will improve Outcome: Progressing Goal: Will remain free from infection Outcome: Progressing Goal: Cardiovascular complication will be avoided Outcome: Progressing   Problem: Activity: Goal: Risk for activity intolerance will decrease Outcome: Progressing   Problem: Nutrition: Goal: Adequate nutrition will be maintained Outcome: Progressing   Problem: Coping: Goal: Level of anxiety will decrease Outcome: Progressing   Problem: Elimination: Goal: Will not experience complications related to bowel motility Outcome: Progressing Goal: Will not experience complications related to urinary retention Outcome: Progressing   Problem: Pain Managment: Goal: General experience of comfort will improve and/or be controlled Outcome: Progressing   Problem: Safety: Goal: Ability to remain free from injury will improve Outcome: Progressing   Problem: Skin Integrity: Goal: Risk for impaired skin integrity will decrease Outcome: Progressing   Problem: Coping: Goal: Ability to adjust to condition or change in health will improve Outcome: Progressing   Problem: Fluid Volume: Goal: Ability to maintain a balanced intake and output will improve Outcome: Progressing   Problem: Metabolic: Goal: Ability to maintain appropriate glucose levels will improve Outcome: Progressing   Problem: Nutritional: Goal: Maintenance of adequate nutrition will improve Outcome: Progressing   Problem: Skin Integrity: Goal: Risk for impaired skin integrity will  decrease Outcome: Progressing   Problem: Tissue Perfusion: Goal: Adequacy of tissue perfusion will improve Outcome: Progressing

## 2024-02-07 NOTE — Progress Notes (Signed)
   02/07/24 2353  BiPAP/CPAP/SIPAP  BiPAP/CPAP/SIPAP Pt Type Adult  BiPAP/CPAP/SIPAP Resmed  Mask Size Large  Respiratory Rate 16 breaths/min  IPAP 15 cmH20  EPAP 5 cmH2O  FiO2 (%) 21 %  Patient Home Machine No  Patient Home Mask No  BiPAP/CPAP /SiPAP Vitals  Pulse Rate (!) 58  Resp 18  SpO2 95 %

## 2024-02-07 NOTE — Plan of Care (Signed)
  Problem: Health Behavior/Discharge Planning: Goal: Ability to manage health-related needs will improve Outcome: Progressing   Problem: Activity: Goal: Risk for activity intolerance will decrease Outcome: Progressing   Problem: Nutrition: Goal: Adequate nutrition will be maintained Outcome: Progressing   Problem: Elimination: Goal: Will not experience complications related to bowel motility Outcome: Progressing Goal: Will not experience complications related to urinary retention Outcome: Progressing   Problem: Safety: Goal: Ability to remain free from injury will improve Outcome: Progressing

## 2024-02-07 NOTE — Progress Notes (Signed)
 Occupational Therapy Treatment [name] MRN: [id number] DOB: January 16, 1941 Today's Date: 02/07/2024   History of present illness 83 yo male admitted 3/16 with acute respiratory distress, AMS, hypoxic, Acutely decompensated HFpEF. Intubated 3/16-3/19. PMhx: COPD, HTN, HLD, HFpEF, sleep apnea, T2DM, AFib, PAD, Rt transmet amputation   OT comments  Pt making fair progress towards OT goals, remains eager to participate though does fatigue quickly. Emphasis on standing tolerance and posture within [place] to maximize pt confidence. Pt requires Mod A to stand from elevated bed with [place], able to participate in basic UB ADLs standing within [place] at sink before opting to complete remainder of ADLs EOB. Continue to recommend inpatient follow up therapy, <3 hours/day at DC.      If plan is discharge home, recommend the following:  A lot of help with walking and/or transfers;Two people to help with walking and/or transfers;Two people to help with bathing/dressing/bathroom;A lot of help with bathing/dressing/bathroom   Equipment Recommendations  Other (comment) (daughter would like drop arm BSC; TBD)    Recommendations for Other Services      Precautions / Restrictions Precautions Precautions: Fall Restrictions Weight Bearing Restrictions Per Provider Order: No       Mobility Bed Mobility Overal bed mobility: Needs Assistance Bed Mobility: Supine to Sit, Sit to Supine     Supine to sit: Mod assist, HOB elevated, Used rails Sit to supine: Mod assist   General bed mobility comments: Mod A to scoot hips to EOB, cues for reaching for bedrail and assist to lift trunk. Cued to use bedrail to guide trunk to supine though poor control of descent. Assist for BLE back to bed    Transfers Overall transfer level: Needs assistance Equipment used: Ambulation equipment used Transfers: Sit to/from Stand Sit to Stand: Mod assist, From elevated surface           General  transfer comment: Mod A x 1 from elevated surface to stand in [place] with pt using LUE primarily to pull up. Min A from [place] pads x 2 during session.     Balance Overall balance assessment: Needs assistance, History of Falls Sitting-balance support: No upper extremity supported, Feet supported Sitting balance-Leahy Scale: Fair     Standing balance support: Bilateral upper extremity supported, During functional activity, Reliant on assistive device for balance Standing balance-Leahy Scale: Poor                             ADL either performed or assessed with clinical judgement   ADL Overall ADL's : Needs assistance/impaired     Grooming: Contact guard assist Grooming Details (indicate cue type and reason): Standing in [place], CGA for posture, cues for reaching to sink to wet washcloth and wipe fast Upper Body Bathing: Minimal assistance;Sitting Upper Body Bathing Details (indicate cue type and reason): assist to bathe/lotion back. pt able to bathe and lotion upper arms Lower Body Bathing: Moderate assistance;Sitting/lateral leans Lower Body Bathing Details (indicate cue type and reason): Able to apply lotion to upper LE and halfway down shin seated EOB. OT assisting with lower half of lower LE EOB Upper Body Dressing : Minimal assistance;Sitting Upper Body Dressing Details (indicate cue type and reason): donning clean gown                   General ADL Comments: Emphasis on standing tolerance/posture in [place] with +1 assist to get to sink. Pt fatigued fairly quickly and  requested return to bed    Extremity/Trunk Assessment Upper Extremity Assessment Upper Extremity Assessment: Right hand dominant;RUE deficits/detail RUE Deficits / Details: reports impaired strengthh, ROM and discomfort in this UE for months though unsure why. edematous compared to LUE. grip strength weaker and AAROM shoulder flex to 95*, abduct to 90* RUE Coordination: decreased fine motor;decreased  gross motor   Lower Extremity Assessment Lower Extremity Assessment: Defer to PT evaluation        Vision   Vision Assessment?: No apparent visual deficits   Perception     Praxis     Communication Communication Communication: No apparent difficulties Factors Affecting Communication: Reduced clarity of speech   Cognition Arousal: Alert Behavior During Therapy: WFL for tasks assessed/performed Cognition: Cognition impaired     Awareness: Intellectual awareness intact, Online awareness impaired Memory impairment (select all impairments): Declarative long-term memory Attention impairment (select first level of impairment): Selective attention Executive functioning impairment (select all impairments): Sequencing, Reasoning, Problem solving OT - Cognition Comments: pleasant, witty humor. follows commands with multimodal cues. Pt with some decreased insight into medical complexities as pt reports nursing and MD told him to not move around a lot                 Following commands: Intact        Cueing   Cueing Techniques: Verbal cues, Tactile cues  Exercises      Shoulder Instructions       General Comments      Pertinent Vitals/ Pain       Pain Assessment Pain Assessment: No/denies pain  Home Living                                          Prior Functioning/Environment              Frequency  Min 1X/week        Progress Toward Goals  OT Goals(current goals can now be found in the care plan section)  Progress towards OT goals: Progressing toward goals  Acute Rehab OT Goals Patient Stated Goal: gradually work on strength OT Goal Formulation: With patient Time For Goal Achievement: 02/23/24 Potential to Achieve Goals: Good ADL Goals Pt Will Perform Grooming: with contact guard assist;standing Pt Will Perform Lower Body Bathing: with mod assist;sitting/lateral leans;sit to/from stand Pt Will Transfer to Toilet: with min  assist;ambulating Pt Will Perform Toileting - Clothing Manipulation and hygiene: with mod assist;sit to/from stand;sitting/lateral leans  Plan      Co-evaluation                 AM-PAC OT "6 Clicks" Daily Activity     Outcome Measure   Help from another person eating meals?: A Little Help from another person taking care of personal grooming?: A Little Help from another person toileting, which includes using toliet, bedpan, or urinal?: Total Help from another person bathing (including washing, rinsing, drying)?: A Lot Help from another person to put on and taking off regular upper body clothing?: A Little Help from another person to put on and taking off regular lower body clothing?: A Lot 6 Click Score: 14    End of Session Equipment Utilized During Treatment: Gait belt  OT Visit Diagnosis: Other abnormalities of gait and mobility (R26.89);Unsteadiness on feet (R26.81);Muscle weakness (generalized) (M62.81)   Activity Tolerance Patient tolerated treatment well   Patient Left in bed;with  call bell/phone within reach;with bed alarm set   Nurse Communication Mobility status        Time: 1610-9604 OT Time Calculation (min): 33 min  Charges: OT General Charges $OT Visit: 1 Visit OT Treatments $Self Care/Home Management : 23-37 mins  Bradd Canary, OTR/L Acute Rehab Services Office: [phone]   Lorre Munroe 02/07/2024, 10:58 AM

## 2024-02-07 NOTE — TOC Progression Note (Signed)
 Transition of Care [facility]) - Progression Note    Patient Details  Name: Jason [name] MRN: [id number] Date of Birth: 29-Dec-1940  [facility] CM/SW Contact  Ayeden Gladman A [place], LCSW Phone Number: 02/07/2024, 3:26 PM  Clinical Narrative:     CSW met with pt at bedside to update on bed decision, daughter no longer at bedside. CSW contacted pt's daughter, [name]. Informed her that bed offer was made to [facility]. She stated that she was still trying to coordinate possible transport to facility with family assistance and would let CSW know.    CSW was contacted by Aundra Millet at [facility], stated pt bed is available tomorrow if authorization is approved.   CSW made attempt to start insurance authorization. Pt not managed by [facility] portal. CSW reached out to [place] in admissions at [facility] to request authorization, left vm with contact info.   TOC will continue to follow.   Expected Discharge Plan: Skilled Nursing Facility Barriers to Discharge: Continued Medical Work up, SNF Pending bed offer  Expected Discharge Plan and Services In-house Referral: Clinical Social Work   Post Acute Care Choice: Skilled Nursing Facility Living arrangements for the past 2 months: Single Family Home                                       Social Determinants of Health (SDOH) Interventions SDOH Screenings   Food Insecurity: No Food Insecurity (04/26/2023)  Housing: Low Risk  (02/01/2024)  Transportation Needs: Unmet Transportation Needs (02/01/2024)  Utilities: Not At Risk (02/01/2024)  Alcohol Screen: Low Risk  (06/01/2022)  Depression (PHQ2-9): Low Risk  (06/01/2022)  Financial Resource Strain: Low Risk  (06/01/2022)  Physical Activity: Sufficiently Active (06/01/2022)  Social Connections: Socially Isolated (02/01/2024)  Stress: Stress Concern Present (06/01/2022)  Tobacco Use: Medium Risk (01/29/2024)    Readmission Risk Interventions    04/30/2023   12:25 PM   Readmission Risk Prevention Plan  Transportation Screening Complete  PCP or Specialist Appt within 3-5 Days Complete  HRI or Home Care Consult Complete  Social Work Consult for Recovery Care Planning/Counseling Complete  Palliative Care Screening Complete  Medication Review Oceanographer) Complete

## 2024-02-08 DIAGNOSIS — E1151 Type 2 diabetes mellitus with diabetic peripheral angiopathy without gangrene: Secondary | ICD-10-CM | POA: Diagnosis not present

## 2024-02-08 DIAGNOSIS — I4892 Unspecified atrial flutter: Secondary | ICD-10-CM | POA: Diagnosis not present

## 2024-02-08 DIAGNOSIS — J42 Unspecified chronic bronchitis: Secondary | ICD-10-CM | POA: Diagnosis not present

## 2024-02-08 DIAGNOSIS — J9601 Acute respiratory failure with hypoxia: Secondary | ICD-10-CM | POA: Diagnosis not present

## 2024-02-08 LAB — GLUCOSE, CAPILLARY
Glucose-Capillary: 153 mg/dL — ABNORMAL HIGH (ref 70–99)
Glucose-Capillary: 176 mg/dL — ABNORMAL HIGH (ref 70–99)
Glucose-Capillary: 180 mg/dL — ABNORMAL HIGH (ref 70–99)
Glucose-Capillary: 198 mg/dL — ABNORMAL HIGH (ref 70–99)
Glucose-Capillary: 243 mg/dL — ABNORMAL HIGH (ref 70–99)

## 2024-02-08 LAB — CBC
HCT: 31.7 % — ABNORMAL LOW (ref 39.0–52.0)
Hemoglobin: 9.4 g/dL — ABNORMAL LOW (ref 13.0–17.0)
MCH: 26.5 pg (ref 26.0–34.0)
MCHC: 29.7 g/dL — ABNORMAL LOW (ref 30.0–36.0)
MCV: 89.3 fL (ref 80.0–100.0)
Platelets: 277 10*3/uL (ref 150–400)
RBC: 3.55 MIL/uL — ABNORMAL LOW (ref 4.22–5.81)
RDW: 18 % — ABNORMAL HIGH (ref 11.5–15.5)
WBC: 5.2 10*3/uL (ref 4.0–10.5)
nRBC: 0 % (ref 0.0–0.2)

## 2024-02-08 LAB — RENAL FUNCTION PANEL
Albumin: 3.1 g/dL — ABNORMAL LOW (ref 3.5–5.0)
Anion gap: 12 (ref 5–15)
BUN: 15 mg/dL (ref 8–23)
CO2: 23 mmol/L (ref 22–32)
Calcium: 9 mg/dL (ref 8.9–10.3)
Chloride: 103 mmol/L (ref 98–111)
Creatinine, Ser: 1.21 mg/dL (ref 0.61–1.24)
GFR, Estimated: >60 mL/min (ref >60)
Glucose, Bld: 236 mg/dL — ABNORMAL HIGH (ref 70–99)
Phosphorus: 3.3 mg/dL (ref 2.5–4.6)
Potassium: 4.4 mmol/L (ref 3.5–5.1)
Sodium: 138 mmol/L (ref 135–145)

## 2024-02-08 LAB — MAGNESIUM: Magnesium: 2.1 mg/dL (ref 1.7–2.4)

## 2024-02-08 MED ORDER — INSULIN ASPART 100 UNIT/ML IJ SOLN
0.0000 [IU] | Freq: Three times a day (TID) | INTRAMUSCULAR | Status: DC
  Administered 2024-02-08: 2 [IU] via SUBCUTANEOUS
  Administered 2024-02-09 (×2): 3 [IU] via SUBCUTANEOUS
  Administered 2024-02-09: 8 [IU] via SUBCUTANEOUS
  Administered 2024-02-10: 2 [IU] via SUBCUTANEOUS

## 2024-02-08 NOTE — Progress Notes (Signed)
 PROGRESS NOTE  Jason [name] RUE:[id number] DOB: 08/18/1941   PCP: [name], MD  Patient is from: Home.  Lives with wife who has dementia.  Has caregiver.   DOA: 01/29/2024 LOS: 10  Chief complaints Chief Complaint  Patient presents with   Respiratory Distress   Altered Mental Status     Brief Narrative / Interim history: 83 year old M with PMH of diastolic CHF, sev mitral valve stenosis, OSA, atrial flutter on Xarelto, DM-2, HTN, HLD, PAD and chronic pain brought to ED due to altered mental status and respiratory distress, and admitted to ICU with acute on chronic respiratory failure with hypoxia and hypercapnia, CHF exacerbation and pleural effusion.  Reportedly, patient was hypoxic to 60s with witnessed LOC when first responders arrived and had brief CPR.  However, found to have strong pulses when EMS arrived.  In ED, hypothermic, tachypneic with mild hypertension. VBG 7.1/95/75/27 suggesting acute respiratory acidosis.  K3.4. Na 133.  Cr 1.4.  BUN 24.  Hgb 11.  BNP 1360.  Troponin 40.  Lactic acid 3.8.  CXR with small to moderate layering right pleural effusion.  Started on broad-spectrum antibiotics.  Intubated and admitted to ICU.    While in ICU, he had chest tube placement for pleural effusion.  Diuresed with IV Lasix.  TTE with LVEF of 55 to 60%, moderate concentric LVH, D-shaped septum with moderately reduced RVSF, moderate LAE and RAE and severe mitral valve stenosis (not new).   Patient was extubated on 3/19 and transferred to [facility] on 3/21 on room air.  Therapy recommending SNF.  Medically stable for discharge.  Subjective: Seen and examined earlier this morning.  No major events overnight of this morning.  No complaints.  Denies chest pain, shortness of breath or cough.  Denies GI or UTI symptoms.  Objective: Vitals:   02/08/24 0439 02/08/24 0832 02/08/24 0843 02/08/24 1251  BP: 110/62 (!) 130/53  (!) 101/46  Pulse: 61 (!) 59  60  Resp: 18      Temp: 98.5 F (36.9 C) 98.8 F (37.1 C)  99.5 F (37.5 C)  TempSrc: Oral Oral  Oral  SpO2: 94% 93% 92% 96%  Weight:      Height:        Examination:  GENERAL: No apparent distress.  Nontoxic. HEENT: MMM.  Vision and hearing grossly intact.  NECK: Supple.  No apparent JVD.  RESP:  No IWOB.  Fair aeration bilaterally but limited exam CVS:  RRR. Heart sounds normal.  ABD/GI/GU: BS+. Abd soft, NTND.  MSK/EXT:  Moves extremities.  Right TMA.  BLE edema/venous insufficiency.  Faint DP pulses. SKIN: Lower extremity skin changes in keeping with venous sufficiency/chronic PAD. NEURO: Awake and alert.  Oriented x 4.  No apparent focal neurodeficit. PSYCH: Calm. Normal affect.   Consultants:  Critical care admitted patient  Procedures: Intubation and mechanical ventilation Right chest tube   Microbiology summarized: COVID-19, influenza and RSV PCR nonreactive MRSA PCR screen nonreactive Blood cultures NGTD Respiratory culture normal respiratory flora Right pleural fluid culture NGTD  Assessment and plan: Acute hypoxic and hypercapnic respiratory failure: Present on arrival.  Multifactorial including CHF, underlying COPD, OSA, polypharmacy, encephalopathy and pleural effusion.  Resolved. -Treat treatable causes -Minimize sedating medications. -Continue Dulera twice daily, DuoNebs and mucolytic's as needed.  Acute on chronic biventricular heart failure: TTE with LVEF of 55 to 60%, moderate concentric LVH, D-shaped septum with moderately reduced RVSF, moderate LAE and RAE and severe mitral valve stenosis (not new).  BNP  elevated to 1360.  CXR with mild to moderate right pleural effusion.  Diuresed with IV Lasix.  Restarted on p.o. Lasix.  -Continue p.o. Lasix 40 mg daily -Started Farxiga 10 g daily on 3/22. -Strict intake and output, daily weight, renal functions and electrolytes  Right pleural effusion: Chest x-ray showed mild to moderate pleural effusion.  Likely due to  CHF. -S/p chest tube and diuretics.  Fluid transudative and culture negative. -Manage CHF as above  Untreated OSA: Patient and family does not recall diagnosis of OSA but his sleep study in 2017 was positive.  Not able to access his spirometry result at the same time.  Not on CPAP or inhalers at home. -Refused trial of CPAP here.  IDDM-2 with hyperglycemia: A1c 7.2%. Recent Labs  Lab 02/07/24 1544 02/07/24 2018 02/08/24 0043 02/08/24 0832 02/08/24 1150  GLUCAP 249* 172* 153* 180* 176*  -Increase SSI to moderate -Continue Farxiga.  AKI: Baseline Cr ~0.9-1.0.  AKI resolved. Recent Labs    01/29/24 1500 01/29/24 1515 01/30/24 0350 01/31/24 0408 02/01/24 0315 02/02/24 0359 02/03/24 0602 02/04/24 0656 02/05/24 0737 02/06/24 1127  BUN 25* 24* 20 21 18 17 22 20 17 15   CREATININE 1.48* 1.40* 1.22 1.23 1.15 1.11 1.35* 1.12 1.13 1.18  -Continue monitoring.  Follow morning labs  Normocytic anemia: Likely due to chronic disease and acute illness.  No overt bleeding.  He is on Xarelto.  H&H stable now.  Anemia panel with iron deficiency Recent Labs    01/29/24 1550 01/29/24 2117 01/30/24 0350 01/31/24 0000 01/31/24 0408 02/01/24 0315 02/02/24 0359 02/03/24 0602 02/04/24 0656 02/06/24 1127  HGB 10.9* 9.6* 9.9* 9.5* 9.3* 9.0* 8.4* 8.2* 8.8* 10.1*  -Continue monitoring -Received IV Venofer 300 mg on 3/24.  Atrial flutter anticoagulated with Xarelto at baseline -Continue home Xarelto. -Toprol-XL on hold due to borderline bradycardia. -Optimize electrolytes  Bilateral PAD/possible cellulitis: ABI in 03/2023 with noncompressible lower extremity arthritis bilaterally.  He seems to have chronic venous insufficiency as well.  He is on Xarelto.  He is LDL is 114 in 03/2023.  LDL 102.  He is not on a statin. -Continue doxycycline and ceftriaxone until 3/24. -Continue Xarelto -Continue Lipitor.  Chronic pain on opiate-Per narcotic database, he had prescription for oxycodone in  February and tramadol and December.  Denies current use.   Currently not in significant pain or discomfort. -Tylenol as needed for pain -Avoid opiates.  Hyponatremia: Resolved  Physical deconditioning: Daughter reports significant physical deconditioning lately.  -PT/OT recommended SNF.   Class I obesity Body mass index is 33.12 kg/m. Nutrition Problem: Inadequate oral intake Etiology: inability to eat Signs/Symptoms: NPO status Interventions: Refer to RD note for recommendations   DVT prophylaxis:  rivaroxaban (XARELTO) tablet 20 mg Start: 01/31/24 1700 rivaroxaban (XARELTO) tablet 20 mg  Code Status: Full code Family Communication: None at bedside Level of care: Telemetry Medical Status is: Inpatient Remains inpatient appropriate because: Safe disposition/SNF.   Final disposition: SNF.  Medically stable for discharge.   35 minutes with more than 50% spent in reviewing records, counseling patient/family and coordinating care.   Sch Meds:  Scheduled Meds:  atorvastatin  40 mg Oral Daily   dapagliflozin propanediol  10 mg Oral Daily   feeding supplement  237 mL Oral Q24H   furosemide  40 mg Oral Daily   guaiFENesin  600 mg Oral BID   insulin aspart  0-5 Units Subcutaneous QHS   insulin aspart  0-9 Units Subcutaneous TID WC   mometasone-formoterol  2 puff Inhalation BID   polyethylene glycol  17 g Oral BID   rivaroxaban  20 mg Oral Q supper   senna-docusate  1 tablet Oral BID   Continuous Infusions:   PRN Meds:.docusate sodium, ipratropium-albuterol, mouth rinse, polyethylene glycol  Antimicrobials: Anti-infectives (From admission, onward)    Start     Dose/Rate Route Frequency Ordered Stop   02/02/24 1000  doxycycline (VIBRA-TABS) tablet 100 mg        100 mg Oral Every 12 hours 02/01/24 2114 02/05/24 2230   01/31/24 2200  doxycycline (VIBRA-TABS) tablet 100 mg  Status:  Discontinued        100 mg Per Tube Every 12 hours 01/31/24 1452 02/01/24 2114   01/30/24  1000  cefTRIAXone (ROCEPHIN) 2 g in sodium chloride 0.9 % 100 mL IVPB        2 g 200 mL/hr over 30 Minutes Intravenous Every 24 hours 01/29/24 1659 02/05/24 1010   01/30/24 1000  doxycycline (VIBRAMYCIN) 100 mg in sodium chloride 0.9 % 250 mL IVPB  Status:  Discontinued        100 mg 125 mL/hr over 120 Minutes Intravenous Every 12 hours 01/29/24 1659 01/31/24 1452   01/29/24 1715  linezolid (ZYVOX) IVPB 600 mg  Status:  Discontinued        600 mg 300 mL/hr over 60 Minutes Intravenous Every 12 hours 01/29/24 1704 01/30/24 1106   01/29/24 1530  cefTRIAXone (ROCEPHIN) 2 g in sodium chloride 0.9 % 100 mL IVPB        2 g 200 mL/hr over 30 Minutes Intravenous Once 01/29/24 1529 01/29/24 1728   01/29/24 1530  azithromycin (ZITHROMAX) 500 mg in sodium chloride 0.9 % 250 mL IVPB        500 mg 250 mL/hr over 60 Minutes Intravenous  Once 01/29/24 1529 01/29/24 1738        I have personally reviewed the following labs and images: CBC: Recent Labs  Lab 02/02/24 0359 02/03/24 0602 02/04/24 0656 02/06/24 1127  WBC 8.2 5.4 4.6 5.3  HGB 8.4* 8.2* 8.8* 10.1*  HCT 28.0* 27.4* 28.8* 33.6*  MCV 87.8 87.5 87.0 89.6  PLT 221 230 228 327   BMP &GFR Recent Labs  Lab 02/02/24 0359 02/03/24 0602 02/04/24 0656 02/05/24 0737 02/06/24 1127  NA 139 139 140 140 139  K 3.9 3.9 3.6 3.7 3.6  CL 99 97* 100 103 102  CO2 26 28 29 27 27   GLUCOSE 150* 152* 169* 139* 193*  BUN 17 22 20 17 15   CREATININE 1.11 1.35* 1.12 1.13 1.18  CALCIUM 8.5* 8.8* 8.7* 9.0 9.3  MG 2.0 2.1 2.1 2.0 2.2  PHOS 3.2 3.4 3.2 3.5 4.0   Estimated Creatinine Clearance: 72.3 mL/min (by C-G formula based on SCr of 1.18 mg/dL). Liver & Pancreas: Recent Labs  Lab 02/04/24 0656 02/05/24 0737 02/06/24 1127  ALBUMIN 2.9* 2.8* 3.4*   No results for input(s): "LIPASE", "AMYLASE" in the last 168 hours. No results for input(s): "AMMONIA" in the last 168 hours. Diabetic: No results for input(s): "HGBA1C" in the last 72  hours. Recent Labs  Lab 02/07/24 1544 02/07/24 2018 02/08/24 0043 02/08/24 0832 02/08/24 1150  GLUCAP 249* 172* 153* 180* 176*   Cardiac Enzymes: No results for input(s): "CKTOTAL", "CKMB", "CKMBINDEX", "TROPONINI" in the last 168 hours. No results for input(s): "PROBNP" in the last 8760 hours. Coagulation Profile: No results for input(s): "INR", "PROTIME" in the last 168 hours.  Thyroid Function Tests: No results for  input(s): "TSH", "T4TOTAL", "FREET4", "T3FREE", "THYROIDAB" in the last 72 hours. Lipid Profile: No results for input(s): "CHOL", "HDL", "LDLCALC", "TRIG", "CHOLHDL", "LDLDIRECT" in the last 72 hours.  Anemia Panel: Recent Labs    02/06/24 1127  VITAMINB12 783  FOLATE 21.9  FERRITIN 62  TIBC 412  IRON 27*  RETICCTPCT 3.2*   Urine analysis:    Component Value Date/Time   COLORURINE AMBER (A) 01/29/2024 1529   APPEARANCEUR HAZY (A) 01/29/2024 1529   LABSPEC 1.019 01/29/2024 1529   PHURINE 5.0 01/29/2024 1529   GLUCOSEU NEGATIVE 01/29/2024 1529   GLUCOSEU >=1000 (A) 03/29/2023 1412   HGBUR NEGATIVE 01/29/2024 1529   BILIRUBINUR NEGATIVE 01/29/2024 1529   BILIRUBINUR neg 03/21/2018 0940   KETONESUR NEGATIVE 01/29/2024 1529   PROTEINUR 100 (A) 01/29/2024 1529   UROBILINOGEN 0.2 03/29/2023 1412   NITRITE NEGATIVE 01/29/2024 1529   LEUKOCYTESUR NEGATIVE 01/29/2024 1529   Sepsis Labs: Invalid input(s): "PROCALCITONIN", "LACTICIDVEN"  Microbiology: Recent Results (from the past 240 hours)  Resp panel by RT-PCR (RSV, Flu A&B, Covid) Urine, Catheterized     Status: None   Collection Time: 01/29/24  3:29 PM   Specimen: Urine, Catheterized; Nasal Swab  Result Value Ref Range Status   SARS Coronavirus 2 by RT PCR NEGATIVE NEGATIVE Final   Influenza A by PCR NEGATIVE NEGATIVE Final   Influenza B by PCR NEGATIVE NEGATIVE Final    Comment: (NOTE) The Xpert Xpress SARS-CoV-2/FLU/RSV plus assay is intended as an aid in the diagnosis of influenza from  Nasopharyngeal swab specimens and should not be used as a sole basis for treatment. Nasal washings and aspirates are unacceptable for Xpert Xpress SARS-CoV-2/FLU/RSV testing.  Fact Sheet for Patients: BloggerCourse.com  Fact Sheet for Healthcare Providers: SeriousBroker.it  This test is not yet approved or cleared by the [place] FDA and has been authorized for detection and/or diagnosis of SARS-CoV-2 by FDA under an Emergency Use Authorization (EUA). This EUA will remain in effect (meaning this test can be used) for the duration of the COVID-19 declaration under Section 564(b)(1) of the Act, 21 U.S.C. section 360bbb-3(b)(1), unless the authorization is terminated or revoked.     Resp Syncytial Virus by PCR NEGATIVE NEGATIVE Final    Comment: (NOTE) Fact Sheet for Patients: BloggerCourse.com  Fact Sheet for Healthcare Providers: SeriousBroker.it  This test is not yet approved or cleared by the [place] FDA and has been authorized for detection and/or diagnosis of SARS-CoV-2 by FDA under an Emergency Use Authorization (EUA). This EUA will remain in effect (meaning this test can be used) for the duration of the COVID-19 declaration under Section 564(b)(1) of the Act, 21 U.S.C. section 360bbb-3(b)(1), unless the authorization is terminated or revoked.  Performed at [facility], [address], [place], [place] [address]   Blood Culture (routine x 2)     Status: None   Collection Time: 01/29/24  3:29 PM   Specimen: BLOOD  Result Value Ref Range Status   Specimen Description BLOOD LEFT ANTECUBITAL  Final   Special Requests   Final    BOTTLES DRAWN AEROBIC AND ANAEROBIC Blood Culture results may not be optimal due to an inadequate volume of blood received in culture bottles   Culture   Final    NO GROWTH 5 DAYS Performed at [facility], [address] [place], [place] [address]    Report Status 02/03/2024 FINAL  Final  MRSA Next Gen by PCR, Nasal     Status: None  Collection Time: 01/29/24  5:10 PM   Specimen: Pleural; Nasal Swab  Result Value Ref Range Status   MRSA by PCR Next Gen NOT DETECTED NOT DETECTED Final    Comment: (NOTE) The GeneXpert MRSA Assay (FDA approved for NASAL specimens only), is one component of a comprehensive MRSA colonization surveillance program. It is not intended to diagnose MRSA infection nor to guide or monitor treatment for MRSA infections. Test performance is not FDA approved in patients less than 73 years old. Performed at [facility], [address], [place], [place] [address]   Culture, Respiratory w Gram Stain     Status: None   Collection Time: 01/29/24  5:11 PM   Specimen: Tracheal Aspirate; Respiratory  Result Value Ref Range Status   Specimen Description TRACHEAL ASPIRATE  Final   Special Requests NONE  Final   Gram Stain   Final    FEW WBC PRESENT, PREDOMINANTLY PMN FEW SQUAMOUS EPITHELIAL CELLS PRESENT ABUNDANT GRAM POSITIVE COCCI FEW GRAM NEGATIVE RODS    Culture   Final    ABUNDANT Normal respiratory flora-no Staph aureus or Pseudomonas seen Performed at [facility], [address], [place], [place] [address]    Report Status 02/01/2024 FINAL  Final  Body fluid culture w Gram Stain     Status: None   Collection Time: 01/29/24  6:00 PM   Specimen: Pleural Fluid  Result Value Ref Range Status   Specimen Description PLEURAL  Final   Special Requests RIGHT  Final   Gram Stain NO WBC SEEN NO ORGANISMS SEEN   Final   Culture   Final    NO GROWTH 3 DAYS Performed at [facility], [address], [place], [place] [address]    Report Status 02/02/2024 FINAL  Final  Blood Culture (routine x 2)     Status: None   Collection Time: 01/29/24  7:45 PM   Specimen: BLOOD  Result Value Ref Range Status   Specimen Description BLOOD SITE NOT SPECIFIED  Final   Special  Requests   Final    BOTTLES DRAWN AEROBIC AND ANAEROBIC Blood Culture adequate volume   Culture   Final    NO GROWTH 5 DAYS Performed at [facility], [address], [place], [place] [address]    Report Status 02/03/2024 FINAL  Final    Radiology Studies: No results found.    Tymeir Weathington T. Genevive Printup [facility]  If 7PM-7AM, please contact night-coverage [email or web address] 02/08/2024, 2:29 PM

## 2024-02-08 NOTE — TOC Progression Note (Signed)
 Transition of Care [facility]) - Progression Note    Patient Details  Name: Jason [name] MRN: [id number] Date of Birth: 1941/07/01  [facility] CM/SW Contact  Sheriann Newmann A [place], LCSW Phone Number: 02/08/2024, 1:18 PM  Clinical Narrative:     CSW was contacted by Aundra Millet at [facility] rehab. She informed CSW that facility started pt's authorization. Status pending. CSW provided updated FL2. Can DC once authorization is approved.    TOC will continue to follow.   Expected Discharge Plan: Skilled Nursing Facility Barriers to Discharge: Continued Medical Work up, SNF Pending bed offer  Expected Discharge Plan and Services In-house Referral: Clinical Social Work   Post Acute Care Choice: Skilled Nursing Facility Living arrangements for the past 2 months: Single Family Home                                       Social Determinants of Health (SDOH) Interventions SDOH Screenings   Food Insecurity: No Food Insecurity (04/26/2023)  Housing: Low Risk  (02/01/2024)  Transportation Needs: Unmet Transportation Needs (02/01/2024)  Utilities: Not At Risk (02/01/2024)  Alcohol Screen: Low Risk  (06/01/2022)  Depression (PHQ2-9): Low Risk  (06/01/2022)  Financial Resource Strain: Low Risk  (06/01/2022)  Physical Activity: Sufficiently Active (06/01/2022)  Social Connections: Socially Isolated (02/01/2024)  Stress: Stress Concern Present (06/01/2022)  Tobacco Use: Medium Risk (01/29/2024)    Readmission Risk Interventions    04/30/2023   12:25 PM  Readmission Risk Prevention Plan  Transportation Screening Complete  PCP or Specialist Appt within 3-5 Days Complete  HRI or Home Care Consult Complete  Social Work Consult for Recovery Care Planning/Counseling Complete  Palliative Care Screening Complete  Medication Review Oceanographer) Complete

## 2024-02-08 NOTE — Plan of Care (Signed)
   Problem: Activity: Goal: Risk for activity intolerance will decrease Outcome: Progressing   Problem: Nutrition: Goal: Adequate nutrition will be maintained Outcome: Progressing   Problem: Coping: Goal: Level of anxiety will decrease Outcome: Progressing   Problem: Elimination: Goal: Will not experience complications related to bowel motility Outcome: Progressing Goal: Will not experience complications related to urinary retention Outcome: Progressing

## 2024-02-08 NOTE — Progress Notes (Signed)
 Incentive spiromerty introduced with teach back.

## 2024-02-08 NOTE — Progress Notes (Signed)
 Physical Therapy Treatment [name] MRN: [id number] DOB: 07-Oct-1941 Today's Date: 02/08/2024   History of Present Illness 83 yo male admitted 3/16 with acute respiratory distress, AMS, hypoxic, Acutely decompensated HFpEF. Intubated 3/16-3/19. PMhx: COPD, HTN, HLD, HFpEF, sleep apnea, T2DM, AFib, PAD, Rt transmet amputation    Jason [name] Comments  Jason [name] in bed upon arrival and agreeable to Jason [name] session. Jason [name] required MinA to roll and ModA to move from sidelying to sit. Jason [name] was able to stand with ModAx2 and use of bed pad to boost up from an elevated surface. Once in standing, Jason [name] was able to maintain balance with CGA for safety. Jason [name] was then able to perform step-pivot transfer to the [facility]. Attempted to come back twice to continue Jason [name] session with Jason [name] needing more time. Jason [name] with call bell in reach and NT aware. Jason [name] would continue to benefit from <3hrs post acute rehab to work towards independence with mobility. Acute Jason [name] to follow.      If plan is discharge home, recommend the following: A lot of help with walking and/or transfers;A lot of help with bathing/dressing/bathroom;Help with stairs or ramp for entrance;Assist for transportation   Can travel by private vehicle     No  Equipment Recommendations  BSC/3in1       Precautions / Restrictions Precautions Precautions: Fall Restrictions Weight Bearing Restrictions Per Provider Order: No     Mobility  Bed Mobility Overal bed mobility: Needs Assistance Bed Mobility: Sidelying to Sit, Rolling Rolling: Min assist, Used rails Sidelying to sit: Mod assist, Used rails     General bed mobility comments: Jason [name] able to move LE's off EOB, MinA to roll and ModA to assist with elevating trunk    Transfers Overall transfer level: Needs assistance Equipment used: Rolling walker (2 wheels) Transfers: Sit to/from Stand, Bed to chair/wheelchair/BSC Sit to Stand: Mod assist, From elevated surface, +2 physical assistance, +2 safety/equipment    Step pivot transfers: Mod assist, +2 safety/equipment      General transfer comment: ModAx2 with use of bed pad to stand from elevated surface. Steady once in standing with CGA for balance. Jason [name] then able to perform step-pivot towards BSC.     Balance Overall balance assessment: Needs assistance, History of Falls Sitting-balance support: No upper extremity supported, Feet supported Sitting balance-Leahy Scale: Fair     Standing balance support: Bilateral upper extremity supported, During functional activity, Reliant on assistive device for balance Standing balance-Leahy Scale: Poor Standing balance comment: reliant on RW       Communication Communication Communication: No apparent difficulties Factors Affecting Communication: Reduced clarity of speech  Cognition Arousal: Alert Behavior During Therapy: WFL for tasks assessed/performed   Jason [name] - Cognitive impairments: Safety/Judgement, Problem solving    Following commands: Intact      Cueing Cueing Techniques: Verbal cues, Tactile cues     General Comments General comments (skin integrity, edema, etc.): VSS on RA      Pertinent Vitals/Pain Pain Assessment Pain Assessment: No/denies pain     Jason [name] Goals (current goals can now be found in the care plan section) Acute Rehab Jason [name] Goals Jason [name] Goal Formulation: With patient Time For Goal Achievement: 02/16/24 Potential to Achieve Goals: Good Progress towards Jason [name] goals: Progressing toward goals    Frequency    Min 2X/week       AM-PAC Jason [name] "6 Clicks" Mobility   Outcome Measure  Help needed turning from your back to your side while in a flat bed without using bedrails?: A Little  Help needed moving from lying on your back to sitting on the side of a flat bed without using bedrails?: A Lot Help needed moving to and from a bed to a chair (including a wheelchair)?: Total Help needed standing up from a chair using your arms (e.g., wheelchair or bedside chair)?: Total Help needed to  walk in hospital room?: Total Help needed climbing 3-5 steps with a railing? : Total 6 Click Score: 9    End of Session Equipment Utilized During Treatment: Gait belt Activity Tolerance: Patient tolerated treatment well Patient left: Other (comment);with call bell/phone within reach (on [facility]) Nurse Communication: Mobility status (let NT know Jason [name] is on [facility]) Jason [name] Visit Diagnosis: Other abnormalities of gait and mobility (R26.89);Difficulty in walking, not elsewhere classified (R26.2);Muscle weakness (generalized) (M62.81)     Time: 1610-9604 Jason [name] Time Calculation (min) (ACUTE ONLY): 17 min  Charges:    $Therapeutic Activity: 8-22 mins Jason [name] General Charges $$ ACUTE Jason [name] VISIT: 1 Visit           Jason [name], Jason [name], Jason [name] Secure Chat Preferred  Rehab Office [phone]    Jason [name] 02/08/2024, 3:56 PM

## 2024-02-08 NOTE — Plan of Care (Signed)
  Problem: Education: Goal: Knowledge of General Education information will improve Description: Including pain rating scale, medication(s)/side effects and non-pharmacologic comfort measures Outcome: Progressing   Problem: Health Behavior/Discharge Planning: Goal: Ability to manage health-related needs will improve Outcome: Progressing   Problem: Clinical Measurements: Goal: Will remain free from infection Outcome: Progressing Goal: Respiratory complications will improve Outcome: Progressing Goal: Cardiovascular complication will be avoided Outcome: Progressing   Problem: Activity: Goal: Risk for activity intolerance will decrease Outcome: Progressing   Problem: Nutrition: Goal: Adequate nutrition will be maintained Outcome: Progressing   Problem: Coping: Goal: Level of anxiety will decrease Outcome: Progressing   Problem: Elimination: Goal: Will not experience complications related to bowel motility Outcome: Progressing Goal: Will not experience complications related to urinary retention Outcome: Progressing   Problem: Safety: Goal: Ability to remain free from injury will improve Outcome: Progressing   Problem: Skin Integrity: Goal: Risk for impaired skin integrity will decrease Outcome: Progressing   Problem: Fluid Volume: Goal: Ability to maintain a balanced intake and output will improve Outcome: Progressing   Problem: Health Behavior/Discharge Planning: Goal: Ability to identify and utilize available resources and services will improve Outcome: Progressing Goal: Ability to manage health-related needs will improve Outcome: Progressing   Problem: Nutritional: Goal: Maintenance of adequate nutrition will improve Outcome: Progressing

## 2024-02-09 DIAGNOSIS — E1151 Type 2 diabetes mellitus with diabetic peripheral angiopathy without gangrene: Secondary | ICD-10-CM | POA: Diagnosis not present

## 2024-02-09 DIAGNOSIS — J9601 Acute respiratory failure with hypoxia: Secondary | ICD-10-CM | POA: Diagnosis not present

## 2024-02-09 DIAGNOSIS — I4892 Unspecified atrial flutter: Secondary | ICD-10-CM | POA: Diagnosis not present

## 2024-02-09 DIAGNOSIS — J42 Unspecified chronic bronchitis: Secondary | ICD-10-CM | POA: Diagnosis not present

## 2024-02-09 LAB — GLUCOSE, CAPILLARY
Glucose-Capillary: 180 mg/dL — ABNORMAL HIGH (ref 70–99)
Glucose-Capillary: 264 mg/dL — ABNORMAL HIGH (ref 70–99)
Glucose-Capillary: 180 mg/dL — ABNORMAL HIGH (ref 70–99)
Glucose-Capillary: 179 mg/dL — ABNORMAL HIGH (ref 70–99)

## 2024-02-09 MED ORDER — INSULIN GLARGINE-YFGN 100 UNIT/ML ~~LOC~~ SOLN
10.0000 [IU] | Freq: Every day | SUBCUTANEOUS | Status: DC
  Administered 2024-02-09: 10 [IU] via SUBCUTANEOUS
  Filled 2024-02-09 (×2): qty 0.1

## 2024-02-09 MED ORDER — INSULIN ASPART 100 UNIT/ML IJ SOLN
3.0000 [IU] | Freq: Three times a day (TID) | INTRAMUSCULAR | Status: DC
  Administered 2024-02-09: 3 [IU] via SUBCUTANEOUS

## 2024-02-09 MED ORDER — INSULIN GLARGINE-YFGN 100 UNIT/ML ~~LOC~~ SOPN
10.0000 [IU] | PEN_INJECTOR | Freq: Every day | SUBCUTANEOUS | Status: DC
  Filled 2024-02-09: qty 3

## 2024-02-09 NOTE — Plan of Care (Signed)

## 2024-02-09 NOTE — TOC Progression Note (Addendum)
 Transition of Care [facility]) - Progression Note    Patient Details  Name: Jason [name] MRN: [id number] Date of Birth: 1941-03-29  Transition of Care [facility]) CM/SW Contact  Ely Ballen A [place], LCSW Phone Number: 02/09/2024, 10:40 AM  Clinical Narrative:      Update 1641 CSW spoke with pt's daughter, [name], she asked for CSW to send updated bed offers at [place] facilities. She requested CSW to reach out to [place] for possible bed placement versus [employer]. Authorization is still pending.    Update 1133 CSW spoke with pt's daughter [name] at bedside. Informed her of pt's authorization pending. She said was coordinating transportation services but needed assistance getting wheelchair for wheelchair [place] services. CSW provided contact # for Aundra Millet at [place], who said she would discuss with daughter options for renting wheelchair from facility or share [employer] transportation service support. CSW was also informed bed available on Sunday/Monday.   CSW received call from [place] at [place]. She stated that pt's authorization is still pending. She said insurance is requesting additional clinicals. CSW sent updates PT/OT via secure email to assist with pending authorization.    TOC will continue to follow.   Expected Discharge Plan: Skilled Nursing Facility Barriers to Discharge: Continued Medical Work up, SNF Pending bed offer  Expected Discharge Plan and Services In-house Referral: Clinical Social Work   Post Acute Care Choice: Skilled Nursing Facility Living arrangements for the past 2 months: Single Family Home                                       Social Determinants of Health (SDOH) Interventions SDOH Screenings   Food Insecurity: No Food Insecurity (04/26/2023)  Housing: Low Risk  (02/01/2024)  Transportation Needs: Unmet Transportation Needs (02/01/2024)  Utilities: Not At Risk (02/01/2024)  Alcohol Screen: Low Risk  (06/01/2022)  Depression (PHQ2-9): Low Risk   (06/01/2022)  Financial Resource Strain: Low Risk  (06/01/2022)  Physical Activity: Sufficiently Active (06/01/2022)  Social Connections: Socially Isolated (02/01/2024)  Stress: Stress Concern Present (06/01/2022)  Tobacco Use: Medium Risk (01/29/2024)    Readmission Risk Interventions    04/30/2023   12:25 PM  Readmission Risk Prevention Plan  Transportation Screening Complete  PCP or Specialist Appt within 3-5 Days Complete  HRI or Home Care Consult Complete  Social Work Consult for Recovery Care Planning/Counseling Complete  Palliative Care Screening Complete  Medication Review [employer]) Complete

## 2024-02-09 NOTE — Progress Notes (Signed)
 PROGRESS NOTE  Jason [name]:[id number] DOB: 1941-10-21   PCP: [name], MD  Patient is from: Home.  Lives with wife who has dementia.  Has caregiver.   DOA: 01/29/2024 LOS: 11  Chief complaints Chief Complaint  Patient presents with   Respiratory Distress   Altered Mental Status     Brief Narrative / Interim history: 83 year old M with PMH of diastolic CHF, sev mitral valve stenosis, OSA, atrial flutter on Xarelto, DM-2, HTN, HLD, PAD and chronic pain brought to ED due to altered mental status and respiratory distress, and admitted to ICU with acute on chronic respiratory failure with hypoxia and hypercapnia, CHF exacerbation and pleural effusion.  Reportedly, patient was hypoxic to 60s with witnessed LOC when first responders arrived and had brief CPR.  However, found to have strong pulses when EMS arrived.  In ED, hypothermic, tachypneic with mild hypertension. VBG 7.1/95/75/27 suggesting acute respiratory acidosis.  K3.4. Na 133.  Cr 1.4.  BUN 24.  Hgb 11.  BNP 1360.  Troponin 40.  Lactic acid 3.8.  CXR with small to moderate layering right pleural effusion.  Started on broad-spectrum antibiotics.  Intubated and admitted to ICU.    While in ICU, he had chest tube placement for pleural effusion.  Diuresed with IV Lasix.  TTE with LVEF of 55 to 60%, moderate concentric LVH, D-shaped septum with moderately reduced RVSF, moderate LAE and RAE and severe mitral valve stenosis (not new).   Patient was extubated on 3/19 and transferred to [facility] hospital[facility] service on 3/21 on room air.  Therapy recommending SNF.  Medically stable for discharge.  Subjective: Seen and examined earlier this morning.  No major events overnight of this morning.  No complaints.  Patient's daughter at bedside.  Objective: Vitals:   02/08/24 2339 02/09/24 0350 02/09/24 0845 02/09/24 1128  BP: (!) 115/53 (!) 120/58 127/64 125/67  Pulse: (!) 55 (!) 55 72 62  Resp: 19 20 18 17   Temp: 98 F (36.7 C) 97.9 F  (36.6 C) 98.4 F (36.9 C) 98.6 F (37 C)  TempSrc: Oral Oral Oral Oral  SpO2: 98% 96%  98%  Weight:  126.6 kg    Height:        Examination:  GENERAL: No apparent distress.  Nontoxic. HEENT: MMM.  Vision and hearing grossly intact.  NECK: Supple.  No apparent JVD.  RESP:  No IWOB.  Fair aeration bilaterally but limited exam CVS:  RRR. Heart sounds normal.  ABD/GI/GU: BS+. Abd soft, NTND.  MSK/EXT:  Moves extremities.  Right TMA.  BLE edema/venous insufficiency.  Faint DP pulses. SKIN: Lower extremity skin changes in keeping with venous sufficiency/chronic PAD. NEURO: Awake, alert and oriented appropriately.  No apparent focal neurodeficit. PSYCH: Calm. Normal affect.   Consultants:  Critical care admitted patient  Procedures: Intubation and mechanical ventilation Right chest tube   Microbiology summarized: COVID-19, influenza and RSV PCR nonreactive MRSA PCR screen nonreactive Blood cultures NGTD Respiratory culture normal respiratory flora Right pleural fluid culture NGTD  Assessment and plan: Acute hypoxic and hypercapnic respiratory failure: Present on arrival.  Multifactorial including CHF, underlying COPD, OSA, polypharmacy, encephalopathy and pleural effusion.  Resolved. -Treat treatable causes -Minimize sedating medications. -Continue Dulera twice daily, DuoNebs and mucolytic's as needed.  Acute on chronic biventricular heart failure: TTE with LVEF of 55 to 60%, moderate concentric LVH, D-shaped septum with moderately reduced RVSF, moderate LAE and RAE and severe mitral valve stenosis (not new).  BNP elevated to 1360.  CXR with mild  to moderate right pleural effusion.  Diuresed with IV Lasix.  Restarted on p.o. Lasix.  -Continue p.o. Lasix 40 mg daily -Started Farxiga 10 g daily on 3/22. -Strict intake and output, daily weight, renal functions and electrolytes  Right pleural effusion: Chest x-ray showed mild to moderate pleural effusion.  Likely due to  CHF. -S/p chest tube and diuretics.  Fluid transudative and culture negative. -Manage CHF as above  Untreated OSA: Patient and family does not recall diagnosis of OSA but his sleep study in 2017 was positive.  Not able to access his spirometry result at the same time.  Not on CPAP or inhalers at home. -Refused trial of CPAP here.  IDDM-2 with hyperglycemia: A1c 7.2%. Recent Labs  Lab 02/08/24 1150 02/08/24 1628 02/08/24 2011 02/09/24 0836 02/09/24 1129  GLUCAP 176* 198* 243* 180* 264*  -Continue SSI-moderate -Add NovoLog 3 units 3 times daily with meals -Add Semglee 10 units daily today -Continue Farxiga.  AKI: Baseline Cr ~0.9-1.0.  AKI resolved. Recent Labs    01/29/24 1515 01/30/24 0350 01/31/24 0408 02/01/24 0315 02/02/24 0359 02/03/24 0602 02/04/24 0656 02/05/24 0737 02/06/24 1127 02/08/24 1808  BUN 24* 20 21 18 17 22 20 17 15 15   CREATININE 1.40* 1.22 1.23 1.15 1.11 1.35* 1.12 1.13 1.18 1.21  -Monitor intermittently  Normocytic anemia: Likely due to chronic disease and acute illness.  No overt bleeding.  He is on Xarelto.  H&H stable now.  Anemia panel with iron deficiency.  Received IV Venofer 300 mg on 3/24 Recent Labs    01/29/24 2117 01/30/24 0350 01/31/24 0000 01/31/24 0408 02/01/24 0315 02/02/24 0359 02/03/24 0602 02/04/24 0656 02/06/24 1127 02/08/24 1808  HGB 9.6* 9.9* 9.5* 9.3* 9.0* 8.4* 8.2* 8.8* 10.1* 9.4*  -Monitor intermittently  Atrial flutter anticoagulated with Xarelto at baseline -Continue home Xarelto. -Toprol-XL on hold due to borderline bradycardia. -Optimize electrolytes  Bilateral PAD/possible cellulitis: ABI in 03/2023 with noncompressible lower extremity arthritis bilaterally.  He seems to have chronic venous insufficiency as well.  He is on Xarelto.  He is LDL is 114 in 03/2023.  LDL 102.  He is not on a statin. -Completed antibiotic course with doxycycline CTX on 3/24 -Continue Xarelto and Lipitor.  Hesitant to add aspirin with  his anemia  Chronic pain on opiate-Per narcotic database, he had prescription for oxycodone in February and tramadol and December.  Denies current use.   Currently not in significant pain or discomfort. -Tylenol as needed for pain -Avoid opiates.  Physical deconditioning: Daughter reports significant physical deconditioning lately.  -PT/OT recommended SNF.  Hyponatremia: Resolved  Class I obesity Body mass index is 33.1 kg/m. Nutrition Problem: Inadequate oral intake Etiology: inability to eat Signs/Symptoms: NPO status Interventions: Refer to RD note for recommendations   DVT prophylaxis:  rivaroxaban (XARELTO) tablet 20 mg Start: 01/31/24 1700 rivaroxaban (XARELTO) tablet 20 mg  Code Status: Full code Family Communication: Updated patient's daughter at bedside Level of care: Telemetry Medical Status is: Inpatient Remains inpatient appropriate because: Safe disposition/SNF.   Final disposition: SNF.  Medically stable for discharge.   35 minutes with more than 50% spent in reviewing records, counseling patient/family and coordinating care.   Sch Meds:  Scheduled Meds:  atorvastatin  40 mg Oral Daily   dapagliflozin propanediol  10 mg Oral Daily   feeding supplement  237 mL Oral Q24H   furosemide  40 mg Oral Daily   guaiFENesin  600 mg Oral BID   insulin aspart  0-15 Units Subcutaneous TID  WC   insulin aspart  0-5 Units Subcutaneous QHS   insulin glargine-yfgn  10 Units Subcutaneous Daily   mometasone-formoterol  2 puff Inhalation BID   polyethylene glycol  17 g Oral BID   rivaroxaban  20 mg Oral Q supper   senna-docusate  1 tablet Oral BID   Continuous Infusions:   PRN Meds:.docusate sodium, ipratropium-albuterol, mouth rinse, polyethylene glycol  Antimicrobials: Anti-infectives (From admission, onward)    Start     Dose/Rate Route Frequency Ordered Stop   02/02/24 1000  doxycycline (VIBRA-TABS) tablet 100 mg        100 mg Oral Every 12 hours 02/01/24 2114  02/05/24 2230   01/31/24 2200  doxycycline (VIBRA-TABS) tablet 100 mg  Status:  Discontinued        100 mg Per Tube Every 12 hours 01/31/24 1452 02/01/24 2114   01/30/24 1000  cefTRIAXone (ROCEPHIN) 2 g in sodium chloride 0.9 % 100 mL IVPB        2 g 200 mL/hr over 30 Minutes Intravenous Every 24 hours 01/29/24 1659 02/05/24 1010   01/30/24 1000  doxycycline (VIBRAMYCIN) 100 mg in sodium chloride 0.9 % 250 mL IVPB  Status:  Discontinued        100 mg 125 mL/hr over 120 Minutes Intravenous Every 12 hours 01/29/24 1659 01/31/24 1452   01/29/24 1715  linezolid (ZYVOX) IVPB 600 mg  Status:  Discontinued        600 mg 300 mL/hr over 60 Minutes Intravenous Every 12 hours 01/29/24 1704 01/30/24 1106   01/29/24 1530  cefTRIAXone (ROCEPHIN) 2 g in sodium chloride 0.9 % 100 mL IVPB        2 g 200 mL/hr over 30 Minutes Intravenous Once 01/29/24 1529 01/29/24 1728   01/29/24 1530  azithromycin (ZITHROMAX) 500 mg in sodium chloride 0.9 % 250 mL IVPB        500 mg 250 mL/hr over 60 Minutes Intravenous  Once 01/29/24 1529 01/29/24 1738        I have personally reviewed the following labs and images: CBC: Recent Labs  Lab 02/03/24 0602 02/04/24 0656 02/06/24 1127 02/08/24 1808  WBC 5.4 4.6 5.3 5.2  HGB 8.2* 8.8* 10.1* 9.4*  HCT 27.4* 28.8* 33.6* 31.7*  MCV 87.5 87.0 89.6 89.3  PLT 230 228 327 277   BMP &GFR Recent Labs  Lab 02/03/24 0602 02/04/24 0656 02/05/24 0737 02/06/24 1127 02/08/24 1808  NA 139 140 140 139 138  K 3.9 3.6 3.7 3.6 4.4  CL 97* 100 103 102 103  CO2 28 29 27 27 23   GLUCOSE 152* 169* 139* 193* 236*  BUN 22 20 17 15 15   CREATININE 1.35* 1.12 1.13 1.18 1.21  CALCIUM 8.8* 8.7* 9.0 9.3 9.0  MG 2.1 2.1 2.0 2.2 2.1  PHOS 3.4 3.2 3.5 4.0 3.3   Estimated Creatinine Clearance: 70.5 mL/min (by C-G formula based on SCr of 1.21 mg/dL). Liver & Pancreas: Recent Labs  Lab 02/04/24 0656 02/05/24 0737 02/06/24 1127 02/08/24 1808  ALBUMIN 2.9* 2.8* 3.4* 3.1*   No  results for input(s): "LIPASE", "AMYLASE" in the last 168 hours. No results for input(s): "AMMONIA" in the last 168 hours. Diabetic: No results for input(s): "HGBA1C" in the last 72 hours. Recent Labs  Lab 02/08/24 1150 02/08/24 1628 02/08/24 2011 02/09/24 0836 02/09/24 1129  GLUCAP 176* 198* 243* 180* 264*   Cardiac Enzymes: No results for input(s): "CKTOTAL", "CKMB", "CKMBINDEX", "TROPONINI" in the last 168 hours. No results for input(s): "  PROBNP" in the last 8760 hours. Coagulation Profile: No results for input(s): "INR", "PROTIME" in the last 168 hours.  Thyroid Function Tests: No results for input(s): "TSH", "T4TOTAL", "FREET4", "T3FREE", "THYROIDAB" in the last 72 hours. Lipid Profile: No results for input(s): "CHOL", "HDL", "LDLCALC", "TRIG", "CHOLHDL", "LDLDIRECT" in the last 72 hours.  Anemia Panel: No results for input(s): "VITAMINB12", "FOLATE", "FERRITIN", "TIBC", "IRON", "RETICCTPCT" in the last 72 hours.  Urine analysis:    Component Value Date/Time   COLORURINE AMBER (A) 01/29/2024 1529   APPEARANCEUR HAZY (A) 01/29/2024 1529   LABSPEC 1.019 01/29/2024 1529   PHURINE 5.0 01/29/2024 1529   GLUCOSEU NEGATIVE 01/29/2024 1529   GLUCOSEU >=1000 (A) 03/29/2023 1412   HGBUR NEGATIVE 01/29/2024 1529   BILIRUBINUR NEGATIVE 01/29/2024 1529   BILIRUBINUR neg 03/21/2018 0940   KETONESUR NEGATIVE 01/29/2024 1529   PROTEINUR 100 (A) 01/29/2024 1529   UROBILINOGEN 0.2 03/29/2023 1412   NITRITE NEGATIVE 01/29/2024 1529   LEUKOCYTESUR NEGATIVE 01/29/2024 1529   Sepsis Labs: Invalid input(s): "PROCALCITONIN", "LACTICIDVEN"  Microbiology: No results found for this or any previous visit (from the past 240 hours).   Radiology Studies: No results found.    Amberli Ruegg T. Shailah Gibbins [facility]  If 7PM-7AM, please contact night-coverage [email or web address] 02/09/2024, 2:51 PM

## 2024-02-09 NOTE — Progress Notes (Signed)
 Occupational Therapy Treatment [name] MRN: [id number] DOB: 12-04-1940 Today's Date: 02/09/2024   History of present illness 83 yo male admitted 3/16 with acute respiratory distress, AMS, hypoxic, Acutely decompensated HFpEF. Intubated 3/16-3/19. PMhx: COPD, HTN, HLD, HFpEF, sleep apnea, T2DM, AFib, PAD, Rt transmet amputation   OT comments  Pt making good progress towards OT goals. Pt able to progress bed mobility and OOB transfers from elevated surface using RW to Min A. Pt continues to require assist for LB ADLs in standing but demonstrating improving initiation and flexibility for seated LB ADLs. Pt does continue to report limitations in RUE ROM and discomfort- provided heat pack to hopefully ease discomfort. Pt set up in chair awaiting breakfast and assisted to call daughter at end of session. Continue to recommend inpatient follow up therapy, <3 hours/day at DC.       If plan is discharge home, recommend the following:  A lot of help with walking and/or transfers;A lot of help with bathing/dressing/bathroom;Direct supervision/assist for medications management   Equipment Recommendations  Other (comment) (daughter requesting drop arm BSC; TBD)    Recommendations for Other Services      Precautions / Restrictions Precautions Precautions: Fall Restrictions Weight Bearing Restrictions Per Provider Order: No       Mobility Bed Mobility Overal bed mobility: Needs Assistance Bed Mobility: Supine to Sit     Supine to sit: Min assist, HOB elevated, Used rails     General bed mobility comments: pt able to bring LE off of bed, scoot with increased time. MIn A to lift trunk with pt making good effort to pull self up    Transfers Overall transfer level: Needs assistance Equipment used: Rolling walker (2 wheels) Transfers: Sit to/from Stand, Bed to chair/wheelchair/BSC Sit to Stand: Min assist, From elevated surface     Step pivot transfers: Contact  guard assist     General transfer comment: Min A to stand from elevated bed height. pt able to push with LUE w/ light MIn A to gain balance/upright posture. Once up, CGA to step to recliner with slow pacing and cues for sequencing and slow descent into chair     Balance Overall balance assessment: Needs assistance, History of Falls Sitting-balance support: No upper extremity supported, Feet supported Sitting balance-Leahy Scale: Fair     Standing balance support: Bilateral upper extremity supported, During functional activity, Reliant on assistive device for balance Standing balance-Leahy Scale: Poor                             ADL either performed or assessed with clinical judgement   ADL Overall ADL's : Needs assistance/impaired Eating/Feeding: Set up;Sitting Eating/Feeding Details (indicate cue type and reason): assist to open coffee sugar and creamer Grooming: Set up;Sitting;Wash/dry face   Upper Body Bathing: Minimal assistance;Sitting Upper Body Bathing Details (indicate cue type and reason): assist to bathe back. Lower Body Bathing: Moderate assistance;Sitting/lateral leans Lower Body Bathing Details (indicate cue type and reason): able to wash upper LE and partially lower LE with OT assisting with the rest. OT assisted with posterior hygiene in standing  d/t pt balance deficits Upper Body Dressing : Set up;Sitting Upper Body Dressing Details (indicate cue type and reason): gown around back, cued to don affected RUE first                   General ADL Comments: Emphasis on transfer training with RW, body mechanics  and posturing as well as using RUE as able to decrease stiffness    Extremity/Trunk Assessment Upper Extremity Assessment Upper Extremity Assessment: Right hand dominant;RUE deficits/detail RUE Deficits / Details: reports impaired strengthh, ROM and discomfort in this UE for months though unsure why on eval. today,pt attributing to fall 3-4 weeks  ago. grip strength weaker and AAROM shoulder flex to 95*, abduct to 90*. encouraged reaching and self ROM as tolerated to decrease stiffness RUE Coordination: decreased gross motor   Lower Extremity Assessment Lower Extremity Assessment: Defer to PT evaluation        Vision   Vision Assessment?: No apparent visual deficits   Perception     Praxis     Communication Communication Communication: Impaired Factors Affecting Communication: Reduced clarity of speech   Cognition Arousal: Alert Behavior During Therapy: WFL for tasks assessed/performed Cognition: Cognition impaired     Awareness: Intellectual awareness intact (online awareness improving) Memory impairment (select all impairments): Declarative long-term memory Attention impairment (select first level of impairment): Selective attention Executive functioning impairment (select all impairments): Sequencing, Problem solving OT - Cognition Comments: pleasant, witty humor. follows commands consistently and shows insight into physical deficits.                 Following commands: Intact        Cueing   Cueing Techniques: Verbal cues, Tactile cues  Exercises      Shoulder Instructions       General Comments      Pertinent Vitals/ Pain       Pain Assessment Pain Assessment: Faces Faces Pain Scale: Hurts little more Pain Location: R shoulder with reaching Pain Descriptors / Indicators: Grimacing, Guarding, Sore Pain Intervention(s): Monitored during session, Limited activity within patient's tolerance, Heat applied  Home Living                                          Prior Functioning/Environment              Frequency  Min 1X/week        Progress Toward Goals  OT Goals(current goals can now be found in the care plan section)  Progress towards OT goals: Progressing toward goals  Acute Rehab OT Goals Patient Stated Goal: increase confidence with transfers OT Goal  Formulation: With patient Time For Goal Achievement: 02/23/24 Potential to Achieve Goals: Good ADL Goals Pt Will Perform Grooming: with contact guard assist;standing Pt Will Perform Lower Body Bathing: with mod assist;sitting/lateral leans;sit to/from stand Pt Will Transfer to Toilet: with min assist;ambulating Pt Will Perform Toileting - Clothing Manipulation and hygiene: with mod assist;sit to/from stand;sitting/lateral leans  Plan      Co-evaluation                 AM-PAC OT "6 Clicks" Daily Activity     Outcome Measure   Help from another person eating meals?: A Little Help from another person taking care of personal grooming?: A Little Help from another person toileting, which includes using toliet, bedpan, or urinal?: A Lot Help from another person bathing (including washing, rinsing, drying)?: A Lot Help from another person to put on and taking off regular upper body clothing?: A Little Help from another person to put on and taking off regular lower body clothing?: A Lot 6 Click Score: 15    End of Session Equipment Utilized During Treatment: Gait belt;Rolling  walker (2 wheels)  OT Visit Diagnosis: Other abnormalities of gait and mobility (R26.89);Unsteadiness on feet (R26.81);Muscle weakness (generalized) (M62.81)   Activity Tolerance Patient tolerated treatment well   Patient Left in chair;with call bell/phone within reach;with chair alarm set   Nurse Communication          Time: [phone] OT Time Calculation (min): 33 min  Charges: OT General Charges $OT Visit: 1 Visit OT Treatments $Self Care/Home Management : 8-22 mins $Therapeutic Activity: 8-22 mins  Bradd Canary, OTR/L Acute Rehab Services Office: [phone]   Lorre Munroe 02/09/2024, 8:11 AM

## 2024-02-10 ENCOUNTER — Inpatient Hospital Stay (HOSPITAL_COMMUNITY)

## 2024-02-10 DIAGNOSIS — E1151 Type 2 diabetes mellitus with diabetic peripheral angiopathy without gangrene: Secondary | ICD-10-CM | POA: Diagnosis not present

## 2024-02-10 DIAGNOSIS — J9601 Acute respiratory failure with hypoxia: Secondary | ICD-10-CM | POA: Diagnosis not present

## 2024-02-10 DIAGNOSIS — J42 Unspecified chronic bronchitis: Secondary | ICD-10-CM | POA: Diagnosis not present

## 2024-02-10 DIAGNOSIS — I4892 Unspecified atrial flutter: Secondary | ICD-10-CM | POA: Diagnosis not present

## 2024-02-10 DIAGNOSIS — M7501 Adhesive capsulitis of right shoulder: Secondary | ICD-10-CM | POA: Insufficient documentation

## 2024-02-10 LAB — GLUCOSE, CAPILLARY
Glucose-Capillary: 142 mg/dL — ABNORMAL HIGH (ref 70–99)
Glucose-Capillary: 210 mg/dL — ABNORMAL HIGH (ref 70–99)
Glucose-Capillary: 248 mg/dL — ABNORMAL HIGH (ref 70–99)
Glucose-Capillary: 137 mg/dL — ABNORMAL HIGH (ref 70–99)

## 2024-02-10 MED ORDER — INSULIN ASPART 100 UNIT/ML IJ SOLN
0.0000 [IU] | Freq: Three times a day (TID) | INTRAMUSCULAR | Status: DC
  Administered 2024-02-10 (×2): 5 [IU] via SUBCUTANEOUS
  Administered 2024-02-11: 2 [IU] via SUBCUTANEOUS

## 2024-02-10 MED ORDER — DAPAGLIFLOZIN PROPANEDIOL 10 MG PO TABS
10.0000 mg | ORAL_TABLET | Freq: Every day | ORAL | Status: DC
  Administered 2024-02-10 – 2024-02-11 (×2): 10 mg via ORAL
  Filled 2024-02-10 (×2): qty 1

## 2024-02-10 MED ORDER — GLYCOPYRROLATE 1 MG PO TABS: 1.0000 mg | ORAL_TABLET | ORAL | Status: DC | PRN

## 2024-02-10 MED ORDER — RIVAROXABAN 10 MG PO TABS
20.0000 mg | ORAL_TABLET | Freq: Every day | ORAL | Status: DC
  Administered 2024-02-10 – 2024-02-11 (×2): 20 mg via ORAL
  Filled 2024-02-10 (×2): qty 2

## 2024-02-10 MED ORDER — MORPHINE SULFATE (PF) 2 MG/ML IV SOLN: 1.0000 mg | INTRAVENOUS | Status: DC | PRN

## 2024-02-10 MED ORDER — LORAZEPAM 1 MG PO TABS: 1.0000 mg | ORAL_TABLET | ORAL | Status: DC | PRN

## 2024-02-10 MED ORDER — ONDANSETRON HCL 4 MG/2ML IJ SOLN: 4.0000 mg | Freq: Four times a day (QID) | INTRAMUSCULAR | Status: DC | PRN

## 2024-02-10 MED ORDER — BIOTENE DRY MOUTH MT LIQD: 15.0000 mL | OROMUCOSAL | Status: DC | PRN

## 2024-02-10 MED ORDER — ACETAMINOPHEN 325 MG PO TABS: 650.0000 mg | ORAL_TABLET | Freq: Four times a day (QID) | ORAL | Status: DC | PRN

## 2024-02-10 MED ORDER — INSULIN GLARGINE-YFGN 100 UNIT/ML ~~LOC~~ SOLN
15.0000 [IU] | Freq: Every day | SUBCUTANEOUS | Status: DC
Start: 2024-02-10 — End: 2024-02-11
  Administered 2024-02-10: 15 [IU] via SUBCUTANEOUS
  Filled 2024-02-10 (×2): qty 0.15

## 2024-02-10 MED ORDER — LORAZEPAM 2 MG/ML PO CONC: 1.0000 mg | ORAL | Status: DC | PRN

## 2024-02-10 MED ORDER — METFORMIN HCL 500 MG PO TABS: 500.0000 mg | ORAL_TABLET | Freq: Two times a day (BID) | ORAL | Status: DC

## 2024-02-10 MED ORDER — INSULIN GLARGINE-YFGN 100 UNIT/ML ~~LOC~~ SOLN
15.0000 [IU] | Freq: Every day | SUBCUTANEOUS | Status: DC
  Filled 2024-02-10: qty 0.15

## 2024-02-10 MED ORDER — INSULIN ASPART 100 UNIT/ML IJ SOLN
3.0000 [IU] | Freq: Three times a day (TID) | INTRAMUSCULAR | Status: DC
  Administered 2024-02-10 – 2024-02-11 (×3): 3 [IU] via SUBCUTANEOUS

## 2024-02-10 MED ORDER — LORAZEPAM 2 MG/ML IJ SOLN: 1.0000 mg | INTRAMUSCULAR | Status: DC | PRN

## 2024-02-10 MED ORDER — GLUCERNA SHAKE PO LIQD
237.0000 mL | Freq: Every day | ORAL | Status: DC
  Administered 2024-02-10 – 2024-02-11 (×2): 237 mL via ORAL

## 2024-02-10 MED ORDER — GLYCOPYRROLATE 0.2 MG/ML IJ SOLN: 0.2000 mg | INTRAMUSCULAR | Status: DC | PRN

## 2024-02-10 MED ORDER — RIVAROXABAN 10 MG PO TABS: 20.0000 mg | ORAL_TABLET | Freq: Every day | ORAL | Status: DC

## 2024-02-10 MED ORDER — INSULIN ASPART 100 UNIT/ML IJ SOLN: 4.0000 [IU] | Freq: Three times a day (TID) | INTRAMUSCULAR | Status: DC

## 2024-02-10 MED ORDER — ONDANSETRON HCL 4 MG PO TABS: 4.0000 mg | ORAL_TABLET | Freq: Four times a day (QID) | ORAL | Status: DC | PRN

## 2024-02-10 MED ORDER — POLYVINYL ALCOHOL 1.4 % OP SOLN: 1.0000 [drp] | Freq: Four times a day (QID) | OPHTHALMIC | Status: DC | PRN

## 2024-02-10 MED ORDER — ONDANSETRON 4 MG PO TBDP: 4.0000 mg | ORAL_TABLET | Freq: Four times a day (QID) | ORAL | Status: DC | PRN

## 2024-02-10 MED ORDER — ATORVASTATIN CALCIUM 40 MG PO TABS
40.0000 mg | ORAL_TABLET | Freq: Every day | ORAL | Status: DC
  Administered 2024-02-10 – 2024-02-11 (×2): 40 mg via ORAL
  Filled 2024-02-10 (×2): qty 1

## 2024-02-10 MED ORDER — INSULIN ASPART 100 UNIT/ML IJ SOLN: 0.0000 [IU] | Freq: Every day | INTRAMUSCULAR | Status: DC

## 2024-02-10 MED ORDER — HALOPERIDOL 1 MG PO TABS: 0.5000 mg | ORAL_TABLET | ORAL | Status: DC | PRN

## 2024-02-10 MED ORDER — ACETAMINOPHEN 325 MG PO TABS
650.0000 mg | ORAL_TABLET | Freq: Four times a day (QID) | ORAL | Status: DC | PRN
  Administered 2024-02-10: 650 mg via ORAL
  Filled 2024-02-10: qty 2

## 2024-02-10 MED ORDER — METFORMIN HCL 500 MG PO TABS
500.0000 mg | ORAL_TABLET | Freq: Two times a day (BID) | ORAL | Status: DC
  Administered 2024-02-10 – 2024-02-11 (×2): 500 mg via ORAL
  Filled 2024-02-10 (×2): qty 1

## 2024-02-10 MED ORDER — HALOPERIDOL LACTATE 5 MG/ML IJ SOLN: 0.5000 mg | INTRAMUSCULAR | Status: DC | PRN

## 2024-02-10 MED ORDER — FUROSEMIDE 40 MG PO TABS
40.0000 mg | ORAL_TABLET | Freq: Every day | ORAL | Status: DC
  Administered 2024-02-10 – 2024-02-11 (×2): 40 mg via ORAL
  Filled 2024-02-10 (×3): qty 1

## 2024-02-10 MED ORDER — HALOPERIDOL LACTATE 2 MG/ML PO CONC: 0.5000 mg | ORAL | Status: DC | PRN

## 2024-02-10 NOTE — TOC Progression Note (Signed)
 Transition of Care [facility]) - Progression Note    Patient Details  Name: Jason [name] MRN: [id number] Date of Birth: 1941/05/20  Transition of Care [facility]) CM/SW Contact  Estill Llerena A [place], LCSW Phone Number: 02/10/2024, 11:44 AM  Clinical Narrative:     CSW spoke with pt's daughter, she stated that she was agreeable for pt to go to [place] for rehab.   CSW reached out to [facility], stated bed was available as early as today. Pt can DC over weekend if auth is approved by then.   CSW spoke with [facility] rehab, informed them that daughter now wants a different facility here in [place]. They stated that was fine. Informed CSW status of [facility] was still pending. Provided contact for [facility], site that is managing pt's authorization, [phone]. Auth facility will need to be changed before pt can DC. Provider notified.    TOC will continue to follow.   Expected Discharge Plan: Skilled Nursing Facility Barriers to Discharge: Continued Medical Work up, SNF Pending bed offer  Expected Discharge Plan and Services In-house Referral: Clinical Social Work   Post Acute Care Choice: Skilled Nursing Facility Living arrangements for the past 2 months: Single Family Home                                       Social Determinants of Health (SDOH) Interventions SDOH Screenings   Food Insecurity: No Food Insecurity (04/26/2023)  Housing: Low Risk  (02/01/2024)  Transportation Needs: Unmet Transportation Needs (02/01/2024)  Utilities: Not At Risk (02/01/2024)  Alcohol Screen: Low Risk  (06/01/2022)  Depression (PHQ2-9): Low Risk  (06/01/2022)  Financial Resource Strain: Low Risk  (06/01/2022)  Physical Activity: Sufficiently Active (06/01/2022)  Social Connections: Socially Isolated (02/01/2024)  Stress: Stress Concern Present (06/01/2022)  Tobacco Use: Medium Risk (01/29/2024)    Readmission Risk Interventions    04/30/2023   12:25 PM  Readmission Risk Prevention Plan   Transportation Screening Complete  PCP or Specialist Appt within 3-5 Days Complete  HRI or Home Care Consult Complete  Social Work Consult for Recovery Care Planning/Counseling Complete  Palliative Care Screening Complete  Medication Review Oceanographer) Complete

## 2024-02-10 NOTE — Progress Notes (Signed)
 Physical Therapy Treatment [name] MRN: [id number] DOB: [birth year] Today's Date: 02/10/2024   History of Present Illness 83 yo male admitted 3/16 with acute respiratory distress, AMS, hypoxic, Acutely decompensated HFpEF. Intubated 3/16-3/19. PMhx: COPD, HTN, HLD, HFpEF, sleep apnea, T2DM, AFib, PAD, Rt transmet amputation    PT Comments  Pt pleasant and initially stating willing to "dance" but after sitting up reports increased Rt shoulder pain, unable to tolerate pressure on arm in standing and denied OOB or gait trials. Pt able to rise from elevated bed with min assist but requires significant elevation due to height and weakness with education on need for standing and HEP to return to PLOF but pt denied attempting at this time. Will continue to follow.      If plan is discharge home, recommend the following: A lot of help with walking and/or transfers;A lot of help with bathing/dressing/bathroom;Help with stairs or ramp for entrance;Assist for transportation   Can travel by private vehicle     No  Equipment Recommendations  BSC/3in1    Recommendations for Other Services       Precautions / Restrictions Precautions Precautions: Fall Recall of Precautions/Restrictions: Impaired     Mobility  Bed Mobility Overal bed mobility: Needs Assistance Bed Mobility: Supine to Sit, Sit to Supine Rolling: Min assist Sidelying to sit: Min assist Supine to sit: Mod assist     General bed mobility comments: HOB 25 degrees with physical assist to roll to left, would not reach with RUE due to pain needing assist to advance and elevate trunk. return to supine with assist to lift legs to surface    Transfers Overall transfer level: Needs assistance   Transfers: Sit to/from Stand Sit to Stand: Min assist, From elevated surface           General transfer comment: min assist to rise from elevated bed roughly 26" x 2 trials with cues for hand placement,  increased time to extend trunk and min assist for balance. Pt stood grossly 10 and 30 sec with side stepping toward HOB with RW but denied pivot OOB or gait trials    Ambulation/Gait               General Gait Details: pt refused   Stairs             Wheelchair Mobility     Tilt Bed    Modified Rankin (Stroke Patients Only)       Balance Overall balance assessment: Needs assistance Sitting-balance support: No upper extremity supported, Feet supported Sitting balance-Leahy Scale: Fair Sitting balance - Comments: EOB without support   Standing balance support: Bilateral upper extremity supported, During functional activity, Reliant on assistive device for balance Standing balance-Leahy Scale: Poor Standing balance comment: reliant on RW                            Communication Communication Communication: No apparent difficulties  Cognition Arousal: Alert Behavior During Therapy: WFL for tasks assessed/performed   PT - Cognitive impairments: Orientation, Awareness, Problem solving, Memory                       PT - Cognition Comments: pt not oriented to time, less responsive to cues and refused OOB mobility which is a change from prior sessions Following commands: Impaired Following commands impaired: Follows one step commands with increased time    Cueing Cueing Techniques: Verbal  cues, Tactile cues, Gestural cues  Exercises      General Comments        Pertinent Vitals/Pain Pain Assessment Pain Assessment: 0-10 Faces Pain Scale: Hurts even more Pain Location: Rt shoulder, would not move or allow AROM for flexion, limited elbow movement Pain Descriptors / Indicators: Grimacing, Guarding, Sore Pain Intervention(s): Limited activity within patient's tolerance, Monitored during session, Repositioned, Patient requesting pain meds-RN notified    Home Living                          Prior Function            PT  Goals (current goals can now be found in the care plan section) Progress towards PT goals: Not progressing toward goals - comment    Frequency    Min 2X/week      PT Plan      Co-evaluation              AM-PAC PT "6 Clicks" Mobility   Outcome Measure  Help needed turning from your back to your side while in a flat bed without using bedrails?: A Little Help needed moving from lying on your back to sitting on the side of a flat bed without using bedrails?: A Little Help needed moving to and from a bed to a chair (including a wheelchair)?: Total Help needed standing up from a chair using your arms (e.g., wheelchair or bedside chair)?: A Lot Help needed to walk in hospital room?: Total Help needed climbing 3-5 steps with a railing? : Total 6 Click Score: 11    End of Session Equipment Utilized During Treatment: Gait belt Activity Tolerance: Patient limited by fatigue;Patient limited by pain Patient left: in bed;with call bell/phone within reach;with family/visitor present;with bed alarm set Nurse Communication: Mobility status PT Visit Diagnosis: Other abnormalities of gait and mobility (R26.89);Difficulty in walking, not elsewhere classified (R26.2);Muscle weakness (generalized) (M62.81)     Time: 5643-3295 PT Time Calculation (min) (ACUTE ONLY): 24 min  Charges:    $Therapeutic Activity: 23-37 mins PT General Charges $$ ACUTE PT VISIT: 1 Visit                     Merryl Hacker, PT Acute Rehabilitation Services Office: [phone]    Enedina Finner Jonnie [place] 02/10/2024, 10:25 AM

## 2024-02-10 NOTE — Plan of Care (Signed)
  Problem: Clinical Measurements: Goal: Ability to maintain clinical measurements within normal limits will improve Outcome: Progressing Goal: Will remain free from infection Outcome: Progressing Goal: Diagnostic test results will improve Outcome: Progressing Goal: Respiratory complications will improve Outcome: Progressing Goal: Cardiovascular complication will be avoided Outcome: Progressing   Problem: Health Behavior/Discharge Planning: Goal: Ability to manage health-related needs will improve Outcome: Progressing   Problem: Education: Goal: Knowledge of General Education information will improve Description: Including pain rating scale, medication(s)/side effects and non-pharmacologic comfort measures Outcome: Progressing   Problem: Coping: Goal: Level of anxiety will decrease Outcome: Progressing   Problem: Elimination: Goal: Will not experience complications related to bowel motility Outcome: Progressing Goal: Will not experience complications related to urinary retention Outcome: Progressing   Problem: Pain Managment: Goal: General experience of comfort will improve and/or be controlled Outcome: Progressing   Problem: Safety: Goal: Ability to remain free from injury will improve Outcome: Progressing   Problem: Skin Integrity: Goal: Risk for impaired skin integrity will decrease Outcome: Progressing   Problem: Health Behavior/Discharge Planning: Goal: Ability to identify and utilize available resources and services will improve Outcome: Progressing Goal: Ability to manage health-related needs will improve Outcome: Progressing

## 2024-02-10 NOTE — Progress Notes (Signed)
 PROGRESS NOTE  Jason [name] QIO:[id number] DOB: 26-Apr-1941   PCP: [name], MD  Patient is from: Home.  Lives with wife who has dementia.  Has caregiver.   DOA: 01/29/2024 LOS: 12  Chief complaints Chief Complaint  Patient presents with   Respiratory Distress   Altered Mental Status     Brief Narrative / Interim history: 83 year old M with PMH of diastolic CHF, sev mitral valve stenosis, OSA, atrial flutter on Xarelto, DM-2, HTN, HLD, PAD and chronic pain brought to ED due to altered mental status and respiratory distress, and admitted to ICU with acute on chronic respiratory failure with hypoxia and hypercapnia, CHF exacerbation and pleural effusion.  Reportedly, patient was hypoxic to 60s with witnessed LOC when first responders arrived and had brief CPR.  However, found to have strong pulses when EMS arrived.  In ED, hypothermic, tachypneic with mild hypertension. VBG 7.1/95/75/27 suggesting acute respiratory acidosis.  K3.4. Na 133.  Cr 1.4.  BUN 24.  Hgb 11.  BNP 1360.  Troponin 40.  Lactic acid 3.8.  CXR with small to moderate layering right pleural effusion.  Started on broad-spectrum antibiotics.  Intubated and admitted to ICU.    While in ICU, he had chest tube placement for pleural effusion.  Diuresed with IV Lasix.  TTE with LVEF of 55 to 60%, moderate concentric LVH, D-shaped septum with moderately reduced RVSF, moderate LAE and RAE and severe mitral valve stenosis (not new).   Patient was extubated on 3/19 and transferred to [facility] hospitalist service on 3/21 on room air.  Therapy recommending SNF.  Medically stable for discharge.  Subjective: Seen and examined earlier this morning.  Reports right shoulder pain.  He reports he had a fall about a month ago.  He did not seek care at that time.  Very limited active and passive range of motion at right shoulder.  No significant focal tenderness, deformity, erythema or swelling.  Neurovascular intact.  Objective: Vitals:    02/09/24 2034 02/09/24 2333 02/10/24 0432 02/10/24 0815  BP: 134/65 126/66 128/63 (!) 115/57  Pulse: 61 (!) 56 63 (!) 57  Resp: 20 20 20    Temp: 98.3 F (36.8 C) 98.3 F (36.8 C) 97.9 F (36.6 C) 98.1 F (36.7 C)  TempSrc: Oral Oral Oral Oral  SpO2: 99% 96% 93% 93%  Weight:   126.6 kg   Height:        Examination:  GENERAL: No apparent distress.  Nontoxic. HEENT: MMM.  Vision and hearing grossly intact.  NECK: Supple.  No apparent JVD.  RESP:  No IWOB.  Fair aeration bilaterally but limited exam CVS:  RRR. Heart sounds normal.  ABD/GI/GU: BS+. Abd soft, NTND.  MSK/EXT:  Moves extremities.  Right TMA.  BLE edema/venous insufficiency.  Faint DP pulses.  Very limited active and passive ROM at right shoulder.  Neurovascular intact. SKIN: Lower extremity skin changes in keeping with venous sufficiency/chronic PAD. NEURO: Awake, alert and oriented appropriately.  No apparent focal neurodeficit. PSYCH: Calm. Normal affect.   Consultants:  Critical care admitted patient  Procedures: Intubation and mechanical ventilation Right chest tube   Microbiology summarized: COVID-19, influenza and RSV PCR nonreactive MRSA PCR screen nonreactive Blood cultures NGTD Respiratory culture normal respiratory flora Right pleural fluid culture NGTD  Assessment and plan: Acute hypoxic and hypercapnic respiratory failure: Present on arrival.  Multifactorial including CHF, underlying COPD, OSA, polypharmacy, encephalopathy and pleural effusion.  Resolved. -Treat treatable causes -Minimize sedating medications. -Continue Dulera twice daily, DuoNebs and mucolytic's  as needed.  Acute on chronic biventricular heart failure: TTE with LVEF of 55 to 60%, moderate concentric LVH, D-shaped septum with moderately reduced RVSF, moderate LAE and RAE and severe mitral valve stenosis (not new).  BNP elevated to 1360.  CXR with mild to moderate right pleural effusion.  Diuresed with IV Lasix.  Restarted on p.o.  Lasix.  -Continue p.o. Lasix 40 mg daily -Started Farxiga 10 g daily on 3/22. -Strict intake and output, daily weight, renal functions and electrolytes  Right pleural effusion: Chest x-ray showed mild to moderate pleural effusion.  Likely due to CHF. -S/p chest tube and diuretics.  Fluid transudative and culture negative. -Manage CHF as above  Untreated OSA: Patient and family does not recall diagnosis of OSA but his sleep study in 2017 was positive.  Not able to access his spirometry result at the same time.  Not on CPAP or inhalers at home. -Refused trial of CPAP here.  IDDM-2 with hyperglycemia: A1c 7.2%. Recent Labs  Lab 02/09/24 1129 02/09/24 1558 02/09/24 2035 02/10/24 0807 02/10/24 1140  GLUCAP 264* 180* 179* 142* 210*  -Continue SSI-moderate -Continue NovoLog 3 units 3 times daily with meals -Increase Semglee from 10 to 15 units daily -Start low-dose metformin 500 mg twice daily -Continue Farxiga.  AKI: Baseline Cr ~0.9-1.0.  AKI resolved. Recent Labs    01/29/24 1515 01/30/24 0350 01/31/24 0408 02/01/24 0315 02/02/24 0359 02/03/24 0602 02/04/24 0656 02/05/24 0737 02/06/24 1127 02/08/24 1808  BUN 24* 20 21 18 17 22 20 17 15 15   CREATININE 1.40* 1.22 1.23 1.15 1.11 1.35* 1.12 1.13 1.18 1.21  -Monitor intermittently  Normocytic anemia: Likely due to chronic disease and acute illness.  No overt bleeding.  He is on Xarelto.  H&H stable now.  Anemia panel with iron deficiency.  Received IV Venofer 300 mg on 3/24 Recent Labs    01/29/24 2117 01/30/24 0350 01/31/24 0000 01/31/24 0408 02/01/24 0315 02/02/24 0359 02/03/24 0602 02/04/24 0656 02/06/24 1127 02/08/24 1808  HGB 9.6* 9.9* 9.5* 9.3* 9.0* 8.4* 8.2* 8.8* 10.1* 9.4*  -Monitor intermittently  Atrial flutter anticoagulated with Xarelto at baseline -Continue home Xarelto. -Toprol-XL on hold due to borderline bradycardia. -Optimize electrolytes  Bilateral PAD/possible cellulitis: ABI in 03/2023 with  noncompressible lower extremity arthritis bilaterally.  He seems to have chronic venous insufficiency as well.  He is on Xarelto.  He is LDL is 114 in 03/2023.  LDL 102.  He is not on a statin. -Completed antibiotic course with doxycycline CTX on 3/24 -Continue Xarelto and Lipitor.  Hesitant to add aspirin with his anemia  Chronic pain on opiate-Per narcotic database, he had prescription for oxycodone in February and tramadol and December.  Denies current use.   Currently not in significant pain or discomfort. -Tylenol as needed for pain -Avoid opiates.  Right shoulder pain/adhesive capsulitis: Reports fall about a month ago.  Did not seek care or evaluation at that time.  He has very limited passive and active ROM in right shoulder suggesting frozen shoulder.  He has no focal tenderness, deformity, erythema.  Neurovascular intact. -Will get right shoulder x-ray -Tylenol as needed -Continue therapy  Physical deconditioning: Daughter reports significant physical deconditioning lately.  -PT/OT recommended SNF.  Hyponatremia: Resolved  Class I obesity Body mass index is 33.1 kg/m. Nutrition Problem: Inadequate oral intake Etiology: inability to eat Signs/Symptoms: NPO status Interventions: Refer to RD note for recommendations   DVT prophylaxis:  rivaroxaban (XARELTO) tablet 20 mg Start: 02/10/24 1800 rivaroxaban (XARELTO) tablet 20  mg  Code Status: Full code Family Communication: None at bedside. Level of care: Telemetry Medical Status is: Inpatient Remains inpatient appropriate because: Safe disposition/SNF.   Final disposition: SNF.  Medically stable for discharge.   35 minutes with more than 50% spent in reviewing records, counseling patient/family and coordinating care.   Sch Meds:  Scheduled Meds:  atorvastatin  40 mg Oral Daily   dapagliflozin propanediol  10 mg Oral Daily   feeding supplement (GLUCERNA SHAKE)  237 mL Oral Daily   furosemide  40 mg Oral Daily    insulin aspart  0-15 Units Subcutaneous TID WC   insulin aspart  0-5 Units Subcutaneous QHS   insulin aspart  3 Units Subcutaneous TID WC   insulin glargine-yfgn  15 Units Subcutaneous QHS   metFORMIN  500 mg Oral BID WC   rivaroxaban  20 mg Oral Daily   Continuous Infusions:   PRN Meds:.acetaminophen, ondansetron **OR** ondansetron (ZOFRAN) IV  Antimicrobials: Anti-infectives (From admission, onward)    Start     Dose/Rate Route Frequency Ordered Stop   02/02/24 1000  doxycycline (VIBRA-TABS) tablet 100 mg        100 mg Oral Every 12 hours 02/01/24 2114 02/05/24 2230   01/31/24 2200  doxycycline (VIBRA-TABS) tablet 100 mg  Status:  Discontinued        100 mg Per Tube Every 12 hours 01/31/24 1452 02/01/24 2114   01/30/24 1000  cefTRIAXone (ROCEPHIN) 2 g in sodium chloride 0.9 % 100 mL IVPB        2 g 200 mL/hr over 30 Minutes Intravenous Every 24 hours 01/29/24 1659 02/05/24 1010   01/30/24 1000  doxycycline (VIBRAMYCIN) 100 mg in sodium chloride 0.9 % 250 mL IVPB  Status:  Discontinued        100 mg 125 mL/hr over 120 Minutes Intravenous Every 12 hours 01/29/24 1659 01/31/24 1452   01/29/24 1715  linezolid (ZYVOX) IVPB 600 mg  Status:  Discontinued        600 mg 300 mL/hr over 60 Minutes Intravenous Every 12 hours 01/29/24 1704 01/30/24 1106   01/29/24 1530  cefTRIAXone (ROCEPHIN) 2 g in sodium chloride 0.9 % 100 mL IVPB        2 g 200 mL/hr over 30 Minutes Intravenous Once 01/29/24 1529 01/29/24 1728   01/29/24 1530  azithromycin (ZITHROMAX) 500 mg in sodium chloride 0.9 % 250 mL IVPB        500 mg 250 mL/hr over 60 Minutes Intravenous  Once 01/29/24 1529 01/29/24 1738        I have personally reviewed the following labs and images: CBC: Recent Labs  Lab 02/04/24 0656 02/06/24 1127 02/08/24 1808  WBC 4.6 5.3 5.2  HGB 8.8* 10.1* 9.4*  HCT 28.8* 33.6* 31.7*  MCV 87.0 89.6 89.3  PLT 228 327 277   BMP &GFR Recent Labs  Lab 02/04/24 0656 02/05/24 0737  02/06/24 1127 02/08/24 1808  NA 140 140 139 138  K 3.6 3.7 3.6 4.4  CL 100 103 102 103  CO2 29 27 27 23   GLUCOSE 169* 139* 193* 236*  BUN 20 17 15 15   CREATININE 1.12 1.13 1.18 1.21  CALCIUM 8.7* 9.0 9.3 9.0  MG 2.1 2.0 2.2 2.1  PHOS 3.2 3.5 4.0 3.3   Estimated Creatinine Clearance: 70.5 mL/min (by C-G formula based on SCr of 1.21 mg/dL). Liver & Pancreas: Recent Labs  Lab 02/04/24 0656 02/05/24 0737 02/06/24 1127 02/08/24 1808  ALBUMIN 2.9* 2.8* 3.4* 3.1*  No results for input(s): "LIPASE", "AMYLASE" in the last 168 hours. No results for input(s): "AMMONIA" in the last 168 hours. Diabetic: No results for input(s): "HGBA1C" in the last 72 hours. Recent Labs  Lab 02/09/24 1129 02/09/24 1558 02/09/24 2035 02/10/24 0807 02/10/24 1140  GLUCAP 264* 180* 179* 142* 210*   Cardiac Enzymes: No results for input(s): "CKTOTAL", "CKMB", "CKMBINDEX", "TROPONINI" in the last 168 hours. No results for input(s): "PROBNP" in the last 8760 hours. Coagulation Profile: No results for input(s): "INR", "PROTIME" in the last 168 hours.  Thyroid Function Tests: No results for input(s): "TSH", "T4TOTAL", "FREET4", "T3FREE", "THYROIDAB" in the last 72 hours. Lipid Profile: No results for input(s): "CHOL", "HDL", "LDLCALC", "TRIG", "CHOLHDL", "LDLDIRECT" in the last 72 hours.  Anemia Panel: No results for input(s): "VITAMINB12", "FOLATE", "FERRITIN", "TIBC", "IRON", "RETICCTPCT" in the last 72 hours.  Urine analysis:    Component Value Date/Time   COLORURINE AMBER (A) 01/29/2024 1529   APPEARANCEUR HAZY (A) 01/29/2024 1529   LABSPEC 1.019 01/29/2024 1529   PHURINE 5.0 01/29/2024 1529   GLUCOSEU NEGATIVE 01/29/2024 1529   GLUCOSEU >=1000 (A) 03/29/2023 1412   HGBUR NEGATIVE 01/29/2024 1529   BILIRUBINUR NEGATIVE 01/29/2024 1529   BILIRUBINUR neg 03/21/2018 0940   KETONESUR NEGATIVE 01/29/2024 1529   PROTEINUR 100 (A) 01/29/2024 1529   UROBILINOGEN 0.2 03/29/2023 1412   NITRITE  NEGATIVE 01/29/2024 1529   LEUKOCYTESUR NEGATIVE 01/29/2024 1529   Sepsis Labs: Invalid input(s): "PROCALCITONIN", "LACTICIDVEN"  Microbiology: No results found for this or any previous visit (from the past 240 hours).   Radiology Studies: No results found.    Ceasia Elwell T. Jazline Cumbee [facility]  If 7PM-7AM, please contact night-coverage [email or web address] 02/10/2024, 2:32 PM

## 2024-02-10 NOTE — Progress Notes (Signed)
 Initial Nutrition Assessment  DOCUMENTATION CODES:   Obesity unspecified  INTERVENTION:  Continue  DYS 3 diet for easier meal consumption Ordering assistance RD removed sodas from trays  Encourage PO intake Glucerna Shake po daily, each supplement provides 220 kcal and 10 grams of protein  NUTRITION DIAGNOSIS:   Inadequate oral intake related to inability to eat as evidenced by NPO status.  - Progressing, diet advanced   GOAL:   Patient will meet greater than or equal to 90% of their needs  - Ongoing   MONITOR:   TF tolerance, I & O's, Vent status, Labs  REASON FOR ASSESSMENT:   Ventilator, Consult Enteral/tube feeding initiation and management  ASSESSMENT:   Pt with hx of COPD, HTN, HLD, CHF, GERD, IBS, hx bladder cancer, and DM type 2 presented to ED with AMS and respiratory failure. Intubated upon arrival to ED  3/16 - presented to ED, intubated, chest tube placed 3/19 - extubated, chest tube removed  Patient reports having a good appetite however PO intake varies, In the last couple of days he averaged 42% of his meals. Is drinking 1-2 Ensures per day. Ensures were discontinued today, patient still with varying PO intake, will add Glucerna. Patient changed to heart healthy/carb modified diet today. Patient prefers soft items and last RD changed diet to dysphagia 3 to help with intake. Will change back to dysphagia 3 and remove sodas from tray.  Medically ready for discharge, plan for SNF.   Admit weight: 120.8 kg Current weight: 126.6 kg    Average Meal Intake: 3/25-3/27: 42% intake x 8 recorded meals  Nutritionally Relevant Medications: Scheduled Meds:  atorvastatin  40 mg Oral Daily   dapagliflozin propanediol  10 mg Oral Daily   feeding supplement (GLUCERNA SHAKE)  237 mL Oral Daily   furosemide  40 mg Oral Daily   insulin aspart  0-15 Units Subcutaneous TID WC   insulin aspart  0-5 Units Subcutaneous QHS   insulin aspart  3 Units Subcutaneous TID WC    insulin glargine-yfgn  15 Units Subcutaneous QHS   metFORMIN  500 mg Oral BID WC   rivaroxaban  20 mg Oral Daily   Labs Reviewed: No recent labs  CBG ranges from 142-264 mg/dL over the last 24 hours HgbA1c 7.2  Diet Order:   Diet Order             DIET DYS 3 Room service appropriate? Yes with Assist; Fluid consistency: Thin  Diet effective now                   EDUCATION NEEDS:   Not appropriate for education at this time  Skin:  Skin Assessment: Skin Integrity Issues: Skin Integrity Issues:: Other (Comment) Other: Skin tear L second toe, skin toe, right foot  Last BM:  02/09/24 type 6  Height:   Ht Readings from Last 1 Encounters:  01/29/24 6\' 5"  (1.956 m)    Weight:   Wt Readings from Last 1 Encounters:  02/10/24 126.6 kg    Ideal Body Weight:  94.5 kg  BMI:  Body mass index is 33.1 kg/m.  Estimated Nutritional Needs:   Kcal:  2200-2400 kcal/d  Protein:  110-125g/d  Fluid:  2.4L/d  Elliot Dally, RD Registered Dietitian  See Amion for more information

## 2024-02-10 NOTE — TOC Progression Note (Signed)
 Transition of Care [facility]) - Progression Note    Patient Details  Name: Jason [name] MRN: [id number] Date of Birth: 1941/04/04  [facility] CM/SW Contact  Karlyn Glasco A [place], LCSW Phone Number: 02/10/2024, 3:22 PM  Clinical Narrative:     CSW received confirmation from [facility] that pt's authorization was approved. CSW contacted [employer] and changed pt's authorization facility to [facility]. Auth ID: [id number]. Approval Dates: 02/09/24-4/2-25.   CSW waiting to hear back from facility regarding confirmation of discharge possible for today versus tomorrow. Provider notified.    TOC will continue to follow.   Expected Discharge Plan: Skilled Nursing Facility Barriers to Discharge: Continued Medical Work up, SNF Pending bed offer  Expected Discharge Plan and Services In-house Referral: Clinical Social Work   Post Acute Care Choice: Skilled Nursing Facility Living arrangements for the past 2 months: Single Family Home                                       Social Determinants of Health (SDOH) Interventions SDOH Screenings   Food Insecurity: No Food Insecurity (04/26/2023)  Housing: Low Risk  (02/01/2024)  Transportation Needs: Unmet Transportation Needs (02/01/2024)  Utilities: Not At Risk (02/01/2024)  Alcohol Screen: Low Risk  (06/01/2022)  Depression (PHQ2-9): Low Risk  (06/01/2022)  Financial Resource Strain: Low Risk  (06/01/2022)  Physical Activity: Sufficiently Active (06/01/2022)  Social Connections: Socially Isolated (02/01/2024)  Stress: Stress Concern Present (06/01/2022)  Tobacco Use: Medium Risk (01/29/2024)    Readmission Risk Interventions    04/30/2023   12:25 PM  Readmission Risk Prevention Plan  Transportation Screening Complete  PCP or Specialist Appt within 3-5 Days Complete  HRI or Home Care Consult Complete  Social Work Consult for Recovery Care Planning/Counseling Complete  Palliative Care Screening Complete  Medication Review Furniture conservator/restorer) Complete

## 2024-02-11 DIAGNOSIS — M7501 Adhesive capsulitis of right shoulder: Secondary | ICD-10-CM

## 2024-02-11 DIAGNOSIS — E1151 Type 2 diabetes mellitus with diabetic peripheral angiopathy without gangrene: Secondary | ICD-10-CM | POA: Diagnosis not present

## 2024-02-11 DIAGNOSIS — A419 Sepsis, unspecified organism: Secondary | ICD-10-CM

## 2024-02-11 DIAGNOSIS — I4892 Unspecified atrial flutter: Secondary | ICD-10-CM | POA: Diagnosis not present

## 2024-02-11 LAB — GLUCOSE, CAPILLARY
Glucose-Capillary: 130 mg/dL — ABNORMAL HIGH (ref 70–99)
Glucose-Capillary: 129 mg/dL — ABNORMAL HIGH (ref 70–99)

## 2024-02-11 MED ORDER — INSULIN ASPART 100 UNIT/ML IJ SOLN
0.0000 [IU] | Freq: Three times a day (TID) | INTRAMUSCULAR | 11 refills | Status: AC
Start: 2024-02-11 — End: ?

## 2024-02-11 MED ORDER — FERROUS SULFATE 325 (65 FE) MG PO TBEC
325.0000 mg | DELAYED_RELEASE_TABLET | Freq: Two times a day (BID) | ORAL | Status: AC
Start: 2024-02-11 — End: 2025-02-10

## 2024-02-11 MED ORDER — METFORMIN HCL 500 MG PO TABS: 500.0000 mg | ORAL_TABLET | Freq: Two times a day (BID) | ORAL | Status: AC

## 2024-02-11 MED ORDER — INSULIN GLARGINE-YFGN 100 UNIT/ML ~~LOC~~ SOLN: 20.0000 [IU] | Freq: Every day | SUBCUTANEOUS | Status: AC

## 2024-02-11 NOTE — Discharge Summary (Signed)
 Physician Discharge Summary  Jason [name]:[id number] DOB: June 02, 1941 DOA: 01/29/2024  PCP: [name], MD  Admit date: 01/29/2024 Discharge date: 02/11/24  Admitted From: Home Disposition: SNF Recommendations for Outpatient Follow-up:  Check CMP and CBC in 1 week Outpatient follow-up with cardiology as previously planned. Assess glycemic control and adjust meds as appropriate Please follow up on the following pending results: None  Discharge Condition: Stable CODE STATUS: Full code  Contact information for after-discharge care     Destination     [facility] .   Service: Skilled Nursing Contact information: [address] [place] [address] [phone]                     Hospital course 83 year old M with PMH of diastolic CHF, sev mitral valve stenosis, OSA, atrial flutter on Xarelto, DM-2, HTN, HLD, PAD, adhesive capsulitis of right shoulder and chronic pain brought to ED due to altered mental status and respiratory distress, and admitted to ICU with acute on chronic respiratory failure with hypoxia and hypercapnia, CHF exacerbation and pleural effusion.  Reportedly, patient was hypoxic to 60s with witnessed LOC when first responders arrived and had brief CPR.  However, found to have strong pulses when EMS arrived.   In ED, hypothermic, tachypneic with mild hypertension. VBG 7.1/95/75/27 suggesting acute respiratory acidosis.  K3.4. Na 133.  Cr 1.4.  BUN 24.  Hgb 11.  BNP 1360.  Troponin 40.  Lactic acid 3.8.  CXR with small to moderate layering right pleural effusion.  Started on broad-spectrum antibiotics.  Intubated and admitted to ICU.     While in ICU, he had chest tube placement for pleural effusion.  Diuresed with IV Lasix.  TTE with LVEF of 55 to 60%, moderate concentric LVH, D-shaped septum with moderately reduced RVSF, moderate LAE and RAE and severe mitral valve stenosis (not new).    Patient was extubated on 3/19 and  transferred to [facility] hospitalist service on 3/21 on room air.  Therapy recommending SNF.  See individual problem list below for more.   Problems addressed during this hospitalization Acute hypoxic and hypercapnic respiratory failure: Present on arrival.  Multifactorial including CHF, underlying COPD, OSA, polypharmacy, encephalopathy and pleural effusion.  Resolved. -Minimize sedating medications. -Continue Dulera twice daily and DuoNebs   Acute on chronic biventricular heart failure: TTE with LVEF of 55 to 60%, moderate concentric LVH, D-shaped septum with moderately reduced RVSF, moderate LAE and RAE and severe mitral valve stenosis (not new).  BNP elevated to 1360.  CXR with mild to moderate right pleural effusion.  Diuresed with IV Lasix.  Restarted on p.o. Lasix.  -Continue p.o. Lasix 40 mg daily -Continue Farxiga 10 mg daily. -Reassess fluid status, renal functions and electrolytes  Severe mitral valve stenosis: -Outpatient follow-up with cardiology   Right pleural effusion: Chest x-ray showed mild to moderate pleural effusion.  Likely due to CHF. -S/p chest tube and diuretics.  Fluid transudative and culture negative. -Manage CHF as above   Untreated OSA: Patient and family does not recall diagnosis of OSA but his sleep study in 2017 was positive.  Not able to access his spirometry result at the same time.  Not on CPAP or inhalers at home. -Refused trial of CPAP here.  No desaturation.   IDDM-2 with hyperglycemia: A1c 7.2%. -Continue SSI-moderate (1 to 15 units based on CBG) -Semglee 20 units daily -Metformin 500 mg twice daily -Continue Farxiga 10 mg daily. -Further adjustment as appropriate   AKI:  Baseline Cr ~0.9-1.0.  AKI resolved. -Recheck renal function in 1 to 2 weeks   Normocytic anemia: Likely due to chronic disease and acute illness.  No overt bleeding.  He is on Xarelto.  H&H stable now.  Anemia panel with iron deficiency.  Received IV Venofer 300 mg on  3/24 -Recheck CBC in 1 to 2 weeks -P.o. ferrous sulfate with bowel regimen   Atrial flutter anticoagulated with Xarelto at baseline -Continue home Xarelto. -Toprol-XL discontinued due to bradycardia/borderline heart rate   Bilateral PAD/possible cellulitis: ABI in 03/2023 with noncompressible lower extremity arthritis bilaterally.  He seems to have chronic venous insufficiency as well.  He is on Xarelto.  He is LDL is 114 in 03/2023.  LDL 102.  He is not on a statin. -Completed antibiotic course with doxycycline and CTX on 3/24 -Continue Xarelto and Lipitor.  Hesitant to add aspirin with his anemia   Chronic pain on opiate-Per narcotic database, he had prescription for oxycodone in February and tramadol and December.  Denies current use.   Currently not in significant pain or discomfort. -Tylenol as needed for pain -Avoid opiates.   Right shoulder pain/adhesive capsulitis: Reports fall about a month ago.  Did not seek care or evaluation at that time.  He has very limited passive and active ROM in right shoulder suggesting frozen shoulder.  He has no focal tenderness, deformity, erythema.  Neurovascular intact.  Shoulder x-ray showed arthritis. -Tylenol as needed -Continue therapy   Physical deconditioning: Daughter reports significant physical deconditioning lately.  -PT/OT recommended SNF.   Hyponatremia: Resolved   Class I obesity Nutrition Problem: Inadequate oral intake Etiology: inability to eat Signs/Symptoms: NPO status Interventions: Refer to RD note for recommendations     Time spent 35 minutes  Vital signs Vitals:   02/10/24 1951 02/11/24 0055 02/11/24 0451 02/11/24 0452  BP: (!) 116/54 111/63 (!) 124/56   Pulse: 67 (!) 59 60   Temp: 99.5 F (37.5 C) 99 F (37.2 C) 98.6 F (37 C)   Resp: 17 17 17    Height:      Weight:    127.2 kg  SpO2: 98% 93% 92%   TempSrc: Oral Oral    BMI (Calculated):    33.25     Discharge exam  GENERAL: No apparent distress.   Nontoxic. HEENT: MMM.  Vision and hearing grossly intact.  NECK: Supple.  No apparent JVD.  RESP:  No IWOB.  Fair aeration bilaterally but limited exam CVS:  RRR. Heart sounds normal.  ABD/GI/GU: BS+. Abd soft, NTND.  MSK/EXT:  Moves extremities.  Right TMA.  BLE edema/venous insufficiency.  Faint DP pulses.  Very limited active and passive ROM at right shoulder.  Neurovascular intact. SKIN: Lower extremity skin changes in keeping with venous sufficiency/chronic PAD. NEURO: Awake, alert and oriented appropriately.  No apparent focal neurodeficit. PSYCH: Calm. Normal affect.  Discharge Instructions Discharge Instructions     Diet - low sodium heart healthy   Complete by: As directed    DIET DYS 3 Room service appropriate? Yes with Assist; Fluid consistency: Thin  Diet effective now   Diet Carb Modified   Complete by: As directed    DIET DYS 3 Room service appropriate? Yes with Assist; Fluid consistency: Thin  Diet effective now   Increase activity slowly   Complete by: As directed    No wound care   Complete by: As directed       Allergies as of 02/11/2024  Reactions   Bactrim [sulfamethoxazole-trimethoprim] Rash        Medication List     STOP taking these medications    insulin aspart 100 UNIT/ML FlexPen Commonly known as: NOVOLOG Replaced by: insulin aspart 100 UNIT/ML injection   metoprolol succinate 25 MG 24 hr tablet Commonly known as: TOPROL-XL   morphine 30 MG tablet Commonly known as: MSIR   Toujeo Max SoloStar 300 UNIT/ML Solostar Pen Generic drug: insulin glargine (2 Unit Dial)   traMADol 50 MG tablet Commonly known as: ULTRAM   traZODone 50 MG tablet Commonly known as: DESYREL   Vitamin D (Ergocalciferol) 1.25 MG (50000 UNIT) Caps capsule Commonly known as: DRISDOL       TAKE these medications    acetaminophen 325 MG tablet Commonly known as: TYLENOL Take 2 tablets (650 mg total) by mouth every 6 (six) hours as needed for mild pain  (or Fever >/= 101).   atorvastatin 40 MG tablet Commonly known as: LIPITOR Take 1 tablet (40 mg total) by mouth daily.   B-D UF III MINI PEN NEEDLES 31G X 5 MM Misc Generic drug: Insulin Pen Needle   B-D UF III MINI PEN NEEDLES 31G X 5 MM Misc Generic drug: Insulin Pen Needle USE 1 PEN NEEDLE IN THE MORNING AND AT BEDTIME   dapagliflozin propanediol 10 MG Tabs tablet Commonly known as: FARXIGA Take 1 tablet (10 mg total) by mouth daily.   feeding supplement Liqd Take 237 mLs by mouth daily.   FreeStyle Libre 2 Sensor Misc 1 Act by Does not apply route daily.   furosemide 40 MG tablet Commonly known as: LASIX Take 1 tablet (40 mg total) by mouth daily.   insulin aspart 100 UNIT/ML injection Commonly known as: novoLOG Inject 0-15 Units into the skin 3 (three) times daily with meals. CBG 70 - 120: 0 units CBG 121 - 150: 2 units CBG 151 - 200: 3 units CBG 201 - 250: 5 units CBG 251 - 300: 8 units CBG 301 - 350: 11 units CBG 351 - 400: 15 units CBG > 400: call MD and obtain STAT lab verification Replaces: insulin aspart 100 UNIT/ML FlexPen   insulin glargine-yfgn 100 UNIT/ML injection Commonly known as: SEMGLEE Inject 0.2 mLs (20 Units total) into the skin at bedtime.   ipratropium-albuterol 0.5-2.5 (3) MG/3ML Soln Commonly known as: DUONEB Take 3 mLs by nebulization every 4 (four) hours as needed.   metFORMIN 500 MG tablet Commonly known as: GLUCOPHAGE Take 1 tablet (500 mg total) by mouth 2 (two) times daily with a meal.   mometasone-formoterol 200-5 MCG/ACT Aero Commonly known as: DULERA Inhale 2 puffs into the lungs 2 (two) times daily.   polyethylene glycol 17 g packet Commonly known as: MIRALAX / GLYCOLAX Take 17 g by mouth 2 (two) times daily as needed.   rivaroxaban 20 MG Tabs tablet Commonly known as: Xarelto Take 1 tablet (20 mg total) by mouth daily.   senna-docusate 8.6-50 MG tablet Commonly known as: Senokot-S Take 1 tablet by mouth 2 (two) times  daily between meals as needed for mild constipation.        Consultations: Critical care admitted patient.  Procedures/Studies: Intubation and mechanical ventilation Right chest tube    DG Shoulder Right Result Date: 02/10/2024 CLINICAL DATA:  Right shoulder pain. EXAM: RIGHT SHOULDER - 2+ VIEW COMPARISON:  None Available. FINDINGS: Three views submitted, 1 labeled portable. The mineralization and alignment are normal. There is no evidence of acute fracture or dislocation. Mild glenohumeral  and acromioclavicular degenerative changes. No significant narrowing of the subacromial space or other focal soft tissue abnormality identified. IMPRESSION: Mild degenerative changes without acute osseous findings. Electronically Signed   By: Carey Bullocks M.D.   On: 02/10/2024 14:37   DG CHEST PORT 1 VIEW Result Date: 02/01/2024 CLINICAL DATA:  Chest tube removal EXAM: PORTABLE CHEST 1 VIEW COMPARISON:  01/30/2024 FINDINGS: 2 frontal views of the chest demonstrate interval removal of the right-sided pigtail drainage catheter, enteric catheter, and endotracheal tube. Stable right subclavian central venous catheter. Cardiac silhouette is unchanged. There is increased consolidation at the left lung base, with small left pleural effusion identified. Stable patchy consolidation at the right lung base. No evidence of pneumothorax. IMPRESSION: 1. No evidence of recurrent or residual right pneumothorax after right-sided chest tube removal. 2. Increased left basilar consolidation after extubation. Stable patchy right basilar consolidation. 3. Trace left pleural effusion. Electronically Signed   By: Sharlet Salina M.D.   On: 02/01/2024 20:30   VAS [place] LOWER EXTREMITY VENOUS (DVT) Result Date: 01/30/2024  Lower Venous DVT Study Patient Name:  KYLLIAN CLINGERMAN  Date of Exam:   01/30/2024 Medical Rec #: [id number]        Accession #:    [id number] Date of Birth: 1941-05-22         Patient Gender: M Patient Age:   44 years  Exam Location:  [facility] Procedure:      VAS [place] LOWER EXTREMITY VENOUS (DVT) Referring Phys: Levon Hedger --------------------------------------------------------------------------------  Indications: Edema.  Risk Factors: None identified. Limitations: Poor ultrasound/tissue interface. Comparison Study: None. Performing Technologist: Shona Simpson  Examination Guidelines: A complete evaluation includes B-mode imaging, spectral Doppler, color Doppler, and power Doppler as needed of all accessible portions of each vessel. Bilateral testing is considered an integral part of a complete examination. Limited examinations for reoccurring indications may be performed as noted. The reflux portion of the exam is performed with the patient in reverse Trendelenburg.  +---------+---------------+---------+-----------+----------+---------------+ RIGHT    CompressibilityPhasicitySpontaneityPropertiesThrombus Aging  +---------+---------------+---------+-----------+----------+---------------+ CFV      Full           Yes      Yes                                  +---------+---------------+---------+-----------+----------+---------------+ SFJ      Full                                                         +---------+---------------+---------+-----------+----------+---------------+ FV Prox  Full                                                         +---------+---------------+---------+-----------+----------+---------------+ FV Mid   Full                                                         +---------+---------------+---------+-----------+----------+---------------+ FV DistalFull                                                         +---------+---------------+---------+-----------+----------+---------------+  PFV      Full                                                         +---------+---------------+---------+-----------+----------+---------------+ POP      Full            Yes      Yes                                  +---------+---------------+---------+-----------+----------+---------------+ PTV      Full                                                         +---------+---------------+---------+-----------+----------+---------------+ PERO                                                  Patent by color +---------+---------------+---------+-----------+----------+---------------+   +---------+---------------+---------+-----------+----------+---------------+ LEFT     CompressibilityPhasicitySpontaneityPropertiesThrombus Aging  +---------+---------------+---------+-----------+----------+---------------+ CFV      Full           Yes      Yes                                  +---------+---------------+---------+-----------+----------+---------------+ SFJ      Full                                                         +---------+---------------+---------+-----------+----------+---------------+ FV Prox  Full                                                         +---------+---------------+---------+-----------+----------+---------------+ FV Mid   Full                                                         +---------+---------------+---------+-----------+----------+---------------+ FV DistalFull                                                         +---------+---------------+---------+-----------+----------+---------------+ PFV      Full                                                         +---------+---------------+---------+-----------+----------+---------------+  POP      Full           Yes      Yes                                  +---------+---------------+---------+-----------+----------+---------------+ PTV      Full                                                         +---------+---------------+---------+-----------+----------+---------------+ PERO                                                   Patent by color +---------+---------------+---------+-----------+----------+---------------+     Summary: BILATERAL: - No evidence of deep vein thrombosis seen in the lower extremities, bilaterally. -No evidence of popliteal cyst, bilaterally.   *See table(s) above for measurements and observations. Electronically signed by Gerarda Fraction on 01/30/2024 at 5:35:56 PM.    Final    ECHOCARDIOGRAM LIMITED Result Date: 01/30/2024    ECHOCARDIOGRAM LIMITED REPORT   Patient Name:   CORIN TILLY Date of Exam: 01/30/2024 Medical Rec #:  [id number]       Height:       77.0 in Accession #:    [id number]      Weight:       274.3 lb Date of Birth:  [birth year]        BSA:          2.558 m Patient Age:    82 years        BP:           98/46 mmHg Patient Gender: M               HR:           68 bpm. Exam Location:  Inpatient Procedure: Limited Echo, Cardiac Doppler and Color Doppler (Both Spectral and            Color Flow Doppler were utilized during procedure). Indications:    CHF  History:        Patient has prior history of Echocardiogram examinations, most                 recent 04/27/2023. PAD and COPD, Arrythmias:Atrial Fibrillation;                 Risk Factors:Diabetes.  Sonographer:    Amy Chionchio Referring Phys: Nigel Mormon HARRIS IMPRESSIONS  1. Left ventricular ejection fraction, by estimation, is 55 to 60%. The left ventricle has normal function. There is moderate concentric left ventricular hypertrophy. D-shaped septum.  2. Right ventricular systolic function is moderately reduced. The right ventricular size is moderately enlarged.  3. Left atrial size was moderately dilated.  4. Right atrial size was moderately dilated.  5. There is severe mitral stenosis with a mean gradient of secondary to rheumatic degeneration of the mitral valve. The valve is thickened & calcified with restricted excursion of the valve leaflets. There is also moderate eccentric posteriorly directed MR that is not well  visualized. MVA is less than 1.5cm. Heart rate 72BPM.  6. The  aortic valve has an indeterminant number of cusps. There is moderate calcification of the aortic valve. There is moderate thickening of the aortic valve. Aortic valve area, by VTI measures 1.70 cm. Aortic valve mean gradient measures 12.0 mmHg.  7. The inferior vena cava is dilated in size with <50% respiratory variability, suggesting right atrial pressure of 15 mmHg. FINDINGS  Left Ventricle: Left ventricular ejection fraction, by estimation, is 55 to 60%. The left ventricle has normal function. The left ventricular internal cavity size was small. There is moderate concentric left ventricular hypertrophy. Left ventricular diastolic function could not be evaluated. Left ventricular diastolic function could not be evaluated due to indeterminate diastolic function. Right Ventricle: The right ventricular size is moderately enlarged. Right ventricular systolic function is moderately reduced. Left Atrium: Left atrial size was moderately dilated. Right Atrium: Right atrial size was moderately dilated. Mitral Valve: The mitral valve is rheumatic. There is moderate thickening of the mitral valve leaflet(s). There is moderate calcification of the mitral valve leaflet(s). Mild to moderate mitral annular calcification. Moderate mitral valve regurgitation. Severe mitral valve stenosis. MV peak gradient, 31.8 mmHg. The mean mitral valve gradient is 18.0 mmHg. Aortic Valve: The aortic valve has an indeterminant number of cusps. There is moderate calcification of the aortic valve. There is moderate thickening of the aortic valve. There is moderate aortic valve annular calcification. Aortic valve mean gradient measures 12.0 mmHg. Aortic valve peak gradient measures 19.6 mmHg. Aortic valve area, by VTI measures 1.70 cm. Venous: The inferior vena cava is dilated in size with less than 50% respiratory variability, suggesting right atrial pressure of 15 mmHg. LEFT  VENTRICLE PLAX 2D LVIDd:         3.60 cm LVIDs:         2.70 cm LV PW:         1.40 cm LV IVS:        1.50 cm LVOT diam:     2.20 cm LV SV:         62 LV SV Index:   24 LVOT Area:     3.80 cm  LV Volumes (MOD) LV vol d, MOD A4C: 87.7 ml LV vol s, MOD A4C: 43.5 ml LV SV MOD A4C:     87.7 ml RIGHT VENTRICLE          IVC RV Basal diam:  5.20 cm  IVC diam: 3.10 cm RV Mid diam:    4.50 cm TAPSE (M-mode): 1.4 cm LEFT ATRIUM            Index        RIGHT ATRIUM           Index LA Vol (A4C): 105.0 ml 41.05 ml/m  RA Area:     26.60 cm                                     RA Volume:   93.80 ml  36.67 ml/m  AORTIC VALVE AV Area (Vmax):    1.66 cm AV Area (Vmean):   1.70 cm AV Area (VTI):     1.70 cm AV Vmax:           221.50 cm/s AV Vmean:          153.000 cm/s AV VTI:            0.362 m AV Peak Grad:      19.6 mmHg AV Mean  Grad:      12.0 mmHg LVOT Vmax:         96.60 cm/s LVOT Vmean:        68.500 cm/s LVOT VTI:          0.162 m LVOT/AV VTI ratio: 0.45  AORTA Ao Root diam: 3.70 cm Ao Asc diam:  3.70 cm MITRAL VALVE MV Area (PHT): 1.79 cm    SHUNTS MV Area VTI:   0.61 cm    Systemic VTI:  0.16 m MV Peak grad:  31.8 mmHg   Systemic Diam: 2.20 cm MV Mean grad:  18.0 mmHg MV Vmax:       2.82 m/s MV Vmean:      189.3 cm/s Aditya Sabharwal Electronically signed by Dorthula Nettles Signature Date/Time: 01/30/2024/10:42:35 AM    Final    DG Chest Port 1 View Result Date: 01/30/2024 CLINICAL DATA:  Central line placement EXAM: PORTABLE CHEST 1 VIEW COMPARISON:  Yesterday FINDINGS: Right subclavian line with tip at the SVC. Endotracheal tube with tip just below the clavicular heads. An enteric tube reaches the stomach. A left neck catheter has been removed. Unchanged chest tube positioning on the right. Improved inflation. Trace right apical pneumothorax. Normal heart size. IMPRESSION: 1. Remaining hardware in stable position. 2. Improved inflation; trace right apical pneumothorax. Electronically Signed   By: Tiburcio Pea  M.D.   On: 01/30/2024 05:26   CT HEAD WO CONTRAST ( ) Result Date: 01/29/2024 CLINICAL DATA:  Delirium EXAM: CT HEAD WITHOUT CONTRAST TECHNIQUE: Contiguous axial images were obtained from the base of the skull through the vertex without intravenous contrast. RADIATION DOSE REDUCTION: This exam was performed according to the departmental dose-optimization program which includes automated exposure control, adjustment of the mA and/or kV according to patient size and/or use of iterative reconstruction technique. COMPARISON:  None Available. FINDINGS: Brain: No mass,hemorrhage or extra-axial collection. Normal appearance of the parenchyma and CSF spaces. Vascular: No hyperdense vessel or unexpected vascular calcification. Skull: The visualized skull base, calvarium and extracranial soft tissues are normal. Sinuses/Orbits: No fluid levels or advanced mucosal thickening of the visualized paranasal sinuses. No mastoid or middle ear effusion. Normal orbits. Other: None. IMPRESSION: Normal head CT. Electronically Signed   By: Deatra Robinson M.D.   On: 01/29/2024 22:27   DG CHEST PORT 1 VIEW Result Date: 01/29/2024 CLINICAL DATA:  [id number] Encounter for central line placement [id number] EXAM: PORTABLE CHEST 1 VIEW COMPARISON:  Same day chest x-ray FINDINGS: Interval placement of right subclavian approach central venous catheter with distal tip terminating at the distal SVC. Left IJ central line is again noted to be malpositioned with catheter following the course of the left subclavian vein and terminating in the expected location of the left axillary vein. Endotracheal and enteric tubes remain appropriately positioned. Right chest tube in place. Stable heart size. Patchy bilateral airspace opacities and small bilateral pleural effusions, unchanged. No pneumothorax. IMPRESSION: 1. Interval placement of right subclavian approach central venous catheter with distal tip terminating at the distal SVC. No pneumothorax. 2. Left IJ  central line is again noted to be malpositioned with catheter following the course of the left subclavian vein and terminating in the expected location of the left axillary vein. Repositioning is recommended. Electronically Signed   By: Duanne Guess D.O.   On: 01/29/2024 19:11   DG Chest 1 View Result Date: 01/29/2024 CLINICAL DATA:  Central line placement EXAM: CHEST  1 VIEW COMPARISON:  Radiograph same day FINDINGS: LEFT central venous line from  IJ approach extends into the LEFT subclavian vein towards the axilla. Endotracheal tube and NG tube unchanged. Bilateral pleural effusions. RIGHT chest tube in place. No pneumothorax. IMPRESSION: 1. LEFT internal jugular catheter extends into the LEFT axilla through the subclavian vein. 2. Stable support apparatus otherwise. Electronically Signed   By: Genevive Bi M.D.   On: 01/29/2024 18:33   DG CHEST PORT 1 VIEW Result Date: 01/29/2024 CLINICAL DATA:  Central line placement EXAM: PORTABLE CHEST 1 VIEW COMPARISON:  01/29/2024 FINDINGS: High endotracheal 2 is unchanged. No Central line definitively seen. If the central line was placed in the left internal jugular, there appears to be a catheter that loops in the left side of the neck, only partially visualized. Right chest tube is noted. Chest is partially visualized with the lower lobes cut off the image. Bilateral perihilar and lower lobe airspace disease noted. IMPRESSION: Possible left internal jugular central line coiling in the left side of the neck, only partially visualized. The entire chest is not visualized/imaged. Right chest tube partially visualized with bilateral perihilar and lower lobe airspace opacities. No visible pneumothorax. Electronically Signed   By: Charlett Nose M.D.   On: 01/29/2024 18:02   DG Abd Portable 1 View Result Date: 01/29/2024 CLINICAL DATA:  OG placement verification EXAM: PORTABLE ABDOMEN - 1 VIEW COMPARISON:  None Available. FINDINGS: The bowel gas pattern is  non-obstructive. No evidence of pneumoperitoneum, within the limitations of a supine film. No acute osseous abnormalities. The soft tissues are within normal limits. Surgical changes, devices, tubes and lines: Enteric tube is seen with its tip and side hole overlying the left upper quadrant, over the region of fundus/cardia of the stomach. Consider further advancement of the tube by 2-4 inches. IMPRESSION: Enteric tube is seen with its tip and side hole overlying the left upper quadrant, over the region of fundus/cardia of the stomach. Consider further advancement of the tube by 2-4 inches. Electronically Signed   By: Jules Schick M.D.   On: 01/29/2024 15:52   DG Chest Portable 1 View Result Date: 01/29/2024 CLINICAL DATA:  Respiratory distress. Low oxygen saturation. Brief loss of consciousness. EXAM: PORTABLE CHEST 1 VIEW COMPARISON:  04/26/2023. FINDINGS: Low lung volume. There is small-to-moderate layering right pleural effusion. There are probable atelectatic changes at the right lung base. No significant left pleural effusion. No dense consolidation or lung collapse. Stable cardio-mediastinal silhouette. No acute osseous abnormalities. The soft tissues are within normal limits. Endotracheal tube tip is 4.4 cm from the carina. An enteric tube extends below diaphragm, tip out of the view of the radiograph. IMPRESSION: Small-to-moderate layering right pleural effusion. Probable atelectatic changes at the right lung base. Electronically Signed   By: Jules Schick M.D.   On: 01/29/2024 15:51       The results of significant diagnostics from this hospitalization (including imaging, microbiology, ancillary and laboratory) are listed below for reference.     Microbiology: No results found for this or any previous visit (from the past 240 hours).   Labs:  CBC: Recent Labs  Lab 02/06/24 1127 02/08/24 1808  WBC 5.3 5.2  HGB 10.1* 9.4*  HCT 33.6* 31.7*  MCV 89.6 89.3  PLT 327 277   BMP  &GFR Recent Labs  Lab 02/05/24 0737 02/06/24 1127 02/08/24 1808  NA 140 139 138  K 3.7 3.6 4.4  CL 103 102 103  CO2 27 27 23   GLUCOSE 139* 193* 236*  BUN 17 15 15   CREATININE 1.13 1.18 1.21  CALCIUM 9.0 9.3 9.0  MG 2.0 2.2 2.1  PHOS 3.5 4.0 3.3   Estimated Creatinine Clearance: 70.6 mL/min (by C-G formula based on SCr of 1.21 mg/dL). Liver & Pancreas: Recent Labs  Lab 02/05/24 0737 02/06/24 1127 02/08/24 1808  ALBUMIN 2.8* 3.4* 3.1*   No results for input(s): "LIPASE", "AMYLASE" in the last 168 hours. No results for input(s): "AMMONIA" in the last 168 hours. Diabetic: No results for input(s): "HGBA1C" in the last 72 hours. Recent Labs  Lab 02/10/24 0807 02/10/24 1140 02/10/24 1653 02/10/24 2124 02/11/24 0609  GLUCAP 142* 210* 248* 137* 130*   Cardiac Enzymes: No results for input(s): "CKTOTAL", "CKMB", "CKMBINDEX", "TROPONINI" in the last 168 hours. No results for input(s): "PROBNP" in the last 8760 hours. Coagulation Profile: No results for input(s): "INR", "PROTIME" in the last 168 hours. Thyroid Function Tests: No results for input(s): "TSH", "T4TOTAL", "FREET4", "T3FREE", "THYROIDAB" in the last 72 hours. Lipid Profile: No results for input(s): "CHOL", "HDL", "LDLCALC", "TRIG", "CHOLHDL", "LDLDIRECT" in the last 72 hours. Anemia Panel: No results for input(s): "VITAMINB12", "FOLATE", "FERRITIN", "TIBC", "IRON", "RETICCTPCT" in the last 72 hours. Urine analysis:    Component Value Date/Time   COLORURINE AMBER (A) 01/29/2024 1529   APPEARANCEUR HAZY (A) 01/29/2024 1529   LABSPEC 1.019 01/29/2024 1529   PHURINE 5.0 01/29/2024 1529   GLUCOSEU NEGATIVE 01/29/2024 1529   GLUCOSEU >=1000 (A) 03/29/2023 1412   HGBUR NEGATIVE 01/29/2024 1529   BILIRUBINUR NEGATIVE 01/29/2024 1529   BILIRUBINUR neg 03/21/2018 0940   KETONESUR NEGATIVE 01/29/2024 1529   PROTEINUR 100 (A) 01/29/2024 1529   UROBILINOGEN 0.2 03/29/2023 1412   NITRITE NEGATIVE 01/29/2024 1529    LEUKOCYTESUR NEGATIVE 01/29/2024 1529   Sepsis Labs: Invalid input(s): "PROCALCITONIN", "LACTICIDVEN"   SIGNED:  Almon Hercules, MD  [facility] 02/11/2024, 7:18 AM

## 2024-02-11 NOTE — TOC Transition Note (Signed)
 Transition of Care [facility]) - Discharge Note   Patient Details  Name: Jason [name] MRN: [id number] Date of Birth: January 20, 1941  [facility] CM/SW Contact:  Deatra Robinson, LCSW Phone Number: 02/11/2024, 9:41 AM   Clinical Narrative: Pt for dc to [place] today. Spoke to [place] in admissions who confirmed they are prepared to admit pt to room 216. Pt's dtr [name] aware of dc and reports agreeable. RN provided with number for report and [facility] arranged for transport. SW signing off at dc.   Dellie Burns, MSW, LCSW [phone] (coverage)        Final next level of care: Skilled Nursing Facility Barriers to Discharge: Barriers Resolved   Patient Goals and CMS Choice   CMS Medicare.gov Compare Post Acute Care list provided to:: Patient Represenative (must comment) Choice offered to / list presented to : [facility] POA / Guardian      Discharge Placement                Patient to be transferred to facility by: [facility] Name of family member notified: [name]/Dtr Patient and family notified of of transfer: 02/11/24  Discharge Plan and Services Additional resources added to the After Visit Summary for   In-house Referral: Clinical Social Work   Post Acute Care Choice: Skilled Nursing Facility                               Social Drivers of Health (SDOH) Interventions SDOH Screenings   Food Insecurity: No Food Insecurity (04/26/2023)  Housing: Low Risk  (02/01/2024)  Transportation Needs: Unmet Transportation Needs (02/01/2024)  Utilities: Not At Risk (02/01/2024)  Alcohol Screen: Low Risk  (06/01/2022)  Depression (PHQ2-9): Low Risk  (06/01/2022)  Financial Resource Strain: Low Risk  (06/01/2022)  Physical Activity: Sufficiently Active (06/01/2022)  Social Connections: Socially Isolated (02/01/2024)  Stress: Stress Concern Present (06/01/2022)  Tobacco Use: Medium Risk (01/29/2024)     Readmission Risk Interventions    04/30/2023   12:25 PM  Readmission  Risk Prevention Plan  Transportation Screening Complete  PCP or Specialist Appt within 3-5 Days Complete  HRI or Home Care Consult Complete  Social Work Consult for Recovery Care Planning/Counseling Complete  Palliative Care Screening Complete  Medication Review Oceanographer) Complete

## 2024-02-11 NOTE — Progress Notes (Signed)
 [facility] here to transfer pt to Rehab

## 2024-02-11 NOTE — Progress Notes (Signed)
 Attempted to give report to [place] ,[phone] went to voicemail. I attempted 2 more times still no answer, voicemail again. Family aware of transfer, spoke daughter [name] she will meet him there. [phone]

## 2024-04-05 ENCOUNTER — Telehealth: Payer: Self-pay

## 2024-04-05 NOTE — Telephone Encounter (Signed)
 Copied from CRM [id number]. Topic: Clinical - Order For Equipment >> Apr 05, 2024  9:39 AM Marlan Silva wrote: Reason for CRM: Howell Macintosh called from medical supply states that she needs updated office notes in order to keep supplying the patient their free style libre. Call back number for medical supply is [phone].

## 2024-04-06 ENCOUNTER — Other Ambulatory Visit: Payer: Self-pay | Admitting: Internal Medicine

## 2024-04-06 DIAGNOSIS — E118 Type 2 diabetes mellitus with unspecified complications: Secondary | ICD-10-CM

## 2024-04-06 DIAGNOSIS — E1151 Type 2 diabetes mellitus with diabetic peripheral angiopathy without gangrene: Secondary | ICD-10-CM

## 2024-04-06 NOTE — Telephone Encounter (Signed)
 Copied from CRM [id number]. Topic: Clinical - Prescription Issue >> Apr 06, 2024 11:15 AM Lajean Pike wrote: Reason for CRM: [facility] called in for patient. They requested a script be sent in for patient's continuous glucose sensor. They stated to not have received it. They provided contact number: [phone]. Fax number: [phone].   Continuous Glucose Sensor (FREESTYLE LIBRE 2 SENSOR) MISC

## 2024-04-18 ENCOUNTER — Other Ambulatory Visit: Payer: Self-pay | Admitting: Internal Medicine

## 2024-05-23 ENCOUNTER — Telehealth: Payer: Self-pay | Admitting: Internal Medicine

## 2024-05-23 NOTE — Telephone Encounter (Unsigned)
 Copied from CRM [id number]. Topic: Clinical - Medication Prior Auth >> May 23, 2024 10:40 AM Gennette ORN wrote: Reason for CRM:  [facility] [phone] needing diabetes notes for the patient to confirm and she also fax a form to fill out for prior authorization.

## 2024-06-26 ENCOUNTER — Telehealth: Payer: Self-pay | Admitting: Internal Medicine

## 2024-06-26 NOTE — Telephone Encounter (Unsigned)
 Copied from CRM [id number]. Topic: General - Other >> Jun 26, 2024  2:51 PM Gennette ORN wrote: Reason for CRM: Jason [name]  [facility] [phone] ext [phone]  calling to request doctor notes.
